# Patient Record
Sex: Male | Born: 1955 | Race: White | Hispanic: No | Marital: Married | Smoking: Never smoker
Health system: Southern US, Community
[De-identification: ages and names within clinical notes are randomized; demographics above are authoritative.]

## PROBLEM LIST (undated history)

## (undated) ENCOUNTER — Emergency Department (HOSPITAL_BASED_OUTPATIENT_CLINIC_OR_DEPARTMENT_OTHER): Payer: BC Managed Care – PPO | Source: Home / Self Care

## (undated) DIAGNOSIS — I82409 Acute embolism and thrombosis of unspecified deep veins of unspecified lower extremity: Secondary | ICD-10-CM

## (undated) DIAGNOSIS — S72002S Fracture of unspecified part of neck of left femur, sequela: Secondary | ICD-10-CM

## (undated) DIAGNOSIS — M869 Osteomyelitis, unspecified: Secondary | ICD-10-CM

## (undated) DIAGNOSIS — I749 Embolism and thrombosis of unspecified artery: Secondary | ICD-10-CM

## (undated) DIAGNOSIS — N186 End stage renal disease: Secondary | ICD-10-CM

## (undated) DIAGNOSIS — N529 Male erectile dysfunction, unspecified: Secondary | ICD-10-CM

## (undated) DIAGNOSIS — N12 Tubulo-interstitial nephritis, not specified as acute or chronic: Secondary | ICD-10-CM

## (undated) DIAGNOSIS — A419 Sepsis, unspecified organism: Secondary | ICD-10-CM

## (undated) DIAGNOSIS — L97221 Non-pressure chronic ulcer of left calf limited to breakdown of skin: Secondary | ICD-10-CM

## (undated) DIAGNOSIS — I10 Essential (primary) hypertension: Secondary | ICD-10-CM

## (undated) DIAGNOSIS — Z89519 Acquired absence of unspecified leg below knee: Secondary | ICD-10-CM

## (undated) DIAGNOSIS — I739 Peripheral vascular disease, unspecified: Secondary | ICD-10-CM

## (undated) DIAGNOSIS — Z87442 Personal history of urinary calculi: Secondary | ICD-10-CM

## (undated) DIAGNOSIS — Z8739 Personal history of other diseases of the musculoskeletal system and connective tissue: Secondary | ICD-10-CM

## (undated) DIAGNOSIS — M1990 Unspecified osteoarthritis, unspecified site: Secondary | ICD-10-CM

## (undated) DIAGNOSIS — M86262 Subacute osteomyelitis, left tibia and fibula: Secondary | ICD-10-CM

## (undated) DIAGNOSIS — N400 Enlarged prostate without lower urinary tract symptoms: Secondary | ICD-10-CM

## (undated) DIAGNOSIS — Z5189 Encounter for other specified aftercare: Secondary | ICD-10-CM

## (undated) DIAGNOSIS — K5730 Diverticulosis of large intestine without perforation or abscess without bleeding: Secondary | ICD-10-CM

## (undated) DIAGNOSIS — F419 Anxiety disorder, unspecified: Secondary | ICD-10-CM

## (undated) DIAGNOSIS — M5126 Other intervertebral disc displacement, lumbar region: Secondary | ICD-10-CM

## (undated) DIAGNOSIS — L03116 Cellulitis of left lower limb: Secondary | ICD-10-CM

## (undated) DIAGNOSIS — K859 Acute pancreatitis without necrosis or infection, unspecified: Secondary | ICD-10-CM

## (undated) DIAGNOSIS — K8020 Calculus of gallbladder without cholecystitis without obstruction: Secondary | ICD-10-CM

## (undated) DIAGNOSIS — G8929 Other chronic pain: Secondary | ICD-10-CM

## (undated) DIAGNOSIS — D649 Anemia, unspecified: Secondary | ICD-10-CM

## (undated) DIAGNOSIS — N189 Chronic kidney disease, unspecified: Secondary | ICD-10-CM

## (undated) DIAGNOSIS — E039 Hypothyroidism, unspecified: Secondary | ICD-10-CM

## (undated) DIAGNOSIS — IMO0001 Reserved for inherently not codable concepts without codable children: Secondary | ICD-10-CM

## (undated) DIAGNOSIS — N200 Calculus of kidney: Secondary | ICD-10-CM

## (undated) DIAGNOSIS — M009 Pyogenic arthritis, unspecified: Secondary | ICD-10-CM

## (undated) DIAGNOSIS — G4730 Sleep apnea, unspecified: Secondary | ICD-10-CM

## (undated) DIAGNOSIS — E785 Hyperlipidemia, unspecified: Secondary | ICD-10-CM

## (undated) DIAGNOSIS — I890 Lymphedema, not elsewhere classified: Secondary | ICD-10-CM

## (undated) DIAGNOSIS — J309 Allergic rhinitis, unspecified: Secondary | ICD-10-CM

## (undated) HISTORY — DX: Cellulitis of left lower limb: L03.116

## (undated) HISTORY — DX: Other intervertebral disc displacement, lumbar region: M51.26

## (undated) HISTORY — DX: Allergic rhinitis, unspecified: J30.9

## (undated) HISTORY — DX: Calculus of gallbladder without cholecystitis without obstruction: K80.20

## (undated) HISTORY — PX: KNEE SURGERY: SHX244

## (undated) HISTORY — DX: Diverticulosis of large intestine without perforation or abscess without bleeding: K57.30

## (undated) HISTORY — DX: Sepsis, unspecified organism: A41.9

## (undated) HISTORY — PX: KIDNEY SURGERY: SHX687

## (undated) HISTORY — DX: Non-pressure chronic ulcer of left calf limited to breakdown of skin: L97.221

## (undated) HISTORY — DX: Male erectile dysfunction, unspecified: N52.9

## (undated) HISTORY — DX: Pyogenic arthritis, unspecified: M00.9

## (undated) HISTORY — PX: OTHER SURGICAL HISTORY: SHX169

## (undated) HISTORY — DX: Personal history of other diseases of the musculoskeletal system and connective tissue: Z87.39

## (undated) HISTORY — DX: Personal history of urinary calculi: Z87.442

## (undated) HISTORY — DX: Benign prostatic hyperplasia without lower urinary tract symptoms: N40.0

## (undated) HISTORY — DX: Acute embolism and thrombosis of unspecified deep veins of unspecified lower extremity: I82.409

## (undated) HISTORY — DX: Subacute osteomyelitis, left tibia and fibula: M86.262

## (undated) HISTORY — PX: LEG AMPUTATION BELOW KNEE: SHX694

## (undated) HISTORY — PX: TONSILLECTOMY: SUR1361

## (undated) HISTORY — DX: Acute pancreatitis without necrosis or infection, unspecified: K85.90

## (undated) HISTORY — DX: Embolism and thrombosis of unspecified artery: I74.9

## (undated) HISTORY — PX: REPLACEMENT TOTAL KNEE: SUR1224

## (undated) HISTORY — DX: Lymphedema, not elsewhere classified: I89.0

## (undated) HISTORY — DX: Fracture of unspecified part of neck of left femur, sequela: S72.002S

## (undated) HISTORY — DX: Acquired absence of unspecified leg below knee: Z89.519

---
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
## 1997-07-13 ENCOUNTER — Emergency Department (HOSPITAL_COMMUNITY): Admission: EM | Admit: 1997-07-13 | Discharge: 1997-07-13 | Payer: Self-pay | Admitting: Emergency Medicine

## 1998-09-22 ENCOUNTER — Emergency Department (HOSPITAL_COMMUNITY): Admission: EM | Admit: 1998-09-22 | Discharge: 1998-09-22 | Payer: Self-pay | Admitting: Emergency Medicine

## 2000-08-29 ENCOUNTER — Emergency Department (HOSPITAL_COMMUNITY): Admission: EM | Admit: 2000-08-29 | Discharge: 2000-08-29 | Payer: Self-pay | Admitting: Emergency Medicine

## 2000-08-29 ENCOUNTER — Encounter: Payer: Self-pay | Admitting: *Deleted

## 2002-03-06 ENCOUNTER — Encounter: Payer: Self-pay | Admitting: Orthopedic Surgery

## 2002-03-07 ENCOUNTER — Encounter: Payer: Self-pay | Admitting: Orthopedic Surgery

## 2002-03-07 ENCOUNTER — Inpatient Hospital Stay (HOSPITAL_COMMUNITY): Admission: RE | Admit: 2002-03-07 | Discharge: 2002-03-13 | Payer: Self-pay | Admitting: Orthopedic Surgery

## 2002-03-13 ENCOUNTER — Inpatient Hospital Stay (HOSPITAL_COMMUNITY)
Admission: RE | Admit: 2002-03-13 | Discharge: 2002-03-21 | Payer: Self-pay | Admitting: Physical Medicine & Rehabilitation

## 2002-07-17 ENCOUNTER — Encounter: Payer: Self-pay | Admitting: Orthopedic Surgery

## 2002-07-17 ENCOUNTER — Inpatient Hospital Stay (HOSPITAL_COMMUNITY): Admission: RE | Admit: 2002-07-17 | Discharge: 2002-07-25 | Payer: Self-pay | Admitting: Orthopedic Surgery

## 2003-03-21 ENCOUNTER — Emergency Department (HOSPITAL_COMMUNITY): Admission: EM | Admit: 2003-03-21 | Discharge: 2003-03-21 | Payer: Self-pay | Admitting: *Deleted

## 2003-03-22 ENCOUNTER — Emergency Department (HOSPITAL_COMMUNITY): Admission: EM | Admit: 2003-03-22 | Discharge: 2003-03-22 | Payer: Self-pay | Admitting: *Deleted

## 2003-03-23 ENCOUNTER — Encounter (HOSPITAL_COMMUNITY): Admission: RE | Admit: 2003-03-23 | Discharge: 2003-06-21 | Payer: Self-pay | Admitting: Internal Medicine

## 2004-09-01 ENCOUNTER — Ambulatory Visit (HOSPITAL_COMMUNITY): Admission: RE | Admit: 2004-09-01 | Discharge: 2004-09-01 | Payer: Self-pay | Admitting: Family Medicine

## 2004-10-26 ENCOUNTER — Ambulatory Visit (HOSPITAL_COMMUNITY): Admission: RE | Admit: 2004-10-26 | Discharge: 2004-10-26 | Payer: Self-pay | Admitting: Family Medicine

## 2005-07-17 ENCOUNTER — Emergency Department (HOSPITAL_COMMUNITY): Admission: EM | Admit: 2005-07-17 | Discharge: 2005-07-17 | Payer: Self-pay | Admitting: Family Medicine

## 2005-12-18 ENCOUNTER — Inpatient Hospital Stay (HOSPITAL_COMMUNITY): Admission: AD | Admit: 2005-12-18 | Discharge: 2005-12-25 | Payer: Self-pay | Admitting: Family Medicine

## 2005-12-20 ENCOUNTER — Encounter: Payer: Self-pay | Admitting: Internal Medicine

## 2005-12-21 ENCOUNTER — Ambulatory Visit: Payer: Self-pay | Admitting: Internal Medicine

## 2007-03-04 ENCOUNTER — Encounter: Admission: RE | Admit: 2007-03-04 | Discharge: 2007-03-04 | Payer: Self-pay | Admitting: Occupational Medicine

## 2007-03-12 ENCOUNTER — Encounter: Admission: RE | Admit: 2007-03-12 | Discharge: 2007-03-12 | Payer: Self-pay | Admitting: Internal Medicine

## 2007-08-15 ENCOUNTER — Ambulatory Visit (HOSPITAL_COMMUNITY): Admission: RE | Admit: 2007-08-15 | Discharge: 2007-08-15 | Payer: Self-pay | Admitting: Urology

## 2007-08-15 ENCOUNTER — Encounter (INDEPENDENT_AMBULATORY_CARE_PROVIDER_SITE_OTHER): Payer: Self-pay | Admitting: Interventional Radiology

## 2007-10-01 ENCOUNTER — Encounter (INDEPENDENT_AMBULATORY_CARE_PROVIDER_SITE_OTHER): Payer: Self-pay | Admitting: *Deleted

## 2007-10-01 ENCOUNTER — Telehealth (INDEPENDENT_AMBULATORY_CARE_PROVIDER_SITE_OTHER): Payer: Self-pay | Admitting: *Deleted

## 2007-10-01 ENCOUNTER — Ambulatory Visit: Payer: Self-pay | Admitting: Gastroenterology

## 2007-10-01 DIAGNOSIS — E1121 Type 2 diabetes mellitus with diabetic nephropathy: Secondary | ICD-10-CM

## 2007-10-01 DIAGNOSIS — I82409 Acute embolism and thrombosis of unspecified deep veins of unspecified lower extremity: Secondary | ICD-10-CM | POA: Insufficient documentation

## 2007-10-01 DIAGNOSIS — Z794 Long term (current) use of insulin: Secondary | ICD-10-CM

## 2007-10-03 ENCOUNTER — Encounter (INDEPENDENT_AMBULATORY_CARE_PROVIDER_SITE_OTHER): Payer: Self-pay | Admitting: *Deleted

## 2007-10-07 ENCOUNTER — Telehealth (INDEPENDENT_AMBULATORY_CARE_PROVIDER_SITE_OTHER): Payer: Self-pay | Admitting: *Deleted

## 2007-10-16 ENCOUNTER — Telehealth (INDEPENDENT_AMBULATORY_CARE_PROVIDER_SITE_OTHER): Payer: Self-pay | Admitting: *Deleted

## 2007-10-22 ENCOUNTER — Telehealth: Payer: Self-pay | Admitting: Gastroenterology

## 2007-11-15 ENCOUNTER — Emergency Department (HOSPITAL_COMMUNITY): Admission: EM | Admit: 2007-11-15 | Discharge: 2007-11-16 | Payer: Self-pay | Admitting: Emergency Medicine

## 2007-11-16 ENCOUNTER — Inpatient Hospital Stay (HOSPITAL_COMMUNITY): Admission: EM | Admit: 2007-11-16 | Discharge: 2007-11-22 | Payer: Self-pay | Admitting: Internal Medicine

## 2008-01-15 ENCOUNTER — Inpatient Hospital Stay (HOSPITAL_COMMUNITY): Admission: RE | Admit: 2008-01-15 | Discharge: 2008-01-21 | Payer: Self-pay | Admitting: Orthopedic Surgery

## 2008-02-18 ENCOUNTER — Ambulatory Visit: Payer: Self-pay

## 2008-02-19 ENCOUNTER — Emergency Department (HOSPITAL_COMMUNITY): Admission: EM | Admit: 2008-02-19 | Discharge: 2008-02-20 | Payer: Self-pay | Admitting: Emergency Medicine

## 2008-03-04 ENCOUNTER — Ambulatory Visit: Payer: Self-pay | Admitting: Oncology

## 2008-03-10 LAB — CBC WITH DIFFERENTIAL (CANCER CENTER ONLY)
BASO#: 0 10*3/uL (ref 0.0–0.2)
BASO%: 0.3 % (ref 0.0–2.0)
HCT: 27.5 % — ABNORMAL LOW (ref 38.7–49.9)
LYMPH%: 12.2 % — ABNORMAL LOW (ref 14.0–48.0)
MCV: 73 fL — ABNORMAL LOW (ref 82–98)
MONO#: 0.5 10*3/uL (ref 0.1–0.9)
NEUT%: 74.4 % (ref 40.0–80.0)
RDW: 16.1 % — ABNORMAL HIGH (ref 10.5–14.6)
WBC: 5 10*3/uL (ref 4.0–10.0)

## 2008-03-10 LAB — CMP (CANCER CENTER ONLY)
ALT(SGPT): 20 U/L (ref 10–47)
CO2: 30 mEq/L (ref 18–33)
Calcium: 9.4 mg/dL (ref 8.0–10.3)
Chloride: 99 mEq/L (ref 98–108)
Sodium: 140 mEq/L (ref 128–145)
Total Bilirubin: 0.8 mg/dl (ref 0.20–1.60)
Total Protein: 6.9 g/dL (ref 6.4–8.1)

## 2008-03-10 LAB — LACTATE DEHYDROGENASE: LDH: 175 U/L (ref 94–250)

## 2008-03-13 ENCOUNTER — Ambulatory Visit (HOSPITAL_COMMUNITY): Admission: RE | Admit: 2008-03-13 | Discharge: 2008-03-13 | Payer: Self-pay | Admitting: Oncology

## 2008-03-16 ENCOUNTER — Inpatient Hospital Stay (HOSPITAL_COMMUNITY): Admission: EM | Admit: 2008-03-16 | Discharge: 2008-03-19 | Payer: Self-pay | Admitting: Emergency Medicine

## 2008-03-16 ENCOUNTER — Ambulatory Visit: Payer: Self-pay | Admitting: Cardiology

## 2008-03-17 ENCOUNTER — Encounter (INDEPENDENT_AMBULATORY_CARE_PROVIDER_SITE_OTHER): Payer: Self-pay | Admitting: Internal Medicine

## 2008-03-23 ENCOUNTER — Ambulatory Visit (HOSPITAL_COMMUNITY): Admission: RE | Admit: 2008-03-23 | Discharge: 2008-03-23 | Payer: Self-pay | Admitting: Oncology

## 2008-03-23 ENCOUNTER — Encounter: Payer: Self-pay | Admitting: Oncology

## 2008-03-23 ENCOUNTER — Encounter (INDEPENDENT_AMBULATORY_CARE_PROVIDER_SITE_OTHER): Payer: Self-pay | Admitting: Diagnostic Radiology

## 2008-03-27 ENCOUNTER — Telehealth: Payer: Self-pay | Admitting: Gastroenterology

## 2008-03-30 ENCOUNTER — Encounter: Payer: Self-pay | Admitting: Gastroenterology

## 2008-03-30 ENCOUNTER — Encounter (INDEPENDENT_AMBULATORY_CARE_PROVIDER_SITE_OTHER): Payer: Self-pay | Admitting: *Deleted

## 2008-03-31 ENCOUNTER — Telehealth (INDEPENDENT_AMBULATORY_CARE_PROVIDER_SITE_OTHER): Payer: Self-pay | Admitting: *Deleted

## 2008-04-07 ENCOUNTER — Ambulatory Visit: Payer: Self-pay | Admitting: Gastroenterology

## 2008-04-07 ENCOUNTER — Telehealth (INDEPENDENT_AMBULATORY_CARE_PROVIDER_SITE_OTHER): Payer: Self-pay | Admitting: *Deleted

## 2008-04-16 ENCOUNTER — Ambulatory Visit: Payer: Self-pay | Admitting: Gastroenterology

## 2008-04-16 ENCOUNTER — Ambulatory Visit (HOSPITAL_COMMUNITY): Admission: RE | Admit: 2008-04-16 | Discharge: 2008-04-16 | Payer: Self-pay | Admitting: Gastroenterology

## 2008-08-12 ENCOUNTER — Emergency Department (HOSPITAL_COMMUNITY): Admission: EM | Admit: 2008-08-12 | Discharge: 2008-08-12 | Payer: Self-pay | Admitting: Emergency Medicine

## 2008-08-26 ENCOUNTER — Encounter: Admission: RE | Admit: 2008-08-26 | Discharge: 2008-08-26 | Payer: Self-pay | Admitting: Orthopedic Surgery

## 2008-09-14 ENCOUNTER — Emergency Department (HOSPITAL_BASED_OUTPATIENT_CLINIC_OR_DEPARTMENT_OTHER): Admission: EM | Admit: 2008-09-14 | Discharge: 2008-09-14 | Payer: Self-pay | Admitting: Emergency Medicine

## 2008-09-16 ENCOUNTER — Inpatient Hospital Stay (HOSPITAL_COMMUNITY): Admission: EM | Admit: 2008-09-16 | Discharge: 2008-09-21 | Payer: Self-pay | Admitting: Emergency Medicine

## 2008-09-18 ENCOUNTER — Encounter: Admission: RE | Admit: 2008-09-18 | Discharge: 2008-09-18 | Payer: Self-pay | Admitting: *Deleted

## 2008-09-21 ENCOUNTER — Ambulatory Visit: Payer: Self-pay | Admitting: Oncology

## 2008-09-25 ENCOUNTER — Ambulatory Visit (HOSPITAL_COMMUNITY): Admission: RE | Admit: 2008-09-25 | Discharge: 2008-09-25 | Payer: Self-pay | Admitting: Oncology

## 2008-10-01 ENCOUNTER — Encounter: Admission: RE | Admit: 2008-10-01 | Discharge: 2008-10-01 | Payer: Self-pay | Admitting: Orthopedic Surgery

## 2008-10-10 ENCOUNTER — Inpatient Hospital Stay (HOSPITAL_COMMUNITY): Admission: EM | Admit: 2008-10-10 | Discharge: 2008-10-18 | Payer: Self-pay | Admitting: Emergency Medicine

## 2008-10-14 ENCOUNTER — Ambulatory Visit: Payer: Self-pay | Admitting: Vascular Surgery

## 2008-10-14 ENCOUNTER — Encounter (INDEPENDENT_AMBULATORY_CARE_PROVIDER_SITE_OTHER): Payer: Self-pay | Admitting: Internal Medicine

## 2008-10-16 ENCOUNTER — Encounter (INDEPENDENT_AMBULATORY_CARE_PROVIDER_SITE_OTHER): Payer: Self-pay | Admitting: Internal Medicine

## 2008-10-16 ENCOUNTER — Ambulatory Visit: Payer: Self-pay | Admitting: Internal Medicine

## 2008-10-26 ENCOUNTER — Encounter (HOSPITAL_BASED_OUTPATIENT_CLINIC_OR_DEPARTMENT_OTHER): Admission: RE | Admit: 2008-10-26 | Discharge: 2009-01-24 | Payer: Self-pay | Admitting: Internal Medicine

## 2008-11-06 ENCOUNTER — Encounter: Admission: RE | Admit: 2008-11-06 | Discharge: 2008-11-06 | Payer: Self-pay | Admitting: Orthopedic Surgery

## 2008-11-18 ENCOUNTER — Emergency Department (HOSPITAL_COMMUNITY): Admission: EM | Admit: 2008-11-18 | Discharge: 2008-11-18 | Payer: Self-pay | Admitting: Emergency Medicine

## 2008-11-25 ENCOUNTER — Observation Stay (HOSPITAL_COMMUNITY): Admission: EM | Admit: 2008-11-25 | Discharge: 2008-11-27 | Payer: Self-pay | Admitting: Emergency Medicine

## 2008-11-30 ENCOUNTER — Encounter: Admission: RE | Admit: 2008-11-30 | Discharge: 2008-11-30 | Payer: Self-pay | Admitting: Orthopaedic Surgery

## 2008-12-03 ENCOUNTER — Encounter: Admission: RE | Admit: 2008-12-03 | Discharge: 2008-12-03 | Payer: Self-pay | Admitting: Family Medicine

## 2008-12-18 ENCOUNTER — Inpatient Hospital Stay (HOSPITAL_COMMUNITY): Admission: RE | Admit: 2008-12-18 | Discharge: 2008-12-21 | Payer: Self-pay | Admitting: Orthopedic Surgery

## 2008-12-18 ENCOUNTER — Encounter (INDEPENDENT_AMBULATORY_CARE_PROVIDER_SITE_OTHER): Payer: Self-pay | Admitting: Orthopedic Surgery

## 2009-01-26 ENCOUNTER — Encounter (HOSPITAL_BASED_OUTPATIENT_CLINIC_OR_DEPARTMENT_OTHER): Admission: RE | Admit: 2009-01-26 | Discharge: 2009-02-26 | Payer: Self-pay | Admitting: Internal Medicine

## 2009-02-24 ENCOUNTER — Ambulatory Visit (HOSPITAL_COMMUNITY): Admission: RE | Admit: 2009-02-24 | Discharge: 2009-02-24 | Payer: Self-pay | Admitting: Family Medicine

## 2009-03-01 ENCOUNTER — Encounter (HOSPITAL_BASED_OUTPATIENT_CLINIC_OR_DEPARTMENT_OTHER): Admission: RE | Admit: 2009-03-01 | Discharge: 2009-05-30 | Payer: Self-pay | Admitting: Internal Medicine

## 2009-03-03 ENCOUNTER — Encounter: Admission: RE | Admit: 2009-03-03 | Discharge: 2009-04-27 | Payer: Self-pay | Admitting: Orthopedic Surgery

## 2009-06-02 ENCOUNTER — Encounter (HOSPITAL_BASED_OUTPATIENT_CLINIC_OR_DEPARTMENT_OTHER): Admission: RE | Admit: 2009-06-02 | Discharge: 2009-08-31 | Payer: Self-pay | Admitting: Internal Medicine

## 2009-09-01 ENCOUNTER — Encounter (HOSPITAL_BASED_OUTPATIENT_CLINIC_OR_DEPARTMENT_OTHER): Admission: RE | Admit: 2009-09-01 | Discharge: 2009-11-29 | Payer: Self-pay | Admitting: Internal Medicine

## 2009-09-16 ENCOUNTER — Telehealth: Payer: Self-pay | Admitting: Gastroenterology

## 2009-09-21 ENCOUNTER — Inpatient Hospital Stay (HOSPITAL_COMMUNITY): Admission: RE | Admit: 2009-09-21 | Discharge: 2009-09-24 | Payer: Self-pay | Admitting: Emergency Medicine

## 2009-09-21 ENCOUNTER — Ambulatory Visit: Payer: Self-pay | Admitting: Gastroenterology

## 2009-09-21 ENCOUNTER — Encounter: Payer: Self-pay | Admitting: Gastroenterology

## 2009-09-21 DIAGNOSIS — K921 Melena: Secondary | ICD-10-CM | POA: Insufficient documentation

## 2009-09-21 DIAGNOSIS — D62 Acute posthemorrhagic anemia: Secondary | ICD-10-CM | POA: Insufficient documentation

## 2009-09-21 DIAGNOSIS — K5730 Diverticulosis of large intestine without perforation or abscess without bleeding: Secondary | ICD-10-CM | POA: Insufficient documentation

## 2009-09-21 DIAGNOSIS — I10 Essential (primary) hypertension: Secondary | ICD-10-CM | POA: Insufficient documentation

## 2009-09-21 DIAGNOSIS — E669 Obesity, unspecified: Secondary | ICD-10-CM

## 2009-09-21 DIAGNOSIS — E785 Hyperlipidemia, unspecified: Secondary | ICD-10-CM | POA: Insufficient documentation

## 2009-09-21 DIAGNOSIS — D649 Anemia, unspecified: Secondary | ICD-10-CM | POA: Insufficient documentation

## 2009-09-22 ENCOUNTER — Encounter (INDEPENDENT_AMBULATORY_CARE_PROVIDER_SITE_OTHER): Payer: Self-pay | Admitting: Internal Medicine

## 2009-09-22 ENCOUNTER — Ambulatory Visit: Payer: Self-pay | Admitting: Vascular Surgery

## 2009-09-22 ENCOUNTER — Ambulatory Visit: Payer: Self-pay | Admitting: Internal Medicine

## 2009-09-22 LAB — CONVERTED CEMR LAB
BUN: 43 mg/dL — ABNORMAL HIGH (ref 6–23)
Eosinophils Relative: 1.9 % (ref 0.0–5.0)
INR: 15.3 (ref 0.8–1.0)
Lymphocytes Relative: 13.3 % (ref 12.0–46.0)
Monocytes Relative: 9.3 % (ref 3.0–12.0)
Neutrophils Relative %: 74.8 % (ref 43.0–77.0)
Platelets: 342 10*3/uL (ref 150.0–400.0)
RBC: 2.67 M/uL — ABNORMAL LOW (ref 4.22–5.81)
Saturation Ratios: 13.5 % — ABNORMAL LOW (ref 20.0–50.0)
Transferrin: 201.1 mg/dL — ABNORMAL LOW (ref 212.0–360.0)
WBC: 5.4 10*3/uL (ref 4.5–10.5)

## 2009-09-23 ENCOUNTER — Encounter: Payer: Self-pay | Admitting: Internal Medicine

## 2009-09-29 ENCOUNTER — Ambulatory Visit: Payer: Self-pay | Admitting: Oncology

## 2009-10-04 IMAGING — CR DG TIBIA/FIBULA PORT 2V*R*
2 series · 2 of 2 positions shown · non-contrast
Comparison: None

CLINICAL DATA: Wound on the right lower extremity.

PORTABLE RIGHT TIBIA AND FIBULA - 2 VIEW

[view not recorded (1 of 2)]
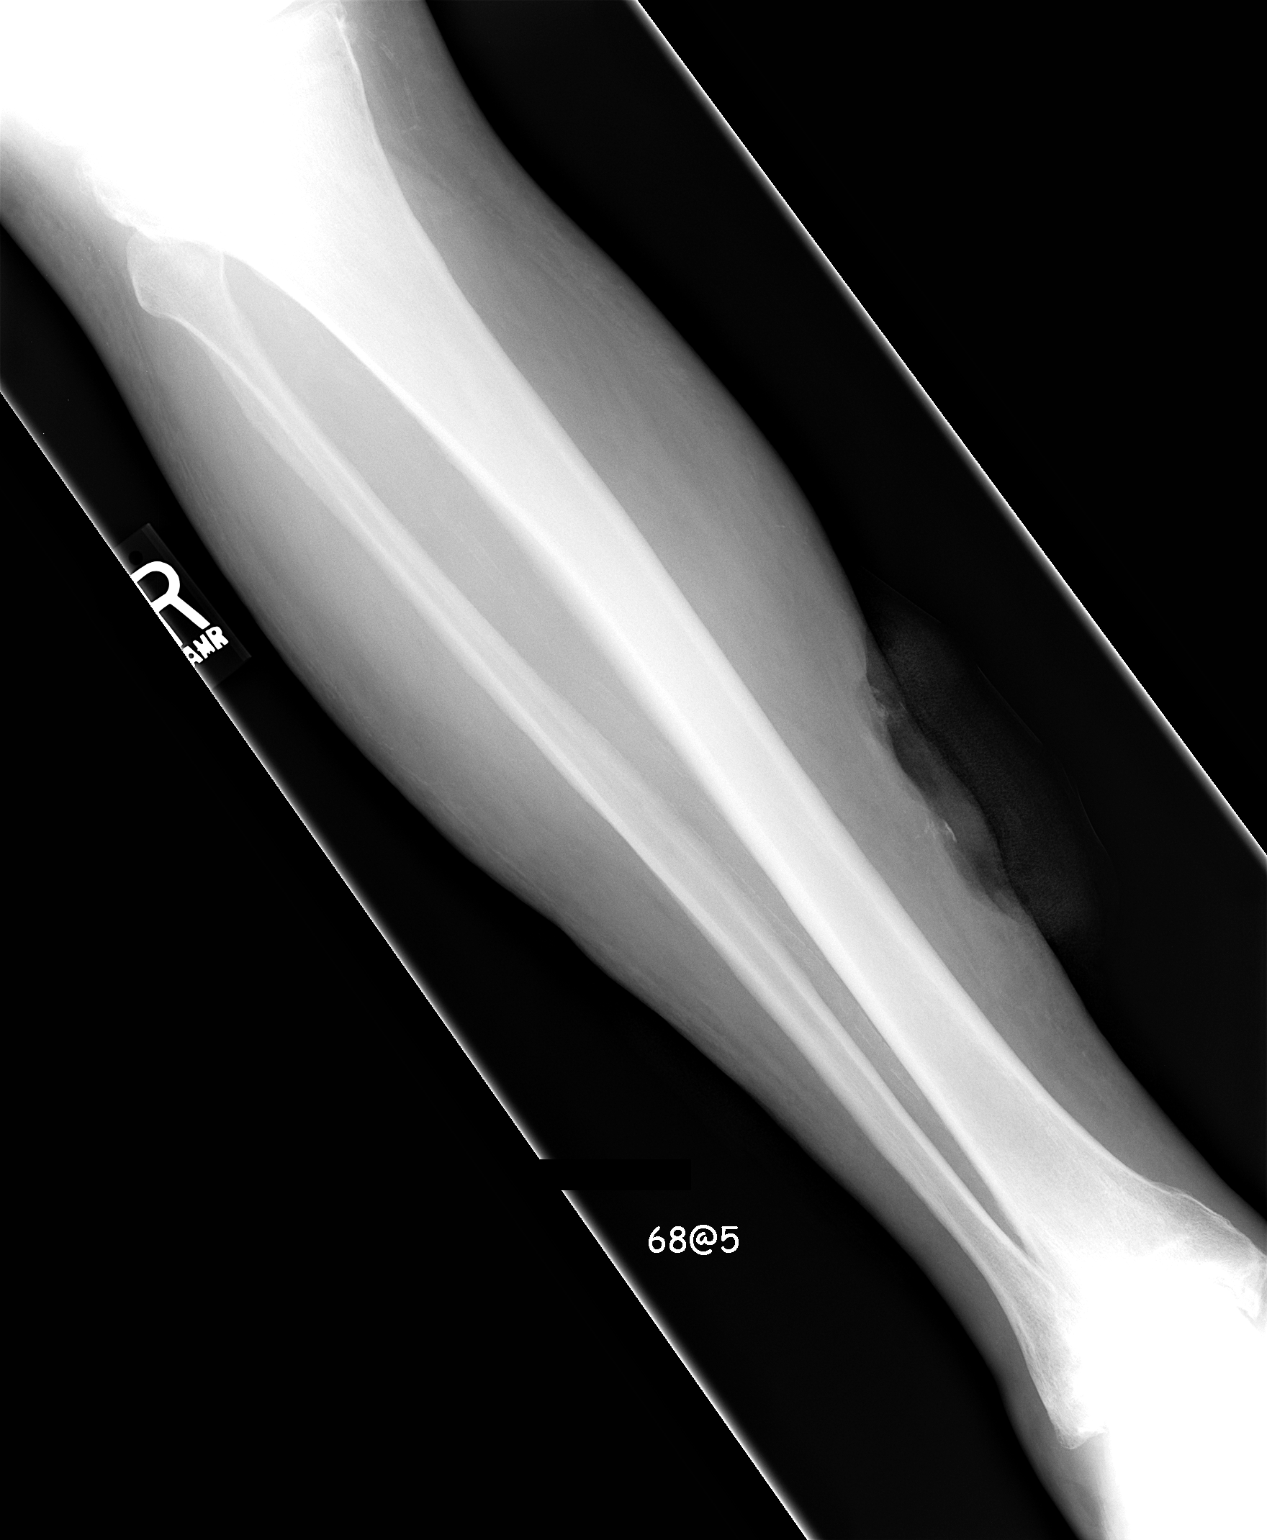

[view not recorded (2 of 2)]
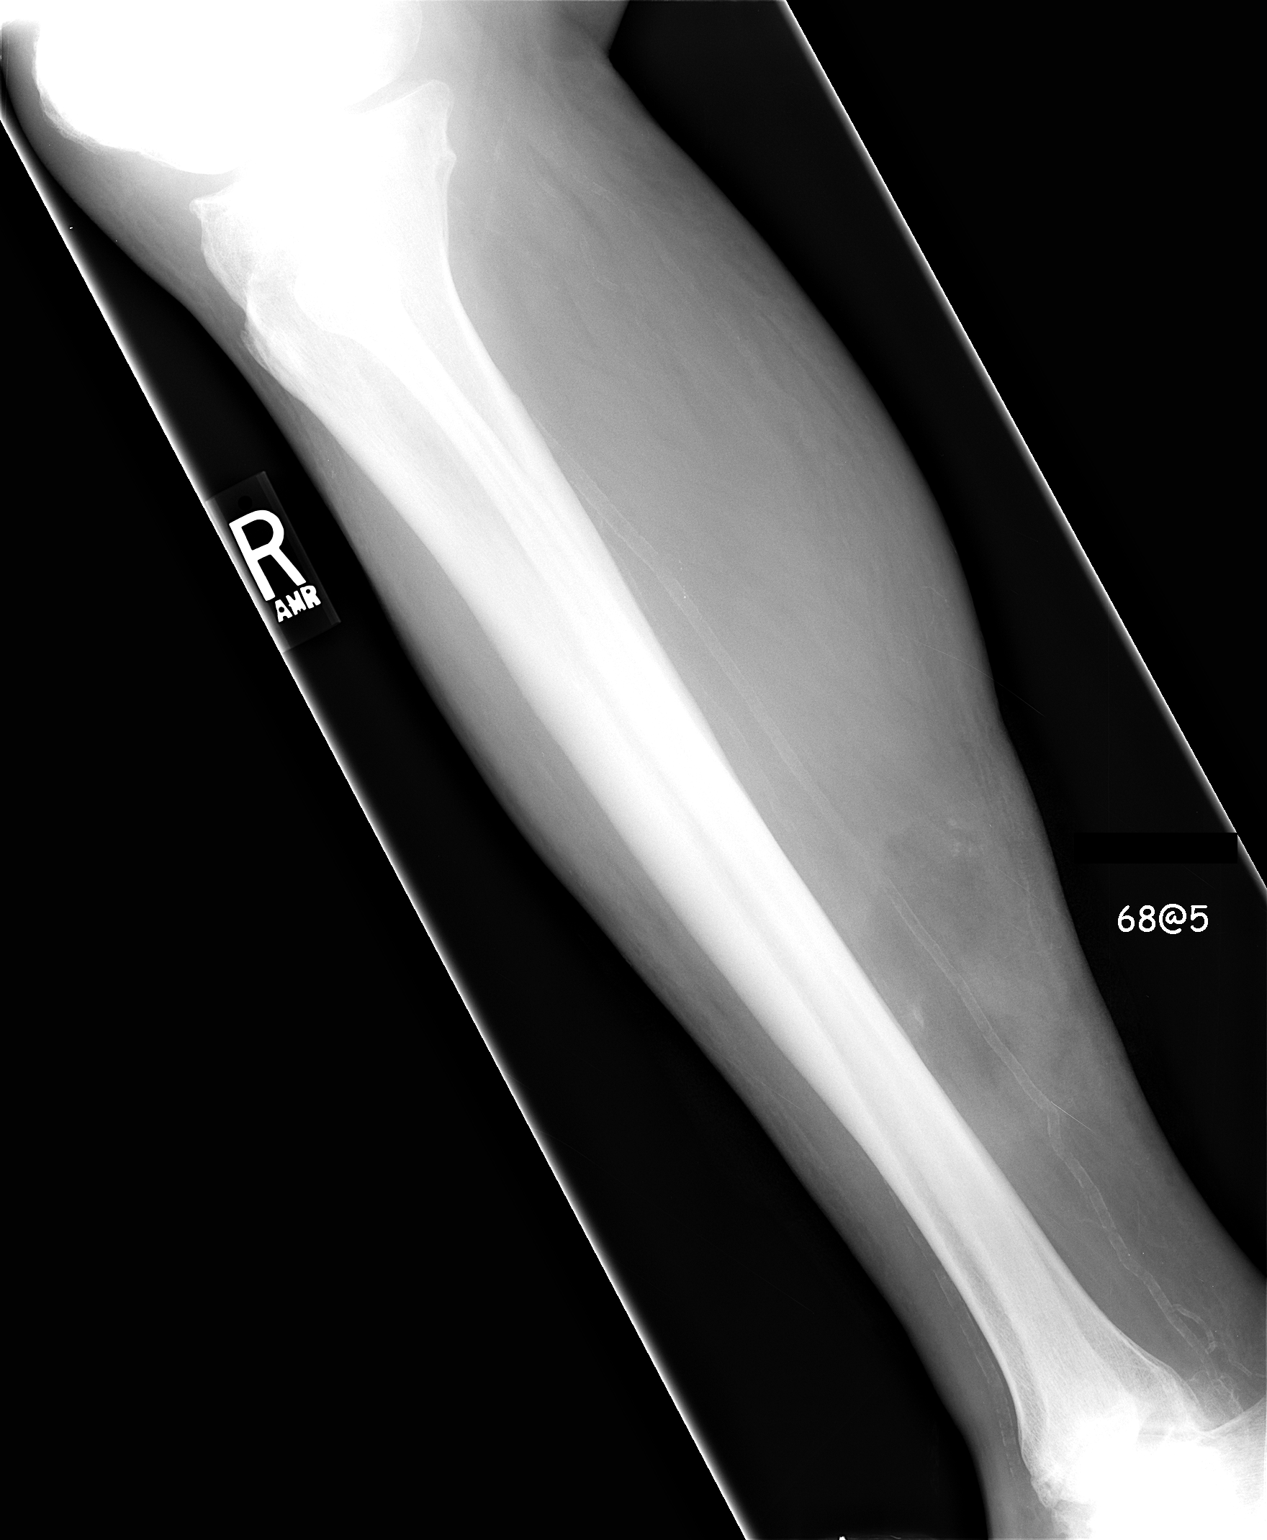

[2 of 2 positions shown; findings below may reference images not displayed]

FINDINGS: There is an open wound noted on the medial aspect of the
right lower extremity. No destructive bony changes to suggest
osteomyelitis.  Vascular calcifications are noted.
IMPRESSION: 1.  Open wound and probable changes of cellulitis.
2.  No plain film findings for osteomyelitis.

## 2009-10-27 ENCOUNTER — Emergency Department (HOSPITAL_COMMUNITY): Admission: EM | Admit: 2009-10-27 | Discharge: 2009-10-28 | Payer: Self-pay | Admitting: Emergency Medicine

## 2009-12-02 ENCOUNTER — Encounter (HOSPITAL_BASED_OUTPATIENT_CLINIC_OR_DEPARTMENT_OTHER): Admission: RE | Admit: 2009-12-02 | Discharge: 2009-12-24 | Payer: Self-pay | Admitting: Internal Medicine

## 2009-12-03 ENCOUNTER — Inpatient Hospital Stay (HOSPITAL_COMMUNITY): Admission: RE | Admit: 2009-12-03 | Discharge: 2009-12-13 | Payer: Self-pay | Admitting: Orthopedic Surgery

## 2009-12-07 ENCOUNTER — Ambulatory Visit: Payer: Self-pay | Admitting: Infectious Diseases

## 2009-12-15 ENCOUNTER — Encounter: Payer: Self-pay | Admitting: Infectious Diseases

## 2010-01-04 ENCOUNTER — Encounter: Payer: Self-pay | Admitting: Infectious Diseases

## 2010-01-11 ENCOUNTER — Encounter: Payer: Self-pay | Admitting: Infectious Diseases

## 2010-01-18 ENCOUNTER — Encounter: Payer: Self-pay | Admitting: Infectious Diseases

## 2010-02-10 ENCOUNTER — Encounter: Payer: Self-pay | Admitting: Infectious Diseases

## 2010-02-17 ENCOUNTER — Encounter
Admission: RE | Admit: 2010-02-17 | Discharge: 2010-02-17 | Payer: Self-pay | Source: Home / Self Care | Attending: Orthopedic Surgery | Admitting: Orthopedic Surgery

## 2010-03-03 LAB — COMPREHENSIVE METABOLIC PANEL
ALT: 19 U/L (ref 0–53)
AST: 19 U/L (ref 0–37)
Albumin: 3.1 g/dL — ABNORMAL LOW (ref 3.5–5.2)
Alkaline Phosphatase: 72 U/L (ref 39–117)
BUN: 17 mg/dL (ref 6–23)
CO2: 26 mEq/L (ref 19–32)
Calcium: 9.3 mg/dL (ref 8.4–10.5)
Chloride: 106 mEq/L (ref 96–112)
Creatinine, Ser: 1.08 mg/dL (ref 0.4–1.5)
GFR calc Af Amer: >60 mL/min (ref >60)
GFR calc non Af Amer: >60 mL/min (ref >60)
Glucose, Bld: 242 mg/dL — ABNORMAL HIGH (ref 70–99)
Potassium: 4.8 mEq/L (ref 3.5–5.1)
Sodium: 139 mEq/L (ref 135–145)
Total Bilirubin: 0.7 mg/dL (ref 0.3–1.2)
Total Protein: 6.6 g/dL (ref 6.0–8.3)

## 2010-03-03 LAB — APTT: aPTT: 47 seconds — ABNORMAL HIGH (ref 24–37)

## 2010-03-03 LAB — CBC
HCT: 29.3 % — ABNORMAL LOW (ref 39.0–52.0)
Hemoglobin: 9.1 g/dL — ABNORMAL LOW (ref 13.0–17.0)
MCH: 26 pg (ref 26.0–34.0)
MCHC: 31.1 g/dL (ref 30.0–36.0)
MCV: 83.7 fL (ref 78.0–100.0)
Platelets: 148 10*3/uL — ABNORMAL LOW (ref 150–400)
RBC: 3.5 MIL/uL — ABNORMAL LOW (ref 4.22–5.81)
RDW: 17.4 % — ABNORMAL HIGH (ref 11.5–15.5)
WBC: 4 10*3/uL (ref 4.0–10.5)

## 2010-03-03 LAB — PROTIME-INR
INR: 2.94 — ABNORMAL HIGH (ref 0.00–1.49)
Prothrombin Time: 30.7 seconds — ABNORMAL HIGH (ref 11.6–15.2)

## 2010-03-04 ENCOUNTER — Inpatient Hospital Stay (HOSPITAL_COMMUNITY)
Admission: RE | Admit: 2010-03-04 | Discharge: 2010-03-08 | Payer: Self-pay | Source: Home / Self Care | Attending: Orthopedic Surgery | Admitting: Orthopedic Surgery

## 2010-03-04 LAB — GLUCOSE, CAPILLARY
Glucose-Capillary: 169 mg/dL — ABNORMAL HIGH (ref 70–99)
Glucose-Capillary: 143 mg/dL — ABNORMAL HIGH (ref 70–99)

## 2010-03-14 LAB — CBC
HCT: 25.7 % — ABNORMAL LOW (ref 39.0–52.0)
HCT: 25.2 % — ABNORMAL LOW (ref 39.0–52.0)
HCT: 25.8 % — ABNORMAL LOW (ref 39.0–52.0)
HCT: 28 % — ABNORMAL LOW (ref 39.0–52.0)
Hemoglobin: 7.8 g/dL — ABNORMAL LOW (ref 13.0–17.0)
Hemoglobin: 7.7 g/dL — ABNORMAL LOW (ref 13.0–17.0)
Hemoglobin: 7.8 g/dL — ABNORMAL LOW (ref 13.0–17.0)
Hemoglobin: 8.6 g/dL — ABNORMAL LOW (ref 13.0–17.0)
MCH: 25.3 pg — ABNORMAL LOW (ref 26.0–34.0)
MCH: 25.4 pg — ABNORMAL LOW (ref 26.0–34.0)
MCH: 25.2 pg — ABNORMAL LOW (ref 26.0–34.0)
MCH: 25.6 pg — ABNORMAL LOW (ref 26.0–34.0)
MCHC: 30.4 g/dL (ref 30.0–36.0)
MCHC: 30.6 g/dL (ref 30.0–36.0)
MCHC: 30.2 g/dL (ref 30.0–36.0)
MCHC: 30.7 g/dL (ref 30.0–36.0)
MCV: 83.4 fL (ref 78.0–100.0)
MCV: 83.2 fL (ref 78.0–100.0)
MCV: 83.2 fL (ref 78.0–100.0)
MCV: 83.3 fL (ref 78.0–100.0)
Platelets: 145 10*3/uL — ABNORMAL LOW (ref 150–400)
Platelets: 140 10*3/uL — ABNORMAL LOW (ref 150–400)
Platelets: 158 10*3/uL (ref 150–400)
Platelets: 151 10*3/uL (ref 150–400)
RBC: 3.08 MIL/uL — ABNORMAL LOW (ref 4.22–5.81)
RBC: 3.03 MIL/uL — ABNORMAL LOW (ref 4.22–5.81)
RBC: 3.1 MIL/uL — ABNORMAL LOW (ref 4.22–5.81)
RBC: 3.36 MIL/uL — ABNORMAL LOW (ref 4.22–5.81)
RDW: 17.8 % — ABNORMAL HIGH (ref 11.5–15.5)
RDW: 18 % — ABNORMAL HIGH (ref 11.5–15.5)
RDW: 18 % — ABNORMAL HIGH (ref 11.5–15.5)
RDW: 17.8 % — ABNORMAL HIGH (ref 11.5–15.5)
WBC: 3.2 10*3/uL — ABNORMAL LOW (ref 4.0–10.5)
WBC: 3.4 10*3/uL — ABNORMAL LOW (ref 4.0–10.5)
WBC: 3.3 10*3/uL — ABNORMAL LOW (ref 4.0–10.5)
WBC: 3.3 10*3/uL — ABNORMAL LOW (ref 4.0–10.5)

## 2010-03-14 LAB — BASIC METABOLIC PANEL
BUN: 18 mg/dL (ref 6–23)
BUN: 20 mg/dL (ref 6–23)
BUN: 20 mg/dL (ref 6–23)
BUN: 21 mg/dL (ref 6–23)
CO2: 25 mEq/L (ref 19–32)
CO2: 26 mEq/L (ref 19–32)
CO2: 26 mEq/L (ref 19–32)
CO2: 29 mEq/L (ref 19–32)
Calcium: 9.2 mg/dL (ref 8.4–10.5)
Calcium: 9.3 mg/dL (ref 8.4–10.5)
Calcium: 9.7 mg/dL (ref 8.4–10.5)
Calcium: 9.9 mg/dL (ref 8.4–10.5)
Chloride: 102 mEq/L (ref 96–112)
Chloride: 104 mEq/L (ref 96–112)
Chloride: 102 mEq/L (ref 96–112)
Chloride: 98 mEq/L (ref 96–112)
Creatinine, Ser: 1.25 mg/dL (ref 0.4–1.5)
Creatinine, Ser: 1.39 mg/dL (ref 0.4–1.5)
Creatinine, Ser: 1.38 mg/dL (ref 0.4–1.5)
Creatinine, Ser: 1.35 mg/dL (ref 0.4–1.5)
GFR calc Af Amer: >60 mL/min (ref >60)
GFR calc Af Amer: >60 mL/min (ref >60)
GFR calc Af Amer: >60 mL/min (ref >60)
GFR calc Af Amer: >60 mL/min (ref >60)
GFR calc non Af Amer: >60 mL/min (ref >60)
GFR calc non Af Amer: 54 mL/min — ABNORMAL LOW (ref >60)
GFR calc non Af Amer: 54 mL/min — ABNORMAL LOW (ref >60)
GFR calc non Af Amer: 56 mL/min — ABNORMAL LOW (ref >60)
Glucose, Bld: 200 mg/dL — ABNORMAL HIGH (ref 70–99)
Glucose, Bld: 117 mg/dL — ABNORMAL HIGH (ref 70–99)
Glucose, Bld: 145 mg/dL — ABNORMAL HIGH (ref 70–99)
Glucose, Bld: 157 mg/dL — ABNORMAL HIGH (ref 70–99)
Potassium: 4.7 mEq/L (ref 3.5–5.1)
Potassium: 4.1 mEq/L (ref 3.5–5.1)
Potassium: 4.1 mEq/L (ref 3.5–5.1)
Potassium: 4.1 mEq/L (ref 3.5–5.1)
Sodium: 136 mEq/L (ref 135–145)
Sodium: 137 mEq/L (ref 135–145)
Sodium: 136 mEq/L (ref 135–145)
Sodium: 138 mEq/L (ref 135–145)

## 2010-03-14 LAB — ANAEROBIC CULTURE

## 2010-03-14 LAB — TISSUE CULTURE: Culture: NO GROWTH

## 2010-03-14 LAB — GLUCOSE, CAPILLARY
Glucose-Capillary: 159 mg/dL — ABNORMAL HIGH (ref 70–99)
Glucose-Capillary: 268 mg/dL — ABNORMAL HIGH (ref 70–99)
Glucose-Capillary: 190 mg/dL — ABNORMAL HIGH (ref 70–99)
Glucose-Capillary: 247 mg/dL — ABNORMAL HIGH (ref 70–99)
Glucose-Capillary: 185 mg/dL — ABNORMAL HIGH (ref 70–99)
Glucose-Capillary: 146 mg/dL — ABNORMAL HIGH (ref 70–99)
Glucose-Capillary: 119 mg/dL — ABNORMAL HIGH (ref 70–99)
Glucose-Capillary: 152 mg/dL — ABNORMAL HIGH (ref 70–99)
Glucose-Capillary: 171 mg/dL — ABNORMAL HIGH (ref 70–99)
Glucose-Capillary: 157 mg/dL — ABNORMAL HIGH (ref 70–99)
Glucose-Capillary: 171 mg/dL — ABNORMAL HIGH (ref 70–99)
Glucose-Capillary: 224 mg/dL — ABNORMAL HIGH (ref 70–99)
Glucose-Capillary: 162 mg/dL — ABNORMAL HIGH (ref 70–99)
Glucose-Capillary: 102 mg/dL — ABNORMAL HIGH (ref 70–99)
Glucose-Capillary: 150 mg/dL — ABNORMAL HIGH (ref 70–99)

## 2010-03-14 LAB — PROTIME-INR
INR: 2.73 — ABNORMAL HIGH (ref 0.00–1.49)
INR: 2.12 — ABNORMAL HIGH (ref 0.00–1.49)
INR: 2.26 — ABNORMAL HIGH (ref 0.00–1.49)
Prothrombin Time: 29 seconds — ABNORMAL HIGH (ref 11.6–15.2)
Prothrombin Time: 23.9 seconds — ABNORMAL HIGH (ref 11.6–15.2)
Prothrombin Time: 25.1 seconds — ABNORMAL HIGH (ref 11.6–15.2)

## 2010-03-14 LAB — HEMOGLOBIN A1C
Hgb A1c MFr Bld: 7.5 % — ABNORMAL HIGH (ref <5.7)
Mean Plasma Glucose: 169 mg/dL — ABNORMAL HIGH (ref <117)

## 2010-03-14 LAB — VANCOMYCIN, TROUGH: Vancomycin Tr: 38.6 ug/mL (ref 10.0–20.0)

## 2010-03-21 ENCOUNTER — Encounter: Payer: Self-pay | Admitting: Orthopedic Surgery

## 2010-03-21 ENCOUNTER — Encounter: Payer: Self-pay | Admitting: Orthopaedic Surgery

## 2010-03-29 NOTE — Procedures (Signed)
Summary: Upper Endoscopy  Patient: [name] Note: All result statuses are Final unless otherwise noted.  Tests: (1) Upper Endoscopy (EGD)   EGD Upper Endoscopy       DONE     [facility]     [place], [place]  [id number]           ENDOSCOPY PROCEDURE REPORT           PATIENT:  [name], [name]  MR#:  [id number]     BIRTHDATE:  07-Jan-1956, 53 yrs. old  GENDER:  male           ENDOSCOPIST:  Docia Chuck. Geri Seminole, MD     Referred by:  [facility],           PROCEDURE DATE:  09/23/2009     PROCEDURE:  EGD, diagnostic     ASA CLASS:  Class III     INDICATIONS:  FOBT + stool, anemia  and dark stool in face of     INR>15!           MEDICATIONS:   Fentanyl 50 mcg, Versed 4 mg     TOPICAL ANESTHETIC:  Cetacaine Spray           DESCRIPTION OF PROCEDURE:   After the risks benefits and     alternatives of the procedure were thoroughly explained, informed     consent was obtained.  The Pentax Gastroscope [id number] endoscope     was introduced through the mouth and advanced to the second     portion of the duodenum, without limitations.  The instrument was     slowly withdrawn as the mucosa was fully examined.     <<PROCEDUREIMAGES>>           The upper, middle, and distal third of the esophagus were     carefully inspected and no abnormalities were noted. The z-line     was well seen at the GEJ. The endoscope was pushed into the fundus     which was normal including a retroflexed view. The antrum,gastric     body, first and second part of the duodenum were unremarkable.     Retroflexed views revealed no abnormalities.    The scope was then     withdrawn from the patient and the procedure completed.           COMPLICATIONS:  None           ENDOSCOPIC IMPRESSION:     1) Normal EGD           RECOMMENDATIONS:     1)KEEP INR IN THERAPEUTIC RANGE     2) CONSIDER CAPSULE ENDOSCOPY IF CLINICALLY RELEVANT GI BLEEDING     OCCURS DESPITE THERAPEUTIC INR       ______________________________     Docia Chuck. Geri Seminole, MD           CC:  Owens Loffler, MD, [employer], MD, Eligah East, MD           n.     eSIGNED:   Docia Chuck. Geri Seminole at 09/23/2009 11:57 AM           Golden Hurter, [id number]  Note: An exclamation mark (!) indicates a result that was not dispersed into the flowsheet. Document Creation Date: 09/23/2009 11:57 AM _______________________________________________________________________  (1) Order result status: Final Collection or observation date-time: 09/23/2009 11:48 Requested date-time:  Receipt date-time:  Reported date-time:  Referring Physician:  Ordering Physician: Lavena Bullion [phone]) Specimen Source:  Source: Tawanna Cooler Order Number: [id number] Lab site:

## 2010-03-29 NOTE — Assessment & Plan Note (Signed)
Summary: ANEMIA/RS R/S FROM MONDAY   History of Present Illness Visit Type: Follow-up Visit Primary GI MD: Owens Loffler MD Primary Provider: Willy Eddy Requesting Provider: Eligah East, MD Chief Complaint: anemia History of Present Illness:   PLEASANT 55 Y.O MALE KNOWN TO DR. Ardis Hughs  FROM COLONOSCOPY DONE IN 03/2008 WHICH SHOWED DIVERTICULOSIS ONLY. HE IS REFERRED AT THIS TIME FOR EVALUATION OF ANEMIA WITH DRIFTING HGB. HGB WAS 11.4  ON 6/20,PT 30/INR2.91. ON 7/20 HIS HGB WAS 8.5.  HE IS ON COUMADIN FOR HX OF DVT'S.   PT REPORTS DARK STOOLS OVER THE PAST FEW WEEKS,HAVING 1-2 BM'S/DAY. HE DENIES ABDOMINAL PAIN,NO NAUSEA/VOMITING,HEARTBURN,INDIGESTION ETC. HIS APPETITE HAS BEEN POOR. HE DENIES SOB OR CP. NO NSAID USE.    GI Review of Systems    Reports loss of appetite.      Denies abdominal pain, acid reflux, belching, bloating, chest pain, dysphagia with liquids, dysphagia with solids, heartburn, nausea, vomiting, vomiting blood, and  weight loss.      Reports black tarry stools and  diverticulosis.     Denies anal fissure, change in bowel habit, constipation, diarrhea, fecal incontinence, heme positive stool, hemorrhoids, irritable bowel syndrome, jaundice, light color stool, liver problems, rectal bleeding, and  rectal pain.    Current Medications (verified): 1)  Lotensin 10 Mg Tabs (Benazepril Hcl) .Marland Kitchen.. 1 By Mouth Once Daily 2)  Simvastatin 10 Mg Tabs (Simvastatin) .Marland Kitchen.. 1 By Mouth Once Daily 3)  Warfarin Sodium 5 Mg  Tabs (Warfarin Sodium) .... As Directed 4)  Ferrous Sulfate 325 (65 Fe) Mg  Tbec (Ferrous Sulfate) .... One Tablet By Mouth Three Times A Day 5)  Levothyroxine Sodium 150 Mcg  Tabs (Levothyroxine Sodium) .... One Tablet By Mouth Once A Day 6)  Glyburide-Metformin 2.5-500 Mg  Tabs (Glyburide-Metformin) .... One Tablet By Mouth Twice A Day 7)  Lantus 100 Unit/ml  Soln (Insulin Glargine) .... 30 U Per Day 8)  Novolog 100 Unit/ml  Soln (Insulin Aspart) .... Sliding  Scale 9)  Verapamil Hcl Cr 180 Mg Xr24h-Cap (Verapamil Hcl) .Marland Kitchen.. 1 By Mouth Once Daily 10)  Flomax 0.4 Mg Caps (Tamsulosin Hcl) .Marland Kitchen.. 1 By Mouth Once Daily 11)  Metformin Hcl 1000 Mg Tabs (Metformin Hcl) .Marland Kitchen.. 1 By Mouth At Bedtime 12)  Niaspan 500 Mg Cr-Tabs (Niacin (Antihyperlipidemic)) .... Take 1 By Mouth At Bedtime 13)  Aspirin 81 Mg Tbec (Aspirin) .Marland Kitchen.. 1 By Mouth Once Daily 14)  Furosemide 40 Mg Tabs (Furosemide) .Marland Kitchen.. 1 By Mouth Once Daily 15)  Nexium 40 Mg Cpdr (Esomeprazole Magnesium) .... Take 1 Cap 30 Min Prior To Breakfast 16)  Tradjenta 5 Mg .Marland Kitchen.. 1 By Mouth Once Daily  Allergies (verified): No Known Drug Allergies  Past History:  Past Medical History: arthritis, diabetes requiring insulin, history of DVT in 2004 and also since then, he is been on Coumadin for least 5 years although held his Coumadin for a recent lymph node biopsy in his groin. Cellulitis, hypercholesterolemia, hypertension, hypothyroidism, history of kidney stones, morbid obesity, obstructive sleep apnea.  Lymphadema DIVERTICULOSIS  Past Surgical History: Reviewed history from 10/01/2007 and no changes required. 2 hip replacements in 2004. Hoping for another knee replacement in November 2009  Family History: Reviewed history from 10/01/2007 and no changes required. Family History of Colon Cancer: father had colon cancer (in his late 62's), needed surgery but no chemo/xrt. Family History of Diabetes:  Family History of Heart Disease:   Social History: Reviewed history from 10/01/2007 and no changes required. married, no children, never  smoked cigarettes, does not drink alcohol Occupation: Not working  Review of Systems       The patient complains of arthritis/joint pain, back pain, cough, fatigue, sleeping problems, swelling of feet/legs, and urination changes/pain.  The patient denies allergy/sinus, anemia, blood in urine, breast changes/lumps, change in vision, confusion, coughing up blood,  depression-new, fainting, fever, headaches-new, hearing problems, heart murmur, heart rhythm changes, itching, muscle pains/cramps, night sweats, nosebleeds, shortness of breath, skin rash, sore throat, swollen lymph glands, thirst - excessive, urination - excessive, urine leakage, vision changes, and voice change.         ROS OTHERWISE AS IN HPI  Vital Signs:  Patient profile:   55 year old male Height:      72 inches Weight:      343.50 pounds BMI:     46.76 Pulse rate:   88 / minute Pulse rhythm:   regular BP sitting:   138 / 68  (left arm)  Vitals Entered By: Randye Lobo NCMA (September 21, 2009 2:06 PM)  Physical Exam  General:  Well developed, well nourished, no acute distress.,PALE Head:  Normocephalic and atraumatic. Eyes:  PERRLA, no icterus. Neck:  Supple; no masses or thyromegaly. Lungs:  Clear throughout to auscultation.,DECREASED BASES Heart:  Regular rate and rhythm; no murmurs, rubs,  or bruits. Abdomen:  OBESE,SOFT, NONTENDER, NO MASS OR PALP HSM, BS+ Rectal:  DARK STOOL,NO OVERT BLOOD,STRONGLY HEME + Extremities:  BOTH LOWER EXT WRAPPED,LLE LARGER THAN RIGHT Neurologic:  Alert and  oriented x4;  grossly normal neurologically. Psych:  Alert and cooperative. Normal mood and affect.,TALKATIVE   Impression & Recommendations:  Problem # 1:  ANEMIA-UNSPECIFIED (R2654735.9) Assessment New 55 YO MALE WITH ANEMIA,DROP IN HGB ,DARK HEME POSITIVE STOOL IN SETTING OF CHRONIC COUMADIN AND ASA USE. R/O PUD,EROSIVE GASTRITIS. R/O SUPRA THERAPEUTIC INR AS PT MENTIONS OOZING FROM HIS INSULIN INJECTION SITES...  CBC,PT NOW START NEXIUM 40 MG DAILY SCHEDULE FOR UPPER ENDOSCOPY WITH DR. Orie Rout ON FRIDAY 7/29. PT MAY NEED TRANSFUSED IF HGB BELOW 8.   ** ADDENDUM  - LABS TODAY-HGB 7.7  PT 164  !!! /INR15.3 WILL ARRANGE FOR ADMISSION TONIGHT TO [facility] ON HOSPITALIST SERVICE  FOR TRANSFUSIONS,CORRECTION OF INR,EVENTUAL EGD . I SPOKE WITH PT -HE UNDERSTANDS.  Problem # 2:  OBESITY  (ICD-278.00) Assessment: Comment Only  Problem # 3:  COUMADIN THERAPY (ICD-V58.61) Assessment: Comment Only MARKEDLY ABNORMAL INR  Problem # 4:  HYPERLIPIDEMIA (ICD-272.4) Assessment: Comment Only  Problem # 5:  HYPERTENSION (ICD-401.9) Assessment: Comment Only  Problem # 6:  DIVERTICULOSIS OF COLON (ICD-562.10) Assessment: Comment Only  Orders: TLB-Ferritin (82728-FER) TLB-Iron, (Fe) Total (83540-FE) TLB-IBC Pnl (Iron/FE;Transferrin) (83550-IBC) TLB-PT (Protime) (85610-PTP) TLB-CBC Platelet - w/Differential (85025-CBCD) TLB-BUN (Urea Nitrogen) (84520-BUN)  Problem # 7:  DVT (ICD-453.40) Assessment: Comment Only  Problem # 8:  DIABETES MELLITUS-TYPE II (ICD-250.00) Assessment: Comment Only INSULIN DEPENDENT  Problem # 9:  LYMPHEDEMA  BILATERAL LE. Assessment: Comment Only  Other Orders: EGD (EGD)

## 2010-03-29 NOTE — Letter (Signed)
Summary: EGD Instructions  [facility]  [place], [place] [address]   Phone: [phone]  Fax: [phone]       DERRYL CHAVANA    [birth year]    MRN: [id number]       Procedure Day /Date:09-24-09     Arrival Time: 7:30 AM      Procedure Time: 8:00 AM     Location of Procedure:                    X     [place] (4th Floor)  PREPARATION FOR ENDOSCOPY   On 09-24-09 THE DAY OF THE PROCEDURE:  1.   No solid foods, milk or milk products are allowed after midnight the night before your procedure.  2.   Do not drink anything colored red or purple.  Avoid juices with pulp.  No orange juice.  3.  You may drink clear liquids until 6:00 AM , which is 2 hours before your procedure.                                                                                                CLEAR LIQUIDS INCLUDE: Water Jello Ice Popsicles Tea (sugar ok, no milk/cream) Powdered fruit flavored drinks Coffee (sugar ok, no milk/cream) Gatorade Juice: apple, white grape, white cranberry  Lemonade Clear bullion, consomm, broth Carbonated beverages (any kind) Strained chicken noodle soup Hard Candy   MEDICATION INSTRUCTIONS  Unless otherwise instructed, you should take regular prescription medications with a small sip of water as early as possible the morning of your procedure.  Diabetic patients - see separate instructions.        OTHER INSTRUCTIONS  You will need a responsible adult at least 53 years of age to accompany you and drive you home.   This person must remain in the waiting room during your procedure.  Wear loose fitting clothing that is easily removed.  Leave jewelry and other valuables at home.  However, you may wish to bring a book to read or an iPod/MP3 player to listen to music as you wait for your procedure to start.  Remove all body piercing jewelry and leave at home.  Total time from sign-in until discharge is approximately 2-3  hours.  You should go home directly after your procedure and rest.  You can resume normal activities the day after your procedure.  The day of your procedure you should not:   Drive   Make legal decisions   Operate machinery   Drink alcohol   Return to work  You will receive specific instructions about eating, activities and medications before you leave.    The above instructions have been reviewed and explained to me by   _______________________    I fully understand and can verbalize these instructions _____________________________ Date _________

## 2010-03-29 NOTE — Letter (Signed)
Summary: Diabetic Instructions  [facility]  [place], [place] [address]   Phone: [phone]  Fax: [phone]    [name] [birth year] MRN: [id number]   X   ORAL DIABETIC MEDICATION INSTRUCTIONS  The day before your procedure:   Take your diabetic pill as you do normally  The day of your procedure:   Do not take your diabetic pill    We will check your blood sugar levels during the admission process and again in Recovery before discharging you home  ________________________________________________________________________  X   INSULIN (LONG ACTING) MEDICATION INSTRUCTIONS (Lantus, NPH, 70/30, Humulin, Novolin-N)   The day before your procedure:   Take  your regular evening dose    The day of your procedure:   Do not take your morning dose    X   INSULIN (SHORT ACTING) MEDICATION INSTRUCTIONS (Regular, Humulog, Novolog)   The day before your procedure:   Do not take your evening dose   The day of your procedure:   Do not take your morning dose   _  _   INSULIN PUMP MEDICATION INSTRUCTIONS  We will contact the physician managing your diabetic care for written dosage instructions for the day before your procedure and the day of your procedure.  Once we have received the instructions, we will contact you.

## 2010-03-29 NOTE — Progress Notes (Signed)
Summary: Triage  Phone Note From Other Clinic   Caller: [facility] @ [facility] [phone] Call For: Dr. Ardis Hughs Summary of Call: reguesting pt. be seen sooner than next avail. pt. is anemic his hemoglobin is 8.5.Marland KitchenMarland KitchenMarland Kitchenfaxing records Initial call taken by: Webb Laws,  September 16, 2009 9:27 AM  Follow-up for Phone Call        Spoke with Lenna Sciara pt has Hgb of 8.5 and Dr Seth Bake would like pt seen sooner than Sept first available.  Appt given for Amy on 09/20/09. Follow-up by: Christian Mate CMA Deborra Medina),  September 16, 2009 9:54 AM     Appended Document: Triage PT NO SHOWED APPOINTMENT WITH AMY,R/S PT FOR TUESDAY AT 1:30PM

## 2010-03-31 NOTE — Miscellaneous (Signed)
Summary: [facility]:  Admission Note  [facility]:  Admission Note   Imported By: Bonner Puna 03/08/2010 10:44:58  _____________________________________________________________________  External Attachment:    Type:   Image     Comment:   External Document

## 2010-03-31 NOTE — Op Note (Signed)
NAMESHAQUILLA, REESE NO.:  [id number]  MEDICAL RECORD NO.:  [id number]          PATIENT TYPE:  INP  LOCATION:  5004                         FACILITY:  [facility]  PHYSICIAN:  Newt Minion, MD     DATE OF BIRTH:  04/02/1955  DATE OF PROCEDURE:  03/04/2010 DATE OF DISCHARGE:                              OPERATIVE REPORT   PREOPERATIVE DIAGNOSIS:  Abscess, left knee.  POSTOPERATIVE DIAGNOSIS:  Abscess, left knee.  PROCEDURE:  Irrigation and debridement of the abscess and placement of absorbable antibiotic beads with vancomycin of 500 mg and gentamicin 240 mg.  SURGEON:  Newt Minion, MD  ANESTHESIA:  General.  ESTIMATED BLOOD LOSS:  Minimal.  ANTIBIOTICS:  Kefzol 1 g preoperatively.  DRAINS:  None.  COMPLICATIONS:  None.  Cultures obtained x2.  DISPOSITION:  PACU in stable condition.  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  He is status post infection for the left total hip, and has undergone revision and has had recovered from this as well.  The patient developed a extra-articular abscess over the lateral aspect of the left knee.  CT scan confirms no fluid within the joint, previous attempted aspirations of the joint revealed no fluid, and the patient presents at this time for debridement, irrigation, placement of antibiotic beads, and an abscess superficial to the joint.  Discussed that if this did extend to the joint the patient would require removal of total knee replacement. Risks and benefits were discussed including persistent infection, neurovascular injury, need for additional surgery as well as risk for DVT, the patient states he understands and wished to proceed at this time.  DESCRIPTION OF PROCEDURE:  The patient was brought to OR, room 4 and underwent general anesthetic.  After adequate level of anesthesia obtained, the patient's left lower extremity  was prepped using DuraPrep and draped into a sterile field.  His previous midline incision was made.  This was carried down extra-articular over the lateral aspect. The previous retinacular incision was medially, so this was not in a side of the retinacular incision.  After dissection laterally, a large purulent abscess was encountered.  This was irrigated and debrided with 6 liters of pulsatile lavage, necrotic tissue was removed.  Attempted entry into the joint was made, but there was no communication between the joint and the abscess.  Antibiotic beads were made with stimulant cement, 500 mg of vancomycin, 240 mg of gentamicin.  The beads were placed in the abscess area.  A Hemovac drain was placed as well.  The incision was closed using a 2-0 nylon with a far-near-near-far suture technique as well as modified vertical mattress suture technique.  The incision closed well.  The wound was covered with Adaptic, orthopedic sponges, ABD dressing, Kerlix, and Coban.  The patient was extubated and taken to PACU in stable condition.  Plan for admission, IV vancomycin, Zosyn, and changing antibiotics, pending results of the final cultures.     Newt Minion, MD     MVD/MEDQ  D:  03/04/2010  T:  03/05/2010  Job:  [id number]  Electronically Signed by Meridee Score MD on 03/31/2010 07:27:52 AM

## 2010-03-31 NOTE — Discharge Summary (Signed)
  [name], KEULER NO.:  [id number]  MEDICAL RECORD NO.:  [id number]          PATIENT TYPE:  INP  LOCATION:  5004                         FACILITY:  [facility]  PHYSICIAN:  Newt Minion, MD     DATE OF BIRTH:  1955-07-10  DATE OF ADMISSION:  03/04/2010 DATE OF DISCHARGE:  03/08/2010                              DISCHARGE SUMMARY   FINAL DIAGNOSIS:  Abscess, extra-articular left knee.  PROCEDURE:  Irrigation and debridement of the abscess and placement of absorbable antibiotic beads with vancomycin 500 mg and gentamicin 240 mg.  Discharged to home in stable condition.  Continue home medications as per the [facility] and his admission medication schedule.  New medication prescriptions provided for Vicodin for pain and Cipro 750 mg p.o. b.i.d. for 1 month.  Cultures negative at the time of discharge.  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 Additional diagnosis include obesity with BMI 48.4.  Discharged to home in stable condition.  Compressive wrap for the left lower extremity. Follow up with Dr. Sharol Given in 2 weeks.     Newt Minion, MD     MVD/MEDQ  D:  03/08/2010  T:  03/08/2010  Job:  [id number]  Electronically Signed by Meridee Score MD on 03/31/2010 07:27:46 AM

## 2010-04-25 ENCOUNTER — Encounter (HOSPITAL_COMMUNITY)
Admission: RE | Admit: 2010-04-25 | Discharge: 2010-04-25 | Disposition: A | Discharge status: Home / Self Care | Payer: Medicare Other | Source: Ambulatory Visit | Attending: Orthopedic Surgery | Admitting: Orthopedic Surgery

## 2010-04-25 DIAGNOSIS — Z01812 Encounter for preprocedural laboratory examination: Secondary | ICD-10-CM | POA: Insufficient documentation

## 2010-04-25 LAB — SURGICAL PCR SCREEN: MRSA, PCR: POSITIVE — AB

## 2010-04-25 LAB — COMPREHENSIVE METABOLIC PANEL
AST: 17 U/L (ref 0–37)
Albumin: 3.1 g/dL — ABNORMAL LOW (ref 3.5–5.2)
Alkaline Phosphatase: 75 U/L (ref 39–117)
BUN: 20 mg/dL (ref 6–23)
GFR calc Af Amer: >60 mL/min (ref >60)
Potassium: 4.2 mEq/L (ref 3.5–5.1)
Sodium: 139 mEq/L (ref 135–145)
Total Protein: 6.1 g/dL (ref 6.0–8.3)

## 2010-04-25 LAB — PROTIME-INR: INR: 2.1 — ABNORMAL HIGH (ref 0.00–1.49)

## 2010-04-25 LAB — APTT: aPTT: 43 seconds — ABNORMAL HIGH (ref 24–37)

## 2010-04-25 LAB — CBC
HCT: 30.8 % — ABNORMAL LOW (ref 39.0–52.0)
Hemoglobin: 9.8 g/dL — ABNORMAL LOW (ref 13.0–17.0)
RBC: 3.65 MIL/uL — ABNORMAL LOW (ref 4.22–5.81)
RDW: 18.3 % — ABNORMAL HIGH (ref 11.5–15.5)
WBC: 4.1 10*3/uL (ref 4.0–10.5)

## 2010-04-26 ENCOUNTER — Inpatient Hospital Stay (HOSPITAL_COMMUNITY): Payer: Medicare Other

## 2010-04-26 ENCOUNTER — Inpatient Hospital Stay (HOSPITAL_COMMUNITY)
Admission: RE | Admit: 2010-04-26 | Discharge: 2010-04-29 | DRG: 467 | Disposition: A | Discharge status: Skilled Nursing Facility | Payer: Medicare Other | Source: Ambulatory Visit | Attending: Orthopedic Surgery | Admitting: Orthopedic Surgery

## 2010-04-26 DIAGNOSIS — Z86718 Personal history of other venous thrombosis and embolism: Secondary | ICD-10-CM

## 2010-04-26 DIAGNOSIS — Z96659 Presence of unspecified artificial knee joint: Secondary | ICD-10-CM

## 2010-04-26 DIAGNOSIS — M009 Pyogenic arthritis, unspecified: Secondary | ICD-10-CM | POA: Diagnosis present

## 2010-04-26 DIAGNOSIS — I10 Essential (primary) hypertension: Secondary | ICD-10-CM | POA: Diagnosis present

## 2010-04-26 DIAGNOSIS — E119 Type 2 diabetes mellitus without complications: Secondary | ICD-10-CM | POA: Diagnosis present

## 2010-04-26 DIAGNOSIS — Y831 Surgical operation with implant of artificial internal device as the cause of abnormal reaction of the patient, or of later complication, without mention of misadventure at the time of the procedure: Secondary | ICD-10-CM | POA: Diagnosis present

## 2010-04-26 DIAGNOSIS — Z22322 Carrier or suspected carrier of Methicillin resistant Staphylococcus aureus: Secondary | ICD-10-CM

## 2010-04-26 DIAGNOSIS — G609 Hereditary and idiopathic neuropathy, unspecified: Secondary | ICD-10-CM | POA: Diagnosis present

## 2010-04-26 DIAGNOSIS — E039 Hypothyroidism, unspecified: Secondary | ICD-10-CM | POA: Diagnosis present

## 2010-04-26 DIAGNOSIS — T8450XA Infection and inflammatory reaction due to unspecified internal joint prosthesis, initial encounter: Principal | ICD-10-CM | POA: Diagnosis present

## 2010-04-26 DIAGNOSIS — Z794 Long term (current) use of insulin: Secondary | ICD-10-CM

## 2010-04-26 DIAGNOSIS — Z86711 Personal history of pulmonary embolism: Secondary | ICD-10-CM

## 2010-04-26 DIAGNOSIS — Z7901 Long term (current) use of anticoagulants: Secondary | ICD-10-CM

## 2010-04-26 DIAGNOSIS — Z79899 Other long term (current) drug therapy: Secondary | ICD-10-CM

## 2010-04-26 DIAGNOSIS — Z6841 Body Mass Index (BMI) 40.0 and over, adult: Secondary | ICD-10-CM

## 2010-04-26 LAB — GLUCOSE, CAPILLARY
Glucose-Capillary: 200 mg/dL — ABNORMAL HIGH (ref 70–99)
Glucose-Capillary: 302 mg/dL — ABNORMAL HIGH (ref 70–99)

## 2010-04-26 LAB — GRAM STAIN

## 2010-04-27 DIAGNOSIS — A0472 Enterocolitis due to Clostridium difficile, not specified as recurrent: Secondary | ICD-10-CM

## 2010-04-27 LAB — GLUCOSE, CAPILLARY: Glucose-Capillary: 160 mg/dL — ABNORMAL HIGH (ref 70–99)

## 2010-04-27 LAB — CBC
HCT: 24.7 % — ABNORMAL LOW (ref 39.0–52.0)
MCHC: 31.2 g/dL (ref 30.0–36.0)
Platelets: 167 10*3/uL (ref 150–400)
RDW: 19 % — ABNORMAL HIGH (ref 11.5–15.5)
WBC: 4.9 10*3/uL (ref 4.0–10.5)

## 2010-04-27 LAB — BASIC METABOLIC PANEL
BUN: 26 mg/dL — ABNORMAL HIGH (ref 6–23)
Calcium: 9 mg/dL (ref 8.4–10.5)
GFR calc non Af Amer: 52 mL/min — ABNORMAL LOW (ref >60)
Glucose, Bld: 160 mg/dL — ABNORMAL HIGH (ref 70–99)
Sodium: 136 mEq/L (ref 135–145)

## 2010-04-27 LAB — PROTIME-INR: Prothrombin Time: 28.5 seconds — ABNORMAL HIGH (ref 11.6–15.2)

## 2010-04-28 ENCOUNTER — Inpatient Hospital Stay (HOSPITAL_COMMUNITY): Payer: Medicare Other

## 2010-04-28 LAB — GLUCOSE, CAPILLARY
Glucose-Capillary: 164 mg/dL — ABNORMAL HIGH (ref 70–99)
Glucose-Capillary: 195 mg/dL — ABNORMAL HIGH (ref 70–99)

## 2010-04-28 LAB — CROSSMATCH
ABO/RH(D): O POS
Antibody Screen: NEGATIVE
Unit division: 0

## 2010-04-28 LAB — BASIC METABOLIC PANEL
CO2: 26 mEq/L (ref 19–32)
Calcium: 9 mg/dL (ref 8.4–10.5)
GFR calc Af Amer: >60 mL/min (ref >60)
GFR calc non Af Amer: >60 mL/min (ref >60)
Sodium: 138 mEq/L (ref 135–145)

## 2010-04-28 LAB — CBC
MCV: 84.1 fL (ref 78.0–100.0)
Platelets: 132 10*3/uL — ABNORMAL LOW (ref 150–400)
RBC: 2.95 MIL/uL — ABNORMAL LOW (ref 4.22–5.81)
WBC: 4.6 10*3/uL (ref 4.0–10.5)

## 2010-04-29 LAB — GLUCOSE, CAPILLARY
Glucose-Capillary: 105 mg/dL — ABNORMAL HIGH (ref 70–99)
Glucose-Capillary: 172 mg/dL — ABNORMAL HIGH (ref 70–99)

## 2010-04-29 LAB — BASIC METABOLIC PANEL
Chloride: 100 mEq/L (ref 96–112)
Creatinine, Ser: 1.11 mg/dL (ref 0.4–1.5)
GFR calc Af Amer: >60 mL/min (ref >60)
Potassium: 3.9 mEq/L (ref 3.5–5.1)
Sodium: 136 mEq/L (ref 135–145)

## 2010-04-29 LAB — CBC
HCT: 24.1 % — ABNORMAL LOW (ref 39.0–52.0)
Hemoglobin: 7.7 g/dL — ABNORMAL LOW (ref 13.0–17.0)
MCV: 83.7 fL (ref 78.0–100.0)
Platelets: 137 10*3/uL — ABNORMAL LOW (ref 150–400)

## 2010-04-29 LAB — TISSUE CULTURE

## 2010-05-01 LAB — ANAEROBIC CULTURE

## 2010-05-10 ENCOUNTER — Other Ambulatory Visit (HOSPITAL_COMMUNITY): Payer: Self-pay | Admitting: Family Medicine

## 2010-05-10 ENCOUNTER — Ambulatory Visit (HOSPITAL_COMMUNITY)
Admission: RE | Admit: 2010-05-10 | Discharge: 2010-05-10 | Disposition: A | Discharge status: Home / Self Care | Payer: Medicare Other | Source: Ambulatory Visit | Attending: Family Medicine | Admitting: Family Medicine

## 2010-05-10 DIAGNOSIS — L0390 Cellulitis, unspecified: Secondary | ICD-10-CM

## 2010-05-10 DIAGNOSIS — B999 Unspecified infectious disease: Secondary | ICD-10-CM | POA: Insufficient documentation

## 2010-05-11 LAB — GLUCOSE, CAPILLARY
Glucose-Capillary: 136 mg/dL — ABNORMAL HIGH (ref 70–99)
Glucose-Capillary: 141 mg/dL — ABNORMAL HIGH (ref 70–99)
Glucose-Capillary: 148 mg/dL — ABNORMAL HIGH (ref 70–99)
Glucose-Capillary: 215 mg/dL — ABNORMAL HIGH (ref 70–99)
Glucose-Capillary: 223 mg/dL — ABNORMAL HIGH (ref 70–99)
Glucose-Capillary: 139 mg/dL — ABNORMAL HIGH (ref 70–99)
Glucose-Capillary: 154 mg/dL — ABNORMAL HIGH (ref 70–99)
Glucose-Capillary: 158 mg/dL — ABNORMAL HIGH (ref 70–99)
Glucose-Capillary: 158 mg/dL — ABNORMAL HIGH (ref 70–99)
Glucose-Capillary: 162 mg/dL — ABNORMAL HIGH (ref 70–99)
Glucose-Capillary: 163 mg/dL — ABNORMAL HIGH (ref 70–99)
Glucose-Capillary: 166 mg/dL — ABNORMAL HIGH (ref 70–99)
Glucose-Capillary: 167 mg/dL — ABNORMAL HIGH (ref 70–99)
Glucose-Capillary: 167 mg/dL — ABNORMAL HIGH (ref 70–99)
Glucose-Capillary: 168 mg/dL — ABNORMAL HIGH (ref 70–99)
Glucose-Capillary: 177 mg/dL — ABNORMAL HIGH (ref 70–99)
Glucose-Capillary: 179 mg/dL — ABNORMAL HIGH (ref 70–99)
Glucose-Capillary: 180 mg/dL — ABNORMAL HIGH (ref 70–99)
Glucose-Capillary: 182 mg/dL — ABNORMAL HIGH (ref 70–99)
Glucose-Capillary: 183 mg/dL — ABNORMAL HIGH (ref 70–99)
Glucose-Capillary: 185 mg/dL — ABNORMAL HIGH (ref 70–99)
Glucose-Capillary: 186 mg/dL — ABNORMAL HIGH (ref 70–99)
Glucose-Capillary: 203 mg/dL — ABNORMAL HIGH (ref 70–99)
Glucose-Capillary: 216 mg/dL — ABNORMAL HIGH (ref 70–99)
Glucose-Capillary: 240 mg/dL — ABNORMAL HIGH (ref 70–99)
Glucose-Capillary: 256 mg/dL — ABNORMAL HIGH (ref 70–99)
Glucose-Capillary: 273 mg/dL — ABNORMAL HIGH (ref 70–99)
Glucose-Capillary: 283 mg/dL — ABNORMAL HIGH (ref 70–99)
Glucose-Capillary: 395 mg/dL — ABNORMAL HIGH (ref 70–99)

## 2010-05-11 LAB — BASIC METABOLIC PANEL
BUN: 20 mg/dL (ref 6–23)
BUN: 16 mg/dL (ref 6–23)
BUN: 16 mg/dL (ref 6–23)
BUN: 19 mg/dL (ref 6–23)
CO2: 27 mEq/L (ref 19–32)
CO2: 29 mEq/L (ref 19–32)
CO2: 30 mEq/L (ref 19–32)
Calcium: 9.1 mg/dL (ref 8.4–10.5)
Calcium: 9.4 mg/dL (ref 8.4–10.5)
Calcium: 9.7 mg/dL (ref 8.4–10.5)
Chloride: 100 mEq/L (ref 96–112)
Chloride: 101 mEq/L (ref 96–112)
Creatinine, Ser: 1.25 mg/dL (ref 0.4–1.5)
Creatinine, Ser: 1.47 mg/dL (ref 0.4–1.5)
GFR calc Af Amer: >60 mL/min (ref >60)
GFR calc Af Amer: >60 mL/min (ref >60)
GFR calc non Af Amer: 58 mL/min — ABNORMAL LOW (ref >60)
GFR calc non Af Amer: 58 mL/min — ABNORMAL LOW (ref >60)
Glucose, Bld: 176 mg/dL — ABNORMAL HIGH (ref 70–99)
Glucose, Bld: 120 mg/dL — ABNORMAL HIGH (ref 70–99)
Glucose, Bld: 127 mg/dL — ABNORMAL HIGH (ref 70–99)
Glucose, Bld: 186 mg/dL — ABNORMAL HIGH (ref 70–99)
Glucose, Bld: 219 mg/dL — ABNORMAL HIGH (ref 70–99)
Potassium: 4.1 mEq/L (ref 3.5–5.1)
Potassium: 4.3 mEq/L (ref 3.5–5.1)
Potassium: 4.9 mEq/L (ref 3.5–5.1)
Sodium: 133 mEq/L — ABNORMAL LOW (ref 135–145)
Sodium: 135 mEq/L (ref 135–145)
Sodium: 137 mEq/L (ref 135–145)

## 2010-05-11 LAB — WOUND CULTURE

## 2010-05-11 LAB — HEMOGLOBIN AND HEMATOCRIT, BLOOD
HCT: 27.6 % — ABNORMAL LOW (ref 39.0–52.0)
Hemoglobin: 8.7 g/dL — ABNORMAL LOW (ref 13.0–17.0)

## 2010-05-11 LAB — CBC
HCT: 21.2 % — ABNORMAL LOW (ref 39.0–52.0)
HCT: 23.9 % — ABNORMAL LOW (ref 39.0–52.0)
HCT: 24.9 % — ABNORMAL LOW (ref 39.0–52.0)
HCT: 25.5 % — ABNORMAL LOW (ref 39.0–52.0)
HCT: 30.4 % — ABNORMAL LOW (ref 39.0–52.0)
HCT: 31.7 % — ABNORMAL LOW (ref 39.0–52.0)
Hemoglobin: 8.1 g/dL — ABNORMAL LOW (ref 13.0–17.0)
Hemoglobin: 7.6 g/dL — ABNORMAL LOW (ref 13.0–17.0)
Hemoglobin: 7.9 g/dL — ABNORMAL LOW (ref 13.0–17.0)
Hemoglobin: 8.2 g/dL — ABNORMAL LOW (ref 13.0–17.0)
Hemoglobin: 9.4 g/dL — ABNORMAL LOW (ref 13.0–17.0)
Hemoglobin: 9.7 g/dL — ABNORMAL LOW (ref 13.0–17.0)
MCH: 25.4 pg — ABNORMAL LOW (ref 26.0–34.0)
MCH: 25.8 pg — ABNORMAL LOW (ref 26.0–34.0)
MCH: 26.7 pg (ref 26.0–34.0)
MCH: 26.8 pg (ref 26.0–34.0)
MCH: 27.2 pg (ref 26.0–34.0)
MCHC: 32.3 g/dL (ref 30.0–36.0)
MCHC: 30.6 g/dL (ref 30.0–36.0)
MCHC: 30.7 g/dL (ref 30.0–36.0)
MCHC: 30.9 g/dL (ref 30.0–36.0)
MCHC: 31.7 g/dL (ref 30.0–36.0)
MCHC: 31.8 g/dL (ref 30.0–36.0)
MCHC: 32.2 g/dL (ref 30.0–36.0)
MCV: 82.5 fL (ref 78.0–100.0)
MCV: 83 fL (ref 78.0–100.0)
MCV: 83.3 fL (ref 78.0–100.0)
MCV: 84.1 fL (ref 78.0–100.0)
MCV: 84.7 fL (ref 78.0–100.0)
Platelets: 154 10*3/uL (ref 150–400)
Platelets: 198 10*3/uL (ref 150–400)
Platelets: 207 10*3/uL (ref 150–400)
RBC: 2.96 MIL/uL — ABNORMAL LOW (ref 4.22–5.81)
RBC: 3.01 MIL/uL — ABNORMAL LOW (ref 4.22–5.81)
RBC: 3.77 MIL/uL — ABNORMAL LOW (ref 4.22–5.81)
RDW: 16.5 % — ABNORMAL HIGH (ref 11.5–15.5)
RDW: 16.8 % — ABNORMAL HIGH (ref 11.5–15.5)
RDW: 16.9 % — ABNORMAL HIGH (ref 11.5–15.5)
RDW: 17 % — ABNORMAL HIGH (ref 11.5–15.5)
RDW: 17.2 % — ABNORMAL HIGH (ref 11.5–15.5)
RDW: 17.3 % — ABNORMAL HIGH (ref 11.5–15.5)
WBC: 3.5 10*3/uL — ABNORMAL LOW (ref 4.0–10.5)
WBC: 5.2 10*3/uL (ref 4.0–10.5)
WBC: 6.4 10*3/uL (ref 4.0–10.5)

## 2010-05-11 LAB — COMPREHENSIVE METABOLIC PANEL
ALT: 18 U/L (ref 0–53)
BUN: 20 mg/dL (ref 6–23)
CO2: 26 mEq/L (ref 19–32)
Calcium: 9.8 mg/dL (ref 8.4–10.5)
GFR calc non Af Amer: 59 mL/min — ABNORMAL LOW (ref >60)
Glucose, Bld: 150 mg/dL — ABNORMAL HIGH (ref 70–99)
Sodium: 137 mEq/L (ref 135–145)

## 2010-05-11 LAB — HEMOGLOBIN A1C: Hgb A1c MFr Bld: 6.4 % — ABNORMAL HIGH (ref <5.7)

## 2010-05-11 LAB — CROSSMATCH
Antibody Screen: NEGATIVE
Antibody Screen: NEGATIVE
Unit division: 0

## 2010-05-11 LAB — PROTIME-INR
INR: 1.62 — ABNORMAL HIGH (ref 0.00–1.49)
INR: 1.79 — ABNORMAL HIGH (ref 0.00–1.49)
INR: 1.98 — ABNORMAL HIGH (ref 0.00–1.49)
INR: 2.06 — ABNORMAL HIGH (ref 0.00–1.49)
Prothrombin Time: 18.3 seconds — ABNORMAL HIGH (ref 11.6–15.2)
Prothrombin Time: 18.6 seconds — ABNORMAL HIGH (ref 11.6–15.2)
Prothrombin Time: 19.4 seconds — ABNORMAL HIGH (ref 11.6–15.2)
Prothrombin Time: 21 seconds — ABNORMAL HIGH (ref 11.6–15.2)
Prothrombin Time: 23.4 seconds — ABNORMAL HIGH (ref 11.6–15.2)
Prothrombin Time: 23.8 seconds — ABNORMAL HIGH (ref 11.6–15.2)

## 2010-05-11 LAB — ANAEROBIC CULTURE

## 2010-05-11 LAB — GRAM STAIN

## 2010-05-11 LAB — POCT I-STAT 4, (NA,K, GLUC, HGB,HCT): Sodium: 139 mEq/L (ref 135–145)

## 2010-05-11 LAB — SEDIMENTATION RATE: Sed Rate: 40 mm/hr — ABNORMAL HIGH (ref 0–16)

## 2010-05-11 LAB — C-REACTIVE PROTEIN: CRP: 4.8 mg/dL — ABNORMAL HIGH (ref <0.6)

## 2010-05-11 LAB — SURGICAL PCR SCREEN: Staphylococcus aureus: NEGATIVE

## 2010-05-11 LAB — APTT: aPTT: 41 seconds — ABNORMAL HIGH (ref 24–37)

## 2010-05-14 LAB — GLUCOSE, CAPILLARY
Glucose-Capillary: 157 mg/dL — ABNORMAL HIGH (ref 70–99)
Glucose-Capillary: 163 mg/dL — ABNORMAL HIGH (ref 70–99)
Glucose-Capillary: 194 mg/dL — ABNORMAL HIGH (ref 70–99)
Glucose-Capillary: 208 mg/dL — ABNORMAL HIGH (ref 70–99)
Glucose-Capillary: 218 mg/dL — ABNORMAL HIGH (ref 70–99)
Glucose-Capillary: 267 mg/dL — ABNORMAL HIGH (ref 70–99)
Glucose-Capillary: 99 mg/dL (ref 70–99)

## 2010-05-14 LAB — COMPREHENSIVE METABOLIC PANEL
ALT: 14 U/L (ref 0–53)
Albumin: 2.7 g/dL — ABNORMAL LOW (ref 3.5–5.2)
Alkaline Phosphatase: 51 U/L (ref 39–117)
BUN: 36 mg/dL — ABNORMAL HIGH (ref 6–23)
BUN: 42 mg/dL — ABNORMAL HIGH (ref 6–23)
CO2: 20 mEq/L (ref 19–32)
CO2: 23 mEq/L (ref 19–32)
Calcium: 8.4 mg/dL (ref 8.4–10.5)
Chloride: 107 mEq/L (ref 96–112)
Creatinine, Ser: 1.93 mg/dL — ABNORMAL HIGH (ref 0.4–1.5)
Creatinine, Ser: 2.07 mg/dL — ABNORMAL HIGH (ref 0.4–1.5)
GFR calc non Af Amer: 34 mL/min — ABNORMAL LOW (ref >60)
GFR calc non Af Amer: 37 mL/min — ABNORMAL LOW (ref >60)
Glucose, Bld: 102 mg/dL — ABNORMAL HIGH (ref 70–99)
Glucose, Bld: 231 mg/dL — ABNORMAL HIGH (ref 70–99)
Potassium: 4.5 mEq/L (ref 3.5–5.1)
Sodium: 139 mEq/L (ref 135–145)
Total Bilirubin: 0.3 mg/dL (ref 0.3–1.2)
Total Protein: 6.2 g/dL (ref 6.0–8.3)

## 2010-05-14 LAB — DIFFERENTIAL
Basophils Absolute: 0 10*3/uL (ref 0.0–0.1)
Eosinophils Absolute: 0.1 10*3/uL (ref 0.0–0.7)
Eosinophils Absolute: 0.1 10*3/uL (ref 0.0–0.7)
Eosinophils Absolute: 0.1 10*3/uL (ref 0.0–0.7)
Lymphocytes Relative: 12 % (ref 12–46)
Lymphs Abs: 0.5 10*3/uL — ABNORMAL LOW (ref 0.7–4.0)
Lymphs Abs: 0.5 10*3/uL — ABNORMAL LOW (ref 0.7–4.0)
Lymphs Abs: 0.6 10*3/uL — ABNORMAL LOW (ref 0.7–4.0)
Monocytes Relative: 10 % (ref 3–12)
Neutro Abs: 3.1 10*3/uL (ref 1.7–7.7)
Neutrophils Relative %: 72 % (ref 43–77)
Neutrophils Relative %: 75 % (ref 43–77)
Neutrophils Relative %: 76 % (ref 43–77)

## 2010-05-14 LAB — CBC
HCT: 21.5 % — ABNORMAL LOW (ref 39.0–52.0)
HCT: 22.2 % — ABNORMAL LOW (ref 39.0–52.0)
HCT: 22.6 % — ABNORMAL LOW (ref 39.0–52.0)
HCT: 23.3 % — ABNORMAL LOW (ref 39.0–52.0)
HCT: 25 % — ABNORMAL LOW (ref 39.0–52.0)
Hemoglobin: 7.3 g/dL — ABNORMAL LOW (ref 13.0–17.0)
Hemoglobin: 7.3 g/dL — ABNORMAL LOW (ref 13.0–17.0)
Hemoglobin: 7.4 g/dL — ABNORMAL LOW (ref 13.0–17.0)
Hemoglobin: 7.4 g/dL — ABNORMAL LOW (ref 13.0–17.0)
Hemoglobin: 8 g/dL — ABNORMAL LOW (ref 13.0–17.0)
MCH: 28 pg (ref 26.0–34.0)
MCH: 28.1 pg (ref 26.0–34.0)
MCH: 28.2 pg (ref 26.0–34.0)
MCH: 28.2 pg (ref 26.0–34.0)
MCH: 28.2 pg (ref 26.0–34.0)
MCH: 28.4 pg (ref 26.0–34.0)
MCHC: 33.3 g/dL (ref 30.0–36.0)
MCHC: 33.5 g/dL (ref 30.0–36.0)
MCHC: 33.5 g/dL (ref 30.0–36.0)
MCHC: 33.5 g/dL (ref 30.0–36.0)
MCHC: 34 g/dL (ref 30.0–36.0)
MCV: 83.7 fL (ref 78.0–100.0)
MCV: 84.3 fL (ref 78.0–100.0)
MCV: 84.6 fL (ref 78.0–100.0)
MCV: 85.1 fL (ref 78.0–100.0)
Platelets: 270 10*3/uL (ref 150–400)
Platelets: 281 10*3/uL (ref 150–400)
Platelets: 281 10*3/uL (ref 150–400)
Platelets: 301 10*3/uL (ref 150–400)
Platelets: 302 10*3/uL (ref 150–400)
Platelets: 324 10*3/uL (ref 150–400)
Platelets: 338 10*3/uL (ref 150–400)
RBC: 2.58 MIL/uL — ABNORMAL LOW (ref 4.22–5.81)
RBC: 2.63 MIL/uL — ABNORMAL LOW (ref 4.22–5.81)
RBC: 2.63 MIL/uL — ABNORMAL LOW (ref 4.22–5.81)
RBC: 2.65 MIL/uL — ABNORMAL LOW (ref 4.22–5.81)
RBC: 2.73 MIL/uL — ABNORMAL LOW (ref 4.22–5.81)
RDW: 16.6 % — ABNORMAL HIGH (ref 11.5–15.5)
RDW: 16.8 % — ABNORMAL HIGH (ref 11.5–15.5)
RDW: 16.8 % — ABNORMAL HIGH (ref 11.5–15.5)
RDW: 17 % — ABNORMAL HIGH (ref 11.5–15.5)
RDW: 17.1 % — ABNORMAL HIGH (ref 11.5–15.5)
RDW: 17.2 % — ABNORMAL HIGH (ref 11.5–15.5)
RDW: 17.3 % — ABNORMAL HIGH (ref 11.5–15.5)
WBC: 4.4 10*3/uL (ref 4.0–10.5)
WBC: 4.6 10*3/uL (ref 4.0–10.5)
WBC: 4.7 10*3/uL (ref 4.0–10.5)
WBC: 5.6 10*3/uL (ref 4.0–10.5)

## 2010-05-14 LAB — CROSSMATCH

## 2010-05-14 LAB — PREPARE RBC (CROSSMATCH)

## 2010-05-14 LAB — BASIC METABOLIC PANEL
CO2: 23 mEq/L (ref 19–32)
Calcium: 8.4 mg/dL (ref 8.4–10.5)
Chloride: 105 mEq/L (ref 96–112)
Creatinine, Ser: 1.77 mg/dL — ABNORMAL HIGH (ref 0.4–1.5)
GFR calc Af Amer: 49 mL/min — ABNORMAL LOW (ref >60)
GFR calc Af Amer: 54 mL/min — ABNORMAL LOW (ref >60)
Potassium: 4.4 mEq/L (ref 3.5–5.1)
Sodium: 135 mEq/L (ref 135–145)

## 2010-05-14 LAB — PREPARE FRESH FROZEN PLASMA

## 2010-05-14 LAB — TSH: TSH: 4.213 u[IU]/mL (ref 0.350–4.500)

## 2010-05-14 LAB — PHOSPHORUS
Phosphorus: 2.4 mg/dL (ref 2.3–4.6)
Phosphorus: 2.8 mg/dL (ref 2.3–4.6)

## 2010-05-14 LAB — PROTIME-INR
INR: 9.86 (ref 0.00–1.49)
INR: >10 (ref 0.00–1.49)
Prothrombin Time: 78.3 seconds — ABNORMAL HIGH (ref 11.6–15.2)
Prothrombin Time: 88.8 seconds — ABNORMAL HIGH (ref 11.6–15.2)

## 2010-05-14 LAB — MAGNESIUM: Magnesium: 1.9 mg/dL (ref 1.5–2.5)

## 2010-05-14 LAB — ABO/RH: ABO/RH(D): O POS

## 2010-05-15 NOTE — Op Note (Signed)
[name], [name] NO.:  [id number]  MEDICAL RECORD NO.:  [id number]           PATIENT TYPE:  I  LOCATION:  [id number]                         FACILITY:  [facility]  PHYSICIAN:  Newt Minion, MD     DATE OF BIRTH:  20-Apr-1955  DATE OF PROCEDURE:  04/26/2010 DATE OF DISCHARGE:                              OPERATIVE REPORT   PREOPERATIVE DIAGNOSIS:  Septic left total knee arthroplasty.  POSTOPERATIVE DIAGNOSIS:  Septic left total knee arthroplasty.  PROCEDURES: 1. Irrigation and debridement skin, soft tissue, bone, and muscle. 2. Removal of total knee arthroplasty. 3. Implantation of a total knee arthroplasty manufactured from     antibiotic cement with both the tibial and femoral components     replaced.  SURGEON:  Newt Minion, MD  ANESTHESIA:  General.  ESTIMATED BLOOD LOSS:  Minimal.  ANTIBIOTICS:  Vancomycin 1 g IV after cultures were obtained, placement of antibiotic cement of 4 bags of Palacos cement with 500 mg of gentamicin in each bag and vancomycin 1 gram for each bag of cement. Cultures obtained x4.  FINDINGS:  Large effusion, abscess in the left knee.  TOURNIQUET TIME:  Less than 1 hour.  PCR positive for MRSA.  DISPOSITION:  To PACU in stable condition.  INDICATION OF PROCEDURE:  The patient is a 55 year old gentleman status post total knee arthroplasty on the left.  The patient has had multiple aspirations which were all negative.  He has had an irrigation and debridement of a fluid collection lateral aspect of his knee, which was also negative.  Due to a recurrent effusion and the last aspiration which was purulent despite negative cultures, the patient presents at this time for surgical intervention.  Risks and benefits were discussed including persistent infection, neurovascular injury, fracture of the bone, nonhealing, need for additional surgery.  The patient states he understands and wishes to proceed at this time.  DESCRIPTION  OF PROCEDURE:  The patient was brought to OR room 1, and underwent general anesthetic.  After adequate level of anesthesia obtained, the patient's left lower extremity was prepped using DuraPrep, draped into sterile field.  [name] was used to cover all exposed skin.  A midline incision was made.  This was carried down to the medial parapatellar retinacular incision.  The patient had a large purulent abscess, cultures were obtained x4.  The patellar, femoral, and tibial components were removed.  All cement was removed.  The knee was irrigated with pulse lavage for 9 liters.  The knee was then trialed with different sizes of femoral components and the appropriate fitting femoral component and tibial component were then manufactured on the back table into an antibiotic component.  These were made with a total of 3 bags of Palacos which each had 500 mg of gentamicin in each bag and 1 gram of vancomycin was mixed to each bag.  The antibiotic total knee implants were implanted and cemented with additional bag of Palacos including 500 mg of gentamicin and another gram of vancomycin.  This was allowed to harden with the knee extended.  The tourniquet was deflated after less than 1  hour.  The retinaculum was closed using #1 PDS.  Subcu was closed using 2-0 Vicryl.  Skin was closed Proximate staples.  Wound was covered with Adaptic orthopedic sponges.  ABD dressing, Kerlix, and a compressive wrap was applied to the lower extremity to his venous stasis changes and history of DVT.  A medium Hemovac was placed deep of the wound.  He was extubated and taken to PACU in stable condition.     Newt Minion, MD     MVD/MEDQ  D:  04/26/2010  T:  04/27/2010  Job:  [id number]  Electronically Signed by Meridee Score MD on 05/15/2010 04:27:03 PM

## 2010-05-15 NOTE — Discharge Summary (Signed)
  [name], [name] NO.:  [id number]  MEDICAL RECORD NO.:  [id number]           PATIENT TYPE:  I  LOCATION:  [id number]                         FACILITY:  [facility]  PHYSICIAN:  Newt Minion, MD     DATE OF BIRTH:  Jul 18, 1955  DATE OF ADMISSION:  04/26/2010 DATE OF DISCHARGE:  04/29/2010                              DISCHARGE SUMMARY   FINAL DIAGNOSIS:  Septic left total knee arthroplasty.  Discharged to skilled nursing in stable condition.  Follow up with Dr. Sharol Given in 2 weeks.  Physical Therapy, progressive ambulation, weightbearing as tolerated on the left with a knee immobilizer for ambulation.  The patient may discontinue knee immobilizer while he is in bed.  The patient will need compressive wraps for both lower extremities, change weekly with Unna compressive wrap for both legs. The patient's discharge medications are the same as his admission medications.  The patient will also be given prescriptions for IV vancomycin for 6 weeks to be dosed per pharmacy and Cipro 750 mg p.o. b.i.d. for 6 weeks.  The patient will also be given prescription for Tylox one p.o. q.4 h. p.r.n. for pain.  He will follow up with Dr. Sharol Given in 2 weeks.  He will continue his admission medications including his Coumadin as well as having compressive wraps reapplied bilateral legs weekly.     Newt Minion, MD     MVD/MEDQ  D:  04/29/2010  T:  04/29/2010  Job:  [id number]  Electronically Signed by Meridee Score MD on 05/15/2010 04:26:59 PM

## 2010-05-27 ENCOUNTER — Other Ambulatory Visit (HOSPITAL_COMMUNITY): Payer: Self-pay | Admitting: Internal Medicine

## 2010-05-27 DIAGNOSIS — M009 Pyogenic arthritis, unspecified: Secondary | ICD-10-CM

## 2010-05-30 ENCOUNTER — Ambulatory Visit (HOSPITAL_COMMUNITY)
Admission: RE | Admit: 2010-05-30 | Discharge: 2010-05-30 | Disposition: A | Discharge status: Home / Self Care | Payer: Medicare Other | Source: Ambulatory Visit | Attending: Internal Medicine | Admitting: Internal Medicine

## 2010-05-30 DIAGNOSIS — M009 Pyogenic arthritis, unspecified: Secondary | ICD-10-CM

## 2010-05-30 DIAGNOSIS — Z452 Encounter for adjustment and management of vascular access device: Secondary | ICD-10-CM | POA: Insufficient documentation

## 2010-06-02 LAB — PROTIME-INR
INR: 1.03 (ref 0.00–1.49)
INR: 1.17 (ref 0.00–1.49)
INR: 1.27 (ref 0.00–1.49)
INR: 1.63 — ABNORMAL HIGH (ref 0.00–1.49)
Prothrombin Time: 14.8 seconds (ref 11.6–15.2)
Prothrombin Time: 15.8 seconds — ABNORMAL HIGH (ref 11.6–15.2)
Prothrombin Time: 19.2 seconds — ABNORMAL HIGH (ref 11.6–15.2)

## 2010-06-02 LAB — GLUCOSE, CAPILLARY
Glucose-Capillary: 101 mg/dL — ABNORMAL HIGH (ref 70–99)
Glucose-Capillary: 123 mg/dL — ABNORMAL HIGH (ref 70–99)
Glucose-Capillary: 133 mg/dL — ABNORMAL HIGH (ref 70–99)
Glucose-Capillary: 150 mg/dL — ABNORMAL HIGH (ref 70–99)
Glucose-Capillary: 154 mg/dL — ABNORMAL HIGH (ref 70–99)
Glucose-Capillary: 164 mg/dL — ABNORMAL HIGH (ref 70–99)
Glucose-Capillary: 176 mg/dL — ABNORMAL HIGH (ref 70–99)
Glucose-Capillary: 223 mg/dL — ABNORMAL HIGH (ref 70–99)
Glucose-Capillary: 114 mg/dL — ABNORMAL HIGH (ref 70–99)
Glucose-Capillary: 147 mg/dL — ABNORMAL HIGH (ref 70–99)
Glucose-Capillary: 165 mg/dL — ABNORMAL HIGH (ref 70–99)

## 2010-06-02 LAB — CROSSMATCH

## 2010-06-02 LAB — BODY FLUID CULTURE

## 2010-06-02 LAB — BASIC METABOLIC PANEL
BUN: 14 mg/dL (ref 6–23)
CO2: 26 mEq/L (ref 19–32)
Calcium: 8.4 mg/dL (ref 8.4–10.5)
Calcium: 8.6 mg/dL (ref 8.4–10.5)
Calcium: 8.5 mg/dL (ref 8.4–10.5)
Chloride: 108 mEq/L (ref 96–112)
Creatinine, Ser: 0.95 mg/dL (ref 0.4–1.5)
Creatinine, Ser: 0.96 mg/dL (ref 0.4–1.5)
GFR calc Af Amer: >60 mL/min (ref >60)
GFR calc Af Amer: >60 mL/min (ref >60)
GFR calc Af Amer: >60 mL/min (ref >60)
GFR calc non Af Amer: >60 mL/min (ref >60)
GFR calc non Af Amer: >60 mL/min (ref >60)
GFR calc non Af Amer: >60 mL/min (ref >60)
Glucose, Bld: 160 mg/dL — ABNORMAL HIGH (ref 70–99)
Glucose, Bld: 91 mg/dL (ref 70–99)
Potassium: 4.3 mEq/L (ref 3.5–5.1)
Sodium: 135 mEq/L (ref 135–145)
Sodium: 137 mEq/L (ref 135–145)
Sodium: 139 mEq/L (ref 135–145)
Sodium: 140 mEq/L (ref 135–145)

## 2010-06-02 LAB — CBC
HCT: 20.4 % — ABNORMAL LOW (ref 39.0–52.0)
HCT: 34.3 % — ABNORMAL LOW (ref 39.0–52.0)
Hemoglobin: 7 g/dL — CL (ref 13.0–17.0)
Hemoglobin: 7.6 g/dL — CL (ref 13.0–17.0)
Hemoglobin: 7.8 g/dL — CL (ref 13.0–17.0)
Hemoglobin: 10.6 g/dL — ABNORMAL LOW (ref 13.0–17.0)
MCHC: 33.8 g/dL (ref 30.0–36.0)
MCHC: 34.1 g/dL (ref 30.0–36.0)
MCHC: 34.1 g/dL (ref 30.0–36.0)
MCV: 85.9 fL (ref 78.0–100.0)
MCV: 86.1 fL (ref 78.0–100.0)
Platelets: 139 10*3/uL — ABNORMAL LOW (ref 150–400)
Platelets: 154 10*3/uL (ref 150–400)
Platelets: 180 10*3/uL (ref 150–400)
RBC: 2.69 MIL/uL — ABNORMAL LOW (ref 4.22–5.81)
RBC: 3.63 MIL/uL — ABNORMAL LOW (ref 4.22–5.81)
RDW: 15 % (ref 11.5–15.5)
RDW: 15.4 % (ref 11.5–15.5)
RDW: 15.9 % — ABNORMAL HIGH (ref 11.5–15.5)
WBC: 3.5 10*3/uL — ABNORMAL LOW (ref 4.0–10.5)
WBC: 3.7 10*3/uL — ABNORMAL LOW (ref 4.0–10.5)

## 2010-06-02 LAB — COMPREHENSIVE METABOLIC PANEL
AST: 21 U/L (ref 0–37)
Albumin: 3.6 g/dL (ref 3.5–5.2)
BUN: 16 mg/dL (ref 6–23)
Calcium: 9.7 mg/dL (ref 8.4–10.5)
Chloride: 107 mEq/L (ref 96–112)
Creatinine, Ser: 1 mg/dL (ref 0.4–1.5)
GFR calc Af Amer: >60 mL/min (ref >60)
Total Protein: 6.6 g/dL (ref 6.0–8.3)

## 2010-06-02 LAB — ANAEROBIC CULTURE

## 2010-06-02 LAB — DIFFERENTIAL
Eosinophils Relative: 8 % — ABNORMAL HIGH (ref 0–5)
Lymphocytes Relative: 18 % (ref 12–46)
Lymphs Abs: 0.6 10*3/uL — ABNORMAL LOW (ref 0.7–4.0)
Neutrophils Relative %: 66 % (ref 43–77)

## 2010-06-02 LAB — APTT: aPTT: 28 seconds (ref 24–37)

## 2010-06-02 LAB — GRAM STAIN

## 2010-06-03 ENCOUNTER — Inpatient Hospital Stay (HOSPITAL_COMMUNITY): Payer: Medicare Other

## 2010-06-03 ENCOUNTER — Inpatient Hospital Stay (HOSPITAL_COMMUNITY)
Admission: RE | Admit: 2010-06-03 | Discharge: 2010-06-07 | DRG: 468 | Disposition: A | Discharge status: Skilled Nursing Facility | Payer: Medicare Other | Source: Ambulatory Visit | Attending: Orthopedic Surgery | Admitting: Orthopedic Surgery

## 2010-06-03 DIAGNOSIS — G4733 Obstructive sleep apnea (adult) (pediatric): Secondary | ICD-10-CM | POA: Diagnosis present

## 2010-06-03 DIAGNOSIS — T8450XA Infection and inflammatory reaction due to unspecified internal joint prosthesis, initial encounter: Principal | ICD-10-CM | POA: Diagnosis present

## 2010-06-03 DIAGNOSIS — E119 Type 2 diabetes mellitus without complications: Secondary | ICD-10-CM | POA: Diagnosis present

## 2010-06-03 DIAGNOSIS — Z96659 Presence of unspecified artificial knee joint: Secondary | ICD-10-CM

## 2010-06-03 LAB — CBC
HCT: 30.7 % — ABNORMAL LOW (ref 39.0–52.0)
HCT: 30.4 % — ABNORMAL LOW (ref 39.0–52.0)
HCT: 33.2 % — ABNORMAL LOW (ref 39.0–52.0)
Hemoglobin: 9.8 g/dL — ABNORMAL LOW (ref 13.0–17.0)
Hemoglobin: 10.4 g/dL — ABNORMAL LOW (ref 13.0–17.0)
Hemoglobin: 11.3 g/dL — ABNORMAL LOW (ref 13.0–17.0)
MCH: 28.3 pg (ref 26.0–34.0)
MCHC: 31.9 g/dL (ref 30.0–36.0)
MCHC: 34.3 g/dL (ref 30.0–36.0)
MCV: 88.7 fL (ref 78.0–100.0)
MCV: 86 fL (ref 78.0–100.0)
MCV: 86.5 fL (ref 78.0–100.0)
Platelets: 157 10*3/uL (ref 150–400)
RDW: 15.8 % — ABNORMAL HIGH (ref 11.5–15.5)
RDW: 16 % — ABNORMAL HIGH (ref 11.5–15.5)

## 2010-06-03 LAB — URINE MICROSCOPIC-ADD ON

## 2010-06-03 LAB — COMPREHENSIVE METABOLIC PANEL
ALT: 19 U/L (ref 0–53)
Alkaline Phosphatase: 67 U/L (ref 39–117)
BUN: 21 mg/dL (ref 6–23)
CO2: 23 mEq/L (ref 19–32)
Calcium: 8.7 mg/dL (ref 8.4–10.5)
GFR calc non Af Amer: 55 mL/min — ABNORMAL LOW (ref >60)
Glucose, Bld: 179 mg/dL — ABNORMAL HIGH (ref 70–99)
Glucose, Bld: 155 mg/dL — ABNORMAL HIGH (ref 70–99)
Potassium: 4.3 mEq/L (ref 3.5–5.1)
Sodium: 137 mEq/L (ref 135–145)
Total Protein: 5.4 g/dL — ABNORMAL LOW (ref 6.0–8.3)

## 2010-06-03 LAB — BASIC METABOLIC PANEL
BUN: 23 mg/dL (ref 6–23)
CO2: 30 mEq/L (ref 19–32)
Chloride: 108 mEq/L (ref 96–112)
Glucose, Bld: 151 mg/dL — ABNORMAL HIGH (ref 70–99)
Potassium: 3.9 mEq/L (ref 3.5–5.1)
Sodium: 142 mEq/L (ref 135–145)

## 2010-06-03 LAB — URINALYSIS, ROUTINE W REFLEX MICROSCOPIC
Glucose, UA: NEGATIVE mg/dL
Protein, ur: 30 mg/dL — AB
Specific Gravity, Urine: 1.023 (ref 1.005–1.030)
Urobilinogen, UA: 0.2 mg/dL (ref 0.0–1.0)

## 2010-06-03 LAB — GLUCOSE, CAPILLARY
Glucose-Capillary: 81 mg/dL (ref 70–99)
Glucose-Capillary: 127 mg/dL — ABNORMAL HIGH (ref 70–99)
Glucose-Capillary: 178 mg/dL — ABNORMAL HIGH (ref 70–99)
Glucose-Capillary: 197 mg/dL — ABNORMAL HIGH (ref 70–99)
Glucose-Capillary: 229 mg/dL — ABNORMAL HIGH (ref 70–99)
Glucose-Capillary: 240 mg/dL — ABNORMAL HIGH (ref 70–99)

## 2010-06-03 LAB — SURGICAL PCR SCREEN: Staphylococcus aureus: NEGATIVE

## 2010-06-03 LAB — URINE CULTURE

## 2010-06-03 LAB — PHOSPHORUS: Phosphorus: 4.5 mg/dL (ref 2.3–4.6)

## 2010-06-03 LAB — CULTURE, BLOOD (ROUTINE X 2)
Culture: NO GROWTH
Report Status: 10042010
Report Status: 10042010

## 2010-06-03 LAB — POCT I-STAT, CHEM 8
BUN: 18 mg/dL (ref 6–23)
Calcium, Ion: 1.21 mmol/L (ref 1.12–1.32)
Chloride: 105 mEq/L (ref 96–112)
Potassium: 4 mEq/L (ref 3.5–5.1)
Sodium: 140 mEq/L (ref 135–145)

## 2010-06-03 LAB — DIFFERENTIAL
Basophils Absolute: 0 10*3/uL (ref 0.0–0.1)
Basophils Relative: 1 % (ref 0–1)
Eosinophils Absolute: 0.3 10*3/uL (ref 0.0–0.7)
Eosinophils Relative: 6 % — ABNORMAL HIGH (ref 0–5)
Lymphs Abs: 0.6 10*3/uL — ABNORMAL LOW (ref 0.7–4.0)
Monocytes Absolute: 0.3 10*3/uL (ref 0.1–1.0)
Monocytes Relative: 9 % (ref 3–12)
Neutro Abs: 2.6 10*3/uL (ref 1.7–7.7)
Neutrophils Relative %: 67 % (ref 43–77)

## 2010-06-03 LAB — C-REACTIVE PROTEIN: CRP: 0.6 mg/dL — ABNORMAL HIGH (ref <0.6)

## 2010-06-03 LAB — WOUND CULTURE: Gram Stain: NONE SEEN

## 2010-06-03 LAB — MAGNESIUM: Magnesium: 1.6 mg/dL (ref 1.5–2.5)

## 2010-06-04 LAB — GLUCOSE, CAPILLARY
Glucose-Capillary: 134 mg/dL — ABNORMAL HIGH (ref 70–99)
Glucose-Capillary: 141 mg/dL — ABNORMAL HIGH (ref 70–99)
Glucose-Capillary: 141 mg/dL — ABNORMAL HIGH (ref 70–99)
Glucose-Capillary: 142 mg/dL — ABNORMAL HIGH (ref 70–99)
Glucose-Capillary: 146 mg/dL — ABNORMAL HIGH (ref 70–99)
Glucose-Capillary: 154 mg/dL — ABNORMAL HIGH (ref 70–99)
Glucose-Capillary: 163 mg/dL — ABNORMAL HIGH (ref 70–99)
Glucose-Capillary: 165 mg/dL — ABNORMAL HIGH (ref 70–99)
Glucose-Capillary: 170 mg/dL — ABNORMAL HIGH (ref 70–99)
Glucose-Capillary: 179 mg/dL — ABNORMAL HIGH (ref 70–99)
Glucose-Capillary: 200 mg/dL — ABNORMAL HIGH (ref 70–99)
Glucose-Capillary: 222 mg/dL — ABNORMAL HIGH (ref 70–99)
Glucose-Capillary: 231 mg/dL — ABNORMAL HIGH (ref 70–99)
Glucose-Capillary: 293 mg/dL — ABNORMAL HIGH (ref 70–99)
Glucose-Capillary: 145 mg/dL — ABNORMAL HIGH (ref 70–99)
Glucose-Capillary: 172 mg/dL — ABNORMAL HIGH (ref 70–99)
Glucose-Capillary: 173 mg/dL — ABNORMAL HIGH (ref 70–99)
Glucose-Capillary: 193 mg/dL — ABNORMAL HIGH (ref 70–99)

## 2010-06-04 LAB — POCT I-STAT, CHEM 8
Calcium, Ion: 1.13 mmol/L (ref 1.12–1.32)
Creatinine, Ser: 1.5 mg/dL (ref 0.4–1.5)
Glucose, Bld: 230 mg/dL — ABNORMAL HIGH (ref 70–99)
Hemoglobin: 10.9 g/dL — ABNORMAL LOW (ref 13.0–17.0)
TCO2: 19 mmol/L (ref 0–100)

## 2010-06-04 LAB — DIFFERENTIAL
Basophils Absolute: 0 10*3/uL (ref 0.0–0.1)
Basophils Absolute: 0 10*3/uL (ref 0.0–0.1)
Basophils Relative: 0 % (ref 0–1)
Basophils Relative: 0 % (ref 0–1)
Eosinophils Absolute: 0.1 10*3/uL (ref 0.0–0.7)
Eosinophils Absolute: 0 10*3/uL (ref 0.0–0.7)
Eosinophils Relative: 0 % (ref 0–5)
Eosinophils Relative: 0 % (ref 0–5)
Lymphocytes Relative: 6 % — ABNORMAL LOW (ref 12–46)
Lymphs Abs: 0.5 10*3/uL — ABNORMAL LOW (ref 0.7–4.0)
Monocytes Absolute: 0.5 10*3/uL (ref 0.1–1.0)
Monocytes Relative: 7 % (ref 3–12)
Neutro Abs: 3.1 10*3/uL (ref 1.7–7.7)
Neutrophils Relative %: 76 % (ref 43–77)

## 2010-06-04 LAB — BASIC METABOLIC PANEL
BUN: 19 mg/dL (ref 6–23)
BUN: 31 mg/dL — ABNORMAL HIGH (ref 6–23)
CO2: 23 mEq/L (ref 19–32)
Calcium: 8.4 mg/dL (ref 8.4–10.5)
Calcium: 8.3 mg/dL — ABNORMAL LOW (ref 8.4–10.5)
Chloride: 104 mEq/L (ref 96–112)
Chloride: 104 mEq/L (ref 96–112)
Chloride: 106 mEq/L (ref 96–112)
Chloride: 102 mEq/L (ref 96–112)
Creatinine, Ser: 0.87 mg/dL (ref 0.4–1.5)
Creatinine, Ser: 1.46 mg/dL (ref 0.4–1.5)
GFR calc Af Amer: >60 mL/min (ref >60)
GFR calc Af Amer: >60 mL/min (ref >60)
GFR calc Af Amer: >60 mL/min (ref >60)
GFR calc non Af Amer: >60 mL/min (ref >60)
GFR calc non Af Amer: >60 mL/min (ref >60)
GFR calc non Af Amer: 51 mL/min — ABNORMAL LOW (ref >60)
GFR calc non Af Amer: >60 mL/min (ref >60)
Glucose, Bld: 171 mg/dL — ABNORMAL HIGH (ref 70–99)
Glucose, Bld: 173 mg/dL — ABNORMAL HIGH (ref 70–99)
Potassium: 4 mEq/L (ref 3.5–5.1)
Potassium: 4.5 mEq/L (ref 3.5–5.1)
Potassium: 4 mEq/L (ref 3.5–5.1)
Sodium: 130 mEq/L — ABNORMAL LOW (ref 135–145)
Sodium: 136 mEq/L (ref 135–145)
Sodium: 129 mEq/L — ABNORMAL LOW (ref 135–145)

## 2010-06-04 LAB — CBC
HCT: 29.6 % — ABNORMAL LOW (ref 39.0–52.0)
HCT: 28.6 % — ABNORMAL LOW (ref 39.0–52.0)
HCT: 32.1 % — ABNORMAL LOW (ref 39.0–52.0)
Hemoglobin: 10 g/dL — ABNORMAL LOW (ref 13.0–17.0)
Hemoglobin: 9.4 g/dL — ABNORMAL LOW (ref 13.0–17.0)
MCHC: 33.9 g/dL (ref 30.0–36.0)
MCHC: 34.2 g/dL (ref 30.0–36.0)
MCHC: 34.6 g/dL (ref 30.0–36.0)
MCV: 88.7 fL (ref 78.0–100.0)
MCV: 88.8 fL (ref 78.0–100.0)
MCV: 87.2 fL (ref 78.0–100.0)
Platelets: 159 10*3/uL (ref 150–400)
Platelets: 121 10*3/uL — ABNORMAL LOW (ref 150–400)
Platelets: 96 10*3/uL — ABNORMAL LOW (ref 150–400)
RBC: 3.13 MIL/uL — ABNORMAL LOW (ref 4.22–5.81)
RBC: 3.69 MIL/uL — ABNORMAL LOW (ref 4.22–5.81)
RDW: 15.9 % — ABNORMAL HIGH (ref 11.5–15.5)
RDW: 16.1 % — ABNORMAL HIGH (ref 11.5–15.5)
RDW: 16.7 % — ABNORMAL HIGH (ref 11.5–15.5)
WBC: 5 10*3/uL (ref 4.0–10.5)
WBC: 9.7 10*3/uL (ref 4.0–10.5)

## 2010-06-04 LAB — CREATININE, URINE, RANDOM: Creatinine, Urine: 200.1 mg/dL

## 2010-06-04 LAB — RENAL FUNCTION PANEL
BUN: 17 mg/dL (ref 6–23)
CO2: 22 mEq/L (ref 19–32)
Calcium: 8.3 mg/dL — ABNORMAL LOW (ref 8.4–10.5)
Chloride: 103 mEq/L (ref 96–112)
Creatinine, Ser: 0.91 mg/dL (ref 0.4–1.5)
Glucose, Bld: 121 mg/dL — ABNORMAL HIGH (ref 70–99)

## 2010-06-04 LAB — HEPATIC FUNCTION PANEL
Bilirubin, Direct: 0.4 mg/dL — ABNORMAL HIGH (ref 0.0–0.3)
Indirect Bilirubin: 0.8 mg/dL (ref 0.3–0.9)
Total Bilirubin: 1.2 mg/dL (ref 0.3–1.2)

## 2010-06-04 LAB — URIC ACID: Uric Acid, Serum: 6.2 mg/dL (ref 4.0–7.8)

## 2010-06-04 LAB — CULTURE, BLOOD (ROUTINE X 2): Culture: NO GROWTH

## 2010-06-04 LAB — HEMOGLOBIN A1C: Hgb A1c MFr Bld: 6.5 % — ABNORMAL HIGH (ref 4.6–6.1)

## 2010-06-05 LAB — GLUCOSE, CAPILLARY
Glucose-Capillary: 127 mg/dL — ABNORMAL HIGH (ref 70–99)
Glucose-Capillary: 127 mg/dL — ABNORMAL HIGH (ref 70–99)
Glucose-Capillary: 133 mg/dL — ABNORMAL HIGH (ref 70–99)
Glucose-Capillary: 142 mg/dL — ABNORMAL HIGH (ref 70–99)
Glucose-Capillary: 143 mg/dL — ABNORMAL HIGH (ref 70–99)
Glucose-Capillary: 177 mg/dL — ABNORMAL HIGH (ref 70–99)
Glucose-Capillary: 177 mg/dL — ABNORMAL HIGH (ref 70–99)
Glucose-Capillary: 194 mg/dL — ABNORMAL HIGH (ref 70–99)
Glucose-Capillary: 212 mg/dL — ABNORMAL HIGH (ref 70–99)
Glucose-Capillary: 245 mg/dL — ABNORMAL HIGH (ref 70–99)

## 2010-06-05 LAB — PROTIME-INR
INR: 1.41 (ref 0.00–1.49)
INR: 1.2 (ref 0.00–1.49)
INR: 1.2 (ref 0.00–1.49)
INR: 1.5 (ref 0.00–1.49)
Prothrombin Time: 17.5 seconds — ABNORMAL HIGH (ref 11.6–15.2)
Prothrombin Time: 18.6 seconds — ABNORMAL HIGH (ref 11.6–15.2)

## 2010-06-05 LAB — COMPREHENSIVE METABOLIC PANEL
ALT: 18 U/L (ref 0–53)
Albumin: 3.4 g/dL — ABNORMAL LOW (ref 3.5–5.2)
Calcium: 9.9 mg/dL (ref 8.4–10.5)
Glucose, Bld: 135 mg/dL — ABNORMAL HIGH (ref 70–99)
Sodium: 138 mEq/L (ref 135–145)
Total Protein: 6.1 g/dL (ref 6.0–8.3)

## 2010-06-05 LAB — VANCOMYCIN, TROUGH: Vancomycin Tr: 20.8 ug/mL — ABNORMAL HIGH (ref 10.0–20.0)

## 2010-06-05 LAB — BASIC METABOLIC PANEL
CO2: 26 mEq/L (ref 19–32)
Chloride: 103 mEq/L (ref 96–112)
Glucose, Bld: 130 mg/dL — ABNORMAL HIGH (ref 70–99)
Potassium: 3.8 mEq/L (ref 3.5–5.1)
Sodium: 139 mEq/L (ref 135–145)

## 2010-06-05 LAB — CBC
HCT: 34 % — ABNORMAL LOW (ref 39.0–52.0)
Hemoglobin: 11.1 g/dL — ABNORMAL LOW (ref 13.0–17.0)
Hemoglobin: 11.3 g/dL — ABNORMAL LOW (ref 13.0–17.0)
MCHC: 32.7 g/dL (ref 30.0–36.0)
MCHC: 33.6 g/dL (ref 30.0–36.0)
Platelets: 178 10*3/uL (ref 150–400)
RDW: 16.9 % — ABNORMAL HIGH (ref 11.5–15.5)
RDW: 17 % — ABNORMAL HIGH (ref 11.5–15.5)

## 2010-06-05 LAB — DIFFERENTIAL
Eosinophils Absolute: 0.2 10*3/uL (ref 0.0–0.7)
Lymphs Abs: 0.8 10*3/uL (ref 0.7–4.0)
Monocytes Relative: 9 % (ref 3–12)
Neutro Abs: 4.1 10*3/uL (ref 1.7–7.7)
Neutrophils Relative %: 73 % (ref 43–77)

## 2010-06-05 LAB — APTT: aPTT: 28 seconds (ref 24–37)

## 2010-06-05 LAB — TSH: TSH: 5.66 u[IU]/mL — ABNORMAL HIGH (ref 0.350–4.500)

## 2010-06-05 NOTE — Op Note (Signed)
[name], [name]               ACCOUNT NO.:  [id number]  MEDICAL RECORD NO.:  [id number]           PATIENT TYPE:  I  LOCATION:  [id number]                         FACILITY:  [facility]  PHYSICIAN:  Newt Minion, MD     DATE OF BIRTH:  Sep 20, 1955  DATE OF PROCEDURE:  06/03/2010 DATE OF DISCHARGE:                              OPERATIVE REPORT   PREOPERATIVE DIAGNOSIS:  Infected left total knee arthroplasty, status post Stage placement of an amniotic total knee antibiotic anatomic spacer.  FINAL DIAGNOSIS:  Infected left total knee arthroplasty, status post Stage placement of an antibiotic total knee antibiotic anatomic spacer.  PROCEDURE:  Revision left total knee arthroplasty with removal of the antibiotic anatomic spacer and placement of Zimmer stem and constrained total knee arthroplasty with a size F femur, 17 x 100 mm stem, a size 6 tibia with a 14 x 100 mm stem with a 23 mm constrained poly.  SURGEON.:  Newt Minion, MD  ANESTHESIA:  General plus femoral block.  ESTIMATED BLOOD LOSS:  Minimal.  ANTIBIOTICS:  1 g of Kefzol.  The patient was on vancomycin and Cipro preoperatively.  DRAINS:  None.  COMPLICATIONS:  None.  DISPOSITION:  To PACU in stable condition.  INDICATIONS FOR PROCEDURE:  The patient is a 54 year old gentleman who developed an infection in his total knee arthroplasty on the left.  He underwent a Stage surgery with removal of the total knee and placement of anatomic antibiotic spacer.  The patient has been on IV vancomycin and Cipro and presents at this time for revision to total knee arthroplasty.  Risks and benefits were discussed including infection, neurovascular injury, persistent pain, recurrent infection, possible risk of fracture of the bone, need for additional surgery, risk for DVT and pulmonary embolus.  The patient states he understands and wished to proceed at this time.  The patient's INR was 1.8 at the time of surgery.  DESCRIPTION OF  PROCEDURE:  The patient was brought to the OR room 4 and underwent general anesthetic.  After adequate level of anesthesia was obtained, the patient's left lower extremity was prepped using DuraPrep, draped in a sterile field.  [name] was used to cover all exposed skin. His previous midline incision was made.  This carried down to the medial parapatellar retinacular incision.  The patella was everted.  The joint was debrided extensively.  The antibiotic anatomic spaces were removed. Attention was first focused on the femur.  This femur was sequentially broached up to a size 17.  Distal cut was made off the 17.  This was followed by a box cut and the chamfer cuts with the alignment guides. Attention was then focused on the tibia.  The tibia was sequentially broached up to a 14 and the tibial cuts were made off the 14 broach. The wound was irrigated with normal saline with pulsatile lavage throughout the case.  The trial components were placed and the knee was trialed with different poly thicknesses, the 23-mm constrained poly had the best stability.  The trial components were removed.  The knee was irrigated with pulsatile lavage.  All loose cement  was removed.  The tibial and femoral components were cemented in place followed by the placement of the constrained tibial tray.  This was secured in place. All loose cement was removed.  The knee was left in extension until the cement hardened.  He was placed through a full range of motion and the patella tracked midline.  The patella had a smooth surface to the patella and this was not resurfaced.  The patella was tracking midline. The tourniquet was deflated up to 70 minutes.  Hemostasis was obtained. The retinaculum was closed using #1 PDS.  The subcu was closed using 0 Vicryl.  The skin was closed using  Proximate staples.  The wound was covered with Adaptic orthopedic sponges, ABD dressing, Webril and a Coban dressing.  The patient had  significant venous stasis edema and pending ulceration in the left lower extremity and a compressive wrap was applied from the metatarsal heads to proximal to the knee to provide compression for the venous stasis edema.  Plan for discharge back to skilled nursing.     Newt Minion, MD     MVD/MEDQ  D:  06/03/2010  T:  06/04/2010  Job:  [id number]  Electronically Signed by Meridee Score MD on 06/05/2010 08:15:15 PM

## 2010-06-06 LAB — CBC
HCT: 38.9 % — ABNORMAL LOW (ref 39.0–52.0)
Platelets: 193 10*3/uL (ref 150–400)
RDW: 17.9 % — ABNORMAL HIGH (ref 11.5–15.5)

## 2010-06-06 LAB — URINALYSIS, ROUTINE W REFLEX MICROSCOPIC
Glucose, UA: NEGATIVE mg/dL
Specific Gravity, Urine: 1.023 (ref 1.005–1.030)
Urobilinogen, UA: 0.2 mg/dL (ref 0.0–1.0)
pH: 5 (ref 5.0–8.0)

## 2010-06-06 LAB — DIFFERENTIAL
Basophils Absolute: 0 10*3/uL (ref 0.0–0.1)
Eosinophils Absolute: 0.3 10*3/uL (ref 0.0–0.7)
Eosinophils Relative: 5 % (ref 0–5)
Lymphocytes Relative: 15 % (ref 12–46)

## 2010-06-06 LAB — BASIC METABOLIC PANEL
BUN: 25 mg/dL — ABNORMAL HIGH (ref 6–23)
Calcium: 8.5 mg/dL (ref 8.4–10.5)
Creatinine, Ser: 1.32 mg/dL (ref 0.4–1.5)
GFR calc non Af Amer: 57 mL/min — ABNORMAL LOW (ref >60)
Glucose, Bld: 158 mg/dL — ABNORMAL HIGH (ref 70–99)

## 2010-06-06 LAB — POCT I-STAT, CHEM 8
Calcium, Ion: 1.32 mmol/L (ref 1.12–1.32)
Glucose, Bld: 136 mg/dL — ABNORMAL HIGH (ref 70–99)
HCT: 39 % (ref 39.0–52.0)
Hemoglobin: 13.3 g/dL (ref 13.0–17.0)
Potassium: 4.2 mEq/L (ref 3.5–5.1)

## 2010-06-06 LAB — URINE MICROSCOPIC-ADD ON

## 2010-06-06 LAB — PROTIME-INR: Prothrombin Time: 38.5 seconds — ABNORMAL HIGH (ref 11.6–15.2)

## 2010-06-07 LAB — GLUCOSE, CAPILLARY

## 2010-06-07 LAB — PROTIME-INR
INR: 1.38 (ref 0.00–1.49)
Prothrombin Time: 17.2 seconds — ABNORMAL HIGH (ref 11.6–15.2)

## 2010-06-10 ENCOUNTER — Inpatient Hospital Stay (HOSPITAL_COMMUNITY)
Admission: EM | Admit: 2010-06-10 | Discharge: 2010-06-14 | DRG: 812 | Disposition: A | Discharge status: Skilled Nursing Facility | Payer: Medicare Other | Attending: Internal Medicine | Admitting: Internal Medicine

## 2010-06-10 DIAGNOSIS — T45515A Adverse effect of anticoagulants, initial encounter: Secondary | ICD-10-CM | POA: Diagnosis present

## 2010-06-10 DIAGNOSIS — Z86711 Personal history of pulmonary embolism: Secondary | ICD-10-CM

## 2010-06-10 DIAGNOSIS — Y849 Medical procedure, unspecified as the cause of abnormal reaction of the patient, or of later complication, without mention of misadventure at the time of the procedure: Secondary | ICD-10-CM | POA: Diagnosis present

## 2010-06-10 DIAGNOSIS — E119 Type 2 diabetes mellitus without complications: Secondary | ICD-10-CM | POA: Diagnosis present

## 2010-06-10 DIAGNOSIS — Z96659 Presence of unspecified artificial knee joint: Secondary | ICD-10-CM

## 2010-06-10 DIAGNOSIS — S8000XA Contusion of unspecified knee, initial encounter: Secondary | ICD-10-CM | POA: Diagnosis present

## 2010-06-10 DIAGNOSIS — Z86718 Personal history of other venous thrombosis and embolism: Secondary | ICD-10-CM

## 2010-06-10 DIAGNOSIS — D638 Anemia in other chronic diseases classified elsewhere: Principal | ICD-10-CM | POA: Diagnosis present

## 2010-06-10 DIAGNOSIS — Z794 Long term (current) use of insulin: Secondary | ICD-10-CM

## 2010-06-10 DIAGNOSIS — T8130XA Disruption of wound, unspecified, initial encounter: Secondary | ICD-10-CM | POA: Diagnosis present

## 2010-06-10 DIAGNOSIS — E039 Hypothyroidism, unspecified: Secondary | ICD-10-CM | POA: Diagnosis present

## 2010-06-10 DIAGNOSIS — M549 Dorsalgia, unspecified: Secondary | ICD-10-CM | POA: Diagnosis present

## 2010-06-10 LAB — DIFFERENTIAL
Eosinophils Relative: 4 % (ref 0–5)
Lymphocytes Relative: 16 % (ref 12–46)
Lymphs Abs: 0.7 10*3/uL (ref 0.7–4.0)
Monocytes Absolute: 0.5 10*3/uL (ref 0.1–1.0)

## 2010-06-10 LAB — APTT: aPTT: 40 seconds — ABNORMAL HIGH (ref 24–37)

## 2010-06-10 LAB — CBC
HCT: 20.8 % — ABNORMAL LOW (ref 39.0–52.0)
MCHC: 31.7 g/dL (ref 30.0–36.0)
MCV: 90 fL (ref 78.0–100.0)
RDW: 18.1 % — ABNORMAL HIGH (ref 11.5–15.5)

## 2010-06-10 LAB — POCT I-STAT, CHEM 8
BUN: 26 mg/dL — ABNORMAL HIGH (ref 6–23)
Calcium, Ion: 1.09 mmol/L — ABNORMAL LOW (ref 1.12–1.32)
Chloride: 107 mEq/L (ref 96–112)
Creatinine, Ser: 1.4 mg/dL (ref 0.4–1.5)
Glucose, Bld: 228 mg/dL — ABNORMAL HIGH (ref 70–99)

## 2010-06-10 LAB — VANCOMYCIN, TROUGH: Vancomycin Tr: 11.8 ug/mL (ref 10.0–20.0)

## 2010-06-11 LAB — COMPREHENSIVE METABOLIC PANEL
ALT: 23 U/L (ref 0–53)
Alkaline Phosphatase: 97 U/L (ref 39–117)
BUN: 20 mg/dL (ref 6–23)
CO2: 25 mEq/L (ref 19–32)
Calcium: 8.7 mg/dL (ref 8.4–10.5)
GFR calc non Af Amer: >60 mL/min (ref >60)
Glucose, Bld: 216 mg/dL — ABNORMAL HIGH (ref 70–99)
Sodium: 137 mEq/L (ref 135–145)
Total Protein: 5.9 g/dL — ABNORMAL LOW (ref 6.0–8.3)

## 2010-06-11 LAB — URINALYSIS, ROUTINE W REFLEX MICROSCOPIC
Bilirubin Urine: NEGATIVE
Glucose, UA: NEGATIVE mg/dL
Specific Gravity, Urine: 1.018 (ref 1.005–1.030)
Urobilinogen, UA: 1 mg/dL (ref 0.0–1.0)
pH: 5 (ref 5.0–8.0)

## 2010-06-11 LAB — URINE MICROSCOPIC-ADD ON

## 2010-06-11 LAB — FERRITIN: Ferritin: 617 ng/mL — ABNORMAL HIGH (ref 22–322)

## 2010-06-11 LAB — VITAMIN B12: Vitamin B-12: 218 pg/mL (ref 211–911)

## 2010-06-11 LAB — CBC
MCH: 27.5 pg (ref 26.0–34.0)
MCHC: 30.9 g/dL (ref 30.0–36.0)
Platelets: 189 10*3/uL (ref 150–400)
RDW: 17.6 % — ABNORMAL HIGH (ref 11.5–15.5)

## 2010-06-11 LAB — IRON AND TIBC
Iron: 26 ug/dL — ABNORMAL LOW (ref 42–135)
UIBC: 156 ug/dL

## 2010-06-11 LAB — GLUCOSE, CAPILLARY
Glucose-Capillary: 233 mg/dL — ABNORMAL HIGH (ref 70–99)
Glucose-Capillary: 252 mg/dL — ABNORMAL HIGH (ref 70–99)
Glucose-Capillary: 248 mg/dL — ABNORMAL HIGH (ref 70–99)

## 2010-06-11 LAB — PROTIME-INR
INR: 2.13 — ABNORMAL HIGH (ref 0.00–1.49)
Prothrombin Time: 24 seconds — ABNORMAL HIGH (ref 11.6–15.2)

## 2010-06-11 LAB — MAGNESIUM: Magnesium: 1.8 mg/dL (ref 1.5–2.5)

## 2010-06-11 LAB — PREPARE RBC (CROSSMATCH)

## 2010-06-11 LAB — FOLATE: Folate: >20 ng/mL

## 2010-06-11 LAB — PHOSPHORUS: Phosphorus: 3.6 mg/dL (ref 2.3–4.6)

## 2010-06-12 LAB — CBC
MCH: 28.9 pg (ref 26.0–34.0)
MCHC: 32.6 g/dL (ref 30.0–36.0)
Platelets: 184 10*3/uL (ref 150–400)
RDW: 17.5 % — ABNORMAL HIGH (ref 11.5–15.5)

## 2010-06-12 LAB — PREPARE RBC (CROSSMATCH)

## 2010-06-12 LAB — GLUCOSE, CAPILLARY: Glucose-Capillary: 165 mg/dL — ABNORMAL HIGH (ref 70–99)

## 2010-06-12 LAB — URINE CULTURE: Culture: NO GROWTH

## 2010-06-13 DIAGNOSIS — D649 Anemia, unspecified: Secondary | ICD-10-CM

## 2010-06-13 LAB — DIFFERENTIAL
Basophils Absolute: 0 10*3/uL (ref 0.0–0.1)
Basophils Absolute: 0 10*3/uL (ref 0.0–0.1)
Basophils Absolute: 0 10*3/uL (ref 0.0–0.1)
Basophils Relative: 1 % (ref 0–1)
Basophils Relative: 0 % (ref 0–1)
Basophils Relative: 1 % (ref 0–1)
Basophils Relative: 1 % (ref 0–1)
Eosinophils Relative: 4 % (ref 0–5)
Eosinophils Relative: 3 % (ref 0–5)
Eosinophils Relative: 4 % (ref 0–5)
Lymphocytes Relative: 13 % (ref 12–46)
Lymphocytes Relative: 15 % (ref 12–46)
Lymphs Abs: 0.7 10*3/uL (ref 0.7–4.0)
Lymphs Abs: 0.8 10*3/uL (ref 0.7–4.0)
Monocytes Absolute: 0.5 10*3/uL (ref 0.1–1.0)
Monocytes Absolute: 0.4 10*3/uL (ref 0.1–1.0)
Monocytes Absolute: 0.6 10*3/uL (ref 0.1–1.0)
Monocytes Relative: 10 % (ref 3–12)
Monocytes Relative: 11 % (ref 3–12)
Monocytes Relative: 9 % (ref 3–12)
Neutro Abs: 3.8 10*3/uL (ref 1.7–7.7)
Neutro Abs: 3.9 10*3/uL (ref 1.7–7.7)
Neutro Abs: 4.9 10*3/uL (ref 1.7–7.7)
Neutrophils Relative %: 70 % (ref 43–77)
Neutrophils Relative %: 74 % (ref 43–77)

## 2010-06-13 LAB — COMPREHENSIVE METABOLIC PANEL
ALT: 19 U/L (ref 0–53)
AST: 16 U/L (ref 0–37)
Alkaline Phosphatase: 89 U/L (ref 39–117)
Alkaline Phosphatase: 73 U/L (ref 39–117)
BUN: 12 mg/dL (ref 6–23)
BUN: 16 mg/dL (ref 6–23)
CO2: 25 mEq/L (ref 19–32)
CO2: 29 mEq/L (ref 19–32)
Calcium: 8.6 mg/dL (ref 8.4–10.5)
Chloride: 102 mEq/L (ref 96–112)
Chloride: 103 mEq/L (ref 96–112)
Creatinine, Ser: 0.78 mg/dL (ref 0.4–1.5)
GFR calc Af Amer: >60 mL/min (ref >60)
GFR calc non Af Amer: >60 mL/min (ref >60)
GFR calc non Af Amer: >60 mL/min (ref >60)
Glucose, Bld: 106 mg/dL — ABNORMAL HIGH (ref 70–99)
Glucose, Bld: 125 mg/dL — ABNORMAL HIGH (ref 70–99)
Potassium: 4 mEq/L (ref 3.5–5.1)
Sodium: 136 mEq/L (ref 135–145)
Total Bilirubin: 1.1 mg/dL (ref 0.3–1.2)
Total Bilirubin: 0.8 mg/dL (ref 0.3–1.2)
Total Protein: 6.1 g/dL (ref 6.0–8.3)

## 2010-06-13 LAB — TYPE AND SCREEN
Antibody Screen: NEGATIVE
Unit division: 0
Unit division: 0
Unit division: 0

## 2010-06-13 LAB — CBC
HCT: 29.4 % — ABNORMAL LOW (ref 39.0–52.0)
HCT: 27.2 % — ABNORMAL LOW (ref 39.0–52.0)
HCT: 29.6 % — ABNORMAL LOW (ref 39.0–52.0)
Hemoglobin: 8.3 g/dL — ABNORMAL LOW (ref 13.0–17.0)
Hemoglobin: 8.7 g/dL — ABNORMAL LOW (ref 13.0–17.0)
Hemoglobin: 8.8 g/dL — ABNORMAL LOW (ref 13.0–17.0)
Hemoglobin: 9.3 g/dL — ABNORMAL LOW (ref 13.0–17.0)
MCHC: 32 g/dL (ref 30.0–36.0)
MCHC: 31.5 g/dL (ref 30.0–36.0)
MCHC: 31.7 g/dL (ref 30.0–36.0)
MCHC: 32 g/dL (ref 30.0–36.0)
MCV: 87.8 fL (ref 78.0–100.0)
Platelets: 197 10*3/uL (ref 150–400)
Platelets: 226 10*3/uL (ref 150–400)
Platelets: 264 10*3/uL (ref 150–400)
RBC: 3.41 MIL/uL — ABNORMAL LOW (ref 4.22–5.81)
RDW: 17.5 % — ABNORMAL HIGH (ref 11.5–15.5)
RDW: 17.5 % — ABNORMAL HIGH (ref 11.5–15.5)
RDW: 18.1 % — ABNORMAL HIGH (ref 11.5–15.5)
RDW: 18.2 % — ABNORMAL HIGH (ref 11.5–15.5)
RDW: 18.3 % — ABNORMAL HIGH (ref 11.5–15.5)
WBC: 4.2 10*3/uL (ref 4.0–10.5)
WBC: 5.9 10*3/uL (ref 4.0–10.5)
WBC: 5.2 10*3/uL (ref 4.0–10.5)
WBC: 5.6 10*3/uL (ref 4.0–10.5)

## 2010-06-13 LAB — CARDIAC PANEL(CRET KIN+CKTOT+MB+TROPI)
CK, MB: 1 ng/mL (ref 0.3–4.0)
Relative Index: INVALID (ref 0.0–2.5)
Relative Index: INVALID (ref 0.0–2.5)
Relative Index: INVALID (ref 0.0–2.5)
Total CK: 21 U/L (ref 7–232)
Troponin I: 0.02 ng/mL (ref 0.00–0.06)
Troponin I: 0.02 ng/mL (ref 0.00–0.06)

## 2010-06-13 LAB — GLUCOSE, CAPILLARY
Glucose-Capillary: 122 mg/dL — ABNORMAL HIGH (ref 70–99)
Glucose-Capillary: 100 mg/dL — ABNORMAL HIGH (ref 70–99)
Glucose-Capillary: 118 mg/dL — ABNORMAL HIGH (ref 70–99)
Glucose-Capillary: 140 mg/dL — ABNORMAL HIGH (ref 70–99)
Glucose-Capillary: 162 mg/dL — ABNORMAL HIGH (ref 70–99)
Glucose-Capillary: 167 mg/dL — ABNORMAL HIGH (ref 70–99)
Glucose-Capillary: 231 mg/dL — ABNORMAL HIGH (ref 70–99)
Glucose-Capillary: 83 mg/dL (ref 70–99)
Glucose-Capillary: 157 mg/dL — ABNORMAL HIGH (ref 70–99)

## 2010-06-13 LAB — URINALYSIS, ROUTINE W REFLEX MICROSCOPIC
Glucose, UA: NEGATIVE mg/dL
Hgb urine dipstick: NEGATIVE
Nitrite: NEGATIVE

## 2010-06-13 LAB — PROTIME-INR
INR: 1.2 (ref 0.00–1.49)
INR: 1.5 (ref 0.00–1.49)
INR: 3.2 — ABNORMAL HIGH (ref 0.00–1.49)
INR: >9.8 (ref 0.00–1.49)
Prothrombin Time: 15.1 seconds (ref 11.6–15.2)
Prothrombin Time: 18.3 seconds — ABNORMAL HIGH (ref 11.6–15.2)
Prothrombin Time: >90 seconds — ABNORMAL HIGH (ref 11.6–15.2)
Prothrombin Time: >90 seconds — ABNORMAL HIGH (ref 11.6–15.2)

## 2010-06-13 LAB — CROSSMATCH

## 2010-06-13 LAB — SEDIMENTATION RATE: Sed Rate: 57 mm/hr — ABNORMAL HIGH (ref 0–16)

## 2010-06-13 LAB — POCT CARDIAC MARKERS
CKMB, poc: 1.3 ng/mL (ref 1.0–8.0)
Myoglobin, poc: 46.5 ng/mL (ref 12–200)

## 2010-06-13 LAB — BASIC METABOLIC PANEL
BUN: 17 mg/dL (ref 6–23)
CO2: 25 mEq/L (ref 19–32)
Calcium: 8.5 mg/dL (ref 8.4–10.5)
Chloride: 106 mEq/L (ref 96–112)
GFR calc non Af Amer: >60 mL/min (ref >60)
GFR calc non Af Amer: >60 mL/min (ref >60)
Glucose, Bld: 193 mg/dL — ABNORMAL HIGH (ref 70–99)
Potassium: 4.1 mEq/L (ref 3.5–5.1)
Sodium: 135 mEq/L (ref 135–145)
Sodium: 140 mEq/L (ref 135–145)

## 2010-06-13 LAB — RETICULOCYTES: Retic Count, Absolute: 86.4 10*3/uL (ref 19.0–186.0)

## 2010-06-13 LAB — IRON AND TIBC
Saturation Ratios: 22 % (ref 20–55)
UIBC: 178 ug/dL

## 2010-06-13 LAB — ABO/RH: ABO/RH(D): O POS

## 2010-06-13 LAB — BRAIN NATRIURETIC PEPTIDE: Pro B Natriuretic peptide (BNP): 82.9 pg/mL (ref 0.0–100.0)

## 2010-06-13 LAB — HEMOCCULT GUIAC POC 1CARD (OFFICE): Fecal Occult Bld: NEGATIVE

## 2010-06-13 LAB — FERRITIN: Ferritin: 203 ng/mL (ref 22–322)

## 2010-06-13 LAB — DIRECT ANTIGLOBULIN TEST (NOT AT ARMC): DAT, complement: NEGATIVE

## 2010-06-13 LAB — TECHNOLOGIST SMEAR REVIEW

## 2010-06-13 LAB — HEMOGLOBIN AND HEMATOCRIT, BLOOD
HCT: 29 % — ABNORMAL LOW (ref 39.0–52.0)
HCT: 31.6 % — ABNORMAL LOW (ref 39.0–52.0)
Hemoglobin: 10 g/dL — ABNORMAL LOW (ref 13.0–17.0)

## 2010-06-13 LAB — T4, FREE: Free T4: 0.72 ng/dL — ABNORMAL LOW (ref 0.89–1.80)

## 2010-06-13 LAB — HEMOGLOBIN A1C: Hgb A1c MFr Bld: 7.2 % — ABNORMAL HIGH (ref 4.6–6.1)

## 2010-06-13 LAB — HAPTOGLOBIN: Haptoglobin: 216 mg/dL — ABNORMAL HIGH (ref 16–200)

## 2010-06-14 LAB — PROTIME-INR
INR: 1.72 — ABNORMAL HIGH (ref 0.00–1.49)
Prothrombin Time: 20.3 seconds — ABNORMAL HIGH (ref 11.6–15.2)

## 2010-06-15 NOTE — Consult Note (Signed)
NAMEJADYEL, [name]               ACCOUNT NO.:  [id number]  MEDICAL RECORD NO.:  [id number]           PATIENT TYPE:  I  LOCATION:  5009                         FACILITY:  [facility]  PHYSICIAN:  Lennis P. Marko Plume, M.D.DATE OF BIRTH:  1956/01/04  DATE OF CONSULTATION:  06/13/2010 DATE OF DISCHARGE:                                CONSULTATION   The patient is a 55 year old white male seen in consultation at the request of the Hospitalist Service due to longstanding anemia.  He is actively followed and treated at [facility] in [facility], per the patient his last treatment there in mid February with IV iron and a followup visit scheduled there upcoming.  I have not been able to get records from [facility] today.  The patient was previously followed by my partner, Dr. Marcy Panning from January 2010 through August 2010.  Dr. Humphrey Rolls documented iron deficiency, with hemoglobin then 8.9 to 9.5.  She did not find any evidence of malignancy including by biopsies of adenopathy, which had negative flow cytometry and pathology consistent with inflammatory process.  The patient has had multiple orthopedic procedures and surgeries since 2010, including right lower extremity wound problems fall of 2010, revision of a left total hip arthroplasty in October 2010, and infected left total hip in October 2011, left knee abscess in January 2012, septic left total knee in February 2012, and most recently revision of the left total knee arthroplasty done June 04, 2010.  He has been on Coumadin prior to 2007 for history of DVT and pulmonary emboli, that monitored by his primary physician, Dr. Eligah East.  The patient had been discharged to a nursing facility on June 07, 2010, then readmitted on June 10, 2010, with bleeding from the surgical site at the left knee.  The patient reports "a large puddle of blood" when the wound opened at the nursing facility.  He had heme-positive stools  documented in summer 2011 with an upper endoscopy then by Dr. Scarlette Shorts (July 2011) negative.  Stools have been heme negative during this hospitalization.  I do not find a report of a colonoscopy in the hospital EMR though Dr. Laurelyn Sickle note from February 2010 mentions that this was set up by a physician in [place], and separately in the EMR there is mention that colonoscopy was done in August 2010 by Dr. Ardis Hughs and showed diverticular disease. The patient denies epistaxis, gum bleeding, hematuria, or other bleeding.  He has not had a bone marrow exam done as best I can tell from the history, and certainly this would be extremely difficult due to the patient's morbid obesity.  I would think that it might need to be done by Interventional Radiology, though I am not certain that even the longest needle would be adequate for this procedure.  The patient tells me that he was "not feeling bad" on admission June 10, 2010, with hemoglobin then 6.6 and INR 2.1.  He does report that he has felt better after total of 4 units of packed red cells this admission.  REVIEW OF SYSTEMS:  Chronic dyspnea on exertion, unchanged, none at rest.  Now  no chest pain.  No pain in the lower extremities.  No abdominal pain.  No fever or sweats.  PAST MEDICAL HISTORY: 1. No known drug allergies. 2. Elevated lipids. 3. Hypothyroid on replacement. 4. Morbid obesity. 5. Hypertension. 6. Osteoarthritis. 7. Lymphedema left lower extremity. 8. Chronic back pain. 9. Venous stasis. 10.Chronic right lower extremity ulcer. 11.History of nephrolithiasis. 12.History of scrotal cellulitis. 13.History of the DVT and PE as above. 14.Type 2 diabetes.  SOCIAL HISTORY:  Married.  His home is in [place], though he has been in hospital in a nursing facility for the past year.  On disability.  No tobacco or alcohol.  FAMILY HISTORY:  Positive for colon cancer.  PHYSICAL EXAMINATION:  GENERAL:  He is a morbidly obese  white male sitting in a recliner with his legs down and has some speech impediment with stuttering. VITAL SIGNS:  Temperature 97.8, heart rate 84 and regular, respirations 18, blood pressure 110/67, room air saturation 96%.  No appreciable peripheral adenopathy. HEENT:  Mouth is clear and moist.  Pupils equally reactive, nonicteric. LUNGS:  No wheezes or rales. HEART:  Regular rate and rhythm. ABDOMEN:  Obese, quiet, nothing palpable. EXTREMITIES:  Lower extremities with wrappings and dressings bilaterally.  Peripheral smear reviewed from the sample of June 13, 2010, which is after transfusion of 4 units of packed red cells.  This shows no fragmented cells, some reticulocytes, no immature white cells, platelets not remarkable.  Anemia panel from June 10, 2010, iron of 26, percent sat 14, B12 of 218, folate greater than 20.  Beta natriuretic was 140. Fecal occult blood was negative x1.  DAT was negative for IgG and complement.  ESR elevated at 57.  LDH was 179.  CMET had BUN 21, creatinine of 1.2 for a calculated GFR of greater than 60, total protein 5.9, albumin 2.7, calcium 8.3.  IMPRESSION AND RECOMMENDATIONS: 1. Chronic anemia which is likely multifactorial secondary to chronic     illness, multiple surgical procedures and wounds, documented iron     deficiency:  All being followed and addressed by Hematology in [facility] and the patient appears stable.  I would be comfortable with     his follow up in [facility] following plan to discharge from the     hospital tomorrow. 2. Morbid obesity. 3. Multiple surgical problems with infections, infected hardware, and     other wound problems. 4. Diabetes. 5. Hypothyroidism. 6. Hypertension and elevated lipids and other problems as noted above.  Thank you for the consult.     Lennis P. Marko Plume, M.D.     LPL/MEDQ  D:  06/13/2010  T:  06/14/2010  Job:  [id number]  cc:   Eligah East, MD Newt Minion, MD [facility]  Hematology/Oncology  Electronically Signed by Evlyn Clines M.D. on 06/15/2010 [id number] PM

## 2010-06-17 LAB — CULTURE, BLOOD (ROUTINE X 2): Culture  Setup Time: 201204140358

## 2010-06-18 NOTE — Discharge Summary (Signed)
[name], [name] NO.:  [id number]  MEDICAL RECORD NO.:  [id number]           PATIENT TYPE:  I  LOCATION:  5009                         FACILITY:  [facility]  PHYSICIAN:  Oren Binet, MD    DATE OF BIRTH:  1955-12-12  DATE OF ADMISSION:  06/10/2010 DATE OF DISCHARGE:                        DISCHARGE SUMMARY - REFERRING   PRIMARY ORTHOPEDIC SURGEON:  Newt Minion, MD  PRIMARY CARE PRACTITIONER:  Dr. Reesa Chew.  PRIMARY GASTROENTEROLOGIST:  Dr. Ardis Hughs of [facility].  PRIMARY DISCHARGE DIAGNOSES: 1. Anemia on chronic anemia secondary to chronic illness. 2. Left knee with inferior dehiscence. 3. Status post recent revision of the knee with replacement of the     left knee.  SECONDARY DISCHARGE DIAGNOSES: 1. Anemia of chronic disease. 2. Hypertension. 3. Diabetes. 4. Morbid obesity. 5. History of obstructive sleep apnea. 6. History of chronic lower extremity edema. 7. History of chronic venous stasis in his bilateral lower     extremities. 8. History of deep venous thrombosis and chronic Coumadin therapy. 9. History of chronic pain. 10.History of dyslipidemia. 11.Hypothyroidism. 12.Recent history of numerous procedures to his left knee secondary to     septic arthritis.  DISCHARGE MEDICATIONS: 1. Vancomycin 1250 mg intravenously twice daily to take for a total of     6 weeks from his last admissions that is from June 07, 2010. 2. Coumadin 7.5 mg 1 tablet daily to titrate for an INR between 2 and     3. 3. Acetaminophen 500 mg 1 tablet p.o. q.6 h. p.r.n. 4. Benazepril 20 mg 1 tablet every morning. 5. Chromium picolinate 200 mcg 1 tablet p.o. every morning. 6. Ciprofloxacin 750 mg 1 tablet p.o. twice daily to take for a total     of 6-week from his last admission, for October 2012. 7. Ferrous sulfate 325 mg 1 tablet p.o. 3 times a day with meals. 8. Fish oil 1000 mg 3 capsules p.o. twice daily. 9. Folic acid 1 mg 1 tablet p.o. every  morning. 10.Lasix 40 mg 1 tablet every morning. 11.Garlic extract [id number] mg 2 capsules p.o. every morning. 12.Glyburide/metformin 5/500 one tablet p.o. twice daily. 13.Lantus 20 units subcutaneously twice daily. 14.Levothyroxine 75 mcg 3 tablets p.o. every morning. 15.Metformin 1000 mg 1 tablet p.o. nightly. 16.Multivitamins 1 tablet p.o. every morning. 17.Nabumetone 750 mg 1 tablet p.o. twice daily. 18.Niaspan 750 mg 1 tablet p.o. nightly. 19.NovoLog 2 units subcutaneously 4 times a day. 20.Oxycodone/acetaminophen 5/325, 1-2 tablets p.o. q.4 h. p.r.n. 21.Selenium 50 mcg 1 tablet every morning. 22.Simvastatin 10 mg 1 tablet every evening. 23.Flomax 0.4 mg 1 capsule p.o. every evening. 24.Tradjenta 5 mg 1 tablet every morning. 25.Verapamil extended release 180 mg 1 capsule every morning. 26.Vitamin B12 500 mcg 1 tablet p.o. every morning. 27.Vitamin C 1000 mg 1 tablet p.o. every morning. 28.Vitamin E 400 units 1 capsule p.o. every morning.  CONSULTATIONS: 1. Lind Guest. Ninfa Linden, MD and Newt Minion, MD from     orthopedics. 2. Lennis P. Marko Plume, MD from Hematology/Oncology.  BRIEF HISTORY OF PRESENT ILLNESS:  The patient is a 55 year old morbidly obese male who has undergone numerous orthopedic procedures  on his left knee by Dr. Sharol Given who was discharged last week from this hospital was brought back because of anemia as well as a possible bloody discharge from his left knee post surgical site along with wound dehiscence.  He was then admitted to the hospitalist service for evaluation and treatment.  PERTINENT LABORATORY DATA: 1. Last hemoglobin on June 13, 2010, was 8.7. 2. ESR was 57. 3. Direct antiglobulin test is negative. 4. Fecal occult blood was negative. 5. Haptoglobin was 216. 6. LDH was 179. 7. Blood cultures done on June 10, 2010, were negative so far. 8. Reticulocyte was 2.7. 9. Anemia panel showed an iron of 26, percent saturation of 14,     vitamin B12 of  218 and a ferritin level of 617.  BRIEF HOSPITAL COURSE: 1. Anemia.  The patient came into the hospital with a hemoglobin level     of 6.6.  He was given a total of 4 units of PRBC with his last     hemoglobin being 8.7.  Because of his history of recurrent     transfusions and his hemoglobin not increasing appropriately to 4     units of PRBC transfusion, Hematology was consulted to make sure     that he did not have any underlying bone marrow issue or a     hemolytic process.  They reviewed the peripheral smear and from his     other workup, they thought that this is probably secondary to his     chronic process.  At this point, the patient is being discharged     back to a skilled nursing facility to continue to follow up with     his primary hematologist in [facility] for continued monitoring of     his hemoglobin and further workup including a bone marrow biopsy if     necessary down the road. 2. Status post left knee replacement.  This patient has had a numerous     procedures on his left knee following a septic arthritis.  All of     these issues were addressed by Orthopedics.  He still requires IV     antibiotics for a total of 6 weeks from his last admission.     Orthopedics has cleared this patient for discharge as well.  He     will follow up with Dr. Sharol Given within 1 week upon discharge.  Wound     care instructions are to change to [place] to bilateral lower     extremities secondary to edema and his chronic leg and wound     issues. 3. Diabetes.  This was stable.  The patient was continued on all of     his medications and will be discharged on his usual medications.     4.  Rest of his medical issues was stable.  DISPOSITION:  It is felt that this patient is stable to be discharged back to his skilled nursing facility today.  FOLLOWUP INSTRUCTIONS: 1. The patient will need to follow up with Dr. Sharol Given within 1-2 weeks     upon discharge. 2. His dose of Coumadin has been  increased to 7.5 mg today.  His INR     is 1.72.  His INR will need to be checked daily or every other day     till it is between 2 and 3. 3. The patient will need follow up with Dr. Jacqulyn Ducking at [facility] in [facility]  for continued care of his anemia.  Total time spent coordinating discharge equals 45 minutes.     Oren Binet, MD     SG/MEDQ  D:  06/14/2010  T:  06/14/2010  Job:  [id number]  cc:   Newt Minion, MD Dr. Royal Hawthorn, M.D.  Electronically Signed by Oren Binet  on 06/18/2010 12:30:11 PM

## 2010-06-21 NOTE — Discharge Summary (Signed)
  [name], [name]               ACCOUNT NO.:  [id number]  MEDICAL RECORD NO.:  [id number]           PATIENT TYPE:  I  LOCATION:  [id number]                         FACILITY:  [facility]  PHYSICIAN:  Newt Minion, MD     DATE OF BIRTH:  04/09/1955  DATE OF ADMISSION:  06/03/2010 DATE OF DISCHARGE:  06/07/2010                              DISCHARGE SUMMARY   FINAL DIAGNOSIS:  Septic left total knee arthroplasty.  SURGICAL PROCEDURE:  Revision left total knee arthroplasty with replacement of antibiotic, anatomic spacer, and the left knee replaced with a revised total knee arthroplasty with antibiotic cement with both vancomycin and gentamicin.  MEDICATIONS:  As per his medical reconciliation form.  The patient is to continue all his admission medications which include both vancomycin and Cipro.  The patient is to continue the IV and p.o. antibiotics for an additional 3 weeks.  The patient's prescription for oxycodone was written for 1-2 p.o. q.4 h. p.r.n. for pain.  The patient is to continue Coumadin for DVT prophylaxis.  WOUND CARE:  The patient will require 2 times a week Unna compressive wraps for both lower extremities and Mepilex dressing to the surgical incision of the left knee.  Unna compressive wraps for both lower extremities from the metatarsal heads to the tibial tubercle with Unna paste and Coban wrap.  The patient will require physical therapy for progressive ambulation, weightbearing as tolerated as well as range of motion with extension and flexion exercises for the left knee.  Continue Coumadin for DVT prophylaxis.  Follow up with Dr. Sharol Given in 2 weeks to harvest the staples.  DISPOSITION:  Discharged to skilled nursing in stable condition.     Newt Minion, MD     MVD/MEDQ  D:  06/07/2010  T:  06/07/2010  Job:  [id number]  Electronically Signed by Meridee Score MD on 06/21/2010 02:55:12 PM

## 2010-07-04 ENCOUNTER — Inpatient Hospital Stay (HOSPITAL_COMMUNITY): Payer: Medicare Other

## 2010-07-04 ENCOUNTER — Encounter (HOSPITAL_COMMUNITY): Payer: Self-pay

## 2010-07-04 ENCOUNTER — Inpatient Hospital Stay (HOSPITAL_COMMUNITY)
Admission: EM | Admit: 2010-07-04 | Discharge: 2010-07-06 | DRG: 669 | Disposition: A | Discharge status: Skilled Nursing Facility | Payer: Medicare Other | Attending: Internal Medicine | Admitting: Internal Medicine

## 2010-07-04 ENCOUNTER — Emergency Department (HOSPITAL_COMMUNITY): Payer: Medicare Other

## 2010-07-04 DIAGNOSIS — R599 Enlarged lymph nodes, unspecified: Secondary | ICD-10-CM | POA: Diagnosis present

## 2010-07-04 DIAGNOSIS — I82509 Chronic embolism and thrombosis of unspecified deep veins of unspecified lower extremity: Secondary | ICD-10-CM | POA: Diagnosis present

## 2010-07-04 DIAGNOSIS — Z7901 Long term (current) use of anticoagulants: Secondary | ICD-10-CM

## 2010-07-04 DIAGNOSIS — N179 Acute kidney failure, unspecified: Secondary | ICD-10-CM | POA: Diagnosis present

## 2010-07-04 DIAGNOSIS — G4733 Obstructive sleep apnea (adult) (pediatric): Secondary | ICD-10-CM | POA: Diagnosis present

## 2010-07-04 DIAGNOSIS — IMO0001 Reserved for inherently not codable concepts without codable children: Secondary | ICD-10-CM | POA: Diagnosis present

## 2010-07-04 DIAGNOSIS — N201 Calculus of ureter: Principal | ICD-10-CM | POA: Diagnosis present

## 2010-07-04 DIAGNOSIS — Z96659 Presence of unspecified artificial knee joint: Secondary | ICD-10-CM

## 2010-07-04 DIAGNOSIS — Z96649 Presence of unspecified artificial hip joint: Secondary | ICD-10-CM

## 2010-07-04 DIAGNOSIS — D649 Anemia, unspecified: Secondary | ICD-10-CM | POA: Diagnosis present

## 2010-07-04 DIAGNOSIS — I2782 Chronic pulmonary embolism: Secondary | ICD-10-CM | POA: Diagnosis present

## 2010-07-04 DIAGNOSIS — E039 Hypothyroidism, unspecified: Secondary | ICD-10-CM | POA: Diagnosis present

## 2010-07-04 LAB — GLUCOSE, CAPILLARY
Glucose-Capillary: 317 mg/dL — ABNORMAL HIGH (ref 70–99)
Glucose-Capillary: 320 mg/dL — ABNORMAL HIGH (ref 70–99)

## 2010-07-04 LAB — URINALYSIS, ROUTINE W REFLEX MICROSCOPIC
Bilirubin Urine: NEGATIVE
Nitrite: NEGATIVE
Specific Gravity, Urine: 1.018 (ref 1.005–1.030)
Urobilinogen, UA: 0.2 mg/dL (ref 0.0–1.0)
pH: 5 (ref 5.0–8.0)

## 2010-07-04 LAB — PROTIME-INR: INR: 2.16 — ABNORMAL HIGH (ref 0.00–1.49)

## 2010-07-04 LAB — MRSA PCR SCREENING: MRSA by PCR: NEGATIVE

## 2010-07-04 LAB — CBC
HCT: 27.4 % — ABNORMAL LOW (ref 39.0–52.0)
Hemoglobin: 8.6 g/dL — ABNORMAL LOW (ref 13.0–17.0)
MCH: 26.8 pg (ref 26.0–34.0)
MCHC: 31.4 g/dL (ref 30.0–36.0)
RBC: 3.21 MIL/uL — ABNORMAL LOW (ref 4.22–5.81)

## 2010-07-04 LAB — URINE MICROSCOPIC-ADD ON

## 2010-07-04 LAB — POCT I-STAT, CHEM 8
BUN: 32 mg/dL — ABNORMAL HIGH (ref 6–23)
Creatinine, Ser: 1.6 mg/dL — ABNORMAL HIGH (ref 0.4–1.5)
Potassium: 4.6 mEq/L (ref 3.5–5.1)
Sodium: 136 mEq/L (ref 135–145)

## 2010-07-04 LAB — DIFFERENTIAL
Basophils Absolute: 0 10*3/uL (ref 0.0–0.1)
Lymphocytes Relative: 11 % — ABNORMAL LOW (ref 12–46)
Monocytes Absolute: 0.5 10*3/uL (ref 0.1–1.0)
Monocytes Relative: 9 % (ref 3–12)
Neutro Abs: 4.1 10*3/uL (ref 1.7–7.7)
Neutrophils Relative %: 77 % (ref 43–77)

## 2010-07-05 LAB — CBC
Hemoglobin: 8.8 g/dL — ABNORMAL LOW (ref 13.0–17.0)
MCV: 85.4 fL (ref 78.0–100.0)
Platelets: 171 10*3/uL (ref 150–400)
RBC: 3.28 MIL/uL — ABNORMAL LOW (ref 4.22–5.81)
WBC: 6.2 10*3/uL (ref 4.0–10.5)

## 2010-07-05 LAB — BASIC METABOLIC PANEL
BUN: 32 mg/dL — ABNORMAL HIGH (ref 6–23)
CO2: 24 mEq/L (ref 19–32)
Chloride: 97 mEq/L (ref 96–112)
GFR calc Af Amer: 53 mL/min — ABNORMAL LOW (ref >60)
Potassium: 4.7 mEq/L (ref 3.5–5.1)

## 2010-07-05 LAB — GLUCOSE, CAPILLARY
Glucose-Capillary: 259 mg/dL — ABNORMAL HIGH (ref 70–99)
Glucose-Capillary: 334 mg/dL — ABNORMAL HIGH (ref 70–99)

## 2010-07-05 LAB — URINE CULTURE

## 2010-07-05 LAB — PROTIME-INR: INR: 1.57 — ABNORMAL HIGH (ref 0.00–1.49)

## 2010-07-06 LAB — BASIC METABOLIC PANEL
CO2: 25 mEq/L (ref 19–32)
Chloride: 100 mEq/L (ref 96–112)
GFR calc Af Amer: 56 mL/min — ABNORMAL LOW (ref >60)
Potassium: 4.3 mEq/L (ref 3.5–5.1)
Sodium: 134 mEq/L — ABNORMAL LOW (ref 135–145)

## 2010-07-06 NOTE — H&P (Signed)
[name], [name] NO.:  [id number]  MEDICAL RECORD NO.:  [id number]           PATIENT TYPE:  I  LOCATION:  [id number]                         FACILITY:  [facility]  PHYSICIAN:  Murray Hodgkins, MD    DATE OF BIRTH:  [birth year]  DATE OF ADMISSION:  07/04/2010 DATE OF DISCHARGE:                             HISTORY & PHYSICAL   PRIMARY CARE PHYSICIAN:  Dr. Eligah East at [facility] [place].  PRIMARY UROLOGIST:  Lillette Boxer. Dahlstedt, M.D.  CHIEF COMPLAINT:  Kidney stone pain.  HISTORY OF PRESENT ILLNESS:  This is a 55 year old man who presents to the emergency room with right flank pain.  The patient's right flank pain began last night.  It radiated down his flank and into his right testicle.  He describes the pain as being 9/10 in intensity, sharp, and constant in nature.  He came to the emergency room where CT did show an obstructing kidney stone on the right.  He has been evaluated by Urology with plans for stent placement later today or tomorrow.  I have been asked to admit given his multiple comorbidities.  The patient reports no other problems.  He reports that he has otherwise been doing well and his blood pressure has been well-controlled.  REVIEW OF SYSTEMS:  Negative for fever, changes to his vision, sore throat, rash, muscle aches, chest pain, shortness of breath, nausea, vomiting, abdominal pain, diarrhea, dysuria, or bleeding.  PAST MEDICAL HISTORY: 1. Morbid obesity. 2. History of left lower extremity DVT, many years ago. 3. History of pulmonary embolism, many years ago. 4. Anemia on chronic iron infusion, followed by [facility]. 5. Diabetes mellitus type 2, on insulin, uncontrolled by hemoglobin     A1c in April. 6. Obstructive sleep apnea on CPAP at night. 7. Right lower extremity chronic wound/ulcer. 8. Chronic left lower extremity lymphedema. 9. Hypertension. 10.Chronic  pain. 11.Hypothyroidism. 12.Hyperlipidemia. 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 14.Superficial abscess of the left knee. 15.Septic left total knee arthroplasty. 16.Nephrolithiasis.  PAST SURGICAL HISTORY: 1. Bilateral hip replacement. 2. Left total knee replacement x2. 3. Extraarticular left knee infection in the past. 4. Septic left knee infection. 5. Infected hip surgery. 6. Tonsillectomy.  ALLERGIES:  None.  FAMILY HISTORY:  Mother had diabetes.  Father just recently died of Alzheimer.  SOCIAL HISTORY:  He is married.  He is currently at [facility] for rehabilitation.  MEDICATIONS: 1. Ambien 10 mg p.o. q.h.s. as needed. 2. Coumadin 6 mg p.o. q.h.s. 3. Flomax 0.4 mg q.h.s. p.o. 4. Simvastatin 10 mg p.o. q.h.s. 5. Folic acid 1 mg p.o. q.h.s. 6. Lasix 40 mg p.o. q.h.s. 7. Methocarbamol 500 mg p.o. t.i.d. 8. Ferrous sulfate 325 mg 3 times a day with meals. 9. Fish oil 1 g 3 capsules p.o. b.i.d. 10.Nabumetone 750 mg p.o. b.i.d. 11.Glyburide/metformin 5/500 p.o. b.i.d. 12.Ciprofloxacin 750 mg p.o. b.i.d. 13.Vitamin E 400 units p.o. daily. 14.Vitamin C 1000 mg p.o. daily. 15.Vitamin B12 500 mg p.o. daily. 16.Verapamil ER 180 mg p.o. daily. 17.Tradjenta 5 mg p.o. daily. 18.Multivitamin p.o. daily. 19.Selenium 50 mcg p.o. daily. 20.Garlic Extract [id number] mg 2  capsules p.o. daily. 21.Chromium 200 mcg p.o. daily. 22.Benazepril 20 mg p.o. daily. 23.Synthroid 75 mcg 3 tablets p.o. daily. 24.Vancomycin 1500 mg IV q.12 h. injection. 25.Niaspan 750 mg p.o. q.h.s. 26.Metformin 1 g p.o. q.h.s. 27.Oxycodone/acetaminophen 5/325 one to two tablets every 4 hours as     needed for pain. 28.Acetaminophen 500 mg 2 tablets every 6 hours as needed. 29.NovoLog 3 units 4 times daily. 30.NovoLog sliding scale insulin. 31.Lantus 22 units subcutaneous b.i.d.  IMAGING: 1. CT of the abdomen and pelvis without contrast showed an 8 mm mildly     obstructing stone in the  right ureteropelvic junction.  Multiple     additional bilateral intrarenal stones.  No obstruction of the left     kidney.  Increasing abdominal retroperitoneal and pelvic     lymphadenopathy.  Lymphoproliferative disorder should be     considered. 2. CBC is notable for a hemoglobin of 8.8 which appears to be stable.     White blood cell count and platelet count within normal limits. 3. INR is 2.16. 4. I-STAT basic electrolytes reveal BUN of 32 and creatinine of 1.60,     and potassium is 4.6.  Urinalysis is grossly positive.  PHYSICAL EXAMINATION:  VITAL SIGNS:  Blood pressure is 127/59, heart rate 98, temperature 98.0, and respirations 18. GENERAL:  Well-developed and well-nourished man, in no acute distress. He is lying flat on a stretcher in the emergency room. HEAD:  Appears to be normal. EYES:  Sclerae clear.  Pupils are equal, round, and reactive to light. Lids, irises, and conjunctivae appear unremarkable. ENT:  Hearing is grossly normal.  Lips, gums, and tongue appear unremarkable. NECK:  Supple.  No lymphadenopathy or masses.  No thyromegaly. CHEST:  Clear to auscultation bilaterally.  No wheezes, rales, or rhonchi.  There is normal respiratory effort. CARDIOVASCULAR:  Regular rate and rhythm.  No murmur, rub, or gallop. EXTREMITIES:  He has a 4+ left lower extremity lymphedema with chronic skin changes.  The right lower extremity has large bandage over it and has a 1 to 2+ edema. SKIN:  Otherwise appears unremarkable, except in the lower extremities as described. ABDOMEN:  Soft, nontender, and nondistended.  He is morbidly obese.  No masses are appreciated. PSYCHIATRIC:  Grossly normal affect.  Speech is fluent and appropriate.  ASSESSMENT AND PLAN:  This is a 55 year old man who presents with obstructing ureteropelvic junction stone. 1. Right ureteropelvic junction obstructing stone.  I have discussed     the case with Dr. Gaynelle Arabian, who will notify [name] of  this     problem.  Plan is for stent placement.  I have discussed the case     with Dr. Gaynelle Arabian, he does not feel that the patient's Coumadin     needs to be reversed and it is anticipated that a stent will be     placed.  He did recommend, however, holding Coumadin tonight.     Lithotripsy could be considered. 2. Acute renal failure, most certainly secondary to right     ureteropelvic junction obstructing stone.  He is also on Lasix as     well as NSAID therapy and benazepril.  I will hold these     medications, place him on some IV fluids and recheck basic     metabolic panel in the morning; hopefully stent placement will     improve this condition. 3. Positive urinalysis.  The patient is on chronic antibiotic therapy     given his infected  stone, his lack of fever and normal white count,     I will not make any changes to his antibiotics at this point. 4. Chronic anemia.  This appears to be stable and is followed by [facility] as an outpatient. 5. Diabetes mellitus type 2, uncontrolled by hemoglobin A1c test in     April.  We will place him on Lantus, but cut the dose down and     place him on sliding scale insulin.  He will remain n.p.o. Dr.     Arlyn Leak request at this point. 6. History of deep venous thrombosis and pulmonary embolism.  This was     years ago.  I did not find actually any evidence on imaging here in     E-Chart that documents this, I do think that this was quite some     time ago as the patient tells me.  Therefore, I do not see any     issue withholding his Coumadin tonight. 7. History of septic knee arthritis.  He will continue on oral Cipro     and IV vancomycin.  I have notified Dr. Sharol Given of the patient's     admission, although I do not think formal consultation is required     at this point. 8. Morbid obesity. 9. Obstructive sleep apnea.  The patient will continue on CPAP at     night.  CODE STATUS:  The patient is full  code.     Murray Hodgkins, MD     DG/MEDQ  D:  07/04/2010  T:  07/04/2010  Job:  [id number]  cc:   Dr. Will Bonnet. Dahlstedt, M.D. [phone]  [facility]  Newt Minion, MD Fax: [phone]  Electronically Signed by Murray Hodgkins  on 07/06/2010 09:46:42 PM

## 2010-07-08 LAB — GLUCOSE, CAPILLARY

## 2010-07-12 NOTE — H&P (Signed)
NAMETEHRAN, REALI NO.:  [id number]   MEDICAL RECORD NO.:  [id number]          PATIENT TYPE:  EMS   LOCATION:  ED                            FACILITY:  [facility]   PHYSICIAN:  Bonnielee Haff, MD     DATE OF BIRTH:  [birth year]   DATE OF ADMISSION:  03/16/2008  DATE OF DISCHARGE:  LH                              HISTORY & PHYSICAL   PRIMARY PHYSICIAN:  Bonne Dolores, M.D.   ADMITTING DIAGNOSES:  1. Shortness of breath, likely pneumonia.  2. Lower extremity edema.  3. Microcytic anemia.  4. Supratherapeutic international normalized ratio.  5. Morbid obesity.  6. Hypoxia.   CHIEF COMPLAINT:  Shortness of breath.   HISTORY OF PRESENT ILLNESS:  Patient is a 55 year old Caucasian male who  is morbidly obese who was in his usual state of health until Thursday  evening when he started feeling short of breath.  The symptoms  progressively worsened.  He was initially dyspneic on exertion and  subsequently he was dyspneic even at rest.  I could not obtain a history  of orthopnea or PND from him.  He was coughing up yellowish sputum with  some blood mixed with it.  He had a fever of 101 degrees Fahrenheit over  the last couple of days.  Denied any chest pain.  He has been having leg  swelling.  His left leg is more swollen than the right.  He, however,  tells me that he had a left knee replacement done in November 2009.  He  feels that his right leg is also swollen more than usual.  Denies any  weight gain.   He denies any sick contacts.  He has not been on any antibiotics  recently.  He was in his doctor's office on March 04, 2008, requesting  additional pain medications and INR at that time was apparently normal  or within range.   MEDICATIONS AT HOME:  Based on the medication bottles that he has, he is  on:  1. Synthroid 150 mcg daily.  2. He is on ferrous sulfate 325 mg t.i.d.  3. Benazepril HCTZ 20/12.5 daily.  4. Simvastatin 40 mg daily.  5. Verapamil SA  240 mg daily.  6. Warfarin 7 mg daily.  7. Hydromorphone 4 mg 4 times a day as needed.  8. Robaxin 750 mg t.i.d.  9. Glyburide/metformin 2.5/500 b.i.d.   He is not allergic to any medications.   PAST MEDICAL HISTORY:  Positive for type 2 diabetes, sleep apnea on  CPAP, history of hypothyroidism, B12 deficiency, history of DVT and PEs  for which he is on chronic Coumadin, hyperhomocysteinemia, hypertension,  morbid obesity, bilateral hip replacements, left knee replacement  recently, chronic left lower extremity thrombophlebitis, history of  cellulitis.   He is being evaluated for enlarged lymph nodes by an oncologist.  He  also has history of nephrolithiasis.   SOCIAL HISTORY:  He lives in [place] with his wife.  Denies smoking,  alcohol, illicit drug use.  He uses a cane to ambulate.   FAMILY HISTORY:  Positive for colon cancer in  his father which was  diagnosed in the 14s.  Also present is a history of coronary artery  disease and bypass surgeries.   REVIEW OF SYSTEMS:  GENERAL:  Positive for weakness, malaise.  HEENT:  Unremarkable.  CARDIOVASCULAR:  Unremarkable.  RESPIRATORY:  As in HPI.  GI:  Unremarkable.  He denies any black stools or blood in the stools;  although, his stools are dark because of iron pills.  GU:  Unremarkable.  MUSCULOSKELETAL:  As in HPI.  NEUROLOGIC:  Unremarkable.  PSYCHIATRIC:  Unremarkable.  OTHER SYSTEMS:  Unremarkable.   PHYSICAL EXAMINATION:  VITAL SIGNS:  Temperature 97.8, blood pressure  105/51, heart rate 82, respiratory rate 20, saturations initially 86% on  room air, improving to 96% to 97% on 2 L.  GENERAL:  This is a morbidly obese white male in no distress at this  time but appears to be slightly dyspneic.  HEENT:  There is no pallor, no icterus.  Oral mucous membrane is moist.  No oral lesions are noted.  NECK:  Soft and supple.  No thyromegaly is appreciated.  LUNGS:  A few crackles in the right base but mostly clear to   auscultation.  No wheezing is present.  Air entry is diminished at the  bases and there is some dullness to percussion, as well.  CARDIOVASCULAR:  S1, S2 is normal, regular.  No murmurs are appreciated.  No S3, S4.  No rubs, no bruits.  ABDOMEN:  Soft, nontender, nondistended.  Bowel sounds present.  No  masses or organomegaly is appreciated.  LOWER EXTREMITIES:  Show 2+ pitting edema.  Left extremity is larger  than right.  MUSCULOSKELETAL:  Unremarkable.  NEUROLOGIC:  He is alert, oriented x3.  No focal neurological deficits  are present.   LABORATORY DATA:  White count is 6.1.  Has got 81% neutrophils.  No  bands are reported.  Hemoglobin is 8.8, MCV 75, platelet count is 264.  INR was greater than 9.8.  Baseline hemoglobin is between 9 and 10.5.  His electrolytes are okay.  The glucose is 193.  BNP was only 113.  Cardiac markers are negative.  UA was unremarkable.  Chest x-ray shows  bilateral infiltrates.  Radiologists feel it could either be edema or  pneumonia.  EKG has been done and it shows that there is a sinus rhythm,  probably normal axis, though it is difficult to say.  There is low  voltage.  His intervals appear to be in the normal range.  There is poor  R-wave progression.  No acute ST or T-wave changes are noted.   ASSESSMENT:  This is a 55 year old Caucasian male who has medical  issues, as stated earlier, who presents with dyspnea for the last 4 to 5  days.  He has chest x-ray findings suggestive of pulmonary edema,  although his BNP is unremarkable.  He did have fever so this could  likely be pneumonia, as well.  He has supratherapeutic INR, the reason  for which is not very clear.  He has not been on any new medication that  can interact with warfarin.  He has anemia which is also very  concerning.   PLAN:  1. Shortness of breath/dyspnea.  This is likely pneumonia versus      pulmonary edema.  We will give him Levaquin and also give him      Lasix.   Echocardiogram will be obtained which will give [place] more      information.  Lasix  should help his lower extremity edema, as well.      Cardiac panels will be repeated.  Oxygen will be provided.      Continue with continuous positive airway pressure at night time for      his sleep apnea.  2. Anemia, microcytic.  Considering his family history of colon      cancer, he needs to have a colonoscopy soon.  This can be done as      an outpatient; however, we will follow his hemoglobins closely and      transfuse him if he drops below 8.  3. Lower extremity edema.  He likely has some form of heart failure,      though it is difficult to be sure.  He has sleep apnea; he is      obese; he could have pulmonary hypertension, as well.  No utility      in doing a Doppler study, as his INR has been therapeutic and he      does have a chronic history of DVTs.  4. Diabetes type 2.  Continue with his home medications.  5. History of hypertension and hypothyroidism.  Also stable.  6. INRs will be followed closely.  For a supratherapeutic INR, he was      given a dose of vitamin K to get it down to the therapeutic range.      We will check another PT/INR this evening to make sure that the      levels are trending down.      Bonnielee Haff, MD  Electronically Signed     GK/MEDQ  D:  03/16/2008  T:  03/16/2008  Job:  [id number]   cc:   Bonne Dolores, M.D.  Fax: [phone]

## 2010-07-12 NOTE — Op Note (Signed)
[name], [name] NO.:  [id number]   MEDICAL RECORD NO.:  [id number]          PATIENT TYPE:  INP   LOCATION:  5001                         FACILITY:  [facility]   PHYSICIAN:  Newt Minion, MD     DATE OF BIRTH:  1955/11/25   DATE OF PROCEDURE:  01/15/2008  DATE OF DISCHARGE:                               OPERATIVE REPORT   PREOPERATIVE DIAGNOSES:  Osteoarthritis, left knee.   POSTOPERATIVE DIAGNOSIS:  Osteoarthritis, left knee.   PROCEDURE:  Left total knee arthroplasty with DePuy posterior stabilized  components, #5 femur, #4 tibia, 10-mm poly tray with a 35-mm patella.  Extra time was taken for the surgery due to the morbid obesity.   SURGEON:  Newt Minion, MD   ANESTHESIA:  General plus femoral block.   ESTIMATED BLOOD LOSS:  Minimal.   ANTIBIOTICS:  Kefzol 2 g.   DRAINS:  None.   COMPLICATIONS:  None.   TOURNIQUET TIME:  At the thigh at 350 mmHg for 39 minutes.   DISPOSITION:  To PACU in stable condition.   INDICATIONS FOR PROCEDURE:  The patient is a 55 year old gentleman with  multiple medical problems diabetes, he uses a CPAP machine at night,  morbid obesity with massive venous stasis brawny edema, changes to both  legs.  The patient has had progressive arthritis to his left knee.  He  is unable to ambulate due to pain, has pain with activities of daily  living.  He has been medically cleared and presents at this time for a  left total knee arthroplasty.  Risks and benefits were discussed  including infection, neurovascular injury, persistent pain, DVT,  pulmonary embolus, and need for additional surgery.  The patient states  he understands and wished to proceed at this time.   DESCRIPTION OF PROCEDURE:  The patient was brought to OR room #4 after  undergoing a femoral block.  He then underwent a general anesthetic.  After adequate level of anesthesia was obtained, the patient's left  lower extremity is prepped using DuraPrep and draped  in a sterile field  and Ioban was used to cover all exposed skin.  A midline incision was  made, carried down to the medial parapatellar retinacular incision.  The  patella was everted.  The femoral canal was inserted using the drill.  The IM guide was used for the femur set for 5 degrees of valgus and 11  mm taken off the distal femur.  The cutting block was placed and distal  femoral cut was made.  This was sized for size 5, and size 5 cutting  block was placed and the chamfer cuts were made for the size 5 femur.  Attention was then focused on the tibia.  Approximately 10 mm was taken  off the tibia with neutral varus and valgus slope using the posterior  alignment guide.  The tibia tray was then sized for a size 4 and the  size 4 temporary tibial tray was placed.  The box cut guide was then  placed in the femur.  The box cut was made for the size  5 femur.  The  size 5 trial femur was placed.  The lug cuts were made and the 10-mm  poly tray was placed.  The knee was placed through full range of motion.  He had full extension, flexion, and stable with varus and valgus stress.  The instruments were removed.  The patella was resurfaced and a 35-mm  lug cuts were made for the patella.  All bony osteophytes were removed.  All soft tissue was removed from the popliteal fossa.  He had multiple  loose bodies, which were removed.  The tibia, femur, and patella were  then irrigated with pulsatile lavage.  Further debridement was  performed.  The tibia and then femoral components were cemented in  place.  Loose cement was removed and the knee was left in extension with  the tibial tray in place until the cement hardened.  The patella button  was also placed, clamped in place until the cement hardened and the knee  was irrigated with pulsatile lavage.  After the cement hardened, knee  was placed through a full range of motion and the patella tracked to  midline.  There was no subluxation of the  patella.  The retinaculum was  closed using #1 Vicryl, subcu was closed using Vicryl, and skin was  closed approximate staples.  The wound was covered with Adaptic  orthopedic sponges, Kerlix, and Coban.  A compressive wrap was applied  around the entire leg to decrease the venous stasis edema.  Plan for  serial compressive wraps.  Discharge to PACU in stable condition.      Newt Minion, MD  Electronically Signed     MVD/MEDQ  D:  01/15/2008  T:  01/15/2008  Job:  [id number]

## 2010-07-12 NOTE — Discharge Summary (Signed)
NAMEESSIE, [name]               ACCOUNT NO.:  [id number]   MEDICAL RECORD NO.:  [id number]          PATIENT TYPE:  INP   LOCATION:  [place]                         FACILITY:  [facility]   PHYSICIAN:  Helen Hashimoto, MD    DATE OF BIRTH:  Apr 15, 1955   DATE OF ADMISSION:  11/15/2007  DATE OF DISCHARGE:  11/21/2007                               DISCHARGE SUMMARY   DISCHARGE DIAGNOSES:  1. Multilobar pneumonia.  2. Obstructive sleep apnea and the patient is on continuous positive      airway pressure at night time.  3. Diabetes mellitus.  4. Hypothyroidism.  5. History of vitamin B12 deficiency.  6. History of deep venous thrombosis and pulmonary embolism.  The      patient is on chronic Coumadin therapy initially diagnosed in 2004.  7. Hyperhomocysteinemia.  8. Hypertension.  9. Morbid obesity.   DISCHARGE MEDICATIONS:  1. Coumadin 5 mg p.o. once daily.  2. Metformin 500 mg twice daily.  3. Avelox 400 mg p.o. once daily x7 days.  4. Synthroid 100 mcg p.o. once daily.  5. Ferrous sulfate 325 mg once a day.  6. Benazepril/HCTZ 20/12.5 mg once a day.  7. Benzonatate 200 mg 4 times daily as needed.  8. Tussionex twice daily as needed.  9. Zocor 40 mg once a day.  10.Verapamil 40 mg 3 times daily.  11.Lantus 30 units subcutaneously as needed.  12.NovoLog by sliding scale.  13.Metformin 500 mg twice daily.  14.Coumadin 6 mg once a day.   CONSULTATIONS:  None.   PROCEDURE:  None.   DIAGNOSTICS:  1. Chest x-ray on November 15, 2007 showed dense consolidation of the      right lung.  2. Repeat x-ray on November 15, 2007 showed extensive right middle      lobe pneumonia with consolidation.  3. CT angio on November 15, 2007 showed no evidence of PE and showed      dense consolidation throughout much of the right lung especially in      the right upper lobe and right middle lobe with other patchy area      involving both lungs.  Whole picture compatible with multilobar  pneumonia.  4. The repeat chest x-ray on November 20, 2007 showed some      improvement in the multilobar pneumonia.  5. Repeat x-ray on November 22, 2007 showed improvement in the      pneumonia.   HOSPITAL COURSE:  1. This is a 56 year old male who presented to the hospital with      dyspnea, fever and hemoptysis.  The patient was found to have      multilobar pneumonia.  He was admitted to the hospital for further      evaluation.  He was started on Rocephin and Zithromax.  His      cultures were sent.  However, he had 2 blood cultures came to be      negative.  Urine culture showed contamination.  Unable to get      sputum cultures from him during the hospital.  The patient  initially was started on CPAP during night time and oxygen during      day time, and he improved quick.  His oxygen has been tapered down.      At the time of discharge, he was off oxygen and he was saturating      in mid 90s.  I will give him antibiotics for 7 days.  I instructed      him to follow with his doctor in a few days to repeat his x-ray and      to ensure that his pneumonia is improving.  I also instructed to      come back to the ER if he developed more shortness of breath, chest      pain or high fever.  Overall, the patient looks fine and the time      of discharge.  He does not have fever.  His oxygen saturation is      good off the oxygen.  2. History of deep venous thrombosis and pulmonary embolus.  His      Coumadin has been adjusted and he will be discharged on 6 mg of      Coumadin.  His INR is 2.5 on the time of discharge.  3. Hypothyroidism.  The patient was continued on the same dose he was      taking as an outpatient.  His TSH is 5.08.  I instructed him to      continue the same dose and to follow with his primary doctor for      further adjustment.   DISPOSITION:  Otherwise, his other medical conditions remained stable in  the hospital.  The patient was continued on the same  all preadmission  medications.  He was continued on CPAP for sleep apnea during night  time.  He was advised to follow with his primary doctor within a week.   Total assessment time is 1 hour.      Helen Hashimoto, MD  Electronically Signed     NAE/MEDQ  D:  11/22/2007  T:  11/23/2007  Job:  [id number]

## 2010-07-12 NOTE — H&P (Signed)
[name], [name] NO.:  [id number]   MEDICAL RECORD NO.:  [id number]          PATIENT TYPE:  INP   LOCATION:  [id number]                         FACILITY:  [facility]   PHYSICIAN:  Arlyss Repress, MD        DATE OF BIRTH:  Jun 20, 1955   DATE OF ADMISSION:  09/16/2008  DATE OF DISCHARGE:                              HISTORY & PHYSICAL   CHIEF COMPLAINT:  Back pain.   HISTORY OF PRESENT ILLNESS:  A 55 year old male who has complaints of  chronic back pain.  Apparently it is been worse recently.  He was seen  two days ago in the ED for a fall.  He was scheduled to see an  orthopedist today for an epidural steroid injection; however, the  patient was unable to make it due to transportation problems and thought  that it would be beneficial to come to the ER where he could get all of  his health care.  He requests that he be admitted for [facility]  as well as  evaluation of lymph node which he has actually had biopsied twice in the  past.  The patient has no evidence of fever, chills, bowel or bladder  dysfunction, weakness, numbness, or tingling in his legs at the present  time.  He is frustrated by his chronic pain and will be admitted for  evaluation of back pain.   PAST MEDICAL HISTORY:  1. Hypertension.  2. Diabetes.  3. Hyperlipidemia.  4. Hypothyroidism.  5. Osteoarthritis.  6. Sleep apnea.  7. Chronic back pain.  8. History of anemia.  9. 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  12.History of left distal lower extremity cellulitis.  13.Lymphadenopathy.   PAST SURGICAL HISTORY:  1. Bilateral total hip arthroplasty.  2. Left total knee replacement.  3. Lymph node biopsy x2, March 23, 2008, and August 15, 2007, both      negative for malignancy.   SOCIAL HISTORY:  The patient does not smoke or drink.  He is on SSI  disability.  He was previously a Optometrist.   FAMILY HISTORY:  Mother died at age 67 and had diabetes.  Father is  alive at age 10.  He has had a heart attack in the past.  He also has a  history of colon cancer.  He is a former smoker.  Siblings:  One brother  died at age 45 of unknown causes.  Possibly congestive heart failure.   Paternal grandmother has osteoarthritis.   ALLERGIES:  NO KNOWN DRUG ALLERGIES.   MEDICATIONS:  1. Ferrous sulfate 325 mg p.o. t.i.d.  2. Hydromorphone 4 mg p.o. q.4 h p.r.n.  3. Benazepril/hydrochlorothiazide 20/12.5 mg p.o. daily.  4. Diazepam 10 mg p.o. at bedtime.  5. Levothyroxine 175 mcg p.o. daily.  6. Nabumetone 750 mg p.o. b.i.d.  7. Simvastatin 40 mg p.o. daily.  8. Verapamil 240 mg p.o. daily.  9. Warfarin 4 mg p.o. daily.  10.Glyburide/metformin 2.5/500 mg p.o. daily.  11.NovoLog sliding scale.  12.Lantus 35  units subcu at bedtime.   PHYSICAL EXAMINATION:  VITAL SIGNS:  Temperature 98, pulse 99, blood  pressure 88/49, pulse ox 95% on room air.  HEENT: Anicteric, EOMI, no nystagmus.  Pupils 1.5 mm, symmetric, direct,  consensual, near reflexes intact, small oropharynx.  Tongue midline.  NECK:  No JVD, no bruit, no thyromegaly, no adenopathy.  HEART:  Regular rate and rhythm.  S1/S2, no murmurs, gallops or rubs.  LUNGS:  Clear to auscultation bilaterally.  ABDOMEN:  Morbidly obese, soft, nontender, nondistended.  Positive bowel  sounds.  EXTREMITIES:  No cyanosis or clubbing, there are venous stasis changes,  especially in the left greater than right distal lower extremity.  Left  distal lower extremity is larger than the right and this is apparently  chronic issue.  The patient complains of no pain, negative Homans' sign.  NEUROLOGIC:  Cranial nerves II-XII intact, reflexes 2+, symmetric,  diffuse with downgoing toes bilaterally, motor strength 5/5 in all four  extremities.  The patient is able to stand by himself.  He is able to  ambulate with walker.  PSYCHIATRIC:  The patient  appears slow.  He is alert and oriented x3.  He appears to have an overwhelming concern to have his lymph node  biopsied and appears paranoid about this.   LABORATORY DATA:  Sodium 138, potassium 4.1, chloride 106, bicarb 26,  BUN 40, creatinine 1.33.  Glucose 135, AST 18, ALT 18, alkaline 3.4,  calcium 9.9.  PTT 28, PT 16, INR 1.2.  WBC 5.7, hemoglobin 11.3,  platelet count 178.   X-ray of the lumbar spine shows no acute finding, scoliosis, lower  lumbar arthroscopy, extensive bridging, osteophytes probably  representing diffuse idiopathic skeletal hyperostosis.   MRI of lumbosacral spine August 26, 2008 showed multi level disk pathology  as described above with central protrusion at L2-L3 and central left for  protrusion at L4-L5, lower lumbar facet arthropathy, symptomatic neural  compression may be present at L4-L5, retroperitoneal and pelvic  lymphadenopathy redemonstrated.   ASSESSMENT/PLAN:  1. Chronic back pain with protrusion of disk on the left at L4-5 with      L5 nerve root encroachment and lateral recess.  Dilaudid 0.5-1 mg      IV q.4 h p.r.n. pain.  Consult neurosurgery in the a.m. to evaluate      the patient for interventional radiology to possibly performed ESI.  2. History of deep venous thrombosis and PEs.  The patient is on      chronic Coumadin; however, he is being held off his Coumadin      because of need for [facility] and lymph node biopsy per patient.  Will      continue to withhold Coumadin.  Have neurosurgery or interventional      radiology evaluate in the morning.   1. Diabetes:  Continue glyburide, metformin, and Lantus and NovoLog      sliding scale.  2. Hypertension.  Continue benazepril/hydrochlorothiazide as well as      Verapamil 240 mg p.o. daily  3. Hyperlipidemia.  Continue simvastatin 40 mg p.o. daily  4. Hypothyroidism:  Continue Levothyroxine 175 mcg p.o. daily  5. DVT prophylaxis.  SCDs.      Arlyss Repress, MD  Electronically Signed      JYK/MEDQ  D:  09/16/2008  T:  09/17/2008  Job:  [id number]   cc:   [facility]  [place], Mechele Dawley Doreene Nest, MD  Fax: [phone]   Bonne Dolores, M.D.  Fax: [phone]

## 2010-07-12 NOTE — Discharge Summary (Signed)
[name], [name] NO.:  [id number]   MEDICAL RECORD NO.:  [id number]          PATIENT TYPE:  INP   LOCATION:  5031                         FACILITY:  [facility]   PHYSICIAN:  Leana Gamer, MDDATE OF BIRTH:  [birth year]   DATE OF ADMISSION:  10/10/2008  DATE OF DISCHARGE:  10/18/2008                               DISCHARGE SUMMARY   DISCHARGE DISPOSITION:  [facility]   FINAL DISCHARGE DIAGNOSES:  1. Cellulitis of the right lower extremity.  2. Chronic left lower extremity swelling.  3. Diabetes type 2, well controlled.  4. Hypertension, controlled.  5. Chronic back pain,  6. Hypothyroidism.  7. Morbid obesity.  8. History of hyperlipidemia.  9. Obstructive sleep apnea.  10.History of deep vein thrombosis and pulmonary embolus on chronic      Lovenox therapy.  11.Status post bilateral hip replacement.  12.Status post left total knee replacement.  13.Left lower extremity thrombophlebitis in the past.  14.History of osteoarthritis.   DISCHARGE MEDICATIONS:  1. Lantus 28 units subcu daily.  2. Glucovance 2.5/500 mg p.o. b.i.d.  3. Levothyroxine 170 mcg p.o. daily.  4. Simvastatin 40 mg p.o. daily.  5. Verapamil 120 mg p.o. daily.  6. Relafen 750 mg p.o. b.i.d.  7. Dilaudid 4 mg p.o. q.4 hours p.r.n.  8. Lovenox 150 mg subcu q.12 hours.  9. Valium 5 mg p.o. b.i.d.  10.Iron sulfate 325 mg p.o. daily.  11.Calcium carbonate 500 mg p.o. b.i.d.  12.Doxycycline 100 mg p.o. b.i.d. x5 days.  13.Keflex 500 mg p.o. q.i.d. x5 days.   CONSULTANTS:  1. Dr. Megan Salon, infectious diseases.  2. Wound care.  3. Dr. Sharol Given, orthopedic surgery.   PROCEDURES:  Application of Unna boot to right lower extremity.   DIAGNOSTIC STUDIES:  Two-view chest x-ray done on admission which shows  stable cardiomegaly.  No acute cardiopulmonary abnormality.   CODE STATUS:  Full code.   ALLERGIES:  No known drug allergies.   PRIMARY CARE PHYSICIAN:   Bonne Dolores, M.D./[facility].   CHIEF COMPLAINT:  Increased redness, swelling, and pain in the right  lower extremity.   HISTORY OF PRESENT ILLNESS:  Please refer to the H and P by Dr. Jana Hakim for details of the HPI.   HOSPITAL COURSE:  1. This is a patient who was admitted with clear clinical signs of      cellulitis of the right lower extremity.  The patient was started      on vancomycin and Ancef.  Therapy was tapered down to vancomycin      and the patient improved clinically with resolution of fevers,      decrease local signs of swelling, erythema and warmth.  A wound      care consult was obtained and recommendations were placed for an      Unna boot to be applied.  The patient currently has the Unna boot      applied to his lower extremity. The Unna boot is to be changed      weekly on Sundays per the recommendations of Wound  and Ostomy Care.      The patient will be discharged on doxycycline 100 mg p.o. b.i.d. x5      days and Keflex 500 mg p.o. q.6 hours x5 days.  2. Chronic back pain.  The patient had been seen by Dr. Sharol Given in the      past for chronic back pain and had been receiving steroid      injections as an outpatient.  The patient requested to see Dr. Sharol Given      regarding this as he could not be taken from the hospital over to      [facility] for his injection.  At the patient's request,      Dr. Sharol Given was consulted, specifically for the back pain.  The      patient can be seen by Dr. Sharol Given as an outpatient and      recommendations made for continuing his steroid injections for his      back pain.  3. Diabetes type 2.  The patient has well-controlled blood sugars      while here in the hospital.  He has been compliant with his diet.  4. History of DVT and pulmonary embolus.  The patient is maintained on      his subcu Lovenox on a q.12 hour basis.  5. History of hypertension.  The patient was continued on verapamil;      however, he  was at a decreased down to 120 mg daily and his blood      pressures remained in normotensive range.  6. Chronic pain.  The patient is resumed on his Dilaudid by mouth as      needed.  7. For his hypothyroidism, he was continued on his levothyroxine 175      mcg p.o. daily.  8. For his hyperlipidemia, he was continued on his simvastatin 40 mg      p.o. daily.   Otherwise the patient has been stable.   DIETARY RESTRICTIONS:  The patient should be on a 2 grams sodium, low  cholesterol, diabetic diet.   PHYSICAL RESTRICTIONS:  Activity as tolerated.  The patient usually  ambulates with a walker.   CONDITION ON DISCHARGE:  Clinically stable.   DISPOSITION:  [facility]   PERTINENT LABORATORY STUDIES:  ABIs were performed on this patient which  showed left pedal Doppler waveforms normal and vessels incompressible  with  left great toe pressure supports healing from a vascular  standpoint.  On the right the Doppler waveforms were normal and they are  unable to obtain a pressure due to the Unna boot, however the right  great toe pressure supports healing from a vascular standpoint.   Total time for dictation 50 minutes.      Leana Gamer, MD  Electronically Signed     MAM/MEDQ  D:  10/18/2008  T:  10/18/2008  Job:  [id number]   cc:   Bonne Dolores, M.D.  [place]

## 2010-07-12 NOTE — Discharge Summary (Signed)
[name], [name] NO.:  [id number]   MEDICAL RECORD NO.:  [id number]          PATIENT TYPE:  INP   LOCATION:  5001                         FACILITY:  [facility]   PHYSICIAN:  Newt Minion, MD     DATE OF BIRTH:  01/04/1956   DATE OF ADMISSION:  01/15/2008  DATE OF DISCHARGE:  01/20/2008                               DISCHARGE SUMMARY   FINAL DIAGNOSIS:  Osteoarthritis, left knee.   PROCEDURE:  Left total knee arthroplasty.   Discharged to skilled nursing in stable condition.  Physical therapy,  progressive ambulation, weightbearing as tolerated on the left.   FOLLOWUP:  Dr. Sharol Given in 2 weeks.   DISCHARGE MEDICATIONS:  1. Coumadin 6 mg p.o. daily, monitor INR to maintain INR between 2 and      3.  2. Colace 100 mg p.o. b.i.d. p.r.n.  3. Iron 324 mg p.o. b.i.d. for 1 week.  4. Sliding scale insulin.  5. NovoLog CBG 101-150, 3 units; CBG 151-200, 4 units; CBG 201-250, 7      units; CBG 251-300, 9 units; CBG 301-350, 12 units; CBG 351-400, 15      units; CBG t.i.d. with meals.  6. Levothyroxine 150 mcg p.o. at bedtime.  7. Glyburide 2.5 mg p.o. b.i.d.  8. Metformin 500 mg p.o. b.i.d.  9. Verapamil 240 mg p.o. daily.  10.Lotensin 20 mg p.o. daily.  11.Hydrochlorothiazide 12.5 mg p.o. daily.  12.Zocor 40 mg p.o. daily.  13.Lantus insulin 35 units subcu at bedtime.  14.Percocet 1-2 p.o. q.4 h. p.r.n. for pain.  15.Vicodin 1-2 p.o. q.4 h. p.r.n. pain.  16.Robaxin 500 mg p.o. q.6 h. p.r.n. spasm.  17.Restoril 15 mg p.o. at bedtime p.r.n. sleep.   HISTORY OF PRESENT ILLNESS:  The patient is a 55 year old gentleman with  multiple medical problems including diabetes.  He also uses CPAP machine  at night.  He presents with worsening arthritis of his left knee.  He is  unable to ambulate to perform activities of daily living and presents at  this time for total knee arthroplasty.  The patient underwent a left  total knee arthroplasty on January 15, 2008.  He  received DePuy  components; a #5 femur, #4 tibia, 10-mm poly tray with a 35-mm patella.  He received a femoral block and general anesthetic.  He received Kefzol  for infection prophylaxis and was started on Coumadin for DVT  prophylaxis.  Tourniquet time 39 minutes at 350 mmHg.  Postoperatively,  the patient progressed extremely slowly with the physical therapy.  The  patient's hemoglobin dropped to 8.1 on January 16, 2008, acute blood  loss anemia.  He was typed and crossed and transfused with 2 units of  packed red blood cells.  His Lantus insulin was increased to 35 units  due to elevated glucose.  Due to the patient's slow progress with  therapy, he was set up for discharge to short-term skilled nursing.  The patient states that he did not feel comfortable going home, and he  states his wife would not be able to care for him due to her current  work schedule.  The patient was discharged to skilled nursing in stable  condition.  Followup with Dr. Sharol Given in 2 weeks.      Newt Minion, MD  Electronically Signed     MVD/MEDQ  D:  01/20/2008  T:  01/20/2008  Job:  [id number]

## 2010-07-12 NOTE — Discharge Summary (Signed)
[name], SABORI NO.:  [id number]   MEDICAL RECORD NO.:  [id number]          PATIENT TYPE:  INP   LOCATION:  [id number]                         [facility]ACILITY:  [facility]   PHYSICIAN:  Neysa Bonito, MD  DATE OF BIRTH:  11/02/1955   DATE OF ADMISSION:  09/16/2008  DATE OF DISCHARGE:                               DISCHARGE SUMMARY   PRIMARY CARE PHYSICIAN:  Dr. Bonne Dolores.   CHIEF COMPLAINT:  Intractable back pain.   HISTORY OF PRESENT ILLNESS:  A 55 year old Caucasian with multiple  medical problems and medical complaints presented with the above  complaint.   HOSPITAL COURSE:  During hospital stay, the patient was evaluated by  Interventional Radiology for [facility] (epidural steroid injection), which is  done across the street in the Radiology Suite.  I have yet to see the  report of the injection, but nevertheless the patient has reported  improvement in his back pain.   The patient felt he is deconditioned and he had left lower extremity  swelling that hinders his mobility.  For that reason, he was evaluated  by physical therapy with the recommendation of the skilled nursing  facility placement.  The patient will be discharged to skilled nursing  facility for rehab purposes.   The patient has history of DVT and PE.  He was taking Coumadin for the  anticoagulation.  The patient has a plan with Dr. Humphrey Rolls with Oncology for  lymph node biopsy, and for that purpose I stopped the Coumadin and  started him on Lovenox.  He will be discharged on Lovenox.   Hypertension remained stable during hospital stay.   Diabetes remained stable during hospital stay.   Sleep apnea.  The patient was continued on CPAP during hospital stay.  The rest of his medical problems remained unchanged during his stay.   DISCHARGE DIAGNOSES:  1. Intractable back pain status post epidural steroid injection.  2. Hypertension.  3. Diabetes.  4. Hyperlipidemia.  5. Hypothyroidism.  6.  Osteoarthritis.  7. Sleep apnea.  8. Anemia.  9. History of deep venous thrombosis and pulmonary embolisms, for      which he is on chronic Coumadin.  10.Chronic left lower extremity thrombophlebitis.  11.History of left distal lower extremity cellulitis.  12.Lymphadenopathy per history.   DISCHARGE MEDICATIONS:  1. Ferrous sulfate 325 mg p.o. t.i.d.  2. Hydromorphone 4 mg p.o. q.4 h p.r.n.  3. Benazepril/hydrochlorothiazide 20/12.5 mg p.o. daily.  4. Diazepam 10 mg p.o. at bedtime.  5. Synthroid 175 mcg p.o. daily.  6. Nabumetone 750 mg p.o. b.i.d.  7. Zocor 40 mg at night.  8. Verapamil 240 mg p.o. at night.  9. Lovenox therapeutic dose which is 150 mg p.o. q.12 h.  10.Glyburide/metformin 2.5/500 mg p.o. twice a day.  11.NovoLog sliding scale.  12.Lantus 28 units subcutaneous at bedtime.   IMAGING AND PROCEDURES:  1. X-ray lumbar spine on July 21:  Impression is no acute finding,      scoliosis lower lumbar facet arthropathy, extensive bridging of T5,      probably representing diffuse idiopathic skeletal hyperostosis.  2.  The patient has epidurography on September 18, 2008.  Impression is      satisfactory intraluminal lumbar epidural steroid injection.  Left      L4 and L5.   DISCHARGE/PLAN:  As discussed above.  The patient will be discharged to  skilled nursing facility.  Was advised to follow up with Dr. Humphrey Rolls as  scheduled for the biopsy.  He is aware and agreeable to discharge.      Neysa Bonito, MD  Electronically Signed     EME/MEDQ  D:  09/21/2008  T:  09/21/2008  Job:  [id number]

## 2010-07-12 NOTE — Group Therapy Note (Signed)
[name], [name]               ACCOUNT NO.:  [id number]   MEDICAL RECORD NO.:  [id number]          PATIENT TYPE:  INP   LOCATION:  A330                          FACILITY:  [facility]   PHYSICIAN:  Bonnielee Haff, MD     DATE OF BIRTH:  October 05, 1955   DATE OF PROCEDURE:  03/17/2008  DATE OF DISCHARGE:                                 PROGRESS NOTE   SUBJECTIVE:  The patient is feeling better.  His shortness of breath is  improved.  Cough is better though he is still coughing up yellowish  sputum mixed with blood.   OBJECTIVE FINDINGS:  VITAL SIGNS:  Temperature 97.6, heart rate 82,  respiratory 20, blood pressure 128/74, saturation 96% on 3 liters, 89%  on room air.  GENERAL EXAM:  This is an obese white male in no distress.  HEENT:  There is no pallor, no icterus.  Oral mucous membranes are  moist.  No oral lesions are noted.  NECK:  Soft and supple.  No thyromegaly is appreciated.  LUNGS:  Clear to auscultation.  No wheezing is present.  A few crackles  at bilateral bases.  CARDIOVASCULAR SYSTEM:  Rate is normal, regular.  No murmurs  appreciated.  No S3, S4, no rubs, no bruits.  ABDOMEN:  Is soft, nontender, nondistended, obese.  LOWER EXTREMITIES:  Show pitting edema bilaterally.  NEUROLOGIC:  He is alert, oriented x3.  No focal neurological deficits  are present.   LABORATORIES:  White count is normal this morning.  Hemoglobin was 8.3.  He is getting q. 6 H and H and the last value is 10.0, platelet count  247, INR is down to 3.2, glucose 106.  Renal function is normal.  Cardiac markers are negative.  BNP was 82.  TSH is 6.6.  Free T4 is  0.72.  Iron profile study shows no evidence for iron-deficiency or B12  deficiency.   ASSESSMENT/PLAN:  1. Shortness of breath.  Considering normal BNP, this was most likely      a pneumonia even though his white count was normal.  The patient      did have a fever at home.  He will be continued on Levaquin.  We      will repeat his chest  x-ray to see if there are any significant      changes.  His echocardiogram was done and he seems to have a normal      ejection fraction.  No evidence for pulmonary hypertension was      noted on this echocardiogram.  The patient is symptomatically      improved.  2. Lower extremity edema.  He is on intravenous Lasix once daily,      which will be continued at this time.  Electrolytes will be checked      on a daily basis.  3. History of deep vein thromboses.  He is on Coumadin.  His INR was      supratherapeutic, greater than 9 yesterday.  Reason for that is not      very clear.  He was given vitamin K  and his INR has come down this      morning.  Coumadin will be restarted to maintain INR between 2 and      3.  4. Type 2 diabetes.  His home medications are being continued.  5. Hypothyroidism.  He does have a low free T4 and an elevated TSH.      His Synthroid dose can be increased and we will go ahead and do      that at this time.  6. History of hypertension, stable.  7. History of anemia, stable.  There is no evidence for any active      bleeding.  However, this patient does have a      family history of colon cancer.  He is 55 years old and likely      needs a colonoscopy in the near future.  I have already discussed      this with him in great detail and he will talk about this issue      with his primary medical doctor and arrange a colonoscopy.      Bonnielee Haff, MD  Electronically Signed     GK/MEDQ  D:  03/17/2008  T:  03/17/2008  Job:  [id number]

## 2010-07-12 NOTE — Consult Note (Signed)
NAMEJAMAS, SEIGLE NO.:  [id number]   MEDICAL RECORD NO.:  [id number]          PATIENT TYPE:  INP   LOCATION:  5031                         FACILITY:  [facility]   PHYSICIAN:  Newt Minion, MD     DATE OF BIRTH:  June 20, 1955   DATE OF CONSULTATION:  10/15/2008  DATE OF DISCHARGE:                                 CONSULTATION   HISTORY OF PRESENT ILLNESS:  The patient is a 55 year old gentleman well-  known to me who resides at the [facility].  The patient was  brought to the hospital on October 10, 2008 for cellulitis, infection,  redness, and drainage from the right leg.  The patient by report had  temperature up to 102.1.  The patient is seen for evaluation of his  bilateral lower extremities.  The patient at this time denies any  nausea, vomiting or chills.   PAST MEDICAL HISTORY:  Significant for type 2 diabetes, hypertension,  hyperlipidemia, hypothyroidism, osteoarthritis, chronic back pain,  morbid obesity, COPD, history of DVT and pulmonary embolus on a chronic  blood thinning therapy.   MEDICATIONS:  Lantus, NovoLog, verapamil, levofloxacin, simvastatin,  Dilaudid, Lovenox, Glucovance, iron, hydrochlorothiazide, diazepam,  Relafen.   ALLERGIES:  No known drug allergies.   OBJECTIVE:  GENERAL:  The patient is alert and oriented.  There was no  adenopathy.  He has a massive venous stasis swelling on both lower  extremities with brawny skin color changes.  There is a cellulitis  involving the entire right leg with weeping drainage from the right leg  secondary to cellulitis.   ASSESSMENT:  Weeping edema cellulitis right leg with venous stasis  insufficiency.   PLAN:  Continue IV antibiotics.  We will have wound care nursing apply  probe for wraps to both legs, change these every other day.  The patient  states that he was scheduled for an epidural steroid at [facility]  imaging.  I discussed the importance of not pursuing the epidural  steroid until the leg ulcer infection has resolved.  I told him it will  be inappropriate to pursue the epidural steroid at this time.  The  patient was very focused and concerned and that he wanted his epidural  steroids.  I will follow up with them as needed during his hospital  stay.      Newt Minion, MD  Electronically Signed     MVD/MEDQ  D:  10/15/2008  T:  10/16/2008  Job:  [id number]

## 2010-07-12 NOTE — H&P (Signed)
[name], [name] NO.:  [id number]   MEDICAL RECORD NO.:  [id number]          PATIENT TYPE:  EMS   LOCATION:  MAJO                         FACILITY:  [facility]   PHYSICIAN:  Cherene Altes, M.D.DATE OF BIRTH:  1955/11/16   DATE OF ADMISSION:  11/15/2007  DATE OF DISCHARGE:                              HISTORY & PHYSICAL   PRIMARY CARE PHYSICIAN:  Unassigned.   CHIEF COMPLAINT:  Dyspnea with fever and occasional hemoptysis.   PRESENT ILLNESS:  Mr. [name] is a very pleasant 55 year old gentleman  with a known history of pulmonary embolism and DVT.  He was in his usual  state of health until approximately 5-6 days ago.  He then began to  notice fever initially.  After about 24 hours this became associated  with shortness of breath.  After about 40 hours more it began to be  accompanied by cough productive of thick sputum.  After the initial  onset of his symptoms he presented to his primary care physician's  office.  He was dosed with a Z-Pak.  He presented 3-4 days later when  his symptoms persisted and apparently was given a second Z-Pak.  Tonight, when his symptoms failed to improve further, he was referred to  the emergency room for evaluation.  After evaluation in the emergency  room the patient was found to have a very dense right lung infiltrate,  and was found to be significantly hypoxic with O2 sat as low as 78 on  room air.  The patient has been dosed with Rocephin and azithromycin IV  in the ER empirically.  At the time of my evaluation he is exhibiting  moderate respiratory distress but is able to carry on a conversation and  provide his own history.  He denies chest pain, nausea, or vomiting.  He  has had one or two episodes of watery diarrhea which have followed the  use of the azithromycin.  On occasion when he coughs he has brought up  bright red blood but only in minimal amounts per his report.   REVIEW OF SYSTEMS:  A comprehensive review of  systems are unremarkable  with the exception of multiple positive elements noted in his present  illness above.   PAST MEDICAL HISTORY:  1. Diabetes mellitus type 2.  2. Sleep apnea on q.h.s. BiPAP.  3. Hypothyroidism.  4. History B12 deficiency.  5. 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  6. Hyperhomocysteinemia.  7. Hypertension.  8. Morbid obesity.  9. Bilateral total hip replacements.  10.History of chronic left lower extremity thrombophlebitis with      multiple episodes of cellulitis secondary to history of DVT.   OUTPATIENT MEDICATIONS:  Doses unclear.  Synthroid, verapamil, Coumadin,  cholesterol medicine, benazepril/hydrochlorothiazide,  glyburide/metformin, Lantus 30 units subcu q.h.s., NovoLog insulin.   ALLERGIES:  NO KNOWN DRUG ALLERGIES.   FAMILY HISTORY:  Noncontributory but reviewed per old records.   SOCIAL HISTORY:  The patient is married, he lives in [place].  He  does not smoke, he does  not drink.   DATA REVIEW:  Hemoglobin is low at 11.5 with an MCV of 84, platelet  count and white count normal.   Chest x-ray reveals dense infiltrate of the right lung.   BMET is unremarkable with the exception of elevated BUN at 29 and  elevated serum glucose at 332 with a normal creatinine.  ABG reveals pH  of 7.39, pO2 of 51 and pCO2 of 48.  BNP is 64, INR is 1.8 with PTT 43.   CT angiogram reveals no evidence of PE but does reinforce the presence  of severe infiltrate.   PHYSICAL EXAMINATION:  VITAL SIGNS:  Temperature 97.6.  Blood pressure  130/72, heart rate 95, respiratory 26, O2 sats 86% on room air with  dipping as low as 79% on room air.  GENERAL:  Morbidly obese gentleman in moderate respiratory distress with  no acute ST but able to carry out a conversation in complete, full  sentences.  HEENT: Normocephalic, atraumatic.  Pupils equal, round, reactive to  light and accommodation.   Extraocular muscles intact bilaterally.  OC/OP  clear.  NECK:  No JVD.  No lymphadenopathy.  LUNGS:  Diffuse crackles throughout all areas of the right lung and in  the left basilar fields with no expiratory wheeze but with poor air  movement throughout.  CARDIOVASCULAR:  Distant but regular without gallop or rub with normal  S1and S2.  ABDOMEN:  Markedly obese, protuberant, round, no organomegaly is  appreciable.  No rebound, no ascites.  EXTREMITIES:  2+ bilateral lower extremity edema without cyanosis or  clubbing.  NEUROLOGIC: Alert and oriented x4.  Cranial nerves II-XII intact  bilaterally, 5/5 strength bilateral upper and lower extremities intact  sensation to touch throughout.   IMPRESSION AND PLAN:  1. Severe right lung community-acquired pneumonia - the patient is      suffering with a tri lobar community-acquired pneumonia.  In the      setting of a patient with known sleep apnea requiring continuous      positive airway pressure/obesity hypoventilation syndrome this is      quite concerning.  The patient is in moderate respiratory distress      at the present time.  I will continue empiric Rocephin and      azithromycin to cover both atypical and typical community-acquired      pathogens.  I will place the patient on step-down because of what      appears to be a possibility of rapid decline.  We will continue      q.h.s. continuous positive airway pressure as per his home regimen.      Nebulizer therapy will be administered to attempt to maximize air      movement.  Sputum culture will be sent.  2. Diabetes mellitus - we will continue sliding scale insulin.  Will      dose the patient with Lantus insulin as per his home regimen.  We      will follow his CBGs closely.  3. Hypothyroidism - will place the patient on low-dose Synthroid for      now.  We will attempt to confirm his home Synthroid dose and change      to this at such time this information is available.  4.  History of B12 deficiency - the patient is anemic at the present      time.  It is not a macrocytic anemia, but there is certainly a      possibility of  a mixed picture.  I will therefore check a 123456,      folic acid level, and anemia panel.  5. Hypertension - we will hold benazepril at the present time due to      my concern that the patient could decline, and should he develop      hypotension or sepsis, I do not wish for him to have an angiotensin-      converting enzyme inhibitor on board.  This certainly is, however,      an important medication for this patient and I wish to resume it as      soon as he proves to be stable.  6. History pulmonary embolism and deep venous thrombosis - we will      continue the patient's Coumadin.  Will      cover him on full-dose Lovenox until such time that his Coumadin      becomes therapeutic.  7. Dehydration - the patient is clinically dehydrated.  We will gently      hydrate him using isotonic saline and follow his BUN to creatinine      ratio closely.      Cherene Altes, M.D.  Electronically Signed     JTM/MEDQ  D:  11/15/2007  T:  11/16/2007  Job:  [id number]

## 2010-07-12 NOTE — Discharge Summary (Signed)
[name], [name]               ACCOUNT NO.:  [id number]   MEDICAL RECORD NO.:  [id number]          PATIENT TYPE:  INP   LOCATION:  A330                          FACILITY:  [facility]   PHYSICIAN:  Orpah Melter, MD       DATE OF BIRTH:  [birth year]   DATE OF ADMISSION:  03/16/2008  DATE OF DISCHARGE:  LH                               DISCHARGE SUMMARY   PRIMARY CARE PHYSICIAN:  Bonne Dolores, MD   PRIMARY DISCHARGE DIAGNOSIS:  Supratherapeutic INR secondary to Coumadin  overdose.   SECONDARY DIAGNOSES:  1. Dyspnea.  2. Chronic lower extremity edema.  3. Microcytic anemia.  4. Obesity.  5. Pulmonary edema.  6. Hypothyroid.  7. Hypercholesterolemia.  8. Obstructive sleep apnea.  9. Diabetes mellitus.   HOSPITAL COURSE BY PROBLEM:  1. Dyspnea with pulmonary edema.  The patient is a 55 year old male      who was recently admitted secondary to feelings of subjective      dyspnea.  He was found incidentally to have an INR greater than      9.8.  The patient had had a knee replacement and he also has had      some groin pain secondary to an enlarged lymph node.  The patient      did receive oxycodone from his orthopedic surgeon, but this was not      powerful enough, he presented to the emergency department at [facility].  There, he was given Dilaudid 4 mg p.o. q.4 h.      The patient did take the medication and admitted to taking it      perhaps more often that I should have.  The patient did present      to the emergency department with acute shortness of breath.  The      patient's symptoms have now resolved.  He had no evidence of      infectious etiology.  His chest x-ray showed pulmonary edema that      has now cleared.  He is now being discharged to home with      instructions  to take his Dilaudid every 12 hours.  The patient      will follow up with his primary care physician if needed for      further evaluation and treatment.  2. Hypocoagulable state  secondary to Coumadin overdose.  The patient      did present with an INR greater than 9.8.  He has been on Coumadin      chronically for pulmonary emboli as well as deep venous thrombosis.      He is going to get a biopsy of his lymph node on Monday.  The      patient's INR has come down significantly after treatment with      vitamin K.  The patient is now being discharged with subcu Lovenox      that he should take for the next 5 days.  He is instructed on      Sunday the day before his  biopsy, which is March 22, 2008, to      only take the morning dose of his b.i.d. Lovenox, then to report      for his biopsy and after the biopsy to check with the physician      doing the biopsy to see if he can resume his Lovenox treatment.  On      March 23, 2008, he is suppose to call his primary care physician      and ask about returning to Coumadin.  The patient understands these      instructions, and they were written out for him as well.  The      patient does take insulin and is confident and capable of giving      himself the injection of Lovenox with no untoward side effects.  3. Microcytic anemia.  The patient did have iron study showing he had      adequate iron stores for erythropoiesis.  He does take iron      supplements as an outpatient, these will be continued.  4. Obesity.  This is stable.  5. Hypothyroid.  The patient was found to be subtherapeutic on his      current levothyroxine dosage and his dosage has been increased to      175 mcg p.o. daily.  6. Hypercholesterolemia.  The patient will continue his statin      therapy.  7. Obstructive sleep apnea.  The patient will continue CPAP at night.  8. Diabetes mellitus.  The patient has been relatively well controlled      on his current regimen and he will continue this post discharge      with no changes.  He did have a hemoglobin A1c of 7.2.   LABORATORY DATA OF NOTE:  1. Anemia with a hemoglobin of 8.7, hematocrit 27.2.   This may in part      be due to his recent hypocoagulable state from his Coumadin      overdose.  2. INR of initially greater than 9.8, decreasing to 1.4 on day prior      to discharge.  3. Glucose ranging between 83 and 151.  4. Negative cardiac enzymes x3.  5. TSH of 6.66 with a free T4 of 0.72.  6. Ferritin of 203 with a serum INR of 49 indicating adequate iron      stores for erythropoiesis.  7. UA showing specific gravity of 1.025.  Otherwise, no evidence of      infection or abnormality.   STUDIES:  1. Portable chest, on admission showing bilateral airspace disease      consistent with edema.  2. Subsequent film.  Patchy bilateral airspace disease substantially      improved.  No evidence of effusion.  He does have cardiomegaly.   MEDICATIONS ON DISCHARGE:  1. Synthroid 175 mcg increased from 150 mcg p.o. daily.  2. Iron sulfate 325 mg p.o. t.i.d.  3. Benazepril/hydrochlorothiazide 20/12.5 one p.o. daily.  4. Simvastatin 40 mg p.o. at bedtime.  5. Oxycodone/acetaminophen, which has been discontinued.  6. Verapamil 240 mg p.o. daily.  7. Coumadin, which has been discontinued until after biopsy.  8. Dilaudid 4 mg p.o. b.i.d.  9. Robaxin 750 mg p.o. daily.   There are labs pending for a.m. prior to discharge.  Otherwise, there  are no labs or studies pending at the time of discharge.  The patient is  in stable condition.   TIME SPENT:  35 minutes.  Orpah Melter, MD  Electronically Signed     JF/MEDQ  D:  03/18/2008  T:  03/19/2008  Job:  [id number]   cc:   Bonne Dolores, M.D.  Fax: [phone]

## 2010-07-12 NOTE — Consult Note (Signed)
  NAMEMICHAH, SCHOU               ACCOUNT NO.:  [id number]  MEDICAL RECORD NO.:  [id number]           PATIENT TYPE:  I  LOCATION:  [id number]                         FACILITY:  [facility]  PHYSICIAN:  Yevonne Yokum I. Gaynelle Arabian, M.D.DATE OF BIRTH:  05-Feb-1956  DATE OF CONSULTATION: DATE OF DISCHARGE:                                CONSULTATION   SUBJECTIVE:  The patient is a 55 year old obese male, 1 month status post total left knee arthroplasty (Dr. Sharol Given), transferred to [facility] for rehabilitation.  However, the patient developed a right flank pain, nausea, vomiting, was sent to [facility] Emergency Room where a CT scan showed multiple bilateral renal stones, with an 11 mm right and 9 mm left renal stone and an 8 mm right ureteropelvic junction stone with proximal obstruction and pararenal stranding.  Also note, that the patient has multiple lymph nodes identified with a question of lymphoproliferative disorder and cholelithiasis without cholecystitis.  PAST MEDICAL HISTORY: 1. Diabetes type 2. 2. Morbid obesity. 3. Septic arthritis in the left knee, currently on vancomycin and     Cipro. 4. BPH, treated with Flomax. 5. Hyperlipidemia. 6. Hypothyroidism. 7. Anemia (question mark association with lymphoproliferative     disorder). 8. Hypertension.  TOBACCO/ALCOHOL:  None.  SOCIAL HISTORY:  The patient lives with his wife and father-in-law.  MEDICATIONS: 1. Lantus insulin. 2. Levothyroxine 200 mcg per day. 3. Simvastatin. 4. Glyburide 5 mg b.i.d. 5. Metformin 500 mg b.i.d. 6. Lasix 40 mg per day. 7. Iron 325 mg t.i.d. 8. Benazepril 20 mg per day. 9. Verapamil 180 mg per day. 10.Vitamin E 400 units per day. 11.Coumadin 5 mg p.o. per day.  REVIEW OF SYSTEMS:  Shows all systems reviewed and are negative except for systems that are noted in the history of present illness.  PHYSICAL EXAMINATION:  GENERAL:  Shows a morbidly obese male in no acute distress. VITAL  SIGNS:  Show blood pressure 128/78, pulse 86, respiratory rate 20, weight 369.6 pounds.  O2 saturation 97%. NECK:  Supple, nontender.  No nodes. ABDOMEN:  Morbidly obese.  Distant bowel sounds.  There is no abdominal pain to CVA percussion.  There is only mild pain to palpation in the right lower quadrant. CHEST:  Clear to percussion and auscultation. EXTREMITIES:  Show bilateral lymph edema.  LABORATORY DATA:  CT scan reviewed in HPI.  ASSESSMENT: 1. Morbidly obese. 2. Insulin-dependent diabetic with obstructing right ureteropelvic     junction stone (8 mm) and bilateral renal calculi. The patient is on Coumadin and IV antibiotics for septic arthritis of the left knee arthroplasty.  He will be admitted to medical service for evaluation and treatment and will notify [facility]t of the patient's admission.  He will remain n.p.o. for possible cystoscopy and stent placement.     Nakeya Adinolfi I. Gaynelle Arabian, M.D.     SIT/MEDQ  D:  07/04/2010  T:  07/04/2010  Job:  [id number]  cc:   Lillette Boxer. Dahlstedt, M.D. [phone]  Electronically Signed by Carolan Clines M.D. on 07/12/2010 05:10:37 PM

## 2010-07-12 NOTE — Consult Note (Signed)
NAMEDEWARREN, ALAMOS               ACCOUNT NO.:  [id number]   MEDICAL RECORD NO.:  [id number]          PATIENT TYPE:  REC   LOCATION:  FOOT                         FACILITY:  [facility]   PHYSICIAN:  Ermalene Searing. Philip Aspen, M.D.DATE OF BIRTH:  1955/07/27   DATE OF CONSULTATION:  DATE OF DISCHARGE:                                 CONSULTATION   REQUESTING PHYSICIAN:  Ezequiel Kayser, MD   REASON FOR CONSULTATION:  Right leg ulcers.   HISTORY OF PRESENT ILLNESS:  The patient is a 55 year old white male who  has been a resident of the [facility] since September 21, 2008.  He was recently admitted to the  hospital on October 10, 2008, until October 18, 2008, because of  cellulitis of the right lower extremity.  He has venous stasis ulcers on  the right that have been treated with Unna boots.  This has not been  associated with significant improvement in his overall condition.  He is  currently most troubled by chronic low back pain, it radiates down the  left lower extremity.  He was recently seen by an orthopedist, who did  not recommend high-dose Depo-Medrol, epidural steroid injections because  of his recent infection in the right leg.   PAST MEDICAL HISTORY:  1. Diabetes mellitus, type 2.  2. Hypertension, essential, controlled.  3. Obstructive sleep apnea for which he is on CPAP.  4. Morbid obesity.  5. Hyperlipidemia.  6. DVT in the left lower extremity that is old.  7. Chronic venous insufficiency.  8. Anemia.  9. Hypothyroidism.   MEDICATIONS AT THE TIME OF EVALUATION:  1. Dilaudid 4 mg every 4 hours p.r.n. pain.  2. Lovenox 150 mg subcutaneous every 12 hours.  3. Valium 5 mg p.o. twice daily.  4. Iron sulfate 325 mg daily.  5. Calcium carbonate 500 mg twice daily.  6. Doxycycline 100 mg twice daily for 5 days which he just completed.  7. Keflex 500 mg p.o. 4 times daily for 5 days it was just completed.  8. Synthroid 200 mcg p.o.  daily.  9. Lantus insulin 28 units subcutaneous daily.  10.Glucovance 2.5/500 one tablet p.o. twice daily.  11.Zocor 40 mg daily.  12.Verapamil 120 mg daily.  13.Relafen 750 mg twice daily.   ALLERGIES:  No known drug allergies.   PAST SURGICAL HISTORY:  1. In 2004, he had right total hip replacement.  2. In 2004, he had left total hip replacement.  3. In 2009, he had left total knee replacement.   FAMILY HISTORY:  This is notable for diabetes mellitus, type 2 in his  mother, colon cancer in his father, and premature cardiovascular disease  in his father and mother.  There is no family history of breast cancer.   SOCIAL HISTORY:  He is married and does not have children.  He has no  history of tobacco or alcohol abuse.  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   REVIEW OF SYSTEMS:  He has not had recent fever or chills.  He  denies  shortness of breath or cough or chest pain.  He denies abdominal pain,  constipation, or diarrhea.  He has chronic left lower extremity pain and  low back pain.  He generally walks short distances with a walker.   INITIAL PHYSICAL EXAMINATION:  VITAL SIGNS:  Blood pressure 117/80,  pulse 92, respirations 19, temperature 98, capillary blood glucose level  was 130.  GENERAL:  He is a morbidly obese white male who is in no apparent  distress while sitting in a wheelchair.  He had somewhat pressured  speech.  HEAD, EYES, EARS, NOSE, AND THROAT:  Within normal limits.  NECK:  Supple without jugular venous distention or carotid bruit.  CHEST:  Clear to auscultation.  HEART:  Regular rate and rhythm.  ABDOMEN:  Benign.  EXTREMITIES:  He had bilateral 2+ leg edema (left greater than right)  with hyperpigmentation of the distal legs consistent with chronic venous  insufficiency.  On the left lower extremity, he had a very superficial  ulcer measuring 1.4 cm x 0.5 cm x 0.1 cm.  This ulcer did not have  significant eschar or  necrotic debris and had a red wound base with red  granulation tissue.  There was no exposed bone, tendon, or muscle and no  foul odor.  There was scant amount of serous drainage and an intact  periwound integrity.   Wound #2:  This is in the area on the anterior aspect of the right mid  leg.  This area of superficial ulceration does not have significant  eschar.  There was moderate necrotic debris with a red and yellow wound  base and no significant granulation tissue.  There was no exposed bone,  tendon, or muscle and no foul odor.  There was a moderate amount of  drainage.  It was serous with an intact periwound integrity.  The right  leg has mild diffuse erythema but no purulence.   In addition, there was approximately 0.5 cm x 0.5 cm area of dry  gangrene on the tip of the right great toe.   IMPRESSION:  Bilateral lower extremity venous stasis ulcers (right  greater than left).  In the left lower extremity, he has had significant  pain in the past with Unna boot treatment and there were no signs of  infection and only minimal oozing from the ulcers.   PLAN:  After 5% lidocaine gel was applied to all ulcers, a #10 scalpel  was used to debride the necrotic debris from the ulcers.  Following  this, Santyl was applied, covered by Acticoat, covered by Lauraine Rinne,  covered by an Unna boot on the right.  He will have the Unna boots  changed every Tuesday and Friday at the skilled nursing facility and  will have a followup visit in approximately 2 weeks.           ______________________________  Ermalene Searing Philip Aspen, M.D.     DGP/MEDQ  D:  10/27/2008  T:  10/28/2008  Job:  [id number]

## 2010-07-12 NOTE — H&P (Signed)
[name], [name] NO.:  [id number]   MEDICAL RECORD NO.:  [id number]          PATIENT TYPE:  INP   LOCATION:  5031                         FACILITY:  [facility]   PHYSICIAN:  [name], M.D. DATE OF BIRTH:  February 24, 1956   DATE OF ADMISSION:  10/10/2008  DATE OF DISCHARGE:                              HISTORY & PHYSICAL   CHIEF COMPLAINT:  Increased redness, swelling and pain of the right  lower leg.   HISTORY OF PRESENT ILLNESS:  This is a 55 year old male who is currently  residing at the [facility] facility who was brought to the  emergency department secondary to complaints of increased pain, redness  and swelling of his right lower leg which has been worsening over the  past 24 hours.  The patient reports beginning to have fevers and was  found to have a temperature of 102.1 in the emergency department on  arrival.  The patient denies having any nausea, vomiting, diarrhea,  chest pain or shortness of breath.  The patient was recently hospitalized July 21 until September 21, 2008,  secondary to intractable lumbar pain.   PAST MEDICAL HISTORY:  Significant for type 2 diabetes mellitus,  hypertension, hyperlipidemia, hypothyroidism, osteoarthritis, chronic  back pain, morbid obesity, obstructive sleep apnea, history of DVT and  pulmonary emboli on chronic Coumadin therapy, hyperhomocysteinemia, left  lower extremity thrombophlebitis, chronic venous insufficiency of the  bilateral lower extremities, lymphadenopathy status post lymph node  biopsies in the past, nephrolithiasis, status post bilateral hip  replacements, status post left total knee replacement, and status post  epidural injection secondary to chronic lumbar pain.   MEDICATIONS:  Include:  1. Lantus insulin 28 units subcutaneous daily.  2. NovoLog sliding scale insulin coverage for elevated blood sugars.  3. Verapamil 240 mg 1 ne p.o. daily.  4. Levofloxacin 175 mcg 1 p.o. daily.  5.  Simvastatin 40 mg p.o. every night.  6. Dilaudid 4 mg p.o. q.4 h. p.r.n. severe pain.  7. Lovenox 150 mg subcutaneous twice daily.  8. Glucovance 2.5/500 one p.o. b.i.d.  9. Ferrous sulfate 325 mg 1 p.o. daily.  10.Benazepril hydrochlorothiazide 20/12.5 mg 1 p.o. daily.  11.Diazepam 5 mg 1 p.o. b.i.d.  12.Relafen 750 mg 1 p.o. b.i.d.   ALLERGIES:  No known drug allergies.   SOCIAL HISTORY:  The patient is currently at the [facility] home  facility.  He is a nonsmoker, nondrinker, and he has no history of  illicit drug usage.   FAMILY HISTORY:  Positive for diabetes in his mother who is deceased,  positive for coronary artery disease in his father who is still living,  positive colon cancer in his father.   REVIEW OF SYSTEMS:  Pertinents mentioned above.   PHYSICAL EXAMINATION FINDINGS:  This is a morbidly obese, 55 year old,  Caucasian male who appears older than his stated age.  He is in  discomfort but no acute distress.  VITAL SIGNS:  Are temperature 102.1, blood pressure 120/66, heart rate  115, respirations 18, O2 saturations 96%.  HEENT EXAMINATION:  Normocephalic, atraumatic.  Pupils equally round  reactive to light.  Extraocular movements are intact.  Funduscopic  benign.  There is no scleral icterus.  Nares are patent bilaterally.  Oropharynx is clear.  NECK:  Is supple.  Full range of motion.  No thyromegaly, adenopathy,  jugular venous distention.  CARDIOVASCULAR:  Mild tachycardia.  No murmurs, gallops or rubs.  LUNGS:  Clear to auscultation bilaterally.  ABDOMEN:  Positive bowel sounds, soft, nontender, nondistended.  No  hepatosplenomegaly.  EXTREMITIES:  With 4+ edema.  The right lower extremity has increased  erythema which is confluent with fluctuance present.  Purulent exudation  appears to be right below the surface.  The patient has  hyperpigmentation of the left lower extremity.  This is to the pretibial  area.  Dorsal pedal pulses are 1+  bilaterally.  NEUROLOGIC EXAMINATION:  The patient is alert and oriented x3.  His  cranial nerves are intact.  Motor and sensory function also intact.  Gait was not assessed.   LABORATORY STUDIES:  White blood cell count 9.7, hemoglobin 11.1,  hematocrit 32.1, platelets 121, neutrophils 88%, lymphocytes 5%.  Pro  time 14.8, INR 1.2, PTT 36.  Sodium 129, potassium 4.1, chloride 100,  CO2 19, BUN 32, creatinine 1.5 and glucose 230.   ASSESSMENT:  A 55 year old male being admitted with:  1. Cellulitis of the right lower extremity.  2. Febrile illness/early sepsis secondary #1.  3. Hyperglycemia with type 2 diabetes mellitus.  4. Mild chronic renal insufficiency.  5. Mild anemia.  6. Mild hyponatremia.  7. History of pulmonary emboli.  8. Morbid obesity.  9. Sleep apnea.   PLAN:  The patient will be admitted and placed on IV antibiotic therapy  of vancomycin, and cefepime will be added.  The patient will be placed  in MRSA isolation.  His regular medications will be continued except the  Glucovance will be held for now secondary to its metformin component.  The patient will be placed on sliding scale insulin coverage, and gentle  IV fluids have been ordered.  The patient will continue on the Lovenox  injections for treatment of his pulmonary embolism.  He is currently not  on Coumadin therapy.  This was not continued following his discharge  from the hospital on July 26.  GI prophylaxis has been ordered.      [name], M.D.  Electronically Signed     HJ/MEDQ  D:  10/10/2008  T:  10/10/2008  Job:  [id number]

## 2010-07-13 NOTE — Discharge Summary (Addendum)
NAMECHAPLIN, [name] NO.:  [id number]  MEDICAL RECORD NO.:  [id number]           PATIENT TYPE:  I  LOCATION:  [id number]                         FACILITY:  [facility]  PHYSICIAN:  Murray Hodgkins, MD    DATE OF BIRTH:  04-Jun-1955  DATE OF ADMISSION:  07/04/2010 DATE OF DISCHARGE:  07/06/2010                        DISCHARGE SUMMARY - REFERRING   PRIMARY CARE DOCTOR:  Dr. Eligah East at [facility].  PRIMARY UROLOGIST:  Lillette Boxer. Dahlstedt, MD  ORTHOPEDIC SURGEON:  Newt Minion, MD  DISCHARGE DIAGNOSES: 1. Right ureteropelvic junction obstructing stone. 2. Acute renal failure. 3. Positive urinalysis. 4. Chronic anemia. 5. Diabetes type 2, uncontrolled. 6. History of deep venous thrombosis/pulmonary embolism. 7. History of septic knee arthritis. 8. Morbid obesity. 9. Obstructive sleep apnea. 10.Abdominal retroperitoneal/pelvic lymphadenopathy.  DISCHARGE MEDICATIONS: 1. Ambien 10 mg p.o. q.h.s. as needed for sleep. 2. Coumadin 6 mg p.o. q.h.s. 3. Flomax 0.4 mg p.o. q.h.s. 4. Simvastatin 10 mg p.o. q.h.s. 5. Folic acid 1 mg p.o. q.h.s. 6. Methocarbamol 500 mg p.o. t.i.d. 7. Ferrous sulfate 325 mg p.o. t.i.d. with meals. 8. Fish oil 3 capsules p.o. b.i.d. 9. Ciprofloxacin 750 mg p.o. b.i.d. 10.Vitamin E 400 units p.o. daily. 11.Vitamin C 1000 mg p.o. daily. 12.Vitamin B12, 500 mcg p.o. daily. 13.Verapamil ER 180 mg p.o. daily. 14.Tradjenta 5 mg p.o. daily. 15.Multivitamins p.o. daily. 16.Selenium 50 mcg p.o. daily. 17.Garlic extract [id number] mg p.o. 2 capsules daily. 18.Chromium picolinate 200 mcg p.o. daily. 19.Synthroid 75 mcg 3 tablets p.o. daily. 20.Vancomycin 1500 mg IV q.12 h. 21.Niaspan 750 mg p.o. q.h.s. 22.oxycodone and acetaminophen 5/325 mg 1 to 2 tablets every 4 hours     as needed for pain. 23.Acetaminophen 500 mg 2 tablets every 6 hours as needed. 24.NovoLog 3 units subcutaneously 4 times daily. 25.NovoLog sliding scale  insulin. 26.Lantus 22 units subcutaneously b.i.d.  DIAGNOSTICS: 1. CT of the abdomen and pelvis without contrast showed an 8 mm mildly     obstructing stone in the right ureteropelvic junction.  Multiple     additional bilateral intrarenal stones.  No obstruction of the left     kidney.  Increasing abdominal retroperitoneal and pelvic     lymphadenopathy. 2. CBC is notable for hemoglobin of 8.8, which appears stable.  White     blood cell count and platelet count within normal limits.  INR is     2.16.  i-STAT basic electrolytes revealed BUN of 32, creatinine of     1.6, and potassium of 4.6.  Urinalysis is grossly positive.  CONSULTATIONS:  Dr. Carolan Clines with Urology on May 7th.  PROCEDURES:  Cystoscopy, right retrograde ureteral pyelogram, right- sided ureteral stone extraction, double-J stent placement by [name] on May 8th.  BRIEF HISTORY:  [name] is a 55 year old man who presents to the [facility] ED with a chief complaint of right flank pain.  He reports the flank pain came on suddenly and rated at 9/10.  Of note, the patient is in a rehab for recent knee septic arthritis.  In the emergency room, CT yielded nonobstructing kidney stone on the right.  Urology was  called and plans were made for a stent to be placed on May 8th.  [facility] was asked to admit, given the patient's multiple comorbidities.  HOSPITAL COURSE BY PROBLEM: 1. Obstructing right ureteropelvic junction stone, status post double-     J stent on May 8th.  The patient was evaluated by Dr. Gaynelle Arabian on     May 7th.  He was put on n.p.o. status for possible cystoscopy and     stent placement that evening.  He went for     the procedure on May 8th. 2. Acute renal failure.  The patient was maintained on IV fluids.  His     metformin, his Lasix, his benazepril, and nabumetone were held.  He     continued with IV fluids, status post stent placement and     creatinine improving.  The  patient's Lasix, metformin, benazepril,     nabumetone, and glyburide/metformin will be discontinued for now.     The patient to follow up with his primary care provider to evaluate     renal function.  We will probably be able to restart these     medications once his renal function has improved.  In addition, his     vancomycin dosage may need to be adjusted for his renal function. 3. Chronic anemia.  The patient followed by [facility].     His hemoglobin remained stable during this hospitalization. 4. Diabetes type 2, uncontrolled according to hemoglobin A1c done in     April.  The patient was managed on Lantus and sliding scale.  His     oral medications were held secondary to problem #2. 5. History of DVT/PE.  His Coumadin was held the night before his     procedure.  After procedure, pharmacy monitored INR and dosed     Coumadin.  The patient will be discharged on his prehospitalization     Coumadin regimen. 6. History of septic knee arthritis.  The patient is on IV vancomycin     and p.o. Cipro.  Pharmacy dosed vancomycin and adjusted for renal     function.  Recommend vancomycin dosage be adjusted for renal     function until it improves.  The patient was a resident at rehab     facility before this hospitalization, will be returning to that     facility.  Dr. Sharol Given was notified of the patient's admission. 7. Morbid obesity. 8. Obstructive sleep apnea.  The patient is to continue on CPAP. 9. Abdominal/retroperitoneal/pelvic lymphadenopathy.  Note that this     has been evaluated in the past with negative pathology in January     2010.  The patient to continue follow up with Oncology/Hematology     at [facility].  PHYSICAL EXAMINATION:  VITAL SIGNS:  Temperature 98.4, blood pressure 128/72, heart rate 99, respirations 20, and saturation 93% on room air. GENERAL:  Awake, alert, and calm. CV:  Regular rate and rhythm.  No murmur, gallop, or rub. RESPIRATORY:  Normal  respiratory effort.  Breath sounds clear to auscultation bilaterally. NEUROLOGIC:  Speech clear.  Facial symmetry.  FOLLOWUP:  The patient is to see [name].  The patient is also to see his primary care provider in 1 week.  A BMET will need to be drawn to evaluate kidney function and to determine whether Lasix, benazepril, and metformin can be restarted.  CONDITION ON DISCHARGE:  The patient is medically ready to be discharged.  Time spent on discharge,  1 hour.     Radene Gunning, NP   ______________________________ Murray Hodgkins, MD    KMB/MEDQ  D:  07/05/2010  T:  07/05/2010  Job:  [id number]  cc:   Lillette Boxer. Dahlstedt, M.D. Fax: [phone]  Newt Minion, MD Fax: [phone]  Dr. Eligah East, [facility].  Electronically Signed by Murray Hodgkins  on 07/13/2010 12:40:12 PM Electronically Signed by Dyanne Carrel  on 07/20/2010 04:48:05 PM

## 2010-07-15 NOTE — H&P (Signed)
NAMESATOSHI, [name]               ACCOUNT NO.:  [id number]   MEDICAL RECORD NO.:  [id number]          PATIENT TYPE:  INP   LOCATION:  A204                          FACILITY:  [facility]   PHYSICIAN:  Bonne Dolores, M.D.    DATE OF BIRTH:  07/06/1955   DATE OF ADMISSION:  12/18/2005  DATE OF DISCHARGE:  LH                                HISTORY & PHYSICAL   CHIEF COMPLAINT:  Leg pain and swelling.   HISTORY OF PRESENT ILLNESS:  This is a 55 year old male with a longstanding  history of diabetes mellitus type 2 with fair control.  He also has  longstanding hypertension, osteoarthritis, hypothyroidism, sleep apnea,  hyperlipidemia, all fairly well compensated.  He has a remote history of  DVT, as well as pulmonary emboli, and is maintained on Coumadin chronically.  He has not had his pro time checked in approximately 3 months.  He is status  post hip replacements x2.   The patient presented to the office with the 2-3 day history of increasingly  severe pain and edema, as well as erythema of his left lower extremity.  He  had fever at the onset of symptoms.  He has had intermittent fever since.  He denies any significant trauma.  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   There is no history of headache, neurologic deficits, chest pain, shortness  of breath, cough, hemoptysis, dyspnea, chest pain, palpitations, syncope,  nausea, vomiting, diarrhea, melena, hematemesis, hematochezia or  genitourinary symptoms.   In the office, it was apparent the patient has a very severe cellulitis  involving the entire lower leg, as well as some extension to the more  proximal region of the thigh.   The patient was admitted with severe cellulitis.  Consider DVT left lower  extremity.   CURRENT MEDICATIONS:  1. Lotensin 20/12.5 daily.  2. Calan SR 240 daily.  3. Voltaren p.r.n.  4. Advicor 50/20 daily.  5. Coumadin 5  alternating with 2.5 daily.  6. Hydrocodone p.r.n.  7. Glucovance 2.5/500.  8. Synthroid 150 mcg daily.   ALLERGIES:  None known.   PAST HISTORY:  As noted above.   SOCIAL HISTORY:  Nonsmoker, nondrinker.   REVIEW OF SYSTEMS:  Negative except as mentioned.   PHYSICAL EXAMINATION:  GENERAL:  A very pleasant male who is alert and  oriented, in no acute distress.  VITAL SIGNS:  He currently is afebrile with a temperature of 97.4, blood  pressure is 110/70, heart rate is approximately 80 and regular, respirations  18 and unlabored.  HEENT:  Normocephalic, atraumatic.  Pupils are equal.  Ears, nose and throat  are benign.  NECK:  Supple without bruits, thyromegaly, lymphadenopathy or masses.  LUNGS:  Clear to auscultation and percussion.  HEART:  Heart sounds are normal without murmurs, rubs or gallops.  They are  somewhat distant.  ABDOMEN:  Nontender, nondistended.  Bowel sounds are intact.  No  organomegaly or masses.  EXTREMITIES:  The left extremity is markedly erythematous and edematous with  some purulent  discharge from the medial aspect.  There are no gross  excoriations or lacerations.  Bevelyn Buckles' sign is equivocal.  There is some  medial thigh tenderness, as well as mild erythema in the region of the knee.  NEUROLOGIC:  Within normal limits.   LABORATORY DATA:  Currently pending.   ASSESSMENT:  Severe cellulitis; consider deep vein thrombosis of the left  lower extremity in a male with the above history.   PLAN:  Admit for broad spectrum antibiotics, local therapy, prompt check of  INR, as well as other metabolic parameters.  D-dimer will also be checked.  Consider Lovenox pending venous Doppler.  Will follow and treat expectantly.      Bonne Dolores, M.D.  Electronically Signed     MC/MEDQ  D:  12/18/2005  T:  12/19/2005  Job:  [id number]

## 2010-07-15 NOTE — H&P (Signed)
NAME:  [name], [name]                         ACCOUNT NO.:  [id number]   MEDICAL RECORD NO.:  [id number]                   PATIENT TYPE:  INP   LOCATION:  2899                                 FACILITY:  [facility]   PHYSICIAN:  Newt Minion, M.D.                DATE OF BIRTH:  09-Oct-1955   DATE OF ADMISSION:  07/17/2002  DATE OF DISCHARGE:                                HISTORY & PHYSICAL   HISTORY OF PRESENT ILLNESS:  The patient is a 55 year old gentleman with  severe osteoarthritis of both hips.  He is status post right total hip  arthroplasty on March 07, 2002.  Complains of left hip pain, unable to  perform activities of daily living due to left hip pain, and presents at  this time for left total hip arthroplasty.   ALLERGIES:  No known drug allergies.   MEDICATIONS:  1. Amaryl 4 mg daily.  2. Verapamil 270 mg daily.  3. Synthroid 50 mcg daily.  4. Celebrex 200 mg daily.  5. Benazepril 20/125 mg daily.  6. Advicor 500 mg p.o. daily.   PAST SURGICAL HISTORY:  1. Positive for tonsillectomy in 1976.  2. A right total hip arthroplasty in January 2004.   SOCIAL HISTORY:  Does not smoke.  Does not drink.  Is married.   FAMILY HISTORY:  Positive for heart disease, diabetes, colon cancer, and  hypertension.   REVIEW OF SYSTEMS:  Positive for arthritis, type 2 diabetes, hypertension,  and hypothyroidism.   PHYSICAL EXAMINATION:  VITAL SIGNS:  Temperature 97.2, pulse 96, respiratory  rate 20, blood pressure 144/84.  GENERAL:  He is in no acute distress.  He is a morbidly obese gentleman.  NECK:  Supple.  No bruits.  LUNGS:  Clear to auscultation.  CARDIOVASCULAR:  Regular rate and rhythm.  EXTREMITIES:  On examination of the left hip, he does have an abductor  lurch.  He has shortening of the femur with the hip fixed in 20 degrees of  external rotation.  He has 0 degrees of internal rotation and 0 degrees of  external rotation; he has fixed 20 degrees of external rotation.   He also  has a flexion contracture and lacks about 20 degrees of full extension.   ASSESSMENT:  Osteoarthritis of the left hip.    PLAN:  The patient is scheduled for a left total hip arthroplasty at this  time.  The risks and benefits were discussed, including infection,  neurovascular injury, dislocation, DVT, pulmonary embolus, need for  additional surgery.  The patient states he understands and wishes to proceed  at this time.                                               Newt Minion, M.D.  MVD/MEDQ  D:  07/17/2002  T:  07/17/2002  Job:  [id number]
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
## 2010-07-15 NOTE — Op Note (Signed)
NAME:  [name], [name]                         ACCOUNT NO.:  [id number]   MEDICAL RECORD NO.:  [id number]                   PATIENT TYPE:  INP   LOCATION:  5040                                 FACILITY:  [facility]   PHYSICIAN:  Newt Minion, M.D.                DATE OF BIRTH:  06-Aug-1955   DATE OF PROCEDURE:  03/07/2002  DATE OF DISCHARGE:                                 OPERATIVE REPORT   PREOPERATIVE DIAGNOSIS:  Osteoarthritis of the right hip.   POSTOPERATIVE DIAGNOSIS:  Osteoarthritis of the right hip.   PROCEDURE:  Right total knee arthroplasty with Osteonics ceramic components,  #10 press fit femur, 60 mm acetabulum with a ceramic liner, head 36 mm with  ceramic head, +0 neck.   SURGEON:  Newt Minion, M.D.   ANESTHESIA:  General.   ESTIMATED BLOOD LOSS:  300 cc.   ANTIBIOTICS:  1 gram of Kefzol.   DISPOSITION:  To PACU in stable condition.   INDICATIONS FOR PROCEDURE:  The patient is a 55 year-old gentleman with  severe osteoarthritis of both hips who presents at this time for a right  total hip arthroplasty.  The patient states that he has failed conservative  care and wishes to proceed with surgery.  The risks and benefits were  discussed, the patient states that he understands and wishes to proceed at  this time.   DESCRIPTION OF PROCEDURE:  The patient was brought to the OR room 15 and  underwent a general anesthetic.  After an adequate level of anesthesia was  obtained the patient was placed in the left lateral decubitus position with  the right side up and his right lower extremity was prepped using Duraprep  drape into a sterile field.  [name] was used to cover all exposed skin.  A  posterolateral incision was made and this was carried down through the tensa  fascia lata which was split.  A Charnley retractor was placed.  The  piriformis and short external rotators were tagged, cut and retracted.  The  capsule was also cued, tagged and retracted.  The head  was dislocated.  The  femoral neck cut was then made one half inch proximal to the calcar.  Attention was then focused on the acetabulum. The acetabulum was reamed  sequentially up through 60 mm and there was good posterior wall thickness  and good lateral wall thickness.  This was tried and then impacted with a 60  mm acetabulum and a +0 liner was then placed. Attention was then focused on  the femur. The femur was sequentially reamed and then broached up to a #10  and this was tried with the 10 trial with a +0, 36 mm head.  The patient  held in full flexion of 90 degrees, internal rotation of 90 degrees and the  hip was stable with no dislocation.  He had full extension and external  rotation was also stable.  There was no tension on the sciatic nerve. The  hip was then dislocated and the distal femoral canal was then reamed up to a  14 for the distal tip.  The centralizer spacer plug was then placed in the  acetabular component. The ceramic 36 mm liner was placed. The femoral stem  was then placed, impacted and the 36 mm head was then inserted and reduced.  The hip was again placed through a full range of motion and was stable.  The  hip throughout the surgery was irrigated with pulse lavage normal saline.  The capsule was repaired using #1 Ethibond.  The short external rotators  were repaired using #1 Ethibond.  The tensa fascia lata was closed using  running #1 Vicryl. The  subcutaneous was closed using 2-0 Vicryl and the skin was closed using  approximated staples. The wound was covered with Adaptic orthopedic sponges,  ABD dressings and Hypofix tape.  A knee immobilizer was applied.  The  patient was extubated and taken to PACU in stable condition.                                               Newt Minion, M.D.    MVD/MEDQ  D:  03/07/2002  T:  03/07/2002  Job:  [id number]

## 2010-07-15 NOTE — Discharge Summary (Signed)
   NAME:  [name], [name]                         ACCOUNT NO.:  [id number]   MEDICAL RECORD NO.:  [id number]                   PATIENT TYPE:  INP   LOCATION:  5018                                 FACILITY:  [facility]   PHYSICIAN:  Newt Minion, M.D.                DATE OF BIRTH:  [birth year]   DATE OF ADMISSION:  07/17/2002  DATE OF DISCHARGE:  07/25/2002                                 DISCHARGE SUMMARY   DISCHARGE DIAGNOSIS:  Osteoarthritis, left hip.   PROCEDURE:  Left total hip arthroplasty.   DISPOSITION:  Discharged to home in stable condition.   HISTORY OF PRESENT ILLNESS:  The patient is a 55 year old gentleman with  avascular necrosis of bilateral hips.  He is status post a right total hip  arthroplasty and has progressed well presenting at this time for a left  total hip arthroplasty.  The risks and benefits were discussed.  The patient  states that he understands and wishes to proceed at this time.   HOSPITAL COURSE:  The patient's hospital course was essentially  unremarkable.  He underwent a left total hip arthroplasty on May 20, with  Osteonics component ceramic on ceramic with a 60 mm acetabular, #10 femur,  +0 neck with a 36 mm ceramic head.  The patient received 1 g of Kefzol  preoperatively and was treated with 24 hours of Kefzol for infection  prophylaxis.  The patient was also treated with Coumadin and compression  hose for DVT prophylaxis.  The patient was seen with physical therapy and  progressed well with physical therapy.  He was discharged to home in stable  condition May 28.   FOLLOW UP:  Follow up in the office in one week to harvest the staples.                                               Newt Minion, M.D.    MVD/MEDQ  D:  08/12/2002  T:  08/12/2002  Job:  [id number]

## 2010-07-15 NOTE — Discharge Summary (Signed)
   NAME:  [name], [name]                         ACCOUNT NO.:  [id number]   MEDICAL RECORD NO.:  [id number]                   PATIENT TYPE:  INP   LOCATION:  5040                                 FACILITY:  [facility]   PHYSICIAN:  Newt Minion, M.D.                DATE OF BIRTH:  04-10-1955   DATE OF ADMISSION:  03/07/2002  DATE OF DISCHARGE:  03/13/2002                                 DISCHARGE SUMMARY   DIAGNOSIS:  Osteoarthritis, right hip.   PROCEDURE:  Right total hip arthroplasty.   DISPOSITION:  Discharged to rehabilitation in stable condition.   HISTORY OF PRESENT ILLNESS:  The patient is a 55 year old gentleman with  chronic osteoarthritis of both hips.  The patient has failed conservative  care and has been unable to perform activities of daily living and presented  at this time for a right total hip arthroplasty.  The patient underwent a  right total hip arthroplasty on March 07, 2002, with a #10 femur, 60-mm  ceramic liner, 36-mm ceramic head, with a +0 neck.  The patient was given  Kefzol for infection prophylaxis postoperatively and was started on Coumadin  for DVT prophylaxis.  The patient progressed extremely slow with his  physical therapy.  Rehabilitation was consulted and the patient was felt to  be a good candidate for rehabilitation.  The patient was discharged to  rehabilitation in stable condition on January 15, will follow up in the  office one week after discharge.                                               Newt Minion, M.D.    MVD/MEDQ  D:  04/17/2002  T:  04/17/2002  Job:  [id number]

## 2010-07-15 NOTE — Op Note (Signed)
NAME:  [name], [name]                         ACCOUNT NO.:  [id number]   MEDICAL RECORD NO.:  [id number]                   PATIENT TYPE:  INP   LOCATION:  5018                                 FACILITY:  [facility]   PHYSICIAN:  Newt Minion, M.D.                DATE OF BIRTH:  01-Jun-1955   DATE OF PROCEDURE:  07/17/2002  DATE OF DISCHARGE:                                 OPERATIVE REPORT   PREOPERATIVE DIAGNOSIS:  Osteoarthritis of left hip.   POSTOPERATIVE DIAGNOSIS:  Osteoarthritis of left hip.   OPERATION PERFORMED:  Left total hip arthroplasty with ceramic Osteonic 60  mm acetabulum, #10 femur, +0 neck, ceramic 36 mm head.   SURGEON:  Newt Minion, M.D.   ANESTHESIA:  General.   ESTIMATED BLOOD LOSS:  32mL.   ANTIBIOTICS:  1g Kefzol.   DISPOSITION:  To  PACU in stable condition.   INDICATIONS FOR PROCEDURE:  The patient is a 55 year old gentleman with  severe osteoarthritis of both hips.  He is status post a right total hip  arthroplasty and presents at this time for left total hip arthroplasty.  The  risks and benefits were discussed including infection, neurovascular injury,  persistent pain, fracture of the bone, failure of the prosthesis, DVT,  pulmonary embolus, death as well as dislocation of the hip.  The patient  states he understands and wishes to proceed at this time.   DESCRIPTION OF PROCEDURE:  The patient was brought to the operating room 5  and underwent a general anesthetic.  After adequate level of anesthesia  obtained, the patient was placed in the right lateral decubitus position  with the right side down, left side up and the left lower extremity was  prepped using DuraPrep and draped into a sterile field.  A posterolateral  incision was made.  This was carried down to the tensor fascia lata and a  Charnley retractor was placed.  The piriformis and short external rotators  were tagged, cut and retracted.  The capsule was T'd, tagged and  retracted.  The hip was dislocated and the femoral cut was made 1 cm proximal to the  calcar.  Attention was first focused on the acetabulum.  The acetabulum was  sequentially reamed to 60 mm and a 60 mm acetabular liner was placed.  A  trial liner was then placed.  The femur was then sequentially reamed and  broached for a #10 femur.  This was distally reamed to 14.5 for the 14 mm  distal tip.  With the trial broach in place, the hip was placed through a  range of motion.  He had full flexion of 100 degrees and had internal  rotation of 70 degrees with full abduction.  He had extension with external  rotation without any instability.  The trial inserts and liner were removed.  The centralizer plug was placed in the acetabulum and the ceramic  liner was  placed.  The wounds were irrigated continuously throughout the case.  After  the permanent ceramic liner was inserted, the femoral stem was then impacted  and the 36 mm head was inserted.  The hip was again placed through a range  of motion and the patient had full flexion of 100 degrees, full abduction  with internal rotation to 70 degrees and this was stable without  impingement.  He also had full extension with external rotation with no  dislocation.  The wound was again irrigated, the capsule was reapproximated  and the piriformis and short external rotators were reapproximated.  The  tensor  fascia lata was closed using running #1 Vicryl.  The subcu was closed using  2-0 Vicryl.  The skin was closed using Proximate staples.  The wound was  covered with adaptic orthopedic sponges, ABD dressing and HypaFix tape.  The  patient was extubated and taken to post anesthesia care unit in stable  condition.                                                Newt Minion, M.D.    MVD/MEDQ  D:  07/17/2002  T:  07/17/2002  Job:  [id number]

## 2010-07-15 NOTE — H&P (Signed)
   NAME:  [name], AVERA                         ACCOUNT NO.:  [id number]   MEDICAL RECORD NO.:  [id number]                   PATIENT TYPE:  INP   LOCATION:  2550                                 FACILITY:  [facility]   PHYSICIAN:  Newt Minion, M.D.                DATE OF BIRTH:  Feb 20, 1956   DATE OF ADMISSION:  03/07/2002  DATE OF DISCHARGE:                                HISTORY & PHYSICAL   HISTORY OF PRESENT ILLNESS:  The patient is a 55 year old gentleman with  severe chronic osteoarthritis both hips.  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  He is unable to perform activities of daily  living and presents at this time for total hip arthroplasty.  The patient  states he has failed conservative care and wishes to proceed with surgery at  this time.   ALLERGIES:  No known drug allergies.   MEDICATIONS:  1. Amaryl 4 mg daily.  2. Lotensin.  3. Hydrochlorothiazide.  4. Verapamil.  5. Advair.  6. Synthroid.   PAST MEDICAL HISTORY:  Significant for tonsillectomy in 1976.   SOCIAL HISTORY:  Negative tobacco, negative alcohol.  He is married.   FAMILY HISTORY:  Positive for diabetes, heart disease, colon cancer, and  hypertension.   REVIEW OF SYSTEMS:  Positive for arthritis, type 2 diabetes, hypertension,  and hypothyroidism.   PHYSICAL EXAMINATION:  VITAL SIGNS:  Temperature 97, pulse 64, respiratory  rate 16, blood pressure 124/82.  GENERAL:  He is morbidly obese and in no acute distress.  NECK:  Supple, no bruits.  LUNGS:  Clear to auscultation.  CARDIOVASCULAR:  Regular rate and rhythm.  EXTREMITIES:  On examination of his right lower extremity, he has a fixed  external rotation of 20 degrees with as fixed flexion contracture of  approximately 20 degrees.  He does have an abduction enlarged.   LABORATORY DATA:  Radiographs show severe osteoarthritis of both hips.   ASSESSMENT:  Severe osteoarthritis of both hips with right hip more   symptomatic.    PLAN:  The patient is scheduled for right total hip arthroplasty at this  time.  The risks and benefits were discussed including infection,  neurovascular injury, DVT, pulmonary embolus, death, leg length inequality,  and dislocation.  The patient states he understands and wishes to proceed at  this time.                                               Newt Minion, M.D.    MVD/MEDQ  D:  03/07/2002  T:  03/07/2002  Job:  [id number]

## 2010-07-15 NOTE — Discharge Summary (Signed)
NAMERUSTIN, MCGONIGLE NO.:  [id number]   MEDICAL RECORD NO.:  [id number]          PATIENT TYPE:  INP   LOCATION:  3029                         FACILITY:  [facility]   PHYSICIAN:  Bonne Dolores, M.D.    DATE OF BIRTH:  [birth year]   DATE OF ADMISSION:  12/18/2005  DATE OF DISCHARGE:  10/29/2007LH                               DISCHARGE SUMMARY   DISCHARGE DIAGNOSES:  1. Severe cellulitis of the left lower extremity, progressive despite      broad-spectrum antibiotics, requiring emergent transfer.  2. Longstanding diabetes type 2 with fair control.  3. Morbid obesity.  4. Longstanding hypertension.  5. Osteoarthritis.  6. Hypothyroidism, compensated.  7. Sleep apnea on continuous positive airway pressure.  8. Hyperlipidemia, controlled.  9. Chronic Coumadin maintenance secondary to deep venous      thrombosis/pulmonary emboli.  10.Status post hip replacement x2.   For details regarding admission, please refer to the admitting note.  Briefly, this 55 year old male with the above history presented to the  office with a very severe cellulitis involving the entire lower leg as  well as some extension to the more proximal region of the thigh.  He was  admitted with a severe cellulitis.   COURSE IN THE HOSPITAL:  The patient was placed on Levaquin empirically.  The following day, the patient showed some progression of the erythema  involving his thigh, and Dr. Aline Brochure was consulted.   Dr. Ruthe Mannan opinion was that this is an aggressive cellulitis in a  diabetic who is at  high risk for further complications, and he strongly  suggested transfer to a tertiary facility.   The patient was transferred to the medical service at [facility]  where consultants such as infectious disease and possibly surgery would  be available.  He was in stable condition at time of discharge.   DISPOSITION:  As per [facility] physicians.      Bonne Dolores, M.D.  Electronically  Signed     MC/MEDQ  D:  01/28/2006  T:  01/29/2006  Job:  [id number]

## 2010-07-15 NOTE — Consult Note (Signed)
[name], [name] NO.:  [id number]   MEDICAL RECORD NO.:  [id number]          PATIENT TYPE:  INP   LOCATION:  A204                          FACILITY:  [facility]   PHYSICIAN:  Carole Civil, M.D.DATE OF BIRTH:  1955-12-18   DATE OF CONSULTATION:  12/19/2005  DATE OF DISCHARGE:  12/19/2005                                   CONSULTATION   CHIEF COMPLAINT:  Severe right leg edema.   HISTORY:  This is a 55 year old male with A history of diabetes type 2,  hypertension, osteoarthritis, status post bilateral total hip replacements,  approximately 3 years ago, history of hypothyroidism, sleep apnea,  hyperlipidemia with history of previous DVT, pulmonary embolus maintained on  chronic Coumadin who presented to Dr. Caron Presume with a severely swollen,  erythematous, cellulitic-looking left leg.   The patient indicates that on Friday he was caught in the rain; and his left  pants leg became soaked.  He went to bed Friday night, and woke up Saturday  morning with a fever.  He subsequently developed severe redness and swelling  of the left lower extremity and presented to Dr. Hulen Luster office with  severe swelling, erythema, bulla formation.  Dr. Caron Presume immediately  admitted him to the hospital; ultrasound was obtained.  Ultrasound showed  good flow to the lower extremity.   Surprisingly his pain level is only a 4-6/10 and actually with medication is  down to 1-3/10  He does not have tight compartments or tender compartments;  they are actually very soft--see below.   MEDICATIONS:  Lotensin, Calan SR, Voltaren p.r.n., Advicor, Coumadin 5  alternate 2.5 daily, Vicodin, Glucovance, and Synthroid.   ALLERGIES:  No known allergies.   PAST HISTORY:  Negative.   SOCIAL HISTORY:  Nonsmoker, nondrinker.   REVIEW OF SYSTEMS:  He was in his normal state of health until this began.   PHYSICAL EXAMINATION:  VITAL SIGNS:  Temperature max 103, current 102.8;  pulse 109;  respiratory rate 18; blood pressure 104/54.  Glucose, 297 by CBG  at 12 noon today; at 6:00 a.m. was 147, and last night and 9:45 was 178.  Room air is 97% saturation.  GENERAL APPEARANCE.  The patient has obesity, but otherwise his development  is full.  He is adequately groomed.  CARDIOVASCULAR EXAM:  Shows a cool, right lower extremity; but normal  dorsalis pedis pulse, and a 1+ posterior tibialis; and the same on the left.  The only exception is that this left leg is warm.  Interestingly, the area  which was covered by his shoe is completely normal.  He has some toe  discoloration from fungal infection, consistent with his diabetes.  Then  from the ankle to the knee area where the compression hose was; and where  the pants leg was soaked; this is the area which is red, swollen with bulla  formation and tender.  There are areas of what appears to be maceration on  the front of the tibial area.  Then in the thigh area, there are various  less red blanching areas consistent with further cellulitic process.  Lymph  nodes were nontender.  I could not tell if they were swollen in the left  groin because of his leg size.  His skin anteriorly has an area of  breakdown.  There are multiple bullae formation; and it is erythematous,  red, and warm to touch; but interestingly, not tender.  Gait not tested.  Right lower extremity inspection, palpation, range of motion assessment--  normal, stability and strength; assessment normal   Left leg he actually has good joint function knee, ankle, and hip.  He bends  his knee and ankle really without pain.  His compartments, again, are  nontender in the anterior, both medial and lateral posterior compartments,  and lateral compartment.  Joints are stable.  Strength is normal.  MENTAL STATUS AND MOOD:  Normal.  PSYCH:  He is alert and oriented x3.  NEUROLOGIC:  He has normal soft touch to palpation in both lower  extremities.   MEDICAL DECISION MAKING:   Venous imaging ultrasound no evidence of DVT left  lower extremity.  A number of soft tissue masses were seen in the upper  thigh, the largest 4.3 x 1.7 consistent with lymph nodes enlargement.  Radiographs are pending.   LABORATORY RESULTS:  White count is 10.2, neutrophil count 91% absolute  neutrophils 9.3--all high numbers.  Platelet count 173,000.  ProTime 23.1,  INR 1.9, D-dimer 0.49.  Electrolytes were normal, except for glucose.  His  BUN and creatinine was 31.6 which is elevated.  Liver tests were done.  C-  reactive protein and sed rate should probably be done.   IMPRESSION:  I think what happened was the patient's leg became wet and the  overlying blue jeans macerated his skin, and allowed portal site for entry  of bacteria which then developed into a very bad cellulitis.  This was in  the setting of a previously compromised leg from DVT with poor circulation  in terms of venous outflow; and subsequently with the diabetes confounding  the situation, he developed a severe cellulitis.  Fortunately he does not  appear to have compartment syndrome or blood clot.   I have recommended to Dr. Caron Presume, by direct verbal communication, that  this patient be transferred to an ID service; and be monitored in a tertiary  care facility.  As this could get worse very quickly, I think that his limb  is at risk mainly because of the diabetic problem.      Carole Civil, M.D.  Electronically Signed     SEH/MEDQ  D:  12/19/2005  T:  12/20/2005  Job:  [id number]   cc:   Bonne Dolores, M.D.  Fax: [phone]

## 2010-07-15 NOTE — Consult Note (Signed)
NAMESALEEM, FUESS               ACCOUNT NO.:  [id number]   MEDICAL RECORD NO.:  [id number]          PATIENT TYPE:  INP   LOCATION:  [place]                         FACILITY:  [facility]   PHYSICIAN:  Marland Kitchen T. Hoxworth, M.D.DATE OF BIRTH:  04-05-1955   DATE OF CONSULTATION:  12/19/2005  DATE OF DISCHARGE:                                 CONSULTATION   CHIEF COMPLAINT:  Fever, swelling, redness left lower extremity.   PRESENT ILLNESS:  I was asked by Dr. Sherryl Manges tonight to evaluate  Mr. Heyser.  He is a 55 year old white male with multiple medical  problems including obesity, adult onset diabetes mellitus and history of  left lower extremity DVT and pulmonary embolus status post total hip  replacement with subsequent postphlebitic syndrome.  He is on chronic  anticoagulation.  Three days ago, the patient stated he noted some fever  but did not have any particular leg symptoms at that time.  Two days  ago, he noted the onset of progressive redness and swelling in his left  lower extremity.  It has been mildly to moderately painful.  He  presented to Dr. Caron Presume in [place] yesterday and was admitted to  [facility] with a severe cellulitis of his left lower  extremity.  He was initially started on Levaquin.  This morning, Dr.  Caron Presume felt that the cellulitis was worse.  The patient was switched  to vancomycin and Zosyn this morning and transferred to [facility]  for further care.  I was asked this evening to evaluate him.  The  patient states he has some mild chronic left lower extremity swelling  from this DVT, but that this is much worse.  Again, he has had some  moderate pain but not severe.  He has been intermittently febrile.  He  has no history of any similar problems.  Denies any known trauma to the  leg.  He has so far had a workup of a Doppler ultrasound which showed no  evidence of DVT and plain films of the leg showing soft tissue swelling.   PAST  MEDICAL HISTORY:  Surgery and bilateral total hip replacement.  Medically, he is followed for obesity, adult onset diabetes mellitus  oral agent controlled, hypertension, DVT with history of pulmonary  embolus on chronic anticoagulation, sleep apnea for which he uses a  CPAP, elevated cholesterol and hypothyroidism.   MEDICATIONS PRIOR TO ADMISSION:  1. Lotensin/HCT 20/12.5 daily.  2. Calan SR 240 mg daily.  3. Advicor 50/20 daily.  4. Coumadin 5 mg alternating to 2.5 mg daily, last dose taken December 18, 2005.  5. Glucovance 2.5/500 mg daily.  6. Synthroid 150 micrograms daily.  7. The patient was ordered to start Lovenox 160 mg q.12h. since      admission but has not started this.  8. He is on vancomycin per pharmacy dosing and Zosyn.   ALLERGIES:  None.   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  Does not smoke cigarettes or drink alcohol.   FAMILY  HISTORY:  Noncontributory.   REVIEW OF SYSTEMS:  Positive general for fever or chills.  RESPIRATORY:  No shortness of breath, no chest pain, cough.  CARDIAC:  No chest pain  or palpitations.  ABDOMEN:  GI no abdominal pain, nausea, vomiting.  EXTREMITIES:  As below.  NEUROLOGIC:  No numbness, weakness, syncope.   PHYSICAL EXAMINATION:  T-max today was 102.4, currently 98.7, blood  pressure is 131/81, heart rate 114, respirations 20.  A urine output has  been adequate.  GENERAL:  He is an obese white male, alert, cooperative in no acute  distress.  SKIN:  Generally warm and dry.  See lower extremities.  HEENT:  Sclerae nonicteric.  The oropharynx is clear.  LUNGS:  Clear without wheezing or increased work of breathing.  CARDIAC:  Regular rate and rhythm.  No murmurs.  ABDOMEN:  Obese, soft, nontender.  EXTREMITIES:  Right lower extremities unremarkable with good palpable  pulses.  The left lower extremity shows 3+ swelling extending up to just  above the knee.  There is marked  circumferential erythema of the left  lower extremity mainly below the knee, extending down to the ankle and  somewhat up into the distal thigh as well.  There are superficial bullae  anteriorly and posteriorly of the pretibial area and calf some of which  have ruptured with serous fluid and the underlying skin is viable  without obvious necrosis.  Pulses are not palpable in the left lower  extremity likely secondary to edema but it appears well perfused.   LABORATORY:  INR today 1.9.  Chemistries yesterday abnormal for a  creatinine of 1.6.  White count yesterday 10.2, hemoglobin 12.8, not  repeated today.   ASSESSMENT/PLAN:  Severe cellulitis of the left lower extremity in this  patient with diabetes and postphlebitic syndrome.  Clinically, I am very  concerned about a necrotizing soft tissue infection such as fasciitis,  although this is not obvious or definite clinically.  I suspect,  however, he will need quite likely extensive incision and drainage and  debridement of the left lower extremity.  His MRI is pending and is due  to be completed in short order.  I will await for the results of this as  it may guide a possible surgery.  The patient has been on a regular diet  and I will make him n.p.o. for possible surgery and hold his Lovenox.  Hopefully, his INR is down to near normal at this point but we will also  type and cross for fresh frozen plasma for possible surgery.  Will  follow closely and review the MRI as soon as this is done.      Darene Lamer. Hoxworth, M.D.  Electronically Signed     BTH/MEDQ  D:  12/19/2005  T:  12/20/2005  Job:  [id number]

## 2010-07-15 NOTE — Discharge Summary (Signed)
NAME:  [name], [name]                         ACCOUNT NO.:  [id number]   MEDICAL RECORD NO.:  [id number]                   PATIENT TYPE:  IPS   LOCATION:  [id number]                                 FACILITY:  [facility]   PHYSICIAN:  Meredith Staggers, M.D.             DATE OF BIRTH:  September 19, 1955   DATE OF ADMISSION:  03/13/2002  DATE OF DISCHARGE:  03/21/2002                                 DISCHARGE SUMMARY   DISCHARGE DIAGNOSES:  1. Right total hip arthroplasty secondary to osteoarthritis 03/07/02.  2. Anemia.  3. Pain management.  4. Coumadin for deep vein thrombosis prophylaxis.  5. Noninsulin-dependent diabetes mellitus.  6. Hypertension.  7. Hypothyroidism.  8. Morbid obesity.  9. Sleep apnea.   HISTORY OF PRESENT ILLNESS:  A 55 year old white male with chronic right hip  pain secondary to osteoarthritis, no relief with conservative care, admitted  03/07/02 for elective right total hip arthroplasty per Dr. Sharol Given. Placed on  Coumadin for deep vein thrombosis prophylaxis, weight bearing as tolerated.  Postoperative pain management with PCA discontinued 03/08/02. Hospital course  uneventful. Minimal assist for transfers. Supervision for ambulation. Latest  INR 2.7, hemoglobin 9.4. Admitted for comprehensive rehab program.   PAST MEDICAL HISTORY:  See discharge diagnoses. He is followed by the  [facility] [place].   PAST SURGICAL HISTORY:  Tonsillectomy, appendectomy.   ALLERGIES:  Denies any allergies.   MEDICATIONS PRIOR TO ADMISSION:  Amaryl, Lotensin, hydrochlorothiazide,  verapamil, Synthroid, Advicor, and Celebrex.   SOCIAL HISTORY:  Lives with his wife in [place]. Independent prior to  admission. Sedentary. He is on disability. One-level home with three to four  steps to entry. Wife works day shift. Parents in [place].   HOSPITAL COURSE:  The patient with progressive gains while on rehab services  with therapies initiated on a b.i.d.  basis. The following issues were  followed during patient's rehab course. Pertaining to Mr. Heitz's right  total hip arthroplasty, surgical site healing nicely, staples had been  removed, total hip precautions were advised. He was weight bearing as  tolerated with follow up with Dr. Sharol Given. Maintained on  Coumadin for deep  vein thrombosis prophylaxis. No bleeding episodes. Latest INR of 2.9. He  will complete Coumadin protocol followed by [facility].  Postoperative anemia on iron supplement. Latest hemoglobin 10, hematocrit  29.7. Pain management with good control on Tylox. Blood sugars remained  controlled on Amaryl. Latest blood sugars 119, 104, and 180. He was advised  to maintain diabetic diet. Blood pressured controlled with home regimen of  Lotensin and hydrochlorothiazide with verapamil. He had no headache, no  dizziness, no orthostatic blood pressure changes. He will continue on  hormone supplement for his hypothyroidism. He was using a BiPAP for his  sleep apnea as prior to hospital admission. Noted morbid obesity of 305  pounds. Dietary had discussed with patient weight loss issues. He had no  bowel or bladder disturbances. Overall for his functional mobility, he was  independent bed mobility, supervision for transfers and ambulation, minimal  assist for stairs, close supervision for activities of daily living. He  would be discharged to home on 03/21/02 with home health physical and  occupational therapy as well as a nurse.   Latest labs showed an INR of 2.9, hemoglobin 10, hematocrit of 29.7, sodium  139, potassium 4.2, BUN 14, creatinine 0.8.   DISCHARGE MEDICATIONS:  1. Coumadin with latest dose 3 mg. He will complete Coumadin protocol,     followed by [facility].  2. Lotensin 20 mg daily.  3. Amaryl 4 mg daily.  4. Hydrochlorothiazide 12.5 mg daily.  5. Synthroid 150 mcg daily.  6. Advicor 500 mg at bedtime.  7. Pravachol 20 mg at bedtime.  8. Verapamil  240 mg daily.  9. Trinsicon twice daily.  10.      Tylox as needed pain.   ACTIVITY:  Weight bearing as tolerated with hip precautions.   DIET:  2,000 calorie ADA.   WOUND CARE:  Clean incision daily with soap and water.   SPECIAL INSTRUCTIONS:  Home health nurse per [facility] to complete  Coumadin protocol. Home health physical and occupational therapy. The  patient would follow up with Dr. Sharol Given of orthopedic services, call for  appointment, and [facility] for medical management.     Lauraine Rinne, P.A.                     Meredith Staggers, M.D.    DA/MEDQ  D:  03/20/2002  T:  03/21/2002  Job:  [id number]   cc:   Newt Minion, M.D.  [place]  [place] [address]  Fax: [place], [place]

## 2010-07-15 NOTE — Discharge Summary (Signed)
[name], [name] NO.:  [id number]   MEDICAL RECORD NO.:  [id number]          PATIENT TYPE:  INP   LOCATION:  3029                         FACILITY:  [facility]   PHYSICIAN:  Jacquelynn Cree, M.D.   DATE OF BIRTH:  [birth year]   DATE OF ADMISSION:  12/19/2005  DATE OF DISCHARGE:  12/25/2005                                 DISCHARGE SUMMARY   PRIMARY CARE PHYSICIAN:  Dr. Caron Presume.   DISCHARGE DIAGNOSES:  1. Severe left lower extremity group A strep (strep pyogenes) cellulitis.  2. Uncontrolled diabetes.  3. Obstructive sleep apnea.  4. Hypothyroidism.  5. Transient transaminitis, resolved.  6. Macrocytic anemia secondary to vitamin B12 deficiency, pernicious      anemia workup in progress.  7. History of deep vein thrombosis/pulmonary embolus on chronic Coumadin.  8. Onychomycosis.  9. Venous stasis.  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  11.Osteoarthritis.  12.Hypertension.   DISCHARGE MEDICATIONS:  1. Synthroid 150 mcg daily.  2. Calan SR 240 mg daily.  3. Silvadene 1% cream to left lower leg twice a day.  4. Glucovance 2.5/500 b.i.d.  5. Lamisil 250 mg daily x3 months.  6. Lantus insulin 30 units subcutaneously daily.  7. Lotensin 20/12.5 daily.  8. Advicor 50/20 daily.  9. Coumadin 5 mg daily, dose to be adjusted based on INR and report faxed      to primary care physician, Dr. Caron Presume.  10.Keflex 500 mg q.6h. x5 more days.  11.Vitamin B12 1 mg IM daily through December 29, 2005 then monthly.  12.NovoLog sliding scale insulin, moderately insulin sensitive scale.  13.Foltx 1 tablet daily.   CONSULTATIONS:  1. Dr. Megan Salon of infectious diseases.  2. Dr. Excell Seltzer of general surgery.   PROCEDURES AND DIAGNOSTIC STUDIES:  1. Left tibia/fibula films on December 19, 2005 showed no acute bony      abnormality.  2. MRI scan of the left lower extremity on December 20, 2005 showed diffuse      soft tissue swelling and edema in the subcutaneous tissues, consistent   with cellulitis.  There may be mild myositis in the lateral hamstring      musculature.  No osteomyelitis or soft tissue abscess.  Small knee      joint effusion was incidentally noted.   DISCHARGE LABORATORY VALUES:  Hemoglobin was 11.4, hematocrit 33.4,  platelets 213, and white blood cell count 11.2.  PT was 24.7, INR 2.1.   BRIEF ADMISSION HPI:  The patient is a 55 year old male sent to [facility] by his primary care physician with severe cellulitis involving the  entire lower leg as well as some extension to the more proximal region of  his thigh.  With his past medical history of DVT in the left lower  extremity, there is some concern of recurrent DVT as well.  He was admitted  for IV antibiotic and further evaluation and workup to rule out fasciitis.   HOSPITAL COURSE:  Problem 1:  SEVERE CELLULITIS OF THE LEFT LOWER EXTREMITY:  The patient was empirically put on Zosyn and cultures were obtained of the  fluid draining from his leg.  Surgery was consulted for concerns of possible  need for debridement.  MRI did not reveal any fasciitis.  There was no  compartment syndrome; however, surgery continued to follow him throughout  the course of his hospitalization.  Cultures did eventually grew  Streptococcus pyogenes and infectious disease consultation was requested and  kindly provided by Dr. Megan Salon to determine the appropriate treatment and  length of time of treatment.  Dr. Megan Salon did recommend treating the  patient onychomycosis and to change the patient's antibiotics to Ancef.  He  was subsequently transitioned over to p.o. Keflex and it was recommended  that he complete a total course of 10 days of antibiotic therapy.  He is  currently on day six.  At this point, he is stable for discharge.  His leg  continues to have blistering, erythema, and drainage of serosanguineous  fluid.  It is beginning to heal and we will send him home with ongoing  surveillance by home  health nurses for wound evaluation at discharge.   Problem 2:  POORLY CONTROLLED DIABETES:  The patient's diabetes is poorly  controlled, likely reflecting his acute infection.  His overall control has  been reasonable with a hemoglobin A1c of 7.6%.  The patient was put on  Lantus and moderately sensitive sliding scale NovoLog insulin q.a.c. and  h.s.  Glucovance was also restarted on a b.i.d. dosing pattern.  His  glucoses remained elevated and he will need to go home on further insulin  therapy.  We have therefore asked the nursing staff to teach the patient how  to self-administer insulin.  He will receive this teaching both in the  hospital and on follow up with home health care nurses.   Problem 3:  OBSTRUCTIVE SLEEP APNEA:  The patient uses CPAP q.h.s. and  continued on this during his hospitalization.   Problem 4:  HYPOTHYROIDISM:  The patient's TSH was checked and found to be  within normal range.  He is continued on his usual home dose of Synthroid.   Problem 5:  MILD TRANSAMINITIS:  The patient did present with some mild  elevation of his liver function studies.  Subsequent testing, his liver  function studies returned to baseline.  He did have hepatitis serologies  that were negative as well.   Problem 6:  MACROCYTIC ANEMIA:  The patient did have a macrocytic anemia  prompting further evaluation with an anemia panel.  Results of the anemia  panel were as follows:  B12 was 149 (reference range 211-911), total iron  was 33, total iron binding capacity was 206, 16% saturation with a B12 level  as outlined above and a folate level of greater than 20.  Ferritin was 658.  These findings were consistent with a mixed pattern including anemia of  chronic disease and B12 deficiency.  Given his B12 deficiency, a workup for  pernicious anemia was undertaken and is pending at the time of this dictation.  The patient's primary care physician should follow up with his  results including  intrinsic factor antibody, methylmalonic acid of serum and  urine.  These results should be available over the next few days and  accessible in the E-chart.   Problem 7:  HISTORY OF PULMONARY EMBOLUS/DEEP VENOUS THROMBOSIS:  The  patient's anticoagulation was initially held in anticipation of possible  surgery.  Once it was clear that he would not be going to surgery, he was  started on therapeutic dose Lovenox and Coumadin.  Once his Coumadin was  therapeutic, Lovenox was discontinued.  His INR is currently therapeutic but  he has required higher doses of Coumadin in the hospital then he had as an  outpatient.  This may be a reflection of interaction of Coumadin with some  of his current medications.  Given this, we will have the [facility]  collect a daily PT/INR and fax these results to his 71 office for  further monitoring of his degree of anticoagulation and adjustment of his  Coumadin dose.   Problem 8:  ONYCHOMYCOSIS:  The patient was started on Lamisil for this.  He  is currently on day two and should complete a three-month course of  treatment to resolve his toe fungus.  This may be the portal of entry for  his cellulitis.  He should have liver function studies monitored  periodically.   Problem 9:  VENOUS STASIS:  The patient should wear TED hose bilaterally.  At this time, however, his lower extremity on the left is blistering and  weeping.  Once this heals up, he can resume TED hose to his left lower  extremity.   Problem 10:  HYPERHOMOCYSTEINEMIA:  The patient does have  hyperhomocysteinemia on testing.  We therefore started him on Foltx  supplementation.  He should take Foltx daily.   DISPOSITION:  The patient is stable for discharge home on p.o. antibiotics.  We will send him home with home health physical therapy and nursing for  vitamin B12 intramuscular injections, daily PT/INR lab draws, wound  surveillance and diabetic teaching.  He should follow  up with his primary  care physician on Friday, December 29, 2005 at 9:45 a.m.  An appointment has  been made for the patient.      Jacquelynn Cree, M.D.  Electronically Signed     CR/MEDQ  D:  12/25/2005  T:  12/25/2005  Job:  [id number]   cc:   Bonne Dolores, M.D.

## 2010-07-20 NOTE — Discharge Summary (Addendum)
  [name], [name]               ACCOUNT NO.:  [id number]  MEDICAL RECORD NO.:  [id number]           PATIENT TYPE:  I  LOCATION:  [id number]                         FACILITY:  [facility]  PHYSICIAN:  Domingo Mend, M.D. DATE OF BIRTH:  29-Nov-1955  DATE OF ADMISSION:  07/04/2010 DATE OF DISCHARGE:                              DISCHARGE SUMMARY   ADDENDUM  DISCHARGE MEDICATIONS:  Discharge medications are as follows.  This list supersedes the previous list dictated May 8. 1. Tradjenta 5 mg p.o. daily. 2. Ditropan 5 mg p.o. every 8 hours as needed for bladder spasms. 3. Verapamil SR 240 mg p.o. daily. 4. Acetaminophen 500 mg two tablets p.o. every 6 hours as needed for     pain. 5. Ambien 10 mg p.o. daily at bedtime as needed for insomnia. 6. Chromium picolinate 200 mcg p.o. daily. 7. Ciprofloxacin 750 mg p.o. b.i.d. 8. Coumadin 6 mg p.o. every evening. 9. Ferrous sulfate 325 mg p.o. t.i.d. with meals. 10.Fish oil 1000 mg three capsules p.o. b.i.d. 11.Folic acid 1 mg p.o. every evening. 12.Garlic extract [id number] mg two capsules p.o. every evening. 13.Lantus 22 units subcutaneously b.i.d. 14.Methocarbamol 500 mg p.o. t.i.d. 15.Multivitamin one tablet p.o. daily. 16.Niacin SR 750 mg p.o. daily at bedtime. 17.NovoLog 2 units subcutaneously four times daily. 18.NovoLog sliding scale 2 to 12 units subcutaneously as needed. 19.Oxycodone/acetaminophen 5/325 mg one to two tablets p.o. every 4     hours as needed for pain. 20.Selenium 50 mcg p.o. daily. 21.Simvastatin 10 mg p.o. daily. 22.Synthroid 75 mcg three tablets p.o. daily. 23.Tamsulosin 0.4 mg p.o. every evening. 24.Vancomycin injection 1500 mg IV every 12 hours. 25.Vitamin B12 500 mcg one tablet p.o. daily. 26.Vitamin C 1000 mg p.o. daily. 27.Vitamin E 400 units one capsule p.o. daily.  MEDICATIONS STOPPED:  Are as follows: 1. Verapamil ER 180 mg p.o. daily. 2. Nabumetone 750 mg p.o. b.i.d. 3. Lasix 40 mg p.o. every evening. 4.  Benazepril 20 mg p.o. daily. 5. Glyburide/metformin 5/500 p.o. b.i.d. 6. Metformin 1000 mg p.o. daily at bedtime.  HOSPITAL COURSE BY PROBLEM:  Acute renal failure.  At the time of discharge the patient's creatinine was trending down.  It was decided to continue to hold his metformin, his Lasix, his benazepril and his nabumetone.  His blood pressure did trend upward.  It was decided to discontinue his verapamil 180 mg daily and increase that to verapamil 240 mg daily.  The patient will need to follow up with his primary care provider in one weeks' time to evaluate kidney function and determine when Lasix, benazepril, and nabumetone can be restarted as well as the metformin.     Radene Gunning, NP   ______________________________ Domingo Mend, M.D.    KMB/MEDQ  D:  07/06/2010  T:  07/06/2010  Job:  [id number]  Electronically Signed by Dyanne Carrel  on 07/20/2010 04:48:03 PM Electronically Signed by Domingo Mend M.D. on 07/21/2010 06:38:09 PM

## 2010-07-20 NOTE — H&P (Signed)
NAME:  [name], [name]               ACCOUNT NO.:  [id number]  MEDICAL RECORD NO.:  [id number]           PATIENT TYPE:  E  LOCATION:  [facility]                         FACILITY:  [place]  PHYSICIAN:  Ulus Hazen I Kamarie Veno, MD      DATE OF BIRTH:  1955/04/11  DATE OF ADMISSION:  06/10/2010 DATE OF DISCHARGE:                             HISTORY & PHYSICAL   ORTHOPEDIC SURGEON:  Newt Minion, MD  CHIEF PRESENTATION:  Bleeding from the surgical site for 1 day and anemia.  HISTORY OF PRESENT ILLNESS:  This is a 55 year old morbid obese Caucasian male who was under the service of Dr. Meridee Score since January 2012 where he has been treated for septic left total hip arthroplasty.  The patient was recently discharged on June 07, 2010, after having infected left total knee arthroplasty status post placement of antibiotic total knee anatomic spacer.  The patient underwent procedure on June 04, 2010, where the patient had left total knee arthroplasty with removal of antibiotic anatomic spacer and placement of Zimmer Stem and constrained of total knee arthroplasty.  The patient was discharged on the rehab on Cipro and vancomycin.  He has a PICC line on his right upper arm and he was also discharged with Unna boot for venostasis bilaterally and lower extremity swelling.  The patient also has history of DVT and pulmonary embolism.  The patient was discharged on Coumadin.  The patient presented today with bleeding from the surgical site.  But, the patient denies any worsening swelling or worsening pain on his surgical site.  He has pain for which he is on pain medication.  In the ED, the patient presented with hemoglobin of 6.6 and hematocrit of 20.8 and hospital service were asked to admit for further evaluation of his anemia.  On deep questioning, the patient has history of anemia, in the past he has followed up with his hematologist at [facility] where last time seeing on 6 week and he  received some iron IV per patient.  He had history of blood transfusion.  As per the patient, last blood transfusion during his hospital stay.  The patient has history of anemia, likely anemia of chronic disease from history of infected knee.  Today, the patient denies any shortness of breath, denies any chest pain, denies any hematemesis or hemoptysis. Denies any nausea or vomiting or dark stool.  The patient denies any ecchymosis of his skin and denies any numbness or weakness of his extremities.  Regarding his left knee, he admitted he have some bleeding from the surgical site and he think one of the staples could be slipped. The patient denies any fever, denies any abnormal swelling.  He denies any abnormal pain other than the usual pain he had after the surgery. He admitted we will like to go to the rehab after fixing the current issue.  PAST MEDICAL HISTORY: 1. History of morbid obesity. 2. History of obstructive sleep apnea. 3. Type 2 diabetes mellitus on insulin. 4. Chronic lower extremity wound and left lower extremity edema and     history of DVT on Coumadin. 5. History  of anemia status post normal upper endoscopy. 6. History of hypertension. 7. Chronic pain. 8. Hypothyroidism. 9. Status post septic left total hip arthroplasty with multiple     irrigation and debridement and placement of antibiotic spacer. 10.Chronic ulceration of his right lower extremity and edema.  MEDICATION:  The patient is on, 1. Cipro 750 twice daily. 2. Vicodin. 3. Vancomycin. 4. Adjuvant Tylenol. 5. Benazepril 30 mg. 6. Vitamin B12. 7. Ferrous sulfate 325 mg t.i.d. 8. Fish oil. 9. Flomax. 10.Folic acid. 11.Lasix. 12.Glyburide and metformin 5/500 twice daily. 13.Lantus 20 unit b.i.d. 14.Levothyroxine. 15.Metformin 1000 mg at bedtime. 16.Niaspan. 17.Zocor. 18.Verapamil 180. 19.Gentamicin. 20.Vitamin C. 21.Coumadin.  PAST SURGICAL HISTORY:  As above.  ALLERGIES:  No known drug  allergies.  FAMILY HISTORY:  History of colon cancer.  He had colonoscopy in 2010 by Dr. Ardis Hughs only showing diverticular disease.  SOCIAL HISTORY:  The patient is recurrently on the rehab.  Denies any smoking cigarette, denies drinking, denies any alcohol or illicit drug abuse.  He had wife and he lives with his father-in-law.  REVIEW OF SYSTEMS:  As per HPI nothing else significant.  PHYSICAL EXAMINATION:  GENERAL:  The patient is morbid obese. VITAL SIGNS:  Temperature 97.6, blood pressure 112/71, pulse rate 97, respiratory rate 96% on room air.  The patient is lying comfortable in bed. HEENT:  Anicteric.  No discharge from ear, eyes, nose, or throat. CHEST:  Bilateral air entry present.  No rhonchi.  No crepitus. HEART:  S1 and S2 distant. ABDOMEN:  Distended, but soft, nontender.  Bowel sounds present. EXTREMITIES:  Bilateral lower extremity edema.  The staples are present. I did not see any active discharges.  There is swelling of his left knee and thigh, but it seem chronic.  Peripheral pulses is kind of diminished.  He has Haematologist on his left.  I did not see any ulcer on the left leg and there is chronic venous changes on his left leg.  There is no obvious tenderness and it seem there is chronic swelling bilaterally and pitting edema.  LABORATORY DATA:  Blood workup did show a hemoglobin of 6.6, hematocrit 20.8, platelet 192, white blood cell 4.2.  INR of 2.17.  Sodium 141, potassium 4.1, chloride 107, glucose 228, BUN 26, and creatinine 1.4.  ASSESSMENT/PLAN: 1. Anemia.  The patient has a history of chronic anemia previously     investigated as IBC in the past.  The patient has upper endoscopy     which was negative and colonoscopy which were also negative.  The     patient did see hematologist at [facility] for evaluation of his     anemia as per the patient.  Currently, the patient's hemoglobin is     6.7, possibility is acute on chronic anemia secondary to post      surgery and current bleeding from the knee site.  I will hold     Coumadin today.  We will transfuse 1 unit of blood.  We will get     anemia panel before blood transfusion.  We will give the patient 4     mg of IV Lasix after transfusion.  Currently, I did feel there is     any evidence of compartment syndrome and venous stasis seem chronic     and the lower edema seem chronic even there is no significant pain     at the site or other thigh.  I will hold on any CT scan at the time  being as the patient is completely asymptomatic.  Dr. Sharol Given     consulted.  He may request some CT scan, Dr. Sharol Given know the patient     better. 2. History of chronic venous stasis and ulcer.  We will ask Wound Care     to see.  The patient has Unna boot at the time being. 3. Surgical site bleeding.  We will hold Coumadin.  I will hold as I     mentioned any CT scan or MRI at the time being.  I did not feel     there is any evidence of bleeding internally, but we will continue     monitoring the patient's symptom and H and H.  Regarding other     medical issue including diabetes, we will resume the patient's     medication and the patient has a history of septic arthritis.  We     will continue with the patient dose of vancomycin and Cipro.  We     will check vancomycin level and pharmacy will dose.  Dr. Sharol Given     consulted.  Further recommendation as hospital course progress.  We     will ask PT and OT to evaluate.     Donaldson Richter Franco Collet, MD     HIE/MEDQ  D:  06/10/2010  T:  06/10/2010  Job:  [id number]  Electronically Signed by Donia Ast MD on 07/20/2010 03:45:01 PM

## 2010-07-27 NOTE — Op Note (Signed)
  NAMEORWIN, ALONZO NO.:  [id number]  MEDICAL RECORD NO.:  [id number]           PATIENT TYPE:  I  LOCATION:  [id number]                         FACILITY:  [facility]  PHYSICIAN:  Lillette Boxer. Laketia Vicknair, M.D.DATE OF BIRTH:  01/16/1956  DATE OF PROCEDURE:  07/05/2010 DATE OF DISCHARGE:                              OPERATIVE REPORT   PREOPERATIVE DIAGNOSIS:  Right ureteral stone.  POSTOPERATIVE DIAGNOSES:  Right ureteral stone.  SURGICAL PROCEDURES:  Cystoscopy, right retrograde ureteral pyelogram, right-sided ureteral stone extraction, double-J stent placement (6- French x 26 cm contour stent without string).  SURGEON:  Lillette Boxer. Refoel Palladino, M.D.  ANESTHESIA:  General endotracheal.  COMPLICATIONS:  None.  SPECIMEN:  Right ureteral stone.  BRIEF HISTORY:  This is a 55 year old male with multiple medical problems, including history of recurrent urolithiasis, pelvic lymphadenopathy, and history of multiple joint replacements with recent infection and spacer placement in the left knee.  He presented recently with right flank pain to the emergency room from this rehab Center.  He was found to have an 8-mm proximal right ureteral stone, a larger right lower pole stone, and hydronephrosis with mild renal insufficiency.  Due to his pain and size of the stone, it was recommended that he undergo stent placement to be followed later by chemolysis of his kidney stones. He does have a history of uric acid stones.  Risks and complications of the procedure have been discussed with the patient.  He understands these and desires to proceed.  DESCRIPTION OF PROCEDURE:  The patient was identified and marked in the holding area.  He was taken to the operating room where general endotracheal anesthetic was administered.  He was placed in the dorsal lithotomy position.  Genitalia and perineum were prepped and draped. Time-out was then performed.  The procedure then commenced.  A  22-French panendoscope was advanced into his bladder.  His urethra was normal and his prostate was not obstructed.  Bladder was entered and inspected circumferentially.  No tumors, trabeculations or foreign bodies were noted.  There was a stone crowning at the right ureteral orifice.  I gently guided a nitinol basket by this and extracted it.  I then performed retrograde ureteral pyelogram.  This showed a significantly dilated right ureter.  There was some narrowing at the right UPJ.  There was mild pyelocaliectasis. There was seemingly filling defect in the lower pole calix.  At this point, I thought it would be worth to place a double-J stent for at least temporary management of his hydronephrosis. A 26 cm x 6-French contour stent was placed without the string.  Good proximal and distal curls were seen.  The bladder was drained and the scope removed.  The patient tolerated the procedure well.  He was awakened and taken to PACU in stable condition.     Lillette Boxer. Diona Fanti, M.D.     SMD/MEDQ  D:  07/05/2010  T:  07/05/2010  Job:  [id number]  Electronically Signed by Franchot Gallo M.D. on 07/27/2010 12:49:47 PM

## 2010-09-01 ENCOUNTER — Ambulatory Visit: Payer: Medicare Other | Attending: Orthopedic Surgery | Admitting: Physical Therapy

## 2010-09-01 DIAGNOSIS — M25669 Stiffness of unspecified knee, not elsewhere classified: Secondary | ICD-10-CM | POA: Insufficient documentation

## 2010-09-01 DIAGNOSIS — Z96659 Presence of unspecified artificial knee joint: Secondary | ICD-10-CM | POA: Insufficient documentation

## 2010-09-01 DIAGNOSIS — R262 Difficulty in walking, not elsewhere classified: Secondary | ICD-10-CM | POA: Insufficient documentation

## 2010-09-01 DIAGNOSIS — IMO0001 Reserved for inherently not codable concepts without codable children: Secondary | ICD-10-CM | POA: Insufficient documentation

## 2010-09-01 DIAGNOSIS — M25569 Pain in unspecified knee: Secondary | ICD-10-CM | POA: Insufficient documentation

## 2010-09-06 ENCOUNTER — Encounter: Payer: Medicare Other | Admitting: Rehabilitation

## 2010-09-08 ENCOUNTER — Ambulatory Visit: Payer: Medicare Other | Admitting: Physical Therapy

## 2010-09-14 ENCOUNTER — Ambulatory Visit: Payer: Medicare Other | Admitting: Physical Therapy

## 2010-09-15 ENCOUNTER — Ambulatory Visit: Payer: Medicare Other | Admitting: Physical Therapy

## 2010-09-19 ENCOUNTER — Ambulatory Visit: Payer: Medicare Other | Admitting: Physical Therapy

## 2010-09-21 ENCOUNTER — Ambulatory Visit: Payer: Medicare Other | Admitting: Physical Therapy

## 2010-09-26 ENCOUNTER — Ambulatory Visit: Payer: Medicare Other | Admitting: Physical Therapy

## 2010-09-28 ENCOUNTER — Ambulatory Visit: Payer: Medicare Other | Attending: Orthopedic Surgery | Admitting: Physical Therapy

## 2010-09-28 DIAGNOSIS — IMO0001 Reserved for inherently not codable concepts without codable children: Secondary | ICD-10-CM | POA: Insufficient documentation

## 2010-09-28 DIAGNOSIS — R262 Difficulty in walking, not elsewhere classified: Secondary | ICD-10-CM | POA: Insufficient documentation

## 2010-09-28 DIAGNOSIS — M25569 Pain in unspecified knee: Secondary | ICD-10-CM | POA: Insufficient documentation

## 2010-09-28 DIAGNOSIS — M25669 Stiffness of unspecified knee, not elsewhere classified: Secondary | ICD-10-CM | POA: Insufficient documentation

## 2010-09-28 DIAGNOSIS — Z96659 Presence of unspecified artificial knee joint: Secondary | ICD-10-CM | POA: Insufficient documentation

## 2010-10-03 ENCOUNTER — Ambulatory Visit: Payer: Medicare Other | Admitting: Physical Therapy

## 2010-10-06 ENCOUNTER — Ambulatory Visit: Payer: Medicare Other | Admitting: Physical Therapy

## 2010-10-10 ENCOUNTER — Ambulatory Visit: Payer: Medicare Other | Admitting: Physical Therapy

## 2010-10-14 ENCOUNTER — Ambulatory Visit: Payer: Medicare Other | Admitting: Physical Therapy

## 2010-10-18 ENCOUNTER — Ambulatory Visit: Payer: Medicare Other | Admitting: Physical Therapy

## 2010-10-21 ENCOUNTER — Ambulatory Visit: Payer: Medicare Other | Admitting: Physical Therapy

## 2010-10-26 ENCOUNTER — Ambulatory Visit: Payer: Medicare Other | Admitting: Physical Therapy

## 2010-10-28 ENCOUNTER — Ambulatory Visit: Payer: Medicare Other | Admitting: Physical Therapy

## 2010-11-02 ENCOUNTER — Ambulatory Visit: Payer: Medicare Other | Attending: Orthopedic Surgery | Admitting: Physical Therapy

## 2010-11-02 DIAGNOSIS — IMO0001 Reserved for inherently not codable concepts without codable children: Secondary | ICD-10-CM | POA: Insufficient documentation

## 2010-11-02 DIAGNOSIS — Z96659 Presence of unspecified artificial knee joint: Secondary | ICD-10-CM | POA: Insufficient documentation

## 2010-11-02 DIAGNOSIS — R262 Difficulty in walking, not elsewhere classified: Secondary | ICD-10-CM | POA: Insufficient documentation

## 2010-11-02 DIAGNOSIS — M25669 Stiffness of unspecified knee, not elsewhere classified: Secondary | ICD-10-CM | POA: Insufficient documentation

## 2010-11-02 DIAGNOSIS — M25569 Pain in unspecified knee: Secondary | ICD-10-CM | POA: Insufficient documentation

## 2010-11-08 ENCOUNTER — Ambulatory Visit: Payer: Medicare Other | Admitting: Physical Therapy

## 2010-11-11 ENCOUNTER — Ambulatory Visit: Payer: Medicare Other | Admitting: Physical Therapy

## 2010-11-16 ENCOUNTER — Encounter: Payer: Medicare Other | Admitting: Physical Therapy

## 2010-11-17 ENCOUNTER — Ambulatory Visit: Payer: Medicare Other | Admitting: Rehabilitation

## 2010-11-23 ENCOUNTER — Ambulatory Visit: Payer: Medicare Other | Admitting: Rehabilitation

## 2010-11-24 ENCOUNTER — Ambulatory Visit: Payer: Medicare Other | Admitting: Physical Therapy

## 2010-11-24 LAB — PROTIME-INR
INR: 1.1
Prothrombin Time: 14.4

## 2010-11-24 LAB — CBC
MCHC: 33.4
MCV: 89.3
Platelets: 221
RDW: 14

## 2010-11-28 LAB — CULTURE, BLOOD (ROUTINE X 2): Culture: NO GROWTH

## 2010-11-28 LAB — DIFFERENTIAL
Basophils Absolute: 0
Basophils Relative: 0
Lymphocytes Relative: 9 — ABNORMAL LOW
Neutro Abs: 6.3
Neutrophils Relative %: 85 — ABNORMAL HIGH

## 2010-11-28 LAB — GLUCOSE, CAPILLARY
Glucose-Capillary: 161 — ABNORMAL HIGH
Glucose-Capillary: 178 — ABNORMAL HIGH
Glucose-Capillary: 204 — ABNORMAL HIGH
Glucose-Capillary: 211 — ABNORMAL HIGH
Glucose-Capillary: 219 — ABNORMAL HIGH
Glucose-Capillary: 220 — ABNORMAL HIGH
Glucose-Capillary: 222 — ABNORMAL HIGH
Glucose-Capillary: 226 — ABNORMAL HIGH
Glucose-Capillary: 233 — ABNORMAL HIGH
Glucose-Capillary: 256 — ABNORMAL HIGH
Glucose-Capillary: 270 — ABNORMAL HIGH
Glucose-Capillary: 282 — ABNORMAL HIGH
Glucose-Capillary: 287 — ABNORMAL HIGH

## 2010-11-28 LAB — BASIC METABOLIC PANEL
BUN: 10
BUN: 9
CO2: 26
CO2: 29
Calcium: 8 — ABNORMAL LOW
Calcium: 8.3 — ABNORMAL LOW
Calcium: 8.9
Chloride: 100
Chloride: 101
Chloride: 98
Creatinine, Ser: 0.6
Creatinine, Ser: 0.69
Creatinine, Ser: 0.72
GFR calc Af Amer: >60
GFR calc Af Amer: >60
Glucose, Bld: 187 — ABNORMAL HIGH
Glucose, Bld: 203 — ABNORMAL HIGH

## 2010-11-28 LAB — POCT I-STAT, CHEM 8
BUN: 29 — ABNORMAL HIGH
Chloride: 102
HCT: 35 — ABNORMAL LOW
Potassium: 3.5
Sodium: 139

## 2010-11-28 LAB — CBC
Hemoglobin: 10.2 — ABNORMAL LOW
Hemoglobin: 11.5 — ABNORMAL LOW
MCHC: 32.4
MCHC: 32.8
MCHC: 33.1
MCHC: 33.2
MCV: 84.1
MCV: 84.5
Platelets: 238
Platelets: 231
RBC: 3.64 — ABNORMAL LOW
RDW: 16.5 — ABNORMAL HIGH
RDW: 16.7 — ABNORMAL HIGH
RDW: 16.7 — ABNORMAL HIGH
RDW: 17 — ABNORMAL HIGH

## 2010-11-28 LAB — URINALYSIS, MICROSCOPIC ONLY
Bilirubin Urine: NEGATIVE
Hgb urine dipstick: NEGATIVE
Nitrite: NEGATIVE
Specific Gravity, Urine: >1.046 — ABNORMAL HIGH
Urobilinogen, UA: 1
pH: 5.5

## 2010-11-28 LAB — PROTIME-INR
INR: 2.2 — ABNORMAL HIGH
INR: 2.3 — ABNORMAL HIGH
INR: 2.4 — ABNORMAL HIGH
INR: 2.6 — ABNORMAL HIGH
INR: 1.8 — ABNORMAL HIGH
Prothrombin Time: 26.1 — ABNORMAL HIGH
Prothrombin Time: 26.5 — ABNORMAL HIGH
Prothrombin Time: 28 — ABNORMAL HIGH
Prothrombin Time: 29 — ABNORMAL HIGH
Prothrombin Time: 29.7 — ABNORMAL HIGH
Prothrombin Time: 22.3 — ABNORMAL HIGH

## 2010-11-28 LAB — POCT I-STAT 3, ART BLOOD GAS (G3+)
Patient temperature: 100
TCO2: 30
pCO2 arterial: 47.8 — ABNORMAL HIGH
pH, Arterial: 7.389

## 2010-11-28 LAB — VITAMIN B12: Vitamin B-12: 256 (ref 211–911)

## 2010-11-28 LAB — AMMONIA: Ammonia: 37 — ABNORMAL HIGH

## 2010-11-28 LAB — HEMOGLOBIN A1C
Hgb A1c MFr Bld: 7.9 — ABNORMAL HIGH
Mean Plasma Glucose: 180

## 2010-11-28 LAB — B-NATRIURETIC PEPTIDE (CONVERTED LAB): Pro B Natriuretic peptide (BNP): 64

## 2010-11-28 LAB — FOLATE: Folate: >20

## 2010-11-28 LAB — URINE CULTURE

## 2010-11-29 ENCOUNTER — Ambulatory Visit: Payer: Medicare Other | Admitting: Physical Therapy

## 2010-11-29 LAB — CROSSMATCH: Antibody Screen: NEGATIVE

## 2010-11-29 LAB — COMPREHENSIVE METABOLIC PANEL
ALT: 21
Calcium: 9.4
Glucose, Bld: 149 — ABNORMAL HIGH
Sodium: 139
Total Protein: 6.1

## 2010-11-29 LAB — BASIC METABOLIC PANEL
BUN: 11
BUN: 13
CO2: 28
Calcium: 8.2 — ABNORMAL LOW
Chloride: 97
Chloride: 98
GFR calc Af Amer: >60
GFR calc non Af Amer: >60
GFR calc non Af Amer: >60
Potassium: 3.8
Potassium: 3.9
Sodium: 132 — ABNORMAL LOW
Sodium: 134 — ABNORMAL LOW
Sodium: 135

## 2010-11-29 LAB — CBC
HCT: 24.2 — ABNORMAL LOW
HCT: 27.8 — ABNORMAL LOW
HCT: 27.9 — ABNORMAL LOW
Hemoglobin: 10.2 — ABNORMAL LOW
Hemoglobin: 8.1 — ABNORMAL LOW
Hemoglobin: 9.2 — ABNORMAL LOW
Hemoglobin: 9.2 — ABNORMAL LOW
MCHC: 32.2
MCHC: 33.3
MCV: 77.7 — ABNORMAL LOW
MCV: 78.9
Platelets: 207
Platelets: 210
Platelets: 232
RBC: 3.11 — ABNORMAL LOW
RBC: 3.53 — ABNORMAL LOW
RDW: 17.6 — ABNORMAL HIGH
RDW: 18 — ABNORMAL HIGH
WBC: 6.4
WBC: 6.4

## 2010-11-29 LAB — GLUCOSE, CAPILLARY
Glucose-Capillary: 165 — ABNORMAL HIGH
Glucose-Capillary: 166 — ABNORMAL HIGH
Glucose-Capillary: 168 — ABNORMAL HIGH
Glucose-Capillary: 178 — ABNORMAL HIGH
Glucose-Capillary: 183 — ABNORMAL HIGH
Glucose-Capillary: 190 — ABNORMAL HIGH
Glucose-Capillary: 195 — ABNORMAL HIGH
Glucose-Capillary: 198 — ABNORMAL HIGH
Glucose-Capillary: 201 — ABNORMAL HIGH
Glucose-Capillary: 205 — ABNORMAL HIGH
Glucose-Capillary: 207 — ABNORMAL HIGH
Glucose-Capillary: 224 — ABNORMAL HIGH
Glucose-Capillary: 226 — ABNORMAL HIGH
Glucose-Capillary: 228 — ABNORMAL HIGH
Glucose-Capillary: 244 — ABNORMAL HIGH
Glucose-Capillary: 255 — ABNORMAL HIGH
Glucose-Capillary: 266 — ABNORMAL HIGH

## 2010-11-29 LAB — PROTIME-INR
INR: 1.1
INR: 1.4
INR: 1.9 — ABNORMAL HIGH
INR: 2.4 — ABNORMAL HIGH
Prothrombin Time: 14.8
Prothrombin Time: 23.1 — ABNORMAL HIGH
Prothrombin Time: 27.9 — ABNORMAL HIGH

## 2010-11-29 LAB — ABO/RH: ABO/RH(D): O POS

## 2010-12-01 ENCOUNTER — Ambulatory Visit: Payer: Medicare Other | Admitting: Physical Therapy

## 2010-12-01 LAB — POCT I-STAT, CHEM 8
BUN: 16 mg/dL (ref 6–23)
Chloride: 103 mEq/L (ref 96–112)
HCT: 31 % — ABNORMAL LOW (ref 39.0–52.0)
Potassium: 3.8 mEq/L (ref 3.5–5.1)

## 2010-12-01 LAB — URINALYSIS, ROUTINE W REFLEX MICROSCOPIC
Bilirubin Urine: NEGATIVE
Glucose, UA: NEGATIVE mg/dL
Nitrite: NEGATIVE
Specific Gravity, Urine: 1.024 (ref 1.005–1.030)
pH: 5 (ref 5.0–8.0)

## 2010-12-01 LAB — HEPATIC FUNCTION PANEL
ALT: 14 U/L (ref 0–53)
AST: 17 U/L (ref 0–37)
Total Protein: 6.6 g/dL (ref 6.0–8.3)

## 2011-01-27 ENCOUNTER — Other Ambulatory Visit: Payer: Self-pay | Admitting: Orthopedic Surgery

## 2011-01-27 DIAGNOSIS — M79672 Pain in left foot: Secondary | ICD-10-CM

## 2011-01-30 ENCOUNTER — Ambulatory Visit
Admission: RE | Admit: 2011-01-30 | Discharge: 2011-01-30 | Disposition: A | Discharge status: Home / Self Care | Payer: Medicare Other | Source: Ambulatory Visit | Attending: Orthopedic Surgery | Admitting: Orthopedic Surgery

## 2011-01-30 DIAGNOSIS — M79672 Pain in left foot: Secondary | ICD-10-CM

## 2011-01-30 MED ORDER — GADOBENATE DIMEGLUMINE 529 MG/ML IV SOLN
20.0000 mL | Freq: Once | INTRAVENOUS | Status: AC | PRN
  Administered 2011-01-30: 20 mL via INTRAVENOUS

## 2011-02-01 ENCOUNTER — Encounter (HOSPITAL_COMMUNITY): Payer: Self-pay | Admitting: General Practice

## 2011-02-01 ENCOUNTER — Inpatient Hospital Stay (HOSPITAL_COMMUNITY)
Admission: AD | Admit: 2011-02-01 | Discharge: 2011-02-10 | DRG: 579 | Disposition: A | Discharge status: Skilled Nursing Facility | Payer: Medicare Other | Source: Ambulatory Visit | Attending: Orthopedic Surgery | Admitting: Orthopedic Surgery

## 2011-02-01 DIAGNOSIS — I96 Gangrene, not elsewhere classified: Secondary | ICD-10-CM | POA: Diagnosis present

## 2011-02-01 DIAGNOSIS — L02619 Cutaneous abscess of unspecified foot: Secondary | ICD-10-CM | POA: Diagnosis present

## 2011-02-01 DIAGNOSIS — L8994 Pressure ulcer of unspecified site, stage 4: Secondary | ICD-10-CM | POA: Diagnosis present

## 2011-02-01 DIAGNOSIS — M869 Osteomyelitis, unspecified: Secondary | ICD-10-CM | POA: Diagnosis present

## 2011-02-01 DIAGNOSIS — L89609 Pressure ulcer of unspecified heel, unspecified stage: Principal | ICD-10-CM | POA: Diagnosis present

## 2011-02-01 DIAGNOSIS — E119 Type 2 diabetes mellitus without complications: Secondary | ICD-10-CM

## 2011-02-01 HISTORY — DX: Chronic kidney disease, unspecified: N18.9

## 2011-02-01 HISTORY — DX: Unspecified osteoarthritis, unspecified site: M19.90

## 2011-02-01 HISTORY — DX: Reserved for inherently not codable concepts without codable children: IMO0001

## 2011-02-01 HISTORY — DX: Essential (primary) hypertension: I10

## 2011-02-01 HISTORY — DX: Hypothyroidism, unspecified: E03.9

## 2011-02-01 HISTORY — DX: Anemia, unspecified: D64.9

## 2011-02-01 HISTORY — DX: Encounter for other specified aftercare: Z51.89

## 2011-02-01 HISTORY — DX: Sleep apnea, unspecified: G47.30

## 2011-02-01 LAB — BASIC METABOLIC PANEL
CO2: 20 mEq/L (ref 19–32)
Glucose, Bld: 261 mg/dL — ABNORMAL HIGH (ref 70–99)
Potassium: 5 mEq/L (ref 3.5–5.1)
Sodium: 136 mEq/L (ref 135–145)

## 2011-02-01 LAB — GLUCOSE, CAPILLARY: Glucose-Capillary: 267 mg/dL — ABNORMAL HIGH (ref 70–99)

## 2011-02-01 LAB — HEMOGLOBIN A1C: Mean Plasma Glucose: 146 mg/dL — ABNORMAL HIGH (ref <117)

## 2011-02-01 MED ORDER — SODIUM CHLORIDE 0.9 % IV SOLN
INTRAVENOUS | Status: DC
  Administered 2011-02-01: 1000 mL via INTRAVENOUS
  Administered 2011-02-03: 11:00:00 via INTRAVENOUS
  Administered 2011-02-06: 20 mL/h via INTRAVENOUS
  Administered 2011-02-09: 18:00:00 via INTRAVENOUS

## 2011-02-01 MED ORDER — HYDROMORPHONE HCL PF 1 MG/ML IJ SOLN
0.5000 mg | INTRAMUSCULAR | Status: DC | PRN
  Administered 2011-02-03: 1 mg via INTRAVENOUS
  Filled 2011-02-01: qty 1

## 2011-02-01 MED ORDER — HYDROMORPHONE HCL PF 1 MG/ML IJ SOLN
1.0000 mg | INTRAMUSCULAR | Status: DC | PRN
  Administered 2011-02-02 – 2011-02-07 (×11): 1 mg via INTRAVENOUS
  Filled 2011-02-01 (×12): qty 1

## 2011-02-01 MED ORDER — ONDANSETRON HCL 4 MG/2ML IJ SOLN: 4.0000 mg | Freq: Four times a day (QID) | INTRAMUSCULAR | Status: DC | PRN

## 2011-02-01 MED ORDER — PIPERACILLIN-TAZOBACTAM 3.375 G IVPB 30 MIN
3.3750 g | Freq: Once | INTRAVENOUS | Status: AC
  Administered 2011-02-01: 3.375 g via INTRAVENOUS
  Filled 2011-02-01: qty 50

## 2011-02-01 MED ORDER — INSULIN ASPART 100 UNIT/ML ~~LOC~~ SOLN
6.0000 [IU] | Freq: Three times a day (TID) | SUBCUTANEOUS | Status: DC
  Administered 2011-02-01 – 2011-02-10 (×25): 6 [IU] via SUBCUTANEOUS

## 2011-02-01 MED ORDER — OXYCODONE-ACETAMINOPHEN 5-325 MG PO TABS
1.0000 | ORAL_TABLET | ORAL | Status: DC | PRN
  Administered 2011-02-04 – 2011-02-05 (×3): 2 via ORAL
  Filled 2011-02-01 (×3): qty 2

## 2011-02-01 MED ORDER — HYDROCODONE-ACETAMINOPHEN 5-325 MG PO TABS
1.0000 | ORAL_TABLET | ORAL | Status: DC | PRN
  Administered 2011-02-01 – 2011-02-06 (×4): 2 via ORAL
  Administered 2011-02-06: 1 via ORAL
  Filled 2011-02-01: qty 2
  Filled 2011-02-01: qty 1
  Filled 2011-02-01 (×5): qty 2

## 2011-02-01 MED ORDER — INSULIN ASPART 100 UNIT/ML ~~LOC~~ SOLN
0.0000 [IU] | Freq: Three times a day (TID) | SUBCUTANEOUS | Status: DC
  Administered 2011-02-01: 11 [IU] via SUBCUTANEOUS
  Administered 2011-02-02: 7 [IU] via SUBCUTANEOUS
  Administered 2011-02-02: 11 [IU] via SUBCUTANEOUS
  Administered 2011-02-02: 7 [IU] via SUBCUTANEOUS
  Administered 2011-02-03: 11 [IU] via SUBCUTANEOUS
  Administered 2011-02-03 – 2011-02-04 (×3): 7 [IU] via SUBCUTANEOUS
  Administered 2011-02-04: 4 [IU] via SUBCUTANEOUS
  Administered 2011-02-04: 7 [IU] via SUBCUTANEOUS
  Administered 2011-02-05: 4 [IU] via SUBCUTANEOUS
  Administered 2011-02-05: 7 [IU] via SUBCUTANEOUS
  Administered 2011-02-05 – 2011-02-06 (×2): 4 [IU] via SUBCUTANEOUS
  Administered 2011-02-06 (×2): 7 [IU] via SUBCUTANEOUS
  Administered 2011-02-07 – 2011-02-08 (×3): 4 [IU] via SUBCUTANEOUS
  Administered 2011-02-08: 7 [IU] via SUBCUTANEOUS
  Administered 2011-02-08: 4 [IU] via SUBCUTANEOUS
  Administered 2011-02-09: 3 [IU] via SUBCUTANEOUS
  Administered 2011-02-09: 4 [IU] via SUBCUTANEOUS
  Administered 2011-02-09 – 2011-02-10 (×2): 3 [IU] via SUBCUTANEOUS
  Administered 2011-02-10 (×2): 7 [IU] via SUBCUTANEOUS
  Filled 2011-02-01 (×2): qty 3

## 2011-02-01 MED ORDER — PIPERACILLIN-TAZOBACTAM 3.375 G IVPB
3.3750 g | Freq: Three times a day (TID) | INTRAVENOUS | Status: DC
  Administered 2011-02-02 – 2011-02-10 (×23): 3.375 g via INTRAVENOUS
  Filled 2011-02-01 (×30): qty 50

## 2011-02-01 MED ORDER — SODIUM CHLORIDE 0.9 % IV SOLN
2000.0000 mg | Freq: Once | INTRAVENOUS | Status: AC
  Administered 2011-02-01: 2000 mg via INTRAVENOUS
  Filled 2011-02-01: qty 2000

## 2011-02-01 MED ORDER — ONDANSETRON HCL 4 MG PO TABS: 4.0000 mg | ORAL_TABLET | Freq: Four times a day (QID) | ORAL | Status: DC | PRN

## 2011-02-01 MED ORDER — METOCLOPRAMIDE HCL 5 MG/ML IJ SOLN
5.0000 mg | Freq: Three times a day (TID) | INTRAMUSCULAR | Status: DC | PRN
  Filled 2011-02-01: qty 2

## 2011-02-01 MED ORDER — METOCLOPRAMIDE HCL 10 MG PO TABS: 5.0000 mg | ORAL_TABLET | Freq: Three times a day (TID) | ORAL | Status: DC | PRN

## 2011-02-01 MED ORDER — VANCOMYCIN HCL 1000 MG IV SOLR
1500.0000 mg | Freq: Two times a day (BID) | INTRAVENOUS | Status: DC
  Administered 2011-02-02 – 2011-02-03 (×4): 1500 mg via INTRAVENOUS
  Filled 2011-02-01 (×6): qty 1500

## 2011-02-01 MED ORDER — HYDROCODONE-ACETAMINOPHEN 5-325 MG PO TABS
1.0000 | ORAL_TABLET | ORAL | Status: DC | PRN
  Administered 2011-02-03: 2 via ORAL
  Administered 2011-02-03: 1 via ORAL
  Administered 2011-02-05 – 2011-02-06 (×2): 2 via ORAL
  Filled 2011-02-01 (×2): qty 2

## 2011-02-01 NOTE — H&P (Addendum)
[name] is an 55 y.o. male.   Chief Complaint: Left heel ulcer with weeping edema and venous stasis and lymphadenopathy. HPI: Patient has had a chronic history of venous stasis and lymphedema to both lower extremities worse on the left than the right. Patient has developed an ulcer on the left lateral leg. Patient has a large ulcer on the left heel. Patient was attempted with wound care at home elevation and compressive wraps. Patient has been unable to be compliant and has had increased drainage increased redness concern for cellulitis and infection.  No past medical history on file.  No past surgical history on file.  No family history on file. Social History:  does not have a smoking history on file. He does not have any smokeless tobacco history on file. His alcohol and drug histories not on file.  Allergies: Not on File  No current facility-administered medications on file as of 02/01/2011.   No current outpatient prescriptions on file as of 02/01/2011.    No results found for this or any previous visit (from the past 48 hour(s)). No results found.  Review of Systems  Constitutional: Negative.   HENT: Negative.   Eyes: Negative.   Respiratory: Negative.   Cardiovascular: Negative.   Gastrointestinal: Negative.   Genitourinary: Negative.   Musculoskeletal: Negative.   Skin: Positive for rash.  Neurological: Negative.   Endo/Heme/Allergies: Negative.   Psychiatric/Behavioral: Negative.     There were no vitals taken for this visit. Physical Exam lymphedema and venous stasis changes left lower extremity with serous drainage from the left leg and a large left heel decubitus ulcer with serous drainage as well. There is cellulitis and dermatitis to the left lower extremity. Patient is status post revision total knee for infected total knee as well.  Assessment/Plan Assessment ulceration and cellulitis dermatitis weeping edema left lower extremity. Plan we will admit the  patient start him on IV antibiotics vancomycin and Zosyn strict elevation strict nonweightbearing compressive wraps.  Stage IV Pressure Ulcer (through skin & underlying muscle, tendons, and bones  [name] 02/01/2011, 4:33 PM

## 2011-02-01 NOTE — Progress Notes (Signed)
ANTIBIOTIC CONSULT NOTE - INITIAL  Pharmacy Consult for Vancomycin and Zosyn Indication: Wound infection/Cellulitis  No Known Allergies  Patient Measurements: Total Body Weight 164.9 KG Ideal Body Weight 77.6 KG Adjusted Body Weight: 103.8 KG Height 72 inches  Vital Signs: Temp: 98 F (36.7 C) (12/05 1630) Temp src: Oral (12/05 1630) BP: 104/58 mmHg (12/05 1630) Pulse Rate: 101  (12/05 1630) Labs: Routine Chemistry  02/01/2011  Sodium  136 mEq/L  Potassium  5.0 mEq/L  Chloride  106 mEq/L  Carbon Dioxide  20 mEq/L  Glucose  261 mg/dL  BUN  53 mg/dL  Creatinine  1.90 mg/dL  Calcium  8.5 mg/dL  Glucose-Capillary  267   Estimated Creat. Clearance > 136ml/min.  Microbiology: No results found for this or any previous visit (from the past 720 hour(s)).  Medical History: Past Medical History  Diagnosis Date  . Sleep apnea     uses cpap  . Hypothyroidism   . Anemia   . Blood transfusion   . Chronic kidney disease     hx of kidney stones  . Hypertension   . Arthritis     osteoarthritis  . Pneumonia   . Diabetes mellitus     insulin dependent    Medications:  Prescriptions prior to admission  Medication Sig Dispense Refill  . ALPHA LIPOIC ACID PO Take 1 tablet by mouth daily.        . Ascorbic Acid (VITAMIN C PO) Take 1 tablet by mouth daily.        . benazepril (LOTENSIN) 20 MG tablet Take 20 mg by mouth daily.        Marland Kitchen CROMOLYN SODIUM PO Take 1 tablet by mouth daily.        . Cyanocobalamin (VITAMIN B 12 PO) Take 1 tablet by mouth daily.        Marland Kitchen doxycycline (VIBRA-TABS) 100 MG tablet Take 100 mg by mouth 2 (two) times daily.        . ferrous sulfate 325 (65 FE) MG tablet Take 325 mg by mouth 2 (two) times daily.        Marland Kitchen FOLIC ACID PO Take 1 tablet by mouth daily.        Marland Kitchen GARLIC PO Take 1 tablet by mouth daily.        Marland Kitchen glyBURIDE-metformin (GLUCOVANCE) 2.5-500 MG per tablet Take 1 tablet by mouth 2 (two) times daily.        . insulin glargine  (LANTUS) 100 UNIT/ML injection Inject 20 Units into the skin 2 (two) times daily.        Marland Kitchen levofloxacin (LEVAQUIN) 250 MG tablet Take 250 mg by mouth daily.        Marland Kitchen levothyroxine (SYNTHROID, LEVOTHROID) 200 MCG tablet Take 200 mcg by mouth daily.        . metFORMIN (GLUCOPHAGE) 1000 MG tablet Take 1,000 mg by mouth every evening.        . Multiple Vitamins-Minerals (MULTIVITAMINS THER. W/MINERALS) TABS Take 1 tablet by mouth daily.        . nabumetone (RELAFEN) 750 MG tablet Take 750 mg by mouth 2 (two) times daily.        . niacin (NIASPAN) 750 MG CR tablet Take 1,500 mg by mouth at bedtime.        . Omega-3 Fatty Acids (FISH OIL PO) Take 3 tablets by mouth 2 (two) times daily.        . potassium chloride (KLOR-CON) 10 MEQ CR tablet Take 10 mEq  by mouth 3 (three) times daily.        Marland Kitchen PRESCRIPTION MEDICATION Take 1 tablet by mouth daily. Tradjenta (linagliptin) 5mg        . simvastatin (ZOCOR) 10 MG tablet Take 10 mg by mouth at bedtime.        . Tamsulosin HCl (FLOMAX) 0.4 MG CAPS Take 0.4 mg by mouth daily.        . verapamil (VERELAN PM) 180 MG 24 hr capsule Take 180 mg by mouth daily.        Marland Kitchen VITAMIN E PO Take 1 tablet by mouth daily.        Marland Kitchen warfarin (COUMADIN) 3 MG tablet Take 3 mg by mouth daily.         Assessment: 55 yo admitted with Left heel ulcer with weeping edema and venous stasis and lymphadenopathy in addition to mulitple medical problems including diabetes with CBG > 250 representing poor control.  He has a creatinine of 1.9 and has required lower than predicted dosing for him in the past.  He was here in April and received IV Vancomycin with a steady state trough of 21 mcg/ml.  Goal of Therapy:  Trough level of 15-20 mcg/ml  Plan:  1.  Give loading dose of 2 gm. X 1 at 1800 PM and then start 1.5gm. IV every 12 hours. 2.  Monitor renal function to ensure adequate clearance. 3.  Monitor culture data. 4.  F/U s/s levels and adjust as needed.   Rober Minion, PharmD.  MS 02/01/2011,8:30 PM

## 2011-02-02 LAB — DIFFERENTIAL
Eosinophils Absolute: 0.1 10*3/uL (ref 0.0–0.7)
Monocytes Absolute: 1 10*3/uL (ref 0.1–1.0)
Neutro Abs: 16 10*3/uL — ABNORMAL HIGH (ref 1.7–7.7)
WBC Morphology: INCREASED

## 2011-02-02 LAB — PROTIME-INR
INR: 4.15 — ABNORMAL HIGH (ref 0.00–1.49)
Prothrombin Time: 40.7 seconds — ABNORMAL HIGH (ref 11.6–15.2)

## 2011-02-02 LAB — GLUCOSE, CAPILLARY
Glucose-Capillary: 215 mg/dL — ABNORMAL HIGH (ref 70–99)
Glucose-Capillary: 220 mg/dL — ABNORMAL HIGH (ref 70–99)

## 2011-02-02 MED ORDER — SIMVASTATIN 10 MG PO TABS
10.0000 mg | ORAL_TABLET | Freq: Every day | ORAL | Status: DC
  Administered 2011-02-02 – 2011-02-09 (×8): 10 mg via ORAL
  Filled 2011-02-02 (×9): qty 1

## 2011-02-02 MED ORDER — TAMSULOSIN HCL 0.4 MG PO CAPS
0.4000 mg | ORAL_CAPSULE | Freq: Every day | ORAL | Status: DC
  Administered 2011-02-02 – 2011-02-10 (×8): 0.4 mg via ORAL
  Filled 2011-02-02 (×9): qty 1

## 2011-02-02 MED ORDER — VERAPAMIL HCL ER 180 MG PO TBCR
180.0000 mg | EXTENDED_RELEASE_TABLET | Freq: Every day | ORAL | Status: DC
  Administered 2011-02-02 – 2011-02-10 (×9): 180 mg via ORAL
  Filled 2011-02-02 (×9): qty 1

## 2011-02-02 MED ORDER — GLYBURIDE 2.5 MG PO TABS
2.5000 mg | ORAL_TABLET | Freq: Two times a day (BID) | ORAL | Status: DC
  Administered 2011-02-02 – 2011-02-10 (×17): 2.5 mg via ORAL
  Filled 2011-02-02 (×20): qty 1

## 2011-02-02 MED ORDER — POTASSIUM CHLORIDE CRYS ER 10 MEQ PO TBCR
10.0000 meq | EXTENDED_RELEASE_TABLET | Freq: Three times a day (TID) | ORAL | Status: DC
  Administered 2011-02-02 – 2011-02-07 (×16): 10 meq via ORAL
  Filled 2011-02-02 (×18): qty 1

## 2011-02-02 MED ORDER — INSULIN GLARGINE 100 UNIT/ML ~~LOC~~ SOLN
40.0000 [IU] | Freq: Every day | SUBCUTANEOUS | Status: DC
  Administered 2011-02-02 – 2011-02-09 (×8): 40 [IU] via SUBCUTANEOUS
  Filled 2011-02-02 (×2): qty 3

## 2011-02-02 MED ORDER — BENAZEPRIL HCL 20 MG PO TABS
20.0000 mg | ORAL_TABLET | Freq: Every day | ORAL | Status: DC
  Administered 2011-02-02 – 2011-02-10 (×9): 20 mg via ORAL
  Filled 2011-02-02 (×9): qty 1

## 2011-02-02 MED ORDER — WARFARIN SODIUM 3 MG PO TABS
3.0000 mg | ORAL_TABLET | Freq: Every day | ORAL | Status: DC
  Filled 2011-02-02: qty 1

## 2011-02-02 MED ORDER — GLYBURIDE-METFORMIN 2.5-500 MG PO TABS: 1.0000 | ORAL_TABLET | Freq: Two times a day (BID) | ORAL | Status: DC

## 2011-02-02 MED ORDER — LEVOTHYROXINE SODIUM 200 MCG PO TABS
200.0000 ug | ORAL_TABLET | Freq: Every day | ORAL | Status: DC
  Administered 2011-02-02 – 2011-02-10 (×9): 200 ug via ORAL
  Filled 2011-02-02 (×9): qty 1

## 2011-02-02 MED ORDER — METFORMIN HCL 500 MG PO TABS
500.0000 mg | ORAL_TABLET | Freq: Two times a day (BID) | ORAL | Status: DC
  Administered 2011-02-02: 500 mg via ORAL
  Filled 2011-02-02 (×3): qty 1

## 2011-02-02 NOTE — Progress Notes (Signed)
Patient ID: [name], male   DOB: [birth year], 55 y.o.   MRN: [id number] Patient is on hospital day one for cellulitis left lower extremity with venous stasis ulceration and lymphedema. Patient has decubitus ulcer on his left heel. He has weeping edema from the left lower extremity. We will continue with his vancomycin and Zosyn. His home medications weren't reported. Continue with dressing changes to the left lower extremity daily strict elevation of his left leg above his heart at all times. On examination his mornings patient's legs were dependent below his heart with significant swelling for an edema and drainage from the left lower extremity.

## 2011-02-02 NOTE — Progress Notes (Signed)
Arrived to place pt on cpap for the night. Pt has his home machine setup & refuses to wear ours. I told him he couldn't wear his without it being checked out by [facility] first, but he didn't care. He is very adamant that he is going to wear his machine & not ours. Pt will put it on himself when he's ready.  Kathie Dike RRT

## 2011-02-02 NOTE — Progress Notes (Signed)
PHARMACIST - PHYSICIAN COMMUNICATION DR:  Sharol Given CONCERNING:  METFORMIN SAFE ADMINISTRATION POLICY  RECOMMENDATION: Metformin has been placed on DISCONTINUE (rejected order) STATUS and should be reordered only after any of the conditions below are ruled out.  Current safety recommendations include avoiding metformin for a minimum of 48 hours after the patient's exposure to intravenous contrast media.  DESCRIPTION:  The Pharmacy Committee has adopted a policy that restricts the use of metformin in hospitalized patients until all the contraindications to administration have been ruled out. Specific contraindications are: [x]  Serum creatinine ? 1.5 for males []  Serum creatinine ? 1.4 for females []  Shock, acute MI, sepsis, hypoxemia, dehydration []  Planned administration of intravenous iodinated contrast media []  Heart Failure patients with low EF []  Acute or chronic metabolic acidosis (including DKA)  INR is also >4.0 today,  will d/c coumadin that was ordered and follow daily INR  Davonna Belling, PharmD, BCPS Pager [phone]

## 2011-02-02 NOTE — Progress Notes (Signed)
PHARMACIST - PHYSICIAN COMMUNICATION DR:  Sharol Given CONCERNING: Pharmacy Care Issues Regarding Warfarin Labs  RECOMMENDATION (Action Taken): A baseline and daily protime for three days has been ordered to meet the [facility] Patient safety goal and comply with the current [place].   The Pharmacy will defer all warfarin dose order changes and follow up of lab results to the prescriber unless an additional order to initiate a "pharmacy Coumadin consult" is placed.  DESCRIPTION:  While hospitalized, to be in compliance with The [place] Patient Safety Goals, all patients on warfarin must have a baseline and/or current protime prior to the administration of warfarin. Pharmacy has received your order for warfarin without these required laboratory assessments.

## 2011-02-03 LAB — GLUCOSE, CAPILLARY
Glucose-Capillary: 226 mg/dL — ABNORMAL HIGH (ref 70–99)
Glucose-Capillary: 288 mg/dL — ABNORMAL HIGH (ref 70–99)

## 2011-02-03 LAB — CBC
Hemoglobin: 8.1 g/dL — ABNORMAL LOW (ref 13.0–17.0)
MCHC: 30.9 g/dL (ref 30.0–36.0)
RBC: 2.95 MIL/uL — ABNORMAL LOW (ref 4.22–5.81)
WBC: 18.7 10*3/uL — ABNORMAL HIGH (ref 4.0–10.5)

## 2011-02-03 LAB — BASIC METABOLIC PANEL
BUN: 50 mg/dL — ABNORMAL HIGH (ref 6–23)
CO2: 18 mEq/L — ABNORMAL LOW (ref 19–32)
Chloride: 104 mEq/L (ref 96–112)
Creatinine, Ser: 1.64 mg/dL — ABNORMAL HIGH (ref 0.50–1.35)

## 2011-02-03 LAB — PROTIME-INR
INR: 2.67 — ABNORMAL HIGH (ref 0.00–1.49)
Prothrombin Time: 28.9 seconds — ABNORMAL HIGH (ref 11.6–15.2)

## 2011-02-03 MED ORDER — MENTHOL 3 MG MT LOZG
1.0000 | LOZENGE | OROMUCOSAL | Status: DC | PRN
  Filled 2011-02-03 (×6): qty 9

## 2011-02-03 MED ORDER — GUAIFENESIN-DM 100-10 MG/5ML PO SYRP
5.0000 mL | ORAL_SOLUTION | ORAL | Status: DC | PRN
  Administered 2011-02-03 – 2011-02-09 (×15): 5 mL via ORAL
  Filled 2011-02-03 (×15): qty 5

## 2011-02-03 MED ORDER — WARFARIN SODIUM 3 MG PO TABS
3.0000 mg | ORAL_TABLET | Freq: Every day | ORAL | Status: DC
  Administered 2011-02-03 – 2011-02-06 (×4): 3 mg via ORAL
  Filled 2011-02-03 (×5): qty 1

## 2011-02-03 NOTE — Progress Notes (Deleted)
Pt is adamant about wearing his CPAP from home. RRT notified that we need an order to be put in. Pt is currently wearing CPAP for the night.

## 2011-02-03 NOTE — Progress Notes (Signed)
Pt has own CPAP machine from home.  Pt to place own nasal mask before sleep.  1 liter oxygen titrated into home circuit.

## 2011-02-03 NOTE — Clinical Documentation Improvement (Signed)
PRESSURE ULCER DOCUMENTATION CLARIFICATION QUERY  THIS DOCUMENT IS NOT A PERMANENT PART OF THE MEDICAL RECORD  TO RESPOND TO THE THIS QUERY, FOLLOW THE INSTRUCTIONS BELOW:  1. If needed, update documentation for the patient's encounter via the notes activity.  2. Access this query again and click edit on the [employer].  3. After updating, or not, click F2 to complete all highlighted (required) fields concerning your review. Select "additional documentation in the medical record" OR "no additional documentation provided".  4. Click Sign note button.  5. The deficiency will fall out of your InBasket *Please let us know if you are not able to compete this workflow by phone or e-mail (listed below).  Please update your documentation within the medical record to reflect your response to this query.                                                                                    02/03/11  Dear Dr. Jerilynn Mages. [facility] In a better effort to capture your patient's severity of illness, reflect appropriate length of stay and utilization of resources, a review of the patient medical record has revealed the following indicators.    Based on your clinical judgment, please clarify and document in a progress note and/or discharge summary the clinical condition associated with the following supporting information:  Per H+P patient with "large left heel decubitus ulcer " .  Please clarify stage of ulcer if known.  Thank you                        *  PLEASE NOTE IF PRESENT ON ADMISSION  *   Possible Clinical Conditions?   _______Stage  I  Pressure Ulcer   (reddening of the skin) _______Stage  II Pressure Ulcer  (blister open or unopened) _______Stage  III Pressure Ulcer (through all layers skin) ____x___Stage IV Pressure Ulcer   (through skin & underlying  muscle, tendons, and bones) _______Other Condition_____________ _______Cannot Clinically Determine    Supporting Information:  Signs &  Symptoms: "serous drainage"  Noted   Skin Assessment: "large left heel decubitus ulcer"   chronic venous stasis ulcers and lymphedema of both legs  Wound Nurse Assessment (Mandan): not at this time    Treatment: IV zosyn stopped 12/5 currently on po Levaquin 250mg  daily and po doxycycline 100mg   Bid   Reviewed: additional documentation in the medical record  Thank You,  [place]  Clinical Documentation Specialist RN, BSN:  Pager [phone] Health Information Management [place]

## 2011-02-03 NOTE — Progress Notes (Signed)
Patient ID: [name], male   DOB: Apr 05, 1955, 55 y.o.   MRN: [id number] Patient was slow with resolution of the cellulitis edema and drainage to the left lower extremity. Continued daily during the compressive wrap dressing changes daily. Continue IV antibiotics.

## 2011-02-03 NOTE — Progress Notes (Signed)
Patient ID: [name], male   DOB: [birth year], 55 y.o.   MRN: [id number] Patient has a stage III pressure ulcer left heel decubitus ulcer with cellulitis and weeping edema and lymphedema from the left lower extremity

## 2011-02-04 LAB — BASIC METABOLIC PANEL
Chloride: 101 mEq/L (ref 96–112)
GFR calc Af Amer: 51 mL/min — ABNORMAL LOW (ref >90)
GFR calc non Af Amer: 44 mL/min — ABNORMAL LOW (ref >90)
Glucose, Bld: 220 mg/dL — ABNORMAL HIGH (ref 70–99)
Potassium: 4.6 mEq/L (ref 3.5–5.1)
Sodium: 130 mEq/L — ABNORMAL LOW (ref 135–145)

## 2011-02-04 LAB — PROTIME-INR
INR: 2.18 — ABNORMAL HIGH (ref 0.00–1.49)
Prothrombin Time: 24.6 seconds — ABNORMAL HIGH (ref 11.6–15.2)

## 2011-02-04 LAB — CBC
Hemoglobin: 7.8 g/dL — ABNORMAL LOW (ref 13.0–17.0)
RBC: 2.87 MIL/uL — ABNORMAL LOW (ref 4.22–5.81)

## 2011-02-04 LAB — GLUCOSE, CAPILLARY
Glucose-Capillary: 223 mg/dL — ABNORMAL HIGH (ref 70–99)
Glucose-Capillary: 188 mg/dL — ABNORMAL HIGH (ref 70–99)

## 2011-02-04 MED ORDER — VANCOMYCIN HCL 1000 MG IV SOLR
750.0000 mg | Freq: Two times a day (BID) | INTRAVENOUS | Status: DC
  Administered 2011-02-04 – 2011-02-10 (×13): 750 mg via INTRAVENOUS
  Filled 2011-02-04 (×14): qty 750

## 2011-02-04 NOTE — Progress Notes (Signed)
ANTIBIOTIC CONSULT NOTE - INITIAL  Pharmacy Consult for Vancomycin and Zosyn Indication: Wound infection/Cellulitis  No Known Allergies  Patient Measurements: Total Body Weight 164.9 KG Ideal Body Weight 77.6 KG Adjusted Body Weight: 103.8 KG Height 72 inches  Vital Signs: Temp: 98.2 F (36.8 C) (12/08 0607) BP: 126/67 mmHg (12/08 0607) Pulse Rate: 86  (12/08 0607) Labs: Routine Chemistry  02/01/2011  Sodium  136 mEq/L  Potassium  5.0 mEq/L  Chloride  106 mEq/L  Carbon Dioxide  20 mEq/L  Glucose  261 mg/dL  BUN  53 mg/dL  Creatinine  1.90 mg/dL  Calcium  8.5 mg/dL  Glucose-Capillary  267   Estimated Creat. Clearance > 126ml/min.  Microbiology: Recent Results (from the past 720 hour(s))  MRSA PCR SCREENING     Status: Normal   Collection Time   02/02/11  1:47 PM      Component Value Range Status Comment   MRSA by PCR NEGATIVE  NEGATIVE  Final     Medical History: Past Medical History  Diagnosis Date  . Sleep apnea     uses cpap  . Hypothyroidism   . Anemia   . Blood transfusion   . Chronic kidney disease     hx of kidney stones  . Hypertension   . Arthritis     osteoarthritis  . Pneumonia   . Diabetes mellitus     insulin dependent    Medications:  Prescriptions prior to admission  Medication Sig Dispense Refill  . ALPHA LIPOIC ACID PO Take 1 tablet by mouth daily.        . Ascorbic Acid (VITAMIN C PO) Take 1 tablet by mouth daily.        . benazepril (LOTENSIN) 20 MG tablet Take 20 mg by mouth daily.        Marland Kitchen CROMOLYN SODIUM PO Take 1 tablet by mouth daily.        . Cyanocobalamin (VITAMIN B 12 PO) Take 1 tablet by mouth daily.        Marland Kitchen doxycycline (VIBRA-TABS) 100 MG tablet Take 100 mg by mouth 2 (two) times daily.        . ferrous sulfate 325 (65 FE) MG tablet Take 325 mg by mouth 2 (two) times daily.        Marland Kitchen FOLIC ACID PO Take 1 tablet by mouth daily.        Marland Kitchen GARLIC PO Take 1 tablet by mouth daily.        Marland Kitchen glyBURIDE-metformin  (GLUCOVANCE) 2.5-500 MG per tablet Take 1 tablet by mouth 2 (two) times daily.        . insulin glargine (LANTUS) 100 UNIT/ML injection Inject 20 Units into the skin 2 (two) times daily.        Marland Kitchen levofloxacin (LEVAQUIN) 250 MG tablet Take 250 mg by mouth daily.        Marland Kitchen levothyroxine (SYNTHROID, LEVOTHROID) 200 MCG tablet Take 200 mcg by mouth daily.        . metFORMIN (GLUCOPHAGE) 1000 MG tablet Take 1,000 mg by mouth every evening.        . Multiple Vitamins-Minerals (MULTIVITAMINS THER. W/MINERALS) TABS Take 1 tablet by mouth daily.        . nabumetone (RELAFEN) 750 MG tablet Take 750 mg by mouth 2 (two) times daily.        . niacin (NIASPAN) 750 MG CR tablet Take 1,500 mg by mouth at bedtime.       . Omega-3 Fatty Acids (  FISH OIL PO) Take 3 tablets by mouth 2 (two) times daily.        . potassium chloride (KLOR-CON) 10 MEQ CR tablet Take 10 mEq by mouth 3 (three) times daily.        Marland Kitchen PRESCRIPTION MEDICATION Take 1 tablet by mouth daily. Tradjenta (linagliptin) 5mg        . simvastatin (ZOCOR) 10 MG tablet Take 10 mg by mouth at bedtime.        . Tamsulosin HCl (FLOMAX) 0.4 MG CAPS Take 0.4 mg by mouth daily.        . verapamil (VERELAN PM) 180 MG 24 hr capsule Take 180 mg by mouth daily.        Marland Kitchen VITAMIN E PO Take 1 tablet by mouth daily.        Marland Kitchen warfarin (COUMADIN) 3 MG tablet Take 3 mg by mouth daily.         Assessment: 55 yo admitted with Left heel ulcer with weeping edema and venous stasis and lymphadenopathy in addition to mulitple medical problems including diabetes with CBG > 250 representing poor control.  Serum creatinine 1.7<---1.9. Vancomycin trough 27.4 this morning.  Goal of Therapy:  Trough level of 15-20 mcg/ml  Plan:  1.  Decrease vancomycin to 750 mg q12h. 2.  Monitor renal function to ensure adequate clearance. 3.  F/U duration of therapy 4.  Continue piperacillin/tazobactam 3.375 grams q8h.   [place] R. Davius Goudeau, PharmD 02/04/2011,10:08 AM

## 2011-02-04 NOTE — Progress Notes (Signed)
Critical value called from Lab, Vancomycin level 27.4. MD paged, Dr. Ninfa Linden returned call, advised for pharmacy to dose and continue to monitor. No other action needed at this time. [name] Adiel 02/04/2011 0800  0800

## 2011-02-04 NOTE — Progress Notes (Signed)
Patient ID: [name], male   DOB: [birth year], 55 y.o.   MRN: [id number] No acute changes overnight.  WBC still a little high.  Hgb down to 7.8 (chronic anemia). Plan to continue IV antibiotics and transfuse 2 units PRBCs.

## 2011-02-05 LAB — TYPE AND SCREEN: Unit division: 0

## 2011-02-05 LAB — GLUCOSE, CAPILLARY
Glucose-Capillary: 230 mg/dL — ABNORMAL HIGH (ref 70–99)
Glucose-Capillary: 178 mg/dL — ABNORMAL HIGH (ref 70–99)
Glucose-Capillary: 203 mg/dL — ABNORMAL HIGH (ref 70–99)

## 2011-02-05 LAB — CBC
HCT: 27.2 % — ABNORMAL LOW (ref 39.0–52.0)
MCV: 87.5 fL (ref 78.0–100.0)
Platelets: 199 10*3/uL (ref 150–400)
RBC: 3.11 MIL/uL — ABNORMAL LOW (ref 4.22–5.81)
WBC: 15.5 10*3/uL — ABNORMAL HIGH (ref 4.0–10.5)

## 2011-02-05 LAB — BASIC METABOLIC PANEL
CO2: 18 mEq/L — ABNORMAL LOW (ref 19–32)
Calcium: 8.1 mg/dL — ABNORMAL LOW (ref 8.4–10.5)
Chloride: 104 mEq/L (ref 96–112)
Sodium: 132 mEq/L — ABNORMAL LOW (ref 135–145)

## 2011-02-05 MED ORDER — NIACIN ER 500 MG PO CPCR
500.0000 mg | ORAL_CAPSULE | Freq: Every day | ORAL | Status: DC
  Administered 2011-02-05 – 2011-02-09 (×5): 500 mg via ORAL
  Filled 2011-02-05 (×6): qty 1

## 2011-02-05 NOTE — Progress Notes (Signed)
Subjective:     Patient feels a little better today.  Objective: Vital signs in last 24 hours: Temp:  [97.3 F (36.3 C)-100.1 F (37.8 C)] 98.1 F (36.7 C) (12/09 1240) Pulse Rate:  [85-102] 89  (12/09 1240) Resp:  [20-22] 22  (12/09 1240) BP: (98-148)/(41-73) 98/41 mmHg (12/09 1240) SpO2:  [97 %-100 %] 98 % (12/09 1240)  Intake/Output from previous day: 12/08 0701 - 12/09 0700 In: 968.3 [P.O.:325; Blood:643.3] Out: 1000 [Urine:1000] Intake/Output this shift:     Basename 02/05/11 0600 02/04/11 0600 02/03/11 0615  HGB 8.8* 7.8* 8.1*    Basename 02/05/11 0600 02/04/11 0600  WBC 15.5* 16.1*  RBC 3.11* 2.87*  HCT 27.2* 25.2*  PLT 199 190    Basename 02/05/11 0600 02/04/11 0600  NA 132* 130*  K 4.9 4.6  CL 104 101  CO2 18* 19  BUN 45* 47*  CREATININE 1.68* 1.70*  GLUCOSE 205* 220*  CALCIUM 8.1* 8.0*    Basename 02/05/11 0600 02/04/11 0600  LABPT -- --  INR 2.23* 2.18*    Compartment soft, unaboot clean. Foot perfused  Assessment/Plan:    continue wraps and IV abx   Kirston Luty Y 02/05/2011, 1:57 PM

## 2011-02-06 LAB — PROTIME-INR
INR: 2.3 — ABNORMAL HIGH (ref 0.00–1.49)
Prothrombin Time: 25.7 s — ABNORMAL HIGH (ref 11.6–15.2)

## 2011-02-06 LAB — GLUCOSE, CAPILLARY
Glucose-Capillary: 168 mg/dL — ABNORMAL HIGH (ref 70–99)
Glucose-Capillary: 167 mg/dL — ABNORMAL HIGH (ref 70–99)
Glucose-Capillary: 210 mg/dL — ABNORMAL HIGH (ref 70–99)
Glucose-Capillary: 226 mg/dL — ABNORMAL HIGH (ref 70–99)
Glucose-Capillary: 158 mg/dL — ABNORMAL HIGH (ref 70–99)

## 2011-02-06 LAB — BASIC METABOLIC PANEL
BUN: 45 mg/dL — ABNORMAL HIGH (ref 6–23)
CO2: 21 mEq/L (ref 19–32)
Chloride: 102 mEq/L (ref 96–112)
GFR calc Af Amer: 48 mL/min — ABNORMAL LOW (ref >90)
Potassium: 4.9 mEq/L (ref 3.5–5.1)

## 2011-02-06 LAB — CBC
HCT: 26.3 % — ABNORMAL LOW (ref 39.0–52.0)
Hemoglobin: 8.3 g/dL — ABNORMAL LOW (ref 13.0–17.0)
MCH: 27.9 pg (ref 26.0–34.0)
MCHC: 31.6 g/dL (ref 30.0–36.0)
MCV: 88.6 fL (ref 78.0–100.0)
Platelets: 196 10*3/uL (ref 150–400)
RBC: 2.97 MIL/uL — ABNORMAL LOW (ref 4.22–5.81)
RDW: 16.2 % — ABNORMAL HIGH (ref 11.5–15.5)
WBC: 14.8 10*3/uL — ABNORMAL HIGH (ref 4.0–10.5)

## 2011-02-06 LAB — VANCOMYCIN, TROUGH: Vancomycin Tr: 20.3 ug/mL — ABNORMAL HIGH (ref 10.0–20.0)

## 2011-02-06 NOTE — Progress Notes (Signed)
ANTIBIOTIC CONSULT NOTE - FOLLOW UP  Pharmacy Consult for Vancomycin, Zosyn, Coumadin Indication: left heel ulcer; hx DVT/PE  No Known Allergies  Patient Measurements: Height: 6' (182.9 cm) Weight: 363 lb 8.6 oz (164.9 kg) IBW/kg (Calculated) : 77.6  Adjusted Body Weight:   Vital Signs: Temp: 99.3 F (37.4 C) (12/10 0511) Temp src: Oral (12/10 0511) BP: 130/68 mmHg (12/10 0511) Pulse Rate: 83  (12/10 0511) Intake/Output from previous day: 12/09 0701 - 12/10 0700 In: 830 [I.V.:830] Out: -  Intake/Output from this shift: Total I/O In: 360 [P.O.:360] Out: 500 [Urine:500]  Labs:  [facility] 02/06/11 0620 02/05/11 0600 02/04/11 0600  WBC 14.8* 15.5* 16.1*  HGB 8.3* 8.8* 7.8*  PLT 196 199 190  LABCREA -- -- --  CREATININE 1.81* 1.68* 1.70*   Estimated Creatinine Clearance: 75.1 ml/min (by C-G formula based on Cr of 1.81).  Basename 02/04/11 0600  VANCOTROUGH 27.4*  VANCOPEAK --  Jake Michaelis --  GENTTROUGH --  GENTPEAK --  GENTRANDOM --  TOBRATROUGH --  TOBRAPEAK --  TOBRARND --  AMIKACINPEAK --  AMIKACINTROU --  AMIKACIN --     Microbiology: Recent Results (from the past 720 hour(s))  MRSA PCR SCREENING     Status: Normal   Collection Time   02/02/11  1:47 PM      Component Value Range Status Comment   MRSA by PCR NEGATIVE  NEGATIVE  Final     Anti-infectives     Start     Dose/Rate Route Frequency Ordered Stop   02/04/11 1100   vancomycin (VANCOCIN) 750 mg in sodium chloride 0.9 % 150 mL IVPB        750 mg 150 mL/hr over 60 Minutes Intravenous Every 12 hours 02/04/11 1017     02/02/11 0600   vancomycin (VANCOCIN) 1,500 mg in sodium chloride 0.9 % 500 mL IVPB  Status:  Discontinued        1,500 mg 250 mL/hr over 120 Minutes Intravenous Every 12 hours 02/01/11 2010 02/04/11 1016   02/01/11 1800  piperacillin-tazobactam (ZOSYN) IVPB 3.375 g       3.375 g 100 mL/hr over 30 Minutes Intravenous  Once 02/01/11 1734 02/01/11 2056   02/01/11 1800    vancomycin (VANCOCIN) 2,000 mg in sodium chloride 0.9 % 500 mL IVPB        2,000 mg 250 mL/hr over 120 Minutes Intravenous  Once 02/01/11 1739 02/01/11 2216   02/01/11 1800  piperacillin-tazobactam (ZOSYN) IVPB 3.375 g       3.375 g 12.5 mL/hr over 240 Minutes Intravenous 3 times per day 02/01/11 2012            Assessment: Patient is a 56 y.o M on vancomycin and zosyn day #5 for heel ulcer.  Increased necrotic tissues noted at infection site with plan for possible transtibial amputation. Last vancomycin trough obtained on 12/08 was 27.4 with dose subsequently changed from 1500 mg q12h to 750 mg q12h.  Patient scr continue to trend up with 1.81 today. 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  Goal of Therapy:  Vancomycin trough= 15-20; INR 2-3  Plan:  1) Recheck vancomycin trough level with PM dose today to r/o accummulation. 2) No change for zosyn 3) Continue Coumadin 3 mg daily for now 4)  Please advise [place] when you want to d/c Coumadin if surgery is planned for patient   Claudetta Sallie P 02/06/2011,11:25 AM

## 2011-02-06 NOTE — Progress Notes (Signed)
Patient ID: [name], male   DOB: Nov 01, 1955, 55 y.o.   MRN: [id number] Patient's dressing is taken down at this time. He has decreased edema in the left lower extremity still. Patient is brawny skin color changes but no cellulitis. The heel shows increased necrotic tissue with ulceration which probes to bone. Assessment Wagner grade 3 necrotic ulcer left heel. Plan do to the increasing necrotic soft tissue envelope of the heel. Patient will require a transtibial amputation. I will discuss this with him later today. He was using his CPAP machine and was too sleepy to understand the worsening findings this morning.

## 2011-02-06 NOTE — Progress Notes (Signed)
Patient ID: [name], male   DOB: [birth year], 55 y.o.   MRN: [id number] I discussed with the patient and his wife the worsening of his foot with necrotic tissue exposed bone, despite the improved venous edema of his leg, plan for bka tomorrow

## 2011-02-07 ENCOUNTER — Encounter (HOSPITAL_COMMUNITY): Payer: Self-pay | Admitting: Anesthesiology

## 2011-02-07 ENCOUNTER — Other Ambulatory Visit: Payer: Self-pay | Admitting: Orthopedic Surgery

## 2011-02-07 ENCOUNTER — Inpatient Hospital Stay (HOSPITAL_COMMUNITY): Payer: Medicare Other | Admitting: Anesthesiology

## 2011-02-07 ENCOUNTER — Encounter (HOSPITAL_COMMUNITY)
Admission: AD | Disposition: A | Discharge status: Skilled Nursing Facility | Payer: Self-pay | Source: Ambulatory Visit | Attending: Orthopedic Surgery

## 2011-02-07 HISTORY — PX: AMPUTATION: SHX166

## 2011-02-07 LAB — PROTIME-INR
INR: 2.62 — ABNORMAL HIGH (ref 0.00–1.49)
Prothrombin Time: 28.4 seconds — ABNORMAL HIGH (ref 11.6–15.2)

## 2011-02-07 LAB — CBC
Platelets: 207 10*3/uL (ref 150–400)
RDW: 15.9 % — ABNORMAL HIGH (ref 11.5–15.5)
WBC: 14.1 10*3/uL — ABNORMAL HIGH (ref 4.0–10.5)

## 2011-02-07 LAB — GLUCOSE, CAPILLARY
Glucose-Capillary: 167 mg/dL — ABNORMAL HIGH (ref 70–99)
Glucose-Capillary: 217 mg/dL — ABNORMAL HIGH (ref 70–99)

## 2011-02-07 LAB — BASIC METABOLIC PANEL
Calcium: 8.1 mg/dL — ABNORMAL LOW (ref 8.4–10.5)
Creatinine, Ser: 1.62 mg/dL — ABNORMAL HIGH (ref 0.50–1.35)
GFR calc Af Amer: 54 mL/min — ABNORMAL LOW (ref >90)
GFR calc non Af Amer: 47 mL/min — ABNORMAL LOW (ref >90)

## 2011-02-07 SURGERY — AMPUTATION BELOW KNEE
Anesthesia: General | Site: Leg Lower | Laterality: Left | Wound class: Dirty or Infected

## 2011-02-07 MED ORDER — SUCCINYLCHOLINE CHLORIDE 20 MG/ML IJ SOLN
INTRAMUSCULAR | Status: DC | PRN
  Administered 2011-02-07: 140 mg via INTRAVENOUS

## 2011-02-07 MED ORDER — ONDANSETRON HCL 4 MG PO TABS: 4.0000 mg | ORAL_TABLET | Freq: Four times a day (QID) | ORAL | Status: DC | PRN

## 2011-02-07 MED ORDER — LACTATED RINGERS IV SOLN
INTRAVENOUS | Status: DC | PRN
  Administered 2011-02-07 (×2): via INTRAVENOUS

## 2011-02-07 MED ORDER — MIDAZOLAM HCL 5 MG/5ML IJ SOLN
INTRAMUSCULAR | Status: DC | PRN
  Administered 2011-02-07: 2 mg via INTRAVENOUS

## 2011-02-07 MED ORDER — METOCLOPRAMIDE HCL 5 MG/ML IJ SOLN
5.0000 mg | Freq: Three times a day (TID) | INTRAMUSCULAR | Status: DC | PRN
  Filled 2011-02-07: qty 2

## 2011-02-07 MED ORDER — HYDROCODONE-ACETAMINOPHEN 5-325 MG PO TABS
1.0000 | ORAL_TABLET | ORAL | Status: DC | PRN
  Filled 2011-02-07: qty 2

## 2011-02-07 MED ORDER — HYDROMORPHONE HCL PF 1 MG/ML IJ SOLN
0.5000 mg | INTRAMUSCULAR | Status: DC | PRN
  Administered 2011-02-08 – 2011-02-10 (×10): 1 mg via INTRAVENOUS
  Filled 2011-02-07 (×11): qty 1

## 2011-02-07 MED ORDER — WARFARIN SODIUM 3 MG PO TABS
3.0000 mg | ORAL_TABLET | Freq: Every day | ORAL | Status: DC
  Administered 2011-02-07: 3 mg via ORAL
  Filled 2011-02-07 (×2): qty 1

## 2011-02-07 MED ORDER — SODIUM CHLORIDE 0.9 % IR SOLN
Status: DC | PRN
  Administered 2011-02-07: 1000 mL

## 2011-02-07 MED ORDER — HYDROMORPHONE HCL PF 1 MG/ML IJ SOLN
INTRAMUSCULAR | Status: AC
  Filled 2011-02-07: qty 1

## 2011-02-07 MED ORDER — PROPOFOL 10 MG/ML IV EMUL
INTRAVENOUS | Status: DC | PRN
  Administered 2011-02-07: 200 mg via INTRAVENOUS

## 2011-02-07 MED ORDER — METHOCARBAMOL 100 MG/ML IJ SOLN
500.0000 mg | Freq: Four times a day (QID) | INTRAVENOUS | Status: DC | PRN
  Administered 2011-02-07: 500 mg via INTRAVENOUS
  Filled 2011-02-07: qty 5

## 2011-02-07 MED ORDER — HYDROMORPHONE HCL PF 1 MG/ML IJ SOLN
0.2500 mg | INTRAMUSCULAR | Status: DC | PRN
  Administered 2011-02-07 (×3): 0.5 mg via INTRAVENOUS

## 2011-02-07 MED ORDER — ONDANSETRON HCL 4 MG/2ML IJ SOLN
INTRAMUSCULAR | Status: DC | PRN
  Administered 2011-02-07: 4 mg via INTRAVENOUS

## 2011-02-07 MED ORDER — FENTANYL CITRATE 0.05 MG/ML IJ SOLN
INTRAMUSCULAR | Status: DC | PRN
  Administered 2011-02-07 (×2): 50 ug via INTRAVENOUS
  Administered 2011-02-07: 150 ug via INTRAVENOUS

## 2011-02-07 MED ORDER — ACETAMINOPHEN 10 MG/ML IV SOLN
INTRAVENOUS | Status: AC
  Filled 2011-02-07: qty 100

## 2011-02-07 MED ORDER — METOCLOPRAMIDE HCL 10 MG PO TABS: 5.0000 mg | ORAL_TABLET | Freq: Three times a day (TID) | ORAL | Status: DC | PRN

## 2011-02-07 MED ORDER — MEPERIDINE HCL 25 MG/ML IJ SOLN: 6.2500 mg | INTRAMUSCULAR | Status: DC | PRN

## 2011-02-07 MED ORDER — CLINDAMYCIN PHOSPHATE 600 MG/50ML IV SOLN
600.0000 mg | INTRAVENOUS | Status: AC
  Administered 2011-02-07: 600 mg via INTRAVENOUS
  Filled 2011-02-07: qty 50

## 2011-02-07 MED ORDER — ONDANSETRON HCL 4 MG/2ML IJ SOLN: 4.0000 mg | Freq: Four times a day (QID) | INTRAMUSCULAR | Status: DC | PRN

## 2011-02-07 MED ORDER — OXYCODONE-ACETAMINOPHEN 5-325 MG PO TABS
1.0000 | ORAL_TABLET | ORAL | Status: DC | PRN
  Administered 2011-02-07: 2 via ORAL
  Filled 2011-02-07: qty 2

## 2011-02-07 MED ORDER — FERROUS SULFATE 325 (65 FE) MG PO TABS
325.0000 mg | ORAL_TABLET | Freq: Three times a day (TID) | ORAL | Status: DC
  Administered 2011-02-08 – 2011-02-10 (×9): 325 mg via ORAL
  Filled 2011-02-07 (×10): qty 1

## 2011-02-07 MED ORDER — METHOCARBAMOL 500 MG PO TABS
500.0000 mg | ORAL_TABLET | Freq: Four times a day (QID) | ORAL | Status: DC | PRN
  Administered 2011-02-08 – 2011-02-10 (×4): 500 mg via ORAL
  Filled 2011-02-07 (×4): qty 1

## 2011-02-07 MED ORDER — PROMETHAZINE HCL 25 MG/ML IJ SOLN: 6.2500 mg | INTRAMUSCULAR | Status: DC | PRN

## 2011-02-07 MED ORDER — CHLORHEXIDINE GLUCONATE 4 % EX LIQD
60.0000 mL | Freq: Once | CUTANEOUS | Status: DC
  Filled 2011-02-07: qty 60

## 2011-02-07 MED ORDER — ACETAMINOPHEN 10 MG/ML IV SOLN
INTRAVENOUS | Status: DC | PRN
  Administered 2011-02-07: 1000 mg via INTRAVENOUS

## 2011-02-07 MED ORDER — LACTATED RINGERS IV SOLN
INTRAVENOUS | Status: DC
  Administered 2011-02-07: 16:00:00 via INTRAVENOUS

## 2011-02-07 SURGICAL SUPPLY — 45 items
BANDAGE ESMARK 6X9 LF (GAUZE/BANDAGES/DRESSINGS) ×1 IMPLANT
BANDAGE GAUZE ELAST BULKY 4 IN (GAUZE/BANDAGES/DRESSINGS) ×4 IMPLANT
BLADE SAW RECIP 87.9 MT (BLADE) ×2 IMPLANT
BLADE SURG 21 STRL SS (BLADE) ×2 IMPLANT
BNDG CMPR 9X6 STRL LF SNTH (GAUZE/BANDAGES/DRESSINGS) ×1
BNDG COHESIVE 6X5 TAN STRL LF (GAUZE/BANDAGES/DRESSINGS) ×4 IMPLANT
BNDG ESMARK 6X9 LF (GAUZE/BANDAGES/DRESSINGS) ×2
CLOTH BEACON ORANGE TIMEOUT ST (SAFETY) ×2 IMPLANT
COVER SURGICAL LIGHT HANDLE (MISCELLANEOUS) ×2 IMPLANT
CUFF TOURNIQUET SINGLE 34IN LL (TOURNIQUET CUFF) IMPLANT
CUFF TOURNIQUET SINGLE 44IN (TOURNIQUET CUFF) ×1 IMPLANT
DRAIN PENROSE 1/2X12 LTX STRL (WOUND CARE) IMPLANT
DRAPE EXTREMITY T 121X128X90 (DRAPE) ×2 IMPLANT
DRAPE PROXIMA HALF (DRAPES) ×4 IMPLANT
DRAPE U-SHAPE 47X51 STRL (DRAPES) ×4 IMPLANT
DRSG ADAPTIC 3X8 NADH LF (GAUZE/BANDAGES/DRESSINGS) ×2 IMPLANT
DRSG PAD ABDOMINAL 8X10 ST (GAUZE/BANDAGES/DRESSINGS) ×2 IMPLANT
DURAPREP 26ML APPLICATOR (WOUND CARE) ×2 IMPLANT
ELECT REM PT RETURN 9FT ADLT (ELECTROSURGICAL) ×2
ELECTRODE REM PT RTRN 9FT ADLT (ELECTROSURGICAL) ×1 IMPLANT
EVACUATOR 1/8 PVC DRAIN (DRAIN) IMPLANT
GLOVE BIOGEL PI IND STRL 9 (GLOVE) ×1 IMPLANT
GLOVE BIOGEL PI INDICATOR 9 (GLOVE) ×1
GLOVE SURG ORTHO 9.0 STRL STRW (GLOVE) ×2 IMPLANT
GOWN PREVENTION PLUS XLARGE (GOWN DISPOSABLE) ×2 IMPLANT
GOWN SRG XL XLNG 56XLVL 4 (GOWN DISPOSABLE) ×1 IMPLANT
GOWN STRL NON-REIN XL XLG LVL4 (GOWN DISPOSABLE) ×2
KIT BASIN OR (CUSTOM PROCEDURE TRAY) ×2 IMPLANT
KIT ROOM TURNOVER OR (KITS) ×2 IMPLANT
MANIFOLD NEPTUNE II (INSTRUMENTS) ×2 IMPLANT
NS IRRIG 1000ML POUR BTL (IV SOLUTION) ×2 IMPLANT
PACK GENERAL/GYN (CUSTOM PROCEDURE TRAY) ×2 IMPLANT
PAD ARMBOARD 7.5X6 YLW CONV (MISCELLANEOUS) ×4 IMPLANT
SPONGE GAUZE 4X4 12PLY (GAUZE/BANDAGES/DRESSINGS) ×2 IMPLANT
SPONGE LAP 18X18 X RAY DECT (DISPOSABLE) IMPLANT
STAPLER VISISTAT 35W (STAPLE) IMPLANT
STOCKINETTE IMPERVIOUS LG (DRAPES) ×2 IMPLANT
SUT PDS AB 1 CT  36 (SUTURE)
SUT PDS AB 1 CT 36 (SUTURE) IMPLANT
SUT SILK 2 0 (SUTURE) ×2
SUT SILK 2-0 18XBRD TIE 12 (SUTURE) ×1 IMPLANT
TOWEL OR 17X24 6PK STRL BLUE (TOWEL DISPOSABLE) ×2 IMPLANT
TOWEL OR 17X26 10 PK STRL BLUE (TOWEL DISPOSABLE) ×2 IMPLANT
TUBE ANAEROBIC SPECIMEN COL (MISCELLANEOUS) IMPLANT
WATER STERILE IRR 1000ML POUR (IV SOLUTION) ×2 IMPLANT

## 2011-02-07 NOTE — Progress Notes (Signed)
ANTICOAGULATION CONSULT NOTE - Follow Up Consult  Pharmacy Consult for Coumadin Indication: hx DVT/PE  No Known Allergies  Patient Measurements: Height: 6' (182.9 cm) Weight: 363 lb 8.6 oz (164.9 kg) IBW/kg (Calculated) : 77.6  Adjusted Body Weight:  Vital Signs: Temp: 98.9 F (37.2 C) (12/11 0530) Temp src: Oral (12/10 2156) BP: 140/77 mmHg (12/11 0530) Pulse Rate: 81  (12/11 0530)  Labs:  Basename 02/07/11 0505 02/06/11 0620 02/05/11 0600  HGB 8.6* 8.3* --  HCT 27.6* 26.3* 27.2*  PLT 207 196 199  APTT -- -- --  LABPROT 28.4* 25.7* 25.1*  INR 2.62* 2.30* 2.23*  HEPARINUNFRC -- -- --  CREATININE 1.62* 1.81* 1.68*  CKTOTAL -- -- --  CKMB -- -- --  TROPONINI -- -- --   Estimated Creatinine Clearance: 83.9 ml/min (by C-G formula based on Cr of 1.62).   Assessment: Patient is a 55 y.o. M on Coumadin for hx DVT/PE with plan for BKA today.  RN stated that Dr. Sharol Given is aware of pt's INR value. Consider reversing Coumadin with Vit K or FFP if to take patient to procedure today.  Goal of Therapy:  INR 2-3   Plan:  1) Will hold Coumadin for now until further order to resume after surgical procedure 2)  Please notify pharmacy if/when we can resume Coumadin for patient after BKA  Adonai Helzer P 02/07/2011,9:52 AM

## 2011-02-07 NOTE — Interval H&P Note (Signed)
History and Physical Interval Note:  02/07/2011 3:48 PM  Gareth Eagle  has presented today for surgery, with the diagnosis of Left Foot Gangrene and Osteomyelitis  The various methods of treatment have been discussed with the patient and family. After consideration of risks, benefits and other options for treatment, the patient has consented to  Procedure(s): AMPUTATION BELOW KNEE as a surgical intervention .  The patients' history has been reviewed, patient examined, no change in status, stable for surgery.  I have reviewed the patients' chart and labs.  Questions were answered to the patient's satisfaction.     DUDA,MARCUS V

## 2011-02-07 NOTE — Anesthesia Procedure Notes (Signed)
Procedure Name: Intubation Date/Time: 02/07/2011 4:32 PM Performed by: Sabra Heck Pre-anesthesia Checklist: Patient identified, Timeout performed, Emergency Drugs available, Suction available and Patient being monitored Patient Re-evaluated:Patient Re-evaluated prior to inductionOxygen Delivery Method: Circle System Utilized Preoxygenation: Pre-oxygenation with 100% oxygen Intubation Type: IV induction Ventilation: Mask ventilation without difficulty and Oral airway inserted - appropriate to patient size Laryngoscope size: GLIDESCOPE  Grade View: Grade I Tube type: Oral Tube size: 8.0 mm Number of attempts: 1 Airway Equipment and Method: stylet and video-laryngoscopy Placement Confirmation: ETT inserted through vocal cords under direct vision,  breath sounds checked- equal and bilateral and positive ETCO2 Secured at: 22 cm Tube secured with: Tape Dental Injury: Teeth and Oropharynx as per pre-operative assessment

## 2011-02-07 NOTE — H&P (View-Only) (Signed)
Patient ID: [name], male   DOB: 1955-08-25, 55 y.o.   MRN: [id number] I discussed with the patient and his wife the worsening of his foot with necrotic tissue exposed bone, despite the improved venous edema of his leg, plan for bka tomorrow

## 2011-02-07 NOTE — Preoperative (Signed)
Beta Blockers   Reason not to administer Beta Blockers:Pt does not take B Blockers

## 2011-02-07 NOTE — Progress Notes (Signed)
ANTIBIOTIC CONSULT NOTE - FOLLOW UP  Pharmacy Consult for vancomycin Indication: pending BKA  No Known Allergies  Patient Measurements: Height: 6' (182.9 cm) Weight: 363 lb 8.6 oz (164.9 kg) IBW/kg (Calculated) : 77.6   Vital Signs: Temp: 98.1 F (36.7 C) (12/10 2156) Temp src: Oral (12/10 2156) BP: 132/76 mmHg (12/10 2156) Pulse Rate: 88  (12/10 2156) Intake/Output from previous day: 12/10 0701 - 12/11 0700 In: 720 [P.O.:720] Out: 1300 [Urine:1300] Intake/Output from this shift: Total I/O In: -  Out: 350 [Urine:350]  Labs:  [facility] 02/06/11 0620 02/05/11 0600 02/04/11 0600  WBC 14.8* 15.5* 16.1*  HGB 8.3* 8.8* 7.8*  PLT 196 199 190  LABCREA -- -- --  CREATININE 1.81* 1.68* 1.70*   Estimated Creatinine Clearance: 75.1 ml/min (by C-G formula based on Cr of 1.81).  Basename 02/06/11 2230 02/04/11 0600  VANCOTROUGH 20.3* 27.4*  VANCOPEAK -- --  Jake Michaelis -- --  GENTTROUGH -- --  GENTPEAK -- --  GENTRANDOM -- --  TOBRATROUGH -- --  TOBRAPEAK -- --  TOBRARND -- --  AMIKACINPEAK -- --  AMIKACINTROU -- --  AMIKACIN -- --     Microbiology: Recent Results (from the past 720 hour(s))  MRSA PCR SCREENING     Status: Normal   Collection Time   02/02/11  1:47 PM      Component Value Range Status Comment   MRSA by PCR NEGATIVE  NEGATIVE  Final     Anti-infectives     Start     Dose/Rate Route Frequency Ordered Stop   02/04/11 1100   vancomycin (VANCOCIN) 750 mg in sodium chloride 0.9 % 150 mL IVPB        750 mg 150 mL/hr over 60 Minutes Intravenous Every 12 hours 02/04/11 1017     02/02/11 0600   vancomycin (VANCOCIN) 1,500 mg in sodium chloride 0.9 % 500 mL IVPB  Status:  Discontinued        1,500 mg 250 mL/hr over 120 Minutes Intravenous Every 12 hours 02/01/11 2010 02/04/11 1016   02/01/11 1800  piperacillin-tazobactam (ZOSYN) IVPB 3.375 g       3.375 g 100 mL/hr over 30 Minutes Intravenous  Once 02/01/11 1734 02/01/11 2056   02/01/11 1800    vancomycin (VANCOCIN) 2,000 mg in sodium chloride 0.9 % 500 mL IVPB        2,000 mg 250 mL/hr over 120 Minutes Intravenous  Once 02/01/11 1739 02/01/11 2216   02/01/11 1800  piperacillin-tazobactam (ZOSYN) IVPB 3.375 g       3.375 g 12.5 mL/hr over 240 Minutes Intravenous 3 times per day 02/01/11 2012            Assessment: 55yo male with vanc trough 20.3 30 min prior to dose, would expect to be in goal range by actual trough.  BKA planned.  Goal of Therapy:  Vancomycin trough level 15-20 mcg/ml  Plan:  Will continue vancomycin 750mg  Q12H and continue to follow.  Rogue Bussing PharmD BCPS 02/07/2011,12:14 AM

## 2011-02-07 NOTE — Progress Notes (Signed)
02/07/2011 Orders received to continue Warfarin post op.  Will resume previous dose of 3mg  daily and check daily protimes. Candie Mile

## 2011-02-07 NOTE — Anesthesia Preprocedure Evaluation (Signed)
Anesthesia Evaluation  Patient identified by MRN, date of birth, ID band Patient awake    Reviewed: Allergy & Precautions, H&P , NPO status , Patient's Chart, lab work & pertinent test results  Airway Mallampati: III TM Distance: >3 FB Neck ROM: Full    Dental  (+) Teeth Intact, Chipped and Dental Advisory Given   Pulmonary sleep apnea and Continuous Positive Airway Pressure Ventilation ,  clear to auscultation        Cardiovascular hypertension, Pt. on medications Regular Normal    Neuro/Psych    GI/Hepatic Neg liver ROS,   Endo/Other  Diabetes mellitus-, Well Controlled, Type 1, Insulin Dependent and Oral Hypoglycemic AgentsHypothyroidism Morbid obesity  Renal/GU Renal InsufficiencyRenal disease     Musculoskeletal   Abdominal (+) obese,   Peds  Hematology   Anesthesia Other Findings   Reproductive/Obstetrics                           Anesthesia Physical Anesthesia Plan  ASA: III  Anesthesia Plan: General   Post-op Pain Management:    Induction: Intravenous  Airway Management Planned: Oral ETT and Video Laryngoscope Planned  Additional Equipment:   Intra-op Plan:   Post-operative Plan: Extubation in OR  Informed Consent: I have reviewed the patients History and Physical, chart, labs and discussed the procedure including the risks, benefits and alternatives for the proposed anesthesia with the patient or authorized representative who has indicated his/her understanding and acceptance.   Dental advisory given  Plan Discussed with: CRNA  Anesthesia Plan Comments:         Anesthesia Quick Evaluation

## 2011-02-07 NOTE — Progress Notes (Signed)
Patient ID: [name], male   DOB: Aug 14, 1955, 55 y.o.   MRN: [id number] Plan for left BKA today at 2:30

## 2011-02-07 NOTE — Progress Notes (Signed)
Utilization review completed. Rozanna Boer, RN, BSN. 02/07/11

## 2011-02-07 NOTE — Anesthesia Postprocedure Evaluation (Signed)
  Anesthesia Post-op Note  Patient: [name]  Procedure(s) Performed:  AMPUTATION BELOW KNEE - Left Below Knee Amputation  Patient Location: PACU  Anesthesia Type: General  Level of Consciousness: awake  Airway and Oxygen Therapy: Patient Spontanous Breathing and Patient connected to nasal cannula oxygen  Post-op Pain: moderate  Post-op Assessment: Post-op Vital signs reviewed, Patient's Cardiovascular Status Stable, Respiratory Function Stable and Patent Airway  Post-op Vital Signs: Reviewed and stable  Complications: No apparent anesthesia complications

## 2011-02-07 NOTE — Transfer of Care (Signed)
Immediate Anesthesia Transfer of Care Note  Patient: [name]  Procedure(s) Performed:  AMPUTATION BELOW KNEE - Left Below Knee Amputation  Patient Location: PACU  Anesthesia Type: General  Level of Consciousness: awake, alert  and oriented  Airway & Oxygen Therapy: Patient Spontanous Breathing and Patient connected to nasal cannula oxygen  Post-op Assessment: Report given to PACU RN and Post -op Vital signs reviewed and stable  Post vital signs: Reviewed and stable  Complications: No apparent anesthesia complications

## 2011-02-07 NOTE — Op Note (Signed)
OPERATIVE REPORT  DATE OF SURGERY: 02/07/2011  PATIENT:  [name],  55 y.o. male  PRE-OPERATIVE DIAGNOSIS:  Left Foot Gangrene and Osteomyelitis  POST-OPERATIVE DIAGNOSIS:  Left Foot Gangrene and Osteomyelitis  PROCEDURE:  Procedure(s): AMPUTATION BELOW KNEE  SURGEON:  Surgeon(s): Newt Minion, MD  ANESTHESIA:   general  EBL:  200 ML  SPECIMEN:  Source of Specimen:  Left leg  TOURNIQUET:  Approximately 7 minutes PROCEDURE DETAILS: Patient is a 55 year old gentleman with type 2 diabetes uncontrolled lymphedema venous stasis insufficiency the [place] 50 with progressive gangrenous necrotic changes to his left foot. He is undergone IV antibiotics compressive wraps to decrease the edema in the leg. The edema in the leg has resolved significantly however patient has an increasing mass of necrotic changes to the forefoot hindfoot with foul-smelling drainage and necrotic tissue. Due to failure conservative care patient presents at this time for transtibial amputation. Risks and benefits were discussed including infection neurovascular injury nonhealing of the wound need for higher level amputation patient states he understands was to proceed at this time. Description of procedure patient brought to OR room tendon underwent a general anesthetic. After adequate levels of anesthesia were obtained patient's left lower extremity was prepped using DuraPrep and draped into a sterile field with the foot draped out with an impervious drape. A transverse incision was made 11 cm distal to the tibial tubercle a lower this curved proximally and a large posterior flap was created. The tibia was transected 1 cm proximal to the skin incision the fibula was transected just proximal to the tibial incision. There is approximately 1/4 inch of patient's a total knee arthroplasty protruding from the bone. This was left intact. The vascular bundles were suture ligated with 2-0 silk the sciatic nerve was pulled cut  and allowed to retract the tourniquet was deflated after possibly 7 minutes hemostasis was obtained. The deep and superficial fascial layers were closed using #1 PDS the skin was closed using 2-0 nylon approximate staples. The wound was covered with Adaptic orthopedic sponges AB dressing Kerlix and Coban. Patient was extubated taken the PACU in stable condition anticipate discharge to skilled nursing.  PLAN OF CARE: Admit to inpatient   PATIENT DISPOSITION:  PACU - hemodynamically stable.   Newt Minion, MD 02/07/2011 5:40 PM

## 2011-02-08 LAB — GLUCOSE, CAPILLARY: Glucose-Capillary: 168 mg/dL — ABNORMAL HIGH (ref 70–99)

## 2011-02-08 LAB — BASIC METABOLIC PANEL
BUN: 35 mg/dL — ABNORMAL HIGH (ref 6–23)
Chloride: 105 mEq/L (ref 96–112)
Creatinine, Ser: 1.41 mg/dL — ABNORMAL HIGH (ref 0.50–1.35)
GFR calc Af Amer: 64 mL/min — ABNORMAL LOW (ref >90)

## 2011-02-08 LAB — CBC
HCT: 27.4 % — ABNORMAL LOW (ref 39.0–52.0)
MCHC: 31 g/dL (ref 30.0–36.0)
MCV: 90.4 fL (ref 78.0–100.0)
RDW: 15.7 % — ABNORMAL HIGH (ref 11.5–15.5)

## 2011-02-08 NOTE — Progress Notes (Signed)
Physical Therapy Treatment Patient Details Name: [name] MRN: [id number] DOB: Feb 28, 1955 Today's Date: 02/08/2011  PT Assessment/Plan  PT - Assessment/Plan Comments on Treatment Session: Pt did not feel that he could perform another SPT and was also fearful of using slideboard but did so with max encouragement.  Continue to follow pt to work on mobility. PT Plan: Discharge plan remains appropriate PT Frequency: Min 3X/week Follow Up Recommendations: Skilled nursing facility;24 hour supervision/assistance Equipment Recommended: Rolling walker with 5" wheels;Other (comment) PT Goals  Acute Rehab PT Goals PT Goal Formulation: With patient Time For Goal Achievement: 2 weeks Pt will Roll Supine to Right Side: with min assist PT Goal: Rolling Supine to Right Side - Progress: Progressing toward goal Pt will Roll Supine to Left Side: with min assist PT Goal: Rolling Supine to Left Side - Progress: Progressing toward goal Pt will go Supine/Side to Sit: with min assist;with HOB 0 degrees;with rail PT Goal: Supine/Side to Sit - Progress:  (NT) Pt will go Sit to Supine/Side: with min assist;with HOB 0 degrees PT Goal: Sit to Supine/Side - Progress: Progressing toward goal Pt will go Sit to Stand: with +2 total assist;Other (comment) PT Goal: Sit to Stand - Progress: Other (comment) (NT) Pt will go Stand to Sit: with min assist PT Goal: Stand to Sit - Progress:  (NT) Pt will Transfer Bed to Chair/Chair to Bed: with +2 total assist PT Transfer Goal: Bed to Chair/Chair to Bed - Progress: Progressing toward goal Pt will Perform Home Exercise Program: with supervision, verbal cues required/provided PT Goal: Perform Home Exercise Program - Progress: Other (comment) (NT)  PT Treatment Precautions/Restrictions  Precautions Precautions: Fall Required Braces or Orthoses: No Restrictions Weight Bearing Restrictions: Yes LLE Weight Bearing: Non weight bearing Other Position/Activity  Restrictions: BKA positioning Mobility (including Balance) Bed Mobility Bed Mobility: Yes Rolling Right: 1: +2 Total assist;Patient percentage (comment) (70%) Rolling Right Details (indicate cue type and reason): vc's given to reach for rail Sit to Supine - Right: 1: +2 Total assist;Patient percentage (comment);HOB flat (10%) Sit to Supine - Right Details (indicate cue type and reason): pt having difficulty scooting back on bed, +3 total assist given at upper body and legs to get pt to supine position Transfers Transfers: Yes Sit to Stand: Other (comment) (attempted 2x with +3 assist but pt unable and scared) Lateral/Scoot Transfers: With slide board;1: +2 Total assist;Patient percentage (comment) (20%) Lateral/Scoot Transfer Details (indicate cue type and reason): pt still fearful of scooting across slideboard but did assist with max encouragement.  Is limited by arm strength and ability to reach to board at his sides due to body girth Ambulation/Gait Ambulation/Gait: No Stairs: No Wheelchair Mobility Wheelchair Mobility: No  Posture/Postural Control Posture/Postural Control: Postural limitations Postural Limitations: decreased trunk control due to abdominal girth Balance Balance Assessed: No  End of Session PT - End of Session Equipment Utilized During Treatment: Gait belt Activity Tolerance: Treatment limited secondary to agitation Patient left: in bed;with call bell in reach Nurse Communication: Mobility status for transfers General Behavior During Session: Other (comment) (still very fearful and discouraged during mobility) Cognition: [facility] for tasks performed  [place], [place] 02/08/2011, 2:36 PM

## 2011-02-08 NOTE — Progress Notes (Signed)
Subjective: 1 Day Post-Op Procedure(s) (LRB): AMPUTATION BELOW KNEE (Left) Complains of pain this morning.   Objective: Vital signs in last 24 hours: Temp:  [97.4 F (36.3 C)-98.4 F (36.9 C)] 98 F (36.7 C) (12/12 0601) Pulse Rate:  [65-105] 76  (12/12 0601) Resp:  [8-28] 18  (12/12 0601) BP: (105-132)/(51-68) 115/63 mmHg (12/12 0601) SpO2:  [86 %-100 %] 98 % (12/12 0601)  Intake/Output from previous day: 12/11 0701 - 12/12 0700 In: 1860 [P.O.:360; I.[name].:1500] Out: 1900 [Urine:1700; Blood:200] Intake/Output this shift: Total I/O In: 360 [P.O.:360] Out: 1575 [Urine:1575]   Basename 02/07/11 0505 02/06/11 0620  HGB 8.6* 8.3*    Basename 02/07/11 0505 02/06/11 0620  WBC 14.1* 14.8*  RBC 3.10* 2.97*  HCT 27.6* 26.3*  PLT 207 196    Basename 02/07/11 0505 02/06/11 0620  NA 131* 130*  K 5.2* 4.9  CL 104 102  CO2 21 21  BUN 40* 45*  CREATININE 1.62* 1.81*  GLUCOSE 198* 160*  CALCIUM 8.1* 8.3*    Basename 02/07/11 0505 02/06/11 0620  LABPT -- --  INR 2.62* 2.30*    No cellulitis present  Assessment/Plan: 1 Day Post-Op Procedure(s) (LRB): AMPUTATION BELOW KNEE (Left) Discharge to SNF  [name] 02/08/2011, 6:33 AM

## 2011-02-08 NOTE — Progress Notes (Addendum)
ANTICOAGULATION & ANTIBIOTIC CONSULT NOTE - Follow Up Consult  Pharmacy Consult for Vancomycin/Zosyn/Warfarin Indication: L foot gangrene/osteomyelitis (s/p L BKA) and hx DVT/PE  No Known Allergies  Patient Measurements: Height: 6' (182.9 cm) Weight: 363 lb 8.6 oz (164.9 kg) IBW/kg (Calculated) : 77.6    Vital Signs: Temp: 98 F (36.7 C) (12/12 0601) Temp src: Oral (12/12 0601) BP: 115/63 mmHg (12/12 0601) Pulse Rate: 76  (12/12 0601)  Labs:  Basename 02/08/11 0530 02/07/11 0505 02/06/11 0620  HGB 8.5* 8.6* --  HCT 27.4* 27.6* 26.3*  PLT 244 207 196  APTT -- -- --  LABPROT 35.7* 28.4* 25.7*  INR 3.51* 2.62* 2.30*  HEPARINUNFRC -- -- --  CREATININE 1.41* 1.62* 1.81*  CKTOTAL -- -- --  CKMB -- -- --  TROPONINI -- -- --   Estimated Creatinine Clearance: 96.4 ml/min (by C-G formula based on Cr of 1.41).   Medications:  Prescriptions prior to admission  Medication Sig Dispense Refill  . ALPHA LIPOIC ACID PO Take 1 tablet by mouth daily.        . Ascorbic Acid (VITAMIN C PO) Take 1 tablet by mouth daily.        . benazepril (LOTENSIN) 20 MG tablet Take 20 mg by mouth daily.        Marland Kitchen CROMOLYN SODIUM PO Take 1 tablet by mouth daily.        . Cyanocobalamin (VITAMIN B 12 PO) Take 1 tablet by mouth daily.        Marland Kitchen doxycycline (VIBRA-TABS) 100 MG tablet Take 100 mg by mouth 2 (two) times daily.        . ferrous sulfate 325 (65 FE) MG tablet Take 325 mg by mouth 2 (two) times daily.        Marland Kitchen FOLIC ACID PO Take 1 tablet by mouth daily.        Marland Kitchen GARLIC PO Take 1 tablet by mouth daily.        Marland Kitchen glyBURIDE-metformin (GLUCOVANCE) 2.5-500 MG per tablet Take 1 tablet by mouth 2 (two) times daily.        . insulin glargine (LANTUS) 100 UNIT/ML injection Inject 20 Units into the skin 2 (two) times daily.        Marland Kitchen levofloxacin (LEVAQUIN) 250 MG tablet Take 250 mg by mouth daily.        Marland Kitchen levothyroxine (SYNTHROID, LEVOTHROID) 200 MCG tablet Take 200 mcg by mouth daily.        .  metFORMIN (GLUCOPHAGE) 1000 MG tablet Take 1,000 mg by mouth every evening.        . Multiple Vitamins-Minerals (MULTIVITAMINS THER. W/MINERALS) TABS Take 1 tablet by mouth daily.        . nabumetone (RELAFEN) 750 MG tablet Take 750 mg by mouth 2 (two) times daily.        . niacin (NIASPAN) 750 MG CR tablet Take 1,500 mg by mouth at bedtime.       . Omega-3 Fatty Acids (FISH OIL PO) Take 3 tablets by mouth 2 (two) times daily.        . potassium chloride (KLOR-CON) 10 MEQ CR tablet Take 10 mEq by mouth 3 (three) times daily.        Marland Kitchen PRESCRIPTION MEDICATION Take 1 tablet by mouth daily. Tradjenta (linagliptin) 5mg        . simvastatin (ZOCOR) 10 MG tablet Take 10 mg by mouth at bedtime.        . Tamsulosin HCl (FLOMAX) 0.4 MG CAPS  Take 0.4 mg by mouth daily.        . verapamil (VERELAN PM) 180 MG 24 hr capsule Take 180 mg by mouth daily.        Marland Kitchen VITAMIN E PO Take 1 tablet by mouth daily.        Marland Kitchen warfarin (COUMADIN) 3 MG tablet Take 3 mg by mouth daily.         Scheduled:    . benazepril  20 mg Oral Daily  . clindamycin (CLEOCIN) IV  600 mg Intravenous 60 min Pre-Op  . ferrous sulfate  325 mg Oral TID PC  . glyBURIDE  2.5 mg Oral BID WC  . insulin aspart  0-20 Units Subcutaneous TID WC  . insulin aspart  6 Units Subcutaneous TID WC  . insulin glargine  40 Units Subcutaneous QHS  . levothyroxine  200 mcg Oral Daily  . niacin  500 mg Oral QHS  . piperacillin-tazobactam (ZOSYN)  IV  3.375 g Intravenous Q8H  . simvastatin  10 mg Oral QHS  . Tamsulosin HCl  0.4 mg Oral Daily  . vancomycin  750 mg Intravenous Q12H  . verapamil  180 mg Oral Daily  . warfarin  3 mg Oral q1800  . DISCONTD: chlorhexidine  60 mL Topical Once  . DISCONTD: HYDROmorphone      . DISCONTD: potassium chloride  10 mEq Oral TID    Assessment: 55 y.o. M on Vanc/Zosyn prior to BKA for L foot gangrene, now POD#1. On warfarin for hx DVT/PE with a SUPRAtherapeutic INR this a.m. (3.51 << 2.61). Renal function  stable/improving. Doses still appropriate for now.   Goal of Therapy:  INR 2-3, Vanc trough 10-15   Plan:  1. Continue Vancomycin 750 mg IV every 12 hours and Zosyn 3.375g IV every 8 hours 2. Please address antibiotic LOT since source of infection removed (s/p BKA)--usual duration is 24-48 hours if all infection removed with surgery (i.e clean cut) 3. Hold warfarin today given large jump in INR--if patient discharged home would recommend to get INR checked on 02/09/11 (patient states he checks his INR at home and calls it into his doctor) and redose at that time.  4. Will continue to monitor renal function, LOT, de-escalation plans, and any s/sx of bleeding.  Alycia Rossetti Ann 02/08/2011,10:05 AM

## 2011-02-08 NOTE — Progress Notes (Signed)
Physical Therapy Evaluation Patient Details Name: [name] MRN: [id number] DOB: May 28, 1955 Today's Date: 02/08/2011  Problem List:  Patient Active Problem List  Diagnoses  . DIABETES MELLITUS-TYPE II  . HYPERLIPIDEMIA  . OBESITY  . ACUTE POSTHEMORRHAGIC ANEMIA  . ANEMIA-UNSPECIFIED  . HYPERTENSION  . DVT  . DIVERTICULOSIS OF COLON  . BLOOD IN STOOL    Past Medical History:  Past Medical History  Diagnosis Date  . Sleep apnea     uses cpap  . Hypothyroidism   . Anemia   . Blood transfusion   . Chronic kidney disease     hx of kidney stones  . Hypertension   . Arthritis     osteoarthritis  . Pneumonia   . Diabetes mellitus     insulin dependent   Past Surgical History:  Past Surgical History  Procedure Date  . Bilteral hip     replacement & revison  's   . Knee surgery     left knee    . Tonsillectomy     PT Assessment/Plan/Recommendation PT Assessment Clinical Impression Statement: Pt is 55 yo male who underwent left BKA and is having great difficulty coming to terms with this to the point that it is significantly affecting his ability to mobilize.  Will follow pt acutely to work on [place] and mobility to maximize pt function and decrease caregiver burden.  Recommend SNF for rehab. PT Recommendation/Assessment: Patient will need skilled PT in the acute care venue PT Problem List: Decreased strength;Decreased range of motion;Decreased activity tolerance;Decreased balance;Decreased mobility;Decreased coordination;Decreased knowledge of use of DME;Obesity;Pain Barriers to Discharge: Inaccessible home environment;Decreased caregiver support Barriers to Discharge Comments: Pt reports that he needs to be independent to go home and that his doorways will not accomodate a RW, he used his cane to get into bathroom PTA.  He also worried that he will not be able to bath as besides the RW not fitting in the bathroom, a tub transfer bench will not either. PT  Therapy Diagnosis : Difficulty walking;Acute pain;Generalized weakness;Altered mental status PT Plan PT Frequency: Min 3X/week PT Treatment/Interventions: DME instruction;Gait training;Functional mobility training;Therapeutic activities;Therapeutic exercise;Balance training;Patient/family education PT Recommendation Recommendations for Other Services: OT consult Follow Up Recommendations: Skilled nursing facility;24 hour supervision/assistance Equipment Recommended: Rolling walker with 5" wheels;Other (comment) (pt has a RW but needs a wide, bariatric RW) PT Goals  Acute Rehab PT Goals PT Goal Formulation: With patient Time For Goal Achievement: 2 weeks Pt will Roll Supine to Right Side: with min assist PT Goal: Rolling Supine to Right Side - Progress: Progressing toward goal Pt will Roll Supine to Left Side: with min assist PT Goal: Rolling Supine to Left Side - Progress: Progressing toward goal Pt will go Supine/Side to Sit: with min assist;with HOB 0 degrees;with rail PT Goal: Supine/Side to Sit - Progress: Progressing toward goal Pt will go Sit to Supine/Side: with min assist;with HOB 0 degrees PT Goal: Sit to Supine/Side - Progress: Progressing toward goal Pt will go Sit to Stand: with +2 total assist;Other (comment) (pt >75%) PT Goal: Sit to Stand - Progress: Progressing toward goal Pt will go Stand to Sit: with min assist PT Goal: Stand to Sit - Progress: Progressing toward goal Pt will Transfer Bed to Chair/Chair to Bed: with +2 total assist (pt >75%) PT Transfer Goal: Bed to Chair/Chair to Bed - Progress: Progressing toward goal Pt will Perform Home Exercise Program: with supervision, verbal cues required/provided PT Goal: Perform Home Exercise Program - Progress: Progressing  toward goal  PT Evaluation Precautions/Restrictions  Precautions Precautions: Fall Required Braces or Orthoses: No Restrictions Weight Bearing Restrictions: Yes LLE Weight Bearing: Non weight bearing  (due to amputation) Other Position/Activity Restrictions: BKA positioning Prior Functioning  Home Living Lives With: Family (father-in-law, cannot give him physical assist) Receives Help From: Family Type of Home: House Home Layout: One level Home Access: Other (comment) (N/A at this point, pt going SNF) Home Adaptive Equipment: Walker - rolling Prior Function Level of Independence: Requires assistive device for independence Able to Take Stairs?: Yes Driving: Yes Vocation: Retired Public librarian Arousal/Alertness: Awake/alert Overall Cognitive Status: Impaired Orientation Level: Oriented X4 Cognition - Other Comments: Pt very anxious, feels depressed about amputation and repoeatedly says that he can't do this and he doesn't know how life will ever be normal again and that he is useless now.  Attempted to give encouragement but pt very down.   Sensation/Coordination Sensation Light Touch: Appears Intact Stereognosis: Not tested Hot/Cold: Not tested Proprioception: Not tested Coordination Gross Motor Movements are Fluid and Coordinated: No Fine Motor Movements are Fluid and Coordinated: Yes Coordination and Movement Description: All mvmt labored and difficult sue to morbid obesity/ body girth Extremity Assessment RUE Assessment RUE Assessment: Within Functional Limits [facility] but still with difficulty using for transfers due to siz) LUE Assessment LUE Assessment: Within Functional Limits RLE Assessment RLE Assessment: Exceptions to [facility] RLE Strength RLE Overall Strength Comments: overall strength 4+/5 but difficulty taking full wt onto the RLE and fully extending the right knee in standing LLE Assessment LLE Assessment: Exceptions to [facility] LLE Strength LLE Overall Strength Comments: only able to lift residual limb about 3" off bed Mobility (including Balance) Bed Mobility Bed Mobility: Yes Rolling Right: 1: +2 Total assist;Patient percentage (comment) (50%) Rolling Right  Details (indicate cue type and reason): vc's given for each mvmt in sequence b/c pt focused on being unable to move Right Sidelying to Sit: 1: +2 Total assist;HOB flat;Patient percentage (comment) (50%) Right Sidelying to Sit Details (indicate cue type and reason): assist given at LE's and trunk to initiate and fully complete sititing Sitting - Scoot to Edge of Bed: 1: +2 Total assist;Patient percentage (comment) (20%) Sitting - Scoot to Edge of Bed Details (indicate cue type and reason): pt fearful of getting to EOB, assist given at each hip Transfers Transfers: Yes Sit to Stand: 1: +2 Total assist;Patient percentage (comment);From bed;From elevated surface;With upper extremity assist (60%) Sit to Stand Details (indicate cue type and reason): max tactile cues given at trunk and vc's given to push through arms and extend right knee  Stand to Sit: 1: +2 Total assist;Patient percentage (comment);Other (comment);With upper extremity assist (60%, to w/c) Stand to Sit Details: max vc's for pt to reach to w/c and let himself sit Stand Pivot Transfers: 1: +2 Total assist;Patient percentage (comment);From elevated surface (60%, with RW) Stand Pivot Transfer Details (indicate cue type and reason): pt very fearful throughout transfer, very self-limiting, pivoted on right foot, max facilitation given at hips Ambulation/Gait Ambulation/Gait: No Stairs: No Wheelchair Mobility Wheelchair Mobility: No  Posture/Postural Control Posture/Postural Control: Postural limitations Postural Limitations: decreased trunk control due to abdominal girth Balance Balance Assessed: Yes Static Sitting Balance Static Sitting - Balance Support: Bilateral upper extremity supported Static Sitting - Level of Assistance: 5: Stand by assistance Static Sitting - Comment/# of Minutes: 5 Exercise  Amputee Exercises Quad Sets: Strengthening;AROM;5 reps;Left;Supine Chair Push Up: AROM;Strengthening;Both;Seated;5 reps Discussed  proper positioning of LLE in and out of bed End of  Session PT - End of Session Equipment Utilized During Treatment: Gait belt;Other (comment) (amputee board) Activity Tolerance: Treatment limited secondary to agitation;Patient limited by pain;Patient limited by fatigue Patient left: in chair;Other (comment);with call bell in reach (w/c) Nurse Communication: Mobility status for transfers General Behavior During Session: Other (comment) (fearful, anxious) Cognition: Impaired (see above)  [place], [place] 02/08/2011, 2:27 PM

## 2011-02-09 ENCOUNTER — Encounter (HOSPITAL_COMMUNITY): Payer: Self-pay | Admitting: Orthopedic Surgery

## 2011-02-09 LAB — GLUCOSE, CAPILLARY: Glucose-Capillary: 166 mg/dL — ABNORMAL HIGH (ref 70–99)

## 2011-02-09 LAB — CBC
HCT: 27.3 % — ABNORMAL LOW (ref 39.0–52.0)
Hemoglobin: 8.5 g/dL — ABNORMAL LOW (ref 13.0–17.0)
MCH: 28.1 pg (ref 26.0–34.0)
MCHC: 31.1 g/dL (ref 30.0–36.0)
MCV: 90.1 fL (ref 78.0–100.0)

## 2011-02-09 LAB — BASIC METABOLIC PANEL
BUN: 27 mg/dL — ABNORMAL HIGH (ref 6–23)
Chloride: 104 mEq/L (ref 96–112)
Glucose, Bld: 144 mg/dL — ABNORMAL HIGH (ref 70–99)
Potassium: 4.9 mEq/L (ref 3.5–5.1)

## 2011-02-09 MED ORDER — WARFARIN SODIUM 1 MG PO TABS
1.5000 mg | ORAL_TABLET | Freq: Once | ORAL | Status: AC
  Administered 2011-02-09: 1.5 mg via ORAL
  Filled 2011-02-09: qty 1

## 2011-02-09 NOTE — Progress Notes (Signed)
Occupational Therapy Evaluation Patient Details Name: [name] MRN: [id number] DOB: 1955-07-11 Today's Date: 02/09/2011  Problem List:  Patient Active Problem List  Diagnoses  . DIABETES MELLITUS-TYPE II  . HYPERLIPIDEMIA  . OBESITY  . ACUTE POSTHEMORRHAGIC ANEMIA  . ANEMIA-UNSPECIFIED  . HYPERTENSION  . DVT  . DIVERTICULOSIS OF COLON  . BLOOD IN STOOL    Past Medical History:  Past Medical History  Diagnosis Date  . Sleep apnea     uses cpap  . Hypothyroidism   . Anemia   . Blood transfusion   . Chronic kidney disease     hx of kidney stones  . Hypertension   . Arthritis     osteoarthritis  . Pneumonia   . Diabetes mellitus     insulin dependent   Past Surgical History:  Past Surgical History  Procedure Date  . Bilteral hip     replacement & revison  's   . Knee surgery     left knee    . Tonsillectomy   . Amputation 02/07/2011    Procedure: AMPUTATION BELOW KNEE;  Surgeon: Newt Minion, MD;  Location: [place];  Service: Orthopedics;  Laterality: Left;  Left Below Knee Amputation    OT Assessment/Plan/Recommendation OT Assessment Clinical Impression Statement: Pt s/p L BKA and demonstrates with decreased I with ADLs/functiontal transfers.  Pt will benefit from acute OT to increase I with ADLs/functional trasnfers to maximize rehab at SNF OT Recommendation/Assessment: Patient will need skilled OT in the acute care venue OT Problem List: Decreased activity tolerance;Decreased strength;Decreased knowledge of use of DME or AE;Obesity;Pain OT Therapy Diagnosis : Generalized weakness;Acute pain OT Plan OT Frequency: Min 1X/week OT Treatment/Interventions: Self-care/ADL training;DME and/or AE instruction;Therapeutic activities;Patient/family education OT Recommendation Follow Up Recommendations: Skilled nursing facility Equipment Recommended: Defer to next venue Individuals Consulted Consulted and Agree with Results and Recommendations: Patient OT  Goals Acute Rehab OT Goals OT Goal Formulation: With patient Time For Goal Achievement: 2 weeks ADL Goals Pt Will Transfer to Toilet: with 2+ total assist;with DME;3-in-1;Stand pivot transfer (pt >75%) ADL Goal: Toilet Transfer - Progress: Other (comment) Miscellaneous OT Goals Miscellaneous OT Goal #1: Pt will perform bed mobility with min A in prep for EOB ADLs. OT Goal: Miscellaneous Goal #1 - Progress: Other (comment) Miscellaneous OT Goal #2: Pt will tolerate sitting EOB unsupported >15 minutes for ADLs. OT Goal: Miscellaneous Goal #2 - Progress: Other (comment)  OT Evaluation Precautions/Restrictions  Precautions Precautions: Fall Required Braces or Orthoses: No Restrictions Weight Bearing Restrictions: Yes LLE Weight Bearing: Non weight bearing Other Position/Activity Restrictions: BKA positioning Prior Functioning Home Living Lives With: Family;Other (Comment) (father-in-law, cannot give him physical assist) Receives Help From: Family Type of Home: House Home Layout: One level Home Access: Other (comment) (N/A at this point, pt going SNF) Bathroom Shower/Tub: Chiropodist: Standard Bathroom Accessibility: No Home Adaptive Equipment: Walker - rolling;Other (comment) (toilet riser ) Prior Function Level of Independence: Requires assistive device for independence Able to Take Stairs?: Yes Driving: Yes Vocation: Retired ADL ADL Eating/Feeding: Simulated;Independent Where Assessed - Eating/Feeding: Bed level Grooming: Simulated;Set up Where Assessed - Grooming: Supine, head of bed up Upper Body Bathing: Simulated;Set up Where Assessed - Upper Body Bathing: Sitting, bed;Supported Lower Body Bathing: Simulated;Maximal assistance Where Assessed - Lower Body Bathing: Sitting, bed;Supported;Lean right and/or left Upper Body Dressing: Simulated;Set up Where Assessed - Upper Body Dressing: Sitting, bed;Supported Lower Body Dressing: Simulated;Maximal  assistance Where Assessed - Lower Body Dressing: Sitting, bed;Lean right  and/or left;Supported Toilet Transfer: Not assessed (Pt declined OOB transfer) Toileting - Clothing Manipulation: Simulated;Maximal assistance Where Assessed - Toileting Clothing Manipulation: Lean right and/or left Toileting - Hygiene: Simulated;Moderate assistance Where Assessed - Toileting Hygiene: Other (comment) (simulated leaning right/left EOB) Tub/Shower Transfer: Not assessed ADL Comments: Eval limited to EOB activity due to pt's fatigue and labile state.  Pt educated on importance of lightly rubbing LLE to desensitize residual limb.  Vision/Perception    Cognition Cognition Arousal/Alertness: Awake/alert Overall Cognitive Status: Appears within functional limits for tasks assessed Orientation Level: Oriented X4 Cognition - Other Comments: Pt very labile this afternoon and unmotivated to participate.  Sensation/Coordination   Extremity Assessment RUE Assessment RUE Assessment: Within Functional Limits (Difficulty using for transfers due to size) LUE Assessment LUE Assessment: Within Functional Limits Mobility  Bed Mobility Bed Mobility: Yes Rolling Right: 1: +2 Total assist;Patient percentage (comment);With rail (50%) Rolling Right Details (indicate cue type and reason): vc for sequencing and hand placement Right Sidelying to Sit: 1: +2 Total assist;HOB flat;With rails (50%) Right Sidelying to Sit Details (indicate cue type and reason): Assist given to LE and trunk to facilitate movement and provide support Sitting - Scoot to Edge of Bed: 1: +2 Total assist;Patient percentage (comment) Sit to Supine - Right: 1: +2 Total assist;HOB flat (10%) Transfers Transfers: No Exercises   End of Session OT - End of Session Activity Tolerance: Patient limited by fatigue Patient left: in bed;with call bell in reach General Behavior During Session: Other (comment) (labile) Cognition: WFL for tasks  performed   Darrol Jump 02/09/2011, 3:32 PM  02/09/2011 Darrol Jump OTR/L Pager [phone] Office [phone]

## 2011-02-09 NOTE — Progress Notes (Signed)
Subjective: 2 Days Post-Op Procedure(s) (LRB): AMPUTATION BELOW KNEE (Left) Patient resting this morning.   Objective: Vital signs in last 24 hours: Temp:  [97.9 F (36.6 C)-98.4 F (36.9 C)] 97.9 F (36.6 C) (12/13 0547) Pulse Rate:  [78-85] 80  (12/13 0547) Resp:  [18-20] 18  (12/13 0547) BP: (126-145)/(69-80) 126/69 mmHg (12/13 0547) SpO2:  [99 %-100 %] 99 % (12/13 0547)  Intake/Output from previous day: 12/12 0701 - 12/13 0700 In: 1320 [P.O.:1320] Out: 1251 [Urine:1250; Stool:1] Intake/Output this shift:     Basename 02/09/11 0625 02/08/11 0530 02/07/11 0505  HGB 8.5* 8.5* 8.6*    Basename 02/09/11 0625 02/08/11 0530  WBC 10.3 10.9*  RBC 3.03* 3.03*  HCT 27.3* 27.4*  PLT 249 244    Basename 02/08/11 0530 02/07/11 0505  NA 135 131*  K 4.9 5.2*  CL 105 104  CO2 21 21  BUN 35* 40*  CREATININE 1.41* 1.62*  GLUCOSE 216* 198*  CALCIUM 8.2* 8.1*    Basename 02/08/11 0530 02/07/11 0505  LABPT -- --  INR 3.51* 2.62*    Incision: no drainage  Assessment/Plan: 2 Days Post-Op Procedure(s) (LRB): AMPUTATION BELOW KNEE (Left) Discharge to SNF  DUDA,MARCUS V 02/09/2011, 7:16 AM

## 2011-02-09 NOTE — Progress Notes (Signed)
ANTICOAGULATION CONSULT NOTE - Follow Up Consult  Pharmacy Consult for Warfarin Indication: hx DVT/PE  No Known Allergies  Patient Measurements: Height: 6' (182.9 cm) Weight: 363 lb 8.6 oz (164.9 kg) IBW/kg (Calculated) : 77.6    Vital Signs: Temp: 97.9 F (36.6 C) (12/13 0547) Temp src: Oral (12/13 0547) BP: 126/69 mmHg (12/13 0547) Pulse Rate: 80  (12/13 0547)  Labs:  Basename 02/09/11 0625 02/08/11 0530 02/07/11 0505  HGB 8.5* 8.5* --  HCT 27.3* 27.4* 27.6*  PLT 249 244 207  APTT -- -- --  LABPROT 31.6* 35.7* 28.4*  INR 3.00* 3.51* 2.62*  HEPARINUNFRC -- -- --  CREATININE 1.19 1.41* 1.62*  CKTOTAL -- -- --  CKMB -- -- --  TROPONINI -- -- --   Estimated Creatinine Clearance: 114.2 ml/min (by C-G formula based on Cr of 1.19).     Assessment: 55 y.o. M on warfarin for hx DVT/PE with a therapeutic INR this a.m. (3 << 3.51). Dose held yesterday. No bleeding noted. Noted plan to d/c soon to SNF.  Goal of Therapy:  INR 2-3   Plan:  1. Warfarin 1.5 mg today  2. Will continue to monitor INR and any s/sx of bleeding.  Sindy Guadeloupe 02/09/2011,11:33 AM

## 2011-02-10 LAB — CBC
HCT: 27.3 % — ABNORMAL LOW (ref 39.0–52.0)
Hemoglobin: 8.4 g/dL — ABNORMAL LOW (ref 13.0–17.0)
MCH: 27.8 pg (ref 26.0–34.0)
MCHC: 30.8 g/dL (ref 30.0–36.0)
MCV: 90.4 fL (ref 78.0–100.0)
Platelets: 240 K/uL (ref 150–400)
RBC: 3.02 MIL/uL — ABNORMAL LOW (ref 4.22–5.81)
RDW: 15.5 % (ref 11.5–15.5)
WBC: 9.2 K/uL (ref 4.0–10.5)

## 2011-02-10 LAB — BASIC METABOLIC PANEL WITH GFR
BUN: 24 mg/dL — ABNORMAL HIGH (ref 6–23)
CO2: 24 meq/L (ref 19–32)
Calcium: 8.7 mg/dL (ref 8.4–10.5)
Chloride: 105 meq/L (ref 96–112)
Creatinine, Ser: 1.23 mg/dL (ref 0.50–1.35)
GFR calc Af Amer: 76 mL/min — ABNORMAL LOW
GFR calc non Af Amer: 65 mL/min — ABNORMAL LOW
Glucose, Bld: 151 mg/dL — ABNORMAL HIGH (ref 70–99)
Potassium: 5 meq/L (ref 3.5–5.1)
Sodium: 136 meq/L (ref 135–145)

## 2011-02-10 LAB — PROTIME-INR: Prothrombin Time: 27 seconds — ABNORMAL HIGH (ref 11.6–15.2)

## 2011-02-10 LAB — GLUCOSE, CAPILLARY

## 2011-02-10 MED ORDER — HYDROCODONE-ACETAMINOPHEN 5-500 MG PO TABS: 1.0000 | ORAL_TABLET | Freq: Four times a day (QID) | ORAL | Status: AC | PRN

## 2011-02-10 NOTE — Discharge Summary (Signed)
Physician Discharge Summary  Patient ID: [name] MRN: [id number] DOB/AGE: 11/16/1955 55 y.o.  Admit date: 02/01/2011 Discharge date: 02/10/2011  Admission Diagnoses:gangrene left foot   Discharge Diagnoses: same   Discharged Condition:stabe  Hospital Course: Patients hospital course was unremarkable, he progressed with PT, was not safe for D/C to home and required SNF for PT   Consults: none  Significant Diagnostic Studies:routine labs  Treatments:left BKA   Discharge Exam: Blood pressure 151/84, pulse 79, temperature 98.3 F (36.8 C), temperature source Oral, resp. rate 18, height 6' (1.829 m), weight 164.9 kg (363 lb 8.6 oz), SpO2 100.00%. Incision clean and dry  Disposition: [facility]  Discharge Orders    Future Orders Please Complete By Expires   Diet - low sodium heart healthy      Call MD / Call 911      Comments:   If you experience chest pain or shortness of breath, CALL 911 and be transported to the hospital emergency room.  If you develope a fever above 101 F, pus (white drainage) or increased drainage or redness at the wound, or calf pain, call your surgeon's office.   Constipation Prevention      Comments:   Drink plenty of fluids.  Prune juice may be helpful.  You may use a stool softener, such as Colace (over the counter) 100 mg twice a day.  Use MiraLax (over the counter) for constipation as needed.   Increase activity slowly as tolerated      Weight Bearing as taught in Physical Therapy      Comments:   Use a walker or crutches as instructed.   Discharge instructions      Comments:   Work on left knee extension, NWB left leg     Current Discharge Medication List    START taking these medications   Details  HYDROcodone-acetaminophen (VICODIN) 5-500 MG per tablet Take 1 tablet by mouth every 6 (six) hours as needed for pain. Qty: 60 tablet, Refills: 0      CONTINUE these medications which have NOT CHANGED   Details  ALPHA  LIPOIC ACID PO Take 1 tablet by mouth daily.      Ascorbic Acid (VITAMIN C PO) Take 1 tablet by mouth daily.      benazepril (LOTENSIN) 20 MG tablet Take 20 mg by mouth daily.      CROMOLYN SODIUM PO Take 1 tablet by mouth daily.      Cyanocobalamin (VITAMIN B 12 PO) Take 1 tablet by mouth daily.      doxycycline (VIBRA-TABS) 100 MG tablet Take 100 mg by mouth 2 (two) times daily.      ferrous sulfate 325 (65 FE) MG tablet Take 325 mg by mouth 2 (two) times daily.      FOLIC ACID PO Take 1 tablet by mouth daily.      GARLIC PO Take 1 tablet by mouth daily.      glyBURIDE-metformin (GLUCOVANCE) 2.5-500 MG per tablet Take 1 tablet by mouth 2 (two) times daily.      insulin glargine (LANTUS) 100 UNIT/ML injection Inject 20 Units into the skin 2 (two) times daily.      levofloxacin (LEVAQUIN) 250 MG tablet Take 250 mg by mouth daily.      levothyroxine (SYNTHROID, LEVOTHROID) 200 MCG tablet Take 200 mcg by mouth daily.      metFORMIN (GLUCOPHAGE) 1000 MG tablet Take 1,000 mg by mouth every evening.      Multiple Vitamins-Minerals (MULTIVITAMINS  THER. W/MINERALS) TABS Take 1 tablet by mouth daily.      nabumetone (RELAFEN) 750 MG tablet Take 750 mg by mouth 2 (two) times daily.      niacin (NIASPAN) 750 MG CR tablet Take 1,500 mg by mouth at bedtime.     Omega-3 Fatty Acids (FISH OIL PO) Take 3 tablets by mouth 2 (two) times daily.      potassium chloride (KLOR-CON) 10 MEQ CR tablet Take 10 mEq by mouth 3 (three) times daily.      PRESCRIPTION MEDICATION Take 1 tablet by mouth daily. Tradjenta (linagliptin) 5mg      simvastatin (ZOCOR) 10 MG tablet Take 10 mg by mouth at bedtime.      Tamsulosin HCl (FLOMAX) 0.4 MG CAPS Take 0.4 mg by mouth daily.      verapamil (VERELAN PM) 180 MG 24 hr capsule Take 180 mg by mouth daily.      VITAMIN E PO Take 1 tablet by mouth daily.      warfarin (COUMADIN) 3 MG tablet Take 3 mg by mouth daily.         Follow-up Information     Follow up with Ogden Handlin V, MD in 3 weeks.   Contact information:   [facility] [phone]          Signed: Newt Minion 02/10/2011, 8:20 AM

## 2011-02-10 NOTE — Progress Notes (Signed)
Pt MD- patient is stable for d/c today to SNF. Bed offers discussed with patient and he chose [facility]. He had been a resident there back in May 2012.  Patient will be transported via EMS. Notified his nurse- Sunday Spillers. Patient is agreeable to d/c.  Williemae Area, BSW, =02/10/2011 4:30 PM

## 2011-02-10 NOTE — Progress Notes (Signed)
Pt. Declined PT today .  Will follow up early next week.

## 2011-02-10 NOTE — Progress Notes (Signed)
Subjective: 3 Days Post-Op Procedure(s) (LRB): AMPUTATION BELOW KNEE (Left)    Objective: Vital signs in last 24 hours: Temp:  [97.7 F (36.5 C)-98.3 F (36.8 C)] 98.3 F (36.8 C) (12/14 0554) Pulse Rate:  [79-81] 79  (12/14 0554) Resp:  [18] 18  (12/14 0554) BP: (143-151)/(72-84) 151/84 mmHg (12/14 0554) SpO2:  [99 %-100 %] 100 % (12/14 0554)  Intake/Output from previous day: 12/13 0701 - 12/14 0700 In: 1794.8 [I.V.:1794.8] Out: 1778 [Urine:1775; Stool:3] Intake/Output this shift: Total I/O In: 1794.8 [I.V.:1794.8] Out: 1177 [Urine:1175; Stool:2]   Basename 02/09/11 0625 02/08/11 0530  HGB 8.5* 8.5*    Basename 02/09/11 0625 02/08/11 0530  WBC 10.3 10.9*  RBC 3.03* 3.03*  HCT 27.3* 27.4*  PLT 249 244    Basename 02/09/11 0625 02/08/11 0530  NA 133* 135  K 4.9 4.9  CL 104 105  CO2 22 21  BUN 27* 35*  CREATININE 1.19 1.41*  GLUCOSE 144* 216*  CALCIUM 8.4 8.2*    Basename 02/09/11 0625 02/08/11 0530  LABPT -- --  INR 3.00* 3.51*    Neurologically intact  Assessment/Plan: 3 Days Post-Op Procedure(s) (LRB): AMPUTATION BELOW KNEE (Left) Discharge to SNF  DUDA,MARCUS V 02/10/2011, 6:19 AM

## 2011-02-13 LAB — GLUCOSE, CAPILLARY: Glucose-Capillary: 199 mg/dL — ABNORMAL HIGH (ref 70–99)

## 2011-02-28 DIAGNOSIS — Z4789 Encounter for other orthopedic aftercare: Secondary | ICD-10-CM | POA: Diagnosis not present

## 2011-02-28 DIAGNOSIS — L97209 Non-pressure chronic ulcer of unspecified calf with unspecified severity: Secondary | ICD-10-CM | POA: Diagnosis not present

## 2011-02-28 DIAGNOSIS — L02619 Cutaneous abscess of unspecified foot: Secondary | ICD-10-CM | POA: Diagnosis not present

## 2011-02-28 DIAGNOSIS — R05 Cough: Secondary | ICD-10-CM | POA: Diagnosis not present

## 2011-02-28 DIAGNOSIS — E119 Type 2 diabetes mellitus without complications: Secondary | ICD-10-CM | POA: Diagnosis not present

## 2011-02-28 DIAGNOSIS — M6281 Muscle weakness (generalized): Secondary | ICD-10-CM | POA: Diagnosis not present

## 2011-02-28 DIAGNOSIS — S88119A Complete traumatic amputation at level between knee and ankle, unspecified lower leg, initial encounter: Secondary | ICD-10-CM | POA: Diagnosis not present

## 2011-03-22 DIAGNOSIS — L02619 Cutaneous abscess of unspecified foot: Secondary | ICD-10-CM | POA: Diagnosis not present

## 2011-03-22 DIAGNOSIS — L97209 Non-pressure chronic ulcer of unspecified calf with unspecified severity: Secondary | ICD-10-CM | POA: Diagnosis not present

## 2011-03-22 DIAGNOSIS — L03119 Cellulitis of unspecified part of limb: Secondary | ICD-10-CM | POA: Diagnosis not present

## 2011-03-28 DIAGNOSIS — I82409 Acute embolism and thrombosis of unspecified deep veins of unspecified lower extremity: Secondary | ICD-10-CM | POA: Diagnosis not present

## 2011-04-01 DIAGNOSIS — E119 Type 2 diabetes mellitus without complications: Secondary | ICD-10-CM | POA: Diagnosis not present

## 2011-04-01 DIAGNOSIS — R269 Unspecified abnormalities of gait and mobility: Secondary | ICD-10-CM | POA: Diagnosis not present

## 2011-04-01 DIAGNOSIS — S88119A Complete traumatic amputation at level between knee and ankle, unspecified lower leg, initial encounter: Secondary | ICD-10-CM | POA: Diagnosis not present

## 2011-04-01 DIAGNOSIS — N189 Chronic kidney disease, unspecified: Secondary | ICD-10-CM | POA: Diagnosis not present

## 2011-04-01 DIAGNOSIS — I129 Hypertensive chronic kidney disease with stage 1 through stage 4 chronic kidney disease, or unspecified chronic kidney disease: Secondary | ICD-10-CM | POA: Diagnosis not present

## 2011-04-01 DIAGNOSIS — I739 Peripheral vascular disease, unspecified: Secondary | ICD-10-CM | POA: Diagnosis not present

## 2011-04-03 DIAGNOSIS — R269 Unspecified abnormalities of gait and mobility: Secondary | ICD-10-CM | POA: Diagnosis not present

## 2011-04-03 DIAGNOSIS — S88119A Complete traumatic amputation at level between knee and ankle, unspecified lower leg, initial encounter: Secondary | ICD-10-CM | POA: Diagnosis not present

## 2011-04-03 DIAGNOSIS — I129 Hypertensive chronic kidney disease with stage 1 through stage 4 chronic kidney disease, or unspecified chronic kidney disease: Secondary | ICD-10-CM | POA: Diagnosis not present

## 2011-04-03 DIAGNOSIS — N189 Chronic kidney disease, unspecified: Secondary | ICD-10-CM | POA: Diagnosis not present

## 2011-04-03 DIAGNOSIS — E119 Type 2 diabetes mellitus without complications: Secondary | ICD-10-CM | POA: Diagnosis not present

## 2011-04-03 DIAGNOSIS — I739 Peripheral vascular disease, unspecified: Secondary | ICD-10-CM | POA: Diagnosis not present

## 2011-04-04 DIAGNOSIS — E119 Type 2 diabetes mellitus without complications: Secondary | ICD-10-CM | POA: Diagnosis not present

## 2011-04-04 DIAGNOSIS — I129 Hypertensive chronic kidney disease with stage 1 through stage 4 chronic kidney disease, or unspecified chronic kidney disease: Secondary | ICD-10-CM | POA: Diagnosis not present

## 2011-04-04 DIAGNOSIS — N189 Chronic kidney disease, unspecified: Secondary | ICD-10-CM | POA: Diagnosis not present

## 2011-04-04 DIAGNOSIS — S88119A Complete traumatic amputation at level between knee and ankle, unspecified lower leg, initial encounter: Secondary | ICD-10-CM | POA: Diagnosis not present

## 2011-04-04 DIAGNOSIS — R269 Unspecified abnormalities of gait and mobility: Secondary | ICD-10-CM | POA: Diagnosis not present

## 2011-04-04 DIAGNOSIS — I739 Peripheral vascular disease, unspecified: Secondary | ICD-10-CM | POA: Diagnosis not present

## 2011-04-06 DIAGNOSIS — N189 Chronic kidney disease, unspecified: Secondary | ICD-10-CM | POA: Diagnosis not present

## 2011-04-06 DIAGNOSIS — S88119A Complete traumatic amputation at level between knee and ankle, unspecified lower leg, initial encounter: Secondary | ICD-10-CM | POA: Diagnosis not present

## 2011-04-06 DIAGNOSIS — E119 Type 2 diabetes mellitus without complications: Secondary | ICD-10-CM | POA: Diagnosis not present

## 2011-04-06 DIAGNOSIS — I129 Hypertensive chronic kidney disease with stage 1 through stage 4 chronic kidney disease, or unspecified chronic kidney disease: Secondary | ICD-10-CM | POA: Diagnosis not present

## 2011-04-06 DIAGNOSIS — R269 Unspecified abnormalities of gait and mobility: Secondary | ICD-10-CM | POA: Diagnosis not present

## 2011-04-06 DIAGNOSIS — I739 Peripheral vascular disease, unspecified: Secondary | ICD-10-CM | POA: Diagnosis not present

## 2011-04-07 DIAGNOSIS — R269 Unspecified abnormalities of gait and mobility: Secondary | ICD-10-CM | POA: Diagnosis not present

## 2011-04-07 DIAGNOSIS — E119 Type 2 diabetes mellitus without complications: Secondary | ICD-10-CM | POA: Diagnosis not present

## 2011-04-07 DIAGNOSIS — I129 Hypertensive chronic kidney disease with stage 1 through stage 4 chronic kidney disease, or unspecified chronic kidney disease: Secondary | ICD-10-CM | POA: Diagnosis not present

## 2011-04-07 DIAGNOSIS — N189 Chronic kidney disease, unspecified: Secondary | ICD-10-CM | POA: Diagnosis not present

## 2011-04-07 DIAGNOSIS — S88119A Complete traumatic amputation at level between knee and ankle, unspecified lower leg, initial encounter: Secondary | ICD-10-CM | POA: Diagnosis not present

## 2011-04-07 DIAGNOSIS — I739 Peripheral vascular disease, unspecified: Secondary | ICD-10-CM | POA: Diagnosis not present

## 2011-04-10 DIAGNOSIS — I739 Peripheral vascular disease, unspecified: Secondary | ICD-10-CM | POA: Diagnosis not present

## 2011-04-10 DIAGNOSIS — N189 Chronic kidney disease, unspecified: Secondary | ICD-10-CM | POA: Diagnosis not present

## 2011-04-10 DIAGNOSIS — E119 Type 2 diabetes mellitus without complications: Secondary | ICD-10-CM | POA: Diagnosis not present

## 2011-04-10 DIAGNOSIS — R269 Unspecified abnormalities of gait and mobility: Secondary | ICD-10-CM | POA: Diagnosis not present

## 2011-04-10 DIAGNOSIS — I129 Hypertensive chronic kidney disease with stage 1 through stage 4 chronic kidney disease, or unspecified chronic kidney disease: Secondary | ICD-10-CM | POA: Diagnosis not present

## 2011-04-10 DIAGNOSIS — S88119A Complete traumatic amputation at level between knee and ankle, unspecified lower leg, initial encounter: Secondary | ICD-10-CM | POA: Diagnosis not present

## 2011-04-11 DIAGNOSIS — E119 Type 2 diabetes mellitus without complications: Secondary | ICD-10-CM | POA: Diagnosis not present

## 2011-04-11 DIAGNOSIS — I739 Peripheral vascular disease, unspecified: Secondary | ICD-10-CM | POA: Diagnosis not present

## 2011-04-11 DIAGNOSIS — R269 Unspecified abnormalities of gait and mobility: Secondary | ICD-10-CM | POA: Diagnosis not present

## 2011-04-11 DIAGNOSIS — I129 Hypertensive chronic kidney disease with stage 1 through stage 4 chronic kidney disease, or unspecified chronic kidney disease: Secondary | ICD-10-CM | POA: Diagnosis not present

## 2011-04-11 DIAGNOSIS — S88119A Complete traumatic amputation at level between knee and ankle, unspecified lower leg, initial encounter: Secondary | ICD-10-CM | POA: Diagnosis not present

## 2011-04-11 DIAGNOSIS — N189 Chronic kidney disease, unspecified: Secondary | ICD-10-CM | POA: Diagnosis not present

## 2011-04-12 DIAGNOSIS — I129 Hypertensive chronic kidney disease with stage 1 through stage 4 chronic kidney disease, or unspecified chronic kidney disease: Secondary | ICD-10-CM | POA: Diagnosis not present

## 2011-04-12 DIAGNOSIS — R269 Unspecified abnormalities of gait and mobility: Secondary | ICD-10-CM | POA: Diagnosis not present

## 2011-04-12 DIAGNOSIS — N189 Chronic kidney disease, unspecified: Secondary | ICD-10-CM | POA: Diagnosis not present

## 2011-04-12 DIAGNOSIS — L97209 Non-pressure chronic ulcer of unspecified calf with unspecified severity: Secondary | ICD-10-CM | POA: Diagnosis not present

## 2011-04-12 DIAGNOSIS — I739 Peripheral vascular disease, unspecified: Secondary | ICD-10-CM | POA: Diagnosis not present

## 2011-04-12 DIAGNOSIS — S88119A Complete traumatic amputation at level between knee and ankle, unspecified lower leg, initial encounter: Secondary | ICD-10-CM | POA: Diagnosis not present

## 2011-04-12 DIAGNOSIS — E119 Type 2 diabetes mellitus without complications: Secondary | ICD-10-CM | POA: Diagnosis not present

## 2011-04-13 DIAGNOSIS — I129 Hypertensive chronic kidney disease with stage 1 through stage 4 chronic kidney disease, or unspecified chronic kidney disease: Secondary | ICD-10-CM | POA: Diagnosis not present

## 2011-04-13 DIAGNOSIS — R269 Unspecified abnormalities of gait and mobility: Secondary | ICD-10-CM | POA: Diagnosis not present

## 2011-04-13 DIAGNOSIS — E119 Type 2 diabetes mellitus without complications: Secondary | ICD-10-CM | POA: Diagnosis not present

## 2011-04-13 DIAGNOSIS — S88119A Complete traumatic amputation at level between knee and ankle, unspecified lower leg, initial encounter: Secondary | ICD-10-CM | POA: Diagnosis not present

## 2011-04-13 DIAGNOSIS — I739 Peripheral vascular disease, unspecified: Secondary | ICD-10-CM | POA: Diagnosis not present

## 2011-04-13 DIAGNOSIS — N189 Chronic kidney disease, unspecified: Secondary | ICD-10-CM | POA: Diagnosis not present

## 2011-04-14 DIAGNOSIS — E119 Type 2 diabetes mellitus without complications: Secondary | ICD-10-CM | POA: Diagnosis not present

## 2011-04-14 DIAGNOSIS — S88119A Complete traumatic amputation at level between knee and ankle, unspecified lower leg, initial encounter: Secondary | ICD-10-CM | POA: Diagnosis not present

## 2011-04-14 DIAGNOSIS — N189 Chronic kidney disease, unspecified: Secondary | ICD-10-CM | POA: Diagnosis not present

## 2011-04-14 DIAGNOSIS — I129 Hypertensive chronic kidney disease with stage 1 through stage 4 chronic kidney disease, or unspecified chronic kidney disease: Secondary | ICD-10-CM | POA: Diagnosis not present

## 2011-04-14 DIAGNOSIS — I739 Peripheral vascular disease, unspecified: Secondary | ICD-10-CM | POA: Diagnosis not present

## 2011-04-14 DIAGNOSIS — R269 Unspecified abnormalities of gait and mobility: Secondary | ICD-10-CM | POA: Diagnosis not present

## 2011-04-17 DIAGNOSIS — S88119A Complete traumatic amputation at level between knee and ankle, unspecified lower leg, initial encounter: Secondary | ICD-10-CM | POA: Diagnosis not present

## 2011-04-17 DIAGNOSIS — R269 Unspecified abnormalities of gait and mobility: Secondary | ICD-10-CM | POA: Diagnosis not present

## 2011-04-17 DIAGNOSIS — I129 Hypertensive chronic kidney disease with stage 1 through stage 4 chronic kidney disease, or unspecified chronic kidney disease: Secondary | ICD-10-CM | POA: Diagnosis not present

## 2011-04-17 DIAGNOSIS — E119 Type 2 diabetes mellitus without complications: Secondary | ICD-10-CM | POA: Diagnosis not present

## 2011-04-17 DIAGNOSIS — I739 Peripheral vascular disease, unspecified: Secondary | ICD-10-CM | POA: Diagnosis not present

## 2011-04-17 DIAGNOSIS — N189 Chronic kidney disease, unspecified: Secondary | ICD-10-CM | POA: Diagnosis not present

## 2011-04-20 DIAGNOSIS — R269 Unspecified abnormalities of gait and mobility: Secondary | ICD-10-CM | POA: Diagnosis not present

## 2011-04-20 DIAGNOSIS — I739 Peripheral vascular disease, unspecified: Secondary | ICD-10-CM | POA: Diagnosis not present

## 2011-04-20 DIAGNOSIS — I129 Hypertensive chronic kidney disease with stage 1 through stage 4 chronic kidney disease, or unspecified chronic kidney disease: Secondary | ICD-10-CM | POA: Diagnosis not present

## 2011-04-20 DIAGNOSIS — E119 Type 2 diabetes mellitus without complications: Secondary | ICD-10-CM | POA: Diagnosis not present

## 2011-04-20 DIAGNOSIS — N189 Chronic kidney disease, unspecified: Secondary | ICD-10-CM | POA: Diagnosis not present

## 2011-04-20 DIAGNOSIS — S88119A Complete traumatic amputation at level between knee and ankle, unspecified lower leg, initial encounter: Secondary | ICD-10-CM | POA: Diagnosis not present

## 2011-04-21 DIAGNOSIS — I129 Hypertensive chronic kidney disease with stage 1 through stage 4 chronic kidney disease, or unspecified chronic kidney disease: Secondary | ICD-10-CM | POA: Diagnosis not present

## 2011-04-21 DIAGNOSIS — N189 Chronic kidney disease, unspecified: Secondary | ICD-10-CM | POA: Diagnosis not present

## 2011-04-21 DIAGNOSIS — E119 Type 2 diabetes mellitus without complications: Secondary | ICD-10-CM | POA: Diagnosis not present

## 2011-04-21 DIAGNOSIS — S88119A Complete traumatic amputation at level between knee and ankle, unspecified lower leg, initial encounter: Secondary | ICD-10-CM | POA: Diagnosis not present

## 2011-04-21 DIAGNOSIS — R269 Unspecified abnormalities of gait and mobility: Secondary | ICD-10-CM | POA: Diagnosis not present

## 2011-04-21 DIAGNOSIS — I739 Peripheral vascular disease, unspecified: Secondary | ICD-10-CM | POA: Diagnosis not present

## 2011-04-24 DIAGNOSIS — E119 Type 2 diabetes mellitus without complications: Secondary | ICD-10-CM | POA: Diagnosis not present

## 2011-04-24 DIAGNOSIS — R269 Unspecified abnormalities of gait and mobility: Secondary | ICD-10-CM | POA: Diagnosis not present

## 2011-04-24 DIAGNOSIS — I129 Hypertensive chronic kidney disease with stage 1 through stage 4 chronic kidney disease, or unspecified chronic kidney disease: Secondary | ICD-10-CM | POA: Diagnosis not present

## 2011-04-24 DIAGNOSIS — N189 Chronic kidney disease, unspecified: Secondary | ICD-10-CM | POA: Diagnosis not present

## 2011-04-24 DIAGNOSIS — I739 Peripheral vascular disease, unspecified: Secondary | ICD-10-CM | POA: Diagnosis not present

## 2011-04-24 DIAGNOSIS — S88119A Complete traumatic amputation at level between knee and ankle, unspecified lower leg, initial encounter: Secondary | ICD-10-CM | POA: Diagnosis not present

## 2011-05-02 DIAGNOSIS — S88119A Complete traumatic amputation at level between knee and ankle, unspecified lower leg, initial encounter: Secondary | ICD-10-CM | POA: Diagnosis not present

## 2011-05-02 DIAGNOSIS — E119 Type 2 diabetes mellitus without complications: Secondary | ICD-10-CM | POA: Diagnosis not present

## 2011-05-02 DIAGNOSIS — R269 Unspecified abnormalities of gait and mobility: Secondary | ICD-10-CM | POA: Diagnosis not present

## 2011-05-02 DIAGNOSIS — I129 Hypertensive chronic kidney disease with stage 1 through stage 4 chronic kidney disease, or unspecified chronic kidney disease: Secondary | ICD-10-CM | POA: Diagnosis not present

## 2011-05-02 DIAGNOSIS — N189 Chronic kidney disease, unspecified: Secondary | ICD-10-CM | POA: Diagnosis not present

## 2011-05-02 DIAGNOSIS — I739 Peripheral vascular disease, unspecified: Secondary | ICD-10-CM | POA: Diagnosis not present

## 2011-05-03 DIAGNOSIS — S88119A Complete traumatic amputation at level between knee and ankle, unspecified lower leg, initial encounter: Secondary | ICD-10-CM | POA: Diagnosis not present

## 2011-05-03 DIAGNOSIS — I739 Peripheral vascular disease, unspecified: Secondary | ICD-10-CM | POA: Diagnosis not present

## 2011-05-03 DIAGNOSIS — E119 Type 2 diabetes mellitus without complications: Secondary | ICD-10-CM | POA: Diagnosis not present

## 2011-05-03 DIAGNOSIS — N189 Chronic kidney disease, unspecified: Secondary | ICD-10-CM | POA: Diagnosis not present

## 2011-05-03 DIAGNOSIS — I129 Hypertensive chronic kidney disease with stage 1 through stage 4 chronic kidney disease, or unspecified chronic kidney disease: Secondary | ICD-10-CM | POA: Diagnosis not present

## 2011-05-03 DIAGNOSIS — R269 Unspecified abnormalities of gait and mobility: Secondary | ICD-10-CM | POA: Diagnosis not present

## 2011-05-05 DIAGNOSIS — R269 Unspecified abnormalities of gait and mobility: Secondary | ICD-10-CM | POA: Diagnosis not present

## 2011-05-05 DIAGNOSIS — E119 Type 2 diabetes mellitus without complications: Secondary | ICD-10-CM | POA: Diagnosis not present

## 2011-05-05 DIAGNOSIS — I129 Hypertensive chronic kidney disease with stage 1 through stage 4 chronic kidney disease, or unspecified chronic kidney disease: Secondary | ICD-10-CM | POA: Diagnosis not present

## 2011-05-05 DIAGNOSIS — I739 Peripheral vascular disease, unspecified: Secondary | ICD-10-CM | POA: Diagnosis not present

## 2011-05-05 DIAGNOSIS — N189 Chronic kidney disease, unspecified: Secondary | ICD-10-CM | POA: Diagnosis not present

## 2011-05-05 DIAGNOSIS — S88119A Complete traumatic amputation at level between knee and ankle, unspecified lower leg, initial encounter: Secondary | ICD-10-CM | POA: Diagnosis not present

## 2011-05-10 DIAGNOSIS — S88119A Complete traumatic amputation at level between knee and ankle, unspecified lower leg, initial encounter: Secondary | ICD-10-CM | POA: Diagnosis not present

## 2011-05-10 DIAGNOSIS — I129 Hypertensive chronic kidney disease with stage 1 through stage 4 chronic kidney disease, or unspecified chronic kidney disease: Secondary | ICD-10-CM | POA: Diagnosis not present

## 2011-05-10 DIAGNOSIS — I739 Peripheral vascular disease, unspecified: Secondary | ICD-10-CM | POA: Diagnosis not present

## 2011-05-10 DIAGNOSIS — N189 Chronic kidney disease, unspecified: Secondary | ICD-10-CM | POA: Diagnosis not present

## 2011-05-10 DIAGNOSIS — R269 Unspecified abnormalities of gait and mobility: Secondary | ICD-10-CM | POA: Diagnosis not present

## 2011-05-10 DIAGNOSIS — E119 Type 2 diabetes mellitus without complications: Secondary | ICD-10-CM | POA: Diagnosis not present

## 2011-05-17 DIAGNOSIS — I129 Hypertensive chronic kidney disease with stage 1 through stage 4 chronic kidney disease, or unspecified chronic kidney disease: Secondary | ICD-10-CM | POA: Diagnosis not present

## 2011-05-17 DIAGNOSIS — E119 Type 2 diabetes mellitus without complications: Secondary | ICD-10-CM | POA: Diagnosis not present

## 2011-05-17 DIAGNOSIS — N189 Chronic kidney disease, unspecified: Secondary | ICD-10-CM | POA: Diagnosis not present

## 2011-05-17 DIAGNOSIS — I739 Peripheral vascular disease, unspecified: Secondary | ICD-10-CM | POA: Diagnosis not present

## 2011-05-17 DIAGNOSIS — R269 Unspecified abnormalities of gait and mobility: Secondary | ICD-10-CM | POA: Diagnosis not present

## 2011-05-17 DIAGNOSIS — S88119A Complete traumatic amputation at level between knee and ankle, unspecified lower leg, initial encounter: Secondary | ICD-10-CM | POA: Diagnosis not present

## 2011-05-24 DIAGNOSIS — N189 Chronic kidney disease, unspecified: Secondary | ICD-10-CM | POA: Diagnosis not present

## 2011-05-24 DIAGNOSIS — I129 Hypertensive chronic kidney disease with stage 1 through stage 4 chronic kidney disease, or unspecified chronic kidney disease: Secondary | ICD-10-CM | POA: Diagnosis not present

## 2011-05-24 DIAGNOSIS — S88119A Complete traumatic amputation at level between knee and ankle, unspecified lower leg, initial encounter: Secondary | ICD-10-CM | POA: Diagnosis not present

## 2011-05-24 DIAGNOSIS — R269 Unspecified abnormalities of gait and mobility: Secondary | ICD-10-CM | POA: Diagnosis not present

## 2011-05-24 DIAGNOSIS — E119 Type 2 diabetes mellitus without complications: Secondary | ICD-10-CM | POA: Diagnosis not present

## 2011-05-24 DIAGNOSIS — I739 Peripheral vascular disease, unspecified: Secondary | ICD-10-CM | POA: Diagnosis not present

## 2011-05-25 DIAGNOSIS — L97209 Non-pressure chronic ulcer of unspecified calf with unspecified severity: Secondary | ICD-10-CM | POA: Diagnosis not present

## 2011-05-25 DIAGNOSIS — L02619 Cutaneous abscess of unspecified foot: Secondary | ICD-10-CM | POA: Diagnosis not present

## 2011-05-30 DIAGNOSIS — R269 Unspecified abnormalities of gait and mobility: Secondary | ICD-10-CM | POA: Diagnosis not present

## 2011-05-30 DIAGNOSIS — I739 Peripheral vascular disease, unspecified: Secondary | ICD-10-CM | POA: Diagnosis not present

## 2011-05-30 DIAGNOSIS — S88119A Complete traumatic amputation at level between knee and ankle, unspecified lower leg, initial encounter: Secondary | ICD-10-CM | POA: Diagnosis not present

## 2011-05-30 DIAGNOSIS — N189 Chronic kidney disease, unspecified: Secondary | ICD-10-CM | POA: Diagnosis not present

## 2011-05-30 DIAGNOSIS — I129 Hypertensive chronic kidney disease with stage 1 through stage 4 chronic kidney disease, or unspecified chronic kidney disease: Secondary | ICD-10-CM | POA: Diagnosis not present

## 2011-05-30 DIAGNOSIS — E119 Type 2 diabetes mellitus without complications: Secondary | ICD-10-CM | POA: Diagnosis not present

## 2011-05-31 DIAGNOSIS — E119 Type 2 diabetes mellitus without complications: Secondary | ICD-10-CM | POA: Diagnosis not present

## 2011-05-31 DIAGNOSIS — T8789 Other complications of amputation stump: Secondary | ICD-10-CM | POA: Diagnosis not present

## 2011-05-31 DIAGNOSIS — G4733 Obstructive sleep apnea (adult) (pediatric): Secondary | ICD-10-CM | POA: Diagnosis not present

## 2011-05-31 DIAGNOSIS — I739 Peripheral vascular disease, unspecified: Secondary | ICD-10-CM | POA: Diagnosis not present

## 2011-05-31 DIAGNOSIS — I129 Hypertensive chronic kidney disease with stage 1 through stage 4 chronic kidney disease, or unspecified chronic kidney disease: Secondary | ICD-10-CM | POA: Diagnosis not present

## 2011-05-31 DIAGNOSIS — N189 Chronic kidney disease, unspecified: Secondary | ICD-10-CM | POA: Diagnosis not present

## 2011-06-07 DIAGNOSIS — N189 Chronic kidney disease, unspecified: Secondary | ICD-10-CM | POA: Diagnosis not present

## 2011-06-07 DIAGNOSIS — I129 Hypertensive chronic kidney disease with stage 1 through stage 4 chronic kidney disease, or unspecified chronic kidney disease: Secondary | ICD-10-CM | POA: Diagnosis not present

## 2011-06-07 DIAGNOSIS — I739 Peripheral vascular disease, unspecified: Secondary | ICD-10-CM | POA: Diagnosis not present

## 2011-06-07 DIAGNOSIS — G4733 Obstructive sleep apnea (adult) (pediatric): Secondary | ICD-10-CM | POA: Diagnosis not present

## 2011-06-07 DIAGNOSIS — T8789 Other complications of amputation stump: Secondary | ICD-10-CM | POA: Diagnosis not present

## 2011-06-07 DIAGNOSIS — E119 Type 2 diabetes mellitus without complications: Secondary | ICD-10-CM | POA: Diagnosis not present

## 2011-06-09 DIAGNOSIS — I82409 Acute embolism and thrombosis of unspecified deep veins of unspecified lower extremity: Secondary | ICD-10-CM | POA: Diagnosis not present

## 2011-06-13 DIAGNOSIS — N189 Chronic kidney disease, unspecified: Secondary | ICD-10-CM | POA: Diagnosis not present

## 2011-06-13 DIAGNOSIS — I129 Hypertensive chronic kidney disease with stage 1 through stage 4 chronic kidney disease, or unspecified chronic kidney disease: Secondary | ICD-10-CM | POA: Diagnosis not present

## 2011-06-13 DIAGNOSIS — G4733 Obstructive sleep apnea (adult) (pediatric): Secondary | ICD-10-CM | POA: Diagnosis not present

## 2011-06-13 DIAGNOSIS — T8789 Other complications of amputation stump: Secondary | ICD-10-CM | POA: Diagnosis not present

## 2011-06-13 DIAGNOSIS — E119 Type 2 diabetes mellitus without complications: Secondary | ICD-10-CM | POA: Diagnosis not present

## 2011-06-13 DIAGNOSIS — I739 Peripheral vascular disease, unspecified: Secondary | ICD-10-CM | POA: Diagnosis not present

## 2011-06-15 DIAGNOSIS — L97209 Non-pressure chronic ulcer of unspecified calf with unspecified severity: Secondary | ICD-10-CM | POA: Diagnosis not present

## 2011-06-21 DIAGNOSIS — E119 Type 2 diabetes mellitus without complications: Secondary | ICD-10-CM | POA: Diagnosis not present

## 2011-06-21 DIAGNOSIS — I739 Peripheral vascular disease, unspecified: Secondary | ICD-10-CM | POA: Diagnosis not present

## 2011-06-21 DIAGNOSIS — G4733 Obstructive sleep apnea (adult) (pediatric): Secondary | ICD-10-CM | POA: Diagnosis not present

## 2011-06-21 DIAGNOSIS — N189 Chronic kidney disease, unspecified: Secondary | ICD-10-CM | POA: Diagnosis not present

## 2011-06-21 DIAGNOSIS — T8789 Other complications of amputation stump: Secondary | ICD-10-CM | POA: Diagnosis not present

## 2011-06-21 DIAGNOSIS — I129 Hypertensive chronic kidney disease with stage 1 through stage 4 chronic kidney disease, or unspecified chronic kidney disease: Secondary | ICD-10-CM | POA: Diagnosis not present

## 2011-06-28 DIAGNOSIS — T8789 Other complications of amputation stump: Secondary | ICD-10-CM | POA: Diagnosis not present

## 2011-06-28 DIAGNOSIS — N189 Chronic kidney disease, unspecified: Secondary | ICD-10-CM | POA: Diagnosis not present

## 2011-06-28 DIAGNOSIS — I129 Hypertensive chronic kidney disease with stage 1 through stage 4 chronic kidney disease, or unspecified chronic kidney disease: Secondary | ICD-10-CM | POA: Diagnosis not present

## 2011-06-28 DIAGNOSIS — E119 Type 2 diabetes mellitus without complications: Secondary | ICD-10-CM | POA: Diagnosis not present

## 2011-06-28 DIAGNOSIS — G4733 Obstructive sleep apnea (adult) (pediatric): Secondary | ICD-10-CM | POA: Diagnosis not present

## 2011-06-28 DIAGNOSIS — I739 Peripheral vascular disease, unspecified: Secondary | ICD-10-CM | POA: Diagnosis not present

## 2011-07-04 DIAGNOSIS — N189 Chronic kidney disease, unspecified: Secondary | ICD-10-CM | POA: Diagnosis not present

## 2011-07-04 DIAGNOSIS — G4733 Obstructive sleep apnea (adult) (pediatric): Secondary | ICD-10-CM | POA: Diagnosis not present

## 2011-07-04 DIAGNOSIS — I129 Hypertensive chronic kidney disease with stage 1 through stage 4 chronic kidney disease, or unspecified chronic kidney disease: Secondary | ICD-10-CM | POA: Diagnosis not present

## 2011-07-04 DIAGNOSIS — T8789 Other complications of amputation stump: Secondary | ICD-10-CM | POA: Diagnosis not present

## 2011-07-04 DIAGNOSIS — I739 Peripheral vascular disease, unspecified: Secondary | ICD-10-CM | POA: Diagnosis not present

## 2011-07-04 DIAGNOSIS — E119 Type 2 diabetes mellitus without complications: Secondary | ICD-10-CM | POA: Diagnosis not present

## 2011-07-06 DIAGNOSIS — L97209 Non-pressure chronic ulcer of unspecified calf with unspecified severity: Secondary | ICD-10-CM | POA: Diagnosis not present

## 2011-07-12 DIAGNOSIS — T8789 Other complications of amputation stump: Secondary | ICD-10-CM | POA: Diagnosis not present

## 2011-07-12 DIAGNOSIS — E119 Type 2 diabetes mellitus without complications: Secondary | ICD-10-CM | POA: Diagnosis not present

## 2011-07-12 DIAGNOSIS — I129 Hypertensive chronic kidney disease with stage 1 through stage 4 chronic kidney disease, or unspecified chronic kidney disease: Secondary | ICD-10-CM | POA: Diagnosis not present

## 2011-07-12 DIAGNOSIS — I739 Peripheral vascular disease, unspecified: Secondary | ICD-10-CM | POA: Diagnosis not present

## 2011-07-12 DIAGNOSIS — N189 Chronic kidney disease, unspecified: Secondary | ICD-10-CM | POA: Diagnosis not present

## 2011-07-12 DIAGNOSIS — G4733 Obstructive sleep apnea (adult) (pediatric): Secondary | ICD-10-CM | POA: Diagnosis not present

## 2011-07-14 DIAGNOSIS — T8789 Other complications of amputation stump: Secondary | ICD-10-CM | POA: Diagnosis not present

## 2011-07-14 DIAGNOSIS — N189 Chronic kidney disease, unspecified: Secondary | ICD-10-CM | POA: Diagnosis not present

## 2011-07-14 DIAGNOSIS — E119 Type 2 diabetes mellitus without complications: Secondary | ICD-10-CM | POA: Diagnosis not present

## 2011-07-14 DIAGNOSIS — I129 Hypertensive chronic kidney disease with stage 1 through stage 4 chronic kidney disease, or unspecified chronic kidney disease: Secondary | ICD-10-CM | POA: Diagnosis not present

## 2011-07-14 DIAGNOSIS — I739 Peripheral vascular disease, unspecified: Secondary | ICD-10-CM | POA: Diagnosis not present

## 2011-07-14 DIAGNOSIS — G4733 Obstructive sleep apnea (adult) (pediatric): Secondary | ICD-10-CM | POA: Diagnosis not present

## 2011-07-15 DIAGNOSIS — I82409 Acute embolism and thrombosis of unspecified deep veins of unspecified lower extremity: Secondary | ICD-10-CM | POA: Diagnosis not present

## 2011-07-17 DIAGNOSIS — T8789 Other complications of amputation stump: Secondary | ICD-10-CM | POA: Diagnosis not present

## 2011-07-17 DIAGNOSIS — G4733 Obstructive sleep apnea (adult) (pediatric): Secondary | ICD-10-CM | POA: Diagnosis not present

## 2011-07-17 DIAGNOSIS — N189 Chronic kidney disease, unspecified: Secondary | ICD-10-CM | POA: Diagnosis not present

## 2011-07-17 DIAGNOSIS — E119 Type 2 diabetes mellitus without complications: Secondary | ICD-10-CM | POA: Diagnosis not present

## 2011-07-17 DIAGNOSIS — I129 Hypertensive chronic kidney disease with stage 1 through stage 4 chronic kidney disease, or unspecified chronic kidney disease: Secondary | ICD-10-CM | POA: Diagnosis not present

## 2011-07-17 DIAGNOSIS — I739 Peripheral vascular disease, unspecified: Secondary | ICD-10-CM | POA: Diagnosis not present

## 2011-07-19 DIAGNOSIS — N189 Chronic kidney disease, unspecified: Secondary | ICD-10-CM | POA: Diagnosis not present

## 2011-07-19 DIAGNOSIS — G4733 Obstructive sleep apnea (adult) (pediatric): Secondary | ICD-10-CM | POA: Diagnosis not present

## 2011-07-19 DIAGNOSIS — I129 Hypertensive chronic kidney disease with stage 1 through stage 4 chronic kidney disease, or unspecified chronic kidney disease: Secondary | ICD-10-CM | POA: Diagnosis not present

## 2011-07-19 DIAGNOSIS — T8789 Other complications of amputation stump: Secondary | ICD-10-CM | POA: Diagnosis not present

## 2011-07-19 DIAGNOSIS — I739 Peripheral vascular disease, unspecified: Secondary | ICD-10-CM | POA: Diagnosis not present

## 2011-07-19 DIAGNOSIS — E119 Type 2 diabetes mellitus without complications: Secondary | ICD-10-CM | POA: Diagnosis not present

## 2011-07-20 DIAGNOSIS — L03119 Cellulitis of unspecified part of limb: Secondary | ICD-10-CM | POA: Diagnosis not present

## 2011-07-20 DIAGNOSIS — L97209 Non-pressure chronic ulcer of unspecified calf with unspecified severity: Secondary | ICD-10-CM | POA: Diagnosis not present

## 2011-07-20 DIAGNOSIS — L02619 Cutaneous abscess of unspecified foot: Secondary | ICD-10-CM | POA: Diagnosis not present

## 2011-07-21 DIAGNOSIS — N189 Chronic kidney disease, unspecified: Secondary | ICD-10-CM | POA: Diagnosis not present

## 2011-07-21 DIAGNOSIS — I739 Peripheral vascular disease, unspecified: Secondary | ICD-10-CM | POA: Diagnosis not present

## 2011-07-21 DIAGNOSIS — T8789 Other complications of amputation stump: Secondary | ICD-10-CM | POA: Diagnosis not present

## 2011-07-21 DIAGNOSIS — I129 Hypertensive chronic kidney disease with stage 1 through stage 4 chronic kidney disease, or unspecified chronic kidney disease: Secondary | ICD-10-CM | POA: Diagnosis not present

## 2011-07-21 DIAGNOSIS — G4733 Obstructive sleep apnea (adult) (pediatric): Secondary | ICD-10-CM | POA: Diagnosis not present

## 2011-07-21 DIAGNOSIS — E119 Type 2 diabetes mellitus without complications: Secondary | ICD-10-CM | POA: Diagnosis not present

## 2011-07-22 DIAGNOSIS — I10 Essential (primary) hypertension: Secondary | ICD-10-CM | POA: Diagnosis not present

## 2011-07-22 DIAGNOSIS — E119 Type 2 diabetes mellitus without complications: Secondary | ICD-10-CM | POA: Diagnosis not present

## 2011-07-22 DIAGNOSIS — E785 Hyperlipidemia, unspecified: Secondary | ICD-10-CM | POA: Diagnosis not present

## 2011-07-22 DIAGNOSIS — H6120 Impacted cerumen, unspecified ear: Secondary | ICD-10-CM | POA: Diagnosis not present

## 2011-07-24 DIAGNOSIS — I739 Peripheral vascular disease, unspecified: Secondary | ICD-10-CM | POA: Diagnosis not present

## 2011-07-24 DIAGNOSIS — E119 Type 2 diabetes mellitus without complications: Secondary | ICD-10-CM | POA: Diagnosis not present

## 2011-07-24 DIAGNOSIS — G4733 Obstructive sleep apnea (adult) (pediatric): Secondary | ICD-10-CM | POA: Diagnosis not present

## 2011-07-24 DIAGNOSIS — T8789 Other complications of amputation stump: Secondary | ICD-10-CM | POA: Diagnosis not present

## 2011-07-24 DIAGNOSIS — I129 Hypertensive chronic kidney disease with stage 1 through stage 4 chronic kidney disease, or unspecified chronic kidney disease: Secondary | ICD-10-CM | POA: Diagnosis not present

## 2011-07-24 DIAGNOSIS — N189 Chronic kidney disease, unspecified: Secondary | ICD-10-CM | POA: Diagnosis not present

## 2011-07-26 DIAGNOSIS — E119 Type 2 diabetes mellitus without complications: Secondary | ICD-10-CM | POA: Diagnosis not present

## 2011-07-26 DIAGNOSIS — I129 Hypertensive chronic kidney disease with stage 1 through stage 4 chronic kidney disease, or unspecified chronic kidney disease: Secondary | ICD-10-CM | POA: Diagnosis not present

## 2011-07-26 DIAGNOSIS — I739 Peripheral vascular disease, unspecified: Secondary | ICD-10-CM | POA: Diagnosis not present

## 2011-07-26 DIAGNOSIS — G4733 Obstructive sleep apnea (adult) (pediatric): Secondary | ICD-10-CM | POA: Diagnosis not present

## 2011-07-26 DIAGNOSIS — N189 Chronic kidney disease, unspecified: Secondary | ICD-10-CM | POA: Diagnosis not present

## 2011-07-26 DIAGNOSIS — T8789 Other complications of amputation stump: Secondary | ICD-10-CM | POA: Diagnosis not present

## 2011-07-28 DIAGNOSIS — G4733 Obstructive sleep apnea (adult) (pediatric): Secondary | ICD-10-CM | POA: Diagnosis not present

## 2011-07-28 DIAGNOSIS — I739 Peripheral vascular disease, unspecified: Secondary | ICD-10-CM | POA: Diagnosis not present

## 2011-07-28 DIAGNOSIS — I129 Hypertensive chronic kidney disease with stage 1 through stage 4 chronic kidney disease, or unspecified chronic kidney disease: Secondary | ICD-10-CM | POA: Diagnosis not present

## 2011-07-28 DIAGNOSIS — N189 Chronic kidney disease, unspecified: Secondary | ICD-10-CM | POA: Diagnosis not present

## 2011-07-28 DIAGNOSIS — T8789 Other complications of amputation stump: Secondary | ICD-10-CM | POA: Diagnosis not present

## 2011-07-28 DIAGNOSIS — E119 Type 2 diabetes mellitus without complications: Secondary | ICD-10-CM | POA: Diagnosis not present

## 2011-07-30 DIAGNOSIS — I739 Peripheral vascular disease, unspecified: Secondary | ICD-10-CM | POA: Diagnosis not present

## 2011-07-30 DIAGNOSIS — I129 Hypertensive chronic kidney disease with stage 1 through stage 4 chronic kidney disease, or unspecified chronic kidney disease: Secondary | ICD-10-CM | POA: Diagnosis not present

## 2011-07-30 DIAGNOSIS — G4733 Obstructive sleep apnea (adult) (pediatric): Secondary | ICD-10-CM | POA: Diagnosis not present

## 2011-07-30 DIAGNOSIS — N189 Chronic kidney disease, unspecified: Secondary | ICD-10-CM | POA: Diagnosis not present

## 2011-07-30 DIAGNOSIS — E119 Type 2 diabetes mellitus without complications: Secondary | ICD-10-CM | POA: Diagnosis not present

## 2011-07-30 DIAGNOSIS — T8789 Other complications of amputation stump: Secondary | ICD-10-CM | POA: Diagnosis not present

## 2011-08-09 DIAGNOSIS — L03119 Cellulitis of unspecified part of limb: Secondary | ICD-10-CM | POA: Diagnosis not present

## 2011-08-09 DIAGNOSIS — L02619 Cutaneous abscess of unspecified foot: Secondary | ICD-10-CM | POA: Diagnosis not present

## 2011-08-09 DIAGNOSIS — L97209 Non-pressure chronic ulcer of unspecified calf with unspecified severity: Secondary | ICD-10-CM | POA: Diagnosis not present

## 2011-08-15 DIAGNOSIS — Z7982 Long term (current) use of aspirin: Secondary | ICD-10-CM | POA: Diagnosis not present

## 2011-08-15 DIAGNOSIS — I8390 Asymptomatic varicose veins of unspecified lower extremity: Secondary | ICD-10-CM | POA: Diagnosis not present

## 2011-08-15 DIAGNOSIS — D649 Anemia, unspecified: Secondary | ICD-10-CM | POA: Diagnosis not present

## 2011-08-15 DIAGNOSIS — L8993 Pressure ulcer of unspecified site, stage 3: Secondary | ICD-10-CM | POA: Diagnosis not present

## 2011-08-15 DIAGNOSIS — E1149 Type 2 diabetes mellitus with other diabetic neurological complication: Secondary | ICD-10-CM | POA: Diagnosis not present

## 2011-08-15 DIAGNOSIS — L97209 Non-pressure chronic ulcer of unspecified calf with unspecified severity: Secondary | ICD-10-CM | POA: Diagnosis not present

## 2011-08-15 DIAGNOSIS — J449 Chronic obstructive pulmonary disease, unspecified: Secondary | ICD-10-CM | POA: Diagnosis not present

## 2011-08-15 DIAGNOSIS — T879 Unspecified complications of amputation stump: Secondary | ICD-10-CM | POA: Diagnosis not present

## 2011-08-15 DIAGNOSIS — I890 Lymphedema, not elsewhere classified: Secondary | ICD-10-CM | POA: Diagnosis not present

## 2011-08-15 DIAGNOSIS — E119 Type 2 diabetes mellitus without complications: Secondary | ICD-10-CM | POA: Diagnosis not present

## 2011-08-15 DIAGNOSIS — T8740 Infection of amputation stump, unspecified extremity: Secondary | ICD-10-CM | POA: Diagnosis not present

## 2011-08-15 DIAGNOSIS — M21619 Bunion of unspecified foot: Secondary | ICD-10-CM | POA: Diagnosis not present

## 2011-08-15 DIAGNOSIS — Z794 Long term (current) use of insulin: Secondary | ICD-10-CM | POA: Diagnosis not present

## 2011-08-15 DIAGNOSIS — I10 Essential (primary) hypertension: Secondary | ICD-10-CM | POA: Diagnosis not present

## 2011-08-22 DIAGNOSIS — L8993 Pressure ulcer of unspecified site, stage 3: Secondary | ICD-10-CM | POA: Diagnosis not present

## 2011-08-22 DIAGNOSIS — T8740 Infection of amputation stump, unspecified extremity: Secondary | ICD-10-CM | POA: Diagnosis not present

## 2011-08-22 DIAGNOSIS — T879 Unspecified complications of amputation stump: Secondary | ICD-10-CM | POA: Diagnosis not present

## 2011-08-22 DIAGNOSIS — I890 Lymphedema, not elsewhere classified: Secondary | ICD-10-CM | POA: Diagnosis not present

## 2011-08-22 DIAGNOSIS — L97209 Non-pressure chronic ulcer of unspecified calf with unspecified severity: Secondary | ICD-10-CM | POA: Diagnosis not present

## 2011-08-22 DIAGNOSIS — I10 Essential (primary) hypertension: Secondary | ICD-10-CM | POA: Diagnosis not present

## 2011-08-25 DIAGNOSIS — I82409 Acute embolism and thrombosis of unspecified deep veins of unspecified lower extremity: Secondary | ICD-10-CM | POA: Diagnosis not present

## 2011-08-30 DIAGNOSIS — L97209 Non-pressure chronic ulcer of unspecified calf with unspecified severity: Secondary | ICD-10-CM | POA: Diagnosis not present

## 2011-08-30 DIAGNOSIS — L03119 Cellulitis of unspecified part of limb: Secondary | ICD-10-CM | POA: Diagnosis not present

## 2011-08-30 DIAGNOSIS — L02619 Cutaneous abscess of unspecified foot: Secondary | ICD-10-CM | POA: Diagnosis not present

## 2011-09-05 DIAGNOSIS — I890 Lymphedema, not elsewhere classified: Secondary | ICD-10-CM | POA: Diagnosis not present

## 2011-09-05 DIAGNOSIS — L97209 Non-pressure chronic ulcer of unspecified calf with unspecified severity: Secondary | ICD-10-CM | POA: Diagnosis not present

## 2011-09-05 DIAGNOSIS — T879 Unspecified complications of amputation stump: Secondary | ICD-10-CM | POA: Diagnosis not present

## 2011-09-05 DIAGNOSIS — Z7982 Long term (current) use of aspirin: Secondary | ICD-10-CM | POA: Diagnosis not present

## 2011-09-05 DIAGNOSIS — J449 Chronic obstructive pulmonary disease, unspecified: Secondary | ICD-10-CM | POA: Diagnosis not present

## 2011-09-05 DIAGNOSIS — I10 Essential (primary) hypertension: Secondary | ICD-10-CM | POA: Diagnosis not present

## 2011-09-05 DIAGNOSIS — I8390 Asymptomatic varicose veins of unspecified lower extremity: Secondary | ICD-10-CM | POA: Diagnosis not present

## 2011-09-05 DIAGNOSIS — L8993 Pressure ulcer of unspecified site, stage 3: Secondary | ICD-10-CM | POA: Diagnosis not present

## 2011-09-05 DIAGNOSIS — M21619 Bunion of unspecified foot: Secondary | ICD-10-CM | POA: Diagnosis not present

## 2011-09-05 DIAGNOSIS — E119 Type 2 diabetes mellitus without complications: Secondary | ICD-10-CM | POA: Diagnosis not present

## 2011-09-05 DIAGNOSIS — Z794 Long term (current) use of insulin: Secondary | ICD-10-CM | POA: Diagnosis not present

## 2011-09-05 DIAGNOSIS — D649 Anemia, unspecified: Secondary | ICD-10-CM | POA: Diagnosis not present

## 2011-09-12 DIAGNOSIS — L97209 Non-pressure chronic ulcer of unspecified calf with unspecified severity: Secondary | ICD-10-CM | POA: Diagnosis not present

## 2011-09-12 DIAGNOSIS — T879 Unspecified complications of amputation stump: Secondary | ICD-10-CM | POA: Diagnosis not present

## 2011-09-12 DIAGNOSIS — I890 Lymphedema, not elsewhere classified: Secondary | ICD-10-CM | POA: Diagnosis not present

## 2011-09-12 DIAGNOSIS — L8993 Pressure ulcer of unspecified site, stage 3: Secondary | ICD-10-CM | POA: Diagnosis not present

## 2011-09-12 DIAGNOSIS — I10 Essential (primary) hypertension: Secondary | ICD-10-CM | POA: Diagnosis not present

## 2011-09-19 DIAGNOSIS — L97209 Non-pressure chronic ulcer of unspecified calf with unspecified severity: Secondary | ICD-10-CM | POA: Diagnosis not present

## 2011-09-19 DIAGNOSIS — I10 Essential (primary) hypertension: Secondary | ICD-10-CM | POA: Diagnosis not present

## 2011-09-19 DIAGNOSIS — I890 Lymphedema, not elsewhere classified: Secondary | ICD-10-CM | POA: Diagnosis not present

## 2011-09-19 DIAGNOSIS — T879 Unspecified complications of amputation stump: Secondary | ICD-10-CM | POA: Diagnosis not present

## 2011-09-19 DIAGNOSIS — L8993 Pressure ulcer of unspecified site, stage 3: Secondary | ICD-10-CM | POA: Diagnosis not present

## 2011-09-26 DIAGNOSIS — L97209 Non-pressure chronic ulcer of unspecified calf with unspecified severity: Secondary | ICD-10-CM | POA: Diagnosis not present

## 2011-09-26 DIAGNOSIS — L8993 Pressure ulcer of unspecified site, stage 3: Secondary | ICD-10-CM | POA: Diagnosis not present

## 2011-09-26 DIAGNOSIS — I10 Essential (primary) hypertension: Secondary | ICD-10-CM | POA: Diagnosis not present

## 2011-09-26 DIAGNOSIS — T879 Unspecified complications of amputation stump: Secondary | ICD-10-CM | POA: Diagnosis not present

## 2011-09-26 DIAGNOSIS — I890 Lymphedema, not elsewhere classified: Secondary | ICD-10-CM | POA: Diagnosis not present

## 2011-09-27 DIAGNOSIS — L97209 Non-pressure chronic ulcer of unspecified calf with unspecified severity: Secondary | ICD-10-CM | POA: Diagnosis not present

## 2011-09-28 DIAGNOSIS — N390 Urinary tract infection, site not specified: Secondary | ICD-10-CM | POA: Diagnosis not present

## 2011-09-28 DIAGNOSIS — A499 Bacterial infection, unspecified: Secondary | ICD-10-CM | POA: Diagnosis not present

## 2011-09-28 DIAGNOSIS — N189 Chronic kidney disease, unspecified: Secondary | ICD-10-CM | POA: Diagnosis not present

## 2011-09-28 DIAGNOSIS — S88119A Complete traumatic amputation at level between knee and ankle, unspecified lower leg, initial encounter: Secondary | ICD-10-CM | POA: Diagnosis not present

## 2011-09-28 DIAGNOSIS — Z8672 Personal history of thrombophlebitis: Secondary | ICD-10-CM | POA: Diagnosis not present

## 2011-09-28 DIAGNOSIS — I739 Peripheral vascular disease, unspecified: Secondary | ICD-10-CM | POA: Diagnosis not present

## 2011-09-28 DIAGNOSIS — I129 Hypertensive chronic kidney disease with stage 1 through stage 4 chronic kidney disease, or unspecified chronic kidney disease: Secondary | ICD-10-CM | POA: Diagnosis not present

## 2011-09-28 DIAGNOSIS — G4733 Obstructive sleep apnea (adult) (pediatric): Secondary | ICD-10-CM | POA: Diagnosis not present

## 2011-09-28 DIAGNOSIS — E119 Type 2 diabetes mellitus without complications: Secondary | ICD-10-CM | POA: Diagnosis not present

## 2011-09-28 DIAGNOSIS — T8789 Other complications of amputation stump: Secondary | ICD-10-CM | POA: Diagnosis not present

## 2011-09-29 DIAGNOSIS — I739 Peripheral vascular disease, unspecified: Secondary | ICD-10-CM | POA: Diagnosis not present

## 2011-09-29 DIAGNOSIS — N390 Urinary tract infection, site not specified: Secondary | ICD-10-CM | POA: Diagnosis not present

## 2011-09-29 DIAGNOSIS — I82409 Acute embolism and thrombosis of unspecified deep veins of unspecified lower extremity: Secondary | ICD-10-CM | POA: Diagnosis not present

## 2011-09-29 DIAGNOSIS — E119 Type 2 diabetes mellitus without complications: Secondary | ICD-10-CM | POA: Diagnosis not present

## 2011-09-29 DIAGNOSIS — I129 Hypertensive chronic kidney disease with stage 1 through stage 4 chronic kidney disease, or unspecified chronic kidney disease: Secondary | ICD-10-CM | POA: Diagnosis not present

## 2011-09-29 DIAGNOSIS — T8789 Other complications of amputation stump: Secondary | ICD-10-CM | POA: Diagnosis not present

## 2011-09-29 DIAGNOSIS — A499 Bacterial infection, unspecified: Secondary | ICD-10-CM | POA: Diagnosis not present

## 2011-10-05 DIAGNOSIS — E119 Type 2 diabetes mellitus without complications: Secondary | ICD-10-CM | POA: Diagnosis not present

## 2011-10-05 DIAGNOSIS — T8789 Other complications of amputation stump: Secondary | ICD-10-CM | POA: Diagnosis not present

## 2011-10-05 DIAGNOSIS — A499 Bacterial infection, unspecified: Secondary | ICD-10-CM | POA: Diagnosis not present

## 2011-10-05 DIAGNOSIS — N390 Urinary tract infection, site not specified: Secondary | ICD-10-CM | POA: Diagnosis not present

## 2011-10-05 DIAGNOSIS — I739 Peripheral vascular disease, unspecified: Secondary | ICD-10-CM | POA: Diagnosis not present

## 2011-10-05 DIAGNOSIS — I129 Hypertensive chronic kidney disease with stage 1 through stage 4 chronic kidney disease, or unspecified chronic kidney disease: Secondary | ICD-10-CM | POA: Diagnosis not present

## 2011-10-07 DIAGNOSIS — I739 Peripheral vascular disease, unspecified: Secondary | ICD-10-CM | POA: Diagnosis not present

## 2011-10-07 DIAGNOSIS — I129 Hypertensive chronic kidney disease with stage 1 through stage 4 chronic kidney disease, or unspecified chronic kidney disease: Secondary | ICD-10-CM | POA: Diagnosis not present

## 2011-10-07 DIAGNOSIS — E119 Type 2 diabetes mellitus without complications: Secondary | ICD-10-CM | POA: Diagnosis not present

## 2011-10-07 DIAGNOSIS — N390 Urinary tract infection, site not specified: Secondary | ICD-10-CM | POA: Diagnosis not present

## 2011-10-07 DIAGNOSIS — T8789 Other complications of amputation stump: Secondary | ICD-10-CM | POA: Diagnosis not present

## 2011-10-07 DIAGNOSIS — A499 Bacterial infection, unspecified: Secondary | ICD-10-CM | POA: Diagnosis not present

## 2011-10-08 DIAGNOSIS — N10 Acute tubulo-interstitial nephritis: Secondary | ICD-10-CM | POA: Diagnosis not present

## 2011-10-08 DIAGNOSIS — R109 Unspecified abdominal pain: Secondary | ICD-10-CM | POA: Diagnosis not present

## 2011-10-08 DIAGNOSIS — G4733 Obstructive sleep apnea (adult) (pediatric): Secondary | ICD-10-CM | POA: Diagnosis present

## 2011-10-08 DIAGNOSIS — D696 Thrombocytopenia, unspecified: Secondary | ICD-10-CM | POA: Diagnosis present

## 2011-10-08 DIAGNOSIS — S88119A Complete traumatic amputation at level between knee and ankle, unspecified lower leg, initial encounter: Secondary | ICD-10-CM | POA: Diagnosis not present

## 2011-10-08 DIAGNOSIS — Z96649 Presence of unspecified artificial hip joint: Secondary | ICD-10-CM | POA: Diagnosis not present

## 2011-10-08 DIAGNOSIS — E119 Type 2 diabetes mellitus without complications: Secondary | ICD-10-CM | POA: Diagnosis not present

## 2011-10-08 DIAGNOSIS — D72819 Decreased white blood cell count, unspecified: Secondary | ICD-10-CM | POA: Diagnosis present

## 2011-10-08 DIAGNOSIS — Z794 Long term (current) use of insulin: Secondary | ICD-10-CM | POA: Diagnosis not present

## 2011-10-08 DIAGNOSIS — Z86718 Personal history of other venous thrombosis and embolism: Secondary | ICD-10-CM | POA: Diagnosis not present

## 2011-10-08 DIAGNOSIS — Z833 Family history of diabetes mellitus: Secondary | ICD-10-CM | POA: Diagnosis not present

## 2011-10-08 DIAGNOSIS — G4730 Sleep apnea, unspecified: Secondary | ICD-10-CM | POA: Diagnosis not present

## 2011-10-08 DIAGNOSIS — K8020 Calculus of gallbladder without cholecystitis without obstruction: Secondary | ICD-10-CM | POA: Diagnosis not present

## 2011-10-08 DIAGNOSIS — D61818 Other pancytopenia: Secondary | ICD-10-CM | POA: Diagnosis not present

## 2011-10-08 DIAGNOSIS — D649 Anemia, unspecified: Secondary | ICD-10-CM | POA: Diagnosis present

## 2011-10-08 DIAGNOSIS — N390 Urinary tract infection, site not specified: Secondary | ICD-10-CM | POA: Diagnosis not present

## 2011-10-08 DIAGNOSIS — E039 Hypothyroidism, unspecified: Secondary | ICD-10-CM | POA: Diagnosis present

## 2011-10-08 DIAGNOSIS — Z87891 Personal history of nicotine dependence: Secondary | ICD-10-CM | POA: Diagnosis not present

## 2011-10-08 DIAGNOSIS — N12 Tubulo-interstitial nephritis, not specified as acute or chronic: Secondary | ICD-10-CM | POA: Diagnosis not present

## 2011-10-08 DIAGNOSIS — R945 Abnormal results of liver function studies: Secondary | ICD-10-CM | POA: Diagnosis not present

## 2011-10-08 DIAGNOSIS — E785 Hyperlipidemia, unspecified: Secondary | ICD-10-CM | POA: Diagnosis present

## 2011-10-08 DIAGNOSIS — R748 Abnormal levels of other serum enzymes: Secondary | ICD-10-CM | POA: Diagnosis not present

## 2011-10-08 DIAGNOSIS — R17 Unspecified jaundice: Secondary | ICD-10-CM | POA: Diagnosis not present

## 2011-10-08 DIAGNOSIS — R0602 Shortness of breath: Secondary | ICD-10-CM | POA: Diagnosis not present

## 2011-10-08 DIAGNOSIS — K7689 Other specified diseases of liver: Secondary | ICD-10-CM | POA: Diagnosis present

## 2011-10-08 DIAGNOSIS — K5730 Diverticulosis of large intestine without perforation or abscess without bleeding: Secondary | ICD-10-CM | POA: Diagnosis not present

## 2011-10-08 DIAGNOSIS — N200 Calculus of kidney: Secondary | ICD-10-CM | POA: Diagnosis not present

## 2011-10-08 DIAGNOSIS — I10 Essential (primary) hypertension: Secondary | ICD-10-CM | POA: Diagnosis not present

## 2011-10-08 DIAGNOSIS — I872 Venous insufficiency (chronic) (peripheral): Secondary | ICD-10-CM | POA: Diagnosis present

## 2011-10-08 DIAGNOSIS — T8789 Other complications of amputation stump: Secondary | ICD-10-CM | POA: Diagnosis not present

## 2011-10-08 DIAGNOSIS — Z96659 Presence of unspecified artificial knee joint: Secondary | ICD-10-CM | POA: Diagnosis not present

## 2011-10-08 DIAGNOSIS — R160 Hepatomegaly, not elsewhere classified: Secondary | ICD-10-CM | POA: Diagnosis not present

## 2011-10-08 DIAGNOSIS — R0989 Other specified symptoms and signs involving the circulatory and respiratory systems: Secondary | ICD-10-CM | POA: Diagnosis not present

## 2011-10-08 DIAGNOSIS — R0609 Other forms of dyspnea: Secondary | ICD-10-CM | POA: Diagnosis not present

## 2011-10-08 DIAGNOSIS — A499 Bacterial infection, unspecified: Secondary | ICD-10-CM | POA: Diagnosis not present

## 2011-10-08 DIAGNOSIS — Z6841 Body Mass Index (BMI) 40.0 and over, adult: Secondary | ICD-10-CM | POA: Diagnosis not present

## 2011-10-08 DIAGNOSIS — Z7901 Long term (current) use of anticoagulants: Secondary | ICD-10-CM | POA: Diagnosis not present

## 2011-10-08 DIAGNOSIS — I739 Peripheral vascular disease, unspecified: Secondary | ICD-10-CM | POA: Diagnosis not present

## 2011-10-08 DIAGNOSIS — I129 Hypertensive chronic kidney disease with stage 1 through stage 4 chronic kidney disease, or unspecified chronic kidney disease: Secondary | ICD-10-CM | POA: Diagnosis not present

## 2011-10-12 DIAGNOSIS — I129 Hypertensive chronic kidney disease with stage 1 through stage 4 chronic kidney disease, or unspecified chronic kidney disease: Secondary | ICD-10-CM | POA: Diagnosis not present

## 2011-10-12 DIAGNOSIS — A499 Bacterial infection, unspecified: Secondary | ICD-10-CM | POA: Diagnosis not present

## 2011-10-12 DIAGNOSIS — I739 Peripheral vascular disease, unspecified: Secondary | ICD-10-CM | POA: Diagnosis not present

## 2011-10-12 DIAGNOSIS — N390 Urinary tract infection, site not specified: Secondary | ICD-10-CM | POA: Diagnosis not present

## 2011-10-12 DIAGNOSIS — T8789 Other complications of amputation stump: Secondary | ICD-10-CM | POA: Diagnosis not present

## 2011-10-12 DIAGNOSIS — E119 Type 2 diabetes mellitus without complications: Secondary | ICD-10-CM | POA: Diagnosis not present

## 2011-10-14 DIAGNOSIS — N390 Urinary tract infection, site not specified: Secondary | ICD-10-CM | POA: Diagnosis not present

## 2011-10-14 DIAGNOSIS — A499 Bacterial infection, unspecified: Secondary | ICD-10-CM | POA: Diagnosis not present

## 2011-10-14 DIAGNOSIS — I739 Peripheral vascular disease, unspecified: Secondary | ICD-10-CM | POA: Diagnosis not present

## 2011-10-14 DIAGNOSIS — T8789 Other complications of amputation stump: Secondary | ICD-10-CM | POA: Diagnosis not present

## 2011-10-14 DIAGNOSIS — I129 Hypertensive chronic kidney disease with stage 1 through stage 4 chronic kidney disease, or unspecified chronic kidney disease: Secondary | ICD-10-CM | POA: Diagnosis not present

## 2011-10-14 DIAGNOSIS — E119 Type 2 diabetes mellitus without complications: Secondary | ICD-10-CM | POA: Diagnosis not present

## 2011-10-17 DIAGNOSIS — A499 Bacterial infection, unspecified: Secondary | ICD-10-CM | POA: Diagnosis not present

## 2011-10-17 DIAGNOSIS — I129 Hypertensive chronic kidney disease with stage 1 through stage 4 chronic kidney disease, or unspecified chronic kidney disease: Secondary | ICD-10-CM | POA: Diagnosis not present

## 2011-10-17 DIAGNOSIS — E119 Type 2 diabetes mellitus without complications: Secondary | ICD-10-CM | POA: Diagnosis not present

## 2011-10-17 DIAGNOSIS — N390 Urinary tract infection, site not specified: Secondary | ICD-10-CM | POA: Diagnosis not present

## 2011-10-17 DIAGNOSIS — I739 Peripheral vascular disease, unspecified: Secondary | ICD-10-CM | POA: Diagnosis not present

## 2011-10-17 DIAGNOSIS — T8789 Other complications of amputation stump: Secondary | ICD-10-CM | POA: Diagnosis not present

## 2011-10-19 DIAGNOSIS — I739 Peripheral vascular disease, unspecified: Secondary | ICD-10-CM | POA: Diagnosis not present

## 2011-10-19 DIAGNOSIS — T8789 Other complications of amputation stump: Secondary | ICD-10-CM | POA: Diagnosis not present

## 2011-10-19 DIAGNOSIS — E119 Type 2 diabetes mellitus without complications: Secondary | ICD-10-CM | POA: Diagnosis not present

## 2011-10-19 DIAGNOSIS — N390 Urinary tract infection, site not specified: Secondary | ICD-10-CM | POA: Diagnosis not present

## 2011-10-19 DIAGNOSIS — I129 Hypertensive chronic kidney disease with stage 1 through stage 4 chronic kidney disease, or unspecified chronic kidney disease: Secondary | ICD-10-CM | POA: Diagnosis not present

## 2011-10-19 DIAGNOSIS — A499 Bacterial infection, unspecified: Secondary | ICD-10-CM | POA: Diagnosis not present

## 2011-10-21 DIAGNOSIS — E119 Type 2 diabetes mellitus without complications: Secondary | ICD-10-CM | POA: Diagnosis not present

## 2011-10-21 DIAGNOSIS — T8789 Other complications of amputation stump: Secondary | ICD-10-CM | POA: Diagnosis not present

## 2011-10-21 DIAGNOSIS — A499 Bacterial infection, unspecified: Secondary | ICD-10-CM | POA: Diagnosis not present

## 2011-10-21 DIAGNOSIS — I129 Hypertensive chronic kidney disease with stage 1 through stage 4 chronic kidney disease, or unspecified chronic kidney disease: Secondary | ICD-10-CM | POA: Diagnosis not present

## 2011-10-21 DIAGNOSIS — N390 Urinary tract infection, site not specified: Secondary | ICD-10-CM | POA: Diagnosis not present

## 2011-10-21 DIAGNOSIS — I739 Peripheral vascular disease, unspecified: Secondary | ICD-10-CM | POA: Diagnosis not present

## 2011-10-23 DIAGNOSIS — T8789 Other complications of amputation stump: Secondary | ICD-10-CM | POA: Diagnosis not present

## 2011-10-23 DIAGNOSIS — I129 Hypertensive chronic kidney disease with stage 1 through stage 4 chronic kidney disease, or unspecified chronic kidney disease: Secondary | ICD-10-CM | POA: Diagnosis not present

## 2011-10-23 DIAGNOSIS — A499 Bacterial infection, unspecified: Secondary | ICD-10-CM | POA: Diagnosis not present

## 2011-10-23 DIAGNOSIS — E119 Type 2 diabetes mellitus without complications: Secondary | ICD-10-CM | POA: Diagnosis not present

## 2011-10-23 DIAGNOSIS — N390 Urinary tract infection, site not specified: Secondary | ICD-10-CM | POA: Diagnosis not present

## 2011-10-23 DIAGNOSIS — I739 Peripheral vascular disease, unspecified: Secondary | ICD-10-CM | POA: Diagnosis not present

## 2011-10-24 DIAGNOSIS — I82409 Acute embolism and thrombosis of unspecified deep veins of unspecified lower extremity: Secondary | ICD-10-CM | POA: Diagnosis not present

## 2011-10-24 DIAGNOSIS — N400 Enlarged prostate without lower urinary tract symptoms: Secondary | ICD-10-CM | POA: Diagnosis not present

## 2011-10-24 DIAGNOSIS — I10 Essential (primary) hypertension: Secondary | ICD-10-CM | POA: Diagnosis not present

## 2011-10-24 DIAGNOSIS — E785 Hyperlipidemia, unspecified: Secondary | ICD-10-CM | POA: Diagnosis not present

## 2011-10-24 DIAGNOSIS — E039 Hypothyroidism, unspecified: Secondary | ICD-10-CM | POA: Diagnosis not present

## 2011-10-24 DIAGNOSIS — E119 Type 2 diabetes mellitus without complications: Secondary | ICD-10-CM | POA: Diagnosis not present

## 2011-10-24 DIAGNOSIS — R300 Dysuria: Secondary | ICD-10-CM | POA: Diagnosis not present

## 2011-10-25 DIAGNOSIS — L97209 Non-pressure chronic ulcer of unspecified calf with unspecified severity: Secondary | ICD-10-CM | POA: Diagnosis not present

## 2011-10-27 DIAGNOSIS — I82409 Acute embolism and thrombosis of unspecified deep veins of unspecified lower extremity: Secondary | ICD-10-CM | POA: Diagnosis not present

## 2011-10-31 DIAGNOSIS — E119 Type 2 diabetes mellitus without complications: Secondary | ICD-10-CM | POA: Diagnosis not present

## 2011-10-31 DIAGNOSIS — N390 Urinary tract infection, site not specified: Secondary | ICD-10-CM | POA: Diagnosis not present

## 2011-10-31 DIAGNOSIS — A499 Bacterial infection, unspecified: Secondary | ICD-10-CM | POA: Diagnosis not present

## 2011-10-31 DIAGNOSIS — T8789 Other complications of amputation stump: Secondary | ICD-10-CM | POA: Diagnosis not present

## 2011-10-31 DIAGNOSIS — I129 Hypertensive chronic kidney disease with stage 1 through stage 4 chronic kidney disease, or unspecified chronic kidney disease: Secondary | ICD-10-CM | POA: Diagnosis not present

## 2011-10-31 DIAGNOSIS — I739 Peripheral vascular disease, unspecified: Secondary | ICD-10-CM | POA: Diagnosis not present

## 2011-11-02 DIAGNOSIS — L03119 Cellulitis of unspecified part of limb: Secondary | ICD-10-CM | POA: Diagnosis not present

## 2011-11-02 DIAGNOSIS — L02419 Cutaneous abscess of limb, unspecified: Secondary | ICD-10-CM | POA: Diagnosis not present

## 2011-11-02 DIAGNOSIS — L97209 Non-pressure chronic ulcer of unspecified calf with unspecified severity: Secondary | ICD-10-CM | POA: Diagnosis not present

## 2011-11-09 DIAGNOSIS — L97209 Non-pressure chronic ulcer of unspecified calf with unspecified severity: Secondary | ICD-10-CM | POA: Diagnosis not present

## 2011-11-09 DIAGNOSIS — L03119 Cellulitis of unspecified part of limb: Secondary | ICD-10-CM | POA: Diagnosis not present

## 2011-11-09 DIAGNOSIS — L97929 Non-pressure chronic ulcer of unspecified part of left lower leg with unspecified severity: Secondary | ICD-10-CM | POA: Diagnosis not present

## 2011-11-09 DIAGNOSIS — I83229 Varicose veins of left lower extremity with both ulcer of unspecified site and inflammation: Secondary | ICD-10-CM | POA: Diagnosis not present

## 2011-11-09 DIAGNOSIS — L97409 Non-pressure chronic ulcer of unspecified heel and midfoot with unspecified severity: Secondary | ICD-10-CM | POA: Diagnosis not present

## 2011-11-09 DIAGNOSIS — L02419 Cutaneous abscess of limb, unspecified: Secondary | ICD-10-CM | POA: Diagnosis not present

## 2011-11-10 DIAGNOSIS — L03119 Cellulitis of unspecified part of limb: Secondary | ICD-10-CM | POA: Diagnosis not present

## 2011-11-10 DIAGNOSIS — L02419 Cutaneous abscess of limb, unspecified: Secondary | ICD-10-CM | POA: Diagnosis not present

## 2011-11-10 DIAGNOSIS — L97209 Non-pressure chronic ulcer of unspecified calf with unspecified severity: Secondary | ICD-10-CM | POA: Diagnosis not present

## 2011-11-15 DIAGNOSIS — A499 Bacterial infection, unspecified: Secondary | ICD-10-CM | POA: Diagnosis not present

## 2011-11-15 DIAGNOSIS — I739 Peripheral vascular disease, unspecified: Secondary | ICD-10-CM | POA: Diagnosis not present

## 2011-11-15 DIAGNOSIS — N390 Urinary tract infection, site not specified: Secondary | ICD-10-CM | POA: Diagnosis not present

## 2011-11-15 DIAGNOSIS — I129 Hypertensive chronic kidney disease with stage 1 through stage 4 chronic kidney disease, or unspecified chronic kidney disease: Secondary | ICD-10-CM | POA: Diagnosis not present

## 2011-11-15 DIAGNOSIS — E119 Type 2 diabetes mellitus without complications: Secondary | ICD-10-CM | POA: Diagnosis not present

## 2011-11-15 DIAGNOSIS — T8789 Other complications of amputation stump: Secondary | ICD-10-CM | POA: Diagnosis not present

## 2011-11-17 DIAGNOSIS — Z23 Encounter for immunization: Secondary | ICD-10-CM | POA: Diagnosis not present

## 2011-11-22 DIAGNOSIS — A499 Bacterial infection, unspecified: Secondary | ICD-10-CM | POA: Diagnosis not present

## 2011-11-22 DIAGNOSIS — E119 Type 2 diabetes mellitus without complications: Secondary | ICD-10-CM | POA: Diagnosis not present

## 2011-11-22 DIAGNOSIS — I739 Peripheral vascular disease, unspecified: Secondary | ICD-10-CM | POA: Diagnosis not present

## 2011-11-22 DIAGNOSIS — N390 Urinary tract infection, site not specified: Secondary | ICD-10-CM | POA: Diagnosis not present

## 2011-11-22 DIAGNOSIS — I129 Hypertensive chronic kidney disease with stage 1 through stage 4 chronic kidney disease, or unspecified chronic kidney disease: Secondary | ICD-10-CM | POA: Diagnosis not present

## 2011-11-22 DIAGNOSIS — T8789 Other complications of amputation stump: Secondary | ICD-10-CM | POA: Diagnosis not present

## 2011-11-24 DIAGNOSIS — I82409 Acute embolism and thrombosis of unspecified deep veins of unspecified lower extremity: Secondary | ICD-10-CM | POA: Diagnosis not present

## 2011-11-27 DIAGNOSIS — I129 Hypertensive chronic kidney disease with stage 1 through stage 4 chronic kidney disease, or unspecified chronic kidney disease: Secondary | ICD-10-CM | POA: Diagnosis not present

## 2011-11-27 DIAGNOSIS — N189 Chronic kidney disease, unspecified: Secondary | ICD-10-CM | POA: Diagnosis not present

## 2011-11-27 DIAGNOSIS — T8789 Other complications of amputation stump: Secondary | ICD-10-CM | POA: Diagnosis not present

## 2011-11-27 DIAGNOSIS — T8189XA Other complications of procedures, not elsewhere classified, initial encounter: Secondary | ICD-10-CM | POA: Diagnosis not present

## 2011-11-27 DIAGNOSIS — E119 Type 2 diabetes mellitus without complications: Secondary | ICD-10-CM | POA: Diagnosis not present

## 2011-11-27 DIAGNOSIS — I739 Peripheral vascular disease, unspecified: Secondary | ICD-10-CM | POA: Diagnosis not present

## 2011-11-29 DIAGNOSIS — I129 Hypertensive chronic kidney disease with stage 1 through stage 4 chronic kidney disease, or unspecified chronic kidney disease: Secondary | ICD-10-CM | POA: Diagnosis not present

## 2011-11-29 DIAGNOSIS — T8189XA Other complications of procedures, not elsewhere classified, initial encounter: Secondary | ICD-10-CM | POA: Diagnosis not present

## 2011-11-29 DIAGNOSIS — T8789 Other complications of amputation stump: Secondary | ICD-10-CM | POA: Diagnosis not present

## 2011-11-29 DIAGNOSIS — E119 Type 2 diabetes mellitus without complications: Secondary | ICD-10-CM | POA: Diagnosis not present

## 2011-11-29 DIAGNOSIS — N189 Chronic kidney disease, unspecified: Secondary | ICD-10-CM | POA: Diagnosis not present

## 2011-11-29 DIAGNOSIS — I739 Peripheral vascular disease, unspecified: Secondary | ICD-10-CM | POA: Diagnosis not present

## 2011-12-01 DIAGNOSIS — I739 Peripheral vascular disease, unspecified: Secondary | ICD-10-CM | POA: Diagnosis not present

## 2011-12-01 DIAGNOSIS — T8789 Other complications of amputation stump: Secondary | ICD-10-CM | POA: Diagnosis not present

## 2011-12-01 DIAGNOSIS — N189 Chronic kidney disease, unspecified: Secondary | ICD-10-CM | POA: Diagnosis not present

## 2011-12-01 DIAGNOSIS — I129 Hypertensive chronic kidney disease with stage 1 through stage 4 chronic kidney disease, or unspecified chronic kidney disease: Secondary | ICD-10-CM | POA: Diagnosis not present

## 2011-12-01 DIAGNOSIS — T8189XA Other complications of procedures, not elsewhere classified, initial encounter: Secondary | ICD-10-CM | POA: Diagnosis not present

## 2011-12-01 DIAGNOSIS — E119 Type 2 diabetes mellitus without complications: Secondary | ICD-10-CM | POA: Diagnosis not present

## 2011-12-06 DIAGNOSIS — I129 Hypertensive chronic kidney disease with stage 1 through stage 4 chronic kidney disease, or unspecified chronic kidney disease: Secondary | ICD-10-CM | POA: Diagnosis not present

## 2011-12-06 DIAGNOSIS — I739 Peripheral vascular disease, unspecified: Secondary | ICD-10-CM | POA: Diagnosis not present

## 2011-12-06 DIAGNOSIS — E119 Type 2 diabetes mellitus without complications: Secondary | ICD-10-CM | POA: Diagnosis not present

## 2011-12-06 DIAGNOSIS — N189 Chronic kidney disease, unspecified: Secondary | ICD-10-CM | POA: Diagnosis not present

## 2011-12-06 DIAGNOSIS — T8789 Other complications of amputation stump: Secondary | ICD-10-CM | POA: Diagnosis not present

## 2011-12-06 DIAGNOSIS — T8189XA Other complications of procedures, not elsewhere classified, initial encounter: Secondary | ICD-10-CM | POA: Diagnosis not present

## 2011-12-14 DIAGNOSIS — T8189XA Other complications of procedures, not elsewhere classified, initial encounter: Secondary | ICD-10-CM | POA: Diagnosis not present

## 2011-12-14 DIAGNOSIS — E119 Type 2 diabetes mellitus without complications: Secondary | ICD-10-CM | POA: Diagnosis not present

## 2011-12-14 DIAGNOSIS — N189 Chronic kidney disease, unspecified: Secondary | ICD-10-CM | POA: Diagnosis not present

## 2011-12-14 DIAGNOSIS — I739 Peripheral vascular disease, unspecified: Secondary | ICD-10-CM | POA: Diagnosis not present

## 2011-12-14 DIAGNOSIS — I129 Hypertensive chronic kidney disease with stage 1 through stage 4 chronic kidney disease, or unspecified chronic kidney disease: Secondary | ICD-10-CM | POA: Diagnosis not present

## 2011-12-14 DIAGNOSIS — T8789 Other complications of amputation stump: Secondary | ICD-10-CM | POA: Diagnosis not present

## 2011-12-20 DIAGNOSIS — T8189XA Other complications of procedures, not elsewhere classified, initial encounter: Secondary | ICD-10-CM | POA: Diagnosis not present

## 2011-12-20 DIAGNOSIS — N189 Chronic kidney disease, unspecified: Secondary | ICD-10-CM | POA: Diagnosis not present

## 2011-12-20 DIAGNOSIS — I739 Peripheral vascular disease, unspecified: Secondary | ICD-10-CM | POA: Diagnosis not present

## 2011-12-20 DIAGNOSIS — I129 Hypertensive chronic kidney disease with stage 1 through stage 4 chronic kidney disease, or unspecified chronic kidney disease: Secondary | ICD-10-CM | POA: Diagnosis not present

## 2011-12-20 DIAGNOSIS — T8789 Other complications of amputation stump: Secondary | ICD-10-CM | POA: Diagnosis not present

## 2011-12-20 DIAGNOSIS — E119 Type 2 diabetes mellitus without complications: Secondary | ICD-10-CM | POA: Diagnosis not present

## 2011-12-21 DIAGNOSIS — L97919 Non-pressure chronic ulcer of unspecified part of right lower leg with unspecified severity: Secondary | ICD-10-CM | POA: Diagnosis not present

## 2011-12-21 DIAGNOSIS — I83229 Varicose veins of left lower extremity with both ulcer of unspecified site and inflammation: Secondary | ICD-10-CM | POA: Diagnosis not present

## 2011-12-21 DIAGNOSIS — L97209 Non-pressure chronic ulcer of unspecified calf with unspecified severity: Secondary | ICD-10-CM | POA: Diagnosis not present

## 2011-12-21 DIAGNOSIS — I83219 Varicose veins of right lower extremity with both ulcer of unspecified site and inflammation: Secondary | ICD-10-CM | POA: Diagnosis not present

## 2011-12-21 DIAGNOSIS — L02419 Cutaneous abscess of limb, unspecified: Secondary | ICD-10-CM | POA: Diagnosis not present

## 2011-12-26 DIAGNOSIS — I10 Essential (primary) hypertension: Secondary | ICD-10-CM | POA: Diagnosis not present

## 2011-12-26 DIAGNOSIS — R319 Hematuria, unspecified: Secondary | ICD-10-CM | POA: Diagnosis not present

## 2011-12-26 DIAGNOSIS — N390 Urinary tract infection, site not specified: Secondary | ICD-10-CM | POA: Diagnosis not present

## 2011-12-26 DIAGNOSIS — E785 Hyperlipidemia, unspecified: Secondary | ICD-10-CM | POA: Diagnosis not present

## 2011-12-26 DIAGNOSIS — E119 Type 2 diabetes mellitus without complications: Secondary | ICD-10-CM | POA: Diagnosis not present

## 2011-12-26 DIAGNOSIS — R300 Dysuria: Secondary | ICD-10-CM | POA: Diagnosis not present

## 2011-12-27 DIAGNOSIS — N189 Chronic kidney disease, unspecified: Secondary | ICD-10-CM | POA: Diagnosis not present

## 2011-12-27 DIAGNOSIS — I739 Peripheral vascular disease, unspecified: Secondary | ICD-10-CM | POA: Diagnosis not present

## 2011-12-27 DIAGNOSIS — T8189XA Other complications of procedures, not elsewhere classified, initial encounter: Secondary | ICD-10-CM | POA: Diagnosis not present

## 2011-12-27 DIAGNOSIS — T8789 Other complications of amputation stump: Secondary | ICD-10-CM | POA: Diagnosis not present

## 2011-12-27 DIAGNOSIS — E119 Type 2 diabetes mellitus without complications: Secondary | ICD-10-CM | POA: Diagnosis not present

## 2011-12-27 DIAGNOSIS — I129 Hypertensive chronic kidney disease with stage 1 through stage 4 chronic kidney disease, or unspecified chronic kidney disease: Secondary | ICD-10-CM | POA: Diagnosis not present

## 2011-12-28 DIAGNOSIS — L97929 Non-pressure chronic ulcer of unspecified part of left lower leg with unspecified severity: Secondary | ICD-10-CM | POA: Diagnosis not present

## 2011-12-28 DIAGNOSIS — L02619 Cutaneous abscess of unspecified foot: Secondary | ICD-10-CM | POA: Diagnosis not present

## 2011-12-28 DIAGNOSIS — L97209 Non-pressure chronic ulcer of unspecified calf with unspecified severity: Secondary | ICD-10-CM | POA: Diagnosis not present

## 2011-12-28 DIAGNOSIS — I83229 Varicose veins of left lower extremity with both ulcer of unspecified site and inflammation: Secondary | ICD-10-CM | POA: Diagnosis not present

## 2011-12-28 DIAGNOSIS — L03119 Cellulitis of unspecified part of limb: Secondary | ICD-10-CM | POA: Diagnosis not present

## 2011-12-30 DIAGNOSIS — I82409 Acute embolism and thrombosis of unspecified deep veins of unspecified lower extremity: Secondary | ICD-10-CM | POA: Diagnosis not present

## 2012-01-02 DIAGNOSIS — N200 Calculus of kidney: Secondary | ICD-10-CM | POA: Diagnosis not present

## 2012-01-02 DIAGNOSIS — N390 Urinary tract infection, site not specified: Secondary | ICD-10-CM | POA: Diagnosis not present

## 2012-01-03 DIAGNOSIS — I739 Peripheral vascular disease, unspecified: Secondary | ICD-10-CM | POA: Diagnosis not present

## 2012-01-03 DIAGNOSIS — T8789 Other complications of amputation stump: Secondary | ICD-10-CM | POA: Diagnosis not present

## 2012-01-03 DIAGNOSIS — T8189XA Other complications of procedures, not elsewhere classified, initial encounter: Secondary | ICD-10-CM | POA: Diagnosis not present

## 2012-01-03 DIAGNOSIS — E119 Type 2 diabetes mellitus without complications: Secondary | ICD-10-CM | POA: Diagnosis not present

## 2012-01-03 DIAGNOSIS — N189 Chronic kidney disease, unspecified: Secondary | ICD-10-CM | POA: Diagnosis not present

## 2012-01-03 DIAGNOSIS — I129 Hypertensive chronic kidney disease with stage 1 through stage 4 chronic kidney disease, or unspecified chronic kidney disease: Secondary | ICD-10-CM | POA: Diagnosis not present

## 2012-01-05 DIAGNOSIS — R3129 Other microscopic hematuria: Secondary | ICD-10-CM | POA: Diagnosis not present

## 2012-01-05 DIAGNOSIS — R3915 Urgency of urination: Secondary | ICD-10-CM | POA: Diagnosis not present

## 2012-01-05 DIAGNOSIS — N200 Calculus of kidney: Secondary | ICD-10-CM | POA: Diagnosis not present

## 2012-01-05 DIAGNOSIS — K8020 Calculus of gallbladder without cholecystitis without obstruction: Secondary | ICD-10-CM | POA: Diagnosis not present

## 2012-01-05 DIAGNOSIS — N401 Enlarged prostate with lower urinary tract symptoms: Secondary | ICD-10-CM | POA: Diagnosis not present

## 2012-01-05 DIAGNOSIS — N139 Obstructive and reflux uropathy, unspecified: Secondary | ICD-10-CM | POA: Diagnosis not present

## 2012-01-05 DIAGNOSIS — R300 Dysuria: Secondary | ICD-10-CM | POA: Diagnosis not present

## 2012-01-08 DIAGNOSIS — D649 Anemia, unspecified: Secondary | ICD-10-CM | POA: Diagnosis not present

## 2012-01-10 DIAGNOSIS — N189 Chronic kidney disease, unspecified: Secondary | ICD-10-CM | POA: Diagnosis not present

## 2012-01-10 DIAGNOSIS — E119 Type 2 diabetes mellitus without complications: Secondary | ICD-10-CM | POA: Diagnosis not present

## 2012-01-10 DIAGNOSIS — T8789 Other complications of amputation stump: Secondary | ICD-10-CM | POA: Diagnosis not present

## 2012-01-10 DIAGNOSIS — I129 Hypertensive chronic kidney disease with stage 1 through stage 4 chronic kidney disease, or unspecified chronic kidney disease: Secondary | ICD-10-CM | POA: Diagnosis not present

## 2012-01-10 DIAGNOSIS — T8189XA Other complications of procedures, not elsewhere classified, initial encounter: Secondary | ICD-10-CM | POA: Diagnosis not present

## 2012-01-10 DIAGNOSIS — I739 Peripheral vascular disease, unspecified: Secondary | ICD-10-CM | POA: Diagnosis not present

## 2012-01-11 DIAGNOSIS — I798 Other disorders of arteries, arterioles and capillaries in diseases classified elsewhere: Secondary | ICD-10-CM | POA: Diagnosis present

## 2012-01-11 DIAGNOSIS — Z452 Encounter for adjustment and management of vascular access device: Secondary | ICD-10-CM | POA: Diagnosis not present

## 2012-01-11 DIAGNOSIS — R110 Nausea: Secondary | ICD-10-CM | POA: Diagnosis not present

## 2012-01-11 DIAGNOSIS — N179 Acute kidney failure, unspecified: Secondary | ICD-10-CM | POA: Diagnosis present

## 2012-01-11 DIAGNOSIS — Z86718 Personal history of other venous thrombosis and embolism: Secondary | ICD-10-CM | POA: Diagnosis not present

## 2012-01-11 DIAGNOSIS — I10 Essential (primary) hypertension: Secondary | ICD-10-CM | POA: Diagnosis present

## 2012-01-11 DIAGNOSIS — R109 Unspecified abdominal pain: Secondary | ICD-10-CM | POA: Diagnosis not present

## 2012-01-11 DIAGNOSIS — R1084 Generalized abdominal pain: Secondary | ICD-10-CM | POA: Diagnosis not present

## 2012-01-11 DIAGNOSIS — N390 Urinary tract infection, site not specified: Secondary | ICD-10-CM | POA: Diagnosis not present

## 2012-01-11 DIAGNOSIS — K859 Acute pancreatitis without necrosis or infection, unspecified: Secondary | ICD-10-CM | POA: Diagnosis not present

## 2012-01-11 DIAGNOSIS — E785 Hyperlipidemia, unspecified: Secondary | ICD-10-CM | POA: Diagnosis not present

## 2012-01-11 DIAGNOSIS — G4730 Sleep apnea, unspecified: Secondary | ICD-10-CM | POA: Diagnosis present

## 2012-01-11 DIAGNOSIS — K439 Ventral hernia without obstruction or gangrene: Secondary | ICD-10-CM | POA: Diagnosis not present

## 2012-01-11 DIAGNOSIS — N200 Calculus of kidney: Secondary | ICD-10-CM | POA: Diagnosis not present

## 2012-01-11 DIAGNOSIS — R599 Enlarged lymph nodes, unspecified: Secondary | ICD-10-CM | POA: Diagnosis not present

## 2012-01-11 DIAGNOSIS — E119 Type 2 diabetes mellitus without complications: Secondary | ICD-10-CM | POA: Diagnosis not present

## 2012-01-11 DIAGNOSIS — A419 Sepsis, unspecified organism: Secondary | ICD-10-CM | POA: Diagnosis not present

## 2012-01-11 DIAGNOSIS — R300 Dysuria: Secondary | ICD-10-CM | POA: Diagnosis not present

## 2012-01-11 DIAGNOSIS — E1159 Type 2 diabetes mellitus with other circulatory complications: Secondary | ICD-10-CM | POA: Diagnosis not present

## 2012-01-11 DIAGNOSIS — E039 Hypothyroidism, unspecified: Secondary | ICD-10-CM | POA: Diagnosis present

## 2012-01-11 DIAGNOSIS — B9689 Other specified bacterial agents as the cause of diseases classified elsewhere: Secondary | ICD-10-CM | POA: Diagnosis not present

## 2012-01-18 DIAGNOSIS — I739 Peripheral vascular disease, unspecified: Secondary | ICD-10-CM | POA: Diagnosis not present

## 2012-01-18 DIAGNOSIS — I129 Hypertensive chronic kidney disease with stage 1 through stage 4 chronic kidney disease, or unspecified chronic kidney disease: Secondary | ICD-10-CM | POA: Diagnosis not present

## 2012-01-18 DIAGNOSIS — T8189XA Other complications of procedures, not elsewhere classified, initial encounter: Secondary | ICD-10-CM | POA: Diagnosis not present

## 2012-01-18 DIAGNOSIS — N189 Chronic kidney disease, unspecified: Secondary | ICD-10-CM | POA: Diagnosis not present

## 2012-01-18 DIAGNOSIS — E119 Type 2 diabetes mellitus without complications: Secondary | ICD-10-CM | POA: Diagnosis not present

## 2012-01-18 DIAGNOSIS — T8789 Other complications of amputation stump: Secondary | ICD-10-CM | POA: Diagnosis not present

## 2012-01-19 DIAGNOSIS — I739 Peripheral vascular disease, unspecified: Secondary | ICD-10-CM | POA: Diagnosis not present

## 2012-01-19 DIAGNOSIS — D649 Anemia, unspecified: Secondary | ICD-10-CM | POA: Diagnosis not present

## 2012-01-19 DIAGNOSIS — I129 Hypertensive chronic kidney disease with stage 1 through stage 4 chronic kidney disease, or unspecified chronic kidney disease: Secondary | ICD-10-CM | POA: Diagnosis not present

## 2012-01-19 DIAGNOSIS — E119 Type 2 diabetes mellitus without complications: Secondary | ICD-10-CM | POA: Diagnosis not present

## 2012-01-19 DIAGNOSIS — I10 Essential (primary) hypertension: Secondary | ICD-10-CM | POA: Diagnosis not present

## 2012-01-19 DIAGNOSIS — T8189XA Other complications of procedures, not elsewhere classified, initial encounter: Secondary | ICD-10-CM | POA: Diagnosis not present

## 2012-01-19 DIAGNOSIS — N189 Chronic kidney disease, unspecified: Secondary | ICD-10-CM | POA: Diagnosis not present

## 2012-01-19 DIAGNOSIS — T8789 Other complications of amputation stump: Secondary | ICD-10-CM | POA: Diagnosis not present

## 2012-01-19 DIAGNOSIS — N400 Enlarged prostate without lower urinary tract symptoms: Secondary | ICD-10-CM | POA: Diagnosis not present

## 2012-01-22 DIAGNOSIS — I129 Hypertensive chronic kidney disease with stage 1 through stage 4 chronic kidney disease, or unspecified chronic kidney disease: Secondary | ICD-10-CM | POA: Diagnosis not present

## 2012-01-22 DIAGNOSIS — I739 Peripheral vascular disease, unspecified: Secondary | ICD-10-CM | POA: Diagnosis not present

## 2012-01-22 DIAGNOSIS — T8189XA Other complications of procedures, not elsewhere classified, initial encounter: Secondary | ICD-10-CM | POA: Diagnosis not present

## 2012-01-22 DIAGNOSIS — N189 Chronic kidney disease, unspecified: Secondary | ICD-10-CM | POA: Diagnosis not present

## 2012-01-22 DIAGNOSIS — E119 Type 2 diabetes mellitus without complications: Secondary | ICD-10-CM | POA: Diagnosis not present

## 2012-01-22 DIAGNOSIS — T8789 Other complications of amputation stump: Secondary | ICD-10-CM | POA: Diagnosis not present

## 2012-01-22 DIAGNOSIS — Z5181 Encounter for therapeutic drug level monitoring: Secondary | ICD-10-CM | POA: Diagnosis not present

## 2012-01-24 DIAGNOSIS — E119 Type 2 diabetes mellitus without complications: Secondary | ICD-10-CM | POA: Diagnosis not present

## 2012-01-24 DIAGNOSIS — I739 Peripheral vascular disease, unspecified: Secondary | ICD-10-CM | POA: Diagnosis not present

## 2012-01-24 DIAGNOSIS — I129 Hypertensive chronic kidney disease with stage 1 through stage 4 chronic kidney disease, or unspecified chronic kidney disease: Secondary | ICD-10-CM | POA: Diagnosis not present

## 2012-01-24 DIAGNOSIS — N189 Chronic kidney disease, unspecified: Secondary | ICD-10-CM | POA: Diagnosis not present

## 2012-01-24 DIAGNOSIS — T8789 Other complications of amputation stump: Secondary | ICD-10-CM | POA: Diagnosis not present

## 2012-01-24 DIAGNOSIS — R300 Dysuria: Secondary | ICD-10-CM | POA: Diagnosis not present

## 2012-01-24 DIAGNOSIS — R599 Enlarged lymph nodes, unspecified: Secondary | ICD-10-CM | POA: Diagnosis not present

## 2012-01-24 DIAGNOSIS — T8189XA Other complications of procedures, not elsewhere classified, initial encounter: Secondary | ICD-10-CM | POA: Diagnosis not present

## 2012-01-26 DIAGNOSIS — T8189XA Other complications of procedures, not elsewhere classified, initial encounter: Secondary | ICD-10-CM | POA: Diagnosis not present

## 2012-01-26 DIAGNOSIS — A419 Sepsis, unspecified organism: Secondary | ICD-10-CM | POA: Diagnosis not present

## 2012-01-26 DIAGNOSIS — E1059 Type 1 diabetes mellitus with other circulatory complications: Secondary | ICD-10-CM | POA: Diagnosis not present

## 2012-01-26 DIAGNOSIS — N390 Urinary tract infection, site not specified: Secondary | ICD-10-CM | POA: Diagnosis not present

## 2012-01-26 DIAGNOSIS — T8789 Other complications of amputation stump: Secondary | ICD-10-CM | POA: Diagnosis not present

## 2012-01-29 DIAGNOSIS — L97209 Non-pressure chronic ulcer of unspecified calf with unspecified severity: Secondary | ICD-10-CM | POA: Diagnosis not present

## 2012-01-29 DIAGNOSIS — L97929 Non-pressure chronic ulcer of unspecified part of left lower leg with unspecified severity: Secondary | ICD-10-CM | POA: Diagnosis not present

## 2012-01-29 DIAGNOSIS — E1059 Type 1 diabetes mellitus with other circulatory complications: Secondary | ICD-10-CM | POA: Diagnosis not present

## 2012-01-29 DIAGNOSIS — A419 Sepsis, unspecified organism: Secondary | ICD-10-CM | POA: Diagnosis not present

## 2012-01-29 DIAGNOSIS — N390 Urinary tract infection, site not specified: Secondary | ICD-10-CM | POA: Diagnosis not present

## 2012-01-29 DIAGNOSIS — I83219 Varicose veins of right lower extremity with both ulcer of unspecified site and inflammation: Secondary | ICD-10-CM | POA: Diagnosis not present

## 2012-01-29 DIAGNOSIS — T8789 Other complications of amputation stump: Secondary | ICD-10-CM | POA: Diagnosis not present

## 2012-01-29 DIAGNOSIS — T8189XA Other complications of procedures, not elsewhere classified, initial encounter: Secondary | ICD-10-CM | POA: Diagnosis not present

## 2012-01-29 DIAGNOSIS — Z5181 Encounter for therapeutic drug level monitoring: Secondary | ICD-10-CM | POA: Diagnosis not present

## 2012-01-30 DIAGNOSIS — T8789 Other complications of amputation stump: Secondary | ICD-10-CM | POA: Diagnosis not present

## 2012-01-30 DIAGNOSIS — N390 Urinary tract infection, site not specified: Secondary | ICD-10-CM | POA: Diagnosis not present

## 2012-01-30 DIAGNOSIS — E1059 Type 1 diabetes mellitus with other circulatory complications: Secondary | ICD-10-CM | POA: Diagnosis not present

## 2012-01-30 DIAGNOSIS — A419 Sepsis, unspecified organism: Secondary | ICD-10-CM | POA: Diagnosis not present

## 2012-01-30 DIAGNOSIS — T8189XA Other complications of procedures, not elsewhere classified, initial encounter: Secondary | ICD-10-CM | POA: Diagnosis not present

## 2012-01-31 DIAGNOSIS — N139 Obstructive and reflux uropathy, unspecified: Secondary | ICD-10-CM | POA: Diagnosis not present

## 2012-01-31 DIAGNOSIS — N401 Enlarged prostate with lower urinary tract symptoms: Secondary | ICD-10-CM | POA: Diagnosis not present

## 2012-01-31 DIAGNOSIS — N390 Urinary tract infection, site not specified: Secondary | ICD-10-CM | POA: Diagnosis not present

## 2012-01-31 DIAGNOSIS — R3915 Urgency of urination: Secondary | ICD-10-CM | POA: Diagnosis not present

## 2012-01-31 DIAGNOSIS — N200 Calculus of kidney: Secondary | ICD-10-CM | POA: Diagnosis not present

## 2012-01-31 DIAGNOSIS — R300 Dysuria: Secondary | ICD-10-CM | POA: Diagnosis not present

## 2012-02-02 DIAGNOSIS — I82409 Acute embolism and thrombosis of unspecified deep veins of unspecified lower extremity: Secondary | ICD-10-CM | POA: Diagnosis not present

## 2012-02-05 DIAGNOSIS — T8789 Other complications of amputation stump: Secondary | ICD-10-CM | POA: Diagnosis not present

## 2012-02-05 DIAGNOSIS — Z5181 Encounter for therapeutic drug level monitoring: Secondary | ICD-10-CM | POA: Diagnosis not present

## 2012-02-05 DIAGNOSIS — E1059 Type 1 diabetes mellitus with other circulatory complications: Secondary | ICD-10-CM | POA: Diagnosis not present

## 2012-02-05 DIAGNOSIS — A419 Sepsis, unspecified organism: Secondary | ICD-10-CM | POA: Diagnosis not present

## 2012-02-05 DIAGNOSIS — T8189XA Other complications of procedures, not elsewhere classified, initial encounter: Secondary | ICD-10-CM | POA: Diagnosis not present

## 2012-02-05 DIAGNOSIS — N390 Urinary tract infection, site not specified: Secondary | ICD-10-CM | POA: Diagnosis not present

## 2012-02-05 DIAGNOSIS — D649 Anemia, unspecified: Secondary | ICD-10-CM | POA: Diagnosis not present

## 2012-02-05 DIAGNOSIS — R599 Enlarged lymph nodes, unspecified: Secondary | ICD-10-CM | POA: Diagnosis not present

## 2012-02-06 DIAGNOSIS — T8189XA Other complications of procedures, not elsewhere classified, initial encounter: Secondary | ICD-10-CM | POA: Diagnosis not present

## 2012-02-06 DIAGNOSIS — N390 Urinary tract infection, site not specified: Secondary | ICD-10-CM | POA: Diagnosis not present

## 2012-02-06 DIAGNOSIS — E1059 Type 1 diabetes mellitus with other circulatory complications: Secondary | ICD-10-CM | POA: Diagnosis not present

## 2012-02-06 DIAGNOSIS — T8789 Other complications of amputation stump: Secondary | ICD-10-CM | POA: Diagnosis not present

## 2012-02-06 DIAGNOSIS — A419 Sepsis, unspecified organism: Secondary | ICD-10-CM | POA: Diagnosis not present

## 2012-02-07 DIAGNOSIS — R3915 Urgency of urination: Secondary | ICD-10-CM | POA: Diagnosis not present

## 2012-02-07 DIAGNOSIS — E119 Type 2 diabetes mellitus without complications: Secondary | ICD-10-CM | POA: Diagnosis not present

## 2012-02-07 DIAGNOSIS — R82998 Other abnormal findings in urine: Secondary | ICD-10-CM | POA: Diagnosis not present

## 2012-02-07 DIAGNOSIS — A4150 Gram-negative sepsis, unspecified: Secondary | ICD-10-CM | POA: Diagnosis not present

## 2012-02-07 DIAGNOSIS — R300 Dysuria: Secondary | ICD-10-CM | POA: Diagnosis not present

## 2012-02-12 DIAGNOSIS — A419 Sepsis, unspecified organism: Secondary | ICD-10-CM | POA: Diagnosis not present

## 2012-02-12 DIAGNOSIS — T8189XA Other complications of procedures, not elsewhere classified, initial encounter: Secondary | ICD-10-CM | POA: Diagnosis not present

## 2012-02-12 DIAGNOSIS — N390 Urinary tract infection, site not specified: Secondary | ICD-10-CM | POA: Diagnosis not present

## 2012-02-12 DIAGNOSIS — T8789 Other complications of amputation stump: Secondary | ICD-10-CM | POA: Diagnosis not present

## 2012-02-12 DIAGNOSIS — E1059 Type 1 diabetes mellitus with other circulatory complications: Secondary | ICD-10-CM | POA: Diagnosis not present

## 2012-02-16 DIAGNOSIS — R319 Hematuria, unspecified: Secondary | ICD-10-CM | POA: Diagnosis not present

## 2012-02-16 DIAGNOSIS — N401 Enlarged prostate with lower urinary tract symptoms: Secondary | ICD-10-CM | POA: Diagnosis not present

## 2012-02-16 DIAGNOSIS — R82998 Other abnormal findings in urine: Secondary | ICD-10-CM | POA: Diagnosis not present

## 2012-02-16 DIAGNOSIS — N390 Urinary tract infection, site not specified: Secondary | ICD-10-CM | POA: Diagnosis not present

## 2012-02-16 DIAGNOSIS — N139 Obstructive and reflux uropathy, unspecified: Secondary | ICD-10-CM | POA: Diagnosis not present

## 2012-02-16 DIAGNOSIS — R350 Frequency of micturition: Secondary | ICD-10-CM | POA: Diagnosis not present

## 2012-02-16 DIAGNOSIS — R300 Dysuria: Secondary | ICD-10-CM | POA: Diagnosis not present

## 2012-02-16 DIAGNOSIS — R351 Nocturia: Secondary | ICD-10-CM | POA: Diagnosis not present

## 2012-02-19 DIAGNOSIS — N390 Urinary tract infection, site not specified: Secondary | ICD-10-CM | POA: Diagnosis not present

## 2012-02-19 DIAGNOSIS — T8189XA Other complications of procedures, not elsewhere classified, initial encounter: Secondary | ICD-10-CM | POA: Diagnosis not present

## 2012-02-19 DIAGNOSIS — T8789 Other complications of amputation stump: Secondary | ICD-10-CM | POA: Diagnosis not present

## 2012-02-19 DIAGNOSIS — A419 Sepsis, unspecified organism: Secondary | ICD-10-CM | POA: Diagnosis not present

## 2012-02-19 DIAGNOSIS — E1059 Type 1 diabetes mellitus with other circulatory complications: Secondary | ICD-10-CM | POA: Diagnosis not present

## 2012-02-26 DIAGNOSIS — T8789 Other complications of amputation stump: Secondary | ICD-10-CM | POA: Diagnosis not present

## 2012-02-26 DIAGNOSIS — A419 Sepsis, unspecified organism: Secondary | ICD-10-CM | POA: Diagnosis not present

## 2012-02-26 DIAGNOSIS — E1059 Type 1 diabetes mellitus with other circulatory complications: Secondary | ICD-10-CM | POA: Diagnosis not present

## 2012-02-26 DIAGNOSIS — T8189XA Other complications of procedures, not elsewhere classified, initial encounter: Secondary | ICD-10-CM | POA: Diagnosis not present

## 2012-02-26 DIAGNOSIS — N390 Urinary tract infection, site not specified: Secondary | ICD-10-CM | POA: Diagnosis not present

## 2012-03-04 DIAGNOSIS — L97929 Non-pressure chronic ulcer of unspecified part of left lower leg with unspecified severity: Secondary | ICD-10-CM | POA: Diagnosis not present

## 2012-03-04 DIAGNOSIS — I83219 Varicose veins of right lower extremity with both ulcer of unspecified site and inflammation: Secondary | ICD-10-CM | POA: Diagnosis not present

## 2012-03-04 DIAGNOSIS — L97209 Non-pressure chronic ulcer of unspecified calf with unspecified severity: Secondary | ICD-10-CM | POA: Diagnosis not present

## 2012-03-08 DIAGNOSIS — R82998 Other abnormal findings in urine: Secondary | ICD-10-CM | POA: Diagnosis not present

## 2012-03-08 DIAGNOSIS — N139 Obstructive and reflux uropathy, unspecified: Secondary | ICD-10-CM | POA: Diagnosis not present

## 2012-03-08 DIAGNOSIS — R3915 Urgency of urination: Secondary | ICD-10-CM | POA: Diagnosis not present

## 2012-03-08 DIAGNOSIS — R350 Frequency of micturition: Secondary | ICD-10-CM | POA: Diagnosis not present

## 2012-03-08 DIAGNOSIS — N401 Enlarged prostate with lower urinary tract symptoms: Secondary | ICD-10-CM | POA: Diagnosis not present

## 2012-03-08 DIAGNOSIS — R351 Nocturia: Secondary | ICD-10-CM | POA: Diagnosis not present

## 2012-03-09 DIAGNOSIS — I82409 Acute embolism and thrombosis of unspecified deep veins of unspecified lower extremity: Secondary | ICD-10-CM | POA: Diagnosis not present

## 2012-03-12 DIAGNOSIS — E1059 Type 1 diabetes mellitus with other circulatory complications: Secondary | ICD-10-CM | POA: Diagnosis not present

## 2012-03-12 DIAGNOSIS — T8189XA Other complications of procedures, not elsewhere classified, initial encounter: Secondary | ICD-10-CM | POA: Diagnosis not present

## 2012-03-12 DIAGNOSIS — A419 Sepsis, unspecified organism: Secondary | ICD-10-CM | POA: Diagnosis not present

## 2012-03-12 DIAGNOSIS — T8789 Other complications of amputation stump: Secondary | ICD-10-CM | POA: Diagnosis not present

## 2012-03-12 DIAGNOSIS — N390 Urinary tract infection, site not specified: Secondary | ICD-10-CM | POA: Diagnosis not present

## 2012-03-21 DIAGNOSIS — N401 Enlarged prostate with lower urinary tract symptoms: Secondary | ICD-10-CM | POA: Diagnosis not present

## 2012-03-21 DIAGNOSIS — R3915 Urgency of urination: Secondary | ICD-10-CM | POA: Diagnosis not present

## 2012-03-21 DIAGNOSIS — R350 Frequency of micturition: Secondary | ICD-10-CM | POA: Diagnosis not present

## 2012-03-21 DIAGNOSIS — N139 Obstructive and reflux uropathy, unspecified: Secondary | ICD-10-CM | POA: Diagnosis not present

## 2012-03-21 DIAGNOSIS — R82998 Other abnormal findings in urine: Secondary | ICD-10-CM | POA: Diagnosis not present

## 2012-03-21 DIAGNOSIS — R300 Dysuria: Secondary | ICD-10-CM | POA: Diagnosis not present

## 2012-03-21 DIAGNOSIS — R351 Nocturia: Secondary | ICD-10-CM | POA: Diagnosis not present

## 2012-03-21 DIAGNOSIS — R319 Hematuria, unspecified: Secondary | ICD-10-CM | POA: Diagnosis not present

## 2012-03-25 DIAGNOSIS — R300 Dysuria: Secondary | ICD-10-CM | POA: Diagnosis not present

## 2012-03-25 DIAGNOSIS — N186 End stage renal disease: Secondary | ICD-10-CM | POA: Diagnosis not present

## 2012-03-25 DIAGNOSIS — Z0389 Encounter for observation for other suspected diseases and conditions ruled out: Secondary | ICD-10-CM | POA: Diagnosis not present

## 2012-03-25 DIAGNOSIS — R0602 Shortness of breath: Secondary | ICD-10-CM | POA: Diagnosis not present

## 2012-03-25 DIAGNOSIS — E876 Hypokalemia: Secondary | ICD-10-CM | POA: Diagnosis not present

## 2012-03-25 DIAGNOSIS — I959 Hypotension, unspecified: Secondary | ICD-10-CM | POA: Diagnosis not present

## 2012-03-25 DIAGNOSIS — R092 Respiratory arrest: Secondary | ICD-10-CM | POA: Diagnosis not present

## 2012-03-25 DIAGNOSIS — I498 Other specified cardiac arrhythmias: Secondary | ICD-10-CM | POA: Diagnosis not present

## 2012-03-25 DIAGNOSIS — M6281 Muscle weakness (generalized): Secondary | ICD-10-CM | POA: Diagnosis not present

## 2012-03-25 DIAGNOSIS — R82998 Other abnormal findings in urine: Secondary | ICD-10-CM | POA: Diagnosis not present

## 2012-03-25 DIAGNOSIS — I129 Hypertensive chronic kidney disease with stage 1 through stage 4 chronic kidney disease, or unspecified chronic kidney disease: Secondary | ICD-10-CM | POA: Diagnosis present

## 2012-03-25 DIAGNOSIS — N390 Urinary tract infection, site not specified: Secondary | ICD-10-CM | POA: Diagnosis not present

## 2012-03-25 DIAGNOSIS — D649 Anemia, unspecified: Secondary | ICD-10-CM | POA: Diagnosis not present

## 2012-03-25 DIAGNOSIS — K2980 Duodenitis without bleeding: Secondary | ICD-10-CM | POA: Diagnosis not present

## 2012-03-25 DIAGNOSIS — N200 Calculus of kidney: Secondary | ICD-10-CM | POA: Diagnosis not present

## 2012-03-25 DIAGNOSIS — R52 Pain, unspecified: Secondary | ICD-10-CM | POA: Diagnosis not present

## 2012-03-25 DIAGNOSIS — N401 Enlarged prostate with lower urinary tract symptoms: Secondary | ICD-10-CM | POA: Diagnosis present

## 2012-03-25 DIAGNOSIS — R918 Other nonspecific abnormal finding of lung field: Secondary | ICD-10-CM | POA: Diagnosis not present

## 2012-03-25 DIAGNOSIS — N2889 Other specified disorders of kidney and ureter: Secondary | ICD-10-CM | POA: Diagnosis not present

## 2012-03-25 DIAGNOSIS — K8020 Calculus of gallbladder without cholecystitis without obstruction: Secondary | ICD-10-CM | POA: Diagnosis not present

## 2012-03-25 DIAGNOSIS — E872 Acidosis, unspecified: Secondary | ICD-10-CM | POA: Diagnosis not present

## 2012-03-25 DIAGNOSIS — R609 Edema, unspecified: Secondary | ICD-10-CM | POA: Diagnosis not present

## 2012-03-25 DIAGNOSIS — E8779 Other fluid overload: Secondary | ICD-10-CM | POA: Diagnosis not present

## 2012-03-25 DIAGNOSIS — K5730 Diverticulosis of large intestine without perforation or abscess without bleeding: Secondary | ICD-10-CM | POA: Diagnosis not present

## 2012-03-25 DIAGNOSIS — I4891 Unspecified atrial fibrillation: Secondary | ICD-10-CM | POA: Diagnosis not present

## 2012-03-25 DIAGNOSIS — E1169 Type 2 diabetes mellitus with other specified complication: Secondary | ICD-10-CM | POA: Diagnosis not present

## 2012-03-25 DIAGNOSIS — E119 Type 2 diabetes mellitus without complications: Secondary | ICD-10-CM | POA: Diagnosis not present

## 2012-03-25 DIAGNOSIS — I10 Essential (primary) hypertension: Secondary | ICD-10-CM | POA: Diagnosis not present

## 2012-03-25 DIAGNOSIS — E871 Hypo-osmolality and hyponatremia: Secondary | ICD-10-CM | POA: Diagnosis not present

## 2012-03-25 DIAGNOSIS — M79609 Pain in unspecified limb: Secondary | ICD-10-CM | POA: Diagnosis not present

## 2012-03-25 DIAGNOSIS — Z5189 Encounter for other specified aftercare: Secondary | ICD-10-CM | POA: Diagnosis not present

## 2012-03-25 DIAGNOSIS — J90 Pleural effusion, not elsewhere classified: Secondary | ICD-10-CM | POA: Diagnosis not present

## 2012-03-25 DIAGNOSIS — J189 Pneumonia, unspecified organism: Secondary | ICD-10-CM | POA: Diagnosis not present

## 2012-03-25 DIAGNOSIS — R791 Abnormal coagulation profile: Secondary | ICD-10-CM | POA: Diagnosis not present

## 2012-03-25 DIAGNOSIS — R109 Unspecified abdominal pain: Secondary | ICD-10-CM | POA: Diagnosis not present

## 2012-03-25 DIAGNOSIS — J9600 Acute respiratory failure, unspecified whether with hypoxia or hypercapnia: Secondary | ICD-10-CM | POA: Diagnosis not present

## 2012-03-25 DIAGNOSIS — N1330 Unspecified hydronephrosis: Secondary | ICD-10-CM | POA: Diagnosis not present

## 2012-03-25 DIAGNOSIS — D689 Coagulation defect, unspecified: Secondary | ICD-10-CM | POA: Diagnosis not present

## 2012-03-25 DIAGNOSIS — N179 Acute kidney failure, unspecified: Secondary | ICD-10-CM | POA: Diagnosis not present

## 2012-03-25 DIAGNOSIS — R509 Fever, unspecified: Secondary | ICD-10-CM | POA: Diagnosis not present

## 2012-03-25 DIAGNOSIS — D509 Iron deficiency anemia, unspecified: Secondary | ICD-10-CM | POA: Diagnosis not present

## 2012-03-25 DIAGNOSIS — R1310 Dysphagia, unspecified: Secondary | ICD-10-CM | POA: Diagnosis not present

## 2012-03-25 DIAGNOSIS — N498 Inflammatory disorders of other specified male genital organs: Secondary | ICD-10-CM | POA: Diagnosis not present

## 2012-03-25 DIAGNOSIS — R05 Cough: Secondary | ICD-10-CM | POA: Diagnosis not present

## 2012-03-25 DIAGNOSIS — E785 Hyperlipidemia, unspecified: Secondary | ICD-10-CM | POA: Diagnosis present

## 2012-03-25 DIAGNOSIS — E875 Hyperkalemia: Secondary | ICD-10-CM | POA: Diagnosis not present

## 2012-03-25 DIAGNOSIS — R195 Other fecal abnormalities: Secondary | ICD-10-CM | POA: Diagnosis not present

## 2012-03-25 DIAGNOSIS — R1312 Dysphagia, oropharyngeal phase: Secondary | ICD-10-CM | POA: Diagnosis not present

## 2012-03-25 DIAGNOSIS — R279 Unspecified lack of coordination: Secondary | ICD-10-CM | POA: Diagnosis not present

## 2012-03-25 DIAGNOSIS — N139 Obstructive and reflux uropathy, unspecified: Secondary | ICD-10-CM | POA: Diagnosis present

## 2012-03-25 DIAGNOSIS — A419 Sepsis, unspecified organism: Secondary | ICD-10-CM | POA: Diagnosis not present

## 2012-03-25 DIAGNOSIS — A4159 Other Gram-negative sepsis: Secondary | ICD-10-CM | POA: Diagnosis not present

## 2012-03-25 DIAGNOSIS — N1370 Vesicoureteral-reflux, unspecified: Secondary | ICD-10-CM | POA: Diagnosis present

## 2012-03-25 DIAGNOSIS — I059 Rheumatic mitral valve disease, unspecified: Secondary | ICD-10-CM | POA: Diagnosis not present

## 2012-03-25 DIAGNOSIS — R079 Chest pain, unspecified: Secondary | ICD-10-CM | POA: Diagnosis not present

## 2012-04-17 DIAGNOSIS — J189 Pneumonia, unspecified organism: Secondary | ICD-10-CM | POA: Diagnosis not present

## 2012-04-17 DIAGNOSIS — E118 Type 2 diabetes mellitus with unspecified complications: Secondary | ICD-10-CM | POA: Diagnosis not present

## 2012-04-17 DIAGNOSIS — N200 Calculus of kidney: Secondary | ICD-10-CM | POA: Diagnosis not present

## 2012-04-17 DIAGNOSIS — I10 Essential (primary) hypertension: Secondary | ICD-10-CM | POA: Diagnosis not present

## 2012-04-17 DIAGNOSIS — N390 Urinary tract infection, site not specified: Secondary | ICD-10-CM | POA: Diagnosis not present

## 2012-04-17 DIAGNOSIS — R279 Unspecified lack of coordination: Secondary | ICD-10-CM | POA: Diagnosis not present

## 2012-04-17 DIAGNOSIS — E8779 Other fluid overload: Secondary | ICD-10-CM | POA: Diagnosis not present

## 2012-04-17 DIAGNOSIS — N289 Disorder of kidney and ureter, unspecified: Secondary | ICD-10-CM | POA: Diagnosis not present

## 2012-04-17 DIAGNOSIS — R1310 Dysphagia, unspecified: Secondary | ICD-10-CM | POA: Diagnosis not present

## 2012-04-17 DIAGNOSIS — M6281 Muscle weakness (generalized): Secondary | ICD-10-CM | POA: Diagnosis not present

## 2012-04-17 DIAGNOSIS — A4159 Other Gram-negative sepsis: Secondary | ICD-10-CM | POA: Diagnosis not present

## 2012-04-17 DIAGNOSIS — E119 Type 2 diabetes mellitus without complications: Secondary | ICD-10-CM | POA: Diagnosis not present

## 2012-04-17 DIAGNOSIS — R5381 Other malaise: Secondary | ICD-10-CM | POA: Diagnosis not present

## 2012-04-17 DIAGNOSIS — D649 Anemia, unspecified: Secondary | ICD-10-CM | POA: Diagnosis not present

## 2012-04-17 DIAGNOSIS — I4891 Unspecified atrial fibrillation: Secondary | ICD-10-CM | POA: Diagnosis not present

## 2012-04-17 DIAGNOSIS — R1312 Dysphagia, oropharyngeal phase: Secondary | ICD-10-CM | POA: Diagnosis not present

## 2012-04-17 DIAGNOSIS — Z5189 Encounter for other specified aftercare: Secondary | ICD-10-CM | POA: Diagnosis not present

## 2012-04-17 DIAGNOSIS — N1330 Unspecified hydronephrosis: Secondary | ICD-10-CM | POA: Diagnosis not present

## 2012-04-17 DIAGNOSIS — E038 Other specified hypothyroidism: Secondary | ICD-10-CM | POA: Diagnosis not present

## 2012-04-17 DIAGNOSIS — E785 Hyperlipidemia, unspecified: Secondary | ICD-10-CM | POA: Diagnosis not present

## 2012-04-17 DIAGNOSIS — N179 Acute kidney failure, unspecified: Secondary | ICD-10-CM | POA: Diagnosis not present

## 2012-04-17 DIAGNOSIS — M79609 Pain in unspecified limb: Secondary | ICD-10-CM | POA: Diagnosis not present

## 2012-04-19 DIAGNOSIS — I4891 Unspecified atrial fibrillation: Secondary | ICD-10-CM | POA: Diagnosis not present

## 2012-04-19 DIAGNOSIS — E119 Type 2 diabetes mellitus without complications: Secondary | ICD-10-CM | POA: Diagnosis not present

## 2012-04-19 DIAGNOSIS — E038 Other specified hypothyroidism: Secondary | ICD-10-CM | POA: Diagnosis not present

## 2012-04-19 DIAGNOSIS — E785 Hyperlipidemia, unspecified: Secondary | ICD-10-CM | POA: Diagnosis not present

## 2012-04-19 DIAGNOSIS — I10 Essential (primary) hypertension: Secondary | ICD-10-CM | POA: Diagnosis not present

## 2012-04-19 DIAGNOSIS — R5381 Other malaise: Secondary | ICD-10-CM | POA: Diagnosis not present

## 2012-04-19 DIAGNOSIS — N390 Urinary tract infection, site not specified: Secondary | ICD-10-CM | POA: Diagnosis not present

## 2012-04-23 DIAGNOSIS — D649 Anemia, unspecified: Secondary | ICD-10-CM | POA: Diagnosis not present

## 2012-04-23 DIAGNOSIS — I10 Essential (primary) hypertension: Secondary | ICD-10-CM | POA: Diagnosis not present

## 2012-04-24 DIAGNOSIS — M109 Gout, unspecified: Secondary | ICD-10-CM | POA: Diagnosis not present

## 2012-04-24 DIAGNOSIS — S88919A Complete traumatic amputation of unspecified lower leg, level unspecified, initial encounter: Secondary | ICD-10-CM | POA: Diagnosis not present

## 2012-04-24 DIAGNOSIS — N390 Urinary tract infection, site not specified: Secondary | ICD-10-CM | POA: Diagnosis not present

## 2012-04-24 DIAGNOSIS — N209 Urinary calculus, unspecified: Secondary | ICD-10-CM | POA: Diagnosis not present

## 2012-04-24 DIAGNOSIS — Z9981 Dependence on supplemental oxygen: Secondary | ICD-10-CM | POA: Diagnosis not present

## 2012-04-24 DIAGNOSIS — A419 Sepsis, unspecified organism: Secondary | ICD-10-CM | POA: Diagnosis not present

## 2012-04-24 DIAGNOSIS — N139 Obstructive and reflux uropathy, unspecified: Secondary | ICD-10-CM | POA: Diagnosis not present

## 2012-04-24 DIAGNOSIS — D649 Anemia, unspecified: Secondary | ICD-10-CM | POA: Diagnosis not present

## 2012-04-24 DIAGNOSIS — E119 Type 2 diabetes mellitus without complications: Secondary | ICD-10-CM | POA: Diagnosis not present

## 2012-04-24 DIAGNOSIS — N289 Disorder of kidney and ureter, unspecified: Secondary | ICD-10-CM | POA: Diagnosis not present

## 2012-04-24 DIAGNOSIS — E785 Hyperlipidemia, unspecified: Secondary | ICD-10-CM | POA: Diagnosis not present

## 2012-04-24 DIAGNOSIS — M6281 Muscle weakness (generalized): Secondary | ICD-10-CM | POA: Diagnosis not present

## 2012-04-24 DIAGNOSIS — E039 Hypothyroidism, unspecified: Secondary | ICD-10-CM | POA: Diagnosis not present

## 2012-04-24 DIAGNOSIS — N200 Calculus of kidney: Secondary | ICD-10-CM | POA: Diagnosis not present

## 2012-04-24 DIAGNOSIS — Z7901 Long term (current) use of anticoagulants: Secondary | ICD-10-CM | POA: Diagnosis not present

## 2012-04-24 DIAGNOSIS — I10 Essential (primary) hypertension: Secondary | ICD-10-CM | POA: Diagnosis not present

## 2012-04-24 DIAGNOSIS — I4891 Unspecified atrial fibrillation: Secondary | ICD-10-CM | POA: Diagnosis not present

## 2012-04-24 DIAGNOSIS — Z86718 Personal history of other venous thrombosis and embolism: Secondary | ICD-10-CM | POA: Diagnosis not present

## 2012-04-24 DIAGNOSIS — R0681 Apnea, not elsewhere classified: Secondary | ICD-10-CM | POA: Diagnosis not present

## 2012-04-24 DIAGNOSIS — J189 Pneumonia, unspecified organism: Secondary | ICD-10-CM | POA: Diagnosis not present

## 2012-05-02 DIAGNOSIS — I4891 Unspecified atrial fibrillation: Secondary | ICD-10-CM | POA: Diagnosis not present

## 2012-05-02 DIAGNOSIS — I10 Essential (primary) hypertension: Secondary | ICD-10-CM | POA: Diagnosis not present

## 2012-05-02 DIAGNOSIS — E039 Hypothyroidism, unspecified: Secondary | ICD-10-CM | POA: Diagnosis not present

## 2012-05-02 DIAGNOSIS — E119 Type 2 diabetes mellitus without complications: Secondary | ICD-10-CM | POA: Diagnosis not present

## 2012-06-07 DIAGNOSIS — E119 Type 2 diabetes mellitus without complications: Secondary | ICD-10-CM | POA: Diagnosis not present

## 2012-06-07 DIAGNOSIS — R5381 Other malaise: Secondary | ICD-10-CM | POA: Diagnosis not present

## 2012-06-07 DIAGNOSIS — S88119A Complete traumatic amputation at level between knee and ankle, unspecified lower leg, initial encounter: Secondary | ICD-10-CM | POA: Diagnosis not present

## 2012-06-07 DIAGNOSIS — I129 Hypertensive chronic kidney disease with stage 1 through stage 4 chronic kidney disease, or unspecified chronic kidney disease: Secondary | ICD-10-CM | POA: Diagnosis not present

## 2012-06-07 DIAGNOSIS — N189 Chronic kidney disease, unspecified: Secondary | ICD-10-CM | POA: Diagnosis not present

## 2012-06-10 DIAGNOSIS — R5381 Other malaise: Secondary | ICD-10-CM | POA: Diagnosis not present

## 2012-06-10 DIAGNOSIS — E119 Type 2 diabetes mellitus without complications: Secondary | ICD-10-CM | POA: Diagnosis not present

## 2012-06-10 DIAGNOSIS — S88119A Complete traumatic amputation at level between knee and ankle, unspecified lower leg, initial encounter: Secondary | ICD-10-CM | POA: Diagnosis not present

## 2012-06-10 DIAGNOSIS — I129 Hypertensive chronic kidney disease with stage 1 through stage 4 chronic kidney disease, or unspecified chronic kidney disease: Secondary | ICD-10-CM | POA: Diagnosis not present

## 2012-06-10 DIAGNOSIS — N189 Chronic kidney disease, unspecified: Secondary | ICD-10-CM | POA: Diagnosis not present

## 2012-06-12 DIAGNOSIS — S88119A Complete traumatic amputation at level between knee and ankle, unspecified lower leg, initial encounter: Secondary | ICD-10-CM | POA: Diagnosis not present

## 2012-06-12 DIAGNOSIS — I129 Hypertensive chronic kidney disease with stage 1 through stage 4 chronic kidney disease, or unspecified chronic kidney disease: Secondary | ICD-10-CM | POA: Diagnosis not present

## 2012-06-12 DIAGNOSIS — N189 Chronic kidney disease, unspecified: Secondary | ICD-10-CM | POA: Diagnosis not present

## 2012-06-12 DIAGNOSIS — E119 Type 2 diabetes mellitus without complications: Secondary | ICD-10-CM | POA: Diagnosis not present

## 2012-06-12 DIAGNOSIS — R5381 Other malaise: Secondary | ICD-10-CM | POA: Diagnosis not present

## 2012-06-14 DIAGNOSIS — I129 Hypertensive chronic kidney disease with stage 1 through stage 4 chronic kidney disease, or unspecified chronic kidney disease: Secondary | ICD-10-CM | POA: Diagnosis not present

## 2012-06-14 DIAGNOSIS — S88119A Complete traumatic amputation at level between knee and ankle, unspecified lower leg, initial encounter: Secondary | ICD-10-CM | POA: Diagnosis not present

## 2012-06-14 DIAGNOSIS — N189 Chronic kidney disease, unspecified: Secondary | ICD-10-CM | POA: Diagnosis not present

## 2012-06-14 DIAGNOSIS — E119 Type 2 diabetes mellitus without complications: Secondary | ICD-10-CM | POA: Diagnosis not present

## 2012-06-14 DIAGNOSIS — R5381 Other malaise: Secondary | ICD-10-CM | POA: Diagnosis not present

## 2012-06-18 DIAGNOSIS — S88119A Complete traumatic amputation at level between knee and ankle, unspecified lower leg, initial encounter: Secondary | ICD-10-CM | POA: Diagnosis not present

## 2012-06-18 DIAGNOSIS — N189 Chronic kidney disease, unspecified: Secondary | ICD-10-CM | POA: Diagnosis not present

## 2012-06-18 DIAGNOSIS — R5381 Other malaise: Secondary | ICD-10-CM | POA: Diagnosis not present

## 2012-06-18 DIAGNOSIS — E119 Type 2 diabetes mellitus without complications: Secondary | ICD-10-CM | POA: Diagnosis not present

## 2012-06-18 DIAGNOSIS — I129 Hypertensive chronic kidney disease with stage 1 through stage 4 chronic kidney disease, or unspecified chronic kidney disease: Secondary | ICD-10-CM | POA: Diagnosis not present

## 2012-06-19 DIAGNOSIS — E119 Type 2 diabetes mellitus without complications: Secondary | ICD-10-CM | POA: Diagnosis not present

## 2012-06-19 DIAGNOSIS — N189 Chronic kidney disease, unspecified: Secondary | ICD-10-CM | POA: Diagnosis not present

## 2012-06-19 DIAGNOSIS — R5381 Other malaise: Secondary | ICD-10-CM | POA: Diagnosis not present

## 2012-06-19 DIAGNOSIS — S88119A Complete traumatic amputation at level between knee and ankle, unspecified lower leg, initial encounter: Secondary | ICD-10-CM | POA: Diagnosis not present

## 2012-06-19 DIAGNOSIS — I129 Hypertensive chronic kidney disease with stage 1 through stage 4 chronic kidney disease, or unspecified chronic kidney disease: Secondary | ICD-10-CM | POA: Diagnosis not present

## 2012-06-21 DIAGNOSIS — E119 Type 2 diabetes mellitus without complications: Secondary | ICD-10-CM | POA: Diagnosis not present

## 2012-06-21 DIAGNOSIS — N189 Chronic kidney disease, unspecified: Secondary | ICD-10-CM | POA: Diagnosis not present

## 2012-06-21 DIAGNOSIS — S88119A Complete traumatic amputation at level between knee and ankle, unspecified lower leg, initial encounter: Secondary | ICD-10-CM | POA: Diagnosis not present

## 2012-06-21 DIAGNOSIS — R5381 Other malaise: Secondary | ICD-10-CM | POA: Diagnosis not present

## 2012-06-21 DIAGNOSIS — I129 Hypertensive chronic kidney disease with stage 1 through stage 4 chronic kidney disease, or unspecified chronic kidney disease: Secondary | ICD-10-CM | POA: Diagnosis not present

## 2012-06-22 DIAGNOSIS — E119 Type 2 diabetes mellitus without complications: Secondary | ICD-10-CM | POA: Diagnosis not present

## 2012-06-22 DIAGNOSIS — N390 Urinary tract infection, site not specified: Secondary | ICD-10-CM | POA: Diagnosis not present

## 2012-06-22 DIAGNOSIS — R5381 Other malaise: Secondary | ICD-10-CM | POA: Diagnosis not present

## 2012-06-22 DIAGNOSIS — S88119A Complete traumatic amputation at level between knee and ankle, unspecified lower leg, initial encounter: Secondary | ICD-10-CM | POA: Diagnosis not present

## 2012-06-22 DIAGNOSIS — A499 Bacterial infection, unspecified: Secondary | ICD-10-CM | POA: Diagnosis not present

## 2012-06-22 DIAGNOSIS — I129 Hypertensive chronic kidney disease with stage 1 through stage 4 chronic kidney disease, or unspecified chronic kidney disease: Secondary | ICD-10-CM | POA: Diagnosis not present

## 2012-06-22 DIAGNOSIS — N189 Chronic kidney disease, unspecified: Secondary | ICD-10-CM | POA: Diagnosis not present

## 2012-06-24 DIAGNOSIS — N189 Chronic kidney disease, unspecified: Secondary | ICD-10-CM | POA: Diagnosis not present

## 2012-06-24 DIAGNOSIS — R5381 Other malaise: Secondary | ICD-10-CM | POA: Diagnosis not present

## 2012-06-24 DIAGNOSIS — S88119A Complete traumatic amputation at level between knee and ankle, unspecified lower leg, initial encounter: Secondary | ICD-10-CM | POA: Diagnosis not present

## 2012-06-24 DIAGNOSIS — E119 Type 2 diabetes mellitus without complications: Secondary | ICD-10-CM | POA: Diagnosis not present

## 2012-06-24 DIAGNOSIS — I129 Hypertensive chronic kidney disease with stage 1 through stage 4 chronic kidney disease, or unspecified chronic kidney disease: Secondary | ICD-10-CM | POA: Diagnosis not present

## 2012-06-25 DIAGNOSIS — R310 Gross hematuria: Secondary | ICD-10-CM | POA: Diagnosis not present

## 2012-06-25 DIAGNOSIS — N200 Calculus of kidney: Secondary | ICD-10-CM | POA: Diagnosis not present

## 2012-06-25 DIAGNOSIS — R300 Dysuria: Secondary | ICD-10-CM | POA: Diagnosis not present

## 2012-06-26 DIAGNOSIS — R5381 Other malaise: Secondary | ICD-10-CM | POA: Diagnosis not present

## 2012-06-26 DIAGNOSIS — N189 Chronic kidney disease, unspecified: Secondary | ICD-10-CM | POA: Diagnosis not present

## 2012-06-26 DIAGNOSIS — S88119A Complete traumatic amputation at level between knee and ankle, unspecified lower leg, initial encounter: Secondary | ICD-10-CM | POA: Diagnosis not present

## 2012-06-26 DIAGNOSIS — I129 Hypertensive chronic kidney disease with stage 1 through stage 4 chronic kidney disease, or unspecified chronic kidney disease: Secondary | ICD-10-CM | POA: Diagnosis not present

## 2012-06-26 DIAGNOSIS — E119 Type 2 diabetes mellitus without complications: Secondary | ICD-10-CM | POA: Diagnosis not present

## 2012-06-27 DIAGNOSIS — R5381 Other malaise: Secondary | ICD-10-CM | POA: Diagnosis not present

## 2012-06-27 DIAGNOSIS — I129 Hypertensive chronic kidney disease with stage 1 through stage 4 chronic kidney disease, or unspecified chronic kidney disease: Secondary | ICD-10-CM | POA: Diagnosis not present

## 2012-06-27 DIAGNOSIS — N189 Chronic kidney disease, unspecified: Secondary | ICD-10-CM | POA: Diagnosis not present

## 2012-06-27 DIAGNOSIS — E119 Type 2 diabetes mellitus without complications: Secondary | ICD-10-CM | POA: Diagnosis not present

## 2012-06-27 DIAGNOSIS — S88119A Complete traumatic amputation at level between knee and ankle, unspecified lower leg, initial encounter: Secondary | ICD-10-CM | POA: Diagnosis not present

## 2012-06-28 DIAGNOSIS — N189 Chronic kidney disease, unspecified: Secondary | ICD-10-CM | POA: Diagnosis not present

## 2012-06-28 DIAGNOSIS — R5381 Other malaise: Secondary | ICD-10-CM | POA: Diagnosis not present

## 2012-06-28 DIAGNOSIS — S88119A Complete traumatic amputation at level between knee and ankle, unspecified lower leg, initial encounter: Secondary | ICD-10-CM | POA: Diagnosis not present

## 2012-06-28 DIAGNOSIS — I129 Hypertensive chronic kidney disease with stage 1 through stage 4 chronic kidney disease, or unspecified chronic kidney disease: Secondary | ICD-10-CM | POA: Diagnosis not present

## 2012-06-28 DIAGNOSIS — E119 Type 2 diabetes mellitus without complications: Secondary | ICD-10-CM | POA: Diagnosis not present

## 2012-07-01 DIAGNOSIS — N281 Cyst of kidney, acquired: Secondary | ICD-10-CM | POA: Diagnosis not present

## 2012-07-01 DIAGNOSIS — K8020 Calculus of gallbladder without cholecystitis without obstruction: Secondary | ICD-10-CM | POA: Diagnosis not present

## 2012-07-01 DIAGNOSIS — N200 Calculus of kidney: Secondary | ICD-10-CM | POA: Diagnosis not present

## 2012-07-02 DIAGNOSIS — E119 Type 2 diabetes mellitus without complications: Secondary | ICD-10-CM | POA: Diagnosis not present

## 2012-07-02 DIAGNOSIS — S88119A Complete traumatic amputation at level between knee and ankle, unspecified lower leg, initial encounter: Secondary | ICD-10-CM | POA: Diagnosis not present

## 2012-07-02 DIAGNOSIS — N189 Chronic kidney disease, unspecified: Secondary | ICD-10-CM | POA: Diagnosis not present

## 2012-07-02 DIAGNOSIS — R5381 Other malaise: Secondary | ICD-10-CM | POA: Diagnosis not present

## 2012-07-02 DIAGNOSIS — I129 Hypertensive chronic kidney disease with stage 1 through stage 4 chronic kidney disease, or unspecified chronic kidney disease: Secondary | ICD-10-CM | POA: Diagnosis not present

## 2012-07-04 DIAGNOSIS — E119 Type 2 diabetes mellitus without complications: Secondary | ICD-10-CM | POA: Diagnosis not present

## 2012-07-04 DIAGNOSIS — R5381 Other malaise: Secondary | ICD-10-CM | POA: Diagnosis not present

## 2012-07-04 DIAGNOSIS — N189 Chronic kidney disease, unspecified: Secondary | ICD-10-CM | POA: Diagnosis not present

## 2012-07-04 DIAGNOSIS — I129 Hypertensive chronic kidney disease with stage 1 through stage 4 chronic kidney disease, or unspecified chronic kidney disease: Secondary | ICD-10-CM | POA: Diagnosis not present

## 2012-07-04 DIAGNOSIS — S88119A Complete traumatic amputation at level between knee and ankle, unspecified lower leg, initial encounter: Secondary | ICD-10-CM | POA: Diagnosis not present

## 2012-07-05 DIAGNOSIS — R5381 Other malaise: Secondary | ICD-10-CM | POA: Diagnosis not present

## 2012-07-05 DIAGNOSIS — S88119A Complete traumatic amputation at level between knee and ankle, unspecified lower leg, initial encounter: Secondary | ICD-10-CM | POA: Diagnosis not present

## 2012-07-05 DIAGNOSIS — E119 Type 2 diabetes mellitus without complications: Secondary | ICD-10-CM | POA: Diagnosis not present

## 2012-07-05 DIAGNOSIS — I129 Hypertensive chronic kidney disease with stage 1 through stage 4 chronic kidney disease, or unspecified chronic kidney disease: Secondary | ICD-10-CM | POA: Diagnosis not present

## 2012-07-05 DIAGNOSIS — N189 Chronic kidney disease, unspecified: Secondary | ICD-10-CM | POA: Diagnosis not present

## 2012-07-09 DIAGNOSIS — N189 Chronic kidney disease, unspecified: Secondary | ICD-10-CM | POA: Diagnosis not present

## 2012-07-09 DIAGNOSIS — I129 Hypertensive chronic kidney disease with stage 1 through stage 4 chronic kidney disease, or unspecified chronic kidney disease: Secondary | ICD-10-CM | POA: Diagnosis not present

## 2012-07-09 DIAGNOSIS — E119 Type 2 diabetes mellitus without complications: Secondary | ICD-10-CM | POA: Diagnosis not present

## 2012-07-09 DIAGNOSIS — R5381 Other malaise: Secondary | ICD-10-CM | POA: Diagnosis not present

## 2012-07-09 DIAGNOSIS — S88119A Complete traumatic amputation at level between knee and ankle, unspecified lower leg, initial encounter: Secondary | ICD-10-CM | POA: Diagnosis not present

## 2012-07-10 DIAGNOSIS — S88119A Complete traumatic amputation at level between knee and ankle, unspecified lower leg, initial encounter: Secondary | ICD-10-CM | POA: Diagnosis not present

## 2012-07-10 DIAGNOSIS — N189 Chronic kidney disease, unspecified: Secondary | ICD-10-CM | POA: Diagnosis not present

## 2012-07-10 DIAGNOSIS — E119 Type 2 diabetes mellitus without complications: Secondary | ICD-10-CM | POA: Diagnosis not present

## 2012-07-10 DIAGNOSIS — R5381 Other malaise: Secondary | ICD-10-CM | POA: Diagnosis not present

## 2012-07-10 DIAGNOSIS — I129 Hypertensive chronic kidney disease with stage 1 through stage 4 chronic kidney disease, or unspecified chronic kidney disease: Secondary | ICD-10-CM | POA: Diagnosis not present

## 2012-07-11 DIAGNOSIS — N189 Chronic kidney disease, unspecified: Secondary | ICD-10-CM | POA: Diagnosis not present

## 2012-07-11 DIAGNOSIS — R5381 Other malaise: Secondary | ICD-10-CM | POA: Diagnosis not present

## 2012-07-11 DIAGNOSIS — E119 Type 2 diabetes mellitus without complications: Secondary | ICD-10-CM | POA: Diagnosis not present

## 2012-07-11 DIAGNOSIS — I129 Hypertensive chronic kidney disease with stage 1 through stage 4 chronic kidney disease, or unspecified chronic kidney disease: Secondary | ICD-10-CM | POA: Diagnosis not present

## 2012-07-11 DIAGNOSIS — S88119A Complete traumatic amputation at level between knee and ankle, unspecified lower leg, initial encounter: Secondary | ICD-10-CM | POA: Diagnosis not present

## 2012-07-15 DIAGNOSIS — N200 Calculus of kidney: Secondary | ICD-10-CM | POA: Diagnosis not present

## 2012-07-15 DIAGNOSIS — R310 Gross hematuria: Secondary | ICD-10-CM | POA: Diagnosis not present

## 2012-07-15 DIAGNOSIS — R300 Dysuria: Secondary | ICD-10-CM | POA: Diagnosis not present

## 2012-07-16 DIAGNOSIS — N189 Chronic kidney disease, unspecified: Secondary | ICD-10-CM | POA: Diagnosis not present

## 2012-07-16 DIAGNOSIS — E119 Type 2 diabetes mellitus without complications: Secondary | ICD-10-CM | POA: Diagnosis not present

## 2012-07-16 DIAGNOSIS — I129 Hypertensive chronic kidney disease with stage 1 through stage 4 chronic kidney disease, or unspecified chronic kidney disease: Secondary | ICD-10-CM | POA: Diagnosis not present

## 2012-07-16 DIAGNOSIS — S88119A Complete traumatic amputation at level between knee and ankle, unspecified lower leg, initial encounter: Secondary | ICD-10-CM | POA: Diagnosis not present

## 2012-07-16 DIAGNOSIS — R5381 Other malaise: Secondary | ICD-10-CM | POA: Diagnosis not present

## 2012-07-18 DIAGNOSIS — R5381 Other malaise: Secondary | ICD-10-CM | POA: Diagnosis not present

## 2012-07-18 DIAGNOSIS — E119 Type 2 diabetes mellitus without complications: Secondary | ICD-10-CM | POA: Diagnosis not present

## 2012-07-18 DIAGNOSIS — S88119A Complete traumatic amputation at level between knee and ankle, unspecified lower leg, initial encounter: Secondary | ICD-10-CM | POA: Diagnosis not present

## 2012-07-18 DIAGNOSIS — N189 Chronic kidney disease, unspecified: Secondary | ICD-10-CM | POA: Diagnosis not present

## 2012-07-18 DIAGNOSIS — I129 Hypertensive chronic kidney disease with stage 1 through stage 4 chronic kidney disease, or unspecified chronic kidney disease: Secondary | ICD-10-CM | POA: Diagnosis not present

## 2012-07-26 DIAGNOSIS — S88119A Complete traumatic amputation at level between knee and ankle, unspecified lower leg, initial encounter: Secondary | ICD-10-CM | POA: Diagnosis not present

## 2012-07-26 DIAGNOSIS — I129 Hypertensive chronic kidney disease with stage 1 through stage 4 chronic kidney disease, or unspecified chronic kidney disease: Secondary | ICD-10-CM | POA: Diagnosis not present

## 2012-07-26 DIAGNOSIS — E119 Type 2 diabetes mellitus without complications: Secondary | ICD-10-CM | POA: Diagnosis not present

## 2012-07-26 DIAGNOSIS — R5381 Other malaise: Secondary | ICD-10-CM | POA: Diagnosis not present

## 2012-07-26 DIAGNOSIS — N189 Chronic kidney disease, unspecified: Secondary | ICD-10-CM | POA: Diagnosis not present

## 2012-08-01 DIAGNOSIS — N189 Chronic kidney disease, unspecified: Secondary | ICD-10-CM | POA: Diagnosis not present

## 2012-08-01 DIAGNOSIS — S88119A Complete traumatic amputation at level between knee and ankle, unspecified lower leg, initial encounter: Secondary | ICD-10-CM | POA: Diagnosis not present

## 2012-08-01 DIAGNOSIS — E119 Type 2 diabetes mellitus without complications: Secondary | ICD-10-CM | POA: Diagnosis not present

## 2012-08-01 DIAGNOSIS — R5381 Other malaise: Secondary | ICD-10-CM | POA: Diagnosis not present

## 2012-08-01 DIAGNOSIS — I129 Hypertensive chronic kidney disease with stage 1 through stage 4 chronic kidney disease, or unspecified chronic kidney disease: Secondary | ICD-10-CM | POA: Diagnosis not present

## 2012-08-04 ENCOUNTER — Inpatient Hospital Stay (HOSPITAL_COMMUNITY)
Admission: EM | Admit: 2012-08-04 | Discharge: 2012-08-07 | DRG: 683 | Disposition: A | Discharge status: Home / Self Care | Payer: Medicare Other | Attending: Family Medicine | Admitting: Family Medicine

## 2012-08-04 ENCOUNTER — Encounter (HOSPITAL_COMMUNITY): Payer: Self-pay | Admitting: Emergency Medicine

## 2012-08-04 ENCOUNTER — Emergency Department (HOSPITAL_COMMUNITY): Payer: Medicare Other

## 2012-08-04 DIAGNOSIS — Z6841 Body Mass Index (BMI) 40.0 and over, adult: Secondary | ICD-10-CM | POA: Diagnosis not present

## 2012-08-04 DIAGNOSIS — I739 Peripheral vascular disease, unspecified: Secondary | ICD-10-CM | POA: Diagnosis present

## 2012-08-04 DIAGNOSIS — E872 Acidosis, unspecified: Secondary | ICD-10-CM | POA: Diagnosis not present

## 2012-08-04 DIAGNOSIS — E119 Type 2 diabetes mellitus without complications: Secondary | ICD-10-CM

## 2012-08-04 DIAGNOSIS — N200 Calculus of kidney: Secondary | ICD-10-CM | POA: Diagnosis not present

## 2012-08-04 DIAGNOSIS — I82409 Acute embolism and thrombosis of unspecified deep veins of unspecified lower extremity: Secondary | ICD-10-CM | POA: Diagnosis not present

## 2012-08-04 DIAGNOSIS — E1169 Type 2 diabetes mellitus with other specified complication: Secondary | ICD-10-CM | POA: Diagnosis present

## 2012-08-04 DIAGNOSIS — I472 Ventricular tachycardia, unspecified: Secondary | ICD-10-CM | POA: Diagnosis present

## 2012-08-04 DIAGNOSIS — I82509 Chronic embolism and thrombosis of unspecified deep veins of unspecified lower extremity: Secondary | ICD-10-CM | POA: Diagnosis present

## 2012-08-04 DIAGNOSIS — I10 Essential (primary) hypertension: Secondary | ICD-10-CM

## 2012-08-04 DIAGNOSIS — B961 Klebsiella pneumoniae [K. pneumoniae] as the cause of diseases classified elsewhere: Secondary | ICD-10-CM | POA: Diagnosis present

## 2012-08-04 DIAGNOSIS — R3989 Other symptoms and signs involving the genitourinary system: Secondary | ICD-10-CM | POA: Diagnosis not present

## 2012-08-04 DIAGNOSIS — N390 Urinary tract infection, site not specified: Secondary | ICD-10-CM | POA: Diagnosis not present

## 2012-08-04 DIAGNOSIS — E11649 Type 2 diabetes mellitus with hypoglycemia without coma: Secondary | ICD-10-CM

## 2012-08-04 DIAGNOSIS — E1121 Type 2 diabetes mellitus with diabetic nephropathy: Secondary | ICD-10-CM | POA: Diagnosis present

## 2012-08-04 DIAGNOSIS — D62 Acute posthemorrhagic anemia: Secondary | ICD-10-CM

## 2012-08-04 DIAGNOSIS — K921 Melena: Secondary | ICD-10-CM

## 2012-08-04 DIAGNOSIS — E785 Hyperlipidemia, unspecified: Secondary | ICD-10-CM | POA: Diagnosis not present

## 2012-08-04 DIAGNOSIS — Z7901 Long term (current) use of anticoagulants: Secondary | ICD-10-CM | POA: Diagnosis not present

## 2012-08-04 DIAGNOSIS — N179 Acute kidney failure, unspecified: Principal | ICD-10-CM

## 2012-08-04 DIAGNOSIS — D649 Anemia, unspecified: Secondary | ICD-10-CM

## 2012-08-04 DIAGNOSIS — E875 Hyperkalemia: Secondary | ICD-10-CM | POA: Diagnosis not present

## 2012-08-04 DIAGNOSIS — E669 Obesity, unspecified: Secondary | ICD-10-CM

## 2012-08-04 DIAGNOSIS — K8020 Calculus of gallbladder without cholecystitis without obstruction: Secondary | ICD-10-CM | POA: Diagnosis not present

## 2012-08-04 DIAGNOSIS — E039 Hypothyroidism, unspecified: Secondary | ICD-10-CM

## 2012-08-04 DIAGNOSIS — Z794 Long term (current) use of insulin: Secondary | ICD-10-CM | POA: Diagnosis present

## 2012-08-04 DIAGNOSIS — R319 Hematuria, unspecified: Secondary | ICD-10-CM | POA: Diagnosis present

## 2012-08-04 DIAGNOSIS — E162 Hypoglycemia, unspecified: Secondary | ICD-10-CM

## 2012-08-04 DIAGNOSIS — K5730 Diverticulosis of large intestine without perforation or abscess without bleeding: Secondary | ICD-10-CM

## 2012-08-04 DIAGNOSIS — S88119A Complete traumatic amputation at level between knee and ankle, unspecified lower leg, initial encounter: Secondary | ICD-10-CM

## 2012-08-04 DIAGNOSIS — I4729 Other ventricular tachycardia: Secondary | ICD-10-CM | POA: Diagnosis present

## 2012-08-04 LAB — CBC WITH DIFFERENTIAL/PLATELET
Eosinophils Relative: 3 % (ref 0–5)
HCT: 33.7 % — ABNORMAL LOW (ref 39.0–52.0)
Lymphocytes Relative: 11 % — ABNORMAL LOW (ref 12–46)
Lymphs Abs: 0.7 10*3/uL (ref 0.7–4.0)
MCV: 94.9 fL (ref 78.0–100.0)
Monocytes Absolute: 0.5 10*3/uL (ref 0.1–1.0)
Monocytes Relative: 8 % (ref 3–12)
RDW: 18 % — ABNORMAL HIGH (ref 11.5–15.5)
WBC: 6.6 10*3/uL (ref 4.0–10.5)

## 2012-08-04 LAB — URINALYSIS, ROUTINE W REFLEX MICROSCOPIC
Bilirubin Urine: NEGATIVE
Glucose, UA: NEGATIVE mg/dL
Specific Gravity, Urine: 1.015 (ref 1.005–1.030)
pH: 5.5 (ref 5.0–8.0)

## 2012-08-04 LAB — BASIC METABOLIC PANEL
CO2: 14 mEq/L — ABNORMAL LOW (ref 19–32)
CO2: 15 mEq/L — ABNORMAL LOW (ref 19–32)
Calcium: 8.9 mg/dL (ref 8.4–10.5)
Chloride: 108 mEq/L (ref 96–112)
Creatinine, Ser: 2.35 mg/dL — ABNORMAL HIGH (ref 0.50–1.35)
Creatinine, Ser: 2.36 mg/dL — ABNORMAL HIGH (ref 0.50–1.35)
Glucose, Bld: 159 mg/dL — ABNORMAL HIGH (ref 70–99)

## 2012-08-04 LAB — GLUCOSE, CAPILLARY
Glucose-Capillary: 134 mg/dL — ABNORMAL HIGH (ref 70–99)
Glucose-Capillary: 143 mg/dL — ABNORMAL HIGH (ref 70–99)

## 2012-08-04 LAB — PROTIME-INR: INR: 1.55 — ABNORMAL HIGH (ref 0.00–1.49)

## 2012-08-04 LAB — POCT I-STAT, CHEM 8
Chloride: 119 mEq/L — ABNORMAL HIGH (ref 96–112)
Creatinine, Ser: 2.6 mg/dL — ABNORMAL HIGH (ref 0.50–1.35)
Glucose, Bld: 43 mg/dL — CL (ref 70–99)
HCT: 34 % — ABNORMAL LOW (ref 39.0–52.0)
Potassium: 5.5 mEq/L — ABNORMAL HIGH (ref 3.5–5.1)

## 2012-08-04 LAB — URINE MICROSCOPIC-ADD ON

## 2012-08-04 MED ORDER — ALBUTEROL SULFATE (5 MG/ML) 0.5% IN NEBU: 2.5000 mg | INHALATION_SOLUTION | RESPIRATORY_TRACT | Status: DC | PRN

## 2012-08-04 MED ORDER — ACETAMINOPHEN 650 MG RE SUPP: 650.0000 mg | Freq: Four times a day (QID) | RECTAL | Status: DC | PRN

## 2012-08-04 MED ORDER — SODIUM CHLORIDE 0.9 % IJ SOLN
3.0000 mL | Freq: Two times a day (BID) | INTRAMUSCULAR | Status: DC
  Administered 2012-08-04 – 2012-08-06 (×2): 3 mL via INTRAVENOUS

## 2012-08-04 MED ORDER — DEXTROSE-NACL 5-0.9 % IV SOLN
INTRAVENOUS | Status: DC
  Administered 2012-08-04 – 2012-08-05 (×2): via INTRAVENOUS

## 2012-08-04 MED ORDER — ONDANSETRON HCL 4 MG/2ML IJ SOLN
4.0000 mg | Freq: Four times a day (QID) | INTRAMUSCULAR | Status: DC | PRN
  Administered 2012-08-04: 4 mg via INTRAVENOUS
  Filled 2012-08-04: qty 2

## 2012-08-04 MED ORDER — HYDROCODONE-ACETAMINOPHEN 5-325 MG PO TABS
1.0000 | ORAL_TABLET | ORAL | Status: DC | PRN
  Administered 2012-08-05 – 2012-08-06 (×2): 2 via ORAL
  Administered 2012-08-06: 1 via ORAL
  Administered 2012-08-06 – 2012-08-07 (×6): 2 via ORAL
  Filled 2012-08-04 (×9): qty 2

## 2012-08-04 MED ORDER — HYDROMORPHONE HCL PF 1 MG/ML IJ SOLN
0.5000 mg | INTRAMUSCULAR | Status: DC | PRN
  Administered 2012-08-04 – 2012-08-07 (×14): 0.5 mg via INTRAVENOUS
  Filled 2012-08-04 (×14): qty 1

## 2012-08-04 MED ORDER — ONDANSETRON HCL 4 MG PO TABS: 4.0000 mg | ORAL_TABLET | Freq: Four times a day (QID) | ORAL | Status: DC | PRN

## 2012-08-04 MED ORDER — ONDANSETRON HCL 4 MG/2ML IJ SOLN
4.0000 mg | Freq: Once | INTRAMUSCULAR | Status: AC
  Administered 2012-08-04: 4 mg via INTRAVENOUS
  Filled 2012-08-04: qty 2

## 2012-08-04 MED ORDER — SODIUM CHLORIDE 0.9 % IV BOLUS (SEPSIS)
500.0000 mL | Freq: Once | INTRAVENOUS | Status: AC
  Administered 2012-08-04: 500 mL via INTRAVENOUS

## 2012-08-04 MED ORDER — WARFARIN SODIUM 5 MG PO TABS
5.0000 mg | ORAL_TABLET | Freq: Once | ORAL | Status: AC
  Administered 2012-08-04: 5 mg via ORAL
  Filled 2012-08-04: qty 1

## 2012-08-04 MED ORDER — WARFARIN - PHARMACIST DOSING INPATIENT: Freq: Every day | Status: DC

## 2012-08-04 MED ORDER — ROSUVASTATIN CALCIUM 20 MG PO TABS
20.0000 mg | ORAL_TABLET | Freq: Every day | ORAL | Status: DC
  Administered 2012-08-04 – 2012-08-06 (×3): 20 mg via ORAL
  Filled 2012-08-04 (×4): qty 1

## 2012-08-04 MED ORDER — FENTANYL CITRATE 0.05 MG/ML IJ SOLN
100.0000 ug | Freq: Once | INTRAMUSCULAR | Status: AC
  Administered 2012-08-04: 100 ug via INTRAVENOUS
  Filled 2012-08-04: qty 2

## 2012-08-04 MED ORDER — ACETAMINOPHEN 325 MG PO TABS: 650.0000 mg | ORAL_TABLET | Freq: Four times a day (QID) | ORAL | Status: DC | PRN

## 2012-08-04 MED ORDER — DEXTROSE 5 % IV SOLN
1.0000 g | INTRAVENOUS | Status: DC
  Administered 2012-08-04 – 2012-08-06 (×3): 1 g via INTRAVENOUS
  Filled 2012-08-04 (×4): qty 10

## 2012-08-04 MED ORDER — LEVOTHYROXINE SODIUM 200 MCG PO TABS
200.0000 ug | ORAL_TABLET | Freq: Every day | ORAL | Status: DC
  Administered 2012-08-05 – 2012-08-07 (×3): 200 ug via ORAL
  Filled 2012-08-04 (×4): qty 1

## 2012-08-04 NOTE — ED Provider Notes (Signed)
History     CSN: [id number]  Arrival date & time 08/04/12  1525   First MD Initiated Contact with Patient 08/04/12 1539      Chief Complaint  Patient presents with  . Nephrolithiasis     HPI Pt c/o lower groin pain since last week. Pt states he passed a "chip" of a kidney stone last week. Pt states he has a hx of passing kidney stones. Pt also reports that he has to urinate about every 10 min, but is not able to fully empty his bladder  Past Medical History  Diagnosis Date  . Sleep apnea     uses cpap  . Hypothyroidism   . Anemia   . Blood transfusion   . Chronic kidney disease     hx of kidney stones  . Hypertension   . Arthritis     osteoarthritis  . Pneumonia   . Diabetes mellitus     insulin dependent    Past Surgical History  Procedure Laterality Date  . Bilteral hip      replacement & revison  's   . Knee surgery      left knee    . Tonsillectomy    . Amputation  02/07/2011    Procedure: AMPUTATION BELOW KNEE;  Surgeon: Newt Minion, MD;  Location: [place];  Service: Orthopedics;  Laterality: Left;  Left Below Knee Amputation    Family History  Problem Relation Age of Onset  . Diabetes Mother   . Colon cancer Father     History  Substance Use Topics  . Smoking status: Never Smoker   . Smokeless tobacco: Never Used  . Alcohol Use: No      Review of Systems All other systems reviewed and are negative Allergies  Review of patient's allergies indicates no known allergies.  Home Medications   No current outpatient prescriptions on file.  BP 107/72  Pulse 102  Temp(Src) 98.3 F (36.8 C) (Oral)  Resp 18  Ht 5\' 11"  (1.803 m)  Wt 291 lb 0.1 oz (132 kg)  BMI 40.61 kg/m2  SpO2 97%  Physical Exam  Nursing note and vitals reviewed. Constitutional: He is oriented to person, place, and time. He appears well-developed and well-nourished. No distress.  HENT:  Head: Normocephalic and atraumatic.  Eyes: Pupils are equal, round, and reactive to  light.  Neck: Normal range of motion.  Cardiovascular: Normal rate and intact distal pulses.   Pulmonary/Chest: No respiratory distress. He has no wheezes.  Abdominal: Normal appearance. He exhibits no distension. There is no rebound and no guarding.  Genitourinary: Penis normal.  Neurological: He is alert and oriented to person, place, and time. No cranial nerve deficit.  Skin: Skin is warm and dry. No rash noted.  Psychiatric: He has a normal mood and affect. His behavior is normal.    ED Course  Procedures (including critical care time)  Labs Reviewed  MRSA PCR SCREENING - Abnormal; Notable for the following:    MRSA by PCR POSITIVE (*)    All other components within normal limits  URINALYSIS, ROUTINE W REFLEX MICROSCOPIC - Abnormal; Notable for the following:    APPearance TURBID (*)    Hgb urine dipstick LARGE (*)    Protein, ur 100 (*)    Leukocytes, UA LARGE (*)    All other components within normal limits  BASIC METABOLIC PANEL - Abnormal; Notable for the following:    Potassium 5.4 (*)    CO2 14 (*)  Glucose, Bld 43 (*)    BUN 61 (*)    Creatinine, Ser 2.35 (*)    GFR calc non Af Amer 30 (*)    GFR calc Af Amer 34 (*)    All other components within normal limits  CBC WITH DIFFERENTIAL - Abnormal; Notable for the following:    RBC 3.55 (*)    Hemoglobin 10.6 (*)    HCT 33.7 (*)    RDW 18.0 (*)    Neutrophils Relative % 78 (*)    Lymphocytes Relative 11 (*)    All other components within normal limits  PROTIME-INR - Abnormal; Notable for the following:    Prothrombin Time 18.1 (*)    INR 1.55 (*)    All other components within normal limits  GLUCOSE, CAPILLARY - Abnormal; Notable for the following:    Glucose-Capillary 134 (*)    All other components within normal limits  BASIC METABOLIC PANEL - Abnormal; Notable for the following:    Potassium 5.7 (*)    CO2 15 (*)    Glucose, Bld 159 (*)    BUN 59 (*)    Creatinine, Ser 2.36 (*)    GFR calc non Af Amer  30 (*)    GFR calc Af Amer 34 (*)    All other components within normal limits  COMPREHENSIVE METABOLIC PANEL - Abnormal; Notable for the following:    Potassium 5.8 (*)    CO2 15 (*)    Glucose, Bld 159 (*)    BUN 61 (*)    Creatinine, Ser 2.33 (*)    Albumin 3.0 (*)    Total Bilirubin 0.2 (*)    GFR calc non Af Amer 30 (*)    GFR calc Af Amer 35 (*)    All other components within normal limits  CBC - Abnormal; Notable for the following:    RBC 3.25 (*)    Hemoglobin 9.7 (*)    HCT 31.1 (*)    RDW 18.1 (*)    All other components within normal limits  PROTIME-INR - Abnormal; Notable for the following:    Prothrombin Time 17.0 (*)    All other components within normal limits  GLUCOSE, CAPILLARY - Abnormal; Notable for the following:    Glucose-Capillary 143 (*)    All other components within normal limits  GLUCOSE, CAPILLARY - Abnormal; Notable for the following:    Glucose-Capillary 143 (*)    All other components within normal limits  GLUCOSE, CAPILLARY - Abnormal; Notable for the following:    Glucose-Capillary 152 (*)    All other components within normal limits  GLUCOSE, CAPILLARY - Abnormal; Notable for the following:    Glucose-Capillary 158 (*)    All other components within normal limits  GLUCOSE, CAPILLARY - Abnormal; Notable for the following:    Glucose-Capillary 175 (*)    All other components within normal limits  GLUCOSE, CAPILLARY - Abnormal; Notable for the following:    Glucose-Capillary 168 (*)    All other components within normal limits  BASIC METABOLIC PANEL - Abnormal; Notable for the following:    CO2 17 (*)    Glucose, Bld 287 (*)    BUN 56 (*)    Creatinine, Ser 2.25 (*)    GFR calc non Af Amer 31 (*)    GFR calc Af Amer 36 (*)    All other components within normal limits  GLUCOSE, CAPILLARY - Abnormal; Notable for the following:    Glucose-Capillary 236 (*)  All other components within normal limits  GLUCOSE, CAPILLARY - Abnormal;  Notable for the following:    Glucose-Capillary 277 (*)    All other components within normal limits  GLUCOSE, CAPILLARY - Abnormal; Notable for the following:    Glucose-Capillary 237 (*)    All other components within normal limits  POCT I-STAT, CHEM 8 - Abnormal; Notable for the following:    Potassium 5.5 (*)    Chloride 119 (*)    BUN 65 (*)    Creatinine, Ser 2.60 (*)    Glucose, Bld 43 (*)    Calcium, Ion 1.29 (*)    Hemoglobin 11.6 (*)    HCT 34.0 (*)    All other components within normal limits  URINE CULTURE  STONE ANALYSIS  URINE MICROSCOPIC-ADD ON  HEMOGLOBIN A1C  GLUCOSE, CAPILLARY  PROTIME-INR  CBC  BASIC METABOLIC PANEL   Ct Abdomen Pelvis Wo Contrast  08/04/2012   *RADIOLOGY REPORT*  Clinical Data: Kidney stones.  Abdominal pain.  Dysuria.  Frequent urination.  CT ABDOMEN AND PELVIS WITHOUT CONTRAST  Technique:  Multidetector CT imaging of the abdomen and pelvis was performed following the standard protocol without intravenous contrast.  Comparison: 07/01/2012.  Findings: Lung Bases: Dependent atelectasis. Coronary artery atherosclerosis is present. If office based assessment of coronary risk factors has not been performed, it is now recommended.  Liver:  Unenhanced CT was performed per clinician order.  Lack of IV contrast limits sensitivity and specificity, especially for evaluation of abdominal/pelvic solid viscera.  Riedel's lobe of the liver, normal variant.  No gross mass lesion.  Spleen:  Normal.  Gallbladder:  Cholelithiasis.  No CT evidence of acute cholecystitis.  Common bile duct:  Normal.  Pancreas:  Atrophic.  No acute abnormality.  Adrenal glands:  Normal.  Kidneys:  Unchanged bilateral nonobstructing renal collecting system calculi.  The ureters appear within normal limits.  The distal ureters and UVJ are obscured by artifact from total hip arthroplasties.  Stomach:  Grossly normal.  Small bowel:  Grossly normal.  Colon:   Normal appendix.  Colonic  diverticulosis.  No inflammatory changes are visualized.  Redundant sigmoid.  Pelvic Genitourinary:  No free fluid.  Urinary bladder decompressed.  Mural thickening of the bladder is likely secondary to under distention.  Bones:  Bilateral total hip arthroplasties.  The thoracolumbar spondylosis.  Chronic lower thoracic compression fractures with unchanged alignment compared to prior.  Exaggerated thoracolumbar kyphosis. Heterotopic bone around the hip arthroplasties. Chronic calcification in the left iliopsoas muscle with psoas muscular atrophy.  Vasculature: Atherosclerosis without acute abnormality.  Body Wall: The patient's abdomen is protuberant and portions extend off the field of scan.  Fat containing bilateral inguinal hernias.  IMPRESSION:  1.  Bilateral nonobstructing renal calculi.  No hydronephrosis or ureteral calculi identified. 2. Cholelithiasis. 3.  No acute abnormality.   Original Report Authenticated By: Dereck Ligas, M.D.     1. Acute renal failure   2. Hypoglycemia   3. Hyperkalemia   4. ARF (acute renal failure)   5. Hypoglycemia associated with diabetes   6. Hypothyroidism   7. Type II or unspecified type diabetes mellitus without mention of complication, not stated as uncontrolled   8. Unspecified essential hypertension       MDM          Dot Lanes, MD 08/05/12 [phone]

## 2012-08-04 NOTE — ED Notes (Signed)
Patient returned from CT

## 2012-08-04 NOTE — ED Notes (Signed)
MD at bedside. Hospitalist at bedside. CBG 85mg /dl. Hospitalist aware.

## 2012-08-04 NOTE — Progress Notes (Signed)
ANTICOAGULATION CONSULT NOTE - Initial Consult  Pharmacy Consult for Coumadin Indication: Hx DVT  No Known Allergies  Patient Measurements:     Vital Signs: Temp: 98.2 F (36.8 C) (06/08 1549) BP: 102/55 mmHg (06/08 1950) Pulse Rate: 68 (06/08 1950)  Labs:  Recent Labs  08/04/12 1626 08/04/12 1641 08/04/12 1831  HGB 11.6* 10.6*  --   HCT 34.0* 33.7*  --   PLT  --  250  --   LABPROT  --   --  18.1*  INR  --   --  1.55*  CREATININE 2.60* 2.35*  --     The CrCl is unknown because both a height and weight (above a minimum accepted value) are required for this calculation.   Medical History: Past Medical History  Diagnosis Date  . Sleep apnea     uses cpap  . Hypothyroidism   . Anemia   . Blood transfusion   . Chronic kidney disease     hx of kidney stones  . Hypertension   . Arthritis     osteoarthritis  . Pneumonia   . Diabetes mellitus     insulin dependent    Medications:  Prescriptions prior to admission  Medication Sig Dispense Refill  . ALPHA LIPOIC ACID PO Take 1 tablet by mouth daily.        Marland Kitchen AMLODIPINE BESYLATE PO Take 5 mg by mouth daily.       . Ascorbic Acid (VITAMIN C PO) Take 1 tablet by mouth daily.        . ATORVASTATIN CALCIUM PO Take 40 mg by mouth daily.       . benazepril (LOTENSIN) 20 MG tablet Take 20 mg by mouth every morning.       . ferrous sulfate 325 (65 FE) MG tablet Take 325 mg by mouth 2 (two) times daily.        Marland Kitchen FOLIC ACID PO Take 1 tablet by mouth daily.        Marland Kitchen GARLIC PO Take 1 tablet by mouth daily.        Marland Kitchen gemfibrozil (LOPID) 600 MG tablet Take 600 mg by mouth 2 (two) times daily before a meal.      . GLYBURIDE PO Take 2.5 mg by mouth 2 (two) times daily.       Marland Kitchen HYDROcodone-acetaminophen (NORCO/VICODIN) 5-325 MG per tablet Take 1 tablet by mouth every 6 (six) hours as needed for pain.      Marland Kitchen insulin aspart (NOVOLOG) 100 UNIT/ML injection Inject into the skin 2 (two) times daily. 100-150=3u, 151-200=4u, 201-250-7,  251-300=9, 301-350=11, over 351=14u      . insulin glargine (LANTUS) 100 UNIT/ML injection Inject 30 Units into the skin 2 (two) times daily.       Marland Kitchen levothyroxine (SYNTHROID, LEVOTHROID) 200 MCG tablet Take 200 mcg by mouth daily.        . Multiple Vitamins-Minerals (MULTIVITAMINS THER. W/MINERALS) TABS Take 1 tablet by mouth daily.        . nabumetone (RELAFEN) 750 MG tablet Take 750 mg by mouth 2 (two) times daily.       . Omega-3 Fatty Acids (FISH OIL PO) Take 3 tablets by mouth 2 (two) times daily.        . potassium chloride (K-DUR) 10 MEQ tablet Take 10 mEq by mouth 3 (three) times daily.      . Tamsulosin HCl (FLOMAX) 0.4 MG CAPS Take 0.4 mg by mouth daily.        Marland Kitchen  VITAMIN E PO Take 1 tablet by mouth daily.        Marland Kitchen warfarin (COUMADIN) 3 MG tablet Take 3-4.5 mg by mouth daily. 3mg , 3mg , 4.5mg  then repeat      . CROMOLYN SODIUM PO Take 1 tablet by mouth daily.       . Cyanocobalamin (VITAMIN B 12 PO) Take 1 tablet by mouth daily.         Scheduled:  . cefTRIAXone (ROCEPHIN)  IV  1 g Intravenous Q24H  . [START ON 08/05/2012] levothyroxine  200 mcg Oral QAC breakfast  . rosuvastatin  20 mg Oral QHS  . sodium chloride  3 mL Intravenous Q12H   Infusions:  . dextrose 5 % and 0.9% NaCl     PRN: acetaminophen, acetaminophen, albuterol, HYDROcodone-acetaminophen, HYDROmorphone (DILAUDID) injection, ondansetron (ZOFRAN) IV, ondansetron  Assessment: 56 YOM on Coumadin PTA for Hx DVT.  Admitted6/8 w/ frequent urination and pain. Pharmacy to manage Coumadin while inpt.  Dose PTA: 3mg , 3mg , 4.5mg  then repeat cycle. Last dose 6/7 = 4.5mg .   INR subtx on admission  No bleeding reported  Goal of Therapy:  INR 2-3   Plan:  Coumadin 5mg  boost dose tonight Daily INR  Marcell Anger PharmD  [phone] 08/04/2012 8:24 PM

## 2012-08-04 NOTE — Progress Notes (Deleted)
[facility] History and Physical  [name] [id number] DOB: 1955-10-26 DOA: 08/04/2012   PCP: [name], MD  Specialists: He Dr. Dorina Hoyer is his urologist  Chief Complaint: Frequent urination, and pain  HPI: [name] is a 57 y.o. male with a past medical history of, nephrolithiasis, diabetes, peripheral vascular disease, status post left below-knee amputation, hypertension, DVT, who was in his usual state of health till a few days ago, when he started having frequent urination. He had painful urination. He saw blood in the urine. The pain was 8/10 in intensity. It has been pretty constant. Denies any fever or chills. No nausea, vomiting, or diarrhea. Denies any cough. He's had poor appetite and poor oral intake the last few days because of his symptoms. He tells me that this has been ongoing for the last 6 months. He had ureteral stents placed in February and they were removed in March. However, he has had constant problems with the frequent urination. He actually passed a stone a few days ago, which he has by his bedside. Denies any chest pain or shortness of breath.  Home Medications: Prior to Admission medications   Medication Sig Start Date End Date Taking? Authorizing Provider  ALPHA LIPOIC ACID PO Take 1 tablet by mouth daily.     Yes Historical Provider, MD  AMLODIPINE BESYLATE PO Take 5 mg by mouth daily.    Yes Historical Provider, MD  Ascorbic Acid (VITAMIN C PO) Take 1 tablet by mouth daily.     Yes Historical Provider, MD  ATORVASTATIN CALCIUM PO Take 40 mg by mouth daily.    Yes Historical Provider, MD  benazepril (LOTENSIN) 20 MG tablet Take 20 mg by mouth every morning.    Yes Historical Provider, MD  ferrous sulfate 325 (65 FE) MG tablet Take 325 mg by mouth 2 (two) times daily.     Yes Historical Provider, MD  FOLIC ACID PO Take 1 tablet by mouth daily.     Yes Historical Provider, MD  GARLIC PO Take 1 tablet by mouth daily.     Yes Historical Provider,  MD  gemfibrozil (LOPID) 600 MG tablet Take 600 mg by mouth 2 (two) times daily before a meal.   Yes Historical Provider, MD  GLYBURIDE PO Take 2.5 mg by mouth 2 (two) times daily.    Yes Historical Provider, MD  HYDROcodone-acetaminophen (NORCO/VICODIN) 5-325 MG per tablet Take 1 tablet by mouth every 6 (six) hours as needed for pain.   Yes Historical Provider, MD  insulin aspart (NOVOLOG) 100 UNIT/ML injection Inject into the skin 2 (two) times daily. 100-150=3u, 151-200=4u, 201-250-7, 251-300=9, 301-350=11, over 351=14u   Yes Historical Provider, MD  insulin glargine (LANTUS) 100 UNIT/ML injection Inject 30 Units into the skin 2 (two) times daily.    Yes Historical Provider, MD  levothyroxine (SYNTHROID, LEVOTHROID) 200 MCG tablet Take 200 mcg by mouth daily.     Yes Historical Provider, MD  Multiple Vitamins-Minerals (MULTIVITAMINS THER. W/MINERALS) TABS Take 1 tablet by mouth daily.     Yes Historical Provider, MD  nabumetone (RELAFEN) 750 MG tablet Take 750 mg by mouth 2 (two) times daily.    Yes Historical Provider, MD  Omega-3 Fatty Acids (FISH OIL PO) Take 3 tablets by mouth 2 (two) times daily.     Yes Historical Provider, MD  potassium chloride (K-DUR) 10 MEQ tablet Take 10 mEq by mouth 3 (three) times daily.   Yes Historical Provider, MD  Tamsulosin HCl (FLOMAX) 0.4 MG  CAPS Take 0.4 mg by mouth daily.     Yes Historical Provider, MD  VITAMIN E PO Take 1 tablet by mouth daily.     Yes Historical Provider, MD  warfarin (COUMADIN) 3 MG tablet Take 3-4.5 mg by mouth daily. 3mg , 3mg , 4.5mg  then repeat   Yes Historical Provider, MD  CROMOLYN SODIUM PO Take 1 tablet by mouth daily.     Historical Provider, MD  Cyanocobalamin (VITAMIN B 12 PO) Take 1 tablet by mouth daily.      Historical Provider, MD    Allergies: No Known Allergies  Past Medical History: Past Medical History  Diagnosis Date  . Sleep apnea     uses cpap  . Hypothyroidism   . Anemia   . Blood transfusion   . Chronic  kidney disease     hx of kidney stones  . Hypertension   . Arthritis     osteoarthritis  . Pneumonia   . Diabetes mellitus     insulin dependent    Past Surgical History  Procedure Laterality Date  . Bilteral hip      replacement & revison  's   . Knee surgery      left knee    . Tonsillectomy    . Amputation  02/07/2011    Procedure: AMPUTATION BELOW KNEE;  Surgeon: Newt Minion, MD;  Location: [facility];  Service: Orthopedics;  Laterality: Left;  Left Below Knee Amputation    Social History:  reports that he has never smoked. He has never used smokeless tobacco. He reports that he does not drink alcohol or use illicit drugs.  Living Situation: He lives in [place] with his wife Activity Level: His amputee and uses a wheelchair to get around. He is waiting on a prosthesis.   Family History:  Family History  Problem Relation Age of Onset  . Diabetes Mother   . Colon cancer Father      Review of Systems - History obtained from the patient General ROS: positive for  - fatigue Psychological ROS: negative Ophthalmic ROS: negative ENT ROS: negative Allergy and Immunology ROS: negative Hematological and Lymphatic ROS: negative Endocrine ROS: negative Respiratory ROS: no cough, shortness of breath, or wheezing Cardiovascular ROS: no chest pain or dyspnea on exertion Gastrointestinal ROS: no abdominal pain, change in bowel habits, or black or bloody stools Genito-Urinary ROS: as in hpi Musculoskeletal ROS: negative Neurological ROS: no TIA or stroke symptoms Dermatological ROS: negative  Physical Examination  Filed Vitals:   08/04/12 1549 08/04/12 1646  BP: 100/58 93/50  Pulse: 100 87  Temp: 98.2 F (36.8 C)   Resp: 16 16  SpO2: 100% 100%    General appearance: alert, cooperative, appears stated age, no distress and morbidly obese Head: Normocephalic, without obvious abnormality, atraumatic Eyes: conjunctivae/corneas clear. PERRL, EOM's intact.  Throat: Dry  mucus membranes Neck: no adenopathy, no carotid bruit, no JVD, supple, symmetrical, trachea midline and thyroid not enlarged, symmetric, no tenderness/mass/nodules Back: symmetric, no curvature. ROM normal. No CVA tenderness. Resp: clear to auscultation bilaterally Cardio: regular rate and rhythm, S1, S2 normal, no murmur, click, rub or gallop GI: soft, non-tender; bowel sounds normal; no masses,  no organomegaly Extremities: Left BKA. Right LE shows pedal edema which is his baseline per patient. Pulses: Poorly palpable in lower ext. Skin: chronic skin changes in LE Lymph nodes: Cervical, supraclavicular, and axillary nodes normal. Neurologic: Alert and oriented X 3, normal strength and tone. No focal deficits  Laboratory Data: Results  for orders placed during the hospital encounter of 08/04/12 (from the past 48 hour(s))  URINALYSIS, ROUTINE W REFLEX MICROSCOPIC     Status: Abnormal   Collection Time    08/04/12  3:32 PM      Result Value Range   Color, Urine YELLOW  YELLOW   APPearance TURBID (*) CLEAR   Specific Gravity, Urine 1.015  1.005 - 1.030   pH 5.5  5.0 - 8.0   Glucose, UA NEGATIVE  NEGATIVE mg/dL   Hgb urine dipstick LARGE (*) NEGATIVE   Bilirubin Urine NEGATIVE  NEGATIVE   Ketones, ur NEGATIVE  NEGATIVE mg/dL   Protein, ur 100 (*) NEGATIVE mg/dL   Urobilinogen, UA 0.2  0.0 - 1.0 mg/dL   Nitrite NEGATIVE  NEGATIVE   Leukocytes, UA LARGE (*) NEGATIVE  URINE MICROSCOPIC-ADD ON     Status: None   Collection Time    08/04/12  3:32 PM      Result Value Range   WBC, UA TOO NUMEROUS TO COUNT  <3 WBC/hpf   RBC / HPF TOO NUMEROUS TO COUNT  <3 RBC/hpf   Bacteria, UA RARE  RARE   Urine-Other FIELD OBSCURED BY WBC'S    POCT I-STAT, CHEM 8     Status: Abnormal   Collection Time    08/04/12  4:26 PM      Result Value Range   Sodium 141  135 - 145 mEq/L   Potassium 5.5 (*) 3.5 - 5.1 mEq/L   Chloride 119 (*) 96 - 112 mEq/L   BUN 65 (*) 6 - 23 mg/dL   Creatinine, Ser 2.60 (*)  0.50 - 1.35 mg/dL   Glucose, Bld 43 (*) 70 - 99 mg/dL   Calcium, Ion 1.29 (*) 1.12 - 1.23 mmol/L   TCO2 17  0 - 100 mmol/L   Hemoglobin 11.6 (*) 13.0 - 17.0 g/dL   HCT 34.0 (*) 39.0 - [id number] %  BASIC METABOLIC PANEL     Status: Abnormal   Collection Time    08/04/12  4:41 PM      Result Value Range   Sodium 137  135 - 145 mEq/L   Potassium 5.4 (*) 3.5 - 5.1 mEq/L   Chloride 108  96 - 112 mEq/L   Comment: DELTA CHECK NOTED     REPEATED TO VERIFY   CO2 14 (*) 19 - 32 mEq/L   Glucose, Bld 43 (*) 70 - 99 mg/dL   Comment: CRITICAL RESULT CALLED TO, READ BACK BY AND VERIFIED WITH:     SUSAN COE,RN [id number] @ 1715 BY J SCOTTON   BUN 61 (*) 6 - 23 mg/dL   Creatinine, Ser 2.35 (*) 0.50 - 1.35 mg/dL   Calcium 9.4  8.4 - 10.5 mg/dL   GFR calc non Af Amer 30 (*) >90 mL/min   GFR calc Af Amer 34 (*) >90 mL/min   Comment:            The eGFR has been calculated     using the CKD EPI equation.     This calculation has not been     validated in all clinical     situations.     eGFR's persistently     <90 mL/min signify     possible Chronic Kidney Disease.  CBC WITH DIFFERENTIAL     Status: Abnormal   Collection Time    08/04/12  4:41 PM      Result Value Range   WBC 6.6  4.0 - 10.5  K/uL   RBC 3.55 (*) 4.22 - 5.81 MIL/uL   Hemoglobin 10.6 (*) 13.0 - 17.0 g/dL   HCT 33.7 (*) 39.0 - 52.0 %   MCV 94.9  78.0 - 100.0 fL   MCH 29.9  26.0 - 34.0 pg   MCHC 31.5  30.0 - 36.0 g/dL   RDW 18.0 (*) 11.5 - 15.5 %   Platelets 250  150 - 400 K/uL   Neutrophils Relative % 78 (*) 43 - 77 %   Neutro Abs 5.1  1.7 - 7.7 K/uL   Lymphocytes Relative 11 (*) 12 - 46 %   Lymphs Abs 0.7  0.7 - 4.0 K/uL   Monocytes Relative 8  3 - 12 %   Monocytes Absolute 0.5  0.1 - 1.0 K/uL   Eosinophils Relative 3  0 - 5 %   Eosinophils Absolute 0.2  0.0 - 0.7 K/uL   Basophils Relative 1  0 - 1 %   Basophils Absolute 0.0  0.0 - 0.1 K/uL    Radiology Reports: Ct Abdomen Pelvis Wo Contrast  08/04/2012   *RADIOLOGY REPORT*   Clinical Data: Kidney stones.  Abdominal pain.  Dysuria.  Frequent urination.  CT ABDOMEN AND PELVIS WITHOUT CONTRAST  Technique:  Multidetector CT imaging of the abdomen and pelvis was performed following the standard protocol without intravenous contrast.  Comparison: 07/01/2012.  Findings: Lung Bases: Dependent atelectasis. Coronary artery atherosclerosis is present. If office based assessment of coronary risk factors has not been performed, it is now recommended.  Liver:  Unenhanced CT was performed per clinician order.  Lack of IV contrast limits sensitivity and specificity, especially for evaluation of abdominal/pelvic solid viscera.  Riedel's lobe of the liver, normal variant.  No gross mass lesion.  Spleen:  Normal.  Gallbladder:  Cholelithiasis.  No CT evidence of acute cholecystitis.  Common bile duct:  Normal.  Pancreas:  Atrophic.  No acute abnormality.  Adrenal glands:  Normal.  Kidneys:  Unchanged bilateral nonobstructing renal collecting system calculi.  The ureters appear within normal limits.  The distal ureters and UVJ are obscured by artifact from total hip arthroplasties.  Stomach:  Grossly normal.  Small bowel:  Grossly normal.  Colon:   Normal appendix.  Colonic diverticulosis.  No inflammatory changes are visualized.  Redundant sigmoid.  Pelvic Genitourinary:  No free fluid.  Urinary bladder decompressed.  Mural thickening of the bladder is likely secondary to under distention.  Bones:  Bilateral total hip arthroplasties.  The thoracolumbar spondylosis.  Chronic lower thoracic compression fractures with unchanged alignment compared to prior.  Exaggerated thoracolumbar kyphosis. Heterotopic bone around the hip arthroplasties. Chronic calcification in the left iliopsoas muscle with psoas muscular atrophy.  Vasculature: Atherosclerosis without acute abnormality.  Body Wall: The patient's abdomen is protuberant and portions extend off the field of scan.  Fat containing bilateral inguinal hernias.   IMPRESSION:  1.  Bilateral nonobstructing renal calculi.  No hydronephrosis or ureteral calculi identified. 2. Cholelithiasis. 3.  No acute abnormality.   Original Report Authenticated By: Dereck Ligas, M.D.    Problem List  Principal Problem:   ARF (acute renal failure) Active Problems:   DIABETES MELLITUS-TYPE II   OBESITY   HYPERTENSION   Hyperkalemia   Metabolic acidosis   Hypoglycemia associated with diabetes   Hypothyroidism   Assessment: This is a 57 year old, Caucasian male, with numerous medical problems, who presents with the frequent urination, and dysuria. He is found to have acute renal failure. His baseline renal function is close  to normal per blood work from 2012. He's found to be hypoglycemic and has metabolic acidosis. He also has history of DVT and is on warfarin. PT/INR is pending.  Plan: #1 acute renal failure: Etiology is most likely a combination of the kidney stones, as well as dehydration. CT did not show hydronephrosis and there is no acute obstruction at this time. So, at this time renal failure will be treated with IV fluids. He is making urine. Urine output will be monitored closely.  #2 hyperkalemia, and metabolic acidosis: Anion gap is 15. These are likely due to renal failure. It should get corrected with IV fluids. We will repeat bmet tonight. At this time will hold off on giving anything for his potassium because it should get corrected with fluids. If it stays persistently elevated he will require definitive management.  #3 hypoglycemia in the setting of diabetes, type II: This is most likely due to medications and poor oral intake made worse by the renal failure. He took his glyburide last on Saturday. We will check HbA1c. We will put him on a D5 infusion for now till his blood sugar stabilizes. CBGs will be checked every 2 hours for now. After having orange juice and crackers his blood sugar has improved to the 80s.  #4 history of hypertension: His  blood pressure is somewhat borderline. He is mentating completely normally. We will hold his antihypertensive regimen. We will reinitiate as his blood pressures stabilizes.  #5 nephrolithiasis/UTI: He did pass a stone, which he has with him. We'll send it for stone analysis. I discussed with Dr. Gaynelle Arabian. He requested [place] to call Dr. Dorina Hoyer in the morning. There is no need for any acute intervention at this time. Antibiotics. Will be initiated for possibility of UTI. Urine cultures are pending. Pyridium is contraindicated due to acute renal failure.  #6 history of hypothyroidism: Continue with levothyroxine.  #7 history of DVTs: Will check a stat PT and INR. Continue with warfarin per pharmacy.  DVT Prophylaxis: He is on warfarin. We'll check PT/INR Code Status: He is a full code Family Communication: Discussed with the patient and his wife  Disposition Plan: Will be admitted to telemetry for now.   Further management decisions will depend on results of further testing and patient's response to treatment.  [facility]  [facility] Pager [phone]  If 7PM-7AM, please contact night-coverage [email or web address] Password [facility]  08/04/2012, 6:32 PM

## 2012-08-04 NOTE — ED Notes (Signed)
Pt not wanting EKG at this time, pt wants to sit on the side of the bed with the urinal.

## 2012-08-04 NOTE — ED Notes (Signed)
Pt given orange juice and peanut butter and crackers per order Dr. Audie Pinto in response to glucose level 43mg /dl.

## 2012-08-04 NOTE — ED Notes (Signed)
Report called to floor RN, Brayton Layman

## 2012-08-04 NOTE — ED Notes (Signed)
Pt c/o lower groin pain since last week. Pt states he passed a "chip" of a kidney stone last week. Pt states he has a hx of passing kidney stones. Pt also reports that he has to urinate about every 10 min, but is not able to fully empty his bladder. Pt a/o x 4. Skin warm, dry. Family at bedside.

## 2012-08-04 NOTE — ED Notes (Signed)
MD at bedside. Dr. Audie Pinto at bedside.

## 2012-08-05 DIAGNOSIS — I10 Essential (primary) hypertension: Secondary | ICD-10-CM

## 2012-08-05 LAB — COMPREHENSIVE METABOLIC PANEL
AST: 11 U/L (ref 0–37)
Albumin: 3 g/dL — ABNORMAL LOW (ref 3.5–5.2)
BUN: 61 mg/dL — ABNORMAL HIGH (ref 6–23)
Chloride: 107 mEq/L (ref 96–112)
Creatinine, Ser: 2.33 mg/dL — ABNORMAL HIGH (ref 0.50–1.35)
Potassium: 5.8 mEq/L — ABNORMAL HIGH (ref 3.5–5.1)
Total Protein: 7.1 g/dL (ref 6.0–8.3)

## 2012-08-05 LAB — HEMOGLOBIN A1C: Mean Plasma Glucose: 94 mg/dL (ref <117)

## 2012-08-05 LAB — CBC
HCT: 31.1 % — ABNORMAL LOW (ref 39.0–52.0)
MCH: 29.8 pg (ref 26.0–34.0)
MCV: 95.7 fL (ref 78.0–100.0)
RBC: 3.25 MIL/uL — ABNORMAL LOW (ref 4.22–5.81)
WBC: 5.3 10*3/uL (ref 4.0–10.5)

## 2012-08-05 LAB — GLUCOSE, CAPILLARY
Glucose-Capillary: 143 mg/dL — ABNORMAL HIGH (ref 70–99)
Glucose-Capillary: 152 mg/dL — ABNORMAL HIGH (ref 70–99)
Glucose-Capillary: 158 mg/dL — ABNORMAL HIGH (ref 70–99)
Glucose-Capillary: 237 mg/dL — ABNORMAL HIGH (ref 70–99)
Glucose-Capillary: 87 mg/dL (ref 70–99)

## 2012-08-05 LAB — BASIC METABOLIC PANEL
BUN: 56 mg/dL — ABNORMAL HIGH (ref 6–23)
CO2: 17 mEq/L — ABNORMAL LOW (ref 19–32)
Calcium: 8.8 mg/dL (ref 8.4–10.5)
Creatinine, Ser: 2.25 mg/dL — ABNORMAL HIGH (ref 0.50–1.35)
Glucose, Bld: 287 mg/dL — ABNORMAL HIGH (ref 70–99)

## 2012-08-05 LAB — MRSA PCR SCREENING: MRSA by PCR: POSITIVE — AB

## 2012-08-05 MED ORDER — MUPIROCIN 2 % EX OINT
1.0000 "application " | TOPICAL_OINTMENT | Freq: Two times a day (BID) | CUTANEOUS | Status: DC
  Administered 2012-08-05 – 2012-08-07 (×6): 1 via NASAL
  Filled 2012-08-05: qty 22

## 2012-08-05 MED ORDER — SODIUM POLYSTYRENE SULFONATE 15 GM/60ML PO SUSP
30.0000 g | Freq: Once | ORAL | Status: AC
  Administered 2012-08-05: 30 g via ORAL
  Filled 2012-08-05 (×2): qty 120

## 2012-08-05 MED ORDER — INSULIN ASPART 100 UNIT/ML ~~LOC~~ SOLN
0.0000 [IU] | Freq: Every day | SUBCUTANEOUS | Status: DC
  Administered 2012-08-05: 21:00:00 via SUBCUTANEOUS

## 2012-08-05 MED ORDER — INSULIN ASPART 100 UNIT/ML ~~LOC~~ SOLN: 0.0000 [IU] | Freq: Three times a day (TID) | SUBCUTANEOUS | Status: DC

## 2012-08-05 MED ORDER — CHLORHEXIDINE GLUCONATE CLOTH 2 % EX PADS
6.0000 | MEDICATED_PAD | Freq: Every day | CUTANEOUS | Status: DC
  Administered 2012-08-05 – 2012-08-07 (×2): 6 via TOPICAL

## 2012-08-05 MED ORDER — WARFARIN SODIUM 5 MG PO TABS
5.0000 mg | ORAL_TABLET | Freq: Once | ORAL | Status: AC
  Administered 2012-08-05: 5 mg via ORAL
  Filled 2012-08-05: qty 1

## 2012-08-05 MED ORDER — SODIUM CHLORIDE 0.9 % IV SOLN
INTRAVENOUS | Status: DC
  Administered 2012-08-05 (×2): via INTRAVENOUS
  Administered 2012-08-06: 1000 mL via INTRAVENOUS
  Administered 2012-08-06 (×2): via INTRAVENOUS

## 2012-08-05 NOTE — Progress Notes (Signed)
[facility] PROGRESS NOTE  [name] [id number] DOB: [birth year] DOA: 08/04/2012  PCP: [name], MD  Brief HPI: [name] is a 57 y.o. male with a past medical history of, nephrolithiasis, diabetes, peripheral vascular disease, status post left below-knee amputation, hypertension, DVT, who was in his usual state of health till a few days ago, when he started having frequent urination. He had painful urination. He saw blood in the urine. The pain was 8/10 in intensity. It has been pretty constant. Denied any fever or chills. No nausea, vomiting, or diarrhea. Denied any cough. He's had poor appetite and poor oral intake the last few days because of his symptoms. He mentioned that this has been ongoing for the last 6 months. He had ureteral stents placed in February and they were removed in March. However, he has had constant problems with the frequent urination. He actually passed a stone a few days ago, which he has by his bedside.   Past medical history:  Past Medical History  Diagnosis Date  . Sleep apnea     uses cpap  . Hypothyroidism   . Anemia   . Blood transfusion   . Chronic kidney disease     hx of kidney stones  . Hypertension   . Arthritis     osteoarthritis  . Pneumonia   . Diabetes mellitus     insulin dependent    Consultants: Urology  Procedures: None  Antibiotics: Ceftriaxone 6/8  Subjective: Patient feels no different. Still with burning pain with urination. No abdominal pain. No new complaints.  Objective: Vital Signs  Filed Vitals:   08/04/12 1646 08/04/12 1950 08/04/12 2042 08/05/12 0444  BP: 93/50 102/55 123/70 99/57  Pulse: 87 68 96 88  Temp:   97.4 F (36.3 C) 97.6 F (36.4 C)  TempSrc:   Oral Oral  Resp: 16 17 18 18   Height:   5\' 11"  (1.803 m)   Weight:   132 kg (291 lb 0.1 oz)   SpO2: 100% 100% 100% 99%    Intake/Output Summary (Last 24 hours) at 08/05/12 [id number] Last data filed at 08/05/12 0800  Gross per 24 hour   Intake    410 ml  Output   1725 ml  Net  -1315 ml   Filed Weights   08/04/12 2042  Weight: 132 kg (291 lb 0.1 oz)    General appearance: alert, cooperative, appears stated age, no distress and morbidly obese  Resp: clear to auscultation bilaterally  Cardio: regular rate and rhythm, S1, S2 normal, no murmur, click, rub or gallop  GI: soft, non-tender; bowel sounds normal; no masses, no organomegaly  Extremities: Left BKA. Right LE shows pedal edema which is his baseline per patient.  Skin: chronic skin changes in LE  Neurologic: Alert and oriented X 3, normal strength and tone. No focal deficits   Lab Results:  Basic Metabolic Panel:  Recent Labs Lab 08/04/12 1626 08/04/12 1641 08/04/12 2200 08/05/12 0115  NA 141 137 136 135  K 5.5* 5.4* 5.7* 5.8*  CL 119* 108 108 107  CO2  --  14* 15* 15*  GLUCOSE 43* 43* 159* 159*  BUN 65* 61* 59* 61*  CREATININE 2.60* 2.35* 2.36* 2.33*  CALCIUM  --  9.4 8.9 9.0   Liver Function Tests:  Recent Labs Lab 08/05/12 0115  AST 11  ALT 9  ALKPHOS 56  BILITOT 0.2*  PROT 7.1  ALBUMIN 3.0*   CBC:  Recent Labs Lab 08/04/12 1626 08/04/12  1641 08/05/12 0115  WBC  --  6.6 5.3  NEUTROABS  --  5.1  --   HGB 11.6* 10.6* 9.7*  HCT 34.0* 33.7* 31.1*  MCV  --  94.9 95.7  PLT  --  250 177   CBG:  Recent Labs Lab 08/04/12 2358 08/05/12 0227 08/05/12 0422 08/05/12 0629 08/05/12 0735  GLUCAP 143* 152* 158* 175* 168*    Recent Results (from the past 240 hour(s))  MRSA PCR SCREENING     Status: Abnormal   Collection Time    08/04/12 10:27 PM      Result Value Range Status   MRSA by PCR POSITIVE (*) NEGATIVE Final   Comment:            The GeneXpert MRSA Assay (FDA     approved for NASAL specimens     only), is one component of a     comprehensive MRSA colonization     surveillance program. It is not     intended to diagnose MRSA     infection nor to guide or     monitor treatment for     MRSA infections.     RESULT  CALLED TO, READ BACK BY AND VERIFIED WITH:     CLAYTON,M RN AT 0028 ON [id number] BY MCREYNOLDS,B      Studies/Results: Ct Abdomen Pelvis Wo Contrast  08/04/2012   *RADIOLOGY REPORT*  Clinical Data: Kidney stones.  Abdominal pain.  Dysuria.  Frequent urination.  CT ABDOMEN AND PELVIS WITHOUT CONTRAST  Technique:  Multidetector CT imaging of the abdomen and pelvis was performed following the standard protocol without intravenous contrast.  Comparison: 07/01/2012.  Findings: Lung Bases: Dependent atelectasis. Coronary artery atherosclerosis is present. If office based assessment of coronary risk factors has not been performed, it is now recommended.  Liver:  Unenhanced CT was performed per clinician order.  Lack of IV contrast limits sensitivity and specificity, especially for evaluation of abdominal/pelvic solid viscera.  Riedel's lobe of the liver, normal variant.  No gross mass lesion.  Spleen:  Normal.  Gallbladder:  Cholelithiasis.  No CT evidence of acute cholecystitis.  Common bile duct:  Normal.  Pancreas:  Atrophic.  No acute abnormality.  Adrenal glands:  Normal.  Kidneys:  Unchanged bilateral nonobstructing renal collecting system calculi.  The ureters appear within normal limits.  The distal ureters and UVJ are obscured by artifact from total hip arthroplasties.  Stomach:  Grossly normal.  Small bowel:  Grossly normal.  Colon:   Normal appendix.  Colonic diverticulosis.  No inflammatory changes are visualized.  Redundant sigmoid.  Pelvic Genitourinary:  No free fluid.  Urinary bladder decompressed.  Mural thickening of the bladder is likely secondary to under distention.  Bones:  Bilateral total hip arthroplasties.  The thoracolumbar spondylosis.  Chronic lower thoracic compression fractures with unchanged alignment compared to prior.  Exaggerated thoracolumbar kyphosis. Heterotopic bone around the hip arthroplasties. Chronic calcification in the left iliopsoas muscle with psoas muscular atrophy.   Vasculature: Atherosclerosis without acute abnormality.  Body Wall: The patient's abdomen is protuberant and portions extend off the field of scan.  Fat containing bilateral inguinal hernias.  IMPRESSION:  1.  Bilateral nonobstructing renal calculi.  No hydronephrosis or ureteral calculi identified. 2. Cholelithiasis. 3.  No acute abnormality.   Original Report Authenticated By: Dereck Ligas, M.D.    Medications:  Scheduled: . cefTRIAXone (ROCEPHIN)  IV  1 g Intravenous Q24H  . Chlorhexidine Gluconate Cloth  6 each Topical Q0600  .  levothyroxine  200 mcg Oral QAC breakfast  . mupirocin ointment  1 application Nasal BID  . rosuvastatin  20 mg Oral QHS  . sodium chloride  3 mL Intravenous Q12H  . sodium polystyrene  30 g Oral Once  . Warfarin - Pharmacist Dosing Inpatient   Does not apply q1800   Continuous: . sodium chloride     [id number], acetaminophen, albuterol, HYDROcodone-acetaminophen, HYDROmorphone (DILAUDID) injection, ondansetron (ZOFRAN) IV, ondansetron  Assessment/Plan:  Principal Problem:   ARF (acute renal failure) Active Problems:   DIABETES MELLITUS-TYPE II   OBESITY   HYPERTENSION   Hyperkalemia   Metabolic acidosis   Hypoglycemia associated with diabetes   Hypothyroidism    Acute renal failure Etiology is most likely a combination of the kidney stones, as well as dehydration. CT did not show hydronephrosis and there is no acute obstruction at this time. Creatinine is stable. No better but no worse either. Last labs from 2012 showed close to normal creatinine. Some element of CKD cannot be entirely ruled out but appears to be acute at this time. Continue with IVF. Monitor labs daily. Monitor UO.  Hyperkalemia, and metabolic acidosis Potassium remains elevated. Will give kayexalate and repeat labs again. Acidosis is stable. Monitor for now.   Hypoglycemia in the setting of diabetes, type II This was most likely due to medications and poor oral intake  made worse by the renal failure. He took his glyburide last on Saturday. HbA1c is pending. CBG is better. He is eating better. Will change to NS infusion. Change CBG q4h. Continue to hold insulin.  History of hypertension His blood pressure remains somewhat borderline. Though he remains asymptomatic. Continue to hold his antihypertensive regimen.   Nephrolithiasis/UTI He did pass a stone, which was sent for stone analysis. Await Urology input. Continue ceftriaxone. Await urine culture. Pyridium is contraindicated due to acute renal failure.   History of hypothyroidism Continue with levothyroxine.   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 INR is subtherapeutic. Pharmacy managing warfarin. Don't anticipate any procedures currently.   DVT Prophylaxis: He is on warfarin. Code Status: He is a full code  Family Communication: Discussed with the patient  Disposition Plan: Not ready for discharge. Will likely return home when improved.     LOS: 1 day   [facility] Pager [phone] 08/05/2012, 9:17 AM  If 8PM-8AM, please contact night-coverage at [email or web address], password [facility]

## 2012-08-05 NOTE — H&P (Signed)
[facility] History and Physical   MARIOS YELLOWHAIR [id number] DOB: 06-May-1955 DOA: 08/04/2012     PCP: [name], MD   Specialists: He Dr. Dorina Hoyer is his urologist   Chief Complaint: Frequent urination, and pain   HPI: BAYLEE WHITEHALL is a 57 y.o. male with a past medical history of, nephrolithiasis, diabetes, peripheral vascular disease, status post left below-knee amputation, hypertension, DVT, who was in his usual state of health till a few days ago, when he started having frequent urination. He had painful urination. He saw blood in the urine. The pain was 8/10 in intensity. It has been pretty constant. Denies any fever or chills. No nausea, vomiting, or diarrhea. Denies any cough. He's had poor appetite and poor oral intake the last few days because of his symptoms. He tells me that this has been ongoing for the last 6 months. He had ureteral stents placed in February and they were removed in March. However, he has had constant problems with the frequent urination. He actually passed a stone a few days ago, which he has by his bedside. Denies any chest pain or shortness of breath.   Home Medications: Medications Prior to Admission  Medication Sig Dispense Refill  . ALPHA LIPOIC ACID PO Take 1 tablet by mouth daily.        Marland Kitchen AMLODIPINE BESYLATE PO Take 5 mg by mouth daily.       . Ascorbic Acid (VITAMIN C PO) Take 1 tablet by mouth daily.        . ATORVASTATIN CALCIUM PO Take 40 mg by mouth daily.       . benazepril (LOTENSIN) 20 MG tablet Take 20 mg by mouth every morning.       . ferrous sulfate 325 (65 FE) MG tablet Take 325 mg by mouth 2 (two) times daily.        Marland Kitchen FOLIC ACID PO Take 1 tablet by mouth daily.        Marland Kitchen GARLIC PO Take 1 tablet by mouth daily.        Marland Kitchen gemfibrozil (LOPID) 600 MG tablet Take 600 mg by mouth 2 (two) times daily before a meal.      . GLYBURIDE PO Take 2.5 mg by mouth 2 (two) times daily.       Marland Kitchen HYDROcodone-acetaminophen (NORCO/VICODIN)  5-325 MG per tablet Take 1 tablet by mouth every 6 (six) hours as needed for pain.      Marland Kitchen insulin aspart (NOVOLOG) 100 UNIT/ML injection Inject into the skin 2 (two) times daily. 100-150=3u, 151-200=4u, 201-250-7, 251-300=9, 301-350=11, over 351=14u      . insulin glargine (LANTUS) 100 UNIT/ML injection Inject 30 Units into the skin 2 (two) times daily.       Marland Kitchen levothyroxine (SYNTHROID, LEVOTHROID) 200 MCG tablet Take 200 mcg by mouth daily.        . Multiple Vitamins-Minerals (MULTIVITAMINS THER. W/MINERALS) TABS Take 1 tablet by mouth daily.        . nabumetone (RELAFEN) 750 MG tablet Take 750 mg by mouth 2 (two) times daily.       . Omega-3 Fatty Acids (FISH OIL PO) Take 3 tablets by mouth 2 (two) times daily.        . potassium chloride (K-DUR) 10 MEQ tablet Take 10 mEq by mouth 3 (three) times daily.      . Tamsulosin HCl (FLOMAX) 0.4 MG CAPS Take 0.4 mg by mouth daily.        Marland Kitchen VITAMIN  E PO Take 1 tablet by mouth daily.        Marland Kitchen warfarin (COUMADIN) 3 MG tablet Take 3-4.5 mg by mouth daily. 3mg , 3mg , 4.5mg  then repeat      . CROMOLYN SODIUM PO Take 1 tablet by mouth daily.       . Cyanocobalamin (VITAMIN B 12 PO) Take 1 tablet by mouth daily.           Allergies: No Known Allergies   Past Medical History: Past Medical History   Diagnosis  Date   .  Sleep apnea         uses cpap   .  Hypothyroidism     .  Anemia     .  Blood transfusion     .  Chronic kidney disease         hx of kidney stones   .  Hypertension     .  Arthritis         osteoarthritis   .  Pneumonia     .  Diabetes mellitus         insulin dependent       Past Surgical History   .  Bilteral hip           replacement & revison  's    .  Knee surgery           left knee     .  Tonsillectomy       .  Amputation    02/07/2011       Procedure: AMPUTATION BELOW KNEE;  Surgeon: Newt Minion, MD;  Location: [place];  Service: Orthopedics;  Laterality: Left;  Left Below Knee Amputation      Social History:  reports that he has never smoked. He has never used smokeless tobacco. He reports that he does not drink alcohol or use illicit drugs.   Living Situation: He lives in [place] with his wife Activity Level: His amputee and uses a wheelchair to get around. He is waiting on a prosthesis.    Family History:   Family History   Problem  Relation  Age of Onset   .  Diabetes  Mother     .  Colon cancer  Father          Review of Systems - History obtained from the patient General ROS: positive for  - fatigue Psychological ROS: negative Ophthalmic ROS: negative ENT ROS: negative Allergy and Immunology ROS: negative Hematological and Lymphatic ROS: negative Endocrine ROS: negative Respiratory ROS: no cough, shortness of breath, or wheezing Cardiovascular ROS: no chest pain or dyspnea on exertion Gastrointestinal ROS: no abdominal pain, change in bowel habits, or black or bloody stools Genito-Urinary ROS: as in hpi Musculoskeletal ROS: negative Neurological ROS: no TIA or stroke symptoms Dermatological ROS: negative   Physical Examination    Filed Vitals:     08/04/12 1549  08/04/12 1646   BP:  100/58  93/50   Pulse:  100  87   Temp:  98.2 F (36.8 C)     Resp:  16  16   SpO2:  100%  100%      General appearance: alert, cooperative, appears stated age, no distress and morbidly obese Head: Normocephalic, without obvious abnormality, atraumatic Eyes: conjunctivae/corneas clear. PERRL, EOM's intact.   Throat: Dry mucus membranes Neck: no adenopathy, no carotid bruit, no JVD, supple, symmetrical, trachea midline and thyroid not enlarged, symmetric, no tenderness/mass/nodules Back: symmetric, no curvature.  ROM normal. No CVA tenderness. Resp: clear to auscultation bilaterally Cardio: regular rate and rhythm, S1, S2 normal, no murmur, click, rub or gallop GI: soft, non-tender; bowel sounds normal; no masses,  no organomegaly Extremities: Left BKA. Right LE shows pedal edema  which is his baseline per patient. Pulses: Poorly palpable in lower ext. Skin: chronic skin changes in LE Lymph nodes: Cervical, supraclavicular, and axillary nodes normal. Neurologic: Alert and oriented X 3, normal strength and tone. No focal deficits   Laboratory Data: Results for orders placed during the hospital encounter of 08/04/12 (from the past 48 hour(s))   URINALYSIS, ROUTINE W REFLEX MICROSCOPIC     Status: Abnormal     Collection Time      08/04/12  3:32 PM       Result  Value  Range     Color, Urine  YELLOW   YELLOW     APPearance  TURBID (*)  CLEAR     Specific Gravity, Urine  1.015   1.005 - 1.030     pH  5.5   5.0 - 8.0     Glucose, UA  NEGATIVE   NEGATIVE mg/dL     Hgb urine dipstick  LARGE (*)  NEGATIVE     Bilirubin Urine  NEGATIVE   NEGATIVE     Ketones, ur  NEGATIVE   NEGATIVE mg/dL     Protein, ur  100 (*)  NEGATIVE mg/dL     Urobilinogen, UA  0.2   0.0 - 1.0 mg/dL     Nitrite  NEGATIVE   NEGATIVE     Leukocytes, UA  LARGE (*)  NEGATIVE   URINE MICROSCOPIC-ADD ON     Status: None     Collection Time      08/04/12  3:32 PM       Result  Value  Range     WBC, UA  TOO NUMEROUS TO COUNT   <3 WBC/hpf     RBC / HPF  TOO NUMEROUS TO COUNT   <3 RBC/hpf     Bacteria, UA  RARE   RARE     Urine-Other  FIELD OBSCURED BY WBC'S      POCT I-STAT, CHEM 8     Status: Abnormal     Collection Time      08/04/12  4:26 PM       Result  Value  Range     Sodium  141   135 - 145 mEq/L     Potassium  5.5 (*)  3.5 - 5.1 mEq/L     Chloride  119 (*)  96 - 112 mEq/L     BUN  65 (*)  6 - 23 mg/dL     Creatinine, Ser  2.60 (*)  0.50 - 1.35 mg/dL     Glucose, Bld  43 (*)  70 - 99 mg/dL     Calcium, Ion  1.29 (*)  1.12 - 1.23 mmol/L     TCO2  17   0 - 100 mmol/L     Hemoglobin  11.6 (*)  13.0 - 17.0 g/dL     HCT  34.0 (*)  39.0 - 52.0 %   BASIC METABOLIC PANEL     Status: Abnormal     Collection Time      08/04/12  4:41 PM       Result  Value  Range     Sodium  137   135 - 145  mEq/L  Potassium  5.4 (*)  3.5 - 5.1 mEq/L     Chloride  108   96 - 112 mEq/L     Comment:  DELTA CHECK NOTED        REPEATED TO VERIFY     CO2  14 (*)  19 - 32 mEq/L     Glucose, Bld  43 (*)  70 - 99 mg/dL     Comment:  CRITICAL RESULT CALLED TO, READ BACK BY AND VERIFIED WITH:        SUSAN COE,RN [id number] @ 1715 BY J SCOTTON     BUN  61 (*)  6 - 23 mg/dL     Creatinine, Ser  2.35 (*)  0.50 - 1.35 mg/dL     Calcium  9.4   8.4 - 10.5 mg/dL     GFR calc non Af Amer  30 (*)  >90 mL/min     GFR calc Af Amer  34 (*)  >90 mL/min     Comment:                The eGFR has been calculated        using the CKD EPI equation.        This calculation has not been        validated in all clinical        situations.        eGFR's persistently        <90 mL/min signify        possible Chronic Kidney Disease.   CBC WITH DIFFERENTIAL     Status: Abnormal     Collection Time      08/04/12  4:41 PM       Result  Value  Range     WBC  6.6   4.0 - 10.5 K/uL     RBC  3.55 (*)  4.22 - 5.81 MIL/uL     Hemoglobin  10.6 (*)  13.0 - 17.0 g/dL     HCT  33.7 (*)  39.0 - 52.0 %     MCV  94.9   78.0 - 100.0 fL     MCH  29.9   26.0 - 34.0 pg     MCHC  31.5   30.0 - 36.0 g/dL     RDW  18.0 (*)  11.5 - 15.5 %     Platelets  250   150 - 400 K/uL     Neutrophils Relative %  78 (*)  43 - 77 %     Neutro Abs  5.1   1.7 - 7.7 K/uL     Lymphocytes Relative  11 (*)  12 - 46 %     Lymphs Abs  0.7   0.7 - 4.0 K/uL     Monocytes Relative  8   3 - 12 %     Monocytes Absolute  0.5   0.1 - 1.0 K/uL     Eosinophils Relative  3   0 - 5 %     Eosinophils Absolute  0.2   0.0 - 0.7 K/uL     Basophils Relative  1   0 - 1 %     Basophils Absolute  0.0   0.0 - 0.1 K/uL      Radiology Reports: Ct Abdomen Pelvis Wo Contrast   08/04/2012   *RADIOLOGY REPORT*  Clinical Data: Kidney stones.  Abdominal pain.  Dysuria.  Frequent urination.  CT ABDOMEN AND PELVIS WITHOUT CONTRAST  Technique:  Multidetector CT imaging of the  abdomen and pelvis was performed following the standard protocol without intravenous contrast.  Comparison: 07/01/2012.  Findings: Lung Bases: Dependent atelectasis. Coronary artery atherosclerosis is present. If office based assessment of coronary risk factors has not been performed, it is now recommended.  Liver:  Unenhanced CT was performed per clinician order.  Lack of IV contrast limits sensitivity and specificity, especially for evaluation of abdominal/pelvic solid viscera.  Riedel's lobe of the liver, normal variant.  No gross mass lesion.  Spleen:  Normal.  Gallbladder:  Cholelithiasis.  No CT evidence of acute cholecystitis.  Common bile duct:  Normal.  Pancreas:  Atrophic.  No acute abnormality.  Adrenal glands:  Normal.  Kidneys:  Unchanged bilateral nonobstructing renal collecting system calculi.  The ureters appear within normal limits.  The distal ureters and UVJ are obscured by artifact from total hip arthroplasties.  Stomach:  Grossly normal.  Small bowel:  Grossly normal.  Colon:   Normal appendix.  Colonic diverticulosis.  No inflammatory changes are visualized.  Redundant sigmoid.  Pelvic Genitourinary:  No free fluid.  Urinary bladder decompressed.  Mural thickening of the bladder is likely secondary to under distention.  Bones:  Bilateral total hip arthroplasties.  The thoracolumbar spondylosis.  Chronic lower thoracic compression fractures with unchanged alignment compared to prior.  Exaggerated thoracolumbar kyphosis. Heterotopic bone around the hip arthroplasties. Chronic calcification in the left iliopsoas muscle with psoas muscular atrophy.  Vasculature: Atherosclerosis without acute abnormality.  Body Wall: The patient's abdomen is protuberant and portions extend off the field of scan.  Fat containing bilateral inguinal hernias.  IMPRESSION:  1.  Bilateral nonobstructing renal calculi.  No hydronephrosis or ureteral calculi identified. 2. Cholelithiasis. 3.  No acute abnormality.    Original Report Authenticated By: Dereck Ligas, M.D.     Problem List   Principal Problem:   ARF (acute renal failure) Active Problems:   DIABETES MELLITUS-TYPE II   OBESITY   HYPERTENSION   Hyperkalemia   Metabolic acidosis   Hypoglycemia associated with diabetes   Hypothyroidism   Assessment: This is a 57 year old, Caucasian male, with numerous medical problems, who presents with the frequent urination, and dysuria. He is found to have acute renal failure. His baseline renal function is close to normal per blood work from 2012. He's found to be hypoglycemic and has metabolic acidosis. He also has history of DVT and is on warfarin. PT/INR is pending.   Plan: #1 acute renal failure: Etiology is most likely a combination of the kidney stones, as well as dehydration. CT did not show hydronephrosis and there is no acute obstruction at this time. So, at this time renal failure will be treated with IV fluids. He is making urine. Urine output will be monitored closely.   #2 hyperkalemia, and metabolic acidosis: Anion gap is 15. These are likely due to renal failure. It should get corrected with IV fluids. We will repeat bmet tonight. At this time will hold off on giving anything for his potassium because it should get corrected with fluids. If it stays persistently elevated he will require definitive management.   #3 hypoglycemia in the setting of diabetes, type II: This is most likely due to medications and poor oral intake made worse by the renal failure. He took his glyburide last on Saturday. We will check HbA1c. We will put him on a D5 infusion for now till his blood sugar stabilizes. CBGs will be checked every 2 hours for now. After  having orange juice and crackers his blood sugar has improved to the 80s.   #4 history of hypertension: His blood pressure is somewhat borderline. He is mentating completely normally. We will hold his antihypertensive regimen. We will reinitiate as his  blood pressures stabilizes.   #5 nephrolithiasis/UTI: He did pass a stone, which he has with him. We'll send it for stone analysis. I discussed with Dr. Gaynelle Arabian. He requested [place] to call Dr. Dorina Hoyer in the morning. There is no need for any acute intervention at this time. Antibiotics. Will be initiated for possibility of UTI. Urine cultures are pending. Pyridium is contraindicated due to acute renal failure.   #6 history of hypothyroidism: Continue with levothyroxine.   #7 history of DVTs: Will check a stat PT and INR. Continue with warfarin per pharmacy.   DVT Prophylaxis: He is on warfarin. We'll check PT/INR Code Status: He is a full code Family Communication: Discussed with the patient and his wife   Disposition Plan: Will be admitted to telemetry for now.    Further management decisions will depend on results of further testing and patient's response to treatment.   [facility]   [facility] Pager [phone]   If 7PM-7AM, please contact night-coverage [email or web address] Password [facility]   08/04/2012, 6:32 PM

## 2012-08-05 NOTE — Care Management Note (Addendum)
    Page 1 of 1   08/07/2012     2:28:05 PM   CARE MANAGEMENT NOTE 08/07/2012  Patient:  ETIENNE, STEDMAN   Account Number:  [id number]  Date Initiated:  08/05/2012  Documentation initiated by:  Dessa Phi  Subjective/Objective Assessment:   ADMITTED W/ACUTE RENAL FAILURE.     Action/Plan:   FROM HOME.HAS PCP,PHARMACY.   Anticipated DC Date:  08/07/2012   Anticipated DC Plan:  [place]  CM consult      Choice offered to / List presented to:             Status of service:  Completed, signed off Medicare Important Message given?   (If response is "NO", the following Medicare IM given date fields will be blank) Date Medicare IM given:   Date Additional Medicare IM given:    Discharge Disposition:  HOME/SELF CARE  Per UR Regulation:  Reviewed for med. necessity/level of care/duration of stay  If discussed at [place] of Stay Meetings, dates discussed:    Comments:  08/07/12 Emitt Maglione MAHABIRRN,BSN NCM [phone] D/C [place]. 08/05/12 Wafa Martes RN,BSN NCM [phone] ELEVATED K, [place].

## 2012-08-05 NOTE — Progress Notes (Signed)
ANTICOAGULATION CONSULT NOTE - Follow Up  Pharmacy Consult for Coumadin Indication: Hx DVT  No Known Allergies  Patient Measurements: Height: 5\' 11"  (180.3 cm) Weight: 291 lb 0.1 oz (132 kg) IBW/kg (Calculated) : 75.3  Labs:  Recent Labs  08/04/12 1626 08/04/12 1641 08/04/12 1831 08/04/12 2200 08/05/12 0115  HGB 11.6* 10.6*  --   --  9.7*  HCT 34.0* 33.7*  --   --  31.1*  PLT  --  250  --   --  177  LABPROT  --   --  18.1*  --  17.0*  INR  --   --  1.55*  --  1.42  CREATININE 2.60* 2.35*  --  2.36* 2.33*    Estimated Creatinine Clearance: 50.2 ml/min (by C-G formula based on Cr of 2.33).   Assessment: 56 YO M on Coumadin PTA for Hx DVT.  Admitted 6/8 w/ frequent urination and pain. Pharmacy to manage Coumadin while inpt.  Dose PTA: 3mg , 3mg , 4.5mg  then repeat cycle. Last dose 6/7 = 4.5mg .   INR subtx on admission, continues to trend down - will continue to use larger doses  No bleeding reported in chart notes  Goal of Therapy:  INR 2-3   Plan:  1) Repeat warfarin 5mg  x1 tonight 2) Daily INR  Verdia Kuba, PharmD Pager: [phone] 08/05/2012 12:59 PM

## 2012-08-05 NOTE — Consult Note (Signed)
Urology Consult Referring MD: Curly Rim  CC: Bladder pain  HPI: 57 year old [name] who was admitted with renal insufficiency. Consultation is requested for evaluation of bladder pain. The patient has seen several urologists both here and in [place] in the last year or so. 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 He has been seen in our office 2-3 times over the past month. Hematuria CT was performed due to the patient having gross hematuria. This revealed nonobstructing bilateral renal calculi, slight fullness of the right renal pelvis was some pelvic thickening, but no evident obstruction of his urinary tract. I perform cystoscopy approximately 2 weeks ago. This revealed mildly erythematous urothelium of the bladder, but no evident urethral obstruction or lesions. The patient had a significant spasm during the cystoscopy. Cytologies were unremarkable. The patient has been using approximately 5 Vicodin a day, he says mainly to help him sleep at night. He does have urinary frequency and dysuria, as well as discomfort at the end of the stream. At times, he feels like his urine is blocked. He has been on anti-spasmodic in addition to his hydrocodone. The patient was recently admitted for creatinine of approximately 2.5. Urologic consultation is requested.  He has had I. lateral ureteroscopy in January by Dr. Emi Belfast in [facility]. Stents were left. The patient states that the stents exacerbated his urinary frequency, and did not improve his pain. He is also seen Dr. Anthoney Harada and a physician assistant at the moment urology in [facility].  He has had no recent fever. He has intermittent gross hematuria.  And 2010, on the CT scan he had significant adenopathy. A left pelvic node was biopsied. This revealed granulomatous changes, AFB studies were negative. It was felt that this could be secondary to the patient's orthopedic processes. This  adenopathy has continued.  PMH: Past Medical History  Diagnosis Date  . Sleep apnea     uses cpap  . Hypothyroidism   . Anemia   . Blood transfusion   . Chronic kidney disease     hx of kidney stones  . Hypertension   . Arthritis     osteoarthritis  . Pneumonia   . Diabetes mellitus     insulin dependent    PSH: Past Surgical History  Procedure Laterality Date  . Bilteral hip      replacement & revison  's   . Knee surgery      left knee    . Tonsillectomy    . Amputation  02/07/2011    Procedure: AMPUTATION BELOW KNEE;  Surgeon: Newt Minion, MD;  Location: [facility];  Service: Orthopedics;  Laterality: Left;  Left Below Knee Amputation    Allergies: No Known Allergies  Medications: Prescriptions prior to admission  Medication Sig Dispense Refill  . ALPHA LIPOIC ACID PO Take 1 tablet by mouth daily.        Marland Kitchen AMLODIPINE BESYLATE PO Take 5 mg by mouth daily.       . Ascorbic Acid (VITAMIN C PO) Take 1 tablet by mouth daily.        . ATORVASTATIN CALCIUM PO Take 40 mg by mouth daily.       . benazepril (LOTENSIN) 20 MG tablet Take 20 mg by mouth every morning.       . ferrous sulfate 325 (65 FE) MG tablet Take 325 mg by mouth 2 (two) times daily.        Marland Kitchen  FOLIC ACID PO Take 1 tablet by mouth daily.        Marland Kitchen GARLIC PO Take 1 tablet by mouth daily.        Marland Kitchen gemfibrozil (LOPID) 600 MG tablet Take 600 mg by mouth 2 (two) times daily before a meal.      . GLYBURIDE PO Take 2.5 mg by mouth 2 (two) times daily.       Marland Kitchen HYDROcodone-acetaminophen (NORCO/VICODIN) 5-325 MG per tablet Take 1 tablet by mouth every 6 (six) hours as needed for pain.      Marland Kitchen insulin aspart (NOVOLOG) 100 UNIT/ML injection Inject into the skin 2 (two) times daily. 100-150=3u, 151-200=4u, 201-250-7, 251-300=9, 301-350=11, over 351=14u      . insulin glargine (LANTUS) 100 UNIT/ML injection Inject 30 Units into the skin 2 (two) times daily.       Marland Kitchen levothyroxine (SYNTHROID, LEVOTHROID) 200 MCG tablet Take 200  mcg by mouth daily.        . Multiple Vitamins-Minerals (MULTIVITAMINS THER. W/MINERALS) TABS Take 1 tablet by mouth daily.        . nabumetone (RELAFEN) 750 MG tablet Take 750 mg by mouth 2 (two) times daily.       . Omega-3 Fatty Acids (FISH OIL PO) Take 3 tablets by mouth 2 (two) times daily.        . potassium chloride (K-DUR) 10 MEQ tablet Take 10 mEq by mouth 3 (three) times daily.      . Tamsulosin HCl (FLOMAX) 0.4 MG CAPS Take 0.4 mg by mouth daily.        Marland Kitchen VITAMIN E PO Take 1 tablet by mouth daily.        Marland Kitchen warfarin (COUMADIN) 3 MG tablet Take 3-4.5 mg by mouth daily. 3mg , 3mg , 4.5mg  then repeat      . CROMOLYN SODIUM PO Take 1 tablet by mouth daily.       . Cyanocobalamin (VITAMIN B 12 PO) Take 1 tablet by mouth daily.           Social History: History   Social History  . Marital Status: Married    Spouse Name: N/A    Number of Children: N/A  . Years of Education: N/A   Occupational History  . Not on file.   Social History Main Topics  . Smoking status: Never Smoker   . Smokeless tobacco: Never Used  . Alcohol Use: No  . Drug Use: No  . Sexually Active: Yes   Other Topics Concern  . Not on file   Social History Narrative  . No narrative on file    Family History: Family History  Problem Relation Age of Onset  . Diabetes Mother   . Colon cancer Father     Review of Systems: Positive: Urinary frequency, urgency, dysuria, incontinence, occasional hematuria Negative: .  A further 10 point review of systems was negative except what is listed in the HPI.  Physical Exam: @VITALS2 @ General: No acute distress.  Awake. Head:  Normocephalic.  Atraumatic. ENT:  EOMI.  Mucous membranes moist Neck:  Supple.  No lymphadenopathy. CV:  S1 present. S2 present. Regular rate. Pulmonary: Equal effort bilaterally.  Clear to auscultation bilaterally. Abdomen: Soft.  Non tender to palpation. Skin:  Normal turgor.  No visible rash. Extremity: No gross deformity of  bilateral upper extremities.  S/P left BKA Neurologic: Alert. Appropriate mood.  Penis:  Buried No lesions. Urethra: No Foley catheter in place.  Orthotopic meatus. Scrotum: No lesions.  No  ecchymosis.  No erythema. Testicles: Descended bilaterally.  No masses bilaterally. Epididymis: Palpable bilaterally.  Non Tender to palpation.  Studies:  Recent Labs     08/04/12  1641  08/05/12  0115  HGB  10.6*  9.7*  WBC  6.6  5.3  PLT  250  177    Recent Labs     08/05/12  0115  08/05/12  1307  NA  135  136  K  5.8*  5.1  CL  107  109  CO2  15*  17*  BUN  61*  56*  CREATININE  2.33*  2.25*  CALCIUM  9.0  8.8  GFRNONAA  30*  31*  GFRAA  35*  36*     Recent Labs     08/04/12  1831  08/05/12  0115  INR  1.55*  1.42     No components found with this basename: ABG,     Assessment:  1. Bladder pain, frequency. This is fairly long-standing to the patient. So far, neoplasia/urothelial carcinoma has been ruled out with cystoscopy and negative cytology. I do not think that this is secondary to urolithiasis, despite his recently having passed small stones. This may be interstitial cystitis.  2. History of uric acid urolithiasis. He has nonobstructing calculi in both kidneys.  Plan: 1. At this point, I do not think any acute urologic intervention is necessary. He seems to improving somewhat with regards to his creatinine.  2. I would consider the diagnosis of interstitial cystitis in this man, as neoplastic and obstructive causes of his pain have been ruled out. I will await urine cultures, but he has not improved with outpatient antibiotic management so far. Currently, I agree with his Rocephin. I will consider, on an outpatient basis, cystoscopy and hydrodistention. Presently, I think limiting potassium, caffeine and spicy foods might be worthwhile from a dietary standpoint  3. I will continue to follow him. I will call in to check on him on Tuesday, the 10th but will be out of  town. If there is urgent need for urologic followup, call my office at [phone]. Otherwise, I will be back to see him on the 11th if he is still in the hospital. If he goes home, I will call to set up outpatient cystoscopy.    Pager:[phone]

## 2012-08-06 DIAGNOSIS — E872 Acidosis: Secondary | ICD-10-CM

## 2012-08-06 LAB — CBC
HCT: 29.8 % — ABNORMAL LOW (ref 39.0–52.0)
Hemoglobin: 9.5 g/dL — ABNORMAL LOW (ref 13.0–17.0)
RBC: 3.14 MIL/uL — ABNORMAL LOW (ref 4.22–5.81)
RDW: 17.8 % — ABNORMAL HIGH (ref 11.5–15.5)
WBC: 6.3 10*3/uL (ref 4.0–10.5)

## 2012-08-06 LAB — URINE CULTURE

## 2012-08-06 LAB — BASIC METABOLIC PANEL
BUN: 51 mg/dL — ABNORMAL HIGH (ref 6–23)
Calcium: 8.7 mg/dL (ref 8.4–10.5)
Creatinine, Ser: 2.07 mg/dL — ABNORMAL HIGH (ref 0.50–1.35)
GFR calc Af Amer: 40 mL/min — ABNORMAL LOW (ref >90)
GFR calc non Af Amer: 35 mL/min — ABNORMAL LOW (ref >90)

## 2012-08-06 LAB — GLUCOSE, CAPILLARY: Glucose-Capillary: 260 mg/dL — ABNORMAL HIGH (ref 70–99)

## 2012-08-06 LAB — PROTIME-INR: INR: 2.06 — ABNORMAL HIGH (ref 0.00–1.49)

## 2012-08-06 MED ORDER — INSULIN ASPART 100 UNIT/ML ~~LOC~~ SOLN
0.0000 [IU] | Freq: Three times a day (TID) | SUBCUTANEOUS | Status: DC
  Administered 2012-08-06 (×2): 5 [IU] via SUBCUTANEOUS
  Administered 2012-08-06: 8 [IU] via SUBCUTANEOUS
  Administered 2012-08-07 (×2): 3 [IU] via SUBCUTANEOUS

## 2012-08-06 MED ORDER — INSULIN ASPART 100 UNIT/ML ~~LOC~~ SOLN
0.0000 [IU] | Freq: Every day | SUBCUTANEOUS | Status: DC
  Administered 2012-08-06: 3 [IU] via SUBCUTANEOUS

## 2012-08-06 MED ORDER — WARFARIN SODIUM 2.5 MG PO TABS
2.5000 mg | ORAL_TABLET | Freq: Once | ORAL | Status: AC
  Administered 2012-08-06: 2.5 mg via ORAL
  Filled 2012-08-06: qty 1

## 2012-08-06 NOTE — Progress Notes (Signed)
ANTICOAGULATION CONSULT NOTE - Follow Up  Pharmacy Consult for Coumadin Indication: Hx DVT  No Known Allergies  Patient Measurements: Height: 5\' 11"  (180.3 cm) Weight: 291 lb 0.1 oz (132 kg) IBW/kg (Calculated) : 75.3  Labs:  Recent Labs  08/04/12 1641 08/04/12 1831  08/05/12 0115 08/05/12 1307 08/06/12 0445  HGB 10.6*  --   --  9.7*  --  9.5*  HCT 33.7*  --   --  31.1*  --  29.8*  PLT 250  --   --  177  --  195  LABPROT  --  18.1*  --  17.0*  --  22.4*  INR  --  1.55*  --  1.42  --  2.06*  CREATININE 2.35*  --   < > 2.33* 2.25* 2.07*  < > = values in this interval not displayed.  Estimated Creatinine Clearance: 56.5 ml/min (by C-G formula based on Cr of 2.07).   Assessment: 57 YO M on Coumadin PTA for Hx DVT.  Admitted 6/8 w/ frequent urination and pain. Pharmacy to manage Coumadin while inpt.  Dose PTA: 3mg , 3mg , 4.5mg  then repeat cycle. Last dose 6/7 = 4.5mg .   INR subtx on admission, patient was given 2 larger-than-normal dose of warfarin (5mg  x2)  INR this am now back in goal range. Will give lower dose tonight to slow INR trend (1.42 --> 2.06)  No bleeding reported in chart notes  Goal of Therapy:  INR 2-3   Plan:  1) Repeat warfarin 2.5mg  x1 tonight 2) Daily INR  Verdia Kuba, PharmD Pager: [phone] 08/06/2012 8:44 AM

## 2012-08-06 NOTE — Progress Notes (Signed)
Inpatient Diabetes Program Recommendations  AACE/ADA: New Consensus Statement on Inpatient Glycemic Control (2013)  Target Ranges:  Prepandial:   less than 140 mg/dL      Peak postprandial:   less than 180 mg/dL (1-2 hours)      Critically ill patients:  140 - 180 mg/dL   Reason for Visit: Hyperglycemia  Results for [name], [name] (MRN [id number]) as of 08/06/2012 12:35  Ref. Range 08/05/2012 11:40 08/05/2012 16:34 08/05/2012 20:38 08/06/2012 07:58 08/06/2012 12:10  Glucose-Capillary Latest Range: 70-99 mg/dL 236 (H) 277 (H) 237 (H) 229 (H) 270 (H)   Restart small amt of home basal insulin.  Inpatient Diabetes Program Recommendations Insulin - Basal: Lantus 20 units QHS and titrate until FBS<180 mg/dL Oral Agents: D/C glyburide home medication  Note: Will follow.  Thank you. Lorenda Peck, RD, LDN, CDE Inpatient Diabetes Coordinator [phone]

## 2012-08-06 NOTE — Progress Notes (Addendum)
[facility] PROGRESS NOTE  [name] [id number] DOB: 05-Dec-1955 DOA: 08/04/2012  PCP: [name], MD  Brief HPI: [name] is a 57 y.o. male with a past medical history of, nephrolithiasis, diabetes, peripheral vascular disease, status post left below-knee amputation, hypertension, DVT, who was in his usual state of health till a few days ago, when he started having frequent urination. He had painful urination. He saw blood in the urine. The pain was 8/10 in intensity. It has been pretty constant. Denied any fever or chills. No nausea, vomiting, or diarrhea. Denied any cough. He's had poor appetite and poor oral intake the last few days because of his symptoms. He mentioned that this has been ongoing for the last 6 months. He had ureteral stents placed in February and they were removed in March. However, he has had constant problems with the frequent urination. He actually passed a stone a few days ago, which he has by his bedside.   Past medical history:  Past Medical History  Diagnosis Date  . Sleep apnea     uses cpap  . Hypothyroidism   . Anemia   . Blood transfusion   . Chronic kidney disease     hx of kidney stones  . Hypertension   . Arthritis     osteoarthritis  . Pneumonia   . Diabetes mellitus     insulin dependent    Consultants: Urology  Procedures: None  Antibiotics: Ceftriaxone 6/8  Subjective: Patient says pain is worse today. Also having back pain which he attributes to kidney stones. He states he passed a small blood clot last night. Denies any falls recently.  Objective: Vital Signs  Filed Vitals:   08/05/12 2041 08/06/12 0105 08/06/12 0516 08/06/12 0545  BP: 107/72  100/62   Pulse: 102 98 99   Temp: 98.3 F (36.8 C)  97.7 F (36.5 C)   TempSrc: Oral  Oral   Resp: 18  16 14   Height:      Weight:      SpO2: 97%  98%     Intake/Output Summary (Last 24 hours) at 08/06/12 0732 Last data filed at 08/06/12 0440  Gross per 24  hour  Intake   4160 ml  Output   3125 ml  Net   1035 ml   Filed Weights   08/04/12 2042  Weight: 132 kg (291 lb 0.1 oz)    General appearance: alert, cooperative, appears stated age, no distress and morbidly obese  Resp: clear to auscultation bilaterally  Cardio: regular rate and rhythm, S1, S2 normal, no murmur, click, rub or gallop  GI: soft, non-tender; bowel sounds normal; no masses, no organomegaly  Extremities: Left BKA. Right LE shows pedal edema which is his baseline per patient.  Skin: chronic skin changes in LE  Neurologic: Alert and oriented X 3, normal strength and tone. No focal deficits  Back: No abnormality noted.  Lab Results:  Basic Metabolic Panel:  Recent Labs Lab 08/04/12 1641 08/04/12 2200 08/05/12 0115 08/05/12 1307 08/06/12 0445  NA 137 136 135 136 134*  K 5.4* 5.7* 5.8* 5.1 4.4  CL 108 108 107 109 107  CO2 14* 15* 15* 17* 16*  GLUCOSE 43* 159* 159* 287* 263*  BUN 61* 59* 61* 56* 51*  CREATININE 2.35* 2.36* 2.33* 2.25* 2.07*  CALCIUM 9.4 8.9 9.0 8.8 8.7   Liver Function Tests:  Recent Labs Lab 08/05/12 0115  AST 11  ALT 9  ALKPHOS 56  BILITOT 0.2*  PROT 7.1  ALBUMIN 3.0*   CBC:  Recent Labs Lab 08/04/12 1626 08/04/12 1641 08/05/12 0115 08/06/12 0445  WBC  --  6.6 5.3 6.3  NEUTROABS  --  5.1  --   --   HGB 11.6* 10.6* 9.7* 9.5*  HCT 34.0* 33.7* 31.1* 29.8*  MCV  --  94.9 95.7 94.9  PLT  --  250 177 195   CBG:  Recent Labs Lab 08/05/12 0629 08/05/12 0735 08/05/12 1140 08/05/12 1634 08/05/12 2038  GLUCAP 175* 168* 236* 277* 237*    Recent Results (from the past 240 hour(s))  URINE CULTURE     Status: None   Collection Time    08/04/12  3:32 PM      Result Value Range Status   Specimen Description URINE, CLEAN CATCH   Final   Special Requests NONE   Final   Culture  Setup Time 08/04/2012 20:12   Final   Colony Count 60,000 COLONIES/ML   Final   Culture GRAM NEGATIVE RODS   Final   Report Status PENDING    Incomplete  MRSA PCR SCREENING     Status: Abnormal   Collection Time    08/04/12 10:27 PM      Result Value Range Status   MRSA by PCR POSITIVE (*) NEGATIVE Final   Comment:            The GeneXpert MRSA Assay (FDA     approved for NASAL specimens     only), is one component of a     comprehensive MRSA colonization     surveillance program. It is not     intended to diagnose MRSA     infection nor to guide or     monitor treatment for     MRSA infections.     RESULT CALLED TO, READ BACK BY AND VERIFIED WITH:     CLAYTON,M RN AT 0028 ON [id number] BY MCREYNOLDS,B      Studies/Results: Ct Abdomen Pelvis Wo Contrast  08/04/2012   *RADIOLOGY REPORT*  Clinical Data: Kidney stones.  Abdominal pain.  Dysuria.  Frequent urination.  CT ABDOMEN AND PELVIS WITHOUT CONTRAST  Technique:  Multidetector CT imaging of the abdomen and pelvis was performed following the standard protocol without intravenous contrast.  Comparison: 07/01/2012.  Findings: Lung Bases: Dependent atelectasis. Coronary artery atherosclerosis is present. If office based assessment of coronary risk factors has not been performed, it is now recommended.  Liver:  Unenhanced CT was performed per clinician order.  Lack of IV contrast limits sensitivity and specificity, especially for evaluation of abdominal/pelvic solid viscera.  Riedel's lobe of the liver, normal variant.  No gross mass lesion.  Spleen:  Normal.  Gallbladder:  Cholelithiasis.  No CT evidence of acute cholecystitis.  Common bile duct:  Normal.  Pancreas:  Atrophic.  No acute abnormality.  Adrenal glands:  Normal.  Kidneys:  Unchanged bilateral nonobstructing renal collecting system calculi.  The ureters appear within normal limits.  The distal ureters and UVJ are obscured by artifact from total hip arthroplasties.  Stomach:  Grossly normal.  Small bowel:  Grossly normal.  Colon:   Normal appendix.  Colonic diverticulosis.  No inflammatory changes are visualized.  Redundant  sigmoid.  Pelvic Genitourinary:  No free fluid.  Urinary bladder decompressed.  Mural thickening of the bladder is likely secondary to under distention.  Bones:  Bilateral total hip arthroplasties.  The thoracolumbar spondylosis.  Chronic lower thoracic compression fractures with unchanged alignment compared to prior.  Exaggerated thoracolumbar kyphosis. Heterotopic bone around the hip arthroplasties. Chronic calcification in the left iliopsoas muscle with psoas muscular atrophy.  Vasculature: Atherosclerosis without acute abnormality.  Body Wall: The patient's abdomen is protuberant and portions extend off the field of scan.  Fat containing bilateral inguinal hernias.  IMPRESSION:  1.  Bilateral nonobstructing renal calculi.  No hydronephrosis or ureteral calculi identified. 2. Cholelithiasis. 3.  No acute abnormality.   Original Report Authenticated By: Dereck Ligas, M.D.    Medications:  Scheduled: . cefTRIAXone (ROCEPHIN)  IV  1 g Intravenous Q24H  . Chlorhexidine Gluconate Cloth  6 each Topical Q0600  . insulin aspart  0-5 Units Subcutaneous QHS  . insulin aspart  0-9 Units Subcutaneous TID WC  . levothyroxine  200 mcg Oral QAC breakfast  . mupirocin ointment  1 application Nasal BID  . rosuvastatin  20 mg Oral QHS  . sodium chloride  3 mL Intravenous Q12H  . Warfarin - Pharmacist Dosing Inpatient   Does not apply q1800   Continuous: . sodium chloride 1,000 mL (08/06/12 0259)   [id number], acetaminophen, albuterol, HYDROcodone-acetaminophen, HYDROmorphone (DILAUDID) injection, ondansetron (ZOFRAN) IV, ondansetron  Assessment/Plan:  Principal Problem:   ARF (acute renal failure) Active Problems:   DIABETES MELLITUS-TYPE II   OBESITY   HYPERTENSION   Hyperkalemia   Metabolic acidosis   Hypoglycemia associated with diabetes   Hypothyroidism    Acute renal failure Etiology is most likely a combination of the kidney stones, as well as dehydration. CT did not show  hydronephrosis and there is no acute obstruction at this time. Creatinine is slowly improving. Last labs from 2012 showed close to normal creatinine. Some element of CKD cannot be entirely ruled out but appears to be acute at this time. Continue with IVF at lower rate. Monitor labs daily. Monitor UO. He has good UO.  Hyperkalemia, and metabolic acidosis Potassium is improved after Kayexalate yesterday. Acidosis is stable. Monitor for now.   Hypoglycemia in the setting of diabetes, type II Hypoglycemia was most likely due to medications and poor oral intake made worse by the renal failure. He took his glyburide last on Saturday. HbA1c is 4.9. CBG is now elevated. He was started on sensitive sliding scale yesterday. Will change to moderate. Consider reinitiating basal insulin in AM.  History of hypertension His blood pressure remains somewhat borderline low. Though he remains asymptomatic. Continue to hold his antihypertensive regimen.   Nephrolithiasis/UTI He did pass a stone, which was sent for stone analysis. Appreciate Urology input. May need OP urological work up. Will defer to urology. Continue ceftriaxone. Urine culture reveals 6000 colonies of GNR, final ID pending. Pyridium is contraindicated due to acute renal failure. Patient states back pain is from stones. Continue to monitor. May need further work if gets worse.   History of hypothyroidism Continue with levothyroxine.   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 INR is therapeutic now. Pharmacy managing warfarin. Don't anticipate any procedures currently.   NSVT Noted last night. Electrolytes are norma. Patient was asymptomatic. EF from 2010 was normal. Continue to monitor.  Normocytic Anemia Stable. No bleeds. Occasional blood in urine noted but not significant. Continue to monitor as patient is on anti-coagulation.  DVT Prophylaxis: He is on warfarin. Code Status: He is a full code  Family Communication: Discussed with the  patient  Disposition Plan: Not ready for discharge. Will likely return home when improved. Anticipate DC in 1-2 days.    LOS: 2 days   [facility] Pager  [phone] 08/06/2012, 7:32 AM  If 8PM-8AM, please contact night-coverage at [email or web address], password [facility]

## 2012-08-06 NOTE — Progress Notes (Signed)
Patient had 7 beats of VTach. He is sitting up on side of bed using urinal-completely asymptomatic. Telemetry strip placed in shadow chart in [place]. Will continue to monitor. [name]

## 2012-08-06 NOTE — Plan of Care (Signed)
Problem: Phase I Progression Outcomes Goal: Pain controlled with appropriate interventions Outcome: Progressing C/o pain in lower back and in urethra. Prn dilaudid and vicodin given.

## 2012-08-07 DIAGNOSIS — E785 Hyperlipidemia, unspecified: Secondary | ICD-10-CM

## 2012-08-07 DIAGNOSIS — I82409 Acute embolism and thrombosis of unspecified deep veins of unspecified lower extremity: Secondary | ICD-10-CM

## 2012-08-07 DIAGNOSIS — E162 Hypoglycemia, unspecified: Secondary | ICD-10-CM

## 2012-08-07 LAB — BASIC METABOLIC PANEL
CO2: 17 mEq/L — ABNORMAL LOW (ref 19–32)
Calcium: 9.2 mg/dL (ref 8.4–10.5)
Chloride: 106 mEq/L (ref 96–112)
Creatinine, Ser: 1.81 mg/dL — ABNORMAL HIGH (ref 0.50–1.35)
Glucose, Bld: 205 mg/dL — ABNORMAL HIGH (ref 70–99)

## 2012-08-07 LAB — GLUCOSE, CAPILLARY
Glucose-Capillary: 196 mg/dL — ABNORMAL HIGH (ref 70–99)
Glucose-Capillary: 183 mg/dL — ABNORMAL HIGH (ref 70–99)
Glucose-Capillary: 204 mg/dL — ABNORMAL HIGH (ref 70–99)

## 2012-08-07 LAB — CBC
Hemoglobin: 9.6 g/dL — ABNORMAL LOW (ref 13.0–17.0)
MCH: 30.8 pg (ref 26.0–34.0)
MCV: 92.9 fL (ref 78.0–100.0)
RBC: 3.12 MIL/uL — ABNORMAL LOW (ref 4.22–5.81)
WBC: 5.4 10*3/uL (ref 4.0–10.5)

## 2012-08-07 LAB — STONE ANALYSIS: Stone Weight KSTONE: 0.013 g

## 2012-08-07 MED ORDER — CYCLOBENZAPRINE HCL 5 MG PO TABS: 5.0000 mg | ORAL_TABLET | Freq: Every day | ORAL | Status: DC

## 2012-08-07 MED ORDER — HYDROCODONE-ACETAMINOPHEN 5-325 MG PO TABS: 1.0000 | ORAL_TABLET | Freq: Four times a day (QID) | ORAL | Status: DC | PRN

## 2012-08-07 MED ORDER — INSULIN GLARGINE 100 UNIT/ML ~~LOC~~ SOLN: 20.0000 [IU] | Freq: Two times a day (BID) | SUBCUTANEOUS | Status: DC

## 2012-08-07 MED ORDER — CEFDINIR 300 MG PO CAPS: 300.0000 mg | ORAL_CAPSULE | Freq: Two times a day (BID) | ORAL | Status: DC

## 2012-08-07 MED ORDER — OXYCODONE HCL 5 MG PO TABS: 5.0000 mg | ORAL_TABLET | ORAL | Status: DC | PRN

## 2012-08-07 MED ORDER — INSULIN GLARGINE 100 UNIT/ML ~~LOC~~ SOLN
20.0000 [IU] | Freq: Every day | SUBCUTANEOUS | Status: DC
  Administered 2012-08-07: 20 [IU] via SUBCUTANEOUS
  Filled 2012-08-07: qty 0.2

## 2012-08-07 NOTE — Progress Notes (Signed)
  Subjective: Patient reports continued back/bladder pain. No worse, though  Objective: Vital signs in last 24 hours: Temp:  [98.1 F (36.7 C)-98.2 F (36.8 C)] 98.1 F (36.7 C) (06/11 0646) Pulse Rate:  [94-96] 94 (06/11 0646) Resp:  [20] 20 (06/11 0646) BP: (129-140)/(75-79) 140/79 mmHg (06/11 0646) SpO2:  [99 %-100 %] 99 % (06/11 0646)  Intake/Output from previous day: 06/10 0701 - 06/11 0700 In: 2740 [P.O.:1440; I.V.:1300] Out: 3205 [Urine:3205] Intake/Output this shift:    Physical Exam:  Constitutional: Vital signs reviewed. WD WN in NAD   Eyes: PERRL, No scleral icterus.    Urine is dark but clear  Lab Results:  Recent Labs  08/05/12 0115 08/06/12 0445 08/07/12 0503  HGB 9.7* 9.5* 9.6*  HCT 31.1* 29.8* 29.0*   BMET  Recent Labs  08/06/12 0445 08/07/12 0503  NA 134* 134*  K 4.4 4.1  CL 107 106  CO2 16* 17*  GLUCOSE 263* 205*  BUN 51* 41*  CREATININE 2.07* 1.81*  CALCIUM 8.7 9.2    Recent Labs  08/05/12 0115 08/06/12 0445 08/07/12 0503  INR 1.42 2.06* 1.95*   No results found for this basename: LABURIN,  in the last 72 hours Results for orders placed during the hospital encounter of 08/04/12  URINE CULTURE     Status: None   Collection Time    08/04/12  3:32 PM      Result Value Range Status   Specimen Description URINE, CLEAN CATCH   Final   Special Requests NONE   Final   Culture  Setup Time 08/04/2012 20:12   Final   Colony Count 60,000 COLONIES/ML   Final   Culture KLEBSIELLA PNEUMONIAE   Final   Report Status 08/06/2012 FINAL   Final   Organism ID, Bacteria KLEBSIELLA PNEUMONIAE   Final  MRSA PCR SCREENING     Status: Abnormal   Collection Time    08/04/12 10:27 PM      Result Value Range Status   MRSA by PCR POSITIVE (*) NEGATIVE Final   Comment:            The GeneXpert MRSA Assay (FDA     approved for NASAL specimens     only), is one component of a     comprehensive MRSA colonization     surveillance program. It is not      intended to diagnose MRSA     infection nor to guide or     monitor treatment for     MRSA infections.     RESULT CALLED TO, READ BACK BY AND VERIFIED WITH:     CLAYTON,M RN AT 0028 ON [id number] BY MCREYNOLDS,B    Studies/Results: No results found.  Assessment/Plan:   1. History of urolithiasis without evidence of any stones passing at the present time    2. UTI, Klebsiella, basically pansensitive.    3. Bladder/back pain. He may have interstitial cystitis, but his bladder pain may be due to the current UTI. Certainly, with the patient's body habitus musculoskeletal etiology of his back pain as possible.    I would recommend sending him home with an appropriate antibiotic-he has been on cephalosporins, continuing this would be fine. My office will call to set up anesthetic cystoscopy, HOD/bladder biopsy. I will send him home on at least 10 days of the antibiotic.   LOS: 3 days   Franchot Gallo M 08/07/2012, 8:16 AM

## 2012-08-07 NOTE — Discharge Summary (Signed)
Physician Discharge Summary  [name] [id number] DOB: [birth year] DOA: 08/04/2012  PCP: [name], MD  Admit date: 08/04/2012 Discharge date: 08/07/2012  Time spent: > 35  minutes  Recommendations for Outpatient Follow-up:  1. Please be sure to follow up on patient's blood sugars and adjust his hypoglycemic regimen as needed 2. Also be sure to follow up on suspected Right paraspinal muscle spasm 3. Patient will need to follow up with urologist as outpatient. 4. Monitor blood pressures and decide whether or not patient will require further adjustments 5. Monitor INR and make recommendations for coumadin pending values.  Discharge Diagnoses:  Principal Problem:   ARF (acute renal failure) Active Problems:   DIABETES MELLITUS-TYPE II   OBESITY   HYPERTENSION   Hyperkalemia   Metabolic acidosis   Hypoglycemia associated with diabetes   Hypothyroidism   Discharge Condition: stable  Diet recommendation: diabetic diet  Filed Weights   08/04/12 2042  Weight: 132 kg (291 lb 0.1 oz)    History of present illness:  57 y/o with h/o nephrolithiasis, DM, PVD, s/p left below-knee amputation, htn, dvt.  Presented to the hospital complaining of Frequent urination and dysuria.  Hospital Course:   Acute renal failure  Etiology is most likely a combination of the kidney stones, as well as dehydration. CT did not show hydronephrosis and there is no acute obstruction at this time. Creatinine is slowly improving. - At this point suspect creatinine will continue to improved.  Will hold glyburide.   - Last CT of abdomen/pelvis showed no obstructing renal calculi, no ureteral calculi or hydronephrosis.  Hyperkalemia, and metabolic acidosis  Potassium is improved after Kayexalate administration. - K is normal on day of discharge.  Hypoglycemia in the setting of diabetes, type II  Hypoglycemia was most likely due to medications and poor oral intake made worse by the renal failure.   - His blood sugars have been in 200's on day of discharge.  Will place him on his Lantus 20 units SQ BID which is lower than his pre admission dose (30 units SQ BID).   - Recommended that he continue to log his blood sugars and follow up with his primary care physician for further recommendations.  History of hypertension  His blood pressure remains somewhat borderline low. Though he remains asymptomatic. Continue to hold his antihypertensive regimen on day of discharge and have his pcp decide whether or not to continue. - His last recorded 140/79-129/75  Nephrolithiasis/UTI  He did pass a stone, which was sent for stone analysis. Appreciate Urology input. Will need OP urological work up. Patient to follow up with urologist as outpatient.  We will continue cephalosporin for 7 more days to complete a 10 day course.   History of hypothyroidism  Continue with levothyroxine.   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 INR is therapeutic now. Patient to continue home regimen and follow up with his pcp for further recommendations.  NSVT  Noted last night. Electrolytes are norma. Patient was asymptomatic reportedly. EF from 2010 was normal. Continue to monitor. No red flags reported to me and on day of discharge patient was sinus rhythm on telemetry monitoring.   Procedures:  None  Consultations:  Urologist: [name]  Discharge Exam: Filed Vitals:   08/06/12 0516 08/06/12 0545 08/06/12 2033 08/07/12 0646  BP: 100/62  129/75 140/79  Pulse: 99  96 94  Temp: 97.7 F (36.5 C)  98.2 F (36.8 C) 98.1 F (36.7 C)  TempSrc: Oral  Oral  Oral  Resp: 16 14 20 20   Height:      Weight:      SpO2: 98%  100% 99%    General: Pt in NAD, Alert and Awake Cardiovascular: RRR, no MRG Respiratory: CTA BL, no wheezes  Discharge Instructions  Discharge Orders   Future Orders Complete By Expires     Call MD for:  severe uncontrolled pain  As directed     Call MD for:  temperature >100.4   As directed     Diet - low sodium heart healthy  As directed     Discharge instructions  As directed     Comments:      Please be sure to follow up with your urologist for further evaluation and recommendations.  Also follow up with your pcp for further monitoring of your blood sugars and your lower right paraspinal muscle spasm    Increase activity slowly  As directed         Medication List    STOP taking these medications       AMLODIPINE BESYLATE PO     benazepril 20 MG tablet  Commonly known as:  LOTENSIN     CROMOLYN SODIUM PO     GLYBURIDE PO     nabumetone 750 MG tablet  Commonly known as:  RELAFEN     potassium chloride 10 MEQ tablet  Commonly known as:  K-DUR     VITAMIN B 12 PO      TAKE these medications       ALPHA LIPOIC ACID PO  Take 1 tablet by mouth daily.     ATORVASTATIN CALCIUM PO  Take 40 mg by mouth daily.     cefdinir 300 MG capsule  Commonly known as:  OMNICEF  Take 1 capsule (300 mg total) by mouth 2 (two) times daily.     cyclobenzaprine 5 MG tablet  Commonly known as:  FLEXERIL  Take 1 tablet (5 mg total) by mouth at bedtime.     ferrous sulfate 325 (65 FE) MG tablet  Take 325 mg by mouth 2 (two) times daily.     FISH OIL PO  Take 3 tablets by mouth 2 (two) times daily.     FOLIC ACID PO  Take 1 tablet by mouth daily.     GARLIC PO  Take 1 tablet by mouth daily.     gemfibrozil 600 MG tablet  Commonly known as:  LOPID  Take 600 mg by mouth 2 (two) times daily before a meal.     HYDROcodone-acetaminophen 5-325 MG per tablet  Commonly known as:  NORCO/VICODIN  Take 1 tablet by mouth every 6 (six) hours as needed for pain. Marland Kitchen     insulin aspart 100 UNIT/ML injection  Commonly known as:  novoLOG  Inject into the skin 2 (two) times daily. 100-150=3u, 151-200=4u, 201-250-7, 251-300=9, 301-350=11, over 351=14u     insulin glargine 100 UNIT/ML injection  Commonly known as:  LANTUS  Inject 0.2 mLs (20 Units total) into the  skin 2 (two) times daily.     levothyroxine 200 MCG tablet  Commonly known as:  SYNTHROID, LEVOTHROID  Take 200 mcg by mouth daily.     multivitamins ther. w/minerals Tabs  Take 1 tablet by mouth daily.     oxyCODONE 5 MG immediate release tablet  Commonly known as:  Oxy IR/ROXICODONE  Take 1 tablet (5 mg total) by mouth every 4 (four) hours as needed for pain.  tamsulosin 0.4 MG Caps  Commonly known as:  FLOMAX  Take 0.4 mg by mouth daily.     VITAMIN C PO  Take 1 tablet by mouth daily.     VITAMIN E PO  Take 1 tablet by mouth daily.     warfarin 3 MG tablet  Commonly known as:  COUMADIN  Take 3-4.5 mg by mouth daily. 3mg , 3mg , 4.5mg  then repeat       No Known Allergies     Follow-up Information   Follow up with DAHLSTEDT, Lillette Boxer, MD. (We will call to set up an appointment)    Contact information:   [place] [address] [phone]        The results of significant diagnostics from this hospitalization (including imaging, microbiology, ancillary and laboratory) are listed below for reference.    Significant Diagnostic Studies: Ct Abdomen Pelvis Wo Contrast  08/04/2012   *RADIOLOGY REPORT*  Clinical Data: Kidney stones.  Abdominal pain.  Dysuria.  Frequent urination.  CT ABDOMEN AND PELVIS WITHOUT CONTRAST  Technique:  Multidetector CT imaging of the abdomen and pelvis was performed following the standard protocol without intravenous contrast.  Comparison: 07/01/2012.  Findings: Lung Bases: Dependent atelectasis. Coronary artery atherosclerosis is present. If office based assessment of coronary risk factors has not been performed, it is now recommended.  Liver:  Unenhanced CT was performed per clinician order.  Lack of IV contrast limits sensitivity and specificity, especially for evaluation of abdominal/pelvic solid viscera.  Riedel's lobe of the liver, normal variant.  No gross mass lesion.  Spleen:  Normal.  Gallbladder:   Cholelithiasis.  No CT evidence of acute cholecystitis.  Common bile duct:  Normal.  Pancreas:  Atrophic.  No acute abnormality.  Adrenal glands:  Normal.  Kidneys:  Unchanged bilateral nonobstructing renal collecting system calculi.  The ureters appear within normal limits.  The distal ureters and UVJ are obscured by artifact from total hip arthroplasties.  Stomach:  Grossly normal.  Small bowel:  Grossly normal.  Colon:   Normal appendix.  Colonic diverticulosis.  No inflammatory changes are visualized.  Redundant sigmoid.  Pelvic Genitourinary:  No free fluid.  Urinary bladder decompressed.  Mural thickening of the bladder is likely secondary to under distention.  Bones:  Bilateral total hip arthroplasties.  The thoracolumbar spondylosis.  Chronic lower thoracic compression fractures with unchanged alignment compared to prior.  Exaggerated thoracolumbar kyphosis. Heterotopic bone around the hip arthroplasties. Chronic calcification in the left iliopsoas muscle with psoas muscular atrophy.  Vasculature: Atherosclerosis without acute abnormality.  Body Wall: The patient's abdomen is protuberant and portions extend off the field of scan.  Fat containing bilateral inguinal hernias.  IMPRESSION:  1.  Bilateral nonobstructing renal calculi.  No hydronephrosis or ureteral calculi identified. 2. Cholelithiasis. 3.  No acute abnormality.   Original Report Authenticated By: Dereck Ligas, M.D.    Microbiology: Recent Results (from the past 240 hour(s))  URINE CULTURE     Status: None   Collection Time    08/04/12  3:32 PM      Result Value Range Status   Specimen Description URINE, CLEAN CATCH   Final   Special Requests NONE   Final   Culture  Setup Time 08/04/2012 20:12   Final   Colony Count 60,000 COLONIES/ML   Final   Culture KLEBSIELLA PNEUMONIAE   Final   Report Status 08/06/2012 FINAL   Final   Organism ID, Bacteria KLEBSIELLA PNEUMONIAE   Final  MRSA PCR SCREENING     Status: Abnormal   Collection  Time    08/04/12 10:27 PM      Result Value Range Status   MRSA by PCR POSITIVE (*) NEGATIVE Final   Comment:            The GeneXpert MRSA Assay (FDA     approved for NASAL specimens     only), is one component of a     comprehensive MRSA colonization     surveillance program. It is not     intended to diagnose MRSA     infection nor to guide or     monitor treatment for     MRSA infections.     RESULT CALLED TO, READ BACK BY AND VERIFIED WITH:     CLAYTON,M RN AT 0028 ON [id number] BY MCREYNOLDS,B     Labs: Basic Metabolic Panel:  Recent Labs Lab 08/04/12 2200 08/05/12 0115 08/05/12 1307 08/06/12 0445 08/07/12 0503  NA 136 135 136 134* 134*  K 5.7* 5.8* 5.1 4.4 4.1  CL 108 107 109 107 106  CO2 15* 15* 17* 16* 17*  GLUCOSE 159* 159* 287* 263* 205*  BUN 59* 61* 56* 51* 41*  CREATININE 2.36* 2.33* 2.25* 2.07* 1.81*  CALCIUM 8.9 9.0 8.8 8.7 9.2   Liver Function Tests:  Recent Labs Lab 08/05/12 0115  AST 11  ALT 9  ALKPHOS 56  BILITOT 0.2*  PROT 7.1  ALBUMIN 3.0*   No results found for this basename: LIPASE, AMYLASE,  in the last 168 hours No results found for this basename: AMMONIA,  in the last 168 hours CBC:  Recent Labs Lab 08/04/12 1626 08/04/12 1641 08/05/12 0115 08/06/12 0445 08/07/12 0503  WBC  --  6.6 5.3 6.3 5.4  NEUTROABS  --  5.1  --   --   --   HGB 11.6* 10.6* 9.7* 9.5* 9.6*  HCT 34.0* 33.7* 31.1* 29.8* 29.0*  MCV  --  94.9 95.7 94.9 92.9  PLT  --  250 177 195 183   Cardiac Enzymes: No results found for this basename: CKTOTAL, CKMB, CKMBINDEX, TROPONINI,  in the last 168 hours BNP: BNP (last 3 results) No results found for this basename: PROBNP,  in the last 8760 hours CBG:  Recent Labs Lab 08/06/12 0758 08/06/12 1210 08/06/12 1625 08/06/12 2159 08/07/12 0747  GLUCAP 229* 270* 210* 260* 196*     Signed:  Velvet Bathe  [facility] 08/07/2012, 9:24 AM

## 2012-08-07 NOTE — Progress Notes (Signed)
DC instructions given to pt, all questions and concerns were addressed, pt VSS, NAD, no new skin issues and intact, all belongings packed up for pt to take with him home. We are waiting on his wife to transport home. Will continue to monitor until gone from the unit.

## 2012-08-07 NOTE — Progress Notes (Signed)
Pt states that he is not being given any food although he just refused his 3rd meal today since 0700. Whenever the trays are delivered he is saying that it is not what wants and/or he does not like the food. Will continue to monitor.

## 2012-08-13 ENCOUNTER — Emergency Department (HOSPITAL_BASED_OUTPATIENT_CLINIC_OR_DEPARTMENT_OTHER): Payer: Medicare Other

## 2012-08-13 ENCOUNTER — Encounter (HOSPITAL_BASED_OUTPATIENT_CLINIC_OR_DEPARTMENT_OTHER): Payer: Self-pay | Admitting: Emergency Medicine

## 2012-08-13 ENCOUNTER — Inpatient Hospital Stay (HOSPITAL_BASED_OUTPATIENT_CLINIC_OR_DEPARTMENT_OTHER)
Admission: EM | Admit: 2012-08-13 | Discharge: 2012-08-21 | DRG: 871 | Disposition: A | Discharge status: Skilled Nursing Facility | Payer: Medicare Other | Attending: Internal Medicine | Admitting: Internal Medicine

## 2012-08-13 ENCOUNTER — Inpatient Hospital Stay (HOSPITAL_BASED_OUTPATIENT_CLINIC_OR_DEPARTMENT_OTHER): Payer: Medicare Other

## 2012-08-13 DIAGNOSIS — R6521 Severe sepsis with septic shock: Secondary | ICD-10-CM | POA: Diagnosis present

## 2012-08-13 DIAGNOSIS — Z86718 Personal history of other venous thrombosis and embolism: Secondary | ICD-10-CM

## 2012-08-13 DIAGNOSIS — N183 Chronic kidney disease, stage 3 unspecified: Secondary | ICD-10-CM

## 2012-08-13 DIAGNOSIS — F3289 Other specified depressive episodes: Secondary | ICD-10-CM | POA: Diagnosis present

## 2012-08-13 DIAGNOSIS — E039 Hypothyroidism, unspecified: Secondary | ICD-10-CM | POA: Diagnosis present

## 2012-08-13 DIAGNOSIS — Z6841 Body Mass Index (BMI) 40.0 and over, adult: Secondary | ICD-10-CM | POA: Diagnosis not present

## 2012-08-13 DIAGNOSIS — R6520 Severe sepsis without septic shock: Secondary | ICD-10-CM | POA: Diagnosis present

## 2012-08-13 DIAGNOSIS — R079 Chest pain, unspecified: Secondary | ICD-10-CM | POA: Diagnosis not present

## 2012-08-13 DIAGNOSIS — L8990 Pressure ulcer of unspecified site, unspecified stage: Secondary | ICD-10-CM | POA: Diagnosis present

## 2012-08-13 DIAGNOSIS — S88119A Complete traumatic amputation at level between knee and ankle, unspecified lower leg, initial encounter: Secondary | ICD-10-CM

## 2012-08-13 DIAGNOSIS — E875 Hyperkalemia: Secondary | ICD-10-CM | POA: Diagnosis not present

## 2012-08-13 DIAGNOSIS — G9340 Encephalopathy, unspecified: Secondary | ICD-10-CM | POA: Diagnosis not present

## 2012-08-13 DIAGNOSIS — N12 Tubulo-interstitial nephritis, not specified as acute or chronic: Secondary | ICD-10-CM | POA: Diagnosis present

## 2012-08-13 DIAGNOSIS — Z4789 Encounter for other orthopedic aftercare: Secondary | ICD-10-CM | POA: Diagnosis not present

## 2012-08-13 DIAGNOSIS — E119 Type 2 diabetes mellitus without complications: Secondary | ICD-10-CM | POA: Diagnosis present

## 2012-08-13 DIAGNOSIS — N179 Acute kidney failure, unspecified: Secondary | ICD-10-CM | POA: Diagnosis present

## 2012-08-13 DIAGNOSIS — N289 Disorder of kidney and ureter, unspecified: Secondary | ICD-10-CM | POA: Diagnosis not present

## 2012-08-13 DIAGNOSIS — L03116 Cellulitis of left lower limb: Secondary | ICD-10-CM | POA: Diagnosis present

## 2012-08-13 DIAGNOSIS — E872 Acidosis, unspecified: Secondary | ICD-10-CM | POA: Diagnosis present

## 2012-08-13 DIAGNOSIS — G4733 Obstructive sleep apnea (adult) (pediatric): Secondary | ICD-10-CM | POA: Diagnosis present

## 2012-08-13 DIAGNOSIS — K5730 Diverticulosis of large intestine without perforation or abscess without bleeding: Secondary | ICD-10-CM

## 2012-08-13 DIAGNOSIS — I82409 Acute embolism and thrombosis of unspecified deep veins of unspecified lower extremity: Secondary | ICD-10-CM

## 2012-08-13 DIAGNOSIS — J9819 Other pulmonary collapse: Secondary | ICD-10-CM | POA: Diagnosis not present

## 2012-08-13 DIAGNOSIS — D62 Acute posthemorrhagic anemia: Secondary | ICD-10-CM | POA: Diagnosis not present

## 2012-08-13 DIAGNOSIS — E1121 Type 2 diabetes mellitus with diabetic nephropathy: Secondary | ICD-10-CM | POA: Diagnosis present

## 2012-08-13 DIAGNOSIS — E662 Morbid (severe) obesity with alveolar hypoventilation: Secondary | ICD-10-CM | POA: Diagnosis present

## 2012-08-13 DIAGNOSIS — A419 Sepsis, unspecified organism: Secondary | ICD-10-CM | POA: Diagnosis present

## 2012-08-13 DIAGNOSIS — J9601 Acute respiratory failure with hypoxia: Secondary | ICD-10-CM

## 2012-08-13 DIAGNOSIS — L0390 Cellulitis, unspecified: Secondary | ICD-10-CM | POA: Diagnosis not present

## 2012-08-13 DIAGNOSIS — N1330 Unspecified hydronephrosis: Secondary | ICD-10-CM | POA: Diagnosis not present

## 2012-08-13 DIAGNOSIS — E876 Hypokalemia: Secondary | ICD-10-CM | POA: Diagnosis present

## 2012-08-13 DIAGNOSIS — G8929 Other chronic pain: Secondary | ICD-10-CM | POA: Diagnosis not present

## 2012-08-13 DIAGNOSIS — J9600 Acute respiratory failure, unspecified whether with hypoxia or hypercapnia: Secondary | ICD-10-CM | POA: Diagnosis not present

## 2012-08-13 DIAGNOSIS — I872 Venous insufficiency (chronic) (peripheral): Secondary | ICD-10-CM | POA: Diagnosis present

## 2012-08-13 DIAGNOSIS — Z5189 Encounter for other specified aftercare: Secondary | ICD-10-CM | POA: Diagnosis not present

## 2012-08-13 DIAGNOSIS — L02419 Cutaneous abscess of limb, unspecified: Secondary | ICD-10-CM | POA: Diagnosis present

## 2012-08-13 DIAGNOSIS — S37009A Unspecified injury of unspecified kidney, initial encounter: Secondary | ICD-10-CM | POA: Diagnosis not present

## 2012-08-13 DIAGNOSIS — N3289 Other specified disorders of bladder: Secondary | ICD-10-CM | POA: Diagnosis present

## 2012-08-13 DIAGNOSIS — D649 Anemia, unspecified: Secondary | ICD-10-CM

## 2012-08-13 DIAGNOSIS — N200 Calculus of kidney: Secondary | ICD-10-CM | POA: Diagnosis present

## 2012-08-13 DIAGNOSIS — M1990 Unspecified osteoarthritis, unspecified site: Secondary | ICD-10-CM | POA: Diagnosis present

## 2012-08-13 DIAGNOSIS — E785 Hyperlipidemia, unspecified: Secondary | ICD-10-CM

## 2012-08-13 DIAGNOSIS — R944 Abnormal results of kidney function studies: Secondary | ICD-10-CM | POA: Diagnosis not present

## 2012-08-13 DIAGNOSIS — N270 Small kidney, unilateral: Secondary | ICD-10-CM | POA: Diagnosis not present

## 2012-08-13 DIAGNOSIS — E669 Obesity, unspecified: Secondary | ICD-10-CM

## 2012-08-13 DIAGNOSIS — Z992 Dependence on renal dialysis: Secondary | ICD-10-CM

## 2012-08-13 DIAGNOSIS — E874 Mixed disorder of acid-base balance: Secondary | ICD-10-CM | POA: Diagnosis present

## 2012-08-13 DIAGNOSIS — Z794 Long term (current) use of insulin: Secondary | ICD-10-CM | POA: Diagnosis present

## 2012-08-13 DIAGNOSIS — I120 Hypertensive chronic kidney disease with stage 5 chronic kidney disease or end stage renal disease: Secondary | ICD-10-CM | POA: Diagnosis present

## 2012-08-13 DIAGNOSIS — N201 Calculus of ureter: Secondary | ICD-10-CM | POA: Diagnosis present

## 2012-08-13 DIAGNOSIS — N139 Obstructive and reflux uropathy, unspecified: Secondary | ICD-10-CM | POA: Diagnosis present

## 2012-08-13 DIAGNOSIS — R6889 Other general symptoms and signs: Secondary | ICD-10-CM | POA: Diagnosis not present

## 2012-08-13 DIAGNOSIS — Z96649 Presence of unspecified artificial hip joint: Secondary | ICD-10-CM

## 2012-08-13 DIAGNOSIS — Z7901 Long term (current) use of anticoagulants: Secondary | ICD-10-CM

## 2012-08-13 DIAGNOSIS — I890 Lymphedema, not elsewhere classified: Secondary | ICD-10-CM | POA: Diagnosis not present

## 2012-08-13 DIAGNOSIS — N186 End stage renal disease: Secondary | ICD-10-CM

## 2012-08-13 DIAGNOSIS — K921 Melena: Secondary | ICD-10-CM

## 2012-08-13 DIAGNOSIS — N390 Urinary tract infection, site not specified: Secondary | ICD-10-CM | POA: Diagnosis not present

## 2012-08-13 DIAGNOSIS — B3741 Candidal cystitis and urethritis: Secondary | ICD-10-CM

## 2012-08-13 DIAGNOSIS — R369 Urethral discharge, unspecified: Secondary | ICD-10-CM | POA: Diagnosis present

## 2012-08-13 DIAGNOSIS — B3749 Other urogenital candidiasis: Secondary | ICD-10-CM | POA: Diagnosis present

## 2012-08-13 DIAGNOSIS — R791 Abnormal coagulation profile: Secondary | ICD-10-CM | POA: Diagnosis present

## 2012-08-13 DIAGNOSIS — I10 Essential (primary) hypertension: Secondary | ICD-10-CM

## 2012-08-13 DIAGNOSIS — Z87442 Personal history of urinary calculi: Secondary | ICD-10-CM

## 2012-08-13 DIAGNOSIS — N170 Acute kidney failure with tubular necrosis: Secondary | ICD-10-CM | POA: Diagnosis not present

## 2012-08-13 DIAGNOSIS — F329 Major depressive disorder, single episode, unspecified: Secondary | ICD-10-CM | POA: Diagnosis present

## 2012-08-13 DIAGNOSIS — Z22322 Carrier or suspected carrier of Methicillin resistant Staphylococcus aureus: Secondary | ICD-10-CM

## 2012-08-13 DIAGNOSIS — E11649 Type 2 diabetes mellitus with hypoglycemia without coma: Secondary | ICD-10-CM

## 2012-08-13 DIAGNOSIS — I739 Peripheral vascular disease, unspecified: Secondary | ICD-10-CM | POA: Diagnosis present

## 2012-08-13 DIAGNOSIS — R319 Hematuria, unspecified: Secondary | ICD-10-CM | POA: Diagnosis not present

## 2012-08-13 LAB — CBC WITH DIFFERENTIAL/PLATELET
Basophils Absolute: 0 10*3/uL (ref 0.0–0.1)
Basophils Relative: 0 % (ref 0–1)
Eosinophils Absolute: 0.1 10*3/uL (ref 0.0–0.7)
Hemoglobin: 10 g/dL — ABNORMAL LOW (ref 13.0–17.0)
Lymphocytes Relative: 11 % — ABNORMAL LOW (ref 12–46)
MCHC: 32.7 g/dL (ref 30.0–36.0)
Neutrophils Relative %: 76 % (ref 43–77)
RDW: 17.8 % — ABNORMAL HIGH (ref 11.5–15.5)

## 2012-08-13 LAB — COMPREHENSIVE METABOLIC PANEL
Alkaline Phosphatase: 119 U/L — ABNORMAL HIGH (ref 39–117)
BUN: 118 mg/dL — ABNORMAL HIGH (ref 6–23)
Calcium: 9.5 mg/dL (ref 8.4–10.5)
GFR calc Af Amer: 12 mL/min — ABNORMAL LOW (ref >90)
Glucose, Bld: 115 mg/dL — ABNORMAL HIGH (ref 70–99)
Potassium: 6.5 mEq/L (ref 3.5–5.1)
Total Protein: 7 g/dL (ref 6.0–8.3)

## 2012-08-13 LAB — URINALYSIS, ROUTINE W REFLEX MICROSCOPIC
Bilirubin Urine: NEGATIVE
Ketones, ur: 15 mg/dL — AB
Protein, ur: >300 mg/dL — AB
Urobilinogen, UA: 0.2 mg/dL (ref 0.0–1.0)

## 2012-08-13 LAB — POCT I-STAT 3, ART BLOOD GAS (G3+)
Patient temperature: 97.8
TCO2: 9 mmol/L (ref 0–100)
pH, Arterial: 7.11 — CL (ref 7.350–7.450)

## 2012-08-13 LAB — URINE MICROSCOPIC-ADD ON

## 2012-08-13 LAB — LIPASE, BLOOD: Lipase: 27 U/L (ref 11–59)

## 2012-08-13 MED ORDER — SODIUM CHLORIDE 0.9 % IV SOLN
1000.0000 mL | INTRAVENOUS | Status: DC
  Administered 2012-08-13 (×2): 1000 mL via INTRAVENOUS

## 2012-08-13 MED ORDER — ROCURONIUM BROMIDE 50 MG/5ML IV SOLN
INTRAVENOUS | Status: AC
  Filled 2012-08-13: qty 2

## 2012-08-13 MED ORDER — SODIUM BICARBONATE 8.4 % IV SOLN
Freq: Once | INTRAVENOUS | Status: DC
  Administered 2012-08-13: via INTRAVENOUS
  Filled 2012-08-13 (×2): qty 50

## 2012-08-13 MED ORDER — SODIUM CHLORIDE 0.9 % IV SOLN
1000.0000 mL | Freq: Once | INTRAVENOUS | Status: AC
  Administered 2012-08-13: 1000 mL via INTRAVENOUS

## 2012-08-13 MED ORDER — ETOMIDATE 2 MG/ML IV SOLN
INTRAVENOUS | Status: DC | PRN
  Administered 2012-08-13: 20 mg via INTRAVENOUS

## 2012-08-13 MED ORDER — VANCOMYCIN HCL 10 G IV SOLR
1.0000 g | Freq: Once | INTRAVENOUS | Status: AC
  Administered 2012-08-13: 1 g via INTRAVENOUS
  Filled 2012-08-13: qty 1000

## 2012-08-13 MED ORDER — DEXTROSE 50 % IV SOLN
1.0000 | Freq: Once | INTRAVENOUS | Status: AC
  Administered 2012-08-13: 50 mL via INTRAVENOUS
  Filled 2012-08-13: qty 50

## 2012-08-13 MED ORDER — ETOMIDATE 2 MG/ML IV SOLN
INTRAVENOUS | Status: AC
  Filled 2012-08-13: qty 20

## 2012-08-13 MED ORDER — SODIUM BICARBONATE 8.4 % IV SOLN
50.0000 meq | Freq: Once | INTRAVENOUS | Status: DC
  Filled 2012-08-13: qty 50

## 2012-08-13 MED ORDER — PROPOFOL 10 MG/ML IV EMUL: 5.0000 ug/kg/min | INTRAVENOUS | Status: DC

## 2012-08-13 MED ORDER — SODIUM CHLORIDE 0.9 % IV SOLN
25.0000 ug/h | INTRAVENOUS | Status: DC
  Administered 2012-08-14: 50 ug/h via INTRAVENOUS
  Administered 2012-08-15: 100 ug/h via INTRAVENOUS
  Filled 2012-08-13 (×2): qty 50

## 2012-08-13 MED ORDER — LIDOCAINE HCL (CARDIAC) 20 MG/ML IV SOLN
INTRAVENOUS | Status: AC
  Filled 2012-08-13: qty 5

## 2012-08-13 MED ORDER — SODIUM CHLORIDE 0.9 % IV BOLUS (SEPSIS)
1000.0000 mL | Freq: Once | INTRAVENOUS | Status: AC
  Administered 2012-08-13: 1000 mL via INTRAVENOUS

## 2012-08-13 MED ORDER — PROPOFOL 10 MG/ML IV EMUL
INTRAVENOUS | Status: AC
  Administered 2012-08-13: 10 ug/kg/min via INTRAVENOUS
  Filled 2012-08-13: qty 100

## 2012-08-13 MED ORDER — PROPOFOL 10 MG/ML IV BOLUS
INTRAVENOUS | Status: AC
  Filled 2012-08-13: qty 20

## 2012-08-13 MED ORDER — DEXTROSE 5 % IV SOLN
1.0000 g | Freq: Once | INTRAVENOUS | Status: AC
  Administered 2012-08-13: 1 g via INTRAVENOUS
  Filled 2012-08-13: qty 1

## 2012-08-13 MED ORDER — DEXTROSE 5 % IV SOLN
1.0000 g | Freq: Once | INTRAVENOUS | Status: AC
  Administered 2012-08-13: 1 g via INTRAVENOUS
  Filled 2012-08-13: qty 10

## 2012-08-13 MED ORDER — ROCURONIUM BROMIDE 50 MG/5ML IV SOLN
0.6000 mg/kg | Freq: Once | INTRAVENOUS | Status: AC
  Administered 2012-08-13: 78.9 mg via INTRAVENOUS

## 2012-08-13 MED ORDER — INSULIN ASPART 100 UNIT/ML IV SOLN
10.0000 [IU] | Freq: Once | INTRAVENOUS | Status: AC
  Administered 2012-08-13: 10 [IU] via INTRAVENOUS
  Filled 2012-08-13: qty 1
  Filled 2012-08-13: qty 0.1

## 2012-08-13 MED ORDER — ETOMIDATE 2 MG/ML IV SOLN
20.0000 mg | Freq: Once | INTRAVENOUS | Status: AC
  Administered 2012-08-13: 20 mg via INTRAVENOUS

## 2012-08-13 MED ORDER — SODIUM CHLORIDE 0.9 % IV SOLN
1.0000 g | Freq: Once | INTRAVENOUS | Status: AC
  Administered 2012-08-13: 1 g via INTRAVENOUS
  Filled 2012-08-13: qty 10

## 2012-08-13 MED ORDER — FENTANYL BOLUS VIA INFUSION
25.0000 ug | Freq: Four times a day (QID) | INTRAVENOUS | Status: DC | PRN
  Filled 2012-08-13: qty 100

## 2012-08-13 MED ORDER — ONDANSETRON HCL 4 MG/2ML IJ SOLN
4.0000 mg | Freq: Once | INTRAMUSCULAR | Status: AC
  Administered 2012-08-13: 4 mg via INTRAVENOUS
  Filled 2012-08-13: qty 2

## 2012-08-13 MED ORDER — CEFTAZIDIME 1 G IJ SOLR
1.0000 g | Freq: Once | INTRAMUSCULAR | Status: DC
  Filled 2012-08-13: qty 1

## 2012-08-13 MED ORDER — SUCCINYLCHOLINE CHLORIDE 20 MG/ML IJ SOLN
INTRAMUSCULAR | Status: AC
  Filled 2012-08-13: qty 5

## 2012-08-13 NOTE — ED Notes (Signed)
Pt reports that he was seen at [facility] long a week ago and was told that he had kidney stones but that they were not ready to be passed, pt was sent home, reports increasing pain, wife reports that patient has had this issue for past 6 months, pt was cathed on arrival and 30 cc of dark red urine obtained, pt has left bka and lymphadema to right lower extrementy

## 2012-08-13 NOTE — ED Notes (Signed)
Called ems for abdominal pain, recently seen at [facility] long for kidney stones, pt reports being unable to urinate

## 2012-08-13 NOTE — ED Notes (Signed)
Patient transported to CT 

## 2012-08-13 NOTE — ED Provider Notes (Signed)
History    CSN: [id number] Arrival date & time 08/13/12  2007 First MD Initiated Contact with Patient 08/13/12 2012      Chief Complaint  Patient presents with  . Flank Pain    HPI The patient presents to the emergency room with complaints of recurrent flank and abdominal pain. Patient has history of kidney stones. He was recently seen in the hospital for similar complaints. He was told he had stones in his kidney but they were not ready to pass it. That since he left the hospital his past at least 2-3 kidney stones. Today he also felt like he was having difficulty urinating and could not urinate properly he came to the emergency room. Patient denies any vomiting or fever. He denies any diarrhea. The patient did take some of his pain medications at home before arriving to the emergency department. He does feel very sleepy and lethargic. Past Medical History  Diagnosis Date  . Sleep apnea     uses cpap  . Hypothyroidism   . Anemia   . Blood transfusion   . Chronic kidney disease     hx of kidney stones  . Hypertension   . Arthritis     osteoarthritis  . Pneumonia   . Diabetes mellitus     insulin dependent    Past Surgical History  Procedure Laterality Date  . Bilteral hip      replacement & revison  's   . Knee surgery      left knee    . Tonsillectomy    . Amputation  02/07/2011    Procedure: AMPUTATION BELOW KNEE;  Surgeon: Newt Minion, MD;  Location: [facility];  Service: Orthopedics;  Laterality: Left;  Left Below Knee Amputation    Family History  Problem Relation Age of Onset  . Diabetes Mother   . Colon cancer Father     History  Substance Use Topics  . Smoking status: Never Smoker   . Smokeless tobacco: Never Used  . Alcohol Use: No      Review of Systems  Constitutional: Negative for fever.  Respiratory: Negative for cough.   Genitourinary: Positive for dysuria.  Neurological: Negative for headaches.  All other systems reviewed and are  negative.    Allergies  Review of patient's allergies indicates no known allergies.  Home Medications   Current Outpatient Rx  Name  Route  Sig  Dispense  Refill  . cefdinir (OMNICEF) 300 MG capsule   Oral   Take 1 capsule (300 mg total) by mouth 2 (two) times daily.   14 capsule   0   . cyclobenzaprine (FLEXERIL) 5 MG tablet   Oral   Take 1 tablet (5 mg total) by mouth at bedtime.   30 tablet   0   . HYDROcodone-acetaminophen (NORCO/VICODIN) 5-325 MG per tablet   Oral   Take 1 tablet by mouth every 6 (six) hours as needed for pain. .   30 tablet   0   . insulin glargine (LANTUS) 100 UNIT/ML injection   Subcutaneous   Inject 0.2 mLs (20 Units total) into the skin 2 (two) times daily.   10 mL   12   . oxyCODONE (OXY IR/ROXICODONE) 5 MG immediate release tablet   Oral   Take 1 tablet (5 mg total) by mouth every 4 (four) hours as needed for pain.   30 tablet   0   . ALPHA LIPOIC ACID PO   Oral   Take 1  tablet by mouth daily.           . Ascorbic Acid (VITAMIN C PO)   Oral   Take 1 tablet by mouth daily.           . ATORVASTATIN CALCIUM PO   Oral   Take 40 mg by mouth daily.          . ferrous sulfate 325 (65 FE) MG tablet   Oral   Take 325 mg by mouth 2 (two) times daily.           Marland Kitchen FOLIC ACID PO   Oral   Take 1 tablet by mouth daily.           Marland Kitchen GARLIC PO   Oral   Take 1 tablet by mouth daily.           Marland Kitchen gemfibrozil (LOPID) 600 MG tablet   Oral   Take 600 mg by mouth 2 (two) times daily before a meal.         . insulin aspart (NOVOLOG) 100 UNIT/ML injection   Subcutaneous   Inject into the skin 2 (two) times daily. 100-150=3u, 151-200=4u, 201-250-7, 251-300=9, 301-350=11, over 351=14u         . levothyroxine (SYNTHROID, LEVOTHROID) 200 MCG tablet   Oral   Take 200 mcg by mouth daily.           . Multiple Vitamins-Minerals (MULTIVITAMINS THER. W/MINERALS) TABS   Oral   Take 1 tablet by mouth daily.           . Omega-3  Fatty Acids (FISH OIL PO)   Oral   Take 3 tablets by mouth 2 (two) times daily.           . Tamsulosin HCl (FLOMAX) 0.4 MG CAPS   Oral   Take 0.4 mg by mouth daily.           Marland Kitchen VITAMIN E PO   Oral   Take 1 tablet by mouth daily.           Marland Kitchen warfarin (COUMADIN) 3 MG tablet   Oral   Take 3-4.5 mg by mouth daily. 3mg , 3mg , 4.5mg  then repeat           BP 94/47  Pulse 102  Temp(Src) 97.4 F (36.3 C) (Oral)  Resp 22  Wt 290 lb (131.543 kg)  BMI 40.46 kg/m2  SpO2 100%  Physical Exam  Constitutional: No distress.  Morbidly obese, chronically ill-appearing  HENT:  Head: Normocephalic.  Mouth/Throat: No oropharyngeal exudate.  Eyes: Right eye exhibits no discharge. Left eye exhibits no discharge. No scleral icterus.  Neck: Normal range of motion. Neck supple. No thyromegaly present.  Cardiovascular: Normal rate, regular rhythm and normal heart sounds.   No murmur heard. Pulmonary/Chest: Effort normal and breath sounds normal. No respiratory distress. He has no wheezes.  Abdominal: Soft. He exhibits no abdominal bruit, no ascites and no mass. There is generalized tenderness. There is CVA tenderness. There is no rigidity, no rebound, no guarding and negative Murphy's sign. No hernia.  Obese, protuberant, mild diffuse generalized tenderness  Genitourinary:  Normal male external genitalia, Foley catheter in place  Musculoskeletal:  Status post left BKA, chronic edema in the next day since changes, no evidence of cellulitis, no cyanosis  Neurological: He is alert. He is not disoriented. No cranial nerve deficit or sensory deficit. He exhibits normal muscle tone. GCS eye subscore is 4. GCS verbal subscore is 5. GCS motor subscore is 6.  Patient is somewhat somnolent although will easily provide history once spoken to  Skin: He is not diaphoretic.    ED Course  INTUBATION Date/Time: 08/13/2012 11:37 PM Performed by: Dorie Rank R Authorized by: Dorie Rank R Consent: Verbal  consent obtained. Indications: airway protection Intubation method: video-assisted Patient status: sedated Preoxygenation: nonrebreather mask Sedatives: etomidate Paralytic: rocuronium Laryngoscope size: Mac 4 Tube size: 8.0 mm Tube type: cuffed Number of attempts: 1 Cords visualized: yes Post-procedure assessment: chest rise and ETCO2 monitor Breath sounds: equal Cuff inflated: yes ETT to lip: 25 cm Tube secured with: ETT holder Chest x-ray interpreted by me. Chest x-ray findings: endotracheal tube in appropriate position Patient tolerance: Patient tolerated the procedure well with no immediate complications.  CRITICAL CARE Performed by: Dorie Rank R Authorized by: Dorie Rank R Total critical care time: 45 minutes Critical care time was exclusive of separately billable procedures and treating other patients. Critical care was necessary to treat or prevent imminent or life-threatening deterioration of the following conditions: metabolic crisis. Critical care was time spent personally by me on the following activities: development of treatment plan with patient or surrogate, discussions with consultants, evaluation of patient's response to treatment, examination of patient, obtaining history from patient or surrogate, ordering and performing treatments and interventions, ordering and review of laboratory studies, ordering and review of radiographic studies, pulse oximetry, re-evaluation of patient's condition, review of old charts and ventilator management.   (including critical care time) Foley catheter placed by nursing staff: Small amount of red urine less than 100 cc, no sign of urinary retention 1000pm  Electrolytes show worsening renal failure.  Pt acidotic and hyperkalemic.  Will treat with sodium bicarb, calcdium gluconate, insulin and d50, start IV abx for uti.  Pt will need to be transferred to a hospital for further treatment. 1018 pm.  Case discussed with Dr Titus Mould.   Additional orders placed.  Pt will be transferred to ICU.  Will check ABG.  Protecting airway now but may end up requiring intubation prior to transfer. 2336 Pt intubated successfully for stabilization prior to transfer.  Will set vent rate to hyperventilate  Medications  0.9 %  sodium chloride infusion (1,000 mLs Intravenous New Bag/Given 08/13/12 2108)    Followed by  0.9 %  sodium chloride infusion (1,000 mLs Intravenous New Bag/Given 08/13/12 2109)  cefTRIAXone (ROCEPHIN) 1 g in dextrose 5 % 50 mL IVPB (not administered)  insulin aspart (novoLOG) injection 10 Units (not administered)  dextrose 50 % solution 50 mL (not administered)  sodium bicarbonate injection 50 mEq (not administered)  calcium gluconate 1 g in sodium chloride 0.9 % 100 mL IVPB (not administered)  sodium chloride 0.9 % bolus 1,000 mL (not administered)  ondansetron (ZOFRAN) injection 4 mg (4 mg Intravenous Given 08/13/12 2108)     Labs Reviewed  URINALYSIS, ROUTINE W REFLEX MICROSCOPIC - Abnormal; Notable for the following:    Color, Urine RED (*)    APPearance TURBID (*)    Hgb urine dipstick LARGE (*)    Ketones, ur 15 (*)    Protein, ur >300 (*)    Leukocytes, UA LARGE (*)    All other components within normal limits  COMPREHENSIVE METABOLIC PANEL - Abnormal; Notable for the following:    Potassium 6.5 (*)    CO2 10 (*)    Glucose, Bld 115 (*)    BUN 118 (*)    Creatinine, Ser 5.60 (*)    Albumin 2.8 (*)    Alkaline Phosphatase 119 (*)  Total Bilirubin 0.2 (*)    GFR calc non Af Amer 10 (*)    GFR calc Af Amer 12 (*)    All other components within normal limits  CBC WITH DIFFERENTIAL - Abnormal; Notable for the following:    RBC 3.16 (*)    Hemoglobin 10.0 (*)    HCT 30.6 (*)    RDW 17.8 (*)    Lymphocytes Relative 11 (*)    All other components within normal limits  URINE MICROSCOPIC-ADD ON - Abnormal; Notable for the following:    Squamous Epithelial / LPF FEW (*)    Bacteria, UA FEW (*)     All other components within normal limits  URINE CULTURE  LIPASE, BLOOD  GLUCOSE, CAPILLARY  PROTIME-INR   Ct Abdomen Pelvis Wo Contrast  08/13/2012   *RADIOLOGY REPORT*  Clinical Data: Abdominal pain and unable to urinate.  CT ABDOMEN AND PELVIS WITHOUT CONTRAST  Technique:  Multidetector CT imaging of the abdomen and pelvis was performed following the standard protocol without intravenous contrast.  Comparison: 08/04/2012  Findings: Lung bases are clear.  No evidence for pneumoperitoneum.  High density structure in the gallbladder is consistent with a gallstone.  No gross abnormality to the liver, spleen or pancreas. Normal appearance of the adrenal tissue.  Again noted is bilateral perinephric edema or stranding.  There is increased dilatation and stranding around the proximal right ureter.  Again seen are stones within the right kidney.  The largest right kidney stone measures 8 mm.  No clear evidence for a right ureter stone but the distal right ureter and the ureterovesical junction are obscured by the streak artifact from the bilateral hip replacements. However, compared to the previous examination, there appears to be one less stone within the right kidney and it is possible that a small stone has migrated into the distal right ureter.  There is now a punctate stone in the left renal pelvis which is was not present on the prior examination. There is at least mild left hydronephrosis.  There is increased edema in the retroperitoneum since the prior examination.  Again noted are small lymph nodes in the periaortic region.  The urinary bladder is decompressed.  No acute bony abnormality.  IMPRESSION: Bilateral renal calculi.   A 3 mm stone has migrated to the left renal pelvis.  There is mild left hydronephrosis.  In general, there appears to be increased edema and stranding around the ureters bilaterally.  Mild dilatation of both ureters compared with the previous examination.  Difficult to exclude  distal ureter or ureterovesical stones because of artifact in the pelvis.  It is possible that a small stone has migrated from the right kidney because there appears to be one less small stone in the right kidney compared to the prior examination.  Gallstone.   Original Report Authenticated By: Markus Daft, M.D.     1. UTI (urinary tract infection)   2. Kidney stones   3. Acute renal failure   4. Hyperkalemia       MDM  Pt has recurrent worsening renal failure.  CT scan also shows ureteral stones with mild obstruction.    THe pt has borderlines blood pressures here.  He remains somnolent.  SOme of this may be from the uremia.  Considering his multiple issues and the severity of his renal failure I will consult with the critical care service regarding possible admission.  Pt was previously on the hospitalist service at [facility] but he may end up  requiring nephrology consultation.        Kathalene Frames, MD 08/13/12 [phone]

## 2012-08-14 ENCOUNTER — Inpatient Hospital Stay (HOSPITAL_COMMUNITY): Payer: Medicare Other

## 2012-08-14 DIAGNOSIS — L03116 Cellulitis of left lower limb: Secondary | ICD-10-CM | POA: Diagnosis present

## 2012-08-14 DIAGNOSIS — N200 Calculus of kidney: Secondary | ICD-10-CM | POA: Diagnosis present

## 2012-08-14 DIAGNOSIS — J9601 Acute respiratory failure with hypoxia: Secondary | ICD-10-CM

## 2012-08-14 DIAGNOSIS — N270 Small kidney, unilateral: Secondary | ICD-10-CM

## 2012-08-14 DIAGNOSIS — N179 Acute kidney failure, unspecified: Secondary | ICD-10-CM | POA: Diagnosis present

## 2012-08-14 DIAGNOSIS — E875 Hyperkalemia: Secondary | ICD-10-CM

## 2012-08-14 DIAGNOSIS — J9600 Acute respiratory failure, unspecified whether with hypoxia or hypercapnia: Secondary | ICD-10-CM

## 2012-08-14 DIAGNOSIS — N390 Urinary tract infection, site not specified: Secondary | ICD-10-CM

## 2012-08-14 DIAGNOSIS — L0291 Cutaneous abscess, unspecified: Secondary | ICD-10-CM

## 2012-08-14 HISTORY — DX: Cellulitis of left lower limb: L03.116

## 2012-08-14 LAB — LACTIC ACID, PLASMA: Lactic Acid, Venous: 0.7 mmol/L (ref 0.5–2.2)

## 2012-08-14 LAB — BASIC METABOLIC PANEL
CO2: 13 mEq/L — ABNORMAL LOW (ref 19–32)
Calcium: 8.4 mg/dL (ref 8.4–10.5)
Chloride: 110 mEq/L (ref 96–112)
Glucose, Bld: 195 mg/dL — ABNORMAL HIGH (ref 70–99)
Potassium: 4.7 mEq/L (ref 3.5–5.1)
Sodium: 139 mEq/L (ref 135–145)

## 2012-08-14 LAB — PROTIME-INR
INR: 3.96 — ABNORMAL HIGH (ref 0.00–1.49)
Prothrombin Time: 36.3 seconds — ABNORMAL HIGH (ref 11.6–15.2)

## 2012-08-14 LAB — CBC
HCT: 25.5 % — ABNORMAL LOW (ref 39.0–52.0)
MCH: 30.4 pg (ref 26.0–34.0)
MCH: 30.1 pg (ref 26.0–34.0)
MCHC: 32.3 g/dL (ref 30.0–36.0)
MCV: 94.1 fL (ref 78.0–100.0)
MCV: 92.4 fL (ref 78.0–100.0)
Platelets: 242 10*3/uL (ref 150–400)
RBC: 3.06 MIL/uL — ABNORMAL LOW (ref 4.22–5.81)
RBC: 2.76 MIL/uL — ABNORMAL LOW (ref 4.22–5.81)
RDW: 18.2 % — ABNORMAL HIGH (ref 11.5–15.5)
WBC: 5.6 10*3/uL (ref 4.0–10.5)

## 2012-08-14 LAB — URINE CULTURE
Colony Count: NO GROWTH
Culture: NO GROWTH

## 2012-08-14 LAB — GLUCOSE, CAPILLARY
Glucose-Capillary: 166 mg/dL — ABNORMAL HIGH (ref 70–99)
Glucose-Capillary: 171 mg/dL — ABNORMAL HIGH (ref 70–99)
Glucose-Capillary: 177 mg/dL — ABNORMAL HIGH (ref 70–99)
Glucose-Capillary: 198 mg/dL — ABNORMAL HIGH (ref 70–99)

## 2012-08-14 LAB — RENAL FUNCTION PANEL
CO2: 19 mEq/L (ref 19–32)
Calcium: 8.2 mg/dL — ABNORMAL LOW (ref 8.4–10.5)
GFR calc Af Amer: 23 mL/min — ABNORMAL LOW (ref >90)
Glucose, Bld: 195 mg/dL — ABNORMAL HIGH (ref 70–99)
Sodium: 139 mEq/L (ref 135–145)

## 2012-08-14 LAB — POCT I-STAT 3, ART BLOOD GAS (G3+)
Acid-base deficit: 20 mmol/L — ABNORMAL HIGH (ref 0.0–2.0)
Acid-base deficit: 10 mmol/L — ABNORMAL HIGH (ref 0.0–2.0)
Acid-base deficit: 4 mmol/L — ABNORMAL HIGH (ref 0.0–2.0)
Bicarbonate: 8.3 mEq/L — ABNORMAL LOW (ref 20.0–24.0)
Bicarbonate: 15.5 mEq/L — ABNORMAL LOW (ref 20.0–24.0)
Bicarbonate: 20.2 mEq/L (ref 20.0–24.0)
Patient temperature: 98.1
TCO2: 17 mmol/L (ref 0–100)
pCO2 arterial: 25.6 mmHg — ABNORMAL LOW (ref 35.0–45.0)
pCO2 arterial: 32.5 mmHg — ABNORMAL LOW (ref 35.0–45.0)
pH, Arterial: 7.115 — CL (ref 7.350–7.450)
pH, Arterial: 7.274 — ABNORMAL LOW (ref 7.350–7.450)
pO2, Arterial: 192 mmHg — ABNORMAL HIGH (ref 80.0–100.0)
pO2, Arterial: 185 mmHg — ABNORMAL HIGH (ref 80.0–100.0)

## 2012-08-14 LAB — BLOOD GAS, ARTERIAL
Bicarbonate: 8.8 mEq/L — ABNORMAL LOW (ref 20.0–24.0)
Drawn by: 36277
PEEP: 5 cmH2O
RATE: 18 resp/min
pCO2 arterial: 27.7 mmHg — ABNORMAL LOW (ref 35.0–45.0)
pH, Arterial: 7.126 — CL (ref 7.350–7.450)
pO2, Arterial: 169 mmHg — ABNORMAL HIGH (ref 80.0–100.0)

## 2012-08-14 LAB — DIC (DISSEMINATED INTRAVASCULAR COAGULATION)PANEL
INR: 2.84 — ABNORMAL HIGH (ref 0.00–1.49)
Smear Review: NONE SEEN
aPTT: 71 seconds — ABNORMAL HIGH (ref 24–37)

## 2012-08-14 LAB — HEPATIC FUNCTION PANEL
ALT: 13 U/L (ref 0–53)
AST: 10 U/L (ref 0–37)
Albumin: 2.4 g/dL — ABNORMAL LOW (ref 3.5–5.2)
Bilirubin, Direct: 0.1 mg/dL (ref 0.0–0.3)

## 2012-08-14 LAB — URINALYSIS, ROUTINE W REFLEX MICROSCOPIC
Glucose, UA: 100 mg/dL — AB
Ketones, ur: 40 mg/dL — AB
Protein, ur: >300 mg/dL — AB
Urobilinogen, UA: 1 mg/dL (ref 0.0–1.0)

## 2012-08-14 LAB — CORTISOL: Cortisol, Plasma: 18.3 ug/dL

## 2012-08-14 LAB — PHOSPHORUS: Phosphorus: 5.3 mg/dL — ABNORMAL HIGH (ref 2.3–4.6)

## 2012-08-14 LAB — SODIUM, URINE, RANDOM: Sodium, Ur: 77 mEq/L

## 2012-08-14 MED ORDER — VANCOMYCIN HCL IN DEXTROSE 1-5 GM/200ML-% IV SOLN
INTRAVENOUS | Status: AC
  Filled 2012-08-14: qty 200

## 2012-08-14 MED ORDER — CHLORHEXIDINE GLUCONATE 0.12 % MT SOLN
OROMUCOSAL | Status: AC
  Administered 2012-08-14: 15 mL
  Filled 2012-08-14: qty 15

## 2012-08-14 MED ORDER — PROPOFOL 10 MG/ML IV EMUL
5.0000 ug/kg/min | INTRAVENOUS | Status: DC
  Administered 2012-08-14 – 2012-08-15 (×2): 12 ug/kg/min via INTRAVENOUS
  Filled 2012-08-14: qty 100

## 2012-08-14 MED ORDER — PRISMASOL BGK 4/2.5 32-4-2.5 MEQ/L IV SOLN
INTRAVENOUS | Status: DC
  Administered 2012-08-14 – 2012-08-15 (×7): via INTRAVENOUS_CENTRAL
  Filled 2012-08-14 (×16): qty 5000

## 2012-08-14 MED ORDER — CHLORHEXIDINE GLUCONATE CLOTH 2 % EX PADS
6.0000 | MEDICATED_PAD | Freq: Every day | CUTANEOUS | Status: AC
  Administered 2012-08-14 – 2012-08-18 (×4): 6 via TOPICAL

## 2012-08-14 MED ORDER — CHLORHEXIDINE GLUCONATE 0.12 % MT SOLN
15.0000 mL | Freq: Two times a day (BID) | OROMUCOSAL | Status: DC
  Administered 2012-08-14 – 2012-08-15 (×2): 15 mL via OROMUCOSAL
  Filled 2012-08-14 (×2): qty 15

## 2012-08-14 MED ORDER — INSULIN ASPART 100 UNIT/ML ~~LOC~~ SOLN
0.0000 [IU] | SUBCUTANEOUS | Status: DC
  Administered 2012-08-14 – 2012-08-15 (×5): 2 [IU] via SUBCUTANEOUS
  Administered 2012-08-15 (×2): 1 [IU] via SUBCUTANEOUS

## 2012-08-14 MED ORDER — LEVOTHYROXINE SODIUM 200 MCG PO TABS
200.0000 ug | ORAL_TABLET | Freq: Every day | ORAL | Status: DC
  Administered 2012-08-14 – 2012-08-15 (×2): 200 ug
  Filled 2012-08-14 (×3): qty 1

## 2012-08-14 MED ORDER — HEPARIN SODIUM (PORCINE) 5000 UNIT/ML IJ SOLN: 5000.0000 [IU] | Freq: Three times a day (TID) | INTRAMUSCULAR | Status: DC

## 2012-08-14 MED ORDER — BIOTENE DRY MOUTH MT LIQD
15.0000 mL | Freq: Four times a day (QID) | OROMUCOSAL | Status: DC
  Administered 2012-08-14 – 2012-08-15 (×5): 15 mL via OROMUCOSAL

## 2012-08-14 MED ORDER — PRISMASOL BGK 4/2.5 32-4-2.5 MEQ/L IV SOLN
INTRAVENOUS | Status: DC
  Administered 2012-08-14 – 2012-08-15 (×2): via INTRAVENOUS_CENTRAL
  Filled 2012-08-14 (×3): qty 5000

## 2012-08-14 MED ORDER — CHLORHEXIDINE GLUCONATE 0.12 % MT SOLN: 15.0000 mL | Freq: Two times a day (BID) | OROMUCOSAL | Status: DC

## 2012-08-14 MED ORDER — MUPIROCIN CALCIUM 2 % EX CREA
TOPICAL_CREAM | Freq: Two times a day (BID) | CUTANEOUS | Status: DC
Start: 2012-08-14 — End: 2012-08-14
  Filled 2012-08-14: qty 15

## 2012-08-14 MED ORDER — PROPOFOL 10 MG/ML IV EMUL
5.0000 ug/kg/min | INTRAVENOUS | Status: DC
  Administered 2012-08-14 (×2): 8 ug/kg/min via INTRAVENOUS
  Administered 2012-08-14: 10 ug/kg/min via INTRAVENOUS
  Filled 2012-08-14 (×3): qty 100

## 2012-08-14 MED ORDER — VANCOMYCIN HCL 10 G IV SOLR
1500.0000 mg | Freq: Once | INTRAVENOUS | Status: AC
  Administered 2012-08-14: 1500 mg via INTRAVENOUS
  Filled 2012-08-14: qty 1500

## 2012-08-14 MED ORDER — SODIUM BICARBONATE 8.4 % IV SOLN
INTRAVENOUS | Status: DC
  Administered 2012-08-14 (×3): via INTRAVENOUS
  Filled 2012-08-14 (×8): qty 150

## 2012-08-14 MED ORDER — DEXTROSE 5 % IV SOLN
1.0000 g | INTRAVENOUS | Status: DC
  Administered 2012-08-14: 1 g via INTRAVENOUS
  Filled 2012-08-14: qty 1

## 2012-08-14 MED ORDER — HEPARIN SODIUM (PORCINE) 1000 UNIT/ML DIALYSIS
1000.0000 [IU] | INTRAMUSCULAR | Status: DC | PRN
  Administered 2012-08-15: 2800 [IU] via INTRAVENOUS_CENTRAL
  Filled 2012-08-14: qty 3
  Filled 2012-08-14: qty 6

## 2012-08-14 MED ORDER — SODIUM POLYSTYRENE SULFONATE 15 GM/60ML PO SUSP
30.0000 g | Freq: Once | ORAL | Status: AC
  Administered 2012-08-14: 30 g
  Filled 2012-08-14: qty 120

## 2012-08-14 MED ORDER — MUPIROCIN 2 % EX OINT
1.0000 "application " | TOPICAL_OINTMENT | Freq: Two times a day (BID) | CUTANEOUS | Status: AC
  Administered 2012-08-14 – 2012-08-19 (×10): 1 via NASAL
  Filled 2012-08-14: qty 22

## 2012-08-14 MED ORDER — VANCOMYCIN HCL 10 G IV SOLR
1250.0000 mg | INTRAVENOUS | Status: DC
  Administered 2012-08-15: 1250 mg via INTRAVENOUS
  Filled 2012-08-14: qty 1250

## 2012-08-14 MED ORDER — MUPIROCIN 2 % EX OINT
TOPICAL_OINTMENT | Freq: Two times a day (BID) | CUTANEOUS | Status: DC
Start: 2012-08-14 — End: 2012-08-14
  Filled 2012-08-14: qty 22

## 2012-08-14 MED ORDER — ALBUTEROL SULFATE HFA 108 (90 BASE) MCG/ACT IN AERS: 4.0000 | INHALATION_SPRAY | RESPIRATORY_TRACT | Status: DC | PRN

## 2012-08-14 MED ORDER — SODIUM CHLORIDE 0.9 % IV SOLN: 250.0000 mL | INTRAVENOUS | Status: DC | PRN

## 2012-08-14 MED ORDER — SODIUM CHLORIDE 0.9 % FOR CRRT
INTRAVENOUS_CENTRAL | Status: DC | PRN
  Filled 2012-08-14: qty 1000

## 2012-08-14 MED ORDER — PNEUMOCOCCAL VAC POLYVALENT 25 MCG/0.5ML IJ INJ
0.5000 mL | INJECTION | INTRAMUSCULAR | Status: AC
  Administered 2012-08-15: 0.5 mL via INTRAMUSCULAR
  Filled 2012-08-14: qty 0.5

## 2012-08-14 MED ORDER — PANTOPRAZOLE SODIUM 40 MG IV SOLR
40.0000 mg | Freq: Every day | INTRAVENOUS | Status: DC
Start: 2012-08-14 — End: 2012-08-16
  Administered 2012-08-14 – 2012-08-15 (×2): 40 mg via INTRAVENOUS
  Filled 2012-08-14 (×3): qty 40

## 2012-08-14 MED ORDER — SODIUM BICARBONATE 8.4 % IV SOLN: INTRAVENOUS | Status: DC

## 2012-08-14 MED ORDER — PHENYLEPHRINE HCL 10 MG/ML IJ SOLN
30.0000 ug/min | INTRAMUSCULAR | Status: DC
  Filled 2012-08-14: qty 1

## 2012-08-14 MED ORDER — PRISMASOL BGK 4/2.5 32-4-2.5 MEQ/L IV SOLN
INTRAVENOUS | Status: DC
  Administered 2012-08-14: 09:00:00 via INTRAVENOUS_CENTRAL
  Filled 2012-08-14 (×2): qty 5000

## 2012-08-14 MED ORDER — HEPARIN SODIUM (PORCINE) 1000 UNIT/ML IJ SOLN
INTRAMUSCULAR | Status: AC
  Administered 2012-08-14: 4000 [IU]
  Filled 2012-08-14: qty 4

## 2012-08-14 MED ORDER — DEXTROSE 5 % IV SOLN
2.0000 g | Freq: Two times a day (BID) | INTRAVENOUS | Status: DC
  Administered 2012-08-14 – 2012-08-18 (×8): 2 g via INTRAVENOUS
  Filled 2012-08-14 (×10): qty 2

## 2012-08-14 MED ORDER — DEXTROSE 5 % IV SOLN
30.0000 ug/min | INTRAVENOUS | Status: DC
Start: 2012-08-14 — End: 2012-08-16
  Administered 2012-08-14: 50 ug/min via INTRAVENOUS
  Filled 2012-08-14 (×3): qty 4

## 2012-08-14 NOTE — Progress Notes (Addendum)
ANTIBIOTIC CONSULT NOTE - INITIAL  Pharmacy Consult for Vancomycin and Fortaz Indication: cellulitis/UTI  No Known Allergies  Patient Measurements: Height: 5\' 11"  (180.3 cm) Weight: 290 lb (131.543 kg) IBW/kg (Calculated) : 75.3 Adjusted Body Weight: 100 kg  Vital Signs: Temp: 98.6 F (37 C) (06/17 2342) Temp src: Oral (06/17 2209) BP: 145/81 mmHg (06/17 2349) Pulse Rate: 119 (06/18 0105)  Labs:  Recent Labs  08/13/12 2100  WBC 6.4  HGB 10.0*  PLT 273  CREATININE 5.60*   Estimated Creatinine Clearance: 20.9 ml/min (by C-G formula based on Cr of 5.6). No results found for this basename: VANCOTROUGH, Corlis Leak, VANCORANDOM, [place], GENTPEAK, GENTRANDOM, TOBRATROUGH, TOBRAPEAK, TOBRARND, AMIKACINPEAK, AMIKACINTROU, AMIKACIN,  in the last 72 hours   Microbiology: Recent Results (from the past 720 hour(s))  URINE CULTURE     Status: None   Collection Time    08/04/12  3:32 PM      Result Value Range Status   Specimen Description URINE, CLEAN CATCH   Final   Special Requests NONE   Final   Culture  Setup Time 08/04/2012 20:12   Final   Colony Count 60,000 COLONIES/ML   Final   Culture KLEBSIELLA PNEUMONIAE   Final   Report Status 08/06/2012 FINAL   Final   Organism ID, Bacteria KLEBSIELLA PNEUMONIAE   Final  STONE ANALYSIS     Status: None   Collection Time    08/04/12  7:55 PM      Result Value Range Status   Nidus Not observed   Final   Component 1 KSTONE See Below   Final   Comment: (NOTE)     Calcium Oxalate Monohydrate ([place]) 60%     Uric Acid 40%   Stone Weight KSTONE 0.0130   Final  MRSA PCR SCREENING     Status: Abnormal   Collection Time    08/04/12 10:27 PM      Result Value Range Status   MRSA by PCR POSITIVE (*) NEGATIVE Final   Comment:            The GeneXpert MRSA Assay (FDA     approved for NASAL specimens     only), is one component of a     comprehensive MRSA colonization     surveillance program. It is not     intended to  diagnose MRSA     infection nor to guide or     monitor treatment for     MRSA infections.     RESULT CALLED TO, READ BACK BY AND VERIFIED WITH:     CLAYTON,M RN AT 0028 ON [id number] BY MCREYNOLDS,B    Medical History: Past Medical History  Diagnosis Date  . Sleep apnea     uses cpap  . Hypothyroidism   . Anemia   . Blood transfusion   . Chronic kidney disease     hx of kidney stones  . Hypertension   . Arthritis     osteoarthritis  . Pneumonia   . Diabetes mellitus     insulin dependent    Medications:  Prescriptions prior to admission  Medication Sig Dispense Refill  . cefdinir (OMNICEF) 300 MG capsule Take 1 capsule (300 mg total) by mouth 2 (two) times daily.  14 capsule  0  . cyclobenzaprine (FLEXERIL) 5 MG tablet Take 1 tablet (5 mg total) by mouth at bedtime.  30 tablet  0  . HYDROcodone-acetaminophen (NORCO/VICODIN) 5-325 MG per tablet Take 1 tablet by mouth every 6 (six)  hours as needed for pain. .  30 tablet  0  . insulin glargine (LANTUS) 100 UNIT/ML injection Inject 0.2 mLs (20 Units total) into the skin 2 (two) times daily.  10 mL  12  . oxyCODONE (OXY IR/ROXICODONE) 5 MG immediate release tablet Take 1 tablet (5 mg total) by mouth every 4 (four) hours as needed for pain.  30 tablet  0  . ALPHA LIPOIC ACID PO Take 1 tablet by mouth daily.        . Ascorbic Acid (VITAMIN C PO) Take 1 tablet by mouth daily.        . ATORVASTATIN CALCIUM PO Take 40 mg by mouth daily.       . ferrous sulfate 325 (65 FE) MG tablet Take 325 mg by mouth 2 (two) times daily.        Marland Kitchen FOLIC ACID PO Take 1 tablet by mouth daily.        Marland Kitchen GARLIC PO Take 1 tablet by mouth daily.        Marland Kitchen gemfibrozil (LOPID) 600 MG tablet Take 600 mg by mouth 2 (two) times daily before a meal.      . insulin aspart (NOVOLOG) 100 UNIT/ML injection Inject into the skin 2 (two) times daily. 100-150=3u, 151-200=4u, 201-250-7, 251-300=9, 301-350=11, over 351=14u      . levothyroxine (SYNTHROID, LEVOTHROID) 200 MCG  tablet Take 200 mcg by mouth daily.        . Multiple Vitamins-Minerals (MULTIVITAMINS THER. W/MINERALS) TABS Take 1 tablet by mouth daily.        . Omega-3 Fatty Acids (FISH OIL PO) Take 3 tablets by mouth 2 (two) times daily.        . Tamsulosin HCl (FLOMAX) 0.4 MG CAPS Take 0.4 mg by mouth daily.        Marland Kitchen VITAMIN E PO Take 1 tablet by mouth daily.        Marland Kitchen warfarin (COUMADIN) 3 MG tablet Take 3-4.5 mg by mouth daily. 3mg , 3mg , 4.5mg  then repeat       Assessment: 57 yo male with UTI/ARF for empiric antibiotics.  Vancomycin 1 g IV given in ED at 2230  Goal of Therapy:  Vancomycin trough level 10-15 mcg/ml  Plan:  Give additional vancomycin 1500 mg IV now Fortaz 1 g IV q24h F/U renal function  Abbott, Bronson Curb 08/14/2012,1:29 AM    ================================================  Addendum: patient started on CRRT.   Goal of Therapy: Vanc level: 10-15 mcg/mL   Plan: - Vanc 1250mg  IV Q24H, next dose 08/15/12 - Change Fortaz to 2gm IV Q12H - Monitor for CRRT tolerance / interruptions - F/U INR and the need for reversal, VTE px once INR < 2, start SSI      Alyaan Budzynski D. Mina Marble, PharmD, BCPS Pager:  [phone] 08/14/2012, 10:20 AM

## 2012-08-14 NOTE — Procedures (Signed)
Temporary Hemodialysis Catheter Insertion Procedure Note CONNAR UHLIG [id number] 1956-01-13  Procedure: Insertion of Temporary Hemodialysis Catheter Indications: Acute renal failure, metabolic acidosis, hyperkalemia  Procedure Details Consent: Risks of procedure as well as the alternatives and risks of each were explained to the (patient/caregiver).  Consent for procedure obtained. Time Out: Verified patient identification, verified procedure, site/side was marked, verified correct patient position, special equipment/implants available, medications/allergies/relevent history reviewed, required imaging and test results available.  Performed  Maximum sterile technique was used including antiseptics, cap, gloves, gown, hand hygiene, mask and sheet. Skin prep: Chlorhexidine; local anesthetic administered A antimicrobial bonded/coated triple lumen catheter was placed in the right internal jugular vein using the Seldinger technique.  Ultrasound was used to verify the patency of the vein and for real time needle guidance.  Evaluation Blood flow good Complications: No apparent complications Patient did tolerate procedure well. Chest X-ray ordered to verify placement.  CXR: pending.  MCQUAID, DOUGLAS 08/14/2012, 6:51 AM

## 2012-08-14 NOTE — Progress Notes (Signed)
eLink Physician-Brief Progress Note Patient Name: DUBLIN RUDY DOB: 22-Jun-1955 MRN: [id number]  Date of Service  08/14/2012   HPI/Events of Note   oozin still inr over 2  eICU Interventions  3 ffp   Intervention Category Major Interventions: Hemorrhage - evaluation and management  Raylene Miyamoto. 08/14/2012, 6:45 PM

## 2012-08-14 NOTE — Progress Notes (Signed)
PULMONARY  / CRITICAL CARE MEDICINE  Name: LEANDRA CHISUM MRN: [id number] DOB: March 06, 1955    ADMISSION DATE:  08/13/2012 CONSULTATION DATE:  08/13/2012  REFERRING MD :  Glendon Axe  PRIMARY SERVICE: PCCM  CHIEF COMPLAINT:  Flank and abdominal pain  BRIEF PATIENT DESCRIPTION:  57 yo male presented with flank pain and dysuria.  Found to have acute renal failure, hyperkalemia, anion gap metabolic acidosis, shock, and VDRF.  He had admission from 6/08 to 6/11 for nephrolithiasis, Klebsiella UTI, and renal failure.   Family reports pt was taking NSAID's for pain control.   SIGNIFICANT EVENTS: 6/17 Admit with VDRF, ARF, shock, hyperkalemia, metabolic acidosis [id number] Renal, urology consult  STUDIES:  6/17 CT abdomen> 80mm stone in left renal pelvis with mild hydronephrosis, retroperitoneal adenopathy   LINES / TUBES: 6/17 ETT >> 6/18 Rt IJ HD cath >> 6/18 Rt Radial Aline >>  CULTURES: 6/18 blood >> 6/18 urine >> 6/18 MRSA screen >> POSITIVE  ANTIBIOTICS: 6/17 vanc >> 6/17 ceftaz >>  SUBJECTIVE:  Remains on pressors.  Blood urine in foley.  VITAL SIGNS: Temp:  [97.4 F (36.3 C)-98.8 F (37.1 C)] 98.8 F (37.1 C) (06/18 0515) Pulse Rate:  [99-135] 99 (06/18 0515) Resp:  [12-31] 20 (06/18 0515) BP: (53-145)/(28-96) 89/51 mmHg (06/18 0500) SpO2:  [98 %-100 %] 100 % (06/18 0515) FiO2 (%):  [40 %] 40 % (06/18 0315) Weight:  [290 lb (131.543 kg)-297 lb (134.718 kg)] 297 lb (134.718 kg) (06/18 0500) HEMODYNAMICS:   VENTILATOR SETTINGS: Vent Mode:  [-] PRVC FiO2 (%):  [40 %] 40 % Set Rate:  [18 bmp-26 bmp] 18 bmp Vt Set:  [600 mL] 600 mL PEEP:  [5 cmH20] 5 cmH20 Plateau Pressure:  [17 cmH20-18 cmH20] 17 cmH20 INTAKE / OUTPUT: Intake/Output     06/17 0701 - 06/18 0700 06/18 0701 - 06/19 0700   I.V. (mL/kg) 656.4 (4.9)    Blood 343    NG/GT 40    IV Piggyback 500    Total Intake(mL/kg) 1539.4 (11.4)    Urine (mL/kg/hr) 595    Total Output 595     Net +944.4             PHYSICAL EXAMINATION:  Gen: no distress HEENT: ETT in place, pin-point pupils reactive PULM: decreased breath sounds, no wheeze CV: regular, no murmur AB: soft, mild tenderness with palpations over flanks b/l, decreased bowel sounds Ext: Rt lower leg valgus deformity, Lt AKA Derm: chronic venous stasis changes Rt leg Neuro: RASS -2   LABS:  Recent Labs Lab 08/13/12 2100 08/13/12 2206 08/13/12 2234 08/13/12 2237 08/14/12 0219 08/14/12 0300 08/14/12 0301 08/14/12 0350  HGB 10.0*  --   --   --   --  9.3*  --   --   WBC 6.4  --   --   --   --  7.0  --   --   PLT 273  --   --   --   --  242  --   --   NA 139  --   --   --   --   --   --   --   K 6.5*  --   --   --   --   --   --   --   CL 108  --   --   --   --   --   --   --   CO2 10*  --   --   --   --   --   --   --  GLUCOSE 115*  --   --   --   --   --   --   --   BUN 118*  --   --   --   --   --   --   --   CREATININE 5.60*  --   --   --   --   --   --   --   CALCIUM 9.5  --   --   --   --   --   --   --   AST 15  --   --   --   --   --   --   --   ALT 20  --   --   --   --   --   --   --   ALKPHOS 119*  --   --   --   --   --   --   --   BILITOT 0.2*  --   --   --   --   --   --   --   PROT 7.0  --   --   --   --   --   --   --   ALBUMIN 2.8*  --   --   --   --   --   --   --   INR  --  6.90*  --   --   --   --   --   --   LATICACIDVEN  --   --   --  0.33*  --   --  0.7  --   PROCALCITON  --   --   --   --   --   --  0.39  --   PHART  --   --  7.110*  --  7.115*  --   --  7.126*  PCO2ART  --   --  25.5*  --  25.6*  --   --  27.7*  PO2ART  --   --  91.0  --  192.0*  --   --  169.0*    Recent Labs Lab 08/07/12 1630 08/13/12 2036 08/13/12 2240 08/14/12 0014 08/14/12 0127  GLUCAP 204* 93 101* 177* 198*    Imaging: Ct Abdomen Pelvis Wo Contrast  08/13/2012   *RADIOLOGY REPORT*  Clinical Data: Abdominal pain and unable to urinate.  CT ABDOMEN AND PELVIS WITHOUT CONTRAST  Technique:  Multidetector CT  imaging of the abdomen and pelvis was performed following the standard protocol without intravenous contrast.  Comparison: 08/04/2012  Findings: Lung bases are clear.  No evidence for pneumoperitoneum.  High density structure in the gallbladder is consistent with a gallstone.  No gross abnormality to the liver, spleen or pancreas. Normal appearance of the adrenal tissue.  Again noted is bilateral perinephric edema or stranding.  There is increased dilatation and stranding around the proximal right ureter.  Again seen are stones within the right kidney.  The largest right kidney stone measures 8 mm.  No clear evidence for a right ureter stone but the distal right ureter and the ureterovesical junction are obscured by the streak artifact from the bilateral hip replacements. However, compared to the previous examination, there appears to be one less stone within the right kidney and it is possible that a small stone has migrated into the distal right ureter.  There is now a punctate stone in the left renal pelvis which is  was not present on the prior examination. There is at least mild left hydronephrosis.  There is increased edema in the retroperitoneum since the prior examination.  Again noted are small lymph nodes in the periaortic region.  The urinary bladder is decompressed.  No acute bony abnormality.  IMPRESSION: Bilateral renal calculi.   A 3 mm stone has migrated to the left renal pelvis.  There is mild left hydronephrosis.  In general, there appears to be increased edema and stranding around the ureters bilaterally.  Mild dilatation of both ureters compared with the previous examination.  Difficult to exclude distal ureter or ureterovesical stones because of artifact in the pelvis.  It is possible that a small stone has migrated from the right kidney because there appears to be one less small stone in the right kidney compared to the prior examination.  Gallstone.   Original Report Authenticated By: Markus Daft, M.D.   Dg Chest Port 1 View  08/14/2012   *RADIOLOGY REPORT*  Clinical Data: Hemodialysis catheter placement.  PORTABLE CHEST - 1 VIEW  Comparison: 08/13/2012 and 07/04/2010.  Findings: 0703 hours.  Interval right IJ dialysis catheter placement with tip at the level of the SVC right atrial junction. A nasogastric tube has been placed, projecting below the diaphragm. Endotracheal tube is unchanged within the mid trachea.  The heart size and mediastinal contours are stable.  There is stable mild atelectasis at both lung bases.  No pneumothorax or significant pleural effusion is identified.  IMPRESSION: Interval hemodialysis catheter and nasogastric tube placement without demonstrated complication.   Original Report Authenticated By: Richardean Sale, M.D.   Dg Chest Port 1 View  08/13/2012   *RADIOLOGY REPORT*  Clinical Data: Chest pain.  PORTABLE CHEST - 1 VIEW  Comparison: Chest x-ray 07/04/2010.  Findings: An endotracheal tube is in place with tip 3.2 cm above the carina. Lung volumes are low.  No definite acute consolidative airspace disease.  No definite pleural effusions.  Crowding of the pulmonary vasculature, accentuated by low lung volumes.  Mild cardiomegaly is unchanged. The patient is rotated to the right on today's exam, resulting in distortion of the mediastinal contours and reduced diagnostic sensitivity and specificity for mediastinal pathology.  IMPRESSION: 1.  Tip of endotracheal tube is 3.2 cm above the carina. 2.  Low lung volumes. 3.  Mild cardiomegaly.   Original Report Authenticated By: Vinnie Langton, M.D.     ASSESSMENT / PLAN:  PULMONARY A: Acute respiratory failure 2nd to renal failure and profound metabolic acidosis. Hx of OSA/OHS. P:   -continue full vent support -f/u CXR, ABG  CARDIOVASCULAR A: Shock likely from urosepsis. Hx of HTN, PVD. P:  -pressors to keep MAP > 65 -check cortisol -continue volume resuscitation  RENAL A: Acute on chronic renal failure  in setting of nephrolithiasis and hydronephrosis, shock with probable ATN, and NSAID use. Hyperkalemia. Hx of uric acid stones. P:   -consult urology -defer decision for renal replacement therapy to nephrology -continue HCO3 in IV fluid pending nephrology assessment -f/u ABG, BMET -monitor urine outpt -check urine eos  GASTROINTESTINAL A:  Nutrition. P:   -NPO >> add tube feeds if unable to extubate soon -PPI for SUP  HEMATOLOGIC A:  Hx of  DVT. Coagulopathy due to warfarin (likely exacerbated by antibiotic use) >> transfused 2 units FFP 6/18. Anemia of critical illness. P:  -f/u INR, CBC  INFECTIOUS A: Urosepsis. Rt leg cellulitis. P:   -D1/x vancomycin, fortaz  ENDOCRINE A:  Hx of DM2. Hx  of hypothyroidism P:   -ICU hyperglycemia protocol -continue synthroid  NEUROLOGIC A:  Acute encephalopathy in setting of narcotic use for flank pain, renal failure, acidosis. 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  Plan bicarb gtt, full vent support, monitoring for sepsis, urology consult in AM.  CC time 50 minutes.  D/w Dr. Clayborn Heron, MD [place] 08/14/2012, 8:03 AM Pager:  [phone] After 3pm call: [phone]

## 2012-08-14 NOTE — Progress Notes (Signed)
[facility] PCCM   Persistent hypotension and metabolic acidosis despite volume resuscitation and bicarb infusion.  Wife updated by phone.  Place HD cath Start neo drip Consult Renal Consult Urology > sepsis from L obstructed ureter and UTI?  Full note from partner to follow.  Jillyn Hidden PCCM Pager: [phone] Cell: [phone] If no response, call [phone]

## 2012-08-14 NOTE — Progress Notes (Signed)
Utilization review completed.  P.J. Aaleigha Bozza,RN,BSN Case Manager [phone]  

## 2012-08-14 NOTE — Progress Notes (Signed)
Chaplain Note:  Chaplain visited with pt and pt's wife.  Pt was in bed, intubated, asleep, and did not communicated during this visit.  Pt's wife was seated at bedside in support of pt.  Chaplain provided spiritual comfort and support for pt's wife.  Pt's wife expressed appreciation for chaplain support.  Chaplain will follow up as needed.  08/14/12 1200  Clinical Encounter Type  Visited With Patient and family together  Visit Type Initial;Spiritual support  Referral From Other (Comment) ([facility] referral)  Spiritual Encounters  Spiritual Needs Emotional  Stress Factors  Patient Stress Factors Health changes  Family Stress Factors Exhausted;Major life changes  Jearld Lesch, [place] [phone]

## 2012-08-14 NOTE — Consult Note (Signed)
Urology Consult   Physician requesting consult: Sood  Reason for consult: Mild hydronephrosis, elevated CR, and possible passing of kidney stone  History of Present Illness: [name] is a 57 y.o. with PMH significant for morbid obesity, DM, nephrolithiasis (uric acid), gross hematuria, and renal failure was admitted yesterday to [facility] from [facility] med center due to AKI, hyperkalemia, and metabolic acidosis.  He presented to med center yesterday with c/o flank pain, dysuria, and gross hematuria that had been present for approx 24 hours per his wife.  After presentation at med center he was thought to be lethargic and was intubated.  CT scan revealed mild left hydronephrosis, a 30mm left renal pelvis stone, multiple bilateral non obstructing renal stones, and the possibility of a small stone having migrated from the right kidney as there appeared to be one less stone in the renal pelvis when compared to previous CT scan.    Since admission to [facility] he has  remained intubated and sedated due to VDRF and is currently receiving HD per nephrology orders.  He is also being treated for right LE cellulitis that his wife states "started a day or two ago".  He has a foley in place with bright red bloody urine with clots noted.  His admission INR was 6.9 and after FFP today is 3.8.  His admission UA appears infected.  Urine culture is pending.  He is is currently receiving IV Fortaz and Vancomycin.  He also received IV Rocephin yesterday.    Pt was recently discharged from [facility] after admission for flank pain, Klebsiella UTI, and renal failure.  At that time he was treated conservatively with fluids and antibiotics.He was evaled by [name] during that hospitalization.  After d/c home his wife states that he continued to have flank pain and gross hematuria.  She also states he passed two stones after he returned home from the hospital.  Per her report he did not eat much and was taking multiple pain  medications including narcotics as well and NSAIDs.    Per Dr. Alan Ripper consult on 08/04/12: 57 year old male who was admitted with renal insufficiency. Consultation is requested for evaluation of bladder pain. The patient has seen several urologists both here and in [place] in the last year or so. 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 He has been seen in our office 2-3 times over the past month. Hematuria CT was performed due to the patient having gross hematuria. This revealed nonobstructing bilateral renal calculi, slight fullness of the right renal pelvis was some pelvic thickening, but no evident obstruction of his urinary tract. I perform cystoscopy approximately 2 weeks ago. This revealed mildly erythematous urothelium of the bladder, but no evident urethral obstruction or lesions. The patient had a significant spasm during the cystoscopy. Cytologies were unremarkable. The patient has been using approximately 5 Vicodin a day, he says mainly to help him sleep at night. He does have urinary frequency and dysuria, as well as discomfort at the end of the stream. At times, he feels like his urine is blocked. He has been on anti-spasmodic in addition to his hydrocodone. The patient was recently admitted for creatinine of approximately 2.5. Urologic consultation is requested.  He has had bilateral ureteroscopy in January by Dr. Hazle Nordmann in [facility]. No stones were encountered. Stents were left. The patient states that the stents exacerbated his urinary frequency, and did not improve his pain.  He is also seen Dr. Anthoney Harada and a physician assistant at the moment urology in [facility].  He has had no recent fever. He has intermittent gross hematuria.  And 2010, on the CT scan he had significant adenopathy. A left pelvic node was biopsied. This revealed granulomatous changes, AFB studies were negative. It was felt that this could be secondary to  the patient's orthopedic processes. This adenopathy has continued.   Past Medical History  Diagnosis Date  . Sleep apnea     uses cpap  . Hypothyroidism   . Anemia   . Blood transfusion   . Chronic kidney disease     hx of kidney stones  . Hypertension   . Arthritis     osteoarthritis  . Pneumonia   . Diabetes mellitus     insulin dependent    Past Surgical History  Procedure Laterality Date  . Bilteral hip      replacement & revison  's   . Knee surgery      left knee    . Tonsillectomy    . Amputation  02/07/2011    Procedure: AMPUTATION BELOW KNEE;  Surgeon: Newt Minion, MD;  Location: [place];  Service: Orthopedics;  Laterality: Left;  Left Below Knee Amputation    Current Hospital Medications:  Home Meds:    Medication List    ASK your doctor about these medications       ALPHA LIPOIC ACID PO  Take 1 tablet by mouth daily.     ATORVASTATIN CALCIUM PO  Take 40 mg by mouth daily.     cefdinir 300 MG capsule  Commonly known as:  OMNICEF  Take 1 capsule (300 mg total) by mouth 2 (two) times daily.     cyclobenzaprine 5 MG tablet  Commonly known as:  FLEXERIL  Take 1 tablet (5 mg total) by mouth at bedtime.     ferrous sulfate 325 (65 FE) MG tablet  Take 325 mg by mouth 2 (two) times daily.     FISH OIL PO  Take 3 tablets by mouth 2 (two) times daily.     FOLIC ACID PO  Take 1 tablet by mouth daily.     GARLIC PO  Take 1 tablet by mouth daily.     gemfibrozil 600 MG tablet  Commonly known as:  LOPID  Take 600 mg by mouth 2 (two) times daily before a meal.     HYDROcodone-acetaminophen 5-325 MG per tablet  Commonly known as:  NORCO/VICODIN  Take 1 tablet by mouth every 6 (six) hours as needed for pain. Marland Kitchen     insulin aspart 100 UNIT/ML injection  Commonly known as:  novoLOG  Inject into the skin 2 (two) times daily. 100-150=3u, 151-200=4u, 201-250-7, 251-300=9, 301-350=11, over 351=14u     insulin glargine 100 UNIT/ML injection  Commonly  known as:  LANTUS  Inject 0.2 mLs (20 Units total) into the skin 2 (two) times daily.     levothyroxine 200 MCG tablet  Commonly known as:  SYNTHROID, LEVOTHROID  Take 200 mcg by mouth daily.     multivitamins ther. w/minerals Tabs  Take 1 tablet by mouth daily.     oxyCODONE 5 MG immediate release tablet  Commonly known as:  Oxy IR/ROXICODONE  Take 1 tablet (5 mg total) by mouth every 4 (four) hours as needed for pain.     tamsulosin 0.4 MG Caps  Commonly known as:  FLOMAX  Take 0.4 mg by mouth daily.  VITAMIN C PO  Take 1 tablet by mouth daily.     VITAMIN E PO  Take 1 tablet by mouth daily.     warfarin 3 MG tablet  Commonly known as:  COUMADIN  Take 3-4.5 mg by mouth daily. 3mg , 3mg , 4.5mg  then repeat        Scheduled Meds: . cefTAZidime (FORTAZ)  IV  2 g Intravenous Q12H  . chlorhexidine  15 mL Mouth/Throat BID  . Chlorhexidine Gluconate Cloth  6 each Topical Q0600  . insulin aspart  0-9 Units Subcutaneous Q4H  . levothyroxine  200 mcg Per Tube QAC breakfast  . mupirocin ointment  1 application Nasal BID  . pantoprazole (PROTONIX) IV  40 mg Intravenous QHS  . [START ON 08/15/2012] pneumococcal 23 valent vaccine  0.5 mL Intramuscular Tomorrow-1000  . vancomycin      . [START ON 08/15/2012] vancomycin  1,250 mg Intravenous Q24H   Continuous Infusions: . sodium chloride 1,000 mL (08/13/12 2355)  . fentaNYL infusion INTRAVENOUS 50 mcg/hr (08/14/12 0800)  . phenylephrine (NEO-SYNEPHRINE) Adult infusion 50 mcg/min (08/14/12 0630)  . dialysis replacement fluid (prismasate) 300 mL/hr at 08/14/12 0855  . dialysis replacement fluid (prismasate) 200 mL/hr at 08/14/12 0855  . dialysate (PRISMASATE) 2,000 mL/hr at 08/14/12 0855  . propofol 8 mcg/kg/min (08/14/12 0930)  .  sodium bicarbonate  infusion 1000 mL 150 mL/hr at 08/14/12 0855   PRN Meds:.sodium chloride, albuterol, etomidate, fentaNYL, heparin, sodium chloride  Allergies: No Known Allergies  Family History   Problem Relation Age of Onset  . Diabetes Mother   . Colon cancer Father     Social History: Per chart history:  reports that he has never smoked. He has never used smokeless tobacco. He reports that he does not drink alcohol or use illicit drugs.  ROS: Cannot be obtained as the pt is intubated.   Physical Exam:  Vital signs in last 24 hours: Temp:  [97.4 F (36.3 C)-99.3 F (37.4 C)] 98.1 F (36.7 C) (06/18 1100) Pulse Rate:  [78-135] 78 (06/18 1100) Resp:  [12-31] 18 (06/18 1100) BP: (53-145)/(28-96) 93/53 mmHg (06/18 0800) SpO2:  [98 %-100 %] 100 % (06/18 1100) Arterial Line BP: (99-147)/(36-65) 144/50 mmHg (06/18 1100) FiO2 (%):  [40 %] 40 % (06/18 0800) Weight:  [131.543 kg (290 lb)-134.718 kg (297 lb)] 134.718 kg (297 lb) (06/18 0500) Constitutional:  Sedated and intubated; currently receiving bedside HD via right sided catheter Cardiovascular: Regular rate and rhythm Respiratory: Normal respiratory effort, Lungs clear bilaterally GI: Abdomen is soft, nondistended, no abdominal masses GU: Foley in place with bright red urine; mild scrotal edema noted; no lesions or skin breakdown noted Lymphatic: No lymphadenopathy palp  Neurologic: sedated and intubated Psychiatric: unable to assess Ext: left BKA; right LE with 1-2+ edema, venous stasis changes, mild erythema, and a dressing over a right pretibial wound  Laboratory Data:   Recent Labs  08/13/12 2100 08/14/12 0300 08/14/12 0750  WBC 6.4 7.0 5.6  HGB 10.0* 9.3* 8.3*  HCT 30.6* 28.8* 25.5*  PLT 273 242 242     Recent Labs  08/13/12 2100 08/14/12 0750  NA 139 139  K 6.5* 4.7  CL 108 110  GLUCOSE 115* 195*  BUN 118* 110*  CALCIUM 9.5 8.4  CREATININE 5.60* 4.61*     Results for orders placed during the hospital encounter of 08/13/12 (from the past 24 hour(s))  URINALYSIS, ROUTINE W REFLEX MICROSCOPIC     Status: Abnormal   Collection Time  08/13/12  8:19 PM      Result Value Range   Color, Urine  RED (*) YELLOW   APPearance TURBID (*) CLEAR   Specific Gravity, Urine 1.019  1.005 - 1.030   pH 6.5  5.0 - 8.0   Glucose, UA NEGATIVE  NEGATIVE mg/dL   Hgb urine dipstick LARGE (*) NEGATIVE   Bilirubin Urine NEGATIVE  NEGATIVE   Ketones, ur 15 (*) NEGATIVE mg/dL   Protein, ur >300 (*) NEGATIVE mg/dL   Urobilinogen, UA 0.2  0.0 - 1.0 mg/dL   Nitrite NEGATIVE  NEGATIVE   Leukocytes, UA LARGE (*) NEGATIVE  URINE MICROSCOPIC-ADD ON     Status: Abnormal   Collection Time    08/13/12  8:19 PM      Result Value Range   Squamous Epithelial / LPF FEW (*) RARE   WBC, UA TOO NUMEROUS TO COUNT  <3 WBC/hpf   RBC / HPF TOO NUMEROUS TO COUNT  <3 RBC/hpf   Bacteria, UA FEW (*) RARE  GLUCOSE, CAPILLARY     Status: None   Collection Time    08/13/12  8:36 PM      Result Value Range   Glucose-Capillary 93  70 - 99 mg/dL   Comment 1 Notify RN     Comment 2 Documented in Chart    COMPREHENSIVE METABOLIC PANEL     Status: Abnormal   Collection Time    08/13/12  9:00 PM      Result Value Range   Sodium 139  135 - 145 mEq/L   Potassium 6.5 (*) 3.5 - 5.1 mEq/L   Chloride 108  96 - 112 mEq/L   CO2 10 (*) 19 - 32 mEq/L   Glucose, Bld 115 (*) 70 - 99 mg/dL   BUN 118 (*) 6 - 23 mg/dL   Creatinine, Ser 5.60 (*) 0.50 - 1.35 mg/dL   Calcium 9.5  8.4 - 10.5 mg/dL   Total Protein 7.0  6.0 - 8.3 g/dL   Albumin 2.8 (*) 3.5 - 5.2 g/dL   AST 15  0 - 37 U/L   ALT 20  0 - 53 U/L   Alkaline Phosphatase 119 (*) 39 - 117 U/L   Total Bilirubin 0.2 (*) 0.3 - 1.2 mg/dL   GFR calc non Af Amer 10 (*) >90 mL/min   GFR calc Af Amer 12 (*) >90 mL/min  LIPASE, BLOOD     Status: None   Collection Time    08/13/12  9:00 PM      Result Value Range   Lipase 27  11 - 59 U/L  CBC WITH DIFFERENTIAL     Status: Abnormal   Collection Time    08/13/12  9:00 PM      Result Value Range   WBC 6.4  4.0 - 10.5 K/uL   RBC 3.16 (*) 4.22 - 5.81 MIL/uL   Hemoglobin 10.0 (*) 13.0 - 17.0 g/dL   HCT 30.6 (*) 39.0 - 52.0 %   MCV  96.8  78.0 - 100.0 fL   MCH 31.6  26.0 - 34.0 pg   MCHC 32.7  30.0 - 36.0 g/dL   RDW 17.8 (*) 11.5 - 15.5 %   Platelets 273  150 - 400 K/uL   Neutrophils Relative % 76  43 - 77 %   Lymphocytes Relative 11 (*) 12 - 46 %   Monocytes Relative 12  3 - 12 %   Eosinophils Relative 1  0 - 5 %  Basophils Relative 0  0 - 1 %   Neutro Abs 4.8  1.7 - 7.7 K/uL   Lymphs Abs 0.7  0.7 - 4.0 K/uL   Monocytes Absolute 0.8  0.1 - 1.0 K/uL   Eosinophils Absolute 0.1  0.0 - 0.7 K/uL   Basophils Absolute 0.0  0.0 - 0.1 K/uL   RBC Morphology POLYCHROMASIA PRESENT    PROTIME-INR     Status: Abnormal   Collection Time    08/13/12 10:06 PM      Result Value Range   Prothrombin Time 54.8 (*) 11.6 - 15.2 seconds   INR 6.90 (*) 0.00 - 1.49  POCT I-STAT 3, BLOOD GAS (G3+)     Status: Abnormal   Collection Time    08/13/12 10:34 PM      Result Value Range   pH, Arterial 7.110 (*) 7.350 - 7.450   pCO2 arterial 25.5 (*) 35.0 - 45.0 mmHg   pO2, Arterial 91.0  80.0 - 100.0 mmHg   Bicarbonate 8.2 (*) 20.0 - 24.0 mEq/L   TCO2 9  0 - 100 mmol/L   O2 Saturation 94.0     Acid-base deficit 20.0 (*) 0.0 - 2.0 mmol/L   Patient temperature 97.8 F     Collection site RADIAL, ALLEN'S TEST ACCEPTABLE     Drawn by RT     Sample type ARTERIAL     Comment NOTIFIED PHYSICIAN    CG4 I-STAT (LACTIC ACID)     Status: Abnormal   Collection Time    08/13/12 10:37 PM      Result Value Range   Lactic Acid, Venous 0.33 (*) 0.5 - 2.2 mmol/L  GLUCOSE, CAPILLARY     Status: Abnormal   Collection Time    08/13/12 10:40 PM      Result Value Range   Glucose-Capillary 101 (*) 70 - 99 mg/dL  GLUCOSE, CAPILLARY     Status: Abnormal   Collection Time    08/14/12 12:14 AM      Result Value Range   Glucose-Capillary 177 (*) 70 - 99 mg/dL  GLUCOSE, CAPILLARY     Status: Abnormal   Collection Time    08/14/12  1:27 AM      Result Value Range   Glucose-Capillary 198 (*) 70 - 99 mg/dL   Comment 1 Notify RN    MRSA PCR SCREENING      Status: Abnormal   Collection Time    08/14/12  1:28 AM      Result Value Range   MRSA by PCR POSITIVE (*) NEGATIVE  URINALYSIS, ROUTINE W REFLEX MICROSCOPIC     Status: Abnormal   Collection Time    08/14/12  1:28 AM      Result Value Range   Color, Urine RED (*) YELLOW   APPearance TURBID (*) CLEAR   Specific Gravity, Urine 1.016  1.005 - 1.030   pH 5.0  5.0 - 8.0   Glucose, UA 100 (*) NEGATIVE mg/dL   Hgb urine dipstick LARGE (*) NEGATIVE   Bilirubin Urine LARGE (*) NEGATIVE   Ketones, ur 40 (*) NEGATIVE mg/dL   Protein, ur >300 (*) NEGATIVE mg/dL   Urobilinogen, UA 1.0  0.0 - 1.0 mg/dL   Nitrite POSITIVE (*) NEGATIVE   Leukocytes, UA LARGE (*) NEGATIVE  URINE MICROSCOPIC-ADD ON     Status: None   Collection Time    08/14/12  1:28 AM      Result Value Range   Squamous Epithelial / LPF RARE  RARE   WBC, UA 11-20  <3 WBC/hpf   RBC / HPF TOO NUMEROUS TO COUNT  <3 RBC/hpf   Bacteria, UA RARE  RARE  PREPARE FRESH FROZEN PLASMA     Status: None   Collection Time    08/14/12  2:00 AM      Result Value Range   Unit Number [id number]     Blood Component Type THAWED PLASMA     Unit division 00     Status of Unit ISSUED     Transfusion Status OK TO TRANSFUSE     Unit Number [id number]     Blood Component Type THAWED PLASMA     Unit division 00     Status of Unit ISSUED     Transfusion Status OK TO TRANSFUSE    POCT I-STAT 3, BLOOD GAS (G3+)     Status: Abnormal   Collection Time    08/14/12  2:19 AM      Result Value Range   pH, Arterial 7.115 (*) 7.350 - 7.450   pCO2 arterial 25.6 (*) 35.0 - 45.0 mmHg   pO2, Arterial 192.0 (*) 80.0 - 100.0 mmHg   Bicarbonate 8.3 (*) 20.0 - 24.0 mEq/L   TCO2 9  0 - 100 mmol/L   O2 Saturation 99.0     Acid-base deficit 20.0 (*) 0.0 - 2.0 mmol/L   Patient temperature 98.3 F     Collection site RADIAL, ALLEN'S TEST ACCEPTABLE     Drawn by RT     Sample type ARTERIAL     Comment NOTIFIED PHYSICIAN    CBC     Status: Abnormal    Collection Time    08/14/12  3:00 AM      Result Value Range   WBC 7.0  4.0 - 10.5 K/uL   RBC 3.06 (*) 4.22 - 5.81 MIL/uL   Hemoglobin 9.3 (*) 13.0 - 17.0 g/dL   HCT 28.8 (*) 39.0 - 52.0 %   MCV 94.1  78.0 - 100.0 fL   MCH 30.4  26.0 - 34.0 pg   MCHC 32.3  30.0 - 36.0 g/dL   RDW 18.2 (*) 11.5 - 15.5 %   Platelets 242  150 - 400 K/uL  LACTIC ACID, PLASMA     Status: None   Collection Time    08/14/12  3:01 AM      Result Value Range   Lactic Acid, Venous 0.7  0.5 - 2.2 mmol/L  PROCALCITONIN     Status: None   Collection Time    08/14/12  3:01 AM      Result Value Range   Procalcitonin 0.39    TYPE AND SCREEN     Status: None   Collection Time    08/14/12  3:05 AM      Result Value Range   ABO/RH(D) O POS     Antibody Screen NEG     Sample Expiration 08/17/2012    BLOOD GAS, ARTERIAL     Status: Abnormal   Collection Time    08/14/12  3:50 AM      Result Value Range   FIO2 0.40     Delivery systems VENTILATOR     Mode PRESSURE REGULATED VOLUME CONTROL     VT 600     Rate 18     Peep/cpap 5.0     pH, Arterial 7.126 (*) 7.350 - 7.450   pCO2 arterial 27.7 (*) 35.0 - 45.0 mmHg   pO2, Arterial 169.0 (*) 80.0 -  100.0 mmHg   Bicarbonate 8.8 (*) 20.0 - 24.0 mEq/L   TCO2 9.6  0 - 100 mmol/L   Acid-base deficit 18.7 (*) 0.0 - 2.0 mmol/L   O2 Saturation 97.2     Patient temperature 98.6     Collection site RIGHT RADIAL     Drawn by [phone]     Sample type ARTERIAL     Allens test (pass/fail) PASS  PASS  CREATININE, URINE, RANDOM     Status: None   Collection Time    08/14/12  5:22 AM      Result Value Range   Creatinine, Urine 48.05    SODIUM, URINE, RANDOM     Status: None   Collection Time    08/14/12  5:22 AM      Result Value Range   Sodium, Ur 77    BASIC METABOLIC PANEL     Status: Abnormal   Collection Time    08/14/12  7:50 AM      Result Value Range   Sodium 139  135 - 145 mEq/L   Potassium 4.7  3.5 - 5.1 mEq/L   Chloride 110  96 - 112 mEq/L   CO2 13 (*)  19 - 32 mEq/L   Glucose, Bld 195 (*) 70 - 99 mg/dL   BUN 110 (*) 6 - 23 mg/dL   Creatinine, Ser 4.61 (*) 0.50 - 1.35 mg/dL   Calcium 8.4  8.4 - 10.5 mg/dL   GFR calc non Af Amer 13 (*) >90 mL/min   GFR calc Af Amer 15 (*) >90 mL/min  PROTIME-INR     Status: Abnormal   Collection Time    08/14/12  7:50 AM      Result Value Range   Prothrombin Time 36.3 (*) 11.6 - 15.2 seconds   INR 3.96 (*) 0.00 - 1.49  MAGNESIUM     Status: None   Collection Time    08/14/12  7:50 AM      Result Value Range   Magnesium 1.8  1.5 - 2.5 mg/dL  PHOSPHORUS     Status: Abnormal   Collection Time    08/14/12  7:50 AM      Result Value Range   Phosphorus 5.3 (*) 2.3 - 4.6 mg/dL  CBC     Status: Abnormal   Collection Time    08/14/12  7:50 AM      Result Value Range   WBC 5.6  4.0 - 10.5 K/uL   RBC 2.76 (*) 4.22 - 5.81 MIL/uL   Hemoglobin 8.3 (*) 13.0 - 17.0 g/dL   HCT 25.5 (*) 39.0 - 52.0 %   MCV 92.4  78.0 - 100.0 fL   MCH 30.1  26.0 - 34.0 pg   MCHC 32.5  30.0 - 36.0 g/dL   RDW 18.0 (*) 11.5 - 15.5 %   Platelets 242  150 - 400 K/uL  ALBUMIN     Status: Abnormal   Collection Time    08/14/12  7:50 AM      Result Value Range   Albumin 2.4 (*) 3.5 - 5.2 g/dL  LACTIC ACID, PLASMA     Status: None   Collection Time    08/14/12  7:50 AM      Result Value Range   Lactic Acid, Venous 0.6  0.5 - 2.2 mmol/L  GLUCOSE, CAPILLARY     Status: Abnormal   Collection Time    08/14/12  7:55 AM      Result Value Range  Glucose-Capillary 171 (*) 70 - 99 mg/dL  HEPATIC FUNCTION PANEL     Status: Abnormal   Collection Time    08/14/12  8:30 AM      Result Value Range   Total Protein 5.7 (*) 6.0 - 8.3 g/dL   Albumin 2.4 (*) 3.5 - 5.2 g/dL   AST 10  0 - 37 U/L   ALT 13  0 - 53 U/L   Alkaline Phosphatase 101  39 - 117 U/L   Total Bilirubin 0.2 (*) 0.3 - 1.2 mg/dL   Bilirubin, Direct 0.1  0.0 - 0.3 mg/dL   Indirect Bilirubin 0.1 (*) 0.3 - 0.9 mg/dL   Recent Results (from the past 240 hour(s))  URINE  CULTURE     Status: None   Collection Time    08/04/12  3:32 PM      Result Value Range Status   Specimen Description URINE, CLEAN CATCH   Final   Special Requests NONE   Final   Culture  Setup Time 08/04/2012 20:12   Final   Colony Count 60,000 COLONIES/ML   Final   Culture KLEBSIELLA PNEUMONIAE   Final   Report Status 08/06/2012 FINAL   Final   Organism ID, Bacteria KLEBSIELLA PNEUMONIAE   Final  STONE ANALYSIS     Status: None   Collection Time    08/04/12  7:55 PM      Result Value Range Status   Nidus Not observed   Final   Component 1 KSTONE See Below   Final   Comment: (NOTE)     Calcium Oxalate Monohydrate ([place]) 60%     Uric Acid 40%   Stone Weight KSTONE 0.0130   Final  MRSA PCR SCREENING     Status: Abnormal   Collection Time    08/04/12 10:27 PM      Result Value Range Status   MRSA by PCR POSITIVE (*) NEGATIVE Final   Comment:            The GeneXpert MRSA Assay (FDA     approved for NASAL specimens     only), is one component of a     comprehensive MRSA colonization     surveillance program. It is not     intended to diagnose MRSA     infection nor to guide or     monitor treatment for     MRSA infections.     RESULT CALLED TO, READ BACK BY AND VERIFIED WITH:     CLAYTON,M RN AT 0028 ON [id number] BY MCREYNOLDS,B  MRSA PCR SCREENING     Status: Abnormal   Collection Time    08/14/12  1:28 AM      Result Value Range Status   MRSA by PCR POSITIVE (*) NEGATIVE Final   Comment:            The GeneXpert MRSA Assay (FDA     approved for NASAL specimens     only), is one component of a     comprehensive MRSA colonization     surveillance program. It is not     intended to diagnose MRSA     infection nor to guide or     monitor treatment for     MRSA infections.     RESULT CALLED TO, READ BACK BY AND VERIFIED WITH:     CALLED TO RN Jeneen Rinks ARTIS [id number] @0420  THANEY    Renal Function:  Recent Labs  08/13/12 2100 08/14/12 0750  CREATININE 5.60*  4.61*   Estimated Creatinine Clearance: 25.7 ml/min (by C-G formula based on Cr of 4.61).  Radiologic Imaging: Ct Abdomen Pelvis Wo Contrast  08/13/2012   *RADIOLOGY REPORT*  Clinical Data: Abdominal pain and unable to urinate.  CT ABDOMEN AND PELVIS WITHOUT CONTRAST  Technique:  Multidetector CT imaging of the abdomen and pelvis was performed following the standard protocol without intravenous contrast.  Comparison: 08/04/2012  Findings: Lung bases are clear.  No evidence for pneumoperitoneum.  High density structure in the gallbladder is consistent with a gallstone.  No gross abnormality to the liver, spleen or pancreas. Normal appearance of the adrenal tissue.  Again noted is bilateral perinephric edema or stranding.  There is increased dilatation and stranding around the proximal right ureter.  Again seen are stones within the right kidney.  The largest right kidney stone measures 8 mm.  No clear evidence for a right ureter stone but the distal right ureter and the ureterovesical junction are obscured by the streak artifact from the bilateral hip replacements. However, compared to the previous examination, there appears to be one less stone within the right kidney and it is possible that a small stone has migrated into the distal right ureter.  There is now a punctate stone in the left renal pelvis which is was not present on the prior examination. There is at least mild left hydronephrosis.  There is increased edema in the retroperitoneum since the prior examination.  Again noted are small lymph nodes in the periaortic region.  The urinary bladder is decompressed.  No acute bony abnormality.  IMPRESSION: Bilateral renal calculi.   A 3 mm stone has migrated to the left renal pelvis.  There is mild left hydronephrosis.  In general, there appears to be increased edema and stranding around the ureters bilaterally.  Mild dilatation of both ureters compared with the previous examination.  Difficult to exclude  distal ureter or ureterovesical stones because of artifact in the pelvis.  It is possible that a small stone has migrated from the right kidney because there appears to be one less small stone in the right kidney compared to the prior examination.  Gallstone.   Original Report Authenticated By: Markus Daft, M.D.   Dg Chest Port 1 View  08/14/2012   *RADIOLOGY REPORT*  Clinical Data: Hemodialysis catheter placement.  PORTABLE CHEST - 1 VIEW  Comparison: 08/13/2012 and 07/04/2010.  Findings: 0703 hours.  Interval right IJ dialysis catheter placement with tip at the level of the SVC right atrial junction. A nasogastric tube has been placed, projecting below the diaphragm. Endotracheal tube is unchanged within the mid trachea.  The heart size and mediastinal contours are stable.  There is stable mild atelectasis at both lung bases.  No pneumothorax or significant pleural effusion is identified.  IMPRESSION: Interval hemodialysis catheter and nasogastric tube placement without demonstrated complication.   Original Report Authenticated By: Richardean Sale, M.D.   Dg Chest Port 1 View  08/13/2012   *RADIOLOGY REPORT*  Clinical Data: Chest pain.  PORTABLE CHEST - 1 VIEW  Comparison: Chest x-ray 07/04/2010.  Findings: An endotracheal tube is in place with tip 3.2 cm above the carina. Lung volumes are low.  No definite acute consolidative airspace disease.  No definite pleural effusions.  Crowding of the pulmonary vasculature, accentuated by low lung volumes.  Mild cardiomegaly is unchanged. The patient is rotated to the right on today's exam, resulting in distortion of the mediastinal contours and reduced diagnostic sensitivity and specificity  for mediastinal pathology.  IMPRESSION: 1.  Tip of endotracheal tube is 3.2 cm above the carina. 2.  Low lung volumes. 3.  Mild cardiomegaly.   Original Report Authenticated By: Vinnie Langton, M.D.    Impression/Recommendation Pt may have recently passed a right sided kidney  stone.  Ct scan does not reveal right sided hydro or obstruction and this does not require intervention.  He has known [place] with a negative outpt eval in the recent past.  A passed stone as well his supra-therapeutic INR would contribute to persistent hematuria. Recommend continuation of foley with irrigation prn if necessary due to clots until pt has recovered from all other current medical issues.    Mild left sided hydronephrosis--this does not require intervention at this time and is not likely contributing a great deal to his renal insufficiency.  Recommend repeat imaging (either RUS or repeat non contrasted CT) in 1-2 days to re-eval.  If hydro has progressed and his INR has normalized recommend left side perc tube placement by IR.    Continue ABx and f/u urine culture for directed tx.  His most recent culture was sensitive to quinolones.    I have reviewed Mr. Nitzsche case and imaging studies. I think, based on his history prior to admission and scans that he has recently passed a stone from his right side. He has minimal hydro bilaterally which may be due to obstruction, but left hydro may be due to known left reflux. He is making urine. At this point, I would rely on dialysis to treat ARF. If suspicion of obstruction persists, I would recommend perc. drainage of kidneys if INR normalizes. Will follow closely. Marcie Bal 08/14/2012, 11:36 AM

## 2012-08-14 NOTE — H&P (Signed)
PULMONARY  / CRITICAL CARE MEDICINE  Name: [name] MRN: [id number] DOB: [birth year]    ADMISSION DATE:  08/13/2012 CONSULTATION DATE:  08/13/2012  REFERRING MD :  Glendon Axe  PRIMARY SERVICE: PCCM  CHIEF COMPLAINT:  Flank and abdominal pain  BRIEF PATIENT DESCRIPTION: 57 y/o male with multiple medical problems including nephrolithiasis was admitted on 6/18 from [facility] due to flank pain and dysuria.  He was found to have AKI, hyperkalemia and a metabolic acidosis.    SIGNIFICANT EVENTS / STUDIES:  6/17 CT abdomen> 63mm stone in left renal pelvis with mild hydronephrosis, retroperitoneal adenopathy   LINES / TUBES: 6/17 ETT >>  CULTURES: 6/18 blood >> 6/17 urine >>  ANTIBIOTICS: 6/17 vanc >> 6/17 ceftaz >>  HISTORY OF PRESENT ILLNESS:  57 y/o male with multiple medical problems including nephrolithiasis was admitted on 6/18 from [facility] due to flank pain and dysuria.  He was found to have AKI, hyperkalemia and a metabolic acidosis.   He is unable to provide history because of intubation.  By chart review: he presented to the [facility] ED on 6/17 with complaints of flank pain and dysuria.  He had been recently discharged after admission for flank pain and renal failure.  During that admission he was treated conservatively with fluids, and antibiotics for a pan sensitive UTI.  He was noted to have stones with no obstruction and urology followed him as an inpatient.   After discharge home on 6/11, his wife stated that he had persistent flank pain and hematuria.  He did not eat much and he took multiple pain meds. She is not certain, but she believes that he may have taken Aleve in addition to his narcotics.  PAST MEDICAL HISTORY :  Past Medical History  Diagnosis Date  . Sleep apnea     uses cpap  . Hypothyroidism   . Anemia   . Blood transfusion   . Chronic kidney disease     hx of kidney stones  . Hypertension   . Arthritis    osteoarthritis  . Pneumonia   . Diabetes mellitus     insulin dependent   Past Surgical History  Procedure Laterality Date  . Bilteral hip      replacement & revison  's   . Knee surgery      left knee    . Tonsillectomy    . Amputation  02/07/2011    Procedure: AMPUTATION BELOW KNEE;  Surgeon: Newt Minion, MD;  Location: [facility];  Service: Orthopedics;  Laterality: Left;  Left Below Knee Amputation   Prior to Admission medications   Medication Sig Start Date End Date Taking? Authorizing Provider  cefdinir (OMNICEF) 300 MG capsule Take 1 capsule (300 mg total) by mouth 2 (two) times daily. 08/07/12  Yes Velvet Bathe, MD  cyclobenzaprine (FLEXERIL) 5 MG tablet Take 1 tablet (5 mg total) by mouth at bedtime. 08/07/12  Yes Velvet Bathe, MD  HYDROcodone-acetaminophen (NORCO/VICODIN) 5-325 MG per tablet Take 1 tablet by mouth every 6 (six) hours as needed for pain. . 08/07/12  Yes Velvet Bathe, MD  insulin glargine (LANTUS) 100 UNIT/ML injection Inject 0.2 mLs (20 Units total) into the skin 2 (two) times daily. 08/07/12  Yes Velvet Bathe, MD  oxyCODONE (OXY IR/ROXICODONE) 5 MG immediate release tablet Take 1 tablet (5 mg total) by mouth every 4 (four) hours as needed for pain. 08/07/12  Yes Velvet Bathe, MD  ALPHA LIPOIC ACID  PO Take 1 tablet by mouth daily.      Historical Provider, MD  Ascorbic Acid (VITAMIN C PO) Take 1 tablet by mouth daily.      Historical Provider, MD  ATORVASTATIN CALCIUM PO Take 40 mg by mouth daily.     Historical Provider, MD  ferrous sulfate 325 (65 FE) MG tablet Take 325 mg by mouth 2 (two) times daily.      Historical Provider, MD  FOLIC ACID PO Take 1 tablet by mouth daily.      Historical Provider, MD  GARLIC PO Take 1 tablet by mouth daily.      Historical Provider, MD  gemfibrozil (LOPID) 600 MG tablet Take 600 mg by mouth 2 (two) times daily before a meal.    Historical Provider, MD  insulin aspart (NOVOLOG) 100 UNIT/ML injection Inject into the skin 2 (two)  times daily. 100-150=3u, 151-200=4u, 201-250-7, 251-300=9, 301-350=11, over 351=14u    Historical Provider, MD  levothyroxine (SYNTHROID, LEVOTHROID) 200 MCG tablet Take 200 mcg by mouth daily.      Historical Provider, MD  Multiple Vitamins-Minerals (MULTIVITAMINS THER. W/MINERALS) TABS Take 1 tablet by mouth daily.      Historical Provider, MD  Omega-3 Fatty Acids (FISH OIL PO) Take 3 tablets by mouth 2 (two) times daily.      Historical Provider, MD  Tamsulosin HCl (FLOMAX) 0.4 MG CAPS Take 0.4 mg by mouth daily.      Historical Provider, MD  VITAMIN E PO Take 1 tablet by mouth daily.      Historical Provider, MD  warfarin (COUMADIN) 3 MG tablet Take 3-4.5 mg by mouth daily. 3mg , 3mg , 4.5mg  then repeat    Historical Provider, MD   No Known Allergies  FAMILY HISTORY:  Family History  Problem Relation Age of Onset  . Diabetes Mother   . Colon cancer Father    SOCIAL HISTORY:  reports that he has never smoked. He has never used smokeless tobacco. He reports that he does not drink alcohol or use illicit drugs.  REVIEW OF SYSTEMS:  Cannot obtain due to intubation  SUBJECTIVE:   VITAL SIGNS: Temp:  [97.4 F (36.3 C)-98.6 F (37 C)] 98.5 F (36.9 C) (06/18 0130) Pulse Rate:  [102-135] 111 (06/18 0130) Resp:  [12-31] 21 (06/18 0130) BP: (70-145)/(39-96) 70/39 mmHg (06/18 0130) SpO2:  [98 %-100 %] 100 % (06/18 0130) FiO2 (%):  [40 %] 40 % (06/18 0105) Weight:  [131.543 kg (290 lb)] 131.543 kg (290 lb) (06/17 2017) HEMODYNAMICS:   VENTILATOR SETTINGS: Vent Mode:  [-] PRVC FiO2 (%):  [40 %] 40 % Set Rate:  [26 bmp] 26 bmp Vt Set:  [600 mL] 600 mL PEEP:  [5 cmH20] 5 cmH20 Plateau Pressure:  [17 cmH20-18 cmH20] 18 cmH20 INTAKE / OUTPUT: Intake/Output     06/17 0701 - 06/18 0700   I.V. (mL/kg) 171.9 (1.3)   Total Intake(mL/kg) 171.9 (1.3)   Net +171.9         PHYSICAL EXAMINATION:  Gen: morbidly obese, no acute distress HEENT: NCAT, PERRL, EOMi, OP clear, neck supple  without masses PULM: Rhonchi bilaterally CV: RRR, distant heart sounds, no JVD AB: BS+, soft, nontender, no hsm Ext: warm, edema L leg, no clubbing, no cyanosis; s/p L BKA Derm: ulceration, desquamation and erythema over an area of chronic venous stasis changes over his R leg (shin) Neuro: sedated on vent   LABS:  Recent Labs Lab 08/07/12 0503 08/13/12 2100 08/13/12 2206 08/13/12 2234 08/13/12 2237  HGB 9.6* 10.0*  --   --   --   WBC 5.4 6.4  --   --   --   PLT 183 273  --   --   --   NA 134* 139  --   --   --   K 4.1 6.5*  --   --   --   CL 106 108  --   --   --   CO2 17* 10*  --   --   --   GLUCOSE 205* 115*  --   --   --   BUN 41* 118*  --   --   --   CREATININE 1.81* 5.60*  --   --   --   CALCIUM 9.2 9.5  --   --   --   AST  --  15  --   --   --   ALT  --  20  --   --   --   ALKPHOS  --  119*  --   --   --   BILITOT  --  0.2*  --   --   --   PROT  --  7.0  --   --   --   ALBUMIN  --  2.8*  --   --   --   INR 1.95*  --  6.90*  --   --   LATICACIDVEN  --   --   --   --  0.33*  PHART  --   --   --  7.110*  --   PCO2ART  --   --   --  25.5*  --   PO2ART  --   --   --  91.0  --     Recent Labs Lab 08/07/12 1630 08/13/12 2036 08/13/12 2240 08/14/12 0014 08/14/12 0127  GLUCAP 204* 93 101* 177* 198*    CXR: low lung volumes, no infiltrate, ETT in place EKG: pending  ASSESSMENT / PLAN:  PULMONARY A:Acute respiratory failure in the setting of acute renal failure> intubated for airway protection primarily OSA/Obesity hypoventilation syndrome P:   -full vent support -repeat ABG now -prn bronchodilators -WUA/SBT daily  CARDIOVASCULAR A: Baseline hypertension, now mild hypotension likely related to propofol vs acidemia (has been normotensive most of hospitalization); doubt septic shock give no fever, normal WBC  P:  -EKG now -continue IVF -tele -  RENAL A:   AKI > oliguric, mild hydronephrosis on left, doubt this is culprit; I am more worried about  pre-renal vs NSAID induced nephropathy Metabolic acidosis from renal failure Hyperkalemia, s/p D50, insulin, and calcium gluconate Nephrolithiasis > mild hydro left kidney P:    -check Ur Cr/Na -check urine eos -continue bicarb gtt -repeat ABG -12 lead now -kayexelate now -repeat BMET  -may need renal consult -will need urology consult, currently no urgent need for percutaneous drain as not septic and INR elevated  GASTROINTESTINAL A:  No acute issues P:   -OG tube -PPI for SUP  HEMATOLOGIC A:  Coagulopathy due to warfarin (likely exacerbated by antibiotic use) P:  -correct with FFP for possible procedures (CVL, urology procedure?)  INFECTIOUS A:  UTI currently being treated, some stranding around ureters but no clear radiographic or clinical evidence of pyelonephritis at this point Cellulitis R leg P:   -change antibiotics to Vanc (cellulitis) and Ceftaz (UTI/Cellulitis) -monitor for sepsis  ENDOCRINE A:  DM2 and hypothyroidism P:   -ICU hyperglycemia protocol -continue synthroid  NEUROLOGIC A:  Acute encephalopathy in setting of narcotic use for flank pain P:   -supportive care with vent -propofol/fentanyl as needed for comfort/RASS -1  TODAY'S SUMMARY: 57 yo male with AKI in setting of stones, mild hydronephrosis left kidney; suspect pre-renal and/or NSAID nephropathy at play.  Plan bicarb gtt, full vent support, monitoring for sepsis, urology consult in AM.  Code status: per wife, patient is full code  I updated his wife at the bedside.  I have personally obtained a history, examined the patient, evaluated laboratory and imaging results, formulated the assessment and plan and placed orders. CRITICAL CARE: The patient is critically ill with multiple organ systems failure and requires high complexity decision making for assessment and support, frequent evaluation and titration of therapies, application of advanced monitoring technologies and extensive  interpretation of multiple databases. Critical Care Time devoted to patient care services described in this note is 60 minutes.   Lemmie Evens [facility] and [place] Pager: [phone]  08/14/2012, 1:39 AM

## 2012-08-14 NOTE — Consult Note (Addendum)
Requesting Physician:  Dr. Chase Caller Reason for Consult:  Acute renal failure, metabolic acidosis, hyperkalemia   HPI: The patient is a 57 y.o. year-old WM with a background of IDDM, hypertension, morbid obesity, hypothyroidism, and known stone disease. He was just discharged from [facility] 6/11 after presenting with dysuria, frequency, AKI (peak creatinine was 2.6, 1.8 at discharge on 6/11) and hyperkalemia, with the discharge summary indicating a combination of stones, ACE, volume depletion at that time, although hydro not noted on CT then.  He was seen by urology at that time with an extensive urologic history - excerpted from Dr. Alan Ripper note of 6/8 is as follows "The patient has seen several urologists both here and in [place] in the last year or so. 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 He has been seen in our office 2-3 times over the past month. Hematuria CT was performed due to the patient having gross hematuria. This revealed nonobstructing bilateral renal calculi, slight fullness of the right renal pelvis was some pelvic thickening, but no evident obstruction of his urinary tract. I perform cystoscopy approximately 2 weeks ago. This revealed mildly erythematous urothelium of the bladder, but no evident urethral obstruction or lesions. The patient had a significant spasm during the cystoscopy. Cytologies were unremarkable.  He has had I. lateral ureteroscopy in January by Dr. Emi Belfast in [facility]. Stents were left. The patient states that the stents exacerbated his urinary frequency, and did not improve his pain. He is also seen Dr. Anthoney Harada and a physician assistant at the moment urology in [facility]."  He presented to [facility] by report with flank pain, hematuria and  inability to void.  He was found to be in significant renal falure, hyperkalemic and with with severe metabolic acidosis (123456).  Was said to be  lethargic, and was intubated.  A CT shows some mild hydro on the left, and the right ureter not well seen but question of one less stone on the right than on CT last admit so query of stone in the right ureter that could be causing obstruction on that side as well.  He is making bloody urine via foley, but because of his renal failure and hyperkalemia, and because his acidosis has not responded to isotonic sodium bicarbonate drip, we are asked to provide CRRT.  He is on a small dose of phenylephrine at this time.    No family present.   Cannot verify whether or not he resumed ACE inhibitor after past admission (was discontinued last admit)    Creatinine, Ser  Date/Time Value Range Status  08/13/2012  9:00 PM 5.60* 0.50 - 1.35 mg/dL Final  08/07/2012  5:03 AM 1.81* 0.50 - 1.35 mg/dL Final  08/06/2012  4:45 AM 2.07* 0.50 - 1.35 mg/dL Final  08/05/2012  1:07 PM 2.25* 0.50 - 1.35 mg/dL Final  08/05/2012  1:15 AM 2.33* 0.50 - 1.35 mg/dL Final  08/04/2012 10:00 PM 2.36* 0.50 - 1.35 mg/dL Final  08/04/2012  4:41 PM 2.35* 0.50 - 1.35 mg/dL Final  08/04/2012  4:26 PM 2.60* 0.50 - 1.35 mg/dL Final  02/10/2011  6:23 AM 1.23  0.50 - 1.35 mg/dL Final  02/09/2011  6:25 AM 1.19  0.50 - 1.35 mg/dL Final  02/08/2011  5:30 AM 1.41* 0.50 - 1.35 mg/dL Final  02/07/2011  5:05 AM 1.62* 0.50 - 1.35 mg/dL Final  02/06/2011  6:20 AM 1.81* 0.50 - 1.35 mg/dL Final  02/05/2011  6:00 AM 1.68* 0.50 - 1.35 mg/dL Final  02/04/2011  6:00 AM 1.70* 0.50 - 1.35 mg/dL Final  02/03/2011  6:15 AM 1.64* 0.50 - 1.35 mg/dL Final  02/01/2011  5:35 PM 1.90* 0.50 - 1.35 mg/dL Final  07/06/2010  3:55 AM 1.57* 0.4 - 1.5 mg/dL Final  07/05/2010  5:52 AM 1.66* 0.4 - 1.5 mg/dL Final  07/04/2010  2:27 AM 1.60* 0.4 - 1.5 mg/dL Final  06/13/2010  5:00 AM 1.24  0.4 - 1.5 mg/dL Final  06/11/2010  8:20 AM 1.16  0.4 - 1.5 mg/dL Final  06/10/2010  4:36 PM 1.4  0.4 - 1.5 mg/dL Final  06/06/2010  4:45 AM 1.32  0.4 - 1.5 mg/dL Final  06/03/2010  5:43 AM 1.37  0.4 - 1.5  mg/dL Final  04/29/2010  5:05 AM 1.11  0.4 - 1.5 mg/dL Final  04/28/2010  5:00 AM 1.19  0.4 - 1.5 mg/dL Final  04/27/2010  3:21 AM 1.42  0.4 - 1.5 mg/dL Final  04/25/2010  4:17 PM 1.14  0.4 - 1.5 mg/dL Final  03/08/2010  7:05 AM 1.35  0.4 - 1.5 mg/dL Final  03/07/2010  6:15 AM 1.38  0.4 - 1.5 mg/dL Final  03/06/2010  4:55 AM 1.39  0.4 - 1.5 mg/dL Final  03/05/2010  5:00 AM 1.25  0.4 - 1.5 mg/dL Final  03/03/2010 11:30 AM 1.08  0.4 - 1.5 mg/dL Final  2020/09/709  5:00 AM 1.30  0.4 - 1.5 mg/dL Final  12/09/2009  4:20 AM 1.46  0.4 - 1.5 mg/dL Final  12/08/2009  7:20 AM 1.47  0.4 - 1.5 mg/dL Final  12/06/2009  5:00 AM 1.32  0.4 - 1.5 mg/dL Final  12/05/2009  6:55 AM 1.25  0.4 - 1.5 mg/dL Final  12/04/2009  5:25 AM 1.29  0.4 - 1.5 mg/dL Final  12/02/2009  3:51 PM 1.28  0.4 - 1.5 mg/dL Final  09/24/2009  5:00 AM 1.77* 0.4 - 1.5 mg/dL Final  09/23/2009  5:25 AM 1.63* 0.4 - 1.5 mg/dL Final  09/22/2009  5:26 AM 1.93* 0.4 - 1.5 mg/dL Final  09/21/2009 10:10 PM 2.07* 0.4 - 1.5 mg/dL Final  12/21/2008  5:20 AM 0.96  0.4 - 1.5 mg/dL Final  12/20/2008  5:23 AM 0.95  0.4 - 1.5 mg/dL Final  12/19/2008  4:25 AM 1.05  0.4 - 1.5 mg/dL Final  12/18/2008  9:21 AM 1.00  0.4 - 1.5 mg/dL Final  11/27/2008  6:10 AM 1.11  0.4 - 1.5 mg/dL Final  11/26/2008  4:50 AM 1.11  0.4 - 1.5 mg/dL Final  11/25/2008  7:45 PM 1.35  0.4 - 1.5 mg/dL Final    Past Medical History:  Past Medical History  Diagnosis Date  . Sleep apnea     uses cpap  . Hypothyroidism   . Anemia   . Blood transfusion   . Chronic kidney disease     hx of kidney stones  . Hypertension   . Arthritis     osteoarthritis  . Pneumonia   . Diabetes mellitus     insulin dependent    Past Surgical History:  Past Surgical History  Procedure Laterality Date  . Bilteral hip      replacement & revison  's   . Knee surgery      left knee    . Tonsillectomy    . Amputation  02/07/2011    Procedure: AMPUTATION BELOW KNEE;  Surgeon: Newt Minion, MD;  Location: [facility];  Service:  Orthopedics;  Laterality: Left;  Left Below Knee Amputation    Family History:  Family History  Problem Relation Age of Onset  . Diabetes Mother   . Colon cancer Father    Social History:  reports that he has never smoked. He has never used smokeless tobacco. He reports that he does not drink alcohol or use illicit drugs.  Allergies: No Known Allergies  Home medications: Prior to Admission medications   Medication Sig Start Date End Date Taking? Authorizing Provider  cefdinir (OMNICEF) 300 MG capsule Take 1 capsule (300 mg total) by mouth 2 (two) times daily. 08/07/12  Yes Velvet Bathe, MD  cyclobenzaprine (FLEXERIL) 5 MG tablet Take 1 tablet (5 mg total) by mouth at bedtime. 08/07/12  Yes Velvet Bathe, MD  HYDROcodone-acetaminophen (NORCO/VICODIN) 5-325 MG per tablet Take 1 tablet by mouth every 6 (six) hours as needed for pain. . 08/07/12  Yes Velvet Bathe, MD  insulin glargine (LANTUS) 100 UNIT/ML injection Inject 0.2 mLs (20 Units total) into the skin 2 (two) times daily. 08/07/12  Yes Velvet Bathe, MD  oxyCODONE (OXY IR/ROXICODONE) 5 MG immediate release tablet Take 1 tablet (5 mg total) by mouth every 4 (four) hours as needed for pain. 08/07/12  Yes Velvet Bathe, MD  ALPHA LIPOIC ACID PO Take 1 tablet by mouth daily.      Historical Provider, MD  Ascorbic Acid (VITAMIN C PO) Take 1 tablet by mouth daily.      Historical Provider, MD  ATORVASTATIN CALCIUM PO Take 40 mg by mouth daily.     Historical Provider, MD  ferrous sulfate 325 (65 FE) MG tablet Take 325 mg by mouth 2 (two) times daily.      Historical Provider, MD  FOLIC ACID PO Take 1 tablet by mouth daily.      Historical Provider, MD  GARLIC PO Take 1 tablet by mouth daily.      Historical Provider, MD  gemfibrozil (LOPID) 600 MG tablet Take 600 mg by mouth 2 (two) times daily before a meal.    Historical Provider, MD  insulin aspart (NOVOLOG) 100 UNIT/ML injection Inject into the skin 2 (two) times daily.  100-150=3u, 151-200=4u, 201-250-7, 251-300=9, 301-350=11, over 351=14u    Historical Provider, MD  levothyroxine (SYNTHROID, LEVOTHROID) 200 MCG tablet Take 200 mcg by mouth daily.      Historical Provider, MD  Multiple Vitamins-Minerals (MULTIVITAMINS THER. W/MINERALS) TABS Take 1 tablet by mouth daily.      Historical Provider, MD  Omega-3 Fatty Acids (FISH OIL PO) Take 3 tablets by mouth 2 (two) times daily.      Historical Provider, MD  Tamsulosin HCl (FLOMAX) 0.4 MG CAPS Take 0.4 mg by mouth daily.      Historical Provider, MD  VITAMIN E PO Take 1 tablet by mouth daily.      Historical Provider, MD  warfarin (COUMADIN) 3 MG tablet Take 3-4.5 mg by mouth daily. 3mg , 3mg , 4.5mg  then repeat    Historical Provider, MD    Inpatient medications: . cefTAZidime (FORTAZ)  IV  1 g Intravenous Q24H  . chlorhexidine  15 mL Mouth/Throat BID  . chlorhexidine      . Chlorhexidine Gluconate Cloth  6 each Topical Q0600  . levothyroxine  200 mcg Per Tube QAC breakfast  . lidocaine (cardiac) 100 mg/78ml      . mupirocin ointment  1 application Nasal BID  . pantoprazole (PROTONIX) IV  40 mg Intravenous QHS  . [START ON 08/15/2012] pneumococcal 23 valent  vaccine  0.5 mL Intramuscular Tomorrow-1000  . succinylcholine      . vancomycin       . sodium chloride 1,000 mL (08/13/12 2355)  . fentaNYL infusion INTRAVENOUS 50 mcg/hr (08/14/12 0800)  . phenylephrine (NEO-SYNEPHRINE) Adult infusion 50 mcg/min (08/14/12 0630)  . dialysis replacement fluid (prismasate)    . dialysis replacement fluid (prismasate)    . dialysate (PRISMASATE)    . propofol 10 mcg/kg/min (08/14/12 0230)  .  sodium bicarbonate  infusion 1000 mL 150 mL/hr at 08/14/12 0342   Review of Systems Unobtainable as patient is on the ventilator  Physical Exam:  Blood pressure 91/53, pulse 95, temperature 99.3 F (37.4 C), temperature source Oral, resp. rate 18, height 5\' 11"  (1.803 m), weight 134.718 kg (297 lb), SpO2 100.00%.  Gen:  Morbidly obese bearded WM  Intubated Right IJ HD cath oozing Skin: no rash  Neck: Cannot see neck veins Chest: Anteriorly clear Heart: regular, no rub or gallop Abdomen: soft, obese, diffusely tender to palpation Ext: Left BKA with 2+ edema RLE with discoloration changes knee to ankle, skin tear ant tib region, 2+edema Right UE art line Neuro: Intubated, sedated Foley draining grossly bloody urine with some clots   Recent Labs Lab 08/13/12 2100  NA 139  K 6.5*  CL 108  CO2 10*  GLUCOSE 115*  BUN 118*  CREATININE 5.60*  CALCIUM 9.5    Recent Labs Lab 08/13/12 2100  AST 15  ALT 20  ALKPHOS 119*  BILITOT 0.2*  PROT 7.0  ALBUMIN 2.8*    Recent Labs Lab 08/13/12 2100  LIPASE 27    Recent Labs Lab 08/13/12 2100 08/14/12 0300  WBC 6.4 7.0  NEUTROABS 4.8  --   HGB 10.0* 9.3*  HCT 30.6* 28.8*  MCV 96.8 94.1  PLT 273 242   Lab Results  Component Value Date   INR 3.96* 08/14/2012   INR 6.90* 08/13/2012   INR 1.95* 08/07/2012   ABG    Component Value Date/Time   PHART 7.126* 08/14/2012 0350   PCO2ART 27.7* 08/14/2012 0350   PO2ART 169.0* 08/14/2012 0350   HCO3 8.8* 08/14/2012 0350   TCO2 9.6 08/14/2012 0350   ACIDBASEDEF 18.7* 08/14/2012 0350   O2SAT 97.2 08/14/2012 0350     Recent Labs Lab 08/13/12 2036 08/13/12 2240 08/14/12 0014 08/14/12 0127 08/14/12 0755  GLUCAP 93 101* 177* 198* 171*   Xrays/Other Studies: Ct Abdomen Pelvis Wo Contrast  08/13/2012   *RADIOLOGY REPORT*  Clinical Data: Abdominal pain and unable to urinate.  CT ABDOMEN AND PELVIS WITHOUT CONTRAST  Technique:  Multidetector CT imaging of the abdomen and pelvis was performed following the standard protocol without intravenous contrast.  Comparison: 08/04/2012  Findings: Lung bases are clear.  No evidence for pneumoperitoneum.  High density structure in the gallbladder is consistent with a gallstone.  No gross abnormality to the liver, spleen or pancreas. Normal appearance of the adrenal  tissue.  Again noted is bilateral perinephric edema or stranding.  There is increased dilatation and stranding around the proximal right ureter.  Again seen are stones within the right kidney.  The largest right kidney stone measures 8 mm.  No clear evidence for a right ureter stone but the distal right ureter and the ureterovesical junction are obscured by the streak artifact from the bilateral hip replacements. However, compared to the previous examination, there appears to be one less stone within the right kidney and it is possible that a small stone has migrated into  the distal right ureter.  There is now a punctate stone in the left renal pelvis which is was not present on the prior examination. There is at least mild left hydronephrosis.  There is increased edema in the retroperitoneum since the prior examination.  Again noted are small lymph nodes in the periaortic region.  The urinary bladder is decompressed.  No acute bony abnormality.  IMPRESSION: Bilateral renal calculi.   A 3 mm stone has migrated to the left renal pelvis.  There is mild left hydronephrosis.  In general, there appears to be increased edema and stranding around the ureters bilaterally.  Mild dilatation of both ureters compared with the previous examination.  Difficult to exclude distal ureter or ureterovesical stones because of artifact in the pelvis.  It is possible that a small stone has migrated from the right kidney because there appears to be one less small stone in the right kidney compared to the prior examination.  Gallstone.   Original Report Authenticated By: Markus Daft, M.D.   Dg Chest Port 1 View  08/14/2012   *RADIOLOGY REPORT*  Clinical Data: Hemodialysis catheter placement.  PORTABLE CHEST - 1 VIEW  Comparison: 08/13/2012 and 07/04/2010.  Findings: 0703 hours.  Interval right IJ dialysis catheter placement with tip at the level of the SVC right atrial junction. A nasogastric tube has been placed, projecting below the  diaphragm. Endotracheal tube is unchanged within the mid trachea.  The heart size and mediastinal contours are stable.  There is stable mild atelectasis at both lung bases.  No pneumothorax or significant pleural effusion is identified.  IMPRESSION: Interval hemodialysis catheter and nasogastric tube placement without demonstrated complication.   Original Report Authenticated By: Richardean Sale, M.D.   Dg Chest Port 1 View  08/13/2012   *RADIOLOGY REPORT*  Clinical Data: Chest pain.  PORTABLE CHEST - 1 VIEW  Comparison: Chest x-ray 07/04/2010.  Findings: An endotracheal tube is in place with tip 3.2 cm above the carina. Lung volumes are low.  No definite acute consolidative airspace disease.  No definite pleural effusions.  Crowding of the pulmonary vasculature, accentuated by low lung volumes.  Mild cardiomegaly is unchanged. The patient is rotated to the right on today's exam, resulting in distortion of the mediastinal contours and reduced diagnostic sensitivity and specificity for mediastinal pathology.  IMPRESSION: 1.  Tip of endotracheal tube is 3.2 cm above the carina. 2.  Low lung volumes. 3.  Mild cardiomegaly.   Original Report Authenticated By: Vinnie Langton, M.D.   Impression/Plan 57 y.o. year-old WM with a background of IDDM, hypertension, morbid obesity, hypothyroidism, and known stone disease, who presents with (by report) flank pain, gross hematuria (with supratherapeutic INR), acute kidney injury, hyperkalemia and anion gap metabolic acidosis.  CT raises question of obstruction with changes not present on CT last admission.  Renal replacement therapy is required to assist in correction of his profound metabolic acidosis and hyperkalemia, as well as correction of his renal failure.    AKI ? Obstruction (at least partial) with changes not present on last CT last admission. IS making some urine.  Needs re-eval by urology ([name] follows) Will require CRRT for correction of acidosis  and hyperkalemia Cannot verify whether or not he stopped his ACE inhibitor (was taking prior to last admission) Since BP soft and on phenylephrine will go with CRRT 4K fluids and continue the peripheral bicarb drip  Anion gap acidosis Likely d/t renal failure Cannot exclude lactic (lactate pending) Continue isotonic bicarbonate and will  add CRRT as above  Hyperkalemia CRRT/correct of acidosis  Uric acid stone disease with possible obstruction Contact urology (done by primary service)  Other issues per primary service Thanks for the consult Will follow closely  Jamal Maes,  MD [place] [phone] pager 08/14/2012, 8:05 AM

## 2012-08-15 ENCOUNTER — Inpatient Hospital Stay (HOSPITAL_COMMUNITY): Payer: Medicare Other

## 2012-08-15 DIAGNOSIS — D62 Acute posthemorrhagic anemia: Secondary | ICD-10-CM

## 2012-08-15 LAB — RENAL FUNCTION PANEL
BUN: 45 mg/dL — ABNORMAL HIGH (ref 6–23)
CO2: 27 mEq/L (ref 19–32)
Calcium: 8.4 mg/dL (ref 8.4–10.5)
Chloride: 104 mEq/L (ref 96–112)
Creatinine, Ser: 1.78 mg/dL — ABNORMAL HIGH (ref 0.50–1.35)
GFR calc non Af Amer: 42 mL/min — ABNORMAL LOW (ref >90)

## 2012-08-15 LAB — POCT I-STAT 3, ART BLOOD GAS (G3+)
Acid-Base Excess: 5 mmol/L — ABNORMAL HIGH (ref 0.0–2.0)
Acid-Base Excess: 3 mmol/L — ABNORMAL HIGH (ref 0.0–2.0)
Bicarbonate: 28.5 mEq/L — ABNORMAL HIGH (ref 20.0–24.0)
O2 Saturation: 100 %
O2 Saturation: 96 %
Patient temperature: 98.9
Patient temperature: 37.8
TCO2: 30 mmol/L (ref 0–100)
pCO2 arterial: 37.3 mmHg (ref 35.0–45.0)
pCO2 arterial: 40 mmHg (ref 35.0–45.0)
pH, Arterial: 7.521 — ABNORMAL HIGH (ref 7.350–7.450)
pH, Arterial: 7.437 (ref 7.350–7.450)
pO2, Arterial: 120 mmHg — ABNORMAL HIGH (ref 80.0–100.0)

## 2012-08-15 LAB — BLOOD GAS, ARTERIAL
Acid-Base Excess: 4.6 mmol/L — ABNORMAL HIGH (ref 0.0–2.0)
Drawn by: 34779
MECHVT: 600 mL
O2 Content: 96.9 L/min
O2 Saturation: 96.9 %
Patient temperature: 99
RATE: 18 resp/min
TCO2: 28.4 mmol/L (ref 0–100)

## 2012-08-15 LAB — PROTIME-INR
INR: 1.72 — ABNORMAL HIGH (ref 0.00–1.49)
Prothrombin Time: 19.6 seconds — ABNORMAL HIGH (ref 11.6–15.2)

## 2012-08-15 LAB — GLUCOSE, CAPILLARY
Glucose-Capillary: 144 mg/dL — ABNORMAL HIGH (ref 70–99)
Glucose-Capillary: 180 mg/dL — ABNORMAL HIGH (ref 70–99)

## 2012-08-15 LAB — CBC
HCT: 22 % — ABNORMAL LOW (ref 39.0–52.0)
Hemoglobin: 7.5 g/dL — ABNORMAL LOW (ref 13.0–17.0)
MCHC: 34.1 g/dL (ref 30.0–36.0)
RBC: 2.45 MIL/uL — ABNORMAL LOW (ref 4.22–5.81)
WBC: 5 10*3/uL (ref 4.0–10.5)

## 2012-08-15 LAB — PREPARE FRESH FROZEN PLASMA
Unit division: 0
Unit division: 0
Unit division: 0
Unit division: 0

## 2012-08-15 LAB — MAGNESIUM: Magnesium: 2 mg/dL (ref 1.5–2.5)

## 2012-08-15 LAB — IRON AND TIBC
Iron: 25 ug/dL — ABNORMAL LOW (ref 42–135)
Saturation Ratios: 18 % — ABNORMAL LOW (ref 20–55)
UIBC: 112 ug/dL — ABNORMAL LOW (ref 125–400)

## 2012-08-15 MED ORDER — ONDANSETRON HCL 4 MG/2ML IJ SOLN: 4.0000 mg | Freq: Four times a day (QID) | INTRAMUSCULAR | Status: DC | PRN

## 2012-08-15 MED ORDER — FENTANYL CITRATE 0.05 MG/ML IJ SOLN
25.0000 ug | INTRAMUSCULAR | Status: DC | PRN
  Administered 2012-08-15: 50 ug via INTRAVENOUS
  Administered 2012-08-15: 100 ug via INTRAVENOUS
  Administered 2012-08-15 – 2012-08-16 (×3): 50 ug via INTRAVENOUS
  Administered 2012-08-16 (×4): 100 ug via INTRAVENOUS
  Administered 2012-08-17: 50 ug via INTRAVENOUS
  Administered 2012-08-17: 100 ug via INTRAVENOUS
  Filled 2012-08-15 (×10): qty 2

## 2012-08-15 MED ORDER — VANCOMYCIN HCL 10 G IV SOLR
1750.0000 mg | INTRAVENOUS | Status: DC
  Administered 2012-08-16 – 2012-08-18 (×3): 1750 mg via INTRAVENOUS
  Filled 2012-08-15 (×4): qty 1750

## 2012-08-15 MED ORDER — POTASSIUM PHOSPHATE DIBASIC 3 MMOLE/ML IV SOLN
20.0000 mmol | Freq: Once | INTRAVENOUS | Status: AC
  Administered 2012-08-15: 20 mmol via INTRAVENOUS
  Filled 2012-08-15: qty 6.67

## 2012-08-15 MED ORDER — INSULIN ASPART 100 UNIT/ML ~~LOC~~ SOLN
0.0000 [IU] | Freq: Three times a day (TID) | SUBCUTANEOUS | Status: DC
  Administered 2012-08-15 – 2012-08-16 (×3): 2 [IU] via SUBCUTANEOUS
  Administered 2012-08-16 – 2012-08-18 (×6): 3 [IU] via SUBCUTANEOUS

## 2012-08-15 MED ORDER — ACETAMINOPHEN 325 MG PO TABS
650.0000 mg | ORAL_TABLET | Freq: Four times a day (QID) | ORAL | Status: DC | PRN
  Administered 2012-08-17 – 2012-08-21 (×2): 650 mg via ORAL
  Filled 2012-08-15 (×3): qty 2

## 2012-08-15 MED ORDER — ALBUTEROL SULFATE (5 MG/ML) 0.5% IN NEBU: 2.5000 mg | INHALATION_SOLUTION | RESPIRATORY_TRACT | Status: DC | PRN

## 2012-08-15 MED ORDER — FENTANYL BOLUS VIA INFUSION
25.0000 ug | INTRAVENOUS | Status: DC | PRN
  Filled 2012-08-15: qty 100

## 2012-08-15 MED ORDER — LEVOTHYROXINE SODIUM 200 MCG PO TABS
200.0000 ug | ORAL_TABLET | Freq: Every day | ORAL | Status: DC
  Administered 2012-08-16 – 2012-08-21 (×6): 200 ug via ORAL
  Filled 2012-08-15 (×8): qty 1

## 2012-08-15 NOTE — Progress Notes (Signed)
eLink Physician-Brief Progress Note Patient Name: [name] DOB: 03/30/1955 MRN: [id number]  Date of Service  08/15/2012   HPI/Events of Note   abg with resp alk with ph 7.5 and pco2 35. Patient is making urine and is on CVVH. Currently on bicarbonate infusion at 150 cc per hour   eICU Interventions   stopped bicarbonate Check ABG in one hour    Intervention Category Major Interventions: Acid-Base disturbance - evaluation and management;Acute renal failure - evaluation and management  Nayely Dingus 08/15/2012, 3:59 AM

## 2012-08-15 NOTE — Progress Notes (Signed)
PULMONARY  / CRITICAL CARE MEDICINE  Name: JONANTHONY LIBERTINI MRN: [id number] DOB: 1956-02-06    ADMISSION DATE:  08/13/2012 CONSULTATION DATE:  08/13/2012  REFERRING MD :  Glendon Axe  PRIMARY SERVICE: PCCM  CHIEF COMPLAINT:  Flank and abdominal pain  BRIEF PATIENT DESCRIPTION:  57 yo male presented with flank pain and dysuria.  Found to have acute renal failure, hyperkalemia, anion gap metabolic acidosis, shock, and VDRF.  He had admission from 6/08 to 6/11 for nephrolithiasis, Klebsiella UTI, and renal failure.   Family reports pt was taking NSAID's for pain control.   SIGNIFICANT EVENTS: 6/17 Admit with VDRF, ARF, shock, hyperkalemia, metabolic acidosis [id number] Renal, urology consult, CRRT started  STUDIES:  6/17 CT abdomen> 29mm stone in left renal pelvis with mild hydronephrosis, retroperitoneal adenopathy   LINES / TUBES: 6/17 ETT >> 6/19 6/18 Rt IJ HD cath >> 6/18 Rt Radial Aline >>  CULTURES: 6/18 blood >> 6/18 urine >> 6/18 MRSA screen >> POSITIVE  ANTIBIOTICS: 6/17 vanc >> 6/17 ceftaz >>  SUBJECTIVE:  Off pressors.  Tolerating pressure support.  Remains on CRRT >> making urine.  Off HCO3 in IV fluid.    VITAL SIGNS: Temp:  [96.8 F (36 C)-99.1 F (37.3 C)] 98.8 F (37.1 C) (06/19 0700) Pulse Rate:  [76-170] 170 (06/19 0700) Resp:  [6-24] 15 (06/19 0700) BP: (108-121)/(42-47) 121/47 mmHg (06/19 0350) SpO2:  [95 %-100 %] 100 % (06/19 0700) Arterial Line BP: (68-152)/(34-63) 104/40 mmHg (06/19 0700) FiO2 (%):  [40 %] 40 % (06/19 0802) Weight:  [304 lb 7.3 oz (138.1 kg)] 304 lb 7.3 oz (138.1 kg) (06/19 0500) HEMODYNAMICS:   VENTILATOR SETTINGS: Vent Mode:  [-] PSV FiO2 (%):  [40 %] 40 % Set Rate:  [18 bmp] 18 bmp Vt Set:  [600 mL] 600 mL PEEP:  [5 cmH20] 5 cmH20 Pressure Support:  [5 cmH20] 5 cmH20 Plateau Pressure:  [10 cmH20-17 cmH20] 15 cmH20 INTAKE / OUTPUT: Intake/Output     06/18 0701 - 06/19 0700 06/19 0701 - 06/20 0700   I.V. (mL/kg) 4130.8 (29.9) 39.7  (0.3)   Blood 990.5    NG/GT 150    IV Piggyback 449.7    Total Intake(mL/kg) 5721 (41.4) 39.7 (0.3)   Urine (mL/kg/hr) 4025 (1.2) 90 (0.4)   Other 3450 (1) 20 (0.1)   Total Output 7475 110   Net -1754 -70.3        Stool Occurrence 4 x      PHYSICAL EXAMINATION:  Gen: no distress HEENT: ETT in place PULM: decreased breath sounds, no wheeze CV: regular, no murmur AB: soft, non tender, decreased bowel sounds Ext: Rt lower leg valgus deformity, Lt AKA Derm: chronic venous stasis changes Rt leg Neuro: RASS 0, follows commands, moves extremities   LABS:  Recent Labs Lab 08/13/12 2100 08/13/12 2206  08/13/12 2237  08/14/12 0300 08/14/12 0301  08/14/12 0750 08/14/12 0830  08/14/12 1431 08/14/12 1500 08/15/12 0350 08/15/12 0352 08/15/12 0500  HGB 10.0*  --   --   --   --  9.3*  --   --  8.3*  --   --   --   --  7.5*  --   --   WBC 6.4  --   --   --   --  7.0  --   --  5.6  --   --   --   --  5.0  --   --   PLT 273  --   --   --   --  242  --   --  242  --   --   --  212 168  --   --   NA 139  --   --   --   --   --   --   --  139  --   --   --  139 143  --   --   K 6.5*  --   --   --   --   --   --   --  4.7  --   --   --  3.8 3.0*  --   --   CL 108  --   --   --   --   --   --   --  110  --   --   --  106 104  --   --   CO2 10*  --   --   --   --   --   --   --  13*  --   --   --  19 27  --   --   GLUCOSE 115*  --   --   --   --   --   --   --  195*  --   --   --  195* 167*  --   --   BUN 118*  --   --   --   --   --   --   --  110*  --   --   --  83* 45*  --   --   CREATININE 5.60*  --   --   --   --   --   --   --  4.61*  --   --   --  3.23* 1.78*  --   --   CALCIUM 9.5  --   --   --   --   --   --   --  8.4  --   --   --  8.2* 8.4  --   --   MG  --   --   --   --   --   --   --   --  1.8  --   --   --   --  2.0  --   --   PHOS  --   --   --   --   --   --   --   --  5.3*  --   --   --  3.5 1.4*  --   --   AST 15  --   --   --   --   --   --   --   --  10  --   --    --   --   --   --   ALT 20  --   --   --   --   --   --   --   --  13  --   --   --   --   --   --   ALKPHOS 119*  --   --   --   --   --   --   --   --  101  --   --   --   --   --   --   BILITOT 0.2*  --   --   --   --   --   --   --   --  0.2*  --   --   --   --   --   --   PROT 7.0  --   --   --   --   --   --   --   --  5.7*  --   --   --   --   --   --   ALBUMIN 2.8*  --   --   --   --   --   --   --  2.4* 2.4*  --   --  2.4* 2.4*  --   --   APTT  --   --   --   --   --   --   --   --   --   --   --   --  71*  --   --   --   INR  --  6.90*  --   --   --   --   --   --  3.96*  --   --   --  2.84*  --   --   --   LATICACIDVEN  --   --   --  0.33*  --   --  0.7  --  0.6  --   --   --   --   --   --   --   PROCALCITON  --   --   --   --   --   --  0.39  --   --   --   --   --   --   --   --   --   PHART  --   --   < >  --   < >  --   --   < >  --   --   < > 7.399  --   --  7.521* 7.533*  PCO2ART  --   --   < >  --   < >  --   --   < >  --   --   < > 32.5*  --   --  35.0 32.8*  PO2ART  --   --   < >  --   < >  --   --   < >  --   --   < > 185.0*  --   --  159.0* 185.0*  < > = values in this interval not displayed.  Recent Labs Lab 08/14/12 1121 08/14/12 1538 08/14/12 2017 08/14/12 2345 08/15/12 0434  GLUCAP 181* 160* 166* 158* 144*    Imaging: Ct Abdomen Pelvis Wo Contrast  08/13/2012   *RADIOLOGY REPORT*  Clinical Data: Abdominal pain and unable to urinate.  CT ABDOMEN AND PELVIS WITHOUT CONTRAST  Technique:  Multidetector CT imaging of the abdomen and pelvis was performed following the standard protocol without intravenous contrast.  Comparison: 08/04/2012  Findings: Lung bases are clear.  No evidence for pneumoperitoneum.  High density structure in the gallbladder is consistent with a gallstone.  No gross abnormality to the liver, spleen or pancreas. Normal appearance of the adrenal tissue.  Again noted is bilateral perinephric edema or stranding.  There is increased dilatation and  stranding around the proximal right ureter.  Again seen are stones within the right kidney.  The largest right kidney stone measures 8 mm.  No clear evidence for a right ureter stone but the distal right ureter and  the ureterovesical junction are obscured by the streak artifact from the bilateral hip replacements. However, compared to the previous examination, there appears to be one less stone within the right kidney and it is possible that a small stone has migrated into the distal right ureter.  There is now a punctate stone in the left renal pelvis which is was not present on the prior examination. There is at least mild left hydronephrosis.  There is increased edema in the retroperitoneum since the prior examination.  Again noted are small lymph nodes in the periaortic region.  The urinary bladder is decompressed.  No acute bony abnormality.  IMPRESSION: Bilateral renal calculi.   A 3 mm stone has migrated to the left renal pelvis.  There is mild left hydronephrosis.  In general, there appears to be increased edema and stranding around the ureters bilaterally.  Mild dilatation of both ureters compared with the previous examination.  Difficult to exclude distal ureter or ureterovesical stones because of artifact in the pelvis.  It is possible that a small stone has migrated from the right kidney because there appears to be one less small stone in the right kidney compared to the prior examination.  Gallstone.   Original Report Authenticated By: Markus Daft, M.D.   Dg Chest Port 1 View  08/15/2012   *RADIOLOGY REPORT*  Clinical Data: Acute respiratory failure.  Intubation.  PORTABLE CHEST - 1 VIEW  Comparison: 08/14/2012  Findings: NG tube tip is in the distal esophagus and needs be advanced.  Endotracheal tube is in good position.  Central venous catheter in good position, unchanged.  The pulmonary vascularity is normal.  Minimal atelectasis and peribronchial thickening at the right base.  IMPRESSION: Slight  right base atelectasis.  NG tube needs to be advanced.   Original Report Authenticated By: Lorriane Shire, M.D.   Dg Chest Port 1 View  08/14/2012   *RADIOLOGY REPORT*  Clinical Data: Hemodialysis catheter placement.  PORTABLE CHEST - 1 VIEW  Comparison: 08/13/2012 and 07/04/2010.  Findings: 0703 hours.  Interval right IJ dialysis catheter placement with tip at the level of the SVC right atrial junction. A nasogastric tube has been placed, projecting below the diaphragm. Endotracheal tube is unchanged within the mid trachea.  The heart size and mediastinal contours are stable.  There is stable mild atelectasis at both lung bases.  No pneumothorax or significant pleural effusion is identified.  IMPRESSION: Interval hemodialysis catheter and nasogastric tube placement without demonstrated complication.   Original Report Authenticated By: Richardean Sale, M.D.   Dg Chest Port 1 View  08/13/2012   *RADIOLOGY REPORT*  Clinical Data: Chest pain.  PORTABLE CHEST - 1 VIEW  Comparison: Chest x-ray 07/04/2010.  Findings: An endotracheal tube is in place with tip 3.2 cm above the carina. Lung volumes are low.  No definite acute consolidative airspace disease.  No definite pleural effusions.  Crowding of the pulmonary vasculature, accentuated by low lung volumes.  Mild cardiomegaly is unchanged. The patient is rotated to the right on today's exam, resulting in distortion of the mediastinal contours and reduced diagnostic sensitivity and specificity for mediastinal pathology.  IMPRESSION: 1.  Tip of endotracheal tube is 3.2 cm above the carina. 2.  Low lung volumes. 3.  Mild cardiomegaly.   Original Report Authenticated By: Vinnie Langton, M.D.     ASSESSMENT / PLAN:  PULMONARY A: Acute respiratory failure 2nd to renal failure and profound metabolic acidosis. Hx of OSA/OHS. P:   -proceed with extubation 6/19 -f/u CXR  intermittently  CARDIOVASCULAR A: Shock likely from urosepsis >> off pressors 6/18. Hx of  HTN, PVD. P:  -even fluid balance  RENAL A: Acute on chronic renal failure in setting of nephrolithiasis and hydronephrosis, shock with probable ATN, and NSAID use. Hyperkalemia. Hx of uric acid stones >> no acute interventions per urology. P:   -CRRT per renal >> ? If we can transition off this soon -f/u ABG, BMET -monitor urine outpt -check urine eos  GASTROINTESTINAL A:  Nutrition. P:   -advance diet after extubation -PPI for SUP  HEMATOLOGIC A:  Hx of  DVT. Coagulopathy due to warfarin (likely exacerbated by antibiotic use) >> transfused 5 units FFP 6/18. Anemia of critical illness. P:  -f/u INR, CBC  INFECTIOUS A: Urosepsis. Rt leg cellulitis. P:   -D2/x vancomycin, fortaz  ENDOCRINE A:  Hx of DM2. Hx of hypothyroidism P:   -SSI -continue synthroid  NEUROLOGIC A:  Acute encephalopathy in setting of narcotic use for flank pain, renal failure, acidosis >> resolved 6/19. P:   -prn tylenol, fentanyl for pain control  CC time 35 minutes.  Chesley Mires, MD [facility] Pulmonary/Critical Care 08/15/2012, 8:32 AM Pager:  [phone] After 3pm call: [phone]

## 2012-08-15 NOTE — Procedures (Signed)
Extubation Procedure Note  Patient Details:   Name: [name] DOB: [birth year] MRN: [id number]   Airway Documentation:  Patient weaned successfully on pressure support mode with settings of 40%/5/5. Cuff leak performed and patient extubated. Patient is now wearing nasal cannula with oxygen running at 4lpm.   Evaluation  O2 sats: stable throughout Complications: No apparent complications Patient did tolerate procedure well. Bilateral Breath Sounds: Clear;Diminished Suctioning: Airway Yes  [name] 08/15/2012, 8:47 AM

## 2012-08-15 NOTE — Progress Notes (Signed)
ANTIBIOTIC CONSULT NOTE - Follow-up  Pharmacy Consult for Vancomycin and Fortaz Indication: cellulitis/UTI  No Known Allergies  Patient Measurements: Height: 5\' 11"  (180.3 cm) Weight: 304 lb 7.3 oz (138.1 kg) IBW/kg (Calculated) : 75.3 Adjusted Body Weight: 100 kg  Vital Signs: Temp: 99.4 F (37.4 C) (06/19 1100) Temp src: Core (Comment) (06/19 0800) BP: 121/47 mmHg (06/19 0350) Pulse Rate: 101 (06/19 1100)  Labs:  Recent Labs  08/14/12 0300 08/14/12 0522 08/14/12 0750 08/14/12 1500 08/15/12 0350  WBC 7.0  --  5.6  --  5.0  HGB 9.3*  --  8.3*  --  7.5*  PLT 242  --  242 212 168  LABCREA  --  48.05  --   --   --   CREATININE  --   --  4.61* 3.23* 1.78*   Estimated Creatinine Clearance: 67.4 ml/min (by C-G formula based on Cr of 1.78). No results found for this basename: VANCOTROUGH, VANCOPEAK, VANCORANDOM, GENTTROUGH, GENTPEAK, GENTRANDOM, TOBRATROUGH, TOBRAPEAK, TOBRARND, AMIKACINPEAK, AMIKACINTROU, AMIKACIN,  in the last 72 hours   Assessment: 57 yo male with UTI/ARF receiving empiric vancomycin and fortaz.  CRRT ran 6/18-6/19 off this am. SCr 1.8 this am>>CrCl ~65 ml/min based on calculations, UOP 1.2 ml/kg/hr.  Actual renal function difficult to interpret as patient recent transitioned off CRRT-- will use CrCl 30-50 ml/min as estimate.  Last vanc dose was 1250 mg this am at 0300, fortaz 2g received at 0500 this am.  Vanc 6/17 >> Tressie Ellis 6/17 >>  6/18 blood x 2 - pending  6/18 urine - NEG 6/18 MRSA PCR - positive  Goal of Therapy:  Vancomycin trough level 10-15 mcg/ml  Plan:  - Change Vancomycin to 1750 mg IV q24h - Continue Fortaz 2g IV q12h - Follow up SCr, UOP, cultures, clinical course and adjust as clinically indicated.   Shalaya Swailes L. Amada Jupiter, PharmD, [place] Clinical Pharmacist Pager: [phone] Pharmacy: [phone] 08/15/2012 11:29 AM

## 2012-08-15 NOTE — Progress Notes (Signed)
Subjective: Patient reports that he is feeling better. He is not having a significant amount of pain present time. He is depressed.  Objective: Vital signs in last 24 hours: Temp:  [97.4 F (36.3 C)-99.9 F (37.7 C)] 99.9 F (37.7 C) (06/19 1300) Pulse Rate:  [76-170] 103 (06/19 1300) Resp:  [6-24] 24 (06/19 1300) BP: (108-121)/(42-47) 121/47 mmHg (06/19 0350) SpO2:  [98 %-100 %] 98 % (06/19 1300) Arterial Line BP: (68-134)/(37-63) 115/50 mmHg (06/19 1300) FiO2 (%):  [40 %] 40 % (06/19 0802) Weight:  [304 lb 7.3 oz (138.1 kg)] 304 lb 7.3 oz (138.1 kg) (06/19 0500)  Intake/Output from previous day: 06/18 0701 - 06/19 0700 In: 5721 [I.V.:4130.8; Blood:990.5; NG/GT:150; IV Piggyback:449.7] Out: [id number] [Urine:4025] Intake/Output this shift: Total I/O In: 622.7 [P.O.:240; I.V.:104.7; NG/GT:60; IV Piggyback:218] Out: 31 [Urine:540; Other:66]  Physical Exam:  Constitutional: Vital signs reviewed. WD WN in NAD   Eyes: PERRL, No scleral icterus.    Significant lower extremity edema noted.  Urine is pain, but clearer than yesterday. There are no clots.   Lab Results:  Recent Labs  08/14/12 0300 08/14/12 0750 08/15/12 0350  HGB 9.3* 8.3* 7.5*  HCT 28.8* 25.5* 22.0*   BMET  Recent Labs  08/14/12 1500 08/15/12 0350  NA 139 143  K 3.8 3.0*  CL 106 104  CO2 19 27  GLUCOSE 195* 167*  BUN 83* 45*  CREATININE 3.23* 1.78*  CALCIUM 8.2* 8.4    Recent Labs  08/14/12 0750 08/14/12 1500 08/15/12 1541  INR 3.96* 2.84* 1.72*   No results found for this basename: LABURIN,  in the last 72 hours Results for orders placed during the hospital encounter of 08/13/12  URINE CULTURE     Status: None   Collection Time    08/13/12  8:19 PM      Result Value Range Status   Specimen Description URINE, CATHETERIZED   Final   Special Requests NONE   Final   Culture  Setup Time 08/14/2012 00:59   Final   Colony Count NO GROWTH   Final   Culture NO GROWTH   Final   Report Status  08/14/2012 FINAL   Final  MRSA PCR SCREENING     Status: Abnormal   Collection Time    08/14/12  1:28 AM      Result Value Range Status   MRSA by PCR POSITIVE (*) NEGATIVE Final   Comment:            The GeneXpert MRSA Assay (FDA     approved for NASAL specimens     only), is one component of a     comprehensive MRSA colonization     surveillance program. It is not     intended to diagnose MRSA     infection nor to guide or     monitor treatment for     MRSA infections.     RESULT CALLED TO, READ BACK BY AND VERIFIED WITH:     CALLED TO RN JAMES ARTIS [phone] @0420  THANEY  CULTURE, BLOOD (ROUTINE X 2)     Status: None   Collection Time    08/14/12  2:50 AM      Result Value Range Status   Specimen Description BLOOD RIGHT HAND   Final   Special Requests BOTTLES DRAWN AEROBIC ONLY [facility]   Final   Culture  Setup Time 08/14/2012 10:52   Final   Culture     Final   Value:  BLOOD CULTURE RECEIVED NO GROWTH TO DATE CULTURE WILL BE HELD FOR 5 DAYS BEFORE ISSUING A FINAL NEGATIVE REPORT   Report Status PENDING   Incomplete  CULTURE, BLOOD (ROUTINE X 2)     Status: None   Collection Time    08/14/12  3:05 AM      Result Value Range Status   Specimen Description BLOOD RIGHT ARM   Final   Special Requests BOTTLES DRAWN AEROBIC ONLY 10CC   Final   Culture  Setup Time 08/14/2012 10:50   Final   Culture     Final   Value:        BLOOD CULTURE RECEIVED NO GROWTH TO DATE CULTURE WILL BE HELD FOR 5 DAYS BEFORE ISSUING A FINAL NEGATIVE REPORT   Report Status PENDING   Incomplete    Studies/Results: Ct Abdomen Pelvis Wo Contrast  08/13/2012   *RADIOLOGY REPORT*  Clinical Data: Abdominal pain and unable to urinate.  CT ABDOMEN AND PELVIS WITHOUT CONTRAST  Technique:  Multidetector CT imaging of the abdomen and pelvis was performed following the standard protocol without intravenous contrast.  Comparison: 08/04/2012  Findings: Lung bases are clear.  No evidence for pneumoperitoneum.  High  density structure in the gallbladder is consistent with a gallstone.  No gross abnormality to the liver, spleen or pancreas. Normal appearance of the adrenal tissue.  Again noted is bilateral perinephric edema or stranding.  There is increased dilatation and stranding around the proximal right ureter.  Again seen are stones within the right kidney.  The largest right kidney stone measures 8 mm.  No clear evidence for a right ureter stone but the distal right ureter and the ureterovesical junction are obscured by the streak artifact from the bilateral hip replacements. However, compared to the previous examination, there appears to be one less stone within the right kidney and it is possible that a small stone has migrated into the distal right ureter.  There is now a punctate stone in the left renal pelvis which is was not present on the prior examination. There is at least mild left hydronephrosis.  There is increased edema in the retroperitoneum since the prior examination.  Again noted are small lymph nodes in the periaortic region.  The urinary bladder is decompressed.  No acute bony abnormality.  IMPRESSION: Bilateral renal calculi.   A 3 mm stone has migrated to the left renal pelvis.  There is mild left hydronephrosis.  In general, there appears to be increased edema and stranding around the ureters bilaterally.  Mild dilatation of both ureters compared with the previous examination.  Difficult to exclude distal ureter or ureterovesical stones because of artifact in the pelvis.  It is possible that a small stone has migrated from the right kidney because there appears to be one less small stone in the right kidney compared to the prior examination.  Gallstone.   Original Report Authenticated By: Markus Daft, M.D.   Dg Chest Port 1 View  08/15/2012   *RADIOLOGY REPORT*  Clinical Data: Acute respiratory failure.  Intubation.  PORTABLE CHEST - 1 VIEW  Comparison: 08/14/2012  Findings: NG tube tip is in the  distal esophagus and needs be advanced.  Endotracheal tube is in good position.  Central venous catheter in good position, unchanged.  The pulmonary vascularity is normal.  Minimal atelectasis and peribronchial thickening at the right base.  IMPRESSION: Slight right base atelectasis.  NG tube needs to be advanced.   Original Report Authenticated By: Lorriane Shire,  M.D.   Dg Chest Port 1 View  08/14/2012   *RADIOLOGY REPORT*  Clinical Data: Hemodialysis catheter placement.  PORTABLE CHEST - 1 VIEW  Comparison: 08/13/2012 and 07/04/2010.  Findings: 0703 hours.  Interval right IJ dialysis catheter placement with tip at the level of the SVC right atrial junction. A nasogastric tube has been placed, projecting below the diaphragm. Endotracheal tube is unchanged within the mid trachea.  The heart size and mediastinal contours are stable.  There is stable mild atelectasis at both lung bases.  No pneumothorax or significant pleural effusion is identified.  IMPRESSION: Interval hemodialysis catheter and nasogastric tube placement without demonstrated complication.   Original Report Authenticated By: Richardean Sale, M.D.   Dg Chest Port 1 View  08/13/2012   *RADIOLOGY REPORT*  Clinical Data: Chest pain.  PORTABLE CHEST - 1 VIEW  Comparison: Chest x-ray 07/04/2010.  Findings: An endotracheal tube is in place with tip 3.2 cm above the carina. Lung volumes are low.  No definite acute consolidative airspace disease.  No definite pleural effusions.  Crowding of the pulmonary vasculature, accentuated by low lung volumes.  Mild cardiomegaly is unchanged. The patient is rotated to the right on today's exam, resulting in distortion of the mediastinal contours and reduced diagnostic sensitivity and specificity for mediastinal pathology.  IMPRESSION: 1.  Tip of endotracheal tube is 3.2 cm above the carina. 2.  Low lung volumes. 3.  Mild cardiomegaly.   Original Report Authenticated By: Vinnie Langton, M.D.     Assessment/Plan:   1. History of urolithiasis. He has passed stones in the past few days. There is a small left renal pelvic stone that I do not think is obstructing, there is or possibly has been a right distal ureteral stone. There is minimal hydronephrosis on most recent scan. He is making adequate urine at the present time.    2. Gross hematuria. This has been chronic over the past few months, whether he has or has not had any stones. There is no evidence of neoplasm of the kidneys. Recent urinary cytology was negative.    3. Renal insufficiency. This may be due to urinary obstruction, but the patient has multiple other medical issues which no doubt are playing a part    I think it worthwhile at this point to repeat a noncontrasted scan tomorrow. He has had quite a few CT scans recently, but unfortunately he has a large body habitus, radio lucent stones, and this is the best method of imaging these. If he does have evidence of hydronephrosis or significant ureteral calculus, I would consider at this point percutaneous drainage of the involved renal unit the patient's coagulation status is normal. Long-term, he needs anesthetic cystoscopy, retrograde ureteropyelograms, possible bladder biopsy, and because he may have interstitial cystitis, possible hydrodistention. I have ordered a CT scan for tomorrow. I will not be available tomorrow, but I will have our weekend coverage see Mr. Tozer.   LOS: 2 days   Franchot Gallo M 08/15/2012, 5:47 PM

## 2012-08-15 NOTE — Progress Notes (Signed)
Subjective:  Awake, wants tube out Off pressors Alkalosis now and bicarb off Making urine, still bloody but lightening up   Objective Vital signs in last 24 hours: Filed Vitals:   08/15/12 0500 08/15/12 0600 08/15/12 0700 08/15/12 0800  BP:      Pulse: 87 90 170   Temp: 99 F (37.2 C) 98.9 F (37.2 C) 98.8 F (37.1 C)   TempSrc:    Core (Comment)  Resp: 17 19 15    Height:      Weight: 138.1 kg (304 lb 7.3 oz)     SpO2: 100% 100% 100%    Weight change: 6.557 kg (14 lb 7.3 oz)  Intake/Output Summary (Last 24 hours) at 08/15/12 0906 Last data filed at 08/15/12 0800  Gross per 24 hour  Intake 5310.46 ml  Output   6960 ml  Net -1649.54 ml   Physical Exam: BP 121/47  Pulse 170  Temp(Src) 98.8 F (37.1 C) (Core (Comment))  Resp 15  Ht 5\' 11"  (1.803 m)  Wt 138.1 kg (304 lb 7.3 oz)  BMI 42.48 kg/m2  SpO2 100% Obese WM NAD Wants ETT out Lungs ant clear Obese abdomen, not particularly tender 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 +1 edema Urine in foley bloody with clots but clearer than yesterday  Labs: Basic Metabolic Panel:  Recent Labs Lab 08/13/12 2100 08/14/12 0750 08/14/12 1500 08/15/12 0350  NA 139 139 139 143  K 6.5* 4.7 3.8 3.0*  CL 108 110 106 104  CO2 10* 13* 19 27  GLUCOSE 115* 195* 195* 167*  BUN 118* 110* 83* 45*  CREATININE 5.60* 4.61* 3.23* 1.78*  CALCIUM 9.5 8.4 8.2* 8.4  PHOS  --  5.3* 3.5 1.4*   Liver Function Tests:  Recent Labs Lab 08/13/12 2100  08/14/12 0830 08/14/12 1500 08/15/12 0350  AST 15  --  10  --   --   ALT 20  --  13  --   --   ALKPHOS 119*  --  101  --   --   BILITOT 0.2*  --  0.2*  --   --   PROT 7.0  --  5.7*  --   --   ALBUMIN 2.8*  < > 2.4* 2.4* 2.4*  < > = values in this interval not displayed.  Recent Labs Lab 08/13/12 2100  LIPASE 27   No results found for this basename: AMMONIA,  in the last 168 hours CBC:  Recent Labs Lab 08/13/12 2100 08/14/12 0300 08/14/12 0750 08/14/12 1500  08/15/12 0350  WBC 6.4 7.0 5.6  --  5.0  NEUTROABS 4.8  --   --   --   --   HGB 10.0* 9.3* 8.3*  --  7.5*  HCT 30.6* 28.8* 25.5*  --  22.0*  MCV 96.8 94.1 92.4  --  89.8  PLT 273 242 242 212 168    Recent Labs Lab 08/14/12 1538 08/14/12 2017 08/14/12 2345 08/15/12 0434 08/15/12 0749  GLUCAP 160* 166* 158* 144* 111*   ABG    Component Value Date/Time   PHART 7.533* 08/15/2012 0500   PCO2ART 32.8* 08/15/2012 0500   PO2ART 185.0* 08/15/2012 0500   HCO3 27.4* 08/15/2012 0500   TCO2 28.4 08/15/2012 0500   ACIDBASEDEF 4.0* 08/14/2012 1431   O2SAT 96.9 08/15/2012 0500   Studies/Results: Ct Abdomen Pelvis Wo Contrast  08/13/2012   *RADIOLOGY REPORT*  Clinical Data: Abdominal pain and unable to urinate.  CT ABDOMEN AND PELVIS  WITHOUT CONTRAST  Technique:  Multidetector CT imaging of the abdomen and pelvis was performed following the standard protocol without intravenous contrast.  Comparison: 08/04/2012  Findings: Lung bases are clear.  No evidence for pneumoperitoneum.  High density structure in the gallbladder is consistent with a gallstone.  No gross abnormality to the liver, spleen or pancreas. Normal appearance of the adrenal tissue.  Again noted is bilateral perinephric edema or stranding.  There is increased dilatation and stranding around the proximal right ureter.  Again seen are stones within the right kidney.  The largest right kidney stone measures 8 mm.  No clear evidence for a right ureter stone but the distal right ureter and the ureterovesical junction are obscured by the streak artifact from the bilateral hip replacements. However, compared to the previous examination, there appears to be one less stone within the right kidney and it is possible that a small stone has migrated into the distal right ureter.  There is now a punctate stone in the left renal pelvis which is was not present on the prior examination. There is at least mild left hydronephrosis.  There is increased edema in  the retroperitoneum since the prior examination.  Again noted are small lymph nodes in the periaortic region.  The urinary bladder is decompressed.  No acute bony abnormality.  IMPRESSION: Bilateral renal calculi.   A 3 mm stone has migrated to the left renal pelvis.  There is mild left hydronephrosis.  In general, there appears to be increased edema and stranding around the ureters bilaterally.  Mild dilatation of both ureters compared with the previous examination.  Difficult to exclude distal ureter or ureterovesical stones because of artifact in the pelvis.  It is possible that a small stone has migrated from the right kidney because there appears to be one less small stone in the right kidney compared to the prior examination.  Gallstone.   Original Report Authenticated By: Markus Daft, M.D.   Dg Chest Port 1 View  08/15/2012   *RADIOLOGY REPORT*  Clinical Data: Acute respiratory failure.  Intubation.  PORTABLE CHEST - 1 VIEW  Comparison: 08/14/2012  Findings: NG tube tip is in the distal esophagus and needs be advanced.  Endotracheal tube is in good position.  Central venous catheter in good position, unchanged.  The pulmonary vascularity is normal.  Minimal atelectasis and peribronchial thickening at the right base.  IMPRESSION: Slight right base atelectasis.  NG tube needs to be advanced.   Original Report Authenticated By: Lorriane Shire, M.D.   Dg Chest Port 1 View  08/14/2012   *RADIOLOGY REPORT*  Clinical Data: Hemodialysis catheter placement.  PORTABLE CHEST - 1 VIEW  Comparison: 08/13/2012 and 07/04/2010.  Findings: 0703 hours.  Interval right IJ dialysis catheter placement with tip at the level of the SVC right atrial junction. A nasogastric tube has been placed, projecting below the diaphragm. Endotracheal tube is unchanged within the mid trachea.  The heart size and mediastinal contours are stable.  There is stable mild atelectasis at both lung bases.  No pneumothorax or significant pleural  effusion is identified.  IMPRESSION: Interval hemodialysis catheter and nasogastric tube placement without demonstrated complication.   Original Report Authenticated By: Richardean Sale, M.D.   Dg Chest Port 1 View  08/13/2012   *RADIOLOGY REPORT*  Clinical Data: Chest pain.  PORTABLE CHEST - 1 VIEW  Comparison: Chest x-ray 07/04/2010.  Findings: An endotracheal tube is in place with tip 3.2 cm above the carina. Lung volumes are low.  No definite acute consolidative airspace disease.  No definite pleural effusions.  Crowding of the pulmonary vasculature, accentuated by low lung volumes.  Mild cardiomegaly is unchanged. The patient is rotated to the right on today's exam, resulting in distortion of the mediastinal contours and reduced diagnostic sensitivity and specificity for mediastinal pathology.  IMPRESSION: 1.  Tip of endotracheal tube is 3.2 cm above the carina. 2.  Low lung volumes. 3.  Mild cardiomegaly.   Original Report Authenticated By: Vinnie Langton, M.D.   Medications: . sodium chloride 1,000 mL (08/13/12 2355)  . phenylephrine (NEO-SYNEPHRINE) Adult infusion Stopped (08/15/12 0400)  . dialysis replacement fluid (prismasate) 300 mL/hr at 08/15/12 0215  . dialysis replacement fluid (prismasate) 200 mL/hr at 08/14/12 0855  . dialysate (PRISMASATE) 2,000 mL/hr at 08/15/12 0828   . antiseptic oral rinse  15 mL Mouth Rinse QID  . cefTAZidime (FORTAZ)  IV  2 g Intravenous Q12H  . chlorhexidine  15 mL Mouth Rinse BID  . Chlorhexidine Gluconate Cloth  6 each Topical Q0600  . insulin aspart  0-9 Units Subcutaneous Q4H  . [START ON 08/16/2012] levothyroxine  200 mcg Oral QAC breakfast  . mupirocin ointment  1 application Nasal BID  . pantoprazole (PROTONIX) IV  40 mg Intravenous QHS  . pneumococcal 23 valent vaccine  0.5 mL Intramuscular Tomorrow-1000  . vancomycin  1,250 mg Intravenous Q24H    I  have reviewed scheduled and prn medications.  ASSESSMENT/RECOMMENDATIONS  57 y.o. year-old  WM with a background of IDDM, hypertension, morbid obesity, hypothyroidism, and known stone disease, who presents with (by report) flank pain, gross hematuria (with supratherapeutic INR), acute kidney injury, hyperkalemia and anion gap metabolic acidosis. CT raised question of obstruction with changes not present on CT last admission. Renal replacement therapy was required to assist in correction of his profound metabolic acidosis and hyperkalemia, as well as correction of his renal failure.   Acute kidney injury superimposed on chronic (creatinine 1.8 on 08/07/12) Shock (resolved) and possible urosepsis/stones  ? Obstruction - [name] did not think obstruction significant and felt may have passed stone to account for hematuria  Did not feel left sided hydro needed intervention at this time, but did recommend repeat imaging in 1-2 days and if progressive hydro and normalized INR then perc tubes. Acidosis and hyperkalemia resolved and is making urine Stop CRRT today  Anion gap acidosis  Resolved  Hyperkalemia  Resolved  Uric acid stone disease with left hydro Reimage in 1-2 days  Jamal Maes, MD [place] pager 08/15/2012, 9:06 AM

## 2012-08-16 ENCOUNTER — Inpatient Hospital Stay (HOSPITAL_COMMUNITY): Payer: Medicare Other

## 2012-08-16 ENCOUNTER — Encounter (HOSPITAL_COMMUNITY): Payer: Self-pay | Admitting: Pulmonary Disease

## 2012-08-16 LAB — GLUCOSE, CAPILLARY
Glucose-Capillary: 181 mg/dL — ABNORMAL HIGH (ref 70–99)
Glucose-Capillary: 180 mg/dL — ABNORMAL HIGH (ref 70–99)

## 2012-08-16 LAB — HEMOGLOBIN AND HEMATOCRIT, BLOOD
HCT: 19 % — ABNORMAL LOW (ref 39.0–52.0)
Hemoglobin: 6.5 g/dL — CL (ref 13.0–17.0)

## 2012-08-16 LAB — PREPARE RBC (CROSSMATCH)

## 2012-08-16 LAB — RENAL FUNCTION PANEL
Albumin: 2 g/dL — ABNORMAL LOW (ref 3.5–5.2)
BUN: 37 mg/dL — ABNORMAL HIGH (ref 6–23)
BUN: 34 mg/dL — ABNORMAL HIGH (ref 6–23)
CO2: 27 mEq/L (ref 19–32)
Calcium: 7.9 mg/dL — ABNORMAL LOW (ref 8.4–10.5)
Chloride: 103 mEq/L (ref 96–112)
Creatinine, Ser: 1.94 mg/dL — ABNORMAL HIGH (ref 0.50–1.35)
GFR calc non Af Amer: 37 mL/min — ABNORMAL LOW (ref >90)
Phosphorus: 2.6 mg/dL (ref 2.3–4.6)
Potassium: 3.3 mEq/L — ABNORMAL LOW (ref 3.5–5.1)

## 2012-08-16 LAB — CBC
HCT: 19.8 % — ABNORMAL LOW (ref 39.0–52.0)
MCH: 30.6 pg (ref 26.0–34.0)
MCHC: 33.3 g/dL (ref 30.0–36.0)
MCV: 91.7 fL (ref 78.0–100.0)
Platelets: 147 10*3/uL — ABNORMAL LOW (ref 150–400)
RDW: 17 % — ABNORMAL HIGH (ref 11.5–15.5)
WBC: 4.9 10*3/uL (ref 4.0–10.5)

## 2012-08-16 MED ORDER — ROSUVASTATIN CALCIUM 10 MG PO TABS
10.0000 mg | ORAL_TABLET | Freq: Every day | ORAL | Status: DC
  Administered 2012-08-16 – 2012-08-17 (×2): 10 mg via ORAL
  Filled 2012-08-16 (×3): qty 1

## 2012-08-16 MED ORDER — POTASSIUM CHLORIDE CRYS ER 20 MEQ PO TBCR
40.0000 meq | EXTENDED_RELEASE_TABLET | Freq: Once | ORAL | Status: DC
Start: 2012-08-16 — End: 2012-08-16

## 2012-08-16 MED ORDER — ATORVASTATIN CALCIUM 40 MG PO TABS
40.0000 mg | ORAL_TABLET | Freq: Every day | ORAL | Status: DC
  Filled 2012-08-16: qty 1

## 2012-08-16 MED ORDER — GEMFIBROZIL 600 MG PO TABS
600.0000 mg | ORAL_TABLET | Freq: Two times a day (BID) | ORAL | Status: DC
  Administered 2012-08-16 – 2012-08-18 (×4): 600 mg via ORAL
  Filled 2012-08-16 (×7): qty 1

## 2012-08-16 MED ORDER — POTASSIUM CHLORIDE CRYS ER 20 MEQ PO TBCR
20.0000 meq | EXTENDED_RELEASE_TABLET | Freq: Once | ORAL | Status: AC
  Administered 2012-08-16: 20 meq via ORAL
  Filled 2012-08-16: qty 1

## 2012-08-16 MED ORDER — POTASSIUM CHLORIDE CRYS ER 20 MEQ PO TBCR
40.0000 meq | EXTENDED_RELEASE_TABLET | Freq: Once | ORAL | Status: AC
  Administered 2012-08-16: 40 meq via ORAL
  Filled 2012-08-16: qty 2

## 2012-08-16 MED ORDER — PANTOPRAZOLE SODIUM 40 MG PO TBEC
40.0000 mg | DELAYED_RELEASE_TABLET | Freq: Every day | ORAL | Status: DC
  Administered 2012-08-16 – 2012-08-21 (×6): 40 mg via ORAL
  Filled 2012-08-16 (×5): qty 1

## 2012-08-16 NOTE — Progress Notes (Signed)
[facility] Rounding Note  ASSESSMENT/RECOMMENDATIONS   57 y.o. year-old WM with a background of IDDM, hypertension, morbid obesity, hypothyroidism, and known stone disease, who presents with (by report) flank pain, gross hematuria (with supratherapeutic INR), acute kidney injury, hyperkalemia and anion gap metabolic acidosis. CT raised question of obstruction with changes not present on CT last admission. Renal replacement therapy was required to assist in correction of his profound metabolic acidosis and hyperkalemia, as well as correction of his renal failure.   Acute kidney injury superimposed on chronic (creatinine 1.8 on 08/07/12) Shock (resolved) and possible urosepsis/stones  ? If obstruction playing a role - [name] did not initially think obstruction significant and felt may have passed stone to account for hematuria but for repeat imaging today and if hydro worse c/w obstruction urology recs perc tubes  Off CRRT for 24 hours with (expected) rise in creatinine but non-oliguric Acidosis and hyperkalemia resolved  Will not remove HD cath until creatinine reaches plateau in case addition HD needed Give small dose K replacememt (will change 40 ordered--->20 and check K post transfusion as will likely get some K from the PRBC's and don't want to overdo it   Subjective:  Extubated Reports he passed 3 stones last week PTA Still with some pain Urine clearing Getting a unit of blood for Hb 6.6 UOP 2785 / 24 hours  Objective Vital signs in last 24 hours: Filed Vitals:   08/16/12 0700 08/16/12 0800 08/16/12 0840 08/16/12 0900  BP:      Pulse: 87 85 89 89  Temp: 99.6 F (37.6 C) 99.6 F (37.6 C) 99.5 F (37.5 C) 99.5 F (37.5 C)  TempSrc:  Core (Comment) Core (Comment)   Resp: 21 17 24 15   Height:      Weight:      SpO2: 98% 98%  99%   Weight change: -2.7 kg (-5 lb 15.2 oz)  Intake/Output Summary (Last 24 hours) at 08/16/12 1010 Last data filed at  08/16/12 0900  Gross per 24 hour  Intake   1302 ml  Output   1783 ml  Net   -481 ml   Physical Exam: BP 121/47  Pulse 89  Temp(Src) 99.5 F (37.5 C) (Core (Comment))  Resp 15  Ht 5\' 11"  (1.803 m)  Wt 135.4 kg (298 lb 8.1 oz)  BMI 41.65 kg/m2  SpO2 99% I/O last 3 completed shifts: In: 4050.7 [P.O.:240; I.V.:2231.5; Blood:665.5; NG/GT:60; IV Piggyback:853.7] Out: [id number] [Urine:2785; Other:1531] Total I/O In: 73 [I.V.:60; Blood:13] Out: 550 [Urine:550]   Obese WM NAD Awake, alert and oriented Lungs ant clear Obese abdomen, not particularly tender 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 +1 edema Urine in foley blood tinged but continues to clear Pus from penis  Labs: Basic Metabolic Panel:  Recent Labs Lab 08/13/12 2100 08/14/12 0750 08/14/12 1500 08/15/12 0350 08/16/12 0300  NA 139 139 139 143 138  K 6.5* 4.7 3.8 3.0* 3.3*  CL 108 110 106 104 104  CO2 10* 13* 19 27 26   GLUCOSE 115* 195* 195* 167* 216*  BUN 118* 110* 83* 45* 37*  CREATININE 5.60* 4.61* 3.23* 1.78* 1.95*  CALCIUM 9.5 8.4 8.2* 8.4 7.9*  PHOS  --  5.3* 3.5 1.4* 2.6   Liver Function Tests:  Recent Labs Lab 08/13/12 2100  08/14/12 0830 08/14/12 1500 08/15/12 0350 08/16/12 0300  AST 15  --  10  --   --   --   ALT 20  --  13  --   --   --   ALKPHOS 119*  --  101  --   --   --   BILITOT 0.2*  --  0.2*  --   --   --   PROT 7.0  --  5.7*  --   --   --   ALBUMIN 2.8*  < > 2.4* 2.4* 2.4* 2.0*  < > = values in this interval not displayed.  Recent Labs Lab 08/13/12 2100  LIPASE 27   No results found for this basename: AMMONIA,  in the last 168 hours CBC:  Recent Labs Lab 08/13/12 2100 08/14/12 0300 08/14/12 0750 08/14/12 1500 08/15/12 0350 08/16/12 0300 08/16/12 0400  WBC 6.4 7.0 5.6  --  5.0 4.9  --   NEUTROABS 4.8  --   --   --   --   --   --   HGB 10.0* 9.3* 8.3*  --  7.5* 6.6* 6.5*  HCT 30.6* 28.8* 25.5*  --  22.0* 19.8* 19.0*  MCV 96.8 94.1 92.4  --  89.8 91.7  --    PLT 273 242 242 212 168 147*  --     Recent Labs Lab 08/15/12 1218 08/15/12 1519 08/15/12 1727 08/15/12 2009 08/16/12 0736  GLUCAP 127* 172* 180* 230* 181*   ABG    Component Value Date/Time   PHART 7.437 08/15/2012 1610   PCO2ART 40.0 08/15/2012 1610   PO2ART 79.0* 08/15/2012 1610   HCO3 26.7* 08/15/2012 1610   TCO2 28 08/15/2012 1610   ACIDBASEDEF 4.0* 08/14/2012 1431   O2SAT 96.0 08/15/2012 1610  Urine and blood cultures negative  Studies/Results: Dg Chest Port 1 View  08/15/2012   *RADIOLOGY REPORT*  Clinical Data: Acute respiratory failure.  Intubation.  PORTABLE CHEST - 1 VIEW  Comparison: 08/14/2012  Findings: NG tube tip is in the distal esophagus and needs be advanced.  Endotracheal tube is in good position.  Central venous catheter in good position, unchanged.  The pulmonary vascularity is normal.  Minimal atelectasis and peribronchial thickening at the right base.  IMPRESSION: Slight right base atelectasis.  NG tube needs to be advanced.   Original Report Authenticated By: Lorriane Shire, M.D.   Medications:   . atorvastatin  40 mg Oral q1800  . cefTAZidime (FORTAZ)  IV  2 g Intravenous Q12H  . Chlorhexidine Gluconate Cloth  6 each Topical Q0600  . gemfibrozil  600 mg Oral BID AC  . insulin aspart  0-9 Units Subcutaneous TID WC  . levothyroxine  200 mcg Oral QAC breakfast  . mupirocin ointment  1 application Nasal BID  . pantoprazole  40 mg Oral Q1200  . potassium chloride  40 mEq Oral Once  . vancomycin  1,750 mg Intravenous Q24H    I  have reviewed scheduled and prn medications.   Jamal Maes, MD [facility] [phone] pager 08/16/2012, 10:10 AM

## 2012-08-16 NOTE — Progress Notes (Signed)
Critical HGB value of 6.6 given to Md, Dr Deterding.

## 2012-08-16 NOTE — Progress Notes (Signed)
PULMONARY  / CRITICAL CARE MEDICINE  Name: [name] MRN: [id number] DOB: 02/28/1955    ADMISSION DATE:  08/13/2012 CONSULTATION DATE:  08/13/2012  REFERRING MD :  Glendon Axe  PRIMARY SERVICE: PCCM  CHIEF COMPLAINT:  Flank and abdominal pain  BRIEF PATIENT DESCRIPTION:  57 yo male presented with flank pain and dysuria.  Found to have acute renal failure, hyperkalemia, anion gap metabolic acidosis, shock, and VDRF.  He had admission from 6/08 to 6/11 for nephrolithiasis, Klebsiella UTI, and renal failure.   Family reports pt was taking NSAID's for pain control.   SIGNIFICANT EVENTS: 6/17 Admit with VDRF, ARF, shock, hyperkalemia, metabolic acidosis [id number] Renal, urology consult, CRRT started 6/19 Off CRRT 6/20 Transfuse PRBC  STUDIES:  6/17 CT abdomen> 74mm stone in left renal pelvis with mild hydronephrosis, retroperitoneal adenopathy   LINES / TUBES: 6/17 ETT >> 6/19 6/18 Rt IJ HD cath >> 6/18 Rt Radial Aline >> 6/20  CULTURES: 6/18 blood >> 6/18 urine >> negative 6/18 MRSA screen >> POSITIVE  ANTIBIOTICS: 6/17 vanc >> 6/17 ceftaz >>  SUBJECTIVE:  C/o pain around penis.  Denies chest pain, dyspnea, or abdominal pain.  VITAL SIGNS: Temp:  [98.3 F (36.8 C)-101.3 F (38.5 C)] 99.5 F (37.5 C) (06/20 0840) Pulse Rate:  [87-105] 89 (06/20 0840) Resp:  [16-26] 24 (06/20 0840) SpO2:  [98 %-100 %] 98 % (06/20 0700) Arterial Line BP: (108-154)/(41-57) 132/52 mmHg (06/20 0840) Weight:  [298 lb 8.1 oz (135.4 kg)] 298 lb 8.1 oz (135.4 kg) (06/20 0331) 2 liters [place]  INTAKE / OUTPUT: Intake/Output     06/19 0701 - 06/20 0700 06/20 0701 - 06/21 0700   P.O. 240    I.V. (mL/kg) 514.7 (3.8) 60 (0.4)   Blood  13   NG/GT 60    IV Piggyback 554    Total Intake(mL/kg) 1368.7 (10.1) 73 (0.5)   Urine (mL/kg/hr) 1390 (0.4) 550 (1.9)   Other 66 (0)    Total Output 1456 550   Net -87.3 -477        Stool Occurrence 2 x      PHYSICAL EXAMINATION:  Gen: no distress HEENT: no  sinus tenderness PULM: decreased breath sounds, no wheeze CV: regular, no murmur AB: soft, non tender, decreased bowel sounds GU: Foley in place, purulent drainage from penis Ext: Rt lower leg valgus deformity, Lt AKA Derm: chronic venous stasis changes Rt leg Neuro: follows commands, moves extremities   LABS:  Recent Labs Lab 08/13/12 2100  08/13/12 2237  08/14/12 0301  08/14/12 0750 08/14/12 0830  08/14/12 1500 08/15/12 0350  08/15/12 0500 08/15/12 1536 08/15/12 1541 08/15/12 1610 08/16/12 0300 08/16/12 0400  HGB 10.0*  --   --   < >  --   --  8.3*  --   --   --  7.5*  --   --   --   --   --  6.6* 6.5*  WBC 6.4  --   --   < >  --   --  5.6  --   --   --  5.0  --   --   --   --   --  4.9  --   PLT 273  --   --   < >  --   --  242  --   --  212 168  --   --   --   --   --  147*  --   NA 139  --   --   --   --   --  139  --   --  139 143  --   --   --   --   --  138  --   K 6.5*  --   --   --   --   --  4.7  --   --  3.8 3.0*  --   --   --   --   --  3.3*  --   CL 108  --   --   --   --   --  110  --   --  106 104  --   --   --   --   --  104  --   CO2 10*  --   --   --   --   --  13*  --   --  19 27  --   --   --   --   --  26  --   GLUCOSE 115*  --   --   --   --   --  195*  --   --  195* 167*  --   --   --   --   --  216*  --   BUN 118*  --   --   --   --   --  110*  --   --  83* 45*  --   --   --   --   --  37*  --   CREATININE 5.60*  --   --   --   --   --  4.61*  --   --  3.23* 1.78*  --   --   --   --   --  1.95*  --   CALCIUM 9.5  --   --   --   --   --  8.4  --   --  8.2* 8.4  --   --   --   --   --  7.9*  --   MG  --   --   --   --   --   --  1.8  --   --   --  2.0  --   --   --   --   --   --   --   PHOS  --   --   --   --   --   < > 5.3*  --   --  3.5 1.4*  --   --   --   --   --  2.6  --   AST 15  --   --   --   --   --   --  10  --   --   --   --   --   --   --   --   --   --   ALT 20  --   --   --   --   --   --  13  --   --   --   --   --   --   --   --   --    --   ALKPHOS 119*  --   --   --   --   --   --  101  --   --   --   --   --   --   --   --   --   --  BILITOT 0.2*  --   --   --   --   --   --  0.2*  --   --   --   --   --   --   --   --   --   --   PROT 7.0  --   --   --   --   --   --  5.7*  --   --   --   --   --   --   --   --   --   --   ALBUMIN 2.8*  --   --   --   --   --  2.4* 2.4*  --  2.4* 2.4*  --   --   --   --   --  2.0*  --   APTT  --   --   --   --   --   --   --   --   --  71*  --   --   --   --   --   --   --   --   INR  --   < >  --   --   --   --  3.96*  --   --  2.84*  --   --   --   --  1.72*  --  1.70*  --   LATICACIDVEN  --   --  0.33*  --  0.7  --  0.6  --   --   --   --   --   --   --   --   --   --   --   PROCALCITON  --   --   --   --  0.39  --   --   --   --   --   --   --   --   --   --   --   --   --   PHART  --   < >  --   < >  --   < >  --   --   < >  --   --   < > 7.533* 7.474*  --  7.437  --   --   PCO2ART  --   < >  --   < >  --   < >  --   --   < >  --   --   < > 32.8* 37.3  --  40.0  --   --   PO2ART  --   < >  --   < >  --   < >  --   --   < >  --   --   < > 185.0* 120.0*  --  79.0*  --   --   < > = values in this interval not displayed.  Recent Labs Lab 08/15/12 1218 08/15/12 1519 08/15/12 1727 08/15/12 2009 08/16/12 0736  GLUCAP 127* 172* 180* 230* 181*    Imaging: Dg Chest Port 1 View  08/15/2012   *RADIOLOGY REPORT*  Clinical Data: Acute respiratory failure.  Intubation.  PORTABLE CHEST - 1 VIEW  Comparison: 08/14/2012  Findings: NG tube tip is in the distal esophagus and needs be advanced.  Endotracheal tube is in good position.  Central venous catheter in good position, unchanged.  The pulmonary vascularity is  normal.  Minimal atelectasis and peribronchial thickening at the right base.  IMPRESSION: Slight right base atelectasis.  NG tube needs to be advanced.   Original Report Authenticated By: Lorriane Shire, M.D.     ASSESSMENT / PLAN:  PULMONARY A: Acute respiratory failure 2nd to  renal failure and profound metabolic acidosis >> resolved. Hx of OSA/OHS. P:   -CPAP qhs -oxygen as needed to keep SpO2 > 92% -f/u CXR intermittently  CARDIOVASCULAR A: Shock likely from urosepsis >> off pressors 6/18. Hx of HTN, PVD, hyperlipidemia. P:  -even fluid balance -resume lipitor, lopid  RENAL A: Acute on chronic renal failure in setting of nephrolithiasis and hydronephrosis, shock with probable ATN, and NSAID use >> much improved 6/20. Hyperkalemia >> resolved. Hypokalemia. Hematuria with hx of uric acid stones. Penile discharge P:   -? If Hd catheter can be d/c'ed >> defer to nephrology -monitor renal fx, urine outpt -f/u and replace electrolytes as needed -f/u CT abd/pelvis 6/20 -defer whether to resume flomax to urology  GASTROINTESTINAL A:  Nutrition. P:   -carb modified diet -PPI for SUP  HEMATOLOGIC A:  Hx of  DVT. Coagulopathy due to warfarin (likely exacerbated by antibiotic use) >> transfused 5 units FFP 6/18. Anemia of critical illness and blood loss. P:  -f/u INR, CBC -transfuse for Hb < 7 or bleeding -check iron levels  -hold coumadin for now  INFECTIOUS A: Urosepsis. Rt leg cellulitis. Penile discharge. P:   -D3/x vancomycin, fortaz  ENDOCRINE A:  Hx of DM2. Hx of hypothyroidism P:   -SSI -continue synthroid  NEUROLOGIC A:  Acute encephalopathy in setting of narcotic use for flank pain, renal failure, acidosis >> resolved 6/19. P:   -prn tylenol, fentanyl for pain control  If CT abd/pelvis unremarkable, then consider transfer to SDU 6/20 in PM.  Chesley Mires, MD [place] 08/16/2012, 9:09 AM Pager:  [phone] After 3pm call: [phone]

## 2012-08-16 NOTE — Progress Notes (Signed)
PT Cancellation Note  Patient Details Name: [name] MRN: [id number] DOB: 1955/07/24   Cancelled Treatment:    Reason Eval/Treat Not Completed: Other (comment) (pt finishing lunch and refused mobility at this time ) [name]. [name].   Lanetta Inch Beth 08/16/2012, 1:34 PM Elwyn Reach, [place]

## 2012-08-16 NOTE — Progress Notes (Signed)
eLink Physician-Brief Progress Note Patient Name: [name] DOB: [birth year] MRN: [id number]  Date of Service  08/16/2012   HPI/Events of Note  Hgb drop from 7.5 to 6.5.  Had been on CRRT but currently on no heparin.  eICU Interventions  Plan Transfuse 1 unit of pRBC Post-transfusion CBC   Intervention Category Intermediate Interventions: Bleeding - evaluation and treatment with blood products  DETERDING,ELIZABETH 08/16/2012, 4:33 AM

## 2012-08-16 NOTE — Progress Notes (Signed)
Inpatient Diabetes Program Recommendations  AACE/ADA: New Consensus Statement on Inpatient Glycemic Control (2013)  Target Ranges:  Prepandial:   less than 140 mg/dL      Peak postprandial:   less than 180 mg/dL (1-2 hours)      Critically ill patients:  140 - 180 mg/dL   Reason for Visit: Results for LARI, ZERBY (MRN [id number]) as of 08/16/2012 15:11  Ref. Range 08/15/2012 20:09 08/16/2012 07:36 08/16/2012 12:23  Glucose-Capillary Latest Range: 70-99 mg/dL 230 (H) 181 (H) 250 (H)   According to medication reconciliation patient took Lantus 20 units bid prior to admission.  Please restart 1/2 of patients home dose of Lantus.  Consider Lantus 20 units daily.

## 2012-08-17 LAB — TYPE AND SCREEN
Antibody Screen: NEGATIVE
Unit division: 0

## 2012-08-17 LAB — CBC
Hemoglobin: 7.6 g/dL — ABNORMAL LOW (ref 13.0–17.0)
MCH: 30.2 pg (ref 26.0–34.0)
Platelets: 152 10*3/uL (ref 150–400)
RBC: 2.52 MIL/uL — ABNORMAL LOW (ref 4.22–5.81)
WBC: 6.1 10*3/uL (ref 4.0–10.5)

## 2012-08-17 LAB — RENAL FUNCTION PANEL
CO2: 27 mEq/L (ref 19–32)
Chloride: 104 mEq/L (ref 96–112)
Creatinine, Ser: 1.9 mg/dL — ABNORMAL HIGH (ref 0.50–1.35)
GFR calc Af Amer: 45 mL/min — ABNORMAL LOW (ref >90)
GFR calc non Af Amer: 38 mL/min — ABNORMAL LOW (ref >90)
Potassium: 3.6 mEq/L (ref 3.5–5.1)

## 2012-08-17 LAB — URINE CULTURE: Special Requests: NORMAL

## 2012-08-17 LAB — IRON AND TIBC
Iron: 35 ug/dL — ABNORMAL LOW (ref 42–135)
Saturation Ratios: 33 % (ref 20–55)
TIBC: 106 ug/dL — ABNORMAL LOW (ref 215–435)
UIBC: 71 ug/dL — ABNORMAL LOW (ref 125–400)

## 2012-08-17 LAB — GLUCOSE, CAPILLARY
Glucose-Capillary: 211 mg/dL — ABNORMAL HIGH (ref 70–99)
Glucose-Capillary: 236 mg/dL — ABNORMAL HIGH (ref 70–99)

## 2012-08-17 LAB — FERRITIN: Ferritin: 703 ng/mL — ABNORMAL HIGH (ref 22–322)

## 2012-08-17 MED ORDER — OXYCODONE-ACETAMINOPHEN 5-325 MG PO TABS
1.0000 | ORAL_TABLET | Freq: Four times a day (QID) | ORAL | Status: DC | PRN
  Administered 2012-08-17 – 2012-08-20 (×5): 1 via ORAL
  Filled 2012-08-17 (×5): qty 1

## 2012-08-17 MED ORDER — FENTANYL CITRATE 0.05 MG/ML IJ SOLN
25.0000 ug | INTRAMUSCULAR | Status: DC | PRN
Start: 2012-08-17 — End: 2012-08-20
  Administered 2012-08-17 – 2012-08-18 (×4): 50 ug via INTRAVENOUS
  Administered 2012-08-19 – 2012-08-20 (×8): 100 ug via INTRAVENOUS
  Administered 2012-08-20: 50 ug via INTRAVENOUS
  Administered 2012-08-20: 100 ug via INTRAVENOUS
  Filled 2012-08-17 (×16): qty 2

## 2012-08-17 MED ORDER — TAMSULOSIN HCL 0.4 MG PO CAPS
0.4000 mg | ORAL_CAPSULE | Freq: Every day | ORAL | Status: DC
  Administered 2012-08-17 – 2012-08-21 (×5): 0.4 mg via ORAL
  Filled 2012-08-17 (×5): qty 1

## 2012-08-17 MED ORDER — OXYCODONE HCL 5 MG PO TABS
5.0000 mg | ORAL_TABLET | Freq: Four times a day (QID) | ORAL | Status: DC | PRN
  Administered 2012-08-17 – 2012-08-20 (×6): 5 mg via ORAL
  Filled 2012-08-17 (×6): qty 1

## 2012-08-17 MED ORDER — OXYCODONE HCL 5 MG PO TABS
5.0000 mg | ORAL_TABLET | Freq: Four times a day (QID) | ORAL | Status: DC | PRN
Start: 2012-08-17 — End: 2012-08-17

## 2012-08-17 NOTE — Progress Notes (Signed)
Elink: 9:17 PM @ 08/17/2012    - severe penile pain  PLAN  - change to IV fentanyl  Dr. Brand Males, M.D., [facility].C.P Pulmonary and Critical Care Medicine Staff Physician [facility] Pager: [phone], If no answer or between  15:00h - 7:00h: call [phone]  08/17/2012 9:17 PM

## 2012-08-17 NOTE — Progress Notes (Signed)
Subjective: No acute urologic events. Patient denies abdominal or flank pain. Has urinary urgency from catheter.  Objective: Vital signs in last 24 hours: Temp:  [97 F (36.1 C)-99.6 F (37.6 C)] 99.1 F (37.3 C) (06/21 0500) Pulse Rate:  [49-90] 82 (06/21 0600) Resp:  [12-24] 21 (06/21 0600) BP: (108-152)/(57-86) 136/64 mmHg (06/21 0600) SpO2:  [93 %-100 %] 93 % (06/21 0600) Arterial Line BP: (107-142)/(48-62) 107/62 mmHg (06/20 1400) Weight:  [134.8 kg (297 lb 2.9 oz)] 134.8 kg (297 lb 2.9 oz) (06/21 0500)  Intake/Output from previous day: 06/20 0701 - 06/21 0700 In: 673 [P.O.:240; I.V.:370; Blood:13; IV Piggyback:50] Out: 2612 [Urine:2610; Stool:2] Intake/Output this shift: Total I/O In: 110 [I.V.:110] Out: 1360 [Urine:1360]  Physical Exam:  Constitutional: Vital signs reviewed. WD WN in NAD   Eyes: PERRL, No scleral icterus.    Significant lower extremity edema noted.  Urine is pain, but clearer than yesterday. There are no clots.   Lab Results:  Recent Labs  08/16/12 0400 08/16/12 1530 08/17/12 0325  HGB 6.5* 7.9* 7.6*  HCT 19.0* 23.4* 23.1*   BMET  Recent Labs  08/16/12 1100 08/17/12 0325  NA 138 138  K 3.2* 3.6  CL 103 104  CO2 27 27  GLUCOSE 216* 201*  BUN 34* 32*  CREATININE 1.94* 1.90*  CALCIUM 7.7* 7.9*    Recent Labs  08/15/12 1541 08/16/12 0300 08/16/12 1700  INR 1.72* 1.70* 1.41   No results found for this basename: LABURIN,  in the last 72 hours Results for orders placed during the hospital encounter of 08/13/12  URINE CULTURE     Status: None   Collection Time    08/13/12  8:19 PM      Result Value Range Status   Specimen Description URINE, CATHETERIZED   Final   Special Requests NONE   Final   Culture  Setup Time 08/14/2012 00:59   Final   Colony Count NO GROWTH   Final   Culture NO GROWTH   Final   Report Status 08/14/2012 FINAL   Final  MRSA PCR SCREENING     Status: Abnormal   Collection Time    08/14/12  1:28 AM       Result Value Range Status   MRSA by PCR POSITIVE (*) NEGATIVE Final   Comment:            The GeneXpert MRSA Assay (FDA     approved for NASAL specimens     only), is one component of a     comprehensive MRSA colonization     surveillance program. It is not     intended to diagnose MRSA     infection nor to guide or     monitor treatment for     MRSA infections.     RESULT CALLED TO, READ BACK BY AND VERIFIED WITH:     CALLED TO RN JAMES ARTIS [phone] @0420  THANEY  CULTURE, BLOOD (ROUTINE X 2)     Status: None   Collection Time    08/14/12  2:50 AM      Result Value Range Status   Specimen Description BLOOD RIGHT HAND   Final   Special Requests BOTTLES DRAWN AEROBIC ONLY [facility]   Final   Culture  Setup Time 08/14/2012 10:52   Final   Culture     Final   Value:        BLOOD CULTURE RECEIVED NO GROWTH TO DATE CULTURE WILL BE HELD FOR 5 DAYS BEFORE  ISSUING A FINAL NEGATIVE REPORT   Report Status PENDING   Incomplete  CULTURE, BLOOD (ROUTINE X 2)     Status: None   Collection Time    08/14/12  3:05 AM      Result Value Range Status   Specimen Description BLOOD RIGHT ARM   Final   Special Requests BOTTLES DRAWN AEROBIC ONLY [facility]   Final   Culture  Setup Time 08/14/2012 10:50   Final   Culture     Final   Value:        BLOOD CULTURE RECEIVED NO GROWTH TO DATE CULTURE WILL BE HELD FOR 5 DAYS BEFORE ISSUING A FINAL NEGATIVE REPORT   Report Status PENDING   Incomplete  WOUND CULTURE     Status: None   Collection Time    08/16/12 10:08 AM      Result Value Range Status   Specimen Description WOUND PENIS   Final   Special Requests NONE   Final   Gram Stain     Final   Value: NO WBC SEEN     MODERATE SQUAMOUS EPITHELIAL CELLS PRESENT     MODERATE YEAST   Culture PENDING   Incomplete   Report Status PENDING   Incomplete    Studies/Results: Ct Abdomen Pelvis Wo Contrast  08/16/2012   *RADIOLOGY REPORT*  Clinical Data: Mild left hydronephrosis.  Stone in the left renal pelvis.   CT ABDOMEN AND PELVIS WITHOUT CONTRAST  Technique:  Multidetector CT imaging of the abdomen and pelvis was performed following the standard protocol without intravenous contrast.  Comparison: CT scan dated 08/13/2012  Findings: There is new slight bibasilar atelectasis.  The stone seen previously in the left renal pelvis is no longer visible.  Stones in the left upper and lower poles are unchanged. The left renal pelvis is no longer prominent.  The both ureters are slightly more prominent throughout their length as compared to the prior study.  No visible distal ureteral calculi or bladder calculi.  Detail of the bladder base is somewhat compromised by the metallic artifact from the hip prostheses.  There is a small catheter in the bladder.  Right renal stones are unchanged.  The bilateral perinephric soft tissue stranding is slightly more prominent than on the prior study.  Note is made of a solitary gallstone which has now moved into the neck of the gallbladder.  Gallbladder is not distended.  Liver, spleen, pancreas, and adrenal glands are normal.  No dilated bile ducts.  Multiple diverticula scattered throughout the nondistended colon. No acute osseous abnormalities.  IMPRESSION:  1.  The patient appears to have passed the small stone that was in the left renal pelvis on the prior exam.  The left renal pelvis is no longer slightly distended. 2.  Both ureters appear slightly more prominent throughout their length but there is no evidence of stones obstructing the ureters.   Original Report Authenticated By: Lorriane Shire, M.D.    Assessment/Plan:   1. History of urolithiasis. I reviewed the CT from 08/16/12 and 08/13/12. It appears that he has no significant ureter obstruction bilaterally. No obstructing stones.    2. Gross hematuria. This has been chronic per his primary urologist, [name].    3. Renal insufficiency.  Seems to have stabilized.    I would avoid nephrostomy tube or stent at  this time. Continue foley catheter for accurate urine output measurement. Will restart flomax- discontinue if he experiences lightheadedness. I would try to d/c the catheter  once he is more ambulatory, but until that time, he is not likely to be able to void if the catheter were removed.   LOS: 4 days   Molli Hazard 08/17/2012, 6:50 AM

## 2012-08-17 NOTE — Progress Notes (Signed)
[facility] Rounding Note  ASSESSMENT/RECOMMENDATIONS   57 y.o. year-old WM with a background of IDDM, hypertension, morbid obesity, hypothyroidism, and known stone disease, baseline creatinine 1.8 after last hospital admission earlier this month who presented with flank pain, gross hematuria (with supratherapeutic INR), acute kidney injury, hyperkalemia and anion gap metabolic acidosis. CT raised question of obstruction with changes not present on CT last admission. Renal replacement therapy was required to assist in correction of his profound metabolic acidosis and hyperkalemia, as well as correction of his renal failure.   Acute kidney injury superimposed on chronic (creatinine 1.8 on 08/07/12) Shock (resolved) and possible urosepsis/stones  ? If obstruction played a role - [name] did not initially think obstruction significant and felt may have passed stone to account for hematuria (patient endorses passing 3-4 stones PTA) Repeat CT scan improved in terms of hydro and renal function stable since D/C of CRRT with good UOP and near most recent baseline of 1.8) Acidosis and hyperkalemia resolved   OK to remove HD cath Keep foley in per urology recs Will sign off at this time.  Please call if further questions.   Subjective:  Urine remains blood tinged with some clots and sediment Transfused 6/20 for Hb 6.6 C/o penile pain but no flank pain All he can really focus on is how this has again slowed his "progress toward getting a LLE prosthesis"  Objective Vital signs in last 24 hours: Filed Vitals:   08/17/12 0500 08/17/12 0600 08/17/12 0700 08/17/12 0800  BP: 152/86 136/64 123/73 134/74  Pulse: 79 82 80 84  Temp: 99.1 F (37.3 C)  98.7 F (37.1 C) 98.8 F (37.1 C)  TempSrc:      Resp: 18 21 22 25   Height:      Weight: 134.8 kg (297 lb 2.9 oz)     SpO2: 99% 93% 97% 100%   Weight change: -0.6 kg (-1 lb 5.2 oz)  Intake/Output Summary (Last 24 hours) at  08/17/12 1058 Last data filed at 08/17/12 0800  Gross per 24 hour  Intake    590 ml  Output   2312 ml  Net  -1722 ml   Physical Exam: BP 134/74  Pulse 84  Temp(Src) 98.8 F (37.1 C) (Oral)  Resp 25  Ht 5\' 11"  (1.803 m)  Wt 134.8 kg (297 lb 2.9 oz)  BMI 41.47 kg/m2  SpO2 100% I/O last 3 completed shifts: In: [id number] [P.O.:240; I.V.:740; Blood:13; IV Piggyback:50] Out: [id number] [Urine:3160; Stool:2] Total I/O In: 10 [I.V.:10] Out: 250 [Urine:250]  Obese WM NAD Awake, alert and oriented Lungs ant clear Obese abdomen, not tender 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 +1 edema Urine in foley blood tinged with some clots and sediment but continues to clear  Labs: Basic Metabolic Panel:  Recent Labs Lab 08/13/12 2100 08/14/12 0750 08/14/12 1500 08/15/12 0350 08/16/12 0300 08/16/12 1100 08/17/12 0325  NA 139 139 139 143 138 138 138  K 6.5* 4.7 3.8 3.0* 3.3* 3.2* 3.6  CL 108 110 106 104 104 103 104  CO2 10* 13* 19 27 26 27 27   GLUCOSE 115* 195* 195* 167* 216* 216* 201*  BUN 118* 110* 83* 45* 37* 34* 32*  CREATININE 5.60* 4.61* 3.23* 1.78* 1.95* 1.94* 1.90*  CALCIUM 9.5 8.4 8.2* 8.4 7.9* 7.7* 7.9*  PHOS  --  5.3* 3.5 1.4* 2.6 1.8* 2.0*   Liver Function Tests:  Recent Labs Lab 08/13/12 2100  08/14/12 0830  08/16/12 0300 08/16/12  1100 08/17/12 0325  AST 15  --  10  --   --   --   --   ALT 20  --  13  --   --   --   --   ALKPHOS 119*  --  101  --   --   --   --   BILITOT 0.2*  --  0.2*  --   --   --   --   PROT 7.0  --  5.7*  --   --   --   --   ALBUMIN 2.8*  < > 2.4*  < > 2.0* 2.0* 2.0*  < > = values in this interval not displayed.  Recent Labs Lab 08/13/12 2100  LIPASE 27    Recent Labs Lab 08/13/12 2100  08/14/12 0750 08/14/12 1500 08/15/12 0350 08/16/12 0300 08/16/12 0400 08/16/12 1530 08/17/12 0325  WBC 6.4  < > 5.6  --  5.0 4.9  --   --  6.1  NEUTROABS 4.8  --   --   --   --   --   --   --   --   HGB 10.0*  < > 8.3*  --  7.5* 6.6*  6.5* 7.9* 7.6*  HCT 30.6*  < > 25.5*  --  22.0* 19.8* 19.0* 23.4* 23.1*  MCV 96.8  < > 92.4  --  89.8 91.7  --   --  91.7  PLT 273  < > 242 212 168 147*  --   --  152  < > = values in this interval not displayed.  Recent Labs Lab 08/16/12 0736 08/16/12 1223 08/16/12 1558 08/16/12 1955 08/17/12 0757  GLUCAP 181* 250* 180* 192* 210*  Urine and blood cultures negative Gram stain of penile drainage yeast/no WBC's  Studies/Results: Ct Abdomen Pelvis Wo Contrast  08/16/2012   *RADIOLOGY REPORT*  Clinical Data: Mild left hydronephrosis.  Stone in the left renal pelvis.  CT ABDOMEN AND PELVIS WITHOUT CONTRAST  Technique:  Multidetector CT imaging of the abdomen and pelvis was performed following the standard protocol without intravenous contrast.  Comparison: CT scan dated 08/13/2012  Findings: There is new slight bibasilar atelectasis.  The stone seen previously in the left renal pelvis is no longer visible.  Stones in the left upper and lower poles are unchanged. The left renal pelvis is no longer prominent.  The both ureters are slightly more prominent throughout their length as compared to the prior study.  No visible distal ureteral calculi or bladder calculi.  Detail of the bladder base is somewhat compromised by the metallic artifact from the hip prostheses.  There is a small catheter in the bladder.  Right renal stones are unchanged.  The bilateral perinephric soft tissue stranding is slightly more prominent than on the prior study.  Note is made of a solitary gallstone which has now moved into the neck of the gallbladder.  Gallbladder is not distended.  Liver, spleen, pancreas, and adrenal glands are normal.  No dilated bile ducts.  Multiple diverticula scattered throughout the nondistended colon. No acute osseous abnormalities.  IMPRESSION:  1.  The patient appears to have passed the small stone that was in the left renal pelvis on the prior exam.  The left renal pelvis is no longer slightly  distended. 2.  Both ureters appear slightly more prominent throughout their length but there is no evidence of stones obstructing the ureters.   Original Report Authenticated By: Lorriane Shire, M.D.  Medications:   . cefTAZidime (FORTAZ)  IV  2 g Intravenous Q12H  . Chlorhexidine Gluconate Cloth  6 each Topical Q0600  . gemfibrozil  600 mg Oral BID AC  . insulin aspart  0-9 Units Subcutaneous TID WC  . levothyroxine  200 mcg Oral QAC breakfast  . mupirocin ointment  1 application Nasal BID  . pantoprazole  40 mg Oral Q1200  . rosuvastatin  10 mg Oral q1800  . tamsulosin  0.4 mg Oral QPC supper  . vancomycin  1,750 mg Intravenous Q24H    I  have reviewed scheduled and prn medications.   Jamal Maes, MD [facility] [phone] pager 08/17/2012, 10:58 AM

## 2012-08-17 NOTE — Evaluation (Signed)
Physical Therapy Evaluation Patient Details Name: [name] MRN: [id number] DOB: December 11, 1955 Today's Date: 08/17/2012 Time: [id number] PT Time Calculation (min): 31 min  PT Assessment / Plan / Recommendation Clinical Impression    57 yo male presented with flank pain and dysuria. Found to have acute renal failure, hyperkalemia, anion gap metabolic acidosis, shock, and VDRF. He had admission from 6/08 to 6/11 for nephrolithiasis, Klebsiella UTI, and renal failure.  Pt currently presents with significant deficits in functional mobility as indicated below. Pt reports PTA he was independent with transfers and w/c use. In current state, pt will benefit from [facility] prior to dc home to improve functional mobility.  Will continue to see as indicated and progress activity as tolerated.     PT Assessment  Patient needs continued PT services    Follow Up Recommendations  SNF    Does the patient have the potential to tolerate intense rehabilitation      Barriers to Discharge        Equipment Recommendations  None recommended by PT    Recommendations for Other Services OT consult   Frequency Min 2X/week    Precautions / Restrictions     Pertinent Vitals/Pain 7/10 groin      Mobility  Bed Mobility Bed Mobility: Rolling Right;Right Sidelying to Sit;Sitting - Scoot to [employer] of Bed;Sit to Supine;Scooting to [facility] Rolling Right: 1: +2 Total assist;With rail Rolling Right: Patient Percentage: 60% Right Sidelying to Sit: 1: +2 Total assist;With rails;HOB elevated Right Sidelying to Sit: Patient Percentage: 30% Sitting - Scoot to Edge of Bed: 1: +2 Total assist (with chuck pad) Sitting - Scoot to Edge of Bed: Patient Percentage: 10% Sit to Supine: 3: Mod assist Scooting to HOB: 1: +2 Total assist;With rail Scooting to [facility]: Patient Percentage: 40% Details for Bed Mobility Assistance: Patient extremely anxious with all aspects of mobility, increased time and significant assistance to get  patient to EOB. Assist for LE to return to bed as well as VCs for body positioing and technique to assist with scoot to [facility] in trendelenberg position.  Transfers Transfers: Not assessed Ambulation/Gait Ambulation/Gait Assistance: Not tested (comment)    Exercises  Encouraged LE exercises, SLR, Quad sets, Heels slides and Ankle pumps   PT Diagnosis: Generalized weakness;Acute pain  PT Problem List: Decreased strength;Decreased activity tolerance;Decreased balance;Decreased mobility;Pain PT Treatment Interventions: DME instruction;Functional mobility training;Therapeutic activities;Therapeutic exercise;Balance training;Patient/family education   PT Goals Acute Rehab PT Goals PT Goal Formulation: With patient Time For Goal Achievement: 08/31/12 Potential to Achieve Goals: Fair Pt will go Supine/Side to Sit: with modified independence PT Goal: Supine/Side to Sit - Progress: Goal set today Pt will Transfer Bed to Chair/Chair to Bed: with modified independence PT Transfer Goal: Bed to Chair/Chair to Bed - Progress: Goal set today  Visit Information  Last PT Received On: 08/17/12 Assistance Needed: +2    Subjective Data  Subjective: "Its hurts down there" referring to groin Patient Stated Goal: to do better   Prior Functioning  Home Living Lives With: Spouse Available Help at Discharge: Family Type of Home: Apartment Home Access: Level entry Home Layout: One level Home Adaptive Equipment: Bedside commode/3-in-1;Walker - rolling;Wheelchair - manual Prior Function Level of Independence: Independent with assistive device(s) (states independent with transfers) Able to Take Stairs?: No Driving: No Vocation: On disability Communication Communication: No difficulties Dominant Hand: Right    Cognition  Cognition Arousal/Alertness: Awake/alert Behavior During Therapy: Anxious Overall Cognitive Status: No family/caregiver present to determine baseline cognitive functioning  Extremity/Trunk Assessment Right Upper Extremity Assessment RUE ROM/Strength/Tone: [facility] for tasks assessed Left Upper Extremity Assessment LUE ROM/Strength/Tone: [facility] for tasks assessed Right Lower Extremity Assessment RLE ROM/Strength/Tone: [facility] for tasks assessed;Unable to fully assess (secondary to anxiety) Left Lower Extremity Assessment LLE ROM/Strength/Tone: Deficits;Unable to fully assess LLE ROM/Strength/Tone Deficits: prior BKA Trunk Assessment Trunk Assessment: Other exceptions Trunk Exceptions: impeding body habitus   Balance Static Sitting Balance Static Sitting - Balance Support: Feet supported Static Sitting - Level of Assistance: 4: Min assist Static Sitting - Comment/# of Minutes: 14 minutes, assessment of vitals, attempted strength testing, max VCs for encouragement and controlled anxiety.  Doctor present, educated on importance of mobility and efforts.  Activities performed for RLE positioning and trunk control. patient with heavy reliance on assist and bilateral self support with UE.   End of Session PT - End of Session Activity Tolerance: Patient limited by pain;Other (comment) (Exteme anxiety) Patient left: in bed;with call bell/phone within reach Nurse Communication: Mobility status  GP     [name] 08/17/2012, 12:23 PM [name] DPT  [phone]

## 2012-08-17 NOTE — Progress Notes (Signed)
PULMONARY  / CRITICAL CARE MEDICINE  Name: [name] MRN: [id number] DOB: 1955-06-03    ADMISSION DATE:  08/13/2012 CONSULTATION DATE:  08/13/2012  REFERRING MD :  Glendon Axe  PRIMARY SERVICE: PCCM  CHIEF COMPLAINT:  Flank and abdominal pain  BRIEF PATIENT DESCRIPTION:  57 yo male presented with flank pain and dysuria.  Found to have acute renal failure, hyperkalemia, anion gap metabolic acidosis, shock, and VDRF.  He had admission from 6/08 to 6/11 for nephrolithiasis, Klebsiella UTI, and renal failure.   Family reports pt was taking NSAID's for pain control.   SIGNIFICANT EVENTS: 6/17 Admit with VDRF, ARF, shock, hyperkalemia, metabolic acidosis 123XX123 Renal, urology consult, CRRT started 6/19 Off CRRT 6/20 Transfuse PRBC 6/21 Transfer to telemetry  STUDIES:  6/17 CT abdomen> 17mm stone in left renal pelvis with mild hydronephrosis, retroperitoneal adenopathy  6/20 CT abdomen> appears to have passed the small stone that was in the left renal pelvis   LINES / TUBES: 6/17 ETT >> 6/19 6/18 Rt IJ HD cath >> 6/18 Rt Radial Aline >> 6/20  CULTURES: 6/18 blood >> 6/18 urine >> negative 6/18 MRSA screen >> POSITIVE 6/20 Penis >>Yeast >>  ANTIBIOTICS: 6/17 vanc >> 6/17 ceftaz >>  SUBJECTIVE:  C/o pain around penis.  Denies chest pain, dyspnea, or abdominal pain.  VITAL SIGNS: Temp:  [97 F (36.1 C)-99.5 F (37.5 C)] 98.8 F (37.1 C) (06/21 0800) Pulse Rate:  [49-90] 84 (06/21 0800) Resp:  [12-25] 25 (06/21 0800) BP: (108-152)/(57-86) 134/74 mmHg (06/21 0800) SpO2:  [93 %-100 %] 100 % (06/21 0800) Arterial Line BP: (107-142)/(54-62) 107/62 mmHg (06/20 1400) Weight:  [297 lb 2.9 oz (134.8 kg)] 297 lb 2.9 oz (134.8 kg) (06/21 0500) 2 liters [place]  INTAKE / OUTPUT: Intake/Output     06/20 0701 - 06/21 0700 06/21 0701 - 06/22 0700   P.O. 240    I.V. (mL/kg) 380 (2.8) 10 (0.1)   Blood 13    NG/GT     IV Piggyback 50    Total Intake(mL/kg) 683 (5.1) 10 (0.1)   Urine  (mL/kg/hr) 2710 (0.8) 250 (1)   Other     Stool 2 (0)    Total Output 2712 250   Net -2029 -240          PHYSICAL EXAMINATION:  Gen: no distress HEENT: no sinus tenderness PULM: decreased breath sounds, no wheeze CV: regular, no murmur AB: soft, non tender, decreased bowel sounds GU: Foley in place Ext: Rt lower leg valgus deformity, Lt AKA Derm: chronic venous stasis changes Rt leg Neuro: follows commands, moves extremities   LABS:  Recent Labs Lab 08/13/12 2100  08/13/12 2237  08/14/12 0301  08/14/12 0750 08/14/12 0830  08/14/12 1500 08/15/12 0350  08/15/12 0500 08/15/12 1536 08/15/12 1541 08/15/12 1610 08/16/12 0300 08/16/12 0400 08/16/12 1100 08/16/12 1530 08/16/12 1700 08/17/12 0325  HGB 10.0*  --   --   < >  --   --  8.3*  --   --   --  7.5*  --   --   --   --   --  6.6* 6.5*  --  7.9*  --  7.6*  WBC 6.4  --   --   < >  --   --  5.6  --   --   --  5.0  --   --   --   --   --  4.9  --   --   --   --  6.1  PLT 273  --   --   < >  --   --  242  --   --  212 168  --   --   --   --   --  147*  --   --   --   --  152  NA 139  --   --   --   --   --  139  --   --  139 143  --   --   --   --   --  138  --  138  --   --  138  K 6.5*  --   --   --   --   --  4.7  --   --  3.8 3.0*  --   --   --   --   --  3.3*  --  3.2*  --   --  3.6  CL 108  --   --   --   --   --  110  --   --  106 104  --   --   --   --   --  104  --  103  --   --  104  CO2 10*  --   --   --   --   --  13*  --   --  19 27  --   --   --   --   --  26  --  27  --   --  27  GLUCOSE 115*  --   --   --   --   --  195*  --   --  195* 167*  --   --   --   --   --  216*  --  216*  --   --  201*  BUN 118*  --   --   --   --   --  110*  --   --  83* 45*  --   --   --   --   --  37*  --  34*  --   --  32*  CREATININE 5.60*  --   --   --   --   --  4.61*  --   --  3.23* 1.78*  --   --   --   --   --  1.95*  --  1.94*  --   --  1.90*  CALCIUM 9.5  --   --   --   --   --  8.4  --   --  8.2* 8.4  --   --   --   --    --  7.9*  --  7.7*  --   --  7.9*  MG  --   --   --   --   --   --  1.8  --   --   --  2.0  --   --   --   --   --   --   --   --   --   --   --   PHOS  --   --   --   --   --   < > 5.3*  --   --  3.5 1.4*  --   --   --   --   --  2.6  --  1.8*  --   --  2.0*  AST 15  --   --   --   --   --   --  10  --   --   --   --   --   --   --   --   --   --   --   --   --   --   ALT 20  --   --   --   --   --   --  13  --   --   --   --   --   --   --   --   --   --   --   --   --   --   ALKPHOS 119*  --   --   --   --   --   --  101  --   --   --   --   --   --   --   --   --   --   --   --   --   --   BILITOT 0.2*  --   --   --   --   --   --  0.2*  --   --   --   --   --   --   --   --   --   --   --   --   --   --   PROT 7.0  --   --   --   --   --   --  5.7*  --   --   --   --   --   --   --   --   --   --   --   --   --   --   ALBUMIN 2.8*  --   --   --   --   --  2.4* 2.4*  --  2.4* 2.4*  --   --   --   --   --  2.0*  --  2.0*  --   --  2.0*  APTT  --   --   --   --   --   --   --   --   --  71*  --   --   --   --   --   --   --   --   --   --   --   --   INR  --   < >  --   --   --   --  3.96*  --   --  2.84*  --   --   --   --  1.72*  --  1.70*  --   --   --  1.41  --   LATICACIDVEN  --   --  0.33*  --  0.7  --  0.6  --   --   --   --   --   --   --   --   --   --   --   --   --   --   --   PROCALCITON  --   --   --   --  0.39  --   --   --   --   --   --   --   --   --   --   --   --   --   --   --   --   --  PHART  --   < >  --   < >  --   < >  --   --   < >  --   --   < > 7.533* 7.474*  --  7.437  --   --   --   --   --   --   PCO2ART  --   < >  --   < >  --   < >  --   --   < >  --   --   < > 32.8* 37.3  --  40.0  --   --   --   --   --   --   PO2ART  --   < >  --   < >  --   < >  --   --   < >  --   --   < > 185.0* 120.0*  --  79.0*  --   --   --   --   --   --   < > = values in this interval not displayed.  Recent Labs Lab 08/16/12 0736 08/16/12 1223 08/16/12 1558 08/16/12 1955  08/17/12 0757  GLUCAP 181* 250* 180* 192* 210*    Imaging: Ct Abdomen Pelvis Wo Contrast  08/16/2012   *RADIOLOGY REPORT*  Clinical Data: Mild left hydronephrosis.  Stone in the left renal pelvis.  CT ABDOMEN AND PELVIS WITHOUT CONTRAST  Technique:  Multidetector CT imaging of the abdomen and pelvis was performed following the standard protocol without intravenous contrast.  Comparison: CT scan dated 08/13/2012  Findings: There is new slight bibasilar atelectasis.  The stone seen previously in the left renal pelvis is no longer visible.  Stones in the left upper and lower poles are unchanged. The left renal pelvis is no longer prominent.  The both ureters are slightly more prominent throughout their length as compared to the prior study.  No visible distal ureteral calculi or bladder calculi.  Detail of the bladder base is somewhat compromised by the metallic artifact from the hip prostheses.  There is a small catheter in the bladder.  Right renal stones are unchanged.  The bilateral perinephric soft tissue stranding is slightly more prominent than on the prior study.  Note is made of a solitary gallstone which has now moved into the neck of the gallbladder.  Gallbladder is not distended.  Liver, spleen, pancreas, and adrenal glands are normal.  No dilated bile ducts.  Multiple diverticula scattered throughout the nondistended colon. No acute osseous abnormalities.  IMPRESSION:  1.  The patient appears to have passed the small stone that was in the left renal pelvis on the prior exam.  The left renal pelvis is no longer slightly distended. 2.  Both ureters appear slightly more prominent throughout their length but there is no evidence of stones obstructing the ureters.   Original Report Authenticated By: Lorriane Shire, M.D.     ASSESSMENT / PLAN:  PULMONARY A: Acute respiratory failure 2nd to renal failure and profound metabolic acidosis >> resolved. Hx of OSA/OHS. P:   -CPAP qhs -oxygen as needed  to keep SpO2 > 92% -f/u CXR as needed  CARDIOVASCULAR A: Shock likely from urosepsis >> off pressors 6/18. Hx of HTN, PVD, hyperlipidemia. P:  -even fluid balance -continue lipitor, lopid  RENAL A: Acute on chronic renal failure in setting of nephrolithiasis and hydronephrosis, shock with probable ATN, and NSAID use >> much improved 6/20.  Hyperkalemia >> resolved. Hypokalemia. Hematuria with hx of uric acid stones. Penile discharge. P:   -? If Hd catheter can be d/c'ed >> defer to nephrology -monitor renal fx, urine outpt -f/u and replace electrolytes as needed -keep foley in >> defer decision to d/c to urology -continue flomax  GASTROINTESTINAL A:  Nutrition. P:   -carb modified diet -PPI for SUP  HEMATOLOGIC A:  Hx of  DVT. Coagulopathy due to warfarin (likely exacerbated by antibiotic use) >> transfused 5 units FFP 6/18. Anemia of critical illness and blood loss. P:  -f/u INR, CBC -transfuse for Hb < 7 or bleeding -hold coumadin for now  INFECTIOUS A: Urosepsis. Rt leg cellulitis. Penile discharge. P:   -D4/x vancomycin, fortaz >> narrow abx once culture results final  ENDOCRINE A:  Hx of DM2. Hx of hypothyroidism P:   -SSI -continue synthroid  NEUROLOGIC A:  Acute encephalopathy in setting of narcotic use for flank pain, renal failure, acidosis >> resolved 6/19. P:   -prn tylenol, fentanyl for pain control  Transfer to telemetry.  Transfer to [facility] for 6/22 and PCCM sign off.  Chesley Mires, MD [facility] Pulmonary/Critical Care 08/17/2012, 8:49 AM Pager:  [phone] After 3pm call: [phone]

## 2012-08-17 NOTE — Progress Notes (Signed)
1245. 08/17/12 nsg To unit 6700 per bed accompanied by RN and Nt alert and oriented patient with 02; placed on telemetry; no skin issues noted. Oriented to unit set up, call light within reach. Wife at bedside

## 2012-08-18 DIAGNOSIS — I82409 Acute embolism and thrombosis of unspecified deep veins of unspecified lower extremity: Secondary | ICD-10-CM

## 2012-08-18 DIAGNOSIS — B3749 Other urogenital candidiasis: Secondary | ICD-10-CM

## 2012-08-18 DIAGNOSIS — B3741 Candidal cystitis and urethritis: Secondary | ICD-10-CM

## 2012-08-18 DIAGNOSIS — N186 End stage renal disease: Secondary | ICD-10-CM

## 2012-08-18 LAB — GLUCOSE, CAPILLARY
Glucose-Capillary: 212 mg/dL — ABNORMAL HIGH (ref 70–99)
Glucose-Capillary: 219 mg/dL — ABNORMAL HIGH (ref 70–99)

## 2012-08-18 LAB — URINALYSIS, ROUTINE W REFLEX MICROSCOPIC
Nitrite: NEGATIVE
Specific Gravity, Urine: 1.012 (ref 1.005–1.030)
pH: 6 (ref 5.0–8.0)

## 2012-08-18 LAB — CBC
Hemoglobin: 7.9 g/dL — ABNORMAL LOW (ref 13.0–17.0)
MCH: 30 pg (ref 26.0–34.0)
MCV: 93.2 fL (ref 78.0–100.0)
RBC: 2.63 MIL/uL — ABNORMAL LOW (ref 4.22–5.81)
WBC: 7.3 10*3/uL (ref 4.0–10.5)

## 2012-08-18 LAB — WOUND CULTURE

## 2012-08-18 LAB — BASIC METABOLIC PANEL
CO2: 25 mEq/L (ref 19–32)
Calcium: 7.8 mg/dL — ABNORMAL LOW (ref 8.4–10.5)
Chloride: 102 mEq/L (ref 96–112)
Creatinine, Ser: 1.93 mg/dL — ABNORMAL HIGH (ref 0.50–1.35)
Glucose, Bld: 235 mg/dL — ABNORMAL HIGH (ref 70–99)

## 2012-08-18 LAB — URINE MICROSCOPIC-ADD ON

## 2012-08-18 MED ORDER — FLUCONAZOLE 200 MG PO TABS: 200.0000 mg | ORAL_TABLET | Freq: Every day | ORAL | Status: DC

## 2012-08-18 MED ORDER — FLUCONAZOLE 100 MG PO TABS: 100.0000 mg | ORAL_TABLET | Freq: Every day | ORAL | Status: DC

## 2012-08-18 MED ORDER — DEXTROSE 5 % IV SOLN
2.0000 g | Freq: Three times a day (TID) | INTRAVENOUS | Status: DC
  Filled 2012-08-18 (×3): qty 2

## 2012-08-18 MED ORDER — INSULIN ASPART 100 UNIT/ML ~~LOC~~ SOLN
0.0000 [IU] | Freq: Every day | SUBCUTANEOUS | Status: DC
  Administered 2012-08-19 – 2012-08-20 (×3): 2 [IU] via SUBCUTANEOUS

## 2012-08-18 MED ORDER — ATORVASTATIN CALCIUM 40 MG PO TABS
40.0000 mg | ORAL_TABLET | Freq: Every day | ORAL | Status: DC
  Administered 2012-08-18 – 2012-08-21 (×4): 40 mg via ORAL
  Filled 2012-08-18 (×4): qty 1

## 2012-08-18 MED ORDER — INSULIN GLARGINE 100 UNIT/ML ~~LOC~~ SOLN
10.0000 [IU] | Freq: Two times a day (BID) | SUBCUTANEOUS | Status: DC
  Administered 2012-08-18 – 2012-08-19 (×3): 10 [IU] via SUBCUTANEOUS
  Filled 2012-08-18 (×4): qty 0.1

## 2012-08-18 MED ORDER — WARFARIN - PHARMACIST DOSING INPATIENT: Freq: Every day | Status: DC

## 2012-08-18 MED ORDER — WARFARIN SODIUM 3 MG PO TABS
3.0000 mg | ORAL_TABLET | Freq: Once | ORAL | Status: AC
  Administered 2012-08-18: 3 mg via ORAL
  Filled 2012-08-18: qty 1

## 2012-08-18 MED ORDER — VITAMIN C 250 MG PO TABS
250.0000 mg | ORAL_TABLET | Freq: Every day | ORAL | Status: DC
  Administered 2012-08-18 – 2012-08-21 (×4): 250 mg via ORAL
  Filled 2012-08-18 (×4): qty 1

## 2012-08-18 MED ORDER — FLUCONAZOLE 100 MG PO TABS
100.0000 mg | ORAL_TABLET | Freq: Every day | ORAL | Status: DC
  Administered 2012-08-18 – 2012-08-21 (×4): 100 mg via ORAL
  Filled 2012-08-18 (×4): qty 1

## 2012-08-18 MED ORDER — INSULIN ASPART 100 UNIT/ML ~~LOC~~ SOLN
0.0000 [IU] | Freq: Three times a day (TID) | SUBCUTANEOUS | Status: DC
  Administered 2012-08-18: 8 [IU] via SUBCUTANEOUS
  Administered 2012-08-19: 3 [IU] via SUBCUTANEOUS
  Administered 2012-08-19 (×2): 5 [IU] via SUBCUTANEOUS
  Administered 2012-08-20 (×2): 3 [IU] via SUBCUTANEOUS
  Administered 2012-08-20: 5 [IU] via SUBCUTANEOUS
  Administered 2012-08-21: 3 [IU] via SUBCUTANEOUS
  Administered 2012-08-21: 5 [IU] via SUBCUTANEOUS
  Administered 2012-08-21: 3 [IU] via SUBCUTANEOUS

## 2012-08-18 MED ORDER — FERROUS SULFATE 325 (65 FE) MG PO TABS
325.0000 mg | ORAL_TABLET | Freq: Two times a day (BID) | ORAL | Status: DC
  Administered 2012-08-18 – 2012-08-21 (×7): 325 mg via ORAL
  Filled 2012-08-18 (×8): qty 1

## 2012-08-18 MED ORDER — CYCLOBENZAPRINE HCL 10 MG PO TABS
5.0000 mg | ORAL_TABLET | Freq: Two times a day (BID) | ORAL | Status: DC | PRN
  Administered 2012-08-19 – 2012-08-20 (×2): 5 mg via ORAL
  Filled 2012-08-18 (×3): qty 1

## 2012-08-18 NOTE — Progress Notes (Signed)
Patient placed on nasal CPAP. Patient was told not to chew gum while on CPAP, because it puts him at very high risk of choking. Patient may not be compliant about chewing gum. RN notified.

## 2012-08-18 NOTE — Progress Notes (Signed)
ANTIBIOTIC CONSULT NOTE - Follow-up  Pharmacy Consult for Vancomycin and Fortaz Indication: cellulitis/UTI  No Known Allergies  Patient Measurements: Height: 5\' 11"  (180.3 cm) Weight: 297 lb 6.4 oz (134.9 kg) IBW/kg (Calculated) : 75.3 Adjusted Body Weight: 100 kg  Vital Signs: Temp: 98.1 F (36.7 C) (06/22 0617) Temp src: Oral (06/22 0617) BP: 121/79 mmHg (06/22 0617) Pulse Rate: 78 (06/22 0617)  Labs:  Recent Labs  08/16/12 0300  08/16/12 1100 08/16/12 1530 08/17/12 0325 08/18/12 0605  WBC 4.9  --   --   --  6.1 7.3  HGB 6.6*  < >  --  7.9* 7.6* 7.9*  PLT 147*  --   --   --  152 173  CREATININE 1.95*  --  1.94*  --  1.90* 1.93*  < > = values in this interval not displayed. Estimated Creatinine Clearance: 61.3 ml/min (by C-G formula based on Cr of 1.93). No results found for this basename: VANCOTROUGH, VANCOPEAK, VANCORANDOM, GENTTROUGH, GENTPEAK, GENTRANDOM, TOBRATROUGH, TOBRAPEAK, TOBRARND, AMIKACINPEAK, AMIKACINTROU, AMIKACIN,  in the last 72 hours   Assessment: 57 yo male with UTI/ARF receiving empiric vancomycin and fortaz.  CRRT now off and renal function is remaining stable with SCr ~1.9 and CrCl ~65mL/min. UOP 0.7 ml/kg/hr.   Vanc 6/17 >> [name] 6/17 >>  6/20 wound- candida albicans 6/18 blood x 2 - NGTD 6/18 urine - NEG 6/18 MRSA PCR - positive  Goal of Therapy:  Vancomycin trough level 10-15 mcg/ml  Plan:  - Cont Vancomycin to 1750 mg IV q24h - will get trough tomorrow as patient's renal function has been labile - Change Fortaz to 2g IV q8h with improved renal function - Follow up SCr, UOP, cultures, clinical course and adjust as clinically indicated.  [name], PharmD Clinical Pharmacist Pager: [phone] 08/18/2012 11:14 AM

## 2012-08-18 NOTE — Progress Notes (Addendum)
[facility] PROGRESS NOTE  [name] [id number] DOB: 03-20-1955 DOA: 08/13/2012 PCP: [name], MD  Summary: The patient is a 57 year old male who presented with flank pain and dysuria. He was found to have acute renal failure, hyperkalemia, anion gap metabolic acidosis, shock and ventilator dependent respiratory failure. He was admitted from June 8 to June 11 for nephrolithiasis, Klebsiella urinary tract infection, and renal failure. He had been taking NSAIDs at home for pain control.  He was intubated for acute respiratory failure secondary to renal failure and profound metabolic acidosis and admitted on June 17 to the ICU.  He was able to be extubated on the 19th.  Refractory shock secondary to sepsis due to pyelonephritis he was started on vasopressors on the 17th. They were able to be discontinued on the 18th.  For his renal failure, he underwent repeat CT scan which demonstrated bilateral renal calculi. There was increased edema and stranding around both ureters bilaterally. He also had mild dilation of both ureters compared to previous studies. There were no obstructing stones. Nephrology and urology were consulted.  He was started on CRRT on June 18 due to severe metabolic acidosis, uremia, hyperkalemia. This was discontinued on the 19th.  He has had more than 2 L of urine output daily and gradual improvement in his BUN and creatinine to his baseline.  Repeat CT scan on 6/20 demonstrated that he had passed a small stone which was in the left renal pelvis.  He was started on broad-spectrum antibiotics at admission. His urine cultures so far has been no growth final x2. He was diagnosed with a right lower extremity cellulitis superimposed on chronic changes and ulceration.    SIGNIFICANT EVENTS:  6/17 Admit with VDRF, ARF, shock, hyperkalemia, metabolic acidosis  [id number] Renal, urology consult, CRRT started  6/19 Off CRRT  6/20 Transfuse PRBC  6/21 Transfer to telemetry    STUDIES:  6/17 CT abdomen> 15mm stone in left renal pelvis with mild hydronephrosis, retroperitoneal adenopathy  6/20 CT abdomen> appears to have passed the small stone that was in the left renal pelvis   LINES / TUBES:  6/17 ETT >> 6/19  6/18 Rt IJ HD cath >>  6/18 Rt Radial Aline >> 6/20   CULTURES:  6/18 blood >> NGTD 6/18 urine >> negative  6/18 MRSA screen >> POSITIVE  6/20 Penis >>Yeast >>      Assessment/Plan  Acute respiratory failure due to renal failure and profound metabolic acidosis, resolved Refractory shock from urosepsis, resolved  Acute on chronic renal failure due to nephrolithiasis and hydronephrosis, ATN and NSAID use, back to baseline kidney function, CKD stage 3. -  Minimize nephrotoxins and renally dosed medications -  Appreciate nephrology assistance -  HD catheter has been removed  Severe dysuria, possibly due to candidal UTI or candidal urethritis -  Spoke with [name] regarding clinical course and culture findings. Recommends treating for candidal infection as we cannot currently remove his Foley catheter -  Remove Foley catheter as soon as possible, can be removed when patient is able to get up and ambulate -  Encourage ambulation out of bed -  Continue physical therapy -  Discontinue broad-spectrum antibiotics as all cultures have been negative to date -  Add GC/Ch   Nephrolithiasis/urolithiasis due to uric acid stones, no current obstruction -  Appreciate nephrology recommendations  Gross hematuria, resolving. Followup per his primary urologist [name]  BPH with urinary tract obstruction -  Continue Flomax  History of DVT.  Anticoagulation was reversed with FFP on June 18.   -  Restart Coumadin  Normocytic anemia, likely due to marrow suppression from acute illness, iron deficiency and renal parenchymal disease.   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   -  Offered anxiolytic or depression medication but patient declined at this tim  Chronic venous stasis with ulceration and chronic stasis dermatitis -  Wound care consult  Diet:  Diabetic Access:  PIV IVF:  Off Proph:  SCD  Code Status: full Family Communication: spoke with patient alone Disposition Plan: SNF   Consultants:  PCCM  Nephrology  Urology  ANTIBIOTICS:  6/17 vanc >> 6/22 6/17 ceftaz >> 6/22  6/22 fluconazole >>  HPI/Subjective:  Patient states he continues to have severe dysuria. The pain interferes with his ability to stand up.  Denies fevers, chills, nausea, vomiting, constipation.  He denies abdominal pain  Objective: Filed Vitals:   08/17/12 1712 08/17/12 2135 08/18/12 0015 08/18/12 0617  BP: 110/75 108/63  121/79  Pulse: 77 88 82 78  Temp: 98.7 F (37.1 C) 98.6 F (37 C)  98.1 F (36.7 C)  TempSrc: Oral Oral  Oral  Resp: 20 22 20 20   Height:      Weight:  134.9 kg (297 lb 6.4 oz)    SpO2: 99% 96% 98% 94%    Intake/Output Summary (Last 24 hours) at 08/18/12 1508 Last data filed at 08/18/12 0700  Gross per 24 hour  Intake    990 ml  Output   1550 ml  Net   -560 ml   Filed Weights   08/16/12 0331 08/17/12 0500 08/17/12 2135  Weight: 135.4 kg (298 lb 8.1 oz) 134.8 kg (297 lb 2.9 oz) 134.9 kg (297 lb 6.4 oz)    Exam:   General:  Obese caucasian male,  No acute distress, tearful affect  HEENT:  NCAT, MMM  Cardiovascular:  RRR, nl S1, S2 no mrg,  2+ pulses, warm extremities  Respiratory:  CTAB, no increased WOB  Abdomen:  NABS, soft, ND, mild tenderness to palpation diffusely without rebound or guarding  MSK:  Normal tone and bulk, left leg amputation.  Neuro:  Grossly intact  GU: Purulent material from the penis.  Foley bag with a couple inches of milk white sediment.    Skin: Right lower extremity with chronic skin changes.  2 ulcers on the right shin without surrounding erythema or induration.    Data Reviewed: Basic Metabolic Panel:  Recent Labs Lab 08/14/12 0750 08/14/12 1500 08/15/12 0350 08/16/12 0300 08/16/12 1100 08/17/12 0325 08/18/12 0605  NA 139 139 143 138 138 138 134*  K 4.7 3.8 3.0* 3.3* 3.2* 3.6 3.9  CL 110 106 104 104 103 104 102  CO2 13* 19 27 26 27 27 25   GLUCOSE 195* 195* 167* 216* 216* 201* 235*  BUN 110* 83* 45* 37* 34* 32* 30*  CREATININE 4.61* 3.23* 1.78* 1.95* 1.94* 1.90* 1.93*  CALCIUM 8.4 8.2* 8.4 7.9* 7.7* 7.9* 7.8*  MG 1.8  --  2.0  --   --   --   --   PHOS 5.3* 3.5 1.4* 2.6 1.8* 2.0*  --    Liver Function Tests:  Recent Labs Lab 08/13/12 2100  08/14/12 0830 08/14/12 1500 08/15/12 0350 08/16/12 0300 08/16/12 1100 08/17/12 0325  AST 15  --  10  --   --   --   --   --   ALT 20  --  13  --   --   --   --   --   ALKPHOS 119*  --  101  --   --   --   --   --   BILITOT 0.2*  --  0.2*  --   --   --   --   --   PROT 7.0  --  5.7*  --   --   --   --   --   ALBUMIN 2.8*  < > 2.4* 2.4* 2.4* 2.0* 2.0* 2.0*  < > = values in this interval not displayed.  Recent Labs Lab 08/13/12 2100  LIPASE 27   No results found for this basename: AMMONIA,  in the last 168 hours CBC:  Recent Labs Lab 08/13/12 2100  08/14/12 0750 08/14/12 1500 08/15/12 0350 08/16/12 0300 08/16/12 0400 08/16/12 1530 08/17/12 0325 08/18/12 0605  WBC 6.4  < > 5.6  --  5.0 4.9  --   --  6.1 7.3  NEUTROABS 4.8  --   --   --   --   --   --   --   --   --   HGB 10.0*  < > 8.3*  --  7.5* 6.6* 6.5* 7.9* 7.6*  7.9*  HCT 30.6*  < > 25.5*  --  22.0* 19.8* 19.0* 23.4* 23.1* 24.5*  MCV 96.8  < > 92.4  --  89.8 91.7  --   --  91.7 93.2  PLT 273  < > 242 212 168 147*  --   --  152 173  < > = values in this interval not displayed. Cardiac Enzymes: No results found for this basename: CKTOTAL, CKMB, CKMBINDEX, TROPONINI,  in the last 168 hours BNP (last 3 results) No results found for this basename: PROBNP,  in the last 8760 hours CBG:  Recent Labs Lab 08/17/12 1247 08/17/12 1714 08/17/12 2133 08/18/12 0755 08/18/12 1216  GLUCAP 208* 211* 236* 210* 212*    Recent Results (from the past 240 hour(s))  URINE CULTURE     Status: None   Collection Time    08/13/12  8:19 PM      Result Value Range Status   Specimen Description URINE, CATHETERIZED   Final   Special Requests NONE   Final   Culture  Setup Time 08/14/2012 00:59   Final   Colony Count NO GROWTH   Final   Culture NO GROWTH   Final   Report Status 08/14/2012 FINAL   Final  MRSA PCR SCREENING     Status: Abnormal   Collection Time    08/14/12  1:28 AM      Result Value Range Status   MRSA by PCR POSITIVE (*) NEGATIVE Final   Comment:            The GeneXpert MRSA Assay (FDA     approved for NASAL specimens  only), is one component of a     comprehensive MRSA colonization     surveillance program. It is not     intended to diagnose MRSA     infection nor to guide or     monitor treatment for     MRSA infections.     RESULT CALLED TO, READ BACK BY AND VERIFIED WITH:     CALLED TO RN JAMES ARTIS [phone] @0420  THANEY  CULTURE, BLOOD (ROUTINE X 2)     Status: None   Collection Time    08/14/12  2:50 AM      Result Value Range Status   Specimen Description BLOOD RIGHT HAND   Final   Special Requests BOTTLES DRAWN AEROBIC ONLY 6CC   Final   Culture  Setup Time 08/14/2012 10:52   Final   Culture     Final   Value:        BLOOD CULTURE RECEIVED NO GROWTH TO DATE CULTURE WILL BE HELD FOR 5 DAYS BEFORE ISSUING A FINAL NEGATIVE  REPORT   Report Status PENDING   Incomplete  CULTURE, BLOOD (ROUTINE X 2)     Status: None   Collection Time    08/14/12  3:05 AM      Result Value Range Status   Specimen Description BLOOD RIGHT ARM   Final   Special Requests BOTTLES DRAWN AEROBIC ONLY 10CC   Final   Culture  Setup Time 08/14/2012 10:50   Final   Culture     Final   Value:        BLOOD CULTURE RECEIVED NO GROWTH TO DATE CULTURE WILL BE HELD FOR 5 DAYS BEFORE ISSUING A FINAL NEGATIVE REPORT   Report Status PENDING   Incomplete  WOUND CULTURE     Status: None   Collection Time    08/16/12 10:08 AM      Result Value Range Status   Specimen Description WOUND PENIS   Final   Special Requests NONE   Final   Gram Stain     Final   Value: NO WBC SEEN     MODERATE SQUAMOUS EPITHELIAL CELLS PRESENT     MODERATE YEAST   Culture MODERATE CANDIDA ALBICANS   Final   Report Status 08/18/2012 FINAL   Final  URINE CULTURE     Status: None   Collection Time    08/16/12 12:11 PM      Result Value Range Status   Specimen Description URINE, CATHETERIZED   Final   Special Requests Normal   Final   Culture  Setup Time 08/16/2012 12:50   Final   Colony Count NO GROWTH   Final   Culture NO GROWTH   Final   Report Status 08/17/2012 FINAL   Final     Studies: No results found.  Scheduled Meds: . cefTAZidime (FORTAZ)  IV  2 g Intravenous Q8H  . Chlorhexidine Gluconate Cloth  6 each Topical Q0600  . gemfibrozil  600 mg Oral BID AC  . insulin aspart  0-9 Units Subcutaneous TID WC  . insulin glargine  10 Units Subcutaneous BID  . levothyroxine  200 mcg Oral QAC breakfast  . mupirocin ointment  1 application Nasal BID  . pantoprazole  40 mg Oral Q1200  . rosuvastatin  10 mg Oral q1800  . tamsulosin  0.4 mg Oral QPC supper  . vancomycin  1,750 mg Intravenous Q24H   Continuous Infusions:   Principal Problem:   AKI (acute kidney injury) Active  Problems:   DIABETES MELLITUS-TYPE II   Hyperkalemia   Metabolic acidosis    Hypothyroidism   Cellulitis   Nephrolithiasis   Acute respiratory failure with hypoxia    Time spent: 30 min    Marijke Guadiana, [facility] Pager [phone]. If 7PM-7AM, please contact night-coverage at [email or web address], password [facility] 08/18/2012, 3:08 PM  LOS: 5 days

## 2012-08-18 NOTE — Progress Notes (Signed)
ANTICOAGULATION CONSULT NOTE - Initial Consult  Pharmacy Consult for Coumadin  Indication: h/o VTE  No Known Allergies  Patient Measurements: Height: 5\' 11"  (180.3 cm) Weight: 297 lb 6.4 oz (134.9 kg) IBW/kg (Calculated) : 75.3   Vital Signs: Temp: 98.1 F (36.7 C) (06/22 0617) Temp src: Oral (06/22 0617) BP: 121/79 mmHg (06/22 0617) Pulse Rate: 78 (06/22 0617)  Labs:  Recent Labs  08/16/12 0300  08/16/12 1100 08/16/12 1530 08/16/12 1700 08/17/12 0325 08/18/12 0605  HGB 6.6*  < >  --  7.9*  --  7.6* 7.9*  HCT 19.8*  < >  --  23.4*  --  23.1* 24.5*  PLT 147*  --   --   --   --  152 173  LABPROT 19.4*  --   --   --  16.9*  --   --   INR 1.70*  --   --   --  1.41  --   --   CREATININE 1.95*  --  1.94*  --   --  1.90* 1.93*  < > = values in this interval not displayed.  Estimated Creatinine Clearance: 61.3 ml/min (by C-G formula based on Cr of 1.93).   Medical History: Past Medical History  Diagnosis Date  . Sleep apnea     uses cpap  . Hypothyroidism   . Anemia   . Blood transfusion   . Chronic kidney disease     hx of kidney stones  . Hypertension   . Arthritis     osteoarthritis  . Pneumonia   . Diabetes mellitus     insulin dependent  . DVT (deep vein thrombosis) in pregnancy     Medications:  Scheduled:  . atorvastatin  40 mg Oral q1800  . Chlorhexidine Gluconate Cloth  6 each Topical Q0600  . ferrous sulfate  325 mg Oral BID WC  . fluconazole  100 mg Oral Daily  . insulin aspart  0-15 Units Subcutaneous TID WC  . insulin aspart  0-5 Units Subcutaneous QHS  . insulin glargine  10 Units Subcutaneous BID  . levothyroxine  200 mcg Oral QAC breakfast  . mupirocin ointment  1 application Nasal BID  . pantoprazole  40 mg Oral Q1200  . tamsulosin  0.4 mg Oral QPC supper  . vitamin C  250 mg Oral Daily   Infusions:    Assessment: 57 yo M known to pharmacy from antibiotic dosing.  Now asked to resume Coumadin for h/o DVT.  INR elevated on  admission, requiring reversal with FFP on June 18.  Now INR  1.41 as of 6/20.   Home Coumadin dose: 3/3/4.5 mg rotating >> INR 6.9 on admit.  Goal of Therapy:  INR 2-3 Monitor platelets by anticoagulation protocol: Yes   Plan:  - Coumadin 3 mg po x 1 - Daily INR - Dose cautiously with elevated INR on admit  Alek Borges L. Amada Jupiter, PharmD, [place] Clinical Pharmacist Pager: [phone] Pharmacy: [phone] 08/18/2012 4:07 PM

## 2012-08-19 LAB — PHOSPHORUS: Phosphorus: 2.5 mg/dL (ref 2.3–4.6)

## 2012-08-19 LAB — CBC
MCH: 30.1 pg (ref 26.0–34.0)
Platelets: 183 10*3/uL (ref 150–400)
RBC: 2.69 MIL/uL — ABNORMAL LOW (ref 4.22–5.81)
WBC: 7.6 10*3/uL (ref 4.0–10.5)

## 2012-08-19 LAB — BASIC METABOLIC PANEL
CO2: 26 mEq/L (ref 19–32)
Calcium: 8.1 mg/dL — ABNORMAL LOW (ref 8.4–10.5)
Chloride: 100 mEq/L (ref 96–112)
Sodium: 134 mEq/L — ABNORMAL LOW (ref 135–145)

## 2012-08-19 LAB — GLUCOSE, CAPILLARY
Glucose-Capillary: 197 mg/dL — ABNORMAL HIGH (ref 70–99)
Glucose-Capillary: 240 mg/dL — ABNORMAL HIGH (ref 70–99)
Glucose-Capillary: 209 mg/dL — ABNORMAL HIGH (ref 70–99)
Glucose-Capillary: 205 mg/dL — ABNORMAL HIGH (ref 70–99)

## 2012-08-19 LAB — PROTIME-INR
INR: 1.22 (ref 0.00–1.49)
Prothrombin Time: 15.2 seconds (ref 11.6–15.2)

## 2012-08-19 MED ORDER — WARFARIN SODIUM 3 MG PO TABS
3.0000 mg | ORAL_TABLET | Freq: Once | ORAL | Status: AC
  Administered 2012-08-19: 3 mg via ORAL
  Filled 2012-08-19: qty 1

## 2012-08-19 MED ORDER — LIDOCAINE HCL 2 % EX GEL
Freq: Three times a day (TID) | CUTANEOUS | Status: DC | PRN
  Filled 2012-08-19: qty 5

## 2012-08-19 MED ORDER — MIRABEGRON ER 25 MG PO TB24
25.0000 mg | ORAL_TABLET | Freq: Every day | ORAL | Status: DC
  Administered 2012-08-19 – 2012-08-21 (×3): 25 mg via ORAL
  Filled 2012-08-19 (×3): qty 1

## 2012-08-19 MED ORDER — LIDOCAINE HCL 2 % EX GEL
Freq: Two times a day (BID) | CUTANEOUS | Status: DC | PRN
  Administered 2012-08-19: 20 via URETHRAL
  Filled 2012-08-19: qty 20

## 2012-08-19 MED ORDER — INSULIN GLARGINE 100 UNIT/ML ~~LOC~~ SOLN
15.0000 [IU] | Freq: Two times a day (BID) | SUBCUTANEOUS | Status: DC
  Administered 2012-08-19 – 2012-08-20 (×2): 15 [IU] via SUBCUTANEOUS
  Filled 2012-08-19 (×3): qty 0.15

## 2012-08-19 NOTE — Consult Note (Signed)
WOC consult Note Reason for Consult: evaluation of the RLE ulceration.  Pt reports history of lymphedema and it is apparent he has history of venous stasis.  Hemosiderin staining of the RLE.  He reports he has compression stocking at home but has had trouble donning the compression stocking. Wound type: partial thickness skin ulcer pretibial Measurement: 7cm x 2.5cm x 0.2cm Wound [id number], appears that he may have had blister that ruptured and the skin has peeled away Drainage (amount, consistency, odor) yellow-serous, no odor Periwound:intact with hemosiderin staining circumferentially  Dressing procedure/placement/frequency: silicone foam for absorbency, change every other day.    Re consult if needed, will not follow at this time. Thanks  Eldana Isip Kellogg, [place] [phone])

## 2012-08-19 NOTE — Progress Notes (Signed)
[facility] PROGRESS NOTE  ZAMARIAN KWAK [id number] DOB: 1955/08/11 DOA: 08/13/2012 PCP: [name], MD  Summary: The patient is a 57 year old male who presented with flank pain and dysuria. He was found to have acute renal failure, hyperkalemia, anion gap metabolic acidosis, shock and ventilator dependent respiratory failure. He was admitted from June 8 to June 11 for nephrolithiasis, Klebsiella urinary tract infection, and renal failure. He had been taking NSAIDs at home for pain control.  He was intubated for acute respiratory failure secondary to renal failure and profound metabolic acidosis and admitted on June 17 to the ICU.  He was able to be extubated on the 19th.  Refractory shock secondary to sepsis due to pyelonephritis he was started on vasopressors on the 17th. They were able to be discontinued on the 18th.  For his renal failure, he underwent repeat CT scan which demonstrated bilateral renal calculi. There was increased edema and stranding around both ureters bilaterally. He also had mild dilation of both ureters compared to previous studies. There were no obstructing stones. Nephrology and urology were consulted.  He was started on CRRT on June 18 due to severe metabolic acidosis, uremia, hyperkalemia. This was discontinued on the 19th.  He has had more than 2 L of urine output daily and gradual improvement in his BUN and creatinine to his baseline.  Repeat CT scan on 6/20 demonstrated that he had passed a small stone which was in the left renal pelvis.  He was started on broad-spectrum antibiotics at admission. His urine cultures so far has been no growth final x2. He was diagnosed with a right lower extremity cellulitis superimposed on chronic changes and ulceration.    SIGNIFICANT EVENTS:  6/17 Admit with VDRF, ARF, shock, hyperkalemia, metabolic acidosis  [id number] Renal, urology consult, CRRT started  6/19 Off CRRT  6/20 Transfuse PRBC  6/21 Transfer to telemetry    STUDIES:  6/17 CT abdomen> 30mm stone in left renal pelvis with mild hydronephrosis, retroperitoneal adenopathy  6/20 CT abdomen> appears to have passed the small stone that was in the left renal pelvis   LINES / TUBES:  6/17 ETT >> 6/19  6/18 Rt IJ HD cath >>  6/18 Rt Radial Aline >> 6/20   CULTURES:  6/18 blood >> NGTD 6/18 urine >> negative  6/18 MRSA screen >> POSITIVE  6/20 Penis >>Yeast >>    Assessment/Plan  Acute respiratory failure due to renal failure and profound metabolic acidosis, resolved Refractory shock from urosepsis, resolved  Severe dysuria, possibly due to candidal UTI or candidal urethritis -  Spoke with [name] regarding clinical course and culture findings. Recommends treating for candidal infection as we cannot currently remove his Foley catheter -  Remove Foley catheter as soon as possible, can be removed when patient is able to get up and ambulate per urology -  Encourage ambulation out of bed -  Continue physical therapy -  Discontinue broad-spectrum antibiotics as all cultures have been negative to date -  Add on GC/Ch again, not previously done. -  Add lidocaine and myrbetriq for bladder spasms -  Repeat UA appears possibly infected.  Acute on chronic renal failure due to nephrolithiasis and hydronephrosis, ATN and NSAID use, back to baseline kidney function, CKD stage 3. -  Minimize nephrotoxins and renally dosed medications -  Appreciate nephrology assistance -  HD catheter has been removed  Pressure ulcer under left leg. -  Wound care consultation  Nephrolithiasis/urolithiasis due to uric acid stones, no current  obstruction -  Appreciate nephrology recommendations  Gross hematuria, resolving. Followup per his primary urologist [name]  BPH with urinary tract obstruction -  Continue Flomax  History of DVT. Anticoagulation was reversed with FFP on June 18.   -  Continue Coumadin  Normocytic anemia, likely due to marrow  suppression from acute illness, iron deficiency and renal parenchymal disease.   -  Hemoglobin stable -  Continue iron  Right leg cellulitis, appears to be resolved and now has chronic changes.  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  Again discussed depression. -  Offered anxiolytic or depression medication but patient continues to decline.  Chronic venous stasis with ulceration and chronic stasis dermatitis -  Wound care consult  Diet:  Diabetic Access:  PIV IVF:  Off Proph:  SCD  Code Status: full Family Communication: spoke with patient alone Disposition Plan: Social worker attempting SNF placement.   Consultants:  PCCM  Nephrology  Urology  ANTIBIOTICS:  6/17 vanc >> 6/22 6/17 ceftaz >> 6/22  6/22 fluconazole >>  HPI/Subjective:  Patient states he continues to have severe dysuria. The pain interferes with his ability to stand up.  Denies fevers, chills, nausea, vomiting, constipation.  Has suprapubic abdominal pain and bladder spasms.  Objective: Filed Vitals:   08/18/12 2232 08/19/12 0600 08/19/12 0922 08/19/12 1210  BP:  128/64 135/89   Pulse: 88 75 92   Temp:  98.6 F (37 C) 98.7 F (37.1 C)   TempSrc:  Oral Oral   Resp: 20 19 20    Height:      Weight:      SpO2: 98% 97% 96% 96%    Intake/Output Summary (Last 24 hours) at 08/19/12 1537 Last data filed at 08/19/12 1504  Gross per 24 hour  Intake   1100 ml  Output   5625 ml  Net  -4525 ml   Filed Weights   08/17/12 0500 08/17/12 2135 08/18/12 1924  Weight: 134.8 kg (297 lb 2.9 oz) 134.9 kg (297 lb 6.4 oz) 135.5 kg (298 lb 11.6 oz)     Exam:   General:  Obese caucasian male,  No acute distress, tearful affect  HEENT:  NCAT, MMM  Cardiovascular:  RRR, nl S1, S2 no mrg, 2+ pulses, warm extremities  Respiratory:  CTAB, no increased WOB  Abdomen:  NABS, soft, ND, TTP in suprapubic region without rebound or guarding.  MSK:  Normal tone and bulk, left leg amputation.  Neuro:  Grossly intact  GU:  Foley bag with a couple inches of milk white sediment.    Skin: Right lower extremity with chronic skin changes.  2 ulcers on the right shin without surrounding erythema or induration.    Data Reviewed: Basic Metabolic Panel:  Recent Labs Lab 08/14/12 0750  08/15/12 0350 08/16/12 0300 08/16/12 1100 08/17/12 0325 08/18/12 0605 08/19/12 0535  NA 139  < > 143 138 138 138 134* 134*  K 4.7  < > 3.0* 3.3* 3.2* 3.6 3.9 4.0  CL 110  < > 104 104 103 104 102 100  CO2 13*  < > 27 26 27 27 25  26  GLUCOSE 195*  < > 167* 216* 216* 201* 235* 196*  BUN 110*  < > 45* 37* 34* 32* 30* 28*  CREATININE 4.61*  < > 1.78* 1.95* 1.94* 1.90* 1.93* 1.85*  CALCIUM 8.4  < > 8.4 7.9* 7.7* 7.9* 7.8* 8.1*  MG 1.8  --  2.0  --   --   --   --   --   PHOS 5.3*  < > 1.4* 2.6 1.8* 2.0*  --  2.5  < > = values in this interval not displayed. Liver Function Tests:  Recent Labs Lab 08/13/12 2100  08/14/12 0830 08/14/12 1500 08/15/12 0350 08/16/12 0300 08/16/12 1100 08/17/12 0325  AST 15  --  10  --   --   --   --   --   ALT 20  --  13  --   --   --   --   --   ALKPHOS 119*  --  101  --   --   --   --   --   BILITOT 0.2*  --  0.2*  --   --   --   --   --   PROT 7.0  --  5.7*  --   --   --   --   --   ALBUMIN 2.8*  < > 2.4* 2.4* 2.4* 2.0* 2.0* 2.0*  < > = values in this interval not displayed.  Recent Labs Lab 08/13/12 2100  LIPASE 27   No results found for this basename: AMMONIA,  in the last 168 hours CBC:  Recent Labs Lab 08/13/12 2100  08/15/12 0350 08/16/12 0300 08/16/12 0400 08/16/12 1530 08/17/12 0325  08/18/12 0605 08/19/12 0535  WBC 6.4  < > 5.0 4.9  --   --  6.1 7.3 7.6  NEUTROABS 4.8  --   --   --   --   --   --   --   --   HGB 10.0*  < > 7.5* 6.6* 6.5* 7.9* 7.6* 7.9* 8.1*  HCT 30.6*  < > 22.0* 19.8* 19.0* 23.4* 23.1* 24.5* 24.8*  MCV 96.8  < > 89.8 91.7  --   --  91.7 93.2 92.2  PLT 273  < > 168 147*  --   --  152 173 183  < > = values in this interval not displayed. Cardiac Enzymes: No results found for this basename: CKTOTAL, CKMB, CKMBINDEX, TROPONINI,  in the last 168 hours BNP (last 3 results) No results found for this basename: PROBNP,  in the last 8760 hours CBG:  Recent Labs Lab 08/18/12 1216 08/18/12 1657 08/18/12 2044 08/19/12 0754 08/19/12 1227  GLUCAP 212* 265* 219* 197* 240*    Recent Results (from the past 240 hour(s))  URINE CULTURE     Status: None   Collection Time    08/13/12  8:19 PM      Result Value Range Status   Specimen Description URINE, CATHETERIZED   Final   Special Requests NONE   Final   Culture  Setup Time 08/14/2012 00:59   Final   Colony Count NO GROWTH   Final   Culture NO GROWTH   Final   Report Status 08/14/2012 FINAL   Final  MRSA PCR SCREENING     Status: Abnormal   Collection Time    08/14/12  1:28 AM      Result Value Range Status   MRSA by PCR POSITIVE (*) NEGATIVE Final   Comment:  The GeneXpert MRSA Assay (FDA     approved for NASAL specimens     only), is one component of a     comprehensive MRSA colonization     surveillance program. It is not     intended to diagnose MRSA     infection nor to guide or     monitor treatment for     MRSA infections.     RESULT CALLED TO, READ BACK BY AND VERIFIED WITH:     CALLED TO RN [facility] [phone] @0420  THANEY  CULTURE, BLOOD (ROUTINE X 2)     Status: None   Collection Time    08/14/12  2:50 AM      Result Value Range Status   Specimen Description BLOOD RIGHT HAND   Final   Special Requests BOTTLES DRAWN AEROBIC ONLY 6CC   Final   Culture  Setup Time  08/14/2012 10:52   Final   Culture     Final   Value:        BLOOD CULTURE RECEIVED NO GROWTH TO DATE CULTURE WILL BE HELD FOR 5 DAYS BEFORE ISSUING A FINAL NEGATIVE REPORT   Report Status PENDING   Incomplete  CULTURE, BLOOD (ROUTINE X 2)     Status: None   Collection Time    08/14/12  3:05 AM      Result Value Range Status   Specimen Description BLOOD RIGHT ARM   Final   Special Requests BOTTLES DRAWN AEROBIC ONLY 10CC   Final   Culture  Setup Time 08/14/2012 10:50   Final   Culture     Final   Value:        BLOOD CULTURE RECEIVED NO GROWTH TO DATE CULTURE WILL BE HELD FOR 5 DAYS BEFORE ISSUING A FINAL NEGATIVE REPORT   Report Status PENDING   Incomplete  WOUND CULTURE     Status: None   Collection Time    08/16/12 10:08 AM      Result Value Range Status   Specimen Description WOUND PENIS   Final   Special Requests NONE   Final   Gram Stain     Final   Value: NO WBC SEEN     MODERATE SQUAMOUS EPITHELIAL CELLS PRESENT     MODERATE YEAST   Culture MODERATE CANDIDA ALBICANS   Final   Report Status 08/18/2012 FINAL   Final  URINE CULTURE     Status: None   Collection Time    08/16/12 12:11 PM      Result Value Range Status   Specimen Description URINE, CATHETERIZED   Final   Special Requests Normal   Final   Culture  Setup Time 08/16/2012 12:50   Final   Colony Count NO GROWTH   Final   Culture NO GROWTH   Final   Report Status 08/17/2012 FINAL   Final     Studies: No results found.  Scheduled Meds: . atorvastatin  40 mg Oral q1800  . ferrous sulfate  325 mg Oral BID WC  . fluconazole  100 mg Oral Daily  . insulin aspart  0-15 Units Subcutaneous TID WC  . insulin aspart  0-5 Units Subcutaneous QHS  . insulin glargine  15 Units Subcutaneous BID  . levothyroxine  200 mcg Oral QAC breakfast  . mirabegron ER  25 mg Oral Daily  . pantoprazole  40 mg Oral Q1200  . tamsulosin  0.4 mg Oral QPC supper  . vitamin C  250 mg Oral Daily  .  warfarin  3 mg Oral ONCE-1800  .  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 If 7PM-7AM, please contact night-coverage at [email or web address], password [facility] 08/19/2012, 3:37 PM  LOS: 6 days

## 2012-08-19 NOTE — Progress Notes (Signed)
Physical Therapy Treatment Patient Details Name: [name] MRN: [id number] DOB: 1956/02/23 Today's Date: 08/19/2012 Time: [id number] PT Time Calculation (min): 34 min  PT Assessment / Plan / Recommendation Comments on Treatment Session  Pt continued to be limited by pain despite premed by RN.  He requires total assist for mobility and transfers at this point.  Recommend nuring use maximove for transfers to chair    Follow Up Recommendations  SNF     Does the patient have the potential to tolerate intense rehabilitation     Barriers to Discharge        Equipment Recommendations  None recommended by PT    Recommendations for Other Services OT consult  Frequency Min 2X/week   Plan Discharge plan remains appropriate;Frequency remains appropriate    Precautions / Restrictions Precautions Precautions: Fall Restrictions Weight Bearing Restrictions: No   Pertinent Vitals/Pain Pt had continual pain from foley catheter ~7-8/10 with any movement despite premed    Mobility  Bed Mobility Bed Mobility: Sitting - Scoot to Edge of Bed;Rolling Left;Left Sidelying to Sit Rolling Left: 3: Mod assist;With rail Left Sidelying to Sit: 1: +2 Total assist Left Sidelying to Sit: Patient Percentage: 30% Sitting - Scoot to Edge of Bed: 1: +2 Total assist Sitting - Scoot to Edge of Bed: Patient Percentage: 10% Details for Bed Mobility Assistance: Pt anxious with movment secondary to pain.    Pt neede much encouragement .He  did not assist much, but yelled out in pain with any mobility Transfers Transfers: Lateral/Scoot Transfers Lateral/Scoot Transfers: 1: +2 Total assist Details for Transfer Assistance: Pt needed total assist +2 (pt 10%) for sliding board to the bedside chair from the EOB.  Ambulation/Gait Ambulation/Gait Assistance: Not tested (comment)    Exercises General Exercises - Lower Extremity Quad Sets: AROM;Both;5 reps;Supine Gluteal Sets: AROM;Both;5 reps;Supine Hip  ABduction/ADduction: AAROM;Both;10 reps;Supine Hip Flexion/Marching: AROM;Both;10 reps;Supine   PT Diagnosis:    PT Problem List:   PT Treatment Interventions:     PT Goals Acute Rehab PT Goals PT Goal Formulation: With patient Time For Goal Achievement: 08/31/12 Potential to Achieve Goals: Fair Pt will go Supine/Side to Sit: with modified independence PT Goal: Supine/Side to Sit - Progress: Progressing toward goal Pt will Transfer Bed to Chair/Chair to Bed: with modified independence PT Transfer Goal: Bed to Chair/Chair to Bed - Progress: Progressing toward goal  Visit Information  Last PT Received On: 08/19/12 Assistance Needed: +2 PT/OT Co-Evaluation/Treatment: Yes    Subjective Data  Subjective: Pt continually talked about paint at foley  catheter site with any movement of legs or body Patient Stated Goal: to get rid of the pain so that he can do better   Cognition  Cognition Arousal/Alertness: Awake/alert Behavior During Therapy: Anxious Overall Cognitive Status: No family/caregiver present to determine baseline cognitive functioning (pt needed frequent redirection to stay on task) Difficult to assess due to:  (Pt anxious about pain)    Balance  Static Sitting Balance Static Sitting - Balance Support: Right upper extremity supported;Left upper extremity supported Static Sitting - Level of Assistance: 5: Stand by assistance Static Sitting - Comment/# of Minutes: Pt sat EOB for approximately 7-8 mins with close supervision.  End of Session PT - End of Session Activity Tolerance: Patient limited by pain;Other (comment) (Exteme anxiety) Patient left: with call bell/phone within reach;in chair Nurse Communication: Mobility status   GP    Donato Heinz. [place], [place] 08/19/2012, 1:18 PM

## 2012-08-19 NOTE — Progress Notes (Signed)
Pt places self CPAP with nasal mask. Settings of 20 cmH2O per what pt wears at home with 2 LPM.

## 2012-08-19 NOTE — Progress Notes (Signed)
ANTICOAGULATION CONSULT NOTE - Follow Up Consult  Pharmacy Consult for Coumadin Indication: history of VTE  No Known Allergies  Patient Measurements: Height: 5\' 11"  (180.3 cm) Weight: 298 lb 11.6 oz (135.5 kg) IBW/kg (Calculated) : 75.3  Vital Signs: Temp: 98.7 F (37.1 C) (06/23 0922) Temp src: Oral (06/23 0922) BP: 135/89 mmHg (06/23 0922) Pulse Rate: 92 (06/23 0922)  Labs:  Recent Labs  08/16/12 1700 08/17/12 0325 08/18/12 0605 08/19/12 0535  HGB  --  7.6* 7.9* 8.1*  HCT  --  23.1* 24.5* 24.8*  PLT  --  152 173 183  LABPROT 16.9*  --   --  15.2  INR 1.41  --   --  1.22  CREATININE  --  1.90* 1.93* 1.85*    Estimated Creatinine Clearance: 64.2 ml/min (by C-G formula based on Cr of 1.85).   Medications:  Scheduled:  . atorvastatin  40 mg Oral q1800  . ferrous sulfate  325 mg Oral BID WC  . fluconazole  100 mg Oral Daily  . insulin aspart  0-15 Units Subcutaneous TID WC  . insulin aspart  0-5 Units Subcutaneous QHS  . insulin glargine  10 Units Subcutaneous BID  . levothyroxine  200 mcg Oral QAC breakfast  . pantoprazole  40 mg Oral Q1200  . tamsulosin  0.4 mg Oral QPC supper  . vitamin C  250 mg Oral Daily  . 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  His Coumadin was held on admission 6/17 and reversed 6/18 due to supratherapeutic INR and hematuria.  Coumadin was restarted on 6/22 and his INR remains subtherapeutic as would be expected at this point.  Note he is also on fluconazole which can increase his sensitivity to Coumadin.  Goal of Therapy:  INR 2-3   Plan:  Coumadin 3mg  today Daily PT/INR  Legrand Como, Pharm.D., BCPS Clinical Pharmacist Phone: [phone] or [phone] Pager: [phone] 08/19/2012, 10:23 AM

## 2012-08-19 NOTE — Progress Notes (Signed)
Inpatient Diabetes Program Recommendations  AACE/ADA: New Consensus Statement on Inpatient Glycemic Control (2013)  Target Ranges:  Prepandial:   less than 140 mg/dL      Peak postprandial:   less than 180 mg/dL (1-2 hours)      Critically ill patients:  140 - 180 mg/dL     Results for CORIN, CUZZORT (MRN [id number]) as of 08/19/2012 13:18  Ref. Range 08/18/2012 07:55 08/18/2012 12:16 08/18/2012 16:57 08/18/2012 20:44  Glucose-Capillary Latest Range: 70-99 mg/dL 210 (H) 212 (H) 265 (H) 219 (H)    Results for CHESTERFIELD, MERCADEL (MRN [id number]) as of 08/19/2012 13:18  Ref. Range 08/19/2012 07:54 08/19/2012 12:27  Glucose-Capillary Latest Range: 70-99 mg/dL 197 (H) 240 (H)    Patient still having elevated fasting glucose levels and elevated postprandial glucose levels.  Recommend the following in-hospital insulin adjustments:  1. Increase Lantus to 12 units bid 2. Add scheduled Novolog meal coverage- Novolog 4 units tid with meals   Will follow. Wyn Quaker RN, MSN, CDE Diabetes Coordinator Inpatient Diabetes Program [phone]

## 2012-08-19 NOTE — Evaluation (Signed)
Occupational Therapy Evaluation Patient Details Name: [name] MRN: [id number] DOB: 1955/10/01 Today's Date: 08/19/2012 Time: [id number] OT Time Calculation (min): 35 min  OT Assessment / Plan / Recommendation Clinical Impression  Pt is a 57 yr old male with extensive hospital history.  Pt admitted June 17th with acute respiratory failure, sepsis, and renal failure.  Currently needs total assist +2 for all mobility and sliding board transfers.  Pt's biggest limitation is pain in his groin area which is affecting his abilit to tolerate sitting or transfers and is also making him extremely anxious.  Feel he will benefit from acute care OT to help increase independence but based on current assist level and his wife working during the day, will likely need SNF for follow-up therapy.      OT Assessment  Patient needs continued OT Services    Follow Up Recommendations  SNF       Equipment Recommendations  None recommended by OT       Frequency  Min 2X/week    Precautions / Restrictions Precautions Precautions: Fall Restrictions Weight Bearing Restrictions: No   Pertinent Vitals/Pain Pain 8/10 in his groin, meds given prior to session and pt repositioned in bedside chair    ADL  Eating/Feeding: Simulated;Independent Where Assessed - Eating/Feeding: Chair Grooming: Simulated;Set up Where Assessed - Grooming: Supported sitting Upper Body Bathing: Simulated;Set up Where Assessed - Upper Body Bathing: Supported sitting Lower Body Bathing: Simulated;Maximal assistance Where Assessed - Lower Body Bathing: Unsupported sitting Upper Body Dressing: Simulated;Set up Where Assessed - Upper Body Dressing: Unsupported sitting Lower Body Dressing: Simulated;Maximal assistance Where Assessed - Lower Body Dressing: Unsupported sitting Toilet Transfer: Simulated;+2 Total assistance Toilet Transfer: Patient Percentage: 10% Toilet Transfer Method: Other (comment) (sliding board to bedside  chair) Toilet Transfer Equipment: Other (comment) (to bedside chair) Toileting - Clothing Manipulation and Hygiene: Simulated;+2 Total assistance Toileting - Clothing Manipulation and Hygiene: Patient Percentage: 10% Where Assessed - Toileting Clothing Manipulation and Hygiene: Other (comment) (sitting on EOB) Transfers/Ambulation Related to ADLs: Pt needed total assist +2 (pt 10%) for sliding board transfer from bed to bedside chair. ADL Comments: Pt limited by perseveration on groin pain.  In sitting pt with increased pain at penis/urethra.  Meds gvien prior to session to help pt cope.  Needs lots of encouragement and distraction to participate.  Able to tolerate sitting EOB for 7-8 mins before transferring to bedside chair.      OT Diagnosis: Generalized weakness;Acute pain  OT Problem List: Decreased strength;Impaired balance (sitting and/or standing);Pain;Decreased activity tolerance OT Treatment Interventions: Self-care/ADL training;Therapeutic exercise;Patient/family education;Balance training;Therapeutic activities;DME and/or AE instruction   OT Goals Acute Rehab OT Goals OT Goal Formulation: With patient Time For Goal Achievement: 09/02/12 Potential to Achieve Goals: Good ADL Goals Pt Will Perform Lower Body Bathing: with mod assist;Sitting, edge of bed;Other (comment) (lateral leans for peri area) ADL Goal: Lower Body Bathing - Progress: Goal set today Pt Will Perform Lower Body Dressing: with mod assist;Sitting, bed;Other (comment) (with lateral leans for peri area) ADL Goal: Lower Body Dressing - Progress: Goal set today Pt Will Transfer to Toilet: with max assist;Squat pivot transfer;Drop arm 3-in-1 ADL Goal: Toilet Transfer - Progress: Goal set today Pt Will Perform Toileting - Hygiene: with min assist;Sitting on 3-in-1 or toilet ADL Goal: Toileting - Hygiene - Progress: Goal set today Miscellaneous OT Goals Miscellaneous OT Goal #1: Pt will transfer supine to sit with min  assist in preparation for selfcare tasks. OT Goal: Miscellaneous Goal #1 - Progress:  Goal set today Miscellaneous OT Goal #2: Pt will maintain sitting EOB with close supervision for 20 mins during bathing and dressing tasks. OT Goal: Miscellaneous Goal #2 - Progress: Goal set today  Visit Information  Last OT Received On: 08/19/12 Assistance Needed: +2    Subjective Data  Subjective: I can't do it, it just hurts too much. Patient Stated Goal: Pt wants his pain to be better.   Prior Functioning     Home Living Lives With: Spouse Available Help at Discharge: Family Type of Home: Apartment Home Access: Level entry Home Layout: One level Home Adaptive Equipment: Bedside commode/3-in-1;Walker - rolling;Wheelchair - manual Prior Function Level of Independence: Independent with assistive device(s) (states independent with transfers) Able to Take Stairs?: No Driving: No Vocation: On disability Communication Communication: No difficulties Dominant Hand: Right         Vision/Perception Vision - History Baseline Vision: Wears glasses all the time Patient Visual Report: No change from baseline Vision - Assessment Eye Alignment: Within Functional Limits Perception Perception: Not tested Praxis Praxis: Not tested   Cognition  Cognition Arousal/Alertness: Awake/alert Behavior During Therapy: Anxious Overall Cognitive Status: Difficult to assess Difficult to assess due to:  (Pt anxious about pain)    Extremity/Trunk Assessment Right Upper Extremity Assessment RUE ROM/Strength/Tone: WFL for tasks assessed RUE Sensation: WFL - Light Touch RUE Coordination: WFL - gross/fine motor Left Upper Extremity Assessment LUE ROM/Strength/Tone: WFL for tasks assessed LUE Sensation: WFL - Light Touch LUE Coordination: WFL - gross/fine motor     Mobility Bed Mobility Bed Mobility: Sitting - Scoot to Edge of Bed;Rolling Left;Left Sidelying to Sit Rolling Left: 3: Mod assist;With  rail Left Sidelying to Sit: 1: +2 Total assist Left Sidelying to Sit: Patient Percentage: 30% Sitting - Scoot to Edge of Bed: 1: +2 Total assist Sitting - Scoot to Edge of Bed: Patient Percentage: 10% Details for Bed Mobility Assistance: Pt anxious with movment secondary to pain.  Did not assist much with any aspects of mobility except rolling to the left side.  Pt yelling out in pain during movement as well. Transfers Details for Transfer Assistance: Pt needed total assist +2 (pt 10%) for sliding board to the bedside chair from the EOB.         Balance Static Sitting Balance Static Sitting - Balance Support: Right upper extremity supported;Left upper extremity supported Static Sitting - Level of Assistance: 5: Stand by assistance Static Sitting - Comment/# of Minutes: Pt sat EOB for approximately 7-8 mins with close supervision.   End of Session OT - End of Session Equipment Utilized During Treatment: Sliding board Activity Tolerance: Other (comment);Patient limited by pain Patient left: in chair;with call bell/phone within reach Nurse Communication: Need for lift equipment     [place] OTR/L Pager number [phone] 08/19/2012, 12:39 PM

## 2012-08-19 NOTE — Progress Notes (Signed)
First visit with pt as per charge nurse's referral. Pt presented with a flat affect and invited me to enter after I introduced myself and my role. Pt discussed his history of grief and loss, and the deaths of his family members from his family-of-origin within a short span of time. Pt discussed the medical complications he had been experiencing for the past 2.5 years. Pt described feelings of frustration stemming from his inability to get started on rehabilitation as he is still experiencing other medical difficulties. Pt described a wide range of interests including sport and music, and how is used to be very socially active. Pt stated that he cannot wait to get involved in social activities again. Pt stated that he enjoys connecting with people on social media, and prefers to meet people physically like he used to. Pt stated that I could visit with him again tomorrow.   Elmarie Shiley Counselor Intern Erling Cruz

## 2012-08-19 NOTE — Clinical Social Work Note (Signed)
CSW received consult regarding SNF placement for patient. Was advised by MD that patient request CSW to talk with his wife about discharge plans. Call made to Mrs. Seyler (after 6 pm) and message left. CSW will attempt to reach wife on 6/24.  Mckinleigh Schuchart Givens, MSW, LCSW [phone]

## 2012-08-20 LAB — URINE CULTURE: Colony Count: NO GROWTH

## 2012-08-20 LAB — CULTURE, BLOOD (ROUTINE X 2): Culture: NO GROWTH

## 2012-08-20 LAB — GLUCOSE, CAPILLARY
Glucose-Capillary: 188 mg/dL — ABNORMAL HIGH (ref 70–99)
Glucose-Capillary: 233 mg/dL — ABNORMAL HIGH (ref 70–99)

## 2012-08-20 LAB — PROTIME-INR: INR: 1.31 (ref 0.00–1.49)

## 2012-08-20 MED ORDER — OXYCODONE HCL 5 MG PO TABS
15.0000 mg | ORAL_TABLET | ORAL | Status: DC | PRN
  Administered 2012-08-20 – 2012-08-21 (×5): 15 mg via ORAL
  Filled 2012-08-20 (×5): qty 3

## 2012-08-20 MED ORDER — FENTANYL CITRATE 0.05 MG/ML IJ SOLN: 25.0000 ug | INTRAMUSCULAR | Status: DC | PRN

## 2012-08-20 MED ORDER — WARFARIN SODIUM 3 MG PO TABS
3.0000 mg | ORAL_TABLET | Freq: Once | ORAL | Status: AC
  Administered 2012-08-20: 3 mg via ORAL
  Filled 2012-08-20: qty 1

## 2012-08-20 MED ORDER — INSULIN GLARGINE 100 UNIT/ML ~~LOC~~ SOLN
20.0000 [IU] | Freq: Two times a day (BID) | SUBCUTANEOUS | Status: DC
  Administered 2012-08-20 – 2012-08-21 (×2): 20 [IU] via SUBCUTANEOUS
  Filled 2012-08-20 (×3): qty 0.2

## 2012-08-20 MED ORDER — ENOXAPARIN SODIUM 150 MG/ML ~~LOC~~ SOLN
130.0000 mg | Freq: Two times a day (BID) | SUBCUTANEOUS | Status: DC
  Filled 2012-08-20 (×2): qty 1

## 2012-08-20 NOTE — Progress Notes (Signed)
[facility] PROGRESS NOTE  [name] [id number] DOB: Aug 07, 1955 DOA: 08/13/2012 PCP: [name], MD  Summary: The patient is a 57 year old male who presented with flank pain and dysuria. He was found to have acute renal failure, hyperkalemia, anion gap metabolic acidosis, shock and ventilator dependent respiratory failure. He was admitted from June 8 to June 11 for nephrolithiasis, Klebsiella urinary tract infection, and renal failure. He had been taking NSAIDs at home for pain control.  He was intubated for acute respiratory failure secondary to renal failure and profound metabolic acidosis and admitted on June 17 to the ICU.  He was able to be extubated on the 19th.  Refractory shock secondary to sepsis due to pyelonephritis he was started on vasopressors on the 17th. They were able to be discontinued on the 18th.  For his renal failure, he underwent repeat CT scan which demonstrated bilateral renal calculi. There was increased edema and stranding around both ureters bilaterally. He also had mild dilation of both ureters compared to previous studies. There were no obstructing stones. Nephrology and urology were consulted.  He was started on CRRT on June 18 due to severe metabolic acidosis, uremia, hyperkalemia. This was discontinued on the 19th.  He has had more than 2 L of urine output daily and gradual improvement in his BUN and creatinine to his baseline.  Repeat CT scan on 6/20 demonstrated that he had passed a small stone which was in the left renal pelvis.  He was started on broad-spectrum antibiotics at admission. His urine cultures so far has been no growth final x2. He was diagnosed with a right lower extremity cellulitis superimposed on chronic changes and ulceration.    SIGNIFICANT EVENTS:  6/17 Admit with VDRF, ARF, shock, hyperkalemia, metabolic acidosis  [id number] Renal, urology consult, CRRT started  6/19 Off CRRT  6/20 Transfuse PRBC  6/21 Transfer to telemetry    STUDIES:  6/17 CT abdomen> 66mm stone in left renal pelvis with mild hydronephrosis, retroperitoneal adenopathy  6/20 CT abdomen> appears to have passed the small stone that was in the left renal pelvis   LINES / TUBES:  6/17 ETT >> 6/19  6/18 Rt IJ HD cath >>  6/18 Rt Radial Aline >> 6/20   CULTURES:  6/18 blood >> NGTD 6/18 urine >> negative  6/18 MRSA screen >> POSITIVE  6/20 Penis >>Yeast >>    Assessment/Plan  Acute respiratory failure due to renal failure and profound metabolic acidosis, resolved -  Refractory shock from urosepsis, resolved  Severe dysuria, possibly due to candidal UTI or candidal urethritis -  Spoke with [name] regarding clinical course and culture findings. Recommends treating for candidal infection as we cannot currently remove his Foley catheter -  Remove Foley catheter when patient is able to get up and ambulate per urology -  Encourage ambulation out of bed -  Continue physical therapy -  GC/Ch PCR -  Continue lidocaine and myrbetriq for bladder spasms -  Patient encouraged to decrease his dependence on IV narcotics for his pain -  Increase oxycodone -  Avoid long-acting narcotics in this patient who has frequent intermittent acute kidney injury. -  Decreased the total dose and frequency of fentanyl  Acute on chronic renal failure due to nephrolithiasis and hydronephrosis, ATN and NSAID use, back to baseline kidney function, CKD stage 3. -  Minimize nephrotoxins and renally dosed medications -  Appreciate nephrology assistance -  HD catheter has been removed  Pressure ulcer under left leg. -  Wound care consultation  Nephrolithiasis/urolithiasis due to uric acid stones, no current obstruction -  Appreciate nephrology recommendations  Gross hematuria, resolving. Followup per his primary urologist 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 Anticoagulation was reversed with  FFP on June 18.   -  Continue Coumadin, INR subtherapeutic -  No need to bridge   Normocytic anemia, likely due to marrow suppression from acute illness, iron deficiency and renal parenchymal disease.   -  Hemoglobin stable -  Continue iron  Right leg cellulitis, appears to be resolved and now has chronic changes.  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  Again discussed depression. -  Offered anxiolytic or depression medication but patient continues to decline.  Chronic venous stasis with ulceration and chronic stasis dermatitis -  Wound care consult  Diet:  Diabetic Access:  PIV IVF:  Off Proph:  SCD  Code Status: full Family Communication: spoke with patient alone Disposition Plan: Social worker attempting SNF placement.   Consultants:  PCCM  Nephrology  Urology  ANTIBIOTICS:  6/17 vanc >> 6/22 6/17 ceftaz >> 6/22  6/22 fluconazole >>  HPI/Subjective:  Patient states he continues to have severe dysuria. The pain interferes with his ability to stand up.  Denies fevers, chills, nausea, vomiting, constipation. Abdominal pain has improved somewhat  Objective: Filed Vitals:   08/19/12 2149 08/20/12 0507 08/20/12 0959 08/20/12 1400  BP: 122/77 120/78 114/74 118/70  Pulse: 86 77 75 70  Temp: 97.9 F (36.6 C) 97.9 F (36.6 C) 98 F (36.7 C) 97.4 F (36.3 C)  TempSrc: Oral Oral Oral Oral  Resp: 18 18 18 18   Height:      Weight: 132.087 kg (291 lb 3.2 oz)     SpO2: 92% 96% 99% 98%    Intake/Output Summary (Last 24 hours) at 08/20/12 1751 Last data filed at 08/20/12 1626  Gross per 24 hour  Intake     480 ml  Output   4100 ml  Net  -3620 ml   Filed Weights   08/17/12 2135 08/18/12 1924 08/19/12 2149  Weight: 134.9 kg (297 lb 6.4 oz) 135.5 kg (298 lb 11.6 oz) 132.087 kg (291 lb 3.2 oz)    Exam:   General:  Obese caucasian male,  No acute distress, less tearful today  HEENT:  NCAT, MMM  Cardiovascular:  RRR, nl S1, S2 no mrg, 2+ pulses, warm extremities  Respiratory:  CTAB, no increased WOB  Abdomen:  NABS, soft, ND, TTP in suprapubic region without rebound or guarding.  MSK:  Normal tone and bulk, left leg amputation.  Neuro:  Grossly intact  GU:  Foley bag with a couple inches of milk white sediment.    Skin: Right lower extremity with chronic skin changes.  2 ulcers on the right shin without surrounding erythema or induration.  Left posterior leg with stage 2 ulcer  Data Reviewed: Basic Metabolic Panel:  Recent Labs Lab 08/14/12 0750  08/15/12 0350 08/16/12 0300 08/16/12 1100 08/17/12 0325 08/18/12 0605 08/19/12 0535  NA 139  < > 143 138  138 138 134* 134*  K 4.7  < > 3.0* 3.3* 3.2* 3.6 3.9 4.0  CL 110  < > 104 104 103 104 102 100  CO2 13*  < > 27 26 27 27 25 26   GLUCOSE 195*  < > 167* 216* 216* 201* 235* 196*  BUN 110*  < > 45* 37* 34* 32* 30* 28*  CREATININE 4.61*  < > 1.78* 1.95* 1.94* 1.90* 1.93* 1.85*  CALCIUM 8.4  < > 8.4 7.9* 7.7* 7.9* 7.8* 8.1*  MG 1.8  --  2.0  --   --   --   --   --   PHOS 5.3*  < > 1.4* 2.6 1.8* 2.0*  --  2.5  < > = values in this interval not displayed. Liver Function Tests:  Recent Labs Lab 08/13/12 2100  08/14/12 0830 08/14/12 1500 08/15/12 0350 08/16/12 0300 08/16/12 1100 08/17/12 0325  AST 15  --  10  --   --   --   --   --   ALT 20  --  13  --   --   --   --   --   ALKPHOS 119*  --  101  --   --   --   --   --   BILITOT 0.2*  --  0.2*  --   --   --   --   --   PROT 7.0  --  5.7*  --   --   --   --   --   ALBUMIN 2.8*  < > 2.4* 2.4* 2.4* 2.0* 2.0* 2.0*  < > = values in this interval not displayed.  Recent  Labs Lab 08/13/12 2100  LIPASE 27   No results found for this basename: AMMONIA,  in the last 168 hours CBC:  Recent Labs Lab 08/13/12 2100  08/15/12 0350 08/16/12 0300 08/16/12 0400 08/16/12 1530 08/17/12 0325 08/18/12 0605 08/19/12 0535  WBC 6.4  < > 5.0 4.9  --   --  6.1 7.3 7.6  NEUTROABS 4.8  --   --   --   --   --   --   --   --   HGB 10.0*  < > 7.5* 6.6* 6.5* 7.9* 7.6* 7.9* 8.1*  HCT 30.6*  < > 22.0* 19.8* 19.0* 23.4* 23.1* 24.5* 24.8*  MCV 96.8  < > 89.8 91.7  --   --  91.7 93.2 92.2  PLT 273  < > 168 147*  --   --  152 173 183  < > = values in this interval not displayed. Cardiac Enzymes: No results found for this basename: CKTOTAL, CKMB, CKMBINDEX, TROPONINI,  in the last 168 hours BNP (last 3 results) No results found for this basename: PROBNP,  in the last 8760 hours CBG:  Recent Labs Lab 08/19/12 1730 08/19/12 2147 08/20/12 0750 08/20/12 1143 08/20/12 1619  GLUCAP 209* 205* 197* 245* 188*    Recent Results (from the past 240 hour(s))  URINE CULTURE     Status: None   Collection Time    08/13/12  8:19 PM      Result Value Range Status   Specimen Description URINE, CATHETERIZED   Final   Special Requests NONE   Final   Culture  Setup Time 08/14/2012 00:59   Final   Colony Count NO GROWTH   Final   Culture NO GROWTH   Final   Report Status 08/14/2012 FINAL   Final  MRSA PCR SCREENING     Status: Abnormal   Collection Time    08/14/12  1:28 AM      Result Value Range Status   MRSA by PCR POSITIVE (*) NEGATIVE Final   Comment:            The GeneXpert MRSA Assay (FDA     approved for NASAL specimens     only), is one component of a     comprehensive MRSA colonization     surveillance program. It is not     intended to diagnose MRSA     infection nor to guide or     monitor treatment for     MRSA infections.     RESULT CALLED TO, READ BACK BY AND VERIFIED WITH:     CALLED TO RN JAMES ARTIS [phone] @0420  THANEY  CULTURE, BLOOD (ROUTINE X 2)      Status: None   Collection Time    08/14/12  2:50 AM      Result Value Range Status   Specimen Description BLOOD RIGHT HAND   Final   Special Requests BOTTLES DRAWN AEROBIC ONLY [facility]   Final   Culture  Setup Time 08/14/2012 10:52   Final   Culture NO GROWTH 5 DAYS   Final   Report Status 08/20/2012 FINAL   Final  CULTURE, BLOOD (ROUTINE X 2)     Status: None   Collection Time    08/14/12  3:05 AM      Result Value Range Status   Specimen Description BLOOD RIGHT ARM   Final   Special Requests BOTTLES DRAWN AEROBIC ONLY [facility]   Final   Culture  Setup Time 08/14/2012 10:50   Final   Culture NO GROWTH 5 DAYS   Final   Report Status 08/20/2012 FINAL   Final  WOUND CULTURE     Status: None   Collection Time    08/16/12 10:08 AM      Result Value Range Status   Specimen Description WOUND PENIS   Final   Special Requests NONE   Final   Gram Stain     Final   Value: NO WBC SEEN     MODERATE SQUAMOUS EPITHELIAL CELLS PRESENT     MODERATE YEAST   Culture MODERATE CANDIDA ALBICANS   Final   Report Status 08/18/2012 FINAL   Final  URINE CULTURE     Status: None   Collection Time    08/16/12 12:11 PM      Result Value Range Status   Specimen Description URINE, CATHETERIZED   Final   Special Requests Normal   Final   Culture  Setup Time 08/16/2012 12:50   Final   Colony Count NO GROWTH   Final   Culture NO GROWTH   Final   Report Status 08/17/2012 FINAL   Final  URINE CULTURE     Status: None   Collection Time    08/18/12  6:16 PM      Result Value Range Status   Specimen Description URINE, RANDOM   Final   Special Requests NONE   Final   Culture  Setup Time 08/19/2012 00:17   Final   Colony Count NO GROWTH   Final   Culture NO GROWTH   Final   Report Status 08/20/2012 FINAL   Final     Studies: No results found.  Scheduled Meds: . atorvastatin  40 mg Oral q1800  . ferrous sulfate  325 mg Oral BID  WC  . fluconazole  100 mg Oral Daily  . insulin aspart  0-15 Units  Subcutaneous TID WC  . insulin aspart  0-5 Units Subcutaneous QHS  . insulin glargine  20 Units Subcutaneous BID  . levothyroxine  200 mcg Oral QAC breakfast  . mirabegron ER  25 mg Oral Daily  . pantoprazole  40 mg Oral Q1200  . tamsulosin  0.4 mg Oral QPC supper  . vitamin C  250 mg Oral Daily  . warfarin  3 mg Oral ONCE-1800  . 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 If 7PM-7AM, please contact night-coverage at [email or web address], password [facility] 08/20/2012, 5:51 PM  LOS: 7 days

## 2012-08-20 NOTE — Progress Notes (Signed)
Subjective: Patient reports that he is still having bladder spasms. He feels that they're getting better, and the catheter is not as bothersome. He looks to be well rested and comfortable.  I am told by the nurse who covered him overnight that he has been using/asking for a lot of fentanyl. He states that he prefershydromorphone. According to the nurse,he will appear comfortable but still asked for his pain medicine.  Objective: Vital signs in last 24 hours: Temp:  [97.9 F (36.6 C)-98.1 F (36.7 C)] 98 F (36.7 C) (06/24 0959) Pulse Rate:  [75-86] 75 (06/24 0959) Resp:  [18] 18 (06/24 0959) BP: (113-122)/(73-78) 114/74 mmHg (06/24 0959) SpO2:  [92 %-99 %] 99 % (06/24 0959) Weight:  [291 lb 3.2 oz (132.087 kg)] 291 lb 3.2 oz (132.087 kg) (06/23 2149)  Intake/Output from previous day: 06/23 0701 - 06/24 0700 In: -  Out: [id number] [Urine:5125] Intake/Output this shift: Total I/O In: 240 [P.O.:240] Out: 1250 [Urine:1250]  Physical Exam:  Constitutional: Vital signs reviewed. WD WN in NAD   Eyes: PERRL, No scleral icterus.   Genitourinary:phallus obscured in the suprapubic fat Extremities: left BKA. Decreased peripheral edema   Lab Results:  Recent Labs  08/18/12 0605 08/19/12 0535  HGB 7.9* 8.1*  HCT 24.5* 24.8*   BMET  Recent Labs  08/18/12 0605 08/19/12 0535  NA 134* 134*  K 3.9 4.0  CL 102 100  CO2 25 26  GLUCOSE 235* 196*  BUN 30* 28*  CREATININE 1.93* 1.85*  CALCIUM 7.8* 8.1*    Recent Labs  08/19/12 0535 08/20/12 0457  INR 1.22 1.31   No results found for this basename: LABURIN,  in the last 72 hours Results for orders placed during the hospital encounter of 08/13/12  URINE CULTURE     Status: None   Collection Time    08/13/12  8:19 PM      Result Value Range Status   Specimen Description URINE, CATHETERIZED   Final   Special Requests NONE   Final   Culture  Setup Time 08/14/2012 00:59   Final   Colony Count NO GROWTH   Final   Culture NO  GROWTH   Final   Report Status 08/14/2012 FINAL   Final  MRSA PCR SCREENING     Status: Abnormal   Collection Time    08/14/12  1:28 AM      Result Value Range Status   MRSA by PCR POSITIVE (*) NEGATIVE Final   Comment:            The GeneXpert MRSA Assay (FDA     approved for NASAL specimens     only), is one component of a     comprehensive MRSA colonization     surveillance program. It is not     intended to diagnose MRSA     infection nor to guide or     monitor treatment for     MRSA infections.     RESULT CALLED TO, READ BACK BY AND VERIFIED WITH:     CALLED TO RN JAMES ARTIS [phone] @0420  THANEY  CULTURE, BLOOD (ROUTINE X 2)     Status: None   Collection Time    08/14/12  2:50 AM      Result Value Range Status   Specimen Description BLOOD RIGHT HAND   Final   Special Requests BOTTLES DRAWN AEROBIC ONLY [facility]   Final   Culture  Setup Time 08/14/2012 10:52   Final   Culture NO  GROWTH 5 DAYS   Final   Report Status 08/20/2012 FINAL   Final  CULTURE, BLOOD (ROUTINE X 2)     Status: None   Collection Time    08/14/12  3:05 AM      Result Value Range Status   Specimen Description BLOOD RIGHT ARM   Final   Special Requests BOTTLES DRAWN AEROBIC ONLY [facility]   Final   Culture  Setup Time 08/14/2012 10:50   Final   Culture NO GROWTH 5 DAYS   Final   Report Status 08/20/2012 FINAL   Final  WOUND CULTURE     Status: None   Collection Time    08/16/12 10:08 AM      Result Value Range Status   Specimen Description WOUND PENIS   Final   Special Requests NONE   Final   Gram Stain     Final   Value: NO WBC SEEN     MODERATE SQUAMOUS EPITHELIAL CELLS PRESENT     MODERATE YEAST   Culture MODERATE CANDIDA ALBICANS   Final   Report Status 08/18/2012 FINAL   Final  URINE CULTURE     Status: None   Collection Time    08/16/12 12:11 PM      Result Value Range Status   Specimen Description URINE, CATHETERIZED   Final   Special Requests Normal   Final   Culture  Setup Time 08/16/2012  12:50   Final   Colony Count NO GROWTH   Final   Culture NO GROWTH   Final   Report Status 08/17/2012 FINAL   Final  URINE CULTURE     Status: None   Collection Time    08/18/12  6:16 PM      Result Value Range Status   Specimen Description URINE, RANDOM   Final   Special Requests NONE   Final   Culture  Setup Time 08/19/2012 00:17   Final   Colony Count NO GROWTH   Final   Culture NO GROWTH   Final   Report Status 08/20/2012 FINAL   Final    Studies/Results: No results found.  Assessment/Plan:   1. Probable/possible past ureteral calculi. Most recent CT scan revealed no evident hydronephrosis. He urgently needs a 24-hour urine collection to assess causative factors of urolithiasis  2. Acute renal insufficiency on top of chronic renal insufficiency, resolving  3. Recent UTI, most recent culturenegative  4. Significantly high narcotic use  I spoke with [name] today. I suggested that he decrease his use of the fentanyl. I think that this is out of proportion with his symptoms.  I would recommend rehabilitation bed for this gentleman who has obvious physical limitations.  I also gave the patient a request for 24 hour urine collection should he go home-he would call to get this kit sent to his house, perform it and send it back in.   LOS: 7 days   Franchot Gallo M 08/20/2012, 1:17 PM

## 2012-08-20 NOTE — Progress Notes (Signed)
ANTICOAGULATION CONSULT NOTE - Follow Up Consult  Pharmacy Consult for Coumadin Indication: history of VTE  No Known Allergies  Patient Measurements: Height: 5\' 11"  (180.3 cm) Weight: 291 lb 3.2 oz (132.087 kg) IBW/kg (Calculated) : 75.3  Vital Signs: Temp: 97.9 F (36.6 C) (06/24 0507) Temp src: Oral (06/24 0507) BP: 120/78 mmHg (06/24 0507) Pulse Rate: 77 (06/24 0507)  Labs:  Recent Labs  08/18/12 0605 08/19/12 0535 08/20/12 0457  HGB 7.9* 8.1*  --   HCT 24.5* 24.8*  --   PLT 173 183  --   LABPROT  --  15.2 16.0*  INR  --  1.22 1.31  CREATININE 1.93* 1.85*  --     Estimated Creatinine Clearance: 63.3 ml/min (by C-G formula based on Cr of 1.85).   Medications:  Scheduled:  . atorvastatin  40 mg Oral q1800  . ferrous sulfate  325 mg Oral BID WC  . fluconazole  100 mg Oral Daily  . insulin aspart  0-15 Units Subcutaneous TID WC  . insulin aspart  0-5 Units Subcutaneous QHS  . insulin glargine  15 Units Subcutaneous BID  . levothyroxine  200 mcg Oral QAC breakfast  . mirabegron ER  25 mg Oral Daily  . pantoprazole  40 mg Oral Q1200  . tamsulosin  0.4 mg Oral QPC supper  . vitamin C  250 mg Oral Daily  . 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  His Coumadin was held on admission 6/17 and reversed 6/18 due to supratherapeutic INR and hematuria.  Coumadin was restarted on 6/22 and his INR remains subtherapeutic as would be expected at this point.  Note he is also on fluconazole which can increase his sensitivity to Coumadin.  Goal of Therapy:  INR 2-3   Plan:  Coumadin 3mg  today Daily PT/INR  Legrand Como, Pharm.D., BCPS Clinical Pharmacist Phone: [phone] or [phone] Pager: [phone] 08/20/2012, 9:20 AM

## 2012-08-20 NOTE — Progress Notes (Signed)
RT Note: Pt has CPAP setup at bedside and states he will place himself on when he is ready. RT will continue to monitor

## 2012-08-21 DIAGNOSIS — F411 Generalized anxiety disorder: Secondary | ICD-10-CM | POA: Diagnosis not present

## 2012-08-21 DIAGNOSIS — N319 Neuromuscular dysfunction of bladder, unspecified: Secondary | ICD-10-CM | POA: Diagnosis not present

## 2012-08-21 DIAGNOSIS — Z872 Personal history of diseases of the skin and subcutaneous tissue: Secondary | ICD-10-CM | POA: Diagnosis not present

## 2012-08-21 DIAGNOSIS — L738 Other specified follicular disorders: Secondary | ICD-10-CM | POA: Diagnosis not present

## 2012-08-21 DIAGNOSIS — Z7982 Long term (current) use of aspirin: Secondary | ICD-10-CM | POA: Diagnosis not present

## 2012-08-21 DIAGNOSIS — Z8709 Personal history of other diseases of the respiratory system: Secondary | ICD-10-CM | POA: Diagnosis not present

## 2012-08-21 DIAGNOSIS — N329 Bladder disorder, unspecified: Secondary | ICD-10-CM | POA: Diagnosis not present

## 2012-08-21 DIAGNOSIS — N200 Calculus of kidney: Secondary | ICD-10-CM | POA: Diagnosis not present

## 2012-08-21 DIAGNOSIS — R109 Unspecified abdominal pain: Secondary | ICD-10-CM | POA: Diagnosis not present

## 2012-08-21 DIAGNOSIS — L97809 Non-pressure chronic ulcer of other part of unspecified lower leg with unspecified severity: Secondary | ICD-10-CM | POA: Diagnosis not present

## 2012-08-21 DIAGNOSIS — S37009A Unspecified injury of unspecified kidney, initial encounter: Secondary | ICD-10-CM | POA: Diagnosis not present

## 2012-08-21 DIAGNOSIS — Z9889 Other specified postprocedural states: Secondary | ICD-10-CM | POA: Diagnosis not present

## 2012-08-21 DIAGNOSIS — D649 Anemia, unspecified: Secondary | ICD-10-CM | POA: Diagnosis not present

## 2012-08-21 DIAGNOSIS — G4730 Sleep apnea, unspecified: Secondary | ICD-10-CM | POA: Diagnosis not present

## 2012-08-21 DIAGNOSIS — Z794 Long term (current) use of insulin: Secondary | ICD-10-CM | POA: Diagnosis not present

## 2012-08-21 DIAGNOSIS — I10 Essential (primary) hypertension: Secondary | ICD-10-CM | POA: Diagnosis not present

## 2012-08-21 DIAGNOSIS — S88119A Complete traumatic amputation at level between knee and ankle, unspecified lower leg, initial encounter: Secondary | ICD-10-CM | POA: Diagnosis not present

## 2012-08-21 DIAGNOSIS — R300 Dysuria: Secondary | ICD-10-CM | POA: Diagnosis not present

## 2012-08-21 DIAGNOSIS — Z9981 Dependence on supplemental oxygen: Secondary | ICD-10-CM | POA: Diagnosis not present

## 2012-08-21 DIAGNOSIS — N189 Chronic kidney disease, unspecified: Secondary | ICD-10-CM | POA: Diagnosis not present

## 2012-08-21 DIAGNOSIS — R310 Gross hematuria: Secondary | ICD-10-CM | POA: Diagnosis not present

## 2012-08-21 DIAGNOSIS — Z79899 Other long term (current) drug therapy: Secondary | ICD-10-CM | POA: Diagnosis not present

## 2012-08-21 DIAGNOSIS — I739 Peripheral vascular disease, unspecified: Secondary | ICD-10-CM | POA: Diagnosis not present

## 2012-08-21 DIAGNOSIS — N289 Disorder of kidney and ureter, unspecified: Secondary | ICD-10-CM | POA: Diagnosis not present

## 2012-08-21 DIAGNOSIS — Z01818 Encounter for other preprocedural examination: Secondary | ICD-10-CM | POA: Diagnosis not present

## 2012-08-21 DIAGNOSIS — E119 Type 2 diabetes mellitus without complications: Secondary | ICD-10-CM | POA: Diagnosis not present

## 2012-08-21 DIAGNOSIS — R10819 Abdominal tenderness, unspecified site: Secondary | ICD-10-CM | POA: Diagnosis not present

## 2012-08-21 DIAGNOSIS — N179 Acute kidney failure, unspecified: Secondary | ICD-10-CM | POA: Diagnosis not present

## 2012-08-21 DIAGNOSIS — I82409 Acute embolism and thrombosis of unspecified deep veins of unspecified lower extremity: Secondary | ICD-10-CM | POA: Diagnosis not present

## 2012-08-21 DIAGNOSIS — Z8739 Personal history of other diseases of the musculoskeletal system and connective tissue: Secondary | ICD-10-CM | POA: Diagnosis not present

## 2012-08-21 DIAGNOSIS — B3749 Other urogenital candidiasis: Secondary | ICD-10-CM | POA: Diagnosis not present

## 2012-08-21 DIAGNOSIS — Z86718 Personal history of other venous thrombosis and embolism: Secondary | ICD-10-CM | POA: Diagnosis not present

## 2012-08-21 DIAGNOSIS — R52 Pain, unspecified: Secondary | ICD-10-CM | POA: Diagnosis not present

## 2012-08-21 DIAGNOSIS — N400 Enlarged prostate without lower urinary tract symptoms: Secondary | ICD-10-CM | POA: Diagnosis not present

## 2012-08-21 DIAGNOSIS — R1084 Generalized abdominal pain: Secondary | ICD-10-CM | POA: Diagnosis not present

## 2012-08-21 DIAGNOSIS — I119 Hypertensive heart disease without heart failure: Secondary | ICD-10-CM | POA: Diagnosis not present

## 2012-08-21 DIAGNOSIS — R319 Hematuria, unspecified: Secondary | ICD-10-CM | POA: Diagnosis not present

## 2012-08-21 DIAGNOSIS — Z8701 Personal history of pneumonia (recurrent): Secondary | ICD-10-CM | POA: Diagnosis not present

## 2012-08-21 DIAGNOSIS — F3289 Other specified depressive episodes: Secondary | ICD-10-CM | POA: Diagnosis not present

## 2012-08-21 DIAGNOSIS — R3989 Other symptoms and signs involving the genitourinary system: Secondary | ICD-10-CM | POA: Diagnosis not present

## 2012-08-21 DIAGNOSIS — K2970 Gastritis, unspecified, without bleeding: Secondary | ICD-10-CM | POA: Diagnosis not present

## 2012-08-21 DIAGNOSIS — I890 Lymphedema, not elsewhere classified: Secondary | ICD-10-CM | POA: Diagnosis not present

## 2012-08-21 DIAGNOSIS — E785 Hyperlipidemia, unspecified: Secondary | ICD-10-CM | POA: Diagnosis not present

## 2012-08-21 DIAGNOSIS — N1330 Unspecified hydronephrosis: Secondary | ICD-10-CM | POA: Diagnosis not present

## 2012-08-21 DIAGNOSIS — E039 Hypothyroidism, unspecified: Secondary | ICD-10-CM

## 2012-08-21 DIAGNOSIS — G8929 Other chronic pain: Secondary | ICD-10-CM | POA: Diagnosis not present

## 2012-08-21 DIAGNOSIS — Z87442 Personal history of urinary calculi: Secondary | ICD-10-CM | POA: Diagnosis not present

## 2012-08-21 DIAGNOSIS — N3000 Acute cystitis without hematuria: Secondary | ICD-10-CM | POA: Diagnosis not present

## 2012-08-21 DIAGNOSIS — Z792 Long term (current) use of antibiotics: Secondary | ICD-10-CM | POA: Diagnosis not present

## 2012-08-21 DIAGNOSIS — Z8679 Personal history of other diseases of the circulatory system: Secondary | ICD-10-CM | POA: Diagnosis not present

## 2012-08-21 DIAGNOSIS — J9600 Acute respiratory failure, unspecified whether with hypoxia or hypercapnia: Secondary | ICD-10-CM | POA: Diagnosis not present

## 2012-08-21 DIAGNOSIS — N390 Urinary tract infection, site not specified: Secondary | ICD-10-CM | POA: Diagnosis not present

## 2012-08-21 DIAGNOSIS — Z5189 Encounter for other specified aftercare: Secondary | ICD-10-CM | POA: Diagnosis not present

## 2012-08-21 DIAGNOSIS — R350 Frequency of micturition: Secondary | ICD-10-CM | POA: Diagnosis not present

## 2012-08-21 DIAGNOSIS — I129 Hypertensive chronic kidney disease with stage 1 through stage 4 chronic kidney disease, or unspecified chronic kidney disease: Secondary | ICD-10-CM | POA: Diagnosis not present

## 2012-08-21 DIAGNOSIS — Z7901 Long term (current) use of anticoagulants: Secondary | ICD-10-CM | POA: Diagnosis not present

## 2012-08-21 DIAGNOSIS — Z4789 Encounter for other orthopedic aftercare: Secondary | ICD-10-CM | POA: Diagnosis not present

## 2012-08-21 DIAGNOSIS — I82291 Chronic embolism and thrombosis of other thoracic veins: Secondary | ICD-10-CM | POA: Diagnosis not present

## 2012-08-21 DIAGNOSIS — G4733 Obstructive sleep apnea (adult) (pediatric): Secondary | ICD-10-CM | POA: Diagnosis not present

## 2012-08-21 DIAGNOSIS — N183 Chronic kidney disease, stage 3 unspecified: Secondary | ICD-10-CM | POA: Diagnosis not present

## 2012-08-21 LAB — PROTIME-INR
INR: 1.37 (ref 0.00–1.49)
Prothrombin Time: 16.5 seconds — ABNORMAL HIGH (ref 11.6–15.2)

## 2012-08-21 LAB — GLUCOSE, CAPILLARY
Glucose-Capillary: 239 mg/dL — ABNORMAL HIGH (ref 70–99)
Glucose-Capillary: 174 mg/dL — ABNORMAL HIGH (ref 70–99)

## 2012-08-21 LAB — GC/CHLAMYDIA PROBE AMP: GC Probe RNA: NEGATIVE

## 2012-08-21 MED ORDER — LIDOCAINE HCL 2 % EX GEL: Freq: Three times a day (TID) | CUTANEOUS | Status: DC | PRN

## 2012-08-21 MED ORDER — INSULIN ASPART 100 UNIT/ML ~~LOC~~ SOLN: 0.0000 [IU] | Freq: Every day | SUBCUTANEOUS | Status: DC

## 2012-08-21 MED ORDER — PANTOPRAZOLE SODIUM 40 MG PO TBEC: 40.0000 mg | DELAYED_RELEASE_TABLET | Freq: Every day | ORAL | Status: DC

## 2012-08-21 MED ORDER — OXYCODONE HCL 15 MG PO TABS: 15.0000 mg | ORAL_TABLET | ORAL | Status: DC | PRN

## 2012-08-21 MED ORDER — MIRABEGRON ER 25 MG PO TB24: 25.0000 mg | ORAL_TABLET | Freq: Every day | ORAL | Status: DC

## 2012-08-21 MED ORDER — FLUCONAZOLE 100 MG PO TABS: 100.0000 mg | ORAL_TABLET | Freq: Every day | ORAL | Status: DC

## 2012-08-21 MED ORDER — TAMSULOSIN HCL 0.4 MG PO CAPS: 0.4000 mg | ORAL_CAPSULE | Freq: Every day | ORAL | Status: DC

## 2012-08-21 MED ORDER — INSULIN ASPART 100 UNIT/ML ~~LOC~~ SOLN: 0.0000 [IU] | Freq: Three times a day (TID) | SUBCUTANEOUS | Status: DC

## 2012-08-21 MED ORDER — CYCLOBENZAPRINE HCL 5 MG PO TABS: 5.0000 mg | ORAL_TABLET | Freq: Every day | ORAL | Status: DC

## 2012-08-21 MED ORDER — FENTANYL CITRATE 0.05 MG/ML IJ SOLN: 25.0000 ug | INTRAMUSCULAR | Status: DC | PRN

## 2012-08-21 MED ORDER — WARFARIN 0.5 MG HALF TABLET
4.5000 mg | ORAL_TABLET | Freq: Once | ORAL | Status: AC
  Administered 2012-08-21: 4.5 mg via ORAL
  Filled 2012-08-21: qty 1

## 2012-08-21 NOTE — Discharge Summary (Addendum)
Physician Discharge Summary  [name] [id number] DOB: 1955/11/25 DOA: 08/13/2012  PCP: [name], MD  Admit date: 08/13/2012 Discharge date: 08/21/2012  Time spent: 35 minutes  Recommendations for Outpatient Follow-up:  1. CPAP at night- EPAP: 10; flow 2; RR 18 2.  silicone foam for absorbency, change every other day- for tibial wound 3. Check BS 4.  Follow up with urology for catheter removal 5.  INR Friday- 4.5 mg tonight and tommorrow CBC, BMP 1 week   Discharge Diagnoses:  Principal Problem:   AKI (acute kidney injury) Active Problems:   DIABETES MELLITUS-TYPE II   Hyperkalemia   Metabolic acidosis   Hypothyroidism   Cellulitis   Nephrolithiasis   Acute respiratory failure with hypoxia   Candidal urethritis in male   CKD (chronic kidney disease) stage 3, GFR 30-59 ml/min   Discharge Condition: improved  Diet recommendation: diabetic  Filed Weights   08/18/12 1924 08/19/12 2149 08/20/12 2130  Weight: 135.5 kg (298 lb 11.6 oz) 132.087 kg (291 lb 3.2 oz) 136.487 kg (300 lb 14.4 oz)    History of present illness:  57 y/o male with multiple medical problems including nephrolithiasis was admitted on 6/18 from [place] [facility] due to flank pain and dysuria. He was found to have AKI, hyperkalemia and a metabolic acidosis.  He is unable to provide history because of intubation. By chart review: he presented to the [place] [facility] ED on 6/17 with complaints of flank pain and dysuria. He had been recently discharged after admission for flank pain and renal failure. During that admission he was treated conservatively with fluids, and antibiotics for a pan sensitive UTI. He was noted to have stones with no obstruction and urology followed him as an inpatient.  After discharge home on 6/11, his wife stated that he had persistent flank pain and hematuria. He did not eat much and he took multiple pain meds. She is not certain, but she believes that he may have  taken Aleve in addition to his narcotics.   Hospital Course:  Acute respiratory failure due to renal failure and profound metabolic acidosis, resolved  - Refractory shock from urosepsis, resolved   Severe dysuria, possibly due to candidal UTI or candidal urethritis  - Spoke with [name] regarding clinical course and culture findings. Recommends treating for candidal infection as we cannot currently remove his Foley catheter - 14 more doses stop date: 7/9 - Remove Foley catheter when patient is able to get up and ambulate per urology  - Encourage ambulation out of bed  - Continue physical therapy  - GC/Ch PCR  - Continue lidocaine and myrbetriq for bladder spasms  - Patient encouraged to decrease his dependence on IV narcotics for his pain  - Increase oxycodone  - Avoid long-acting narcotics in this patient who has frequent intermittent acute kidney injury.     Acute on chronic renal failure due to nephrolithiasis and hydronephrosis, ATN and NSAID use, back to baseline kidney function, CKD stage 3.  - Minimize nephrotoxins and renally dosed medications  - Appreciate nephrology assistance  - HD catheter has been removed    Nephrolithiasis/urolithiasis due to uric acid stones, no current obstruction  - Appreciate nephrology recommendations  -24 hour urine collection- send to urology  Gross hematuria, resolving. Followup per his primary urologist 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 Anticoagulation was reversed with FFP on June 18.  - Continue Coumadin, INR subtherapeutic  - No need to bridge   Normocytic anemia, likely due to marrow suppression from acute illness, iron deficiency and renal parenchymal disease.  - Hemoglobin stable  - Continue iron   Right leg cellulitis, appears to be resolved and now has chronic changes.    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 Again discussed depression.  - Offered anxiolytic or depression medication but patient continues to decline.   Chronic venous stasis with ulceration and chronic stasis dermatitis  - Wound care per above   SIGNIFICANT EVENTS:  6/17 Admit with VDRF, ARF, shock, hyperkalemia, metabolic acidosis  [id number] Renal, urology consult, CRRT started  6/19 Off CRRT  6/20 Transfuse PRBC  6/21 Transfer to telemetry  STUDIES:  6/17 CT abdomen> 59mm stone in left renal pelvis with mild hydronephrosis, retroperitoneal adenopathy  6/20 CT abdomen> appears to have passed the small stone that was in the left renal pelvis  LINES / TUBES:  6/17 ETT >> 6/19  6/18 Rt IJ HD cath >>  6/18 Rt Radial Aline >> 6/20   Consultations:  Urology  renal  Discharge Exam: Filed Vitals:   08/20/12 2025 08/20/12 2130 08/21/12 0500 08/21/12 0947  BP: 113/68  125/84 101/71  Pulse: 82  83 85  Temp: 98.2 F (36.8 C)  98.2 F (36.8 C) 98.2 F (36.8 C)  TempSrc: Oral  Oral Oral  Resp: 18  18 18   Height:      Weight:  136.487 kg (300 lb 14.4 oz)    SpO2: 93%  95% 92%    General: A+Ox3, NAD Cardiovascular: rrr Respiratory: clear anterior  Discharge Instructions  Discharge Orders   Future Orders Complete By Expires     Diet Carb Modified  As directed     Discharge instructions  As directed     Comments:      Needs 24 hour urine collection- send to urology ([name]) INR on Friday AM ( on abx) Continue with foley until seen by urology Blood sugars TID    Increase activity slowly  As directed         Medication List     STOP taking these medications       ALPHA LIPOIC ACID PO     cefdinir 300 MG capsule  Commonly known as:  OMNICEF     GARLIC PO     HYDROcodone-acetaminophen 5-325 MG per tablet  Commonly known as:  NORCO/VICODIN     mupirocin ointment 2 %  Commonly known as:  BACTROBAN     naproxen sodium 220 MG tablet  Commonly known as:  ANAPROX     VITAMIN C PO     VITAMIN E PO      TAKE these medications       atorvastatin 40 MG tablet  Commonly known as:  LIPITOR  Take 40 mg by mouth daily.     cyclobenzaprine 5 MG tablet  Commonly known as:  FLEXERIL  Take 1 tablet (5 mg total) by mouth at bedtime.     ferrous sulfate 325 (  65 FE) MG tablet  Take 325 mg by mouth 2 (two) times daily.     FISH OIL PO  Take 3 tablets by mouth 2 (two) times daily.     fluconazole 100 MG tablet  Commonly known as:  DIFLUCAN  Take 1 tablet (100 mg total) by mouth daily. For 14 more doses     FOLIC ACID PO  Take 1 tablet by mouth daily.     gemfibrozil 600 MG tablet  Commonly known as:  LOPID  Take 600 mg by mouth 2 (two) times daily before a meal.     insulin aspart 100 UNIT/ML injection  Commonly known as:  novoLOG  Inject 0-15 Units into the skin 3 (three) times daily with meals.     insulin aspart 100 UNIT/ML injection  Commonly known as:  novoLOG  Inject 0-5 Units into the skin at bedtime.     insulin glargine 100 UNIT/ML injection  Commonly known as:  LANTUS  Inject 0.2 mLs (20 Units total) into the skin 2 (two) times daily.     levothyroxine 200 MCG tablet  Commonly known as:  SYNTHROID, LEVOTHROID  Take 200 mcg by mouth daily.     lidocaine 2 % jelly  Commonly known as:  XYLOCAINE  Apply topically every 8 (eight) hours as needed (urethral pain).     mirabegron ER 25 MG Tb24  Commonly known as:  MYRBETRIQ  Take 1 tablet (25 mg total) by mouth daily.     multivitamins ther. w/minerals Tabs  Take 1 tablet by mouth daily.     oxyCODONE 15 MG immediate release tablet   Commonly known as:  ROXICODONE  Take 1 tablet (15 mg total) by mouth every 4 (four) hours as needed.     pantoprazole 40 MG tablet  Commonly known as:  PROTONIX  Take 1 tablet (40 mg total) by mouth daily at 12 noon.     tamsulosin 0.4 MG Caps  Commonly known as:  FLOMAX  Take 1 capsule (0.4 mg total) by mouth daily after supper.     warfarin 3 MG tablet  Commonly known as:  COUMADIN  Take 3-4.5 mg by mouth daily. 3mg , 3mg , 4.5mg  then repeat       No Known Allergies    The results of significant diagnostics from this hospitalization (including imaging, microbiology, ancillary and laboratory) are listed below for reference.    Significant Diagnostic Studies: Ct Abdomen Pelvis Wo Contrast  08/16/2012   *RADIOLOGY REPORT*  Clinical Data: Mild left hydronephrosis.  Stone in the left renal pelvis.  CT ABDOMEN AND PELVIS WITHOUT CONTRAST  Technique:  Multidetector CT imaging of the abdomen and pelvis was performed following the standard protocol without intravenous contrast.  Comparison: CT scan dated 08/13/2012  Findings: There is new slight bibasilar atelectasis.  The stone seen previously in the left renal pelvis is no longer visible.  Stones in the left upper and lower poles are unchanged. The left renal pelvis is no longer prominent.  The both ureters are slightly more prominent throughout their length as compared to the prior study.  No visible distal ureteral calculi or bladder calculi.  Detail of the bladder base is somewhat compromised by the metallic artifact from the hip prostheses.  There is a small catheter in the bladder.  Right renal stones are unchanged.  The bilateral perinephric soft tissue stranding is slightly more prominent than on the prior study.  Note is made of a solitary gallstone which has now moved  into the neck of the gallbladder.  Gallbladder is not distended.  Liver, spleen, pancreas, and adrenal glands are normal.  No dilated bile ducts.  Multiple diverticula  scattered throughout the nondistended colon. No acute osseous abnormalities.  IMPRESSION:  1.  The patient appears to have passed the small stone that was in the left renal pelvis on the prior exam.  The left renal pelvis is no longer slightly distended. 2.  Both ureters appear slightly more prominent throughout their length but there is no evidence of stones obstructing the ureters.   Original Report Authenticated By: Lorriane Shire, M.D.   Ct Abdomen Pelvis Wo Contrast  08/13/2012   *RADIOLOGY REPORT*  Clinical Data: Abdominal pain and unable to urinate.  CT ABDOMEN AND PELVIS WITHOUT CONTRAST  Technique:  Multidetector CT imaging of the abdomen and pelvis was performed following the standard protocol without intravenous contrast.  Comparison: 08/04/2012  Findings: Lung bases are clear.  No evidence for pneumoperitoneum.  High density structure in the gallbladder is consistent with a gallstone.  No gross abnormality to the liver, spleen or pancreas. Normal appearance of the adrenal tissue.  Again noted is bilateral perinephric edema or stranding.  There is increased dilatation and stranding around the proximal right ureter.  Again seen are stones within the right kidney.  The largest right kidney stone measures 8 mm.  No clear evidence for a right ureter stone but the distal right ureter and the ureterovesical junction are obscured by the streak artifact from the bilateral hip replacements. However, compared to the previous examination, there appears to be one less stone within the right kidney and it is possible that a small stone has migrated into the distal right ureter.  There is now a punctate stone in the left renal pelvis which is was not present on the prior examination. There is at least mild left hydronephrosis.  There is increased edema in the retroperitoneum since the prior examination.  Again noted are small lymph nodes in the periaortic region.  The urinary bladder is decompressed.  No acute bony  abnormality.  IMPRESSION: Bilateral renal calculi.   A 3 mm stone has migrated to the left renal pelvis.  There is mild left hydronephrosis.  In general, there appears to be increased edema and stranding around the ureters bilaterally.  Mild dilatation of both ureters compared with the previous examination.  Difficult to exclude distal ureter or ureterovesical stones because of artifact in the pelvis.  It is possible that a small stone has migrated from the right kidney because there appears to be one less small stone in the right kidney compared to the prior examination.  Gallstone.   Original Report Authenticated By: Markus Daft, M.D.   Ct Abdomen Pelvis Wo Contrast  08/04/2012   *RADIOLOGY REPORT*  Clinical Data: Kidney stones.  Abdominal pain.  Dysuria.  Frequent urination.  CT ABDOMEN AND PELVIS WITHOUT CONTRAST  Technique:  Multidetector CT imaging of the abdomen and pelvis was performed following the standard protocol without intravenous contrast.  Comparison: 07/01/2012.  Findings: Lung Bases: Dependent atelectasis. Coronary artery atherosclerosis is present. If office based assessment of coronary risk factors has not been performed, it is now recommended.  Liver:  Unenhanced CT was performed per clinician order.  Lack of IV contrast limits sensitivity and specificity, especially for evaluation of abdominal/pelvic solid viscera.  Riedel's lobe of the liver, normal variant.  No gross mass lesion.  Spleen:  Normal.  Gallbladder:  Cholelithiasis.  No CT evidence of  acute cholecystitis.  Common bile duct:  Normal.  Pancreas:  Atrophic.  No acute abnormality.  Adrenal glands:  Normal.  Kidneys:  Unchanged bilateral nonobstructing renal collecting system calculi.  The ureters appear within normal limits.  The distal ureters and UVJ are obscured by artifact from total hip arthroplasties.  Stomach:  Grossly normal.  Small bowel:  Grossly normal.  Colon:   Normal appendix.  Colonic diverticulosis.  No inflammatory  changes are visualized.  Redundant sigmoid.  Pelvic Genitourinary:  No free fluid.  Urinary bladder decompressed.  Mural thickening of the bladder is likely secondary to under distention.  Bones:  Bilateral total hip arthroplasties.  The thoracolumbar spondylosis.  Chronic lower thoracic compression fractures with unchanged alignment compared to prior.  Exaggerated thoracolumbar kyphosis. Heterotopic bone around the hip arthroplasties. Chronic calcification in the left iliopsoas muscle with psoas muscular atrophy.  Vasculature: Atherosclerosis without acute abnormality.  Body Wall: The patient's abdomen is protuberant and portions extend off the field of scan.  Fat containing bilateral inguinal hernias.  IMPRESSION:  1.  Bilateral nonobstructing renal calculi.  No hydronephrosis or ureteral calculi identified. 2. Cholelithiasis. 3.  No acute abnormality.   Original Report Authenticated By: Dereck Ligas, M.D.   Dg Chest Port 1 View  08/15/2012   *RADIOLOGY REPORT*  Clinical Data: Acute respiratory failure.  Intubation.  PORTABLE CHEST - 1 VIEW  Comparison: 08/14/2012  Findings: NG tube tip is in the distal esophagus and needs be advanced.  Endotracheal tube is in good position.  Central venous catheter in good position, unchanged.  The pulmonary vascularity is normal.  Minimal atelectasis and peribronchial thickening at the right base.  IMPRESSION: Slight right base atelectasis.  NG tube needs to be advanced.   Original Report Authenticated By: Lorriane Shire, M.D.   Dg Chest Port 1 View  08/14/2012   *RADIOLOGY REPORT*  Clinical Data: Hemodialysis catheter placement.  PORTABLE CHEST - 1 VIEW  Comparison: 08/13/2012 and 07/04/2010.  Findings: 0703 hours.  Interval right IJ dialysis catheter placement with tip at the level of the SVC right atrial junction. A nasogastric tube has been placed, projecting below the diaphragm. Endotracheal tube is unchanged within the mid trachea.  The heart size and mediastinal  contours are stable.  There is stable mild atelectasis at both lung bases.  No pneumothorax or significant pleural effusion is identified.  IMPRESSION: Interval hemodialysis catheter and nasogastric tube placement without demonstrated complication.   Original Report Authenticated By: Richardean Sale, M.D.   Dg Chest Port 1 View  08/13/2012   *RADIOLOGY REPORT*  Clinical Data: Chest pain.  PORTABLE CHEST - 1 VIEW  Comparison: Chest x-ray 07/04/2010.  Findings: An endotracheal tube is in place with tip 3.2 cm above the carina. Lung volumes are low.  No definite acute consolidative airspace disease.  No definite pleural effusions.  Crowding of the pulmonary vasculature, accentuated by low lung volumes.  Mild cardiomegaly is unchanged. The patient is rotated to the right on today's exam, resulting in distortion of the mediastinal contours and reduced diagnostic sensitivity and specificity for mediastinal pathology.  IMPRESSION: 1.  Tip of endotracheal tube is 3.2 cm above the carina. 2.  Low lung volumes. 3.  Mild cardiomegaly.   Original Report Authenticated By: Vinnie Langton, M.D.    Microbiology: Recent Results (from the past 240 hour(s))  URINE CULTURE     Status: None   Collection Time    08/13/12  8:19 PM      Result Value Range Status  Specimen Description URINE, CATHETERIZED   Final   Special Requests NONE   Final   Culture  Setup Time 08/14/2012 00:59   Final   Colony Count NO GROWTH   Final   Culture NO GROWTH   Final   Report Status 08/14/2012 FINAL   Final  MRSA PCR SCREENING     Status: Abnormal   Collection Time    08/14/12  1:28 AM      Result Value Range Status   MRSA by PCR POSITIVE (*) NEGATIVE Final   Comment:            The GeneXpert MRSA Assay (FDA     approved for NASAL specimens     only), is one component of a     comprehensive MRSA colonization     surveillance program. It is not     intended to diagnose MRSA     infection nor to guide or     monitor treatment  for     MRSA infections.     RESULT CALLED TO, READ BACK BY AND VERIFIED WITH:     CALLED TO RN JAMES ARTIS [phone] @0420  THANEY  CULTURE, BLOOD (ROUTINE X 2)     Status: None   Collection Time    08/14/12  2:50 AM      Result Value Range Status   Specimen Description BLOOD RIGHT HAND   Final   Special Requests BOTTLES DRAWN AEROBIC ONLY [facility]   Final   Culture  Setup Time 08/14/2012 10:52   Final   Culture NO GROWTH 5 DAYS   Final   Report Status 08/20/2012 FINAL   Final  CULTURE, BLOOD (ROUTINE X 2)     Status: None   Collection Time    08/14/12  3:05 AM      Result Value Range Status   Specimen Description BLOOD RIGHT ARM   Final   Special Requests BOTTLES DRAWN AEROBIC ONLY [facility]   Final   Culture  Setup Time 08/14/2012 10:50   Final   Culture NO GROWTH 5 DAYS   Final   Report Status 08/20/2012 FINAL   Final  WOUND CULTURE     Status: None   Collection Time    08/16/12 10:08 AM      Result Value Range Status   Specimen Description WOUND PENIS   Final   Special Requests NONE   Final   Gram Stain     Final   Value: NO WBC SEEN     MODERATE SQUAMOUS EPITHELIAL CELLS PRESENT     MODERATE YEAST   Culture MODERATE CANDIDA ALBICANS   Final   Report Status 08/18/2012 FINAL   Final  URINE CULTURE     Status: None   Collection Time    08/16/12 12:11 PM      Result Value Range Status   Specimen Description URINE, CATHETERIZED   Final   Special Requests Normal   Final   Culture  Setup Time 08/16/2012 12:50   Final   Colony Count NO GROWTH   Final   Culture NO GROWTH   Final   Report Status 08/17/2012 FINAL   Final  URINE CULTURE     Status: None   Collection Time    08/18/12  6:16 PM      Result Value Range Status   Specimen Description URINE, RANDOM   Final   Special Requests NONE   Final   Culture  Setup Time 08/19/2012 00:17   Final  Colony Count NO GROWTH   Final   Culture NO GROWTH   Final   Report Status 08/20/2012 FINAL   Final  GC/CHLAMYDIA PROBE AMP     Status:  None   Collection Time    08/20/12  7:20 PM      Result Value Range Status   CT Probe RNA NEGATIVE  NEGATIVE Final   GC Probe RNA NEGATIVE  NEGATIVE Final   Comment: (NOTE)                                                                                              Normal Reference Range: Negative          Assay performed using the Gen-Probe APTIMA COMBO2 (R) Assay.     Acceptable specimen types for this assay include APTIMA Swabs (Unisex,     endocervical, urethral, or vaginal), first void urine, and ThinPrep     liquid based cytology samples.     Labs: Basic Metabolic Panel:  Recent Labs Lab 08/15/12 0350 08/16/12 0300 08/16/12 1100 08/17/12 0325 08/18/12 0605 08/19/12 0535  NA 143 138 138 138 134* 134*  K 3.0* 3.3* 3.2* 3.6 3.9 4.0  CL 104 104 103 104 102 100  CO2 27 26 27 27 25 26   GLUCOSE 167* 216* 216* 201* 235* 196*  BUN 45* 37* 34* 32* 30* 28*  CREATININE 1.78* 1.95* 1.94* 1.90* 1.93* 1.85*  CALCIUM 8.4 7.9* 7.7* 7.9* 7.8* 8.1*  MG 2.0  --   --   --   --   --   PHOS 1.4* 2.6 1.8* 2.0*  --  2.5   Liver Function Tests:  Recent Labs Lab 08/14/12 1500 08/15/12 0350 08/16/12 0300 08/16/12 1100 08/17/12 0325  ALBUMIN 2.4* 2.4* 2.0* 2.0* 2.0*   No results found for this basename: LIPASE, AMYLASE,  in the last 168 hours No results found for this basename: AMMONIA,  in the last 168 hours CBC:  Recent Labs Lab 08/15/12 0350 08/16/12 0300 08/16/12 0400 08/16/12 1530 08/17/12 0325 08/18/12 0605 08/19/12 0535  WBC 5.0 4.9  --   --  6.1 7.3 7.6  HGB 7.5* 6.6* 6.5* 7.9* 7.6* 7.9* 8.1*  HCT 22.0* 19.8* 19.0* 23.4* 23.1* 24.5* 24.8*  MCV 89.8 91.7  --   --  91.7 93.2 92.2  PLT 168 147*  --   --  152 173 183   Cardiac Enzymes: No results found for this basename: CKTOTAL, CKMB, CKMBINDEX, TROPONINI,  in the last 168 hours BNP: BNP (last 3 results) No results found for this basename: PROBNP,  in the last 8760 hours CBG:  Recent Labs Lab 08/20/12 0750  08/20/12 1143 08/20/12 1619 08/20/12 2023 08/21/12 0732  GLUCAP 197* 245* 188* 233* 188*       Signed:  Jalie Eiland  [facility] 08/21/2012, 10:33 AM

## 2012-08-21 NOTE — Clinical Social Work Psychosocial (Signed)
Clinical Social Work Department BRIEF PSYCHOSOCIAL ASSESSMENT 08/21/2012  Patient:  [name], [name]     Account Number:  [id number]     Admit date:  08/13/2012  Clinical Social Worker:  Frederico Hamman  Date/Time:  08/21/2012 12:00 M  Referred by:  Physician  Date Referred:  08/19/2012 Referred for  SNF Placement   Other Referral:   Interview type:  Family Other interview type:   CSW talked with wife [name]    PSYCHOSOCIAL DATA Living Status:  WIFE Admitted from facility:   Level of care:   Primary support name:  [name] Primary support relationship to patient:  SPOUSE Degree of support available:   Wife appears caring of patient's wellbeing.    CURRENT CONCERNS Current Concerns  Post-Acute Placement   Other Concerns:   CSW had been advised that patient wanted CSW to talk with wife about d/c plans and not him.    SOCIAL WORK ASSESSMENT / PLAN On 08/20/12 CSW talked with [name] [phone]) regarding discharge plans and recommendation of ST rehab. [name] reported that husband had been to [facility] before for rehab, but he would not want to return there. She also said no to [facility] and [facility].  Wife agreeable to [facility] rehab and was advised of SNF search process. CSW advised [name] that she will be contacted on 6/25 with facility responses.   Assessment/plan status:  Psychosocial Support/Ongoing Assessment of Needs Other assessment/ plan:   Information/referral to community resources:    PATIENT'S/FAMILY'S RESPONSE TO PLAN OF CARE: Wife pleasant and agreeable to ST rehab for patient

## 2012-08-21 NOTE — Clinical Social Work Placement (Addendum)
Clinical Social Work Department CLINICAL SOCIAL WORK PLACEMENT NOTE 08/21/2012  Patient:  VIVIEN, STEPHAN  Account Number:  [id number] Admit date:  08/13/2012  Clinical Social Worker:  Terria Deschepper Givens, LCSW  Date/time:  08/21/2012 02:55 AM  Clinical Social Work is seeking post-discharge placement for this patient at the following level of care:   SKILLED NURSING   (*CSW will update this form in Epic as items are completed)     Patient/family provided with [facility] Department of Clinical Social Work's list of facilities offering this level of care within the geographic area requested by the patient (or if unable, by the patient's family).  08/20/2012  Patient/family informed of their freedom to choose among providers that offer the needed level of care, that participate in Medicare, Medicaid or managed care program needed by the patient, have an available bed and are willing to accept the patient.    Patient/family informed of [facility]' ownership interest in [facility], as well as of the fact that they are under no obligation to receive care at this facility.  PASARR submitted to EDS on 01/17/08 PASARR number received from EDS on 01/17/08  FL2 transmitted to all facilities in geographic area requested by pt/family on  08/20/2012 FL2 transmitted to all facilities within larger geographic area on   Patient informed that his/her managed care company has contracts with or will negotiate with  certain facilities, including the following:     Patient/family informed of bed offers received:  08/21/2012 Patient chooses bed at [facility], [place] Physician recommends and patient chooses bed at    Patient to be transferred to [facility], [place] on  08/21/2012 Patient to be transferred to facility by ambulance  The following physician request were entered in Epic:   Additional Comments:

## 2012-08-21 NOTE — Progress Notes (Signed)
Physical Therapy Treatment Patient Details Name: [name] MRN: [id number] DOB: 1955/10/10 Today's Date: 08/21/2012 Time: [id number] PT Time Calculation (min): 24 min  PT Assessment / Plan / Recommendation  PT Comments   Pt improved in transfers with sliding board today.  Maximove pad placed in chair for nursing to use for return to bed  Follow Up Recommendations  SNF     Does the patient have the potential to tolerate intense rehabilitation     Barriers to Discharge        Equipment Recommendations  None recommended by PT    Recommendations for Other Services OT consult  Frequency Min 2X/week   Progress towards PT Goals Progress towards PT goals: Progressing toward goals  Plan Current plan remains appropriate    Precautions / Restrictions Restrictions Weight Bearing Restrictions: No   Pertinent Vitals/Pain Pt c/o pain, but less than prior treatment day    Mobility  Bed Mobility Bed Mobility: Sitting - Scoot to Edge of Bed;Rolling Left;Left Sidelying to Sit Rolling Right: 4: Min guard Rolling Left: 3: Mod assist;With rail Right Sidelying to Sit: 4: Min assist Left Sidelying to Sit: 1: +2 Total assist;4: Min assist Sitting - Scoot to Edge of Bed: 4: Min assist Sit to Supine: 4: Min assist Details for Bed Mobility Assistance: pt able to get from supine to sit and return to supine x2 with less c/o pain today Transfers Transfers: Lateral/Scoot Transfers Lateral/Scoot Transfers: 1: +2 Total assist;With armrests removed;With slide board Lateral Transfers: Patient Percentage: 60% Details for Transfer Assistance: pt movtivated to get to chair with sliding board today and was able to perform with less additional assist and less pain today Ambulation/Gait Ambulation/Gait Assistance: Not tested (comment) Stairs: No Wheelchair Mobility Wheelchair Mobility: No    Exercises General Exercises - Lower Extremity Quad Sets: AROM;Both;5 reps;Supine Gluteal Sets: AROM;Both;5  reps;Supine Hip ABduction/ADduction: AAROM;Both;10 reps;Supine Hip Flexion/Marching: AROM;Both;10 reps;Supine   PT Diagnosis:    PT Problem List:   PT Treatment Interventions:     PT Goals Acute Rehab PT Goals PT Goal Formulation: With patient Time For Goal Achievement: 08/31/12 Potential to Achieve Goals: Fair  Visit Information  Last PT Received On: 08/21/12 Assistance Needed: +2    Subjective Data      Cognition  Cognition Arousal/Alertness: Awake/alert Behavior During Therapy: WFL for tasks assessed/performed Overall Cognitive Status: No family/caregiver present to determine baseline cognitive functioning (pt needed frequent redirection to stay on task)    Balance  Static Sitting Balance Static Sitting - Balance Support: No upper extremity supported Static Sitting - Level of Assistance: 5: Stand by assistance  End of Session PT - End of Session Activity Tolerance: Other (comment);Patient tolerated treatment well (Exteme anxiety) Patient left: with call bell/phone within reach;in chair (maximove pad placed in chair for nursing to return to bed) Nurse Communication: Mobility status   GP     Norwood Levo 08/21/2012, 12:47 PM

## 2012-08-21 NOTE — Progress Notes (Signed)
ANTICOAGULATION CONSULT NOTE - Follow Up Consult  Pharmacy Consult for coumadin Indication: history of VTE  No Known Allergies  Patient Measurements: Height: 5\' 11"  (180.3 cm) Weight: 300 lb 14.4 oz (136.487 kg) IBW/kg (Calculated) : 75.3  Vital Signs: Temp: 98.2 F (36.8 C) (06/25 0500) Temp src: Oral (06/25 0500) BP: 125/84 mmHg (06/25 0500) Pulse Rate: 83 (06/25 0500)  Labs:  Recent Labs  08/19/12 0535 08/20/12 0457 08/21/12 0555  HGB 8.1*  --   --   HCT 24.8*  --   --   PLT 183  --   --   LABPROT 15.2 16.0* 16.5*  INR 1.22 1.31 1.37  CREATININE 1.85*  --   --     Estimated Creatinine Clearance: 64.4 ml/min (by C-G formula based on Cr of 1.85).  Assessment: Patient is a 57 y.o M on coumadin for hx DVT.  INR is subtherapeutic but is trending up.  With patient being on fluconazole, will watch INR closely.  Goal of Therapy:  INR 2-3   Plan:  1) coumadin 4.5mg  PO x1  [name] P 08/21/2012,7:42 AM

## 2012-08-22 ENCOUNTER — Other Ambulatory Visit: Payer: Self-pay | Admitting: *Deleted

## 2012-08-22 MED ORDER — OXYCODONE HCL 15 MG PO TABS: 15.0000 mg | ORAL_TABLET | ORAL | Status: DC | PRN

## 2012-08-28 ENCOUNTER — Encounter: Payer: Self-pay | Admitting: Internal Medicine

## 2012-08-28 ENCOUNTER — Non-Acute Institutional Stay (SKILLED_NURSING_FACILITY): Payer: Medicare Other | Admitting: Internal Medicine

## 2012-08-28 DIAGNOSIS — N183 Chronic kidney disease, stage 3 unspecified: Secondary | ICD-10-CM

## 2012-08-28 DIAGNOSIS — N189 Chronic kidney disease, unspecified: Secondary | ICD-10-CM

## 2012-08-28 DIAGNOSIS — E119 Type 2 diabetes mellitus without complications: Secondary | ICD-10-CM

## 2012-08-28 DIAGNOSIS — N179 Acute kidney failure, unspecified: Secondary | ICD-10-CM

## 2012-08-28 DIAGNOSIS — N200 Calculus of kidney: Secondary | ICD-10-CM

## 2012-08-28 DIAGNOSIS — I82409 Acute embolism and thrombosis of unspecified deep veins of unspecified lower extremity: Secondary | ICD-10-CM

## 2012-08-28 DIAGNOSIS — B3749 Other urogenital candidiasis: Secondary | ICD-10-CM | POA: Diagnosis not present

## 2012-08-28 DIAGNOSIS — B3741 Candidal cystitis and urethritis: Secondary | ICD-10-CM

## 2012-08-28 DIAGNOSIS — E039 Hypothyroidism, unspecified: Secondary | ICD-10-CM

## 2012-08-28 NOTE — Progress Notes (Signed)
Patient ID: [name], male   DOB: 1955/12/23, 57 y.o.   MRN: [id number] Provider:  Rexene Edison. Mariea Clonts, D.O., C.M.D. Location:  Location:  [facility] PCP: [name], MD  Code Status: full code  No Known Allergies  Chief Complaint: new admission s/p hospitalization with acute renal failure  HPI: 57 y.o. male with h/o DMII, CKDIII, nephrolithiasis, hypothyroidism was [facility] s/p hospitalization 6/18-25 with acute renal failure.   He was transferred from the [facility] due to acute respiratory failure.  He was treated for sepsis secondary to UTI, acute kidney injury, hyperkalemia and metabolic acidosis with fluids and antibiotics.  He had a pansensitive UTI and did not have a urinary obstruction.  He is to f/u with urology as an outpatient after his foley is removed 7/9 ([name]) and d/c summary says a 24 hour urine needs to be obtained.  He previously was using aleve with his narcotics.  He also had candidal urethritis and completes abx for that 09/04/12.  He is also on lidocaine and myrbetriq for bladder spasms.  His gross hematuria was resolving at discharge--imaging reveals that he passed a stone.  He takes flomax for BPH.  Pt is on longstanding coumadin with for prior DVT--he was sent here on 3mg , 3mg , 4.5mg  and repeat daily, but staff at SNF cannot give it this way so he was started on 3mg  daily and had his INR the next day.  He has had normocytic anemia of mixed etiology.  He was transfused.  He had right LE cellulitis that resolved.  His gemfibrozil was stopped due to his AKI.  He refused treatment for depression.  He has chronic venous stasis dermatitis with ulceration.    Review of Systems:  Review of Systems  Constitutional: Positive for malaise/fatigue. Negative for fever.  HENT: Negative for congestion.   Eyes: Negative for blurred vision.  Respiratory: Negative for shortness of breath.   Cardiovascular: Positive  for leg swelling. Negative for chest pain.  Gastrointestinal: Positive for constipation. Negative for abdominal pain, blood in stool and melena.  Genitourinary: Positive for dysuria, hematuria and flank pain.       C/o penile pain  Musculoskeletal: Positive for back pain. Negative for falls.  Skin: Positive for rash. Negative for itching.  Neurological: Positive for weakness. Negative for dizziness, loss of consciousness and headaches.  Endo/Heme/Allergies: Bruises/bleeds easily.  Psychiatric/Behavioral: Positive for depression.    Past Medical History  Diagnosis Date  . Sleep apnea     uses cpap  . Hypothyroidism   . Anemia   . Blood transfusion   . Chronic kidney disease     hx of kidney stones  . Hypertension   . Arthritis     osteoarthritis  . Pneumonia   . Diabetes mellitus     insulin dependent  . DVT (deep vein thrombosis) in pregnancy    Past Surgical History  Procedure Laterality Date  . Bilteral hip      replacement & revison  's   . Knee surgery      left knee    . Tonsillectomy    . Amputation  02/07/2011    Procedure: AMPUTATION BELOW KNEE;  Surgeon: Newt Minion, MD;  Location: [facility];  Service: Orthopedics;  Laterality: Left;  Left Below Knee Amputation   Social History:   reports that he has never smoked. He has never used smokeless tobacco. He reports that he does not drink  alcohol or use illicit drugs.  Family History  Problem Relation Age of Onset  . Diabetes Mother   . Colon cancer Father    Medications: Patient's Medications  New Prescriptions   No medications on file  Previous Medications   ATORVASTATIN (LIPITOR) 40 MG TABLET    Take 40 mg by mouth daily.   CYCLOBENZAPRINE (FLEXERIL) 5 MG TABLET    Take 1 tablet (5 mg total) by mouth at bedtime.   FERROUS SULFATE 325 (65 FE) MG TABLET    Take 325 mg by mouth 2 (two) times daily.     FLUCONAZOLE (DIFLUCAN) 100 MG TABLET    Take 1 tablet (100 mg total) by mouth daily. For 14 more doses    FOLIC ACID PO    Take 1 tablet by mouth daily.     GEMFIBROZIL (LOPID) 600 MG TABLET    Take 600 mg by mouth 2 (two) times daily before a meal.   INSULIN ASPART (NOVOLOG) 100 UNIT/ML INJECTION    Inject 0-15 Units into the skin 3 (three) times daily with meals.   INSULIN ASPART (NOVOLOG) 100 UNIT/ML INJECTION    Inject 0-5 Units into the skin at bedtime.   INSULIN GLARGINE (LANTUS) 100 UNIT/ML INJECTION    Inject 0.2 mLs (20 Units total) into the skin 2 (two) times daily.   LEVOTHYROXINE (SYNTHROID, LEVOTHROID) 200 MCG TABLET    Take 200 mcg by mouth daily.     LIDOCAINE (XYLOCAINE) 2 % JELLY    Apply topically every 8 (eight) hours as needed (urethral pain).   MIRABEGRON ER (MYRBETRIQ) 25 MG TB24    Take 1 tablet (25 mg total) by mouth daily.   MULTIPLE VITAMINS-MINERALS (MULTIVITAMINS THER. W/MINERALS) TABS    Take 1 tablet by mouth daily.     OMEGA-3 FATTY ACIDS (FISH OIL PO)    Take 3 tablets by mouth 2 (two) times daily.     OXYCODONE (ROXICODONE) 15 MG IMMEDIATE RELEASE TABLET    Take 1 tablet (15 mg total) by mouth every 4 (four) hours as needed.   PANTOPRAZOLE (PROTONIX) 40 MG TABLET    Take 1 tablet (40 mg total) by mouth daily at 12 noon.   TAMSULOSIN (FLOMAX) 0.4 MG CAPS    Take 1 capsule (0.4 mg total) by mouth daily after supper.   WARFARIN (COUMADIN) 3 MG TABLET    Take 3-4.5 mg by mouth daily. 3mg , 3mg , 4.5mg  then repeat  Modified Medications   No medications on file  Discontinued Medications   No medications on file   Physical Exam:  Physical Exam  Constitutional: He is oriented to person, place, and time. No distress.  Obese white male  HENT:  Head: Normocephalic and atraumatic.  Right Ear: External ear normal.  Left Ear: External ear normal.  Nose: Nose normal.  Mouth/Throat: Oropharynx is clear and moist. No oropharyngeal exudate.  Eyes: EOM are normal. Pupils are equal, round, and reactive to light.  Wears glasses  Neck: Normal range of motion. Neck supple. No JVD  present. No tracheal deviation present. No thyromegaly present.  Cardiovascular: Normal rate, regular rhythm and normal heart sounds.   Pulses not palpable with severe edema and chronic venous stasis changes  Pulmonary/Chest: Effort normal and breath sounds normal. No respiratory distress. He has no wheezes. He has no rales.  Abdominal: Soft. Bowel sounds are normal. He exhibits no distension and no mass. There is no tenderness.  Genitourinary: Penile tenderness present.  Musculoskeletal: Normal range of motion. He  exhibits edema. He exhibits no tenderness.  Lymphadenopathy:    He has no cervical adenopathy.  Neurological: He is alert and oriented to person, place, and time.  Skin: Skin is warm and dry. Rash noted.  Yeast rash in groin    Labs reviewed: Basic Metabolic Panel:  Recent Labs  08/14/12 0750  08/15/12 0350  08/16/12 1100 08/17/12 0325 08/18/12 0605 08/19/12 0535  NA 139  < > 143  < > 138 138 134* 134*  K 4.7  < > 3.0*  < > 3.2* 3.6 3.9 4.0  CL 110  < > 104  < > 103 104 102 100  CO2 13*  < > 27  < > 27 27 25 26   GLUCOSE 195*  < > 167*  < > 216* 201* 235* 196*  BUN 110*  < > 45*  < > 34* 32* 30* 28*  CREATININE 4.61*  < > 1.78*  < > 1.94* 1.90* 1.93* 1.85*  CALCIUM 8.4  < > 8.4  < > 7.7* 7.9* 7.8* 8.1*  MG 1.8  --  2.0  --   --   --   --   --   PHOS 5.3*  < > 1.4*  < > 1.8* 2.0*  --  2.5  < > = values in this interval not displayed. Liver Function Tests:  Recent Labs  08/05/12 0115 08/13/12 2100  08/14/12 0830  08/16/12 0300 08/16/12 1100 08/17/12 0325  AST 11 15  --  10  --   --   --   --   ALT 9 20  --  13  --   --   --   --   ALKPHOS 56 119*  --  101  --   --   --   --   BILITOT 0.2* 0.2*  --  0.2*  --   --   --   --   PROT 7.1 7.0  --  5.7*  --   --   --   --   ALBUMIN 3.0* 2.8*  < > 2.4*  < > 2.0* 2.0* 2.0*  < > = values in this interval not displayed.  Recent Labs  08/13/12 2100  LIPASE 27  CBC:  Recent Labs  08/04/12 1641  08/13/12 2100   08/17/12 0325 08/18/12 0605 08/19/12 0535  WBC 6.6  < > 6.4  < > 6.1 7.3 7.6  NEUTROABS 5.1  --  4.8  --   --   --   --   HGB 10.6*  < > 10.0*  < > 7.6* 7.9* 8.1*  HCT 33.7*  < > 30.6*  < > 23.1* 24.5* 24.8*  MCV 94.9  < > 96.8  < > 91.7 93.2 92.2  PLT 250  < > 273  < > 152 173 183  < > = values in this interval not displayed. CBG:  Recent Labs  08/21/12 0732 08/21/12 1146 08/21/12 1701  GLUCAP 188* 239* 174*    Imaging and Procedures: CT abdomen/pelvis 08/13/12:  76mm stone in left renal pelvis w/ mild hydronephrosis, retroperitoneal adenopathy CT abdomen/pelvis 08/16/12:  Stone no longer present in left renal pelvis  Assessment/Plan 1. Nephrolithiasis -passed a stone during hospitalization-keep f/u with urology  2. Renal failure, acute on chronic -resolved, f/u bmp in a wk  3. 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 CKD (chronic  kidney disease) stage 3, GFR 30-59 ml/min -f/u bmp in a week, avoid nephrotoxic meds like nsaids  5. DIABETES MELLITUS-TYPE II -cont current therapy, monitor cbgs and adjust as needed, avoid hypoglycemia  6. Hypothyroidism -cont current synthroid  7. DVT -cont chronic coumadin therapy, is being monitored closely with regular INRs here   Labs/tests ordered:  Cbc, bmp in 1 wk, f/u urology as planned

## 2012-09-02 DIAGNOSIS — F329 Major depressive disorder, single episode, unspecified: Secondary | ICD-10-CM | POA: Diagnosis not present

## 2012-09-03 ENCOUNTER — Non-Acute Institutional Stay (SKILLED_NURSING_FACILITY): Payer: Medicare Other | Admitting: Adult Health

## 2012-09-03 ENCOUNTER — Encounter: Payer: Self-pay | Admitting: Adult Health

## 2012-09-03 DIAGNOSIS — B3749 Other urogenital candidiasis: Secondary | ICD-10-CM | POA: Diagnosis not present

## 2012-09-03 DIAGNOSIS — B3741 Candidal cystitis and urethritis: Secondary | ICD-10-CM

## 2012-09-03 DIAGNOSIS — N200 Calculus of kidney: Secondary | ICD-10-CM

## 2012-09-03 DIAGNOSIS — E785 Hyperlipidemia, unspecified: Secondary | ICD-10-CM

## 2012-09-03 NOTE — Assessment & Plan Note (Signed)
Will continue diflucan for another 7 days and will monitor if his urine does not improve will culture urine at that time.

## 2012-09-03 NOTE — Assessment & Plan Note (Signed)
Will remove his foley in the am; will reduce his oxycodone to 10 mg every 6 hours as needed for one week will then reduce to 5 mg every 6 hours as needed for one week will then stop. Will begin pyridium 100 mg every 8 hours as needed for bladder/urethra pain and will monitor his status.

## 2012-09-03 NOTE — Assessment & Plan Note (Signed)
Will stop the lopid per the discharge summary recommendation

## 2012-09-03 NOTE — Progress Notes (Signed)
Patient ID: [name], male   DOB: Aug 20, 1955, 57 y.o.   MRN: [id number]  [facility]  No Known Allergies    Chief Complaint  Patient presents with  . Acute Visit    patient concerns    HPI: He is complaining of meatus pain; which is not being managed with his current regimen. The staff reports that he is taking oxycodone 15 mg every 4 hours routinely for pain. He had renal stones which he has passed. We discussed removing his foley which will be done in the am. We had a prolonged discussion regarding his pain management; he is not happy with having his medications reduced but understands.   Past Medical History  Diagnosis Date  . Sleep apnea     uses cpap  . Hypothyroidism   . Anemia   . Blood transfusion   . Chronic kidney disease     hx of kidney stones  . Hypertension   . Arthritis     osteoarthritis  . Pneumonia   . Diabetes mellitus     insulin dependent  . DVT (deep vein thrombosis) in pregnancy     Past Surgical History  Procedure Laterality Date  . Bilteral hip      replacement & revison  's   . Knee surgery      left knee    . Tonsillectomy    . Amputation  02/07/2011    Procedure: AMPUTATION BELOW KNEE;  Surgeon: Newt Minion, MD;  Location: [facility];  Service: Orthopedics;  Laterality: Left;  Left Below Knee Amputation    VITAL SIGNS BP 108/80  Pulse 76  Ht 6' (1.829 m)  Wt 287 lb (130.182 kg)  BMI 38.92 kg/m2   Patient's Medications  New Prescriptions   No medications on file  Previous Medications   ATORVASTATIN (LIPITOR) 40 MG TABLET    Take 40 mg by mouth daily.   CYCLOBENZAPRINE (FLEXERIL) 5 MG TABLET    Take 1 tablet (5 mg total) by mouth at bedtime.   FERROUS SULFATE 325 (65 FE) MG TABLET    Take 325 mg by mouth 2 (two) times daily.     FLUCONAZOLE (DIFLUCAN) 100 MG TABLET    Take 1 tablet (100 mg total) by mouth daily. For 14 more doses   FOLIC ACID PO    Take 1 tablet by mouth daily.     GEMFIBROZIL (LOPID) 600 MG TABLET    Take 600  mg by mouth 2 (two) times daily before a meal.   INSULIN GLARGINE (LANTUS) 100 UNIT/ML INJECTION    Inject 0.2 mLs (20 Units total) into the skin 2 (two) times daily.   LEVOTHYROXINE (SYNTHROID, LEVOTHROID) 200 MCG TABLET    Take 200 mcg by mouth daily.     LIDOCAINE (XYLOCAINE) 2 % JELLY    Apply topically every 8 (eight) hours as needed (urethral pain).   MIRABEGRON ER (MYRBETRIQ) 25 MG TB24    Take 1 tablet (25 mg total) by mouth daily.   MULTIPLE VITAMINS-MINERALS (MULTIVITAMINS THER. W/MINERALS) TABS    Take 1 tablet by mouth daily.     OMEGA-3 FATTY ACIDS (FISH OIL PO)    Take 3 tablets by mouth 2 (two) times daily.     OXYCODONE (ROXICODONE) 15 MG IMMEDIATE RELEASE TABLET    Take 1 tablet (15 mg total) by mouth every 4 (four) hours as needed.   PANTOPRAZOLE (PROTONIX) 40 MG TABLET    Take 1 tablet (40 mg total) by mouth  daily at 12 noon.   TAMSULOSIN (FLOMAX) 0.4 MG CAPS    Take 1 capsule (0.4 mg total) by mouth daily after supper.   WARFARIN (COUMADIN) 3 MG TABLET    Take 3-4.5 mg by mouth daily. 3mg , 3mg , 4.5mg  then repeat  Modified Medications   Modified Medication Previous Medication   INSULIN ASPART (NOVOLOG) 100 UNIT/ML INJECTION insulin aspart (NOVOLOG) 100 UNIT/ML injection      Inject 5 Units into the skin 3 (three) times daily with meals. 5 units prior to meals with an additional 5 units prior to meals for cbg >= 150    Inject 0-15 Units into the skin 3 (three) times daily with meals.  Discontinued Medications   INSULIN ASPART (NOVOLOG) 100 UNIT/ML INJECTION    Inject 0-5 Units into the skin at bedtime.    SIGNIFICANT DIAGNOSTIC EXAMS   08-28-12: wbc 5.3; hgb 9.2; hct 27.6; mcv 88.5; plt 262; glucose 150; bun 29; creat 2.15; k+ 4.2 Na++ 136 08-31-12: wbc 1.4; hgb 8.7; hct 25.8; mcv 87.2; plt 271; glucose 148; bun 28; creat 2.28; k+ 4.6 Na+ 133    Review of Systems  Constitutional: Negative for malaise/fatigue.  Respiratory: Negative for cough and shortness of breath.    Cardiovascular: Negative for chest pain.  Gastrointestinal: Negative for abdominal pain and constipation.  Genitourinary:       Meatus  pain for which oxycodone is not effective and lidocaine jelly helps slightly   Musculoskeletal: Negative for myalgias and joint pain.  Skin: Negative.   Neurological: Negative for headaches.  Psychiatric/Behavioral: Negative for depression. The patient does not have insomnia.    Physical Exam  Constitutional: He is oriented to person, place, and time. He appears well-developed and well-nourished.  obese  Neck: Neck supple. No thyromegaly present.  Cardiovascular: Normal rate, regular rhythm and intact distal pulses.   Respiratory: Effort normal and breath sounds normal. No respiratory distress.  GI: Soft. Bowel sounds are normal. He exhibits no distension. There is no tenderness.  Genitourinary: Penis normal.  Urine is dark yellow; and mucus present  Musculoskeletal: Normal range of motion. He exhibits no edema.  Neurological: He is alert and oriented to person, place, and time.  Skin: Skin is warm and dry.  Psychiatric: He has a normal mood and affect.      ASSESSMENT/ PLAN:  Nephrolithiasis Will remove his foley in the am; will reduce his oxycodone to 10 mg every 6 hours as needed for one week will then reduce to 5 mg every 6 hours as needed for one week will then stop. Will begin pyridium 100 mg every 8 hours as needed for bladder/urethra pain and will monitor his status.   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   HYPERLIPIDEMIA Will stop the lopid per the discharge summary recommendation    Time spent with patient 45 minutes

## 2012-09-16 ENCOUNTER — Non-Acute Institutional Stay (SKILLED_NURSING_FACILITY): Payer: Medicare Other | Admitting: Internal Medicine

## 2012-09-16 ENCOUNTER — Encounter: Payer: Self-pay | Admitting: Internal Medicine

## 2012-09-16 DIAGNOSIS — R109 Unspecified abdominal pain: Secondary | ICD-10-CM | POA: Diagnosis not present

## 2012-09-16 DIAGNOSIS — F329 Major depressive disorder, single episode, unspecified: Secondary | ICD-10-CM | POA: Diagnosis not present

## 2012-09-16 DIAGNOSIS — D649 Anemia, unspecified: Secondary | ICD-10-CM

## 2012-09-16 DIAGNOSIS — N183 Chronic kidney disease, stage 3 unspecified: Secondary | ICD-10-CM

## 2012-09-16 NOTE — Assessment & Plan Note (Signed)
BUN 35/Cr 2.53- continue to creep up.Pt says he was supposed to have had a 24 hour urine he said. Really need to push fluids and now that pt is having urinary sx and has had hx ARF with dialysis from obstruction will order BMP today.

## 2012-09-16 NOTE — Progress Notes (Signed)
MRN: [id number] Name: [name]  Sex: male Age: 57 y.o. DOB: 11-17-1955  [place] #:  Facility/Room: Level Of Care: SNF Provider: Inocencio Homes D Emergency Contacts: Extended Emergency Contact Information Primary Emergency Contact: Luginbill,Sandra Address: [address]          [place], [place] [address] Johnnette Litter of [place] Phone: [phone] Work Phone: [phone] Mobile Phone: [phone] Relation: Spouse   Allergies: Review of patient's allergies indicates no known allergies.  Chief Complaint  Patient presents with  . Abdominal Pain    low abd pain and frequency and dysuria    HPI: Patient is 57 y.o. male who has had low abd pain ,freq and dyuria today.Denies flank pain, testicular pain , nausea or fever. Pt says he has had a long hx of this , work up began in January but nobody can find out what is wrong. Pt does have hx kidney stone, and stents.  Past Medical History  Diagnosis Date  . Sleep apnea     uses cpap  . Hypothyroidism   . Anemia   . Blood transfusion   . Chronic kidney disease     hx of kidney stones  . Hypertension   . Arthritis     osteoarthritis  . Pneumonia   . Diabetes mellitus     insulin dependent  . DVT (deep vein thrombosis) in pregnancy     Past Surgical History  Procedure Laterality Date  . Bilteral hip      replacement & revison  's   . Knee surgery      left knee    . Tonsillectomy    . Amputation  02/07/2011    Procedure: AMPUTATION BELOW KNEE;  Surgeon: Newt Minion, MD;  Location: [facility];  Service: Orthopedics;  Laterality: Left;  Left Below Knee Amputation      Medication List       This list is accurate as of: 09/16/12  5:50 PM.  Always use your most recent med list.               atorvastatin 40 MG tablet  Commonly known as:  LIPITOR  Take 40 mg by mouth daily.     cyclobenzaprine 5 MG tablet  Commonly known as:  FLEXERIL  Take 1 tablet (5 mg total) by mouth at bedtime.     ferrous sulfate 325 (65  FE) MG tablet  Take 325 mg by mouth 2 (two) times daily.     FISH OIL PO  Take 3 tablets by mouth 2 (two) times daily.     fluconazole 100 MG tablet  Commonly known as:  DIFLUCAN  Take 1 tablet (100 mg total) by mouth daily. For 14 more doses     FOLIC ACID PO  Take 1 tablet by mouth daily.     gemfibrozil 600 MG tablet  Commonly known as:  LOPID  Take 600 mg by mouth 2 (two) times daily before a meal.     insulin aspart 100 UNIT/ML injection  Commonly known as:  novoLOG  Inject 5 Units into the skin 3 (three) times daily with meals. 5 units prior to meals with an additional 5 units prior to meals for cbg >= 150     insulin glargine 100 UNIT/ML injection  Commonly known as:  LANTUS  Inject 0.2 mLs (20 Units total) into the skin 2 (two) times daily.     levothyroxine 200 MCG tablet  Commonly known as:  SYNTHROID, LEVOTHROID  Take 200  mcg by mouth daily.     lidocaine 2 % jelly  Commonly known as:  XYLOCAINE  Apply topically every 8 (eight) hours as needed (urethral pain).     mirabegron ER 25 MG Tb24  Commonly known as:  MYRBETRIQ  Take 1 tablet (25 mg total) by mouth daily.     multivitamins ther. w/minerals Tabs  Take 1 tablet by mouth daily.     oxyCODONE 15 MG immediate release tablet  Commonly known as:  ROXICODONE  Take 1 tablet (15 mg total) by mouth every 4 (four) hours as needed.     pantoprazole 40 MG tablet  Commonly known as:  PROTONIX  Take 1 tablet (40 mg total) by mouth daily at 12 noon.     tamsulosin 0.4 MG Caps  Commonly known as:  FLOMAX  Take 1 capsule (0.4 mg total) by mouth daily after supper.     warfarin 3 MG tablet  Commonly known as:  COUMADIN  Take 3-4.5 mg by mouth daily. 3mg , 3mg , 4.5mg  then repeat        No orders of the defined types were placed in this encounter.    Immunization History  Administered Date(s) Administered  . Pneumococcal Polysaccharide 08/15/2012    History  Substance Use Topics  . Smoking status:  Never Smoker   . Smokeless tobacco: Never Used  . Alcohol Use: No    Family history is noncontributory    Review of Systems  DATA OBTAINED: from patient, nurse GENERAL: no fevers, fatigue, appetite changes SKIN: No itching, rash or wounds EYES: No eye pain, redness, discharge EARS: No earache, tinnitus, change in hearing NOSE: No congestion, drainage or bleeding  MOUTH/THROAT: No mouth or tooth pain, No sore throat, No difficulty chewing or swallowing  RESPIRATORY: No cough, wheezing, SOB CARDIAC: No chest pain, palpitations, lower extremity edema  GI: No abdominal pain, No N/V/D or constipation, No heartburn or reflux  GU: + dysuria, frequency or urgency, no incontinence  MUSCULOSKELETAL: No unrelieved bone/joint pain NEUROLOGIC: Awake, alert, appropriate to situation, No change in mental status. Moves all four, no focal deficits PSYCHIATRIC: No overt anxiety or sadness. Sleeps well. No behavior issue.    Filed Vitals:   09/16/12 1733  BP: 141/80  Pulse: 90  Temp: 98 F (36.7 C)  Resp: 20    Physical Exam  GENERAL APPEARANCE: Alert, conversant. Appropriately groomed. No acute distress.  SKIN: No diaphoresis rash, or wounds HEAD: Normocephalic, atraumatic  EYES: Conjunctiva/lids clear. Pupils round, reactive. EOMs intact.  EARS: External exam WNL, canals clear. Hearing grossly normal.  NOSE: No deformity or discharge.  MOUTH/THROAT: Lips w/o lesions. Mouth and throat normal. Tongue moist, w/o lesion.  NECK: No thyroid tenderness, enlargement or nodule  RESPIRATORY: Breathing is even, unlabored. Lung sounds are clear   CARDIOVASCULAR: Heart RRR no murmurs, rubs or gallops. No peripheral edema.  VENOUS: No varicosities.+ venous stasis skin changes  GASTROINTESTINAL: Abdomen is soft,  not distended w/ normal bowel sounds. No mass, ventral or inguinal hernia. No organomegally GENITOURINARY: Bladder is tender tender, not distended ;no CVA tender MUSCULOSKELETAL: left  BKA NEUROLOGIC: Oriented X3. Cranial nerves 2-12 grossly intact PSYCHIATRIC: Mood and affect appropriate to situation, no behavioral issues  Patient Active Problem List   Diagnosis Date Noted  . 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 CKD (chronic kidney disease) stage 3, GFR 30-59 ml/min 08/18/2012  . AKI (acute kidney injury) 08/14/2012  . Cellulitis 08/14/2012  . Nephrolithiasis 08/14/2012  . Acute respiratory failure  with hypoxia 08/14/2012  . Hyperkalemia 08/04/2012  . Metabolic acidosis [id number]  . Hypoglycemia associated with diabetes 08/04/2012  . Hypothyroidism 08/04/2012  . HYPERLIPIDEMIA 09/21/2009  . OBESITY 09/21/2009  . ACUTE POSTHEMORRHAGIC ANEMIA 09/21/2009  . ANEMIA-UNSPECIFIED 09/21/2009  . HYPERTENSION 09/21/2009  . DIVERTICULOSIS OF COLON 09/21/2009  . BLOOD IN STOOL 09/21/2009  . DIABETES MELLITUS-TYPE II 10/01/2007  . DVT 10/01/2007      Assessment/Plan LOW ABD PAIN- most likely pt has a UTI-getting a stat urine with C and S and also BMP to make sure this is not worsening CRD   ANEMIA-UNSPECIFIED Reviewed CBC from 7/18-H/H 8.6/26 which is actually improved from prior so will continue iron  CKD (chronic kidney disease) stage 3, GFR 30-59 ml/min BUN 35/Cr 2.53- continue to creep up.Pt says he was supposed to have had a 24 hour urine he said. Really need to push fluids and now that pt is having urinary sx and has had hx ARF with dialysis from obstruction will order BMP today.    Hennie Duos, MD

## 2012-09-16 NOTE — Assessment & Plan Note (Addendum)
Reviewed CBC from 7/18-H/H 8.6/26 which is actually improved from prior so will continue iron

## 2012-09-17 ENCOUNTER — Emergency Department (HOSPITAL_COMMUNITY)
Admission: EM | Admit: 2012-09-17 | Discharge: 2012-09-18 | Disposition: A | Discharge status: Skilled Nursing Facility | Payer: Medicare Other | Attending: Emergency Medicine | Admitting: Emergency Medicine

## 2012-09-17 ENCOUNTER — Encounter (HOSPITAL_COMMUNITY): Payer: Self-pay | Admitting: Physical Medicine and Rehabilitation

## 2012-09-17 DIAGNOSIS — Z8739 Personal history of other diseases of the musculoskeletal system and connective tissue: Secondary | ICD-10-CM | POA: Diagnosis not present

## 2012-09-17 DIAGNOSIS — Z9981 Dependence on supplemental oxygen: Secondary | ICD-10-CM | POA: Diagnosis not present

## 2012-09-17 DIAGNOSIS — R319 Hematuria, unspecified: Secondary | ICD-10-CM | POA: Insufficient documentation

## 2012-09-17 DIAGNOSIS — E119 Type 2 diabetes mellitus without complications: Secondary | ICD-10-CM | POA: Diagnosis not present

## 2012-09-17 DIAGNOSIS — E039 Hypothyroidism, unspecified: Secondary | ICD-10-CM | POA: Insufficient documentation

## 2012-09-17 DIAGNOSIS — Z79899 Other long term (current) drug therapy: Secondary | ICD-10-CM | POA: Diagnosis not present

## 2012-09-17 DIAGNOSIS — Z794 Long term (current) use of insulin: Secondary | ICD-10-CM | POA: Insufficient documentation

## 2012-09-17 DIAGNOSIS — G4730 Sleep apnea, unspecified: Secondary | ICD-10-CM | POA: Diagnosis not present

## 2012-09-17 DIAGNOSIS — R1084 Generalized abdominal pain: Secondary | ICD-10-CM | POA: Diagnosis not present

## 2012-09-17 DIAGNOSIS — D649 Anemia, unspecified: Secondary | ICD-10-CM | POA: Diagnosis not present

## 2012-09-17 DIAGNOSIS — Z8701 Personal history of pneumonia (recurrent): Secondary | ICD-10-CM | POA: Diagnosis not present

## 2012-09-17 DIAGNOSIS — I129 Hypertensive chronic kidney disease with stage 1 through stage 4 chronic kidney disease, or unspecified chronic kidney disease: Secondary | ICD-10-CM | POA: Insufficient documentation

## 2012-09-17 DIAGNOSIS — R300 Dysuria: Secondary | ICD-10-CM | POA: Diagnosis not present

## 2012-09-17 DIAGNOSIS — Z7901 Long term (current) use of anticoagulants: Secondary | ICD-10-CM | POA: Insufficient documentation

## 2012-09-17 DIAGNOSIS — R52 Pain, unspecified: Secondary | ICD-10-CM | POA: Diagnosis not present

## 2012-09-17 DIAGNOSIS — R109 Unspecified abdominal pain: Secondary | ICD-10-CM | POA: Diagnosis not present

## 2012-09-17 DIAGNOSIS — N189 Chronic kidney disease, unspecified: Secondary | ICD-10-CM | POA: Insufficient documentation

## 2012-09-17 LAB — BASIC METABOLIC PANEL
CO2: 22 mEq/L (ref 19–32)
Calcium: 9.1 mg/dL (ref 8.4–10.5)
Chloride: 99 mEq/L (ref 96–112)
Creatinine, Ser: 1.78 mg/dL — ABNORMAL HIGH (ref 0.50–1.35)
GFR calc Af Amer: 48 mL/min — ABNORMAL LOW (ref >90)
Sodium: 133 mEq/L — ABNORMAL LOW (ref 135–145)

## 2012-09-17 LAB — GLUCOSE, CAPILLARY: Glucose-Capillary: 108 mg/dL — ABNORMAL HIGH (ref 70–99)

## 2012-09-17 LAB — CBC WITH DIFFERENTIAL/PLATELET
Basophils Absolute: 0 10*3/uL (ref 0.0–0.1)
Lymphocytes Relative: 12 % (ref 12–46)
Lymphs Abs: 0.9 10*3/uL (ref 0.7–4.0)
Neutro Abs: 6 10*3/uL (ref 1.7–7.7)
Platelets: 239 10*3/uL (ref 150–400)
RBC: 3.66 MIL/uL — ABNORMAL LOW (ref 4.22–5.81)
RDW: 15.8 % — ABNORMAL HIGH (ref 11.5–15.5)
WBC: 7.7 10*3/uL (ref 4.0–10.5)

## 2012-09-17 LAB — URINALYSIS, ROUTINE W REFLEX MICROSCOPIC
Bilirubin Urine: NEGATIVE
Nitrite: NEGATIVE
Specific Gravity, Urine: 1.014 (ref 1.005–1.030)
Urobilinogen, UA: 1 mg/dL (ref 0.0–1.0)
pH: 6 (ref 5.0–8.0)

## 2012-09-17 LAB — URINE MICROSCOPIC-ADD ON

## 2012-09-17 MED ORDER — HYDROMORPHONE HCL PF 1 MG/ML IJ SOLN
0.5000 mg | Freq: Once | INTRAMUSCULAR | Status: AC
  Administered 2012-09-17: 0.5 mg via INTRAVENOUS
  Filled 2012-09-17: qty 1

## 2012-09-17 MED ORDER — SODIUM CHLORIDE 0.9 % IV BOLUS (SEPSIS)
1000.0000 mL | Freq: Once | INTRAVENOUS | Status: AC
  Administered 2012-09-17: 1000 mL via INTRAVENOUS

## 2012-09-17 NOTE — ED Notes (Signed)
Used bladder scanner on pt, pt still has approx 83ml of urine left in bladder at time of scan.

## 2012-09-17 NOTE — ED Notes (Signed)
PA at bedside.

## 2012-09-17 NOTE — ED Provider Notes (Signed)
History    CSN: [id number] Arrival date & time 09/17/12  1827  First MD Initiated Contact with Patient 09/17/12 1832     Chief Complaint  Patient presents with  . Dysuria   (Consider location/radiation/quality/duration/timing/severity/associated sxs/prior Treatment) HPI Comments: Patient is a 57 y/o male with a hx of Stage III CKD, HTN, and DM who presents from a SNF for suprapubic pain x 3 days. Patient states he has had this pain for several months, but it has been worsening recently. Patient describes the pain as burning; "like they stuck a match up my urethra". Patient denies modifying factors. He states he has also been passing clots and gross blood in his urine intermittently. Further denies fevers, CP, SOB, N/V, diarrhea, constipation, melena, and hematochezia. Patient followed by [name] of [facility]. Patient states he takes oxycodone for his chronic pain, but that [name] has been attempting to wean down his dosage over the last few days; says Tylenol providing him no relief. Was most recently hospitalized on 08/13/12 for 8 days for AKI and uremia; intubated during ED stay.   Patient is a 57 y.o. male presenting with dysuria. The history is provided by the patient. No language interpreter was used.  Dysuria Associated symptoms include abdominal pain (suprapubic). Pertinent negatives include no chest pain, fever, nausea, numbness, vomiting or weakness.   Past Medical History  Diagnosis Date  . Sleep apnea     uses cpap  . Hypothyroidism   . Anemia   . Blood transfusion   . Chronic kidney disease     hx of kidney stones  . Hypertension   . Arthritis     osteoarthritis  . Pneumonia   . Diabetes mellitus     insulin dependent  . DVT (deep vein thrombosis) in pregnancy    Past Surgical History  Procedure Laterality Date  . Bilteral hip      replacement & revison  's   . Knee surgery      left knee    . Tonsillectomy    . Amputation  02/07/2011   Procedure: AMPUTATION BELOW KNEE;  Surgeon: Newt Minion, MD;  Location: [facility];  Service: Orthopedics;  Laterality: Left;  Left Below Knee Amputation   Family History  Problem Relation Age of Onset  . Diabetes Mother   . Colon cancer Father    History  Substance Use Topics  . Smoking status: Never Smoker   . Smokeless tobacco: Never Used  . Alcohol Use: No    Review of Systems  Constitutional: Negative for fever.  Respiratory: Negative for shortness of breath.   Cardiovascular: Negative for chest pain.  Gastrointestinal: Positive for abdominal pain (suprapubic). Negative for nausea and vomiting.  Genitourinary: Positive for dysuria, hematuria and flank pain.  Neurological: Negative for weakness and numbness.  All other systems reviewed and are negative.   Allergies  Review of patient's allergies indicates no known allergies.  Home Medications   Current Outpatient Rx  Name  Route  Sig  Dispense  Refill  . acetaminophen (TYLENOL) 325 MG tablet   Oral   Take 650 mg by mouth every 4 (four) hours as needed for pain (dont exceed 3000mg  per 24 hour period).         Marland Kitchen atorvastatin (LIPITOR) 40 MG tablet   Oral   Take 40 mg by mouth daily.         . cyclobenzaprine (FLEXERIL) 5 MG tablet   Oral   Take 1 tablet (  5 mg total) by mouth at bedtime.   30 tablet   0   . ferrous sulfate 325 (65 FE) MG tablet   Oral   Take 325 mg by mouth 2 (two) times daily.           Marland Kitchen FOLIC ACID PO   Oral   Take 1 tablet by mouth daily.           . insulin aspart (NOVOLOG) 100 UNIT/ML injection   Subcutaneous   Inject 5 Units into the skin 4 (four) times daily -  with meals and at bedtime. 5 units prior to meals with an additional 5 units prior to meals for cbg >= 150         . insulin glargine (LANTUS) 100 UNIT/ML injection   Subcutaneous   Inject 0.2 mLs (20 Units total) into the skin 2 (two) times daily.   10 mL   12   . levothyroxine (SYNTHROID, LEVOTHROID) 200 MCG  tablet   Oral   Take 200 mcg by mouth daily.           Marland Kitchen lidocaine (XYLOCAINE) 2 % jelly   Topical   Apply topically every 8 (eight) hours as needed (urethral pain).   30 mL   0   . miconazole (MICOTIN) 2 % cream   Topical   Apply 1 application topically every 8 (eight) hours as needed (for wound).         . mirabegron ER (MYRBETRIQ) 25 MG TB24   Oral   Take 1 tablet (25 mg total) by mouth daily.   30 tablet      . Multiple Vitamins-Minerals (MULTIVITAMINS THER. W/MINERALS) TABS   Oral   Take 1 tablet by mouth daily.           . Omega-3 Fatty Acids (FISH OIL PO)   Oral   Take 3 tablets by mouth 2 (two) times daily.           Marland Kitchen oxyCODONE (OXY IR/ROXICODONE) 5 MG immediate release tablet   Oral   Take 5 mg by mouth every 4 (four) hours as needed for pain.         . pantoprazole (PROTONIX) 40 MG tablet   Oral   Take 1 tablet (40 mg total) by mouth daily at 12 noon.         . phenazopyridine (PYRIDIUM) 100 MG tablet   Oral   Take 100 mg by mouth 3 (three) times daily as needed for pain (for candidiasis).         . tamsulosin (FLOMAX) 0.4 MG CAPS   Oral   Take 1 capsule (0.4 mg total) by mouth daily after supper.   30 capsule      . traMADol (ULTRAM) 50 MG tablet   Oral   Take 50 mg by mouth once.         . warfarin (COUMADIN) 3 MG tablet   Oral   Take 3 mg by mouth daily as needed (Depending on INR, usually takes 3mg  for 2 sequential days.).           BP 127/89  Pulse 94  Temp(Src) 98.6 F (37 C) (Oral)  Resp 33  SpO2 97%  Physical Exam  Nursing note and vitals reviewed. Constitutional: He is oriented to person, place, and time.  Morbidly obese, chronically ill appearing  HENT:  Head: Normocephalic and atraumatic.  Eyes: Conjunctivae and EOM are normal. No scleral icterus.  Neck: Normal range of motion.  Cardiovascular: Normal rate, regular rhythm and normal heart sounds.   Pulmonary/Chest: Effort normal and breath sounds normal. No  respiratory distress. He has no wheezes. He has no rales.  Abdominal: Soft. He exhibits no mass.  Morbidly obese abdomen. Diffusely tender abdomen, more significant in suprapubic region. Voluntary guarding with palpation to b/l lower abdomen. No peritoneal signs.  Musculoskeletal: Normal range of motion.  Neurological: He is alert and oriented to person, place, and time.  Skin: Skin is warm and dry. No rash noted. No erythema. There is pallor.  Psychiatric: He has a normal mood and affect. His behavior is normal.    ED Course  Procedures (including critical care time) Labs Reviewed  BASIC METABOLIC PANEL - Abnormal; Notable for the following:    Sodium 133 (*)    Glucose, Bld 110 (*)    BUN 32 (*)    Creatinine, Ser 1.78 (*)    GFR calc non Af Amer 42 (*)    GFR calc Af Amer 48 (*)    All other components within normal limits  CBC WITH DIFFERENTIAL - Abnormal; Notable for the following:    RBC 3.66 (*)    Hemoglobin 10.6 (*)    HCT 31.7 (*)    RDW 15.8 (*)    All other components within normal limits  GLUCOSE, CAPILLARY - Abnormal; Notable for the following:    Glucose-Capillary 108 (*)    All other components within normal limits  URINALYSIS, ROUTINE W REFLEX MICROSCOPIC - Abnormal; Notable for the following:    Color, Urine AMBER (*)    APPearance TURBID (*)    Hgb urine dipstick LARGE (*)    Protein, ur 100 (*)    Leukocytes, UA LARGE (*)    All other components within normal limits  URINE MICROSCOPIC-ADD ON - Abnormal; Notable for the following:    Bacteria, UA FEW (*)    All other components within normal limits  URINE CULTURE   No results found.  1. Dysuria    MDM  Patient presents for chronic dysuria without fever, chills, or myalgias; followed by [name]. Physical exam as above. He is well and nontoxic appearing. Work up to include CBC, BMP, UA, and CBG.   Labs c/w baseline and creatinine improved from 1 month prior. There is no leukocytosis and H/H also  stable compared to priors. UA findings appear to be at baseline. See no reason to start patient on abx at this time given few number of bacteria found in urine and negative nitrites. Urine appears to chronically have these findings. Given work up, believe patient stable for d/c with urology follow up.   Patient continuing to request narcotic pain medicines. States "I can't do my rehab if I'm in pain". Question drug seeking motive behind visit today. Patient has been told by both myself and Dr. Reather Converse that he will not be discharged with a Rx for narcotics; have verbalized that patient must speak with his urologist regarding his current pain regimen. Stable for d/c. Patient seen also by Dr. Reather Converse who is in agreement with this work up, assessment/plan, and patient's stability for d/c.   Antonietta Breach, PA-C 09/18/12 0036

## 2012-09-17 NOTE — ED Notes (Signed)
Md Zavitz at bedside

## 2012-09-17 NOTE — ED Notes (Signed)
Pt presents to department from [facility] via [facility] for evaluation of dysuria, urinary frequency and hematuria. Ongoing for several months, pain has become worse the last several days. Denies abdominal pain. He is alert and oriented x4.

## 2012-09-17 NOTE — ED Notes (Signed)
Pt reports severe pain and pressure with urination. No relief with Tylenol at facility. Also states blood in urine and increased frequency of urination. Also states lower abdominal pain. 5/10 pain at the time to urethra. Pt is alert and oriented x4.

## 2012-09-18 DIAGNOSIS — I119 Hypertensive heart disease without heart failure: Secondary | ICD-10-CM | POA: Diagnosis not present

## 2012-09-18 DIAGNOSIS — R300 Dysuria: Secondary | ICD-10-CM | POA: Diagnosis not present

## 2012-09-18 NOTE — ED Notes (Signed)
Md Zavit at bedside.

## 2012-09-18 NOTE — ED Notes (Signed)
Primary care nurse at [facility] made aware of pt's condition and pt's transfer back to facility. [facility] called for transportation.

## 2012-09-19 LAB — URINE CULTURE: Culture: NO GROWTH

## 2012-09-19 NOTE — ED Provider Notes (Addendum)
Medical screening examination/treatment/procedure(s) were conducted as a shared visit with non-physician practitioner(s) or resident  and myself.  I personally evaluated the patient during the encounter and agree with the findings and plan unless otherwise indicated.  Recurrent dysuria.  Abd soft, mild suprapubic tenderness/ obese.  Non toxic appearing.  No flank pain on palpation.  Results reviewed.  Discussed with patient no narcotic prescription from ED and close fup outpt. DC Mariea Clonts, MD 09/19/12 [id number]  Mariea Clonts, MD 10/14/12 [phone]

## 2012-09-27 DIAGNOSIS — R310 Gross hematuria: Secondary | ICD-10-CM | POA: Diagnosis not present

## 2012-09-27 DIAGNOSIS — G8929 Other chronic pain: Secondary | ICD-10-CM | POA: Diagnosis not present

## 2012-09-27 DIAGNOSIS — N183 Chronic kidney disease, stage 3 unspecified: Secondary | ICD-10-CM | POA: Diagnosis not present

## 2012-09-27 DIAGNOSIS — N400 Enlarged prostate without lower urinary tract symptoms: Secondary | ICD-10-CM | POA: Diagnosis not present

## 2012-09-27 DIAGNOSIS — S88119A Complete traumatic amputation at level between knee and ankle, unspecified lower leg, initial encounter: Secondary | ICD-10-CM | POA: Diagnosis not present

## 2012-09-27 DIAGNOSIS — N319 Neuromuscular dysfunction of bladder, unspecified: Secondary | ICD-10-CM | POA: Diagnosis not present

## 2012-09-27 DIAGNOSIS — Z86718 Personal history of other venous thrombosis and embolism: Secondary | ICD-10-CM | POA: Diagnosis not present

## 2012-09-27 DIAGNOSIS — N329 Bladder disorder, unspecified: Secondary | ICD-10-CM | POA: Diagnosis not present

## 2012-09-27 DIAGNOSIS — Z9889 Other specified postprocedural states: Secondary | ICD-10-CM | POA: Diagnosis not present

## 2012-09-27 DIAGNOSIS — N189 Chronic kidney disease, unspecified: Secondary | ICD-10-CM | POA: Diagnosis not present

## 2012-09-27 DIAGNOSIS — E119 Type 2 diabetes mellitus without complications: Secondary | ICD-10-CM | POA: Diagnosis not present

## 2012-09-27 DIAGNOSIS — G4733 Obstructive sleep apnea (adult) (pediatric): Secondary | ICD-10-CM | POA: Diagnosis not present

## 2012-09-27 DIAGNOSIS — Z8679 Personal history of other diseases of the circulatory system: Secondary | ICD-10-CM | POA: Diagnosis not present

## 2012-09-27 DIAGNOSIS — N200 Calculus of kidney: Secondary | ICD-10-CM | POA: Diagnosis not present

## 2012-09-27 DIAGNOSIS — Z5189 Encounter for other specified aftercare: Secondary | ICD-10-CM | POA: Diagnosis not present

## 2012-09-27 DIAGNOSIS — J9600 Acute respiratory failure, unspecified whether with hypoxia or hypercapnia: Secondary | ICD-10-CM | POA: Diagnosis not present

## 2012-09-27 DIAGNOSIS — N1330 Unspecified hydronephrosis: Secondary | ICD-10-CM | POA: Diagnosis not present

## 2012-09-27 DIAGNOSIS — I129 Hypertensive chronic kidney disease with stage 1 through stage 4 chronic kidney disease, or unspecified chronic kidney disease: Secondary | ICD-10-CM | POA: Diagnosis not present

## 2012-09-27 DIAGNOSIS — I739 Peripheral vascular disease, unspecified: Secondary | ICD-10-CM | POA: Diagnosis not present

## 2012-09-27 DIAGNOSIS — Z7901 Long term (current) use of anticoagulants: Secondary | ICD-10-CM | POA: Diagnosis not present

## 2012-09-27 DIAGNOSIS — S37009A Unspecified injury of unspecified kidney, initial encounter: Secondary | ICD-10-CM | POA: Diagnosis not present

## 2012-09-27 DIAGNOSIS — E039 Hypothyroidism, unspecified: Secondary | ICD-10-CM | POA: Diagnosis not present

## 2012-09-27 DIAGNOSIS — R319 Hematuria, unspecified: Secondary | ICD-10-CM | POA: Diagnosis not present

## 2012-09-27 DIAGNOSIS — F411 Generalized anxiety disorder: Secondary | ICD-10-CM | POA: Diagnosis not present

## 2012-09-27 DIAGNOSIS — R350 Frequency of micturition: Secondary | ICD-10-CM | POA: Diagnosis not present

## 2012-09-27 DIAGNOSIS — Z8701 Personal history of pneumonia (recurrent): Secondary | ICD-10-CM | POA: Diagnosis not present

## 2012-09-27 DIAGNOSIS — Z4789 Encounter for other orthopedic aftercare: Secondary | ICD-10-CM | POA: Diagnosis not present

## 2012-09-27 DIAGNOSIS — R300 Dysuria: Secondary | ICD-10-CM | POA: Diagnosis not present

## 2012-09-27 DIAGNOSIS — L97809 Non-pressure chronic ulcer of other part of unspecified lower leg with unspecified severity: Secondary | ICD-10-CM | POA: Diagnosis not present

## 2012-09-27 DIAGNOSIS — Z01818 Encounter for other preprocedural examination: Secondary | ICD-10-CM | POA: Diagnosis not present

## 2012-09-27 DIAGNOSIS — D649 Anemia, unspecified: Secondary | ICD-10-CM | POA: Diagnosis not present

## 2012-09-27 DIAGNOSIS — R1084 Generalized abdominal pain: Secondary | ICD-10-CM | POA: Diagnosis not present

## 2012-09-27 DIAGNOSIS — R109 Unspecified abdominal pain: Secondary | ICD-10-CM | POA: Diagnosis not present

## 2012-09-27 DIAGNOSIS — G4730 Sleep apnea, unspecified: Secondary | ICD-10-CM | POA: Diagnosis not present

## 2012-09-27 DIAGNOSIS — Z8739 Personal history of other diseases of the musculoskeletal system and connective tissue: Secondary | ICD-10-CM | POA: Diagnosis not present

## 2012-09-27 DIAGNOSIS — K2970 Gastritis, unspecified, without bleeding: Secondary | ICD-10-CM | POA: Diagnosis not present

## 2012-09-27 DIAGNOSIS — I890 Lymphedema, not elsewhere classified: Secondary | ICD-10-CM | POA: Diagnosis not present

## 2012-09-27 DIAGNOSIS — Z7982 Long term (current) use of aspirin: Secondary | ICD-10-CM | POA: Diagnosis not present

## 2012-09-27 DIAGNOSIS — N3000 Acute cystitis without hematuria: Secondary | ICD-10-CM | POA: Diagnosis not present

## 2012-09-27 DIAGNOSIS — I10 Essential (primary) hypertension: Secondary | ICD-10-CM | POA: Diagnosis not present

## 2012-09-27 DIAGNOSIS — L738 Other specified follicular disorders: Secondary | ICD-10-CM | POA: Diagnosis not present

## 2012-09-27 DIAGNOSIS — R3989 Other symptoms and signs involving the genitourinary system: Secondary | ICD-10-CM | POA: Diagnosis not present

## 2012-09-27 DIAGNOSIS — I82409 Acute embolism and thrombosis of unspecified deep veins of unspecified lower extremity: Secondary | ICD-10-CM | POA: Diagnosis not present

## 2012-09-27 DIAGNOSIS — Z8709 Personal history of other diseases of the respiratory system: Secondary | ICD-10-CM | POA: Diagnosis not present

## 2012-09-27 DIAGNOSIS — Z87442 Personal history of urinary calculi: Secondary | ICD-10-CM | POA: Diagnosis not present

## 2012-09-27 DIAGNOSIS — Z792 Long term (current) use of antibiotics: Secondary | ICD-10-CM | POA: Diagnosis not present

## 2012-09-27 DIAGNOSIS — E785 Hyperlipidemia, unspecified: Secondary | ICD-10-CM | POA: Diagnosis not present

## 2012-09-27 DIAGNOSIS — R10819 Abdominal tenderness, unspecified site: Secondary | ICD-10-CM | POA: Diagnosis not present

## 2012-09-27 DIAGNOSIS — Z872 Personal history of diseases of the skin and subcutaneous tissue: Secondary | ICD-10-CM | POA: Diagnosis not present

## 2012-10-02 ENCOUNTER — Other Ambulatory Visit: Payer: Self-pay | Admitting: Urology

## 2012-10-09 DIAGNOSIS — L97809 Non-pressure chronic ulcer of other part of unspecified lower leg with unspecified severity: Secondary | ICD-10-CM | POA: Diagnosis not present

## 2012-10-10 ENCOUNTER — Encounter (HOSPITAL_COMMUNITY): Payer: Self-pay | Admitting: *Deleted

## 2012-10-10 ENCOUNTER — Encounter (HOSPITAL_COMMUNITY): Payer: Self-pay | Admitting: Emergency Medicine

## 2012-10-10 ENCOUNTER — Emergency Department (HOSPITAL_COMMUNITY): Payer: Medicare Other

## 2012-10-10 ENCOUNTER — Emergency Department (HOSPITAL_COMMUNITY)
Admission: EM | Admit: 2012-10-10 | Discharge: 2012-10-10 | Disposition: A | Discharge status: Home / Self Care | Payer: Medicare Other | Attending: Emergency Medicine | Admitting: Emergency Medicine

## 2012-10-10 DIAGNOSIS — N183 Chronic kidney disease, stage 3 unspecified: Secondary | ICD-10-CM | POA: Insufficient documentation

## 2012-10-10 DIAGNOSIS — E785 Hyperlipidemia, unspecified: Secondary | ICD-10-CM | POA: Insufficient documentation

## 2012-10-10 DIAGNOSIS — Z8739 Personal history of other diseases of the musculoskeletal system and connective tissue: Secondary | ICD-10-CM | POA: Insufficient documentation

## 2012-10-10 DIAGNOSIS — R109 Unspecified abdominal pain: Secondary | ICD-10-CM | POA: Insufficient documentation

## 2012-10-10 DIAGNOSIS — Z7982 Long term (current) use of aspirin: Secondary | ICD-10-CM | POA: Insufficient documentation

## 2012-10-10 DIAGNOSIS — F411 Generalized anxiety disorder: Secondary | ICD-10-CM | POA: Insufficient documentation

## 2012-10-10 DIAGNOSIS — Z7901 Long term (current) use of anticoagulants: Secondary | ICD-10-CM | POA: Insufficient documentation

## 2012-10-10 DIAGNOSIS — G8929 Other chronic pain: Secondary | ICD-10-CM

## 2012-10-10 DIAGNOSIS — D649 Anemia, unspecified: Secondary | ICD-10-CM | POA: Insufficient documentation

## 2012-10-10 DIAGNOSIS — E039 Hypothyroidism, unspecified: Secondary | ICD-10-CM | POA: Insufficient documentation

## 2012-10-10 DIAGNOSIS — Z8701 Personal history of pneumonia (recurrent): Secondary | ICD-10-CM | POA: Insufficient documentation

## 2012-10-10 DIAGNOSIS — Z9889 Other specified postprocedural states: Secondary | ICD-10-CM | POA: Insufficient documentation

## 2012-10-10 DIAGNOSIS — Z8709 Personal history of other diseases of the respiratory system: Secondary | ICD-10-CM | POA: Insufficient documentation

## 2012-10-10 DIAGNOSIS — Z872 Personal history of diseases of the skin and subcutaneous tissue: Secondary | ICD-10-CM | POA: Insufficient documentation

## 2012-10-10 DIAGNOSIS — Z794 Long term (current) use of insulin: Secondary | ICD-10-CM | POA: Insufficient documentation

## 2012-10-10 DIAGNOSIS — N200 Calculus of kidney: Secondary | ICD-10-CM | POA: Diagnosis not present

## 2012-10-10 DIAGNOSIS — E119 Type 2 diabetes mellitus without complications: Secondary | ICD-10-CM | POA: Insufficient documentation

## 2012-10-10 DIAGNOSIS — Z87442 Personal history of urinary calculi: Secondary | ICD-10-CM | POA: Insufficient documentation

## 2012-10-10 DIAGNOSIS — I129 Hypertensive chronic kidney disease with stage 1 through stage 4 chronic kidney disease, or unspecified chronic kidney disease: Secondary | ICD-10-CM | POA: Insufficient documentation

## 2012-10-10 DIAGNOSIS — R300 Dysuria: Secondary | ICD-10-CM

## 2012-10-10 DIAGNOSIS — G4730 Sleep apnea, unspecified: Secondary | ICD-10-CM | POA: Insufficient documentation

## 2012-10-10 DIAGNOSIS — Z792 Long term (current) use of antibiotics: Secondary | ICD-10-CM | POA: Insufficient documentation

## 2012-10-10 DIAGNOSIS — Z79899 Other long term (current) drug therapy: Secondary | ICD-10-CM | POA: Insufficient documentation

## 2012-10-10 DIAGNOSIS — R319 Hematuria, unspecified: Secondary | ICD-10-CM | POA: Insufficient documentation

## 2012-10-10 DIAGNOSIS — N400 Enlarged prostate without lower urinary tract symptoms: Secondary | ICD-10-CM | POA: Insufficient documentation

## 2012-10-10 DIAGNOSIS — Z86718 Personal history of other venous thrombosis and embolism: Secondary | ICD-10-CM | POA: Insufficient documentation

## 2012-10-10 DIAGNOSIS — Z8679 Personal history of other diseases of the circulatory system: Secondary | ICD-10-CM | POA: Insufficient documentation

## 2012-10-10 LAB — URINALYSIS, ROUTINE W REFLEX MICROSCOPIC
Glucose, UA: NEGATIVE mg/dL
Ketones, ur: NEGATIVE mg/dL
Nitrite: POSITIVE — AB
Protein, ur: 100 mg/dL — AB
Specific Gravity, Urine: 1.02 (ref 1.005–1.030)
Urobilinogen, UA: 1 mg/dL (ref 0.0–1.0)
pH: 6 (ref 5.0–8.0)

## 2012-10-10 LAB — COMPREHENSIVE METABOLIC PANEL
ALT: 8 U/L (ref 0–53)
AST: 9 U/L (ref 0–37)
Albumin: 2.7 g/dL — ABNORMAL LOW (ref 3.5–5.2)
Chloride: 102 mEq/L (ref 96–112)
Creatinine, Ser: 1.75 mg/dL — ABNORMAL HIGH (ref 0.50–1.35)
Sodium: 133 mEq/L — ABNORMAL LOW (ref 135–145)
Total Bilirubin: 0.6 mg/dL (ref 0.3–1.2)

## 2012-10-10 LAB — LIPASE, BLOOD: Lipase: 11 U/L (ref 11–59)

## 2012-10-10 LAB — CBC WITH DIFFERENTIAL/PLATELET
Eosinophils Absolute: 0.2 10*3/uL (ref 0.0–0.7)
Eosinophils Relative: 3 % (ref 0–5)
Hemoglobin: 10.2 g/dL — ABNORMAL LOW (ref 13.0–17.0)
Lymphs Abs: 0.8 10*3/uL (ref 0.7–4.0)
MCH: 26.6 pg (ref 26.0–34.0)
MCV: 84.9 fL (ref 78.0–100.0)
Monocytes Relative: 9 % (ref 3–12)
Platelets: 209 10*3/uL (ref 150–400)
RBC: 3.83 MIL/uL — ABNORMAL LOW (ref 4.22–5.81)

## 2012-10-10 LAB — URINE MICROSCOPIC-ADD ON

## 2012-10-10 MED ORDER — HYDROCODONE-ACETAMINOPHEN 5-325 MG PO TABS: 1.0000 | ORAL_TABLET | Freq: Four times a day (QID) | ORAL | Status: DC | PRN

## 2012-10-10 MED ORDER — FENTANYL CITRATE 0.05 MG/ML IJ SOLN
100.0000 ug | Freq: Once | INTRAMUSCULAR | Status: AC
  Administered 2012-10-10: 100 ug via INTRAVENOUS
  Filled 2012-10-10: qty 2

## 2012-10-10 NOTE — ED Notes (Signed)
Bed: WA06 Expected date:  Expected time:  Means of arrival:  Comments: 56yo-[facility]- weakness

## 2012-10-10 NOTE — ED Provider Notes (Signed)
CSN: [id number]     Arrival date & time 10/10/12  1248 History     First MD Initiated Contact with Patient 10/10/12 1256     Chief Complaint  Patient presents with  . Abdominal Pain  . Hematuria   (Consider location/radiation/quality/duration/timing/severity/associated sxs/prior Treatment) HPI Patient presents to the emergency department with continued lower abdominal pain, and difficulty with urination.  Patient, states his pain with urination.  He states he also has blood clots in his urine.  He states that all these issues have been ongoing.  Patient denies fever, chest pain, shortness of breath, nausea, vomiting, diarrhea, headache, blurred vision, weakness, numbness, dizziness, syncope, or rash.  Patient, states, that he hasn't had a biopsy done of his bladder, in 2 weeks.  Patient, states, tramadol was not helping with his pain Past Medical History  Diagnosis Date  . Sleep apnea     uses cpap  . Hypothyroidism   . Anemia   . Blood transfusion   . Chronic kidney disease     hx of kidney stones  . Hypertension   . Arthritis     osteoarthritis  . Pneumonia   . Diabetes mellitus     insulin dependent  . DVT (deep vein thrombosis) in pregnancy   . Peripheral vascular disease   . Hyperlipidemia   . Morbid obesity   . Hypertrophy of prostate   . Acute respiratory failure   . Cellulitis and abscess of leg   . Chronic kidney disease, stage III (moderate)   . Nephrolithiasis   . Anxiety   . Chronic pain    Past Surgical History  Procedure Laterality Date  . Bilteral hip      replacement & revison  's   . Knee surgery      left knee    . Tonsillectomy    . Amputation  02/07/2011    Procedure: AMPUTATION BELOW KNEE;  Surgeon: Newt Minion, MD;  Location: [place];  Service: Orthopedics;  Laterality: Left;  Left Below Knee Amputation   Family History  Problem Relation Age of Onset  . Diabetes Mother   . Colon cancer Father    History  Substance Use Topics  . Smoking  status: Never Smoker   . Smokeless tobacco: Never Used  . Alcohol Use: No    Review of Systems All other systems negative except as documented in the HPI. All pertinent positives and negatives as reviewed in the HPI. Allergies  Review of patient's allergies indicates no known allergies.  Home Medications   Current Outpatient Rx  Name  Route  Sig  Dispense  Refill  . cephALEXin (KEFLEX) 500 MG capsule   Oral   Take 500 mg by mouth daily.         . cyclobenzaprine (FLEXERIL) 5 MG tablet   Oral   Take 1 tablet (5 mg total) by mouth at bedtime.   30 tablet   0   . ferrous sulfate 325 (65 FE) MG tablet   Oral   Take 325 mg by mouth 2 (two) times daily.           . insulin aspart (NOVOLOG) 100 UNIT/ML injection   Subcutaneous   Inject 5 Units into the skin 4 (four) times daily -  with meals and at bedtime. 5 units prior to meals with an additional 5 units prior to meals for cbg >= 150         . insulin glargine (LANTUS) 100 UNIT/ML injection  Subcutaneous   Inject 0.2 mLs (20 Units total) into the skin 2 (two) times daily.   10 mL   12   . levothyroxine (SYNTHROID, LEVOTHROID) 200 MCG tablet   Oral   Take 200 mcg by mouth daily.           Marland Kitchen lidocaine (XYLOCAINE) 2 % jelly   Topical   Apply topically every 8 (eight) hours as needed (urethral pain).   30 mL   0   . mirabegron ER (MYRBETRIQ) 25 MG TB24   Oral   Take 1 tablet (25 mg total) by mouth daily.   30 tablet      . Multiple Vitamins-Minerals (MULTIVITAMINS THER. W/MINERALS) TABS   Oral   Take 1 tablet by mouth daily.           . Omega-3 Fatty Acids (FISH OIL PO)   Oral   Take 3 tablets by mouth 2 (two) times daily.           . phenazopyridine (PYRIDIUM) 100 MG tablet   Oral   Take 100 mg by mouth 3 (three) times daily as needed for pain (for candidiasis).         . traMADol (ULTRAM) 50 MG tablet   Oral   Take 50 mg by mouth once.         Marland Kitchen acetaminophen (TYLENOL) 325 MG tablet    Oral   Take 650 mg by mouth every 4 (four) hours as needed for pain (dont exceed 3000mg  per 24 hour period).         Marland Kitchen atorvastatin (LIPITOR) 40 MG tablet   Oral   Take 40 mg by mouth daily.         Marland Kitchen FOLIC ACID PO   Oral   Take 1 tablet by mouth daily.           . miconazole (MICOTIN) 2 % cream   Topical   Apply 1 application topically every 8 (eight) hours as needed (for wound).         . pantoprazole (PROTONIX) 40 MG tablet   Oral   Take 1 tablet (40 mg total) by mouth daily at 12 noon.         . tamsulosin (FLOMAX) 0.4 MG CAPS   Oral   Take 1 capsule (0.4 mg total) by mouth daily after supper.   30 capsule      . warfarin (COUMADIN) 3 MG tablet   Oral   Take 3 mg by mouth daily as needed (Depending on INR, usually takes 3mg  for 2 sequential days.).           BP 113/67  Pulse 78  Temp(Src) 98.1 F (36.7 C) (Oral)  Resp 18  SpO2 100% Physical Exam  Nursing note and vitals reviewed. Constitutional: He is oriented to person, place, and time. He appears well-developed and well-nourished. No distress.  HENT:  Head: Normocephalic and atraumatic.  Mouth/Throat: Oropharynx is clear and moist.  Eyes: Pupils are equal, round, and reactive to light.  Neck: Normal range of motion. Neck supple.  Cardiovascular: Normal rate, regular rhythm and normal heart sounds.  Exam reveals no gallop and no friction rub.   No murmur heard. Pulmonary/Chest: Effort normal and breath sounds normal. No respiratory distress.  Abdominal: Soft. Bowel sounds are normal. He exhibits no distension. There is no tenderness.  Neurological: He is alert and oriented to person, place, and time.  Skin: Skin is warm and dry. No erythema.  ED Course   Procedures (including critical care time)  Labs Reviewed  COMPREHENSIVE METABOLIC PANEL - Abnormal; Notable for the following:    Sodium 133 (*)    Potassium 3.3 (*)    Glucose, Bld 155 (*)    Creatinine, Ser 1.75 (*)    Albumin 2.7 (*)     GFR calc non Af Amer 42 (*)    GFR calc Af Amer 49 (*)    All other components within normal limits  CBC WITH DIFFERENTIAL - Abnormal; Notable for the following:    RBC 3.83 (*)    Hemoglobin 10.2 (*)    HCT 32.5 (*)    All other components within normal limits  PROTIME-INR - Abnormal; Notable for the following:    Prothrombin Time 21.6 (*)    INR 1.94 (*)    All other components within normal limits  LIPASE, BLOOD  URINALYSIS, ROUTINE W REFLEX MICROSCOPIC   Ct Abdomen Pelvis Wo Contrast  10/10/2012   *RADIOLOGY REPORT*  Clinical Data: Abdominal pain.  Blood in urine since January getting worse.  CT ABDOMEN AND PELVIS WITHOUT CONTRAST  Technique:  Multidetector CT imaging of the abdomen and pelvis was performed following the standard protocol without intravenous contrast.  Comparison: Multiple prior exams, most recent 08/16/2012 and most remote 09/05/2006.  Findings: Fullness of the right renal collecting system and the right ureter.  No calcified right ureteral obstructing stone is identified.  Since prior examination, inflammation now noted surrounding the right renal pelvis and increase in degree of stranding right perirenal fat planes.  Left ureter remains dilated without calcified obstructing stone.  Bilateral nonobstructing renal calculi are noted.  Decompressed urinary bladder with thickened walls.  Evaluation in the pelvic region is limited by streak artifact from bilateral hip replacements.  Cause of the patient's dilated ureter is and interval development of inflammation surrounding the right kidney/collecting system is indeterminate.  There may be distal structures or bladder obstructing lesion not well delineated on the current exam.  Bilateral external iliac adenopathy suspected larger on the right measuring up to 4.4 cm.  These findings have been present since 2008. Cause indeterminate.  Heterotopic bone formation is noted surrounding the hip prosthesis greater on the left.   Heterotopic bone extends into the left iliopsoas region.  Left psoas muscles atrophic.  Dissecting cyst as a cause for appearance of external iliac region adenopathy not excluded but felt to be less likely consideration than adenopathy.  Fatty containing inguinal hernias.  Scattered colonic diverticula.  No definitive extraluminal bowel inflammatory process detected.  No evidence of free intraperitoneal air.  Prominent coronary artery calcifications.  Atherosclerotic type changes abdominal aorta and iliac arteries without aneurysmal dilation.  Gallstones measure up to 9 mm.  Evaluation of solid abdominal viscera is limited by lack of IV contrast.  Taking this limitation into account no worrisome focal hepatic, splenic, pancreatic or adrenal lesion.  Fusion lower thoracic spine.  Degenerative changes lower lumbar spine.  Minimal pleural thickening lung bases with calcification.  IMPRESSION: Fullness of the right renal collecting system and the right ureter. No calcified right ureteral obstructing stone is identified.  Since prior examination, inflammation now noted surrounding the right renal pelvis and increase in degree of stranding right perirenal fat planes.  Left ureter remains dilated without calcified obstructing stone.  Bilateral nonobstructing renal calculi are noted.  Decompressed urinary bladder with thickened walls.  Cause of the patient's dilated ureters and interval development of inflammation surrounding the right kidney/collecting system is  indeterminate.  There may be distal structures or bladder obstructing lesion not well delineated on the current exam.  Bilateral external iliac adenopathy suspected larger on the right measuring up to 4.4 cm.  These findings have been present since 2008. Cause indeterminate.   Original Report Authenticated By: Genia Del, M.D.   I spoke with Dr. Janice Norrie of urology, and we reviewed.  The CT scan findings and he felt that the patient needs followup with Dr.  Diona Fanti.  Patient also be given further pain control.  Told to return here as needed.  Patient has had several recent, urine cultures, which were negative despite having signs of UTI MDM    Brent General, PA-C 10/10/12 [place], PA-C 10/10/12 1550

## 2012-10-10 NOTE — ED Notes (Signed)
Patient transported to CT 

## 2012-10-10 NOTE — ED Notes (Addendum)
Pt is c/o abd pain with blood in urine since jan and states it is getting worse. Pt is suppose to have a test done to stretch the bladder and he wants them to do it earlier. cbg 165. Pt is from [place]

## 2012-10-11 LAB — URINE CULTURE
Colony Count: NO GROWTH
Culture: NO GROWTH

## 2012-10-11 NOTE — ED Provider Notes (Signed)
Medical screening examination/treatment/procedure(s) were performed by non-physician practitioner and as supervising physician I was immediately available for consultation/collaboration.  Virgel Manifold, MD 10/11/12 [phone]

## 2012-10-14 ENCOUNTER — Encounter (HOSPITAL_COMMUNITY): Payer: Self-pay | Admitting: Pharmacy Technician

## 2012-10-15 ENCOUNTER — Other Ambulatory Visit: Payer: Self-pay | Admitting: Geriatric Medicine

## 2012-10-15 MED ORDER — HYDROCODONE-ACETAMINOPHEN 5-325 MG PO TABS: 1.0000 | ORAL_TABLET | Freq: Four times a day (QID) | ORAL | Status: DC | PRN

## 2012-10-16 ENCOUNTER — Other Ambulatory Visit: Payer: Self-pay | Admitting: Geriatric Medicine

## 2012-10-16 DIAGNOSIS — L738 Other specified follicular disorders: Secondary | ICD-10-CM | POA: Diagnosis not present

## 2012-10-16 MED ORDER — HYDROCODONE-ACETAMINOPHEN 5-325 MG PO TABS: 1.0000 | ORAL_TABLET | Freq: Four times a day (QID) | ORAL | Status: DC | PRN

## 2012-10-17 NOTE — Progress Notes (Signed)
Instructions faxed to [facility] at [facility]

## 2012-10-22 ENCOUNTER — Encounter: Payer: Self-pay | Admitting: Internal Medicine

## 2012-10-22 ENCOUNTER — Non-Acute Institutional Stay (SKILLED_NURSING_FACILITY): Payer: Medicare Other | Admitting: Internal Medicine

## 2012-10-22 DIAGNOSIS — E785 Hyperlipidemia, unspecified: Secondary | ICD-10-CM

## 2012-10-22 DIAGNOSIS — B3749 Other urogenital candidiasis: Secondary | ICD-10-CM

## 2012-10-22 DIAGNOSIS — I10 Essential (primary) hypertension: Secondary | ICD-10-CM

## 2012-10-22 DIAGNOSIS — E119 Type 2 diabetes mellitus without complications: Secondary | ICD-10-CM

## 2012-10-22 DIAGNOSIS — E669 Obesity, unspecified: Secondary | ICD-10-CM

## 2012-10-22 DIAGNOSIS — J9600 Acute respiratory failure, unspecified whether with hypoxia or hypercapnia: Secondary | ICD-10-CM | POA: Diagnosis not present

## 2012-10-22 DIAGNOSIS — B3741 Candidal cystitis and urethritis: Secondary | ICD-10-CM

## 2012-10-22 DIAGNOSIS — J9601 Acute respiratory failure with hypoxia: Secondary | ICD-10-CM

## 2012-10-22 DIAGNOSIS — I82409 Acute embolism and thrombosis of unspecified deep veins of unspecified lower extremity: Secondary | ICD-10-CM

## 2012-10-22 DIAGNOSIS — N200 Calculus of kidney: Secondary | ICD-10-CM

## 2012-10-22 DIAGNOSIS — D649 Anemia, unspecified: Secondary | ICD-10-CM

## 2012-10-24 ENCOUNTER — Ambulatory Visit (HOSPITAL_COMMUNITY): Payer: Medicare Other

## 2012-10-24 ENCOUNTER — Encounter (HOSPITAL_COMMUNITY)
Admission: RE | Disposition: A | Discharge status: Home / Self Care | Payer: Self-pay | Source: Ambulatory Visit | Attending: Urology

## 2012-10-24 ENCOUNTER — Ambulatory Visit (HOSPITAL_COMMUNITY): Payer: Medicare Other | Admitting: Certified Registered Nurse Anesthetist

## 2012-10-24 ENCOUNTER — Encounter (HOSPITAL_COMMUNITY): Payer: Self-pay | Admitting: *Deleted

## 2012-10-24 ENCOUNTER — Observation Stay (HOSPITAL_COMMUNITY)
Admission: RE | Admit: 2012-10-24 | Discharge: 2012-10-25 | Disposition: A | Discharge status: Home / Self Care | Payer: Medicare Other | Source: Ambulatory Visit | Attending: Urology | Admitting: Urology

## 2012-10-24 ENCOUNTER — Encounter (HOSPITAL_COMMUNITY): Payer: Self-pay | Admitting: Certified Registered Nurse Anesthetist

## 2012-10-24 DIAGNOSIS — Z01818 Encounter for other preprocedural examination: Secondary | ICD-10-CM | POA: Diagnosis not present

## 2012-10-24 DIAGNOSIS — I129 Hypertensive chronic kidney disease with stage 1 through stage 4 chronic kidney disease, or unspecified chronic kidney disease: Secondary | ICD-10-CM | POA: Insufficient documentation

## 2012-10-24 DIAGNOSIS — N189 Chronic kidney disease, unspecified: Secondary | ICD-10-CM | POA: Diagnosis not present

## 2012-10-24 DIAGNOSIS — N1330 Unspecified hydronephrosis: Secondary | ICD-10-CM | POA: Diagnosis not present

## 2012-10-24 DIAGNOSIS — I739 Peripheral vascular disease, unspecified: Secondary | ICD-10-CM | POA: Insufficient documentation

## 2012-10-24 DIAGNOSIS — Z86718 Personal history of other venous thrombosis and embolism: Secondary | ICD-10-CM | POA: Insufficient documentation

## 2012-10-24 DIAGNOSIS — R350 Frequency of micturition: Secondary | ICD-10-CM | POA: Diagnosis not present

## 2012-10-24 DIAGNOSIS — E785 Hyperlipidemia, unspecified: Secondary | ICD-10-CM | POA: Insufficient documentation

## 2012-10-24 DIAGNOSIS — R300 Dysuria: Secondary | ICD-10-CM | POA: Diagnosis not present

## 2012-10-24 DIAGNOSIS — R319 Hematuria, unspecified: Secondary | ICD-10-CM | POA: Diagnosis not present

## 2012-10-24 DIAGNOSIS — R3989 Other symptoms and signs involving the genitourinary system: Principal | ICD-10-CM | POA: Insufficient documentation

## 2012-10-24 DIAGNOSIS — G4730 Sleep apnea, unspecified: Secondary | ICD-10-CM | POA: Insufficient documentation

## 2012-10-24 DIAGNOSIS — N183 Chronic kidney disease, stage 3 unspecified: Secondary | ICD-10-CM | POA: Insufficient documentation

## 2012-10-24 DIAGNOSIS — R310 Gross hematuria: Secondary | ICD-10-CM | POA: Diagnosis not present

## 2012-10-24 DIAGNOSIS — E119 Type 2 diabetes mellitus without complications: Secondary | ICD-10-CM | POA: Insufficient documentation

## 2012-10-24 DIAGNOSIS — Z87442 Personal history of urinary calculi: Secondary | ICD-10-CM | POA: Insufficient documentation

## 2012-10-24 DIAGNOSIS — S88119A Complete traumatic amputation at level between knee and ankle, unspecified lower leg, initial encounter: Secondary | ICD-10-CM | POA: Insufficient documentation

## 2012-10-24 DIAGNOSIS — I10 Essential (primary) hypertension: Secondary | ICD-10-CM | POA: Diagnosis not present

## 2012-10-24 DIAGNOSIS — N319 Neuromuscular dysfunction of bladder, unspecified: Secondary | ICD-10-CM | POA: Diagnosis not present

## 2012-10-24 DIAGNOSIS — N3000 Acute cystitis without hematuria: Secondary | ICD-10-CM | POA: Diagnosis not present

## 2012-10-24 DIAGNOSIS — E039 Hypothyroidism, unspecified: Secondary | ICD-10-CM | POA: Insufficient documentation

## 2012-10-24 HISTORY — DX: Morbid (severe) obesity due to excess calories: E66.01

## 2012-10-24 HISTORY — DX: Anxiety disorder, unspecified: F41.9

## 2012-10-24 HISTORY — DX: Benign prostatic hyperplasia without lower urinary tract symptoms: N40.0

## 2012-10-24 HISTORY — DX: Peripheral vascular disease, unspecified: I73.9

## 2012-10-24 HISTORY — DX: Calculus of kidney: N20.0

## 2012-10-24 HISTORY — DX: Hyperlipidemia, unspecified: E78.5

## 2012-10-24 HISTORY — DX: Other chronic pain: G89.29

## 2012-10-24 HISTORY — PX: CYSTOSCOPY W/ RETROGRADES: SHX1426

## 2012-10-24 LAB — BASIC METABOLIC PANEL
BUN: 24 mg/dL — ABNORMAL HIGH (ref 6–23)
Chloride: 106 mEq/L (ref 96–112)
GFR calc Af Amer: 45 mL/min — ABNORMAL LOW (ref >90)
Glucose, Bld: 142 mg/dL — ABNORMAL HIGH (ref 70–99)
Potassium: 3.7 mEq/L (ref 3.5–5.1)
Sodium: 136 mEq/L (ref 135–145)

## 2012-10-24 LAB — APTT: aPTT: 50 seconds — ABNORMAL HIGH (ref 24–37)

## 2012-10-24 LAB — PROTIME-INR
INR: 2.24 — ABNORMAL HIGH (ref 0.00–1.49)
Prothrombin Time: 24.1 seconds — ABNORMAL HIGH (ref 11.6–15.2)

## 2012-10-24 LAB — SURGICAL PCR SCREEN: Staphylococcus aureus: NEGATIVE

## 2012-10-24 LAB — GLUCOSE, CAPILLARY
Glucose-Capillary: 102 mg/dL — ABNORMAL HIGH (ref 70–99)
Glucose-Capillary: 203 mg/dL — ABNORMAL HIGH (ref 70–99)

## 2012-10-24 SURGERY — CYSTOSCOPY, WITH RETROGRADE PYELOGRAM
Anesthesia: General | Laterality: Bilateral | Wound class: Contaminated

## 2012-10-24 MED ORDER — MIDAZOLAM HCL 5 MG/5ML IJ SOLN
INTRAMUSCULAR | Status: DC | PRN
  Administered 2012-10-24: 2 mg via INTRAVENOUS

## 2012-10-24 MED ORDER — GENTAMICIN SULFATE 40 MG/ML IJ SOLN
480.0000 mg | Freq: Once | INTRAVENOUS | Status: DC
  Filled 2012-10-24: qty 12

## 2012-10-24 MED ORDER — TAMSULOSIN HCL 0.4 MG PO CAPS
0.4000 mg | ORAL_CAPSULE | Freq: Every day | ORAL | Status: DC
  Administered 2012-10-24 – 2012-10-25 (×2): 0.4 mg via ORAL
  Filled 2012-10-24 (×2): qty 1

## 2012-10-24 MED ORDER — LIDOCAINE HCL (CARDIAC) 20 MG/ML IV SOLN
INTRAVENOUS | Status: DC | PRN
  Administered 2012-10-24: 100 mg via INTRAVENOUS

## 2012-10-24 MED ORDER — LIDOCAINE HCL 2 % EX GEL
CUTANEOUS | Status: DC | PRN
  Administered 2012-10-24: 1 via URETHRAL

## 2012-10-24 MED ORDER — LACTATED RINGERS IV SOLN
INTRAVENOUS | Status: DC
  Administered 2012-10-24: 1000 mL via INTRAVENOUS

## 2012-10-24 MED ORDER — GENTAMICIN SULFATE 40 MG/ML IJ SOLN
5.0000 mg/kg | INTRAVENOUS | Status: AC
  Administered 2012-10-24: 651.2 mg via INTRAVENOUS
  Filled 2012-10-24: qty 16.28

## 2012-10-24 MED ORDER — FENTANYL CITRATE 0.05 MG/ML IJ SOLN
25.0000 ug | INTRAMUSCULAR | Status: DC | PRN
  Administered 2012-10-24 (×2): 50 ug via INTRAVENOUS

## 2012-10-24 MED ORDER — DOCUSATE SODIUM 100 MG PO CAPS
100.0000 mg | ORAL_CAPSULE | Freq: Two times a day (BID) | ORAL | Status: DC
  Administered 2012-10-24: 100 mg via ORAL
  Filled 2012-10-24 (×4): qty 1

## 2012-10-24 MED ORDER — HYDROCODONE-ACETAMINOPHEN 5-325 MG PO TABS
1.0000 | ORAL_TABLET | Freq: Four times a day (QID) | ORAL | Status: DC | PRN
  Administered 2012-10-24 – 2012-10-25 (×3): 2 via ORAL
  Filled 2012-10-24 (×3): qty 2

## 2012-10-24 MED ORDER — LEVOTHYROXINE SODIUM 200 MCG PO TABS
200.0000 ug | ORAL_TABLET | Freq: Every day | ORAL | Status: DC
  Administered 2012-10-25: 200 ug via ORAL
  Filled 2012-10-24 (×2): qty 1

## 2012-10-24 MED ORDER — PROPOFOL 10 MG/ML IV BOLUS
INTRAVENOUS | Status: DC | PRN
  Administered 2012-10-24: 50 mg via INTRAVENOUS
  Administered 2012-10-24: 150 mg via INTRAVENOUS

## 2012-10-24 MED ORDER — FENTANYL CITRATE 0.05 MG/ML IJ SOLN
INTRAMUSCULAR | Status: DC | PRN
  Administered 2012-10-24 (×4): 25 ug via INTRAVENOUS
  Administered 2012-10-24: 50 ug via INTRAVENOUS
  Administered 2012-10-24 (×2): 25 ug via INTRAVENOUS

## 2012-10-24 MED ORDER — ACETAMINOPHEN 325 MG PO TABS: 650.0000 mg | ORAL_TABLET | ORAL | Status: DC | PRN

## 2012-10-24 MED ORDER — INSULIN GLARGINE 100 UNIT/ML ~~LOC~~ SOLN
20.0000 [IU] | Freq: Two times a day (BID) | SUBCUTANEOUS | Status: DC
  Administered 2012-10-24 – 2012-10-25 (×2): 20 [IU] via SUBCUTANEOUS
  Filled 2012-10-24 (×3): qty 0.2

## 2012-10-24 MED ORDER — TRAMADOL HCL 50 MG PO TABS: 50.0000 mg | ORAL_TABLET | Freq: Four times a day (QID) | ORAL | Status: DC | PRN

## 2012-10-24 MED ORDER — STERILE WATER FOR IRRIGATION IR SOLN
Status: DC | PRN
  Administered 2012-10-24: 3000 mL

## 2012-10-24 MED ORDER — INSULIN ASPART 100 UNIT/ML ~~LOC~~ SOLN
5.0000 [IU] | Freq: Three times a day (TID) | SUBCUTANEOUS | Status: DC
  Administered 2012-10-24 – 2012-10-25 (×3): 5 [IU] via SUBCUTANEOUS

## 2012-10-24 MED ORDER — CYCLOBENZAPRINE HCL 5 MG PO TABS
5.0000 mg | ORAL_TABLET | Freq: Every day | ORAL | Status: DC
  Administered 2012-10-24: 5 mg via ORAL
  Filled 2012-10-24 (×2): qty 1

## 2012-10-24 MED ORDER — ONDANSETRON HCL 4 MG/2ML IJ SOLN
INTRAMUSCULAR | Status: DC | PRN
  Administered 2012-10-24: 4 mg via INTRAVENOUS

## 2012-10-24 MED ORDER — HYDROMORPHONE HCL PF 1 MG/ML IJ SOLN
0.5000 mg | INTRAMUSCULAR | Status: DC | PRN
  Administered 2012-10-24 – 2012-10-25 (×4): 1 mg via INTRAVENOUS
  Filled 2012-10-24 (×4): qty 1

## 2012-10-24 MED ORDER — SODIUM CHLORIDE 0.45 % IV SOLN
INTRAVENOUS | Status: DC
  Administered 2012-10-24 (×2): via INTRAVENOUS

## 2012-10-24 MED ORDER — IOHEXOL 300 MG/ML  SOLN
INTRAMUSCULAR | Status: DC | PRN
  Administered 2012-10-24: 40 mL via URETHRAL

## 2012-10-24 MED ORDER — BELLADONNA ALKALOIDS-OPIUM 16.2-60 MG RE SUPP: 1.0000 | Freq: Four times a day (QID) | RECTAL | Status: DC | PRN

## 2012-10-24 MED ORDER — IOHEXOL 300 MG/ML  SOLN
INTRAMUSCULAR | Status: AC
  Filled 2012-10-24: qty 1

## 2012-10-24 MED ORDER — MICONAZOLE NITRATE 2 % EX CREA: 1.0000 "application " | TOPICAL_CREAM | Freq: Three times a day (TID) | CUTANEOUS | Status: DC | PRN

## 2012-10-24 MED ORDER — MUPIROCIN 2 % EX OINT
TOPICAL_OINTMENT | Freq: Two times a day (BID) | CUTANEOUS | Status: DC
  Administered 2012-10-24: 1 via NASAL
  Filled 2012-10-24: qty 22

## 2012-10-24 MED ORDER — MIRABEGRON ER 25 MG PO TB24
25.0000 mg | ORAL_TABLET | Freq: Every morning | ORAL | Status: DC
  Administered 2012-10-25: 25 mg via ORAL
  Filled 2012-10-24: qty 1

## 2012-10-24 MED ORDER — ATORVASTATIN CALCIUM 40 MG PO TABS
40.0000 mg | ORAL_TABLET | Freq: Every evening | ORAL | Status: DC
  Administered 2012-10-24: 40 mg via ORAL
  Filled 2012-10-24 (×2): qty 1

## 2012-10-24 MED ORDER — FENTANYL CITRATE 0.05 MG/ML IJ SOLN
INTRAMUSCULAR | Status: AC
  Filled 2012-10-24: qty 2

## 2012-10-24 MED ORDER — PANTOPRAZOLE SODIUM 40 MG PO TBEC
40.0000 mg | DELAYED_RELEASE_TABLET | Freq: Every day | ORAL | Status: DC
  Administered 2012-10-24 – 2012-10-25 (×2): 40 mg via ORAL
  Filled 2012-10-24 (×2): qty 1

## 2012-10-24 MED ORDER — LIDOCAINE HCL 2 % EX GEL
CUTANEOUS | Status: AC
  Filled 2012-10-24: qty 10

## 2012-10-24 MED ORDER — LACTATED RINGERS IV SOLN: INTRAVENOUS | Status: DC

## 2012-10-24 SURGICAL SUPPLY — 15 items
BAG URO CATCHER STRL LF (DRAPE) ×2 IMPLANT
CATH FOLEY 2WAY SLVR  5CC 20FR (CATHETERS) ×1
CATH FOLEY 2WAY SLVR 5CC 20FR (CATHETERS) IMPLANT
CATH URET 5FR 28IN CONE TIP (BALLOONS)
CATH URET 5FR 70CM CONE TIP (BALLOONS) ×1 IMPLANT
CLOTH BEACON ORANGE TIMEOUT ST (SAFETY) ×2 IMPLANT
DRAPE CAMERA CLOSED 9X96 (DRAPES) ×2 IMPLANT
GLOVE BIOGEL M 8.0 STRL (GLOVE) ×8 IMPLANT
GOWN PREVENTION PLUS XLARGE (GOWN DISPOSABLE) ×1 IMPLANT
GOWN STRL REIN XL XLG (GOWN DISPOSABLE) ×4 IMPLANT
GUIDEWIRE STR DUAL SENSOR (WIRE) ×2 IMPLANT
MANIFOLD NEPTUNE II (INSTRUMENTS) ×2 IMPLANT
PACK CYSTO (CUSTOM PROCEDURE TRAY) ×2 IMPLANT
TUBING CONNECTING 10 (TUBING) ×2 IMPLANT
WIRE COONS/BENSON .038X145CM (WIRE) ×1 IMPLANT

## 2012-10-24 NOTE — Anesthesia Postprocedure Evaluation (Signed)
  Anesthesia Post-op Note  Patient: [name]  Procedure(s) Performed: Procedure(s) (LRB): CYSTOSCOPY WITH RETROGRADE PYELOGRAM Procedure: Cystoscopy, Bilateral Retrogarde Pyelograms, Bladder Biopsy, Hydrodistension (Bilateral)  Patient Location: PACU  Anesthesia Type: General  Level of Consciousness: awake and alert   Airway and Oxygen Therapy: Patient Spontanous Breathing  Post-op Pain: mild  Post-op Assessment: Post-op Vital signs reviewed, Patient's Cardiovascular Status Stable, Respiratory Function Stable, Patent Airway and No signs of Nausea or vomiting  Last Vitals:  Filed Vitals:   10/24/12 1215  BP: 126/84  Pulse:   Temp: 36.9 C  Resp: 19    Post-op Vital Signs: stable   Complications: No apparent anesthesia complications

## 2012-10-24 NOTE — Preoperative (Signed)
Beta Blockers   Reason not to administer Beta Blockers:Not Applicable 

## 2012-10-24 NOTE — Transfer of Care (Signed)
Immediate Anesthesia Transfer of Care Note  Patient: [name]  Procedure(s) Performed: Procedure(s) (LRB): CYSTOSCOPY WITH RETROGRADE PYELOGRAM Procedure: Cystoscopy, Bilateral Retrogarde Pyelograms, Bladder Biopsy, Hydrodistension (Bilateral)  Patient Location: PACU  Anesthesia Type: General  Level of Consciousness: sedated, patient cooperative and responds to stimulaton  Airway & Oxygen Therapy: Patient Spontanous Breathing and Patient connected to face mask oxgen  Post-op Assessment: Report given to PACU RN and Post -op Vital signs reviewed and stable  Post vital signs: Reviewed and stable  Complications: No apparent anesthesia complications

## 2012-10-24 NOTE — Anesthesia Preprocedure Evaluation (Addendum)
Anesthesia Evaluation  Patient identified by MRN, date of birth, ID band Patient awake    Reviewed: Allergy & Precautions, H&P , NPO status , Patient's Chart, lab work & pertinent test results, reviewed documented beta blocker date and time   Airway Mallampati: II TM Distance: >3 FB Neck ROM: full    Dental no notable dental hx. (+) Teeth Intact and Dental Advisory Given   Pulmonary sleep apnea and Continuous Positive Airway Pressure Ventilation ,  breath sounds clear to auscultation  Pulmonary exam normal       Cardiovascular Exercise Tolerance: Good hypertension, Rhythm:regular Rate:Normal     Neuro/Psych negative neurological ROS  negative psych ROS   GI/Hepatic negative GI ROS, Neg liver ROS,   Endo/Other  diabetes, Well Controlled, Type 2, Insulin DependentHypothyroidism Morbid obesity  Renal/GU Renal diseasenegative Renal ROSStage 3 chronic kidney disease  negative genitourinary   Musculoskeletal   Abdominal (+) + obese,   Peds  Hematology negative hematology ROS (+) Blood dyscrasia, anemia , History DVTs - on coumadin. hgb 10.2   Anesthesia Other Findings   Reproductive/Obstetrics negative OB ROS                          Anesthesia Physical Anesthesia Plan  ASA: III  Anesthesia Plan: General   Post-op Pain Management:    Induction: Intravenous  Airway Management Planned: LMA  Additional Equipment:   Intra-op Plan:   Post-operative Plan:   Informed Consent: I have reviewed the patients History and Physical, chart, labs and discussed the procedure including the risks, benefits and alternatives for the proposed anesthesia with the patient or authorized representative who has indicated his/her understanding and acceptance.   Dental Advisory Given  Plan Discussed with: CRNA and Surgeon  Anesthesia Plan Comments:         Anesthesia Quick Evaluation

## 2012-10-24 NOTE — Progress Notes (Signed)
ANTIBIOTIC CONSULT NOTE - INITIAL  Pharmacy Consult for gentamicin Indication: pyuria on cytoscopy  No Known Allergies  Patient Measurements: Height: 5\' 11"  (180.3 cm) Weight: 280 lb (127.007 kg) IBW/kg (Calculated) : 75.3 Adjusted Body Weight: 96 kg  Vital Signs: Temp: 98.1 F (36.7 C) (08/28 1330) Temp src: Oral (08/28 0845) BP: 109/69 mmHg (08/28 1330) Pulse Rate: 90 (08/28 1330)    Intake/Output from this shift: Total I/O In: 1000 [I.V.:1000] Out: 100 [Urine:100]  Labs: Estimated Creatinine Clearance: 65.5 ml/min (by C-G formula based on Cr of 1.75).   Microbiology: Recent Results (from the past 720 hour(s))  URINE CULTURE     Status: None   Collection Time    10/10/12  2:38 PM      Result Value Range Status   Specimen Description URINE, CLEAN CATCH   Final   Special Requests NONE   Final   Culture  Setup Time     Final   Value: 10/10/2012 23:30     Performed at [facility] Count     Final   Value: NO GROWTH     Performed at [facility]   Culture     Final   Value: NO GROWTH     Performed at [facility]   Report Status 10/11/2012 FINAL   Final  SURGICAL PCR SCREEN     Status: None   Collection Time    10/24/12  9:15 AM      Result Value Range Status   MRSA, PCR NEGATIVE  NEGATIVE Final   Staphylococcus aureus NEGATIVE  NEGATIVE Final   Comment:            The Xpert SA Assay (FDA     approved for NASAL specimens     in patients over 93 years of age),     is one component of     a comprehensive surveillance     program.  Test performance has     been validated by [employer] for patients greater     than or equal to 83 year old.     It is not intended     to diagnose infection nor to     guide or monitor treatment.     Assessment: 57 year old male with a complex medical history presented 8/28 for cystoscopy, HOD, bladder biopsy and bilateral retrograde ureteropyelograms. Pharmacy has been consulted to  dose gentamicin for post-op infection prophylaxis.  Patient received gentamicin 651.2mg  IV prior to procedure this AM, which is 5mg /kg of patient's total body weight  SCr elevated at 1.75 on 10/10/12, baseline appears to be ~1.9  Patient is afebrile and WBC were WNL on 8/14  Urine culture was been collected  Urine culture from 10/10/12 showed no growth  Goal of Therapy:  eradication of infection  Plan:  - gentamicin 480mg  (5mg /kg adjusted body weight) IV x 1 at 1000 on 8/29 - follow clinical course and culture results  Thank you for the consult.  Johny Drilling, PharmD Clinical Pharmacist Pager: [phone] Pharmacy: [phone] 10/24/2012 4:02 PM

## 2012-10-24 NOTE — Progress Notes (Signed)
Patient has home CPAP machine at bedside. No anomaly noted with cords. Appears to function properly. Patient insists he use his home machine instead of a hospital supplied one. He does not know his exact home settings, but states he uses 2L supplemental O2 at night. No humidifier noted at this time. Patient denies the use of humidification at home. Machine powered via red electrical port. [facility] notified via service response for equipment check. VSS at this time.

## 2012-10-24 NOTE — Care Management Note (Addendum)
    Page 1 of 2   10/25/2012     10:59:34 AM   CARE MANAGEMENT NOTE 10/25/2012  Patient:  OLIVE, CHAKRABARTI   Account Number:  [id number]  Date Initiated:  10/24/2012  Documentation initiated by:  [facility]  Subjective/Objective Assessment:   57 Y/O M ADMITTED W/PAINFUL BLADDER,HEMATURIA,URINARY INCONTINENCE,RECURRENT UTI'S.HX:CKD.     Action/Plan:   FROM SNF.HAS CANE,RW,3N1.   Anticipated DC Date:  10/25/2012   Anticipated DC Plan:  [place]  CM consult      Choice offered to / List presented to:  C-1 Patient        [place] arranged  HH-1 RN  [place].   Status of service:  Completed, signed off Medicare Important Message given?   (If response is "NO", the following Medicare IM given date fields will be blank) Date Medicare IM given:   Date Additional Medicare IM given:    Discharge Disposition:  [place]  Per UR Regulation:  Reviewed for med. necessity/level of care/duration of stay  If discussed at [place] of Stay Meetings, dates discussed:    Comments:  10/25/12 Lynsi Dooner RN,BSN [id number] NOTIFIED THAT PATIENT WANTS HH.SPOKE TO PATIENT ABOUT [facility] SERVICES-INFORMED HIM THAT SERVICES ARE LIMITED/SKILLED/INSTRUCTION ON THE SKILL.HHRN-INSTRUCTION ON F/C DRAINAGE BAG SYSTEM,HHPT/OT-INSTRUCTION ON EXERCISES.PATIENT VOICED UNDERSTANDING.ALSO EXPLAINED THAT THEY DO NOT MAKE HOME VISIT DAY OF D/C,THEY HAVE UP TO 48HRS TO MAKE EVAL VISIT.PATIENT VOICED UNDERSTANDING.TC [facility] KRISTEN REP INFORMED OF D/C TODAY,& HH ORDERS.  10/24/12 Sujey Gundry RN,BSN NCM [phone] S/P CYSTOSCOPY.

## 2012-10-24 NOTE — H&P (Signed)
Urology History and Physical Exam  CC: Bladder pain, hematuria  HPI: 57 year old male with a complex medical history presents at this time for cystoscopy, HOD, bladder biopsy and bilateral retrograde ureteropyelograms. His history is below:  This man comes in with persistent gross hematuria, dysuria, frequency, urgency, urgency incontinence and pelvic pain. This has been bothersome for some time. The patient states that for about 7 months this is been an issue. He has seen Dr. Thomasene Mohair in [facility], Dr. Harlow Asa in [facility], had what sounds like a ureteroscopic procedure including stents a while back. He has not had any resolution of his pain or hematuria, and comes in today, having seen Dr. Tresa Moore a few weeks ago. He is not having fever, flank pain, and has had negative cultures. He is here for possible cystoscopy. CT urogram was performed revealing no evident ureteral stones. Bladder and pelvis was hard to assess secondary to interference from his hip prostheses.    Since his last visit here in May, 2014, [name] has been in the hospital twice, once for ventilator-dependent respiratory failure with acute renal insufficiency, once for renal insufficiency alone.  He was treated with a Foley catheter, and with medical intervention he improved.  It was thought that he might have passed a stone on the left-a 3 mm stone that had been in the renal pelvis but was then, on CT scan, not seen.  At his last hospitalization, he was on significant amounts of narcotics.  That included fentanyl and oxycodone.  Since his discharge, he has not been on significant narcotics, and his pain has been managed with tramadol.  He comes in for routine follow-up.  Because of his persistent bladder pain, I initially felt that he would benefit from cystoscopy, possible HOD and retrogrades.  We will hopefully be able to schedule this in the near future.   He still complains of burning in his bladder and incontinence.     PMH: Past Medical History  Diagnosis Date  . Sleep apnea     uses cpap  . Hypothyroidism   . Anemia   . Blood transfusion   . Chronic kidney disease     hx of kidney stones  . Hypertension   . Arthritis     osteoarthritis  . Pneumonia   . Diabetes mellitus     insulin dependent  . DVT (deep vein thrombosis) in pregnancy   . Peripheral vascular disease   . Hyperlipidemia   . Morbid obesity   . Hypertrophy of prostate   . Acute respiratory failure   . Cellulitis and abscess of leg   . Chronic kidney disease, stage III (moderate)   . Nephrolithiasis   . Anxiety   . Chronic pain     PSH: Past Surgical History  Procedure Laterality Date  . Bilteral hip      replacement & revison  's   . Knee surgery      left knee    . Tonsillectomy    . Amputation  02/07/2011    Procedure: AMPUTATION BELOW KNEE;  Surgeon: Newt Minion, MD;  Location: [place];  Service: Orthopedics;  Laterality: Left;  Left Below Knee Amputation    Allergies: No Known Allergies  Medications: No prescriptions prior to admission     Social History: History   Social History  . Marital Status: Married    Spouse Name: N/A    Number of Children: N/A  . Years of Education: N/A   Occupational  History  . Not on file.   Social History Main Topics  . Smoking status: Never Smoker   . Smokeless tobacco: Never Used  . Alcohol Use: No  . Drug Use: No  . Sexual Activity: Yes   Other Topics Concern  . Not on file   Social History Narrative  . No narrative on file    Family History: Family History  Problem Relation Age of Onset  . Diabetes Mother   . Colon cancer Father     Review of Systems: Positive: Bladder pain, frequency, urgency, dysuria, intermittent hematuria, LE edema, depression Negative:   A further 10 point review of systems was negative except what is listed in the HPI.  Physical Exam: @VITALS2 @ General: No acute distress.  Awake. Head:  Normocephalic.   Atraumatic. ENT:  EOMI.  Mucous membranes moist Neck:  Supple.  No lymphadenopathy. CV:  S1 present. S2 present. Regular rate. Pulmonary: Equal effort bilaterally.  Clear to auscultation bilaterally. Abdomen: Soft.  Non tender to palpation. Obese Skin:  Normal turgor.  No visible rash. Extremity: No gross deformity of bilateral upper extremities.       Studies:  No results found for this basename: HGB, WBC, PLT,  in the last 72 hours  No results found for this basename: NA, K, CL, CO2, BUN, CREATININE, CALCIUM, MAGNESIUM, GFRNONAA, GFRAA,  in the last 72 hours   No results found for this basename: PT, INR, APTT,  in the last 72 hours   No components found with this basename: ABG,     Assessment:  Bladder pain, gross hematuria, history of stones  Plan: Cystoscopy, bladder biopsy, bilateral retrograde pyelograms, HOD

## 2012-10-24 NOTE — Interval H&P Note (Signed)
History and Physical Interval Note:  10/24/2012 11:19 AM  [name]  has presented today for surgery, with the diagnosis of BLADDER PAIN, HEMATURIA  The various methods of treatment have been discussed with the patient and family. After consideration of risks, benefits and other options for treatment, the patient has consented to  Procedure(s): CYSTOSCOPY WITH RETROGRADE PYELOGRAM Procedure: Cystoscopy, Bilateral Retrogarde Pyelograms, Bladder Biopsy, Hydrodistension (Bilateral) as a surgical intervention .  The patient's history has been reviewed, patient examined, no change in status, stable for surgery.  I have reviewed the patient's chart and labs.  Questions were answered to the patient's satisfaction.     Jorja Loa

## 2012-10-24 NOTE — Op Note (Signed)
Preoperative diagnosis: History of painful bladder, recurrent urinary tract infections, urinary incontinence, history of multiple ureteral calculi, hydronephrosis  Postoperative diagnosis: Low capacity bladder, probable bilateral reflux, no evident urothelial abnormalities of the bladder, bilateral hydroureteronephrosis   Procedure: Cystoscopy, bilateral retrograde ureteropyelograms with interpretation of fluoroscopy, HOD, bladder biopsies    Surgeon: Lillette Boxer. Carlean Crowl, M.D.   Anesthesia: Gen.   Complications: None  Specimen(s): 1. Urine sent for culture 2. Random bladder biopsies  Drain(s): 57 French Foley catheter  Indications: 57 year old male with recurrent urinary tract infections, painful bladder, frequency, urgency, urgency incontinence, recurrent ureteral calculi and intermittent hydronephrosis. The patient has been treated with multiple antibiotics, and has been admitted and treated for possible obstructive hydronephrosis secondary to ureteral calculi. He has multiple medical issues. Because of his persistent bladder pain, frequency, urgency, urgency incontinence, and with his history of stones, he undergoes at this time cystoscopy, bilateral retrograde ureteropyelogram to rule out obstructing stones (the patient has a hip prosthesis making CT urograms difficult to interpret in the pelvis), as well as HOD and bladder biopsy to rule out interstitial cystitis versus urothelial abnormality/neoplasm. He understands the procedure as well as risks and complications. He desires to proceed.    Technique and findings: The patient was properly identified in the holding area. He received preoperative IV gentamicin. He was taken the operating room where general anesthetic was administered with the LMA. He was placed in the dorsolithotomy position. Genitalia and perineum were prepped and draped. Timeout was then performed. I passed a 12 cystoscope through the urethra which was free of obstruction  or urothelial abnormality. The bladder was entered. Urine which was purulent was taken and sent for culture. The bladder was then irrigated copiously. Circumferential inspection of the bladder was performed. The ureteral orifices were quite laterally located, and were patulous with a golf-hole orifice. No urothelium maladies were seen within the bladder. Both the 12 and 70 lenses were used for inspection. There was mild erythema posteriorly, most likely from abrasion from the scope. I then performed bilateral retrograde ureteropyelograms with the 6 [place] open-ended catheter which had to be advanced somewhat proximally to capacious ureters all the way to the UVJ.  Both ureters were quite dilated from the UVJ all the way to the renal pelvis, with significant pyelocaliectasis noted bilaterally. I did not see any filling defects within either pyelo-calyceal system. There was some tortuosity of the left ureter. That was not noted on the right ureter. There was no evidence of filling defects within either ureter. With the appearance of this retrograde on each side, I feel the patient most likely has significant reflux from a high-pressure bladder.  At this point, I filled the bladder to capacity for approximately 5 minutes with the irrigant at least 700 mm over the patient's bladder level. The bladder only held approximately 150 cc. Following this, the bladder was inspected. No significant glomerulations were seen. I then took 3 biopsies, one from the right sidewall, one from the left sidewall and one from the superior trigonal region and sent these as random bladder biopsies. The biopsy sites were cauterized with the Bugbee electrode.  This point, with adequate hemostasis, the scope was removed following drainage of the bladder. A 20 French Foley catheter was placed, the balloon filled with 10 cc of water. This was hooked to dependent drainage.  The patient tolerated procedure well. He was awakened and taken to  the PACU in stable condition.  Due to the patient's multiple medical issues, he will be  kept overnight for observation.

## 2012-10-25 ENCOUNTER — Encounter (HOSPITAL_COMMUNITY): Payer: Self-pay | Admitting: Urology

## 2012-10-25 LAB — URINE CULTURE
Colony Count: NO GROWTH
Culture: NO GROWTH

## 2012-10-25 LAB — GLUCOSE, CAPILLARY: Glucose-Capillary: 213 mg/dL — ABNORMAL HIGH (ref 70–99)

## 2012-10-25 MED ORDER — OXYCODONE-ACETAMINOPHEN 5-325 MG PO TABS: 1.0000 | ORAL_TABLET | ORAL | Status: DC | PRN

## 2012-10-25 MED ORDER — CIPROFLOXACIN HCL 500 MG PO TABS: 500.0000 mg | ORAL_TABLET | Freq: Two times a day (BID) | ORAL | Status: DC

## 2012-10-25 NOTE — Progress Notes (Signed)
I went to remove the patient's foley per order and the patient is very anxious and is requesting to leave it.  Will talk to MD when he rounds.  Urine output is dark red, with a few small clots.    Flonnie Hailstone, RN

## 2012-10-25 NOTE — Progress Notes (Signed)
Discharge instructions explained to pt and wife  using teach back, prescriptions given. Stable for discharge.

## 2012-10-25 NOTE — Progress Notes (Signed)
Nutrition Brief Note  Patient identified on the Malnutrition Screening Tool (MST) Report  Wt Readings from Last 15 Encounters:  10/24/12 262 lb 5.6 oz (119 kg)  10/24/12 262 lb 5.6 oz (119 kg)  09/16/12 287 lb (130.182 kg)  09/03/12 287 lb (130.182 kg)  08/28/12 287 lb (130.182 kg)  08/20/12 300 lb 14.4 oz (136.487 kg)  08/04/12 291 lb 0.1 oz (132 kg)  02/02/11 363 lb 8.6 oz (164.9 kg)  02/02/11 363 lb 8.6 oz (164.9 kg)  09/21/09 343 lb 8 oz (155.811 kg)  10/01/07 365 lb (165.563 kg)    Body mass index is 36.61 kg/(m^2). Patient meets criteria for Obesity based on current BMI. Pt has had 13% wt loss in less than 3 months per weight history. Pt states his appetite is good and he was eating well PTA with 3 meals daily. He reports that he lost 40 lbs in 3 weeks when he was hospitalized in February 2014 and he reports steady weight loss since due to being in rehab where the food portions are smaller and he doesn't always like the food. Pt reports eating protein-rich foods daily. Pt has no questions or concerns.   Current diet order is Carb Modified, patient is consuming approximately 100% of meals at this time. Labs and medications reviewed.   No nutrition interventions warranted at this time. If nutrition issues arise, please consult RD.   Pryor Ochoa RD, LDN Inpatient Clinical Dietitian Pager: [phone] After Hours Pager: [phone]

## 2012-10-26 DIAGNOSIS — M1990 Unspecified osteoarthritis, unspecified site: Secondary | ICD-10-CM | POA: Diagnosis not present

## 2012-10-26 DIAGNOSIS — I10 Essential (primary) hypertension: Secondary | ICD-10-CM | POA: Diagnosis not present

## 2012-10-26 DIAGNOSIS — G4733 Obstructive sleep apnea (adult) (pediatric): Secondary | ICD-10-CM | POA: Diagnosis not present

## 2012-10-26 DIAGNOSIS — F411 Generalized anxiety disorder: Secondary | ICD-10-CM | POA: Diagnosis not present

## 2012-10-26 DIAGNOSIS — E119 Type 2 diabetes mellitus without complications: Secondary | ICD-10-CM | POA: Diagnosis not present

## 2012-10-26 DIAGNOSIS — G8929 Other chronic pain: Secondary | ICD-10-CM | POA: Diagnosis not present

## 2012-10-26 DIAGNOSIS — Z466 Encounter for fitting and adjustment of urinary device: Secondary | ICD-10-CM | POA: Diagnosis not present

## 2012-10-26 DIAGNOSIS — Z794 Long term (current) use of insulin: Secondary | ICD-10-CM | POA: Diagnosis not present

## 2012-10-26 DIAGNOSIS — D649 Anemia, unspecified: Secondary | ICD-10-CM | POA: Diagnosis not present

## 2012-10-28 DIAGNOSIS — M1990 Unspecified osteoarthritis, unspecified site: Secondary | ICD-10-CM | POA: Diagnosis not present

## 2012-10-28 DIAGNOSIS — E119 Type 2 diabetes mellitus without complications: Secondary | ICD-10-CM | POA: Diagnosis not present

## 2012-10-28 DIAGNOSIS — G8929 Other chronic pain: Secondary | ICD-10-CM | POA: Diagnosis not present

## 2012-10-28 DIAGNOSIS — Z466 Encounter for fitting and adjustment of urinary device: Secondary | ICD-10-CM | POA: Diagnosis not present

## 2012-10-29 ENCOUNTER — Emergency Department (HOSPITAL_COMMUNITY): Payer: Medicare Other

## 2012-10-29 ENCOUNTER — Inpatient Hospital Stay (HOSPITAL_COMMUNITY): Payer: Medicare Other

## 2012-10-29 ENCOUNTER — Inpatient Hospital Stay (HOSPITAL_COMMUNITY)
Admission: EM | Admit: 2012-10-29 | Discharge: 2012-11-04 | DRG: 871 | Disposition: A | Discharge status: Home Health | Payer: Medicare Other | Attending: Internal Medicine | Admitting: Internal Medicine

## 2012-10-29 ENCOUNTER — Other Ambulatory Visit: Payer: Self-pay

## 2012-10-29 ENCOUNTER — Encounter (HOSPITAL_COMMUNITY): Payer: Self-pay

## 2012-10-29 DIAGNOSIS — G4733 Obstructive sleep apnea (adult) (pediatric): Secondary | ICD-10-CM | POA: Diagnosis present

## 2012-10-29 DIAGNOSIS — E860 Dehydration: Secondary | ICD-10-CM | POA: Diagnosis present

## 2012-10-29 DIAGNOSIS — R310 Gross hematuria: Secondary | ICD-10-CM | POA: Diagnosis not present

## 2012-10-29 DIAGNOSIS — K921 Melena: Secondary | ICD-10-CM

## 2012-10-29 DIAGNOSIS — N390 Urinary tract infection, site not specified: Secondary | ICD-10-CM

## 2012-10-29 DIAGNOSIS — D62 Acute posthemorrhagic anemia: Secondary | ICD-10-CM | POA: Diagnosis not present

## 2012-10-29 DIAGNOSIS — N200 Calculus of kidney: Secondary | ICD-10-CM | POA: Diagnosis not present

## 2012-10-29 DIAGNOSIS — E785 Hyperlipidemia, unspecified: Secondary | ICD-10-CM | POA: Diagnosis present

## 2012-10-29 DIAGNOSIS — G8929 Other chronic pain: Secondary | ICD-10-CM | POA: Diagnosis not present

## 2012-10-29 DIAGNOSIS — J9601 Acute respiratory failure with hypoxia: Secondary | ICD-10-CM

## 2012-10-29 DIAGNOSIS — R319 Hematuria, unspecified: Secondary | ICD-10-CM | POA: Diagnosis not present

## 2012-10-29 DIAGNOSIS — B3741 Candidal cystitis and urethritis: Secondary | ICD-10-CM

## 2012-10-29 DIAGNOSIS — E039 Hypothyroidism, unspecified: Secondary | ICD-10-CM | POA: Diagnosis present

## 2012-10-29 DIAGNOSIS — T82898A Other specified complication of vascular prosthetic devices, implants and grafts, initial encounter: Secondary | ICD-10-CM | POA: Diagnosis not present

## 2012-10-29 DIAGNOSIS — K8020 Calculus of gallbladder without cholecystitis without obstruction: Secondary | ICD-10-CM | POA: Diagnosis not present

## 2012-10-29 DIAGNOSIS — E872 Acidosis, unspecified: Secondary | ICD-10-CM | POA: Diagnosis not present

## 2012-10-29 DIAGNOSIS — I739 Peripheral vascular disease, unspecified: Secondary | ICD-10-CM | POA: Diagnosis present

## 2012-10-29 DIAGNOSIS — S88119A Complete traumatic amputation at level between knee and ankle, unspecified lower leg, initial encounter: Secondary | ICD-10-CM

## 2012-10-29 DIAGNOSIS — N1330 Unspecified hydronephrosis: Secondary | ICD-10-CM | POA: Diagnosis present

## 2012-10-29 DIAGNOSIS — N179 Acute kidney failure, unspecified: Secondary | ICD-10-CM | POA: Diagnosis present

## 2012-10-29 DIAGNOSIS — N12 Tubulo-interstitial nephritis, not specified as acute or chronic: Secondary | ICD-10-CM

## 2012-10-29 DIAGNOSIS — E119 Type 2 diabetes mellitus without complications: Secondary | ICD-10-CM | POA: Diagnosis present

## 2012-10-29 DIAGNOSIS — I82509 Chronic embolism and thrombosis of unspecified deep veins of unspecified lower extremity: Secondary | ICD-10-CM | POA: Diagnosis present

## 2012-10-29 DIAGNOSIS — Z794 Long term (current) use of insulin: Secondary | ICD-10-CM | POA: Diagnosis present

## 2012-10-29 DIAGNOSIS — I129 Hypertensive chronic kidney disease with stage 1 through stage 4 chronic kidney disease, or unspecified chronic kidney disease: Secondary | ICD-10-CM | POA: Diagnosis present

## 2012-10-29 DIAGNOSIS — I10 Essential (primary) hypertension: Secondary | ICD-10-CM

## 2012-10-29 DIAGNOSIS — R32 Unspecified urinary incontinence: Secondary | ICD-10-CM

## 2012-10-29 DIAGNOSIS — A419 Sepsis, unspecified organism: Secondary | ICD-10-CM | POA: Diagnosis not present

## 2012-10-29 DIAGNOSIS — N183 Chronic kidney disease, stage 3 unspecified: Secondary | ICD-10-CM | POA: Diagnosis present

## 2012-10-29 DIAGNOSIS — M1990 Unspecified osteoarthritis, unspecified site: Secondary | ICD-10-CM | POA: Diagnosis not present

## 2012-10-29 DIAGNOSIS — T819XXA Unspecified complication of procedure, initial encounter: Secondary | ICD-10-CM | POA: Diagnosis not present

## 2012-10-29 DIAGNOSIS — R791 Abnormal coagulation profile: Secondary | ICD-10-CM | POA: Diagnosis present

## 2012-10-29 DIAGNOSIS — I959 Hypotension, unspecified: Secondary | ICD-10-CM

## 2012-10-29 DIAGNOSIS — E11649 Type 2 diabetes mellitus with hypoglycemia without coma: Secondary | ICD-10-CM

## 2012-10-29 DIAGNOSIS — T4275XA Adverse effect of unspecified antiepileptic and sedative-hypnotic drugs, initial encounter: Secondary | ICD-10-CM | POA: Diagnosis present

## 2012-10-29 DIAGNOSIS — I2782 Chronic pulmonary embolism: Secondary | ICD-10-CM | POA: Diagnosis present

## 2012-10-29 DIAGNOSIS — N10 Acute tubulo-interstitial nephritis: Secondary | ICD-10-CM | POA: Diagnosis present

## 2012-10-29 DIAGNOSIS — Z7901 Long term (current) use of anticoagulants: Secondary | ICD-10-CM

## 2012-10-29 DIAGNOSIS — I872 Venous insufficiency (chronic) (peripheral): Secondary | ICD-10-CM | POA: Diagnosis present

## 2012-10-29 DIAGNOSIS — E875 Hyperkalemia: Secondary | ICD-10-CM

## 2012-10-29 DIAGNOSIS — N1370 Vesicoureteral-reflux, unspecified: Secondary | ICD-10-CM | POA: Diagnosis present

## 2012-10-29 DIAGNOSIS — D649 Anemia, unspecified: Secondary | ICD-10-CM | POA: Diagnosis not present

## 2012-10-29 DIAGNOSIS — K5909 Other constipation: Secondary | ICD-10-CM | POA: Diagnosis present

## 2012-10-29 DIAGNOSIS — I82409 Acute embolism and thrombosis of unspecified deep veins of unspecified lower extremity: Secondary | ICD-10-CM | POA: Diagnosis present

## 2012-10-29 DIAGNOSIS — N170 Acute kidney failure with tubular necrosis: Secondary | ICD-10-CM | POA: Diagnosis present

## 2012-10-29 DIAGNOSIS — E1121 Type 2 diabetes mellitus with diabetic nephropathy: Secondary | ICD-10-CM | POA: Diagnosis present

## 2012-10-29 DIAGNOSIS — T45515A Adverse effect of anticoagulants, initial encounter: Secondary | ICD-10-CM | POA: Diagnosis present

## 2012-10-29 DIAGNOSIS — Z466 Encounter for fitting and adjustment of urinary device: Secondary | ICD-10-CM | POA: Diagnosis not present

## 2012-10-29 DIAGNOSIS — K5730 Diverticulosis of large intestine without perforation or abscess without bleeding: Secondary | ICD-10-CM

## 2012-10-29 DIAGNOSIS — E669 Obesity, unspecified: Secondary | ICD-10-CM | POA: Diagnosis present

## 2012-10-29 DIAGNOSIS — L0390 Cellulitis, unspecified: Secondary | ICD-10-CM

## 2012-10-29 DIAGNOSIS — M25559 Pain in unspecified hip: Secondary | ICD-10-CM | POA: Diagnosis not present

## 2012-10-29 DIAGNOSIS — Z96649 Presence of unspecified artificial hip joint: Secondary | ICD-10-CM

## 2012-10-29 HISTORY — DX: Tubulo-interstitial nephritis, not specified as acute or chronic: N12

## 2012-10-29 LAB — URINALYSIS, ROUTINE W REFLEX MICROSCOPIC
Ketones, ur: 15 mg/dL — AB
Nitrite: POSITIVE — AB
Protein, ur: >300 mg/dL — AB
Urobilinogen, UA: 1 mg/dL (ref 0.0–1.0)
pH: 5 (ref 5.0–8.0)

## 2012-10-29 LAB — COMPREHENSIVE METABOLIC PANEL
ALT: 10 U/L (ref 0–53)
AST: 10 U/L (ref 0–37)
Albumin: 2.3 g/dL — ABNORMAL LOW (ref 3.5–5.2)
CO2: 18 mEq/L — ABNORMAL LOW (ref 19–32)
Calcium: 8.2 mg/dL — ABNORMAL LOW (ref 8.4–10.5)
Chloride: 102 mEq/L (ref 96–112)
GFR calc non Af Amer: 12 mL/min — ABNORMAL LOW (ref >90)
Sodium: 132 mEq/L — ABNORMAL LOW (ref 135–145)
Total Bilirubin: 0.4 mg/dL (ref 0.3–1.2)

## 2012-10-29 LAB — CBC WITH DIFFERENTIAL/PLATELET
Basophils Absolute: 0 10*3/uL (ref 0.0–0.1)
Basophils Relative: 0 % (ref 0–1)
Lymphocytes Relative: 10 % — ABNORMAL LOW (ref 12–46)
MCHC: 31.1 g/dL (ref 30.0–36.0)
Neutro Abs: 4.3 10*3/uL (ref 1.7–7.7)
Platelets: 187 10*3/uL (ref 150–400)
RDW: 16.7 % — ABNORMAL HIGH (ref 11.5–15.5)
WBC: 5.6 10*3/uL (ref 4.0–10.5)

## 2012-10-29 LAB — PREPARE RBC (CROSSMATCH)

## 2012-10-29 LAB — PROTIME-INR: INR: 3.06 — ABNORMAL HIGH (ref 0.00–1.49)

## 2012-10-29 MED ORDER — DOCUSATE SODIUM 100 MG PO CAPS
100.0000 mg | ORAL_CAPSULE | Freq: Two times a day (BID) | ORAL | Status: DC
  Administered 2012-10-30 – 2012-11-01 (×5): 100 mg via ORAL
  Filled 2012-10-29 (×14): qty 1

## 2012-10-29 MED ORDER — CEFEPIME HCL 2 G IJ SOLR
2.0000 g | Freq: Every day | INTRAMUSCULAR | Status: DC
  Administered 2012-10-29 – 2012-10-31 (×3): 2 g via INTRAVENOUS
  Filled 2012-10-29 (×5): qty 2

## 2012-10-29 MED ORDER — POLYETHYLENE GLYCOL 3350 17 G PO PACK
17.0000 g | PACK | Freq: Every day | ORAL | Status: DC | PRN
  Filled 2012-10-29: qty 1

## 2012-10-29 MED ORDER — ACETAMINOPHEN 325 MG PO TABS: 650.0000 mg | ORAL_TABLET | Freq: Four times a day (QID) | ORAL | Status: DC | PRN

## 2012-10-29 MED ORDER — SODIUM CHLORIDE 0.9 % IV SOLN
1000.0000 mL | INTRAVENOUS | Status: DC
  Administered 2012-10-29: 1000 mL via INTRAVENOUS

## 2012-10-29 MED ORDER — SODIUM CHLORIDE 0.9 % IJ SOLN
3.0000 mL | Freq: Two times a day (BID) | INTRAMUSCULAR | Status: DC
  Administered 2012-10-29 – 2012-11-04 (×5): 3 mL via INTRAVENOUS

## 2012-10-29 MED ORDER — ONDANSETRON HCL 4 MG/2ML IJ SOLN: 4.0000 mg | Freq: Four times a day (QID) | INTRAMUSCULAR | Status: DC | PRN

## 2012-10-29 MED ORDER — INSULIN GLARGINE 100 UNIT/ML ~~LOC~~ SOLN
10.0000 [IU] | Freq: Every day | SUBCUTANEOUS | Status: DC
  Administered 2012-10-29 – 2012-11-03 (×6): 10 [IU] via SUBCUTANEOUS
  Filled 2012-10-29 (×8): qty 0.1

## 2012-10-29 MED ORDER — ONDANSETRON HCL 4 MG PO TABS: 4.0000 mg | ORAL_TABLET | Freq: Four times a day (QID) | ORAL | Status: DC | PRN

## 2012-10-29 MED ORDER — DEXTROSE 5 % IV SOLN
1.0000 g | INTRAVENOUS | Status: DC
  Filled 2012-10-29: qty 10

## 2012-10-29 MED ORDER — SENNA 8.6 MG PO TABS
1.0000 | ORAL_TABLET | Freq: Two times a day (BID) | ORAL | Status: DC
  Administered 2012-10-30 – 2012-11-01 (×6): 8.6 mg via ORAL
  Filled 2012-10-29 (×9): qty 1

## 2012-10-29 MED ORDER — LEVOTHYROXINE SODIUM 200 MCG PO TABS
200.0000 ug | ORAL_TABLET | Freq: Every morning | ORAL | Status: DC
  Administered 2012-10-30 – 2012-11-04 (×6): 200 ug via ORAL
  Filled 2012-10-29 (×7): qty 1

## 2012-10-29 MED ORDER — ADULT MULTIVITAMIN W/MINERALS CH
1.0000 | ORAL_TABLET | Freq: Every day | ORAL | Status: DC
  Administered 2012-10-30 – 2012-11-04 (×6): 1 via ORAL
  Filled 2012-10-29 (×6): qty 1

## 2012-10-29 MED ORDER — SODIUM CHLORIDE 0.9 % IV SOLN
1000.0000 mL | Freq: Once | INTRAVENOUS | Status: AC
  Administered 2012-10-29: 1000 mL via INTRAVENOUS

## 2012-10-29 MED ORDER — SODIUM CHLORIDE 0.9 % IV BOLUS (SEPSIS)
1000.0000 mL | Freq: Once | INTRAVENOUS | Status: AC
  Administered 2012-10-29: 1000 mL via INTRAVENOUS

## 2012-10-29 MED ORDER — ATORVASTATIN CALCIUM 40 MG PO TABS
40.0000 mg | ORAL_TABLET | Freq: Every evening | ORAL | Status: DC
  Administered 2012-10-29 – 2012-11-03 (×6): 40 mg via ORAL
  Filled 2012-10-29 (×9): qty 1

## 2012-10-29 MED ORDER — FERROUS SULFATE 325 (65 FE) MG PO TABS
325.0000 mg | ORAL_TABLET | Freq: Two times a day (BID) | ORAL | Status: DC
  Administered 2012-10-29 – 2012-11-04 (×12): 325 mg via ORAL
  Filled 2012-10-29 (×15): qty 1

## 2012-10-29 MED ORDER — ACETAMINOPHEN 650 MG RE SUPP: 650.0000 mg | Freq: Four times a day (QID) | RECTAL | Status: DC | PRN

## 2012-10-29 MED ORDER — SODIUM CHLORIDE 0.9 % IV BOLUS (SEPSIS): 1000.0000 mL | INTRAVENOUS | Status: DC | PRN

## 2012-10-29 MED ORDER — INSULIN ASPART 100 UNIT/ML ~~LOC~~ SOLN
0.0000 [IU] | Freq: Three times a day (TID) | SUBCUTANEOUS | Status: DC
  Administered 2012-10-30: 1 [IU] via SUBCUTANEOUS
  Administered 2012-10-31 (×2): 2 [IU] via SUBCUTANEOUS
  Administered 2012-10-31: 1 [IU] via SUBCUTANEOUS
  Administered 2012-11-01: 3 [IU] via SUBCUTANEOUS
  Administered 2012-11-01 (×2): 2 [IU] via SUBCUTANEOUS
  Administered 2012-11-02: 1 [IU] via SUBCUTANEOUS
  Administered 2012-11-02 – 2012-11-03 (×5): 2 [IU] via SUBCUTANEOUS
  Administered 2012-11-04: 3 [IU] via SUBCUTANEOUS
  Administered 2012-11-04: 2 [IU] via SUBCUTANEOUS

## 2012-10-29 MED ORDER — BISACODYL 5 MG PO TBEC
5.0000 mg | DELAYED_RELEASE_TABLET | Freq: Every day | ORAL | Status: DC | PRN
  Filled 2012-10-29: qty 1

## 2012-10-29 MED ORDER — INSULIN ASPART 100 UNIT/ML ~~LOC~~ SOLN
0.0000 [IU] | Freq: Every day | SUBCUTANEOUS | Status: DC
  Administered 2012-10-31: 2 [IU] via SUBCUTANEOUS
  Administered 2012-11-02: 3 [IU] via SUBCUTANEOUS
  Administered 2012-11-03: 2 [IU] via SUBCUTANEOUS

## 2012-10-29 MED ORDER — SODIUM CHLORIDE 0.9 % IV SOLN
INTRAVENOUS | Status: DC
  Administered 2012-10-29: 23:00:00 via INTRAVENOUS

## 2012-10-29 MED ORDER — LIDOCAINE HCL 2 % EX GEL: Freq: Three times a day (TID) | CUTANEOUS | Status: DC | PRN

## 2012-10-29 MED ORDER — SODIUM CHLORIDE 0.9 % IV SOLN
1000.0000 mL | INTRAVENOUS | Status: DC
  Administered 2012-10-29 – 2012-11-01 (×6): 1000 mL via INTRAVENOUS

## 2012-10-29 MED ORDER — HYDROCODONE-ACETAMINOPHEN 5-325 MG PO TABS
1.0000 | ORAL_TABLET | Freq: Four times a day (QID) | ORAL | Status: DC | PRN
  Administered 2012-10-29 – 2012-11-01 (×6): 1 via ORAL
  Filled 2012-10-29 (×6): qty 1

## 2012-10-29 NOTE — ED Notes (Signed)
Call the main phlebotomist to let them know RN and ED lab were unsuccessful in drawing labs for pt. Barnetta Chapel, floor nurse aware of labs.

## 2012-10-29 NOTE — ED Notes (Signed)
Per EMS- Patient reports that he has had pain since he had a procedure 5 days ago. Patient states he had a procedure to have his bladder stretched and it was unsuccessful. Patient's family states they only emptied 100cc this AM and it was bloody

## 2012-10-29 NOTE — ED Notes (Signed)
Bed: [facility]A Expected date:  Expected time:  Means of arrival:  Comments: Foley catheter probs

## 2012-10-29 NOTE — ED Provider Notes (Signed)
CSN: [id number]     Arrival date & time 10/29/12  1608 History   First MD Initiated Contact with Patient 10/29/12 1623     Chief Complaint  Patient presents with  . foley cath problems    (Consider location/radiation/quality/duration/timing/severity/associated sxs/prior Treatment) HPI  Patient reports 5 days ago Dr. Eulogio Ditch try to stretch his bladder. He states he only had 4 ounces of volume in his bladder. Looking at the chart Dr. Cipriano Bunker also did 3 biopsies to check for interstitial cystitis. He reports he's had blood from his Foley catheter since he had surgery. EMS was called today for decreasing urinary output. Patient states he only has pain in the suprapubic area which she's had chronically. He states he's had a normal appetite. He denies nausea, vomiting, or chest pain. Although patient has a blood pressure in the 70s he states he feels fine.  PCP [facility]   Past Medical History  Diagnosis Date  . Sleep apnea     uses cpap  . Hypothyroidism   . Anemia   . Blood transfusion   . Chronic kidney disease     hx of kidney stones  . Hypertension   . Arthritis     osteoarthritis  . Pneumonia   . Diabetes mellitus     insulin dependent  . DVT (deep vein thrombosis) in pregnancy   . Peripheral vascular disease   . Hyperlipidemia   . Morbid obesity   . Hypertrophy of prostate   . Acute respiratory failure   . Cellulitis and abscess of leg   . Chronic kidney disease, stage III (moderate)   . Nephrolithiasis   . Anxiety   . Chronic pain    Past Surgical History  Procedure Laterality Date  . Bilteral hip      replacement & revison  's   . Knee surgery      left knee    . Tonsillectomy    . Amputation  02/07/2011    Procedure: AMPUTATION BELOW KNEE;  Surgeon: Newt Minion, MD;  Location: [facility];  Service: Orthopedics;  Laterality: Left;  Left Below Knee Amputation  . Cystoscopy w/ retrogrades Bilateral 10/24/2012    Procedure: CYSTOSCOPY WITH RETROGRADE PYELOGRAM  Procedure: Cystoscopy, Bilateral Retrogarde Pyelograms, Bladder Biopsy, Hydrodistension;  Surgeon: Franchot Gallo, MD;  Location: [facility];  Service: Urology;  Laterality: Bilateral;   Family History  Problem Relation Age of Onset  . Diabetes Mother   . Colon cancer Father   . Dementia Father   . Heart attack Father   . Heart attack Mother   . Stroke Mother    History  Substance Use Topics  . Smoking status: Never Smoker   . Smokeless tobacco: Never Used  . Alcohol Use: No    Review of Systems  All other systems reviewed and are negative.    Allergies  Review of patient's allergies indicates no known allergies.  Home Medications   Current Outpatient Rx  Name  Route  Sig  Dispense  Refill  . atorvastatin (LIPITOR) 40 MG tablet   Oral   Take 40 mg by mouth every evening. Patient takes in the evening         . ciprofloxacin (CIPRO) 500 MG tablet   Oral   Take 1 tablet (500 mg total) by mouth 2 (two) times daily.   10 tablet   1   . cyclobenzaprine (FLEXERIL) 5 MG tablet   Oral   Take 5 mg by mouth at bedtime.         Marland Kitchen  ferrous sulfate 325 (65 FE) MG tablet   Oral   Take 325 mg by mouth 2 (two) times daily.          Marland Kitchen HYDROcodone-acetaminophen (NORCO/VICODIN) 5-325 MG per tablet   Oral   Take 1 tablet by mouth every 6 (six) hours as needed for pain.          Marland Kitchen insulin aspart (NOVOLOG) 100 UNIT/ML injection   Subcutaneous   Inject 5 Units into the skin 3 (three) times daily with meals. And q HS for BS> 150.         Marland Kitchen insulin glargine (LANTUS) 100 UNIT/ML injection   Subcutaneous   Inject 40 Units into the skin at bedtime.          Marland Kitchen levothyroxine (SYNTHROID, LEVOTHROID) 200 MCG tablet   Oral   Take 200 mcg by mouth every morning.          . Multiple Vitamins-Minerals (MULTIVITAMINS THER. W/MINERALS) TABS   Oral   Take 1 tablet by mouth daily.          Marland Kitchen oxyCODONE-acetaminophen (ROXICET) 5-325 MG per tablet   Oral   Take 1 tablet by  mouth every 4 (four) hours as needed for pain.   25 tablet   0   . phenazopyridine (PYRIDIUM) 100 MG tablet   Oral   Take 100 mg by mouth 3 (three) times daily as needed for pain (for candidiasis).         . tamsulosin (FLOMAX) 0.4 MG CAPS capsule   Oral   Take 0.4 mg by mouth at bedtime.          . traMADol (ULTRAM) 50 MG tablet   Oral   Take 50 mg by mouth every 6 (six) hours as needed for pain.          Marland Kitchen lidocaine (XYLOCAINE) 2 % jelly   Topical   Apply topically every 8 (eight) hours as needed (urethral pain).   30 mL   0     ED Triage Vitals  Enc Vitals Group     BP 10/29/12 1616 72/42 mmHg     Pulse Rate 10/29/12 1616 92     Resp 10/29/12 1616 24     Temp 10/29/12 1619 98.1 F (36.7 C)     Temp src 10/29/12 2002 Oral     SpO2 10/29/12 1616 93 %     Weight --      Height --      Head Cir --      Peak Flow --      Pain Score 10/29/12 1641 8     Pain Loc --      Pain Edu? --      Excl. in [place]? --    Vital signs normal except for hypotension   Physical Exam  Constitutional: He is oriented to person, place, and time. He appears well-developed and well-nourished.  Non-toxic appearance. He does not appear ill. No distress.  HENT:  Head: Normocephalic and atraumatic.  Right Ear: External ear normal.  Left Ear: External ear normal.  Nose: Nose normal. No mucosal edema or rhinorrhea.  Mouth/Throat: Mucous membranes are normal. No dental abscesses or edematous.  Tongue dry  Eyes: Conjunctivae and EOM are normal. Pupils are equal, round, and reactive to light.  Neck: Normal range of motion and full passive range of motion without pain. Neck supple.  Cardiovascular: Normal rate, regular rhythm and normal heart sounds.  Exam reveals no gallop and  no friction rub.   No murmur heard. Pulmonary/Chest: Effort normal and breath sounds normal. No respiratory distress. He has no wheezes. He has no rhonchi. He has no rales. He exhibits no tenderness and no crepitus.    Abdominal: Soft. Normal appearance and bowel sounds are normal. He exhibits no distension. There is tenderness in the suprapubic area. There is no rebound and no guarding.    Musculoskeletal: Normal range of motion. He exhibits no edema and no tenderness.  Status post left BKA Left leg has diffuse redness and mild swelling.  Neurological: He is alert and oriented to person, place, and time. He has normal strength. No cranial nerve deficit.  Skin: Skin is warm, dry and intact. No rash noted. No erythema. No pallor.  Psychiatric: He has a normal mood and affect. His speech is normal and behavior is normal. His mood appears not anxious.    ED Course  Procedures (including critical care time)  Medications  0.9 %  sodium chloride infusion (0 mLs Intravenous Stopped 10/29/12 1738)    Followed by  0.9 %  sodium chloride infusion (0 mLs Intravenous Stopped 10/29/12 1657)    Followed by  0.9 %  sodium chloride infusion (1,000 mLs Intravenous Restarted 10/29/12 2115)  cefTRIAXone (ROCEPHIN) 1 g in dextrose 5 % 50 mL IVPB (not administered)  sodium chloride 0.9 % bolus 1,000 mL (0 mLs Intravenous Stopped 10/29/12 1923)   Bladder scan done on arrival shows only 57 cc of urine. His Foley catheter is noted to have small amount of bright red blood. The catheter was irrigated easily by nursing staff. She reports then it was having some creamy bloody drainage.  Patient was rechecked several times. Every time I rechecked him he denied feeling bad. He just consistently complained of suprapubic pain which is a chronic complaint.  CT scan of abdomen and pelvis was ordered to rule out possible bladder rupture or complication from his recent manipulation.  Patient was given IV fluids for his hypotension. Patient's blood pressure did improve to 92/54 after 3 L. Patient was prepared to be transfused for 2 units of blood.  Patient was discussed with Dr. Jeffie Pollock, urologist at [phone]. He suggested addition to the CT scan  of his abdomen to do a CT cystogram.  2000 Dr. Jeffie Pollock called back. He has reviewed his CT scan and CT cystogram. He feels he does not have any complications from his recent surgery. He feels that he can be admitted to medicine and he will let Dr. Eulogio Ditch know he is in the hospital.  2019 Dr. Melvyn Novas with critical care states hospitals can admit  2055 Dr. Sheran Fava states patient can be admitted to step down to team 8, she asked to make sure urology put a note on the chart tomorrow  2103 Dr. Jeffie Pollock states Dr. Eulogio Ditch will see patient tomorrow   Labs Review  Results for orders placed during the hospital encounter of 10/29/12  CBC WITH DIFFERENTIAL      Result Value Range   WBC 5.6  4.0 - 10.5 K/uL   RBC 2.89 (*) 4.22 - 5.81 MIL/uL   Hemoglobin 7.6 (*) 13.0 - 17.0 g/dL   HCT 24.4 (*) 39.0 - 52.0 %   MCV 84.4  78.0 - 100.0 fL   MCH 26.3  26.0 - 34.0 pg   MCHC 31.1  30.0 - 36.0 g/dL   RDW 16.7 (*) 11.5 - 15.5 %   Platelets 187  150 - 400 K/uL   Neutrophils Relative %  77  43 - 77 %   Neutro Abs 4.3  1.7 - 7.7 K/uL   Lymphocytes Relative 10 (*) 12 - 46 %   Lymphs Abs 0.6 (*) 0.7 - 4.0 K/uL   Monocytes Relative 11  3 - 12 %   Monocytes Absolute 0.6  0.1 - 1.0 K/uL   Eosinophils Relative 2  0 - 5 %   Eosinophils Absolute 0.1  0.0 - 0.7 K/uL   Basophils Relative 0  0 - 1 %   Basophils Absolute 0.0  0.0 - 0.1 K/uL  COMPREHENSIVE METABOLIC PANEL      Result Value Range   Sodium 132 (*) 135 - 145 mEq/L   Potassium 4.5  3.5 - 5.1 mEq/L   Chloride 102  96 - 112 mEq/L   CO2 18 (*) 19 - 32 mEq/L   Glucose, Bld 185 (*) 70 - 99 mg/dL   BUN 71 (*) 6 - 23 mg/dL   Creatinine, Ser 5.09 (*) 0.50 - 1.35 mg/dL   Calcium 8.2 (*) 8.4 - 10.5 mg/dL   Total Protein 6.0  6.0 - 8.3 g/dL   Albumin 2.3 (*) 3.5 - 5.2 g/dL   AST 10  0 - 37 U/L   ALT 10  0 - 53 U/L   Alkaline Phosphatase 102  39 - 117 U/L   Total Bilirubin 0.4  0.3 - 1.2 mg/dL   GFR calc non Af Amer 12 (*) >90 mL/min   GFR calc Af Amer 14  (*) >90 mL/min  URINALYSIS, ROUTINE W REFLEX MICROSCOPIC      Result Value Range   Color, Urine RED (*) YELLOW   APPearance TURBID (*) CLEAR   Specific Gravity, Urine 1.031 (*) 1.005 - 1.030   pH 5.0  5.0 - 8.0   Glucose, UA NEGATIVE  NEGATIVE mg/dL   Hgb urine dipstick LARGE (*) NEGATIVE   Bilirubin Urine LARGE (*) NEGATIVE   Ketones, ur 15 (*) NEGATIVE mg/dL   Protein, ur >300 (*) NEGATIVE mg/dL   Urobilinogen, UA 1.0  0.0 - 1.0 mg/dL   Nitrite POSITIVE (*) NEGATIVE   Leukocytes, UA LARGE (*) NEGATIVE  APTT      Result Value Range   aPTT 59 (*) 24 - 37 seconds  PROTIME-INR      Result Value Range   Prothrombin Time 30.5 (*) 11.6 - 15.2 seconds   INR 3.06 (*) 0.00 - 1.49  URINE MICROSCOPIC-ADD ON      Result Value Range   Urine-Other URINALYSIS PERFORMED ON SUPERNATANT    CG4 I-STAT (LACTIC ACID)      Result Value Range   Lactic Acid, Venous 1.66  0.5 - 2.2 mmol/L  TYPE AND SCREEN      Result Value Range   ABO/RH(D) O POS     Antibody Screen NEG     Sample Expiration 11/01/2012     Unit Number [id number]     Blood Component Type RED CELLS,LR     Unit division 00     Status of Unit ALLOCATED     Transfusion Status OK TO TRANSFUSE     Crossmatch Result Compatible     Unit Number [id number]     Blood Component Type RED CELLS,LR     Unit division 00     Status of Unit ALLOCATED     Transfusion Status OK TO TRANSFUSE     Crossmatch Result Compatible    PREPARE RBC (CROSSMATCH)      Result Value Range  Order Confirmation ORDER PROCESSED BY BLOOD BANK     Laboratory interpretation all normal except  Prolonged INR, worsening of his chronic anemia, acute worsening of his renal insufficiency, mild metabolic acidosis     Imaging Review Ct Abdomen Pelvis Wo Contrast  10/29/2012   *RADIOLOGY REPORT*  Clinical Data: Abdominal pain.  CT ABDOMEN AND PELVIS WITHOUT CONTRAST  Technique:  Multidetector CT imaging of the abdomen and pelvis was performed following the  standard protocol without intravenous contrast.  Comparison: CT of the abdomen and pelvis 10/10/2012.  Findings:  Lung Bases: Atherosclerotic calcifications of the left main, left anterior descending, left circumflex and right coronary arteries.  Abdomen/Pelvis:  Calcified gallstones layering dependently in the gallbladder.  No current findings to suggest acute cholecystitis at this time.  The visualized portions of the unenhanced liver, pancreas, spleen and bilateral adrenal glands are unremarkable.  As with the prior examination there are numerous nonobstructive calculi within the collecting systems of the kidneys bilaterally. The largest of these measures approximately 9 mm in the lower pole collecting system of the right kidney.  There are no calcified ureteral stones or bladder stones.  However, there continues to be diffuse perinephric and severe bilateral periureteric stranding (right greater than left).  As well, there appears to be some mild right-sided hydroureteronephrosis and mild left-sided hydroureter. The overall appearance is very similar to the prior examination from 10/10/2012.  The urinary bladder is nearly completely decompressed with a Foley balloon catheter in place.  A small amount of gas in the nondependent portion of the urinary bladder is presumably iatrogenic.  Numerous colonic diverticula are noted, without surrounding inflammatory changes to suggest acute diverticulitis at this time. No significant volume of ascites.  No pneumoperitoneum.  No pathologic distension of small bowel.  Normal appendix.  No definite pathologic lymphadenopathy identified within the abdomen on today's noncontrast CT examination, although there are numerous reactive size lymph nodes throughout the retroperitoneum. Additionally, there are enlarged lymph nodes along the pelvic side wall bilaterally, largest of which is in the left external iliac nodal stations measuring approximately 4.1 x 3.5 cm (image 90 of  series 2); these are similar to numerous prior examinations, and have actually decreased compared to remote prior study from 07/04/2010, and are presumably benign.  A small left inguinal hernia containing only fat incidentally noted.  Musculoskeletal: Status post bilateral total hip arthroplasty. Small locules of gas are noted in the subcutaneous fat of the anterior abdominal wall, presumably iatrogenic.  There are no aggressive appearing lytic or blastic lesions noted in the visualized portions of the skeleton.  IMPRESSION: 1.  Highly unusual appearance of the ureters and collecting systems of the kidneys bilaterally, similar to the recent prior examination, as above.  Although there are numerous nonobstructive calculi within the collecting systems of the kidneys bilaterally, there are no ureteral stones identified at this time.  Overall, the appearance is favored to represent distal ureteral strictures bilaterally (right greater than left) and/or recurrent upper tract urinary tract infection.  Clinical correlation is recommended. 2.  Cholelithiasis without evidence to suggest acute cholecystitis at this time. 3.  Normal appendix. 4.  Colonic diverticulosis without findings to suggest acute diverticulitis at this time. 5.  Atherosclerosis, including left main and three vessel coronary artery disease.  Please note that although the presence of coronary artery calcium documents the presence of coronary artery disease, the severity of this disease and any potential stenosis cannot be assessed on this non-gated CT examination.  Assessment for potential  risk factor modification, dietary therapy or pharmacologic therapy may be warranted, if clinically indicated. 6.  Additional incidental findings, as above.   Original Report Authenticated By: Vinnie Langton, M.D.   Ct Pelvis Wo Contrast  10/29/2012   *RADIOLOGY REPORT*  Clinical Data: Pelvic pain.  Recent history of bladder stretching. Evaluate for potential bladder  perforation.  CT PELVIS WITHOUT CONTRAST  Technique:  Multidetector CT imaging of the pelvis was performed following the standard protocol without intravenous contrast.  Comparison: CT of the abdomen and pelvis without contrast 10/29/2012.  Findings: The bladder is opacified with contrast, with exception of the most nondependent portion which contains a small amount of gas. There is reflux of contrast material into the distal ureters bilaterally (left greater than right). Marked thickening of the urothelium in the ureters bilaterally and extensive periureteric and perinephric stranding bilaterally again noted.  No extravasation of contrast material outside the confines of the urinary bladder are noted. Status post bilateral total hip arthroplasty.  No other significant change compared to tonight's earlier examination.  IMPRESSION: 1.  No findings to suggest bladder rupture.  No significant change from earlier examination obtained tonight.   Original Report Authenticated By: Vinnie Langton, M.D.    Date: 10/29/2012  Rate: 88  Rhythm: normal sinus rhythm  QRS Axis: normal  Intervals: normal  ST/T Wave abnormalities: nonspecific T wave changes  Conduction Disutrbances:none  Narrative Interpretation:   Old EKG Reviewed: unchanged from 08/14/2012    MDM   1. Acute renal failure (ARF)   2. Anemia   3. Warfarin-induced coagulopathy, initial encounter   4. Hematuria   5. UTI (urinary tract infection)   6. Hypotension    Plan admission  Rolland Porter, MD, FACEP   CRITICAL CARE Performed by: Rolland Porter L Total critical care time: 50 min Critical care time was exclusive of separately billable procedures and treating other patients. Critical care was necessary to treat or prevent imminent or life-threatening deterioration. Critical care was time spent personally by me on the following activities: development of treatment plan with patient and/or surrogate as well as nursing, discussions with  consultants, evaluation of patient's response to treatment, examination of patient, obtaining history from patient or surrogate, ordering and performing treatments and interventions, ordering and review of laboratory studies, ordering and review of radiographic studies, pulse oximetry and re-evaluation of patient's condition.     Janice Norrie, MD 10/29/12 2131

## 2012-10-29 NOTE — H&P (Signed)
[facility] History and Physical  [name] [id number] DOB: 10/04/1955 DOA: 10/29/2012  Referring physician:  Rolland Porter PCP:  No primary provider on file.   Chief Complaint:  hematuria  HPI:  The patient is a 57 y.o. 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  He underwent bladder biopsy and stretching on 8/28 by [name].  Post-operatively, he had some hematuria which looked like gross blood for the last several days and which prompted his home health RN to have him brought to the hospital today.  Currently, the patient tells me he feels fine.  He denies lightheadedness, chest pain, SOB, nausea, vomiting, poor appetite, dehydration, diarrhea.  He had a firm BM overnight.  He has some suprapubic pain which has been present since his procedure.  Denies fevers and chills.  In contrast, when I spoke with his wife on the phone, she states that he has had some fatigue and lightheadedness, particularly today.  He told her he was feeling feverish earlier today and that he had coughed up two small blood clots.    In the ER, he was hypotensive to the 60s/40s and which has come up to the 80s/50s after 3 liters of saline.  PCCM was called, but they declined admission per ER MD.  Labs were notable for Na 132, bicarb 18, BUN 71, Cr 5.09 (baseline 2), albumin 2.3, WBC 5.6, hgb 7.6 (baseline 10mg /dl), plt 187.  UA with SG 1.031, lg bili, 15 ketones, nitrite positive, large LE and large hg.  INR 3.06.  CT abd/pelvis without contrast demonstrated numerous nonobstructive calculi within the collecting systems of the kidneys bilaterally without ureteral stones and suggestion of distal ureteral strictures right greater than left and/or possible recurrent upper UTI.  Incidentally  noticed cholelithiasis without cholecystitis, diverticulosis, and atherosclerosis.  Follow up cystogram demonstrated contrast throughout collecting system bilaterally.  Review of Systems:   General:  Possible fever earlier today.  + weight loss HEENT:  Denies changes to hearing and vision, rhinorrhea, sinus congestion, sore throat CV:  Denies chest pain and palpitations.  Chronic lower extremity edema.  PULM:  Denies SOB, wheezing GI:  Denies nausea, vomiting, diarrhea.   GU:  Denies dysuria, frequency, urgency ENDO:  Denies polyuria, polydipsia.   HEME:  Denies hematemesis, blood in stools, melena, abnormal bruising.  Hematuria per HPI.   LYMPH:  Denies lymphadenopathy.   MSK:  Denies arthralgias, myalgias.   DERM:  Chronic skin rash LLE, denies ulcer.   NEURO:  Denies focal numbness, weakness, slurred speech, confusion, facial droop.  PSYCH:  Denies anxiety and depression.    Past Medical History  Diagnosis Date  . Sleep apnea     uses cpap  . Hypothyroidism   . Anemia   . Blood transfusion   . Chronic kidney disease     hx of kidney stones  . Hypertension   . Arthritis     osteoarthritis  . Pneumonia   . Diabetes mellitus     insulin dependent  . DVT (deep vein thrombosis) in pregnancy   . Peripheral vascular disease   . Hyperlipidemia   . Morbid obesity   . Hypertrophy of prostate   . Acute respiratory failure   . Cellulitis and abscess of leg   . Chronic kidney disease, stage  III (moderate)   . Nephrolithiasis   . Anxiety   . Chronic pain    Past Surgical History  Procedure Laterality Date  . Bilteral hip      replacement & revison  's   . Knee surgery      left knee    . Tonsillectomy    . Amputation  02/07/2011    Procedure: AMPUTATION BELOW KNEE;  Surgeon: Newt Minion, MD;  Location: [facility];  Service: Orthopedics;  Laterality: Left;  Left Below Knee Amputation  . Cystoscopy w/ retrogrades Bilateral 10/24/2012    Procedure: CYSTOSCOPY WITH RETROGRADE  PYELOGRAM Procedure: Cystoscopy, Bilateral Retrogarde Pyelograms, Bladder Biopsy, Hydrodistension;  Surgeon: Franchot Gallo, MD;  Location: [facility];  Service: Urology;  Laterality: Bilateral;   Social History:  reports that he has never smoked. He has never used smokeless tobacco. He reports that he does not drink alcohol or use illicit drugs. Lives with his wife and uses a wheelchair for transfers.    No Known Allergies  Family History  Problem Relation Age of Onset  . Diabetes Mother   . Colon cancer Father   . Dementia Father   . Heart attack Father   . Heart attack Mother   . Stroke Mother      Prior to Admission medications   Medication Sig Start Date End Date Taking? Authorizing Provider  atorvastatin (LIPITOR) 40 MG tablet Take 40 mg by mouth every evening. Patient takes in the evening   Yes Historical Provider, MD  ciprofloxacin (CIPRO) 500 MG tablet Take 1 tablet (500 mg total) by mouth 2 (two) times daily. 10/25/12  Yes Franchot Gallo, MD  cyclobenzaprine (FLEXERIL) 5 MG tablet Take 5 mg by mouth at bedtime.   Yes Historical Provider, MD  ferrous sulfate 325 (65 FE) MG tablet Take 325 mg by mouth 2 (two) times daily.    Yes Historical Provider, MD  HYDROcodone-acetaminophen (NORCO/VICODIN) 5-325 MG per tablet Take 1 tablet by mouth every 6 (six) hours as needed for pain.  10/25/12  Yes Historical Provider, MD  insulin aspart (NOVOLOG) 100 UNIT/ML injection Inject 5 Units into the skin 3 (three) times daily with meals. And q HS for BS> 150.   Yes Historical Provider, MD  insulin glargine (LANTUS) 100 UNIT/ML injection Inject 40 Units into the skin at bedtime.    Yes Historical Provider, MD  levothyroxine (SYNTHROID, LEVOTHROID) 200 MCG tablet Take 200 mcg by mouth every morning.    Yes Historical Provider, MD  Multiple Vitamins-Minerals (MULTIVITAMINS THER. W/MINERALS) TABS Take 1 tablet by mouth daily.    Yes Historical Provider, MD  oxyCODONE-acetaminophen (ROXICET) 5-325 MG  per tablet Take 1 tablet by mouth every 4 (four) hours as needed for pain. 10/25/12  Yes Franchot Gallo, MD  phenazopyridine (PYRIDIUM) 100 MG tablet Take 100 mg by mouth 3 (three) times daily as needed for pain (for candidiasis).   Yes Historical Provider, MD  tamsulosin (FLOMAX) 0.4 MG CAPS capsule Take 0.4 mg by mouth at bedtime.    Yes Historical Provider, MD  traMADol (ULTRAM) 50 MG tablet Take 50 mg by mouth every 6 (six) hours as needed for pain.    Yes Historical Provider, MD  lidocaine (XYLOCAINE) 2 % jelly Apply topically every 8 (eight) hours as needed (urethral pain). 08/21/12   Geradine Girt, DO   Physical Exam: Filed Vitals:   10/29/12 1915 10/29/12 2002 10/29/12 2100 10/29/12 2130  BP: 92/54 92/57 82/51  82/48  Pulse:  97  88  Temp:  97.6 F (36.4 C)    TempSrc:  Oral    Resp: 18 18 18 17   SpO2:  95%  97%     General:  Obese CM, no acute distress, lying on stretcher  Eyes:  PERRL, anicteric, non-injected.  ENT:  Nares clear.  OP clear, non-erythematous without plaques or exudates.  Dry MM.  Neck:  Supple without TM or JVD.    Lymph:  No cervical, supraclavicular, or submandibular LAD.  Cardiovascular:  RRR, normal S1, S2, without m/r/g.  2+ pulses, warm extremities  Respiratory:  CTA bilaterally without increased WOB.  Abdomen:  NABS.  Soft, ND.  TTP in the suprapubic area without rebounding or guarding  Skin:  Hyperpigmentation and telangiectasia of the left lower extremity without erythema, induration, or warmth  Musculoskeletal:  Normal bulk and tone.  Left BKA.  Bilateral 1+ pitting edema.   Psychiatric:  A & O x 4.  Appropriate affect.  Neurologic:  CN 3-12 intact.  5/5 strength BLE and RLE.  Sensation intact.  Labs on Admission:  Basic Metabolic Panel:  Recent Labs Lab 10/24/12 1805 10/29/12 1642  NA 136 132*  K 3.7 4.5  CL 106 102  CO2 23 18*  GLUCOSE 142* 185*  BUN 24* 71*  CREATININE 1.90* 5.09*  CALCIUM 8.6 8.2*   Liver Function  Tests:  Recent Labs Lab 10/29/12 1642  AST 10  ALT 10  ALKPHOS 102  BILITOT 0.4  PROT 6.0  ALBUMIN 2.3*   No results found for this basename: LIPASE, AMYLASE,  in the last 168 hours No results found for this basename: AMMONIA,  in the last 168 hours CBC:  Recent Labs Lab 10/29/12 1642  WBC 5.6  NEUTROABS 4.3  HGB 7.6*  HCT 24.4*  MCV 84.4  PLT 187   Cardiac Enzymes: No results found for this basename: CKTOTAL, CKMB, CKMBINDEX, TROPONINI,  in the last 168 hours  BNP (last 3 results) No results found for this basename: PROBNP,  in the last 8760 hours CBG:  Recent Labs Lab 10/24/12 1215 10/24/12 1723 10/24/12 2140 10/25/12 0744 10/25/12 1110  GLUCAP 120* 110* 203* 162* 213*    Radiological Exams on Admission: Ct Abdomen Pelvis Wo Contrast  10/29/2012   *RADIOLOGY REPORT*  Clinical Data: Abdominal pain.  CT ABDOMEN AND PELVIS WITHOUT CONTRAST  Technique:  Multidetector CT imaging of the abdomen and pelvis was performed following the standard protocol without intravenous contrast.  Comparison: CT of the abdomen and pelvis 10/10/2012.  Findings:  Lung Bases: Atherosclerotic calcifications of the left main, left anterior descending, left circumflex and right coronary arteries.  Abdomen/Pelvis:  Calcified gallstones layering dependently in the gallbladder.  No current findings to suggest acute cholecystitis at this time.  The visualized portions of the unenhanced liver, pancreas, spleen and bilateral adrenal glands are unremarkable.  As with the prior examination there are numerous nonobstructive calculi within the collecting systems of the kidneys bilaterally. The largest of these measures approximately 9 mm in the lower pole collecting system of the right kidney.  There are no calcified ureteral stones or bladder stones.  However, there continues to be diffuse perinephric and severe bilateral periureteric stranding (right greater than left).  As well, there appears to be some  mild right-sided hydroureteronephrosis and mild left-sided hydroureter. The overall appearance is very similar to the prior examination from 10/10/2012.  The urinary bladder is nearly completely decompressed with a Foley balloon catheter in place.  A small amount of gas in  the nondependent portion of the urinary bladder is presumably iatrogenic.  Numerous colonic diverticula are noted, without surrounding inflammatory changes to suggest acute diverticulitis at this time. No significant volume of ascites.  No pneumoperitoneum.  No pathologic distension of small bowel.  Normal appendix.  No definite pathologic lymphadenopathy identified within the abdomen on today's noncontrast CT examination, although there are numerous reactive size lymph nodes throughout the retroperitoneum. Additionally, there are enlarged lymph nodes along the pelvic side wall bilaterally, largest of which is in the left external iliac nodal stations measuring approximately 4.1 x 3.5 cm (image 90 of series 2); these are similar to numerous prior examinations, and have actually decreased compared to remote prior study from 07/04/2010, and are presumably benign.  A small left inguinal hernia containing only fat incidentally noted.  Musculoskeletal: Status post bilateral total hip arthroplasty. Small locules of gas are noted in the subcutaneous fat of the anterior abdominal wall, presumably iatrogenic.  There are no aggressive appearing lytic or blastic lesions noted in the visualized portions of the skeleton.  IMPRESSION: 1.  Highly unusual appearance of the ureters and collecting systems of the kidneys bilaterally, similar to the recent prior examination, as above.  Although there are numerous nonobstructive calculi within the collecting systems of the kidneys bilaterally, there are no ureteral stones identified at this time.  Overall, the appearance is favored to represent distal ureteral strictures bilaterally (right greater than left) and/or  recurrent upper tract urinary tract infection.  Clinical correlation is recommended. 2.  Cholelithiasis without evidence to suggest acute cholecystitis at this time. 3.  Normal appendix. 4.  Colonic diverticulosis without findings to suggest acute diverticulitis at this time. 5.  Atherosclerosis, including left main and three vessel coronary artery disease.  Please note that although the presence of coronary artery calcium documents the presence of coronary artery disease, the severity of this disease and any potential stenosis cannot be assessed on this non-gated CT examination.  Assessment for potential risk factor modification, dietary therapy or pharmacologic therapy may be warranted, if clinically indicated. 6.  Additional incidental findings, as above.   Original Report Authenticated By: Vinnie Langton, M.D.   Ct Pelvis Wo Contrast  10/29/2012   *RADIOLOGY REPORT*  Clinical Data: Pelvic pain.  Recent history of bladder stretching. Evaluate for potential bladder perforation.  CT PELVIS WITHOUT CONTRAST  Technique:  Multidetector CT imaging of the pelvis was performed following the standard protocol without intravenous contrast.  Comparison: CT of the abdomen and pelvis without contrast 10/29/2012.  Findings: The bladder is opacified with contrast, with exception of the most nondependent portion which contains a small amount of gas. There is reflux of contrast material into the distal ureters bilaterally (left greater than right). Marked thickening of the urothelium in the ureters bilaterally and extensive periureteric and perinephric stranding bilaterally again noted.  No extravasation of contrast material outside the confines of the urinary bladder are noted. Status post bilateral total hip arthroplasty.  No other significant change compared to tonight's earlier examination.  IMPRESSION: 1.  No findings to suggest bladder rupture.  No significant change from earlier examination obtained tonight.   Original  Report Authenticated By: Vinnie Langton, M.D.    EKG: Independently reviewed. NSR.  T-wave inversion in III and aVF which are new compared to prior.    Assessment/Plan Principal Problem:   Septic shock Active Problems:   DIABETES MELLITUS-TYPE II   HYPERLIPIDEMIA   OBESITY   Acute posthemorrhagic anemia   DVT   Metabolic acidosis  AKI (acute kidney injury)   Pyelonephritis   UI (urinary incontinence)  Hypotension, DDX includes infection such as pyelonephritis, dehydration, hemorrhagic shock, adrenal insufficiency, or cardiac etiology. -  Stepdown -  Continue IVF -  Start abx for possible UTI -  CXR (patient declined) -  Cycle troponins -  Cortisol level -  Minimize medications which could lower BP -  Consider empiric steroids if not improving with addition of antibiotics  Hematuria, may be due to recent bladder dilation or kidney stones.  -  Monitor for now -  Start saline flushes if blood clots starting to form  Pyelonephritis, possible.  Patient has + LE on UA, cloudy urine, recent instrumentation, persistent hypotension despite IVF and no history to support dehydration.  He also, however, does not have fever or leukocytosis.  -  F/u urine culture -  Cefepime (as was on cipro as outpatient and should broaden coverage in this setting)  Acute on chronic kidney injury likely due to hypotension, possible dehydration.  Baseline creatinine is 2.   -  FENa -  Strict I/O and daily weights -  Renally dose medications -  Minimize nephrotoxins and avoid ACEI -  Hydration  Acute anemia, possibly due to hematuria, although history of GIB also.  - Transfuse 2 unit PRBC due to ongoing bleeding and hypoTN despite hgb being greater than 7. -  Occult stool -  Hold warfarin and consider reversal with FFP  Chronic A/C due to DVT with hematuria -  Hold A/C.    T2DM with A1c of 4.9 < 3 months ago -  Decrease to lantus 10 units with low dose SSI  Metabolic acidosis, likely related  to AKI.  -  NS during period of hypoTN -  Please call nephrology in AM if not improving with hydration  Hematemesis, resolved -  Patient declines CXR  Constipation, likely narcotic induced. -  Start colace, senna, miralax, and bisacodyl.  Hypothyroid, stable.  Continue synthroid  PVD with chronic venous stasis and dermatitis -  Compression stockings, elevate legs  Diet:  renal Access:  PIV IVF:  NS Proph:  Therapeutic coumadin at present  Code Status: full  Family Communication: spoke with patient and then his wife by phone Disposition Plan: Admit to stepdown  Time spent: 60 min Janece Canterbury [facility] Pager [phone]  If 7PM-7AM, please contact night-coverage [email or web address] Password [facility] 10/29/2012, 10:03 PM

## 2012-10-29 NOTE — Progress Notes (Signed)
Spoke with pt in regards to nighttime CPAP ordered, pt currently refuses at this time due to discomfort. Verbalized understanding to call if at any point this decision changes. RN aware.

## 2012-10-29 NOTE — ED Notes (Signed)
Foley irrigated with 43ml NS with return of milky red urine totally 70 ml.

## 2012-10-29 NOTE — Progress Notes (Signed)
ANTIBIOTIC CONSULT NOTE - INITIAL  Pharmacy Consult for cefepime Indication: UTI  No Known Allergies  Patient Measurements:   Adjusted Body Weight:   Vital Signs: Temp: 97.6 F (36.4 C) (09/02 2002) Temp src: Oral (09/02 2002) BP: 82/48 mmHg (09/02 2130) Pulse Rate: 88 (09/02 2130) Intake/Output from previous day:   Intake/Output from this shift:    Labs:  Recent Labs  10/29/12 1642  WBC 5.6  HGB 7.6*  PLT 187  CREATININE 5.09*   The CrCl is unknown because both a height and weight (above a minimum accepted value) are required for this calculation. No results found for this basename: Letta Median, VANCORANDOM, GENTTROUGH, GENTPEAK, GENTRANDOM, TOBRATROUGH, TOBRAPEAK, TOBRARND, AMIKACINPEAK, AMIKACINTROU, AMIKACIN,  in the last 72 hours   Microbiology: Recent Results (from the past 720 hour(s))  URINE CULTURE     Status: None   Collection Time    10/10/12  2:38 PM      Result Value Range Status   Specimen Description URINE, CLEAN CATCH   Final   Special Requests NONE   Final   Culture  Setup Time     Final   Value: 10/10/2012 23:30     Performed at [facility] Count     Final   Value: NO GROWTH     Performed at [facility]   Culture     Final   Value: NO GROWTH     Performed at [facility]   Report Status 10/11/2012 FINAL   Final  SURGICAL PCR SCREEN     Status: None   Collection Time    10/24/12  9:15 AM      Result Value Range Status   MRSA, PCR NEGATIVE  NEGATIVE Final   Staphylococcus aureus NEGATIVE  NEGATIVE Final   Comment:            The Xpert SA Assay (FDA     approved for NASAL specimens     in patients over 55 years of age),     is one component of     a comprehensive surveillance     program.  Test performance has     been validated by [employer] for patients greater     than or equal to 56 year old.     It is not intended     to diagnose infection nor to     guide or monitor  treatment.  URINE CULTURE     Status: None   Collection Time    10/24/12 11:40 AM      Result Value Range Status   Specimen Description URINE, CLEAN CATCH   Final   Special Requests PATIENT ON FOLLOWING GENTAMICIN 650MG    Final   Culture  Setup Time     Final   Value: 10/24/2012 15:08     Performed at [facility]     Final   Value: NO GROWTH     Performed at [facility]   Culture     Final   Value: NO GROWTH     Performed at [facility]   Report Status 10/25/2012 FINAL   Final    Medical History: Past Medical History  Diagnosis Date  . Sleep apnea     uses cpap  . Hypothyroidism   . Anemia   . Blood transfusion   . Chronic kidney disease     hx of kidney  stones  . Hypertension   . Arthritis     osteoarthritis  . Pneumonia   . Diabetes mellitus     insulin dependent  . DVT (deep vein thrombosis) in pregnancy   . Peripheral vascular disease   . Hyperlipidemia   . Morbid obesity   . Hypertrophy of prostate   . Acute respiratory failure   . Cellulitis and abscess of leg   . Chronic kidney disease, stage III (moderate)   . Nephrolithiasis   . Anxiety   . Chronic pain    Assessment: 57 YOM admitted with foley catheter problems, physician's physical exam also notes LLE erythema and mild swelling (he is s/p L BKA 01/2011), noted to have ARF on CRI.   9/2 >> cefepime >> / >>  >>    Tmax: WBCs: 5.6 Renal: SCr = 5.09  9/2 blood: pending 8/28 urine: No growth / sputum:    Goal of Therapy:  Appropriately dose for renal function  Plan:   Cefepime 2gm IV q24h (higher dose for obesity), adjust frequency if renal function improves  Doreene Eland, PharmD, BCPS.   Pager: [phone]  10/29/2012,9:35 PM

## 2012-10-29 NOTE — ED Notes (Signed)
Bladder Scan-57cc

## 2012-10-30 DIAGNOSIS — N179 Acute kidney failure, unspecified: Secondary | ICD-10-CM | POA: Diagnosis present

## 2012-10-30 DIAGNOSIS — A419 Sepsis, unspecified organism: Secondary | ICD-10-CM | POA: Diagnosis not present

## 2012-10-30 DIAGNOSIS — N12 Tubulo-interstitial nephritis, not specified as acute or chronic: Secondary | ICD-10-CM | POA: Diagnosis not present

## 2012-10-30 DIAGNOSIS — I959 Hypotension, unspecified: Secondary | ICD-10-CM | POA: Diagnosis not present

## 2012-10-30 DIAGNOSIS — N189 Chronic kidney disease, unspecified: Secondary | ICD-10-CM

## 2012-10-30 LAB — LIPID PANEL
HDL: 12 mg/dL — ABNORMAL LOW (ref >39)
LDL Cholesterol: 43 mg/dL (ref 0–99)
Total CHOL/HDL Ratio: 8.3 RATIO
Triglycerides: 226 mg/dL — ABNORMAL HIGH (ref <150)
VLDL: 45 mg/dL — ABNORMAL HIGH (ref 0–40)

## 2012-10-30 LAB — BASIC METABOLIC PANEL
BUN: 65 mg/dL — ABNORMAL HIGH (ref 6–23)
CO2: 17 mEq/L — ABNORMAL LOW (ref 19–32)
Calcium: 7.5 mg/dL — ABNORMAL LOW (ref 8.4–10.5)
Chloride: 108 mEq/L (ref 96–112)
Creatinine, Ser: 4.25 mg/dL — ABNORMAL HIGH (ref 0.50–1.35)

## 2012-10-30 LAB — CBC
HCT: 27.7 % — ABNORMAL LOW (ref 39.0–52.0)
MCH: 26.5 pg (ref 26.0–34.0)
MCHC: 31.8 g/dL (ref 30.0–36.0)
MCV: 83.4 fL (ref 78.0–100.0)
Platelets: 153 10*3/uL (ref 150–400)
RDW: 17.7 % — ABNORMAL HIGH (ref 11.5–15.5)

## 2012-10-30 LAB — SODIUM, URINE, RANDOM: Sodium, Ur: 89 mEq/L

## 2012-10-30 LAB — GLUCOSE, CAPILLARY
Glucose-Capillary: 120 mg/dL — ABNORMAL HIGH (ref 70–99)
Glucose-Capillary: 121 mg/dL — ABNORMAL HIGH (ref 70–99)
Glucose-Capillary: 92 mg/dL (ref 70–99)

## 2012-10-30 LAB — CREATININE, URINE, RANDOM: Creatinine, Urine: 58.1 mg/dL

## 2012-10-30 LAB — MRSA PCR SCREENING: MRSA by PCR: NEGATIVE

## 2012-10-30 MED ORDER — SODIUM CHLORIDE 0.9 % IV BOLUS (SEPSIS)
1000.0000 mL | Freq: Once | INTRAVENOUS | Status: AC
  Administered 2012-10-30: 1000 mL via INTRAVENOUS

## 2012-10-30 NOTE — Progress Notes (Addendum)
[facility] PROGRESS NOTE  [name] [id number] DOB: [birth year] DOA: 10/29/2012 PCP: No primary provider on file.  HPI/Brief narrative 57 y.o. 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 He underwent bladder biopsy and stretching on 8/28 by [name]. Post-operatively, he had some hematuria which looked like gross blood for the last several days and which prompted his home health RN to have him brought to the hospital. He also complained of fatigue, lightheadedness and feeling feverish. He specifically denied coughing blood at any time, or dyspnea or chest pain. In the ED, patient was hypotensive in the 60s/40s, Labs were notable for Na 132, bicarb 18, BUN 71, Cr 5.09 (baseline 2), albumin 2.3, WBC 5.6, hgb 7.6 (baseline 10mg /dl), plt 187, UA +, INR 3.06 & CT abd/pelvis without contrast demonstrated numerous nonobstructive calculi within the collecting systems of the kidneys bilaterally without ureteral stones and suggestion of distal ureteral strictures right greater than left and/or possible recurrent upper UTI. Critical care was consulted by EDP and recommended hospitalist admit. Admitted to step down unit and aggressively hydrated, transfuse 2 units of PRBCs and treated with broad-spectrum IV antibiotics. Urology consulted the   Assessment/Plan:  Sepsis, present on admission/acute pyelonephritis - Possibly secondary to acute pyelonephritis. - Has received almost 5 L of IVF since admission. Blood pressures soft-states that usually systolic blood pressures run in the 100s. - Continue IV cefepime pending final culture results. - blood culture x2 pending.  Hypotension -DD: Sepsis, dehydration, anemia. - Improved. Continue antibiotics and IVF.  - Random  cortisol 8.6. - Monitor BPs closely with appropriate sized cuff and if still not getting appropriate readings may consider arterial line. - Asymptomatic.  Acute on stage III chronic kidney disease - Baseline creatinine: 2 - DD: Dehydration, hypotension/ATN/ hydroureters - FeNA: 5.9 - Improving. Continue IV fluids and Foley catheter. - Discussed with urology: Ideally needs a ideal conduit but is a high risk surgical candidate.? Suprapubic catheter.  Acute on chronic anemia - DD: Hematuria, critical illness - Status post 2 units PRBCs. Improved. Monitor CBCs closely. - Coumadin currently on hold.  DVT on chronic Coumadin - Coumadin currently on hold. Consider resuming in a.m.  Non-anion gap metabolic acidosis - Possibly secondary to a AKI. Follow BMP.  Type II DM - Hemoglobin A1c 4.9 less than 3 months ago suggesting very tight control. - Lantus reduced to 10 units daily and continue SSI. Monitor closely.  Constipation  -Likely narcotic induced. Continue bowel regimen.   Hypothyroid  -Continue Synthroid.      PVD/chronic RLE venous stasis & dermatitis/left BKA  - Left BKA stump is healed.   Hematuria/bilateral hydroureter/recent urology procedure - Gross hematuria has resolved. Continue Foley catheter. -  await urology input.   ? Hemoptysis - Apparently reported on admission but patient denies ever having.  DVT prophylaxis:  therapeutically anticoagulated.  Lines/catheters:  PIV/Foley Activity: Up with assistance. PT and OT ordered.  Nutrition:  Diabetic diet   Code Status:  full Family Communication:  none  Disposition Plan: Continue management in step down unit for additional 24 hours and then reassess. Home when medically stable.   Consultants:  Urology-pending   Procedures:   Foley   Antibiotics:   IV cefepime 9/2 >   Subjective:  thirsty this morning and asking for something  to eat and drink. Denies pain, cough, dyspnea, dizziness, lightheadedness,  chest pain.   Objective: Filed Vitals:   10/30/12 0653 10/30/12 0733 10/30/12 0800 10/30/12 0830  BP: 84/40   99/42  Pulse:    90  Temp:   98.1 F (36.7 C)   TempSrc:   Oral   Resp:    22  Height:      Weight:  131.5 kg (289 lb 14.5 oz)    SpO2:    97%    Intake/Output Summary (Last 24 hours) at 10/30/12 1044 Last data filed at 10/30/12 0700  Gross per 24 hour  Intake 4084.67 ml  Output   1375 ml  Net 2709.67 ml   Filed Weights   10/29/12 2229 10/30/12 0733  Weight: 127.5 kg (281 lb 1.4 oz) 131.5 kg (289 lb 14.5 oz)     Exam:  General exam:  moderately built and morbidly obese male lying comfortably supine in bed.  Respiratory system: Clear. No increased work of breathing. Cardiovascular system: S1 & S2 heard, RRR. No JVD, murmurs, gallops, clicks. Trace chronic appearing 1+ right leg edema with chronic skin changes/hyperpigmentation. Tele: SR. Gastrointestinal system: Abdomen is nondistended, soft and nontender. Normal bowel sounds heard. Foley catheter +  Central nervous system: Alert and oriented. No focal neurological deficits. Extremities: Symmetric 5 x 5 power. Left BKA stump with compression stocking.   Data Reviewed: Basic Metabolic Panel:  Recent Labs Lab 10/24/12 1805 10/29/12 1642 10/30/12 0520  NA 136 132* 134*  K 3.7 4.5 4.1  CL 106 102 108  CO2 23 18* 17*  GLUCOSE 142* 185* 135*  BUN 24* 71* 65*  CREATININE 1.90* 5.09* 4.25*  CALCIUM 8.6 8.2* 7.5*   Liver Function Tests:  Recent Labs Lab 10/29/12 1642  AST 10  ALT 10  ALKPHOS 102  BILITOT 0.4  PROT 6.0  ALBUMIN 2.3*   No results found for this basename: LIPASE, AMYLASE,  in the last 168 hours No results found for this basename: AMMONIA,  in the last 168 hours CBC:  Recent Labs Lab 10/29/12 1642 10/30/12 0520  WBC 5.6 4.8  NEUTROABS 4.3  --   HGB 7.6* 8.8*  HCT 24.4* 27.7*  MCV 84.4 83.4  PLT 187 153   Cardiac Enzymes:  Recent Labs Lab 10/29/12 2311 10/30/12 0520   TROPONINI <0.30 <0.30   BNP (last 3 results) No results found for this basename: PROBNP,  in the last 8760 hours CBG:  Recent Labs Lab 10/24/12 2140 10/25/12 0744 10/25/12 1110 10/29/12 2310 10/30/12 0808  GLUCAP 203* 162* 213* 120* 118*    Recent Results (from the past 240 hour(s))  SURGICAL PCR SCREEN     Status: None   Collection Time    10/24/12  9:15 AM      Result Value Range Status   MRSA, PCR NEGATIVE  NEGATIVE Final   Staphylococcus aureus NEGATIVE  NEGATIVE Final   Comment:            The Xpert SA Assay (FDA     approved for NASAL specimens     in patients over 39 years of age),     is one component of     a comprehensive surveillance     program.  Test performance has     been validated by [employer] for patients greater     than or equal to 43 year old.     It is not intended  to diagnose infection nor to     guide or monitor treatment.  URINE CULTURE     Status: None   Collection Time    10/24/12 11:40 AM      Result Value Range Status   Specimen Description URINE, CLEAN CATCH   Final   Special Requests PATIENT ON FOLLOWING GENTAMICIN 650MG    Final   Culture  Setup Time     Final   Value: 10/24/2012 15:08     Performed at [place]     Final   Value: NO GROWTH     Performed at [facility]   Culture     Final   Value: NO GROWTH     Performed at [facility]   Report Status 10/25/2012 FINAL   Final  MRSA PCR SCREENING     Status: None   Collection Time    10/29/12 10:25 PM      Result Value Range Status   MRSA by PCR NEGATIVE  NEGATIVE Final   Comment:            The GeneXpert MRSA Assay (FDA     approved for NASAL specimens     only), is one component of a     comprehensive MRSA colonization     surveillance program. It is not     intended to diagnose MRSA     infection nor to guide or     monitor treatment for     MRSA infections.      Additional labs: 1. Lipid panel: Cholesterol  100, TG 226, HDL 12, LDL 43 and VLDL 45. 2. Venous lactate on admission: 1.66 3. INR 9/2:3.06 4. Random cortisol: 8.6      Studies: Ct Abdomen Pelvis Wo Contrast  10/29/2012   *RADIOLOGY REPORT*  Clinical Data: Abdominal pain.  CT ABDOMEN AND PELVIS WITHOUT CONTRAST  Technique:  Multidetector CT imaging of the abdomen and pelvis was performed following the standard protocol without intravenous contrast.  Comparison: CT of the abdomen and pelvis 10/10/2012.  Findings:  Lung Bases: Atherosclerotic calcifications of the left main, left anterior descending, left circumflex and right coronary arteries.  Abdomen/Pelvis:  Calcified gallstones layering dependently in the gallbladder.  No current findings to suggest acute cholecystitis at this time.  The visualized portions of the unenhanced liver, pancreas, spleen and bilateral adrenal glands are unremarkable.  As with the prior examination there are numerous nonobstructive calculi within the collecting systems of the kidneys bilaterally. The largest of these measures approximately 9 mm in the lower pole collecting system of the right kidney.  There are no calcified ureteral stones or bladder stones.  However, there continues to be diffuse perinephric and severe bilateral periureteric stranding (right greater than left).  As well, there appears to be some mild right-sided hydroureteronephrosis and mild left-sided hydroureter. The overall appearance is very similar to the prior examination from 10/10/2012.  The urinary bladder is nearly completely decompressed with a Foley balloon catheter in place.  A small amount of gas in the nondependent portion of the urinary bladder is presumably iatrogenic.  Numerous colonic diverticula are noted, without surrounding inflammatory changes to suggest acute diverticulitis at this time. No significant volume of ascites.  No pneumoperitoneum.  No pathologic distension of small bowel.  Normal appendix.  No definite pathologic  lymphadenopathy identified within the abdomen on today's noncontrast CT examination, although there are numerous reactive size lymph nodes throughout the retroperitoneum. Additionally, there are  enlarged lymph nodes along the pelvic side wall bilaterally, largest of which is in the left external iliac nodal stations measuring approximately 4.1 x 3.5 cm (image 90 of series 2); these are similar to numerous prior examinations, and have actually decreased compared to remote prior study from 07/04/2010, and are presumably benign.  A small left inguinal hernia containing only fat incidentally noted.  Musculoskeletal: Status post bilateral total hip arthroplasty. Small locules of gas are noted in the subcutaneous fat of the anterior abdominal wall, presumably iatrogenic.  There are no aggressive appearing lytic or blastic lesions noted in the visualized portions of the skeleton.  IMPRESSION: 1.  Highly unusual appearance of the ureters and collecting systems of the kidneys bilaterally, similar to the recent prior examination, as above.  Although there are numerous nonobstructive calculi within the collecting systems of the kidneys bilaterally, there are no ureteral stones identified at this time.  Overall, the appearance is favored to represent distal ureteral strictures bilaterally (right greater than left) and/or recurrent upper tract urinary tract infection.  Clinical correlation is recommended. 2.  Cholelithiasis without evidence to suggest acute cholecystitis at this time. 3.  Normal appendix. 4.  Colonic diverticulosis without findings to suggest acute diverticulitis at this time. 5.  Atherosclerosis, including left main and three vessel coronary artery disease.  Please note that although the presence of coronary artery calcium documents the presence of coronary artery disease, the severity of this disease and any potential stenosis cannot be assessed on this non-gated CT examination.  Assessment for potential risk  factor modification, dietary therapy or pharmacologic therapy may be warranted, if clinically indicated. 6.  Additional incidental findings, as above.   Original Report Authenticated By: Vinnie Langton, M.D.   Ct Pelvis Wo Contrast  10/29/2012   *RADIOLOGY REPORT*  Clinical Data: Pelvic pain.  Recent history of bladder stretching. Evaluate for potential bladder perforation.  CT PELVIS WITHOUT CONTRAST  Technique:  Multidetector CT imaging of the pelvis was performed following the standard protocol without intravenous contrast.  Comparison: CT of the abdomen and pelvis without contrast 10/29/2012.  Findings: The bladder is opacified with contrast, with exception of the most nondependent portion which contains a small amount of gas. There is reflux of contrast material into the distal ureters bilaterally (left greater than right). Marked thickening of the urothelium in the ureters bilaterally and extensive periureteric and perinephric stranding bilaterally again noted.  No extravasation of contrast material outside the confines of the urinary bladder are noted. Status post bilateral total hip arthroplasty.  No other significant change compared to tonight's earlier examination.  IMPRESSION: 1.  No findings to suggest bladder rupture.  No significant change from earlier examination obtained tonight.   Original Report Authenticated By: Vinnie Langton, M.D.        Scheduled Meds: . atorvastatin  40 mg Oral QPM  . ceFEPime (MAXIPIME) IV  2 g Intravenous QHS  . docusate sodium  100 mg Oral BID  . ferrous sulfate  325 mg Oral BID  . insulin aspart  0-5 Units Subcutaneous QHS  . insulin aspart  0-9 Units Subcutaneous TID WC  . insulin glargine  10 Units Subcutaneous QHS  . levothyroxine  200 mcg Oral q morning - 10a  . multivitamin with minerals  1 tablet Oral Daily  . senna  1 tablet Oral BID  . sodium chloride  3 mL Intravenous Q12H   Continuous Infusions: . sodium chloride 1,000 mL (10/30/12 0421)     Principal Problem:  Septic shock Active Problems:   DIABETES MELLITUS-TYPE II   HYPERLIPIDEMIA   OBESITY   Acute posthemorrhagic anemia   DVT   Metabolic acidosis   AKI (acute kidney injury)   Pyelonephritis   UI (urinary incontinence)    Time spent: 50 minutes     [facility] Pager [phone].   If 8PM-8AM, please contact night-coverage at [email or web address], password [facility] 10/30/2012, 10:44 AM  LOS: 1 day

## 2012-10-31 DIAGNOSIS — A419 Sepsis, unspecified organism: Secondary | ICD-10-CM | POA: Diagnosis not present

## 2012-10-31 DIAGNOSIS — N390 Urinary tract infection, site not specified: Secondary | ICD-10-CM | POA: Diagnosis not present

## 2012-10-31 DIAGNOSIS — N179 Acute kidney failure, unspecified: Secondary | ICD-10-CM | POA: Diagnosis not present

## 2012-10-31 DIAGNOSIS — N12 Tubulo-interstitial nephritis, not specified as acute or chronic: Secondary | ICD-10-CM | POA: Diagnosis not present

## 2012-10-31 DIAGNOSIS — R310 Gross hematuria: Secondary | ICD-10-CM | POA: Diagnosis not present

## 2012-10-31 DIAGNOSIS — I959 Hypotension, unspecified: Secondary | ICD-10-CM | POA: Diagnosis not present

## 2012-10-31 LAB — URINE CULTURE: Colony Count: NO GROWTH

## 2012-10-31 LAB — TYPE AND SCREEN
Antibody Screen: NEGATIVE
Unit division: 0
Unit division: 0

## 2012-10-31 LAB — BASIC METABOLIC PANEL
BUN: 61 mg/dL — ABNORMAL HIGH (ref 6–23)
Chloride: 109 mEq/L (ref 96–112)
GFR calc Af Amer: 20 mL/min — ABNORMAL LOW (ref >90)
Potassium: 4.2 mEq/L (ref 3.5–5.1)

## 2012-10-31 LAB — GLUCOSE, CAPILLARY
Glucose-Capillary: 142 mg/dL — ABNORMAL HIGH (ref 70–99)
Glucose-Capillary: 173 mg/dL — ABNORMAL HIGH (ref 70–99)
Glucose-Capillary: 168 mg/dL — ABNORMAL HIGH (ref 70–99)

## 2012-10-31 LAB — CBC
HCT: 28.5 % — ABNORMAL LOW (ref 39.0–52.0)
Hemoglobin: 8.8 g/dL — ABNORMAL LOW (ref 13.0–17.0)
WBC: 4.3 10*3/uL (ref 4.0–10.5)

## 2012-10-31 LAB — PROTIME-INR: INR: 3.04 — ABNORMAL HIGH (ref 0.00–1.49)

## 2012-10-31 MED ORDER — PHENAZOPYRIDINE HCL 100 MG PO TABS
100.0000 mg | ORAL_TABLET | Freq: Three times a day (TID) | ORAL | Status: DC | PRN
  Administered 2012-10-31 – 2012-11-04 (×8): 100 mg via ORAL
  Filled 2012-10-31 (×7): qty 1

## 2012-10-31 NOTE — Progress Notes (Signed)
Wife brought Patients home CPAP in and Pt. did not want to wear.  Pt currently asleep and tolerating well.  RT to monitor and assess as needed.

## 2012-10-31 NOTE — Progress Notes (Signed)
[facility]  Patient Status: Active (receiving services up to time of hospitalization)  [facility] is providing the following services: RN, PT and OT  If patient discharges after hours, please call [phone].   [name] 10/31/2012, 11:06 AM

## 2012-10-31 NOTE — Progress Notes (Signed)
Called to patient's bedside to assist with cpap.  Per notes, patient's wife brought in cpap, tubing, and mask yesterday, but he refused to wear it overnight.  Upon removing cpap from bag for setup, tubing noted to be damaged and is not usable.  Pt stated that it wasn't like that when he brought it from home.  RN present at bedside.  Pt was supplied hospital-provided tubing at this time.  Patient's cpap tubing from home was placed back in the belongings bag as found.   No other obvious defects with machine/frays on cord noted. Pt requested that I setup his machine with 2l o2 bleedin as per home regimen, and that he will place it on himself later when ready.  Machine was turned on and appears to be working at this time.  This RT will place call for [employer] to inspect machine.  Pt was advised that RT is available all night, and encouraged him to call should he need further assistance.

## 2012-10-31 NOTE — Progress Notes (Signed)
Attempted to call report to [facility].  Per secretary, pt room not ready.  Receiving RN requested to call back when room is ready.  Doreatha Martin, RN

## 2012-10-31 NOTE — Evaluation (Signed)
Occupational Therapy Evaluation Patient Details Name: [name] MRN: [id number] DOB: 11-21-1955 Today's Date: 10/31/2012 Time: [id number] OT Time Calculation (min): 22 min  OT Assessment / Plan / Recommendation History of present illness The patient is a 57 y.o. 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  He underwent bladder biopsy and stretching on 8/28 by [name].  Post-operatively, he had some hematuria which looked like gross blood for the last several days and which prompted his home health RN to have him brought to the hospital today.  Currently, the patient tells me he feels fine.  He denies lightheadedness, chest pain, SOB, nausea, vomiting, poor appetite, dehydration, diarrhea.  He had a firm BM overnight.  He has some suprapubic pain which has been present since his procedure.  Denies fevers and chills.  In contrast, when I spoke with his wife on the phone, she states that he has had some fatigue and lightheadedness, particularly today.  He told her he was feeling feverish earlier today and that he had coughed up two small blood clots.     Clinical Impression   Pt is currently +2 assist for EOB sitting only today. He states his wife works so 24/7 not available. Will benefit from skilled OT services to maximize ADL independence for next venue of care.     OT Assessment  Patient needs continued OT Services    Follow Up Recommendations  SNF;Supervision/Assistance - 24 hour (unless pt able to progress and family available/able to assist at d/c)    Barriers to Discharge      Equipment Recommendations  None recommended by OT    Recommendations for Other Services    Frequency  Min 2X/week    Precautions / Restrictions Precautions Precautions:  Fall Precaution Comments: L BKA Restrictions Weight Bearing Restrictions: No   Pertinent Vitals/Pain Indicating catheter pain, not rated. Reposition in supine.    ADL  Eating/Feeding: Simulated;Independent Where Assessed - Eating/Feeding: Bed level Grooming: Simulated;Wash/dry hands;Set up Where Assessed - Grooming: Supine, head of bed up Upper Body Bathing: Simulated;Chest;Right arm;Left arm;Abdomen;Min guard Where Assessed - Upper Body Bathing: Unsupported sitting Lower Body Bathing: Simulated;Maximal assistance Where Assessed - Lower Body Bathing: Unsupported sitting;Rolling right and/or left Upper Body Dressing: Simulated;Minimal assistance Where Assessed - Upper Body Dressing: Unsupported sitting Lower Body Dressing: Simulated;+1 Total assistance (with R sock) Where Assessed - Lower Body Dressing: Supine, head of bed up ADL Comments: Pt repeatedly stating he "doesnt feel ready" to do therapy but with max encouragement, finally agreed to work on sitting EOB this visit. Pt complaining of significant catheter pain and with any movement of catheter tube/bag pt groaning with pain. Pt able to self assist to pull self up in bed with also therapist assist.     OT Diagnosis: Generalized weakness;Acute pain  OT Problem List: Decreased strength;Decreased knowledge of use of DME or AE;Pain OT Treatment Interventions: Self-care/ADL training;DME and/or AE instruction;Therapeutic activities;Patient/family education   OT Goals(Current goals can be found in the care plan section) Acute Rehab OT Goals Patient Stated Goal: none stated OT Goal Formulation: With patient Time For Goal Achievement: 11/14/12 Potential to Achieve Goals: Good  Visit Information  Last OT Received On: 10/31/12 Assistance Needed: +2 PT/OT Co-Evaluation/Treatment: Yes History of Present Illness: The patient is a 56 y.o. year-old  male with history of T2DM, DVT many years ago on chronic anticoagulation, PVD s/p left BKA  and chronic venous stasis on the right, CKD stage III with episode of ARF 07/2012 requiring transient CRRT, nephrolithiasis, small bladder, recurrent UTI, followed by Urology.  He underwent bladder biopsy and stretching on 8/28 by [name].  Post-operatively, he had some hematuria which looked like gross blood for the last several days and which prompted his home health RN to have him brought to the hospital today.  Currently, the patient tells me he feels fine.  He denies lightheadedness, chest pain, SOB, nausea, vomiting, poor appetite, dehydration, diarrhea.  He had a firm BM overnight.  He has some suprapubic pain which has been present since his procedure.  Denies fevers and chills.  In contrast, when I spoke with his wife on the phone, she states that he has had some fatigue and lightheadedness, particularly today.  He told her he was feeling feverish earlier today and that he had coughed up two small blood clots.         Prior [name] expects to be discharged to:: Skilled nursing facility (wife works) Prior Function Level of Independence: Needs assistance ADL's / Homemaking Assistance Needed: Pt states he could transfer himself via a slideboard to the wheelchair or droparm commode after he left SNF (approximately 1 week ago) but needed assist with dressing, bathing Comments: lives in first floor apartment, owns a wheelchair, droparm commode and rolling walker Communication Communication: No difficulties         Vision/Perception     Cognition  Cognition Arousal/Alertness: Awake/alert Behavior During Therapy: WFL for tasks assessed/performed Overall Cognitive Status: Within Functional Limits for tasks assessed    Extremity/Trunk Assessment Upper Extremity Assessment Upper Extremity Assessment: Overall WFL for tasks assessed     Mobility Bed Mobility Bed Mobility: Supine to Sit;Sit to Supine Supine to Sit: 1: +2 Total assist;With  rails Supine to Sit: Patient Percentage: 50% Sit to Supine: 1: +2 Total assist;With rail Sit to Supine: Patient Percentage: 50% Details for Bed Mobility Assistance: use of pad to scoot hips around to EOB. Needs assist for trunk to upright and some assist for LEs off the EOB. Assist for helping trunk down to bed to return supine as well as LEs onto bed. Transfers Details for Transfer Assistance: EOB only     Exercise     Balance Balance Balance Assessed: Yes Static Sitting Balance Static Sitting - Level of Assistance: 5: Stand by assistance;Other (comment) (initially with R UE support on bed ) Static Sitting - Comment/# of Minutes: pt sat EOB for approximately 5 minutes   End of Session OT - End of Session Activity Tolerance: Patient limited by pain Patient left: in bed;with call bell/phone within reach  GO     Jules Schick [id number] 10/31/2012, 12:42 PM

## 2012-10-31 NOTE — Evaluation (Signed)
Physical Therapy Evaluation Patient Details Name: [name] MRN: [id number] DOB: 20-Dec-1955 Today's Date: 10/31/2012 Time: [id number] PT Time Calculation (min): 22 min  PT Assessment / Plan / Recommendation History of Present Illness  The patient is a 57 y.o. 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  He underwent bladder biopsy and stretching on 8/28 by [name].  Post-operatively, he had some hematuria which looked like gross blood for the last several days and which prompted his home health RN to have him brought to the hospital today.  Currently, the patient tells me he feels fine.  He denies lightheadedness, chest pain, SOB, nausea, vomiting, poor appetite, dehydration, diarrhea.  He had a firm BM overnight.  He has some suprapubic pain which has been present since his procedure.  Denies fevers and chills.  In contrast, when I spoke with his wife on the phone, she states that he has had some fatigue and lightheadedness, particularly today.  He told her he was feeling feverish earlier today and that he had coughed up two small blood clots.    Clinical Impression  On eval, pt required +2 assist for bed mobility-sat EOB ~5 mintues. Pt declined to practice transfer to wheelchair. Max encouragement just to get pt to sit EOB. Recent stay at SNF so pt may refuse placement. Pt is home alone mostly so he will need to be able to mobilize at supervision level.     PT Assessment  Patient needs continued PT services    Follow Up Recommendations  SNF (unless pt progresses and wife can provide care)    Does the patient have the potential to tolerate intense rehabilitation      Barriers to Discharge        Equipment Recommendations  None recommended by PT     Recommendations for Other Services OT consult   Frequency Min 3X/week    Precautions / Restrictions Precautions Precautions: Fall Precaution Comments: L BKA Restrictions Weight Bearing Restrictions: No   Pertinent Vitals/Pain Catheter-unrated-"very painful, stupid catheter"      Mobility  Bed Mobility Bed Mobility: Supine to Sit;Sit to Supine Supine to Sit: 1: +2 Total assist Supine to Sit: Patient Percentage: 50% Sit to Supine: 1: +2 Total assist Sit to Supine: Patient Percentage: 50% Details for Bed Mobility Assistance: use of pad to scoot hips around to EOB. Needs assist for trunk to upright and some assist for LEs off the EOB. Assist for helping trunk down to bed to return supine as well as LEs onto bed. Increased time. Pt declined to practice lateral scoot to [facility] Transfers Details for Transfer Assistance: EOB only    Exercises     PT Diagnosis: Generalized weakness;Acute pain  PT Problem List: Decreased strength;Decreased activity tolerance;Decreased mobility;Pain;Decreased knowledge of use of DME PT Treatment Interventions: Gait training;DME instruction;Functional mobility training;Therapeutic activities;Therapeutic exercise;Patient/family education     PT Goals(Current goals can be found in the care plan section) Acute Rehab PT Goals Patient Stated Goal: home PT Goal Formulation: With patient Time For Goal Achievement: 11/14/12 Potential to Achieve Goals: Fair  Visit Information  Last PT Received On: 10/31/12 Assistance Needed: +2 History of Present Illness: The patient is a 57 y.o. 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  He underwent bladder biopsy and stretching on 8/28 by [name].  Post-operatively, he had some hematuria  which looked like gross blood for the last several days and which prompted his home health RN to have him brought to the hospital today.  Currently, the patient tells me he feels fine.  He denies lightheadedness, chest pain, SOB, nausea, vomiting, poor appetite, dehydration, diarrhea.  He had a firm BM overnight.  He has some suprapubic pain which has been present since his procedure.  Denies fevers and chills.  In contrast, when I spoke with his wife on the phone, she states that he has had some fatigue and lightheadedness, particularly today.  He told her he was feeling feverish earlier today and that he had coughed up two small blood clots.         Prior [place] expects to be discharged to:: Skilled nursing facility Prior Function Level of Independence: Needs assistance ADL's / Homemaking Assistance Needed: Pt states he could transfer himself via a slideboard to the wheelchair or droparm commode after he left SNF (approximately 1 week ago) but needed assist with dressing, bathing Comments: lives in first floor apartment, owns a wheelchair, droparm commode and rolling walker Communication Communication: No difficulties    Cognition  Cognition Arousal/Alertness: Awake/alert Behavior During Therapy: WFL for tasks assessed/performed Overall Cognitive Status: Within Functional Limits for tasks assessed    Extremity/Trunk Assessment Upper Extremity Assessment Upper Extremity Assessment: Defer to OT evaluation Lower Extremity Assessment Lower Extremity Assessment: RLE deficits/detail;LLE deficits/detail RLE Deficits / Details: Unable to fully assess due to limited participation from pt. Hip flex 3/5 LLE Deficits / Details: L BKA Cervical / Trunk Assessment Cervical / Trunk Assessment: Kyphotic   Balance Balance Balance Assessed: Yes Static Sitting Balance Static Sitting - Level of Assistance: 5: Stand by assistance Static Sitting - Comment/# of Minutes: pt  sat EOB for approximately 5 minutes  End of Session PT - End of Session Activity Tolerance: Patient limited by fatigue;Patient limited by pain Patient left: in bed;with call bell/phone within reach  GP     Weston Anna, MPT Pager: [phone]

## 2012-10-31 NOTE — Progress Notes (Signed)
[facility] PROGRESS NOTE  [name] [id number] DOB: [birth year] DOA: 10/29/2012 PCP: No primary provider on file.  HPI/Brief narrative 57 y.o. 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 He underwent bladder biopsy and stretching on 8/28 by [name]. Post-operatively, he had some hematuria which looked like gross blood for the last several days and which prompted his home health RN to have him brought to the hospital. He also complained of fatigue, lightheadedness and feeling feverish. He specifically denied coughing blood at any time, or dyspnea or chest pain. In the ED, patient was hypotensive in the 60s/40s, Labs were notable for Na 132, bicarb 18, BUN 71, Cr 5.09 (baseline 2), albumin 2.3, WBC 5.6, hgb 7.6 (baseline 10mg /dl), plt 187, UA +, INR 3.06 & CT abd/pelvis without contrast demonstrated numerous nonobstructive calculi within the collecting systems of the kidneys bilaterally without ureteral stones and suggestion of distal ureteral strictures right greater than left and/or possible recurrent upper UTI. Critical care was consulted by EDP and recommended hospitalist admit. Admitted to step down unit and aggressively hydrated, transfuse 2 units of PRBCs and treated with broad-spectrum IV antibiotics. Urology consulted the   Assessment/Plan:  Sepsis, present on admission/acute pyelonephritis - Possibly secondary to acute pyelonephritis. - Continue IV cefepime pending final culture results. - Blood culture x2 negative to date. Urine culture x1-sent on 9/3, after patient had been on IV cefepime-results pending. - Was aggressively hydrated since admission. Blood pressures have stabilized.  Hypotension -DD: Sepsis, dehydration, anemia. - Improved.  Continue antibiotics and IVF.  - Random cortisol 8.6. - Blood pressures have stabilized. No pressors required. Continue IV fluids for acute renal failure management.  Acute on stage III chronic kidney disease - Baseline creatinine: 2 - DD: Dehydration, hypotension/ATN/ hydroureters - FeNA: 5.9 - Improving. Continue IV fluids and Foley catheter. 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 ideal conduit-referral to tertiary care center will be arranged by urology as outpatient..  Acute on chronic anemia - DD: Hematuria, critical illness - Status post 2 units PRBCs. Improved & stable - Coumadin currently on hold. - Continue ferrous sulfate  DVT/PE on chronic Coumadin - Coumadin currently on hold. INR 3.04 - Mild tinge of hematuria in Foley bag. Consider resuming Coumadin in a.m.  Non-anion gap metabolic acidosis - Possibly secondary to a AKI. Improving.  Type II DM - Hemoglobin A1c 4.9 less than 3 months ago suggesting very tight control. - Lantus reduced to 10 units daily and continue SSI. Monitor closely. - Good inpatient control.  Constipation  -Likely narcotic induced. Continue bowel regimen.   Hypothyroid  -Continue Synthroid.      PVD/chronic RLE venous stasis & dermatitis/left BKA  - Left BKA stump is healed.   Hematuria/bilateral hydroureter/recent urology procedure - Gross hematuria has resolved. Continue Foley catheter. -  Please see urology recommendations 9/4  ? Hemoptysis - Apparently reported on admission but patient denies ever having.  OSA on nightly CPAP - Patient declined CPAP overnight.  DVT prophylaxis:  therapeutically anticoagulated.  Lines/catheters:  PIV/Foley Activity: Up with assistance. PT and OT ordered.  Nutrition:  Diabetic diet   Code Status:  full Family Communication:  none  Disposition Plan: Transfer to  medical bed.  Home when medically stable.   Consultants:  Urology  Procedures:   Foley   Antibiotics:   IV cefepime 9/2 >   Subjective: Denies specific complaints. Upset this morning after hearing GU status and need for further interventions. No acute issues per nursing.  Objective: Filed Vitals:   10/31/12 0600 10/31/12 0700 10/31/12 0800 10/31/12 1000  BP: 113/60 95/51 93/59  104/57  Pulse: 94 92 89 89  Temp:      TempSrc:      Resp: 26 25 16 25   Height:      Weight:      SpO2: 92% 92% 95% 100%    Intake/Output Summary (Last 24 hours) at 10/31/12 1044 Last data filed at 10/31/12 0900  Gross per 24 hour  Intake   3175 ml  Output   2925 ml  Net    250 ml   Filed Weights   10/29/12 2229 10/30/12 0733  Weight: 127.5 kg (281 lb 1.4 oz) 131.5 kg (289 lb 14.5 oz)     Exam:  General exam:  moderately built and morbidly obese male lying comfortably supine in bed.  Respiratory system: Clear. No increased work of breathing. Cardiovascular system: S1 & S2 heard, RRR. No JVD, murmurs, gallops, clicks. Trace chronic appearing 1+ right leg edema with chronic skin changes/hyperpigmentation. Tele: SR. Gastrointestinal system: Abdomen is nondistended, soft and mild suprapubic tenderness but without acute, guarding or rebound. Normal bowel sounds heard. Foley catheter +  Central nervous system: Alert and oriented. No focal neurological deficits. Extremities: Symmetric 5 x 5 power. Left BKA stump with compression stocking.   Data Reviewed: Basic Metabolic Panel:  Recent Labs Lab 10/24/12 1805 10/29/12 1642 10/30/12 0520 10/31/12 0335  NA 136 132* 134* 135  K 3.7 4.5 4.1 4.2  CL 106 102 108 109  CO2 23 18* 17* 18*  GLUCOSE 142* 185* 135* 163*  BUN 24* 71* 65* 61*  CREATININE 1.90* 5.09* 4.25* 3.62*  CALCIUM 8.6 8.2* 7.5* 7.8*   Liver Function Tests:  Recent Labs Lab 10/29/12 1642  AST 10  ALT 10  ALKPHOS 102  BILITOT 0.4  PROT 6.0  ALBUMIN 2.3*   No results found for  this basename: LIPASE, AMYLASE,  in the last 168 hours No results found for this basename: AMMONIA,  in the last 168 hours CBC:  Recent Labs Lab 10/29/12 1642 10/30/12 0520 10/31/12 0335  WBC 5.6 4.8 4.3  NEUTROABS 4.3  --   --   HGB 7.6* 8.8* 8.8*  HCT 24.4* 27.7* 28.5*  MCV 84.4 83.4 83.6  PLT 187 153 165   Cardiac Enzymes:  Recent Labs Lab 10/29/12 2311 10/30/12 0520 10/30/12 1050  TROPONINI <0.30 <0.30 <0.30   BNP (last 3 results) No results found for this basename: PROBNP,  in the last 8760 hours CBG:  Recent Labs Lab 10/30/12 0808 10/30/12 1211 10/30/12 1700 10/30/12 2152 10/31/12 0755  GLUCAP 118* 121* 92 129* 142*    Recent Results (from the past 240 hour(s))  SURGICAL PCR SCREEN     Status: None   Collection Time    10/24/12  9:15 AM      Result Value Range Status   MRSA, PCR NEGATIVE  NEGATIVE Final   Staphylococcus aureus NEGATIVE  NEGATIVE Final   Comment:            The Xpert SA Assay (FDA     approved for NASAL specimens     in patients over 21 years of  age),     is one component of     a comprehensive surveillance     program.  Test performance has     been validated by [facility] for patients greater     than or equal to 1 year old.     It is not intended     to diagnose infection nor to     guide or monitor treatment.  URINE CULTURE     Status: None   Collection Time    10/24/12 11:40 AM      Result Value Range Status   Specimen Description URINE, CLEAN CATCH   Final   Special Requests PATIENT ON FOLLOWING GENTAMICIN 650MG    Final   Culture  Setup Time     Final   Value: 10/24/2012 15:08     Performed at [facility]     Final   Value: NO GROWTH     Performed at [facility]   Culture     Final   Value: NO GROWTH     Performed at [facility]   Report Status 10/25/2012 FINAL   Final  MRSA PCR SCREENING     Status: None   Collection Time    10/29/12 10:25 PM      Result Value  Range Status   MRSA by PCR NEGATIVE  NEGATIVE Final   Comment:            The GeneXpert MRSA Assay (FDA     approved for NASAL specimens     only), is one component of a     comprehensive MRSA colonization     surveillance program. It is not     intended to diagnose MRSA     infection nor to guide or     monitor treatment for     MRSA infections.  CULTURE, BLOOD (ROUTINE X 2)     Status: None   Collection Time    10/29/12 11:05 PM      Result Value Range Status   Specimen Description BLOOD LEFT HAND   Final   Special Requests BOTTLES DRAWN AEROBIC ONLY 2CC   Final   Culture  Setup Time     Final   Value: 10/30/2012 05:21     Performed at [facility]   Culture     Final   Value:        BLOOD CULTURE RECEIVED NO GROWTH TO DATE CULTURE WILL BE HELD FOR 5 DAYS BEFORE ISSUING A FINAL NEGATIVE REPORT     Performed at [facility]   Report Status PENDING   Incomplete  CULTURE, BLOOD (ROUTINE X 2)     Status: None   Collection Time    10/29/12 11:11 PM      Result Value Range Status   Specimen Description BLOOD RIGHT ANTECUBITAL   Final   Special Requests BOTTLES DRAWN AEROBIC AND ANAEROBIC 3CC   Final   Culture  Setup Time     Final   Value: 10/30/2012 05:21     Performed at [facility]   Culture     Final   Value:        BLOOD CULTURE RECEIVED NO GROWTH TO DATE CULTURE WILL BE HELD FOR 5 DAYS BEFORE ISSUING A FINAL NEGATIVE REPORT     Performed at [facility]   Report Status PENDING   Incomplete  Additional labs: 1. Lipid panel: Cholesterol 100, TG 226, HDL 12, LDL 43 and VLDL 45. 2. Venous lactate on admission: 1.66 3. INR 9/2:3.04 4. Random cortisol: 8.6      Studies: Ct Abdomen Pelvis Wo Contrast  10/29/2012   *RADIOLOGY REPORT*  Clinical Data: Abdominal pain.  CT ABDOMEN AND PELVIS WITHOUT CONTRAST  Technique:  Multidetector CT imaging of the abdomen and pelvis was performed following the standard protocol without intravenous  contrast.  Comparison: CT of the abdomen and pelvis 10/10/2012.  Findings:  Lung Bases: Atherosclerotic calcifications of the left main, left anterior descending, left circumflex and right coronary arteries.  Abdomen/Pelvis:  Calcified gallstones layering dependently in the gallbladder.  No current findings to suggest acute cholecystitis at this time.  The visualized portions of the unenhanced liver, pancreas, spleen and bilateral adrenal glands are unremarkable.  As with the prior examination there are numerous nonobstructive calculi within the collecting systems of the kidneys bilaterally. The largest of these measures approximately 9 mm in the lower pole collecting system of the right kidney.  There are no calcified ureteral stones or bladder stones.  However, there continues to be diffuse perinephric and severe bilateral periureteric stranding (right greater than left).  As well, there appears to be some mild right-sided hydroureteronephrosis and mild left-sided hydroureter. The overall appearance is very similar to the prior examination from 10/10/2012.  The urinary bladder is nearly completely decompressed with a Foley balloon catheter in place.  A small amount of gas in the nondependent portion of the urinary bladder is presumably iatrogenic.  Numerous colonic diverticula are noted, without surrounding inflammatory changes to suggest acute diverticulitis at this time. No significant volume of ascites.  No pneumoperitoneum.  No pathologic distension of small bowel.  Normal appendix.  No definite pathologic lymphadenopathy identified within the abdomen on today's noncontrast CT examination, although there are numerous reactive size lymph nodes throughout the retroperitoneum. Additionally, there are enlarged lymph nodes along the pelvic side wall bilaterally, largest of which is in the left external iliac nodal stations measuring approximately 4.1 x 3.5 cm (image 90 of series 2); these are similar to numerous  prior examinations, and have actually decreased compared to remote prior study from 07/04/2010, and are presumably benign.  A small left inguinal hernia containing only fat incidentally noted.  Musculoskeletal: Status post bilateral total hip arthroplasty. Small locules of gas are noted in the subcutaneous fat of the anterior abdominal wall, presumably iatrogenic.  There are no aggressive appearing lytic or blastic lesions noted in the visualized portions of the skeleton.  IMPRESSION: 1.  Highly unusual appearance of the ureters and collecting systems of the kidneys bilaterally, similar to the recent prior examination, as above.  Although there are numerous nonobstructive calculi within the collecting systems of the kidneys bilaterally, there are no ureteral stones identified at this time.  Overall, the appearance is favored to represent distal ureteral strictures bilaterally (right greater than left) and/or recurrent upper tract urinary tract infection.  Clinical correlation is recommended. 2.  Cholelithiasis without evidence to suggest acute cholecystitis at this time. 3.  Normal appendix. 4.  Colonic diverticulosis without findings to suggest acute diverticulitis at this time. 5.  Atherosclerosis, including left main and three vessel coronary artery disease.  Please note that although the presence of coronary artery calcium documents the presence of coronary artery disease, the severity of this disease and any potential stenosis cannot be assessed on this non-gated CT examination.  Assessment for potential risk factor modification, dietary  therapy or pharmacologic therapy may be warranted, if clinically indicated. 6.  Additional incidental findings, as above.   Original Report Authenticated By: Vinnie Langton, M.D.   Ct Pelvis Wo Contrast  10/29/2012   *RADIOLOGY REPORT*  Clinical Data: Pelvic pain.  Recent history of bladder stretching. Evaluate for potential bladder perforation.  CT PELVIS WITHOUT CONTRAST   Technique:  Multidetector CT imaging of the pelvis was performed following the standard protocol without intravenous contrast.  Comparison: CT of the abdomen and pelvis without contrast 10/29/2012.  Findings: The bladder is opacified with contrast, with exception of the most nondependent portion which contains a small amount of gas. There is reflux of contrast material into the distal ureters bilaterally (left greater than right). Marked thickening of the urothelium in the ureters bilaterally and extensive periureteric and perinephric stranding bilaterally again noted.  No extravasation of contrast material outside the confines of the urinary bladder are noted. Status post bilateral total hip arthroplasty.  No other significant change compared to tonight's earlier examination.  IMPRESSION: 1.  No findings to suggest bladder rupture.  No significant change from earlier examination obtained tonight.   Original Report Authenticated By: Vinnie Langton, M.D.        Scheduled Meds: . atorvastatin  40 mg Oral QPM  . ceFEPime (MAXIPIME) IV  2 g Intravenous QHS  . docusate sodium  100 mg Oral BID  . ferrous sulfate  325 mg Oral BID  . insulin aspart  0-5 Units Subcutaneous QHS  . insulin aspart  0-9 Units Subcutaneous TID WC  . insulin glargine  10 Units Subcutaneous QHS  . levothyroxine  200 mcg Oral q morning - 10a  . multivitamin with minerals  1 tablet Oral Daily  . senna  1 tablet Oral BID  . sodium chloride  3 mL Intravenous Q12H   Continuous Infusions: . sodium chloride 1,000 mL (10/31/12 0800)    Principal Problem:   Sepsis Active Problems:   DIABETES MELLITUS-TYPE II   HYPERLIPIDEMIA   OBESITY   Acute posthemorrhagic anemia   DVT   Metabolic acidosis   Pyelonephritis   UI (urinary incontinence)   Renal failure, acute on chronic    Time spent: 30 minutes     [facility] Pager [phone].   If 8PM-8AM, please contact night-coverage at [email or web address],  password [facility] 10/31/2012, 10:44 AM  LOS: 2 days

## 2012-10-31 NOTE — Consult Note (Signed)
Urology Consult  Referring MD: [facility]   CC: Blood in urine  HPI: 57 year old male was admitted 3 days ago for acute renal failure, persistent hematuria following bladder biopsy a week ago, as well as hypotension. He has a history of multiple medical issues, and over the past year or so has developed a painful bladder. He underwent anesthetic cystoscopy, bilateral retrograde ureteropyelograms and bladder biopsy on August 28. That went without incident, and he was discharged the following day with a catheter in place. His biopsies revealed no evidence of neoplasia. His bladder capacity was quite small, less than 200 cc, and he had bilateral reflux. It is felt that his reflux is secondary to a noncompliant, high-pressure bladder.  While in the intensive care unit, he has stabilized. He has been transfused with 2 units of packed red blood cells. His probable UTI has been treated with IV antibiotics. Hemoglobin has improved, and his renal insufficiency is improving with hydration. He has been afebrile.  PMH: Past Medical History  Diagnosis Date  . Sleep apnea     uses cpap  . Hypothyroidism   . Anemia   . Blood transfusion   . Chronic kidney disease     hx of kidney stones  . Hypertension   . Arthritis     osteoarthritis  . Pneumonia   . Diabetes mellitus     insulin dependent  . DVT (deep vein thrombosis) in pregnancy   . Peripheral vascular disease   . Hyperlipidemia   . Morbid obesity   . Hypertrophy of prostate   . Acute respiratory failure   . Cellulitis and abscess of leg   . Chronic kidney disease, stage III (moderate)   . Nephrolithiasis   . Anxiety   . Chronic pain     PSH: Past Surgical History  Procedure Laterality Date  . Bilteral hip      replacement & revison  's   . Knee surgery      left knee    . Tonsillectomy    . Amputation  02/07/2011    Procedure: AMPUTATION BELOW KNEE;  Surgeon: Newt Minion, MD;  Location: [facility];  Service: Orthopedics;   Laterality: Left;  Left Below Knee Amputation  . Cystoscopy w/ retrogrades Bilateral 10/24/2012    Procedure: CYSTOSCOPY WITH RETROGRADE PYELOGRAM Procedure: Cystoscopy, Bilateral Retrogarde Pyelograms, Bladder Biopsy, Hydrodistension;  Surgeon: Franchot Gallo, MD;  Location: [facility];  Service: Urology;  Laterality: Bilateral;    Allergies: No Known Allergies  Medications: Prescriptions prior to admission  Medication Sig Dispense Refill  . atorvastatin (LIPITOR) 40 MG tablet Take 40 mg by mouth every evening. Patient takes in the evening      . ciprofloxacin (CIPRO) 500 MG tablet Take 1 tablet (500 mg total) by mouth 2 (two) times daily.  10 tablet  1  . cyclobenzaprine (FLEXERIL) 5 MG tablet Take 5 mg by mouth at bedtime.      . ferrous sulfate 325 (65 FE) MG tablet Take 325 mg by mouth 2 (two) times daily.       Marland Kitchen HYDROcodone-acetaminophen (NORCO/VICODIN) 5-325 MG per tablet Take 1 tablet by mouth every 6 (six) hours as needed for pain.       Marland Kitchen insulin aspart (NOVOLOG) 100 UNIT/ML injection Inject 5 Units into the skin 3 (three) times daily with meals. And q HS for BS> 150.      Marland Kitchen insulin glargine (LANTUS) 100 UNIT/ML injection Inject 40 Units into the skin at bedtime.       Marland Kitchen  levothyroxine (SYNTHROID, LEVOTHROID) 200 MCG tablet Take 200 mcg by mouth every morning.       . Multiple Vitamins-Minerals (MULTIVITAMINS THER. W/MINERALS) TABS Take 1 tablet by mouth daily.       Marland Kitchen oxyCODONE-acetaminophen (ROXICET) 5-325 MG per tablet Take 1 tablet by mouth every 4 (four) hours as needed for pain.  25 tablet  0  . phenazopyridine (PYRIDIUM) 100 MG tablet Take 100 mg by mouth 3 (three) times daily as needed for pain (for candidiasis).      . tamsulosin (FLOMAX) 0.4 MG CAPS capsule Take 0.4 mg by mouth at bedtime.       . traMADol (ULTRAM) 50 MG tablet Take 50 mg by mouth every 6 (six) hours as needed for pain.       Marland Kitchen lidocaine (XYLOCAINE) 2 % jelly Apply topically every 8 (eight) hours as needed  (urethral pain).  30 mL  0     Social History: History   Social History  . Marital Status: Married    Spouse Name: N/A    Number of Children: N/A  . Years of Education: N/A   Occupational History  . Not on file.   Social History Main Topics  . Smoking status: Never Smoker   . Smokeless tobacco: Never Used  . Alcohol Use: No  . Drug Use: No  . Sexual Activity: Yes   Other Topics Concern  . Not on file   Social History Narrative   Lives with his wife and uses a wheelchair for transfers.      Family History: Family History  Problem Relation Age of Onset  . Diabetes Mother   . Colon cancer Father   . Dementia Father   . Heart attack Father   . Heart attack Mother   . Stroke Mother     Review of Systems: Positive: Persistent bladder pain, recent hematuria, incontinence   Physical Exam: @VITALS2 @ General: No acute distress. He was sleeping, but easily arousable Head:  Normocephalic.  Atraumatic. ENT:  EOMI.  Mucous membranes moist Neck:  Supple.  No lymphadenopathy. CV:  S1 present. S2 present. Regular rate. Pulmonary: Equal effort bilaterally.  Clear to auscultation bilaterally. Abdomen: Soft, obese, non- tender to palpation. Skin:  Normal turgor.  No visible rash. Extremity: No gross deformity of bilateral upper extremities.  No gross deformity of    bilateral lower extremities. Neurologic: Alert. Appropriate mood.    Studies:  Recent Labs     10/30/12  0520  10/31/12  0335  HGB  8.8*  8.8*  WBC  4.8  4.3  PLT  153  165    Recent Labs     10/30/12  0520  10/31/12  0335  NA  134*  135  K  4.1  4.2  CL  108  109  CO2  17*  18*  BUN  65*  61*  CREATININE  4.25*  3.62*  CALCIUM  7.5*  7.8*  GFRNONAA  15*  18*  GFRAA  17*  20*     Recent Labs     10/29/12  1818  10/31/12  0335  INR  3.06*  3.04*  APTT  59*   --      No components found with this basename: ABG,     Assessment:  1. Renal insufficiency, recurrent. This is most  likely due to sepsis. This is improving. The patient does not have any obvious obstruction. Bilateral hydroureteronephrosis is more than likely from reflux from a high-pressure noncompliant  bladder.  2. Bilateral nephrolithiasis. This is currently asymptomatic  3. 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 I do not know the etiology of this-the patient states that this is only been an issue with him for the past few months. This is a difficult problem for him. He may eventually need a supravesical diversion i.e. ileal conduit. I would like to eventually schedule a referral to a tertiary care center for consultation  Plan: 1. I would leave the catheter in for now. Despite causing the patient bladder discomfort, I think it is best to leave his bladder decompressed.  2. I will see about eventual referral, perhaps to Dr. Amalia Hailey at [facility] Department of urology. We will get that scheduled through our office  3. Management of medical issues per hospitalist. I appreciate your assistance with this difficult patient.    Pager:[phone]

## 2012-11-01 DIAGNOSIS — A419 Sepsis, unspecified organism: Secondary | ICD-10-CM | POA: Diagnosis not present

## 2012-11-01 DIAGNOSIS — E872 Acidosis: Secondary | ICD-10-CM

## 2012-11-01 DIAGNOSIS — N179 Acute kidney failure, unspecified: Secondary | ICD-10-CM | POA: Diagnosis not present

## 2012-11-01 DIAGNOSIS — N390 Urinary tract infection, site not specified: Secondary | ICD-10-CM | POA: Diagnosis not present

## 2012-11-01 DIAGNOSIS — R310 Gross hematuria: Secondary | ICD-10-CM | POA: Diagnosis not present

## 2012-11-01 DIAGNOSIS — N12 Tubulo-interstitial nephritis, not specified as acute or chronic: Secondary | ICD-10-CM | POA: Diagnosis not present

## 2012-11-01 LAB — CBC
MCV: 82.6 fL (ref 78.0–100.0)
Platelets: 179 10*3/uL (ref 150–400)
RDW: 17.5 % — ABNORMAL HIGH (ref 11.5–15.5)
WBC: 4.3 10*3/uL (ref 4.0–10.5)

## 2012-11-01 LAB — GLUCOSE, CAPILLARY: Glucose-Capillary: 211 mg/dL — ABNORMAL HIGH (ref 70–99)

## 2012-11-01 LAB — BASIC METABOLIC PANEL
Calcium: 8 mg/dL — ABNORMAL LOW (ref 8.4–10.5)
Creatinine, Ser: 2.61 mg/dL — ABNORMAL HIGH (ref 0.50–1.35)
GFR calc Af Amer: 30 mL/min — ABNORMAL LOW (ref >90)

## 2012-11-01 LAB — PROTIME-INR
INR: 2.09 — ABNORMAL HIGH (ref 0.00–1.49)
Prothrombin Time: 22.8 seconds — ABNORMAL HIGH (ref 11.6–15.2)

## 2012-11-01 LAB — OCCULT BLOOD X 1 CARD TO LAB, STOOL: Fecal Occult Bld: NEGATIVE

## 2012-11-01 MED ORDER — WARFARIN SODIUM 5 MG PO TABS
5.0000 mg | ORAL_TABLET | Freq: Once | ORAL | Status: AC
  Administered 2012-11-01: 5 mg via ORAL
  Filled 2012-11-01: qty 1

## 2012-11-01 MED ORDER — TRAMADOL HCL 50 MG PO TABS
50.0000 mg | ORAL_TABLET | Freq: Four times a day (QID) | ORAL | Status: DC | PRN
  Administered 2012-11-01 – 2012-11-02 (×2): 50 mg via ORAL
  Filled 2012-11-01 (×3): qty 1

## 2012-11-01 MED ORDER — STERILE WATER FOR INJECTION IV SOLN
INTRAVENOUS | Status: DC
  Administered 2012-11-01 – 2012-11-02 (×2): via INTRAVENOUS
  Filled 2012-11-01 (×4): qty 850

## 2012-11-01 MED ORDER — HYDROCODONE-ACETAMINOPHEN 5-325 MG PO TABS
1.0000 | ORAL_TABLET | ORAL | Status: AC | PRN
  Administered 2012-11-01 – 2012-11-02 (×4): 1 via ORAL
  Filled 2012-11-01 (×4): qty 1

## 2012-11-01 MED ORDER — WARFARIN - PHARMACIST DOSING INPATIENT: Freq: Every day | Status: DC

## 2012-11-01 MED ORDER — CIPROFLOXACIN HCL 500 MG PO TABS
500.0000 mg | ORAL_TABLET | Freq: Two times a day (BID) | ORAL | Status: DC
  Administered 2012-11-01 – 2012-11-04 (×6): 500 mg via ORAL
  Filled 2012-11-01 (×8): qty 1

## 2012-11-01 NOTE — Progress Notes (Signed)
ANTIBIOTIC CONSULT NOTE - FOLLOW UP  Pharmacy Consult for cefepime Indication: UTI  No Known Allergies  Patient Measurements: Height: 5\' 11"  (180.3 cm) Weight: 289 lb 14.5 oz (131.5 kg) IBW/kg (Calculated) : 75.3   Vital Signs: Temp: 98.4 F (36.9 C) (09/05 0702) Temp src: Oral (09/05 0702) BP: 128/66 mmHg (09/05 0702) Pulse Rate: 79 (09/05 0702) Intake/Output from previous day: 09/04 0701 - 09/05 0700 In: 4723.3 [P.O.:1300; I.V.:3373.3; IV Piggyback:50] Out: [id number] [Urine:4375] Intake/Output from this shift: Total I/O In: 360 [P.O.:360] Out: 775 [Urine:775]  Labs:  Recent Labs  10/30/12 0137 10/30/12 0520 10/31/12 0335 11/01/12 0410  WBC  --  4.8 4.3 4.3  HGB  --  8.8* 8.8* 9.1*  PLT  --  153 165 179  LABCREA 58.1  --   --   --   CREATININE  --  4.25* 3.62* 2.61*   Estimated Creatinine Clearance: 44.8 ml/min (by C-G formula based on Cr of 2.61).   Microbiology: 9/2 blood: ngtd 8/28 urine: NGF 9/3 urine: NGF  Anti-infectives: Ciprofloxacin PTA 8/28 >> gentamicin >> 8/29 9/2 >> cefepime >>  Assessment: 23 yoM admitted 9/2 from home with urinary retention and hematuria s/p cystoscopy, bilateral retrograde ureteropyelogram, as well as hydrodistention and bladder biopsy on 8/28.   Pyuria was seen on cytoscopy 8/28 and the patient received gentamicin pre- and post- procedure. Patient was then discharged to home on 8/29.  Upon admission on 9/2, patient was in acute renal failure, which is improving with IV fluids and foley catheter.  Today is day 4 of broad spectrum IV antibiotics this admission  Patient remains afebrile  Cultures remain negative to date as above  WBC remain WNL at 4.3  SCr has improved to 2.61, CrCl ~40 mL/min  Cefepime has been dosed per weight and renal function  Goal of Therapy:  eradication of infection  Plan:  - continue cefepime 2g IV q24h - follow-up culture results, clinical course - follow-up antibiotic de-escalation  and length of therapy  Thank you for the consult.  Johny Drilling, PharmD Clinical Pharmacist Pager: [phone] Pharmacy: [phone] 11/01/2012 11:20 AM

## 2012-11-01 NOTE — Progress Notes (Signed)
Subjective: Patient reports that he feels okay this morning.  Still some bothered by the catheter.  Objective: Vital signs in last 24 hours: Temp:  [97.5 F (36.4 C)-98.4 F (36.9 C)] 98.4 F (36.9 C) (09/05 0702) Pulse Rate:  [79-90] 79 (09/05 0702) Resp:  [16-25] 16 (09/05 0702) BP: (104-136)/(57-66) 128/66 mmHg (09/05 0702) SpO2:  [97 %-100 %] 100 % (09/05 0702)  Intake/Output from previous day: 09/04 0701 - 09/05 0700 In: 4723.3 [P.O.:1300; I.V.:3373.3; IV Piggyback:50] Out: [id number] [Urine:4375] Intake/Output this shift:    Physical Exam:  Constitutional: Vital signs reviewed. WD WN in NAD   Eyes: PERRL, No scleral icterus.   Pulmonary/Chest: Normal effort Abdominal: Soft. Non-tender, non-distended, bowel sounds are normal, no masses, organomegaly, or guarding present.  Genitourinary:penis buried in suprapubic fat.  Catheter adequately positioned. Extremities: No cyanosis or edema    Urine is clearing, still somewhat pink. Lab Results:  Recent Labs  10/30/12 0520 10/31/12 0335 11/01/12 0410  HGB 8.8* 8.8* 9.1*  HCT 27.7* 28.5* 29.0*   BMET  Recent Labs  10/31/12 0335 11/01/12 0410  NA 135 133*  K 4.2 4.0  CL 109 109  CO2 18* 16*  GLUCOSE 163* 204*  BUN 61* 47*  CREATININE 3.62* 2.61*  CALCIUM 7.8* 8.0*    Recent Labs  10/29/12 1818 10/31/12 0335 11/01/12 0410  INR 3.06* 3.04* 2.09*   No results found for this basename: LABURIN,  in the last 72 hours Results for orders placed during the hospital encounter of 10/29/12  MRSA PCR SCREENING     Status: None   Collection Time    10/29/12 10:25 PM      Result Value Range Status   MRSA by PCR NEGATIVE  NEGATIVE Final   Comment:            The GeneXpert MRSA Assay (FDA     approved for NASAL specimens     only), is one component of a     comprehensive MRSA colonization     surveillance program. It is not     intended to diagnose MRSA     infection nor to guide or     monitor treatment for   MRSA infections.  CULTURE, BLOOD (ROUTINE X 2)     Status: None   Collection Time    10/29/12 11:05 PM      Result Value Range Status   Specimen Description BLOOD LEFT HAND   Final   Special Requests BOTTLES DRAWN AEROBIC ONLY 2CC   Final   Culture  Setup Time     Final   Value: 10/30/2012 05:21     Performed at [facility]   Culture     Final   Value:        BLOOD CULTURE RECEIVED NO GROWTH TO DATE CULTURE WILL BE HELD FOR 5 DAYS BEFORE ISSUING A FINAL NEGATIVE REPORT     Performed at [facility]   Report Status PENDING   Incomplete  CULTURE, BLOOD (ROUTINE X 2)     Status: None   Collection Time    10/29/12 11:11 PM      Result Value Range Status   Specimen Description BLOOD RIGHT ANTECUBITAL   Final   Special Requests BOTTLES DRAWN AEROBIC AND ANAEROBIC 3CC   Final   Culture  Setup Time     Final   Value: 10/30/2012 05:21     Performed at [facility]   Culture     Final  Value:        BLOOD CULTURE RECEIVED NO GROWTH TO DATE CULTURE WILL BE HELD FOR 5 DAYS BEFORE ISSUING A FINAL NEGATIVE REPORT     Performed at [facility]   Report Status PENDING   Incomplete  URINE CULTURE     Status: None   Collection Time    10/30/12 12:30 PM      Result Value Range Status   Specimen Description URINE, CATHETERIZED   Final   Special Requests NONE   Final   Culture  Setup Time     Final   Value: 10/30/2012 15:34     Performed at [place]     Final   Value: NO GROWTH     Performed at [facility]   Culture     Final   Value: NO GROWTH     Performed at [facility]   Report Status 10/31/2012 FINAL   Final    Studies/Results: No results found.  Assessment/Plan:   1.  Urinary tract infection, treated.  Culture upon admission was negative, the patient is improving on antibiotics  2.  Acute renal failure, improving with catheter drainage.  3.  Hematuria, resolving  I think it is okay to start him  back on his Coumadin.  The catheter should be left-the current catheter is fine.  He can go home at any time from our standpoint, with the catheter.  I will try to arrange follow-up with a specialist at [facility] .  I think it worthwhile to teach him how to change from a bedside bag to a leg bag-that will assist with physical therapy.   LOS: 3 days   Franchot Gallo M 11/01/2012, 8:39 AM

## 2012-11-01 NOTE — Progress Notes (Signed)
CSW attempted x 3 to see pt re: disposition planning as PT recommended SNF vs home with [facility] and assistance from pt wife. Pt was sleeping or receiving self care during each attempted visit.  CSW reviewed chart and MD note states that pt declines SNF placement.  No further social work needs identified at this time.  CSW signing off.  Please re-consult if social work needs arise.   Drake Leach, MSW, [place] Work [phone]

## 2012-11-01 NOTE — Progress Notes (Signed)
11/01/2012 Sherrin Daisy BSN RN CCM [phone] Request to help establish pt with PCP for coumadin management. Spoke with patient and spouse who state patient does have health insurance. Currently does not have PCP but would like to re-establish as a patient with [facility] in [place]. Pt also states he has home health services active with [facility]. Uses wheelchair in the home. Pt lives in [place] and wants to establish with physician in [place] on [place].  CM called [facility] in [place] and spoke with Melissa who advised that they would not be able to take patient d/t noncompliance. States they have sent letter discharing patient from their practive in June of 2014. Neither [facility] or [facility] can accept  patient; cannot manage coumadin.  [facility] will not be able to accept patient without having [facility] MD.  CM called [facility]; spoke with [facility] in [place] [phone]) who states they can do coumadin management for patient. Demographic sheet, pharmacy note, and referral to [place] form filled out and faxed to [phone](confirmation received). Patient will need to call-[facility]  [phone] to set appointment  at coumadin clinic after he is discharged. Pt advised. CM also left msg on spouse's phone regarding the above. Dr Algis Liming advised of the above.

## 2012-11-01 NOTE — Progress Notes (Signed)
Physical Therapy Treatment Patient Details Name: [name] MRN: [id number] DOB: [birth year] Today's Date: 11/01/2012 Time: [id number] PT Time Calculation (min): 32 min  PT Assessment / Plan / Recommendation  History of Present Illness     PT Comments   Progressing slowly. Pt continues to require max encouragement for participation and for pt to put forth full effort. Pt still requires Mod assist for mobility at this time. Pt's wife will need to be able to provide this level of assist. Continue to recommend SNF but pt is refusing. If discharges home, will need HHPT/HHOT/aide.   Follow Up Recommendations  SNF     Does the patient have the potential to tolerate intense rehabilitation     Barriers to Discharge        Equipment Recommendations  None recommended by PT    Recommendations for Other Services OT consult  Frequency Min 3X/week   Progress towards PT Goals Progress towards PT goals: Progressing toward goals  Plan Current plan remains appropriate    Precautions / Restrictions Precautions Precautions: Fall Precaution Comments: L BKA. Pt prefers to wear shoe on R foot for transfers. Restrictions Weight Bearing Restrictions: No   Pertinent Vitals/Pain Catheter 5/10     Mobility  Bed Mobility Bed Mobility: Supine to Sit;Sit to Supine Supine to Sit: 3: Mod assist Sit to Supine: 3: Mod assist Details for Bed Mobility Assistance: Assist for trunk and R LE. Increased time. VCs safety, technique, hand placement Transfers Transfers: Lateral/Scoot Transfers Lateral/Scoot Transfers: With slide board;3: Mod assist Details for Transfer Assistance: x2, bed<>wheelchair. Increased time. Assist with anterior weight shift  and scoot. Utilized bedpad to aid with scooting/sliding. Max encouragement for pt to put forth max effort.     Exercises     PT Diagnosis:    PT Problem List:   PT Treatment Interventions:     PT Goals (current goals can now be found in the care plan  section)    Visit Information  Last PT Received On: 11/01/12 Assistance Needed: +2 (safety)    Subjective Data      Cognition  Cognition Arousal/Alertness: Awake/alert Behavior During Therapy: WFL for tasks assessed/performed Overall Cognitive Status: Within Functional Limits for tasks assessed    Balance     End of Session PT - End of Session Equipment Utilized During Treatment: Gait belt Activity Tolerance: Patient limited by fatigue;Patient limited by pain Patient left: in bed;with call bell/phone within reach   GP     [name], MPT Pager: [phone]

## 2012-11-01 NOTE — Progress Notes (Signed)
ANTICOAGULATION CONSULT NOTE - Initial Consult  Pharmacy Consult for Coumadin Indication: remote history of DVT  No Known Allergies  Patient Measurements: Height: 5\' 11"  (180.3 cm) Weight: 289 lb 14.5 oz (131.5 kg) IBW/kg (Calculated) : 75.3  Vital Signs: Temp: 98.4 F (36.9 C) (09/05 0702) Temp src: Oral (09/05 0702) BP: 128/66 mmHg (09/05 0702) Pulse Rate: 79 (09/05 0702)  Labs:  Recent Labs  10/29/12 1818 10/29/12 2311 10/30/12 0520 10/30/12 1050 10/31/12 0335 11/01/12 0410  HGB  --   --  8.8*  --  8.8* 9.1*  HCT  --   --  27.7*  --  28.5* 29.0*  PLT  --   --  153  --  165 179  APTT 59*  --   --   --   --   --   LABPROT 30.5*  --   --   --  30.4* 22.8*  INR 3.06*  --   --   --  3.04* 2.09*  CREATININE  --   --  4.25*  --  3.62* 2.61*  TROPONINI  --  <0.30 <0.30 <0.30  --   --     Estimated Creatinine Clearance: 44.8 ml/min (by C-G formula based on Cr of 2.61).    Assessment: 77 yoM admitted 9/2 with urinary retention and hematuria s/p cystoscopy, bilateral retrograde ureteropyelogram, as well as hydrodistention and bladder biopsy on 8/28. Bladder hemorrhage has been ruled out by CT. Patient is on chronic Coumadin PTA for a remote history of a DVT and his dosing had been managed at his nursing home ([facility]).   Per nursing home notes the patient's home Coumadin dose is 3mg  daily. Coumadin was held on admission with a slightly SUPRA-therapeutic INR at 3.06, anemia, and hematuria.   Hematuria is now resolving and Pharmacy has been consulted to restart Coumadin per Urology and the Hospitalist services.  Per discussion with RN at [facility] and the patient's wife, patient's last dose of Coumadin was 8/23 prior to urology procedure  The patient was then discharged from the facility upon admission to [facility] on 8/28  Pt was discharged to home on 8/29 and will not be returning to [facility] at discharge from this admission  The patient will need  to be set-up with a PCP to manage his INR upon discharge and Case Management has been consulted to aid in this process   INR today in therapeutic range at 2.09 with a large drop from 3.04 on 9/4, no reversal agents or FFP have been given, however the patient is s/p 2 units PRBC on 9/3  I expect another large drop in INR by tomorrow, but I am concerned about the elevated INR on admission if the patient truly had not had a dose of Coumadin for 10 days prior to this admission  Hgb has improved to 9.1 and platelets remain WNL  Pharmacy will monitor INR daily while in-patient, but the patient will need quick follow-up for INR within 3-4 days post-discharge  Goal of Therapy:  INR 2-3 Monitor platelets by anticoagulation protocol: Yes   Plan:  - Coumadin 5mg  PO x1 tonight - daily INR - CBC in AM and at least q72h while in-patient - monitor closely for sign of bleeding - follow-up out-patient PCP and discharge plans  Thank you for the consult.  Johny Drilling, PharmD Clinical Pharmacist Pager: [phone] Pharmacy: [phone] 11/01/2012 10:57 AM

## 2012-11-01 NOTE — Progress Notes (Signed)
Pt has his cpap machine from home with full face mask.  When offered, pt refused help with getting cpap on tonight.  Pt stated he can do it himself.  2l o2 bled in through machine.  Pt encouraged to call should he need further assistance.

## 2012-11-01 NOTE — Progress Notes (Signed)
[facility] PROGRESS NOTE  [name] [id number] DOB: 01-Jun-1955 DOA: 10/29/2012 PCP: No primary provider on file.  HPI/Brief narrative 57 y.o. 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 He underwent bladder biopsy and stretching on 8/28 by [name]. Post-operatively, he had some hematuria which looked like gross blood for the last several days and which prompted his home health RN to have him brought to the hospital. He also complained of fatigue, lightheadedness and feeling feverish. He specifically denied coughing blood at any time, or dyspnea or chest pain. In the ED, patient was hypotensive in the 60s/40s, Labs were notable for Na 132, bicarb 18, BUN 71, Cr 5.09 (baseline 2), albumin 2.3, WBC 5.6, hgb 7.6 (baseline 10mg /dl), plt 187, UA +, INR 3.06 & CT abd/pelvis without contrast demonstrated numerous nonobstructive calculi within the collecting systems of the kidneys bilaterally without ureteral stones and suggestion of distal ureteral strictures right greater than left and/or possible recurrent upper UTI. Critical care was consulted by EDP and recommended hospitalist admit. Admitted to step down unit and aggressively hydrated, transfuse 2 units of PRBCs and treated with broad-spectrum IV antibiotics. Urology consulted. Patient's renal functions continued to improve.   Assessment/Plan:  Sepsis, present on admission/acute pyelonephritis - Possibly secondary to acute pyelonephritis. - Unfortunately urine culture was sent after patient had been started on antibiotics. - Patient was empirically treated with IV cefepime. We'll switch to oral Cipro on 9/5 to complete a total 10 days treatment. - Blood culture x2 negative to date. Urine culture  negative. - Was aggressively hydrated since admission. Blood pressures have stabilized.  Hypotension -DD: Sepsis, dehydration, anemia. - Improved. Continue antibiotics and IVF.  - Random cortisol 8.6. - Resolved.  Acute on stage III chronic kidney disease - Baseline creatinine: 2 - DD: Dehydration, hypotension/ATN/ hydroureters - FeNA: 5.9 - Improving. Continue IV fluids and Foley catheter. 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 ideal conduit-referral to tertiary care center will be arranged by urology as outpatient..  Acute on chronic anemia - DD: Hematuria, critical illness - Status post 2 units PRBCs. Improved & stable - Coumadin was on hold and will be resumed on 9/5. - Continue ferrous sulfate  DVT/PE on chronic Coumadin - Coumadin currently on hold. INR 2.09 - Mild tinge of hematuria in Foley bag.  - Coumadin being resumed on 9/5.  Non-anion gap metabolic acidosis - Possibly secondary to a AKI. - We'll change IV fluids to bicarbonate drip.  Type II DM - Hemoglobin A1c 4.9 less than 3 months ago suggesting very tight control. - Lantus reduced to 10 units daily and continue SSI. Monitor closely. - CBGs rising, may need to titrate insulins.  Constipation  -Likely narcotic induced. Continue bowel regimen.   Hypothyroid  -Continue Synthroid.      PVD/chronic RLE venous stasis & dermatitis/left BKA  - Left BKA stump is healed.   Hematuria/bilateral hydroureter/recent urology procedure - Gross hematuria has resolved. Continue Foley catheter. -  Please see urology recommendations 9/4  ? Hemoptysis - Apparently reported on admission but patient denies ever having.  OSA on nightly CPAP - Patient declined CPAP overnight.  DVT prophylaxis:  therapeutically anticoagulated/starting Coumadin 9/5.  Lines/catheters:  PIV/Foley Activity: Up with  assistance. PT and OT ordered.  Nutrition:  Diabetic diet   Code Status:  full Family Communication:  none  Disposition Plan: Patient declines SNF. Home when medically stable, possibly in the next 48 hours.  Consultants:  Urology  Procedures:   Foley   Antibiotics:  IV cefepime 9/2 > 9/5  Oral Cipro 9/5 >   Subjective: Intermittent discomfort secondary to Foley catheter. No other specific complaints.  Objective: Filed Vitals:   10/31/12 1348 10/31/12 2132 10/31/12 2240 11/01/12 0702  BP: 107/59 136/65  128/66  Pulse: 84 90 88 79  Temp: 98 F (36.7 C) 98.3 F (36.8 C)  98.4 F (36.9 C)  TempSrc:  Oral  Oral  Resp: 18 20  16   Height:      Weight:      SpO2: 97% 98%  100%    Intake/Output Summary (Last 24 hours) at 11/01/12 1430 Last data filed at 11/01/12 1032  Gross per 24 hour  Intake 4343.34 ml  Output   4650 ml  Net -306.66 ml   Filed Weights   10/29/12 2229 10/30/12 0733  Weight: 127.5 kg (281 lb 1.4 oz) 131.5 kg (289 lb 14.5 oz)     Exam:  General exam:  moderately built and morbidly obese male lying comfortably supine in bed.  Respiratory system: Clear. No increased work of breathing. Cardiovascular system: S1 & S2 heard, RRR. No JVD, murmurs, gallops, clicks. Trace chronic appearing 1+ right leg edema with chronic skin changes/hyperpigmentation.  Gastrointestinal system: Abdomen is nondistended, soft and mild suprapubic tenderness but without acute, guarding or rebound. Normal bowel sounds heard. Foley catheter +  Central nervous system: Alert and oriented. No focal neurological deficits. Extremities: Symmetric 5 x 5 power. Left BKA stump with compression stocking.   Data Reviewed: Basic Metabolic Panel:  Recent Labs Lab 10/29/12 1642 10/30/12 0520 10/31/12 0335 11/01/12 0410  NA 132* 134* 135 133*  K 4.5 4.1 4.2 4.0  CL 102 108 109 109  CO2 18* 17* 18* 16*  GLUCOSE 185* 135* 163* 204*  BUN 71* 65* 61* 47*  CREATININE 5.09* 4.25*  3.62* 2.61*  CALCIUM 8.2* 7.5* 7.8* 8.0*   Liver Function Tests:  Recent Labs Lab 10/29/12 1642  AST 10  ALT 10  ALKPHOS 102  BILITOT 0.4  PROT 6.0  ALBUMIN 2.3*   No results found for this basename: LIPASE, AMYLASE,  in the last 168 hours No results found for this basename: AMMONIA,  in the last 168 hours CBC:  Recent Labs Lab 10/29/12 1642 10/30/12 0520 10/31/12 0335 11/01/12 0410  WBC 5.6 4.8 4.3 4.3  NEUTROABS 4.3  --   --   --   HGB 7.6* 8.8* 8.8* 9.1*  HCT 24.4* 27.7* 28.5* 29.0*  MCV 84.4 83.4 83.6 82.6  PLT 187 153 165 179   Cardiac Enzymes:  Recent Labs Lab 10/29/12 2311 10/30/12 0520 10/30/12 1050  TROPONINI <0.30 <0.30 <0.30   BNP (last 3 results) No results found for this basename: PROBNP,  in the last 8760 hours CBG:  Recent Labs Lab 10/31/12 1233 10/31/12 1741 10/31/12 2135 11/01/12 0747 11/01/12 1215  GLUCAP 173* 168* 217* 151* 211*    Recent Results (from the past 240 hour(s))  SURGICAL PCR SCREEN     Status: None   Collection Time    10/24/12  9:15 AM      Result Value Range Status   MRSA, PCR NEGATIVE  NEGATIVE Final   Staphylococcus aureus  NEGATIVE  NEGATIVE Final   Comment:            The Xpert SA Assay (FDA     approved for NASAL specimens     in patients over 87 years of age),     is one component of     a comprehensive surveillance     program.  Test performance has     been validated by [employer] for patients greater     than or equal to 12 year old.     It is not intended     to diagnose infection nor to     guide or monitor treatment.  URINE CULTURE     Status: None   Collection Time    10/24/12 11:40 AM      Result Value Range Status   Specimen Description URINE, CLEAN CATCH   Final   Special Requests PATIENT ON FOLLOWING GENTAMICIN 650MG    Final   Culture  Setup Time     Final   Value: 10/24/2012 15:08     Performed at [facility]     Final   Value: NO GROWTH     Performed  at [facility]   Culture     Final   Value: NO GROWTH     Performed at [facility]   Report Status 10/25/2012 FINAL   Final  MRSA PCR SCREENING     Status: None   Collection Time    10/29/12 10:25 PM      Result Value Range Status   MRSA by PCR NEGATIVE  NEGATIVE Final   Comment:            The GeneXpert MRSA Assay (FDA     approved for NASAL specimens     only), is one component of a     comprehensive MRSA colonization     surveillance program. It is not     intended to diagnose MRSA     infection nor to guide or     monitor treatment for     MRSA infections.  CULTURE, BLOOD (ROUTINE X 2)     Status: None   Collection Time    10/29/12 11:05 PM      Result Value Range Status   Specimen Description BLOOD LEFT HAND   Final   Special Requests BOTTLES DRAWN AEROBIC ONLY 2CC   Final   Culture  Setup Time     Final   Value: 10/30/2012 05:21     Performed at [facility]   Culture     Final   Value:        BLOOD CULTURE RECEIVED NO GROWTH TO DATE CULTURE WILL BE HELD FOR 5 DAYS BEFORE ISSUING A FINAL NEGATIVE REPORT     Performed at [facility]   Report Status PENDING   Incomplete  CULTURE, BLOOD (ROUTINE X 2)     Status: None   Collection Time    10/29/12 11:11 PM      Result Value Range Status   Specimen Description BLOOD RIGHT ANTECUBITAL   Final   Special Requests BOTTLES DRAWN AEROBIC AND ANAEROBIC 3CC   Final   Culture  Setup Time     Final   Value: 10/30/2012 05:21     Performed at [facility]   Culture     Final   Value:        BLOOD  CULTURE RECEIVED NO GROWTH TO DATE CULTURE WILL BE HELD FOR 5 DAYS BEFORE ISSUING A FINAL NEGATIVE REPORT     Performed at [facility]   Report Status PENDING   Incomplete  URINE CULTURE     Status: None   Collection Time    10/30/12 12:30 PM      Result Value Range Status   Specimen Description URINE, CATHETERIZED   Final   Special Requests NONE   Final   Culture  Setup Time      Final   Value: 10/30/2012 15:34     Performed at [place]     Final   Value: NO GROWTH     Performed at [facility]   Culture     Final   Value: NO GROWTH     Performed at [facility]   Report Status 10/31/2012 FINAL   Final      Additional labs: 1. Lipid panel: Cholesterol 100, TG 226, HDL 12, LDL 43 and VLDL 45. 2. Venous lactate on admission: 1.66 3. INR 9/2:3.04 4. Random cortisol: 8.6      Studies: No results found.      Scheduled Meds: . atorvastatin  40 mg Oral QPM  . ceFEPime (MAXIPIME) IV  2 g Intravenous QHS  . docusate sodium  100 mg Oral BID  . ferrous sulfate  325 mg Oral BID  . insulin aspart  0-5 Units Subcutaneous QHS  . insulin aspart  0-9 Units Subcutaneous TID WC  . insulin glargine  10 Units Subcutaneous QHS  . levothyroxine  200 mcg Oral q morning - 10a  . multivitamin with minerals  1 tablet Oral Daily  . senna  1 tablet Oral BID  . sodium chloride  3 mL Intravenous Q12H  . warfarin  5 mg Oral ONCE-1800  . Warfarin - Pharmacist Dosing Inpatient   Does not apply q1800   Continuous Infusions: . sodium chloride 1,000 mL (11/01/12 0338)    Principal Problem:   Sepsis Active Problems:   DIABETES MELLITUS-TYPE II   HYPERLIPIDEMIA   OBESITY   Acute posthemorrhagic anemia   DVT   Metabolic acidosis   Pyelonephritis   UI (urinary incontinence)   Renal failure, acute on chronic    Time spent: 30 minutes     [facility] Pager [phone].   If 8PM-8AM, please contact night-coverage at [email or web address], password [facility] 11/01/2012, 2:30 PM  LOS: 3 days

## 2012-11-02 DIAGNOSIS — R310 Gross hematuria: Secondary | ICD-10-CM | POA: Diagnosis not present

## 2012-11-02 DIAGNOSIS — N179 Acute kidney failure, unspecified: Secondary | ICD-10-CM | POA: Diagnosis not present

## 2012-11-02 DIAGNOSIS — E872 Acidosis: Secondary | ICD-10-CM | POA: Diagnosis not present

## 2012-11-02 DIAGNOSIS — D62 Acute posthemorrhagic anemia: Secondary | ICD-10-CM | POA: Diagnosis not present

## 2012-11-02 DIAGNOSIS — N12 Tubulo-interstitial nephritis, not specified as acute or chronic: Secondary | ICD-10-CM | POA: Diagnosis not present

## 2012-11-02 LAB — GLUCOSE, CAPILLARY: Glucose-Capillary: 200 mg/dL — ABNORMAL HIGH (ref 70–99)

## 2012-11-02 LAB — BASIC METABOLIC PANEL
BUN: 36 mg/dL — ABNORMAL HIGH (ref 6–23)
Calcium: 8.2 mg/dL — ABNORMAL LOW (ref 8.4–10.5)
GFR calc Af Amer: 40 mL/min — ABNORMAL LOW (ref >90)
GFR calc non Af Amer: 34 mL/min — ABNORMAL LOW (ref >90)
Potassium: 3.6 mEq/L (ref 3.5–5.1)

## 2012-11-02 LAB — CBC
Hemoglobin: 8.8 g/dL — ABNORMAL LOW (ref 13.0–17.0)
MCHC: 31.3 g/dL (ref 30.0–36.0)
Platelets: 164 10*3/uL (ref 150–400)
RDW: 17.1 % — ABNORMAL HIGH (ref 11.5–15.5)

## 2012-11-02 LAB — PROTIME-INR
INR: 1.83 — ABNORMAL HIGH (ref 0.00–1.49)
Prothrombin Time: 20.6 seconds — ABNORMAL HIGH (ref 11.6–15.2)

## 2012-11-02 MED ORDER — HYDROCODONE-ACETAMINOPHEN 5-325 MG PO TABS
1.0000 | ORAL_TABLET | Freq: Four times a day (QID) | ORAL | Status: AC | PRN
  Administered 2012-11-02 – 2012-11-04 (×7): 1 via ORAL
  Filled 2012-11-02 (×7): qty 1

## 2012-11-02 MED ORDER — WARFARIN SODIUM 4 MG PO TABS
4.0000 mg | ORAL_TABLET | Freq: Once | ORAL | Status: AC
  Administered 2012-11-02: 4 mg via ORAL
  Filled 2012-11-02: qty 1

## 2012-11-02 NOTE — Progress Notes (Addendum)
ANTICOAGULATION CONSULT NOTE - Initial Consult  Pharmacy Consult for Coumadin Indication: Hx of Bilateral PE 10 years ago, and remote hx of DVT  No Known Allergies  Patient Measurements: Height: 5\' 11"  (180.3 cm) Weight: 289 lb 14.5 oz (131.5 kg) IBW/kg (Calculated) : 75.3  Vital Signs: Temp: 98.7 F (37.1 C) (09/06 0616) Temp src: Oral (09/06 0616) BP: 110/70 mmHg (09/06 0616) Pulse Rate: 82 (09/06 0616)  Labs:  Recent Labs  10/30/12 1050  10/31/12 0335 11/01/12 0410 11/02/12 0451  HGB  --   < > 8.8* 9.1* 8.8*  HCT  --   --  28.5* 29.0* 28.1*  PLT  --   --  165 179 164  LABPROT  --   --  30.4* 22.8* 20.6*  INR  --   --  3.04* 2.09* 1.83*  CREATININE  --   --  3.62* 2.61* 2.09*  TROPONINI <0.30  --   --   --   --   < > = values in this interval not displayed.  Estimated Creatinine Clearance: 55.9 ml/min (by C-G formula based on Cr of 2.09).    Assessment: 53 yoM admitted 9/2 with urinary retention and hematuria s/p cystoscopy, bilateral retrograde ureteropyelogram, as well as hydrodistention and bladder biopsy on 8/28. Bladder hemorrhage has been ruled out by CT. Patient is on chronic Coumadin PTA for Bilateral PE ~ 10 year ago, and remote hx of DVT.  Dosing previously managed at his nursing home ([facility]).   Per nursing home notes the patient's home Coumadin dose is 3mg  daily. Coumadin was held on admission with a slightly SUPRA-therapeutic INR at 3.06, anemia, and hematuria.   Hematuria is now resolving and Pharmacy has been consulted to restart Coumadin per Urology and the Hospitalist services.  Per discussion with RN at [facility] and the patient's wife, patient's last dose of Coumadin was 8/23 prior to urology procedure  The patient was then discharged from the facility upon admission to [facility] on 8/28  Pt was discharged to home on 8/29 and will not be returning to [place] at discharge from this admission  The patient will need to be  set-up with a PCP to manage his INR upon discharge and Case Management has been consulted to aid in this process  I spoke with the patient today 9/6 - He is requesting to be on anticoagulation that does not require monitoring.  I discussed with him that Xarelto and Lovenox were both more expensive - Neither are great option for patient given his CKD and fluctuating renal function.  Dr. Algis Liming does not feel he will be compliant with giving himself a lovenox injection.  At this point will continue with warfarin, slightly difficult to dose at this time given patient not the best historian and confusing as to why his INR was elevated on admission.     Given that last dose of warfarin was reportedly 10 days PTA, it is concerning that his INR was slightly above goal on 9/2 (3.06).  INR decreased to 2.09 after holding warfarin x 3 days since admission, continues to trend down today (5 mg dose given last night).  Per MD hematuria has resolved and CBC remains stable, okay to proceed with anticoagulation.   Hg/H and Plt okay. No bleeding reported today  INR may continue to trend down x 1 day secondary to holding warfarin but will dose cautiously.   Goal of Therapy:  INR 2-3 Monitor platelets by anticoagulation protocol: Yes  Plan:  - Coumadin 4 mg PO x1 tonight - dosing cautiously given recent hematuria, now resolving. - Daily PT/INR, Monitor CBC and s/sx bleeding  - Monitor Drug interaction w/ Cipro, can cause INR to increase   Rahm Minix, Gaye Alken PharmD Pager #: [phone] 8:37 AM 11/02/2012

## 2012-11-02 NOTE — Progress Notes (Addendum)
[facility] PROGRESS NOTE  [name] [id number] DOB: 28-Dec-1955 DOA: 10/29/2012 PCP: No primary provider on file.  HPI/Brief narrative 57 y.o. 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 He underwent bladder biopsy and stretching on 8/28 by [name]. Post-operatively, he had some hematuria which looked like gross blood for the last several days and which prompted his home health RN to have him brought to the hospital. He also complained of fatigue, lightheadedness and feeling feverish. He specifically denied coughing blood at any time, or dyspnea or chest pain. In the ED, patient was hypotensive in the 60s/40s, Labs were notable for Na 132, bicarb 18, BUN 71, Cr 5.09 (baseline 2), albumin 2.3, WBC 5.6, hgb 7.6 (baseline 10mg /dl), plt 187, UA +, INR 3.06 & CT abd/pelvis without contrast demonstrated numerous nonobstructive calculi within the collecting systems of the kidneys bilaterally without ureteral stones and suggestion of distal ureteral strictures right greater than left and/or possible recurrent upper UTI. Critical care was consulted by EDP and recommended hospitalist admit. Admitted to step down unit and aggressively hydrated, transfuse 2 units of PRBCs and treated with broad-spectrum IV antibiotics. Urology consulted. Patient's renal functions continued to improve.   Assessment/Plan:  Sepsis, present on admission/acute pyelonephritis - Possibly secondary to acute pyelonephritis. - Unfortunately urine culture was sent after patient had been started on antibiotics. - Patient was empirically treated with IV cefepime. Switched to oral Cipro on 9/5 to complete a total 10 days treatment. - Blood culture x2 negative to date. Urine culture negative. -  Was aggressively hydrated since admission. Blood pressures have stabilized.  Hypotension -DD: Sepsis, dehydration, anemia. - Improved. Continue antibiotics and IVF.  - Random cortisol 8.6. - Resolved.  Acute on stage III chronic kidney disease - Baseline creatinine: 2 - DD: Dehydration, hypotension/ATN/ hydroureters - FeNA: 5.9 - Improving. Continue IV fluids and Foley catheter. 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 ideal conduit-referral to tertiary care center will be arranged by urology as outpatient. - Creatinine almost at baseline. DC IVF and follow BMP in am.  Acute on chronic anemia - DD: Hematuria, critical illness - Status post 2 units PRBCs. Improved & stable - Coumadin was on hold and will be resumed on 9/5. - Continue ferrous sulfate  DVT/PE on chronic Coumadin - Coumadin was on hold since sometime prior to urological procedure on 8/28. - Was held in hospital initially due to hematuria and anemia. - Gross hematuria has almost resolved. - Coumadin resumed on 9/5. - Not great candidate for Xarelto due to fluctuating renal functions, and may need further Urological procedures on DC.  Non-anion gap metabolic acidosis - Possibly secondary to a AKI. - Resolved. DC Bicarbonate drip.  Type II DM - Hemoglobin A1c 4.9 less than 3 months ago suggesting very tight control. - Lantus reduced to 10 units daily and continue SSI. Monitor closely. - CBGs rising, may need to titrate insulins.  Constipation  -Likely narcotic induced. Continue bowel regimen.   Hypothyroid  -Continue Synthroid.      PVD/chronic RLE venous stasis & dermatitis/left BKA  - Left BKA stump is healed.   Hematuria/bilateral hydroureter/recent urology procedure - Gross hematuria has resolved. Continue Foley catheter. -  Please see urology recommendations 9/4  ? Hemoptysis -  Apparently reported on admission but patient denies ever having.  OSA on nightly CPAP - Patient declined CPAP overnight.  DVT prophylaxis:  started Coumadin 9/5. Use Heparin/Lovenox if INR < 1.8 Lines/catheters:  PIV/Foley Activity: Up with assistance. PT and OT ordered.  Nutrition:  Diabetic diet   Code Status:  full Family Communication:  Left message for spouse. Disposition Plan: Patient declines SNF. Home when medically stable, possibly in the next 48 hours when able to arrange definite OP Coumadin follow up.  Consultants:  Urology  Procedures:   Foley   Antibiotics:  IV cefepime 9/2 > 9/5  Oral Cipro 9/5 >   Subjective: Intermittent discomfort secondary to Foley catheter. No other new complaints.  Objective: Filed Vitals:   11/01/12 1444 11/01/12 2050 11/01/12 2134 11/02/12 0616  BP: 112/71  133/69 110/70  Pulse: 82 80 82 82  Temp: 98 F (36.7 C)  98.2 F (36.8 C) 98.7 F (37.1 C)  TempSrc: Oral  Oral Oral  Resp: 16  16 16   Height:      Weight:      SpO2: 100%  96% 95%    Intake/Output Summary (Last 24 hours) at 11/02/12 1046 Last data filed at 11/02/12 0900  Gross per 24 hour  Intake 3457.92 ml  Output   5075 ml  Net -1617.08 ml   Filed Weights   10/29/12 2229 10/30/12 0733  Weight: 127.5 kg (281 lb 1.4 oz) 131.5 kg (289 lb 14.5 oz)     Exam:  General exam:  moderately built and morbidly obese male lying comfortably supine in bed.  Respiratory system: Clear. No increased work of breathing. Cardiovascular system: S1 & S2 heard, RRR. No JVD, murmurs, gallops, clicks. Trace chronic appearing 1+ right leg edema with chronic skin changes/hyperpigmentation.  Gastrointestinal system: Abdomen is nondistended, soft and non tender. Normal bowel sounds heard. Foley catheter +: no gross hematuria. Central nervous system: Alert and oriented. No focal neurological deficits. Extremities: Symmetric 5 x 5 power. Left BKA stump with compression  stocking.   Data Reviewed: Basic Metabolic Panel:  Recent Labs Lab 10/29/12 1642 10/30/12 0520 10/31/12 0335 11/01/12 0410 11/02/12 0451  NA 132* 134* 135 133* 134*  K 4.5 4.1 4.2 4.0 3.6  CL 102 108 109 109 106  CO2 18* 17* 18* 16* 21  GLUCOSE 185* 135* 163* 204* 166*  BUN 71* 65* 61* 47* 36*  CREATININE 5.09* 4.25* 3.62* 2.61* 2.09*  CALCIUM 8.2* 7.5* 7.8* 8.0* 8.2*   Liver Function Tests:  Recent Labs Lab 10/29/12 1642  AST 10  ALT 10  ALKPHOS 102  BILITOT 0.4  PROT 6.0  ALBUMIN 2.3*   No results found for this basename: LIPASE, AMYLASE,  in the last 168 hours No results found for this basename: AMMONIA,  in the last 168 hours CBC:  Recent Labs Lab 10/29/12 1642 10/30/12 0520 10/31/12 0335 11/01/12 0410 11/02/12 0451  WBC 5.6 4.8 4.3 4.3 4.3  NEUTROABS 4.3  --   --   --   --   HGB 7.6* 8.8* 8.8* 9.1* 8.8*  HCT 24.4* 27.7* 28.5* 29.0* 28.1*  MCV 84.4 83.4 83.6 82.6 81.7  PLT 187 153 165 179 164   Cardiac Enzymes:  Recent Labs Lab 10/29/12 2311 10/30/12 0520 10/30/12 1050  TROPONINI <0.30 <0.30 <0.30   BNP (last 3 results) No results found for this basename: PROBNP,  in the last 8760 hours CBG:  Recent Labs Lab  10/31/12 2135 11/01/12 0747 11/01/12 1215 11/01/12 1720 11/01/12 2157  GLUCAP 217* 151* 211* 190* 171*    Recent Results (from the past 240 hour(s))  SURGICAL PCR SCREEN     Status: None   Collection Time    10/24/12  9:15 AM      Result Value Range Status   MRSA, PCR NEGATIVE  NEGATIVE Final   Staphylococcus aureus NEGATIVE  NEGATIVE Final   Comment:            The Xpert SA Assay (FDA     approved for NASAL specimens     in patients over 76 years of age),     is one component of     a comprehensive surveillance     program.  Test performance has     been validated by [employer] for patients greater     than or equal to 63 year old.     It is not intended     to diagnose infection nor to     guide or monitor  treatment.  URINE CULTURE     Status: None   Collection Time    10/24/12 11:40 AM      Result Value Range Status   Specimen Description URINE, CLEAN CATCH   Final   Special Requests PATIENT ON FOLLOWING GENTAMICIN 650MG    Final   Culture  Setup Time     Final   Value: 10/24/2012 15:08     Performed at [place]     Final   Value: NO GROWTH     Performed at [facility]   Culture     Final   Value: NO GROWTH     Performed at [facility]   Report Status 10/25/2012 FINAL   Final  MRSA PCR SCREENING     Status: None   Collection Time    10/29/12 10:25 PM      Result Value Range Status   MRSA by PCR NEGATIVE  NEGATIVE Final   Comment:            The GeneXpert MRSA Assay (FDA     approved for NASAL specimens     only), is one component of a     comprehensive MRSA colonization     surveillance program. It is not     intended to diagnose MRSA     infection nor to guide or     monitor treatment for     MRSA infections.  CULTURE, BLOOD (ROUTINE X 2)     Status: None   Collection Time    10/29/12 11:05 PM      Result Value Range Status   Specimen Description BLOOD LEFT HAND   Final   Special Requests BOTTLES DRAWN AEROBIC ONLY 2CC   Final   Culture  Setup Time     Final   Value: 10/30/2012 05:21     Performed at [facility]   Culture     Final   Value:        BLOOD CULTURE RECEIVED NO GROWTH TO DATE CULTURE WILL BE HELD FOR 5 DAYS BEFORE ISSUING A FINAL NEGATIVE REPORT     Performed at [facility]   Report Status PENDING   Incomplete  CULTURE, BLOOD (ROUTINE X 2)     Status: None   Collection Time    10/29/12 11:11 PM      Result Value  Range Status   Specimen Description BLOOD RIGHT ANTECUBITAL   Final   Special Requests BOTTLES DRAWN AEROBIC AND ANAEROBIC 3CC   Final   Culture  Setup Time     Final   Value: 10/30/2012 05:21     Performed at [facility]   Culture     Final   Value:        BLOOD CULTURE  RECEIVED NO GROWTH TO DATE CULTURE WILL BE HELD FOR 5 DAYS BEFORE ISSUING A FINAL NEGATIVE REPORT     Performed at [facility]   Report Status PENDING   Incomplete  URINE CULTURE     Status: None   Collection Time    10/30/12 12:30 PM      Result Value Range Status   Specimen Description URINE, CATHETERIZED   Final   Special Requests NONE   Final   Culture  Setup Time     Final   Value: 10/30/2012 15:34     Performed at [place]     Final   Value: NO GROWTH     Performed at [facility]   Culture     Final   Value: NO GROWTH     Performed at [facility]   Report Status 10/31/2012 FINAL   Final      Additional labs: 1. Lipid panel: Cholesterol 100, TG 226, HDL 12, LDL 43 and VLDL 45. 2. Venous lactate on admission: 1.66 3. Random cortisol: 8.6      Studies: No results found.      Scheduled Meds: . atorvastatin  40 mg Oral QPM  . ciprofloxacin  500 mg Oral BID  . docusate sodium  100 mg Oral BID  . ferrous sulfate  325 mg Oral BID  . insulin aspart  0-5 Units Subcutaneous QHS  . insulin aspart  0-9 Units Subcutaneous TID WC  . insulin glargine  10 Units Subcutaneous QHS  . levothyroxine  200 mcg Oral q morning - 10a  . multivitamin with minerals  1 tablet Oral Daily  . senna  1 tablet Oral BID  . sodium chloride  3 mL Intravenous Q12H  . warfarin  4 mg Oral ONCE-1800  . Warfarin - Pharmacist Dosing Inpatient   Does not apply q1800   Continuous Infusions: .  sodium bicarbonate 150 mEq in sterile water 1000 mL infusion 100 mL/hr at 11/02/12 0422    Principal Problem:   Sepsis Active Problems:   DIABETES MELLITUS-TYPE II   HYPERLIPIDEMIA   OBESITY   Acute posthemorrhagic anemia   DVT   Metabolic acidosis   Pyelonephritis   UI (urinary incontinence)   Renal failure, acute on chronic    Time spent: 30 minutes     [facility] Pager [phone].   If 8PM-8AM, please contact  night-coverage at [email or web address], password [facility] 11/02/2012, 10:46 AM  LOS: 4 days

## 2012-11-02 NOTE — Progress Notes (Signed)
Patient ID: [name], male   DOB: 1955-10-20, 57 y.o.   MRN: [id number]    Subjective: Mr. Nickell reports pain with the foley but his Cr continues to decline with drainage and he has excellent UOP with about 5 liters out yesterday. ROS: Negative except for depression  Objective: Vital signs in last 24 hours: Temp:  [98 F (36.7 C)-98.7 F (37.1 C)] 98.7 F (37.1 C) (09/06 0616) Pulse Rate:  [80-82] 82 (09/06 0616) Resp:  [16] 16 (09/06 0616) BP: (110-133)/(69-71) 110/70 mmHg (09/06 0616) SpO2:  [95 %-100 %] 95 % (09/06 0616)  Intake/Output from previous day: 09/05 0701 - 09/06 0700 In: 3817.9 [P.O.:1200; I.V.:2617.9] Out: 5250 [Urine:5250] Intake/Output this shift:    General appearance: alert and no distress  Lab Results:   Recent Labs  11/01/12 0410 11/02/12 0451  WBC 4.3 4.3  HGB 9.1* 8.8*  HCT 29.0* 28.1*  PLT 179 164   BMET  Recent Labs  11/01/12 0410 11/02/12 0451  NA 133* 134*  K 4.0 3.6  CL 109 106  CO2 16* 21  GLUCOSE 204* 166*  BUN 47* 36*  CREATININE 2.61* 2.09*  CALCIUM 8.0* 8.2*   PT/INR  Recent Labs  11/01/12 0410 11/02/12 0451  LABPROT 22.8* 20.6*  INR 2.09* 1.83*   ABG No results found for this basename: PHART, PCO2, PO2, HCO3,  in the last 72 hours  Studies/Results: No results found.  Anti-infectives: Anti-infectives   Start     Dose/Rate Route Frequency Ordered Stop   11/01/12 1600  ciprofloxacin (CIPRO) tablet 500 mg     500 mg Oral 2 times daily 11/01/12 1443     10/29/12 2300  ceFEPIme (MAXIPIME) 2 g in dextrose 5 % 50 mL IVPB  Status:  Discontinued     2 g 100 mL/hr over 30 Minutes Intravenous Daily at bedtime 10/29/12 2148 11/01/12 1445   10/29/12 2130  cefTRIAXone (ROCEPHIN) 1 g in dextrose 5 % 50 mL IVPB  Status:  Discontinued     1 g 100 mL/hr over 30 Minutes Intravenous Every 24 hours 10/29/12 2117 10/29/12 2131      Current Facility-Administered Medications  Medication Dose Route Frequency Provider Last  Rate Last Dose  . acetaminophen (TYLENOL) tablet 650 mg  650 mg Oral Q6H PRN Janece Canterbury, MD       Or  . acetaminophen (TYLENOL) suppository 650 mg  650 mg Rectal Q6H PRN Janece Canterbury, MD      . atorvastatin (LIPITOR) tablet 40 mg  40 mg Oral QPM Janece Canterbury, MD   40 mg at 11/01/12 1750  . bisacodyl (DULCOLAX) EC tablet 5 mg  5 mg Oral Daily PRN Janece Canterbury, MD      . ciprofloxacin (CIPRO) tablet 500 mg  500 mg Oral BID Modena Jansky, MD   500 mg at 11/01/12 1830  . docusate sodium (COLACE) capsule 100 mg  100 mg Oral BID Janece Canterbury, MD   100 mg at 11/01/12 1240  . ferrous sulfate tablet 325 mg  325 mg Oral BID Janece Canterbury, MD   325 mg at 11/01/12 2245  . HYDROcodone-acetaminophen (NORCO/VICODIN) 5-325 MG per tablet 1 tablet  1 tablet Oral Q4H PRN Dianne Dun, NP   1 tablet at 11/02/12 0615  . insulin aspart (novoLOG) injection 0-5 Units  0-5 Units Subcutaneous QHS Janece Canterbury, MD   2 Units at 10/31/12 2251  . insulin aspart (novoLOG) injection 0-9 Units  0-9 Units Subcutaneous TID WC Noah Delaine  Short, MD   2 Units at 11/01/12 1749  . insulin glargine (LANTUS) injection 10 Units  10 Units Subcutaneous QHS Janece Canterbury, MD   10 Units at 11/01/12 2245  . levothyroxine (SYNTHROID, LEVOTHROID) tablet 200 mcg  200 mcg Oral q morning - 10a Janece Canterbury, MD   200 mcg at 11/01/12 0849  . lidocaine (XYLOCAINE) 2 % jelly   Topical Q8H PRN Janece Canterbury, MD      . multivitamin with minerals tablet 1 tablet  1 tablet Oral Daily Janece Canterbury, MD   1 tablet at 11/01/12 1240  . ondansetron (ZOFRAN) tablet 4 mg  4 mg Oral Q6H PRN Janece Canterbury, MD       Or  . ondansetron [facility]) injection 4 mg  4 mg Intravenous Q6H PRN Janece Canterbury, MD      . phenazopyridine (PYRIDIUM) tablet 100 mg  100 mg Oral TID PRN Modena Jansky, MD   100 mg at 11/02/12 0615  . polyethylene glycol (MIRALAX / GLYCOLAX) packet 17 g  17 g Oral Daily PRN Janece Canterbury, MD      .  senna (SENOKOT) tablet 8.6 mg  1 tablet Oral BID Janece Canterbury, MD   8.6 mg at 11/01/12 2244  . sodium bicarbonate 150 mEq in sterile water 1,000 mL infusion   Intravenous Continuous Modena Jansky, MD 100 mL/hr at 11/02/12 0422    . sodium chloride 0.9 % injection 3 mL  3 mL Intravenous Q12H Janece Canterbury, MD   3 mL at 10/29/12 2256  . traMADol (ULTRAM) tablet 50 mg  50 mg Oral Q6H PRN Modena Jansky, MD   50 mg at 11/02/12 0256  . Warfarin - Pharmacist Dosing Inpatient   Does not apply q1800 Mosetta Pigeon, [facility]        Assessment: Foley is draining well with a decline in his Cr.   His Hgb is slightly lower.    Plan: Continue foley drainage for now.      LOS: 4 days    Oluwaferanmi Wain J 11/02/2012

## 2012-11-03 DIAGNOSIS — N12 Tubulo-interstitial nephritis, not specified as acute or chronic: Secondary | ICD-10-CM | POA: Diagnosis not present

## 2012-11-03 DIAGNOSIS — E119 Type 2 diabetes mellitus without complications: Secondary | ICD-10-CM

## 2012-11-03 DIAGNOSIS — N179 Acute kidney failure, unspecified: Secondary | ICD-10-CM | POA: Diagnosis not present

## 2012-11-03 DIAGNOSIS — D649 Anemia, unspecified: Secondary | ICD-10-CM | POA: Diagnosis not present

## 2012-11-03 LAB — GLUCOSE, CAPILLARY: Glucose-Capillary: 184 mg/dL — ABNORMAL HIGH (ref 70–99)

## 2012-11-03 LAB — BASIC METABOLIC PANEL
BUN: 29 mg/dL — ABNORMAL HIGH (ref 6–23)
Chloride: 105 mEq/L (ref 96–112)
Creatinine, Ser: 1.81 mg/dL — ABNORMAL HIGH (ref 0.50–1.35)
GFR calc Af Amer: 47 mL/min — ABNORMAL LOW (ref >90)
Glucose, Bld: 175 mg/dL — ABNORMAL HIGH (ref 70–99)

## 2012-11-03 MED ORDER — WARFARIN SODIUM 3 MG PO TABS
3.0000 mg | ORAL_TABLET | Freq: Once | ORAL | Status: AC
  Administered 2012-11-03: 3 mg via ORAL
  Filled 2012-11-03: qty 1

## 2012-11-03 NOTE — Progress Notes (Signed)
Physical Therapy Treatment Patient Details Name: [name] MRN: [id number] DOB: Jul 01, 1955 Today's Date: 11/03/2012 Time: [id number] PT Time Calculation (min): 26 min  PT Assessment / Plan / Recommendation  History of Present Illness The patient is a 57 y.o. 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  He underwent bladder biopsy and stretching on 8/28 by [name].  Post-operatively, he had some hematuria which looked like gross blood for the last several days and which prompted his home health RN to have him brought to the hospital today.  Currently, the patient tells me he feels fine.  He denies lightheadedness, chest pain, SOB, nausea, vomiting, poor appetite, dehydration, diarrhea.  He had a firm BM overnight.  He has some suprapubic pain which has been present since his procedure.  Denies fevers and chills.  In contrast, when I spoke with his wife on the phone, she states that he has had some fatigue and lightheadedness, particularly today.  He told her he was feeling feverish earlier today and that he had coughed up two small blood clots.     PT Comments   Pt required MAX encouragement to participate.   Esp upset because today was Sunday and he had planned on doing nothing and watch his programs.  Pt finally did agree after discussion of D/C to home vs SNF (pt ref).  With a sliding board and bed pad, pt did scoot from bed to recliner.     Follow Up Recommendations  SNF(pt declining)  HH PT   Does the patient have the potential to tolerate intense rehabilitation     Barriers to Discharge        Equipment Recommendations  None recommended by PT    Recommendations for Other Services    Frequency     Progress towards PT Goals Progress towards PT goals:  Progressing toward goals  Plan Current plan remains appropriate    Precautions / Restrictions Precautions Precautions: Fall Precaution Comments: L BKA Restrictions Weight Bearing Restrictions: No    Pertinent Vitals/Pain C/o scrotal discomfort     Mobility  Bed Mobility Bed Mobility: Supine to Sit;Sit to Supine Supine to Sit: 4: Min assist Details for Bed Mobility Assistance: Flat bed pt required increased time esp with supine to sit due to BMI.  Pt required nearly 4 min to complete task.  Transfers Transfers: Lateral/Scoot Transfers (sliding board) Lateral/Scoot Transfers: With slide board;3: Mod assist Details for Transfer Assistance: Max for set up sliding board and furniture such that pt was scooting to his R.  Utilized bed pad to assist with scooting on board.   Ambulation/Gait Ambulation/Gait Assistance Details: Pt non amb    PT Goals (current goals can now be found in the care plan section)    Visit Information  Last PT Received On: 11/03/12 History of Present Illness: The patient is a 57 y.o. 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  He underwent bladder biopsy and stretching on 8/28 by [name].  Post-operatively, he had some hematuria which looked like gross blood for the last several days and which prompted  his home health RN to have him brought to the hospital today.  Currently, the patient tells me he feels fine.  He denies lightheadedness, chest pain, SOB, nausea, vomiting, poor appetite, dehydration, diarrhea.  He had a firm BM overnight.  He has some suprapubic pain which has been present since his procedure.  Denies fevers and chills.  In contrast, when I spoke with his wife on the phone, she states that he has had some fatigue and  lightheadedness, particularly today.  He told her he was feeling feverish earlier today and that he had coughed up two small blood clots.      Subjective Data      Cognition       Balance     End of Session PT - End of Session Equipment Utilized During Treatment: Gait belt Activity Tolerance: Patient limited by fatigue;Patient limited by pain Patient left: in chair;with call bell/phone within reach   [name]  PTA [facility]  Acute  Rehab Pager      [phone]

## 2012-11-03 NOTE — Progress Notes (Signed)
Pt refused for lab staff to stick him this morning for blood draw.

## 2012-11-03 NOTE — Progress Notes (Signed)
ANTICOAGULATION CONSULT NOTE - Initial Consult  Pharmacy Consult for Coumadin Indication: Hx of Bilateral PE 10 years ago, and remote hx of DVT  No Known Allergies  Patient Measurements: Height: 5\' 11"  (180.3 cm) Weight: 289 lb 14.5 oz (131.5 kg) IBW/kg (Calculated) : 75.3  Vital Signs: Temp: 98.8 F (37.1 C) (09/07 0628) Temp src: Oral (09/07 0628) BP: 131/95 mmHg (09/07 0628) Pulse Rate: 89 (09/07 0628)  Labs:  Recent Labs  11/01/12 0410 11/02/12 0451 11/03/12 0748  HGB 9.1* 8.8*  --   HCT 29.0* 28.1*  --   PLT 179 164  --   LABPROT 22.8* 20.6* 21.0*  INR 2.09* 1.83* 1.87*  CREATININE 2.61* 2.09*  --     Estimated Creatinine Clearance: 55.9 ml/min (by C-G formula based on Cr of 2.09).    Assessment:  38 yoM on chronic anticoagulation for hx of bilateral PE ~ 10 years ago and hx of DVT.  Pt admitted 9/2 with urinary retention and hematuria s/p cystoscopy, bilateral retrograde ureteropyelogram, as well as hydrodistention and bladder biopsy on 8/28.  Hematuria now resolved and warfarin resumed on 9/5.   Unclear why INR was high on admission given patient supposedly held warfarin for procedure (wife reported last dose given PTA on 8/23), possible due to cipro use PTA?Marland Kitchen  Prior to admission dose reported as 3 mg daily  CBC okay yesterday, patient refusing Lab sticks this AM but we did obtain an INR of 1.87, it is below goal but trending toward goal quickly considering we held his warfarin x 3 days.  No bleeding reported today  Drug Interaction with Cipro, can cause INR to increase  Goal of Therapy:  INR 2-3 Monitor platelets by anticoagulation protocol: Yes   Plan:  - Coumadin 3 mg PO x1 tonight - dosing cautiously given recent hematuria, DI with Cipro, and INR high on admission.  - Daily PT/INR, Monitor CBC and s/sx bleeding  - Monitor Drug interaction w/ Cipro, can cause INR to increase   Katrianna Friesenhahn, Gaye Alken PharmD Pager #: [phone] 8:32  AM 11/03/2012

## 2012-11-03 NOTE — Progress Notes (Addendum)
Physical Therapy Treatment Patient Details Name: [name] MRN: [id number] DOB: 1955/10/18 Today's Date: 11/03/2012 Time:11:55 - 12:11  PT Time Calculation (min): 16 min  PT Assessment / Plan / Recommendation  History of Present Illness The patient is a 57 y.o. 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  He underwent bladder biopsy and stretching on 8/28 by Dr. Diona [name].  Post-operatively, he had some hematuria which looked like gross blood for the last several days and which prompted his home health RN to have him brought to the hospital today.  Currently, the patient tells me he feels fine.  He denies lightheadedness, chest pain, SOB, nausea, vomiting, poor appetite, dehydration, diarrhea.  He had a firm BM overnight.  He has some suprapubic pain which has been present since his procedure.  Denies fevers and chills.  In contrast, when I spoke with his wife on the phone, she states that he has had some fatigue and lightheadedness, particularly today.  He told her he was feeling feverish earlier today and that he had coughed up two small blood clots.     PT Comments   Assisted pt back to bed via sliding board lateral scoot to his R.  Follow Up Recommendations  SNF     Does the patient have the potential to tolerate intense rehabilitation     Barriers to Discharge        Equipment Recommendations  None recommended by PT    Recommendations for Other Services    Frequency     Progress towards PT Goals Progress towards PT goals: Progressing toward goals  Plan Current plan remains appropriate    Precautions / Restrictions Precautions Precautions: Fall Precaution Comments: L BKA Restrictions Weight Bearing Restrictions: No    Pertinent Vitals/Pain C/o  scotal pain (from cath states pt)    Mobility  Bed Mobility Bed Mobility: Sit to supine  Min assist Transfers Transfers: Lateral/Scoot Transfers (sliding board) Lateral/Scoot Transfers: With slide board;3: Mod assist Details for Transfer Assistance: Max for set up sliding board and furniture such that pt was scooting to his R.  Utilized bed pad to assist with scooting on board.   Ambulation/Gait Ambulation/Gait Assistance Details: Pt non amb    PT Goals (current goals can now be found in the care plan section)    Visit Information  Last PT Received On: 11/03/12 Assistance Needed: +2 History of Present Illness: The patient is a 57 y.o. 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  He underwent bladder biopsy and stretching on 8/28 by Dr. Diona [name].  Post-operatively, he had some hematuria which looked like gross blood for the last several days and which prompted his home health RN to have him brought to the hospital today.  Currently, the patient tells me he feels fine.  He denies lightheadedness, chest pain, SOB, nausea, vomiting, poor appetite, dehydration, diarrhea.  He had a firm BM overnight.  He has some suprapubic pain which has been present since his procedure.  Denies fevers and chills.  In contrast, when I spoke with his wife on the phone, she states  that he has had some fatigue and lightheadedness, particularly today.  He told her he was feeling feverish earlier today and that he had coughed up two small blood clots.      Subjective Data      Cognition       Balance     End of Session PT - End of Session Equipment Utilized During Treatment: Gait belt Activity Tolerance: Patient limited by fatigue;Patient limited by pain Patient left: in bed;with  call bell/phone within reach   [name]  PTA [name]  Acute  Rehab Pager      [phone]

## 2012-11-03 NOTE — Progress Notes (Signed)
[facility] PROGRESS NOTE  SHU FAZZINO [id number] DOB: 06-19-1955 DOA: 10/29/2012 PCP: No primary provider on file.  HPI/Brief narrative 57 y.o. 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 He underwent bladder biopsy and stretching on 8/28 by [name]. Post-operatively, he had some hematuria which looked like gross blood for the last several days and which prompted his home health RN to have him brought to the hospital. He also complained of fatigue, lightheadedness and feeling feverish. He specifically denied coughing blood at any time, or dyspnea or chest pain. In the ED, patient was hypotensive in the 60s/40s, Labs were notable for Na 132, bicarb 18, BUN 71, Cr 5.09 (baseline 2), albumin 2.3, WBC 5.6, hgb 7.6 (baseline 10mg /dl), plt 187, UA +, INR 3.06 & CT abd/pelvis without contrast demonstrated numerous nonobstructive calculi within the collecting systems of the kidneys bilaterally without ureteral stones and suggestion of distal ureteral strictures right greater than left and/or possible recurrent upper UTI. Critical care was consulted by EDP and recommended hospitalist admit. Admitted to step down unit and aggressively hydrated, transfuse 2 units of PRBCs and treated with broad-spectrum IV antibiotics. Urology consulted. Patient's renal functions continued to improve.   Assessment/Plan:  Sepsis, present on admission/acute pyelonephritis - Possibly secondary to acute pyelonephritis. - Unfortunately urine culture was sent after patient had been started on antibiotics. - Patient was empirically treated with IV cefepime. Switched to oral Cipro on 9/5 to complete a total 10 days treatment. - Blood culture x2 negative to date. Urine culture negative. -  Was aggressively hydrated since admission. Blood pressures have stabilized. - Sepsis resolved.  Hypotension -DD: Sepsis, dehydration, anemia. - Improved. Continue antibiotics and IVF.  - Random cortisol 8.6. - Resolved.  Acute on stage III chronic kidney disease - Baseline creatinine: ? 1.7- 1.9 - DD: Dehydration, hypotension/ATN/ hydroureters - FeNA: 5.9 - Improving. Continue IV fluids and Foley catheter. 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 ideal conduit-referral to tertiary care center will be arranged by urology as outpatient. - Creatinine almost at baseline. DC IVF and follow BMP in am. - Acute renal failure resolved.  Acute on chronic anemia - DD: Hematuria, critical illness - Status post 2 units PRBCs. Improved & stable - Coumadin was on hold and will be resumed on 9/5. - Continue ferrous sulfate - Stable  DVT/PE on chronic Coumadin - Coumadin was on hold since sometime prior to urological procedure on 8/28. - Was held in hospital initially due to hematuria and anemia. - Gross hematuria has almost resolved. - Coumadin resumed on 9/5. - Not great candidate for Xarelto due to fluctuating renal functions, and may need further Urological procedures on DC. - INR 1.87  Non-anion gap metabolic acidosis - Possibly secondary to a AKI. - Resolved. DCed Bicarbonate drip.  Type II DM - Hemoglobin A1c 4.9 less than 3 months ago suggesting very tight control. - Lantus reduced to 10 units daily and continue SSI. Monitor closely. - CBGs rising, may need to titrate insulins.  Constipation  -Likely narcotic induced. Continue bowel regimen.   Hypothyroid  -Continue Synthroid.      PVD/chronic RLE venous stasis & dermatitis/left BKA  - Left BKA stump is healed.  Hematuria/bilateral hydroureter/recent urology procedure - Gross hematuria has resolved.  Continue Foley catheter. -  Please see urology recommendations 9/4  ? Hemoptysis - Apparently reported on admission but patient denies ever having.  OSA on nightly CPAP - Patient declined CPAP overnight.  DVT prophylaxis:  started Coumadin 9/5. Use Heparin/Lovenox if INR < 1.8 Lines/catheters:  PIV/Foley Activity: Up with assistance. PT and OT ordered.  Nutrition:  Diabetic diet   Code Status:  full Family Communication:  Left message for spouse on 9/6-haven't heard back. Disposition Plan: Patient declines SNF. Home when medically stable, possibly 9/8 when able to arrange definite OP Coumadin follow up.  Consultants:  Urology  Procedures:   Foley   Antibiotics:  IV cefepime 9/2 > 9/5  Oral Cipro 9/5 >   Subjective: No other new complaints.  Objective: Filed Vitals:   11/02/12 0616 11/02/12 1340 11/02/12 2226 11/03/12 0628  BP: 110/70 112/72 143/84 131/95  Pulse: 82 86 86 89  Temp: 98.7 F (37.1 C) 98.2 F (36.8 C) 98.9 F (37.2 C) 98.8 F (37.1 C)  TempSrc: Oral Oral Oral Oral  Resp: 16 18 18 18   Height:      Weight:      SpO2: 95% 98% 96% 92%    Intake/Output Summary (Last 24 hours) at 11/03/12 1027 Last data filed at 11/03/12 0916  Gross per 24 hour  Intake   1110 ml  Output   3550 ml  Net  -2440 ml   Filed Weights   10/29/12 2229 10/30/12 0733  Weight: 127.5 kg (281 lb 1.4 oz) 131.5 kg (289 lb 14.5 oz)     Exam:  General exam:  moderately built and morbidly obese male lying comfortably supine in bed.  Respiratory system: Clear. No increased work of breathing. Cardiovascular system: S1 & S2 heard, RRR. No JVD, murmurs, gallops, clicks. Trace chronic appearing 1+ right leg edema with chronic skin changes/hyperpigmentation.  Gastrointestinal system: Abdomen is nondistended, soft and non tender. Normal bowel sounds heard. Foley catheter +: no gross hematuria. Central nervous system: Alert and oriented. No focal neurological deficits. Extremities:  Symmetric 5 x 5 power. Left BKA stump with compression stocking.   Data Reviewed: Basic Metabolic Panel:  Recent Labs Lab 10/30/12 0520 10/31/12 0335 11/01/12 0410 11/02/12 0451 11/03/12 0748  NA 134* 135 133* 134* 136  K 4.1 4.2 4.0 3.6 3.7  CL 108 109 109 106 105  CO2 17* 18* 16* 21 24  GLUCOSE 135* 163* 204* 166* 175*  BUN 65* 61* 47* 36* 29*  CREATININE 4.25* 3.62* 2.61* 2.09* 1.81*  CALCIUM 7.5* 7.8* 8.0* 8.2* 8.2*   Liver Function Tests:  Recent Labs Lab 10/29/12 1642  AST 10  ALT 10  ALKPHOS 102  BILITOT 0.4  PROT 6.0  ALBUMIN 2.3*   No results found for this basename: LIPASE, AMYLASE,  in the last 168 hours No results found for this basename: AMMONIA,  in the last 168 hours CBC:  Recent Labs Lab 10/29/12 1642 10/30/12 0520 10/31/12 0335 11/01/12 0410 11/02/12 0451  WBC 5.6 4.8 4.3 4.3 4.3  NEUTROABS 4.3  --   --   --   --   HGB 7.6* 8.8* 8.8* 9.1* 8.8*  HCT 24.4* 27.7* 28.5* 29.0* 28.1*  MCV 84.4 83.4 83.6 82.6 81.7  PLT 187 153 165 179 164   Cardiac Enzymes:  Recent Labs Lab 10/29/12 2311 10/30/12 0520 10/30/12 1050  TROPONINI <0.30 <0.30 <0.30   BNP (last 3 results) No results found for  this basename: PROBNP,  in the last 8760 hours CBG:  Recent Labs Lab 11/01/12 2157 11/02/12 1207 11/02/12 1728 11/02/12 2224 11/03/12 0733  GLUCAP 171* 200* 199* 252* 176*    Recent Results (from the past 240 hour(s))  URINE CULTURE     Status: None   Collection Time    10/24/12 11:40 AM      Result Value Range Status   Specimen Description URINE, CLEAN CATCH   Final   Special Requests PATIENT ON FOLLOWING GENTAMICIN 650MG    Final   Culture  Setup Time     Final   Value: 10/24/2012 15:08     Performed at [place]     Final   Value: NO GROWTH     Performed at [facility]   Culture     Final   Value: NO GROWTH     Performed at [facility]   Report Status 10/25/2012 FINAL   Final  MRSA PCR  SCREENING     Status: None   Collection Time    10/29/12 10:25 PM      Result Value Range Status   MRSA by PCR NEGATIVE  NEGATIVE Final   Comment:            The GeneXpert MRSA Assay (FDA     approved for NASAL specimens     only), is one component of a     comprehensive MRSA colonization     surveillance program. It is not     intended to diagnose MRSA     infection nor to guide or     monitor treatment for     MRSA infections.  CULTURE, BLOOD (ROUTINE X 2)     Status: None   Collection Time    10/29/12 11:05 PM      Result Value Range Status   Specimen Description BLOOD LEFT HAND   Final   Special Requests BOTTLES DRAWN AEROBIC ONLY 2CC   Final   Culture  Setup Time     Final   Value: 10/30/2012 05:21     Performed at [facility]   Culture     Final   Value:        BLOOD CULTURE RECEIVED NO GROWTH TO DATE CULTURE WILL BE HELD FOR 5 DAYS BEFORE ISSUING A FINAL NEGATIVE REPORT     Performed at [facility]   Report Status PENDING   Incomplete  CULTURE, BLOOD (ROUTINE X 2)     Status: None   Collection Time    10/29/12 11:11 PM      Result Value Range Status   Specimen Description BLOOD RIGHT ANTECUBITAL   Final   Special Requests BOTTLES DRAWN AEROBIC AND ANAEROBIC 3CC   Final   Culture  Setup Time     Final   Value: 10/30/2012 05:21     Performed at [facility]   Culture     Final   Value:        BLOOD CULTURE RECEIVED NO GROWTH TO DATE CULTURE WILL BE HELD FOR 5 DAYS BEFORE ISSUING A FINAL NEGATIVE REPORT     Performed at [facility]   Report Status PENDING   Incomplete  URINE CULTURE     Status: None   Collection Time    10/30/12 12:30 PM      Result Value Range Status   Specimen Description URINE, CATHETERIZED   Final   Special Requests  NONE   Final   Culture  Setup Time     Final   Value: 10/30/2012 15:34     Performed at [place]     Final   Value: NO GROWTH     Performed at [facility]    Culture     Final   Value: NO GROWTH     Performed at [facility]   Report Status 10/31/2012 FINAL   Final      Additional labs: 1. Lipid panel: Cholesterol 100, TG 226, HDL 12, LDL 43 and VLDL 45. 2. Venous lactate on admission: 1.66 3. Random cortisol: 8.6      Studies: No results found.      Scheduled Meds: . atorvastatin  40 mg Oral QPM  . ciprofloxacin  500 mg Oral BID  . docusate sodium  100 mg Oral BID  . ferrous sulfate  325 mg Oral BID  . insulin aspart  0-5 Units Subcutaneous QHS  . insulin aspart  0-9 Units Subcutaneous TID WC  . insulin glargine  10 Units Subcutaneous QHS  . levothyroxine  200 mcg Oral q morning - 10a  . multivitamin with minerals  1 tablet Oral Daily  . senna  1 tablet Oral BID  . sodium chloride  3 mL Intravenous Q12H  . warfarin  3 mg Oral ONCE-1800  . Warfarin - Pharmacist Dosing Inpatient   Does not apply q1800   Continuous Infusions:    Principal Problem:   Sepsis Active Problems:   DIABETES MELLITUS-TYPE II   HYPERLIPIDEMIA   OBESITY   Acute posthemorrhagic anemia   DVT   Metabolic acidosis   Pyelonephritis   UI (urinary incontinence)   Renal failure, acute on chronic    Time spent: 30 minutes     [facility] Pager [phone].   If 8PM-8AM, please contact night-coverage at [email or web address], password [facility] 11/03/2012, 10:27 AM  LOS: 5 days

## 2012-11-04 DIAGNOSIS — N12 Tubulo-interstitial nephritis, not specified as acute or chronic: Secondary | ICD-10-CM | POA: Diagnosis not present

## 2012-11-04 DIAGNOSIS — N179 Acute kidney failure, unspecified: Secondary | ICD-10-CM | POA: Diagnosis not present

## 2012-11-04 DIAGNOSIS — A419 Sepsis, unspecified organism: Secondary | ICD-10-CM | POA: Diagnosis not present

## 2012-11-04 DIAGNOSIS — D62 Acute posthemorrhagic anemia: Secondary | ICD-10-CM | POA: Diagnosis not present

## 2012-11-04 LAB — PROTIME-INR
INR: 2.08 — ABNORMAL HIGH (ref 0.00–1.49)
Prothrombin Time: 22.7 seconds — ABNORMAL HIGH (ref 11.6–15.2)

## 2012-11-04 MED ORDER — WARFARIN SODIUM 2 MG PO TABS
2.0000 mg | ORAL_TABLET | Freq: Once | ORAL | Status: DC
  Filled 2012-11-04: qty 1

## 2012-11-04 MED ORDER — CIPROFLOXACIN HCL 500 MG PO TABS: 500.0000 mg | ORAL_TABLET | Freq: Two times a day (BID) | ORAL | Status: DC

## 2012-11-04 MED ORDER — WARFARIN SODIUM 2 MG PO TABS: 2.0000 mg | ORAL_TABLET | Freq: Every day | ORAL | Status: DC

## 2012-11-04 MED ORDER — INSULIN ASPART 100 UNIT/ML ~~LOC~~ SOLN: 3.0000 [IU] | Freq: Three times a day (TID) | SUBCUTANEOUS | Status: DC

## 2012-11-04 MED ORDER — DSS 100 MG PO CAPS: 100.0000 mg | ORAL_CAPSULE | Freq: Two times a day (BID) | ORAL | Status: DC

## 2012-11-04 MED ORDER — INSULIN GLARGINE 100 UNIT/ML ~~LOC~~ SOLN: 15.0000 [IU] | Freq: Every day | SUBCUTANEOUS | Status: DC

## 2012-11-04 NOTE — Progress Notes (Signed)
ANTICOAGULATION CONSULT NOTE - Follow Up  Pharmacy Consult for Coumadin Indication: Hx of Bilateral PE 10 years ago, and remote hx of DVT  No Known Allergies  Patient Measurements: Height: 5\' 11"  (180.3 cm) Weight:  (Patient asleep) IBW/kg (Calculated) : 75.3  Vital Signs: Temp: 99 F (37.2 C) (09/08 0427) Temp src: Oral (09/08 0427) BP: 121/75 mmHg (09/08 0427) Pulse Rate: 83 (09/08 0427)  Labs:  Recent Labs  11/02/12 0451 11/03/12 0748 11/04/12 0444  HGB 8.8*  --   --   HCT 28.1*  --   --   PLT 164  --   --   LABPROT 20.6* 21.0* 22.7*  INR 1.83* 1.87* 2.08*  CREATININE 2.09* 1.81*  --     Estimated Creatinine Clearance: 64.5 ml/min (by C-G formula based on Cr of 1.81).    Assessment:  83 yoM on chronic anticoagulation for hx of bilateral PE ~ 10 years ago and hx of DVT.  Pt admitted 9/2 with urinary retention and hematuria s/p cystoscopy, bilateral retrograde ureteropyelogram, as well as hydrodistention and bladder biopsy on 8/28.  Hematuria now resolved and warfarin resumed on 9/5.   Unclear why INR was high on admission given patient supposedly held warfarin for procedure (wife reported last dose given PTA on 8/23), possible due to cipro use PTA?Marland Kitchen  Prior to admission dose reported as 3 mg daily  Potential drug interaction with Cipro, can cause INR to increase  INR now therapeutic (rose from 1.87 to 2.08) - doses last 3 days of 5mg , 4mg , 3mg   No reported bleeding per notes  Goal of Therapy:  INR 2-3 Monitor platelets by anticoagulation protocol: Yes   Plan:  1) Plan per Md to discharge patient today. At this point, would recommend discharge warfarin dose of 2mg  daily 2) Recommend close INR followup since will also be on concurrent Cipro for a few more days after discharge.  3) To complete 10 days total of abx's. Abx's started on 9/2 so would continue Cipro thru 9/11   Adrian Saran, PharmD, BCPS Pager [phone] 11/04/2012 8:48 AM

## 2012-11-04 NOTE — Progress Notes (Signed)
OT Cancellation Note  Patient Details Name: [name] MRN: [id number] DOB: [birth year]   Cancelled Treatment:    Reason Eval/Treat Not Completed: Other (comment) Attempted tx. Pt eating breakfast and stating he "isnt awake yet" to participate in dressing/ADL task. Will try back as schedule permits.  Jules Schick [id number] 11/04/2012, 9:57 AM

## 2012-11-04 NOTE — Discharge Summary (Signed)
Physician Discharge Summary  [name] [id number] DOB: 04-19-1955 DOA: 10/29/2012  PCP: No primary provider on file.  Admit date: 10/29/2012 Discharge date: 11/04/2012  Time spent: Greater than 30 minutes  Recommendations for Outpatient Follow-up:  1. Leeanne Rio, PA-C on 11/11/12 at 1:30 PM. 2. Dr. Stephan Minister, Urology 3. Home Health RN to draw labs (CBC, PT & INR) on 11/06/12 and forward results to PCP for Coumadin dose adjustment and further instructions. 4. Home health PT & OT 5. Patient will DC with indwelling Foley catheter. 6. Followup final blood culture results that percent in the hospital on 10/29/12. 7. Followup BMP in 1-2 weeks time.  Discharge Diagnoses:  Principal Problem:   Sepsis Active Problems:   DIABETES MELLITUS-TYPE II   HYPERLIPIDEMIA   OBESITY   Acute posthemorrhagic anemia   DVT   Metabolic acidosis   Pyelonephritis   UI (urinary incontinence)   Renal failure, acute on chronic   Discharge Condition: Improved & Stable  Diet recommendation: Heart healthy and diabetic diet.  Filed Weights   10/29/12 2229 10/30/12 0733  Weight: 127.5 kg (281 lb 1.4 oz) 131.5 kg (289 lb 14.5 oz)    History of present illness:  57 y.o. 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 He underwent bladder biopsy and stretching on 8/28 by [name]. Post-operatively, he had some hematuria which looked like gross blood for the last several days and which prompted his home health RN to have him brought to the hospital. He also complained of fatigue, lightheadedness and feeling feverish. He specifically denied coughing blood at any time, or dyspnea or chest pain. In the ED, patient was hypotensive in the 60s/40s, Labs were notable for Na  132, bicarb 18, BUN 71, Cr 5.09 (baseline 2), albumin 2.3, WBC 5.6, hgb 7.6 (baseline 10mg /dl), plt 187, UA +, INR 3.06 & CT abd/pelvis without contrast demonstrated numerous nonobstructive calculi within the collecting systems of the kidneys bilaterally without ureteral stones and suggestion of distal ureteral strictures right greater than left and/or possible recurrent upper UTI. Critical care was consulted by EDP and recommended hospitalist admit.  Hospital Course:   Sepsis, present on admission/acute pyelonephritis  - Possibly secondary to acute pyelonephritis.  - Unfortunately urine culture was sent after patient had been started on antibiotics.  - Patient was empirically treated with IV cefepime. Switched to oral Cipro on 9/5 to complete a total 10 days treatment on 11/07/12.  - Blood culture x2 negative to date. Urine culture negative.  - Was aggressively hydrated since admission.   - Sepsis resolved.  - Patient apparently was recently discharged from SNF and lives with his wife. PT and OT evaluated and recommended SNF which patient repeatedly declined. Patients spouse apparently works and is not at home all the time. Unable to reach spouse-calls made. Advised patient that it might not be safe for him to return home in current situation. Patient however declines SNF and he does have capacity to make medical decisions for himself.  Hypotension  -DD: Sepsis, dehydration, anemia.  - Random cortisol 8.6.  - Resolved.   Acute on stage III chronic kidney disease  - Baseline creatinine: ? 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 ideal conduit-referral to tertiary care center will be arranged by urology as outpatient.  - Creatinine returned to baseline. Was treated with IV fluids. - Acute renal failure  resolved.  - Patient will discharge with Foley catheter.  Acute on chronic anemia  - DD: Hematuria, critical illness  - Status post 2 units PRBCs.  - Coumadin was on hold and resumed on 9/5.  - Continue ferrous sulfate  - Stable   DVT/PE on chronic Coumadin  - Coumadin was on hold since sometime prior to urological procedure on 8/28.  - Was held in hospital initially due to hematuria and anemia.  - Gross hematuria resolved.  - Coumadin resumed on 9/5.  - Not great candidate for Xarelto due to fluctuating renal functions, and may need further Urological procedures on DC.  - INR 2.08  Non-anion gap metabolic acidosis  - Possibly secondary to a AKI.  - Resolved after Bicarbonate drip.   Type II DM  - Hemoglobin A1c 4.9 less than 3 months ago suggesting very tight control.  - Patient was placed on reduced dose of Lantus 10 units daily & SSI in the hospital with reasonable control. - CBGs rising, and we'll increase Lantus to 10 units each bedtime along with NovoLog 3 units 3 times a day with meals, on discharge.  Constipation  -Likely narcotic induced. Continue bowel regimen.   Hypothyroid  -Continue Synthroid.   PVD/chronic RLE venous stasis & dermatitis/left BKA  - Left BKA stump is healed.   Hematuria/bilateral hydroureter/recent urology procedure  - Gross hematuria has resolved. Continue Foley catheter.  - Please see urology recommendations 9/4   ? Hemoptysis  - Apparently reported on admission but patient denies ever having.   OSA on nightly CPAP  - Patient declined CPAP overnight.   Consultants:  Urology  Procedures:  Foley     Discharge Exam:  Complaints:  Denies any specific complaints. Wanted to be left alone to sleep this morning.  Filed Vitals:   11/03/12 1510 11/03/12 2042 11/04/12 0427 11/04/12 1315  BP: 118/66 119/71 121/75 119/77  Pulse: 80 86 83 72  Temp: 98.2 F (36.8 C) 98.3 F (36.8 C) 99 F (37.2 C) 98.3 F (36.8 C)  TempSrc: Oral  Oral Oral Oral  Resp: 20 20 20 18   Height:      Weight:      SpO2: 98% 93% 94% 95%    General exam: moderately built and morbidly obese male lying comfortably supine in bed.  Respiratory system: Clear. No increased work of breathing.  Cardiovascular system: S1 & S2 heard, RRR. No JVD, murmurs, gallops, clicks. Trace chronic appearing 1+ right leg edema with chronic skin changes/hyperpigmentation.  Gastrointestinal system: Abdomen is nondistended, soft and non tender. Normal bowel sounds heard. Foley catheter +: no gross hematuria.  Central nervous system: Alert and oriented. No focal neurological deficits.  Extremities: Symmetric 5 x 5 power. Left BKA stump with compression stocking.   Discharge Instructions      Discharge Orders   Future Appointments Provider Department Dept Phone   11/11/2012 1:30 PM Leeanne Rio, [place] at  [facility]   Future Orders Complete By Expires   Call MD for:  extreme fatigue  As directed    Call MD for:  persistant dizziness or light-headedness  As directed    Call MD for:  persistant nausea and vomiting  As directed  Call MD for:  redness, tenderness, or signs of infection (pain, swelling, redness, odor or green/yellow discharge around incision site)  As directed    Call MD for:  severe uncontrolled pain  As directed    Call MD for:  temperature >100.4  As directed    Diet - low sodium heart healthy  As directed    Diet Carb Modified  As directed    Increase activity slowly  As directed        Medication List    STOP taking these medications       oxyCODONE-acetaminophen 5-325 MG per tablet  Commonly known as:  ROXICET     traMADol 50 MG tablet  Commonly known as:  ULTRAM      TAKE these medications       atorvastatin 40 MG tablet  Commonly known as:  LIPITOR  Take 40 mg by mouth every evening. Patient takes in the evening     ciprofloxacin 500 MG tablet  Commonly known as:  CIPRO  Take 1 tablet  (500 mg total) by mouth 2 (two) times daily. Stop after 11/07/2012 doses.     cyclobenzaprine 5 MG tablet  Commonly known as:  FLEXERIL  Take 5 mg by mouth at bedtime.     DSS 100 MG Caps  Take 100 mg by mouth 2 (two) times daily.     ferrous sulfate 325 (65 FE) MG tablet  Take 325 mg by mouth 2 (two) times daily.     HYDROcodone-acetaminophen 5-325 MG per tablet  Commonly known as:  NORCO/VICODIN  Take 1 tablet by mouth every 6 (six) hours as needed for pain.     insulin aspart 100 UNIT/ML injection  Commonly known as:  novoLOG  Inject 3 Units into the skin 3 (three) times daily with meals. And q HS for BS> 150.     insulin glargine 100 UNIT/ML injection  Commonly known as:  LANTUS  Inject 0.15 mLs (15 Units total) into the skin at bedtime.     levothyroxine 200 MCG tablet  Commonly known as:  SYNTHROID, LEVOTHROID  Take 200 mcg by mouth every morning.     lidocaine 2 % jelly  Commonly known as:  XYLOCAINE  Apply topically every 8 (eight) hours as needed (urethral pain).     multivitamins ther. w/minerals Tabs tablet  Take 1 tablet by mouth daily.     phenazopyridine 100 MG tablet  Commonly known as:  PYRIDIUM  Take 100 mg by mouth 3 (three) times daily as needed for pain (for candidiasis).     tamsulosin 0.4 MG Caps capsule  Commonly known as:  FLOMAX  Take 0.4 mg by mouth at bedtime.     warfarin 2 MG tablet  Commonly known as:  COUMADIN  Take 1 tablet (2 mg total) by mouth daily at 6 PM.       Follow-up Information   Follow up with Leeanne Rio, PA-C On 11/11/2012. (1:30 PM.)    Specialty:  Physician Assistant   Contact information:   [place] [address] [phone]       Schedule an appointment as soon as possible for a visit with Jorja Loa, MD.   Specialty:  Urology   Contact information:   [place] [id number] [phone]       Follow up with Home Health RN On 11/06/2012.  (To draw labs (CBC, PT & INR) on 11/06/12 and forward  results to listed PCP for coumadin dosage and further instructions.)        The results of significant diagnostics from this hospitalization (including imaging, microbiology, ancillary and laboratory) are listed below for reference.    Significant Diagnostic Studies: Ct Abdomen Pelvis Wo Contrast  10/29/2012   *RADIOLOGY REPORT*  Clinical Data: Abdominal pain.  CT ABDOMEN AND PELVIS WITHOUT CONTRAST  Technique:  Multidetector CT imaging of the abdomen and pelvis was performed following the standard protocol without intravenous contrast.  Comparison: CT of the abdomen and pelvis 10/10/2012.  Findings:  Lung Bases: Atherosclerotic calcifications of the left main, left anterior descending, left circumflex and right coronary arteries.  Abdomen/Pelvis:  Calcified gallstones layering dependently in the gallbladder.  No current findings to suggest acute cholecystitis at this time.  The visualized portions of the unenhanced liver, pancreas, spleen and bilateral adrenal glands are unremarkable.  As with the prior examination there are numerous nonobstructive calculi within the collecting systems of the kidneys bilaterally. The largest of these measures approximately 9 mm in the lower pole collecting system of the right kidney.  There are no calcified ureteral stones or bladder stones.  However, there continues to be diffuse perinephric and severe bilateral periureteric stranding (right greater than left).  As well, there appears to be some mild right-sided hydroureteronephrosis and mild left-sided hydroureter. The overall appearance is very similar to the prior examination from 10/10/2012.  The urinary bladder is nearly completely decompressed with a Foley balloon catheter in place.  A small amount of gas in the nondependent portion of the urinary bladder is presumably iatrogenic.  Numerous colonic diverticula are noted, without surrounding inflammatory changes to  suggest acute diverticulitis at this time. No significant volume of ascites.  No pneumoperitoneum.  No pathologic distension of small bowel.  Normal appendix.  No definite pathologic lymphadenopathy identified within the abdomen on today's noncontrast CT examination, although there are numerous reactive size lymph nodes throughout the retroperitoneum. Additionally, there are enlarged lymph nodes along the pelvic side wall bilaterally, largest of which is in the left external iliac nodal stations measuring approximately 4.1 x 3.5 cm (image 90 of series 2); these are similar to numerous prior examinations, and have actually decreased compared to remote prior study from 07/04/2010, and are presumably benign.  A small left inguinal hernia containing only fat incidentally noted.  Musculoskeletal: Status post bilateral total hip arthroplasty. Small locules of gas are noted in the subcutaneous fat of the anterior abdominal wall, presumably iatrogenic.  There are no aggressive appearing lytic or blastic lesions noted in the visualized portions of the skeleton.  IMPRESSION: 1.  Highly unusual appearance of the ureters and collecting systems of the kidneys bilaterally, similar to the recent prior examination, as above.  Although there are numerous nonobstructive calculi within the collecting systems of the kidneys bilaterally, there are no ureteral stones identified at this time.  Overall, the appearance is favored to represent distal ureteral strictures bilaterally (right greater than left) and/or recurrent upper tract urinary tract infection.  Clinical correlation is recommended. 2.  Cholelithiasis without evidence to suggest acute cholecystitis at this time. 3.  Normal appendix. 4.  Colonic diverticulosis without findings to suggest acute diverticulitis at this time. 5.  Atherosclerosis, including left main and three vessel coronary artery disease.  Please note that although the presence of coronary artery calcium  documents the presence of coronary artery disease, the severity of this disease and any potential stenosis cannot be assessed on this non-gated CT examination.  Assessment for potential risk factor modification, dietary therapy or pharmacologic therapy may be warranted, if clinically indicated. 6.  Additional incidental findings, as above.   Original Report Authenticated By: Vinnie Langton, M.D.   Ct Pelvis Wo Contrast  10/29/2012   *RADIOLOGY REPORT*  Clinical Data: Pelvic pain.  Recent history of bladder stretching. Evaluate for potential bladder perforation.  CT PELVIS WITHOUT CONTRAST  Technique:  Multidetector CT imaging of the pelvis was performed following the standard protocol without intravenous contrast.  Comparison: CT of the abdomen and pelvis without contrast 10/29/2012.  Findings: The bladder is opacified with contrast, with exception of the most nondependent portion which contains a small amount of gas. There is reflux of contrast material into the distal ureters bilaterally (left greater than right). Marked thickening of the urothelium in the ureters bilaterally and extensive periureteric and perinephric stranding bilaterally again noted.  No extravasation of contrast material outside the confines of the urinary bladder are noted. Status post bilateral total hip arthroplasty.  No other significant change compared to tonight's earlier examination.  IMPRESSION: 1.  No findings to suggest bladder rupture.  No significant change from earlier examination obtained tonight.   Original Report Authenticated By: Vinnie Langton, M.D.    Microbiology: Recent Results (from the past 240 hour(s))  MRSA PCR SCREENING     Status: None   Collection Time    10/29/12 10:25 PM      Result Value Range Status   MRSA by PCR NEGATIVE  NEGATIVE Final   Comment:            The GeneXpert MRSA Assay (FDA     approved for NASAL specimens     only), is one component of a     comprehensive MRSA colonization      surveillance program. It is not     intended to diagnose MRSA     infection nor to guide or     monitor treatment for     MRSA infections.  CULTURE, BLOOD (ROUTINE X 2)     Status: None   Collection Time    10/29/12 11:05 PM      Result Value Range Status   Specimen Description BLOOD LEFT HAND   Final   Special Requests BOTTLES DRAWN AEROBIC ONLY 2CC   Final   Culture  Setup Time     Final   Value: 10/30/2012 05:21     Performed at [facility]   Culture     Final   Value:        BLOOD CULTURE RECEIVED NO GROWTH TO DATE CULTURE WILL BE HELD FOR 5 DAYS BEFORE ISSUING A FINAL NEGATIVE REPORT     Performed at [facility]   Report Status PENDING   Incomplete  CULTURE, BLOOD (ROUTINE X 2)     Status: None   Collection Time    10/29/12 11:11 PM      Result Value Range Status   Specimen Description BLOOD RIGHT ANTECUBITAL   Final   Special Requests BOTTLES DRAWN AEROBIC AND ANAEROBIC 3CC   Final   Culture  Setup Time     Final   Value: 10/30/2012 05:21     Performed at [facility]   Culture     Final   Value:        BLOOD CULTURE RECEIVED NO GROWTH TO DATE CULTURE WILL BE HELD FOR 5 DAYS BEFORE ISSUING A FINAL NEGATIVE REPORT     Performed at [facility]   Report Status PENDING   Incomplete  URINE CULTURE     Status: None   Collection Time    10/30/12 12:30 PM      Result Value Range Status   Specimen Description URINE, CATHETERIZED   Final   Special Requests NONE   Final   Culture  Setup Time     Final   Value: 10/30/2012 15:34     Performed at [facility]     Final   Value: NO GROWTH     Performed at [facility]   Culture     Final   Value: NO GROWTH     Performed at [facility]   Report Status 10/31/2012 FINAL   Final     Labs: Basic Metabolic Panel:  Recent Labs Lab 10/30/12 0520 10/31/12 0335 11/01/12 0410 11/02/12 0451 11/03/12 0748  NA 134* 135 133* 134* 136  K 4.1 4.2 4.0 3.6 3.7   CL 108 109 109 106 105  CO2 17* 18* 16* 21 24  GLUCOSE 135* 163* 204* 166* 175*  BUN 65* 61* 47* 36* 29*  CREATININE 4.25* 3.62* 2.61* 2.09* 1.81*  CALCIUM 7.5* 7.8* 8.0* 8.2* 8.2*   Liver Function Tests:  Recent Labs Lab 10/29/12 1642  AST 10  ALT 10  ALKPHOS 102  BILITOT 0.4  PROT 6.0  ALBUMIN 2.3*   No results found for this basename: LIPASE, AMYLASE,  in the last 168 hours No results found for this basename: AMMONIA,  in the last 168 hours CBC:  Recent Labs Lab 10/29/12 1642 10/30/12 0520 10/31/12 0335 11/01/12 0410 11/02/12 0451  WBC 5.6 4.8 4.3 4.3 4.3  NEUTROABS 4.3  --   --   --   --   HGB 7.6* 8.8* 8.8* 9.1* 8.8*  HCT 24.4* 27.7* 28.5* 29.0* 28.1*  MCV 84.4 83.4 83.6 82.6 81.7  PLT 187 153 165 179 164   Cardiac Enzymes:  Recent Labs Lab 10/29/12 2311 10/30/12 0520 10/30/12 1050  TROPONINI <0.30 <0.30 <0.30   BNP: BNP (last 3 results) No results found for this basename: PROBNP,  in the last 8760 hours CBG:  Recent Labs Lab 11/03/12 1223 11/03/12 1709 11/03/12 2223 11/04/12 0729 11/04/12 1207  GLUCAP 184* 191* 236* 185* 243*    Additional labs:  1. Lipid panel: Cholesterol 100, TG 226, HDL 12, LDL 43 and VLDL 45. 2. Venous lactate on admission: 1.66 3. Random cortisol: 8.6     Signed:  Alsha Meland  [facility] 11/04/2012, 2:28 PM

## 2012-11-04 NOTE — Progress Notes (Signed)
PT Cancellation Note  ___Treatment cancelled today due to medical issues with patient which prohibited therapy  ___ Treatment cancelled today due to patient receiving procedure or test   ___ Treatment cancelled today due to patient's refusal to participate   _X_ Pt declined despite 3 different attempt.         1. Pt wanted to eat breakfast first         2. Pt wanted pain meds first         3. Pt wanted to wait till wife came with his wheelchair Will check back later today/tomorrow   [name]  PTA [facility]  Acute  Rehab Pager      [phone]

## 2012-11-04 NOTE — Care Management Note (Addendum)
Cm spoke with patient at the bedside concerning discharge planning. Pt refusing SNF. Per pt disposition is home with [facility] services, Per pt choice [facility] to provide [facility]. [facility] rep notified at [phone].Pt states spouse to assist in home care. Pt states spouse to provide tx home. No Pcp on record. Pt states will follow up with [facility], [address],[place]) [phone].Cm spoke with [facility] to schedule an appointment for pt but representative states patient has an outstanding balance which will require a payment arrangement prior to the scheduling of an appt. CM spoke with pt at bedside with Charge RN Maudie Mercury present concerning need for an established PCP appt prior to discharge. Pt agreed to Cm scheduling an appt at [facility] at [facility]. Appt scheduled for 11/11/12 at 1:30pm. Awaiting MD order for [facility] for PT/INR draws with lab results sent to Dr. Vergia Alcon, [address] [place], [place] [address].    Venita Lick Jessalyn Hinojosa,RN,MSN [phone]

## 2012-11-05 DIAGNOSIS — Z466 Encounter for fitting and adjustment of urinary device: Secondary | ICD-10-CM | POA: Diagnosis not present

## 2012-11-05 DIAGNOSIS — G8929 Other chronic pain: Secondary | ICD-10-CM | POA: Diagnosis not present

## 2012-11-05 DIAGNOSIS — M1990 Unspecified osteoarthritis, unspecified site: Secondary | ICD-10-CM | POA: Diagnosis not present

## 2012-11-05 DIAGNOSIS — E119 Type 2 diabetes mellitus without complications: Secondary | ICD-10-CM | POA: Diagnosis not present

## 2012-11-05 LAB — CULTURE, BLOOD (ROUTINE X 2): Culture: NO GROWTH

## 2012-11-06 DIAGNOSIS — M1990 Unspecified osteoarthritis, unspecified site: Secondary | ICD-10-CM | POA: Diagnosis not present

## 2012-11-06 DIAGNOSIS — Z466 Encounter for fitting and adjustment of urinary device: Secondary | ICD-10-CM | POA: Diagnosis not present

## 2012-11-06 DIAGNOSIS — G8929 Other chronic pain: Secondary | ICD-10-CM | POA: Diagnosis not present

## 2012-11-06 DIAGNOSIS — E119 Type 2 diabetes mellitus without complications: Secondary | ICD-10-CM | POA: Diagnosis not present

## 2012-11-07 ENCOUNTER — Telehealth: Payer: Self-pay

## 2012-11-07 DIAGNOSIS — E119 Type 2 diabetes mellitus without complications: Secondary | ICD-10-CM | POA: Diagnosis not present

## 2012-11-07 DIAGNOSIS — M1990 Unspecified osteoarthritis, unspecified site: Secondary | ICD-10-CM | POA: Diagnosis not present

## 2012-11-07 DIAGNOSIS — G8929 Other chronic pain: Secondary | ICD-10-CM | POA: Diagnosis not present

## 2012-11-07 DIAGNOSIS — Z466 Encounter for fitting and adjustment of urinary device: Secondary | ICD-10-CM | POA: Diagnosis not present

## 2012-11-07 NOTE — Telephone Encounter (Signed)
[name] w/ [facility] called stating that patient is going to be seeing [facility] for a new patient and they want him to be aware that pt is very non compliant and doesn't take his insulin  [name] also stated that [employer] [phone] is pts main nurse

## 2012-11-09 DIAGNOSIS — Z466 Encounter for fitting and adjustment of urinary device: Secondary | ICD-10-CM | POA: Diagnosis not present

## 2012-11-09 DIAGNOSIS — M1990 Unspecified osteoarthritis, unspecified site: Secondary | ICD-10-CM | POA: Diagnosis not present

## 2012-11-09 DIAGNOSIS — G8929 Other chronic pain: Secondary | ICD-10-CM | POA: Diagnosis not present

## 2012-11-09 DIAGNOSIS — E119 Type 2 diabetes mellitus without complications: Secondary | ICD-10-CM | POA: Diagnosis not present

## 2012-11-11 ENCOUNTER — Ambulatory Visit (INDEPENDENT_AMBULATORY_CARE_PROVIDER_SITE_OTHER): Payer: Medicare Other | Admitting: Physician Assistant

## 2012-11-11 ENCOUNTER — Encounter: Payer: Self-pay | Admitting: Physician Assistant

## 2012-11-11 VITALS — BP 124/82 | HR 105 | Temp 98.0°F | Resp 20 | Ht 71.0 in

## 2012-11-11 DIAGNOSIS — E039 Hypothyroidism, unspecified: Secondary | ICD-10-CM | POA: Diagnosis not present

## 2012-11-11 DIAGNOSIS — Z5181 Encounter for therapeutic drug level monitoring: Secondary | ICD-10-CM | POA: Diagnosis not present

## 2012-11-11 DIAGNOSIS — Z7901 Long term (current) use of anticoagulants: Secondary | ICD-10-CM | POA: Diagnosis not present

## 2012-11-11 DIAGNOSIS — Z299 Encounter for prophylactic measures, unspecified: Secondary | ICD-10-CM

## 2012-11-11 DIAGNOSIS — N10 Acute tubulo-interstitial nephritis: Secondary | ICD-10-CM

## 2012-11-11 DIAGNOSIS — N183 Chronic kidney disease, stage 3 unspecified: Secondary | ICD-10-CM

## 2012-11-11 DIAGNOSIS — N179 Acute kidney failure, unspecified: Secondary | ICD-10-CM

## 2012-11-11 DIAGNOSIS — I10 Essential (primary) hypertension: Secondary | ICD-10-CM

## 2012-11-11 DIAGNOSIS — G8929 Other chronic pain: Secondary | ICD-10-CM | POA: Diagnosis not present

## 2012-11-11 DIAGNOSIS — Z466 Encounter for fitting and adjustment of urinary device: Secondary | ICD-10-CM | POA: Diagnosis not present

## 2012-11-11 DIAGNOSIS — E785 Hyperlipidemia, unspecified: Secondary | ICD-10-CM | POA: Diagnosis not present

## 2012-11-11 DIAGNOSIS — M1990 Unspecified osteoarthritis, unspecified site: Secondary | ICD-10-CM | POA: Diagnosis not present

## 2012-11-11 DIAGNOSIS — A419 Sepsis, unspecified organism: Secondary | ICD-10-CM

## 2012-11-11 DIAGNOSIS — E119 Type 2 diabetes mellitus without complications: Secondary | ICD-10-CM | POA: Diagnosis not present

## 2012-11-11 DIAGNOSIS — N189 Chronic kidney disease, unspecified: Secondary | ICD-10-CM

## 2012-11-11 DIAGNOSIS — D649 Anemia, unspecified: Secondary | ICD-10-CM

## 2012-11-11 LAB — CBC WITH DIFFERENTIAL/PLATELET
Basophils Absolute: 0 10*3/uL (ref 0.0–0.1)
Basophils Relative: 1 % (ref 0–1)
Eosinophils Absolute: 0.2 10*3/uL (ref 0.0–0.7)
Eosinophils Relative: 2 % (ref 0–5)
HCT: 31 % — ABNORMAL LOW (ref 39.0–52.0)
Hemoglobin: 10 g/dL — ABNORMAL LOW (ref 13.0–17.0)
MCH: 25.3 pg — ABNORMAL LOW (ref 26.0–34.0)
MCHC: 32.3 g/dL (ref 30.0–36.0)
MCV: 78.5 fL (ref 78.0–100.0)
Monocytes Absolute: 0.5 10*3/uL (ref 0.1–1.0)
Monocytes Relative: 7 % (ref 3–12)
RDW: 17.2 % — ABNORMAL HIGH (ref 11.5–15.5)

## 2012-11-11 LAB — PROTIME-INR
INR: 2.46 — ABNORMAL HIGH (ref <1.50)
Prothrombin Time: 25.7 seconds — ABNORMAL HIGH (ref 11.6–15.2)

## 2012-11-11 LAB — LIPID PANEL
Cholesterol: 125 mg/dL (ref 0–200)
HDL: 20 mg/dL — ABNORMAL LOW (ref >39)

## 2012-11-11 LAB — BASIC METABOLIC PANEL
BUN: 44 mg/dL — ABNORMAL HIGH (ref 6–23)
Calcium: 8.9 mg/dL (ref 8.4–10.5)
Creat: 2.57 mg/dL — ABNORMAL HIGH (ref 0.50–1.35)
Glucose, Bld: 131 mg/dL — ABNORMAL HIGH (ref 70–99)
Potassium: 4.4 mEq/L (ref 3.5–5.3)

## 2012-11-11 LAB — HEPATIC FUNCTION PANEL
AST: 10 U/L (ref 0–37)
Albumin: 3.1 g/dL — ABNORMAL LOW (ref 3.5–5.2)
Total Bilirubin: 0.6 mg/dL (ref 0.3–1.2)

## 2012-11-11 MED ORDER — INSULIN ASPART 100 UNIT/ML ~~LOC~~ SOLN: 3.0000 [IU] | Freq: Three times a day (TID) | SUBCUTANEOUS | Status: DC

## 2012-11-11 MED ORDER — INSULIN GLARGINE 100 UNIT/ML ~~LOC~~ SOLN: 15.0000 [IU] | Freq: Every day | SUBCUTANEOUS | Status: DC

## 2012-11-11 NOTE — Patient Instructions (Addendum)
Please obtain labs today.  I will call you with the results.  We will adjust medications as necessary, pending lab results.  We will set up a follow-up appointment depending on results.  Please follow-up with your Urologist.  I ill speak with your home health nurse and attempt to set up a way to have your Coumadin checked.

## 2012-11-11 NOTE — Progress Notes (Signed)
Subjective:    Patient ID: [name], male    DOB: December 22, 1955, 57 y.o.   MRN: [id number]  HPI Patient presents today to establish care.  Patient is also an ED/Hopsital follow-up. Patient presented to the ED on 10/29/12 after EMS was called by home health nurse, who states his catheter bag was filled with blood. (Patient has bladder procedure on 10/22/12) Patient finally agreed to be taken to the hospital. Patient was evaluated and diagnosed with sepsis 2/2 pyelonephritis and acute on chronic renal failure.  Patient empirically treated with IV cefepime before being switched to oral ciprofloxacin for a total of 10 days course.  Patient was agressively rehydrated and kidney function returned to baseline.  It was recommended that patient be discharged to SNF to ensure adequate care, given patient's current medical state and lack of caregiver at home.  Patient declined repeatedly and was discharged home with wife.  Since patient was discharged from the hospital, he states he has been doing fine.  Endorses finishing antibiotic therapy.  He is seen by home health who monitors his medications, including his coumadin, and have been checking his PT/INR since patient has difficulty with transportation.  I have not received any results from home health agency.  Denies fevers, chills, nausea, vomiting, flank pain.  Has been taking pyridium to help with catheter irritation.  Denies blood in catheter.  His only concern is his catheter.  States his catheter makes it difficult for him to get around.  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  Patient is a Type II Diabetic with history of HTN, HLD, CKD, DVT requiring chronic coumdain who (from home health nurse) is very noncompliant with his medications.  Patient was dismissed from last PCP practice due to noncompliance.  Acute Concerns:  Patient is requesting removal of his catheter.  States he does not want catheter in and he  might pull it out himself.  Patient has chronic indwelling catheter that was recently changed in the ED.  Patient is followed by Urology and is due for an upcoming Urostomy.  I have stated to the patient several times that he will need to follow-up with urology for removal of catheter.  Chronic Issues: (1) Type II Diabetes Mellitus            -- Patient's diabetes is insulin-dependent.  Patient endorses taking Lantus and Novolog.  Prescription from last PCP is for Novolog 3 units TID with meals and HS if BS >150, and for Lantus 15 units daily at bedtime.  Patient states he has a sliding scale and uses that instead of following last provider's instructions.  He states that he has been a diabetic long enough to know how to take care of himself.    (2) Hypertension            -- Patient endorses history of hypertension controlled in the past with multiple medications, including benazepril.  Patient states that the doctor took him off his medications and he hasn't had any BP medications in 5 months.  BP in clinic today is 124/82.  Patient agrees to sign medical release form so we can obtain records from his last PCP.  (3) Chronic Kidney Disease          -- Patient with history of CKD Stage III.  Patient states he does not have a Nephrologist.  Patient is followed by Urology and is due for Urostomy.  (4) Hyperlipidemia          --  Patient endorses history of hyperlipidemia that is managed by atorvastatin.  States he was also on gemfibrozil at one point but his last PCP took him off of the medication.    (5) Hypothyroidism          -- Patient with history of hypothyroidism requiring 200 mcg levothyroxine daily.  Patient states he takes medication as prescribed.  Denies recent change in weight, constipation, diarrhea, cold/heat intolerance.  (6) Anemia          -- Patient reports chronic anemia that requires iron supplementation.  Denies palpitations, syncope.    (7) BPH         -- Patient reports  history of prostatic enlargement requiring tamsulosin in the past. Patient currently with chronic indwelling catheter.  (8) Obstructive Sleep Apnea          -- Patient endorses wearing nightly CPAP machine at home.  Health Maintenance: Vision -- overdue.  Patient educated on importance of annual dilated eye exams. Dental -- overdue. Colonoscopy -- last in 2014 at [facility]. No abnormal findings Immunizations -- Patient reports up-to-date.  Influenza vaccination given.    Past Medical History  Diagnosis Date  . Sleep apnea     uses cpap  . Hypothyroidism   . Anemia   . Blood transfusion   . Chronic kidney disease     hx of kidney stones  . Hypertension   . Arthritis     osteoarthritis  . Pneumonia   . Diabetes mellitus     insulin dependent  . DVT (deep vein thrombosis) in pregnancy   . Peripheral vascular disease   . Hyperlipidemia   . Morbid obesity   . Hypertrophy of prostate   . Acute respiratory failure   . Cellulitis and abscess of leg   . Chronic kidney disease, stage III (moderate)   . Nephrolithiasis   . Anxiety   . Chronic pain    Current Outpatient Prescriptions on File Prior to Visit  Medication Sig Dispense Refill  . atorvastatin (LIPITOR) 40 MG tablet Take 40 mg by mouth every evening. Patient takes in the evening      . cyclobenzaprine (FLEXERIL) 5 MG tablet Take 5 mg by mouth at bedtime.      . ferrous sulfate 325 (65 FE) MG tablet Take 325 mg by mouth 2 (two) times daily.       Marland Kitchen HYDROcodone-acetaminophen (NORCO/VICODIN) 5-325 MG per tablet Take 1 tablet by mouth every 6 (six) hours as needed for pain.       Marland Kitchen levothyroxine (SYNTHROID, LEVOTHROID) 200 MCG tablet Take 200 mcg by mouth every morning.       . lidocaine (XYLOCAINE) 2 % jelly Apply topically every 8 (eight) hours as needed (urethral pain).  30 mL  0  . Multiple Vitamins-Minerals (MULTIVITAMINS THER. W/MINERALS) TABS Take 1 tablet by mouth daily.       . phenazopyridine (PYRIDIUM) 100 MG tablet  Take 100 mg by mouth 3 (three) times daily as needed for pain (for candidiasis).      . tamsulosin (FLOMAX) 0.4 MG CAPS capsule Take 0.4 mg by mouth at bedtime.       Marland Kitchen warfarin (COUMADIN) 2 MG tablet Take 1 tablet (2 mg total) by mouth daily at 6 PM.  30 tablet  0  . docusate sodium 100 MG CAPS Take 100 mg by mouth 2 (two) times daily.       No current facility-administered medications on file prior to visit.   No Known  Allergies  Family History  Problem Relation Age of Onset  . Diabetes Mother   . Colon cancer Father   . Dementia Father   . Heart attack Father   . Heart attack Mother   . Stroke Mother    History   Social History  . Marital Status: Married    Spouse Name: N/A    Number of Children: N/A  . Years of Education: N/A   Social History Main Topics  . Smoking status: Never Smoker   . Smokeless tobacco: Never Used  . Alcohol Use: No  . Drug Use: No  . Sexual Activity: Yes   Other Topics Concern  . None   Social History Narrative   Lives with his wife and uses a wheelchair for transfers.     Review of Systems  Constitutional: Positive for activity change and fatigue. Negative for fever, appetite change and unexpected weight change.  HENT: Negative for hearing loss, ear pain, tinnitus and ear discharge.   Eyes: Negative for photophobia, pain, discharge, redness and visual disturbance.  Respiratory: Negative for cough, chest tightness, shortness of breath and wheezing.   Cardiovascular: Negative for chest pain and palpitations.  Gastrointestinal: Negative for nausea, vomiting, abdominal pain, diarrhea, constipation, blood in stool and anal bleeding.  Endocrine: Positive for polyuria. Negative for cold intolerance, heat intolerance and polyphagia.  Genitourinary: Positive for dysuria, frequency, hematuria and flank pain. Negative for decreased urine volume, discharge, penile swelling, difficulty urinating, genital sores and penile pain.  Musculoskeletal: Negative  for back pain.  Skin: Negative for rash.  Allergic/Immunologic: Negative for environmental allergies.  Neurological: Negative for dizziness, seizures, syncope and speech difficulty.     Objective:   Physical Exam  Vitals reviewed. Constitutional: He is oriented to person, place, and time.  Well-developed, obese gentleman, sitting comfortably in wheelchair, in no distress  HENT:  Head: Normocephalic and atraumatic.  Right Ear: External ear normal.  Left Ear: External ear normal.  Nose: Nose normal.  Mouth/Throat: Oropharynx is clear and moist. No oropharyngeal exudate.  TM WNL bilaterally  Eyes: Conjunctivae and EOM are normal. Pupils are equal, round, and reactive to light.  Neck: Neck supple.  Cardiovascular: Normal rate, regular rhythm and normal heart sounds.   Pulmonary/Chest: Effort normal and breath sounds normal. No respiratory distress. He has no wheezes. He has no rales. He exhibits no tenderness.  Abdominal: Soft. Bowel sounds are normal. He exhibits no distension and no mass. There is no tenderness. There is no rebound and no guarding.  Lymphadenopathy:    He has no cervical adenopathy.  Neurological: He is alert and oriented to person, place, and time. No cranial nerve deficit.  Skin: Skin is warm and dry.  Patient with L BKA stump with compression stocking.  RLE with chronic venous stasis with trace edema.     I have personally reviewed hospital labs and imaging.  Assessment & Plan:  Septic shock resolved  HYPERTENSION BP good at visit.  Referral to nephrology for management of medications 2/2 CKD.  Hypothyroidism Will repeat labs.  Continue current dose of levothyroixine pending lab results.  DIABETES MELLITUS-TYPE II Patient with history of noncompliance.  Will check labs.  Renal failure, acute on chronic Will obtain repeat labs.  Foley with adequate drainage.  Encourage good hydration.  CKD (chronic kidney disease) stage 3, GFR 30-59 ml/min Repeat labs.   Referral to Nephrology.  HYPERLIPIDEMIA Continue atorvastatin.  Will check lipid profile.  ANEMIA-UNSPECIFIED Will repeat labs.  Continue iron supplementation.  Preventive measure Influenza vaccination given.  Reviewed importance of annual eye exams and dental visits with the patient.

## 2012-11-12 ENCOUNTER — Telehealth: Payer: Self-pay | Admitting: Physician Assistant

## 2012-11-12 DIAGNOSIS — N189 Chronic kidney disease, unspecified: Secondary | ICD-10-CM

## 2012-11-12 DIAGNOSIS — E039 Hypothyroidism, unspecified: Secondary | ICD-10-CM

## 2012-11-12 DIAGNOSIS — Z466 Encounter for fitting and adjustment of urinary device: Secondary | ICD-10-CM | POA: Diagnosis not present

## 2012-11-12 DIAGNOSIS — G8929 Other chronic pain: Secondary | ICD-10-CM | POA: Diagnosis not present

## 2012-11-12 DIAGNOSIS — N10 Acute tubulo-interstitial nephritis: Secondary | ICD-10-CM | POA: Diagnosis not present

## 2012-11-12 DIAGNOSIS — M1990 Unspecified osteoarthritis, unspecified site: Secondary | ICD-10-CM | POA: Diagnosis not present

## 2012-11-12 DIAGNOSIS — E119 Type 2 diabetes mellitus without complications: Secondary | ICD-10-CM | POA: Diagnosis not present

## 2012-11-12 LAB — HEMOGLOBIN A1C: Mean Plasma Glucose: 148 mg/dL — ABNORMAL HIGH (ref <117)

## 2012-11-12 LAB — URINALYSIS, ROUTINE W REFLEX MICROSCOPIC
Nitrite: POSITIVE — AB
pH: 5.5 (ref 5.0–8.0)

## 2012-11-12 LAB — URINALYSIS, MICROSCOPIC ONLY: Casts: NONE SEEN

## 2012-11-12 LAB — TSH: TSH: 4.744 u[IU]/mL — ABNORMAL HIGH (ref 0.350–4.500)

## 2012-11-12 MED ORDER — LEVOTHYROXINE SODIUM 25 MCG PO TABS: 25.0000 ug | ORAL_TABLET | Freq: Every day | ORAL | Status: DC

## 2012-11-12 NOTE — Telephone Encounter (Signed)
Please inform patient that I have received his lab results.  His cholesterol looks good at present time.  Continue medications as prescribed.  PT/INR are in good range at the moment.  Kidney function is still not back to his baseline.  I encourage good hydration. I am setting him up with a referral to Nephrology to manage his BP medications due to his kidney disease.   A1C is 6.8.  Also, his TSH was elevated, indicating he is not getting enough Thyroid hormone.  He currently takes 200 mcg daily.  I have sent in a prescription for 25 mcg tablets.  He should take one of those pills in addition to one of the 200 mcg tablets a day (total of 225 mcg).  I would like to see him in 1 week to repeat PT/INR, kidney function.  I also need to recheck his thyroid function tests in 6 weeks.  Also patient's urine is improved from hospital stay but still shows evidence of blood and white blood cells.  I have sent his urine for culture.

## 2012-11-13 DIAGNOSIS — Z466 Encounter for fitting and adjustment of urinary device: Secondary | ICD-10-CM | POA: Diagnosis not present

## 2012-11-13 DIAGNOSIS — M1990 Unspecified osteoarthritis, unspecified site: Secondary | ICD-10-CM | POA: Diagnosis not present

## 2012-11-13 DIAGNOSIS — Z299 Encounter for prophylactic measures, unspecified: Secondary | ICD-10-CM | POA: Insufficient documentation

## 2012-11-13 DIAGNOSIS — G8929 Other chronic pain: Secondary | ICD-10-CM | POA: Diagnosis not present

## 2012-11-13 DIAGNOSIS — E119 Type 2 diabetes mellitus without complications: Secondary | ICD-10-CM | POA: Diagnosis not present

## 2012-11-13 NOTE — Assessment & Plan Note (Signed)
Will obtain repeat labs.  Foley with adequate drainage.  Encourage good hydration.

## 2012-11-13 NOTE — Telephone Encounter (Signed)
Pt left message returning your call and can be reached at [phone].

## 2012-11-13 NOTE — Assessment & Plan Note (Signed)
BP good at visit.  Referral to nephrology for management of medications 2/2 CKD.

## 2012-11-13 NOTE — Telephone Encounter (Signed)
LMOM [2nd] with contact name and number for return call RE: results and further provider instructions/SLS

## 2012-11-13 NOTE — Assessment & Plan Note (Signed)
Repeat labs.  Referral to Nephrology.

## 2012-11-13 NOTE — Assessment & Plan Note (Signed)
Will repeat labs.  Continue current dose of levothyroixine pending lab results.

## 2012-11-13 NOTE — Assessment & Plan Note (Signed)
resolved 

## 2012-11-13 NOTE — Assessment & Plan Note (Signed)
Influenza vaccination given.  Reviewed importance of annual eye exams and dental visits with the patient.

## 2012-11-13 NOTE — Telephone Encounter (Signed)
LMOM with contact name and number for return call RE: results and further provider instructions/SLS  

## 2012-11-13 NOTE — Assessment & Plan Note (Signed)
Patient with history of noncompliance.  Will check labs.

## 2012-11-13 NOTE — Assessment & Plan Note (Signed)
Continue atorvastatin.  Will check lipid profile.

## 2012-11-13 NOTE — Assessment & Plan Note (Signed)
Will repeat labs.  Continue iron supplementation.

## 2012-11-14 DIAGNOSIS — G8929 Other chronic pain: Secondary | ICD-10-CM | POA: Diagnosis not present

## 2012-11-14 DIAGNOSIS — M1990 Unspecified osteoarthritis, unspecified site: Secondary | ICD-10-CM | POA: Diagnosis not present

## 2012-11-14 DIAGNOSIS — Z466 Encounter for fitting and adjustment of urinary device: Secondary | ICD-10-CM | POA: Diagnosis not present

## 2012-11-14 DIAGNOSIS — E119 Type 2 diabetes mellitus without complications: Secondary | ICD-10-CM | POA: Diagnosis not present

## 2012-11-14 DIAGNOSIS — N319 Neuromuscular dysfunction of bladder, unspecified: Secondary | ICD-10-CM | POA: Diagnosis not present

## 2012-11-14 DIAGNOSIS — B3749 Other urogenital candidiasis: Secondary | ICD-10-CM | POA: Diagnosis not present

## 2012-11-14 LAB — CULTURE, URINE COMPREHENSIVE
Colony Count: NO GROWTH
Organism ID, Bacteria: NO GROWTH

## 2012-11-15 DIAGNOSIS — Z466 Encounter for fitting and adjustment of urinary device: Secondary | ICD-10-CM | POA: Diagnosis not present

## 2012-11-15 DIAGNOSIS — G8929 Other chronic pain: Secondary | ICD-10-CM | POA: Diagnosis not present

## 2012-11-15 DIAGNOSIS — M1990 Unspecified osteoarthritis, unspecified site: Secondary | ICD-10-CM | POA: Diagnosis not present

## 2012-11-15 DIAGNOSIS — E119 Type 2 diabetes mellitus without complications: Secondary | ICD-10-CM | POA: Diagnosis not present

## 2012-11-15 NOTE — Telephone Encounter (Signed)
LMOM [3rd] with contact name and number for return call RE: results and further provider instructions/SLS

## 2012-11-18 NOTE — Telephone Encounter (Signed)
[facility] final] with contact name and number for return call RE: results and further provider instructions; informed that if not reached by end of business day, a letter with this information would be mailed/SLS Pt had also called RE: medication given by Urology that "helps when he pees", only Flomax listed in EMR [Historical], requested call back with name of Rx/SLS

## 2012-11-19 ENCOUNTER — Telehealth: Payer: Self-pay | Admitting: Physician Assistant

## 2012-11-19 ENCOUNTER — Encounter: Payer: Self-pay | Admitting: *Deleted

## 2012-11-19 DIAGNOSIS — N183 Chronic kidney disease, stage 3 unspecified: Secondary | ICD-10-CM | POA: Diagnosis not present

## 2012-11-19 DIAGNOSIS — G8929 Other chronic pain: Secondary | ICD-10-CM | POA: Diagnosis not present

## 2012-11-19 DIAGNOSIS — M1990 Unspecified osteoarthritis, unspecified site: Secondary | ICD-10-CM | POA: Diagnosis not present

## 2012-11-19 DIAGNOSIS — I10 Essential (primary) hypertension: Secondary | ICD-10-CM | POA: Diagnosis not present

## 2012-11-19 DIAGNOSIS — Z466 Encounter for fitting and adjustment of urinary device: Secondary | ICD-10-CM | POA: Diagnosis not present

## 2012-11-19 DIAGNOSIS — E119 Type 2 diabetes mellitus without complications: Secondary | ICD-10-CM | POA: Diagnosis not present

## 2012-11-19 NOTE — Telephone Encounter (Signed)
Letter Mailed w/copy of lab results & provider instructions/SLS

## 2012-11-19 NOTE — Telephone Encounter (Signed)
Received medical records from [facility]

## 2012-11-21 DIAGNOSIS — Z466 Encounter for fitting and adjustment of urinary device: Secondary | ICD-10-CM | POA: Diagnosis not present

## 2012-11-21 DIAGNOSIS — M1990 Unspecified osteoarthritis, unspecified site: Secondary | ICD-10-CM | POA: Diagnosis not present

## 2012-11-21 DIAGNOSIS — G8929 Other chronic pain: Secondary | ICD-10-CM | POA: Diagnosis not present

## 2012-11-21 DIAGNOSIS — E119 Type 2 diabetes mellitus without complications: Secondary | ICD-10-CM | POA: Diagnosis not present

## 2012-11-25 ENCOUNTER — Telehealth: Payer: Self-pay

## 2012-11-25 MED ORDER — "INSULIN SYRINGE 31G X 5/16"" 0.5 ML MISC": Status: DC

## 2012-11-25 NOTE — Telephone Encounter (Signed)
Pt left a message stating "he is sick of talking to voice mails" and not getting an option to leave a message for PA [facility]. Lillia Mountain stated that Rise Paganini will be out this Friday to fix this). Pt stated he needs insulin syringes sent to his pharmacy and lab results.  Please advise?

## 2012-11-25 NOTE — Telephone Encounter (Signed)
Rx request for Insulin syringes phoned in to pharmacy; using last ordered, as we had no history in EMR/SLS Numerous attempts were made to contact patient on lab results & further provider instructions; letter was mailed with this information on 09.23.14 after [4] four unsuccessful attempts to reach patient; pt has Not called office and asked to speak to provider's assistant at main number. Pt has not been compliant with provider instructions at 09.15.14 NP to Establish visit to follow-up in [2] two weeks/SLS

## 2012-11-27 DIAGNOSIS — E119 Type 2 diabetes mellitus without complications: Secondary | ICD-10-CM | POA: Diagnosis not present

## 2012-11-27 DIAGNOSIS — M1990 Unspecified osteoarthritis, unspecified site: Secondary | ICD-10-CM | POA: Diagnosis not present

## 2012-11-27 DIAGNOSIS — Z466 Encounter for fitting and adjustment of urinary device: Secondary | ICD-10-CM | POA: Diagnosis not present

## 2012-11-27 DIAGNOSIS — G8929 Other chronic pain: Secondary | ICD-10-CM | POA: Diagnosis not present

## 2012-11-28 DIAGNOSIS — Z466 Encounter for fitting and adjustment of urinary device: Secondary | ICD-10-CM | POA: Diagnosis not present

## 2012-11-28 DIAGNOSIS — M1990 Unspecified osteoarthritis, unspecified site: Secondary | ICD-10-CM | POA: Diagnosis not present

## 2012-11-28 DIAGNOSIS — E119 Type 2 diabetes mellitus without complications: Secondary | ICD-10-CM | POA: Diagnosis not present

## 2012-11-28 DIAGNOSIS — G8929 Other chronic pain: Secondary | ICD-10-CM | POA: Diagnosis not present

## 2012-11-29 DIAGNOSIS — Z466 Encounter for fitting and adjustment of urinary device: Secondary | ICD-10-CM | POA: Diagnosis not present

## 2012-11-29 DIAGNOSIS — G8929 Other chronic pain: Secondary | ICD-10-CM | POA: Diagnosis not present

## 2012-11-29 DIAGNOSIS — M1990 Unspecified osteoarthritis, unspecified site: Secondary | ICD-10-CM | POA: Diagnosis not present

## 2012-11-29 DIAGNOSIS — E119 Type 2 diabetes mellitus without complications: Secondary | ICD-10-CM | POA: Diagnosis not present

## 2012-12-02 DIAGNOSIS — N289 Disorder of kidney and ureter, unspecified: Secondary | ICD-10-CM | POA: Diagnosis not present

## 2012-12-02 DIAGNOSIS — R109 Unspecified abdominal pain: Secondary | ICD-10-CM | POA: Diagnosis not present

## 2012-12-02 DIAGNOSIS — R319 Hematuria, unspecified: Secondary | ICD-10-CM | POA: Diagnosis not present

## 2012-12-02 DIAGNOSIS — N200 Calculus of kidney: Secondary | ICD-10-CM | POA: Diagnosis not present

## 2012-12-03 ENCOUNTER — Telehealth: Payer: Self-pay | Admitting: *Deleted

## 2012-12-03 DIAGNOSIS — Z466 Encounter for fitting and adjustment of urinary device: Secondary | ICD-10-CM | POA: Diagnosis not present

## 2012-12-03 DIAGNOSIS — M1990 Unspecified osteoarthritis, unspecified site: Secondary | ICD-10-CM | POA: Diagnosis not present

## 2012-12-03 DIAGNOSIS — E119 Type 2 diabetes mellitus without complications: Secondary | ICD-10-CM | POA: Diagnosis not present

## 2012-12-03 DIAGNOSIS — G8929 Other chronic pain: Secondary | ICD-10-CM | POA: Diagnosis not present

## 2012-12-03 NOTE — Telephone Encounter (Signed)
Received call from [facility] with [place] reporting PT 42.7 and INR 3.6 from today. Reports that she checked INR with pt's monitor and result was 3.4. Pt reports no new medications and has no active bleeding or bruising. Per verbal from Dr Charlett Blake, pt should hold coumadin today and tomorrow, recheck Thursday and call with result.  Left detailed instructions on Lisa's voicemail to notify pt and call if any questions.

## 2012-12-05 ENCOUNTER — Telehealth: Payer: Self-pay | Admitting: Physician Assistant

## 2012-12-05 DIAGNOSIS — M1990 Unspecified osteoarthritis, unspecified site: Secondary | ICD-10-CM | POA: Diagnosis not present

## 2012-12-05 DIAGNOSIS — G8929 Other chronic pain: Secondary | ICD-10-CM | POA: Diagnosis not present

## 2012-12-05 DIAGNOSIS — E119 Type 2 diabetes mellitus without complications: Secondary | ICD-10-CM | POA: Diagnosis not present

## 2012-12-05 DIAGNOSIS — Z466 Encounter for fitting and adjustment of urinary device: Secondary | ICD-10-CM | POA: Diagnosis not present

## 2012-12-05 NOTE — Telephone Encounter (Signed)
Please inform patient that [name] reports his INR at 2.2 on check today.  He is to resume his 2 mg tablet of coumadin today if he has not already taken it.  He is to take 1/2 tablet (1mg ) Friday, and a whole tablet each day on Saturday and Sunday.  We will see him Monday for follow-up appointment.

## 2012-12-05 NOTE — Telephone Encounter (Signed)
LMOM [home] with contact name and number for return call RE: PT/INR results and Coumadin dosing instructions and further provider instructions, including reminder of F/U appt on Monday, 10.13.14/SLS Attempt to reach pt via mobile phone, VM has not yet been set up/SLS  Will try to reach patient again later this afternoon/SLS 10.09.14

## 2012-12-05 NOTE — Telephone Encounter (Signed)
Sent in Error to Dr. Charlett Blake 10.07.14; forwarded to PCP Martin/SLS

## 2012-12-05 NOTE — Discharge Summary (Signed)
Patient ID: [name] MRN: [id number] DOB/AGE: Jan 22, 1956 57 y.o.  Admit date: 10/24/2012 Discharge date: 12/05/2012  Primary Care Physician:  Leeanne Rio, PA-C  Discharge Diagnoses:   Painful bladder, bilateral reflux  Consults:  None   Discharge Medications:   Medication List    STOP taking these medications       cephALEXin 500 MG capsule  Commonly known as:  KEFLEX     HYDROcodone-acetaminophen 5-325 MG per tablet  Commonly known as:  NORCO/VICODIN     insulin aspart 100 UNIT/ML injection  Commonly known as:  novoLOG     insulin glargine 100 UNIT/ML injection  Commonly known as:  LANTUS     traMADol 50 MG tablet  Commonly known as:  ULTRAM     warfarin 1 MG tablet  Commonly known as:  COUMADIN     warfarin 3 MG tablet  Commonly known as:  COUMADIN      TAKE these medications       atorvastatin 40 MG tablet  Commonly known as:  LIPITOR  Take 40 mg by mouth every evening. Patient takes in the evening     cyclobenzaprine 5 MG tablet  Commonly known as:  FLEXERIL  Take 5 mg by mouth at bedtime.     ferrous sulfate 325 (65 FE) MG tablet  Take 325 mg by mouth 2 (two) times daily.     levothyroxine 200 MCG tablet  Commonly known as:  SYNTHROID, LEVOTHROID  Take 200 mcg by mouth every morning.     lidocaine 2 % jelly  Commonly known as:  XYLOCAINE  Apply topically every 8 (eight) hours as needed (urethral pain).     multivitamins ther. w/minerals Tabs tablet  Take 1 tablet by mouth daily.     phenazopyridine 100 MG tablet  Commonly known as:  PYRIDIUM  Take 100 mg by mouth 3 (three) times daily as needed for pain (for candidiasis).     tamsulosin 0.4 MG Caps capsule  Commonly known as:  FLOMAX  Take 0.4 mg by mouth at bedtime.         Significant Diagnostic Studies:  Ct Abdomen Pelvis Wo Contrast  10/29/2012   *RADIOLOGY REPORT*  Clinical Data: Abdominal pain.  CT ABDOMEN AND PELVIS WITHOUT CONTRAST  Technique:  Multidetector CT  imaging of the abdomen and pelvis was performed following the standard protocol without intravenous contrast.  Comparison: CT of the abdomen and pelvis 10/10/2012.  Findings:  Lung Bases: Atherosclerotic calcifications of the left main, left anterior descending, left circumflex and right coronary arteries.  Abdomen/Pelvis:  Calcified gallstones layering dependently in the gallbladder.  No current findings to suggest acute cholecystitis at this time.  The visualized portions of the unenhanced liver, pancreas, spleen and bilateral adrenal glands are unremarkable.  As with the prior examination there are numerous nonobstructive calculi within the collecting systems of the kidneys bilaterally. The largest of these measures approximately 9 mm in the lower pole collecting system of the right kidney.  There are no calcified ureteral stones or bladder stones.  However, there continues to be diffuse perinephric and severe bilateral periureteric stranding (right greater than left).  As well, there appears to be some mild right-sided hydroureteronephrosis and mild left-sided hydroureter. The overall appearance is very similar to the prior examination from 10/10/2012.  The urinary bladder is nearly completely decompressed with a Foley balloon catheter in place.  A small amount of gas in the nondependent portion of the urinary bladder is presumably iatrogenic.  Numerous colonic diverticula are noted, without surrounding inflammatory changes to suggest acute diverticulitis at this time. No significant volume of ascites.  No pneumoperitoneum.  No pathologic distension of small bowel.  Normal appendix.  No definite pathologic lymphadenopathy identified within the abdomen on today's noncontrast CT examination, although there are numerous reactive size lymph nodes throughout the retroperitoneum. Additionally, there are enlarged lymph nodes along the pelvic side wall bilaterally, largest of which is in the left external iliac nodal  stations measuring approximately 4.1 x 3.5 cm (image 90 of series 2); these are similar to numerous prior examinations, and have actually decreased compared to remote prior study from 07/04/2010, and are presumably benign.  A small left inguinal hernia containing only fat incidentally noted.  Musculoskeletal: Status post bilateral total hip arthroplasty. Small locules of gas are noted in the subcutaneous fat of the anterior abdominal wall, presumably iatrogenic.  There are no aggressive appearing lytic or blastic lesions noted in the visualized portions of the skeleton.  IMPRESSION: 1.  Highly unusual appearance of the ureters and collecting systems of the kidneys bilaterally, similar to the recent prior examination, as above.  Although there are numerous nonobstructive calculi within the collecting systems of the kidneys bilaterally, there are no ureteral stones identified at this time.  Overall, the appearance is favored to represent distal ureteral strictures bilaterally (right greater than left) and/or recurrent upper tract urinary tract infection.  Clinical correlation is recommended. 2.  Cholelithiasis without evidence to suggest acute cholecystitis at this time. 3.  Normal appendix. 4.  Colonic diverticulosis without findings to suggest acute diverticulitis at this time. 5.  Atherosclerosis, including left main and three vessel coronary artery disease.  Please note that although the presence of coronary artery calcium documents the presence of coronary artery disease, the severity of this disease and any potential stenosis cannot be assessed on this non-gated CT examination.  Assessment for potential risk factor modification, dietary therapy or pharmacologic therapy may be warranted, if clinically indicated. 6.  Additional incidental findings, as above.   Original Report Authenticated By: Vinnie Langton, M.D.   Ct Pelvis Wo Contrast  10/29/2012   *RADIOLOGY REPORT*  Clinical Data: Pelvic pain.  Recent  history of bladder stretching. Evaluate for potential bladder perforation.  CT PELVIS WITHOUT CONTRAST  Technique:  Multidetector CT imaging of the pelvis was performed following the standard protocol without intravenous contrast.  Comparison: CT of the abdomen and pelvis without contrast 10/29/2012.  Findings: The bladder is opacified with contrast, with exception of the most nondependent portion which contains a small amount of gas. There is reflux of contrast material into the distal ureters bilaterally (left greater than right). Marked thickening of the urothelium in the ureters bilaterally and extensive periureteric and perinephric stranding bilaterally again noted.  No extravasation of contrast material outside the confines of the urinary bladder are noted. Status post bilateral total hip arthroplasty.  No other significant change compared to tonight's earlier examination.  IMPRESSION: 1.  No findings to suggest bladder rupture.  No significant change from earlier examination obtained tonight.   Original Report Authenticated By: Vinnie Langton, M.D.    Brief H and P: For complete details please refer to admission H and P, but in brief the patient is admitted for cystoscopy, bladder biopsy, retrograde ureteropyelograms.  Hospital Course:  The patient was admitted following the surgical procedure above. He had an uncomplicated postoperative stay and was discharged home on postoperative day #1 with a Foley catheter.  Day of Discharge BP  116/52  Pulse 104  Temp(Src) 98.3 F (36.8 C) (Oral)  Resp 16  Ht 5\' 11"  (1.803 m)  Wt 119 kg (262 lb 5.6 oz)  BMI 36.61 kg/m2  SpO2 94%  No results found for this or any previous visit (from the past 24 hour(s)).  Physical Exam: General: Alert and awake oriented x3 not in any acute distress. HEENT: anicteric sclera, pupils reactive to light and accommodation CVS: S1-S2 clear no murmur rubs or gallops Chest: clear to auscultation bilaterally, no wheezing  rales or rhonchi Abdomen: soft nontender, nondistended, normal bowel sounds, no organomegaly Extremities: no cyanosis, clubbing or edema noted bilaterally Neuro: Cranial nerves II-XII intact, no focal neurological deficits  Disposition:  Home  Diet:  No restrictions, usual diabetic  Activity:  Gradually increase   Disposition and Follow-up:      Future Appointments Provider Department Dept Phone   12/09/2012 3:00 PM Leeanne Rio, PA-C [facility] at  [place]       Followup in office, I will call biopsy results  TESTS THAT NEED FOLLOW-UP  Pathology  DISCHARGE FOLLOW-UP Follow-up Information   Follow up with Jorja Loa, MD. (we'll call you)    Specialty:  Urology   Contact information:   [place] [address] [phone]       Time spent on Discharge:  10 minutes  Signed: Jorja Loa 12/05/2012, 10:32 AM

## 2012-12-06 DIAGNOSIS — G8929 Other chronic pain: Secondary | ICD-10-CM | POA: Diagnosis not present

## 2012-12-06 DIAGNOSIS — E119 Type 2 diabetes mellitus without complications: Secondary | ICD-10-CM | POA: Diagnosis not present

## 2012-12-06 DIAGNOSIS — M1990 Unspecified osteoarthritis, unspecified site: Secondary | ICD-10-CM | POA: Diagnosis not present

## 2012-12-06 DIAGNOSIS — Z466 Encounter for fitting and adjustment of urinary device: Secondary | ICD-10-CM | POA: Diagnosis not present

## 2012-12-09 ENCOUNTER — Encounter: Payer: Self-pay | Admitting: Physician Assistant

## 2012-12-09 ENCOUNTER — Ambulatory Visit (INDEPENDENT_AMBULATORY_CARE_PROVIDER_SITE_OTHER): Payer: Medicare Other | Admitting: Physician Assistant

## 2012-12-09 VITALS — BP 118/72 | HR 110 | Temp 98.3°F | Resp 20 | Ht 71.0 in

## 2012-12-09 DIAGNOSIS — E039 Hypothyroidism, unspecified: Secondary | ICD-10-CM | POA: Diagnosis not present

## 2012-12-09 DIAGNOSIS — Z299 Encounter for prophylactic measures, unspecified: Secondary | ICD-10-CM

## 2012-12-09 DIAGNOSIS — E785 Hyperlipidemia, unspecified: Secondary | ICD-10-CM

## 2012-12-09 DIAGNOSIS — G8929 Other chronic pain: Secondary | ICD-10-CM | POA: Diagnosis not present

## 2012-12-09 DIAGNOSIS — Z7901 Long term (current) use of anticoagulants: Secondary | ICD-10-CM

## 2012-12-09 DIAGNOSIS — R6889 Other general symptoms and signs: Secondary | ICD-10-CM

## 2012-12-09 DIAGNOSIS — R899 Unspecified abnormal finding in specimens from other organs, systems and tissues: Secondary | ICD-10-CM

## 2012-12-09 LAB — CBC WITH DIFFERENTIAL/PLATELET
Hemoglobin: 8.7 g/dL — ABNORMAL LOW (ref 13.0–17.0)
Lymphocytes Relative: 15 % (ref 12–46)
Lymphs Abs: 0.6 10*3/uL — ABNORMAL LOW (ref 0.7–4.0)
Monocytes Relative: 9 % (ref 3–12)
Neutro Abs: 3.2 10*3/uL (ref 1.7–7.7)
Neutrophils Relative %: 73 % (ref 43–77)
RBC: 3.3 MIL/uL — ABNORMAL LOW (ref 4.22–5.81)

## 2012-12-09 LAB — PROTIME-INR: Prothrombin Time: 20.7 seconds — ABNORMAL HIGH (ref 11.6–15.2)

## 2012-12-09 MED ORDER — LEVOTHYROXINE SODIUM 200 MCG PO TABS: 200.0000 ug | ORAL_TABLET | Freq: Every morning | ORAL | Status: DC

## 2012-12-09 MED ORDER — LEVOTHYROXINE SODIUM 25 MCG PO TABS: 25.0000 ug | ORAL_TABLET | Freq: Every day | ORAL | Status: DC

## 2012-12-09 MED ORDER — HYDROCODONE-ACETAMINOPHEN 5-325 MG PO TABS: 1.0000 | ORAL_TABLET | Freq: Four times a day (QID) | ORAL | Status: DC | PRN

## 2012-12-09 NOTE — Assessment & Plan Note (Addendum)
Repeat PT/INR.

## 2012-12-09 NOTE — Patient Instructions (Addendum)
Please follow-up with your urologist.  Please obtain labs.  I will call you with your results.  Continue medications as prescribed for now.  We will set-up follow-up pending your lab results.  Please take 1 tablet of coumadin (2 mg) each day, except for Saturday and Tuesday.  On those days take 1/2 tablet (1 pill).

## 2012-12-09 NOTE — Telephone Encounter (Signed)
Received fax from [facility] with patient's PT/INR levels and new dosing instructions that were given via Dr. Sherral Hammers did not receive this information]; responded with note: please forward all patient's orders, results, etc to PCP Brunetta Jeans, PA-C], as your facility requires co-sign by Dr. Reatha Armour

## 2012-12-09 NOTE — Assessment & Plan Note (Addendum)
Repeat TSH. Refill levothyroxine

## 2012-12-09 NOTE — Telephone Encounter (Signed)
Pt in office for visit 10.13.14 at 3:00p/SLS

## 2012-12-09 NOTE — Assessment & Plan Note (Signed)
CBC, CMP 

## 2012-12-09 NOTE — Progress Notes (Signed)
Patient ID: [name], male   DOB: 08-14-1955, 57 y.o.   MRN: [id number]  Patient presents to clinic today for follow-up of hypothyroidism and monitoring of coumadin therapy.  Patient endorses taking 225 mcg of levothyroxine as changed at last visit.  Denies adverse reaction to dose change.  Denies change in weight, heat/cold intolerance, bowel habits or appetite.  Needs refill of medication.  Patient taking coumadin as prescribed after home PT/INR check by home health.  PT/INR WNL at last check.  Patient instructed to take coumadin 2 mg tablet every day except Saturday and Tuesday, on which he is to take 1 mg (1/2 tablet).  Patient states he did not receive this message.  There is some question to the validity of the patient's claims that he has not been contacted.  Nursing staff has reported calling home multiple times with multiple messages.  Patient states that he does not have time to return our calls.  Patient wants to have coumadin checked at home using his own machine.  I am not agreeable to this.  Patient refuses to go to our coumadin clinics because he states he cannot travel that far.  I am agreeable to patient having labs drawn at home while he has order from hospital for home health.    Patient requests refill of vicodin for bladder pain and arthritis.  I am amenable to this given recent history but no additional refills will be given by our office without a contract being signed and/or further workup.     Past Medical History  Diagnosis Date  . Sleep apnea     uses cpap  . Hypothyroidism   . Anemia   . Blood transfusion   . Chronic kidney disease     hx of kidney stones  . Hypertension   . Arthritis     osteoarthritis  . Pneumonia   . Diabetes mellitus     insulin dependent  . DVT (deep vein thrombosis) in pregnancy   . Peripheral vascular disease   . Hyperlipidemia   . Morbid obesity   . Hypertrophy of prostate   . Acute respiratory failure   . Cellulitis and  abscess of leg   . Chronic kidney disease, stage III (moderate)   . Nephrolithiasis   . Anxiety   . Chronic pain     Current Outpatient Prescriptions on File Prior to Visit  Medication Sig Dispense Refill  . atorvastatin (LIPITOR) 40 MG tablet Take 40 mg by mouth every evening. Patient takes in the evening      . cyclobenzaprine (FLEXERIL) 5 MG tablet Take 5 mg by mouth at bedtime.      . docusate sodium 100 MG CAPS Take 100 mg by mouth 2 (two) times daily.      . ferrous sulfate 325 (65 FE) MG tablet Take 325 mg by mouth 2 (two) times daily.       . insulin aspart (NOVOLOG) 100 UNIT/ML injection Inject 3 Units into the skin 3 (three) times daily with meals. And q HS for BS> 150.  1 vial    . insulin glargine (LANTUS) 100 UNIT/ML injection Inject 0.15 mLs (15 Units total) into the skin at bedtime. PATIENT REPORTS THAT HE TAKES 40 UNITS DAILY.  10 mL  1  . Insulin Syringe-Needle U-100 (INSULIN SYRINGE .5CC/31GX5/16") 31G X 5/16" 0.5 ML MISC USE AS DIRECTED TO INJECT INSULIN FOUR TIMES DAILY Dx: 250.00  120 each  5  . lidocaine (XYLOCAINE) 2 % jelly Apply  topically every 8 (eight) hours as needed (urethral pain).  30 mL  0  . Multiple Vitamins-Minerals (MULTIVITAMINS THER. W/MINERALS) TABS Take 1 tablet by mouth daily.       . phenazopyridine (PYRIDIUM) 100 MG tablet Take 100 mg by mouth 3 (three) times daily as needed for pain (for candidiasis).      . tamsulosin (FLOMAX) 0.4 MG CAPS capsule Take 0.4 mg by mouth at bedtime.       Marland Kitchen warfarin (COUMADIN) 2 MG tablet Take 1 tablet (2 mg total) by mouth daily at 6 PM.  30 tablet  0   No current facility-administered medications on file prior to visit.    No Known Allergies  Family History  Problem Relation Age of Onset  . Diabetes Mother   . Colon cancer Father   . Dementia Father   . Heart attack Father   . Heart attack Mother   . Stroke Mother     History   Social History  . Marital Status: Married    Spouse Name: N/A    Number  of Children: N/A  . Years of Education: N/A   Social History Main Topics  . Smoking status: Never Smoker   . Smokeless tobacco: Never Used  . Alcohol Use: No  . Drug Use: No  . Sexual Activity: Yes   Other Topics Concern  . None   Social History Narrative   Lives with his wife and uses a wheelchair for transfers.     Review of Systems  Constitutional: Negative for fever, chills, weight loss and malaise/fatigue.  Eyes: Negative for blurred vision.  Respiratory: Negative for shortness of breath.   Cardiovascular: Negative for chest pain and palpitations.  Genitourinary: Positive for dysuria and urgency. Negative for hematuria and flank pain.       Symptoms chronic -- Managed by urology  Musculoskeletal: Positive for joint pain.  Neurological: Negative for headaches.   Filed Vitals:   12/09/12 1504  BP: 118/72  Pulse: 110  Temp: 98.3 F (36.8 C)  Resp: 20   Physical Exam  Vitals reviewed. Constitutional: He is oriented to person, place, and time and well-developed, well-nourished, and in no distress.  HENT:  Head: Normocephalic and atraumatic.  Eyes: Conjunctivae are normal.  Neck: Neck supple.  Cardiovascular: Normal rate, regular rhythm, normal heart sounds and intact distal pulses.   Pulmonary/Chest: Effort normal and breath sounds normal. No respiratory distress. He has no wheezes. He has no rales. He exhibits no tenderness.  Neurological: He is alert and oriented to person, place, and time.  Skin: Skin is warm and dry. No rash noted.     Recent Results (from the past 2160 hour(s))  BASIC METABOLIC PANEL     Status: Abnormal   Collection Time    09/17/12  8:03 PM      Result Value Range   Sodium 133 (*) 135 - 145 mEq/L   Potassium 4.3  3.5 - 5.1 mEq/L   Comment: HEMOLYSIS AT THIS LEVEL MAY AFFECT RESULT   Chloride 99  96 - 112 mEq/L   CO2 22  19 - 32 mEq/L   Glucose, Bld 110 (*) 70 - 99 mg/dL   BUN 32 (*) 6 - 23 mg/dL   Creatinine, Ser 1.78 (*) 0.50 - 1.35  mg/dL   Calcium 9.1  8.4 - 10.5 mg/dL   GFR calc non Af Amer 42 (*) >90 mL/min   GFR calc Af Amer 48 (*) >90 mL/min   Comment:  The eGFR has been calculated     using the CKD EPI equation.     This calculation has not been     validated in all clinical     situations.     eGFR's persistently     <90 mL/min signify     possible Chronic Kidney Disease.  CBC WITH DIFFERENTIAL     Status: Abnormal   Collection Time    09/17/12  8:03 PM      Result Value Range   WBC 7.7  4.0 - 10.5 K/uL   RBC 3.66 (*) 4.22 - 5.81 MIL/uL   Hemoglobin 10.6 (*) 13.0 - 17.0 g/dL   HCT 31.7 (*) 39.0 - 52.0 %   MCV 86.6  78.0 - 100.0 fL   MCH 29.0  26.0 - 34.0 pg   MCHC 33.4  30.0 - 36.0 g/dL   RDW 15.8 (*) 11.5 - 15.5 %   Platelets 239  150 - 400 K/uL   Neutrophils Relative % 77  43 - 77 %   Neutro Abs 6.0  1.7 - 7.7 K/uL   Lymphocytes Relative 12  12 - 46 %   Lymphs Abs 0.9  0.7 - 4.0 K/uL   Monocytes Relative 8  3 - 12 %   Monocytes Absolute 0.6  0.1 - 1.0 K/uL   Eosinophils Relative 2  0 - 5 %   Eosinophils Absolute 0.2  0.0 - 0.7 K/uL   Basophils Relative 1  0 - 1 %   Basophils Absolute 0.0  0.0 - 0.1 K/uL  GLUCOSE, CAPILLARY     Status: Abnormal   Collection Time    09/17/12  8:12 PM      Result Value Range   Glucose-Capillary 108 (*) 70 - 99 mg/dL  URINALYSIS, ROUTINE W REFLEX MICROSCOPIC     Status: Abnormal   Collection Time    09/17/12 11:02 PM      Result Value Range   Color, Urine AMBER (*) YELLOW   Comment: BIOCHEMICALS MAY BE AFFECTED BY COLOR   APPearance TURBID (*) CLEAR   Specific Gravity, Urine 1.014  1.005 - 1.030   pH 6.0  5.0 - 8.0   Glucose, UA NEGATIVE  NEGATIVE mg/dL   Hgb urine dipstick LARGE (*) NEGATIVE   Bilirubin Urine NEGATIVE  NEGATIVE   Ketones, ur NEGATIVE  NEGATIVE mg/dL   Protein, ur 100 (*) NEGATIVE mg/dL   Urobilinogen, UA 1.0  0.0 - 1.0 mg/dL   Nitrite NEGATIVE  NEGATIVE   Leukocytes, UA LARGE (*) NEGATIVE  URINE MICROSCOPIC-ADD ON      Status: Abnormal   Collection Time    09/17/12 11:02 PM      Result Value Range   Squamous Epithelial / LPF RARE  RARE   WBC, UA TOO NUMEROUS TO COUNT  <3 WBC/hpf   RBC / HPF 11-20  <3 RBC/hpf   Bacteria, UA FEW (*) RARE  URINE CULTURE     Status: None   Collection Time    09/17/12 11:02 PM      Result Value Range   Specimen Description URINE, CLEAN CATCH     Special Requests NONE     Culture  Setup Time 09/18/2012 00:09     [facility] Count NO GROWTH     Culture NO GROWTH     Report Status 09/19/2012 FINAL    COMPREHENSIVE METABOLIC PANEL     Status: Abnormal   Collection Time    10/10/12  1:06 PM  Result Value Range   Sodium 133 (*) 135 - 145 mEq/L   Potassium 3.3 (*) 3.5 - 5.1 mEq/L   Chloride 102  96 - 112 mEq/L   CO2 21  19 - 32 mEq/L   Glucose, Bld 155 (*) 70 - 99 mg/dL   BUN 21  6 - 23 mg/dL   Creatinine, Ser 1.75 (*) 0.50 - 1.35 mg/dL   Calcium 9.1  8.4 - 10.5 mg/dL   Total Protein 6.6  6.0 - 8.3 g/dL   Albumin 2.7 (*) 3.5 - 5.2 g/dL   AST 9  0 - 37 U/L   ALT 8  0 - 53 U/L   Alkaline Phosphatase 103  39 - 117 U/L   Total Bilirubin 0.6  0.3 - 1.2 mg/dL   GFR calc non Af Amer 42 (*) >90 mL/min   GFR calc Af Amer 49 (*) >90 mL/min   Comment: (NOTE)     The eGFR has been calculated using the CKD EPI equation.     This calculation has not been validated in all clinical situations.     eGFR's persistently <90 mL/min signify possible Chronic Kidney     Disease.  CBC WITH DIFFERENTIAL     Status: Abnormal   Collection Time    10/10/12  1:06 PM      Result Value Range   WBC 5.4  4.0 - 10.5 K/uL   RBC 3.83 (*) 4.22 - 5.81 MIL/uL   Hemoglobin 10.2 (*) 13.0 - 17.0 g/dL   HCT 32.5 (*) 39.0 - 52.0 %   MCV 84.9  78.0 - 100.0 fL   MCH 26.6  26.0 - 34.0 pg   MCHC 31.4  30.0 - 36.0 g/dL   RDW 15.5  11.5 - 15.5 %   Platelets 209  150 - 400 K/uL   Neutrophils Relative % 73  43 - 77 %   Neutro Abs 3.9  1.7 - 7.7 K/uL   Lymphocytes Relative 15  12 - 46 %   Lymphs Abs 0.8   0.7 - 4.0 K/uL   Monocytes Relative 9  3 - 12 %   Monocytes Absolute 0.5  0.1 - 1.0 K/uL   Eosinophils Relative 3  0 - 5 %   Eosinophils Absolute 0.2  0.0 - 0.7 K/uL   Basophils Relative 0  0 - 1 %   Basophils Absolute 0.0  0.0 - 0.1 K/uL  PROTIME-INR     Status: Abnormal   Collection Time    10/10/12  1:06 PM      Result Value Range   Prothrombin Time 21.6 (*) 11.6 - 15.2 seconds   INR 1.94 (*) 0.00 - 1.49  LIPASE, BLOOD     Status: None   Collection Time    10/10/12  1:06 PM      Result Value Range   Lipase 11  11 - 59 U/L  URINALYSIS, ROUTINE W REFLEX MICROSCOPIC     Status: Abnormal   Collection Time    10/10/12  2:38 PM      Result Value Range   Color, Urine ORANGE (*) YELLOW   Comment: BIOCHEMICALS MAY BE AFFECTED BY COLOR   APPearance TURBID (*) CLEAR   Specific Gravity, Urine 1.020  1.005 - 1.030   pH 6.0  5.0 - 8.0   Glucose, UA NEGATIVE  NEGATIVE mg/dL   Hgb urine dipstick LARGE (*) NEGATIVE   Bilirubin Urine SMALL (*) NEGATIVE   Ketones, ur NEGATIVE  NEGATIVE mg/dL  Protein, ur 100 (*) NEGATIVE mg/dL   Urobilinogen, UA 1.0  0.0 - 1.0 mg/dL   Nitrite POSITIVE (*) NEGATIVE   Leukocytes, UA LARGE (*) NEGATIVE  URINE MICROSCOPIC-ADD ON     Status: Abnormal   Collection Time    10/10/12  2:38 PM      Result Value Range   Squamous Epithelial / LPF FEW (*) RARE   WBC, UA TOO NUMEROUS TO COUNT  <3 WBC/hpf   RBC / HPF 11-20  <3 RBC/hpf   Bacteria, UA FEW (*) RARE  URINE CULTURE     Status: None   Collection Time    10/10/12  2:38 PM      Result Value Range   Specimen Description URINE, CLEAN CATCH     Special Requests NONE     Culture  Setup Time       Value: 10/10/2012 23:30     Performed at [facility]       Value: NO GROWTH     Performed at [facility]   Culture       Value: NO GROWTH     Performed at [facility]   Report Status 10/11/2012 FINAL    PROTIME-INR     Status: Abnormal   Collection Time     10/24/12  9:15 AM      Result Value Range   Prothrombin Time 24.1 (*) 11.6 - 15.2 seconds   INR 2.24 (*) 0.00 - 1.49  APTT     Status: Abnormal   Collection Time    10/24/12  9:15 AM      Result Value Range   aPTT 50 (*) 24 - 37 seconds   Comment:            IF BASELINE aPTT IS ELEVATED,     SUGGEST PATIENT RISK ASSESSMENT     BE USED TO DETERMINE APPROPRIATE     ANTICOAGULANT THERAPY.  SURGICAL PCR SCREEN     Status: None   Collection Time    10/24/12  9:15 AM      Result Value Range   MRSA, PCR NEGATIVE  NEGATIVE   Staphylococcus aureus NEGATIVE  NEGATIVE   Comment:            The Xpert SA Assay (FDA     approved for NASAL specimens     in patients over 35 years of age),     is one component of     a comprehensive surveillance     program.  Test performance has     been validated by [employer] for patients greater     than or equal to 18 year old.     It is not intended     to diagnose infection nor to     guide or monitor treatment.  GLUCOSE, CAPILLARY     Status: Abnormal   Collection Time    10/24/12  9:22 AM      Result Value Range   Glucose-Capillary 102 (*) 70 - 99 mg/dL  URINE CULTURE     Status: None   Collection Time    10/24/12 11:40 AM      Result Value Range   Specimen Description URINE, CLEAN CATCH     Special Requests PATIENT ON FOLLOWING GENTAMICIN 650MG      Culture  Setup Time       Value: 10/24/2012 15:08     Performed at [facility]   [facility] Count       Value: NO GROWTH     Performed at [facility]   Culture       Value: NO GROWTH     Performed at [facility]   Report Status 10/25/2012 FINAL    GLUCOSE, CAPILLARY     Status: Abnormal   Collection Time    10/24/12 12:15 PM      Result Value Range   Glucose-Capillary 120 (*) 70 - 99 mg/dL  GLUCOSE, CAPILLARY     Status: Abnormal   Collection Time    10/24/12  5:23 PM      Result Value Range   Glucose-Capillary 110 (*) 70 - 99 mg/dL  BASIC METABOLIC PANEL      Status: Abnormal   Collection Time    10/24/12  6:05 PM      Result Value Range   Sodium 136  135 - 145 mEq/L   Potassium 3.7  3.5 - 5.1 mEq/L   Chloride 106  96 - 112 mEq/L   CO2 23  19 - 32 mEq/L   Glucose, Bld 142 (*) 70 - 99 mg/dL   BUN 24 (*) 6 - 23 mg/dL   Creatinine, Ser 1.90 (*) 0.50 - 1.35 mg/dL   Calcium 8.6  8.4 - 10.5 mg/dL   GFR calc non Af Amer 38 (*) >90 mL/min   GFR calc Af Amer 45 (*) >90 mL/min   Comment: (NOTE)     The eGFR has been calculated using the CKD EPI equation.     This calculation has not been validated in all clinical situations.     eGFR's persistently <90 mL/min signify possible Chronic Kidney     Disease.  GLUCOSE, CAPILLARY     Status: Abnormal   Collection Time    10/24/12  9:40 PM      Result Value Range   Glucose-Capillary 203 (*) 70 - 99 mg/dL   Comment 1 Documented in Chart     Comment 2 Notify RN    GLUCOSE, CAPILLARY     Status: Abnormal   Collection Time    10/25/12  7:44 AM      Result Value Range   Glucose-Capillary 162 (*) 70 - 99 mg/dL  GLUCOSE, CAPILLARY     Status: Abnormal   Collection Time    10/25/12 11:10 AM      Result Value Range   Glucose-Capillary 213 (*) 70 - 99 mg/dL  CBC WITH DIFFERENTIAL     Status: Abnormal   Collection Time    10/29/12  4:42 PM      Result Value Range   WBC 5.6  4.0 - 10.5 K/uL   RBC 2.89 (*) 4.22 - 5.81 MIL/uL   Hemoglobin 7.6 (*) 13.0 - 17.0 g/dL   HCT 24.4 (*) 39.0 - 52.0 %   MCV 84.4  78.0 - 100.0 fL   MCH 26.3  26.0 - 34.0 pg   MCHC 31.1  30.0 - 36.0 g/dL   RDW 16.7 (*) 11.5 - 15.5 %   Platelets 187  150 - 400 K/uL   Neutrophils Relative % 77  43 - 77 %   Neutro Abs 4.3  1.7 - 7.7 K/uL   Lymphocytes Relative 10 (*) 12 - 46 %   Lymphs Abs 0.6 (*) 0.7 - 4.0 K/uL   Monocytes Relative 11  3 - 12 %   Monocytes Absolute 0.6  0.1 - 1.0 K/uL  Eosinophils Relative 2  0 - 5 %   Eosinophils Absolute 0.1  0.0 - 0.7 K/uL   Basophils Relative 0  0 - 1 %   Basophils Absolute 0.0  0.0 -  0.1 K/uL  COMPREHENSIVE METABOLIC PANEL     Status: Abnormal   Collection Time    10/29/12  4:42 PM      Result Value Range   Sodium 132 (*) 135 - 145 mEq/L   Potassium 4.5  3.5 - 5.1 mEq/L   Chloride 102  96 - 112 mEq/L   CO2 18 (*) 19 - 32 mEq/L   Glucose, Bld 185 (*) 70 - 99 mg/dL   BUN 71 (*) 6 - 23 mg/dL   Creatinine, Ser 5.09 (*) 0.50 - 1.35 mg/dL   Calcium 8.2 (*) 8.4 - 10.5 mg/dL   Total Protein 6.0  6.0 - 8.3 g/dL   Albumin 2.3 (*) 3.5 - 5.2 g/dL   AST 10  0 - 37 U/L   ALT 10  0 - 53 U/L   Alkaline Phosphatase 102  39 - 117 U/L   Total Bilirubin 0.4  0.3 - 1.2 mg/dL   GFR calc non Af Amer 12 (*) >90 mL/min   GFR calc Af Amer 14 (*) >90 mL/min   Comment: (NOTE)     The eGFR has been calculated using the CKD EPI equation.     This calculation has not been validated in all clinical situations.     eGFR's persistently <90 mL/min signify possible Chronic Kidney     Disease.  TYPE AND SCREEN     Status: None   Collection Time    10/29/12  4:45 PM      Result Value Range   ABO/RH(D) O POS     Antibody Screen NEG     Sample Expiration 11/01/2012     Unit Number [id number]     Blood Component Type RED CELLS,LR     Unit division 00     Status of Unit ISSUED,FINAL     Transfusion Status OK TO TRANSFUSE     Crossmatch Result Compatible     Unit Number [id number]     Blood Component Type RED CELLS,LR     Unit division 00     Status of Unit ISSUED,FINAL     Transfusion Status OK TO TRANSFUSE     Crossmatch Result Compatible    CG4 I-STAT (LACTIC ACID)     Status: None   Collection Time    10/29/12  5:30 PM      Result Value Range   Lactic Acid, Venous 1.66  0.5 - 2.2 mmol/L  URINALYSIS, ROUTINE W REFLEX MICROSCOPIC     Status: Abnormal   Collection Time    10/29/12  6:05 PM      Result Value Range   Color, Urine RED (*) YELLOW   Comment: BIOCHEMICALS MAY BE AFFECTED BY COLOR   APPearance TURBID (*) CLEAR   Specific Gravity, Urine 1.031 (*) 1.005 - 1.030   pH  5.0  5.0 - 8.0   Glucose, UA NEGATIVE  NEGATIVE mg/dL   Hgb urine dipstick LARGE (*) NEGATIVE   Bilirubin Urine LARGE (*) NEGATIVE   Ketones, ur 15 (*) NEGATIVE mg/dL   Protein, ur >300 (*) NEGATIVE mg/dL   Urobilinogen, UA 1.0  0.0 - 1.0 mg/dL   Nitrite POSITIVE (*) NEGATIVE   Leukocytes, UA LARGE (*) NEGATIVE  URINE MICROSCOPIC-ADD ON     Status: None  Collection Time    10/29/12  6:05 PM      Result Value Range   Urine-Other URINALYSIS PERFORMED ON SUPERNATANT     Comment: FIELD OBSCURED BY RBC'S  APTT     Status: Abnormal   Collection Time    10/29/12  6:18 PM      Result Value Range   aPTT 59 (*) 24 - 37 seconds   Comment:            IF BASELINE aPTT IS ELEVATED,     SUGGEST PATIENT RISK ASSESSMENT     BE USED TO DETERMINE APPROPRIATE     ANTICOAGULANT THERAPY.  PROTIME-INR     Status: Abnormal   Collection Time    10/29/12  6:18 PM      Result Value Range   Prothrombin Time 30.5 (*) 11.6 - 15.2 seconds   INR 3.06 (*) 0.00 - 1.49  PREPARE RBC (CROSSMATCH)     Status: None   Collection Time    10/29/12  6:30 PM      Result Value Range   Order Confirmation ORDER PROCESSED BY BLOOD BANK    MRSA PCR SCREENING     Status: None   Collection Time    10/29/12 10:25 PM      Result Value Range   MRSA by PCR NEGATIVE  NEGATIVE   Comment:            The GeneXpert MRSA Assay (FDA     approved for NASAL specimens     only), is one component of a     comprehensive MRSA colonization     surveillance program. It is not     intended to diagnose MRSA     infection nor to guide or     monitor treatment for     MRSA infections.  CULTURE, BLOOD (ROUTINE X 2)     Status: None   Collection Time    10/29/12 11:05 PM      Result Value Range   Specimen Description BLOOD LEFT HAND     Special Requests BOTTLES DRAWN AEROBIC ONLY 2CC     Culture  Setup Time       Value: 10/30/2012 05:21     Performed at [facility]   Culture       Value: NO GROWTH 5 DAYS     Performed  at [facility]   Report Status 11/05/2012 FINAL    GLUCOSE, CAPILLARY     Status: Abnormal   Collection Time    10/29/12 11:10 PM      Result Value Range   Glucose-Capillary 120 (*) 70 - 99 mg/dL  CULTURE, BLOOD (ROUTINE X 2)     Status: None   Collection Time    10/29/12 11:11 PM      Result Value Range   Specimen Description BLOOD RIGHT ANTECUBITAL     Special Requests BOTTLES DRAWN AEROBIC AND ANAEROBIC 3CC     Culture  Setup Time       Value: 10/30/2012 05:21     Performed at [facility]   Culture       Value: NO GROWTH 5 DAYS     Performed at [facility]   Report Status 11/05/2012 FINAL    CORTISOL     Status: None   Collection Time    10/29/12 11:11 PM      Result Value Range   Cortisol, Plasma 8.6     Comment: (NOTE)  AM:  4.3 - 22.4 ug/dL     PM:  3.1 - 16.7 ug/dL     Performed at [facility]  TROPONIN I     Status: None   Collection Time    10/29/12 11:11 PM      Result Value Range   Troponin I <0.30  <0.30 ng/mL   Comment:            Due to the release kinetics of cTnI,     a negative result within the first hours     of the onset of symptoms does not rule out     myocardial infarction with certainty.     If myocardial infarction is still suspected,     repeat the test at appropriate intervals.  LIPID PANEL     Status: Abnormal   Collection Time    10/29/12 11:11 PM      Result Value Range   Cholesterol 100  0 - 200 mg/dL   Triglycerides 226 (*) <150 mg/dL   HDL 12 (*) >39 mg/dL   Total CHOL/HDL Ratio 8.3     VLDL 45 (*) 0 - 40 mg/dL   LDL Cholesterol 43  0 - 99 mg/dL   Comment:            Total Cholesterol/HDL:CHD Risk     Coronary Heart Disease Risk Table                         Men   Women      1/2 Average Risk   3.4   3.3      Average Risk       5.0   4.4      2 X Average Risk   9.6   7.1      3 X Average Risk  23.4   11.0                Use the calculated Patient Ratio     above and the CHD Risk Table      to determine the patient's CHD Risk.                ATP III CLASSIFICATION (LDL):      <100     mg/dL   Optimal      100-129  mg/dL   Near or Above                        Optimal      130-159  mg/dL   Borderline      160-189  mg/dL   High      >190     mg/dL   Very High     Performed at [facility]  SODIUM, URINE, RANDOM     Status: None   Collection Time    10/30/12  1:37 AM      Result Value Range   Sodium, Ur 89     Comment: Performed at [place], URINE, RANDOM     Status: None   Collection Time    10/30/12  1:37 AM      Result Value Range   Creatinine, Urine 58.1     Comment: Performed at [place] [facility]     Status: Abnormal   Collection Time    10/30/12  5:20 AM      Result Value Range  Sodium 134 (*) 135 - 145 mEq/L   Potassium 4.1  3.5 - 5.1 mEq/L   Chloride 108  96 - 112 mEq/L   CO2 17 (*) 19 - 32 mEq/L   Glucose, Bld 135 (*) 70 - 99 mg/dL   BUN 65 (*) 6 - 23 mg/dL   Creatinine, Ser 4.25 (*) 0.50 - 1.35 mg/dL   Calcium 7.5 (*) 8.4 - 10.5 mg/dL   GFR calc non Af Amer 15 (*) >90 mL/min   GFR calc Af Amer 17 (*) >90 mL/min   Comment: (NOTE)     The eGFR has been calculated using the CKD EPI equation.     This calculation has not been validated in all clinical situations.     eGFR's persistently <90 mL/min signify possible Chronic Kidney     Disease.  CBC     Status: Abnormal   Collection Time    10/30/12  5:20 AM      Result Value Range   WBC 4.8  4.0 - 10.5 K/uL   RBC 3.32 (*) 4.22 - 5.81 MIL/uL   Hemoglobin 8.8 (*) 13.0 - 17.0 g/dL   HCT 27.7 (*) 39.0 - 52.0 %   MCV 83.4  78.0 - 100.0 fL   MCH 26.5  26.0 - 34.0 pg   MCHC 31.8  30.0 - 36.0 g/dL   RDW 17.7 (*) 11.5 - 15.5 %   Platelets 153  150 - 400 K/uL  TROPONIN I     Status: None   Collection Time    10/30/12  5:20 AM      Result Value Range   Troponin I <0.30  <0.30 ng/mL   Comment:            Due to the release kinetics of cTnI,      a negative result within the first hours     of the onset of symptoms does not rule out     myocardial infarction with certainty.     If myocardial infarction is still suspected,     repeat the test at appropriate intervals.  GLUCOSE, CAPILLARY     Status: Abnormal   Collection Time    10/30/12  8:08 AM      Result Value Range   Glucose-Capillary 118 (*) 70 - 99 mg/dL  TROPONIN I     Status: None   Collection Time    10/30/12 10:50 AM      Result Value Range   Troponin I <0.30  <0.30 ng/mL   Comment:            Due to the release kinetics of cTnI,     a negative result within the first hours     of the onset of symptoms does not rule out     myocardial infarction with certainty.     If myocardial infarction is still suspected,     repeat the test at appropriate intervals.  GLUCOSE, CAPILLARY     Status: Abnormal   Collection Time    10/30/12 12:11 PM      Result Value Range   Glucose-Capillary 121 (*) 70 - 99 mg/dL  URINE CULTURE     Status: None   Collection Time    10/30/12 12:30 PM      Result Value Range   Specimen Description URINE, CATHETERIZED     Special Requests NONE     Culture  Setup Time       Value: 10/30/2012 15:34  Performed at [facility]       Value: NO GROWTH     Performed at [facility]   Culture       Value: NO GROWTH     Performed at [facility] Insurance   Report Status 10/31/2012 FINAL    GLUCOSE, CAPILLARY     Status: None   Collection Time    10/30/12  5:00 PM      Result Value Range   Glucose-Capillary 92  70 - 99 mg/dL  GLUCOSE, CAPILLARY     Status: Abnormal   Collection Time    10/30/12  9:52 PM      Result Value Range   Glucose-Capillary 129 (*) 70 - 99 mg/dL  BASIC METABOLIC PANEL     Status: Abnormal   Collection Time    10/31/12  3:35 AM      Result Value Range   Sodium 135  135 - 145 mEq/L   Potassium 4.2  3.5 - 5.1 mEq/L   Chloride 109  96 - 112 mEq/L   CO2 18 (*) 19 - 32 mEq/L    Glucose, Bld 163 (*) 70 - 99 mg/dL   BUN 61 (*) 6 - 23 mg/dL   Creatinine, Ser 3.62 (*) 0.50 - 1.35 mg/dL   Calcium 7.8 (*) 8.4 - 10.5 mg/dL   GFR calc non Af Amer 18 (*) >90 mL/min   GFR calc Af Amer 20 (*) >90 mL/min   Comment: (NOTE)     The eGFR has been calculated using the CKD EPI equation.     This calculation has not been validated in all clinical situations.     eGFR's persistently <90 mL/min signify possible Chronic Kidney     Disease.  CBC     Status: Abnormal   Collection Time    10/31/12  3:35 AM      Result Value Range   WBC 4.3  4.0 - 10.5 K/uL   RBC 3.41 (*) 4.22 - 5.81 MIL/uL   Hemoglobin 8.8 (*) 13.0 - 17.0 g/dL   HCT 28.5 (*) 39.0 - 52.0 %   MCV 83.6  78.0 - 100.0 fL   MCH 25.8 (*) 26.0 - 34.0 pg   MCHC 30.9  30.0 - 36.0 g/dL   RDW 18.0 (*) 11.5 - 15.5 %   Platelets 165  150 - 400 K/uL  PROTIME-INR     Status: Abnormal   Collection Time    10/31/12  3:35 AM      Result Value Range   Prothrombin Time 30.4 (*) 11.6 - 15.2 seconds   INR 3.04 (*) 0.00 - 1.49  GLUCOSE, CAPILLARY     Status: Abnormal   Collection Time    10/31/12  7:55 AM      Result Value Range   Glucose-Capillary 142 (*) 70 - 99 mg/dL  GLUCOSE, CAPILLARY     Status: Abnormal   Collection Time    10/31/12 12:33 PM      Result Value Range   Glucose-Capillary 173 (*) 70 - 99 mg/dL  GLUCOSE, CAPILLARY     Status: Abnormal   Collection Time    10/31/12  5:41 PM      Result Value Range   Glucose-Capillary 168 (*) 70 - 99 mg/dL  GLUCOSE, CAPILLARY     Status: Abnormal   Collection Time    10/31/12  9:35 PM      Result Value Range   Glucose-Capillary 217 (*)  70 - 99 mg/dL   Comment 1 Notify RN    BASIC METABOLIC PANEL     Status: Abnormal   Collection Time    11/01/12  4:10 AM      Result Value Range   Sodium 133 (*) 135 - 145 mEq/L   Potassium 4.0  3.5 - 5.1 mEq/L   Chloride 109  96 - 112 mEq/L   CO2 16 (*) 19 - 32 mEq/L   Glucose, Bld 204 (*) 70 - 99 mg/dL   BUN 47 (*) 6 - 23 mg/dL    Creatinine, Ser 2.61 (*) 0.50 - 1.35 mg/dL   Calcium 8.0 (*) 8.4 - 10.5 mg/dL   GFR calc non Af Amer 26 (*) >90 mL/min   GFR calc Af Amer 30 (*) >90 mL/min   Comment: (NOTE)     The eGFR has been calculated using the CKD EPI equation.     This calculation has not been validated in all clinical situations.     eGFR's persistently <90 mL/min signify possible Chronic Kidney     Disease.  CBC     Status: Abnormal   Collection Time    11/01/12  4:10 AM      Result Value Range   WBC 4.3  4.0 - 10.5 K/uL   RBC 3.51 (*) 4.22 - 5.81 MIL/uL   Hemoglobin 9.1 (*) 13.0 - 17.0 g/dL   HCT 29.0 (*) 39.0 - 52.0 %   MCV 82.6  78.0 - 100.0 fL   MCH 25.9 (*) 26.0 - 34.0 pg   MCHC 31.4  30.0 - 36.0 g/dL   RDW 17.5 (*) 11.5 - 15.5 %   Platelets 179  150 - 400 K/uL  PROTIME-INR     Status: Abnormal   Collection Time    11/01/12  4:10 AM      Result Value Range   Prothrombin Time 22.8 (*) 11.6 - 15.2 seconds   INR 2.09 (*) 0.00 - 1.49  GLUCOSE, CAPILLARY     Status: Abnormal   Collection Time    11/01/12  7:47 AM      Result Value Range   Glucose-Capillary 151 (*) 70 - 99 mg/dL   Comment 1 Documented in Chart     Comment 2 Notify RN    GLUCOSE, CAPILLARY     Status: Abnormal   Collection Time    11/01/12 12:15 PM      Result Value Range   Glucose-Capillary 211 (*) 70 - 99 mg/dL   Comment 1 Documented in Chart     Comment 2 Notify RN    OCCULT BLOOD X 1 CARD TO LAB, STOOL     Status: None   Collection Time    11/01/12  2:52 PM      Result Value Range   Fecal Occult Bld NEGATIVE  NEGATIVE  GLUCOSE, CAPILLARY     Status: Abnormal   Collection Time    11/01/12  5:20 PM      Result Value Range   Glucose-Capillary 190 (*) 70 - 99 mg/dL  GLUCOSE, CAPILLARY     Status: Abnormal   Collection Time    11/01/12  9:57 PM      Result Value Range   Glucose-Capillary 171 (*) 70 - 99 mg/dL   Comment 1 Notify RN    OCCULT BLOOD X 1 CARD TO LAB, STOOL     Status: None   Collection Time     11/01/12 10:52 PM  Result Value Range   Fecal Occult Bld NEGATIVE  NEGATIVE  BASIC METABOLIC PANEL     Status: Abnormal   Collection Time    11/02/12  4:51 AM      Result Value Range   Sodium 134 (*) 135 - 145 mEq/L   Potassium 3.6  3.5 - 5.1 mEq/L   Chloride 106  96 - 112 mEq/L   CO2 21  19 - 32 mEq/L   Glucose, Bld 166 (*) 70 - 99 mg/dL   BUN 36 (*) 6 - 23 mg/dL   Creatinine, Ser 2.09 (*) 0.50 - 1.35 mg/dL   Calcium 8.2 (*) 8.4 - 10.5 mg/dL   GFR calc non Af Amer 34 (*) >90 mL/min   GFR calc Af Amer 40 (*) >90 mL/min   Comment: (NOTE)     The eGFR has been calculated using the CKD EPI equation.     This calculation has not been validated in all clinical situations.     eGFR's persistently <90 mL/min signify possible Chronic Kidney     Disease.  CBC     Status: Abnormal   Collection Time    11/02/12  4:51 AM      Result Value Range   WBC 4.3  4.0 - 10.5 K/uL   RBC 3.44 (*) 4.22 - 5.81 MIL/uL   Hemoglobin 8.8 (*) 13.0 - 17.0 g/dL   HCT 28.1 (*) 39.0 - 52.0 %   MCV 81.7  78.0 - 100.0 fL   MCH 25.6 (*) 26.0 - 34.0 pg   MCHC 31.3  30.0 - 36.0 g/dL   RDW 17.1 (*) 11.5 - 15.5 %   Platelets 164  150 - 400 K/uL  PROTIME-INR     Status: Abnormal   Collection Time    11/02/12  4:51 AM      Result Value Range   Prothrombin Time 20.6 (*) 11.6 - 15.2 seconds   INR 1.83 (*) 0.00 - 1.49  GLUCOSE, CAPILLARY     Status: Abnormal   Collection Time    11/02/12  7:20 AM      Result Value Range   Glucose-Capillary 140 (*) 70 - 99 mg/dL  GLUCOSE, CAPILLARY     Status: Abnormal   Collection Time    11/02/12 12:07 PM      Result Value Range   Glucose-Capillary 200 (*) 70 - 99 mg/dL   Comment 1 Documented in Chart     Comment 2 Notify RN    GLUCOSE, CAPILLARY     Status: Abnormal   Collection Time    11/02/12  5:28 PM      Result Value Range   Glucose-Capillary 199 (*) 70 - 99 mg/dL   Comment 1 Documented in Chart     Comment 2 Notify RN    GLUCOSE, CAPILLARY     Status:  Abnormal   Collection Time    11/02/12 10:24 PM      Result Value Range   Glucose-Capillary 252 (*) 70 - 99 mg/dL  GLUCOSE, CAPILLARY     Status: Abnormal   Collection Time    11/03/12  7:33 AM      Result Value Range   Glucose-Capillary 176 (*) 70 - 99 mg/dL  BASIC METABOLIC PANEL     Status: Abnormal   Collection Time    11/03/12  7:48 AM      Result Value Range   Sodium 136  135 - 145 mEq/L   Potassium 3.7  3.5 -  5.1 mEq/L   Chloride 105  96 - 112 mEq/L   CO2 24  19 - 32 mEq/L   Glucose, Bld 175 (*) 70 - 99 mg/dL   BUN 29 (*) 6 - 23 mg/dL   Creatinine, Ser 1.81 (*) 0.50 - 1.35 mg/dL   Calcium 8.2 (*) 8.4 - 10.5 mg/dL   GFR calc non Af Amer 41 (*) >90 mL/min   GFR calc Af Amer 47 (*) >90 mL/min   Comment: (NOTE)     The eGFR has been calculated using the CKD EPI equation.     This calculation has not been validated in all clinical situations.     eGFR's persistently <90 mL/min signify possible Chronic Kidney     Disease.  PROTIME-INR     Status: Abnormal   Collection Time    11/03/12  7:48 AM      Result Value Range   Prothrombin Time 21.0 (*) 11.6 - 15.2 seconds   INR 1.87 (*) 0.00 - 1.49  GLUCOSE, CAPILLARY     Status: Abnormal   Collection Time    11/03/12 12:23 PM      Result Value Range   Glucose-Capillary 184 (*) 70 - 99 mg/dL   Comment 1 Documented in Chart     Comment 2 Notify RN    GLUCOSE, CAPILLARY     Status: Abnormal   Collection Time    11/03/12  5:09 PM      Result Value Range   Glucose-Capillary 191 (*) 70 - 99 mg/dL  GLUCOSE, CAPILLARY     Status: Abnormal   Collection Time    11/03/12 10:23 PM      Result Value Range   Glucose-Capillary 236 (*) 70 - 99 mg/dL   Comment 1 Notify RN    PROTIME-INR     Status: Abnormal   Collection Time    11/04/12  4:44 AM      Result Value Range   Prothrombin Time 22.7 (*) 11.6 - 15.2 seconds   INR 2.08 (*) 0.00 - 1.49  GLUCOSE, CAPILLARY     Status: Abnormal   Collection Time    11/04/12  7:29 AM       Result Value Range   Glucose-Capillary 185 (*) 70 - 99 mg/dL  GLUCOSE, CAPILLARY     Status: Abnormal   Collection Time    11/04/12 12:07 PM      Result Value Range   Glucose-Capillary 243 (*) 70 - 99 mg/dL  CBC WITH DIFFERENTIAL     Status: Abnormal   Collection Time    11/11/12  2:38 PM      Result Value Range   WBC 7.8  4.0 - 10.5 K/uL   RBC 3.95 (*) 4.22 - 5.81 MIL/uL   Hemoglobin 10.0 (*) 13.0 - 17.0 g/dL   HCT 31.0 (*) 39.0 - 52.0 %   MCV 78.5  78.0 - 100.0 fL   MCH 25.3 (*) 26.0 - 34.0 pg   MCHC 32.3  30.0 - 36.0 g/dL   RDW 17.2 (*) 11.5 - 15.5 %   Platelets 304  150 - 400 K/uL   Neutrophils Relative % 78 (*) 43 - 77 %   Neutro Abs 6.2  1.7 - 7.7 K/uL   Lymphocytes Relative 12  12 - 46 %   Lymphs Abs 0.9  0.7 - 4.0 K/uL   Monocytes Relative 7  3 - 12 %   Monocytes Absolute 0.5  0.1 - 1.0 K/uL   Eosinophils  Relative 2  0 - 5 %   Eosinophils Absolute 0.2  0.0 - 0.7 K/uL   Basophils Relative 1  0 - 1 %   Basophils Absolute 0.0  0.0 - 0.1 K/uL   Smear Review Criteria for review not met    BASIC METABOLIC PANEL     Status: Abnormal   Collection Time    11/11/12  2:38 PM      Result Value Range   Sodium 138  135 - 145 mEq/L   Potassium 4.4  3.5 - 5.3 mEq/L   Chloride 105  96 - 112 mEq/L   CO2 22  19 - 32 mEq/L   Glucose, Bld 131 (*) 70 - 99 mg/dL   BUN 44 (*) 6 - 23 mg/dL   Creat 2.57 (*) 0.50 - 1.35 mg/dL   Calcium 8.9  8.4 - 10.5 mg/dL  HEPATIC FUNCTION PANEL     Status: Abnormal   Collection Time    11/11/12  2:38 PM      Result Value Range   Total Bilirubin 0.6  0.3 - 1.2 mg/dL   Bilirubin, Direct 0.2  0.0 - 0.3 mg/dL   Indirect Bilirubin 0.4  0.0 - 0.9 mg/dL   Alkaline Phosphatase 124 (*) 39 - 117 U/L   AST 10  0 - 37 U/L   ALT 12  0 - 53 U/L   Total Protein 6.5  6.0 - 8.3 g/dL   Albumin 3.1 (*) 3.5 - 5.2 g/dL  URINALYSIS, ROUTINE W REFLEX MICROSCOPIC     Status: Abnormal   Collection Time    11/11/12  2:38 PM      Result Value Range   Color, Urine RED  (*) YELLOW   Comment: Biochemicals may be affected by the color of the urine.   APPearance TURBID (*) CLEAR   Specific Gravity, Urine 1.012  1.005 - 1.030   pH 5.5  5.0 - 8.0   Glucose, UA NEG  NEG mg/dL   Bilirubin Urine SMALL (*) NEG   Ketones, ur TRACE (*) NEG mg/dL   Hgb urine dipstick LARGE (*) NEG   Protein, ur 100 (*) NEG mg/dL   Urobilinogen, UA 1  0.0 - 1.0 mg/dL   Nitrite POS (*) NEG   Leukocytes, UA LARGE (*) NEG  LIPID PANEL     Status: Abnormal   Collection Time    11/11/12  2:38 PM      Result Value Range   Cholesterol 125  0 - 200 mg/dL   Comment: ATP III Classification:           < 200        mg/dL        Desirable          200 - 239     mg/dL        Borderline High          >= 240        mg/dL        High         Triglycerides 212 (*) <150 mg/dL   HDL 20 (*) >39 mg/dL   Total CHOL/HDL Ratio 6.3     VLDL 42 (*) 0 - 40 mg/dL   LDL Cholesterol 63  0 - 99 mg/dL   Comment:       Total Cholesterol/HDL Ratio:CHD Risk  Coronary Heart Disease Risk Table                                            Men       Women              1/2 Average Risk              3.4        3.3                  Average Risk              5.0        4.4               2X Average Risk              9.6        7.1               3X Average Risk             23.4       11.0     Use the calculated Patient Ratio above and the CHD Risk table      to determine the patient's CHD Risk.     ATP III Classification (LDL):           < 100        mg/dL         Optimal          100 - 129     mg/dL         Near or Above Optimal          130 - 159     mg/dL         Borderline High          160 - 189     mg/dL         High           > 190        mg/dL         Very High        PROTIME-INR     Status: Abnormal   Collection Time    11/11/12  2:38 PM      Result Value Range   Prothrombin Time 25.7 (*) 11.6 - 15.2 seconds   INR 2.46 (*) <1.50   Comment: The INR is of principal utility in  following patients on stable doses     of oral anticoagulants.  The therapeutic range is generally 2.0 to     3.0, but may be 3.0 to 4.0 in patients with mechanical cardiac valves,     recurrent embolisms and antiphospholipid antibodies (including lupus     inhibitors).  HEMOGLOBIN A1C     Status: Abnormal   Collection Time    11/11/12  2:38 PM      Result Value Range   Hemoglobin A1C 6.8 (*) <5.7 %   Comment:  According to the ADA Clinical Practice Recommendations for 2011, when     HbA1c is used as a screening test:             >=6.5%   Diagnostic of Diabetes Mellitus                (if abnormal result is confirmed)           5.7-6.4%   Increased risk of developing Diabetes Mellitus           References:Diagnosis and Classification of Diabetes Mellitus,Diabetes     [id number] 1):S62-S69 and Standards of Medical Care in             Diabetes - 2011,Diabetes [id number] (Suppl 1):S11-S61.         Mean Plasma Glucose 148 (*) <117 mg/dL  URINALYSIS, MICROSCOPIC ONLY     Status: Abnormal   Collection Time    11/11/12  2:38 PM      Result Value Range   Squamous Epithelial / LPF RARE  RARE   Crystals NONE SEEN  NONE SEEN   Casts NONE SEEN  NONE SEEN   WBC, UA >50 (*) <3 WBC/hpf   RBC / HPF >50 (*) <3 RBC/hpf   Bacteria, UA NONE SEEN  RARE  TSH     Status: Abnormal   Collection Time    11/11/12  2:55 PM      Result Value Range   TSH 4.744 (*) 0.350 - 4.500 uIU/mL  CULTURE, URINE COMPREHENSIVE     Status: None   Collection Time    11/12/12  2:57 PM      Result Value Range   Colony Count NO GROWTH     Organism ID, Bacteria NO GROWTH      Assessment/Plan: Hypothyroidism Repeat TSH.  On anticoagulant therapy Repeat PT/INR.  Preventive measure CBC, CMP

## 2012-12-10 LAB — COMPREHENSIVE METABOLIC PANEL
Albumin: 3.3 g/dL — ABNORMAL LOW (ref 3.5–5.2)
Alkaline Phosphatase: 80 U/L (ref 39–117)
CO2: 20 mEq/L (ref 19–32)
Glucose, Bld: 222 mg/dL — ABNORMAL HIGH (ref 70–99)
Potassium: 4.3 mEq/L (ref 3.5–5.3)
Sodium: 137 mEq/L (ref 135–145)
Total Protein: 6.3 g/dL (ref 6.0–8.3)

## 2012-12-10 LAB — TSH: TSH: 0.368 u[IU]/mL (ref 0.350–4.500)

## 2012-12-11 DIAGNOSIS — Z466 Encounter for fitting and adjustment of urinary device: Secondary | ICD-10-CM | POA: Diagnosis not present

## 2012-12-11 DIAGNOSIS — G8929 Other chronic pain: Secondary | ICD-10-CM | POA: Diagnosis not present

## 2012-12-11 DIAGNOSIS — M1990 Unspecified osteoarthritis, unspecified site: Secondary | ICD-10-CM | POA: Diagnosis not present

## 2012-12-11 DIAGNOSIS — E119 Type 2 diabetes mellitus without complications: Secondary | ICD-10-CM | POA: Diagnosis not present

## 2012-12-12 ENCOUNTER — Telehealth: Payer: Self-pay | Admitting: Physician Assistant

## 2012-12-12 DIAGNOSIS — N189 Chronic kidney disease, unspecified: Secondary | ICD-10-CM

## 2012-12-12 NOTE — Telephone Encounter (Signed)
Patient called for Nephrology referral and requested to speak to me.  Discussed patient's need to see Nephrology given history of Stage III CKD and recent worsening labs.  "Patient states he does not need a Nephrologist, his kidneys are fine."  Patient's renal function seems to be worsening since Urology removed his catheter.  Spoke with urologist, concerning getting patient an earlier appointment, but Urologist states it was not possible and for [place] to replace patient's catheter.  Patient refuses to have catheter replaced despite medical advise.  Patient has significant history of noncompliance with previous providers and has shown similar attitude toward his care with [place].

## 2012-12-12 NOTE — Telephone Encounter (Signed)
Will make urgent referral back to Urologist given patient's worsening kidney function s/p catheter removal by Urology.  Patient will also need to be managed by nephrology for advanced CKD.  Have made referral to nephrology in the past but patient apparently did not go.

## 2012-12-12 NOTE — Telephone Encounter (Signed)
D/T patient's results on kidney functions, attempt to get patient in with Urologist this week, Dr. Amalia Hailey w/[facility]; [name] spoke with provider & he cannot see him before his schedule appt on Fri, 10.24.14. Patient was informed and did not feel like he needed to see Urologist before that date, stating "that nothing was wrong with his urination"; pt informed that his kidney function is abnormal & close to the levels prior to his hospital admission in September. Patient also informed that an appt has been made for him with [facility], Dr. Dimas Aguas located at [address] [phone] for Mon, 10.20.14 at 3:00pm, as he is in need of a kidney specialist to monitor and help keep his kidneys healthy d/t CKD that can effect him systemically, if not taken care of. Pt was told to call their office if he wants to cancel and/or change appt set up for him, but it was felt necessary by his PCP that he be seen by Nephrologist. Pt stated he understood & agreed/SLS

## 2012-12-13 ENCOUNTER — Encounter: Payer: Self-pay | Admitting: *Deleted

## 2012-12-13 DIAGNOSIS — M1990 Unspecified osteoarthritis, unspecified site: Secondary | ICD-10-CM | POA: Diagnosis not present

## 2012-12-13 DIAGNOSIS — D62 Acute posthemorrhagic anemia: Secondary | ICD-10-CM | POA: Diagnosis not present

## 2012-12-13 DIAGNOSIS — N183 Chronic kidney disease, stage 3 unspecified: Secondary | ICD-10-CM | POA: Diagnosis not present

## 2012-12-13 DIAGNOSIS — Z466 Encounter for fitting and adjustment of urinary device: Secondary | ICD-10-CM | POA: Diagnosis not present

## 2012-12-13 DIAGNOSIS — N12 Tubulo-interstitial nephritis, not specified as acute or chronic: Secondary | ICD-10-CM | POA: Diagnosis not present

## 2012-12-13 DIAGNOSIS — E119 Type 2 diabetes mellitus without complications: Secondary | ICD-10-CM | POA: Diagnosis not present

## 2012-12-13 DIAGNOSIS — G8929 Other chronic pain: Secondary | ICD-10-CM | POA: Diagnosis not present

## 2012-12-16 DIAGNOSIS — D539 Nutritional anemia, unspecified: Secondary | ICD-10-CM | POA: Diagnosis not present

## 2012-12-16 DIAGNOSIS — D631 Anemia in chronic kidney disease: Secondary | ICD-10-CM | POA: Diagnosis not present

## 2012-12-16 DIAGNOSIS — D649 Anemia, unspecified: Secondary | ICD-10-CM | POA: Diagnosis not present

## 2012-12-16 DIAGNOSIS — I10 Essential (primary) hypertension: Secondary | ICD-10-CM | POA: Diagnosis not present

## 2012-12-17 ENCOUNTER — Telehealth: Payer: Self-pay | Admitting: *Deleted

## 2012-12-17 DIAGNOSIS — G8929 Other chronic pain: Secondary | ICD-10-CM | POA: Diagnosis not present

## 2012-12-17 DIAGNOSIS — E119 Type 2 diabetes mellitus without complications: Secondary | ICD-10-CM | POA: Diagnosis not present

## 2012-12-17 DIAGNOSIS — M1990 Unspecified osteoarthritis, unspecified site: Secondary | ICD-10-CM | POA: Diagnosis not present

## 2012-12-17 DIAGNOSIS — Z466 Encounter for fitting and adjustment of urinary device: Secondary | ICD-10-CM | POA: Diagnosis not present

## 2012-12-17 NOTE — Telephone Encounter (Signed)
Notified [name] at [facility] and she will call pt.

## 2012-12-17 NOTE — Telephone Encounter (Signed)
He should increase coumadin to 3mg  once daily (can take 1.5 tabs of the 2 mg coumadin).   Needs follow up PT/INR in 1 week.

## 2012-12-17 NOTE — Telephone Encounter (Signed)
Received message from [place] with [facility] wanting to know if we received pt's recent labs. Labs from 11/1712 received on 12/16/12. She is wanting to know if there are further instructions concerning pt's PT / INR? Results forwarded to nurse practitioner since pt's Provider is out of the office today.  Please advise.

## 2012-12-17 NOTE — Addendum Note (Signed)
Addended by: Kelle Darting A on: 12/17/2012 03:02 PM   Modules accepted: Orders

## 2012-12-18 ENCOUNTER — Telehealth: Payer: Self-pay | Admitting: *Deleted

## 2012-12-18 NOTE — Telephone Encounter (Signed)
Lisa w/[facility] reports that they would like to discuss patient being D/C from their services d/t non-compliance; cannot get pt to return calls RE: Coumadin check, states pt needs to be either Recerted or Discharges next week and would like to discuss/SLS

## 2012-12-18 NOTE — Telephone Encounter (Signed)
Spoke with [name] concerning Mr. Dommer and his noncompliance.  She agrees to recheck his PT/INR on Monday, but after that I have no reason for Home Health to continue with Mr. Verity, especially given his argumentative behavior and noncompliance.

## 2012-12-19 DIAGNOSIS — Z466 Encounter for fitting and adjustment of urinary device: Secondary | ICD-10-CM | POA: Diagnosis not present

## 2012-12-19 DIAGNOSIS — M1990 Unspecified osteoarthritis, unspecified site: Secondary | ICD-10-CM | POA: Diagnosis not present

## 2012-12-19 DIAGNOSIS — E119 Type 2 diabetes mellitus without complications: Secondary | ICD-10-CM | POA: Diagnosis not present

## 2012-12-19 DIAGNOSIS — G8929 Other chronic pain: Secondary | ICD-10-CM | POA: Diagnosis not present

## 2012-12-20 DIAGNOSIS — K8020 Calculus of gallbladder without cholecystitis without obstruction: Secondary | ICD-10-CM | POA: Diagnosis not present

## 2012-12-20 DIAGNOSIS — N200 Calculus of kidney: Secondary | ICD-10-CM | POA: Diagnosis not present

## 2012-12-20 DIAGNOSIS — R350 Frequency of micturition: Secondary | ICD-10-CM | POA: Diagnosis not present

## 2012-12-20 DIAGNOSIS — N2889 Other specified disorders of kidney and ureter: Secondary | ICD-10-CM | POA: Diagnosis not present

## 2012-12-20 DIAGNOSIS — N289 Disorder of kidney and ureter, unspecified: Secondary | ICD-10-CM | POA: Diagnosis not present

## 2012-12-23 ENCOUNTER — Telehealth: Payer: Self-pay | Admitting: Physician Assistant

## 2012-12-23 DIAGNOSIS — G8929 Other chronic pain: Secondary | ICD-10-CM | POA: Diagnosis not present

## 2012-12-23 DIAGNOSIS — Z466 Encounter for fitting and adjustment of urinary device: Secondary | ICD-10-CM | POA: Diagnosis not present

## 2012-12-23 DIAGNOSIS — E119 Type 2 diabetes mellitus without complications: Secondary | ICD-10-CM | POA: Diagnosis not present

## 2012-12-23 DIAGNOSIS — M1990 Unspecified osteoarthritis, unspecified site: Secondary | ICD-10-CM | POA: Diagnosis not present

## 2012-12-23 NOTE — Telephone Encounter (Signed)
Spoke to [place] w/ [facility] concerning patient's PT/INR at today's site visit.  She endorses patients PT at 53.1 and INR at 4.4.  Instructed her to inform the patient to hold his coumadin for 3 days then restart coumadin at 2 mg daily.  I want to see the patient in 1 week.  Patient disagreed with plan and wished to speak with me personally.  Discussed reasoning for repeat INR check and office visit to discuss alternatives to coumadin.  Patient states he cannot come because he already has doctor's appointment next week with Nephrology.  Advised patient to come in this week.  Patient declines.  Patient has also declined use of coumadin clinics near his house. Patient does have difficulty with transportation, but patient has history of argumentation and noncompliance.  Patient instructed to discuss plan with his wife and schedule an appointment.  If patient cannot follow our instructions to ensure his safety and continued health, then he will be dismissed from this practice.

## 2012-12-24 ENCOUNTER — Other Ambulatory Visit: Payer: Self-pay | Admitting: Physician Assistant

## 2012-12-24 DIAGNOSIS — Z466 Encounter for fitting and adjustment of urinary device: Secondary | ICD-10-CM | POA: Diagnosis not present

## 2012-12-24 DIAGNOSIS — M1990 Unspecified osteoarthritis, unspecified site: Secondary | ICD-10-CM | POA: Diagnosis not present

## 2012-12-24 DIAGNOSIS — Z7901 Long term (current) use of anticoagulants: Secondary | ICD-10-CM

## 2012-12-24 DIAGNOSIS — E119 Type 2 diabetes mellitus without complications: Secondary | ICD-10-CM | POA: Diagnosis not present

## 2012-12-24 DIAGNOSIS — G8929 Other chronic pain: Secondary | ICD-10-CM | POA: Diagnosis not present

## 2012-12-24 MED ORDER — WARFARIN SODIUM 2 MG PO TABS: 2.0000 mg | ORAL_TABLET | Freq: Every day | ORAL | Status: DC

## 2012-12-26 DIAGNOSIS — D649 Anemia, unspecified: Secondary | ICD-10-CM | POA: Diagnosis not present

## 2012-12-26 DIAGNOSIS — I10 Essential (primary) hypertension: Secondary | ICD-10-CM | POA: Diagnosis not present

## 2012-12-30 ENCOUNTER — Telehealth: Payer: Self-pay | Admitting: Physician Assistant

## 2012-12-30 NOTE — Telephone Encounter (Signed)
Called patient to remind him to come in to clinic for PT/INR check and to discuss changes to anticoagulation therapy.  Patient was supposed to come in last week given INR of 4.4 at last check by Home health agency.  [facility] is no longer taking part in Mr. Madani care and therefore his PT/INR must be checked in our clinic, due to patient refusing Coumadin Clinic.  LMOM with our return number.  If patient is not seen in clinic this week, giving his multiple medical problems and history of severe noncompliance and argumentativeness, he will have to be dismissed from our practice.

## 2012-12-30 NOTE — Telephone Encounter (Signed)
Patient scheduled appointment for Friday, 01/03/13 and patient states that he checked his INR this morning and it was 2.6.

## 2013-01-01 ENCOUNTER — Encounter: Payer: Self-pay | Admitting: Family Medicine

## 2013-01-03 ENCOUNTER — Encounter: Payer: Self-pay | Admitting: Physician Assistant

## 2013-01-03 ENCOUNTER — Ambulatory Visit (INDEPENDENT_AMBULATORY_CARE_PROVIDER_SITE_OTHER): Payer: Medicare Other | Admitting: Physician Assistant

## 2013-01-03 VITALS — BP 112/68 | HR 96 | Temp 97.8°F | Resp 18 | Ht 71.0 in

## 2013-01-03 DIAGNOSIS — Z7901 Long term (current) use of anticoagulants: Secondary | ICD-10-CM | POA: Diagnosis not present

## 2013-01-03 NOTE — Progress Notes (Signed)
Patient ID: [name], male   DOB: November 09, 1955, 57 y.o.   MRN: [id number]  Patient presents to clinic today for PT/INR recheck and follow-up.  Last INR reported by Home Health was 4.4.  Patient is now back to taking a 2 mg tablet coumadin daily.  Will recheck PT/INR.  Patient recently seen by nephrology who stated his kidney function is declining (Stage IV CKD) and his prognosis is not good.  Also started patient on EPO.  Patient states he feels fine and does not care what the doctor said.  Had appointment with Urology yesterday for discussion of nephrolithiasis removal as well as possible future placement of suprapubic catheter.  Patient missed his appointment and cannot be seen until December.  Patient has no other complaints.   Past Medical History  Diagnosis Date  . Sleep apnea     uses cpap  . Hypothyroidism   . Anemia   . Blood transfusion   . Chronic kidney disease     hx of kidney stones  . Hypertension   . Arthritis     osteoarthritis  . Pneumonia   . Diabetes mellitus     insulin dependent  . DVT (deep vein thrombosis) in pregnancy   . Peripheral vascular disease   . Hyperlipidemia   . Morbid obesity   . Hypertrophy of prostate   . Acute respiratory failure   . Cellulitis and abscess of leg   . Chronic kidney disease, stage III (moderate)   . Nephrolithiasis   . Anxiety   . Chronic pain     Current Outpatient Prescriptions on File Prior to Visit  Medication Sig Dispense Refill  . atorvastatin (LIPITOR) 40 MG tablet Take 40 mg by mouth every evening. Patient takes in the evening      . cyclobenzaprine (FLEXERIL) 5 MG tablet Take 5 mg by mouth at bedtime.      . docusate sodium 100 MG CAPS Take 100 mg by mouth 2 (two) times daily.      . ferrous sulfate 325 (65 FE) MG tablet Take 325 mg by mouth 2 (two) times daily.       Marland Kitchen HYDROcodone-acetaminophen (NORCO/VICODIN) 5-325 MG per tablet Take 1 tablet by mouth every 6 (six) hours as needed for pain.  60 tablet  0  .  insulin aspart (NOVOLOG) 100 UNIT/ML injection Inject 3 Units into the skin 3 (three) times daily with meals. And q HS for BS> 150.  1 vial    . insulin glargine (LANTUS) 100 UNIT/ML injection Inject 0.15 mLs (15 Units total) into the skin at bedtime. PATIENT REPORTS THAT HE TAKES 40 UNITS DAILY.  10 mL  1  . Insulin Syringe-Needle U-100 (INSULIN SYRINGE .5CC/31GX5/16") 31G X 5/16" 0.5 ML MISC USE AS DIRECTED TO INJECT INSULIN FOUR TIMES DAILY Dx: 250.00  120 each  5  . levothyroxine (SYNTHROID, LEVOTHROID) 200 MCG tablet Take 1 tablet (200 mcg total) by mouth every morning.  30 tablet  2  . levothyroxine (SYNTHROID, LEVOTHROID) 25 MCG tablet Take 1 tablet (25 mcg total) by mouth daily before breakfast.  30 tablet  1  . lidocaine (XYLOCAINE) 2 % jelly Apply topically every 8 (eight) hours as needed (urethral pain).  30 mL  0  . Multiple Vitamins-Minerals (MULTIVITAMINS THER. W/MINERALS) TABS Take 1 tablet by mouth daily.       . phenazopyridine (PYRIDIUM) 100 MG tablet Take 100 mg by mouth 3 (three) times daily as needed for pain (for candidiasis).      Marland Kitchen  tamsulosin (FLOMAX) 0.4 MG CAPS capsule Take 0.4 mg by mouth at bedtime.       Marland Kitchen warfarin (COUMADIN) 2 MG tablet Take 1 tablet (2 mg total) by mouth daily at 6 PM.  30 tablet  2   No current facility-administered medications on file prior to visit.    No Known Allergies  Family History  Problem Relation Age of Onset  . Diabetes Mother   . Heart attack Mother   . Stroke Mother   . Colon cancer Father   . Dementia Father   . Heart attack Father   . Arthritis Paternal Grandmother     History   Social History  . Marital Status: Married    Spouse Name: N/A    Number of Children: N/A  . Years of Education: N/A   Social History Main Topics  . Smoking status: Never Smoker   . Smokeless tobacco: Never Used  . Alcohol Use: No  . Drug Use: No  . Sexual Activity: Yes   Other Topics Concern  . None   Social History Narrative   Lives  with his wife and uses a wheelchair for transfers.     ROS See HPI.  All other ROS are negative.  Filed Vitals:   01/03/13 1502  BP: 112/68  Pulse: 96  Temp: 97.8 F (36.6 C)  Resp: 18   Physical Exam  Vitals reviewed. Constitutional:  Obese, well-developed, in NAD.  HENT:  Head: Normocephalic and atraumatic.  Eyes: Conjunctivae are normal.  Neck: Neck supple.  Cardiovascular: Normal rate, regular rhythm and normal heart sounds.   Pulmonary/Chest: Effort normal and breath sounds normal. No respiratory distress. He has no wheezes. He has no rales. He exhibits no tenderness.  Skin: Skin is warm and dry. No rash noted.     Recent Results (from the past 2160 hour(s))  COMPREHENSIVE METABOLIC PANEL     Status: Abnormal   Collection Time    10/10/12  1:06 PM      Result Value Range   Sodium 133 (*) 135 - 145 mEq/L   Potassium 3.3 (*) 3.5 - 5.1 mEq/L   Chloride 102  96 - 112 mEq/L   CO2 21  19 - 32 mEq/L   Glucose, Bld 155 (*) 70 - 99 mg/dL   BUN 21  6 - 23 mg/dL   Creatinine, Ser 1.75 (*) 0.50 - 1.35 mg/dL   Calcium 9.1  8.4 - 10.5 mg/dL   Total Protein 6.6  6.0 - 8.3 g/dL   Albumin 2.7 (*) 3.5 - 5.2 g/dL   AST 9  0 - 37 U/L   ALT 8  0 - 53 U/L   Alkaline Phosphatase 103  39 - 117 U/L   Total Bilirubin 0.6  0.3 - 1.2 mg/dL   GFR calc non Af Amer 42 (*) >90 mL/min   GFR calc Af Amer 49 (*) >90 mL/min   Comment: (NOTE)     The eGFR has been calculated using the CKD EPI equation.     This calculation has not been validated in all clinical situations.     eGFR's persistently <90 mL/min signify possible Chronic Kidney     Disease.  CBC WITH DIFFERENTIAL     Status: Abnormal   Collection Time    10/10/12  1:06 PM      Result Value Range   WBC 5.4  4.0 - 10.5 K/uL   RBC 3.83 (*) 4.22 - 5.81 MIL/uL   Hemoglobin 10.2 (*)  13.0 - 17.0 g/dL   HCT 32.5 (*) 39.0 - 52.0 %   MCV 84.9  78.0 - 100.0 fL   MCH 26.6  26.0 - 34.0 pg   MCHC 31.4  30.0 - 36.0 g/dL   RDW 15.5  11.5 -  15.5 %   Platelets 209  150 - 400 K/uL   Neutrophils Relative % 73  43 - 77 %   Neutro Abs 3.9  1.7 - 7.7 K/uL   Lymphocytes Relative 15  12 - 46 %   Lymphs Abs 0.8  0.7 - 4.0 K/uL   Monocytes Relative 9  3 - 12 %   Monocytes Absolute 0.5  0.1 - 1.0 K/uL   Eosinophils Relative 3  0 - 5 %   Eosinophils Absolute 0.2  0.0 - 0.7 K/uL   Basophils Relative 0  0 - 1 %   Basophils Absolute 0.0  0.0 - 0.1 K/uL  PROTIME-INR     Status: Abnormal   Collection Time    10/10/12  1:06 PM      Result Value Range   Prothrombin Time 21.6 (*) 11.6 - 15.2 seconds   INR 1.94 (*) 0.00 - 1.49  LIPASE, BLOOD     Status: None   Collection Time    10/10/12  1:06 PM      Result Value Range   Lipase 11  11 - 59 U/L  URINALYSIS, ROUTINE W REFLEX MICROSCOPIC     Status: Abnormal   Collection Time    10/10/12  2:38 PM      Result Value Range   Color, Urine ORANGE (*) YELLOW   Comment: BIOCHEMICALS MAY BE AFFECTED BY COLOR   APPearance TURBID (*) CLEAR   Specific Gravity, Urine 1.020  1.005 - 1.030   pH 6.0  5.0 - 8.0   Glucose, UA NEGATIVE  NEGATIVE mg/dL   Hgb urine dipstick LARGE (*) NEGATIVE   Bilirubin Urine SMALL (*) NEGATIVE   Ketones, ur NEGATIVE  NEGATIVE mg/dL   Protein, ur 100 (*) NEGATIVE mg/dL   Urobilinogen, UA 1.0  0.0 - 1.0 mg/dL   Nitrite POSITIVE (*) NEGATIVE   Leukocytes, UA LARGE (*) NEGATIVE  URINE MICROSCOPIC-ADD ON     Status: Abnormal   Collection Time    10/10/12  2:38 PM      Result Value Range   Squamous Epithelial / LPF FEW (*) RARE   WBC, UA TOO NUMEROUS TO COUNT  <3 WBC/hpf   RBC / HPF 11-20  <3 RBC/hpf   Bacteria, UA FEW (*) RARE  URINE CULTURE     Status: None   Collection Time    10/10/12  2:38 PM      Result Value Range   Specimen Description URINE, CLEAN CATCH     Special Requests NONE     Culture  Setup Time       Value: 10/10/2012 23:30     Performed at [facility]       Value: NO GROWTH     Performed at [facility]    Culture       Value: NO GROWTH     Performed at [facility]   Report Status 10/11/2012 FINAL    PROTIME-INR     Status: Abnormal   Collection Time    10/24/12  9:15 AM      Result Value Range   Prothrombin Time 24.1 (*) 11.6 - 15.2 seconds   INR  2.24 (*) 0.00 - 1.49  APTT     Status: Abnormal   Collection Time    10/24/12  9:15 AM      Result Value Range   aPTT 50 (*) 24 - 37 seconds   Comment:            IF BASELINE aPTT IS ELEVATED,     SUGGEST PATIENT RISK ASSESSMENT     BE USED TO DETERMINE APPROPRIATE     ANTICOAGULANT THERAPY.  SURGICAL PCR SCREEN     Status: None   Collection Time    10/24/12  9:15 AM      Result Value Range   MRSA, PCR NEGATIVE  NEGATIVE   Staphylococcus aureus NEGATIVE  NEGATIVE   Comment:            The Xpert SA Assay (FDA     approved for NASAL specimens     in patients over 22 years of age),     is one component of     a comprehensive surveillance     program.  Test performance has     been validated by [employer] for patients greater     than or equal to 86 year old.     It is not intended     to diagnose infection nor to     guide or monitor treatment.  GLUCOSE, CAPILLARY     Status: Abnormal   Collection Time    10/24/12  9:22 AM      Result Value Range   Glucose-Capillary 102 (*) 70 - 99 mg/dL  URINE CULTURE     Status: None   Collection Time    10/24/12 11:40 AM      Result Value Range   Specimen Description URINE, CLEAN CATCH     Special Requests PATIENT ON FOLLOWING GENTAMICIN 650MG      Culture  Setup Time       Value: 10/24/2012 15:08     Performed at [place]       Value: NO GROWTH     Performed at [facility]   Culture       Value: NO GROWTH     Performed at [facility]   Report Status 10/25/2012 FINAL    GLUCOSE, CAPILLARY     Status: Abnormal   Collection Time    10/24/12 12:15 PM      Result Value Range   Glucose-Capillary 120 (*) 70 - 99 mg/dL   GLUCOSE, CAPILLARY     Status: Abnormal   Collection Time    10/24/12  5:23 PM      Result Value Range   Glucose-Capillary 110 (*) 70 - 99 mg/dL  BASIC METABOLIC PANEL     Status: Abnormal   Collection Time    10/24/12  6:05 PM      Result Value Range   Sodium 136  135 - 145 mEq/L   Potassium 3.7  3.5 - 5.1 mEq/L   Chloride 106  96 - 112 mEq/L   CO2 23  19 - 32 mEq/L   Glucose, Bld 142 (*) 70 - 99 mg/dL   BUN 24 (*) 6 - 23 mg/dL   Creatinine, Ser 1.90 (*) 0.50 - 1.35 mg/dL   Calcium 8.6  8.4 - 10.5 mg/dL   GFR calc non Af Amer 38 (*) >90 mL/min   GFR calc Af Amer 45 (*) >90 mL/min   Comment: (  NOTE)     The eGFR has been calculated using the CKD EPI equation.     This calculation has not been validated in all clinical situations.     eGFR's persistently <90 mL/min signify possible Chronic Kidney     Disease.  GLUCOSE, CAPILLARY     Status: Abnormal   Collection Time    10/24/12  9:40 PM      Result Value Range   Glucose-Capillary 203 (*) 70 - 99 mg/dL   Comment 1 Documented in Chart     Comment 2 Notify RN    GLUCOSE, CAPILLARY     Status: Abnormal   Collection Time    10/25/12  7:44 AM      Result Value Range   Glucose-Capillary 162 (*) 70 - 99 mg/dL  GLUCOSE, CAPILLARY     Status: Abnormal   Collection Time    10/25/12 11:10 AM      Result Value Range   Glucose-Capillary 213 (*) 70 - 99 mg/dL  CBC WITH DIFFERENTIAL     Status: Abnormal   Collection Time    10/29/12  4:42 PM      Result Value Range   WBC 5.6  4.0 - 10.5 K/uL   RBC 2.89 (*) 4.22 - 5.81 MIL/uL   Hemoglobin 7.6 (*) 13.0 - 17.0 g/dL   HCT 24.4 (*) 39.0 - 52.0 %   MCV 84.4  78.0 - 100.0 fL   MCH 26.3  26.0 - 34.0 pg   MCHC 31.1  30.0 - 36.0 g/dL   RDW 16.7 (*) 11.5 - 15.5 %   Platelets 187  150 - 400 K/uL   Neutrophils Relative % 77  43 - 77 %   Neutro Abs 4.3  1.7 - 7.7 K/uL   Lymphocytes Relative 10 (*) 12 - 46 %   Lymphs Abs 0.6 (*) 0.7 - 4.0 K/uL   Monocytes Relative 11  3 - 12 %    Monocytes Absolute 0.6  0.1 - 1.0 K/uL   Eosinophils Relative 2  0 - 5 %   Eosinophils Absolute 0.1  0.0 - 0.7 K/uL   Basophils Relative 0  0 - 1 %   Basophils Absolute 0.0  0.0 - 0.1 K/uL  COMPREHENSIVE METABOLIC PANEL     Status: Abnormal   Collection Time    10/29/12  4:42 PM      Result Value Range   Sodium 132 (*) 135 - 145 mEq/L   Potassium 4.5  3.5 - 5.1 mEq/L   Chloride 102  96 - 112 mEq/L   CO2 18 (*) 19 - 32 mEq/L   Glucose, Bld 185 (*) 70 - 99 mg/dL   BUN 71 (*) 6 - 23 mg/dL   Creatinine, Ser 5.09 (*) 0.50 - 1.35 mg/dL   Calcium 8.2 (*) 8.4 - 10.5 mg/dL   Total Protein 6.0  6.0 - 8.3 g/dL   Albumin 2.3 (*) 3.5 - 5.2 g/dL   AST 10  0 - 37 U/L   ALT 10  0 - 53 U/L   Alkaline Phosphatase 102  39 - 117 U/L   Total Bilirubin 0.4  0.3 - 1.2 mg/dL   GFR calc non Af Amer 12 (*) >90 mL/min   GFR calc Af Amer 14 (*) >90 mL/min   Comment: (NOTE)     The eGFR has been calculated using the CKD EPI equation.     This calculation has not been validated in all clinical situations.  eGFR's persistently <90 mL/min signify possible Chronic Kidney     Disease.  TYPE AND SCREEN     Status: None   Collection Time    10/29/12  4:45 PM      Result Value Range   ABO/RH(D) O POS     Antibody Screen NEG     Sample Expiration 11/01/2012     Unit Number [id number]     Blood Component Type RED CELLS,LR     Unit division 00     Status of Unit ISSUED,FINAL     Transfusion Status OK TO TRANSFUSE     Crossmatch Result Compatible     Unit Number [id number]     Blood Component Type RED CELLS,LR     Unit division 00     Status of Unit ISSUED,FINAL     Transfusion Status OK TO TRANSFUSE     Crossmatch Result Compatible    CG4 I-STAT (LACTIC ACID)     Status: None   Collection Time    10/29/12  5:30 PM      Result Value Range   Lactic Acid, Venous 1.66  0.5 - 2.2 mmol/L  URINALYSIS, ROUTINE W REFLEX MICROSCOPIC     Status: Abnormal   Collection Time    10/29/12  6:05 PM       Result Value Range   Color, Urine RED (*) YELLOW   Comment: BIOCHEMICALS MAY BE AFFECTED BY COLOR   APPearance TURBID (*) CLEAR   Specific Gravity, Urine 1.031 (*) 1.005 - 1.030   pH 5.0  5.0 - 8.0   Glucose, UA NEGATIVE  NEGATIVE mg/dL   Hgb urine dipstick LARGE (*) NEGATIVE   Bilirubin Urine LARGE (*) NEGATIVE   Ketones, ur 15 (*) NEGATIVE mg/dL   Protein, ur >300 (*) NEGATIVE mg/dL   Urobilinogen, UA 1.0  0.0 - 1.0 mg/dL   Nitrite POSITIVE (*) NEGATIVE   Leukocytes, UA LARGE (*) NEGATIVE  URINE MICROSCOPIC-ADD ON     Status: None   Collection Time    10/29/12  6:05 PM      Result Value Range   Urine-Other URINALYSIS PERFORMED ON SUPERNATANT     Comment: FIELD OBSCURED BY RBC'S  APTT     Status: Abnormal   Collection Time    10/29/12  6:18 PM      Result Value Range   aPTT 59 (*) 24 - 37 seconds   Comment:            IF BASELINE aPTT IS ELEVATED,     SUGGEST PATIENT RISK ASSESSMENT     BE USED TO DETERMINE APPROPRIATE     ANTICOAGULANT THERAPY.  PROTIME-INR     Status: Abnormal   Collection Time    10/29/12  6:18 PM      Result Value Range   Prothrombin Time 30.5 (*) 11.6 - 15.2 seconds   INR 3.06 (*) 0.00 - 1.49  PREPARE RBC (CROSSMATCH)     Status: None   Collection Time    10/29/12  6:30 PM      Result Value Range   Order Confirmation ORDER PROCESSED BY BLOOD BANK    MRSA PCR SCREENING     Status: None   Collection Time    10/29/12 10:25 PM      Result Value Range   MRSA by PCR NEGATIVE  NEGATIVE   Comment:            The GeneXpert MRSA Assay (FDA     approved  for NASAL specimens     only), is one component of a     comprehensive MRSA colonization     surveillance program. It is not     intended to diagnose MRSA     infection nor to guide or     monitor treatment for     MRSA infections.  CULTURE, BLOOD (ROUTINE X 2)     Status: None   Collection Time    10/29/12 11:05 PM      Result Value Range   Specimen Description BLOOD LEFT HAND     Special  Requests BOTTLES DRAWN AEROBIC ONLY 2CC     Culture  Setup Time       Value: 10/30/2012 05:21     Performed at [facility]   Culture       Value: NO GROWTH 5 DAYS     Performed at [facility]   Report Status 11/05/2012 FINAL    GLUCOSE, CAPILLARY     Status: Abnormal   Collection Time    10/29/12 11:10 PM      Result Value Range   Glucose-Capillary 120 (*) 70 - 99 mg/dL  CULTURE, BLOOD (ROUTINE X 2)     Status: None   Collection Time    10/29/12 11:11 PM      Result Value Range   Specimen Description BLOOD RIGHT ANTECUBITAL     Special Requests BOTTLES DRAWN AEROBIC AND ANAEROBIC 3CC     Culture  Setup Time       Value: 10/30/2012 05:21     Performed at [facility]   Culture       Value: NO GROWTH 5 DAYS     Performed at [facility]   Report Status 11/05/2012 FINAL    CORTISOL     Status: None   Collection Time    10/29/12 11:11 PM      Result Value Range   Cortisol, Plasma 8.6     Comment: (NOTE)     AM:  4.3 - 22.4 ug/dL     PM:  3.1 - 16.7 ug/dL     Performed at [facility]  TROPONIN I     Status: None   Collection Time    10/29/12 11:11 PM      Result Value Range   Troponin I <0.30  <0.30 ng/mL   Comment:            Due to the release kinetics of cTnI,     a negative result within the first hours     of the onset of symptoms does not rule out     myocardial infarction with certainty.     If myocardial infarction is still suspected,     repeat the test at appropriate intervals.  LIPID PANEL     Status: Abnormal   Collection Time    10/29/12 11:11 PM      Result Value Range   Cholesterol 100  0 - 200 mg/dL   Triglycerides 226 (*) <150 mg/dL   HDL 12 (*) >39 mg/dL   Total CHOL/HDL Ratio 8.3     VLDL 45 (*) 0 - 40 mg/dL   LDL Cholesterol 43  0 - 99 mg/dL   Comment:            Total Cholesterol/HDL:CHD Risk     Coronary Heart Disease Risk Table  Men   Women      1/2 Average Risk   3.4   3.3       Average Risk       5.0   4.4      2 X Average Risk   9.6   7.1      3 X Average Risk  23.4   11.0                Use the calculated Patient Ratio     above and the CHD Risk Table     to determine the patient's CHD Risk.                ATP III CLASSIFICATION (LDL):      <100     mg/dL   Optimal      100-129  mg/dL   Near or Above                        Optimal      130-159  mg/dL   Borderline      160-189  mg/dL   High      >190     mg/dL   Very High     Performed at [facility]  SODIUM, URINE, RANDOM     Status: None   Collection Time    10/30/12  1:37 AM      Result Value Range   Sodium, Ur 89     Comment: Performed at [place], URINE, RANDOM     Status: None   Collection Time    10/30/12  1:37 AM      Result Value Range   Creatinine, Urine 58.1     Comment: Performed at [place]     Status: Abnormal   Collection Time    10/30/12  5:20 AM      Result Value Range   Sodium 134 (*) 135 - 145 mEq/L   Potassium 4.1  3.5 - 5.1 mEq/L   Chloride 108  96 - 112 mEq/L   CO2 17 (*) 19 - 32 mEq/L   Glucose, Bld 135 (*) 70 - 99 mg/dL   BUN 65 (*) 6 - 23 mg/dL   Creatinine, Ser 4.25 (*) 0.50 - 1.35 mg/dL   Calcium 7.5 (*) 8.4 - 10.5 mg/dL   GFR calc non Af Amer 15 (*) >90 mL/min   GFR calc Af Amer 17 (*) >90 mL/min   Comment: (NOTE)     The eGFR has been calculated using the CKD EPI equation.     This calculation has not been validated in all clinical situations.     eGFR's persistently <90 mL/min signify possible Chronic Kidney     Disease.  CBC     Status: Abnormal   Collection Time    10/30/12  5:20 AM      Result Value Range   WBC 4.8  4.0 - 10.5 K/uL   RBC 3.32 (*) 4.22 - 5.81 MIL/uL   Hemoglobin 8.8 (*) 13.0 - 17.0 g/dL   HCT 27.7 (*) 39.0 - 52.0 %   MCV 83.4  78.0 - 100.0 fL   MCH 26.5  26.0 - 34.0 pg   MCHC 31.8  30.0 - 36.0 g/dL   RDW 17.7 (*) 11.5 - 15.5 %   Platelets 153  150 - 400 K/uL   TROPONIN I     Status: None  Collection Time    10/30/12  5:20 AM      Result Value Range   Troponin I <0.30  <0.30 ng/mL   Comment:            Due to the release kinetics of cTnI,     a negative result within the first hours     of the onset of symptoms does not rule out     myocardial infarction with certainty.     If myocardial infarction is still suspected,     repeat the test at appropriate intervals.  GLUCOSE, CAPILLARY     Status: Abnormal   Collection Time    10/30/12  8:08 AM      Result Value Range   Glucose-Capillary 118 (*) 70 - 99 mg/dL  TROPONIN I     Status: None   Collection Time    10/30/12 10:50 AM      Result Value Range   Troponin I <0.30  <0.30 ng/mL   Comment:            Due to the release kinetics of cTnI,     a negative result within the first hours     of the onset of symptoms does not rule out     myocardial infarction with certainty.     If myocardial infarction is still suspected,     repeat the test at appropriate intervals.  GLUCOSE, CAPILLARY     Status: Abnormal   Collection Time    10/30/12 12:11 PM      Result Value Range   Glucose-Capillary 121 (*) 70 - 99 mg/dL  URINE CULTURE     Status: None   Collection Time    10/30/12 12:30 PM      Result Value Range   Specimen Description URINE, CATHETERIZED     Special Requests NONE     Culture  Setup Time       Value: 10/30/2012 15:34     Performed at [facility]       Value: NO GROWTH     Performed at [facility]   Culture       Value: NO GROWTH     Performed at [facility]   Report Status 10/31/2012 FINAL    GLUCOSE, CAPILLARY     Status: None   Collection Time    10/30/12  5:00 PM      Result Value Range   Glucose-Capillary 92  70 - 99 mg/dL  GLUCOSE, CAPILLARY     Status: Abnormal   Collection Time    10/30/12  9:52 PM      Result Value Range   Glucose-Capillary 129 (*) 70 - 99 mg/dL  BASIC METABOLIC PANEL     Status: Abnormal    Collection Time    10/31/12  3:35 AM      Result Value Range   Sodium 135  135 - 145 mEq/L   Potassium 4.2  3.5 - 5.1 mEq/L   Chloride 109  96 - 112 mEq/L   CO2 18 (*) 19 - 32 mEq/L   Glucose, Bld 163 (*) 70 - 99 mg/dL   BUN 61 (*) 6 - 23 mg/dL   Creatinine, Ser 3.62 (*) 0.50 - 1.35 mg/dL   Calcium 7.8 (*) 8.4 - 10.5 mg/dL   GFR calc non Af Amer 18 (*) >90 mL/min   GFR calc Af Amer 20 (*) >90 mL/min   Comment: (NOTE)  The eGFR has been calculated using the CKD EPI equation.     This calculation has not been validated in all clinical situations.     eGFR's persistently <90 mL/min signify possible Chronic Kidney     Disease.  CBC     Status: Abnormal   Collection Time    10/31/12  3:35 AM      Result Value Range   WBC 4.3  4.0 - 10.5 K/uL   RBC 3.41 (*) 4.22 - 5.81 MIL/uL   Hemoglobin 8.8 (*) 13.0 - 17.0 g/dL   HCT 28.5 (*) 39.0 - 52.0 %   MCV 83.6  78.0 - 100.0 fL   MCH 25.8 (*) 26.0 - 34.0 pg   MCHC 30.9  30.0 - 36.0 g/dL   RDW 18.0 (*) 11.5 - 15.5 %   Platelets 165  150 - 400 K/uL  PROTIME-INR     Status: Abnormal   Collection Time    10/31/12  3:35 AM      Result Value Range   Prothrombin Time 30.4 (*) 11.6 - 15.2 seconds   INR 3.04 (*) 0.00 - 1.49  GLUCOSE, CAPILLARY     Status: Abnormal   Collection Time    10/31/12  7:55 AM      Result Value Range   Glucose-Capillary 142 (*) 70 - 99 mg/dL  GLUCOSE, CAPILLARY     Status: Abnormal   Collection Time    10/31/12 12:33 PM      Result Value Range   Glucose-Capillary 173 (*) 70 - 99 mg/dL  GLUCOSE, CAPILLARY     Status: Abnormal   Collection Time    10/31/12  5:41 PM      Result Value Range   Glucose-Capillary 168 (*) 70 - 99 mg/dL  GLUCOSE, CAPILLARY     Status: Abnormal   Collection Time    10/31/12  9:35 PM      Result Value Range   Glucose-Capillary 217 (*) 70 - 99 mg/dL   Comment 1 Notify RN    BASIC METABOLIC PANEL     Status: Abnormal   Collection Time    11/01/12  4:10 AM      Result Value Range    Sodium 133 (*) 135 - 145 mEq/L   Potassium 4.0  3.5 - 5.1 mEq/L   Chloride 109  96 - 112 mEq/L   CO2 16 (*) 19 - 32 mEq/L   Glucose, Bld 204 (*) 70 - 99 mg/dL   BUN 47 (*) 6 - 23 mg/dL   Creatinine, Ser 2.61 (*) 0.50 - 1.35 mg/dL   Calcium 8.0 (*) 8.4 - 10.5 mg/dL   GFR calc non Af Amer 26 (*) >90 mL/min   GFR calc Af Amer 30 (*) >90 mL/min   Comment: (NOTE)     The eGFR has been calculated using the CKD EPI equation.     This calculation has not been validated in all clinical situations.     eGFR's persistently <90 mL/min signify possible Chronic Kidney     Disease.  CBC     Status: Abnormal   Collection Time    11/01/12  4:10 AM      Result Value Range   WBC 4.3  4.0 - 10.5 K/uL   RBC 3.51 (*) 4.22 - 5.81 MIL/uL   Hemoglobin 9.1 (*) 13.0 - 17.0 g/dL   HCT 29.0 (*) 39.0 - 52.0 %   MCV 82.6  78.0 - 100.0 fL   MCH 25.9 (*)  26.0 - 34.0 pg   MCHC 31.4  30.0 - 36.0 g/dL   RDW 17.5 (*) 11.5 - 15.5 %   Platelets 179  150 - 400 K/uL  PROTIME-INR     Status: Abnormal   Collection Time    11/01/12  4:10 AM      Result Value Range   Prothrombin Time 22.8 (*) 11.6 - 15.2 seconds   INR 2.09 (*) 0.00 - 1.49  GLUCOSE, CAPILLARY     Status: Abnormal   Collection Time    11/01/12  7:47 AM      Result Value Range   Glucose-Capillary 151 (*) 70 - 99 mg/dL   Comment 1 Documented in Chart     Comment 2 Notify RN    GLUCOSE, CAPILLARY     Status: Abnormal   Collection Time    11/01/12 12:15 PM      Result Value Range   Glucose-Capillary 211 (*) 70 - 99 mg/dL   Comment 1 Documented in Chart     Comment 2 Notify RN    OCCULT BLOOD X 1 CARD TO LAB, STOOL     Status: None   Collection Time    11/01/12  2:52 PM      Result Value Range   Fecal Occult Bld NEGATIVE  NEGATIVE  GLUCOSE, CAPILLARY     Status: Abnormal   Collection Time    11/01/12  5:20 PM      Result Value Range   Glucose-Capillary 190 (*) 70 - 99 mg/dL  GLUCOSE, CAPILLARY     Status: Abnormal   Collection Time     11/01/12  9:57 PM      Result Value Range   Glucose-Capillary 171 (*) 70 - 99 mg/dL   Comment 1 Notify RN    OCCULT BLOOD X 1 CARD TO LAB, STOOL     Status: None   Collection Time    11/01/12 10:52 PM      Result Value Range   Fecal Occult Bld NEGATIVE  NEGATIVE  BASIC METABOLIC PANEL     Status: Abnormal   Collection Time    11/02/12  4:51 AM      Result Value Range   Sodium 134 (*) 135 - 145 mEq/L   Potassium 3.6  3.5 - 5.1 mEq/L   Chloride 106  96 - 112 mEq/L   CO2 21  19 - 32 mEq/L   Glucose, Bld 166 (*) 70 - 99 mg/dL   BUN 36 (*) 6 - 23 mg/dL   Creatinine, Ser 2.09 (*) 0.50 - 1.35 mg/dL   Calcium 8.2 (*) 8.4 - 10.5 mg/dL   GFR calc non Af Amer 34 (*) >90 mL/min   GFR calc Af Amer 40 (*) >90 mL/min   Comment: (NOTE)     The eGFR has been calculated using the CKD EPI equation.     This calculation has not been validated in all clinical situations.     eGFR's persistently <90 mL/min signify possible Chronic Kidney     Disease.  CBC     Status: Abnormal   Collection Time    11/02/12  4:51 AM      Result Value Range   WBC 4.3  4.0 - 10.5 K/uL   RBC 3.44 (*) 4.22 - 5.81 MIL/uL   Hemoglobin 8.8 (*) 13.0 - 17.0 g/dL   HCT 28.1 (*) 39.0 - 52.0 %   MCV 81.7  78.0 - 100.0 fL   MCH 25.6 (*) 26.0 -  34.0 pg   MCHC 31.3  30.0 - 36.0 g/dL   RDW 17.1 (*) 11.5 - 15.5 %   Platelets 164  150 - 400 K/uL  PROTIME-INR     Status: Abnormal   Collection Time    11/02/12  4:51 AM      Result Value Range   Prothrombin Time 20.6 (*) 11.6 - 15.2 seconds   INR 1.83 (*) 0.00 - 1.49  GLUCOSE, CAPILLARY     Status: Abnormal   Collection Time    11/02/12  7:20 AM      Result Value Range   Glucose-Capillary 140 (*) 70 - 99 mg/dL  GLUCOSE, CAPILLARY     Status: Abnormal   Collection Time    11/02/12 12:07 PM      Result Value Range   Glucose-Capillary 200 (*) 70 - 99 mg/dL   Comment 1 Documented in Chart     Comment 2 Notify RN    GLUCOSE, CAPILLARY     Status: Abnormal   Collection Time     11/02/12  5:28 PM      Result Value Range   Glucose-Capillary 199 (*) 70 - 99 mg/dL   Comment 1 Documented in Chart     Comment 2 Notify RN    GLUCOSE, CAPILLARY     Status: Abnormal   Collection Time    11/02/12 10:24 PM      Result Value Range   Glucose-Capillary 252 (*) 70 - 99 mg/dL  GLUCOSE, CAPILLARY     Status: Abnormal   Collection Time    11/03/12  7:33 AM      Result Value Range   Glucose-Capillary 176 (*) 70 - 99 mg/dL  BASIC METABOLIC PANEL     Status: Abnormal   Collection Time    11/03/12  7:48 AM      Result Value Range   Sodium 136  135 - 145 mEq/L   Potassium 3.7  3.5 - 5.1 mEq/L   Chloride 105  96 - 112 mEq/L   CO2 24  19 - 32 mEq/L   Glucose, Bld 175 (*) 70 - 99 mg/dL   BUN 29 (*) 6 - 23 mg/dL   Creatinine, Ser 1.81 (*) 0.50 - 1.35 mg/dL   Calcium 8.2 (*) 8.4 - 10.5 mg/dL   GFR calc non Af Amer 41 (*) >90 mL/min   GFR calc Af Amer 47 (*) >90 mL/min   Comment: (NOTE)     The eGFR has been calculated using the CKD EPI equation.     This calculation has not been validated in all clinical situations.     eGFR's persistently <90 mL/min signify possible Chronic Kidney     Disease.  PROTIME-INR     Status: Abnormal   Collection Time    11/03/12  7:48 AM      Result Value Range   Prothrombin Time 21.0 (*) 11.6 - 15.2 seconds   INR 1.87 (*) 0.00 - 1.49  GLUCOSE, CAPILLARY     Status: Abnormal   Collection Time    11/03/12 12:23 PM      Result Value Range   Glucose-Capillary 184 (*) 70 - 99 mg/dL   Comment 1 Documented in Chart     Comment 2 Notify RN    GLUCOSE, CAPILLARY     Status: Abnormal   Collection Time    11/03/12  5:09 PM      Result Value Range   Glucose-Capillary 191 (*) 70 - 99 mg/dL  GLUCOSE, CAPILLARY     Status: Abnormal   Collection Time    11/03/12 10:23 PM      Result Value Range   Glucose-Capillary 236 (*) 70 - 99 mg/dL   Comment 1 Notify RN    PROTIME-INR     Status: Abnormal   Collection Time    11/04/12  4:44 AM       Result Value Range   Prothrombin Time 22.7 (*) 11.6 - 15.2 seconds   INR 2.08 (*) 0.00 - 1.49  GLUCOSE, CAPILLARY     Status: Abnormal   Collection Time    11/04/12  7:29 AM      Result Value Range   Glucose-Capillary 185 (*) 70 - 99 mg/dL  GLUCOSE, CAPILLARY     Status: Abnormal   Collection Time    11/04/12 12:07 PM      Result Value Range   Glucose-Capillary 243 (*) 70 - 99 mg/dL  CBC WITH DIFFERENTIAL     Status: Abnormal   Collection Time    11/11/12  2:38 PM      Result Value Range   WBC 7.8  4.0 - 10.5 K/uL   RBC 3.95 (*) 4.22 - 5.81 MIL/uL   Hemoglobin 10.0 (*) 13.0 - 17.0 g/dL   HCT 31.0 (*) 39.0 - 52.0 %   MCV 78.5  78.0 - 100.0 fL   MCH 25.3 (*) 26.0 - 34.0 pg   MCHC 32.3  30.0 - 36.0 g/dL   RDW 17.2 (*) 11.5 - 15.5 %   Platelets 304  150 - 400 K/uL   Neutrophils Relative % 78 (*) 43 - 77 %   Neutro Abs 6.2  1.7 - 7.7 K/uL   Lymphocytes Relative 12  12 - 46 %   Lymphs Abs 0.9  0.7 - 4.0 K/uL   Monocytes Relative 7  3 - 12 %   Monocytes Absolute 0.5  0.1 - 1.0 K/uL   Eosinophils Relative 2  0 - 5 %   Eosinophils Absolute 0.2  0.0 - 0.7 K/uL   Basophils Relative 1  0 - 1 %   Basophils Absolute 0.0  0.0 - 0.1 K/uL   Smear Review Criteria for review not met    BASIC METABOLIC PANEL     Status: Abnormal   Collection Time    11/11/12  2:38 PM      Result Value Range   Sodium 138  135 - 145 mEq/L   Potassium 4.4  3.5 - 5.3 mEq/L   Chloride 105  96 - 112 mEq/L   CO2 22  19 - 32 mEq/L   Glucose, Bld 131 (*) 70 - 99 mg/dL   BUN 44 (*) 6 - 23 mg/dL   Creat 2.57 (*) 0.50 - 1.35 mg/dL   Calcium 8.9  8.4 - 10.5 mg/dL  HEPATIC FUNCTION PANEL     Status: Abnormal   Collection Time    11/11/12  2:38 PM      Result Value Range   Total Bilirubin 0.6  0.3 - 1.2 mg/dL   Bilirubin, Direct 0.2  0.0 - 0.3 mg/dL   Indirect Bilirubin 0.4  0.0 - 0.9 mg/dL   Alkaline Phosphatase 124 (*) 39 - 117 U/L   AST 10  0 - 37 U/L   ALT 12  0 - 53 U/L   Total Protein 6.5  6.0 - 8.3  g/dL   Albumin 3.1 (*) 3.5 - 5.2 g/dL  URINALYSIS, ROUTINE W REFLEX MICROSCOPIC  Status: Abnormal   Collection Time    11/11/12  2:38 PM      Result Value Range   Color, Urine RED (*) YELLOW   Comment: Biochemicals may be affected by the color of the urine.   APPearance TURBID (*) CLEAR   Specific Gravity, Urine 1.012  1.005 - 1.030   pH 5.5  5.0 - 8.0   Glucose, UA NEG  NEG mg/dL   Bilirubin Urine SMALL (*) NEG   Ketones, ur TRACE (*) NEG mg/dL   Hgb urine dipstick LARGE (*) NEG   Protein, ur 100 (*) NEG mg/dL   Urobilinogen, UA 1  0.0 - 1.0 mg/dL   Nitrite POS (*) NEG   Leukocytes, UA LARGE (*) NEG  LIPID PANEL     Status: Abnormal   Collection Time    11/11/12  2:38 PM      Result Value Range   Cholesterol 125  0 - 200 mg/dL   Comment: ATP III Classification:           < 200        mg/dL        Desirable          200 - 239     mg/dL        Borderline High          >= 240        mg/dL        High         Triglycerides 212 (*) <150 mg/dL   HDL 20 (*) >39 mg/dL   Total CHOL/HDL Ratio 6.3     VLDL 42 (*) 0 - 40 mg/dL   LDL Cholesterol 63  0 - 99 mg/dL   Comment:       Total Cholesterol/HDL Ratio:CHD Risk                            Coronary Heart Disease Risk Table                                            Men       Women              1/2 Average Risk              3.4        3.3                  Average Risk              5.0        4.4               2X Average Risk              9.6        7.1               3X Average Risk             23.4       11.0     Use the calculated Patient Ratio above and the CHD Risk table      to determine the patient's CHD Risk.     ATP III Classification (LDL):           < 100  mg/dL         Optimal          100 - 129     mg/dL         Near or Above Optimal          130 - 159     mg/dL         Borderline High          160 - 189     mg/dL         High           > 190        mg/dL         Very High        PROTIME-INR     Status:  Abnormal   Collection Time    11/11/12  2:38 PM      Result Value Range   Prothrombin Time 25.7 (*) 11.6 - 15.2 seconds   INR 2.46 (*) <1.50   Comment: The INR is of principal utility in following patients on stable doses     of oral anticoagulants.  The therapeutic range is generally 2.0 to     3.0, but may be 3.0 to 4.0 in patients with mechanical cardiac valves,     recurrent embolisms and antiphospholipid antibodies (including lupus     inhibitors).  HEMOGLOBIN A1C     Status: Abnormal   Collection Time    11/11/12  2:38 PM      Result Value Range   Hemoglobin A1C 6.8 (*) <5.7 %   Comment:                                                                            According to the ADA Clinical Practice Recommendations for 2011, when     HbA1c is used as a screening test:             >=6.5%   Diagnostic of Diabetes Mellitus                (if abnormal result is confirmed)           5.7-6.4%   Increased risk of developing Diabetes Mellitus           References:Diagnosis and Classification of Diabetes Mellitus,Diabetes     [id number] 1):S62-S69 and Standards of Medical Care in             Diabetes - 2011,Diabetes Care,2011,34 (Suppl 1):S11-S61.         Mean Plasma Glucose 148 (*) <117 mg/dL  URINALYSIS, MICROSCOPIC ONLY     Status: Abnormal   Collection Time    11/11/12  2:38 PM      Result Value Range   Squamous Epithelial / LPF RARE  RARE   Crystals NONE SEEN  NONE SEEN   Casts NONE SEEN  NONE SEEN   WBC, UA >50 (*) <3 WBC/hpf   RBC / HPF >50 (*) <3 RBC/hpf   Bacteria, UA NONE SEEN  RARE  TSH     Status: Abnormal   Collection Time    11/11/12  2:55 PM      Result Value Range   TSH 4.744 (*)  0.350 - 4.500 uIU/mL  CULTURE, URINE COMPREHENSIVE     Status: None   Collection Time    11/12/12  2:57 PM      Result Value Range   Colony Count NO GROWTH     Organism ID, Bacteria NO GROWTH    CBC WITH DIFFERENTIAL     Status: Abnormal   Collection Time    12/09/12   3:45 PM      Result Value Range   WBC 4.4  4.0 - 10.5 K/uL   RBC 3.30 (*) 4.22 - 5.81 MIL/uL   Hemoglobin 8.7 (*) 13.0 - 17.0 g/dL   HCT 27.0 (*) 39.0 - 52.0 %   MCV 81.8  78.0 - 100.0 fL   MCH 26.4  26.0 - 34.0 pg   MCHC 32.2  30.0 - 36.0 g/dL   RDW 18.9 (*) 11.5 - 15.5 %   Platelets 198  150 - 400 K/uL   Neutrophils Relative % 73  43 - 77 %   Neutro Abs 3.2  1.7 - 7.7 K/uL   Lymphocytes Relative 15  12 - 46 %   Lymphs Abs 0.6 (*) 0.7 - 4.0 K/uL   Monocytes Relative 9  3 - 12 %   Monocytes Absolute 0.4  0.1 - 1.0 K/uL   Eosinophils Relative 3  0 - 5 %   Eosinophils Absolute 0.2  0.0 - 0.7 K/uL   Basophils Relative 0  0 - 1 %   Basophils Absolute 0.0  0.0 - 0.1 K/uL   Smear Review Criteria for review not met    COMPREHENSIVE METABOLIC PANEL     Status: Abnormal   Collection Time    12/09/12  3:45 PM      Result Value Range   Sodium 137  135 - 145 mEq/L   Potassium 4.3  3.5 - 5.3 mEq/L   Chloride 109  96 - 112 mEq/L   CO2 20  19 - 32 mEq/L   Glucose, Bld 222 (*) 70 - 99 mg/dL   BUN 54 (*) 6 - 23 mg/dL   Creat 3.20 (*) 0.50 - 1.35 mg/dL   Total Bilirubin 0.4  0.3 - 1.2 mg/dL   Alkaline Phosphatase 80  39 - 117 U/L   AST 10  0 - 37 U/L   ALT 8  0 - 53 U/L   Total Protein 6.3  6.0 - 8.3 g/dL   Albumin 3.3 (*) 3.5 - 5.2 g/dL   Calcium 8.9  8.4 - 10.5 mg/dL  TSH     Status: None   Collection Time    12/09/12  3:45 PM      Result Value Range   TSH 0.368  0.350 - 4.500 uIU/mL  PROTIME-INR     Status: Abnormal   Collection Time    12/09/12  3:45 PM      Result Value Range   Prothrombin Time 20.7 (*) 11.6 - 15.2 seconds   INR 1.84 (*) <1.50   Comment: The INR is of principal utility in following patients on stable doses     of oral anticoagulants.  The therapeutic range is generally 2.0 to     3.0, but may be 3.0 to 4.0 in patients with mechanical cardiac valves,     recurrent embolisms and antiphospholipid antibodies (including lupus     inhibitors).     Assessment/Plan: On anticoagulant therapy Will repeat PT/INR today.  Patient reports INR of 2.6 checked on home machine earlier this week.  Unfortunately giving patient CKD, other anticoagulants are not good options for him at present.  Will tailor therapy and arrange follow-up based on patient's results.

## 2013-01-03 NOTE — Assessment & Plan Note (Signed)
Will repeat PT/INR today.  Patient reports INR of 2.6 checked on home machine earlier this week.  Unfortunately giving patient CKD, other anticoagulants are not good options for him at present.  Will tailor therapy and arrange follow-up based on patient's results.

## 2013-01-06 ENCOUNTER — Telehealth: Payer: Self-pay

## 2013-01-06 NOTE — Telephone Encounter (Signed)
Left patient a voice mail about labs. Instructed patient to call back if he had any questions or concerns.

## 2013-01-06 NOTE — Telephone Encounter (Signed)
Message copied by Hanley Seamen on Mon Jan 06, 2013  1:01 PM ------      Message from: Raiford Noble      Created: Sat Jan 04, 2013 10:08 AM       Please inform patient that his INR looks good.  He should continue current regimen and return for repeat PT/INR in 3-4 weeks. ------

## 2013-01-13 ENCOUNTER — Other Ambulatory Visit: Payer: Self-pay | Admitting: Physician Assistant

## 2013-01-13 DIAGNOSIS — E119 Type 2 diabetes mellitus without complications: Secondary | ICD-10-CM

## 2013-01-13 MED ORDER — INSULIN GLARGINE 100 UNIT/ML ~~LOC~~ SOLN: SUBCUTANEOUS | Status: DC

## 2013-01-13 NOTE — Telephone Encounter (Signed)
Rx request to pharmacy/SLS  

## 2013-01-31 DIAGNOSIS — Z01810 Encounter for preprocedural cardiovascular examination: Secondary | ICD-10-CM | POA: Diagnosis not present

## 2013-01-31 DIAGNOSIS — E119 Type 2 diabetes mellitus without complications: Secondary | ICD-10-CM | POA: Diagnosis not present

## 2013-01-31 DIAGNOSIS — I10 Essential (primary) hypertension: Secondary | ICD-10-CM | POA: Diagnosis not present

## 2013-01-31 DIAGNOSIS — R791 Abnormal coagulation profile: Secondary | ICD-10-CM | POA: Diagnosis not present

## 2013-01-31 DIAGNOSIS — N200 Calculus of kidney: Secondary | ICD-10-CM | POA: Diagnosis not present

## 2013-01-31 DIAGNOSIS — Z5309 Procedure and treatment not carried out because of other contraindication: Secondary | ICD-10-CM | POA: Diagnosis not present

## 2013-02-06 DIAGNOSIS — N3010 Interstitial cystitis (chronic) without hematuria: Secondary | ICD-10-CM | POA: Insufficient documentation

## 2013-02-06 DIAGNOSIS — N289 Disorder of kidney and ureter, unspecified: Secondary | ICD-10-CM | POA: Diagnosis not present

## 2013-02-06 DIAGNOSIS — N200 Calculus of kidney: Secondary | ICD-10-CM | POA: Diagnosis not present

## 2013-02-06 DIAGNOSIS — R319 Hematuria, unspecified: Secondary | ICD-10-CM | POA: Diagnosis not present

## 2013-02-06 DIAGNOSIS — R109 Unspecified abdominal pain: Secondary | ICD-10-CM | POA: Diagnosis not present

## 2013-02-11 ENCOUNTER — Telehealth: Payer: Self-pay | Admitting: *Deleted

## 2013-02-11 NOTE — Telephone Encounter (Signed)
Pt left message that he is going to be having his kidney stone blasted on 02/17/13 by Dr Alona Bene. Pt states they told him he would need to stop his coumadin prior to the procedure and pt is wanting to know exactly when he should stop it?  Pt stated we could leave a detailed message.  Attempted to reach pt and left message that he should contact the specialist to find out how many days before the procedure they are recommending he stop the medication and are they requiring any type of surgical clearance?

## 2013-02-12 ENCOUNTER — Encounter: Payer: Self-pay | Admitting: Gastroenterology

## 2013-02-13 ENCOUNTER — Other Ambulatory Visit: Payer: Self-pay | Admitting: Physician Assistant

## 2013-02-13 NOTE — Telephone Encounter (Signed)
Attempt to reach triage nurse for call back RE: verify pt's surgery procedure, if need for medical clearance & inform needs to be off Coumadin [5] days and have Heparin bridge; their office hours are 8:30a-4:30p, will call back tomorrow morning/SLS

## 2013-02-13 NOTE — Telephone Encounter (Signed)
[facility] office called regarding this. She states that she does need a return call on this and when we call to ask for a Triage Nurse. Phone# [phone]

## 2013-02-13 NOTE — Telephone Encounter (Signed)
Rx request to pharmacy/SLS  

## 2013-02-13 NOTE — Telephone Encounter (Signed)
Can we please try to contact the patient again to reiterate the message.

## 2013-02-14 ENCOUNTER — Other Ambulatory Visit: Payer: Self-pay | Admitting: Physician Assistant

## 2013-02-14 NOTE — Telephone Encounter (Addendum)
LMOM with contact name and number for return call RE: New surgery date with [facility] and further provider instructions regarding Hold of Coumadin [5] days prior & a bridge of Lovenox during this period before new surgery date/SLS Spoken with Triage nurse prior, will reschedule pt's surgery/SLS

## 2013-02-14 NOTE — Telephone Encounter (Signed)
PATIENT CALLED TO STATE HE HAS BEEN OFF COUMADIN SINCE Tuesday./   HE HAS NOT HAD ANY LOVENOX EITHER.  HE WANTS TO GET HIS KIDNEY STONES  DONE PLEASE ADVISE ABOUT THE THINNERS

## 2013-02-14 NOTE — Telephone Encounter (Signed)
Duplicate Request, receipt confirmed 12.18.14 at 4:25pm/SLS

## 2013-02-14 NOTE — Telephone Encounter (Signed)
Attempt to reach provider, LMOM with pt's message; d/t patient needing to be ON Lovenox for bridging, along with the [5] day Hold on Coumadin/SLS 06:08pm

## 2013-02-17 DIAGNOSIS — B3749 Other urogenital candidiasis: Secondary | ICD-10-CM | POA: Diagnosis present

## 2013-02-17 DIAGNOSIS — I10 Essential (primary) hypertension: Secondary | ICD-10-CM | POA: Diagnosis not present

## 2013-02-17 DIAGNOSIS — E039 Hypothyroidism, unspecified: Secondary | ICD-10-CM | POA: Diagnosis present

## 2013-02-17 DIAGNOSIS — B9689 Other specified bacterial agents as the cause of diseases classified elsewhere: Secondary | ICD-10-CM | POA: Diagnosis not present

## 2013-02-17 DIAGNOSIS — D638 Anemia in other chronic diseases classified elsewhere: Secondary | ICD-10-CM | POA: Diagnosis present

## 2013-02-17 DIAGNOSIS — N19 Unspecified kidney failure: Secondary | ICD-10-CM | POA: Diagnosis not present

## 2013-02-17 DIAGNOSIS — Z993 Dependence on wheelchair: Secondary | ICD-10-CM | POA: Diagnosis not present

## 2013-02-17 DIAGNOSIS — B951 Streptococcus, group B, as the cause of diseases classified elsewhere: Secondary | ICD-10-CM | POA: Diagnosis present

## 2013-02-17 DIAGNOSIS — N3289 Other specified disorders of bladder: Secondary | ICD-10-CM | POA: Diagnosis present

## 2013-02-17 DIAGNOSIS — Z86711 Personal history of pulmonary embolism: Secondary | ICD-10-CM | POA: Diagnosis not present

## 2013-02-17 DIAGNOSIS — E119 Type 2 diabetes mellitus without complications: Secondary | ICD-10-CM | POA: Diagnosis present

## 2013-02-17 DIAGNOSIS — N179 Acute kidney failure, unspecified: Secondary | ICD-10-CM | POA: Diagnosis not present

## 2013-02-17 DIAGNOSIS — E785 Hyperlipidemia, unspecified: Secondary | ICD-10-CM | POA: Diagnosis present

## 2013-02-17 DIAGNOSIS — Z7901 Long term (current) use of anticoagulants: Secondary | ICD-10-CM | POA: Diagnosis not present

## 2013-02-17 DIAGNOSIS — D62 Acute posthemorrhagic anemia: Secondary | ICD-10-CM | POA: Diagnosis not present

## 2013-02-17 DIAGNOSIS — N200 Calculus of kidney: Secondary | ICD-10-CM | POA: Diagnosis not present

## 2013-02-17 DIAGNOSIS — Z86718 Personal history of other venous thrombosis and embolism: Secondary | ICD-10-CM | POA: Diagnosis not present

## 2013-02-17 DIAGNOSIS — N3010 Interstitial cystitis (chronic) without hematuria: Secondary | ICD-10-CM | POA: Diagnosis not present

## 2013-02-17 NOTE — Telephone Encounter (Signed)
LMOM with contact name and number for return call RE: Medication pre-Tx prior to Sx at [facility] [originally scheduled for today] and further provider instructions.  Patient will need to have PT/INR checked [d/t note stating that he has been off of Coumadin since last Tues, 12.16.14], he will then need to be off of Coumadin [if levels are at desired range] 5 days prior to surgery, with Lovenox bridge injection done 24 hours after Coumadin has been stopped [and then Lovenox stopped [1] day prior to surgery date. Will contact [facility] again and speak with triage about this matter [and not reaching patient as of yet]/SLS

## 2013-02-17 NOTE — Telephone Encounter (Signed)
Spoke w/Penny at [facility]; could not get ahold of triage nurse; sending Urgent message for call back/SLS

## 2013-02-18 NOTE — Telephone Encounter (Signed)
FYI: Spoke with [name] at [facility] RE: information on rescheduling of patient's surgery originally scheduled for Mon, 12.22.14, as pt is needing Coumadin Hold & Lovenox therapy and we now need to check his Coumadin levels beforehand, as pt went off of medication w/o provider authority; and that this message was given to their Triage on 12.19.14 and pt's Sx was to be rescheduled accordingly. I was then informed that pt was placed back on [facility] surgery schedule as an add-on yesterday, 12.22.14 & THEY PERFORMED THE SURGERY.? Reiterated again that this surgery was not suppose to have been done until clearance from PCP and that this information would be forwarded to provider/SLS

## 2013-03-05 ENCOUNTER — Ambulatory Visit: Payer: Medicare Other | Admitting: Physician Assistant

## 2013-03-07 ENCOUNTER — Ambulatory Visit (INDEPENDENT_AMBULATORY_CARE_PROVIDER_SITE_OTHER): Payer: Medicare Other | Admitting: Physician Assistant

## 2013-03-07 ENCOUNTER — Encounter: Payer: Self-pay | Admitting: Physician Assistant

## 2013-03-07 VITALS — BP 102/78 | HR 87 | Temp 98.3°F | Resp 18 | Ht 71.0 in

## 2013-03-07 DIAGNOSIS — Z7901 Long term (current) use of anticoagulants: Secondary | ICD-10-CM | POA: Diagnosis not present

## 2013-03-07 DIAGNOSIS — N200 Calculus of kidney: Secondary | ICD-10-CM | POA: Diagnosis not present

## 2013-03-07 DIAGNOSIS — N183 Chronic kidney disease, stage 3 unspecified: Secondary | ICD-10-CM | POA: Diagnosis not present

## 2013-03-07 DIAGNOSIS — N10 Acute tubulo-interstitial nephritis: Secondary | ICD-10-CM | POA: Diagnosis not present

## 2013-03-07 NOTE — Patient Instructions (Signed)
I will call you with your lab results.  You need to call your urologist on Monday to schedule a hospital/surgery follow-up.  Continue medications as prescribed.  Take a fiber supplement and probiotic.

## 2013-03-07 NOTE — Progress Notes (Signed)
Pre visit review using our clinic review tool, if applicable. No additional management support is needed unless otherwise documented below in the visit note/SLS  

## 2013-03-09 NOTE — Progress Notes (Signed)
MRN: [id number] Name: [name]  Sex: male Age: 58 y.o. DOB: 1955-12-26  [place] #: Karren Burly Facility/Room: Level Of Care: SNF Provider: Inocencio Homes D Emergency Contacts: Extended Emergency Contact Information Primary Emergency Contact: Malinoski,Sandra Address: [place], [place] [address] Johnnette Litter of [place] Phone: [phone] Work Phone: [phone] Mobile Phone: [phone] Relation: Spouse    Allergies: Review of patient's allergies indicates no known allergies.  Chief Complaint  Patient presents with  . Discharge Note    HPI: Patient is 58 y.o. male who was admitted to SNF for rehab after acute renal failure, along with other medical problems, who is now ready for discharge.  Past Medical History  Diagnosis Date  . Sleep apnea     uses cpap  . Hypothyroidism   . Anemia   . Blood transfusion   . Chronic kidney disease     hx of kidney stones  . Hypertension   . Arthritis     osteoarthritis  . Pneumonia   . Diabetes mellitus     insulin dependent  . DVT (deep vein thrombosis) in pregnancy   . Peripheral vascular disease   . Hyperlipidemia   . Morbid obesity   . Hypertrophy of prostate   . Acute respiratory failure   . Cellulitis and abscess of leg   . Chronic kidney disease, stage III (moderate)   . Nephrolithiasis   . Anxiety   . Chronic pain     Past Surgical History  Procedure Laterality Date  . Bilteral hip      replacement & revison  's   . Knee surgery      left knee    . Tonsillectomy    . Amputation  02/07/2011    Procedure: AMPUTATION BELOW KNEE;  Surgeon: Newt Minion, MD;  Location: [facility];  Service: Orthopedics;  Laterality: Left;  Left Below Knee Amputation  . Cystoscopy w/ retrogrades Bilateral 10/24/2012    Procedure: CYSTOSCOPY WITH RETROGRADE PYELOGRAM Procedure: Cystoscopy, Bilateral Retrogarde Pyelograms, Bladder Biopsy, Hydrodistension;  Surgeon: Franchot Gallo, MD;  Location: [facility];   Service: Urology;  Laterality: Bilateral;      Medication List       This list is accurate as of: 10/22/12 11:59 PM.  Always use your most recent med list.               acetaminophen 325 MG tablet  Commonly known as:  TYLENOL  Take 650 mg by mouth every 4 (four) hours as needed for pain (dont exceed 3000mg  per 24 hour period).     atorvastatin 40 MG tablet  Commonly known as:  LIPITOR  Take 40 mg by mouth every evening. Patient takes in the evening     cephALEXin 500 MG capsule  Commonly known as:  KEFLEX  Take 500 mg by mouth daily.     cyclobenzaprine 5 MG tablet  Commonly known as:  FLEXERIL  Take 5 mg by mouth 3 times/day as needed-between meals & bedtime.     ferrous sulfate 325 (65 FE) MG tablet  Take 325 mg by mouth 2 (two) times daily.     insulin aspart 100 UNIT/ML injection  Commonly known as:  novoLOG  Inject 5 Units into the skin 3 (three) times daily with meals. And q HS for BS> 150.     insulin glargine 100 UNIT/ML injection  Commonly known as:  LANTUS  Inject 40 Units into the skin at  bedtime.     levothyroxine 200 MCG tablet  Commonly known as:  SYNTHROID, LEVOTHROID  Take 200 mcg by mouth every morning.     lidocaine 2 % jelly  Commonly known as:  XYLOCAINE  Apply topically every 8 (eight) hours as needed (urethral pain).     miconazole 2 % cream  Commonly known as:  MICOTIN  Apply 1 application topically every 8 (eight) hours as needed (for wound).     mirabegron ER 25 MG Tb24 tablet  Commonly known as:  MYRBETRIQ  Take 25 mg by mouth every morning.     multivitamins ther. w/minerals Tabs tablet  Take 1 tablet by mouth daily.     pantoprazole 40 MG tablet  Commonly known as:  PROTONIX  Take 40 mg by mouth daily.     phenazopyridine 100 MG tablet  Commonly known as:  PYRIDIUM  Take 100 mg by mouth 3 (three) times daily as needed for pain (for candidiasis).     tamsulosin 0.4 MG Caps capsule  Commonly known as:  FLOMAX  Take 0.4 mg  by mouth at bedtime.     traMADol 50 MG tablet  Commonly known as:  ULTRAM  Take 50 mg by mouth every 6 (six) hours as needed for pain.        Meds ordered this encounter  Medications  . DISCONTD: warfarin (COUMADIN) 1 MG tablet    Sig: Take 0.5 mg by mouth as directed.  Marland Kitchen DISCONTD: insulin glargine (LANTUS) 100 UNIT/ML injection    Sig: Inject 40 Units into the skin at bedtime.   Marland Kitchen DISCONTD: insulin aspart (NOVOLOG) 100 UNIT/ML injection    Sig: Inject 5 Units into the skin 3 (three) times daily with meals. And q HS for BS> 150.    Immunization History  Administered Date(s) Administered  . Pneumococcal Polysaccharide-23 08/15/2012    History  Substance Use Topics  . Smoking status: Never Smoker   . Smokeless tobacco: Never Used  . Alcohol Use: No    Filed Vitals:   10/22/12 1412  BP: 110/76  Pulse: 81  Temp: 97.8 F (36.6 C)  Resp: 20    Physical Exam  GENERAL APPEARANCE: Alert, conversant. Appropriately groomed. No acute distress.  HEENT: Unremarkable. RESPIRATORY: Breathing is even, unlabored. Lung sounds are clear   CARDIOVASCULAR: Heart RRR no murmurs, rubs or gallops. No peripheral edema.  GASTROINTESTINAL: Abdomen is soft, non-tender, not distended w/ normal bowel sounds.  NEUROLOGIC: Cranial nerves 2-12 grossly intact.   Patient Active Problem List   Diagnosis Date Noted  . On anticoagulant therapy 12/09/2012  . Preventive measure 11/13/2012  . Renal failure, acute on chronic 10/30/2012  . Septic shock 10/29/2012  . UI (urinary incontinence) 10/29/2012  . 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 CKD (chronic kidney disease) stage 3, GFR 30-59 ml/min 08/18/2012  . AKI (acute kidney injury) 08/14/2012  . Cellulitis 08/14/2012  . Nephrolithiasis 08/14/2012  . Acute respiratory failure with hypoxia 08/14/2012  . Hyperkalemia 08/04/2012  . Metabolic acidosis [id number]  . Hypoglycemia associated with diabetes 08/04/2012  . Hypothyroidism 08/04/2012   . HYPERLIPIDEMIA 09/21/2009  . OBESITY 09/21/2009  . Acute posthemorrhagic anemia 09/21/2009  . ANEMIA-UNSPECIFIED 09/21/2009  . HYPERTENSION 09/21/2009  . DIVERTICULOSIS OF COLON 09/21/2009  . BLOOD IN STOOL 09/21/2009  . DIABETES MELLITUS-TYPE II 10/01/2007  . DVT 10/01/2007    CBC    Component Value Date/Time   WBC 4.4 12/09/2012 1545   WBC 5.0 03/10/2008 1321   RBC  3.30* 12/09/2012 1545   RBC 3.20* 06/13/2010 0500   HGB 8.7* 12/09/2012 1545   HGB 8.9* 03/10/2008 1321   HCT 27.0* 12/09/2012 1545   HCT 27.5* 03/10/2008 1321   PLT 198 12/09/2012 1545   PLT 218 03/10/2008 1321   MCV 81.8 12/09/2012 1545   MCV 73* 03/10/2008 1321   LYMPHSABS 0.6* 12/09/2012 1545   LYMPHSABS 0.6* 03/10/2008 1321   MONOABS 0.4 12/09/2012 1545   EOSABS 0.2 12/09/2012 1545   EOSABS 0.2 03/10/2008 1321   BASOSABS 0.0 12/09/2012 1545   BASOSABS 0.0 03/10/2008 1321    CMP     Component Value Date/Time   NA 137 12/09/2012 1545   NA 140 03/10/2008 1321   K 4.3 12/09/2012 1545   K 4.1 03/10/2008 1321   CL 109 12/09/2012 1545   CL 99 03/10/2008 1321   CO2 20 12/09/2012 1545   CO2 30 03/10/2008 1321   GLUCOSE 222* 12/09/2012 1545   GLUCOSE 163* 03/10/2008 1321   BUN 54* 12/09/2012 1545   BUN 14 03/10/2008 1321   CREATININE 3.20* 12/09/2012 1545   CREATININE 1.81* 11/03/2012 0748   CALCIUM 8.9 12/09/2012 1545   CALCIUM 9.4 03/10/2008 1321   PROT 6.3 12/09/2012 1545   PROT 6.9 03/10/2008 1321   ALBUMIN 3.3* 12/09/2012 1545   AST 10 12/09/2012 1545   AST 21 03/10/2008 1321   ALT 8 12/09/2012 1545   ALT 20 03/10/2008 1321   ALKPHOS 80 12/09/2012 1545   ALKPHOS 69 03/10/2008 1321   BILITOT 0.4 12/09/2012 1545   BILITOT 0.80 03/10/2008 1321   GFRNONAA 41* 11/03/2012 0748   GFRAA 47* 11/03/2012 0748    Assessment and Plan  Patient is being discharged to home in stable and improved condition.  Hennie Duos, MD

## 2013-03-10 ENCOUNTER — Telehealth: Payer: Self-pay | Admitting: Physician Assistant

## 2013-03-10 LAB — CBC WITH DIFFERENTIAL/PLATELET
Basophils Absolute: 0 10*3/uL (ref 0.0–0.1)
Basophils Relative: 1 % (ref 0–1)
Eosinophils Absolute: 0.1 10*3/uL (ref 0.0–0.7)
Eosinophils Relative: 2 % (ref 0–5)
HCT: 27.1 % — ABNORMAL LOW (ref 39.0–52.0)
Hemoglobin: 8.4 g/dL — ABNORMAL LOW (ref 13.0–17.0)
LYMPHS ABS: 0.7 10*3/uL (ref 0.7–4.0)
LYMPHS PCT: 13 % (ref 12–46)
MCH: 28 pg (ref 26.0–34.0)
MCHC: 31 g/dL (ref 30.0–36.0)
MCV: 90.3 fL (ref 78.0–100.0)
MONO ABS: 0.4 10*3/uL (ref 0.1–1.0)
Monocytes Relative: 7 % (ref 3–12)
Neutro Abs: 4.2 10*3/uL (ref 1.7–7.7)
Neutrophils Relative %: 72 % (ref 43–77)
Platelets: 209 10*3/uL (ref 150–400)
RBC: 3 MIL/uL — AB (ref 4.22–5.81)
RDW: 17.4 % — ABNORMAL HIGH (ref 11.5–15.5)
WBC: 5.4 10*3/uL (ref 4.0–10.5)

## 2013-03-10 LAB — URINALYSIS, ROUTINE W REFLEX MICROSCOPIC
BILIRUBIN URINE: NEGATIVE
GLUCOSE, UA: NEGATIVE mg/dL
Ketones, ur: NEGATIVE mg/dL
Nitrite: NEGATIVE
Protein, ur: 100 mg/dL — AB
Specific Gravity, Urine: 1.013 (ref 1.005–1.030)
Urobilinogen, UA: 0.2 mg/dL (ref 0.0–1.0)
pH: 6 (ref 5.0–8.0)

## 2013-03-10 LAB — BASIC METABOLIC PANEL WITH GFR
BUN: 36 mg/dL — AB (ref 6–23)
CHLORIDE: 105 meq/L (ref 96–112)
CO2: 20 meq/L (ref 19–32)
CREATININE: 2.98 mg/dL — AB (ref 0.50–1.35)
Calcium: 8 mg/dL — ABNORMAL LOW (ref 8.4–10.5)
GFR, Est African American: 26 mL/min — ABNORMAL LOW
GFR, Est Non African American: 23 mL/min — ABNORMAL LOW
Glucose, Bld: 127 mg/dL — ABNORMAL HIGH (ref 70–99)
Potassium: 4.2 mEq/L (ref 3.5–5.3)
Sodium: 137 mEq/L (ref 135–145)

## 2013-03-10 LAB — URINALYSIS, MICROSCOPIC ONLY
Bacteria, UA: NONE SEEN
CASTS: NONE SEEN
CRYSTALS: NONE SEEN
RBC / HPF: >50 RBC/hpf — AB (ref <3)
Squamous Epithelial / LPF: NONE SEEN
WBC, UA: >50 WBC/hpf — AB (ref <3)

## 2013-03-10 LAB — PROTIME-INR
INR: 1.78 — ABNORMAL HIGH (ref <1.50)
Prothrombin Time: 20.3 seconds — ABNORMAL HIGH (ref 11.6–15.2)

## 2013-03-10 NOTE — Telephone Encounter (Signed)
Patient states he has spoken with Dr Amalia Hailey office but does not know exact  Date of upcoming surgery.   Will get back with you when he does for instructions as to what to do about weaning off coumaden

## 2013-03-12 DIAGNOSIS — E669 Obesity, unspecified: Secondary | ICD-10-CM | POA: Diagnosis not present

## 2013-03-12 DIAGNOSIS — Z435 Encounter for attention to cystostomy: Secondary | ICD-10-CM | POA: Diagnosis not present

## 2013-03-12 DIAGNOSIS — N200 Calculus of kidney: Secondary | ICD-10-CM | POA: Diagnosis not present

## 2013-03-12 DIAGNOSIS — Z7901 Long term (current) use of anticoagulants: Secondary | ICD-10-CM | POA: Diagnosis not present

## 2013-03-12 DIAGNOSIS — S88119A Complete traumatic amputation at level between knee and ankle, unspecified lower leg, initial encounter: Secondary | ICD-10-CM | POA: Diagnosis not present

## 2013-03-12 DIAGNOSIS — L97209 Non-pressure chronic ulcer of unspecified calf with unspecified severity: Secondary | ICD-10-CM | POA: Diagnosis not present

## 2013-03-12 DIAGNOSIS — Z794 Long term (current) use of insulin: Secondary | ICD-10-CM | POA: Diagnosis not present

## 2013-03-12 DIAGNOSIS — I872 Venous insufficiency (chronic) (peripheral): Secondary | ICD-10-CM | POA: Diagnosis not present

## 2013-03-12 DIAGNOSIS — M6281 Muscle weakness (generalized): Secondary | ICD-10-CM | POA: Diagnosis not present

## 2013-03-12 DIAGNOSIS — N10 Acute tubulo-interstitial nephritis: Secondary | ICD-10-CM | POA: Insufficient documentation

## 2013-03-12 DIAGNOSIS — Z86718 Personal history of other venous thrombosis and embolism: Secondary | ICD-10-CM | POA: Diagnosis not present

## 2013-03-12 DIAGNOSIS — E119 Type 2 diabetes mellitus without complications: Secondary | ICD-10-CM | POA: Diagnosis not present

## 2013-03-12 DIAGNOSIS — L97809 Non-pressure chronic ulcer of other part of unspecified lower leg with unspecified severity: Secondary | ICD-10-CM | POA: Diagnosis not present

## 2013-03-12 LAB — URINE CULTURE: Colony Count: 9000

## 2013-03-12 NOTE — Assessment & Plan Note (Signed)
Repeat PT/INR.  Unfortunately due to CKD patient is not a candidate for other anticoagulants.  Patient has history of multiple DVT and PE. Patient is wheelchair-bound.  Unfortunately, patient needs lifelong anticoagulation.

## 2013-03-12 NOTE — Telephone Encounter (Signed)
Patient called back wanting his results

## 2013-03-12 NOTE — Assessment & Plan Note (Signed)
Recheck BMP with GFR.  Repeat Urinalysis.  Follow-up with Urologist and Nephrologist.

## 2013-03-12 NOTE — Telephone Encounter (Signed)
Pt left message on voicemail that he will be having procedure for kidney stones on 03/21/13 and he would like to be bridged with Lovenox. Thinks they would like his warfarin stopped 5 days before procedure.  Please advise.

## 2013-03-12 NOTE — Assessment & Plan Note (Signed)
Nephrolithotomy to be rescheduled.  Catheter with good urinary output.  No pain at present.

## 2013-03-12 NOTE — Assessment & Plan Note (Signed)
Recheck CBC, BMP, UA and Urine Culture.  Follow-up with Urology and Nephrology.

## 2013-03-12 NOTE — Telephone Encounter (Signed)
Will need to stop Coumadin 5 days prior to surgery.  Will need to start Lovenox the next day.  Patient will need to be seen at coumadin clinic for Lovenox bridge.  Get patient to choose a location so we can get him set up.

## 2013-03-12 NOTE — Progress Notes (Signed)
Patient is a 58 y.o. caucasian male with PMH significant for ESRD, Nephorlithiasis, DVT and PE requiring chronic anticoagulation with warfarin who presents to clinic today for hospital follow-up.  Patient was scheduled for Nephrolithotomy but surgery was canceled due to significant infection of kidneys. Patient was placed on IV antibiotic and stabilzied before discharge.  Patient discharged with PO antibiotics.  Patient endorses taking antibiotic as prescribed.  Denies fever, chills, malaise/fatigue.  Denies blood noted in catheter bag.  States his urine is looking better. Patient was supposed to follow-up with his Urologist within a week after discharge.  Patient did not schedule an appointment. Patient also overdue for hospital follow-up with [place].  Patient has a history of noncompliance.  Patient is currently back on his coumadin, 40m daily.  Has not had his PT/INR checked since hospital discharge ~ 2 weeks ago.  Patient states he is feeling great.   Past Medical History  Diagnosis Date  . Sleep apnea     uses cpap  . Hypothyroidism   . Anemia   . Blood transfusion   . Chronic kidney disease     hx of kidney stones  . Hypertension   . Arthritis     osteoarthritis  . Pneumonia   . Diabetes mellitus     insulin dependent  . DVT (deep vein thrombosis) in pregnancy   . Peripheral vascular disease   . Hyperlipidemia   . Morbid obesity   . Hypertrophy of prostate   . Acute respiratory failure   . Cellulitis and abscess of leg   . Chronic kidney disease, stage III (moderate)   . Nephrolithiasis   . Anxiety   . Chronic pain     Current Outpatient Prescriptions on File Prior to Visit  Medication Sig Dispense Refill  . atorvastatin (LIPITOR) 40 MG tablet TAKE 1 TABLET EVERY DAY  30 tablet  2  . ferrous sulfate 325 (65 FE) MG tablet Take 325 mg by mouth 2 (two) times daily.       .Marland KitchenHYDROcodone-acetaminophen (NORCO/VICODIN) 5-325 MG per tablet Take 1 tablet by mouth every 6 (six) hours as  needed for pain.  60 tablet  0  . insulin aspart (NOVOLOG) 100 UNIT/ML injection INJECT 3 UNITS THREE TIMES DAILY WITH MEALS. AND AT NIGHTIME IF BLOOD SUGARS GREATER THAN 150  20 mL  2  . insulin glargine (LANTUS) 100 UNIT/ML injection PATIENT REPORTS THAT HE TAKES 40 UNITS DAILY.  40 mL  5  . Insulin Syringe-Needle U-100 (INSULIN SYRINGE .5CC/31GX5/16") 31G X 5/16" 0.5 ML MISC USE AS DIRECTED TO INJECT INSULIN FOUR TIMES DAILY Dx: 250.00  120 each  5  . levothyroxine (SYNTHROID, LEVOTHROID) 25 MCG tablet TAKE 1 TABLET (25 MCG TOTAL) BY MOUTH DAILY BEFORE BREAKFAST.  30 tablet  1  . lidocaine (XYLOCAINE) 2 % jelly Apply topically every 8 (eight) hours as needed (urethral pain).  30 mL  0  . Multiple Vitamins-Minerals (MULTIVITAMINS THER. W/MINERALS) TABS Take 1 tablet by mouth daily.       . tamsulosin (FLOMAX) 0.4 MG CAPS capsule Take 0.4 mg by mouth at bedtime.       .Marland Kitchenwarfarin (COUMADIN) 2 MG tablet Take 1 tablet (2 mg total) by mouth daily at 6 PM.  30 tablet  2  . cyclobenzaprine (FLEXERIL) 5 MG tablet Take 5 mg by mouth 3 times/day as needed-between meals & bedtime.       . docusate sodium 100 MG CAPS Take 100 mg by mouth 2 (two)  times daily.       No current facility-administered medications on file prior to visit.    No Known Allergies  Family History  Problem Relation Age of Onset  . Diabetes Mother   . Heart attack Mother   . Stroke Mother   . Colon cancer Father   . Dementia Father   . Heart attack Father   . Arthritis Paternal Grandmother     History   Social History  . Marital Status: Married    Spouse Name: N/A    Number of Children: N/A  . Years of Education: N/A   Social History Main Topics  . Smoking status: Never Smoker   . Smokeless tobacco: Never Used  . Alcohol Use: No  . Drug Use: No  . Sexual Activity: Yes   Other Topics Concern  . None   Social History Narrative   Lives with his wife and uses a wheelchair for transfers.     Review of Systems -  See HPI.  All other ROS are negative.  Filed Vitals:   03/07/13 1626  BP: 102/78  Pulse: 87  Temp: 98.3 F (36.8 C)  Resp: 18   Physical Exam  Constitutional: He is oriented to person, place, and time.  Extremely obese, pleasant caucasian gentleman in no acute distress.  Patient is wheelchair bound.  Patient is a poor historian.  HENT:  Head: Normocephalic.  Eyes: Conjunctivae and EOM are normal. Pupils are equal, round, and reactive to light.  Neck: Neck supple.  Cardiovascular: Normal rate, regular rhythm, normal heart sounds and intact distal pulses.   Pulmonary/Chest: Effort normal and breath sounds normal. No respiratory distress. He has no wheezes. He has no rales. He exhibits no tenderness.  Abdominal: Soft. Bowel sounds are normal. He exhibits no distension and no mass. There is no tenderness. There is no rebound and no guarding.  Genitourinary:  Suprapubic catheter in place with good drainage.  Musculoskeletal:  Patient with BKA of left knee.  Lymphadenopathy:    He has no cervical adenopathy.  Neurological: He is alert and oriented to person, place, and time. No cranial nerve deficit. GCS score is 15.  Skin: Skin is warm and dry. No rash noted.  Psychiatric: Affect normal.    Recent Results (from the past 2160 hour(s))  PROTIME-INR     Status: Abnormal   Collection Time    01/03/13  3:36 PM      Result Value Range   Prothrombin Time 26.7 (*) 11.6 - 15.2 seconds   INR 2.59 (*) <1.50   Comment: The INR is of principal utility in following patients on stable doses     of oral anticoagulants.  The therapeutic range is generally 2.0 to     3.0, but may be 3.0 to 4.0 in patients with mechanical cardiac valves,     recurrent embolisms and antiphospholipid antibodies (including lupus     inhibitors).  CBC WITH DIFFERENTIAL     Status: Abnormal   Collection Time    03/07/13  5:19 PM      Result Value Range   WBC 5.4  4.0 - 10.5 K/uL   Comment: Age of specimen may  affect integrity of results.   RBC 3.00 (*) 4.22 - 5.81 MIL/uL   Hemoglobin 8.4 (*) 13.0 - 17.0 g/dL   HCT 27.1 (*) 39.0 - 52.0 %   MCV 90.3  78.0 - 100.0 fL   MCH 28.0  26.0 - 34.0 pg   MCHC 31.0  30.0 - 36.0 g/dL   RDW 17.4 (*) 11.5 - 15.5 %   Platelets 209  150 - 400 K/uL   Neutrophils Relative % 72  43 - 77 %   Neutro Abs 4.2  1.7 - 7.7 K/uL   Lymphocytes Relative 13  12 - 46 %   Lymphs Abs 0.7  0.7 - 4.0 K/uL   Monocytes Relative 7  3 - 12 %   Monocytes Absolute 0.4  0.1 - 1.0 K/uL   Eosinophils Relative 2  0 - 5 %   Eosinophils Absolute 0.1  0.0 - 0.7 K/uL   Basophils Relative 1  0 - 1 %   Basophils Absolute 0.0  0.0 - 0.1 K/uL   Smear Review       Comment: Vacuolated neutrophils  BASIC METABOLIC PANEL WITH GFR     Status: Abnormal   Collection Time    03/07/13  5:19 PM      Result Value Range   Sodium 137  135 - 145 mEq/L   Potassium 4.2  3.5 - 5.3 mEq/L   Chloride 105  96 - 112 mEq/L   CO2 20  19 - 32 mEq/L   Glucose, Bld 127 (*) 70 - 99 mg/dL   BUN 36 (*) 6 - 23 mg/dL   Creat 2.98 (*) 0.50 - 1.35 mg/dL   Calcium 8.0 (*) 8.4 - 10.5 mg/dL   GFR, Est African American 26 (*)    GFR, Est Non African American 23 (*)    Comment:       The estimated GFR is a calculation valid for adults (>=43 years old)     that uses the CKD-EPI algorithm to adjust for age and sex. It is       not to be used for children, pregnant women, hospitalized patients,        patients on dialysis, or with rapidly changing kidney function.     According to the NKDEP, eGFR >89 is normal, 60-89 shows mild     impairment, 30-59 shows moderate impairment, 15-29 shows severe     impairment and <15 is ESRD.        PROTIME-INR     Status: Abnormal   Collection Time    03/07/13  5:19 PM      Result Value Range   Prothrombin Time 20.3 (*) 11.6 - 15.2 seconds   INR 1.78 (*) <1.50   Comment: The INR is of principal utility in following patients on stable doses     of oral anticoagulants.  The  therapeutic range is generally 2.0 to     3.0, but may be 3.0 to 4.0 in patients with mechanical cardiac valves,     recurrent embolisms and antiphospholipid antibodies (including lupus     inhibitors).  URINALYSIS, ROUTINE W REFLEX MICROSCOPIC     Status: Abnormal   Collection Time    03/07/13  5:19 PM      Result Value Range   Color, Urine YELLOW  YELLOW   APPearance TURBID (*) CLEAR   Specific Gravity, Urine 1.013  1.005 - 1.030   pH 6.0  5.0 - 8.0   Glucose, UA NEG  NEG mg/dL   Bilirubin Urine NEG  NEG   Ketones, ur NEG  NEG mg/dL   Hgb urine dipstick LARGE (*) NEG   Protein, ur 100 (*) NEG mg/dL   Urobilinogen, UA 0.2  0.0 - 1.0 mg/dL   Nitrite NEG  NEG   Leukocytes, UA LARGE (*)  NEG  URINALYSIS, MICROSCOPIC ONLY     Status: Abnormal   Collection Time    03/07/13  5:19 PM      Result Value Range   Squamous Epithelial / LPF NONE SEEN  RARE   Crystals NONE SEEN  NONE SEEN   Casts NONE SEEN  NONE SEEN   WBC, UA >50 (*) <3 WBC/hpf   RBC / HPF >50 (*) <3 RBC/hpf   Bacteria, UA NONE SEEN  RARE  URINE CULTURE     Status: None   Collection Time    03/07/13  5:21 PM      Result Value Range   Colony Count 9,000 COLONIES/ML     Organism ID, Bacteria Insignificant Growth      Assessment/Plan: No problem-specific assessment & plan notes found for this encounter.

## 2013-03-13 NOTE — Telephone Encounter (Signed)
LMOM with contact name and number for return call RE: reiterated the Importance of returning our call so that we may get him set up to have Lovenox bridge to start the following day after stopping Coumadin before surgery; again, reiterated that this Must be done prior to having surgery, and further provider instructions Also need to go over results copied below:  Notes Recorded by Rockwell [place], [place] on 03/12/2013 at 9:50 AM [facility] with contact name and number for return call RE: Results and further provider instructions/SLS  Notes Recorded by Leeanne Rio, PA-C on 03/12/2013 at 9:34 AM Urine culture back -- no significant growth. He needs to be seen by his Urologist this week as he is overdue for follow-up. If he needs help setting this up, we can help facilitate the appointment. Notes Recorded by Leeanne Rio, PA-C on 03/12/2013 at 7:51 AM Please inform patient that we are still waiting on the results of his urine culture. His kidney function seems somewhat improved since last visit, most likely due to catheter re-insertion. Patient's INR is just slightly sub-therapeutic. I would like for him to take 1 1/2 tablets of his coumadin (3mg ) total for one day, then return to taking 1 tablet (2 mg) daily. We will need to recheck his PT/INR. Check to see that his has followed up with his urologist. It is very important that he do so. Also, see when he is due to see his Nephrologist for B12 injections. I will call him as soon as the rest of his labs are in.

## 2013-03-14 ENCOUNTER — Encounter: Payer: Self-pay | Admitting: *Deleted

## 2013-03-14 ENCOUNTER — Other Ambulatory Visit: Payer: Self-pay | Admitting: Physician Assistant

## 2013-03-14 DIAGNOSIS — Z435 Encounter for attention to cystostomy: Secondary | ICD-10-CM | POA: Diagnosis not present

## 2013-03-14 DIAGNOSIS — I872 Venous insufficiency (chronic) (peripheral): Secondary | ICD-10-CM | POA: Diagnosis not present

## 2013-03-14 DIAGNOSIS — M6281 Muscle weakness (generalized): Secondary | ICD-10-CM | POA: Diagnosis not present

## 2013-03-14 DIAGNOSIS — E119 Type 2 diabetes mellitus without complications: Secondary | ICD-10-CM | POA: Diagnosis not present

## 2013-03-14 DIAGNOSIS — L97209 Non-pressure chronic ulcer of unspecified calf with unspecified severity: Secondary | ICD-10-CM | POA: Diagnosis not present

## 2013-03-14 DIAGNOSIS — N200 Calculus of kidney: Secondary | ICD-10-CM | POA: Diagnosis not present

## 2013-03-14 DIAGNOSIS — Z7901 Long term (current) use of anticoagulants: Secondary | ICD-10-CM

## 2013-03-14 MED ORDER — ENOXAPARIN SODIUM 150 MG/ML ~~LOC~~ SOLN: 1.5000 mg/kg | SUBCUTANEOUS | Status: DC

## 2013-03-14 NOTE — Telephone Encounter (Signed)
Patient LMOM, stating that he knows to stop Coumadin 5 days prior to surgery, but wants to do the Lovenox injections himself, instead of going to a Coumadin Clinic as instructed; per Provider, this is NOT an option for patient/SLS

## 2013-03-14 NOTE — Telephone Encounter (Signed)
Spoke with patient RE: Lovenox injections and provider response , and he is unyielding about knowing "how & where to give himself the injections at home, how he has done it so many times before and that he does not have the transportation" to get to the Coumadin Clinic for these injections/SLS Do you want to try and speak with him and/or try Home Health as an option with him.? Please Advise.

## 2013-03-14 NOTE — Telephone Encounter (Signed)
We can set him up for [facility] for this.  We will need to get this set up fast if he is willing. If he is not willing to follow instructions and be compliant, he will have to be fired from our practices.  We want to take the best care of him possible but he is constantly noncompliant.

## 2013-03-17 ENCOUNTER — Other Ambulatory Visit: Payer: Self-pay | Admitting: Physician Assistant

## 2013-03-17 DIAGNOSIS — N200 Calculus of kidney: Secondary | ICD-10-CM | POA: Diagnosis not present

## 2013-03-17 DIAGNOSIS — L97209 Non-pressure chronic ulcer of unspecified calf with unspecified severity: Secondary | ICD-10-CM | POA: Diagnosis not present

## 2013-03-17 DIAGNOSIS — E119 Type 2 diabetes mellitus without complications: Secondary | ICD-10-CM | POA: Diagnosis not present

## 2013-03-17 DIAGNOSIS — Z7901 Long term (current) use of anticoagulants: Secondary | ICD-10-CM

## 2013-03-17 DIAGNOSIS — M6281 Muscle weakness (generalized): Secondary | ICD-10-CM | POA: Diagnosis not present

## 2013-03-17 DIAGNOSIS — Z435 Encounter for attention to cystostomy: Secondary | ICD-10-CM | POA: Diagnosis not present

## 2013-03-17 DIAGNOSIS — I872 Venous insufficiency (chronic) (peripheral): Secondary | ICD-10-CM | POA: Diagnosis not present

## 2013-03-17 MED ORDER — ENOXAPARIN SODIUM 150 MG/ML ~~LOC~~ SOLN: 1.5000 mg/kg | SUBCUTANEOUS | Status: DC

## 2013-03-17 NOTE — Telephone Encounter (Signed)
Patient informed that provider will allow Home Health to administer Lovenox injections and that the Physician Orders were faxed to [facility] on Fri, 01.16.15. Patient agreed, states that he has not yet heard from home health; informed will contact for status of Skilled Nursing to begin Lovenox injections today prior to scheduled 01.23.15 Surgery date/SLS

## 2013-03-17 NOTE — Telephone Encounter (Addendum)
Phoned [facility]: physician order for Skilled Nursing to administer Lovenox injections [faxed on Fri, 01.16.15] on Monday, Tuesday & Wednesday, 01.19.15-01.21.15 prior to scheduled Surgery on Fri, 01.23.15; transferred to Pharmacy where I am transferred once again to Collie Siad, who informs me that she will need to call me back after she checks status of patient's appointment being scheduled today. Received return call from [place] at [facility] who informs me that [name] Apple will be patient's Primary Nurse and gave me her Mobile number [phone] to contact RE: pt's Lovenox injections. Spoke wit [employer], nurse, and she informs me that if I can get pt to call [facility] and pay for Rx via phone that she will pick-up the px and be at pt's home between 2:30-3:00p today for first injection. LMOM for patient with this information/SLS

## 2013-03-17 NOTE — Telephone Encounter (Signed)
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 Per pt request, phoned pharmacy and let them know of pt's situation and that he would be calling soon with payment information for the Lovenox, so that his Nurse w/[facility] may p/u medication for home injections; pharmacy understood & agreed/SLS

## 2013-03-18 ENCOUNTER — Other Ambulatory Visit: Payer: Self-pay | Admitting: Physician Assistant

## 2013-03-18 DIAGNOSIS — I872 Venous insufficiency (chronic) (peripheral): Secondary | ICD-10-CM | POA: Diagnosis not present

## 2013-03-18 DIAGNOSIS — Z7901 Long term (current) use of anticoagulants: Secondary | ICD-10-CM

## 2013-03-18 DIAGNOSIS — L97209 Non-pressure chronic ulcer of unspecified calf with unspecified severity: Secondary | ICD-10-CM | POA: Diagnosis not present

## 2013-03-18 DIAGNOSIS — N200 Calculus of kidney: Secondary | ICD-10-CM | POA: Diagnosis not present

## 2013-03-18 DIAGNOSIS — E119 Type 2 diabetes mellitus without complications: Secondary | ICD-10-CM | POA: Diagnosis not present

## 2013-03-18 DIAGNOSIS — Z435 Encounter for attention to cystostomy: Secondary | ICD-10-CM | POA: Diagnosis not present

## 2013-03-18 DIAGNOSIS — M6281 Muscle weakness (generalized): Secondary | ICD-10-CM | POA: Diagnosis not present

## 2013-03-18 MED ORDER — ENOXAPARIN SODIUM 150 MG/ML ~~LOC~~ SOLN: 1.5000 mg/kg | SUBCUTANEOUS | Status: DC

## 2013-03-18 NOTE — Telephone Encounter (Signed)
Received refill request from [facility] stating pt needed 4 more syringes to complete Lovenox therapy through Thursday.  Per 03/10/13 phone note, pt was to complete lovenox 1 daily on Monday, Tuesday and Wednesday. No further injections should be needed at this time as pharmacy states pt already picked up 3 syringes. Attempted to reach Lorrin Mais, [place] nurse at [phone] for further clarification and left message to call me back.

## 2013-03-18 NOTE — Telephone Encounter (Signed)
Received call back from [facility] with [place]. She states she picked up 3 previous syringes that 1 mL each and rx was for 1.57mL. She states they are going to need 3 additional syringes sent to the pharmacy to complete the regimen previously prescribed.  Please advise how additional rx should be written?

## 2013-03-18 NOTE — Telephone Encounter (Signed)
OK to send 3 additional syringes.

## 2013-03-18 NOTE — Telephone Encounter (Signed)
Rx sent. Notified [name] with [facility].

## 2013-03-19 DIAGNOSIS — N200 Calculus of kidney: Secondary | ICD-10-CM | POA: Diagnosis not present

## 2013-03-19 DIAGNOSIS — E119 Type 2 diabetes mellitus without complications: Secondary | ICD-10-CM | POA: Diagnosis not present

## 2013-03-19 DIAGNOSIS — I872 Venous insufficiency (chronic) (peripheral): Secondary | ICD-10-CM | POA: Diagnosis not present

## 2013-03-19 DIAGNOSIS — L97209 Non-pressure chronic ulcer of unspecified calf with unspecified severity: Secondary | ICD-10-CM | POA: Diagnosis not present

## 2013-03-19 DIAGNOSIS — M6281 Muscle weakness (generalized): Secondary | ICD-10-CM | POA: Diagnosis not present

## 2013-03-19 DIAGNOSIS — Z435 Encounter for attention to cystostomy: Secondary | ICD-10-CM | POA: Diagnosis not present

## 2013-03-20 ENCOUNTER — Telehealth: Payer: Self-pay | Admitting: *Deleted

## 2013-03-20 DIAGNOSIS — I872 Venous insufficiency (chronic) (peripheral): Secondary | ICD-10-CM | POA: Diagnosis not present

## 2013-03-20 DIAGNOSIS — Z435 Encounter for attention to cystostomy: Secondary | ICD-10-CM | POA: Diagnosis not present

## 2013-03-20 DIAGNOSIS — E119 Type 2 diabetes mellitus without complications: Secondary | ICD-10-CM | POA: Diagnosis not present

## 2013-03-20 DIAGNOSIS — M6281 Muscle weakness (generalized): Secondary | ICD-10-CM | POA: Diagnosis not present

## 2013-03-20 DIAGNOSIS — N200 Calculus of kidney: Secondary | ICD-10-CM | POA: Diagnosis not present

## 2013-03-20 DIAGNOSIS — L97209 Non-pressure chronic ulcer of unspecified calf with unspecified severity: Secondary | ICD-10-CM | POA: Diagnosis not present

## 2013-03-20 NOTE — Telephone Encounter (Signed)
Nurse denies s/s of infection. Patient afebrile. Nurse instructed to clean and redress the area and encourage patient to return to clinic if symptoms worsen.

## 2013-03-20 NOTE — Telephone Encounter (Signed)
[name] from [facility] called with PT/INR results : PT-23.9 INR-2.0   [name] also states that patient had a compression on R lower leg that wife wrapped. She undressed it and seen that it had open wounds with drainage.

## 2013-03-21 DIAGNOSIS — Z993 Dependence on wheelchair: Secondary | ICD-10-CM | POA: Diagnosis not present

## 2013-03-21 DIAGNOSIS — N200 Calculus of kidney: Secondary | ICD-10-CM | POA: Diagnosis not present

## 2013-03-21 DIAGNOSIS — Z96659 Presence of unspecified artificial knee joint: Secondary | ICD-10-CM | POA: Diagnosis not present

## 2013-03-21 DIAGNOSIS — Z86718 Personal history of other venous thrombosis and embolism: Secondary | ICD-10-CM | POA: Diagnosis not present

## 2013-03-21 DIAGNOSIS — Z86711 Personal history of pulmonary embolism: Secondary | ICD-10-CM | POA: Diagnosis not present

## 2013-03-21 DIAGNOSIS — Z7901 Long term (current) use of anticoagulants: Secondary | ICD-10-CM | POA: Diagnosis not present

## 2013-03-21 DIAGNOSIS — N390 Urinary tract infection, site not specified: Secondary | ICD-10-CM | POA: Diagnosis not present

## 2013-03-21 DIAGNOSIS — I872 Venous insufficiency (chronic) (peripheral): Secondary | ICD-10-CM | POA: Diagnosis present

## 2013-03-21 DIAGNOSIS — L97909 Non-pressure chronic ulcer of unspecified part of unspecified lower leg with unspecified severity: Secondary | ICD-10-CM | POA: Diagnosis not present

## 2013-03-21 DIAGNOSIS — R6520 Severe sepsis without septic shock: Secondary | ICD-10-CM | POA: Diagnosis not present

## 2013-03-21 DIAGNOSIS — Z794 Long term (current) use of insulin: Secondary | ICD-10-CM | POA: Diagnosis not present

## 2013-03-21 DIAGNOSIS — N1330 Unspecified hydronephrosis: Secondary | ICD-10-CM | POA: Diagnosis not present

## 2013-03-21 DIAGNOSIS — A419 Sepsis, unspecified organism: Secondary | ICD-10-CM | POA: Diagnosis not present

## 2013-03-21 DIAGNOSIS — Z466 Encounter for fitting and adjustment of urinary device: Secondary | ICD-10-CM | POA: Diagnosis not present

## 2013-03-21 DIAGNOSIS — E119 Type 2 diabetes mellitus without complications: Secondary | ICD-10-CM | POA: Diagnosis not present

## 2013-03-21 DIAGNOSIS — A4150 Gram-negative sepsis, unspecified: Secondary | ICD-10-CM | POA: Diagnosis not present

## 2013-03-21 DIAGNOSIS — S88119A Complete traumatic amputation at level between knee and ankle, unspecified lower leg, initial encounter: Secondary | ICD-10-CM | POA: Diagnosis not present

## 2013-03-21 DIAGNOSIS — I10 Essential (primary) hypertension: Secondary | ICD-10-CM | POA: Diagnosis not present

## 2013-03-21 DIAGNOSIS — Z96649 Presence of unspecified artificial hip joint: Secondary | ICD-10-CM | POA: Diagnosis not present

## 2013-03-25 DIAGNOSIS — E119 Type 2 diabetes mellitus without complications: Secondary | ICD-10-CM | POA: Diagnosis not present

## 2013-03-25 DIAGNOSIS — N200 Calculus of kidney: Secondary | ICD-10-CM | POA: Diagnosis not present

## 2013-03-25 DIAGNOSIS — Z435 Encounter for attention to cystostomy: Secondary | ICD-10-CM | POA: Diagnosis not present

## 2013-03-25 DIAGNOSIS — I872 Venous insufficiency (chronic) (peripheral): Secondary | ICD-10-CM | POA: Diagnosis not present

## 2013-03-25 DIAGNOSIS — M6281 Muscle weakness (generalized): Secondary | ICD-10-CM | POA: Diagnosis not present

## 2013-03-25 DIAGNOSIS — L97209 Non-pressure chronic ulcer of unspecified calf with unspecified severity: Secondary | ICD-10-CM | POA: Diagnosis not present

## 2013-03-26 ENCOUNTER — Telehealth: Payer: Self-pay | Admitting: *Deleted

## 2013-03-26 DIAGNOSIS — N200 Calculus of kidney: Secondary | ICD-10-CM | POA: Diagnosis not present

## 2013-03-26 DIAGNOSIS — M6281 Muscle weakness (generalized): Secondary | ICD-10-CM | POA: Diagnosis not present

## 2013-03-26 DIAGNOSIS — Z435 Encounter for attention to cystostomy: Secondary | ICD-10-CM | POA: Diagnosis not present

## 2013-03-26 DIAGNOSIS — E119 Type 2 diabetes mellitus without complications: Secondary | ICD-10-CM | POA: Diagnosis not present

## 2013-03-26 DIAGNOSIS — L97209 Non-pressure chronic ulcer of unspecified calf with unspecified severity: Secondary | ICD-10-CM | POA: Diagnosis not present

## 2013-03-26 DIAGNOSIS — I872 Venous insufficiency (chronic) (peripheral): Secondary | ICD-10-CM | POA: Diagnosis not present

## 2013-03-26 NOTE — Telephone Encounter (Signed)
LMOM with contact name and number for return call RE: scheduled surgery with [facility] on 01.23.15 [if was able to have this time, how he's doing] and to scheduled Hospital F/U appt with Elyn Aquas, PA-C/SLS

## 2013-03-28 ENCOUNTER — Telehealth: Payer: Self-pay | Admitting: Physician Assistant

## 2013-03-28 DIAGNOSIS — M6281 Muscle weakness (generalized): Secondary | ICD-10-CM | POA: Diagnosis not present

## 2013-03-28 DIAGNOSIS — Z435 Encounter for attention to cystostomy: Secondary | ICD-10-CM | POA: Diagnosis not present

## 2013-03-28 DIAGNOSIS — N200 Calculus of kidney: Secondary | ICD-10-CM | POA: Diagnosis not present

## 2013-03-28 DIAGNOSIS — L97209 Non-pressure chronic ulcer of unspecified calf with unspecified severity: Secondary | ICD-10-CM | POA: Diagnosis not present

## 2013-03-28 DIAGNOSIS — E119 Type 2 diabetes mellitus without complications: Secondary | ICD-10-CM | POA: Diagnosis not present

## 2013-03-28 DIAGNOSIS — I872 Venous insufficiency (chronic) (peripheral): Secondary | ICD-10-CM | POA: Diagnosis not present

## 2013-03-28 NOTE — Telephone Encounter (Signed)
Provider has spoken with [name] at [facility] about this matter/SLS

## 2013-03-28 NOTE — Telephone Encounter (Signed)
[facility] is in the home, patient has a womb on this stomp. Requesting womb care orders. RN is at the home now.

## 2013-03-29 ENCOUNTER — Other Ambulatory Visit: Payer: Self-pay | Admitting: Physician Assistant

## 2013-03-31 NOTE — Telephone Encounter (Signed)
Received refill request from pharmacy for levothyroxine 218mcg. Current med list states 73mcg.  Left message for pt to return my call.  Need to clarify what dose pt is actually taking?

## 2013-03-31 NOTE — Telephone Encounter (Signed)
LMOM with contact name and number [for return call, if needed] RE: Levothyroxine dosage; informed pt should be taking 248mcg + 38mcg [225 mcg total] daily [do not know how the 200 mcg dosage got removed from medication list between 11.2014  01.2015 appts] per provider instructions/SLS Rx request to pharmacy/SLS

## 2013-04-01 ENCOUNTER — Telehealth: Payer: Self-pay | Admitting: Physician Assistant

## 2013-04-01 DIAGNOSIS — Z435 Encounter for attention to cystostomy: Secondary | ICD-10-CM | POA: Diagnosis not present

## 2013-04-01 DIAGNOSIS — E119 Type 2 diabetes mellitus without complications: Secondary | ICD-10-CM | POA: Diagnosis not present

## 2013-04-01 DIAGNOSIS — N200 Calculus of kidney: Secondary | ICD-10-CM | POA: Diagnosis not present

## 2013-04-01 DIAGNOSIS — M6281 Muscle weakness (generalized): Secondary | ICD-10-CM | POA: Diagnosis not present

## 2013-04-01 DIAGNOSIS — L97209 Non-pressure chronic ulcer of unspecified calf with unspecified severity: Secondary | ICD-10-CM | POA: Diagnosis not present

## 2013-04-01 DIAGNOSIS — I872 Venous insufficiency (chronic) (peripheral): Secondary | ICD-10-CM | POA: Diagnosis not present

## 2013-04-01 MED ORDER — GLUCOSE BLOOD VI STRP: ORAL_STRIP | Status: DC

## 2013-04-01 NOTE — Telephone Encounter (Signed)
Hs been getting his test strips from [employer].  They are telling him his meter is obselet and want him to change to a different meter and strips He wants to keep the meter he has a contour and he would like you to send an rx for his strips to [facility]

## 2013-04-01 NOTE — Telephone Encounter (Signed)
Rx sent.  Left message on home # to return my call.

## 2013-04-02 DIAGNOSIS — Z435 Encounter for attention to cystostomy: Secondary | ICD-10-CM | POA: Diagnosis not present

## 2013-04-02 DIAGNOSIS — E119 Type 2 diabetes mellitus without complications: Secondary | ICD-10-CM | POA: Diagnosis not present

## 2013-04-02 DIAGNOSIS — I872 Venous insufficiency (chronic) (peripheral): Secondary | ICD-10-CM | POA: Diagnosis not present

## 2013-04-02 DIAGNOSIS — M6281 Muscle weakness (generalized): Secondary | ICD-10-CM | POA: Diagnosis not present

## 2013-04-02 DIAGNOSIS — L97209 Non-pressure chronic ulcer of unspecified calf with unspecified severity: Secondary | ICD-10-CM | POA: Diagnosis not present

## 2013-04-02 DIAGNOSIS — N200 Calculus of kidney: Secondary | ICD-10-CM | POA: Diagnosis not present

## 2013-04-04 DIAGNOSIS — E119 Type 2 diabetes mellitus without complications: Secondary | ICD-10-CM | POA: Diagnosis not present

## 2013-04-04 DIAGNOSIS — M6281 Muscle weakness (generalized): Secondary | ICD-10-CM | POA: Diagnosis not present

## 2013-04-04 DIAGNOSIS — L97209 Non-pressure chronic ulcer of unspecified calf with unspecified severity: Secondary | ICD-10-CM | POA: Diagnosis not present

## 2013-04-04 DIAGNOSIS — I872 Venous insufficiency (chronic) (peripheral): Secondary | ICD-10-CM | POA: Diagnosis not present

## 2013-04-04 DIAGNOSIS — N200 Calculus of kidney: Secondary | ICD-10-CM | POA: Diagnosis not present

## 2013-04-04 DIAGNOSIS — Z435 Encounter for attention to cystostomy: Secondary | ICD-10-CM | POA: Diagnosis not present

## 2013-04-08 DIAGNOSIS — I872 Venous insufficiency (chronic) (peripheral): Secondary | ICD-10-CM | POA: Diagnosis not present

## 2013-04-08 DIAGNOSIS — Z435 Encounter for attention to cystostomy: Secondary | ICD-10-CM | POA: Diagnosis not present

## 2013-04-08 DIAGNOSIS — L97209 Non-pressure chronic ulcer of unspecified calf with unspecified severity: Secondary | ICD-10-CM | POA: Diagnosis not present

## 2013-04-08 DIAGNOSIS — N200 Calculus of kidney: Secondary | ICD-10-CM | POA: Diagnosis not present

## 2013-04-08 DIAGNOSIS — E119 Type 2 diabetes mellitus without complications: Secondary | ICD-10-CM | POA: Diagnosis not present

## 2013-04-08 DIAGNOSIS — M6281 Muscle weakness (generalized): Secondary | ICD-10-CM | POA: Diagnosis not present

## 2013-04-11 DIAGNOSIS — N200 Calculus of kidney: Secondary | ICD-10-CM | POA: Diagnosis not present

## 2013-04-11 DIAGNOSIS — I872 Venous insufficiency (chronic) (peripheral): Secondary | ICD-10-CM | POA: Diagnosis not present

## 2013-04-11 DIAGNOSIS — Z435 Encounter for attention to cystostomy: Secondary | ICD-10-CM | POA: Diagnosis not present

## 2013-04-11 DIAGNOSIS — L97209 Non-pressure chronic ulcer of unspecified calf with unspecified severity: Secondary | ICD-10-CM | POA: Diagnosis not present

## 2013-04-11 DIAGNOSIS — E119 Type 2 diabetes mellitus without complications: Secondary | ICD-10-CM | POA: Diagnosis not present

## 2013-04-11 DIAGNOSIS — M6281 Muscle weakness (generalized): Secondary | ICD-10-CM | POA: Diagnosis not present

## 2013-04-14 ENCOUNTER — Telehealth: Payer: Self-pay | Admitting: Physician Assistant

## 2013-04-14 DIAGNOSIS — E669 Obesity, unspecified: Secondary | ICD-10-CM | POA: Diagnosis not present

## 2013-04-14 DIAGNOSIS — T83091A Other mechanical complication of indwelling urethral catheter, initial encounter: Secondary | ICD-10-CM | POA: Diagnosis not present

## 2013-04-14 DIAGNOSIS — M259 Joint disorder, unspecified: Secondary | ICD-10-CM | POA: Diagnosis not present

## 2013-04-14 DIAGNOSIS — Y846 Urinary catheterization as the cause of abnormal reaction of the patient, or of later complication, without mention of misadventure at the time of the procedure: Secondary | ICD-10-CM | POA: Diagnosis not present

## 2013-04-14 DIAGNOSIS — T8389XA Other specified complication of genitourinary prosthetic devices, implants and grafts, initial encounter: Secondary | ICD-10-CM | POA: Diagnosis not present

## 2013-04-14 DIAGNOSIS — Z466 Encounter for fitting and adjustment of urinary device: Secondary | ICD-10-CM | POA: Diagnosis not present

## 2013-04-14 NOTE — Telephone Encounter (Signed)
Chris from [facility] called stating that he needs an order to continue therapy for patient.

## 2013-04-14 NOTE — Telephone Encounter (Signed)
LMOM with contact name and number for return call RE: Request for Orders, if Ok to accept VO/SLS Spoke with Gerald Stabs w/[facility] and gave VO for continuation of therapy/SLS  [facility] with contact name and number for return call for patient RE: Needed appt for hospital f/u per provider instructions/SLS

## 2013-04-15 DIAGNOSIS — E119 Type 2 diabetes mellitus without complications: Secondary | ICD-10-CM | POA: Diagnosis not present

## 2013-04-15 DIAGNOSIS — Z466 Encounter for fitting and adjustment of urinary device: Secondary | ICD-10-CM | POA: Diagnosis not present

## 2013-04-15 DIAGNOSIS — T82898A Other specified complication of vascular prosthetic devices, implants and grafts, initial encounter: Secondary | ICD-10-CM | POA: Diagnosis not present

## 2013-04-15 DIAGNOSIS — N390 Urinary tract infection, site not specified: Secondary | ICD-10-CM | POA: Diagnosis not present

## 2013-04-15 DIAGNOSIS — M6281 Muscle weakness (generalized): Secondary | ICD-10-CM | POA: Diagnosis not present

## 2013-04-15 DIAGNOSIS — T8389XA Other specified complication of genitourinary prosthetic devices, implants and grafts, initial encounter: Secondary | ICD-10-CM | POA: Diagnosis not present

## 2013-04-15 DIAGNOSIS — I872 Venous insufficiency (chronic) (peripheral): Secondary | ICD-10-CM | POA: Diagnosis not present

## 2013-04-15 DIAGNOSIS — T83091A Other mechanical complication of indwelling urethral catheter, initial encounter: Secondary | ICD-10-CM | POA: Diagnosis not present

## 2013-04-15 DIAGNOSIS — L97209 Non-pressure chronic ulcer of unspecified calf with unspecified severity: Secondary | ICD-10-CM | POA: Diagnosis not present

## 2013-04-15 DIAGNOSIS — Y846 Urinary catheterization as the cause of abnormal reaction of the patient, or of later complication, without mention of misadventure at the time of the procedure: Secondary | ICD-10-CM | POA: Diagnosis not present

## 2013-04-15 DIAGNOSIS — N200 Calculus of kidney: Secondary | ICD-10-CM | POA: Diagnosis not present

## 2013-04-15 DIAGNOSIS — Z435 Encounter for attention to cystostomy: Secondary | ICD-10-CM | POA: Diagnosis not present

## 2013-04-16 DIAGNOSIS — I872 Venous insufficiency (chronic) (peripheral): Secondary | ICD-10-CM | POA: Diagnosis not present

## 2013-04-16 DIAGNOSIS — Z435 Encounter for attention to cystostomy: Secondary | ICD-10-CM | POA: Diagnosis not present

## 2013-04-16 DIAGNOSIS — M6281 Muscle weakness (generalized): Secondary | ICD-10-CM | POA: Diagnosis not present

## 2013-04-16 DIAGNOSIS — E119 Type 2 diabetes mellitus without complications: Secondary | ICD-10-CM | POA: Diagnosis not present

## 2013-04-16 DIAGNOSIS — N200 Calculus of kidney: Secondary | ICD-10-CM | POA: Diagnosis not present

## 2013-04-16 DIAGNOSIS — L97209 Non-pressure chronic ulcer of unspecified calf with unspecified severity: Secondary | ICD-10-CM | POA: Diagnosis not present

## 2013-04-18 DIAGNOSIS — E119 Type 2 diabetes mellitus without complications: Secondary | ICD-10-CM | POA: Diagnosis not present

## 2013-04-18 DIAGNOSIS — L97209 Non-pressure chronic ulcer of unspecified calf with unspecified severity: Secondary | ICD-10-CM | POA: Diagnosis not present

## 2013-04-18 DIAGNOSIS — N200 Calculus of kidney: Secondary | ICD-10-CM | POA: Diagnosis not present

## 2013-04-18 DIAGNOSIS — I872 Venous insufficiency (chronic) (peripheral): Secondary | ICD-10-CM | POA: Diagnosis not present

## 2013-04-18 DIAGNOSIS — Z435 Encounter for attention to cystostomy: Secondary | ICD-10-CM | POA: Diagnosis not present

## 2013-04-18 DIAGNOSIS — M6281 Muscle weakness (generalized): Secondary | ICD-10-CM | POA: Diagnosis not present

## 2013-04-19 DIAGNOSIS — N200 Calculus of kidney: Secondary | ICD-10-CM | POA: Diagnosis not present

## 2013-04-19 DIAGNOSIS — M6281 Muscle weakness (generalized): Secondary | ICD-10-CM | POA: Diagnosis not present

## 2013-04-19 DIAGNOSIS — Z435 Encounter for attention to cystostomy: Secondary | ICD-10-CM | POA: Diagnosis not present

## 2013-04-19 DIAGNOSIS — I872 Venous insufficiency (chronic) (peripheral): Secondary | ICD-10-CM | POA: Diagnosis not present

## 2013-04-19 DIAGNOSIS — L97209 Non-pressure chronic ulcer of unspecified calf with unspecified severity: Secondary | ICD-10-CM | POA: Diagnosis not present

## 2013-04-19 DIAGNOSIS — E119 Type 2 diabetes mellitus without complications: Secondary | ICD-10-CM | POA: Diagnosis not present

## 2013-04-21 DIAGNOSIS — E119 Type 2 diabetes mellitus without complications: Secondary | ICD-10-CM | POA: Diagnosis not present

## 2013-04-21 DIAGNOSIS — I872 Venous insufficiency (chronic) (peripheral): Secondary | ICD-10-CM | POA: Diagnosis not present

## 2013-04-21 DIAGNOSIS — M6281 Muscle weakness (generalized): Secondary | ICD-10-CM | POA: Diagnosis not present

## 2013-04-21 DIAGNOSIS — N200 Calculus of kidney: Secondary | ICD-10-CM | POA: Diagnosis not present

## 2013-04-21 DIAGNOSIS — L97209 Non-pressure chronic ulcer of unspecified calf with unspecified severity: Secondary | ICD-10-CM | POA: Diagnosis not present

## 2013-04-21 DIAGNOSIS — Z435 Encounter for attention to cystostomy: Secondary | ICD-10-CM | POA: Diagnosis not present

## 2013-04-22 DIAGNOSIS — N200 Calculus of kidney: Secondary | ICD-10-CM | POA: Diagnosis not present

## 2013-04-22 DIAGNOSIS — E119 Type 2 diabetes mellitus without complications: Secondary | ICD-10-CM | POA: Diagnosis not present

## 2013-04-22 DIAGNOSIS — Z435 Encounter for attention to cystostomy: Secondary | ICD-10-CM | POA: Diagnosis not present

## 2013-04-22 DIAGNOSIS — I872 Venous insufficiency (chronic) (peripheral): Secondary | ICD-10-CM | POA: Diagnosis not present

## 2013-04-22 DIAGNOSIS — L97209 Non-pressure chronic ulcer of unspecified calf with unspecified severity: Secondary | ICD-10-CM | POA: Diagnosis not present

## 2013-04-22 DIAGNOSIS — M6281 Muscle weakness (generalized): Secondary | ICD-10-CM | POA: Diagnosis not present

## 2013-04-24 DIAGNOSIS — N200 Calculus of kidney: Secondary | ICD-10-CM | POA: Diagnosis not present

## 2013-04-24 DIAGNOSIS — I872 Venous insufficiency (chronic) (peripheral): Secondary | ICD-10-CM | POA: Diagnosis not present

## 2013-04-24 DIAGNOSIS — L97209 Non-pressure chronic ulcer of unspecified calf with unspecified severity: Secondary | ICD-10-CM | POA: Diagnosis not present

## 2013-04-24 DIAGNOSIS — E119 Type 2 diabetes mellitus without complications: Secondary | ICD-10-CM | POA: Diagnosis not present

## 2013-04-24 DIAGNOSIS — Z435 Encounter for attention to cystostomy: Secondary | ICD-10-CM | POA: Diagnosis not present

## 2013-04-24 DIAGNOSIS — M6281 Muscle weakness (generalized): Secondary | ICD-10-CM | POA: Diagnosis not present

## 2013-04-25 DIAGNOSIS — N200 Calculus of kidney: Secondary | ICD-10-CM | POA: Diagnosis not present

## 2013-04-25 DIAGNOSIS — Z435 Encounter for attention to cystostomy: Secondary | ICD-10-CM | POA: Diagnosis not present

## 2013-04-25 DIAGNOSIS — I872 Venous insufficiency (chronic) (peripheral): Secondary | ICD-10-CM | POA: Diagnosis not present

## 2013-04-25 DIAGNOSIS — M6281 Muscle weakness (generalized): Secondary | ICD-10-CM | POA: Diagnosis not present

## 2013-04-25 DIAGNOSIS — L97209 Non-pressure chronic ulcer of unspecified calf with unspecified severity: Secondary | ICD-10-CM | POA: Diagnosis not present

## 2013-04-25 DIAGNOSIS — E119 Type 2 diabetes mellitus without complications: Secondary | ICD-10-CM | POA: Diagnosis not present

## 2013-04-28 DIAGNOSIS — L97209 Non-pressure chronic ulcer of unspecified calf with unspecified severity: Secondary | ICD-10-CM | POA: Diagnosis not present

## 2013-04-28 DIAGNOSIS — N200 Calculus of kidney: Secondary | ICD-10-CM | POA: Diagnosis not present

## 2013-04-28 DIAGNOSIS — Z435 Encounter for attention to cystostomy: Secondary | ICD-10-CM | POA: Diagnosis not present

## 2013-04-28 DIAGNOSIS — E119 Type 2 diabetes mellitus without complications: Secondary | ICD-10-CM | POA: Diagnosis not present

## 2013-04-28 DIAGNOSIS — M6281 Muscle weakness (generalized): Secondary | ICD-10-CM | POA: Diagnosis not present

## 2013-04-28 DIAGNOSIS — I872 Venous insufficiency (chronic) (peripheral): Secondary | ICD-10-CM | POA: Diagnosis not present

## 2013-04-29 DIAGNOSIS — M6281 Muscle weakness (generalized): Secondary | ICD-10-CM | POA: Diagnosis not present

## 2013-04-29 DIAGNOSIS — I872 Venous insufficiency (chronic) (peripheral): Secondary | ICD-10-CM | POA: Diagnosis not present

## 2013-04-29 DIAGNOSIS — E119 Type 2 diabetes mellitus without complications: Secondary | ICD-10-CM | POA: Diagnosis not present

## 2013-04-29 DIAGNOSIS — Z435 Encounter for attention to cystostomy: Secondary | ICD-10-CM | POA: Diagnosis not present

## 2013-04-29 DIAGNOSIS — L97209 Non-pressure chronic ulcer of unspecified calf with unspecified severity: Secondary | ICD-10-CM | POA: Diagnosis not present

## 2013-04-29 DIAGNOSIS — N200 Calculus of kidney: Secondary | ICD-10-CM | POA: Diagnosis not present

## 2013-04-30 ENCOUNTER — Ambulatory Visit (INDEPENDENT_AMBULATORY_CARE_PROVIDER_SITE_OTHER): Payer: Medicare Other | Admitting: Physician Assistant

## 2013-04-30 ENCOUNTER — Encounter: Payer: Self-pay | Admitting: Physician Assistant

## 2013-04-30 VITALS — BP 108/72 | HR 85 | Temp 97.6°F | Resp 16 | Ht 71.0 in

## 2013-04-30 DIAGNOSIS — I83019 Varicose veins of right lower extremity with ulcer of unspecified site: Secondary | ICD-10-CM

## 2013-04-30 DIAGNOSIS — L97919 Non-pressure chronic ulcer of unspecified part of right lower leg with unspecified severity: Secondary | ICD-10-CM

## 2013-04-30 DIAGNOSIS — G8929 Other chronic pain: Secondary | ICD-10-CM

## 2013-04-30 DIAGNOSIS — N200 Calculus of kidney: Secondary | ICD-10-CM

## 2013-04-30 DIAGNOSIS — E119 Type 2 diabetes mellitus without complications: Secondary | ICD-10-CM | POA: Diagnosis not present

## 2013-04-30 DIAGNOSIS — I83009 Varicose veins of unspecified lower extremity with ulcer of unspecified site: Secondary | ICD-10-CM

## 2013-04-30 DIAGNOSIS — L97909 Non-pressure chronic ulcer of unspecified part of unspecified lower leg with unspecified severity: Secondary | ICD-10-CM

## 2013-04-30 MED ORDER — HYDROCODONE-ACETAMINOPHEN 5-325 MG PO TABS: 1.0000 | ORAL_TABLET | Freq: Four times a day (QID) | ORAL | Status: DC | PRN

## 2013-04-30 NOTE — Patient Instructions (Signed)
Please obtain labs. I will call you with your results.  Have the form for your diabetic shoes faxed to our office.  The fax number is [phone].  I will extend your Physical Therapy and nursing at home.  Please follow-up with Dr. Amalia Hailey as scheduled for your cystectomy. Once surgery is over, you will need to return to clinic so we can discuss restarting Coumadin.

## 2013-04-30 NOTE — Progress Notes (Signed)
Patient presents to clinic today for hospital follow-up. Records obtained via [place]. Patient recently had a Cystoscopy with retrograde pyelogram at [facility]. Patient was then kept overnight for antibiotic administration and observation.  Patient became febrile and had to be transferred to the ICU.  Patient was stabilized and then sent home with PO antibiotics.  Since discharge, patient states he has been doing very well.  Endorses good urinary output.  Denies fever, chills, sweats. Patient is doing well.  Is very optimistic.  Denies chest pain, shortness of breath, leg swelling. Finished antibiotics that were prescribed at discharge.  Patient has been seen by Urology for follow-up. Was noted to have a venous stasis ulcer of LLE.  Has already been evaluated by Vascular Surgery.  Home Care is tending to area of concern.  Patient is very happy because he is being fitted for prosthetic leg. Is excited because he is ready to "walk around" and be more independent.  Patient is scheduled for Cystectomy at [facility] on 05/12/13.  Patient is currently being kept off of his coumadin until after his final surgery.  This decision was made by the surgeon.  Past Medical History  Diagnosis Date  . Sleep apnea     uses cpap  . Hypothyroidism   . Anemia   . Blood transfusion   . Chronic kidney disease     hx of kidney stones  . Hypertension   . Arthritis     osteoarthritis  . Pneumonia   . Diabetes mellitus     insulin dependent  . DVT (deep vein thrombosis) in pregnancy   . Peripheral vascular disease   . Hyperlipidemia   . Morbid obesity   . Hypertrophy of prostate   . Acute respiratory failure   . Cellulitis and abscess of leg   . Chronic kidney disease, stage III (moderate)   . Nephrolithiasis   . Anxiety   . Chronic pain     Current Outpatient Prescriptions on File Prior to Visit  Medication Sig Dispense Refill  . atorvastatin (LIPITOR) 40 MG tablet TAKE 1 TABLET  EVERY DAY  30 tablet  2  . cyclobenzaprine (FLEXERIL) 5 MG tablet Take 5 mg by mouth 3 times/day as needed-between meals & bedtime.       . docusate sodium 100 MG CAPS Take 100 mg by mouth 2 (two) times daily.      . ferrous sulfate 325 (65 FE) MG tablet Take 325 mg by mouth 2 (two) times daily.       Marland Kitchen glucose blood (BAYER CONTOUR TEST) test strip Use as instructed to check blood sugar 3 - 4 times daily.  Dx 250.00  100 each  3  . insulin aspart (NOVOLOG) 100 UNIT/ML injection INJECT 3 UNITS THREE TIMES DAILY WITH MEALS. AND AT NIGHTIME IF BLOOD SUGARS GREATER THAN 150  20 mL  2  . insulin glargine (LANTUS) 100 UNIT/ML injection PATIENT REPORTS THAT HE TAKES 40 UNITS DAILY.  40 mL  5  . Insulin Syringe-Needle U-100 (INSULIN SYRINGE .5CC/31GX5/16") 31G X 5/16" 0.5 ML MISC USE AS DIRECTED TO INJECT INSULIN FOUR TIMES DAILY Dx: 250.00  120 each  5  . levothyroxine (SYNTHROID, LEVOTHROID) 200 MCG tablet TAKE 1 TABLET (200 MCG TOTAL) BY MOUTH EVERY MORNING.  30 tablet  2  . lidocaine (XYLOCAINE) 2 % jelly Apply topically every 8 (eight) hours as needed (urethral pain).  30 mL  0  . Multiple Vitamins-Minerals (MULTIVITAMINS THER. W/MINERALS) TABS Take 1 tablet  by mouth daily.       . tamsulosin (FLOMAX) 0.4 MG CAPS capsule Take 0.4 mg by mouth at bedtime.       . enoxaparin (LOVENOX) 150 MG/ML injection Inject 1.32 mLs (200 mg total) into the skin daily.  3 Syringe  0  . warfarin (COUMADIN) 2 MG tablet Take 1 tablet (2 mg total) by mouth daily at 6 PM.  30 tablet  2   No current facility-administered medications on file prior to visit.    No Known Allergies  Family History  Problem Relation Age of Onset  . Diabetes Mother   . Heart attack Mother   . Stroke Mother   . Colon cancer Father   . Dementia Father   . Heart attack Father   . Arthritis Paternal Grandmother     History   Social History  . Marital Status: Married    Spouse Name: N/A    Number of Children: N/A  . Years of  Education: N/A   Social History Main Topics  . Smoking status: Never Smoker   . Smokeless tobacco: Never Used  . Alcohol Use: No  . Drug Use: No  . Sexual Activity: Yes   Other Topics Concern  . None   Social History Narrative   Lives with his wife and uses a wheelchair for transfers.     Review of Systems - See HPI.  All other ROS are negative.  BP 108/72  Pulse 85  Temp(Src) 97.6 F (36.4 C) (Oral)  Resp 16  Ht '5\' 11"'  (1.803 m)  SpO2 97%  Physical Exam  Vitals reviewed. Constitutional: He is oriented to person, place, and time and well-developed, well-nourished, and in no distress.  HENT:  Head: Normocephalic and atraumatic.  Right Ear: External ear normal.  Left Ear: External ear normal.  Nose: Nose normal.  Mouth/Throat: Oropharynx is clear and moist. No oropharyngeal exudate.  Eyes: Conjunctivae are normal. Pupils are equal, round, and reactive to light.  Neck: Neck supple.  Cardiovascular: Normal rate, regular rhythm, normal heart sounds and intact distal pulses.   Pulses:      Dorsalis pedis pulses are 2+ on the right side. Left dorsalis pedis pulse not accessible.       Posterior tibial pulses are 2+ on the right side. Left posterior tibial pulse not accessible.  Presence of a superficial venous stasis ulcer of RLE without evidence of superimposed cellulitis. LLE s/p BKA.  No evidence of swelling of venous stasis ulceration.  Pulmonary/Chest: Effort normal and breath sounds normal. No respiratory distress. He has no wheezes. He has no rales. He exhibits no tenderness.  Neurological: He is alert and oriented to person, place, and time.  Skin: Skin is warm and dry. No rash noted.  Psychiatric: Affect normal.    Recent Results (from the past 2160 hour(s))  CBC WITH DIFFERENTIAL     Status: Abnormal   Collection Time    03/07/13  5:19 PM      Result Value Ref Range   WBC 5.4  4.0 - 10.5 K/uL   Comment: Age of specimen may affect integrity of results.   RBC  3.00 (*) 4.22 - 5.81 MIL/uL   Hemoglobin 8.4 (*) 13.0 - 17.0 g/dL   HCT 27.1 (*) 39.0 - 52.0 %   MCV 90.3  78.0 - 100.0 fL   MCH 28.0  26.0 - 34.0 pg   MCHC 31.0  30.0 - 36.0 g/dL   RDW 17.4 (*) 11.5 - 15.5 %  Platelets 209  150 - 400 K/uL   Neutrophils Relative % 72  43 - 77 %   Neutro Abs 4.2  1.7 - 7.7 K/uL   Lymphocytes Relative 13  12 - 46 %   Lymphs Abs 0.7  0.7 - 4.0 K/uL   Monocytes Relative 7  3 - 12 %   Monocytes Absolute 0.4  0.1 - 1.0 K/uL   Eosinophils Relative 2  0 - 5 %   Eosinophils Absolute 0.1  0.0 - 0.7 K/uL   Basophils Relative 1  0 - 1 %   Basophils Absolute 0.0  0.0 - 0.1 K/uL   Smear Review       Comment: Vacuolated neutrophils  BASIC METABOLIC PANEL WITH GFR     Status: Abnormal   Collection Time    03/07/13  5:19 PM      Result Value Ref Range   Sodium 137  135 - 145 mEq/L   Potassium 4.2  3.5 - 5.3 mEq/L   Chloride 105  96 - 112 mEq/L   CO2 20  19 - 32 mEq/L   Glucose, Bld 127 (*) 70 - 99 mg/dL   BUN 36 (*) 6 - 23 mg/dL   Creat 2.98 (*) 0.50 - 1.35 mg/dL   Calcium 8.0 (*) 8.4 - 10.5 mg/dL   GFR, Est African American 26 (*)    GFR, Est Non African American 23 (*)    Comment:       The estimated GFR is a calculation valid for adults (>=44 years old)     that uses the CKD-EPI algorithm to adjust for age and sex. It is       not to be used for children, pregnant women, hospitalized patients,        patients on dialysis, or with rapidly changing kidney function.     According to the NKDEP, eGFR >89 is normal, 60-89 shows mild     impairment, 30-59 shows moderate impairment, 15-29 shows severe     impairment and <15 is ESRD.        PROTIME-INR     Status: Abnormal   Collection Time    03/07/13  5:19 PM      Result Value Ref Range   Prothrombin Time 20.3 (*) 11.6 - 15.2 seconds   INR 1.78 (*) <1.50   Comment: The INR is of principal utility in following patients on stable doses     of oral anticoagulants.  The therapeutic range is generally 2.0 to      3.0, but may be 3.0 to 4.0 in patients with mechanical cardiac valves,     recurrent embolisms and antiphospholipid antibodies (including lupus     inhibitors).  URINALYSIS, ROUTINE W REFLEX MICROSCOPIC     Status: Abnormal   Collection Time    03/07/13  5:19 PM      Result Value Ref Range   Color, Urine YELLOW  YELLOW   APPearance TURBID (*) CLEAR   Specific Gravity, Urine 1.013  1.005 - 1.030   pH 6.0  5.0 - 8.0   Glucose, UA NEG  NEG mg/dL   Bilirubin Urine NEG  NEG   Ketones, ur NEG  NEG mg/dL   Hgb urine dipstick LARGE (*) NEG   Protein, ur 100 (*) NEG mg/dL   Urobilinogen, UA 0.2  0.0 - 1.0 mg/dL   Nitrite NEG  NEG   Leukocytes, UA LARGE (*) NEG  URINALYSIS, MICROSCOPIC ONLY     Status: Abnormal  Collection Time    03/07/13  5:19 PM      Result Value Ref Range   Squamous Epithelial / LPF NONE SEEN  RARE   Crystals NONE SEEN  NONE SEEN   Casts NONE SEEN  NONE SEEN   WBC, UA >50 (*) <3 WBC/hpf   RBC / HPF >50 (*) <3 RBC/hpf   Bacteria, UA NONE SEEN  RARE  URINE CULTURE     Status: None   Collection Time    03/07/13  5:21 PM      Result Value Ref Range   Colony Count 9,000 COLONIES/ML     Organism ID, Bacteria Insignificant Growth      Assessment/Plan: Nephrolithiasis Patient doing well after Cystocscopy and Retrograde Pyelogram.  Has upcoming surgery for Cystectomy and Ostomy.  Will obtain labs.  Coumadin is being held until after upcoming surgery per Urology.

## 2013-04-30 NOTE — Progress Notes (Signed)
Pre visit review using our clinic review tool, if applicable. No additional management support is needed unless otherwise documented below in the visit note/SLS  

## 2013-05-01 ENCOUNTER — Telehealth: Payer: Self-pay

## 2013-05-01 DIAGNOSIS — E119 Type 2 diabetes mellitus without complications: Secondary | ICD-10-CM | POA: Diagnosis not present

## 2013-05-01 DIAGNOSIS — L97209 Non-pressure chronic ulcer of unspecified calf with unspecified severity: Secondary | ICD-10-CM | POA: Diagnosis not present

## 2013-05-01 DIAGNOSIS — Z435 Encounter for attention to cystostomy: Secondary | ICD-10-CM | POA: Diagnosis not present

## 2013-05-01 DIAGNOSIS — I872 Venous insufficiency (chronic) (peripheral): Secondary | ICD-10-CM | POA: Diagnosis not present

## 2013-05-01 DIAGNOSIS — N200 Calculus of kidney: Secondary | ICD-10-CM | POA: Diagnosis not present

## 2013-05-01 DIAGNOSIS — M6281 Muscle weakness (generalized): Secondary | ICD-10-CM | POA: Diagnosis not present

## 2013-05-01 NOTE — Telephone Encounter (Signed)
Relevant patient education assigned to patient using Emmi. ° °

## 2013-05-05 DIAGNOSIS — E119 Type 2 diabetes mellitus without complications: Secondary | ICD-10-CM | POA: Diagnosis not present

## 2013-05-05 DIAGNOSIS — N200 Calculus of kidney: Secondary | ICD-10-CM | POA: Diagnosis not present

## 2013-05-05 DIAGNOSIS — I872 Venous insufficiency (chronic) (peripheral): Secondary | ICD-10-CM | POA: Diagnosis not present

## 2013-05-05 DIAGNOSIS — L97209 Non-pressure chronic ulcer of unspecified calf with unspecified severity: Secondary | ICD-10-CM | POA: Diagnosis not present

## 2013-05-05 DIAGNOSIS — M6281 Muscle weakness (generalized): Secondary | ICD-10-CM | POA: Diagnosis not present

## 2013-05-05 DIAGNOSIS — Z435 Encounter for attention to cystostomy: Secondary | ICD-10-CM | POA: Diagnosis not present

## 2013-05-07 ENCOUNTER — Other Ambulatory Visit: Payer: Self-pay | Admitting: Physician Assistant

## 2013-05-08 DIAGNOSIS — I872 Venous insufficiency (chronic) (peripheral): Secondary | ICD-10-CM | POA: Diagnosis not present

## 2013-05-08 DIAGNOSIS — N200 Calculus of kidney: Secondary | ICD-10-CM | POA: Diagnosis not present

## 2013-05-08 DIAGNOSIS — M6281 Muscle weakness (generalized): Secondary | ICD-10-CM | POA: Diagnosis not present

## 2013-05-08 DIAGNOSIS — L97209 Non-pressure chronic ulcer of unspecified calf with unspecified severity: Secondary | ICD-10-CM | POA: Diagnosis not present

## 2013-05-08 DIAGNOSIS — Z435 Encounter for attention to cystostomy: Secondary | ICD-10-CM | POA: Diagnosis not present

## 2013-05-08 DIAGNOSIS — E119 Type 2 diabetes mellitus without complications: Secondary | ICD-10-CM | POA: Diagnosis not present

## 2013-05-09 ENCOUNTER — Telehealth: Payer: Self-pay | Admitting: *Deleted

## 2013-05-09 DIAGNOSIS — E119 Type 2 diabetes mellitus without complications: Secondary | ICD-10-CM | POA: Diagnosis not present

## 2013-05-09 DIAGNOSIS — Z435 Encounter for attention to cystostomy: Secondary | ICD-10-CM | POA: Diagnosis not present

## 2013-05-09 DIAGNOSIS — N200 Calculus of kidney: Secondary | ICD-10-CM | POA: Diagnosis not present

## 2013-05-09 DIAGNOSIS — L97209 Non-pressure chronic ulcer of unspecified calf with unspecified severity: Secondary | ICD-10-CM | POA: Diagnosis not present

## 2013-05-09 DIAGNOSIS — M6281 Muscle weakness (generalized): Secondary | ICD-10-CM | POA: Diagnosis not present

## 2013-05-09 DIAGNOSIS — I872 Venous insufficiency (chronic) (peripheral): Secondary | ICD-10-CM | POA: Diagnosis not present

## 2013-05-09 NOTE — Telephone Encounter (Signed)
Received message from [facility] w/[facility]. States pt's certification will run out after today. When she saw pt yesterday she noted a new wound on pt's right lower leg. States pt's skin is extremely dry and she usually applies vaseline to his legs. Thinks this may be the cause of pt's new wound. She is aware that pt is going in to the hospital this weekend for ostomy placement but she wanted to proceed with re-certification based on pt's new wound. Gave verbal authorization to re-certify pt.

## 2013-05-09 NOTE — Telephone Encounter (Signed)
Thank you for doing that [place].

## 2013-05-10 DIAGNOSIS — E119 Type 2 diabetes mellitus without complications: Secondary | ICD-10-CM | POA: Diagnosis not present

## 2013-05-10 DIAGNOSIS — L97209 Non-pressure chronic ulcer of unspecified calf with unspecified severity: Secondary | ICD-10-CM | POA: Diagnosis not present

## 2013-05-10 DIAGNOSIS — M6281 Muscle weakness (generalized): Secondary | ICD-10-CM | POA: Diagnosis not present

## 2013-05-10 DIAGNOSIS — Z435 Encounter for attention to cystostomy: Secondary | ICD-10-CM | POA: Diagnosis not present

## 2013-05-10 DIAGNOSIS — N200 Calculus of kidney: Secondary | ICD-10-CM | POA: Diagnosis not present

## 2013-05-10 DIAGNOSIS — I872 Venous insufficiency (chronic) (peripheral): Secondary | ICD-10-CM | POA: Diagnosis not present

## 2013-05-11 DIAGNOSIS — A499 Bacterial infection, unspecified: Secondary | ICD-10-CM | POA: Diagnosis present

## 2013-05-11 DIAGNOSIS — G4733 Obstructive sleep apnea (adult) (pediatric): Secondary | ICD-10-CM | POA: Diagnosis not present

## 2013-05-11 DIAGNOSIS — N3010 Interstitial cystitis (chronic) without hematuria: Secondary | ICD-10-CM | POA: Diagnosis not present

## 2013-05-11 DIAGNOSIS — I129 Hypertensive chronic kidney disease with stage 1 through stage 4 chronic kidney disease, or unspecified chronic kidney disease: Secondary | ICD-10-CM | POA: Diagnosis present

## 2013-05-11 DIAGNOSIS — G8918 Other acute postprocedural pain: Secondary | ICD-10-CM | POA: Diagnosis not present

## 2013-05-11 DIAGNOSIS — Z86718 Personal history of other venous thrombosis and embolism: Secondary | ICD-10-CM | POA: Diagnosis not present

## 2013-05-11 DIAGNOSIS — N184 Chronic kidney disease, stage 4 (severe): Secondary | ICD-10-CM | POA: Diagnosis not present

## 2013-05-11 DIAGNOSIS — S88119A Complete traumatic amputation at level between knee and ankle, unspecified lower leg, initial encounter: Secondary | ICD-10-CM | POA: Diagnosis not present

## 2013-05-11 DIAGNOSIS — D631 Anemia in chronic kidney disease: Secondary | ICD-10-CM | POA: Diagnosis not present

## 2013-05-11 DIAGNOSIS — N289 Disorder of kidney and ureter, unspecified: Secondary | ICD-10-CM | POA: Diagnosis not present

## 2013-05-11 DIAGNOSIS — E669 Obesity, unspecified: Secondary | ICD-10-CM | POA: Diagnosis present

## 2013-05-11 DIAGNOSIS — A4901 Methicillin susceptible Staphylococcus aureus infection, unspecified site: Secondary | ICD-10-CM | POA: Diagnosis present

## 2013-05-11 DIAGNOSIS — N1330 Unspecified hydronephrosis: Secondary | ICD-10-CM | POA: Diagnosis present

## 2013-05-11 DIAGNOSIS — E119 Type 2 diabetes mellitus without complications: Secondary | ICD-10-CM | POA: Diagnosis not present

## 2013-05-11 DIAGNOSIS — R599 Enlarged lymph nodes, unspecified: Secondary | ICD-10-CM | POA: Diagnosis not present

## 2013-05-11 DIAGNOSIS — B9689 Other specified bacterial agents as the cause of diseases classified elsewhere: Secondary | ICD-10-CM | POA: Diagnosis present

## 2013-05-11 DIAGNOSIS — Z86711 Personal history of pulmonary embolism: Secondary | ICD-10-CM | POA: Diagnosis not present

## 2013-05-11 DIAGNOSIS — I739 Peripheral vascular disease, unspecified: Secondary | ICD-10-CM | POA: Diagnosis present

## 2013-05-11 DIAGNOSIS — N119 Chronic tubulo-interstitial nephritis, unspecified: Secondary | ICD-10-CM | POA: Diagnosis not present

## 2013-05-11 DIAGNOSIS — I10 Essential (primary) hypertension: Secondary | ICD-10-CM | POA: Diagnosis not present

## 2013-05-11 DIAGNOSIS — R9431 Abnormal electrocardiogram [ECG] [EKG]: Secondary | ICD-10-CM | POA: Diagnosis not present

## 2013-05-11 DIAGNOSIS — D62 Acute posthemorrhagic anemia: Secondary | ICD-10-CM | POA: Diagnosis not present

## 2013-05-11 DIAGNOSIS — N319 Neuromuscular dysfunction of bladder, unspecified: Secondary | ICD-10-CM | POA: Diagnosis not present

## 2013-05-11 DIAGNOSIS — B3789 Other sites of candidiasis: Secondary | ICD-10-CM | POA: Diagnosis present

## 2013-05-11 DIAGNOSIS — G8929 Other chronic pain: Secondary | ICD-10-CM | POA: Diagnosis present

## 2013-05-18 DIAGNOSIS — S88119A Complete traumatic amputation at level between knee and ankle, unspecified lower leg, initial encounter: Secondary | ICD-10-CM | POA: Diagnosis not present

## 2013-05-18 DIAGNOSIS — Z452 Encounter for adjustment and management of vascular access device: Secondary | ICD-10-CM | POA: Diagnosis not present

## 2013-05-18 DIAGNOSIS — E119 Type 2 diabetes mellitus without complications: Secondary | ICD-10-CM | POA: Diagnosis not present

## 2013-05-18 DIAGNOSIS — Z48816 Encounter for surgical aftercare following surgery on the genitourinary system: Secondary | ICD-10-CM | POA: Diagnosis not present

## 2013-05-18 DIAGNOSIS — Z436 Encounter for attention to other artificial openings of urinary tract: Secondary | ICD-10-CM | POA: Diagnosis not present

## 2013-05-18 DIAGNOSIS — Z5181 Encounter for therapeutic drug level monitoring: Secondary | ICD-10-CM | POA: Diagnosis not present

## 2013-05-18 DIAGNOSIS — Z794 Long term (current) use of insulin: Secondary | ICD-10-CM | POA: Diagnosis not present

## 2013-05-18 DIAGNOSIS — I10 Essential (primary) hypertension: Secondary | ICD-10-CM | POA: Diagnosis not present

## 2013-05-18 DIAGNOSIS — N3010 Interstitial cystitis (chronic) without hematuria: Secondary | ICD-10-CM | POA: Diagnosis not present

## 2013-05-19 DIAGNOSIS — Z452 Encounter for adjustment and management of vascular access device: Secondary | ICD-10-CM | POA: Diagnosis not present

## 2013-05-19 DIAGNOSIS — E119 Type 2 diabetes mellitus without complications: Secondary | ICD-10-CM | POA: Diagnosis not present

## 2013-05-19 DIAGNOSIS — Z48816 Encounter for surgical aftercare following surgery on the genitourinary system: Secondary | ICD-10-CM | POA: Diagnosis not present

## 2013-05-19 DIAGNOSIS — I10 Essential (primary) hypertension: Secondary | ICD-10-CM | POA: Diagnosis not present

## 2013-05-19 DIAGNOSIS — N3010 Interstitial cystitis (chronic) without hematuria: Secondary | ICD-10-CM | POA: Diagnosis not present

## 2013-05-19 DIAGNOSIS — Z436 Encounter for attention to other artificial openings of urinary tract: Secondary | ICD-10-CM | POA: Diagnosis not present

## 2013-05-20 DIAGNOSIS — Z452 Encounter for adjustment and management of vascular access device: Secondary | ICD-10-CM | POA: Diagnosis not present

## 2013-05-20 DIAGNOSIS — Z48816 Encounter for surgical aftercare following surgery on the genitourinary system: Secondary | ICD-10-CM | POA: Diagnosis not present

## 2013-05-20 DIAGNOSIS — Z436 Encounter for attention to other artificial openings of urinary tract: Secondary | ICD-10-CM | POA: Diagnosis not present

## 2013-05-20 DIAGNOSIS — I10 Essential (primary) hypertension: Secondary | ICD-10-CM | POA: Diagnosis not present

## 2013-05-20 DIAGNOSIS — E119 Type 2 diabetes mellitus without complications: Secondary | ICD-10-CM | POA: Diagnosis not present

## 2013-05-20 DIAGNOSIS — N3010 Interstitial cystitis (chronic) without hematuria: Secondary | ICD-10-CM | POA: Diagnosis not present

## 2013-05-21 ENCOUNTER — Telehealth: Payer: Self-pay | Admitting: Physician Assistant

## 2013-05-21 DIAGNOSIS — Z452 Encounter for adjustment and management of vascular access device: Secondary | ICD-10-CM | POA: Diagnosis not present

## 2013-05-21 DIAGNOSIS — Z48816 Encounter for surgical aftercare following surgery on the genitourinary system: Secondary | ICD-10-CM | POA: Diagnosis not present

## 2013-05-21 DIAGNOSIS — I83019 Varicose veins of right lower extremity with ulcer of unspecified site: Secondary | ICD-10-CM | POA: Insufficient documentation

## 2013-05-21 DIAGNOSIS — E119 Type 2 diabetes mellitus without complications: Secondary | ICD-10-CM | POA: Diagnosis not present

## 2013-05-21 DIAGNOSIS — Z436 Encounter for attention to other artificial openings of urinary tract: Secondary | ICD-10-CM | POA: Diagnosis not present

## 2013-05-21 DIAGNOSIS — I10 Essential (primary) hypertension: Secondary | ICD-10-CM | POA: Diagnosis not present

## 2013-05-21 DIAGNOSIS — G8929 Other chronic pain: Secondary | ICD-10-CM | POA: Insufficient documentation

## 2013-05-21 DIAGNOSIS — L97919 Non-pressure chronic ulcer of unspecified part of right lower leg with unspecified severity: Secondary | ICD-10-CM

## 2013-05-21 DIAGNOSIS — N3010 Interstitial cystitis (chronic) without hematuria: Secondary | ICD-10-CM | POA: Diagnosis not present

## 2013-05-21 NOTE — Telephone Encounter (Signed)
Ok to [employer] order.

## 2013-05-21 NOTE — Assessment & Plan Note (Signed)
Healing.  Vascular surgery recommendations have been reviewed.  Continue home wound care.

## 2013-05-21 NOTE — Assessment & Plan Note (Signed)
Patient doing well after Cystocscopy and Retrograde Pyelogram.  Has upcoming surgery for Cystectomy and Ostomy.  Will obtain labs.  Coumadin is being held until after upcoming surgery per Urology.

## 2013-05-21 NOTE — Telephone Encounter (Signed)
[facility] ORDER TO CONTINUE VASELINE GAUZE KERLEX AND COBAN TO BI LATERAL LOWER EXTREMETIES

## 2013-05-22 ENCOUNTER — Telehealth: Payer: Self-pay | Admitting: Physician Assistant

## 2013-05-22 DIAGNOSIS — Z436 Encounter for attention to other artificial openings of urinary tract: Secondary | ICD-10-CM | POA: Diagnosis not present

## 2013-05-22 DIAGNOSIS — Z48816 Encounter for surgical aftercare following surgery on the genitourinary system: Secondary | ICD-10-CM | POA: Diagnosis not present

## 2013-05-22 DIAGNOSIS — E119 Type 2 diabetes mellitus without complications: Secondary | ICD-10-CM | POA: Diagnosis not present

## 2013-05-22 DIAGNOSIS — I10 Essential (primary) hypertension: Secondary | ICD-10-CM | POA: Diagnosis not present

## 2013-05-22 DIAGNOSIS — N3010 Interstitial cystitis (chronic) without hematuria: Secondary | ICD-10-CM | POA: Diagnosis not present

## 2013-05-22 DIAGNOSIS — Z452 Encounter for adjustment and management of vascular access device: Secondary | ICD-10-CM | POA: Diagnosis not present

## 2013-05-22 NOTE — Telephone Encounter (Signed)
Eliezer Lofts with [facility] to give verbal

## 2013-05-22 NOTE — Telephone Encounter (Signed)
She wants to use vaseline gauze with coban   He has a small wound on right lower leg and his left thigh

## 2013-05-23 DIAGNOSIS — N3010 Interstitial cystitis (chronic) without hematuria: Secondary | ICD-10-CM | POA: Diagnosis not present

## 2013-05-23 DIAGNOSIS — E119 Type 2 diabetes mellitus without complications: Secondary | ICD-10-CM | POA: Diagnosis not present

## 2013-05-23 DIAGNOSIS — Z48816 Encounter for surgical aftercare following surgery on the genitourinary system: Secondary | ICD-10-CM | POA: Diagnosis not present

## 2013-05-23 DIAGNOSIS — Z436 Encounter for attention to other artificial openings of urinary tract: Secondary | ICD-10-CM | POA: Diagnosis not present

## 2013-05-23 DIAGNOSIS — I10 Essential (primary) hypertension: Secondary | ICD-10-CM | POA: Diagnosis not present

## 2013-05-23 DIAGNOSIS — Z452 Encounter for adjustment and management of vascular access device: Secondary | ICD-10-CM | POA: Diagnosis not present

## 2013-05-26 DIAGNOSIS — Z794 Long term (current) use of insulin: Secondary | ICD-10-CM | POA: Diagnosis not present

## 2013-05-26 DIAGNOSIS — Z436 Encounter for attention to other artificial openings of urinary tract: Secondary | ICD-10-CM | POA: Diagnosis not present

## 2013-05-26 DIAGNOSIS — Z48816 Encounter for surgical aftercare following surgery on the genitourinary system: Secondary | ICD-10-CM | POA: Diagnosis not present

## 2013-05-26 DIAGNOSIS — N3010 Interstitial cystitis (chronic) without hematuria: Secondary | ICD-10-CM | POA: Diagnosis not present

## 2013-05-26 DIAGNOSIS — Z452 Encounter for adjustment and management of vascular access device: Secondary | ICD-10-CM | POA: Diagnosis not present

## 2013-05-26 DIAGNOSIS — E119 Type 2 diabetes mellitus without complications: Secondary | ICD-10-CM | POA: Diagnosis not present

## 2013-05-26 DIAGNOSIS — I10 Essential (primary) hypertension: Secondary | ICD-10-CM | POA: Diagnosis not present

## 2013-05-28 ENCOUNTER — Telehealth: Payer: Self-pay | Admitting: *Deleted

## 2013-05-28 DIAGNOSIS — E119 Type 2 diabetes mellitus without complications: Secondary | ICD-10-CM | POA: Diagnosis not present

## 2013-05-28 DIAGNOSIS — N3010 Interstitial cystitis (chronic) without hematuria: Secondary | ICD-10-CM | POA: Diagnosis not present

## 2013-05-28 DIAGNOSIS — Z436 Encounter for attention to other artificial openings of urinary tract: Secondary | ICD-10-CM | POA: Diagnosis not present

## 2013-05-28 DIAGNOSIS — Z48816 Encounter for surgical aftercare following surgery on the genitourinary system: Secondary | ICD-10-CM | POA: Diagnosis not present

## 2013-05-28 DIAGNOSIS — Z452 Encounter for adjustment and management of vascular access device: Secondary | ICD-10-CM | POA: Diagnosis not present

## 2013-05-28 DIAGNOSIS — I10 Essential (primary) hypertension: Secondary | ICD-10-CM | POA: Diagnosis not present

## 2013-05-28 NOTE — Telephone Encounter (Signed)
Spoke with [name] w/[facility] to give Verbal Physician Orders from PCP [WCM]: Pt needs CBC w/diff drawn [d/t HgB 7.9], informed to stay well hydrated and make sure at his f/u visit with Surgeon [[facility]] is aware of lab results. [name] understood & agreed; she will be going out to patient's home today to fulfill orders/SLS

## 2013-05-29 DIAGNOSIS — Z436 Encounter for attention to other artificial openings of urinary tract: Secondary | ICD-10-CM | POA: Diagnosis not present

## 2013-05-29 DIAGNOSIS — Z48816 Encounter for surgical aftercare following surgery on the genitourinary system: Secondary | ICD-10-CM | POA: Diagnosis not present

## 2013-05-29 DIAGNOSIS — N3010 Interstitial cystitis (chronic) without hematuria: Secondary | ICD-10-CM | POA: Diagnosis not present

## 2013-05-29 DIAGNOSIS — Z452 Encounter for adjustment and management of vascular access device: Secondary | ICD-10-CM | POA: Diagnosis not present

## 2013-05-29 DIAGNOSIS — E119 Type 2 diabetes mellitus without complications: Secondary | ICD-10-CM | POA: Diagnosis not present

## 2013-05-29 DIAGNOSIS — I10 Essential (primary) hypertension: Secondary | ICD-10-CM | POA: Diagnosis not present

## 2013-05-30 DIAGNOSIS — N3010 Interstitial cystitis (chronic) without hematuria: Secondary | ICD-10-CM | POA: Diagnosis not present

## 2013-05-30 DIAGNOSIS — Z436 Encounter for attention to other artificial openings of urinary tract: Secondary | ICD-10-CM | POA: Diagnosis not present

## 2013-05-30 DIAGNOSIS — Z48816 Encounter for surgical aftercare following surgery on the genitourinary system: Secondary | ICD-10-CM | POA: Diagnosis not present

## 2013-05-30 DIAGNOSIS — E119 Type 2 diabetes mellitus without complications: Secondary | ICD-10-CM | POA: Diagnosis not present

## 2013-05-30 DIAGNOSIS — Z452 Encounter for adjustment and management of vascular access device: Secondary | ICD-10-CM | POA: Diagnosis not present

## 2013-05-30 DIAGNOSIS — I10 Essential (primary) hypertension: Secondary | ICD-10-CM | POA: Diagnosis not present

## 2013-06-01 DIAGNOSIS — N3010 Interstitial cystitis (chronic) without hematuria: Secondary | ICD-10-CM | POA: Diagnosis not present

## 2013-06-01 DIAGNOSIS — E119 Type 2 diabetes mellitus without complications: Secondary | ICD-10-CM | POA: Diagnosis not present

## 2013-06-01 DIAGNOSIS — I10 Essential (primary) hypertension: Secondary | ICD-10-CM | POA: Diagnosis not present

## 2013-06-01 DIAGNOSIS — Z452 Encounter for adjustment and management of vascular access device: Secondary | ICD-10-CM | POA: Diagnosis not present

## 2013-06-01 DIAGNOSIS — Z436 Encounter for attention to other artificial openings of urinary tract: Secondary | ICD-10-CM | POA: Diagnosis not present

## 2013-06-01 DIAGNOSIS — Z48816 Encounter for surgical aftercare following surgery on the genitourinary system: Secondary | ICD-10-CM | POA: Diagnosis not present

## 2013-06-03 DIAGNOSIS — Z436 Encounter for attention to other artificial openings of urinary tract: Secondary | ICD-10-CM | POA: Diagnosis not present

## 2013-06-03 DIAGNOSIS — N390 Urinary tract infection, site not specified: Secondary | ICD-10-CM | POA: Diagnosis not present

## 2013-06-03 DIAGNOSIS — N3010 Interstitial cystitis (chronic) without hematuria: Secondary | ICD-10-CM | POA: Diagnosis not present

## 2013-06-04 ENCOUNTER — Other Ambulatory Visit: Payer: Self-pay | Admitting: Physician Assistant

## 2013-06-04 DIAGNOSIS — Z452 Encounter for adjustment and management of vascular access device: Secondary | ICD-10-CM | POA: Diagnosis not present

## 2013-06-04 DIAGNOSIS — N3010 Interstitial cystitis (chronic) without hematuria: Secondary | ICD-10-CM | POA: Diagnosis not present

## 2013-06-04 DIAGNOSIS — Z436 Encounter for attention to other artificial openings of urinary tract: Secondary | ICD-10-CM | POA: Diagnosis not present

## 2013-06-04 DIAGNOSIS — Z48816 Encounter for surgical aftercare following surgery on the genitourinary system: Secondary | ICD-10-CM | POA: Diagnosis not present

## 2013-06-04 DIAGNOSIS — E119 Type 2 diabetes mellitus without complications: Secondary | ICD-10-CM | POA: Diagnosis not present

## 2013-06-04 DIAGNOSIS — I10 Essential (primary) hypertension: Secondary | ICD-10-CM | POA: Diagnosis not present

## 2013-06-04 NOTE — Telephone Encounter (Signed)
Rx request to pharmacy/SLS  

## 2013-06-05 DIAGNOSIS — I10 Essential (primary) hypertension: Secondary | ICD-10-CM | POA: Diagnosis not present

## 2013-06-05 DIAGNOSIS — Z436 Encounter for attention to other artificial openings of urinary tract: Secondary | ICD-10-CM | POA: Diagnosis not present

## 2013-06-05 DIAGNOSIS — E119 Type 2 diabetes mellitus without complications: Secondary | ICD-10-CM | POA: Diagnosis not present

## 2013-06-05 DIAGNOSIS — Z48816 Encounter for surgical aftercare following surgery on the genitourinary system: Secondary | ICD-10-CM | POA: Diagnosis not present

## 2013-06-05 DIAGNOSIS — Z452 Encounter for adjustment and management of vascular access device: Secondary | ICD-10-CM | POA: Diagnosis not present

## 2013-06-05 DIAGNOSIS — N3010 Interstitial cystitis (chronic) without hematuria: Secondary | ICD-10-CM | POA: Diagnosis not present

## 2013-06-06 ENCOUNTER — Telehealth: Payer: Self-pay | Admitting: *Deleted

## 2013-06-06 DIAGNOSIS — E119 Type 2 diabetes mellitus without complications: Secondary | ICD-10-CM | POA: Diagnosis not present

## 2013-06-06 DIAGNOSIS — Z48816 Encounter for surgical aftercare following surgery on the genitourinary system: Secondary | ICD-10-CM | POA: Diagnosis not present

## 2013-06-06 DIAGNOSIS — I10 Essential (primary) hypertension: Secondary | ICD-10-CM | POA: Diagnosis not present

## 2013-06-06 DIAGNOSIS — Z436 Encounter for attention to other artificial openings of urinary tract: Secondary | ICD-10-CM | POA: Diagnosis not present

## 2013-06-06 DIAGNOSIS — Z452 Encounter for adjustment and management of vascular access device: Secondary | ICD-10-CM | POA: Diagnosis not present

## 2013-06-06 DIAGNOSIS — N3010 Interstitial cystitis (chronic) without hematuria: Secondary | ICD-10-CM | POA: Diagnosis not present

## 2013-06-06 NOTE — Telephone Encounter (Signed)
Caller reports that while patient was in hospital, his stump wound care was changed to Vasoline gauze, that is not working & request VO to restart Silver dressing/sls Per Vo, WCM, caller given authority to change wound care dressing to Silver; also, will reminf pt that he still needs to make Hospital f/u appt with our office/SLS

## 2013-06-07 DIAGNOSIS — E119 Type 2 diabetes mellitus without complications: Secondary | ICD-10-CM | POA: Diagnosis not present

## 2013-06-07 DIAGNOSIS — I10 Essential (primary) hypertension: Secondary | ICD-10-CM | POA: Diagnosis not present

## 2013-06-07 DIAGNOSIS — Z452 Encounter for adjustment and management of vascular access device: Secondary | ICD-10-CM | POA: Diagnosis not present

## 2013-06-07 DIAGNOSIS — Z48816 Encounter for surgical aftercare following surgery on the genitourinary system: Secondary | ICD-10-CM | POA: Diagnosis not present

## 2013-06-07 DIAGNOSIS — N3010 Interstitial cystitis (chronic) without hematuria: Secondary | ICD-10-CM | POA: Diagnosis not present

## 2013-06-07 DIAGNOSIS — Z436 Encounter for attention to other artificial openings of urinary tract: Secondary | ICD-10-CM | POA: Diagnosis not present

## 2013-06-09 DIAGNOSIS — Z48816 Encounter for surgical aftercare following surgery on the genitourinary system: Secondary | ICD-10-CM | POA: Diagnosis not present

## 2013-06-09 DIAGNOSIS — Z452 Encounter for adjustment and management of vascular access device: Secondary | ICD-10-CM | POA: Diagnosis not present

## 2013-06-09 DIAGNOSIS — E119 Type 2 diabetes mellitus without complications: Secondary | ICD-10-CM | POA: Diagnosis not present

## 2013-06-09 DIAGNOSIS — N3010 Interstitial cystitis (chronic) without hematuria: Secondary | ICD-10-CM | POA: Diagnosis not present

## 2013-06-09 DIAGNOSIS — Z436 Encounter for attention to other artificial openings of urinary tract: Secondary | ICD-10-CM | POA: Diagnosis not present

## 2013-06-09 DIAGNOSIS — I10 Essential (primary) hypertension: Secondary | ICD-10-CM | POA: Diagnosis not present

## 2013-06-10 ENCOUNTER — Telehealth: Payer: Self-pay

## 2013-06-10 DIAGNOSIS — Z436 Encounter for attention to other artificial openings of urinary tract: Secondary | ICD-10-CM | POA: Diagnosis not present

## 2013-06-10 DIAGNOSIS — N3010 Interstitial cystitis (chronic) without hematuria: Secondary | ICD-10-CM | POA: Diagnosis not present

## 2013-06-10 DIAGNOSIS — Z452 Encounter for adjustment and management of vascular access device: Secondary | ICD-10-CM | POA: Diagnosis not present

## 2013-06-10 DIAGNOSIS — Z48816 Encounter for surgical aftercare following surgery on the genitourinary system: Secondary | ICD-10-CM | POA: Diagnosis not present

## 2013-06-10 DIAGNOSIS — E119 Type 2 diabetes mellitus without complications: Secondary | ICD-10-CM | POA: Diagnosis not present

## 2013-06-10 DIAGNOSIS — I10 Essential (primary) hypertension: Secondary | ICD-10-CM | POA: Diagnosis not present

## 2013-06-10 NOTE — Telephone Encounter (Signed)
[name] with [facility] called stating that he needs a verbal to continue physical therapy?  Please advise?

## 2013-06-10 NOTE — Telephone Encounter (Signed)
Ok to continue PT.  Remind patient due for surgery follow-up

## 2013-06-11 ENCOUNTER — Other Ambulatory Visit: Payer: Self-pay | Admitting: Physician Assistant

## 2013-06-11 DIAGNOSIS — Z452 Encounter for adjustment and management of vascular access device: Secondary | ICD-10-CM | POA: Diagnosis not present

## 2013-06-11 DIAGNOSIS — I10 Essential (primary) hypertension: Secondary | ICD-10-CM | POA: Diagnosis not present

## 2013-06-11 DIAGNOSIS — E119 Type 2 diabetes mellitus without complications: Secondary | ICD-10-CM | POA: Diagnosis not present

## 2013-06-11 DIAGNOSIS — Z48816 Encounter for surgical aftercare following surgery on the genitourinary system: Secondary | ICD-10-CM | POA: Diagnosis not present

## 2013-06-11 DIAGNOSIS — N3010 Interstitial cystitis (chronic) without hematuria: Secondary | ICD-10-CM | POA: Diagnosis not present

## 2013-06-11 DIAGNOSIS — Z436 Encounter for attention to other artificial openings of urinary tract: Secondary | ICD-10-CM | POA: Diagnosis not present

## 2013-06-12 DIAGNOSIS — E119 Type 2 diabetes mellitus without complications: Secondary | ICD-10-CM | POA: Diagnosis not present

## 2013-06-12 DIAGNOSIS — I10 Essential (primary) hypertension: Secondary | ICD-10-CM | POA: Diagnosis not present

## 2013-06-12 DIAGNOSIS — Z436 Encounter for attention to other artificial openings of urinary tract: Secondary | ICD-10-CM | POA: Diagnosis not present

## 2013-06-12 DIAGNOSIS — N3010 Interstitial cystitis (chronic) without hematuria: Secondary | ICD-10-CM | POA: Diagnosis not present

## 2013-06-12 DIAGNOSIS — Z48816 Encounter for surgical aftercare following surgery on the genitourinary system: Secondary | ICD-10-CM | POA: Diagnosis not present

## 2013-06-12 DIAGNOSIS — Z452 Encounter for adjustment and management of vascular access device: Secondary | ICD-10-CM | POA: Diagnosis not present

## 2013-06-12 NOTE — Telephone Encounter (Signed)
LMOM with contact name and number for return call RE: Clarification on which medication currently taking & needs to schedule hospital F/U [30 min] appointment post 2nd surgery per provider instruciotns/SLS

## 2013-06-12 NOTE — Telephone Encounter (Signed)
Caller states he was able to get Order on 04.15.15 for continuation of therapy for pt/SLS

## 2013-06-13 DIAGNOSIS — Z452 Encounter for adjustment and management of vascular access device: Secondary | ICD-10-CM | POA: Diagnosis not present

## 2013-06-13 DIAGNOSIS — Z48816 Encounter for surgical aftercare following surgery on the genitourinary system: Secondary | ICD-10-CM | POA: Diagnosis not present

## 2013-06-13 DIAGNOSIS — I10 Essential (primary) hypertension: Secondary | ICD-10-CM | POA: Diagnosis not present

## 2013-06-13 DIAGNOSIS — Z436 Encounter for attention to other artificial openings of urinary tract: Secondary | ICD-10-CM | POA: Diagnosis not present

## 2013-06-13 DIAGNOSIS — N3010 Interstitial cystitis (chronic) without hematuria: Secondary | ICD-10-CM | POA: Diagnosis not present

## 2013-06-13 DIAGNOSIS — E119 Type 2 diabetes mellitus without complications: Secondary | ICD-10-CM | POA: Diagnosis not present

## 2013-06-16 DIAGNOSIS — E119 Type 2 diabetes mellitus without complications: Secondary | ICD-10-CM | POA: Diagnosis not present

## 2013-06-16 DIAGNOSIS — N3010 Interstitial cystitis (chronic) without hematuria: Secondary | ICD-10-CM | POA: Diagnosis not present

## 2013-06-16 DIAGNOSIS — I10 Essential (primary) hypertension: Secondary | ICD-10-CM | POA: Diagnosis not present

## 2013-06-16 DIAGNOSIS — Z452 Encounter for adjustment and management of vascular access device: Secondary | ICD-10-CM | POA: Diagnosis not present

## 2013-06-16 DIAGNOSIS — Z48816 Encounter for surgical aftercare following surgery on the genitourinary system: Secondary | ICD-10-CM | POA: Diagnosis not present

## 2013-06-16 DIAGNOSIS — Z436 Encounter for attention to other artificial openings of urinary tract: Secondary | ICD-10-CM | POA: Diagnosis not present

## 2013-06-17 DIAGNOSIS — E119 Type 2 diabetes mellitus without complications: Secondary | ICD-10-CM | POA: Diagnosis not present

## 2013-06-17 DIAGNOSIS — N3010 Interstitial cystitis (chronic) without hematuria: Secondary | ICD-10-CM | POA: Diagnosis not present

## 2013-06-17 DIAGNOSIS — Z436 Encounter for attention to other artificial openings of urinary tract: Secondary | ICD-10-CM | POA: Diagnosis not present

## 2013-06-17 DIAGNOSIS — I10 Essential (primary) hypertension: Secondary | ICD-10-CM | POA: Diagnosis not present

## 2013-06-17 DIAGNOSIS — Z48816 Encounter for surgical aftercare following surgery on the genitourinary system: Secondary | ICD-10-CM | POA: Diagnosis not present

## 2013-06-17 DIAGNOSIS — Z452 Encounter for adjustment and management of vascular access device: Secondary | ICD-10-CM | POA: Diagnosis not present

## 2013-06-18 DIAGNOSIS — Z452 Encounter for adjustment and management of vascular access device: Secondary | ICD-10-CM | POA: Diagnosis not present

## 2013-06-18 DIAGNOSIS — E119 Type 2 diabetes mellitus without complications: Secondary | ICD-10-CM | POA: Diagnosis not present

## 2013-06-18 DIAGNOSIS — I10 Essential (primary) hypertension: Secondary | ICD-10-CM | POA: Diagnosis not present

## 2013-06-18 DIAGNOSIS — N3010 Interstitial cystitis (chronic) without hematuria: Secondary | ICD-10-CM | POA: Diagnosis not present

## 2013-06-18 DIAGNOSIS — Z436 Encounter for attention to other artificial openings of urinary tract: Secondary | ICD-10-CM | POA: Diagnosis not present

## 2013-06-18 DIAGNOSIS — Z48816 Encounter for surgical aftercare following surgery on the genitourinary system: Secondary | ICD-10-CM | POA: Diagnosis not present

## 2013-06-19 DIAGNOSIS — N3010 Interstitial cystitis (chronic) without hematuria: Secondary | ICD-10-CM | POA: Diagnosis not present

## 2013-06-19 DIAGNOSIS — E119 Type 2 diabetes mellitus without complications: Secondary | ICD-10-CM | POA: Diagnosis not present

## 2013-06-19 DIAGNOSIS — Z452 Encounter for adjustment and management of vascular access device: Secondary | ICD-10-CM | POA: Diagnosis not present

## 2013-06-19 DIAGNOSIS — Z436 Encounter for attention to other artificial openings of urinary tract: Secondary | ICD-10-CM | POA: Diagnosis not present

## 2013-06-19 DIAGNOSIS — I10 Essential (primary) hypertension: Secondary | ICD-10-CM | POA: Diagnosis not present

## 2013-06-19 DIAGNOSIS — Z48816 Encounter for surgical aftercare following surgery on the genitourinary system: Secondary | ICD-10-CM | POA: Diagnosis not present

## 2013-06-20 DIAGNOSIS — E119 Type 2 diabetes mellitus without complications: Secondary | ICD-10-CM | POA: Diagnosis not present

## 2013-06-20 DIAGNOSIS — N3010 Interstitial cystitis (chronic) without hematuria: Secondary | ICD-10-CM | POA: Diagnosis not present

## 2013-06-20 DIAGNOSIS — Z452 Encounter for adjustment and management of vascular access device: Secondary | ICD-10-CM | POA: Diagnosis not present

## 2013-06-20 DIAGNOSIS — Z436 Encounter for attention to other artificial openings of urinary tract: Secondary | ICD-10-CM | POA: Diagnosis not present

## 2013-06-20 DIAGNOSIS — I10 Essential (primary) hypertension: Secondary | ICD-10-CM | POA: Diagnosis not present

## 2013-06-20 DIAGNOSIS — Z48816 Encounter for surgical aftercare following surgery on the genitourinary system: Secondary | ICD-10-CM | POA: Diagnosis not present

## 2013-06-23 DIAGNOSIS — Z452 Encounter for adjustment and management of vascular access device: Secondary | ICD-10-CM | POA: Diagnosis not present

## 2013-06-23 DIAGNOSIS — Z48816 Encounter for surgical aftercare following surgery on the genitourinary system: Secondary | ICD-10-CM | POA: Diagnosis not present

## 2013-06-23 DIAGNOSIS — N3010 Interstitial cystitis (chronic) without hematuria: Secondary | ICD-10-CM | POA: Diagnosis not present

## 2013-06-23 DIAGNOSIS — Z436 Encounter for attention to other artificial openings of urinary tract: Secondary | ICD-10-CM | POA: Diagnosis not present

## 2013-06-23 DIAGNOSIS — E119 Type 2 diabetes mellitus without complications: Secondary | ICD-10-CM | POA: Diagnosis not present

## 2013-06-23 DIAGNOSIS — I10 Essential (primary) hypertension: Secondary | ICD-10-CM | POA: Diagnosis not present

## 2013-06-24 ENCOUNTER — Encounter: Payer: Self-pay | Admitting: Family Medicine

## 2013-06-25 DIAGNOSIS — I10 Essential (primary) hypertension: Secondary | ICD-10-CM | POA: Diagnosis not present

## 2013-06-25 DIAGNOSIS — Z436 Encounter for attention to other artificial openings of urinary tract: Secondary | ICD-10-CM | POA: Diagnosis not present

## 2013-06-25 DIAGNOSIS — N3010 Interstitial cystitis (chronic) without hematuria: Secondary | ICD-10-CM | POA: Diagnosis not present

## 2013-06-25 DIAGNOSIS — Z452 Encounter for adjustment and management of vascular access device: Secondary | ICD-10-CM | POA: Diagnosis not present

## 2013-06-25 DIAGNOSIS — Z48816 Encounter for surgical aftercare following surgery on the genitourinary system: Secondary | ICD-10-CM | POA: Diagnosis not present

## 2013-06-25 DIAGNOSIS — E119 Type 2 diabetes mellitus without complications: Secondary | ICD-10-CM | POA: Diagnosis not present

## 2013-06-26 DIAGNOSIS — I10 Essential (primary) hypertension: Secondary | ICD-10-CM | POA: Diagnosis not present

## 2013-06-26 DIAGNOSIS — Z48816 Encounter for surgical aftercare following surgery on the genitourinary system: Secondary | ICD-10-CM | POA: Diagnosis not present

## 2013-06-26 DIAGNOSIS — Z436 Encounter for attention to other artificial openings of urinary tract: Secondary | ICD-10-CM | POA: Diagnosis not present

## 2013-06-26 DIAGNOSIS — N3010 Interstitial cystitis (chronic) without hematuria: Secondary | ICD-10-CM | POA: Diagnosis not present

## 2013-06-26 DIAGNOSIS — Z452 Encounter for adjustment and management of vascular access device: Secondary | ICD-10-CM | POA: Diagnosis not present

## 2013-06-26 DIAGNOSIS — E119 Type 2 diabetes mellitus without complications: Secondary | ICD-10-CM | POA: Diagnosis not present

## 2013-06-27 DIAGNOSIS — I10 Essential (primary) hypertension: Secondary | ICD-10-CM | POA: Diagnosis not present

## 2013-06-27 DIAGNOSIS — Z48816 Encounter for surgical aftercare following surgery on the genitourinary system: Secondary | ICD-10-CM | POA: Diagnosis not present

## 2013-06-27 DIAGNOSIS — Z436 Encounter for attention to other artificial openings of urinary tract: Secondary | ICD-10-CM | POA: Diagnosis not present

## 2013-06-27 DIAGNOSIS — E119 Type 2 diabetes mellitus without complications: Secondary | ICD-10-CM | POA: Diagnosis not present

## 2013-06-27 DIAGNOSIS — N3010 Interstitial cystitis (chronic) without hematuria: Secondary | ICD-10-CM | POA: Diagnosis not present

## 2013-06-27 DIAGNOSIS — Z452 Encounter for adjustment and management of vascular access device: Secondary | ICD-10-CM | POA: Diagnosis not present

## 2013-06-30 DIAGNOSIS — D649 Anemia, unspecified: Secondary | ICD-10-CM | POA: Diagnosis not present

## 2013-06-30 DIAGNOSIS — R82998 Other abnormal findings in urine: Secondary | ICD-10-CM | POA: Diagnosis not present

## 2013-06-30 DIAGNOSIS — N178 Other acute kidney failure: Secondary | ICD-10-CM | POA: Diagnosis not present

## 2013-06-30 DIAGNOSIS — N181 Chronic kidney disease, stage 1: Secondary | ICD-10-CM | POA: Diagnosis not present

## 2013-06-30 DIAGNOSIS — G4733 Obstructive sleep apnea (adult) (pediatric): Secondary | ICD-10-CM | POA: Diagnosis present

## 2013-06-30 DIAGNOSIS — T1490XA Injury, unspecified, initial encounter: Secondary | ICD-10-CM | POA: Diagnosis not present

## 2013-06-30 DIAGNOSIS — N319 Neuromuscular dysfunction of bladder, unspecified: Secondary | ICD-10-CM | POA: Diagnosis present

## 2013-06-30 DIAGNOSIS — E874 Mixed disorder of acid-base balance: Secondary | ICD-10-CM | POA: Diagnosis present

## 2013-06-30 DIAGNOSIS — N179 Acute kidney failure, unspecified: Secondary | ICD-10-CM | POA: Diagnosis not present

## 2013-06-30 DIAGNOSIS — E1129 Type 2 diabetes mellitus with other diabetic kidney complication: Secondary | ICD-10-CM | POA: Diagnosis not present

## 2013-06-30 DIAGNOSIS — K8020 Calculus of gallbladder without cholecystitis without obstruction: Secondary | ICD-10-CM | POA: Diagnosis not present

## 2013-06-30 DIAGNOSIS — A409 Streptococcal sepsis, unspecified: Secondary | ICD-10-CM | POA: Diagnosis present

## 2013-06-30 DIAGNOSIS — E785 Hyperlipidemia, unspecified: Secondary | ICD-10-CM | POA: Diagnosis present

## 2013-06-30 DIAGNOSIS — E440 Moderate protein-calorie malnutrition: Secondary | ICD-10-CM | POA: Diagnosis not present

## 2013-06-30 DIAGNOSIS — E039 Hypothyroidism, unspecified: Secondary | ICD-10-CM | POA: Diagnosis present

## 2013-06-30 DIAGNOSIS — L02219 Cutaneous abscess of trunk, unspecified: Secondary | ICD-10-CM | POA: Diagnosis present

## 2013-06-30 DIAGNOSIS — J309 Allergic rhinitis, unspecified: Secondary | ICD-10-CM | POA: Diagnosis present

## 2013-06-30 DIAGNOSIS — I10 Essential (primary) hypertension: Secondary | ICD-10-CM | POA: Diagnosis not present

## 2013-06-30 DIAGNOSIS — T8140XA Infection following a procedure, unspecified, initial encounter: Secondary | ICD-10-CM | POA: Diagnosis present

## 2013-06-30 DIAGNOSIS — I739 Peripheral vascular disease, unspecified: Secondary | ICD-10-CM | POA: Diagnosis present

## 2013-06-30 DIAGNOSIS — I4891 Unspecified atrial fibrillation: Secondary | ICD-10-CM | POA: Diagnosis present

## 2013-06-30 DIAGNOSIS — N183 Chronic kidney disease, stage 3 unspecified: Secondary | ICD-10-CM | POA: Diagnosis present

## 2013-06-30 DIAGNOSIS — R0789 Other chest pain: Secondary | ICD-10-CM | POA: Diagnosis not present

## 2013-06-30 DIAGNOSIS — A419 Sepsis, unspecified organism: Secondary | ICD-10-CM | POA: Diagnosis present

## 2013-06-30 DIAGNOSIS — E872 Acidosis, unspecified: Secondary | ICD-10-CM | POA: Diagnosis not present

## 2013-06-30 DIAGNOSIS — N3289 Other specified disorders of bladder: Secondary | ICD-10-CM | POA: Diagnosis present

## 2013-06-30 DIAGNOSIS — L89109 Pressure ulcer of unspecified part of back, unspecified stage: Secondary | ICD-10-CM | POA: Diagnosis not present

## 2013-06-30 DIAGNOSIS — N139 Obstructive and reflux uropathy, unspecified: Secondary | ICD-10-CM | POA: Diagnosis not present

## 2013-06-30 DIAGNOSIS — N200 Calculus of kidney: Secondary | ICD-10-CM | POA: Diagnosis present

## 2013-06-30 DIAGNOSIS — R6520 Severe sepsis without septic shock: Secondary | ICD-10-CM | POA: Diagnosis not present

## 2013-06-30 DIAGNOSIS — A4151 Sepsis due to Escherichia coli [E. coli]: Secondary | ICD-10-CM | POA: Diagnosis present

## 2013-06-30 DIAGNOSIS — G92 Toxic encephalopathy: Secondary | ICD-10-CM | POA: Diagnosis not present

## 2013-06-30 DIAGNOSIS — A4189 Other specified sepsis: Secondary | ICD-10-CM | POA: Diagnosis not present

## 2013-06-30 DIAGNOSIS — T148XXA Other injury of unspecified body region, initial encounter: Secondary | ICD-10-CM | POA: Diagnosis not present

## 2013-06-30 DIAGNOSIS — N170 Acute kidney failure with tubular necrosis: Secondary | ICD-10-CM | POA: Diagnosis present

## 2013-06-30 DIAGNOSIS — D508 Other iron deficiency anemias: Secondary | ICD-10-CM | POA: Diagnosis not present

## 2013-06-30 DIAGNOSIS — G929 Unspecified toxic encephalopathy: Secondary | ICD-10-CM | POA: Diagnosis present

## 2013-06-30 DIAGNOSIS — S88119A Complete traumatic amputation at level between knee and ankle, unspecified lower leg, initial encounter: Secondary | ICD-10-CM | POA: Diagnosis not present

## 2013-06-30 DIAGNOSIS — D696 Thrombocytopenia, unspecified: Secondary | ICD-10-CM | POA: Diagnosis not present

## 2013-06-30 DIAGNOSIS — I129 Hypertensive chronic kidney disease with stage 1 through stage 4 chronic kidney disease, or unspecified chronic kidney disease: Secondary | ICD-10-CM | POA: Diagnosis present

## 2013-06-30 DIAGNOSIS — N3010 Interstitial cystitis (chronic) without hematuria: Secondary | ICD-10-CM | POA: Diagnosis present

## 2013-06-30 DIAGNOSIS — N186 End stage renal disease: Secondary | ICD-10-CM | POA: Diagnosis not present

## 2013-06-30 DIAGNOSIS — R079 Chest pain, unspecified: Secondary | ICD-10-CM | POA: Diagnosis not present

## 2013-06-30 DIAGNOSIS — E119 Type 2 diabetes mellitus without complications: Secondary | ICD-10-CM | POA: Diagnosis present

## 2013-07-01 DIAGNOSIS — N179 Acute kidney failure, unspecified: Secondary | ICD-10-CM | POA: Diagnosis not present

## 2013-07-01 DIAGNOSIS — N181 Chronic kidney disease, stage 1: Secondary | ICD-10-CM | POA: Diagnosis not present

## 2013-07-01 DIAGNOSIS — T148XXA Other injury of unspecified body region, initial encounter: Secondary | ICD-10-CM | POA: Diagnosis not present

## 2013-07-01 DIAGNOSIS — E872 Acidosis, unspecified: Secondary | ICD-10-CM | POA: Diagnosis not present

## 2013-07-03 DIAGNOSIS — E872 Acidosis, unspecified: Secondary | ICD-10-CM | POA: Diagnosis not present

## 2013-07-03 DIAGNOSIS — I129 Hypertensive chronic kidney disease with stage 1 through stage 4 chronic kidney disease, or unspecified chronic kidney disease: Secondary | ICD-10-CM | POA: Diagnosis not present

## 2013-07-03 DIAGNOSIS — E039 Hypothyroidism, unspecified: Secondary | ICD-10-CM | POA: Diagnosis present

## 2013-07-03 DIAGNOSIS — B952 Enterococcus as the cause of diseases classified elsewhere: Secondary | ICD-10-CM | POA: Diagnosis not present

## 2013-07-03 DIAGNOSIS — S88119A Complete traumatic amputation at level between knee and ankle, unspecified lower leg, initial encounter: Secondary | ICD-10-CM | POA: Diagnosis not present

## 2013-07-03 DIAGNOSIS — Z792 Long term (current) use of antibiotics: Secondary | ICD-10-CM | POA: Diagnosis not present

## 2013-07-03 DIAGNOSIS — Z1639 Resistance to other specified antimicrobial drug: Secondary | ICD-10-CM | POA: Diagnosis not present

## 2013-07-03 DIAGNOSIS — I10 Essential (primary) hypertension: Secondary | ICD-10-CM | POA: Diagnosis not present

## 2013-07-03 DIAGNOSIS — D649 Anemia, unspecified: Secondary | ICD-10-CM | POA: Diagnosis not present

## 2013-07-03 DIAGNOSIS — E871 Hypo-osmolality and hyponatremia: Secondary | ICD-10-CM | POA: Diagnosis not present

## 2013-07-03 DIAGNOSIS — A498 Other bacterial infections of unspecified site: Secondary | ICD-10-CM | POA: Diagnosis not present

## 2013-07-03 DIAGNOSIS — R6520 Severe sepsis without septic shock: Secondary | ICD-10-CM | POA: Diagnosis present

## 2013-07-03 DIAGNOSIS — N183 Chronic kidney disease, stage 3 unspecified: Secondary | ICD-10-CM | POA: Diagnosis not present

## 2013-07-03 DIAGNOSIS — N3010 Interstitial cystitis (chronic) without hematuria: Secondary | ICD-10-CM | POA: Diagnosis not present

## 2013-07-03 DIAGNOSIS — T8189XA Other complications of procedures, not elsewhere classified, initial encounter: Secondary | ICD-10-CM | POA: Diagnosis present

## 2013-07-03 DIAGNOSIS — D696 Thrombocytopenia, unspecified: Secondary | ICD-10-CM | POA: Diagnosis present

## 2013-07-03 DIAGNOSIS — Z48816 Encounter for surgical aftercare following surgery on the genitourinary system: Secondary | ICD-10-CM | POA: Diagnosis not present

## 2013-07-03 DIAGNOSIS — Z452 Encounter for adjustment and management of vascular access device: Secondary | ICD-10-CM | POA: Diagnosis not present

## 2013-07-03 DIAGNOSIS — Z936 Other artificial openings of urinary tract status: Secondary | ICD-10-CM | POA: Diagnosis not present

## 2013-07-03 DIAGNOSIS — L8992 Pressure ulcer of unspecified site, stage 2: Secondary | ICD-10-CM | POA: Diagnosis present

## 2013-07-03 DIAGNOSIS — A4151 Sepsis due to Escherichia coli [E. coli]: Secondary | ICD-10-CM | POA: Diagnosis present

## 2013-07-03 DIAGNOSIS — L89109 Pressure ulcer of unspecified part of back, unspecified stage: Secondary | ICD-10-CM | POA: Diagnosis present

## 2013-07-03 DIAGNOSIS — N289 Disorder of kidney and ureter, unspecified: Secondary | ICD-10-CM | POA: Diagnosis present

## 2013-07-03 DIAGNOSIS — G4733 Obstructive sleep apnea (adult) (pediatric): Secondary | ICD-10-CM | POA: Diagnosis present

## 2013-07-03 DIAGNOSIS — A419 Sepsis, unspecified organism: Secondary | ICD-10-CM | POA: Diagnosis present

## 2013-07-03 DIAGNOSIS — Z9889 Other specified postprocedural states: Secondary | ICD-10-CM | POA: Diagnosis not present

## 2013-07-03 DIAGNOSIS — E1129 Type 2 diabetes mellitus with other diabetic kidney complication: Secondary | ICD-10-CM | POA: Diagnosis present

## 2013-07-03 DIAGNOSIS — R7881 Bacteremia: Secondary | ICD-10-CM | POA: Diagnosis not present

## 2013-07-03 DIAGNOSIS — N179 Acute kidney failure, unspecified: Secondary | ICD-10-CM | POA: Diagnosis not present

## 2013-07-03 DIAGNOSIS — Z436 Encounter for attention to other artificial openings of urinary tract: Secondary | ICD-10-CM | POA: Diagnosis not present

## 2013-07-03 DIAGNOSIS — Z86711 Personal history of pulmonary embolism: Secondary | ICD-10-CM | POA: Diagnosis not present

## 2013-07-03 DIAGNOSIS — Z86718 Personal history of other venous thrombosis and embolism: Secondary | ICD-10-CM | POA: Diagnosis not present

## 2013-07-03 DIAGNOSIS — E119 Type 2 diabetes mellitus without complications: Secondary | ICD-10-CM | POA: Diagnosis not present

## 2013-07-03 DIAGNOSIS — Z6837 Body mass index (BMI) 37.0-37.9, adult: Secondary | ICD-10-CM | POA: Diagnosis not present

## 2013-07-03 DIAGNOSIS — Z7982 Long term (current) use of aspirin: Secondary | ICD-10-CM | POA: Diagnosis not present

## 2013-07-03 DIAGNOSIS — T8132XA Disruption of internal operation (surgical) wound, not elsewhere classified, initial encounter: Secondary | ICD-10-CM | POA: Diagnosis not present

## 2013-07-03 DIAGNOSIS — E440 Moderate protein-calorie malnutrition: Secondary | ICD-10-CM | POA: Diagnosis present

## 2013-07-11 ENCOUNTER — Encounter: Payer: Self-pay | Admitting: Physician Assistant

## 2013-07-11 DIAGNOSIS — I10 Essential (primary) hypertension: Secondary | ICD-10-CM | POA: Diagnosis not present

## 2013-07-11 DIAGNOSIS — E119 Type 2 diabetes mellitus without complications: Secondary | ICD-10-CM | POA: Diagnosis not present

## 2013-07-11 DIAGNOSIS — N3010 Interstitial cystitis (chronic) without hematuria: Secondary | ICD-10-CM | POA: Diagnosis not present

## 2013-07-11 DIAGNOSIS — Z452 Encounter for adjustment and management of vascular access device: Secondary | ICD-10-CM | POA: Diagnosis not present

## 2013-07-11 DIAGNOSIS — Z48816 Encounter for surgical aftercare following surgery on the genitourinary system: Secondary | ICD-10-CM | POA: Diagnosis not present

## 2013-07-11 DIAGNOSIS — Z436 Encounter for attention to other artificial openings of urinary tract: Secondary | ICD-10-CM | POA: Diagnosis not present

## 2013-07-13 DIAGNOSIS — Z452 Encounter for adjustment and management of vascular access device: Secondary | ICD-10-CM | POA: Diagnosis not present

## 2013-07-13 DIAGNOSIS — E119 Type 2 diabetes mellitus without complications: Secondary | ICD-10-CM | POA: Diagnosis not present

## 2013-07-13 DIAGNOSIS — N3010 Interstitial cystitis (chronic) without hematuria: Secondary | ICD-10-CM | POA: Diagnosis not present

## 2013-07-13 DIAGNOSIS — I10 Essential (primary) hypertension: Secondary | ICD-10-CM | POA: Diagnosis not present

## 2013-07-13 DIAGNOSIS — Z48816 Encounter for surgical aftercare following surgery on the genitourinary system: Secondary | ICD-10-CM | POA: Diagnosis not present

## 2013-07-13 DIAGNOSIS — Z436 Encounter for attention to other artificial openings of urinary tract: Secondary | ICD-10-CM | POA: Diagnosis not present

## 2013-07-14 DIAGNOSIS — E119 Type 2 diabetes mellitus without complications: Secondary | ICD-10-CM | POA: Diagnosis not present

## 2013-07-14 DIAGNOSIS — N3010 Interstitial cystitis (chronic) without hematuria: Secondary | ICD-10-CM | POA: Diagnosis not present

## 2013-07-14 DIAGNOSIS — I10 Essential (primary) hypertension: Secondary | ICD-10-CM | POA: Diagnosis not present

## 2013-07-14 DIAGNOSIS — Z452 Encounter for adjustment and management of vascular access device: Secondary | ICD-10-CM | POA: Diagnosis not present

## 2013-07-14 DIAGNOSIS — Z436 Encounter for attention to other artificial openings of urinary tract: Secondary | ICD-10-CM | POA: Diagnosis not present

## 2013-07-14 DIAGNOSIS — Z48816 Encounter for surgical aftercare following surgery on the genitourinary system: Secondary | ICD-10-CM | POA: Diagnosis not present

## 2013-07-15 DIAGNOSIS — D649 Anemia, unspecified: Secondary | ICD-10-CM | POA: Diagnosis not present

## 2013-07-15 DIAGNOSIS — I10 Essential (primary) hypertension: Secondary | ICD-10-CM | POA: Diagnosis not present

## 2013-07-15 DIAGNOSIS — N390 Urinary tract infection, site not specified: Secondary | ICD-10-CM | POA: Diagnosis not present

## 2013-07-15 DIAGNOSIS — N183 Chronic kidney disease, stage 3 unspecified: Secondary | ICD-10-CM | POA: Diagnosis not present

## 2013-07-16 DIAGNOSIS — I10 Essential (primary) hypertension: Secondary | ICD-10-CM | POA: Diagnosis not present

## 2013-07-16 DIAGNOSIS — Z452 Encounter for adjustment and management of vascular access device: Secondary | ICD-10-CM | POA: Diagnosis not present

## 2013-07-16 DIAGNOSIS — N3010 Interstitial cystitis (chronic) without hematuria: Secondary | ICD-10-CM | POA: Diagnosis not present

## 2013-07-16 DIAGNOSIS — N184 Chronic kidney disease, stage 4 (severe): Secondary | ICD-10-CM | POA: Diagnosis not present

## 2013-07-16 DIAGNOSIS — Z48816 Encounter for surgical aftercare following surgery on the genitourinary system: Secondary | ICD-10-CM | POA: Diagnosis not present

## 2013-07-16 DIAGNOSIS — Z436 Encounter for attention to other artificial openings of urinary tract: Secondary | ICD-10-CM | POA: Diagnosis not present

## 2013-07-16 DIAGNOSIS — E119 Type 2 diabetes mellitus without complications: Secondary | ICD-10-CM | POA: Diagnosis not present

## 2013-07-16 DIAGNOSIS — D649 Anemia, unspecified: Secondary | ICD-10-CM | POA: Diagnosis not present

## 2013-07-17 ENCOUNTER — Encounter: Payer: Self-pay | Admitting: Physician Assistant

## 2013-07-17 ENCOUNTER — Other Ambulatory Visit: Payer: Self-pay | Admitting: Physician Assistant

## 2013-07-17 ENCOUNTER — Ambulatory Visit (INDEPENDENT_AMBULATORY_CARE_PROVIDER_SITE_OTHER): Payer: Medicare Other | Admitting: Physician Assistant

## 2013-07-17 VITALS — BP 105/67 | HR 85 | Temp 98.1°F | Resp 18 | Ht 71.0 in

## 2013-07-17 DIAGNOSIS — N179 Acute kidney failure, unspecified: Secondary | ICD-10-CM

## 2013-07-17 DIAGNOSIS — Z7901 Long term (current) use of anticoagulants: Secondary | ICD-10-CM | POA: Diagnosis not present

## 2013-07-17 DIAGNOSIS — Z452 Encounter for adjustment and management of vascular access device: Secondary | ICD-10-CM | POA: Diagnosis not present

## 2013-07-17 DIAGNOSIS — R6520 Severe sepsis without septic shock: Secondary | ICD-10-CM

## 2013-07-17 DIAGNOSIS — Z436 Encounter for attention to other artificial openings of urinary tract: Secondary | ICD-10-CM | POA: Diagnosis not present

## 2013-07-17 DIAGNOSIS — S88119A Complete traumatic amputation at level between knee and ankle, unspecified lower leg, initial encounter: Secondary | ICD-10-CM | POA: Diagnosis not present

## 2013-07-17 DIAGNOSIS — Z5181 Encounter for therapeutic drug level monitoring: Secondary | ICD-10-CM | POA: Diagnosis not present

## 2013-07-17 DIAGNOSIS — Z794 Long term (current) use of insulin: Secondary | ICD-10-CM | POA: Diagnosis not present

## 2013-07-17 DIAGNOSIS — I10 Essential (primary) hypertension: Secondary | ICD-10-CM | POA: Diagnosis not present

## 2013-07-17 DIAGNOSIS — N189 Chronic kidney disease, unspecified: Secondary | ICD-10-CM

## 2013-07-17 DIAGNOSIS — I129 Hypertensive chronic kidney disease with stage 1 through stage 4 chronic kidney disease, or unspecified chronic kidney disease: Secondary | ICD-10-CM | POA: Diagnosis not present

## 2013-07-17 DIAGNOSIS — R7881 Bacteremia: Secondary | ICD-10-CM

## 2013-07-17 DIAGNOSIS — R6521 Severe sepsis with septic shock: Principal | ICD-10-CM

## 2013-07-17 DIAGNOSIS — A419 Sepsis, unspecified organism: Secondary | ICD-10-CM

## 2013-07-17 DIAGNOSIS — N184 Chronic kidney disease, stage 4 (severe): Secondary | ICD-10-CM | POA: Diagnosis not present

## 2013-07-17 DIAGNOSIS — E119 Type 2 diabetes mellitus without complications: Secondary | ICD-10-CM | POA: Diagnosis not present

## 2013-07-17 DIAGNOSIS — N3010 Interstitial cystitis (chronic) without hematuria: Secondary | ICD-10-CM | POA: Diagnosis not present

## 2013-07-17 DIAGNOSIS — D649 Anemia, unspecified: Secondary | ICD-10-CM | POA: Diagnosis not present

## 2013-07-17 DIAGNOSIS — Z48816 Encounter for surgical aftercare following surgery on the genitourinary system: Secondary | ICD-10-CM | POA: Diagnosis not present

## 2013-07-17 LAB — CBC WITH DIFFERENTIAL/PLATELET
Basophils Absolute: 0.1 10*3/uL (ref 0.0–0.1)
Basophils Relative: 1 % (ref 0–1)
Eosinophils Absolute: 0.1 10*3/uL (ref 0.0–0.7)
Eosinophils Relative: 1 % (ref 0–5)
HEMATOCRIT: 27.6 % — AB (ref 39.0–52.0)
Hemoglobin: 9.2 g/dL — ABNORMAL LOW (ref 13.0–17.0)
LYMPHS PCT: 11 % — AB (ref 12–46)
Lymphs Abs: 0.6 10*3/uL — ABNORMAL LOW (ref 0.7–4.0)
MCH: 28.7 pg (ref 26.0–34.0)
MCHC: 33.3 g/dL (ref 30.0–36.0)
MCV: 86 fL (ref 78.0–100.0)
MONO ABS: 0.5 10*3/uL (ref 0.1–1.0)
MONOS PCT: 9 % (ref 3–12)
NEUTROS ABS: 4.3 10*3/uL (ref 1.7–7.7)
Neutrophils Relative %: 78 % — ABNORMAL HIGH (ref 43–77)
Platelets: 236 10*3/uL (ref 150–400)
RBC: 3.21 MIL/uL — ABNORMAL LOW (ref 4.22–5.81)
RDW: 16.5 % — ABNORMAL HIGH (ref 11.5–15.5)
WBC: 5.5 10*3/uL (ref 4.0–10.5)

## 2013-07-17 NOTE — Progress Notes (Signed)
Pre visit review using our clinic review tool, if applicable. No additional management support is needed unless otherwise documented below in the visit note/SLS  

## 2013-07-17 NOTE — Progress Notes (Signed)
Patient presents to clinic today for hospital follow-up. Patient was in [facility] facility for sepsis stemming from surgical wound at urostomy site.  Patient is s/p Urostomy placement.  Patient had IJ perma-cath placement. Was given IV antibiotics.  Kept until blood cultures were unremarkable.  Patient's kidney function was found to be at ESRD level.  Patient was given hemodialysis inpatient and instructed to follow-up with Neprhology.  Since discharge patient states he has been doing well. Denies swelling, redness, warmth, or drainage from site. Patient is in the process of having dialysis set up 3 times per week.  Waiting on approval from insurance.  Patient is taking medications as directed, except for Coumadin.  Was taken off for initial surgery 3 months ago and has remained off medication per his specialists due to multiple surgeries.  Patient refuses to restart medication against medical advice. Patient with history of DVT and PE.  Patient also an amputee having left-lower BKA.  Patient continues to have Home Health come several times per week to monitor for wounds of lower extremities.   Past Medical History  Diagnosis Date  . Sleep apnea     uses cpap  . Hypothyroidism   . Anemia   . Blood transfusion   . Chronic kidney disease     hx of kidney stones  . Hypertension   . Arthritis     osteoarthritis  . Pneumonia   . Diabetes mellitus     insulin dependent  . Peripheral vascular disease   . Hyperlipidemia   . Morbid obesity   . Hypertrophy of prostate   . Acute respiratory failure   . Cellulitis and abscess of leg   . Chronic kidney disease, stage III (moderate)   . Nephrolithiasis   . Anxiety   . Chronic pain   . Embolism and thrombosis of unspecified artery   . DVT (deep venous thrombosis)     Lower Extremity  . Personal history of arthritis     Osteoarthritis  . History of nephrolithiasis     Bilateral  . AR (allergic rhinitis)   . BPH (benign prostatic hypertrophy)   .  Displacement of lumbar intervertebral disc without myelopathy     L4-L5  . Cholelithiasis   . Other lymphedema   . Unspecified septicemia   . Impotence of organic origin   . Pancreatitis, acute   . Diverticulosis of colon   . Gallstone     Current Outpatient Prescriptions on File Prior to Visit  Medication Sig Dispense Refill  . atorvastatin (LIPITOR) 40 MG tablet TAKE 1 TABLET EVERY DAY  30 tablet  2  . cyclobenzaprine (FLEXERIL) 5 MG tablet Take 5 mg by mouth 3 times/day as needed-between meals & bedtime.       . docusate sodium 100 MG CAPS Take 100 mg by mouth 2 (two) times daily.      . ferrous sulfate 325 (65 FE) MG tablet Take 325 mg by mouth 2 (two) times daily.       Marland Kitchen glucose blood (BAYER CONTOUR TEST) test strip Use as instructed to check blood sugar 3 - 4 times daily.  Dx 250.00  100 each  3  . HYDROcodone-acetaminophen (NORCO/VICODIN) 5-325 MG per tablet Take 1 tablet by mouth every 6 (six) hours as needed.  30 tablet  0  . insulin aspart (NOVOLOG) 100 UNIT/ML injection INJECT 3 UNITS THREE TIMES DAILY WITH MEALS. AND AT NIGHTIME IF BLOOD SUGARS GREATER THAN 150  20 mL  2  . insulin  glargine (LANTUS) 100 UNIT/ML injection PATIENT REPORTS THAT HE TAKES 40 UNITS DAILY.  40 mL  5  . Insulin Syringe-Needle U-100 (INSULIN SYRINGE .5CC/31GX5/16") 31G X 5/16" 0.5 ML MISC USE AS DIRECTED TO INJECT INSULIN FOUR TIMES DAILY Dx: 250.00  120 each  5  . levothyroxine (SYNTHROID, LEVOTHROID) 200 MCG tablet TAKE 1 TABLET (200 MCG TOTAL) BY MOUTH EVERY MORNING.  30 tablet  2  . levothyroxine (SYNTHROID, LEVOTHROID) 25 MCG tablet TAKE 1 TABLET (25 MCG TOTAL) BY MOUTH DAILY BEFORE BREAKFAST.  30 tablet  1  . lidocaine (XYLOCAINE) 2 % jelly Apply topically every 8 (eight) hours as needed (urethral pain).  30 mL  0  . Multiple Vitamins-Minerals (MULTIVITAMINS THER. W/MINERALS) TABS Take 1 tablet by mouth daily.       . tamsulosin (FLOMAX) 0.4 MG CAPS capsule Take 0.4 mg by mouth at bedtime.        Marland Kitchen warfarin (COUMADIN) 2 MG tablet Take 1 tablet (2 mg total) by mouth daily at 6 PM.  30 tablet  2   No current facility-administered medications on file prior to visit.    No Known Allergies  Family History  Problem Relation Age of Onset  . Diabetes Mother   . Heart attack Mother   . Stroke Mother   . Colon cancer Father   . Dementia Father   . Heart attack Father   . Arthritis Paternal Grandmother   . Hypertension Other   . Hypothyroidism Other     History   Social History  . Marital Status: Married    Spouse Name: N/A    Number of Children: N/A  . Years of Education: N/A   Social History Main Topics  . Smoking status: Never Smoker   . Smokeless tobacco: Never Used  . Alcohol Use: No  . Drug Use: No  . Sexual Activity: Yes   Other Topics Concern  . None   Social History Narrative   Lives with his wife and uses a wheelchair for transfers.     Review of Systems - See HPI.  All other ROS are negative.  BP 105/67  Pulse 85  Temp(Src) 98.1 F (36.7 C) (Oral)  Resp 18  Ht 5\' 11"  (1.803 m)  SpO2 97%  Physical Exam  Vitals reviewed. Constitutional: He is oriented to person, place, and time and well-developed, well-nourished, and in no distress.  HENT:  Head: Normocephalic and atraumatic.  Eyes: Conjunctivae are normal.  Neck: Neck supple.  Cardiovascular: Normal rate, regular rhythm, normal heart sounds and intact distal pulses.   Pulmonary/Chest: Effort normal and breath sounds normal. No respiratory distress. He has no wheezes. He has no rales. He exhibits no tenderness.  Abdominal: Soft. Bowel sounds are normal. He exhibits no distension.  Urostomy in place without leakage or evidence of infection.  Good output as noted by full Urostomy back.  Musculoskeletal:  Left BKA noted.  Patient with chronic venous stasis dermatitis bilaterally without active ulceration. Some mild weeping noted.    Neurological: He is alert and oriented to person, place, and time.   Psychiatric: Affect normal.   Assessment/Plan: On anticoagulant therapy Patient refuses to restart Coumadin therapy despite being AMA.  Patient with remote history of DVT and PE. No hx of a. Fib.  Patient was very noncompliant with medication and PT/INR checks.  Risk of bleeding outweighs benefit. Will hold off on anticoagulant.  Septic shock Resolved. [facility] cultures [facility] were negative at discharge.  Will obtain labs.  Renal failure, acute on chronic Patient at ESRD.  BUN 80 and Cr 5 at nephrology appointment yesterday.  Patient is set up for HD 3 x week. Good Urostomy output. Patient to follow-up with Nephrology and Urology.

## 2013-07-17 NOTE — Patient Instructions (Signed)
Please obtain labs. I will call you with your results.  Continue current medications.  Please reconsider letting us restart your coumadin.  Your risk for clot is increased substantially.  Please follow-up with specialists.  I will be in contact with [facility] with [facility].

## 2013-07-18 ENCOUNTER — Telehealth: Payer: Self-pay | Admitting: *Deleted

## 2013-07-18 DIAGNOSIS — Z48816 Encounter for surgical aftercare following surgery on the genitourinary system: Secondary | ICD-10-CM | POA: Diagnosis not present

## 2013-07-18 DIAGNOSIS — N189 Chronic kidney disease, unspecified: Secondary | ICD-10-CM | POA: Diagnosis not present

## 2013-07-18 DIAGNOSIS — I129 Hypertensive chronic kidney disease with stage 1 through stage 4 chronic kidney disease, or unspecified chronic kidney disease: Secondary | ICD-10-CM | POA: Diagnosis not present

## 2013-07-18 DIAGNOSIS — Z452 Encounter for adjustment and management of vascular access device: Secondary | ICD-10-CM | POA: Diagnosis not present

## 2013-07-18 DIAGNOSIS — E119 Type 2 diabetes mellitus without complications: Secondary | ICD-10-CM | POA: Diagnosis not present

## 2013-07-18 DIAGNOSIS — N3010 Interstitial cystitis (chronic) without hematuria: Secondary | ICD-10-CM | POA: Diagnosis not present

## 2013-07-18 LAB — URINALYSIS, ROUTINE W REFLEX MICROSCOPIC
BILIRUBIN URINE: NEGATIVE
GLUCOSE, UA: NEGATIVE mg/dL
Ketones, ur: NEGATIVE mg/dL
Nitrite: NEGATIVE
Protein, ur: 100 mg/dL — AB
Specific Gravity, Urine: 1.011 (ref 1.005–1.030)
Urobilinogen, UA: 0.2 mg/dL (ref 0.0–1.0)
pH: 7 (ref 5.0–8.0)

## 2013-07-18 LAB — URINALYSIS, MICROSCOPIC ONLY
Bacteria, UA: NONE SEEN
CASTS: NONE SEEN
Crystals: NONE SEEN
Squamous Epithelial / LPF: NONE SEEN

## 2013-07-18 LAB — COMPREHENSIVE METABOLIC PANEL
ALBUMIN: 2.6 g/dL — AB (ref 3.5–5.2)
ALT: 8 U/L (ref 0–53)
AST: 7 U/L (ref 0–37)
Alkaline Phosphatase: 84 U/L (ref 39–117)
BUN: 79 mg/dL — ABNORMAL HIGH (ref 6–23)
CALCIUM: 8.2 mg/dL — AB (ref 8.4–10.5)
CHLORIDE: 104 meq/L (ref 96–112)
CO2: 15 meq/L — AB (ref 19–32)
CREATININE: 5.73 mg/dL — AB (ref 0.50–1.35)
GLUCOSE: 166 mg/dL — AB (ref 70–99)
POTASSIUM: 4.7 meq/L (ref 3.5–5.3)
Sodium: 134 mEq/L — ABNORMAL LOW (ref 135–145)
Total Bilirubin: 0.4 mg/dL (ref 0.2–1.2)
Total Protein: 6.3 g/dL (ref 6.0–8.3)

## 2013-07-18 NOTE — Telephone Encounter (Signed)
Notified pt. He requests that we contact [facility], [phone] [name] Apple, RN) and see if they will draw repeat CMP. Will call them next week to arrange.

## 2013-07-18 NOTE — Telephone Encounter (Signed)
Message copied by Ronny Flurry on Fri Jul 18, 2013  5:08 PM ------      Message from: Raiford Noble      Created: Fri Jul 18, 2013 10:14 AM       Labs stable overall.  His CO2 level was a little low, but I feel this is a lab error as he has not had respiratory issues.  Would like for him to return to lab in 1 week for repeat CMP.  He needs to follow-up with Nephrology and Urology.  Also urine with abnormalities. I am sending urine for culture to r/o infection.  Will call when results are in. ------

## 2013-07-19 LAB — URINE CULTURE
COLONY COUNT: NO GROWTH
ORGANISM ID, BACTERIA: NO GROWTH

## 2013-07-21 DIAGNOSIS — E119 Type 2 diabetes mellitus without complications: Secondary | ICD-10-CM | POA: Diagnosis not present

## 2013-07-21 DIAGNOSIS — N3010 Interstitial cystitis (chronic) without hematuria: Secondary | ICD-10-CM | POA: Diagnosis not present

## 2013-07-21 DIAGNOSIS — N189 Chronic kidney disease, unspecified: Secondary | ICD-10-CM | POA: Diagnosis not present

## 2013-07-21 DIAGNOSIS — I129 Hypertensive chronic kidney disease with stage 1 through stage 4 chronic kidney disease, or unspecified chronic kidney disease: Secondary | ICD-10-CM | POA: Diagnosis not present

## 2013-07-21 DIAGNOSIS — Z452 Encounter for adjustment and management of vascular access device: Secondary | ICD-10-CM | POA: Diagnosis not present

## 2013-07-21 DIAGNOSIS — Z48816 Encounter for surgical aftercare following surgery on the genitourinary system: Secondary | ICD-10-CM | POA: Diagnosis not present

## 2013-07-22 DIAGNOSIS — N3010 Interstitial cystitis (chronic) without hematuria: Secondary | ICD-10-CM | POA: Diagnosis not present

## 2013-07-22 DIAGNOSIS — N189 Chronic kidney disease, unspecified: Secondary | ICD-10-CM | POA: Diagnosis not present

## 2013-07-22 DIAGNOSIS — Z48816 Encounter for surgical aftercare following surgery on the genitourinary system: Secondary | ICD-10-CM | POA: Diagnosis not present

## 2013-07-22 DIAGNOSIS — I129 Hypertensive chronic kidney disease with stage 1 through stage 4 chronic kidney disease, or unspecified chronic kidney disease: Secondary | ICD-10-CM | POA: Diagnosis not present

## 2013-07-22 DIAGNOSIS — Z452 Encounter for adjustment and management of vascular access device: Secondary | ICD-10-CM | POA: Diagnosis not present

## 2013-07-22 DIAGNOSIS — E119 Type 2 diabetes mellitus without complications: Secondary | ICD-10-CM | POA: Diagnosis not present

## 2013-07-22 NOTE — Telephone Encounter (Signed)
Left message on Lisa's voicemail to return my call re: below request.

## 2013-07-22 NOTE — Telephone Encounter (Signed)
Spoke with [name], she will see pt Thursday and draw CMP at that time. Left message for pt to return my call.

## 2013-07-23 DIAGNOSIS — Z48816 Encounter for surgical aftercare following surgery on the genitourinary system: Secondary | ICD-10-CM | POA: Diagnosis not present

## 2013-07-23 DIAGNOSIS — Z452 Encounter for adjustment and management of vascular access device: Secondary | ICD-10-CM | POA: Diagnosis not present

## 2013-07-23 DIAGNOSIS — D631 Anemia in chronic kidney disease: Secondary | ICD-10-CM | POA: Diagnosis not present

## 2013-07-23 DIAGNOSIS — N3010 Interstitial cystitis (chronic) without hematuria: Secondary | ICD-10-CM | POA: Diagnosis not present

## 2013-07-23 DIAGNOSIS — I10 Essential (primary) hypertension: Secondary | ICD-10-CM | POA: Diagnosis not present

## 2013-07-23 DIAGNOSIS — N185 Chronic kidney disease, stage 5: Secondary | ICD-10-CM | POA: Diagnosis not present

## 2013-07-23 DIAGNOSIS — N039 Chronic nephritic syndrome with unspecified morphologic changes: Secondary | ICD-10-CM | POA: Diagnosis not present

## 2013-07-23 DIAGNOSIS — E119 Type 2 diabetes mellitus without complications: Secondary | ICD-10-CM | POA: Diagnosis not present

## 2013-07-23 DIAGNOSIS — N189 Chronic kidney disease, unspecified: Secondary | ICD-10-CM | POA: Diagnosis not present

## 2013-07-23 DIAGNOSIS — I129 Hypertensive chronic kidney disease with stage 1 through stage 4 chronic kidney disease, or unspecified chronic kidney disease: Secondary | ICD-10-CM | POA: Diagnosis not present

## 2013-07-24 DIAGNOSIS — N3010 Interstitial cystitis (chronic) without hematuria: Secondary | ICD-10-CM | POA: Diagnosis not present

## 2013-07-24 DIAGNOSIS — I129 Hypertensive chronic kidney disease with stage 1 through stage 4 chronic kidney disease, or unspecified chronic kidney disease: Secondary | ICD-10-CM | POA: Diagnosis not present

## 2013-07-24 DIAGNOSIS — E119 Type 2 diabetes mellitus without complications: Secondary | ICD-10-CM | POA: Diagnosis not present

## 2013-07-24 DIAGNOSIS — Z48816 Encounter for surgical aftercare following surgery on the genitourinary system: Secondary | ICD-10-CM | POA: Diagnosis not present

## 2013-07-24 DIAGNOSIS — Z452 Encounter for adjustment and management of vascular access device: Secondary | ICD-10-CM | POA: Diagnosis not present

## 2013-07-24 DIAGNOSIS — N189 Chronic kidney disease, unspecified: Secondary | ICD-10-CM | POA: Diagnosis not present

## 2013-07-26 DIAGNOSIS — N2581 Secondary hyperparathyroidism of renal origin: Secondary | ICD-10-CM | POA: Diagnosis not present

## 2013-07-26 DIAGNOSIS — E039 Hypothyroidism, unspecified: Secondary | ICD-10-CM | POA: Diagnosis not present

## 2013-07-26 DIAGNOSIS — D631 Anemia in chronic kidney disease: Secondary | ICD-10-CM | POA: Diagnosis not present

## 2013-07-26 DIAGNOSIS — N186 End stage renal disease: Secondary | ICD-10-CM | POA: Diagnosis not present

## 2013-07-28 DIAGNOSIS — Z48816 Encounter for surgical aftercare following surgery on the genitourinary system: Secondary | ICD-10-CM | POA: Diagnosis not present

## 2013-07-28 DIAGNOSIS — I10 Essential (primary) hypertension: Secondary | ICD-10-CM | POA: Diagnosis not present

## 2013-07-28 DIAGNOSIS — E119 Type 2 diabetes mellitus without complications: Secondary | ICD-10-CM | POA: Diagnosis not present

## 2013-07-28 DIAGNOSIS — N186 End stage renal disease: Secondary | ICD-10-CM | POA: Diagnosis not present

## 2013-07-28 DIAGNOSIS — N3010 Interstitial cystitis (chronic) without hematuria: Secondary | ICD-10-CM | POA: Diagnosis not present

## 2013-07-28 DIAGNOSIS — Z452 Encounter for adjustment and management of vascular access device: Secondary | ICD-10-CM | POA: Diagnosis not present

## 2013-07-28 DIAGNOSIS — I129 Hypertensive chronic kidney disease with stage 1 through stage 4 chronic kidney disease, or unspecified chronic kidney disease: Secondary | ICD-10-CM | POA: Diagnosis not present

## 2013-07-28 DIAGNOSIS — D649 Anemia, unspecified: Secondary | ICD-10-CM | POA: Diagnosis not present

## 2013-07-28 DIAGNOSIS — N189 Chronic kidney disease, unspecified: Secondary | ICD-10-CM | POA: Diagnosis not present

## 2013-07-29 DIAGNOSIS — T82598A Other mechanical complication of other cardiac and vascular devices and implants, initial encounter: Secondary | ICD-10-CM | POA: Diagnosis not present

## 2013-07-29 DIAGNOSIS — D631 Anemia in chronic kidney disease: Secondary | ICD-10-CM | POA: Diagnosis not present

## 2013-07-29 DIAGNOSIS — N186 End stage renal disease: Secondary | ICD-10-CM | POA: Diagnosis not present

## 2013-07-29 DIAGNOSIS — N2581 Secondary hyperparathyroidism of renal origin: Secondary | ICD-10-CM | POA: Diagnosis not present

## 2013-07-30 DIAGNOSIS — N189 Chronic kidney disease, unspecified: Secondary | ICD-10-CM | POA: Diagnosis not present

## 2013-07-30 DIAGNOSIS — N3010 Interstitial cystitis (chronic) without hematuria: Secondary | ICD-10-CM | POA: Diagnosis not present

## 2013-07-30 DIAGNOSIS — E119 Type 2 diabetes mellitus without complications: Secondary | ICD-10-CM | POA: Diagnosis not present

## 2013-07-30 DIAGNOSIS — I129 Hypertensive chronic kidney disease with stage 1 through stage 4 chronic kidney disease, or unspecified chronic kidney disease: Secondary | ICD-10-CM | POA: Diagnosis not present

## 2013-07-30 DIAGNOSIS — Z452 Encounter for adjustment and management of vascular access device: Secondary | ICD-10-CM | POA: Diagnosis not present

## 2013-07-30 DIAGNOSIS — Z48816 Encounter for surgical aftercare following surgery on the genitourinary system: Secondary | ICD-10-CM | POA: Diagnosis not present

## 2013-07-30 NOTE — Assessment & Plan Note (Signed)
Patient refuses to restart Coumadin therapy despite being AMA.  Patient with remote history of DVT and PE. No hx of a. Fib.  Patient was very noncompliant with medication and PT/INR checks.  Risk of bleeding outweighs benefit. Will hold off on anticoagulant.

## 2013-07-30 NOTE — Assessment & Plan Note (Signed)
Patient at ESRD.  BUN 80 and Cr 5 at nephrology appointment yesterday.  Patient is set up for HD 3 x week. Good Urostomy output. Patient to follow-up with Nephrology and Urology.

## 2013-07-30 NOTE — Assessment & Plan Note (Signed)
Resolved. Blood cultures at [facility] were negative at discharge.  Will obtain labs.

## 2013-07-31 DIAGNOSIS — T82598A Other mechanical complication of other cardiac and vascular devices and implants, initial encounter: Secondary | ICD-10-CM | POA: Diagnosis not present

## 2013-07-31 DIAGNOSIS — N186 End stage renal disease: Secondary | ICD-10-CM | POA: Diagnosis not present

## 2013-07-31 DIAGNOSIS — N2581 Secondary hyperparathyroidism of renal origin: Secondary | ICD-10-CM | POA: Diagnosis not present

## 2013-07-31 DIAGNOSIS — D631 Anemia in chronic kidney disease: Secondary | ICD-10-CM | POA: Diagnosis not present

## 2013-07-31 DIAGNOSIS — IMO0001 Reserved for inherently not codable concepts without codable children: Secondary | ICD-10-CM | POA: Diagnosis not present

## 2013-08-01 DIAGNOSIS — Z48816 Encounter for surgical aftercare following surgery on the genitourinary system: Secondary | ICD-10-CM | POA: Diagnosis not present

## 2013-08-01 DIAGNOSIS — Z452 Encounter for adjustment and management of vascular access device: Secondary | ICD-10-CM | POA: Diagnosis not present

## 2013-08-01 DIAGNOSIS — N189 Chronic kidney disease, unspecified: Secondary | ICD-10-CM | POA: Diagnosis not present

## 2013-08-01 DIAGNOSIS — I129 Hypertensive chronic kidney disease with stage 1 through stage 4 chronic kidney disease, or unspecified chronic kidney disease: Secondary | ICD-10-CM | POA: Diagnosis not present

## 2013-08-01 DIAGNOSIS — E119 Type 2 diabetes mellitus without complications: Secondary | ICD-10-CM | POA: Diagnosis not present

## 2013-08-01 DIAGNOSIS — N3010 Interstitial cystitis (chronic) without hematuria: Secondary | ICD-10-CM | POA: Diagnosis not present

## 2013-08-02 DIAGNOSIS — D631 Anemia in chronic kidney disease: Secondary | ICD-10-CM | POA: Diagnosis not present

## 2013-08-02 DIAGNOSIS — N186 End stage renal disease: Secondary | ICD-10-CM | POA: Diagnosis not present

## 2013-08-02 DIAGNOSIS — T82598A Other mechanical complication of other cardiac and vascular devices and implants, initial encounter: Secondary | ICD-10-CM | POA: Diagnosis not present

## 2013-08-02 DIAGNOSIS — N039 Chronic nephritic syndrome with unspecified morphologic changes: Secondary | ICD-10-CM | POA: Diagnosis not present

## 2013-08-02 DIAGNOSIS — N2581 Secondary hyperparathyroidism of renal origin: Secondary | ICD-10-CM | POA: Diagnosis not present

## 2013-08-04 ENCOUNTER — Encounter: Payer: Self-pay | Admitting: Physician Assistant

## 2013-08-04 DIAGNOSIS — E119 Type 2 diabetes mellitus without complications: Secondary | ICD-10-CM | POA: Diagnosis not present

## 2013-08-04 DIAGNOSIS — Z452 Encounter for adjustment and management of vascular access device: Secondary | ICD-10-CM | POA: Diagnosis not present

## 2013-08-04 DIAGNOSIS — I129 Hypertensive chronic kidney disease with stage 1 through stage 4 chronic kidney disease, or unspecified chronic kidney disease: Secondary | ICD-10-CM | POA: Diagnosis not present

## 2013-08-04 DIAGNOSIS — N189 Chronic kidney disease, unspecified: Secondary | ICD-10-CM | POA: Diagnosis not present

## 2013-08-04 DIAGNOSIS — Z48816 Encounter for surgical aftercare following surgery on the genitourinary system: Secondary | ICD-10-CM | POA: Diagnosis not present

## 2013-08-04 DIAGNOSIS — N3010 Interstitial cystitis (chronic) without hematuria: Secondary | ICD-10-CM | POA: Diagnosis not present

## 2013-08-05 DIAGNOSIS — N186 End stage renal disease: Secondary | ICD-10-CM | POA: Diagnosis not present

## 2013-08-05 DIAGNOSIS — T82598A Other mechanical complication of other cardiac and vascular devices and implants, initial encounter: Secondary | ICD-10-CM | POA: Diagnosis not present

## 2013-08-05 DIAGNOSIS — N2581 Secondary hyperparathyroidism of renal origin: Secondary | ICD-10-CM | POA: Diagnosis not present

## 2013-08-05 DIAGNOSIS — D631 Anemia in chronic kidney disease: Secondary | ICD-10-CM | POA: Diagnosis not present

## 2013-08-06 DIAGNOSIS — Z452 Encounter for adjustment and management of vascular access device: Secondary | ICD-10-CM | POA: Diagnosis not present

## 2013-08-06 DIAGNOSIS — Z48816 Encounter for surgical aftercare following surgery on the genitourinary system: Secondary | ICD-10-CM | POA: Diagnosis not present

## 2013-08-06 DIAGNOSIS — N189 Chronic kidney disease, unspecified: Secondary | ICD-10-CM | POA: Diagnosis not present

## 2013-08-06 DIAGNOSIS — I129 Hypertensive chronic kidney disease with stage 1 through stage 4 chronic kidney disease, or unspecified chronic kidney disease: Secondary | ICD-10-CM | POA: Diagnosis not present

## 2013-08-06 DIAGNOSIS — N3010 Interstitial cystitis (chronic) without hematuria: Secondary | ICD-10-CM | POA: Diagnosis not present

## 2013-08-06 DIAGNOSIS — E119 Type 2 diabetes mellitus without complications: Secondary | ICD-10-CM | POA: Diagnosis not present

## 2013-08-07 DIAGNOSIS — D631 Anemia in chronic kidney disease: Secondary | ICD-10-CM | POA: Diagnosis not present

## 2013-08-07 DIAGNOSIS — N2581 Secondary hyperparathyroidism of renal origin: Secondary | ICD-10-CM | POA: Diagnosis not present

## 2013-08-07 DIAGNOSIS — N186 End stage renal disease: Secondary | ICD-10-CM | POA: Diagnosis not present

## 2013-08-07 DIAGNOSIS — T82598A Other mechanical complication of other cardiac and vascular devices and implants, initial encounter: Secondary | ICD-10-CM | POA: Diagnosis not present

## 2013-08-08 DIAGNOSIS — Z48816 Encounter for surgical aftercare following surgery on the genitourinary system: Secondary | ICD-10-CM | POA: Diagnosis not present

## 2013-08-08 DIAGNOSIS — I129 Hypertensive chronic kidney disease with stage 1 through stage 4 chronic kidney disease, or unspecified chronic kidney disease: Secondary | ICD-10-CM | POA: Diagnosis not present

## 2013-08-08 DIAGNOSIS — E119 Type 2 diabetes mellitus without complications: Secondary | ICD-10-CM | POA: Diagnosis not present

## 2013-08-08 DIAGNOSIS — N3010 Interstitial cystitis (chronic) without hematuria: Secondary | ICD-10-CM | POA: Diagnosis not present

## 2013-08-08 DIAGNOSIS — N189 Chronic kidney disease, unspecified: Secondary | ICD-10-CM | POA: Diagnosis not present

## 2013-08-08 DIAGNOSIS — Z452 Encounter for adjustment and management of vascular access device: Secondary | ICD-10-CM | POA: Diagnosis not present

## 2013-08-09 ENCOUNTER — Other Ambulatory Visit: Payer: Self-pay | Admitting: Physician Assistant

## 2013-08-09 DIAGNOSIS — N186 End stage renal disease: Secondary | ICD-10-CM | POA: Diagnosis not present

## 2013-08-09 DIAGNOSIS — N2581 Secondary hyperparathyroidism of renal origin: Secondary | ICD-10-CM | POA: Diagnosis not present

## 2013-08-09 DIAGNOSIS — T82598A Other mechanical complication of other cardiac and vascular devices and implants, initial encounter: Secondary | ICD-10-CM | POA: Diagnosis not present

## 2013-08-09 DIAGNOSIS — D631 Anemia in chronic kidney disease: Secondary | ICD-10-CM | POA: Diagnosis not present

## 2013-08-11 DIAGNOSIS — Z452 Encounter for adjustment and management of vascular access device: Secondary | ICD-10-CM | POA: Diagnosis not present

## 2013-08-11 DIAGNOSIS — E119 Type 2 diabetes mellitus without complications: Secondary | ICD-10-CM | POA: Diagnosis not present

## 2013-08-11 DIAGNOSIS — I129 Hypertensive chronic kidney disease with stage 1 through stage 4 chronic kidney disease, or unspecified chronic kidney disease: Secondary | ICD-10-CM | POA: Diagnosis not present

## 2013-08-11 DIAGNOSIS — N189 Chronic kidney disease, unspecified: Secondary | ICD-10-CM | POA: Diagnosis not present

## 2013-08-11 DIAGNOSIS — Z992 Dependence on renal dialysis: Secondary | ICD-10-CM | POA: Diagnosis not present

## 2013-08-11 DIAGNOSIS — Z48816 Encounter for surgical aftercare following surgery on the genitourinary system: Secondary | ICD-10-CM | POA: Diagnosis not present

## 2013-08-11 DIAGNOSIS — N3010 Interstitial cystitis (chronic) without hematuria: Secondary | ICD-10-CM | POA: Diagnosis not present

## 2013-08-11 DIAGNOSIS — N186 End stage renal disease: Secondary | ICD-10-CM | POA: Diagnosis not present

## 2013-08-11 NOTE — Telephone Encounter (Signed)
Rx request to pharmacy/SLS  

## 2013-08-12 DIAGNOSIS — N2581 Secondary hyperparathyroidism of renal origin: Secondary | ICD-10-CM | POA: Diagnosis not present

## 2013-08-12 DIAGNOSIS — N039 Chronic nephritic syndrome with unspecified morphologic changes: Secondary | ICD-10-CM | POA: Diagnosis not present

## 2013-08-12 DIAGNOSIS — N186 End stage renal disease: Secondary | ICD-10-CM | POA: Diagnosis not present

## 2013-08-12 DIAGNOSIS — T82598A Other mechanical complication of other cardiac and vascular devices and implants, initial encounter: Secondary | ICD-10-CM | POA: Diagnosis not present

## 2013-08-12 DIAGNOSIS — D631 Anemia in chronic kidney disease: Secondary | ICD-10-CM | POA: Diagnosis not present

## 2013-08-13 DIAGNOSIS — Z452 Encounter for adjustment and management of vascular access device: Secondary | ICD-10-CM | POA: Diagnosis not present

## 2013-08-13 DIAGNOSIS — N189 Chronic kidney disease, unspecified: Secondary | ICD-10-CM | POA: Diagnosis not present

## 2013-08-13 DIAGNOSIS — N3010 Interstitial cystitis (chronic) without hematuria: Secondary | ICD-10-CM | POA: Diagnosis not present

## 2013-08-13 DIAGNOSIS — E119 Type 2 diabetes mellitus without complications: Secondary | ICD-10-CM | POA: Diagnosis not present

## 2013-08-13 DIAGNOSIS — I129 Hypertensive chronic kidney disease with stage 1 through stage 4 chronic kidney disease, or unspecified chronic kidney disease: Secondary | ICD-10-CM | POA: Diagnosis not present

## 2013-08-13 DIAGNOSIS — Z48816 Encounter for surgical aftercare following surgery on the genitourinary system: Secondary | ICD-10-CM | POA: Diagnosis not present

## 2013-08-14 ENCOUNTER — Telehealth: Payer: Self-pay | Admitting: *Deleted

## 2013-08-14 ENCOUNTER — Encounter: Payer: Self-pay | Admitting: Physician Assistant

## 2013-08-14 DIAGNOSIS — N186 End stage renal disease: Secondary | ICD-10-CM | POA: Diagnosis not present

## 2013-08-14 DIAGNOSIS — Z95828 Presence of other vascular implants and grafts: Secondary | ICD-10-CM

## 2013-08-14 DIAGNOSIS — N2581 Secondary hyperparathyroidism of renal origin: Secondary | ICD-10-CM | POA: Diagnosis not present

## 2013-08-14 DIAGNOSIS — D631 Anemia in chronic kidney disease: Secondary | ICD-10-CM | POA: Diagnosis not present

## 2013-08-14 DIAGNOSIS — Z7901 Long term (current) use of anticoagulants: Secondary | ICD-10-CM | POA: Diagnosis not present

## 2013-08-14 DIAGNOSIS — T82598A Other mechanical complication of other cardiac and vascular devices and implants, initial encounter: Secondary | ICD-10-CM | POA: Diagnosis not present

## 2013-08-14 NOTE — Telephone Encounter (Signed)
[name] w/[facility] sates pt has Hickman Catheter in Left Upper Chest that needs to be removed, placed by Urology [Dr. Amalia Hailey w/[facility]] prior to surgery and Urologist "declined to do anything about it and told her to call PCP about it"; caller states that patient needs referral to General Surgery to have catheter removed. Provider informed, per VO, placed AMB Referral to General Surgery for this reason; caller informed/SLS

## 2013-08-15 ENCOUNTER — Telehealth: Payer: Self-pay | Admitting: Physician Assistant

## 2013-08-15 DIAGNOSIS — Z452 Encounter for adjustment and management of vascular access device: Secondary | ICD-10-CM | POA: Diagnosis not present

## 2013-08-15 DIAGNOSIS — Z48816 Encounter for surgical aftercare following surgery on the genitourinary system: Secondary | ICD-10-CM | POA: Diagnosis not present

## 2013-08-15 DIAGNOSIS — N189 Chronic kidney disease, unspecified: Secondary | ICD-10-CM | POA: Diagnosis not present

## 2013-08-15 DIAGNOSIS — E119 Type 2 diabetes mellitus without complications: Secondary | ICD-10-CM | POA: Diagnosis not present

## 2013-08-15 DIAGNOSIS — N3010 Interstitial cystitis (chronic) without hematuria: Secondary | ICD-10-CM | POA: Diagnosis not present

## 2013-08-15 DIAGNOSIS — I129 Hypertensive chronic kidney disease with stage 1 through stage 4 chronic kidney disease, or unspecified chronic kidney disease: Secondary | ICD-10-CM | POA: Diagnosis not present

## 2013-08-15 DIAGNOSIS — T82514A Breakdown (mechanical) of infusion catheter, initial encounter: Secondary | ICD-10-CM

## 2013-08-15 NOTE — Telephone Encounter (Signed)
Referral placed to [facility] for Hickman catheter removal.

## 2013-08-16 DIAGNOSIS — D631 Anemia in chronic kidney disease: Secondary | ICD-10-CM | POA: Diagnosis not present

## 2013-08-16 DIAGNOSIS — N2581 Secondary hyperparathyroidism of renal origin: Secondary | ICD-10-CM | POA: Diagnosis not present

## 2013-08-16 DIAGNOSIS — N186 End stage renal disease: Secondary | ICD-10-CM | POA: Diagnosis not present

## 2013-08-16 DIAGNOSIS — T82598A Other mechanical complication of other cardiac and vascular devices and implants, initial encounter: Secondary | ICD-10-CM | POA: Diagnosis not present

## 2013-08-18 DIAGNOSIS — E119 Type 2 diabetes mellitus without complications: Secondary | ICD-10-CM | POA: Diagnosis not present

## 2013-08-18 DIAGNOSIS — N3010 Interstitial cystitis (chronic) without hematuria: Secondary | ICD-10-CM | POA: Diagnosis not present

## 2013-08-18 DIAGNOSIS — I129 Hypertensive chronic kidney disease with stage 1 through stage 4 chronic kidney disease, or unspecified chronic kidney disease: Secondary | ICD-10-CM | POA: Diagnosis not present

## 2013-08-18 DIAGNOSIS — Z452 Encounter for adjustment and management of vascular access device: Secondary | ICD-10-CM | POA: Diagnosis not present

## 2013-08-18 DIAGNOSIS — Z48816 Encounter for surgical aftercare following surgery on the genitourinary system: Secondary | ICD-10-CM | POA: Diagnosis not present

## 2013-08-18 DIAGNOSIS — N189 Chronic kidney disease, unspecified: Secondary | ICD-10-CM | POA: Diagnosis not present

## 2013-08-19 DIAGNOSIS — N186 End stage renal disease: Secondary | ICD-10-CM | POA: Diagnosis not present

## 2013-08-19 DIAGNOSIS — D631 Anemia in chronic kidney disease: Secondary | ICD-10-CM | POA: Diagnosis not present

## 2013-08-19 DIAGNOSIS — N2581 Secondary hyperparathyroidism of renal origin: Secondary | ICD-10-CM | POA: Diagnosis not present

## 2013-08-19 DIAGNOSIS — T82598A Other mechanical complication of other cardiac and vascular devices and implants, initial encounter: Secondary | ICD-10-CM | POA: Diagnosis not present

## 2013-08-20 DIAGNOSIS — E079 Disorder of thyroid, unspecified: Secondary | ICD-10-CM | POA: Diagnosis not present

## 2013-08-20 DIAGNOSIS — Z9889 Other specified postprocedural states: Secondary | ICD-10-CM | POA: Diagnosis not present

## 2013-08-20 DIAGNOSIS — N189 Chronic kidney disease, unspecified: Secondary | ICD-10-CM | POA: Diagnosis not present

## 2013-08-20 DIAGNOSIS — E785 Hyperlipidemia, unspecified: Secondary | ICD-10-CM | POA: Diagnosis not present

## 2013-08-20 DIAGNOSIS — Z794 Long term (current) use of insulin: Secondary | ICD-10-CM | POA: Diagnosis not present

## 2013-08-20 DIAGNOSIS — M1990 Unspecified osteoarthritis, unspecified site: Secondary | ICD-10-CM | POA: Diagnosis not present

## 2013-08-20 DIAGNOSIS — G4733 Obstructive sleep apnea (adult) (pediatric): Secondary | ICD-10-CM | POA: Diagnosis not present

## 2013-08-20 DIAGNOSIS — Z01818 Encounter for other preprocedural examination: Secondary | ICD-10-CM | POA: Diagnosis not present

## 2013-08-20 DIAGNOSIS — Z79899 Other long term (current) drug therapy: Secondary | ICD-10-CM | POA: Diagnosis not present

## 2013-08-20 DIAGNOSIS — E119 Type 2 diabetes mellitus without complications: Secondary | ICD-10-CM | POA: Diagnosis not present

## 2013-08-20 DIAGNOSIS — Z992 Dependence on renal dialysis: Secondary | ICD-10-CM | POA: Diagnosis not present

## 2013-08-20 DIAGNOSIS — I739 Peripheral vascular disease, unspecified: Secondary | ICD-10-CM | POA: Diagnosis not present

## 2013-08-20 DIAGNOSIS — Z48816 Encounter for surgical aftercare following surgery on the genitourinary system: Secondary | ICD-10-CM | POA: Diagnosis not present

## 2013-08-20 DIAGNOSIS — Z452 Encounter for adjustment and management of vascular access device: Secondary | ICD-10-CM | POA: Diagnosis not present

## 2013-08-20 DIAGNOSIS — I129 Hypertensive chronic kidney disease with stage 1 through stage 4 chronic kidney disease, or unspecified chronic kidney disease: Secondary | ICD-10-CM | POA: Diagnosis not present

## 2013-08-20 DIAGNOSIS — N3010 Interstitial cystitis (chronic) without hematuria: Secondary | ICD-10-CM | POA: Diagnosis not present

## 2013-08-20 DIAGNOSIS — N186 End stage renal disease: Secondary | ICD-10-CM | POA: Diagnosis not present

## 2013-08-20 DIAGNOSIS — I120 Hypertensive chronic kidney disease with stage 5 chronic kidney disease or end stage renal disease: Secondary | ICD-10-CM | POA: Diagnosis not present

## 2013-08-21 DIAGNOSIS — D631 Anemia in chronic kidney disease: Secondary | ICD-10-CM | POA: Diagnosis not present

## 2013-08-21 DIAGNOSIS — T82598A Other mechanical complication of other cardiac and vascular devices and implants, initial encounter: Secondary | ICD-10-CM | POA: Diagnosis not present

## 2013-08-21 DIAGNOSIS — N039 Chronic nephritic syndrome with unspecified morphologic changes: Secondary | ICD-10-CM | POA: Diagnosis not present

## 2013-08-21 DIAGNOSIS — Z7901 Long term (current) use of anticoagulants: Secondary | ICD-10-CM | POA: Diagnosis not present

## 2013-08-21 DIAGNOSIS — N2581 Secondary hyperparathyroidism of renal origin: Secondary | ICD-10-CM | POA: Diagnosis not present

## 2013-08-21 DIAGNOSIS — N186 End stage renal disease: Secondary | ICD-10-CM | POA: Diagnosis not present

## 2013-08-22 DIAGNOSIS — N3010 Interstitial cystitis (chronic) without hematuria: Secondary | ICD-10-CM | POA: Diagnosis not present

## 2013-08-22 DIAGNOSIS — Z436 Encounter for attention to other artificial openings of urinary tract: Secondary | ICD-10-CM | POA: Diagnosis not present

## 2013-08-23 DIAGNOSIS — T82598A Other mechanical complication of other cardiac and vascular devices and implants, initial encounter: Secondary | ICD-10-CM | POA: Diagnosis not present

## 2013-08-23 DIAGNOSIS — N2581 Secondary hyperparathyroidism of renal origin: Secondary | ICD-10-CM | POA: Diagnosis not present

## 2013-08-23 DIAGNOSIS — N186 End stage renal disease: Secondary | ICD-10-CM | POA: Diagnosis not present

## 2013-08-23 DIAGNOSIS — D631 Anemia in chronic kidney disease: Secondary | ICD-10-CM | POA: Diagnosis not present

## 2013-08-25 ENCOUNTER — Telehealth: Payer: Self-pay | Admitting: *Deleted

## 2013-08-25 DIAGNOSIS — I129 Hypertensive chronic kidney disease with stage 1 through stage 4 chronic kidney disease, or unspecified chronic kidney disease: Secondary | ICD-10-CM | POA: Diagnosis not present

## 2013-08-25 DIAGNOSIS — Z452 Encounter for adjustment and management of vascular access device: Secondary | ICD-10-CM | POA: Diagnosis not present

## 2013-08-25 DIAGNOSIS — E119 Type 2 diabetes mellitus without complications: Secondary | ICD-10-CM | POA: Diagnosis not present

## 2013-08-25 DIAGNOSIS — N189 Chronic kidney disease, unspecified: Secondary | ICD-10-CM | POA: Diagnosis not present

## 2013-08-25 DIAGNOSIS — T82514S Breakdown (mechanical) of infusion catheter, sequela: Secondary | ICD-10-CM

## 2013-08-25 DIAGNOSIS — N3010 Interstitial cystitis (chronic) without hematuria: Secondary | ICD-10-CM | POA: Diagnosis not present

## 2013-08-25 DIAGNOSIS — Z48816 Encounter for surgical aftercare following surgery on the genitourinary system: Secondary | ICD-10-CM | POA: Diagnosis not present

## 2013-08-25 NOTE — Telephone Encounter (Signed)
Spoke with [name] at [facility]: catheter; pt had Hickman Cath placed at [facility] for IV ABXs and was then transferred to [facility] for surgery. Pt is seeing Dr. Maryjean Morn on Thursday, 07.02.15 for Fistula placement and will see if Hickman Cath can be removed during this placement. Referral to [facility] for removal has been placed, in case Dr. Maryjean Morn is unable to remove the Hickman cath at upcoming appt/SLS

## 2013-08-25 NOTE — Telephone Encounter (Signed)
Note ----- Message ----- From: Leeanne Rio, PA-C Sent: 08/19/2013 6:17 AM To: Rockwell Germany, CMA Can we call [name] with [facility] to have her verify if the patient has a Power Line venous catheter. ----- Message ----- From: Synthia Innocent Sent: 08/18/2013 9:16 AM To: Leeanne Rio, PA-C Good Morning! I spoke with [facility] and they state that they patient have not placed a hickman cath only a power line. Please advise.   LMOM with contact name and number for return call RE: patient's catheter per provider instructions/SLS

## 2013-08-25 NOTE — Telephone Encounter (Signed)
Referral placed to Interventional Radiology at [facility] for catheter removal.

## 2013-08-26 DIAGNOSIS — N2581 Secondary hyperparathyroidism of renal origin: Secondary | ICD-10-CM | POA: Diagnosis not present

## 2013-08-26 DIAGNOSIS — Z7901 Long term (current) use of anticoagulants: Secondary | ICD-10-CM | POA: Diagnosis not present

## 2013-08-26 DIAGNOSIS — N186 End stage renal disease: Secondary | ICD-10-CM | POA: Diagnosis not present

## 2013-08-26 DIAGNOSIS — T82598A Other mechanical complication of other cardiac and vascular devices and implants, initial encounter: Secondary | ICD-10-CM | POA: Diagnosis not present

## 2013-08-26 DIAGNOSIS — D631 Anemia in chronic kidney disease: Secondary | ICD-10-CM | POA: Diagnosis not present

## 2013-08-27 DIAGNOSIS — Z48816 Encounter for surgical aftercare following surgery on the genitourinary system: Secondary | ICD-10-CM | POA: Diagnosis not present

## 2013-08-27 DIAGNOSIS — I10 Essential (primary) hypertension: Secondary | ICD-10-CM | POA: Diagnosis not present

## 2013-08-27 DIAGNOSIS — I129 Hypertensive chronic kidney disease with stage 1 through stage 4 chronic kidney disease, or unspecified chronic kidney disease: Secondary | ICD-10-CM | POA: Diagnosis not present

## 2013-08-27 DIAGNOSIS — Z452 Encounter for adjustment and management of vascular access device: Secondary | ICD-10-CM | POA: Diagnosis not present

## 2013-08-27 DIAGNOSIS — N186 End stage renal disease: Secondary | ICD-10-CM | POA: Diagnosis not present

## 2013-08-27 DIAGNOSIS — N189 Chronic kidney disease, unspecified: Secondary | ICD-10-CM | POA: Diagnosis not present

## 2013-08-27 DIAGNOSIS — N3010 Interstitial cystitis (chronic) without hematuria: Secondary | ICD-10-CM | POA: Diagnosis not present

## 2013-08-27 DIAGNOSIS — E119 Type 2 diabetes mellitus without complications: Secondary | ICD-10-CM | POA: Diagnosis not present

## 2013-08-27 DIAGNOSIS — D649 Anemia, unspecified: Secondary | ICD-10-CM | POA: Diagnosis not present

## 2013-08-28 DIAGNOSIS — D509 Iron deficiency anemia, unspecified: Secondary | ICD-10-CM | POA: Diagnosis not present

## 2013-08-28 DIAGNOSIS — IMO0001 Reserved for inherently not codable concepts without codable children: Secondary | ICD-10-CM | POA: Diagnosis not present

## 2013-08-28 DIAGNOSIS — N186 End stage renal disease: Secondary | ICD-10-CM | POA: Diagnosis not present

## 2013-08-28 DIAGNOSIS — T82598A Other mechanical complication of other cardiac and vascular devices and implants, initial encounter: Secondary | ICD-10-CM | POA: Diagnosis not present

## 2013-08-28 DIAGNOSIS — N2581 Secondary hyperparathyroidism of renal origin: Secondary | ICD-10-CM | POA: Diagnosis not present

## 2013-08-28 DIAGNOSIS — E039 Hypothyroidism, unspecified: Secondary | ICD-10-CM | POA: Diagnosis not present

## 2013-08-28 DIAGNOSIS — D631 Anemia in chronic kidney disease: Secondary | ICD-10-CM | POA: Diagnosis not present

## 2013-08-29 DIAGNOSIS — Z48816 Encounter for surgical aftercare following surgery on the genitourinary system: Secondary | ICD-10-CM | POA: Diagnosis not present

## 2013-08-29 DIAGNOSIS — N3010 Interstitial cystitis (chronic) without hematuria: Secondary | ICD-10-CM | POA: Diagnosis not present

## 2013-08-29 DIAGNOSIS — Z452 Encounter for adjustment and management of vascular access device: Secondary | ICD-10-CM | POA: Diagnosis not present

## 2013-08-29 DIAGNOSIS — E119 Type 2 diabetes mellitus without complications: Secondary | ICD-10-CM | POA: Diagnosis not present

## 2013-08-29 DIAGNOSIS — N189 Chronic kidney disease, unspecified: Secondary | ICD-10-CM | POA: Diagnosis not present

## 2013-08-29 DIAGNOSIS — I129 Hypertensive chronic kidney disease with stage 1 through stage 4 chronic kidney disease, or unspecified chronic kidney disease: Secondary | ICD-10-CM | POA: Diagnosis not present

## 2013-08-30 DIAGNOSIS — N2581 Secondary hyperparathyroidism of renal origin: Secondary | ICD-10-CM | POA: Diagnosis not present

## 2013-08-30 DIAGNOSIS — D631 Anemia in chronic kidney disease: Secondary | ICD-10-CM | POA: Diagnosis not present

## 2013-08-30 DIAGNOSIS — N186 End stage renal disease: Secondary | ICD-10-CM | POA: Diagnosis not present

## 2013-08-30 DIAGNOSIS — T82598A Other mechanical complication of other cardiac and vascular devices and implants, initial encounter: Secondary | ICD-10-CM | POA: Diagnosis not present

## 2013-08-30 DIAGNOSIS — D509 Iron deficiency anemia, unspecified: Secondary | ICD-10-CM | POA: Diagnosis not present

## 2013-08-30 DIAGNOSIS — N039 Chronic nephritic syndrome with unspecified morphologic changes: Secondary | ICD-10-CM | POA: Diagnosis not present

## 2013-09-01 DIAGNOSIS — Z48816 Encounter for surgical aftercare following surgery on the genitourinary system: Secondary | ICD-10-CM | POA: Diagnosis not present

## 2013-09-01 DIAGNOSIS — N3010 Interstitial cystitis (chronic) without hematuria: Secondary | ICD-10-CM | POA: Diagnosis not present

## 2013-09-01 DIAGNOSIS — N189 Chronic kidney disease, unspecified: Secondary | ICD-10-CM | POA: Diagnosis not present

## 2013-09-01 DIAGNOSIS — I129 Hypertensive chronic kidney disease with stage 1 through stage 4 chronic kidney disease, or unspecified chronic kidney disease: Secondary | ICD-10-CM | POA: Diagnosis not present

## 2013-09-01 DIAGNOSIS — Z452 Encounter for adjustment and management of vascular access device: Secondary | ICD-10-CM | POA: Diagnosis not present

## 2013-09-01 DIAGNOSIS — E119 Type 2 diabetes mellitus without complications: Secondary | ICD-10-CM | POA: Diagnosis not present

## 2013-09-02 DIAGNOSIS — D509 Iron deficiency anemia, unspecified: Secondary | ICD-10-CM | POA: Diagnosis not present

## 2013-09-02 DIAGNOSIS — N186 End stage renal disease: Secondary | ICD-10-CM | POA: Diagnosis not present

## 2013-09-02 DIAGNOSIS — N2581 Secondary hyperparathyroidism of renal origin: Secondary | ICD-10-CM | POA: Diagnosis not present

## 2013-09-02 DIAGNOSIS — D631 Anemia in chronic kidney disease: Secondary | ICD-10-CM | POA: Diagnosis not present

## 2013-09-02 DIAGNOSIS — T82598A Other mechanical complication of other cardiac and vascular devices and implants, initial encounter: Secondary | ICD-10-CM | POA: Diagnosis not present

## 2013-09-03 DIAGNOSIS — N3010 Interstitial cystitis (chronic) without hematuria: Secondary | ICD-10-CM | POA: Diagnosis not present

## 2013-09-03 DIAGNOSIS — Z48816 Encounter for surgical aftercare following surgery on the genitourinary system: Secondary | ICD-10-CM | POA: Diagnosis not present

## 2013-09-03 DIAGNOSIS — Z452 Encounter for adjustment and management of vascular access device: Secondary | ICD-10-CM | POA: Diagnosis not present

## 2013-09-03 DIAGNOSIS — N189 Chronic kidney disease, unspecified: Secondary | ICD-10-CM | POA: Diagnosis not present

## 2013-09-03 DIAGNOSIS — I129 Hypertensive chronic kidney disease with stage 1 through stage 4 chronic kidney disease, or unspecified chronic kidney disease: Secondary | ICD-10-CM | POA: Diagnosis not present

## 2013-09-03 DIAGNOSIS — E119 Type 2 diabetes mellitus without complications: Secondary | ICD-10-CM | POA: Diagnosis not present

## 2013-09-04 DIAGNOSIS — N2581 Secondary hyperparathyroidism of renal origin: Secondary | ICD-10-CM | POA: Diagnosis not present

## 2013-09-04 DIAGNOSIS — T82598A Other mechanical complication of other cardiac and vascular devices and implants, initial encounter: Secondary | ICD-10-CM | POA: Diagnosis not present

## 2013-09-04 DIAGNOSIS — D631 Anemia in chronic kidney disease: Secondary | ICD-10-CM | POA: Diagnosis not present

## 2013-09-04 DIAGNOSIS — D509 Iron deficiency anemia, unspecified: Secondary | ICD-10-CM | POA: Diagnosis not present

## 2013-09-04 DIAGNOSIS — N039 Chronic nephritic syndrome with unspecified morphologic changes: Secondary | ICD-10-CM | POA: Diagnosis not present

## 2013-09-04 DIAGNOSIS — N186 End stage renal disease: Secondary | ICD-10-CM | POA: Diagnosis not present

## 2013-09-05 DIAGNOSIS — N3010 Interstitial cystitis (chronic) without hematuria: Secondary | ICD-10-CM | POA: Diagnosis not present

## 2013-09-05 DIAGNOSIS — E119 Type 2 diabetes mellitus without complications: Secondary | ICD-10-CM | POA: Diagnosis not present

## 2013-09-05 DIAGNOSIS — I129 Hypertensive chronic kidney disease with stage 1 through stage 4 chronic kidney disease, or unspecified chronic kidney disease: Secondary | ICD-10-CM | POA: Diagnosis not present

## 2013-09-05 DIAGNOSIS — N189 Chronic kidney disease, unspecified: Secondary | ICD-10-CM | POA: Diagnosis not present

## 2013-09-05 DIAGNOSIS — Z452 Encounter for adjustment and management of vascular access device: Secondary | ICD-10-CM | POA: Diagnosis not present

## 2013-09-05 DIAGNOSIS — Z48816 Encounter for surgical aftercare following surgery on the genitourinary system: Secondary | ICD-10-CM | POA: Diagnosis not present

## 2013-09-06 DIAGNOSIS — T82598A Other mechanical complication of other cardiac and vascular devices and implants, initial encounter: Secondary | ICD-10-CM | POA: Diagnosis not present

## 2013-09-06 DIAGNOSIS — N186 End stage renal disease: Secondary | ICD-10-CM | POA: Diagnosis not present

## 2013-09-06 DIAGNOSIS — N2581 Secondary hyperparathyroidism of renal origin: Secondary | ICD-10-CM | POA: Diagnosis not present

## 2013-09-06 DIAGNOSIS — N039 Chronic nephritic syndrome with unspecified morphologic changes: Secondary | ICD-10-CM | POA: Diagnosis not present

## 2013-09-06 DIAGNOSIS — D509 Iron deficiency anemia, unspecified: Secondary | ICD-10-CM | POA: Diagnosis not present

## 2013-09-06 DIAGNOSIS — D631 Anemia in chronic kidney disease: Secondary | ICD-10-CM | POA: Diagnosis not present

## 2013-09-08 DIAGNOSIS — E119 Type 2 diabetes mellitus without complications: Secondary | ICD-10-CM | POA: Diagnosis not present

## 2013-09-08 DIAGNOSIS — N189 Chronic kidney disease, unspecified: Secondary | ICD-10-CM | POA: Diagnosis not present

## 2013-09-08 DIAGNOSIS — N3010 Interstitial cystitis (chronic) without hematuria: Secondary | ICD-10-CM | POA: Diagnosis not present

## 2013-09-08 DIAGNOSIS — Z452 Encounter for adjustment and management of vascular access device: Secondary | ICD-10-CM | POA: Diagnosis not present

## 2013-09-08 DIAGNOSIS — Z48816 Encounter for surgical aftercare following surgery on the genitourinary system: Secondary | ICD-10-CM | POA: Diagnosis not present

## 2013-09-08 DIAGNOSIS — I129 Hypertensive chronic kidney disease with stage 1 through stage 4 chronic kidney disease, or unspecified chronic kidney disease: Secondary | ICD-10-CM | POA: Diagnosis not present

## 2013-09-09 DIAGNOSIS — N039 Chronic nephritic syndrome with unspecified morphologic changes: Secondary | ICD-10-CM | POA: Diagnosis not present

## 2013-09-09 DIAGNOSIS — N2581 Secondary hyperparathyroidism of renal origin: Secondary | ICD-10-CM | POA: Diagnosis not present

## 2013-09-09 DIAGNOSIS — N186 End stage renal disease: Secondary | ICD-10-CM | POA: Diagnosis not present

## 2013-09-09 DIAGNOSIS — D631 Anemia in chronic kidney disease: Secondary | ICD-10-CM | POA: Diagnosis not present

## 2013-09-09 DIAGNOSIS — T82598A Other mechanical complication of other cardiac and vascular devices and implants, initial encounter: Secondary | ICD-10-CM | POA: Diagnosis not present

## 2013-09-09 DIAGNOSIS — D509 Iron deficiency anemia, unspecified: Secondary | ICD-10-CM | POA: Diagnosis not present

## 2013-09-10 DIAGNOSIS — N186 End stage renal disease: Secondary | ICD-10-CM | POA: Diagnosis not present

## 2013-09-11 ENCOUNTER — Encounter: Payer: Self-pay | Admitting: Gastroenterology

## 2013-09-11 DIAGNOSIS — D509 Iron deficiency anemia, unspecified: Secondary | ICD-10-CM | POA: Diagnosis not present

## 2013-09-11 DIAGNOSIS — N186 End stage renal disease: Secondary | ICD-10-CM | POA: Diagnosis not present

## 2013-09-11 DIAGNOSIS — N2581 Secondary hyperparathyroidism of renal origin: Secondary | ICD-10-CM | POA: Diagnosis not present

## 2013-09-11 DIAGNOSIS — D631 Anemia in chronic kidney disease: Secondary | ICD-10-CM | POA: Diagnosis not present

## 2013-09-11 DIAGNOSIS — T82598A Other mechanical complication of other cardiac and vascular devices and implants, initial encounter: Secondary | ICD-10-CM | POA: Diagnosis not present

## 2013-09-11 DIAGNOSIS — N039 Chronic nephritic syndrome with unspecified morphologic changes: Secondary | ICD-10-CM | POA: Diagnosis not present

## 2013-09-12 DIAGNOSIS — Z452 Encounter for adjustment and management of vascular access device: Secondary | ICD-10-CM | POA: Diagnosis not present

## 2013-09-12 DIAGNOSIS — E119 Type 2 diabetes mellitus without complications: Secondary | ICD-10-CM | POA: Diagnosis not present

## 2013-09-12 DIAGNOSIS — N189 Chronic kidney disease, unspecified: Secondary | ICD-10-CM | POA: Diagnosis not present

## 2013-09-12 DIAGNOSIS — I129 Hypertensive chronic kidney disease with stage 1 through stage 4 chronic kidney disease, or unspecified chronic kidney disease: Secondary | ICD-10-CM | POA: Diagnosis not present

## 2013-09-12 DIAGNOSIS — N3010 Interstitial cystitis (chronic) without hematuria: Secondary | ICD-10-CM | POA: Diagnosis not present

## 2013-09-12 DIAGNOSIS — Z48816 Encounter for surgical aftercare following surgery on the genitourinary system: Secondary | ICD-10-CM | POA: Diagnosis not present

## 2013-09-15 DIAGNOSIS — N189 Chronic kidney disease, unspecified: Secondary | ICD-10-CM | POA: Diagnosis not present

## 2013-09-15 DIAGNOSIS — N186 End stage renal disease: Secondary | ICD-10-CM | POA: Diagnosis not present

## 2013-09-15 DIAGNOSIS — I129 Hypertensive chronic kidney disease with stage 1 through stage 4 chronic kidney disease, or unspecified chronic kidney disease: Secondary | ICD-10-CM | POA: Diagnosis not present

## 2013-09-15 DIAGNOSIS — D631 Anemia in chronic kidney disease: Secondary | ICD-10-CM | POA: Diagnosis not present

## 2013-09-15 DIAGNOSIS — E119 Type 2 diabetes mellitus without complications: Secondary | ICD-10-CM | POA: Diagnosis not present

## 2013-09-15 DIAGNOSIS — S88119A Complete traumatic amputation at level between knee and ankle, unspecified lower leg, initial encounter: Secondary | ICD-10-CM | POA: Diagnosis not present

## 2013-09-15 DIAGNOSIS — D509 Iron deficiency anemia, unspecified: Secondary | ICD-10-CM | POA: Diagnosis not present

## 2013-09-15 DIAGNOSIS — Z452 Encounter for adjustment and management of vascular access device: Secondary | ICD-10-CM | POA: Diagnosis not present

## 2013-09-15 DIAGNOSIS — Z48816 Encounter for surgical aftercare following surgery on the genitourinary system: Secondary | ICD-10-CM | POA: Diagnosis not present

## 2013-09-15 DIAGNOSIS — Z436 Encounter for attention to other artificial openings of urinary tract: Secondary | ICD-10-CM | POA: Diagnosis not present

## 2013-09-15 DIAGNOSIS — N2581 Secondary hyperparathyroidism of renal origin: Secondary | ICD-10-CM | POA: Diagnosis not present

## 2013-09-15 DIAGNOSIS — T82598A Other mechanical complication of other cardiac and vascular devices and implants, initial encounter: Secondary | ICD-10-CM | POA: Diagnosis not present

## 2013-09-15 DIAGNOSIS — Z794 Long term (current) use of insulin: Secondary | ICD-10-CM | POA: Diagnosis not present

## 2013-09-16 DIAGNOSIS — Z452 Encounter for adjustment and management of vascular access device: Secondary | ICD-10-CM | POA: Diagnosis not present

## 2013-09-16 DIAGNOSIS — I129 Hypertensive chronic kidney disease with stage 1 through stage 4 chronic kidney disease, or unspecified chronic kidney disease: Secondary | ICD-10-CM | POA: Diagnosis not present

## 2013-09-16 DIAGNOSIS — Z436 Encounter for attention to other artificial openings of urinary tract: Secondary | ICD-10-CM | POA: Diagnosis not present

## 2013-09-16 DIAGNOSIS — N189 Chronic kidney disease, unspecified: Secondary | ICD-10-CM | POA: Diagnosis not present

## 2013-09-16 DIAGNOSIS — E119 Type 2 diabetes mellitus without complications: Secondary | ICD-10-CM | POA: Diagnosis not present

## 2013-09-16 DIAGNOSIS — Z48816 Encounter for surgical aftercare following surgery on the genitourinary system: Secondary | ICD-10-CM | POA: Diagnosis not present

## 2013-09-17 DIAGNOSIS — Z9889 Other specified postprocedural states: Secondary | ICD-10-CM | POA: Diagnosis not present

## 2013-09-17 DIAGNOSIS — Z794 Long term (current) use of insulin: Secondary | ICD-10-CM | POA: Diagnosis not present

## 2013-09-17 DIAGNOSIS — E785 Hyperlipidemia, unspecified: Secondary | ICD-10-CM | POA: Diagnosis not present

## 2013-09-17 DIAGNOSIS — Z01818 Encounter for other preprocedural examination: Secondary | ICD-10-CM | POA: Diagnosis not present

## 2013-09-17 DIAGNOSIS — E079 Disorder of thyroid, unspecified: Secondary | ICD-10-CM | POA: Diagnosis not present

## 2013-09-17 DIAGNOSIS — Z79899 Other long term (current) drug therapy: Secondary | ICD-10-CM | POA: Diagnosis not present

## 2013-09-17 DIAGNOSIS — Z992 Dependence on renal dialysis: Secondary | ICD-10-CM | POA: Diagnosis not present

## 2013-09-17 DIAGNOSIS — G4733 Obstructive sleep apnea (adult) (pediatric): Secondary | ICD-10-CM | POA: Diagnosis not present

## 2013-09-17 DIAGNOSIS — I120 Hypertensive chronic kidney disease with stage 5 chronic kidney disease or end stage renal disease: Secondary | ICD-10-CM | POA: Diagnosis not present

## 2013-09-17 DIAGNOSIS — I739 Peripheral vascular disease, unspecified: Secondary | ICD-10-CM | POA: Diagnosis not present

## 2013-09-17 DIAGNOSIS — E119 Type 2 diabetes mellitus without complications: Secondary | ICD-10-CM | POA: Diagnosis not present

## 2013-09-17 DIAGNOSIS — N186 End stage renal disease: Secondary | ICD-10-CM | POA: Diagnosis not present

## 2013-09-17 DIAGNOSIS — M1990 Unspecified osteoarthritis, unspecified site: Secondary | ICD-10-CM | POA: Diagnosis not present

## 2013-09-18 DIAGNOSIS — Z436 Encounter for attention to other artificial openings of urinary tract: Secondary | ICD-10-CM | POA: Diagnosis not present

## 2013-09-18 DIAGNOSIS — T82598A Other mechanical complication of other cardiac and vascular devices and implants, initial encounter: Secondary | ICD-10-CM | POA: Diagnosis not present

## 2013-09-18 DIAGNOSIS — Z48816 Encounter for surgical aftercare following surgery on the genitourinary system: Secondary | ICD-10-CM | POA: Diagnosis not present

## 2013-09-18 DIAGNOSIS — D509 Iron deficiency anemia, unspecified: Secondary | ICD-10-CM | POA: Diagnosis not present

## 2013-09-18 DIAGNOSIS — N186 End stage renal disease: Secondary | ICD-10-CM | POA: Diagnosis not present

## 2013-09-18 DIAGNOSIS — E119 Type 2 diabetes mellitus without complications: Secondary | ICD-10-CM | POA: Diagnosis not present

## 2013-09-18 DIAGNOSIS — N2581 Secondary hyperparathyroidism of renal origin: Secondary | ICD-10-CM | POA: Diagnosis not present

## 2013-09-18 DIAGNOSIS — N189 Chronic kidney disease, unspecified: Secondary | ICD-10-CM | POA: Diagnosis not present

## 2013-09-18 DIAGNOSIS — D631 Anemia in chronic kidney disease: Secondary | ICD-10-CM | POA: Diagnosis not present

## 2013-09-18 DIAGNOSIS — Z452 Encounter for adjustment and management of vascular access device: Secondary | ICD-10-CM | POA: Diagnosis not present

## 2013-09-18 DIAGNOSIS — I129 Hypertensive chronic kidney disease with stage 1 through stage 4 chronic kidney disease, or unspecified chronic kidney disease: Secondary | ICD-10-CM | POA: Diagnosis not present

## 2013-09-19 DIAGNOSIS — Z794 Long term (current) use of insulin: Secondary | ICD-10-CM | POA: Diagnosis not present

## 2013-09-19 DIAGNOSIS — E119 Type 2 diabetes mellitus without complications: Secondary | ICD-10-CM | POA: Diagnosis not present

## 2013-09-19 DIAGNOSIS — Z992 Dependence on renal dialysis: Secondary | ICD-10-CM | POA: Diagnosis not present

## 2013-09-19 DIAGNOSIS — I120 Hypertensive chronic kidney disease with stage 5 chronic kidney disease or end stage renal disease: Secondary | ICD-10-CM | POA: Diagnosis not present

## 2013-09-19 DIAGNOSIS — Z436 Encounter for attention to other artificial openings of urinary tract: Secondary | ICD-10-CM | POA: Diagnosis not present

## 2013-09-19 DIAGNOSIS — I129 Hypertensive chronic kidney disease with stage 1 through stage 4 chronic kidney disease, or unspecified chronic kidney disease: Secondary | ICD-10-CM | POA: Diagnosis not present

## 2013-09-19 DIAGNOSIS — N189 Chronic kidney disease, unspecified: Secondary | ICD-10-CM | POA: Diagnosis not present

## 2013-09-19 DIAGNOSIS — T8132XA Disruption of internal operation (surgical) wound, not elsewhere classified, initial encounter: Secondary | ICD-10-CM | POA: Diagnosis not present

## 2013-09-19 DIAGNOSIS — Z48816 Encounter for surgical aftercare following surgery on the genitourinary system: Secondary | ICD-10-CM | POA: Diagnosis not present

## 2013-09-19 DIAGNOSIS — E46 Unspecified protein-calorie malnutrition: Secondary | ICD-10-CM | POA: Diagnosis not present

## 2013-09-19 DIAGNOSIS — Z452 Encounter for adjustment and management of vascular access device: Secondary | ICD-10-CM | POA: Diagnosis not present

## 2013-09-19 DIAGNOSIS — L98499 Non-pressure chronic ulcer of skin of other sites with unspecified severity: Secondary | ICD-10-CM | POA: Diagnosis not present

## 2013-09-19 DIAGNOSIS — N186 End stage renal disease: Secondary | ICD-10-CM | POA: Diagnosis not present

## 2013-09-20 DIAGNOSIS — D509 Iron deficiency anemia, unspecified: Secondary | ICD-10-CM | POA: Diagnosis not present

## 2013-09-20 DIAGNOSIS — N186 End stage renal disease: Secondary | ICD-10-CM | POA: Diagnosis not present

## 2013-09-20 DIAGNOSIS — N2581 Secondary hyperparathyroidism of renal origin: Secondary | ICD-10-CM | POA: Diagnosis not present

## 2013-09-20 DIAGNOSIS — D631 Anemia in chronic kidney disease: Secondary | ICD-10-CM | POA: Diagnosis not present

## 2013-09-20 DIAGNOSIS — T82598A Other mechanical complication of other cardiac and vascular devices and implants, initial encounter: Secondary | ICD-10-CM | POA: Diagnosis not present

## 2013-09-22 DIAGNOSIS — Z452 Encounter for adjustment and management of vascular access device: Secondary | ICD-10-CM | POA: Diagnosis not present

## 2013-09-22 DIAGNOSIS — Z48816 Encounter for surgical aftercare following surgery on the genitourinary system: Secondary | ICD-10-CM | POA: Diagnosis not present

## 2013-09-22 DIAGNOSIS — I129 Hypertensive chronic kidney disease with stage 1 through stage 4 chronic kidney disease, or unspecified chronic kidney disease: Secondary | ICD-10-CM | POA: Diagnosis not present

## 2013-09-22 DIAGNOSIS — E119 Type 2 diabetes mellitus without complications: Secondary | ICD-10-CM | POA: Diagnosis not present

## 2013-09-22 DIAGNOSIS — Z436 Encounter for attention to other artificial openings of urinary tract: Secondary | ICD-10-CM | POA: Diagnosis not present

## 2013-09-22 DIAGNOSIS — N189 Chronic kidney disease, unspecified: Secondary | ICD-10-CM | POA: Diagnosis not present

## 2013-09-23 DIAGNOSIS — T82598A Other mechanical complication of other cardiac and vascular devices and implants, initial encounter: Secondary | ICD-10-CM | POA: Diagnosis not present

## 2013-09-23 DIAGNOSIS — N039 Chronic nephritic syndrome with unspecified morphologic changes: Secondary | ICD-10-CM | POA: Diagnosis not present

## 2013-09-23 DIAGNOSIS — D509 Iron deficiency anemia, unspecified: Secondary | ICD-10-CM | POA: Diagnosis not present

## 2013-09-23 DIAGNOSIS — N2581 Secondary hyperparathyroidism of renal origin: Secondary | ICD-10-CM | POA: Diagnosis not present

## 2013-09-23 DIAGNOSIS — D631 Anemia in chronic kidney disease: Secondary | ICD-10-CM | POA: Diagnosis not present

## 2013-09-23 DIAGNOSIS — N186 End stage renal disease: Secondary | ICD-10-CM | POA: Diagnosis not present

## 2013-09-24 DIAGNOSIS — E119 Type 2 diabetes mellitus without complications: Secondary | ICD-10-CM | POA: Diagnosis not present

## 2013-09-24 DIAGNOSIS — Z48816 Encounter for surgical aftercare following surgery on the genitourinary system: Secondary | ICD-10-CM | POA: Diagnosis not present

## 2013-09-24 DIAGNOSIS — Z436 Encounter for attention to other artificial openings of urinary tract: Secondary | ICD-10-CM | POA: Diagnosis not present

## 2013-09-24 DIAGNOSIS — Z452 Encounter for adjustment and management of vascular access device: Secondary | ICD-10-CM | POA: Diagnosis not present

## 2013-09-24 DIAGNOSIS — I129 Hypertensive chronic kidney disease with stage 1 through stage 4 chronic kidney disease, or unspecified chronic kidney disease: Secondary | ICD-10-CM | POA: Diagnosis not present

## 2013-09-24 DIAGNOSIS — N189 Chronic kidney disease, unspecified: Secondary | ICD-10-CM | POA: Diagnosis not present

## 2013-09-25 DIAGNOSIS — N2581 Secondary hyperparathyroidism of renal origin: Secondary | ICD-10-CM | POA: Diagnosis not present

## 2013-09-25 DIAGNOSIS — N186 End stage renal disease: Secondary | ICD-10-CM | POA: Diagnosis not present

## 2013-09-25 DIAGNOSIS — N039 Chronic nephritic syndrome with unspecified morphologic changes: Secondary | ICD-10-CM | POA: Diagnosis not present

## 2013-09-25 DIAGNOSIS — T82598A Other mechanical complication of other cardiac and vascular devices and implants, initial encounter: Secondary | ICD-10-CM | POA: Diagnosis not present

## 2013-09-25 DIAGNOSIS — D509 Iron deficiency anemia, unspecified: Secondary | ICD-10-CM | POA: Diagnosis not present

## 2013-09-25 DIAGNOSIS — D631 Anemia in chronic kidney disease: Secondary | ICD-10-CM | POA: Diagnosis not present

## 2013-09-26 DIAGNOSIS — Z452 Encounter for adjustment and management of vascular access device: Secondary | ICD-10-CM | POA: Diagnosis not present

## 2013-09-26 DIAGNOSIS — E119 Type 2 diabetes mellitus without complications: Secondary | ICD-10-CM | POA: Diagnosis not present

## 2013-09-26 DIAGNOSIS — I129 Hypertensive chronic kidney disease with stage 1 through stage 4 chronic kidney disease, or unspecified chronic kidney disease: Secondary | ICD-10-CM | POA: Diagnosis not present

## 2013-09-26 DIAGNOSIS — N189 Chronic kidney disease, unspecified: Secondary | ICD-10-CM | POA: Diagnosis not present

## 2013-09-26 DIAGNOSIS — Z48816 Encounter for surgical aftercare following surgery on the genitourinary system: Secondary | ICD-10-CM | POA: Diagnosis not present

## 2013-09-26 DIAGNOSIS — Z436 Encounter for attention to other artificial openings of urinary tract: Secondary | ICD-10-CM | POA: Diagnosis not present

## 2013-09-27 DIAGNOSIS — N2581 Secondary hyperparathyroidism of renal origin: Secondary | ICD-10-CM | POA: Diagnosis not present

## 2013-09-27 DIAGNOSIS — N186 End stage renal disease: Secondary | ICD-10-CM | POA: Diagnosis not present

## 2013-09-27 DIAGNOSIS — N039 Chronic nephritic syndrome with unspecified morphologic changes: Secondary | ICD-10-CM | POA: Diagnosis not present

## 2013-09-27 DIAGNOSIS — N185 Chronic kidney disease, stage 5: Secondary | ICD-10-CM | POA: Diagnosis not present

## 2013-09-27 DIAGNOSIS — D631 Anemia in chronic kidney disease: Secondary | ICD-10-CM | POA: Diagnosis not present

## 2013-09-27 DIAGNOSIS — I10 Essential (primary) hypertension: Secondary | ICD-10-CM | POA: Diagnosis not present

## 2013-09-30 DIAGNOSIS — N186 End stage renal disease: Secondary | ICD-10-CM | POA: Diagnosis not present

## 2013-10-02 DIAGNOSIS — N2581 Secondary hyperparathyroidism of renal origin: Secondary | ICD-10-CM | POA: Diagnosis not present

## 2013-10-02 DIAGNOSIS — N186 End stage renal disease: Secondary | ICD-10-CM | POA: Diagnosis not present

## 2013-10-02 DIAGNOSIS — IMO0001 Reserved for inherently not codable concepts without codable children: Secondary | ICD-10-CM | POA: Diagnosis not present

## 2013-10-02 DIAGNOSIS — D631 Anemia in chronic kidney disease: Secondary | ICD-10-CM | POA: Diagnosis not present

## 2013-10-03 DIAGNOSIS — N189 Chronic kidney disease, unspecified: Secondary | ICD-10-CM | POA: Diagnosis not present

## 2013-10-03 DIAGNOSIS — E119 Type 2 diabetes mellitus without complications: Secondary | ICD-10-CM | POA: Diagnosis not present

## 2013-10-03 DIAGNOSIS — I129 Hypertensive chronic kidney disease with stage 1 through stage 4 chronic kidney disease, or unspecified chronic kidney disease: Secondary | ICD-10-CM | POA: Diagnosis not present

## 2013-10-03 DIAGNOSIS — Z436 Encounter for attention to other artificial openings of urinary tract: Secondary | ICD-10-CM | POA: Diagnosis not present

## 2013-10-03 DIAGNOSIS — Z452 Encounter for adjustment and management of vascular access device: Secondary | ICD-10-CM | POA: Diagnosis not present

## 2013-10-03 DIAGNOSIS — Z48816 Encounter for surgical aftercare following surgery on the genitourinary system: Secondary | ICD-10-CM | POA: Diagnosis not present

## 2013-10-04 DIAGNOSIS — N2581 Secondary hyperparathyroidism of renal origin: Secondary | ICD-10-CM | POA: Diagnosis not present

## 2013-10-04 DIAGNOSIS — N186 End stage renal disease: Secondary | ICD-10-CM | POA: Diagnosis not present

## 2013-10-04 DIAGNOSIS — D631 Anemia in chronic kidney disease: Secondary | ICD-10-CM | POA: Diagnosis not present

## 2013-10-04 DIAGNOSIS — N039 Chronic nephritic syndrome with unspecified morphologic changes: Secondary | ICD-10-CM | POA: Diagnosis not present

## 2013-10-06 DIAGNOSIS — Z452 Encounter for adjustment and management of vascular access device: Secondary | ICD-10-CM | POA: Diagnosis not present

## 2013-10-06 DIAGNOSIS — I129 Hypertensive chronic kidney disease with stage 1 through stage 4 chronic kidney disease, or unspecified chronic kidney disease: Secondary | ICD-10-CM | POA: Diagnosis not present

## 2013-10-06 DIAGNOSIS — E119 Type 2 diabetes mellitus without complications: Secondary | ICD-10-CM | POA: Diagnosis not present

## 2013-10-06 DIAGNOSIS — N189 Chronic kidney disease, unspecified: Secondary | ICD-10-CM | POA: Diagnosis not present

## 2013-10-06 DIAGNOSIS — Z48816 Encounter for surgical aftercare following surgery on the genitourinary system: Secondary | ICD-10-CM | POA: Diagnosis not present

## 2013-10-06 DIAGNOSIS — Z436 Encounter for attention to other artificial openings of urinary tract: Secondary | ICD-10-CM | POA: Diagnosis not present

## 2013-10-07 DIAGNOSIS — N186 End stage renal disease: Secondary | ICD-10-CM | POA: Diagnosis not present

## 2013-10-07 DIAGNOSIS — N2581 Secondary hyperparathyroidism of renal origin: Secondary | ICD-10-CM | POA: Diagnosis not present

## 2013-10-07 DIAGNOSIS — D631 Anemia in chronic kidney disease: Secondary | ICD-10-CM | POA: Diagnosis not present

## 2013-10-08 DIAGNOSIS — N189 Chronic kidney disease, unspecified: Secondary | ICD-10-CM | POA: Diagnosis not present

## 2013-10-08 DIAGNOSIS — Z452 Encounter for adjustment and management of vascular access device: Secondary | ICD-10-CM | POA: Diagnosis not present

## 2013-10-08 DIAGNOSIS — I129 Hypertensive chronic kidney disease with stage 1 through stage 4 chronic kidney disease, or unspecified chronic kidney disease: Secondary | ICD-10-CM | POA: Diagnosis not present

## 2013-10-08 DIAGNOSIS — Z436 Encounter for attention to other artificial openings of urinary tract: Secondary | ICD-10-CM | POA: Diagnosis not present

## 2013-10-08 DIAGNOSIS — E119 Type 2 diabetes mellitus without complications: Secondary | ICD-10-CM | POA: Diagnosis not present

## 2013-10-08 DIAGNOSIS — Z48816 Encounter for surgical aftercare following surgery on the genitourinary system: Secondary | ICD-10-CM | POA: Diagnosis not present

## 2013-10-09 DIAGNOSIS — N186 End stage renal disease: Secondary | ICD-10-CM | POA: Diagnosis not present

## 2013-10-09 DIAGNOSIS — N2581 Secondary hyperparathyroidism of renal origin: Secondary | ICD-10-CM | POA: Diagnosis not present

## 2013-10-09 DIAGNOSIS — D631 Anemia in chronic kidney disease: Secondary | ICD-10-CM | POA: Diagnosis not present

## 2013-10-10 ENCOUNTER — Other Ambulatory Visit: Payer: Self-pay | Admitting: Physician Assistant

## 2013-10-11 DIAGNOSIS — D631 Anemia in chronic kidney disease: Secondary | ICD-10-CM | POA: Diagnosis not present

## 2013-10-11 DIAGNOSIS — N2581 Secondary hyperparathyroidism of renal origin: Secondary | ICD-10-CM | POA: Diagnosis not present

## 2013-10-11 DIAGNOSIS — N186 End stage renal disease: Secondary | ICD-10-CM | POA: Diagnosis not present

## 2013-10-13 ENCOUNTER — Telehealth: Payer: Self-pay | Admitting: Physician Assistant

## 2013-10-13 NOTE — Telephone Encounter (Signed)
Rx sent to pharmacy. LDM 

## 2013-10-13 NOTE — Telephone Encounter (Signed)
SHE WANTS AN ORDER FOR HEMOGLOBIN A1C AND PREALBUMIN LEVEL BECAUSE HIS ABD WOUND IS HEALING SO SLOWLY

## 2013-10-14 DIAGNOSIS — D631 Anemia in chronic kidney disease: Secondary | ICD-10-CM | POA: Diagnosis not present

## 2013-10-14 DIAGNOSIS — E119 Type 2 diabetes mellitus without complications: Secondary | ICD-10-CM | POA: Diagnosis not present

## 2013-10-14 DIAGNOSIS — Z452 Encounter for adjustment and management of vascular access device: Secondary | ICD-10-CM | POA: Diagnosis not present

## 2013-10-14 DIAGNOSIS — N189 Chronic kidney disease, unspecified: Secondary | ICD-10-CM | POA: Diagnosis not present

## 2013-10-14 DIAGNOSIS — I129 Hypertensive chronic kidney disease with stage 1 through stage 4 chronic kidney disease, or unspecified chronic kidney disease: Secondary | ICD-10-CM | POA: Diagnosis not present

## 2013-10-14 DIAGNOSIS — N186 End stage renal disease: Secondary | ICD-10-CM | POA: Diagnosis not present

## 2013-10-14 DIAGNOSIS — Z48816 Encounter for surgical aftercare following surgery on the genitourinary system: Secondary | ICD-10-CM | POA: Diagnosis not present

## 2013-10-14 DIAGNOSIS — N2581 Secondary hyperparathyroidism of renal origin: Secondary | ICD-10-CM | POA: Diagnosis not present

## 2013-10-14 DIAGNOSIS — Z436 Encounter for attention to other artificial openings of urinary tract: Secondary | ICD-10-CM | POA: Diagnosis not present

## 2013-10-15 DIAGNOSIS — N189 Chronic kidney disease, unspecified: Secondary | ICD-10-CM | POA: Diagnosis not present

## 2013-10-15 DIAGNOSIS — I129 Hypertensive chronic kidney disease with stage 1 through stage 4 chronic kidney disease, or unspecified chronic kidney disease: Secondary | ICD-10-CM | POA: Diagnosis not present

## 2013-10-15 DIAGNOSIS — Z48816 Encounter for surgical aftercare following surgery on the genitourinary system: Secondary | ICD-10-CM | POA: Diagnosis not present

## 2013-10-15 DIAGNOSIS — Z436 Encounter for attention to other artificial openings of urinary tract: Secondary | ICD-10-CM | POA: Diagnosis not present

## 2013-10-15 DIAGNOSIS — E119 Type 2 diabetes mellitus without complications: Secondary | ICD-10-CM | POA: Diagnosis not present

## 2013-10-15 DIAGNOSIS — Z452 Encounter for adjustment and management of vascular access device: Secondary | ICD-10-CM | POA: Diagnosis not present

## 2013-10-16 DIAGNOSIS — N186 End stage renal disease: Secondary | ICD-10-CM | POA: Diagnosis not present

## 2013-10-16 DIAGNOSIS — N039 Chronic nephritic syndrome with unspecified morphologic changes: Secondary | ICD-10-CM | POA: Diagnosis not present

## 2013-10-16 DIAGNOSIS — D631 Anemia in chronic kidney disease: Secondary | ICD-10-CM | POA: Diagnosis not present

## 2013-10-16 DIAGNOSIS — N2581 Secondary hyperparathyroidism of renal origin: Secondary | ICD-10-CM | POA: Diagnosis not present

## 2013-10-17 DIAGNOSIS — E119 Type 2 diabetes mellitus without complications: Secondary | ICD-10-CM | POA: Diagnosis not present

## 2013-10-17 DIAGNOSIS — N189 Chronic kidney disease, unspecified: Secondary | ICD-10-CM | POA: Diagnosis not present

## 2013-10-17 DIAGNOSIS — I129 Hypertensive chronic kidney disease with stage 1 through stage 4 chronic kidney disease, or unspecified chronic kidney disease: Secondary | ICD-10-CM | POA: Diagnosis not present

## 2013-10-17 DIAGNOSIS — Z436 Encounter for attention to other artificial openings of urinary tract: Secondary | ICD-10-CM | POA: Diagnosis not present

## 2013-10-17 DIAGNOSIS — Z48816 Encounter for surgical aftercare following surgery on the genitourinary system: Secondary | ICD-10-CM | POA: Diagnosis not present

## 2013-10-17 DIAGNOSIS — Z452 Encounter for adjustment and management of vascular access device: Secondary | ICD-10-CM | POA: Diagnosis not present

## 2013-10-18 DIAGNOSIS — N186 End stage renal disease: Secondary | ICD-10-CM | POA: Diagnosis not present

## 2013-10-18 DIAGNOSIS — N039 Chronic nephritic syndrome with unspecified morphologic changes: Secondary | ICD-10-CM | POA: Diagnosis not present

## 2013-10-18 DIAGNOSIS — D631 Anemia in chronic kidney disease: Secondary | ICD-10-CM | POA: Diagnosis not present

## 2013-10-18 DIAGNOSIS — N2581 Secondary hyperparathyroidism of renal origin: Secondary | ICD-10-CM | POA: Diagnosis not present

## 2013-10-20 DIAGNOSIS — Z48816 Encounter for surgical aftercare following surgery on the genitourinary system: Secondary | ICD-10-CM | POA: Diagnosis not present

## 2013-10-20 DIAGNOSIS — E119 Type 2 diabetes mellitus without complications: Secondary | ICD-10-CM | POA: Diagnosis not present

## 2013-10-20 DIAGNOSIS — N189 Chronic kidney disease, unspecified: Secondary | ICD-10-CM | POA: Diagnosis not present

## 2013-10-20 DIAGNOSIS — Z452 Encounter for adjustment and management of vascular access device: Secondary | ICD-10-CM | POA: Diagnosis not present

## 2013-10-20 DIAGNOSIS — Z436 Encounter for attention to other artificial openings of urinary tract: Secondary | ICD-10-CM | POA: Diagnosis not present

## 2013-10-20 DIAGNOSIS — I129 Hypertensive chronic kidney disease with stage 1 through stage 4 chronic kidney disease, or unspecified chronic kidney disease: Secondary | ICD-10-CM | POA: Diagnosis not present

## 2013-10-21 DIAGNOSIS — N186 End stage renal disease: Secondary | ICD-10-CM | POA: Diagnosis not present

## 2013-10-21 DIAGNOSIS — N2581 Secondary hyperparathyroidism of renal origin: Secondary | ICD-10-CM | POA: Diagnosis not present

## 2013-10-21 DIAGNOSIS — D631 Anemia in chronic kidney disease: Secondary | ICD-10-CM | POA: Diagnosis not present

## 2013-10-21 NOTE — Telephone Encounter (Signed)
Nurse calling again, has never heard anything back. Pt now has wounds on his right leg. Believes its from dry skin. Requesting wound care orders.

## 2013-10-21 NOTE — Telephone Encounter (Signed)
agree

## 2013-10-21 NOTE — Telephone Encounter (Signed)
Unfortunately, this went to [name]'s old student epic account so it was not addressed.   [name], could you please see if they can de-activated so this does not happen again?  [name]- please let nurse know that it is OK to order labs, however, I think that pt should also be scheduled to follow up in the office with [name] due to poor wound healing.

## 2013-10-21 NOTE — Telephone Encounter (Signed)
Thank you for making me aware.

## 2013-10-21 NOTE — Telephone Encounter (Signed)
Left detailed message on nurse's voicemail [name]) with below instructions and to have pt/family call to arrange appt with [facility] later this week or early next week.

## 2013-10-21 NOTE — Telephone Encounter (Signed)
Received call back from [facility] with [facility]. She states pt has appt on Friday with Dr Amalia Hailey and the wound center. She will have pt schedule appt with [place] as well. She also wants to know if she can get an order to apply a silver dressing to pt's wound?  Please advise.

## 2013-10-22 DIAGNOSIS — Z436 Encounter for attention to other artificial openings of urinary tract: Secondary | ICD-10-CM | POA: Diagnosis not present

## 2013-10-22 DIAGNOSIS — M1990 Unspecified osteoarthritis, unspecified site: Secondary | ICD-10-CM | POA: Diagnosis not present

## 2013-10-22 DIAGNOSIS — E1149 Type 2 diabetes mellitus with other diabetic neurological complication: Secondary | ICD-10-CM | POA: Diagnosis not present

## 2013-10-22 DIAGNOSIS — I82819 Embolism and thrombosis of superficial veins of unspecified lower extremities: Secondary | ICD-10-CM | POA: Diagnosis not present

## 2013-10-22 DIAGNOSIS — I120 Hypertensive chronic kidney disease with stage 5 chronic kidney disease or end stage renal disease: Secondary | ICD-10-CM | POA: Diagnosis not present

## 2013-10-22 DIAGNOSIS — E039 Hypothyroidism, unspecified: Secondary | ICD-10-CM | POA: Diagnosis not present

## 2013-10-22 DIAGNOSIS — Z48816 Encounter for surgical aftercare following surgery on the genitourinary system: Secondary | ICD-10-CM | POA: Diagnosis not present

## 2013-10-22 DIAGNOSIS — Z452 Encounter for adjustment and management of vascular access device: Secondary | ICD-10-CM | POA: Diagnosis not present

## 2013-10-22 DIAGNOSIS — E119 Type 2 diabetes mellitus without complications: Secondary | ICD-10-CM | POA: Diagnosis not present

## 2013-10-22 DIAGNOSIS — N189 Chronic kidney disease, unspecified: Secondary | ICD-10-CM | POA: Diagnosis not present

## 2013-10-22 DIAGNOSIS — L98499 Non-pressure chronic ulcer of skin of other sites with unspecified severity: Secondary | ICD-10-CM | POA: Diagnosis not present

## 2013-10-22 DIAGNOSIS — E1142 Type 2 diabetes mellitus with diabetic polyneuropathy: Secondary | ICD-10-CM | POA: Diagnosis not present

## 2013-10-22 DIAGNOSIS — N186 End stage renal disease: Secondary | ICD-10-CM | POA: Diagnosis not present

## 2013-10-22 DIAGNOSIS — Z794 Long term (current) use of insulin: Secondary | ICD-10-CM | POA: Diagnosis not present

## 2013-10-22 DIAGNOSIS — L0201 Cutaneous abscess of face: Secondary | ICD-10-CM | POA: Diagnosis not present

## 2013-10-22 DIAGNOSIS — I129 Hypertensive chronic kidney disease with stage 1 through stage 4 chronic kidney disease, or unspecified chronic kidney disease: Secondary | ICD-10-CM | POA: Diagnosis not present

## 2013-10-22 NOTE — Telephone Encounter (Signed)
Spoke with [name], RN w/[facility]. She is requesting order to apply silver algenate  (?sp) dressing 2-3 x weekly and would like to use methylex dressing to the "2 fully granulated wounds". Per verbal from NP, Inda Castle, ok to use dressings as requested. [name] has been made aware.

## 2013-10-22 NOTE — Telephone Encounter (Signed)
OK to apply silvadene 1% cream to wound once daily.

## 2013-10-23 DIAGNOSIS — N2581 Secondary hyperparathyroidism of renal origin: Secondary | ICD-10-CM | POA: Diagnosis not present

## 2013-10-23 DIAGNOSIS — D631 Anemia in chronic kidney disease: Secondary | ICD-10-CM | POA: Diagnosis not present

## 2013-10-23 DIAGNOSIS — N186 End stage renal disease: Secondary | ICD-10-CM | POA: Diagnosis not present

## 2013-10-24 ENCOUNTER — Telehealth: Payer: Self-pay | Admitting: Physician Assistant

## 2013-10-24 DIAGNOSIS — N179 Acute kidney failure, unspecified: Secondary | ICD-10-CM | POA: Diagnosis not present

## 2013-10-24 DIAGNOSIS — N189 Chronic kidney disease, unspecified: Secondary | ICD-10-CM | POA: Diagnosis not present

## 2013-10-24 NOTE — Telephone Encounter (Signed)
Patient needs follow-up visit to assess his wounds.  See previous phone note.  Also the request for diabetic footwear we received from patient, requires an office visit and recent diabetic foot exam before insurance will pay.  He needs to see me within 1-2 weeks.

## 2013-10-25 DIAGNOSIS — N2581 Secondary hyperparathyroidism of renal origin: Secondary | ICD-10-CM | POA: Diagnosis not present

## 2013-10-25 DIAGNOSIS — D631 Anemia in chronic kidney disease: Secondary | ICD-10-CM | POA: Diagnosis not present

## 2013-10-25 DIAGNOSIS — N186 End stage renal disease: Secondary | ICD-10-CM | POA: Diagnosis not present

## 2013-10-27 DIAGNOSIS — I82819 Embolism and thrombosis of superficial veins of unspecified lower extremities: Secondary | ICD-10-CM | POA: Diagnosis not present

## 2013-10-27 DIAGNOSIS — I129 Hypertensive chronic kidney disease with stage 1 through stage 4 chronic kidney disease, or unspecified chronic kidney disease: Secondary | ICD-10-CM | POA: Diagnosis not present

## 2013-10-27 DIAGNOSIS — N186 End stage renal disease: Secondary | ICD-10-CM | POA: Diagnosis not present

## 2013-10-27 DIAGNOSIS — E039 Hypothyroidism, unspecified: Secondary | ICD-10-CM | POA: Diagnosis not present

## 2013-10-27 DIAGNOSIS — N189 Chronic kidney disease, unspecified: Secondary | ICD-10-CM | POA: Diagnosis not present

## 2013-10-27 DIAGNOSIS — E1142 Type 2 diabetes mellitus with diabetic polyneuropathy: Secondary | ICD-10-CM | POA: Diagnosis not present

## 2013-10-27 DIAGNOSIS — Z436 Encounter for attention to other artificial openings of urinary tract: Secondary | ICD-10-CM | POA: Diagnosis not present

## 2013-10-27 DIAGNOSIS — L98499 Non-pressure chronic ulcer of skin of other sites with unspecified severity: Secondary | ICD-10-CM | POA: Diagnosis not present

## 2013-10-27 DIAGNOSIS — E119 Type 2 diabetes mellitus without complications: Secondary | ICD-10-CM | POA: Diagnosis not present

## 2013-10-27 DIAGNOSIS — E1149 Type 2 diabetes mellitus with other diabetic neurological complication: Secondary | ICD-10-CM | POA: Diagnosis not present

## 2013-10-27 DIAGNOSIS — Z452 Encounter for adjustment and management of vascular access device: Secondary | ICD-10-CM | POA: Diagnosis not present

## 2013-10-27 DIAGNOSIS — Z48816 Encounter for surgical aftercare following surgery on the genitourinary system: Secondary | ICD-10-CM | POA: Diagnosis not present

## 2013-10-28 DIAGNOSIS — I10 Essential (primary) hypertension: Secondary | ICD-10-CM | POA: Diagnosis not present

## 2013-10-28 DIAGNOSIS — N186 End stage renal disease: Secondary | ICD-10-CM | POA: Diagnosis not present

## 2013-10-28 DIAGNOSIS — D649 Anemia, unspecified: Secondary | ICD-10-CM | POA: Diagnosis not present

## 2013-10-28 NOTE — Telephone Encounter (Signed)
Pt scheduled for 11/05/13

## 2013-10-29 DIAGNOSIS — N189 Chronic kidney disease, unspecified: Secondary | ICD-10-CM | POA: Diagnosis not present

## 2013-10-29 DIAGNOSIS — I129 Hypertensive chronic kidney disease with stage 1 through stage 4 chronic kidney disease, or unspecified chronic kidney disease: Secondary | ICD-10-CM | POA: Diagnosis not present

## 2013-10-29 DIAGNOSIS — Z436 Encounter for attention to other artificial openings of urinary tract: Secondary | ICD-10-CM | POA: Diagnosis not present

## 2013-10-29 DIAGNOSIS — Z452 Encounter for adjustment and management of vascular access device: Secondary | ICD-10-CM | POA: Diagnosis not present

## 2013-10-29 DIAGNOSIS — E119 Type 2 diabetes mellitus without complications: Secondary | ICD-10-CM | POA: Diagnosis not present

## 2013-10-29 DIAGNOSIS — Z48816 Encounter for surgical aftercare following surgery on the genitourinary system: Secondary | ICD-10-CM | POA: Diagnosis not present

## 2013-10-30 DIAGNOSIS — IMO0001 Reserved for inherently not codable concepts without codable children: Secondary | ICD-10-CM | POA: Diagnosis not present

## 2013-10-30 DIAGNOSIS — N039 Chronic nephritic syndrome with unspecified morphologic changes: Secondary | ICD-10-CM | POA: Diagnosis not present

## 2013-10-30 DIAGNOSIS — Z23 Encounter for immunization: Secondary | ICD-10-CM | POA: Diagnosis not present

## 2013-10-30 DIAGNOSIS — N186 End stage renal disease: Secondary | ICD-10-CM | POA: Diagnosis not present

## 2013-10-30 DIAGNOSIS — D631 Anemia in chronic kidney disease: Secondary | ICD-10-CM | POA: Diagnosis not present

## 2013-10-31 DIAGNOSIS — Z7901 Long term (current) use of anticoagulants: Secondary | ICD-10-CM | POA: Diagnosis not present

## 2013-10-31 DIAGNOSIS — Z992 Dependence on renal dialysis: Secondary | ICD-10-CM | POA: Diagnosis not present

## 2013-10-31 DIAGNOSIS — Z79899 Other long term (current) drug therapy: Secondary | ICD-10-CM | POA: Diagnosis not present

## 2013-10-31 DIAGNOSIS — G4733 Obstructive sleep apnea (adult) (pediatric): Secondary | ICD-10-CM | POA: Diagnosis not present

## 2013-10-31 DIAGNOSIS — N138 Other obstructive and reflux uropathy: Secondary | ICD-10-CM | POA: Diagnosis not present

## 2013-10-31 DIAGNOSIS — M1990 Unspecified osteoarthritis, unspecified site: Secondary | ICD-10-CM | POA: Diagnosis not present

## 2013-10-31 DIAGNOSIS — Z6835 Body mass index (BMI) 35.0-35.9, adult: Secondary | ICD-10-CM | POA: Diagnosis not present

## 2013-10-31 DIAGNOSIS — E7800 Pure hypercholesterolemia, unspecified: Secondary | ICD-10-CM | POA: Diagnosis not present

## 2013-10-31 DIAGNOSIS — I739 Peripheral vascular disease, unspecified: Secondary | ICD-10-CM | POA: Diagnosis not present

## 2013-10-31 DIAGNOSIS — Z96649 Presence of unspecified artificial hip joint: Secondary | ICD-10-CM | POA: Diagnosis not present

## 2013-10-31 DIAGNOSIS — E079 Disorder of thyroid, unspecified: Secondary | ICD-10-CM | POA: Diagnosis not present

## 2013-10-31 DIAGNOSIS — N401 Enlarged prostate with lower urinary tract symptoms: Secondary | ICD-10-CM | POA: Diagnosis not present

## 2013-10-31 DIAGNOSIS — Z794 Long term (current) use of insulin: Secondary | ICD-10-CM | POA: Diagnosis not present

## 2013-10-31 DIAGNOSIS — N320 Bladder-neck obstruction: Secondary | ICD-10-CM | POA: Diagnosis not present

## 2013-10-31 DIAGNOSIS — N186 End stage renal disease: Secondary | ICD-10-CM | POA: Diagnosis not present

## 2013-10-31 DIAGNOSIS — I120 Hypertensive chronic kidney disease with stage 5 chronic kidney disease or end stage renal disease: Secondary | ICD-10-CM | POA: Diagnosis not present

## 2013-10-31 DIAGNOSIS — S88119A Complete traumatic amputation at level between knee and ankle, unspecified lower leg, initial encounter: Secondary | ICD-10-CM | POA: Diagnosis not present

## 2013-10-31 DIAGNOSIS — Z936 Other artificial openings of urinary tract status: Secondary | ICD-10-CM | POA: Diagnosis not present

## 2013-10-31 DIAGNOSIS — Z01818 Encounter for other preprocedural examination: Secondary | ICD-10-CM | POA: Diagnosis not present

## 2013-10-31 DIAGNOSIS — E119 Type 2 diabetes mellitus without complications: Secondary | ICD-10-CM | POA: Diagnosis not present

## 2013-11-01 DIAGNOSIS — Z23 Encounter for immunization: Secondary | ICD-10-CM | POA: Diagnosis not present

## 2013-11-01 DIAGNOSIS — N186 End stage renal disease: Secondary | ICD-10-CM | POA: Diagnosis not present

## 2013-11-01 DIAGNOSIS — D631 Anemia in chronic kidney disease: Secondary | ICD-10-CM | POA: Diagnosis not present

## 2013-11-03 DIAGNOSIS — N189 Chronic kidney disease, unspecified: Secondary | ICD-10-CM | POA: Diagnosis not present

## 2013-11-03 DIAGNOSIS — Z436 Encounter for attention to other artificial openings of urinary tract: Secondary | ICD-10-CM | POA: Diagnosis not present

## 2013-11-03 DIAGNOSIS — I129 Hypertensive chronic kidney disease with stage 1 through stage 4 chronic kidney disease, or unspecified chronic kidney disease: Secondary | ICD-10-CM | POA: Diagnosis not present

## 2013-11-03 DIAGNOSIS — Z452 Encounter for adjustment and management of vascular access device: Secondary | ICD-10-CM | POA: Diagnosis not present

## 2013-11-03 DIAGNOSIS — E119 Type 2 diabetes mellitus without complications: Secondary | ICD-10-CM | POA: Diagnosis not present

## 2013-11-03 DIAGNOSIS — Z48816 Encounter for surgical aftercare following surgery on the genitourinary system: Secondary | ICD-10-CM | POA: Diagnosis not present

## 2013-11-04 DIAGNOSIS — D631 Anemia in chronic kidney disease: Secondary | ICD-10-CM | POA: Diagnosis not present

## 2013-11-04 DIAGNOSIS — Z23 Encounter for immunization: Secondary | ICD-10-CM | POA: Diagnosis not present

## 2013-11-04 DIAGNOSIS — N186 End stage renal disease: Secondary | ICD-10-CM | POA: Diagnosis not present

## 2013-11-05 ENCOUNTER — Ambulatory Visit: Payer: BC Managed Care – PPO | Admitting: Physician Assistant

## 2013-11-05 DIAGNOSIS — I120 Hypertensive chronic kidney disease with stage 5 chronic kidney disease or end stage renal disease: Secondary | ICD-10-CM | POA: Diagnosis not present

## 2013-11-05 DIAGNOSIS — Z79899 Other long term (current) drug therapy: Secondary | ICD-10-CM | POA: Diagnosis not present

## 2013-11-05 DIAGNOSIS — E119 Type 2 diabetes mellitus without complications: Secondary | ICD-10-CM | POA: Diagnosis not present

## 2013-11-05 DIAGNOSIS — Z01818 Encounter for other preprocedural examination: Secondary | ICD-10-CM | POA: Diagnosis not present

## 2013-11-05 DIAGNOSIS — N186 End stage renal disease: Secondary | ICD-10-CM | POA: Diagnosis not present

## 2013-11-05 DIAGNOSIS — Z7901 Long term (current) use of anticoagulants: Secondary | ICD-10-CM | POA: Diagnosis not present

## 2013-11-07 DIAGNOSIS — N186 End stage renal disease: Secondary | ICD-10-CM | POA: Diagnosis not present

## 2013-11-07 DIAGNOSIS — N039 Chronic nephritic syndrome with unspecified morphologic changes: Secondary | ICD-10-CM | POA: Diagnosis not present

## 2013-11-07 DIAGNOSIS — D631 Anemia in chronic kidney disease: Secondary | ICD-10-CM | POA: Diagnosis not present

## 2013-11-07 DIAGNOSIS — Z23 Encounter for immunization: Secondary | ICD-10-CM | POA: Diagnosis not present

## 2013-11-08 DIAGNOSIS — N039 Chronic nephritic syndrome with unspecified morphologic changes: Secondary | ICD-10-CM | POA: Diagnosis not present

## 2013-11-08 DIAGNOSIS — Z23 Encounter for immunization: Secondary | ICD-10-CM | POA: Diagnosis not present

## 2013-11-08 DIAGNOSIS — N186 End stage renal disease: Secondary | ICD-10-CM | POA: Diagnosis not present

## 2013-11-08 DIAGNOSIS — D631 Anemia in chronic kidney disease: Secondary | ICD-10-CM | POA: Diagnosis not present

## 2013-11-10 ENCOUNTER — Telehealth: Payer: Self-pay | Admitting: Physician Assistant

## 2013-11-10 DIAGNOSIS — Z436 Encounter for attention to other artificial openings of urinary tract: Secondary | ICD-10-CM | POA: Diagnosis not present

## 2013-11-10 DIAGNOSIS — I129 Hypertensive chronic kidney disease with stage 1 through stage 4 chronic kidney disease, or unspecified chronic kidney disease: Secondary | ICD-10-CM | POA: Diagnosis not present

## 2013-11-10 DIAGNOSIS — E119 Type 2 diabetes mellitus without complications: Secondary | ICD-10-CM | POA: Diagnosis not present

## 2013-11-10 DIAGNOSIS — N189 Chronic kidney disease, unspecified: Secondary | ICD-10-CM | POA: Diagnosis not present

## 2013-11-10 DIAGNOSIS — Z452 Encounter for adjustment and management of vascular access device: Secondary | ICD-10-CM | POA: Diagnosis not present

## 2013-11-10 DIAGNOSIS — Z48816 Encounter for surgical aftercare following surgery on the genitourinary system: Secondary | ICD-10-CM | POA: Diagnosis not present

## 2013-11-10 NOTE — Telephone Encounter (Signed)
Will discuss thyroid medication with patient at his visit next week.  Hadar for nurse to add nonadherent dressing. Please tell RN to remind patient of his appointment next week.

## 2013-11-10 NOTE — Telephone Encounter (Signed)
Caller name: [name] Relation to pt: Malcolm Call back number:   Reason for call:  RN is calling to let PCP know that patient is not being compliment with Rx levothyroxine (SYNTHROID, LEVOTHROID) 25 MCG tablet.  He is out and will not get it filled.  Also, patient has skin tares and RN is wanting to put a nonadherent foam dressing.  Please contact [name]

## 2013-11-10 NOTE — Telephone Encounter (Signed)
LMOM with contact name and number [for return call, if needed] RE: Vo per provider for skin dressing and further provider instructions/SLS

## 2013-11-11 DIAGNOSIS — D631 Anemia in chronic kidney disease: Secondary | ICD-10-CM | POA: Diagnosis not present

## 2013-11-11 DIAGNOSIS — Z23 Encounter for immunization: Secondary | ICD-10-CM | POA: Diagnosis not present

## 2013-11-11 DIAGNOSIS — N186 End stage renal disease: Secondary | ICD-10-CM | POA: Diagnosis not present

## 2013-11-12 ENCOUNTER — Telehealth: Payer: Self-pay | Admitting: *Deleted

## 2013-11-12 ENCOUNTER — Encounter: Payer: Self-pay | Admitting: Physician Assistant

## 2013-11-12 ENCOUNTER — Ambulatory Visit (INDEPENDENT_AMBULATORY_CARE_PROVIDER_SITE_OTHER): Payer: Medicare Other | Admitting: Physician Assistant

## 2013-11-12 VITALS — BP 111/63 | HR 84 | Temp 98.4°F | Resp 16 | Ht 71.0 in

## 2013-11-12 DIAGNOSIS — Z452 Encounter for adjustment and management of vascular access device: Secondary | ICD-10-CM | POA: Diagnosis not present

## 2013-11-12 DIAGNOSIS — I129 Hypertensive chronic kidney disease with stage 1 through stage 4 chronic kidney disease, or unspecified chronic kidney disease: Secondary | ICD-10-CM | POA: Diagnosis not present

## 2013-11-12 DIAGNOSIS — E1169 Type 2 diabetes mellitus with other specified complication: Secondary | ICD-10-CM | POA: Diagnosis not present

## 2013-11-12 DIAGNOSIS — E119 Type 2 diabetes mellitus without complications: Secondary | ICD-10-CM | POA: Diagnosis not present

## 2013-11-12 DIAGNOSIS — Z436 Encounter for attention to other artificial openings of urinary tract: Secondary | ICD-10-CM | POA: Diagnosis not present

## 2013-11-12 DIAGNOSIS — S81809A Unspecified open wound, unspecified lower leg, initial encounter: Secondary | ICD-10-CM

## 2013-11-12 DIAGNOSIS — N189 Chronic kidney disease, unspecified: Secondary | ICD-10-CM | POA: Diagnosis not present

## 2013-11-12 DIAGNOSIS — Z48816 Encounter for surgical aftercare following surgery on the genitourinary system: Secondary | ICD-10-CM | POA: Diagnosis not present

## 2013-11-12 DIAGNOSIS — S81801A Unspecified open wound, right lower leg, initial encounter: Secondary | ICD-10-CM

## 2013-11-12 DIAGNOSIS — E118 Type 2 diabetes mellitus with unspecified complications: Secondary | ICD-10-CM

## 2013-11-12 MED ORDER — BAZA PROTECT EX CREA: TOPICAL_CREAM | Freq: Two times a day (BID) | CUTANEOUS | Status: DC | PRN

## 2013-11-12 NOTE — Progress Notes (Signed)
Pre visit review using our clinic review tool, if applicable. No additional management support is needed unless otherwise documented below in the visit note/SLS  

## 2013-11-12 NOTE — Patient Instructions (Signed)
Please continue medications as directed.  I will speak with LIsa concerning further wound care and lab checks.  YOu will be contacted by Podiatry and a Wound Care Specialist for appointments.  Follow-up with me in 1 month.  I will send in your forms for diabetic footwear.

## 2013-11-12 NOTE — Progress Notes (Signed)
Patient presents to clinic today for wound check of extremities and for diabetic foot examination for approval for Rx diabetic footwear.  Patient endorses doing well.  Has nurse visits twice weekly for assessment of extremities.  RN had noted some mild ulceration of RLE and encouraged patient to be seen.  Patient denies worsening of ulceration.  States it is mild and healing.  Denies drainage from site.  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  Diabetes is well controlled with current medication regimen.  A1C checked by Home Health every 3 months.  Last A1C was at 6.2. 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  Endorses some neuropathic symptoms of right foot.  Denies ulceration or other non-healing lesion of foot. Patient with history of recurrent DVT, previously on Coumadin but was non compliant with medication and INR check.  Patient has discontinued therapy and refuses to restart medication AMA.  Patient with severe CKD requiring dialysis and is not a candidate for other DVT/PE prophylactic measures at present.  Past Medical History  Diagnosis Date  . Sleep apnea     uses cpap  . Hypothyroidism   . Anemia   . Blood transfusion   . Chronic kidney disease     hx of kidney stones  . Hypertension   . Arthritis     osteoarthritis  . Pneumonia   . Diabetes mellitus     insulin dependent  . Peripheral vascular disease   . Hyperlipidemia   . Morbid obesity   . Hypertrophy of prostate   . Acute respiratory failure   . Cellulitis and abscess of leg   . Chronic kidney disease, stage III (moderate)   . Nephrolithiasis   . Anxiety   . Chronic pain   . Embolism and thrombosis of unspecified artery   . DVT (deep venous thrombosis)     Lower Extremity  . Personal history of arthritis     Osteoarthritis  . History of nephrolithiasis     Bilateral  . AR (allergic rhinitis)   . BPH (benign prostatic hypertrophy)    . Displacement of lumbar intervertebral disc without myelopathy     L4-L5  . Cholelithiasis   . Other lymphedema   . Unspecified septicemia   . Impotence of organic origin   . Pancreatitis, acute   . Diverticulosis of colon   . Gallstone     Current Outpatient Prescriptions on File Prior to Visit  Medication Sig Dispense Refill  . atorvastatin (LIPITOR) 40 MG tablet TAKE 1 TABLET EVERY DAY  30 tablet  2  . ferrous sulfate 325 (65 FE) MG tablet Take 325 mg by mouth 2 (two) times daily.       Marland Kitchen glucose blood (BAYER CONTOUR TEST) test strip Use as instructed to check blood sugar 3 - 4 times daily.  Dx 250.00  100 each  3  . insulin aspart (NOVOLOG) 100 UNIT/ML injection INJECT 3 UNITS THREE TIMES DAILY WITH MEALS. AND AT NIGHTIME IF BLOOD SUGARS GREATER THAN 150  20 mL  2  . insulin glargine (LANTUS) 100 UNIT/ML injection PATIENT REPORTS THAT HE TAKES 40 UNITS DAILY.  40 mL  5  . Insulin Syringe-Needle U-100 (INSULIN SYRINGE .5CC/31GX5/16") 31G X 5/16" 0.5 ML MISC USE AS DIRECTED TO INJECT INSULIN FOUR TIMES DAILY Dx: 250.00  120 each  5  . levothyroxine (SYNTHROID, LEVOTHROID) 200 MCG tablet TAKE 1 TABLET (200 MCG TOTAL) BY MOUTH EVERY MORNING.  30 tablet  2  . Multiple Vitamins-Minerals (MULTIVITAMINS THER. W/MINERALS) TABS Take 1 tablet by mouth daily.       . cyclobenzaprine (FLEXERIL) 5 MG tablet Take 5 mg by mouth 3 times/day as needed-between meals & bedtime.       . docusate sodium 100 MG CAPS Take 100 mg by mouth 2 (two) times daily.      Marland Kitchen HYDROcodone-acetaminophen (NORCO/VICODIN) 5-325 MG per tablet Take 1 tablet by mouth every 6 (six) hours as needed.  30 tablet  0  . levothyroxine (SYNTHROID, LEVOTHROID) 25 MCG tablet TAKE 1 TABLET (25 MCG TOTAL) BY MOUTH DAILY BEFORE BREAKFAST.  30 tablet  1  . lidocaine (XYLOCAINE) 2 % jelly Apply topically every 8 (eight) hours as needed (urethral pain).  30 mL  0   No current facility-administered medications on file prior to visit.     No Known Allergies  Family History  Problem Relation Age of Onset  . Diabetes Mother   . Heart attack Mother   . Stroke Mother   . Colon cancer Father   . Dementia Father   . Heart attack Father   . Arthritis Paternal Grandmother   . Hypertension Other   . Hypothyroidism Other     History   Social History  . Marital Status: Married    Spouse Name: N/A    Number of Children: N/A  . Years of Education: N/A   Social History Main Topics  . Smoking status: Never Smoker   . Smokeless tobacco: Never Used  . Alcohol Use: No  . Drug Use: No  . Sexual Activity: Yes   Other Topics Concern  . None   Social History Narrative   Lives with his wife and uses a wheelchair for transfers.     Review of Systems - See HPI.  All other ROS are negative.  BP 111/63  Pulse 84  Temp(Src) 98.4 F (36.9 C) (Oral)  Resp 16  Ht 5\' 11"  (1.803 m)  SpO2 99%  Physical Exam  Constitutional: He is oriented to person, place, and time and well-developed, well-nourished, and in no distress.  HENT:  Head: Normocephalic and atraumatic.  Right Ear: External ear normal.  Left Ear: External ear normal.  Nose: Nose normal.  Mouth/Throat: Oropharynx is clear and moist. No oropharyngeal exudate.  TM within normal limits bilaterally.  Cardiovascular: Normal rate, regular rhythm, normal heart sounds and intact distal pulses.   Pulmonary/Chest: Effort normal and breath sounds normal. No respiratory distress. He has no wheezes. He has no rales. He exhibits no tenderness.  Neurological: He is alert and oriented to person, place, and time.  Skin: Skin is warm and dry.      Assessment/Plan: Wound of right leg Shallow ulceration.  Area cdebrided with simple saline and gauze.  Topical antibiotic and barrier cream applied.  Nursing applied Vaseline dressing to the region.  Referral placed to Wound care.  Orders placed with Home Health to continue wound care with new instructions.  Follow-up in 1  month.  Diabetic foot Diabetic foot examination performed on R foot only and left foot has been amputated.  There is evidence of decreased sensation at multiple points of plantar surfaces of right foot with monofilament testing.  Toenails are long and thickened.  DP pulse 1+ of R foot.  PT pulse is 2+.  Referral placed to Podiatry and Wound Care.  Orders for diabetic footwear will be completed and faxed over.

## 2013-11-13 DIAGNOSIS — N039 Chronic nephritic syndrome with unspecified morphologic changes: Secondary | ICD-10-CM | POA: Diagnosis not present

## 2013-11-13 DIAGNOSIS — D631 Anemia in chronic kidney disease: Secondary | ICD-10-CM | POA: Diagnosis not present

## 2013-11-13 DIAGNOSIS — Z23 Encounter for immunization: Secondary | ICD-10-CM | POA: Diagnosis not present

## 2013-11-13 DIAGNOSIS — N186 End stage renal disease: Secondary | ICD-10-CM | POA: Diagnosis not present

## 2013-11-14 ENCOUNTER — Telehealth: Payer: Self-pay | Admitting: Physician Assistant

## 2013-11-14 DIAGNOSIS — Z436 Encounter for attention to other artificial openings of urinary tract: Secondary | ICD-10-CM | POA: Diagnosis not present

## 2013-11-14 DIAGNOSIS — Z992 Dependence on renal dialysis: Secondary | ICD-10-CM | POA: Diagnosis not present

## 2013-11-14 DIAGNOSIS — R269 Unspecified abnormalities of gait and mobility: Secondary | ICD-10-CM

## 2013-11-14 DIAGNOSIS — S88119A Complete traumatic amputation at level between knee and ankle, unspecified lower leg, initial encounter: Secondary | ICD-10-CM

## 2013-11-14 DIAGNOSIS — Z89512 Acquired absence of left leg below knee: Secondary | ICD-10-CM | POA: Diagnosis not present

## 2013-11-14 DIAGNOSIS — I872 Venous insufficiency (chronic) (peripheral): Secondary | ICD-10-CM

## 2013-11-14 DIAGNOSIS — I129 Hypertensive chronic kidney disease with stage 1 through stage 4 chronic kidney disease, or unspecified chronic kidney disease: Secondary | ICD-10-CM | POA: Diagnosis not present

## 2013-11-14 DIAGNOSIS — Z794 Long term (current) use of insulin: Secondary | ICD-10-CM | POA: Diagnosis not present

## 2013-11-14 DIAGNOSIS — Z48816 Encounter for surgical aftercare following surgery on the genitourinary system: Secondary | ICD-10-CM | POA: Diagnosis not present

## 2013-11-14 DIAGNOSIS — Z48815 Encounter for surgical aftercare following surgery on the digestive system: Secondary | ICD-10-CM | POA: Diagnosis not present

## 2013-11-14 DIAGNOSIS — N189 Chronic kidney disease, unspecified: Secondary | ICD-10-CM | POA: Diagnosis not present

## 2013-11-14 DIAGNOSIS — E119 Type 2 diabetes mellitus without complications: Secondary | ICD-10-CM | POA: Diagnosis not present

## 2013-11-14 NOTE — Telephone Encounter (Signed)
Please Advise

## 2013-11-14 NOTE — Telephone Encounter (Signed)
Requesting referral to be extended for home PT, [facility]

## 2013-11-15 DIAGNOSIS — Z23 Encounter for immunization: Secondary | ICD-10-CM | POA: Diagnosis not present

## 2013-11-15 DIAGNOSIS — D631 Anemia in chronic kidney disease: Secondary | ICD-10-CM | POA: Diagnosis not present

## 2013-11-15 DIAGNOSIS — N186 End stage renal disease: Secondary | ICD-10-CM | POA: Diagnosis not present

## 2013-11-15 DIAGNOSIS — N039 Chronic nephritic syndrome with unspecified morphologic changes: Secondary | ICD-10-CM | POA: Diagnosis not present

## 2013-11-16 NOTE — Telephone Encounter (Signed)
Placed new Referral for continuation of care.  This should restart the PT with the patient.  Delsa Sale -- Patient already with [facility].  Send new orders there.  Thanks.

## 2013-11-17 DIAGNOSIS — N189 Chronic kidney disease, unspecified: Secondary | ICD-10-CM | POA: Diagnosis not present

## 2013-11-17 DIAGNOSIS — E119 Type 2 diabetes mellitus without complications: Secondary | ICD-10-CM | POA: Diagnosis not present

## 2013-11-17 DIAGNOSIS — Z48815 Encounter for surgical aftercare following surgery on the digestive system: Secondary | ICD-10-CM | POA: Diagnosis not present

## 2013-11-17 DIAGNOSIS — Z89512 Acquired absence of left leg below knee: Secondary | ICD-10-CM | POA: Diagnosis not present

## 2013-11-17 DIAGNOSIS — I129 Hypertensive chronic kidney disease with stage 1 through stage 4 chronic kidney disease, or unspecified chronic kidney disease: Secondary | ICD-10-CM | POA: Diagnosis not present

## 2013-11-17 DIAGNOSIS — Z48816 Encounter for surgical aftercare following surgery on the genitourinary system: Secondary | ICD-10-CM | POA: Diagnosis not present

## 2013-11-18 DIAGNOSIS — N186 End stage renal disease: Secondary | ICD-10-CM | POA: Diagnosis not present

## 2013-11-18 DIAGNOSIS — D631 Anemia in chronic kidney disease: Secondary | ICD-10-CM | POA: Diagnosis not present

## 2013-11-18 DIAGNOSIS — Z48815 Encounter for surgical aftercare following surgery on the digestive system: Secondary | ICD-10-CM | POA: Diagnosis not present

## 2013-11-18 DIAGNOSIS — Z48816 Encounter for surgical aftercare following surgery on the genitourinary system: Secondary | ICD-10-CM | POA: Diagnosis not present

## 2013-11-18 DIAGNOSIS — Z23 Encounter for immunization: Secondary | ICD-10-CM | POA: Diagnosis not present

## 2013-11-18 DIAGNOSIS — Z89512 Acquired absence of left leg below knee: Secondary | ICD-10-CM | POA: Diagnosis not present

## 2013-11-18 DIAGNOSIS — I129 Hypertensive chronic kidney disease with stage 1 through stage 4 chronic kidney disease, or unspecified chronic kidney disease: Secondary | ICD-10-CM | POA: Diagnosis not present

## 2013-11-18 DIAGNOSIS — E119 Type 2 diabetes mellitus without complications: Secondary | ICD-10-CM | POA: Diagnosis not present

## 2013-11-18 DIAGNOSIS — N189 Chronic kidney disease, unspecified: Secondary | ICD-10-CM | POA: Diagnosis not present

## 2013-11-19 ENCOUNTER — Encounter: Payer: Self-pay | Admitting: Physician Assistant

## 2013-11-19 DIAGNOSIS — E119 Type 2 diabetes mellitus without complications: Secondary | ICD-10-CM | POA: Diagnosis not present

## 2013-11-19 DIAGNOSIS — I129 Hypertensive chronic kidney disease with stage 1 through stage 4 chronic kidney disease, or unspecified chronic kidney disease: Secondary | ICD-10-CM | POA: Diagnosis not present

## 2013-11-19 DIAGNOSIS — N189 Chronic kidney disease, unspecified: Secondary | ICD-10-CM | POA: Diagnosis not present

## 2013-11-19 DIAGNOSIS — Z48815 Encounter for surgical aftercare following surgery on the digestive system: Secondary | ICD-10-CM | POA: Diagnosis not present

## 2013-11-19 DIAGNOSIS — Z48816 Encounter for surgical aftercare following surgery on the genitourinary system: Secondary | ICD-10-CM | POA: Diagnosis not present

## 2013-11-19 DIAGNOSIS — Z89512 Acquired absence of left leg below knee: Secondary | ICD-10-CM | POA: Diagnosis not present

## 2013-11-20 DIAGNOSIS — S81801A Unspecified open wound, right lower leg, initial encounter: Secondary | ICD-10-CM | POA: Insufficient documentation

## 2013-11-20 DIAGNOSIS — E118 Type 2 diabetes mellitus with unspecified complications: Secondary | ICD-10-CM | POA: Insufficient documentation

## 2013-11-20 DIAGNOSIS — N186 End stage renal disease: Secondary | ICD-10-CM | POA: Diagnosis not present

## 2013-11-20 DIAGNOSIS — D631 Anemia in chronic kidney disease: Secondary | ICD-10-CM | POA: Diagnosis not present

## 2013-11-20 DIAGNOSIS — Z23 Encounter for immunization: Secondary | ICD-10-CM | POA: Diagnosis not present

## 2013-11-20 DIAGNOSIS — N039 Chronic nephritic syndrome with unspecified morphologic changes: Secondary | ICD-10-CM | POA: Diagnosis not present

## 2013-11-20 NOTE — Assessment & Plan Note (Signed)
Shallow ulceration.  Area cdebrided with simple saline and gauze.  Topical antibiotic and barrier cream applied.  Nursing applied Vaseline dressing to the region.  Referral placed to Wound care.  Orders placed with Home Health to continue wound care with new instructions.  Follow-up in 1 month.

## 2013-11-20 NOTE — Assessment & Plan Note (Signed)
Diabetic foot examination performed on R foot only and left foot has been amputated.  There is evidence of decreased sensation at multiple points of plantar surfaces of right foot with monofilament testing.  Toenails are long and thickened.  DP pulse 1+ of R foot.  PT pulse is 2+.  Referral placed to Podiatry and Wound Care.  Orders for diabetic footwear will be completed and faxed over.

## 2013-11-21 DIAGNOSIS — Z794 Long term (current) use of insulin: Secondary | ICD-10-CM | POA: Diagnosis not present

## 2013-11-21 DIAGNOSIS — M1990 Unspecified osteoarthritis, unspecified site: Secondary | ICD-10-CM | POA: Diagnosis not present

## 2013-11-21 DIAGNOSIS — L98499 Non-pressure chronic ulcer of skin of other sites with unspecified severity: Secondary | ICD-10-CM | POA: Diagnosis not present

## 2013-11-21 DIAGNOSIS — I120 Hypertensive chronic kidney disease with stage 5 chronic kidney disease or end stage renal disease: Secondary | ICD-10-CM | POA: Diagnosis not present

## 2013-11-21 DIAGNOSIS — E1149 Type 2 diabetes mellitus with other diabetic neurological complication: Secondary | ICD-10-CM | POA: Diagnosis not present

## 2013-11-21 DIAGNOSIS — N186 End stage renal disease: Secondary | ICD-10-CM | POA: Diagnosis not present

## 2013-11-21 DIAGNOSIS — E1142 Type 2 diabetes mellitus with diabetic polyneuropathy: Secondary | ICD-10-CM | POA: Diagnosis not present

## 2013-11-21 DIAGNOSIS — E039 Hypothyroidism, unspecified: Secondary | ICD-10-CM | POA: Diagnosis not present

## 2013-11-22 DIAGNOSIS — N039 Chronic nephritic syndrome with unspecified morphologic changes: Secondary | ICD-10-CM | POA: Diagnosis not present

## 2013-11-22 DIAGNOSIS — N186 End stage renal disease: Secondary | ICD-10-CM | POA: Diagnosis not present

## 2013-11-22 DIAGNOSIS — Z23 Encounter for immunization: Secondary | ICD-10-CM | POA: Diagnosis not present

## 2013-11-22 DIAGNOSIS — D631 Anemia in chronic kidney disease: Secondary | ICD-10-CM | POA: Diagnosis not present

## 2013-11-24 DIAGNOSIS — Z48815 Encounter for surgical aftercare following surgery on the digestive system: Secondary | ICD-10-CM | POA: Diagnosis not present

## 2013-11-24 DIAGNOSIS — Z89512 Acquired absence of left leg below knee: Secondary | ICD-10-CM | POA: Diagnosis not present

## 2013-11-24 DIAGNOSIS — E119 Type 2 diabetes mellitus without complications: Secondary | ICD-10-CM | POA: Diagnosis not present

## 2013-11-24 DIAGNOSIS — N189 Chronic kidney disease, unspecified: Secondary | ICD-10-CM | POA: Diagnosis not present

## 2013-11-24 DIAGNOSIS — Z48816 Encounter for surgical aftercare following surgery on the genitourinary system: Secondary | ICD-10-CM | POA: Diagnosis not present

## 2013-11-24 DIAGNOSIS — I129 Hypertensive chronic kidney disease with stage 1 through stage 4 chronic kidney disease, or unspecified chronic kidney disease: Secondary | ICD-10-CM | POA: Diagnosis not present

## 2013-11-25 DIAGNOSIS — N186 End stage renal disease: Secondary | ICD-10-CM | POA: Diagnosis not present

## 2013-11-25 DIAGNOSIS — D631 Anemia in chronic kidney disease: Secondary | ICD-10-CM | POA: Diagnosis not present

## 2013-11-25 DIAGNOSIS — Z23 Encounter for immunization: Secondary | ICD-10-CM | POA: Diagnosis not present

## 2013-11-26 DIAGNOSIS — Z89512 Acquired absence of left leg below knee: Secondary | ICD-10-CM | POA: Diagnosis not present

## 2013-11-26 DIAGNOSIS — E119 Type 2 diabetes mellitus without complications: Secondary | ICD-10-CM | POA: Diagnosis not present

## 2013-11-26 DIAGNOSIS — I129 Hypertensive chronic kidney disease with stage 1 through stage 4 chronic kidney disease, or unspecified chronic kidney disease: Secondary | ICD-10-CM | POA: Diagnosis not present

## 2013-11-26 DIAGNOSIS — N189 Chronic kidney disease, unspecified: Secondary | ICD-10-CM | POA: Diagnosis not present

## 2013-11-26 DIAGNOSIS — Z48816 Encounter for surgical aftercare following surgery on the genitourinary system: Secondary | ICD-10-CM | POA: Diagnosis not present

## 2013-11-26 DIAGNOSIS — Z48815 Encounter for surgical aftercare following surgery on the digestive system: Secondary | ICD-10-CM | POA: Diagnosis not present

## 2013-11-27 DIAGNOSIS — N186 End stage renal disease: Secondary | ICD-10-CM | POA: Diagnosis not present

## 2013-11-27 DIAGNOSIS — D631 Anemia in chronic kidney disease: Secondary | ICD-10-CM | POA: Diagnosis not present

## 2013-11-27 DIAGNOSIS — D649 Anemia, unspecified: Secondary | ICD-10-CM | POA: Diagnosis not present

## 2013-11-27 DIAGNOSIS — N2581 Secondary hyperparathyroidism of renal origin: Secondary | ICD-10-CM | POA: Diagnosis not present

## 2013-11-27 DIAGNOSIS — I10 Essential (primary) hypertension: Secondary | ICD-10-CM | POA: Diagnosis not present

## 2013-11-29 DIAGNOSIS — N186 End stage renal disease: Secondary | ICD-10-CM | POA: Diagnosis not present

## 2013-11-29 DIAGNOSIS — N2581 Secondary hyperparathyroidism of renal origin: Secondary | ICD-10-CM | POA: Diagnosis not present

## 2013-11-29 DIAGNOSIS — D631 Anemia in chronic kidney disease: Secondary | ICD-10-CM | POA: Diagnosis not present

## 2013-12-01 DIAGNOSIS — N186 End stage renal disease: Secondary | ICD-10-CM | POA: Diagnosis not present

## 2013-12-01 DIAGNOSIS — R17 Unspecified jaundice: Secondary | ICD-10-CM | POA: Diagnosis not present

## 2013-12-01 DIAGNOSIS — R1013 Epigastric pain: Secondary | ICD-10-CM | POA: Diagnosis not present

## 2013-12-01 DIAGNOSIS — R112 Nausea with vomiting, unspecified: Secondary | ICD-10-CM | POA: Diagnosis not present

## 2013-12-01 DIAGNOSIS — Z89512 Acquired absence of left leg below knee: Secondary | ICD-10-CM | POA: Diagnosis not present

## 2013-12-01 DIAGNOSIS — R10816 Epigastric abdominal tenderness: Secondary | ICD-10-CM | POA: Diagnosis not present

## 2013-12-01 DIAGNOSIS — R42 Dizziness and giddiness: Secondary | ICD-10-CM | POA: Diagnosis not present

## 2013-12-01 DIAGNOSIS — N3010 Interstitial cystitis (chronic) without hematuria: Secondary | ICD-10-CM | POA: Diagnosis present

## 2013-12-01 DIAGNOSIS — K810 Acute cholecystitis: Secondary | ICD-10-CM | POA: Diagnosis not present

## 2013-12-01 DIAGNOSIS — K8000 Calculus of gallbladder with acute cholecystitis without obstruction: Secondary | ICD-10-CM | POA: Diagnosis not present

## 2013-12-01 DIAGNOSIS — K8050 Calculus of bile duct without cholangitis or cholecystitis without obstruction: Secondary | ICD-10-CM | POA: Diagnosis not present

## 2013-12-01 DIAGNOSIS — D631 Anemia in chronic kidney disease: Secondary | ICD-10-CM | POA: Diagnosis present

## 2013-12-01 DIAGNOSIS — Z6834 Body mass index (BMI) 34.0-34.9, adult: Secondary | ICD-10-CM | POA: Diagnosis not present

## 2013-12-01 DIAGNOSIS — K8020 Calculus of gallbladder without cholecystitis without obstruction: Secondary | ICD-10-CM | POA: Diagnosis not present

## 2013-12-01 DIAGNOSIS — Z936 Other artificial openings of urinary tract status: Secondary | ICD-10-CM | POA: Diagnosis not present

## 2013-12-01 DIAGNOSIS — K8062 Calculus of gallbladder and bile duct with acute cholecystitis without obstruction: Secondary | ICD-10-CM | POA: Diagnosis not present

## 2013-12-01 DIAGNOSIS — E785 Hyperlipidemia, unspecified: Secondary | ICD-10-CM | POA: Diagnosis present

## 2013-12-01 DIAGNOSIS — K838 Other specified diseases of biliary tract: Secondary | ICD-10-CM | POA: Diagnosis not present

## 2013-12-01 DIAGNOSIS — K2970 Gastritis, unspecified, without bleeding: Secondary | ICD-10-CM | POA: Diagnosis not present

## 2013-12-01 DIAGNOSIS — D649 Anemia, unspecified: Secondary | ICD-10-CM | POA: Diagnosis not present

## 2013-12-01 DIAGNOSIS — K861 Other chronic pancreatitis: Secondary | ICD-10-CM | POA: Diagnosis present

## 2013-12-01 DIAGNOSIS — I480 Paroxysmal atrial fibrillation: Secondary | ICD-10-CM | POA: Diagnosis present

## 2013-12-01 DIAGNOSIS — E669 Obesity, unspecified: Secondary | ICD-10-CM | POA: Diagnosis present

## 2013-12-01 DIAGNOSIS — E119 Type 2 diabetes mellitus without complications: Secondary | ICD-10-CM | POA: Diagnosis present

## 2013-12-01 DIAGNOSIS — E875 Hyperkalemia: Secondary | ICD-10-CM | POA: Diagnosis present

## 2013-12-01 DIAGNOSIS — Z906 Acquired absence of other parts of urinary tract: Secondary | ICD-10-CM | POA: Diagnosis present

## 2013-12-01 DIAGNOSIS — N319 Neuromuscular dysfunction of bladder, unspecified: Secondary | ICD-10-CM | POA: Diagnosis present

## 2013-12-01 DIAGNOSIS — I10 Essential (primary) hypertension: Secondary | ICD-10-CM | POA: Diagnosis not present

## 2013-12-01 DIAGNOSIS — I120 Hypertensive chronic kidney disease with stage 5 chronic kidney disease or end stage renal disease: Secondary | ICD-10-CM | POA: Diagnosis not present

## 2013-12-01 DIAGNOSIS — G4733 Obstructive sleep apnea (adult) (pediatric): Secondary | ICD-10-CM | POA: Diagnosis present

## 2013-12-01 DIAGNOSIS — R110 Nausea: Secondary | ICD-10-CM | POA: Diagnosis not present

## 2013-12-01 DIAGNOSIS — Z992 Dependence on renal dialysis: Secondary | ICD-10-CM | POA: Diagnosis not present

## 2013-12-01 DIAGNOSIS — K851 Biliary acute pancreatitis: Secondary | ICD-10-CM | POA: Diagnosis not present

## 2013-12-01 DIAGNOSIS — E039 Hypothyroidism, unspecified: Secondary | ICD-10-CM | POA: Diagnosis not present

## 2013-12-01 DIAGNOSIS — K859 Acute pancreatitis, unspecified: Secondary | ICD-10-CM | POA: Diagnosis not present

## 2013-12-01 DIAGNOSIS — R945 Abnormal results of liver function studies: Secondary | ICD-10-CM | POA: Diagnosis not present

## 2013-12-01 DIAGNOSIS — Z9181 History of falling: Secondary | ICD-10-CM | POA: Diagnosis not present

## 2013-12-10 ENCOUNTER — Other Ambulatory Visit: Payer: Self-pay | Admitting: Physician Assistant

## 2013-12-12 ENCOUNTER — Other Ambulatory Visit: Payer: Self-pay

## 2013-12-12 ENCOUNTER — Telehealth: Payer: Self-pay | Admitting: Physician Assistant

## 2013-12-12 ENCOUNTER — Ambulatory Visit: Payer: Medicare Other | Admitting: Physician Assistant

## 2013-12-12 ENCOUNTER — Encounter (HOSPITAL_BASED_OUTPATIENT_CLINIC_OR_DEPARTMENT_OTHER): Payer: Medicare Other | Attending: General Surgery

## 2013-12-12 DIAGNOSIS — E119 Type 2 diabetes mellitus without complications: Secondary | ICD-10-CM | POA: Diagnosis not present

## 2013-12-12 DIAGNOSIS — N189 Chronic kidney disease, unspecified: Secondary | ICD-10-CM | POA: Diagnosis not present

## 2013-12-12 DIAGNOSIS — Z48816 Encounter for surgical aftercare following surgery on the genitourinary system: Secondary | ICD-10-CM | POA: Diagnosis not present

## 2013-12-12 DIAGNOSIS — Z48815 Encounter for surgical aftercare following surgery on the digestive system: Secondary | ICD-10-CM | POA: Diagnosis not present

## 2013-12-12 DIAGNOSIS — Z89512 Acquired absence of left leg below knee: Secondary | ICD-10-CM | POA: Diagnosis not present

## 2013-12-12 DIAGNOSIS — I129 Hypertensive chronic kidney disease with stage 1 through stage 4 chronic kidney disease, or unspecified chronic kidney disease: Secondary | ICD-10-CM | POA: Diagnosis not present

## 2013-12-12 NOTE — Telephone Encounter (Signed)
Giving his significant PMH, he needs to be evaluated.  Offer Saturday clinic or Urgent Care.  Can attempt immodium to help with diarrhea.  Avoid heavy foods.

## 2013-12-12 NOTE — Telephone Encounter (Signed)
Patient informed, understood & agreed; informed pt if no transportation, then call EMS for ED evaluation/SLS

## 2013-12-12 NOTE — Telephone Encounter (Signed)
Caller name:Bushnell, Wanda A Relation to PO:[id number]  Call back [phone]   Reason for call:  pt states he was just released from the hospital and states he is on dialysis as well he is expericing uncontrollable diaherra in need of clinical advice. Pt has a hospital follow up 10/23.

## 2013-12-13 DIAGNOSIS — D631 Anemia in chronic kidney disease: Secondary | ICD-10-CM | POA: Diagnosis not present

## 2013-12-13 DIAGNOSIS — N2581 Secondary hyperparathyroidism of renal origin: Secondary | ICD-10-CM | POA: Diagnosis not present

## 2013-12-13 DIAGNOSIS — N186 End stage renal disease: Secondary | ICD-10-CM | POA: Diagnosis not present

## 2013-12-14 ENCOUNTER — Other Ambulatory Visit: Payer: Self-pay | Admitting: Physician Assistant

## 2013-12-15 ENCOUNTER — Telehealth: Payer: Self-pay | Admitting: Physician Assistant

## 2013-12-15 ENCOUNTER — Other Ambulatory Visit: Payer: Self-pay | Admitting: Physician Assistant

## 2013-12-15 DIAGNOSIS — Z598 Other problems related to housing and economic circumstances: Secondary | ICD-10-CM

## 2013-12-15 DIAGNOSIS — Z48816 Encounter for surgical aftercare following surgery on the genitourinary system: Secondary | ICD-10-CM | POA: Diagnosis not present

## 2013-12-15 DIAGNOSIS — Z48815 Encounter for surgical aftercare following surgery on the digestive system: Secondary | ICD-10-CM | POA: Diagnosis not present

## 2013-12-15 DIAGNOSIS — I129 Hypertensive chronic kidney disease with stage 1 through stage 4 chronic kidney disease, or unspecified chronic kidney disease: Secondary | ICD-10-CM | POA: Diagnosis not present

## 2013-12-15 DIAGNOSIS — N189 Chronic kidney disease, unspecified: Secondary | ICD-10-CM | POA: Diagnosis not present

## 2013-12-15 DIAGNOSIS — Z89519 Acquired absence of unspecified leg below knee: Secondary | ICD-10-CM

## 2013-12-15 DIAGNOSIS — L97919 Non-pressure chronic ulcer of unspecified part of right lower leg with unspecified severity: Principal | ICD-10-CM

## 2013-12-15 DIAGNOSIS — Z599 Problem related to housing and economic circumstances, unspecified: Secondary | ICD-10-CM

## 2013-12-15 DIAGNOSIS — Z89512 Acquired absence of left leg below knee: Secondary | ICD-10-CM | POA: Diagnosis not present

## 2013-12-15 DIAGNOSIS — E119 Type 2 diabetes mellitus without complications: Secondary | ICD-10-CM | POA: Diagnosis not present

## 2013-12-15 DIAGNOSIS — I83019 Varicose veins of right lower extremity with ulcer of unspecified site: Secondary | ICD-10-CM

## 2013-12-15 NOTE — Telephone Encounter (Signed)
Rx request to pharmacy/SLS  

## 2013-12-15 NOTE — Telephone Encounter (Signed)
Please Advise

## 2013-12-15 NOTE — Telephone Encounter (Signed)
Referral placed.

## 2013-12-15 NOTE — Telephone Encounter (Signed)
Caller name: Nira Conn Physical therapist with [facility] Relation to pt: Call back number: [phone] Pharmacy:  Reason for call:   Requesting an order for a social worker to go out and see patient. Nira Conn states that patient needs extra assistance.

## 2013-12-16 DIAGNOSIS — N2581 Secondary hyperparathyroidism of renal origin: Secondary | ICD-10-CM | POA: Diagnosis not present

## 2013-12-16 DIAGNOSIS — N186 End stage renal disease: Secondary | ICD-10-CM | POA: Diagnosis not present

## 2013-12-16 DIAGNOSIS — E1165 Type 2 diabetes mellitus with hyperglycemia: Secondary | ICD-10-CM | POA: Diagnosis not present

## 2013-12-16 DIAGNOSIS — E039 Hypothyroidism, unspecified: Secondary | ICD-10-CM | POA: Diagnosis not present

## 2013-12-16 DIAGNOSIS — D631 Anemia in chronic kidney disease: Secondary | ICD-10-CM | POA: Diagnosis not present

## 2013-12-16 LAB — TSH: TSH: 8.89 u[IU]/mL — AB (ref <=5.90)

## 2013-12-16 NOTE — Telephone Encounter (Signed)
LMOM with contact name and number [for return call, if needed] RE: referral placed per provider instructions/SLS

## 2013-12-18 DIAGNOSIS — N2581 Secondary hyperparathyroidism of renal origin: Secondary | ICD-10-CM | POA: Diagnosis not present

## 2013-12-18 DIAGNOSIS — D631 Anemia in chronic kidney disease: Secondary | ICD-10-CM | POA: Diagnosis not present

## 2013-12-18 DIAGNOSIS — N186 End stage renal disease: Secondary | ICD-10-CM | POA: Diagnosis not present

## 2013-12-19 ENCOUNTER — Encounter: Payer: Self-pay | Admitting: Physician Assistant

## 2013-12-19 ENCOUNTER — Ambulatory Visit (INDEPENDENT_AMBULATORY_CARE_PROVIDER_SITE_OTHER): Payer: Medicare Other | Admitting: Physician Assistant

## 2013-12-19 VITALS — BP 114/67 | HR 74 | Temp 98.4°F | Resp 16 | Ht 71.0 in | Wt 245.0 lb

## 2013-12-19 DIAGNOSIS — E785 Hyperlipidemia, unspecified: Secondary | ICD-10-CM

## 2013-12-19 DIAGNOSIS — K851 Biliary acute pancreatitis without necrosis or infection: Secondary | ICD-10-CM

## 2013-12-19 DIAGNOSIS — K8001 Calculus of gallbladder with acute cholecystitis with obstruction: Secondary | ICD-10-CM | POA: Diagnosis not present

## 2013-12-19 NOTE — Patient Instructions (Signed)
Please stop by lab for blood work.  I will call you with your results.  Please continue medications as directed.  Follow-up with [facility] for your dialysis and with Urology for follow-up.  Follow-up will be based on your results.

## 2013-12-19 NOTE — Progress Notes (Signed)
Pre visit review using our clinic review tool, if applicable. No additional management support is needed unless otherwise documented below in the visit note/SLS  

## 2013-12-19 NOTE — Progress Notes (Signed)
Patient presents to clinic today for hospital follow-up for acute pancreatitis, cholecystitis and choledocolithiasis s/p cholecystectomy.  Patient was admitted to the hospital after being seen in ER for severe abdominal pain, nausea and vomiting.  Was diagnosed with pancreatitis, cholecystitis and cholelithiasis.  Patient was placed NPO and IV fluids were given.  Patient then underwent cholecystectomy.  Denies complications from surgery.  Records are not available at time of interview.  They have been requested.    Since discharge, patient states he is doing well.  Denies abdominal pain, nausea or vomiting. Denies fever, chills or malaise.  Is following up with Surgeon on Monday.  Is still seeing Nephrology once monthly for ESRD.  Has dialysis Tuesday, Thursday and Saturday each week. Is having PT twice weekly.    No concerns at today's visit.  Past Medical History  Diagnosis Date  . Sleep apnea     uses cpap  . Hypothyroidism   . Anemia   . Blood transfusion   . Chronic kidney disease     hx of kidney stones  . Hypertension   . Arthritis     osteoarthritis  . Pneumonia   . Diabetes mellitus     insulin dependent  . Peripheral vascular disease   . Hyperlipidemia   . Morbid obesity   . Hypertrophy of prostate   . Acute respiratory failure   . Cellulitis and abscess of leg   . Chronic kidney disease, stage III (moderate)   . Nephrolithiasis   . Anxiety   . Chronic pain   . Embolism and thrombosis of unspecified artery   . DVT (deep venous thrombosis)     Lower Extremity  . Personal history of arthritis     Osteoarthritis  . History of nephrolithiasis     Bilateral  . AR (allergic rhinitis)   . BPH (benign prostatic hypertrophy)   . Displacement of lumbar intervertebral disc without myelopathy     L4-L5  . Cholelithiasis   . Other lymphedema   . Unspecified septicemia   . Impotence of organic origin   . Pancreatitis, acute   . Diverticulosis of colon   . Gallstone      Current Outpatient Prescriptions on File Prior to Visit  Medication Sig Dispense Refill  . atorvastatin (LIPITOR) 40 MG tablet TAKE 1 TABLET EVERY DAY 30 tablet 2  . cyclobenzaprine (FLEXERIL) 5 MG tablet Take 5 mg by mouth 3 times/day as needed-between meals & bedtime.     . docusate sodium 100 MG CAPS Take 100 mg by mouth 2 (two) times daily.    . ferrous sulfate 325 (65 FE) MG tablet Take 325 mg by mouth 2 (two) times daily.     Marland Kitchen glucose blood (BAYER CONTOUR TEST) test strip Use as instructed to check blood sugar 3 - 4 times daily.  Dx 250.00 100 each 3  . HYDROcodone-acetaminophen (NORCO/VICODIN) 5-325 MG per tablet Take 1 tablet by mouth every 6 (six) hours as needed. 30 tablet 0  . insulin aspart (NOVOLOG) 100 UNIT/ML injection INJECT 3 UNITS THREE TIMES DAILY WITH MEALS. AND AT NIGHTIME IF BLOOD SUGARS GREATER THAN 150 20 mL 2  . insulin glargine (LANTUS) 100 UNIT/ML injection PATIENT REPORTS THAT HE TAKES 40 UNITS DAILY. 40 mL 5  . Insulin Syringe-Needle U-100 (INSULIN SYRINGE .5CC/31GX5/16") 31G X 5/16" 0.5 ML MISC USE AS DIRECTED TO INJECT INSULIN FOUR TIMES DAILY Dx: 250.00 120 each 5  . levothyroxine (SYNTHROID, LEVOTHROID) 200 MCG tablet TAKE 1 TABLET (200  MCG TOTAL) BY MOUTH EVERY MORNING. 30 tablet 2  . lidocaine (XYLOCAINE) 2 % jelly Apply topically every 8 (eight) hours as needed (urethral pain). 30 mL 0  . Multiple Vitamins-Minerals (MULTIVITAMINS THER. W/MINERALS) TABS Take 1 tablet by mouth daily.     . sevelamer carbonate (RENVELA) 800 MG tablet Take 800 mg by mouth 3 (three) times daily with meals.    . Skin Protectants, Misc. (DIMETHICONE-ZINC OXIDE) cream Apply topically 2 (two) times daily as needed for dry skin. 142 g 0  . levothyroxine (SYNTHROID, LEVOTHROID) 25 MCG tablet TAKE 1 TABLET (25 MCG TOTAL) BY MOUTH DAILY BEFORE BREAKFAST. 30 tablet 1   No current facility-administered medications on file prior to visit.    No Known Allergies  Family History   Problem Relation Age of Onset  . Diabetes Mother   . Heart attack Mother   . Stroke Mother   . Colon cancer Father   . Dementia Father   . Heart attack Father   . Arthritis Paternal Grandmother   . Hypertension Other   . Hypothyroidism Other     History   Social History  . Marital Status: Married    Spouse Name: N/A    Number of Children: N/A  . Years of Education: N/A   Social History Main Topics  . Smoking status: Never Smoker   . Smokeless tobacco: Never Used  . Alcohol Use: No  . Drug Use: No  . Sexual Activity: Yes   Other Topics Concern  . None   Social History Narrative   Lives with his wife and uses a wheelchair for transfers.     Review of Systems - See HPI.  All other ROS are negative.  BP 114/67 mmHg  Pulse 74  Temp(Src) 98.4 F (36.9 C) (Oral)  Resp 16  Ht 5\' 11"  (1.803 m)  Wt 245 lb (111.131 kg)  BMI 34.19 kg/m2  SpO2 100%  Physical Exam  Constitutional: He is oriented to person, place, and time and well-developed, well-nourished, and in no distress.  HENT:  Head: Normocephalic and atraumatic.  Eyes: Conjunctivae are normal. Pupils are equal, round, and reactive to light.  Neck: Neck supple.  Cardiovascular: Normal rate, regular rhythm, normal heart sounds and intact distal pulses.   Pulmonary/Chest: Effort normal and breath sounds normal. No respiratory distress. He has no wheezes. He has no rales. He exhibits no tenderness.  Abdominal: Soft. Bowel sounds are normal. He exhibits no distension and no mass. There is no tenderness. There is no rebound and no guarding.  Urostomy bag in place.  Draining well.  Musculoskeletal:  L knee s/p BKA.  Evidence of chronic vascular insufficiency with no active ulceration.  Compression sleeve in place.   Lymphadenopathy:    He has no cervical adenopathy.  Neurological: He is alert and oriented to person, place, and time.  Skin: Skin is warm and dry. No rash noted.  Psychiatric: Affect normal.  Vitals  reviewed.  Assessment/Plan: Hyperlipidemia Will send orders to [facility] RN for lipid panel.  Will obtain records from Dialysis and blood work is checked monthly.  Acute biliary pancreatitis Resolved.  Asymptomatic.  Will obtain repeat CBC, BMP and lipase.  Follow-up with GI as scheduled.  Calculus of gallbladder with acute cholecystitis and obstruction S/p cholecystectomy.  Has had follow-up with General Surgery at 2-week post surgery. Has repeat follow-up in 4 weeks. Is also following with GI.  Scars are healing nicely.  Will obtain repeat lipase today giving gallstone pancreatitis.

## 2013-12-20 DIAGNOSIS — Z89512 Acquired absence of left leg below knee: Secondary | ICD-10-CM | POA: Diagnosis not present

## 2013-12-20 DIAGNOSIS — N186 End stage renal disease: Secondary | ICD-10-CM | POA: Diagnosis not present

## 2013-12-20 DIAGNOSIS — E119 Type 2 diabetes mellitus without complications: Secondary | ICD-10-CM | POA: Diagnosis not present

## 2013-12-20 DIAGNOSIS — Z48815 Encounter for surgical aftercare following surgery on the digestive system: Secondary | ICD-10-CM | POA: Diagnosis not present

## 2013-12-20 DIAGNOSIS — D631 Anemia in chronic kidney disease: Secondary | ICD-10-CM | POA: Diagnosis not present

## 2013-12-20 DIAGNOSIS — I129 Hypertensive chronic kidney disease with stage 1 through stage 4 chronic kidney disease, or unspecified chronic kidney disease: Secondary | ICD-10-CM | POA: Diagnosis not present

## 2013-12-20 DIAGNOSIS — N189 Chronic kidney disease, unspecified: Secondary | ICD-10-CM | POA: Diagnosis not present

## 2013-12-20 DIAGNOSIS — N2581 Secondary hyperparathyroidism of renal origin: Secondary | ICD-10-CM | POA: Diagnosis not present

## 2013-12-20 DIAGNOSIS — Z48816 Encounter for surgical aftercare following surgery on the genitourinary system: Secondary | ICD-10-CM | POA: Diagnosis not present

## 2013-12-22 DIAGNOSIS — Z48815 Encounter for surgical aftercare following surgery on the digestive system: Secondary | ICD-10-CM | POA: Diagnosis not present

## 2013-12-22 DIAGNOSIS — I129 Hypertensive chronic kidney disease with stage 1 through stage 4 chronic kidney disease, or unspecified chronic kidney disease: Secondary | ICD-10-CM | POA: Diagnosis not present

## 2013-12-22 DIAGNOSIS — Z48816 Encounter for surgical aftercare following surgery on the genitourinary system: Secondary | ICD-10-CM | POA: Diagnosis not present

## 2013-12-22 DIAGNOSIS — E119 Type 2 diabetes mellitus without complications: Secondary | ICD-10-CM | POA: Diagnosis not present

## 2013-12-22 DIAGNOSIS — Z89512 Acquired absence of left leg below knee: Secondary | ICD-10-CM | POA: Diagnosis not present

## 2013-12-22 DIAGNOSIS — N189 Chronic kidney disease, unspecified: Secondary | ICD-10-CM | POA: Diagnosis not present

## 2013-12-23 DIAGNOSIS — N186 End stage renal disease: Secondary | ICD-10-CM | POA: Diagnosis not present

## 2013-12-23 DIAGNOSIS — D631 Anemia in chronic kidney disease: Secondary | ICD-10-CM | POA: Diagnosis not present

## 2013-12-23 DIAGNOSIS — E785 Hyperlipidemia, unspecified: Secondary | ICD-10-CM | POA: Insufficient documentation

## 2013-12-23 DIAGNOSIS — K851 Biliary acute pancreatitis without necrosis or infection: Secondary | ICD-10-CM | POA: Insufficient documentation

## 2013-12-23 DIAGNOSIS — K8001 Calculus of gallbladder with acute cholecystitis with obstruction: Secondary | ICD-10-CM | POA: Insufficient documentation

## 2013-12-23 DIAGNOSIS — N2581 Secondary hyperparathyroidism of renal origin: Secondary | ICD-10-CM | POA: Diagnosis not present

## 2013-12-23 NOTE — Assessment & Plan Note (Signed)
Will send orders to [facility] for lipid panel.  Will obtain records from Dialysis and blood work is checked monthly.

## 2013-12-23 NOTE — Assessment & Plan Note (Signed)
Resolved.  Asymptomatic.  Will obtain repeat CBC, BMP and lipase.  Follow-up with GI as scheduled.

## 2013-12-24 DIAGNOSIS — E782 Mixed hyperlipidemia: Secondary | ICD-10-CM | POA: Diagnosis not present

## 2013-12-24 DIAGNOSIS — I129 Hypertensive chronic kidney disease with stage 1 through stage 4 chronic kidney disease, or unspecified chronic kidney disease: Secondary | ICD-10-CM | POA: Diagnosis not present

## 2013-12-24 DIAGNOSIS — N189 Chronic kidney disease, unspecified: Secondary | ICD-10-CM | POA: Diagnosis not present

## 2013-12-24 DIAGNOSIS — Z48815 Encounter for surgical aftercare following surgery on the digestive system: Secondary | ICD-10-CM | POA: Diagnosis not present

## 2013-12-24 DIAGNOSIS — K851 Biliary acute pancreatitis: Secondary | ICD-10-CM | POA: Diagnosis not present

## 2013-12-24 DIAGNOSIS — Z48816 Encounter for surgical aftercare following surgery on the genitourinary system: Secondary | ICD-10-CM | POA: Diagnosis not present

## 2013-12-24 DIAGNOSIS — K8001 Calculus of gallbladder with acute cholecystitis with obstruction: Secondary | ICD-10-CM | POA: Diagnosis not present

## 2013-12-24 DIAGNOSIS — Z89512 Acquired absence of left leg below knee: Secondary | ICD-10-CM | POA: Diagnosis not present

## 2013-12-24 DIAGNOSIS — E119 Type 2 diabetes mellitus without complications: Secondary | ICD-10-CM | POA: Diagnosis not present

## 2013-12-24 LAB — LIPID PANEL
Cholesterol: 73 mg/dL (ref 0–200)
HDL: 16 mg/dL — AB (ref 35–70)
LDL CALC: 33 mg/dL
Triglycerides: 118 mg/dL (ref 40–160)

## 2013-12-24 LAB — BASIC METABOLIC PANEL WITH GFR
BUN: 32 mg/dL — AB (ref 4–21)
Creatinine: 4 mg/dL — AB (ref <=1.3)
Glucose: 92 mg/dL
Potassium: 5.3 mmol/L (ref 3.4–5.3)
Sodium: 139 mmol/L (ref 137–147)

## 2013-12-24 LAB — CBC AND DIFFERENTIAL
HEMATOCRIT: 30 % — AB (ref 41–53)
HEMOGLOBIN: 9 g/dL — AB (ref 13.5–17.5)

## 2013-12-24 LAB — HEMOGLOBIN A1C: Hgb A1c MFr Bld: 6.5 % — AB (ref 4.0–6.0)

## 2013-12-24 NOTE — Telephone Encounter (Signed)
Orders Faxed

## 2013-12-25 DIAGNOSIS — D631 Anemia in chronic kidney disease: Secondary | ICD-10-CM | POA: Diagnosis not present

## 2013-12-25 DIAGNOSIS — N2581 Secondary hyperparathyroidism of renal origin: Secondary | ICD-10-CM | POA: Diagnosis not present

## 2013-12-25 DIAGNOSIS — N186 End stage renal disease: Secondary | ICD-10-CM | POA: Diagnosis not present

## 2013-12-26 DIAGNOSIS — Z48815 Encounter for surgical aftercare following surgery on the digestive system: Secondary | ICD-10-CM | POA: Diagnosis not present

## 2013-12-26 DIAGNOSIS — Z89512 Acquired absence of left leg below knee: Secondary | ICD-10-CM | POA: Diagnosis not present

## 2013-12-26 DIAGNOSIS — I129 Hypertensive chronic kidney disease with stage 1 through stage 4 chronic kidney disease, or unspecified chronic kidney disease: Secondary | ICD-10-CM | POA: Diagnosis not present

## 2013-12-26 DIAGNOSIS — E119 Type 2 diabetes mellitus without complications: Secondary | ICD-10-CM | POA: Diagnosis not present

## 2013-12-26 DIAGNOSIS — Z48816 Encounter for surgical aftercare following surgery on the genitourinary system: Secondary | ICD-10-CM | POA: Diagnosis not present

## 2013-12-26 DIAGNOSIS — N189 Chronic kidney disease, unspecified: Secondary | ICD-10-CM | POA: Diagnosis not present

## 2013-12-27 DIAGNOSIS — N186 End stage renal disease: Secondary | ICD-10-CM | POA: Diagnosis not present

## 2013-12-27 DIAGNOSIS — D631 Anemia in chronic kidney disease: Secondary | ICD-10-CM | POA: Diagnosis not present

## 2013-12-27 DIAGNOSIS — N2581 Secondary hyperparathyroidism of renal origin: Secondary | ICD-10-CM | POA: Diagnosis not present

## 2013-12-28 ENCOUNTER — Other Ambulatory Visit: Payer: Self-pay | Admitting: Physician Assistant

## 2013-12-28 DIAGNOSIS — N186 End stage renal disease: Secondary | ICD-10-CM | POA: Diagnosis not present

## 2013-12-28 DIAGNOSIS — D649 Anemia, unspecified: Secondary | ICD-10-CM | POA: Diagnosis not present

## 2013-12-28 DIAGNOSIS — I10 Essential (primary) hypertension: Secondary | ICD-10-CM | POA: Diagnosis not present

## 2013-12-28 NOTE — Assessment & Plan Note (Signed)
S/p cholecystectomy.  Has had follow-up with General Surgery at 2-week post surgery. Has repeat follow-up in 4 weeks. Is also following with GI.  Scars are healing nicely.  Will obtain repeat lipase today giving gallstone pancreatitis.

## 2013-12-29 ENCOUNTER — Telehealth: Payer: Self-pay | Admitting: Physician Assistant

## 2013-12-29 DIAGNOSIS — Z48816 Encounter for surgical aftercare following surgery on the genitourinary system: Secondary | ICD-10-CM | POA: Diagnosis not present

## 2013-12-29 DIAGNOSIS — Z48815 Encounter for surgical aftercare following surgery on the digestive system: Secondary | ICD-10-CM | POA: Diagnosis not present

## 2013-12-29 DIAGNOSIS — N189 Chronic kidney disease, unspecified: Secondary | ICD-10-CM | POA: Diagnosis not present

## 2013-12-29 DIAGNOSIS — E119 Type 2 diabetes mellitus without complications: Secondary | ICD-10-CM | POA: Diagnosis not present

## 2013-12-29 DIAGNOSIS — Z89512 Acquired absence of left leg below knee: Secondary | ICD-10-CM | POA: Diagnosis not present

## 2013-12-29 DIAGNOSIS — I129 Hypertensive chronic kidney disease with stage 1 through stage 4 chronic kidney disease, or unspecified chronic kidney disease: Secondary | ICD-10-CM | POA: Diagnosis not present

## 2013-12-29 MED ORDER — LEVOTHYROXINE SODIUM 25 MCG PO TABS: ORAL_TABLET | ORAL | Status: DC

## 2013-12-29 MED ORDER — ATORVASTATIN CALCIUM 40 MG PO TABS: ORAL_TABLET | ORAL | Status: DC

## 2013-12-29 NOTE — Telephone Encounter (Signed)
Patient informed, understood & agreed; new Rx to pharmacy/SLS

## 2013-12-29 NOTE — Telephone Encounter (Signed)
Please let patient know the lipase looks good.  Pancreatitis completely resolves.  TSH is through the roof.  He needs to restart his thyroid medication.

## 2013-12-29 NOTE — Telephone Encounter (Deleted)
-----   Message from Brunetta Jeans, PA-C sent at 12/28/2013  5:29 PM EST ----- Please call patient to let him know lipase looks great.  Need to reassess HD regimen.  Will assess with [employer].  TSH elevated.  Patient has not been taking Thyroid medication.  Call patient personally to convince him to resume medication.

## 2013-12-30 DIAGNOSIS — D631 Anemia in chronic kidney disease: Secondary | ICD-10-CM | POA: Diagnosis not present

## 2013-12-30 DIAGNOSIS — N186 End stage renal disease: Secondary | ICD-10-CM | POA: Diagnosis not present

## 2013-12-31 DIAGNOSIS — E119 Type 2 diabetes mellitus without complications: Secondary | ICD-10-CM | POA: Diagnosis not present

## 2013-12-31 DIAGNOSIS — I129 Hypertensive chronic kidney disease with stage 1 through stage 4 chronic kidney disease, or unspecified chronic kidney disease: Secondary | ICD-10-CM | POA: Diagnosis not present

## 2013-12-31 DIAGNOSIS — N189 Chronic kidney disease, unspecified: Secondary | ICD-10-CM | POA: Diagnosis not present

## 2013-12-31 DIAGNOSIS — Z48816 Encounter for surgical aftercare following surgery on the genitourinary system: Secondary | ICD-10-CM | POA: Diagnosis not present

## 2013-12-31 DIAGNOSIS — Z48815 Encounter for surgical aftercare following surgery on the digestive system: Secondary | ICD-10-CM | POA: Diagnosis not present

## 2013-12-31 DIAGNOSIS — Z89512 Acquired absence of left leg below knee: Secondary | ICD-10-CM | POA: Diagnosis not present

## 2014-01-01 DIAGNOSIS — N186 End stage renal disease: Secondary | ICD-10-CM | POA: Diagnosis not present

## 2014-01-01 DIAGNOSIS — D631 Anemia in chronic kidney disease: Secondary | ICD-10-CM | POA: Diagnosis not present

## 2014-01-01 DIAGNOSIS — E1165 Type 2 diabetes mellitus with hyperglycemia: Secondary | ICD-10-CM | POA: Diagnosis not present

## 2014-01-03 DIAGNOSIS — D631 Anemia in chronic kidney disease: Secondary | ICD-10-CM | POA: Diagnosis not present

## 2014-01-03 DIAGNOSIS — N186 End stage renal disease: Secondary | ICD-10-CM | POA: Diagnosis not present

## 2014-01-05 DIAGNOSIS — E119 Type 2 diabetes mellitus without complications: Secondary | ICD-10-CM | POA: Diagnosis not present

## 2014-01-05 DIAGNOSIS — I129 Hypertensive chronic kidney disease with stage 1 through stage 4 chronic kidney disease, or unspecified chronic kidney disease: Secondary | ICD-10-CM | POA: Diagnosis not present

## 2014-01-05 DIAGNOSIS — Z89512 Acquired absence of left leg below knee: Secondary | ICD-10-CM | POA: Diagnosis not present

## 2014-01-05 DIAGNOSIS — Z48816 Encounter for surgical aftercare following surgery on the genitourinary system: Secondary | ICD-10-CM | POA: Diagnosis not present

## 2014-01-05 DIAGNOSIS — Z48815 Encounter for surgical aftercare following surgery on the digestive system: Secondary | ICD-10-CM | POA: Diagnosis not present

## 2014-01-05 DIAGNOSIS — N189 Chronic kidney disease, unspecified: Secondary | ICD-10-CM | POA: Diagnosis not present

## 2014-01-06 ENCOUNTER — Telehealth: Payer: Self-pay

## 2014-01-06 ENCOUNTER — Telehealth: Payer: Self-pay | Admitting: Physician Assistant

## 2014-01-06 DIAGNOSIS — D631 Anemia in chronic kidney disease: Secondary | ICD-10-CM | POA: Diagnosis not present

## 2014-01-06 DIAGNOSIS — N186 End stage renal disease: Secondary | ICD-10-CM | POA: Diagnosis not present

## 2014-01-06 NOTE — Telephone Encounter (Signed)
[name]  Lattie [name] called to see if she could get a verbal for Re certification for Disease Management and Wound Care.

## 2014-01-06 NOTE — Telephone Encounter (Signed)
Spoke with [name] at [facility] and gave [facility] for VO requested per provider instructions/SLS

## 2014-01-06 NOTE — Telephone Encounter (Signed)
Caller name: Kojo, Cindrich Relation to pt: self  Call back number: [phone]  Reason for call:  Pt handicap sticker expired requesting a renewal

## 2014-01-06 NOTE — Telephone Encounter (Signed)
Handicap placard is placed at front desk filed under N with the prescriptions.  Please call patient for pickup. Thank you

## 2014-01-07 DIAGNOSIS — N189 Chronic kidney disease, unspecified: Secondary | ICD-10-CM | POA: Diagnosis not present

## 2014-01-07 DIAGNOSIS — Z89512 Acquired absence of left leg below knee: Secondary | ICD-10-CM | POA: Diagnosis not present

## 2014-01-07 DIAGNOSIS — I129 Hypertensive chronic kidney disease with stage 1 through stage 4 chronic kidney disease, or unspecified chronic kidney disease: Secondary | ICD-10-CM | POA: Diagnosis not present

## 2014-01-07 DIAGNOSIS — E119 Type 2 diabetes mellitus without complications: Secondary | ICD-10-CM | POA: Diagnosis not present

## 2014-01-07 DIAGNOSIS — Z48815 Encounter for surgical aftercare following surgery on the digestive system: Secondary | ICD-10-CM | POA: Diagnosis not present

## 2014-01-07 DIAGNOSIS — Z48816 Encounter for surgical aftercare following surgery on the genitourinary system: Secondary | ICD-10-CM | POA: Diagnosis not present

## 2014-01-09 DIAGNOSIS — D631 Anemia in chronic kidney disease: Secondary | ICD-10-CM | POA: Diagnosis not present

## 2014-01-09 DIAGNOSIS — N186 End stage renal disease: Secondary | ICD-10-CM | POA: Diagnosis not present

## 2014-01-09 NOTE — Telephone Encounter (Signed)
lvm advising pt

## 2014-01-09 NOTE — Telephone Encounter (Signed)
Patient called in stating that he cannot pick placard up. Requesting that we mail him the placard.

## 2014-01-10 DIAGNOSIS — D631 Anemia in chronic kidney disease: Secondary | ICD-10-CM | POA: Diagnosis not present

## 2014-01-10 DIAGNOSIS — N186 End stage renal disease: Secondary | ICD-10-CM | POA: Diagnosis not present

## 2014-01-12 ENCOUNTER — Telehealth: Payer: Self-pay | Admitting: Physician Assistant

## 2014-01-12 DIAGNOSIS — Z992 Dependence on renal dialysis: Secondary | ICD-10-CM | POA: Diagnosis not present

## 2014-01-12 DIAGNOSIS — Z89512 Acquired absence of left leg below knee: Secondary | ICD-10-CM | POA: Diagnosis not present

## 2014-01-12 DIAGNOSIS — I129 Hypertensive chronic kidney disease with stage 1 through stage 4 chronic kidney disease, or unspecified chronic kidney disease: Secondary | ICD-10-CM | POA: Diagnosis not present

## 2014-01-12 DIAGNOSIS — N189 Chronic kidney disease, unspecified: Secondary | ICD-10-CM | POA: Diagnosis not present

## 2014-01-12 DIAGNOSIS — E119 Type 2 diabetes mellitus without complications: Secondary | ICD-10-CM | POA: Diagnosis not present

## 2014-01-12 DIAGNOSIS — Z48815 Encounter for surgical aftercare following surgery on the digestive system: Secondary | ICD-10-CM | POA: Diagnosis not present

## 2014-01-12 DIAGNOSIS — Z48816 Encounter for surgical aftercare following surgery on the genitourinary system: Secondary | ICD-10-CM | POA: Diagnosis not present

## 2014-01-12 DIAGNOSIS — N186 End stage renal disease: Secondary | ICD-10-CM | POA: Diagnosis not present

## 2014-01-12 NOTE — Telephone Encounter (Signed)
Ok to extend PT for 2 months.  Please call home care to add this orders.  Also add a TSH.

## 2014-01-12 NOTE — Telephone Encounter (Signed)
Caller name: Lamontae, Glancy Relation to pt: self Call back number: [phone]   Reason for call:  pt states he needs more therapy and requesting orders for [place]. Pt was informed [facility] typically calls requesting orders but he stated he wanted [name] to call [facility] to put orders in.

## 2014-01-12 NOTE — Telephone Encounter (Signed)
Please Advise

## 2014-01-12 NOTE — Telephone Encounter (Signed)
[name] with [facility] given VO to continue PT for patient and draw TSH: hypothyroidism; understood & agreed/SLS

## 2014-01-13 DIAGNOSIS — Z48816 Encounter for surgical aftercare following surgery on the genitourinary system: Secondary | ICD-10-CM | POA: Diagnosis not present

## 2014-01-13 DIAGNOSIS — D631 Anemia in chronic kidney disease: Secondary | ICD-10-CM | POA: Diagnosis not present

## 2014-01-13 DIAGNOSIS — I129 Hypertensive chronic kidney disease with stage 1 through stage 4 chronic kidney disease, or unspecified chronic kidney disease: Secondary | ICD-10-CM | POA: Diagnosis not present

## 2014-01-13 DIAGNOSIS — N189 Chronic kidney disease, unspecified: Secondary | ICD-10-CM | POA: Diagnosis not present

## 2014-01-13 DIAGNOSIS — Z89512 Acquired absence of left leg below knee: Secondary | ICD-10-CM | POA: Diagnosis not present

## 2014-01-13 DIAGNOSIS — Z436 Encounter for attention to other artificial openings of urinary tract: Secondary | ICD-10-CM | POA: Diagnosis not present

## 2014-01-13 DIAGNOSIS — N186 End stage renal disease: Secondary | ICD-10-CM | POA: Diagnosis not present

## 2014-01-13 DIAGNOSIS — Z794 Long term (current) use of insulin: Secondary | ICD-10-CM | POA: Diagnosis not present

## 2014-01-13 DIAGNOSIS — E119 Type 2 diabetes mellitus without complications: Secondary | ICD-10-CM | POA: Diagnosis not present

## 2014-01-13 DIAGNOSIS — Z992 Dependence on renal dialysis: Secondary | ICD-10-CM | POA: Diagnosis not present

## 2014-01-13 DIAGNOSIS — Z48815 Encounter for surgical aftercare following surgery on the digestive system: Secondary | ICD-10-CM | POA: Diagnosis not present

## 2014-01-14 DIAGNOSIS — N289 Disorder of kidney and ureter, unspecified: Secondary | ICD-10-CM | POA: Diagnosis not present

## 2014-01-14 DIAGNOSIS — E1140 Type 2 diabetes mellitus with diabetic neuropathy, unspecified: Secondary | ICD-10-CM | POA: Diagnosis not present

## 2014-01-14 DIAGNOSIS — N189 Chronic kidney disease, unspecified: Secondary | ICD-10-CM | POA: Diagnosis not present

## 2014-01-14 DIAGNOSIS — E039 Hypothyroidism, unspecified: Secondary | ICD-10-CM | POA: Diagnosis not present

## 2014-01-14 DIAGNOSIS — Z48815 Encounter for surgical aftercare following surgery on the digestive system: Secondary | ICD-10-CM | POA: Diagnosis not present

## 2014-01-14 DIAGNOSIS — E119 Type 2 diabetes mellitus without complications: Secondary | ICD-10-CM | POA: Diagnosis not present

## 2014-01-14 DIAGNOSIS — Z794 Long term (current) use of insulin: Secondary | ICD-10-CM | POA: Diagnosis not present

## 2014-01-14 DIAGNOSIS — Z89512 Acquired absence of left leg below knee: Secondary | ICD-10-CM | POA: Diagnosis not present

## 2014-01-14 DIAGNOSIS — I809 Phlebitis and thrombophlebitis of unspecified site: Secondary | ICD-10-CM | POA: Diagnosis not present

## 2014-01-14 DIAGNOSIS — L98499 Non-pressure chronic ulcer of skin of other sites with unspecified severity: Secondary | ICD-10-CM | POA: Diagnosis not present

## 2014-01-14 DIAGNOSIS — M1990 Unspecified osteoarthritis, unspecified site: Secondary | ICD-10-CM | POA: Diagnosis not present

## 2014-01-14 DIAGNOSIS — Z48816 Encounter for surgical aftercare following surgery on the genitourinary system: Secondary | ICD-10-CM | POA: Diagnosis not present

## 2014-01-14 DIAGNOSIS — I129 Hypertensive chronic kidney disease with stage 1 through stage 4 chronic kidney disease, or unspecified chronic kidney disease: Secondary | ICD-10-CM | POA: Diagnosis not present

## 2014-01-14 DIAGNOSIS — N186 End stage renal disease: Secondary | ICD-10-CM | POA: Diagnosis not present

## 2014-01-14 DIAGNOSIS — I872 Venous insufficiency (chronic) (peripheral): Secondary | ICD-10-CM | POA: Diagnosis not present

## 2014-01-15 DIAGNOSIS — N186 End stage renal disease: Secondary | ICD-10-CM | POA: Diagnosis not present

## 2014-01-15 DIAGNOSIS — D631 Anemia in chronic kidney disease: Secondary | ICD-10-CM | POA: Diagnosis not present

## 2014-01-16 DIAGNOSIS — D649 Anemia, unspecified: Secondary | ICD-10-CM | POA: Diagnosis not present

## 2014-01-16 DIAGNOSIS — N186 End stage renal disease: Secondary | ICD-10-CM | POA: Diagnosis not present

## 2014-01-16 DIAGNOSIS — Z992 Dependence on renal dialysis: Secondary | ICD-10-CM | POA: Diagnosis not present

## 2014-01-16 DIAGNOSIS — N189 Chronic kidney disease, unspecified: Secondary | ICD-10-CM | POA: Diagnosis not present

## 2014-01-16 DIAGNOSIS — E119 Type 2 diabetes mellitus without complications: Secondary | ICD-10-CM | POA: Diagnosis not present

## 2014-01-16 DIAGNOSIS — E039 Hypothyroidism, unspecified: Secondary | ICD-10-CM | POA: Diagnosis not present

## 2014-01-16 DIAGNOSIS — I129 Hypertensive chronic kidney disease with stage 1 through stage 4 chronic kidney disease, or unspecified chronic kidney disease: Secondary | ICD-10-CM | POA: Diagnosis not present

## 2014-01-16 DIAGNOSIS — Z79899 Other long term (current) drug therapy: Secondary | ICD-10-CM | POA: Diagnosis not present

## 2014-01-16 DIAGNOSIS — Z794 Long term (current) use of insulin: Secondary | ICD-10-CM | POA: Diagnosis not present

## 2014-01-16 DIAGNOSIS — Z8614 Personal history of Methicillin resistant Staphylococcus aureus infection: Secondary | ICD-10-CM | POA: Diagnosis not present

## 2014-01-16 DIAGNOSIS — Z86718 Personal history of other venous thrombosis and embolism: Secondary | ICD-10-CM | POA: Diagnosis not present

## 2014-01-16 DIAGNOSIS — Z48816 Encounter for surgical aftercare following surgery on the genitourinary system: Secondary | ICD-10-CM | POA: Diagnosis not present

## 2014-01-16 DIAGNOSIS — T82590A Other mechanical complication of surgically created arteriovenous fistula, initial encounter: Secondary | ICD-10-CM | POA: Diagnosis not present

## 2014-01-16 DIAGNOSIS — Z89512 Acquired absence of left leg below knee: Secondary | ICD-10-CM | POA: Diagnosis not present

## 2014-01-16 DIAGNOSIS — K219 Gastro-esophageal reflux disease without esophagitis: Secondary | ICD-10-CM | POA: Diagnosis not present

## 2014-01-16 DIAGNOSIS — I120 Hypertensive chronic kidney disease with stage 5 chronic kidney disease or end stage renal disease: Secondary | ICD-10-CM | POA: Diagnosis not present

## 2014-01-16 DIAGNOSIS — N401 Enlarged prostate with lower urinary tract symptoms: Secondary | ICD-10-CM | POA: Diagnosis not present

## 2014-01-16 DIAGNOSIS — Z48815 Encounter for surgical aftercare following surgery on the digestive system: Secondary | ICD-10-CM | POA: Diagnosis not present

## 2014-01-16 DIAGNOSIS — G4733 Obstructive sleep apnea (adult) (pediatric): Secondary | ICD-10-CM | POA: Diagnosis not present

## 2014-01-17 DIAGNOSIS — N189 Chronic kidney disease, unspecified: Secondary | ICD-10-CM | POA: Diagnosis not present

## 2014-01-17 DIAGNOSIS — D631 Anemia in chronic kidney disease: Secondary | ICD-10-CM | POA: Diagnosis not present

## 2014-01-17 DIAGNOSIS — N186 End stage renal disease: Secondary | ICD-10-CM | POA: Diagnosis not present

## 2014-01-19 DIAGNOSIS — N186 End stage renal disease: Secondary | ICD-10-CM | POA: Diagnosis not present

## 2014-01-19 DIAGNOSIS — K219 Gastro-esophageal reflux disease without esophagitis: Secondary | ICD-10-CM | POA: Diagnosis not present

## 2014-01-19 DIAGNOSIS — T82590A Other mechanical complication of surgically created arteriovenous fistula, initial encounter: Secondary | ICD-10-CM | POA: Diagnosis not present

## 2014-01-19 DIAGNOSIS — D631 Anemia in chronic kidney disease: Secondary | ICD-10-CM | POA: Diagnosis not present

## 2014-01-19 DIAGNOSIS — G4733 Obstructive sleep apnea (adult) (pediatric): Secondary | ICD-10-CM | POA: Diagnosis not present

## 2014-01-19 DIAGNOSIS — Z79899 Other long term (current) drug therapy: Secondary | ICD-10-CM | POA: Diagnosis not present

## 2014-01-19 DIAGNOSIS — I120 Hypertensive chronic kidney disease with stage 5 chronic kidney disease or end stage renal disease: Secondary | ICD-10-CM | POA: Diagnosis not present

## 2014-01-20 DIAGNOSIS — I120 Hypertensive chronic kidney disease with stage 5 chronic kidney disease or end stage renal disease: Secondary | ICD-10-CM | POA: Diagnosis not present

## 2014-01-20 DIAGNOSIS — T82898A Other specified complication of vascular prosthetic devices, implants and grafts, initial encounter: Secondary | ICD-10-CM | POA: Diagnosis not present

## 2014-01-20 DIAGNOSIS — T82590A Other mechanical complication of surgically created arteriovenous fistula, initial encounter: Secondary | ICD-10-CM | POA: Diagnosis not present

## 2014-01-20 DIAGNOSIS — G4733 Obstructive sleep apnea (adult) (pediatric): Secondary | ICD-10-CM | POA: Diagnosis not present

## 2014-01-20 DIAGNOSIS — K219 Gastro-esophageal reflux disease without esophagitis: Secondary | ICD-10-CM | POA: Diagnosis not present

## 2014-01-20 DIAGNOSIS — Z79899 Other long term (current) drug therapy: Secondary | ICD-10-CM | POA: Diagnosis not present

## 2014-01-20 DIAGNOSIS — N186 End stage renal disease: Secondary | ICD-10-CM | POA: Diagnosis not present

## 2014-01-21 ENCOUNTER — Telehealth: Payer: Self-pay | Admitting: Physician Assistant

## 2014-01-21 ENCOUNTER — Telehealth: Payer: Self-pay | Admitting: *Deleted

## 2014-01-21 DIAGNOSIS — D631 Anemia in chronic kidney disease: Secondary | ICD-10-CM | POA: Diagnosis not present

## 2014-01-21 DIAGNOSIS — N186 End stage renal disease: Secondary | ICD-10-CM | POA: Diagnosis not present

## 2014-01-21 LAB — TSH: TSH: 5.94 u[IU]/mL — AB (ref <=5.90)

## 2014-01-21 NOTE — Telephone Encounter (Signed)
Received home health certification and plan of care via fax from [place]. Forms forwarded to Dr. Charlett Blake. JG//CMA

## 2014-01-21 NOTE — Telephone Encounter (Signed)
Repeat TSH result received from [facility].  TSH at 5.94  This is improvement since last check 1 month ago. Continue current regimen. Will repeat TSH in 3 months.

## 2014-01-21 NOTE — Telephone Encounter (Signed)
LMOM with contact name and number for return call RE: results and further provider instructions/SLS  

## 2014-01-22 DIAGNOSIS — N189 Chronic kidney disease, unspecified: Secondary | ICD-10-CM | POA: Diagnosis not present

## 2014-01-22 DIAGNOSIS — Z48815 Encounter for surgical aftercare following surgery on the digestive system: Secondary | ICD-10-CM | POA: Diagnosis not present

## 2014-01-22 DIAGNOSIS — Z48816 Encounter for surgical aftercare following surgery on the genitourinary system: Secondary | ICD-10-CM | POA: Diagnosis not present

## 2014-01-22 DIAGNOSIS — Z89512 Acquired absence of left leg below knee: Secondary | ICD-10-CM | POA: Diagnosis not present

## 2014-01-22 DIAGNOSIS — I129 Hypertensive chronic kidney disease with stage 1 through stage 4 chronic kidney disease, or unspecified chronic kidney disease: Secondary | ICD-10-CM | POA: Diagnosis not present

## 2014-01-22 DIAGNOSIS — E119 Type 2 diabetes mellitus without complications: Secondary | ICD-10-CM | POA: Diagnosis not present

## 2014-01-23 DIAGNOSIS — N189 Chronic kidney disease, unspecified: Secondary | ICD-10-CM | POA: Diagnosis not present

## 2014-01-23 DIAGNOSIS — Z48816 Encounter for surgical aftercare following surgery on the genitourinary system: Secondary | ICD-10-CM | POA: Diagnosis not present

## 2014-01-23 DIAGNOSIS — Z89512 Acquired absence of left leg below knee: Secondary | ICD-10-CM | POA: Diagnosis not present

## 2014-01-23 DIAGNOSIS — Z48815 Encounter for surgical aftercare following surgery on the digestive system: Secondary | ICD-10-CM | POA: Diagnosis not present

## 2014-01-23 DIAGNOSIS — I129 Hypertensive chronic kidney disease with stage 1 through stage 4 chronic kidney disease, or unspecified chronic kidney disease: Secondary | ICD-10-CM | POA: Diagnosis not present

## 2014-01-23 DIAGNOSIS — E119 Type 2 diabetes mellitus without complications: Secondary | ICD-10-CM | POA: Diagnosis not present

## 2014-01-24 DIAGNOSIS — N186 End stage renal disease: Secondary | ICD-10-CM | POA: Diagnosis not present

## 2014-01-24 DIAGNOSIS — D631 Anemia in chronic kidney disease: Secondary | ICD-10-CM | POA: Diagnosis not present

## 2014-01-26 DIAGNOSIS — I129 Hypertensive chronic kidney disease with stage 1 through stage 4 chronic kidney disease, or unspecified chronic kidney disease: Secondary | ICD-10-CM | POA: Diagnosis not present

## 2014-01-26 DIAGNOSIS — N189 Chronic kidney disease, unspecified: Secondary | ICD-10-CM | POA: Diagnosis not present

## 2014-01-26 DIAGNOSIS — Z48815 Encounter for surgical aftercare following surgery on the digestive system: Secondary | ICD-10-CM | POA: Diagnosis not present

## 2014-01-26 DIAGNOSIS — Z48816 Encounter for surgical aftercare following surgery on the genitourinary system: Secondary | ICD-10-CM | POA: Diagnosis not present

## 2014-01-26 DIAGNOSIS — Z89512 Acquired absence of left leg below knee: Secondary | ICD-10-CM | POA: Diagnosis not present

## 2014-01-26 DIAGNOSIS — E119 Type 2 diabetes mellitus without complications: Secondary | ICD-10-CM

## 2014-01-26 NOTE — Telephone Encounter (Signed)
Patient informed, understood & agreed/SLS  

## 2014-01-27 DIAGNOSIS — I10 Essential (primary) hypertension: Secondary | ICD-10-CM | POA: Diagnosis not present

## 2014-01-27 DIAGNOSIS — N186 End stage renal disease: Secondary | ICD-10-CM | POA: Diagnosis not present

## 2014-01-27 DIAGNOSIS — D649 Anemia, unspecified: Secondary | ICD-10-CM | POA: Diagnosis not present

## 2014-01-27 DIAGNOSIS — D631 Anemia in chronic kidney disease: Secondary | ICD-10-CM | POA: Diagnosis not present

## 2014-01-28 DIAGNOSIS — E039 Hypothyroidism, unspecified: Secondary | ICD-10-CM | POA: Diagnosis not present

## 2014-01-28 DIAGNOSIS — Z89512 Acquired absence of left leg below knee: Secondary | ICD-10-CM | POA: Diagnosis not present

## 2014-01-28 DIAGNOSIS — Z48816 Encounter for surgical aftercare following surgery on the genitourinary system: Secondary | ICD-10-CM | POA: Diagnosis not present

## 2014-01-28 DIAGNOSIS — E1140 Type 2 diabetes mellitus with diabetic neuropathy, unspecified: Secondary | ICD-10-CM | POA: Diagnosis not present

## 2014-01-28 DIAGNOSIS — M1990 Unspecified osteoarthritis, unspecified site: Secondary | ICD-10-CM | POA: Diagnosis not present

## 2014-01-28 DIAGNOSIS — L98493 Non-pressure chronic ulcer of skin of other sites with necrosis of muscle: Secondary | ICD-10-CM | POA: Diagnosis not present

## 2014-01-28 DIAGNOSIS — E119 Type 2 diabetes mellitus without complications: Secondary | ICD-10-CM | POA: Diagnosis not present

## 2014-01-28 DIAGNOSIS — I809 Phlebitis and thrombophlebitis of unspecified site: Secondary | ICD-10-CM | POA: Diagnosis not present

## 2014-01-28 DIAGNOSIS — N186 End stage renal disease: Secondary | ICD-10-CM | POA: Diagnosis not present

## 2014-01-28 DIAGNOSIS — Z48815 Encounter for surgical aftercare following surgery on the digestive system: Secondary | ICD-10-CM | POA: Diagnosis not present

## 2014-01-28 DIAGNOSIS — N289 Disorder of kidney and ureter, unspecified: Secondary | ICD-10-CM | POA: Diagnosis not present

## 2014-01-28 DIAGNOSIS — Z794 Long term (current) use of insulin: Secondary | ICD-10-CM | POA: Diagnosis not present

## 2014-01-28 DIAGNOSIS — N189 Chronic kidney disease, unspecified: Secondary | ICD-10-CM | POA: Diagnosis not present

## 2014-01-28 DIAGNOSIS — I129 Hypertensive chronic kidney disease with stage 1 through stage 4 chronic kidney disease, or unspecified chronic kidney disease: Secondary | ICD-10-CM | POA: Diagnosis not present

## 2014-01-28 NOTE — Telephone Encounter (Signed)
Signed paperwork faxed to [place] at [phone]. JG//CMA

## 2014-01-29 DIAGNOSIS — E1165 Type 2 diabetes mellitus with hyperglycemia: Secondary | ICD-10-CM | POA: Diagnosis not present

## 2014-01-29 DIAGNOSIS — N186 End stage renal disease: Secondary | ICD-10-CM | POA: Diagnosis not present

## 2014-01-29 DIAGNOSIS — Z125 Encounter for screening for malignant neoplasm of prostate: Secondary | ICD-10-CM | POA: Diagnosis not present

## 2014-01-29 DIAGNOSIS — Z1159 Encounter for screening for other viral diseases: Secondary | ICD-10-CM | POA: Diagnosis not present

## 2014-01-29 DIAGNOSIS — D631 Anemia in chronic kidney disease: Secondary | ICD-10-CM | POA: Diagnosis not present

## 2014-01-30 DIAGNOSIS — Z89512 Acquired absence of left leg below knee: Secondary | ICD-10-CM | POA: Diagnosis not present

## 2014-01-30 DIAGNOSIS — Z48816 Encounter for surgical aftercare following surgery on the genitourinary system: Secondary | ICD-10-CM | POA: Diagnosis not present

## 2014-01-30 DIAGNOSIS — Z48815 Encounter for surgical aftercare following surgery on the digestive system: Secondary | ICD-10-CM | POA: Diagnosis not present

## 2014-01-30 DIAGNOSIS — I129 Hypertensive chronic kidney disease with stage 1 through stage 4 chronic kidney disease, or unspecified chronic kidney disease: Secondary | ICD-10-CM | POA: Diagnosis not present

## 2014-01-30 DIAGNOSIS — N189 Chronic kidney disease, unspecified: Secondary | ICD-10-CM | POA: Diagnosis not present

## 2014-01-30 DIAGNOSIS — E119 Type 2 diabetes mellitus without complications: Secondary | ICD-10-CM | POA: Diagnosis not present

## 2014-01-31 DIAGNOSIS — D631 Anemia in chronic kidney disease: Secondary | ICD-10-CM | POA: Diagnosis not present

## 2014-01-31 DIAGNOSIS — N186 End stage renal disease: Secondary | ICD-10-CM | POA: Diagnosis not present

## 2014-02-02 DIAGNOSIS — E119 Type 2 diabetes mellitus without complications: Secondary | ICD-10-CM | POA: Diagnosis not present

## 2014-02-02 DIAGNOSIS — Z48816 Encounter for surgical aftercare following surgery on the genitourinary system: Secondary | ICD-10-CM | POA: Diagnosis not present

## 2014-02-02 DIAGNOSIS — N189 Chronic kidney disease, unspecified: Secondary | ICD-10-CM | POA: Diagnosis not present

## 2014-02-02 DIAGNOSIS — Z48815 Encounter for surgical aftercare following surgery on the digestive system: Secondary | ICD-10-CM | POA: Diagnosis not present

## 2014-02-02 DIAGNOSIS — Z89512 Acquired absence of left leg below knee: Secondary | ICD-10-CM | POA: Diagnosis not present

## 2014-02-02 DIAGNOSIS — I129 Hypertensive chronic kidney disease with stage 1 through stage 4 chronic kidney disease, or unspecified chronic kidney disease: Secondary | ICD-10-CM | POA: Diagnosis not present

## 2014-02-03 DIAGNOSIS — N186 End stage renal disease: Secondary | ICD-10-CM | POA: Diagnosis not present

## 2014-02-03 DIAGNOSIS — D631 Anemia in chronic kidney disease: Secondary | ICD-10-CM | POA: Diagnosis not present

## 2014-02-04 DIAGNOSIS — I809 Phlebitis and thrombophlebitis of unspecified site: Secondary | ICD-10-CM | POA: Diagnosis not present

## 2014-02-04 DIAGNOSIS — L98493 Non-pressure chronic ulcer of skin of other sites with necrosis of muscle: Secondary | ICD-10-CM | POA: Diagnosis not present

## 2014-02-04 DIAGNOSIS — E039 Hypothyroidism, unspecified: Secondary | ICD-10-CM | POA: Diagnosis not present

## 2014-02-04 DIAGNOSIS — N186 End stage renal disease: Secondary | ICD-10-CM | POA: Diagnosis not present

## 2014-02-04 DIAGNOSIS — I872 Venous insufficiency (chronic) (peripheral): Secondary | ICD-10-CM | POA: Diagnosis not present

## 2014-02-04 DIAGNOSIS — E1140 Type 2 diabetes mellitus with diabetic neuropathy, unspecified: Secondary | ICD-10-CM | POA: Diagnosis not present

## 2014-02-04 DIAGNOSIS — I879 Disorder of vein, unspecified: Secondary | ICD-10-CM | POA: Diagnosis not present

## 2014-02-04 DIAGNOSIS — I129 Hypertensive chronic kidney disease with stage 1 through stage 4 chronic kidney disease, or unspecified chronic kidney disease: Secondary | ICD-10-CM | POA: Diagnosis not present

## 2014-02-05 DIAGNOSIS — N186 End stage renal disease: Secondary | ICD-10-CM | POA: Diagnosis not present

## 2014-02-05 DIAGNOSIS — D631 Anemia in chronic kidney disease: Secondary | ICD-10-CM | POA: Diagnosis not present

## 2014-02-06 DIAGNOSIS — Z48816 Encounter for surgical aftercare following surgery on the genitourinary system: Secondary | ICD-10-CM | POA: Diagnosis not present

## 2014-02-06 DIAGNOSIS — I129 Hypertensive chronic kidney disease with stage 1 through stage 4 chronic kidney disease, or unspecified chronic kidney disease: Secondary | ICD-10-CM | POA: Diagnosis not present

## 2014-02-06 DIAGNOSIS — N189 Chronic kidney disease, unspecified: Secondary | ICD-10-CM | POA: Diagnosis not present

## 2014-02-06 DIAGNOSIS — Z89512 Acquired absence of left leg below knee: Secondary | ICD-10-CM | POA: Diagnosis not present

## 2014-02-06 DIAGNOSIS — Z48815 Encounter for surgical aftercare following surgery on the digestive system: Secondary | ICD-10-CM | POA: Diagnosis not present

## 2014-02-06 DIAGNOSIS — E119 Type 2 diabetes mellitus without complications: Secondary | ICD-10-CM | POA: Diagnosis not present

## 2014-02-07 DIAGNOSIS — D631 Anemia in chronic kidney disease: Secondary | ICD-10-CM | POA: Diagnosis not present

## 2014-02-07 DIAGNOSIS — N186 End stage renal disease: Secondary | ICD-10-CM | POA: Diagnosis not present

## 2014-02-09 DIAGNOSIS — N189 Chronic kidney disease, unspecified: Secondary | ICD-10-CM | POA: Diagnosis not present

## 2014-02-09 DIAGNOSIS — I129 Hypertensive chronic kidney disease with stage 1 through stage 4 chronic kidney disease, or unspecified chronic kidney disease: Secondary | ICD-10-CM | POA: Diagnosis not present

## 2014-02-09 DIAGNOSIS — Z48816 Encounter for surgical aftercare following surgery on the genitourinary system: Secondary | ICD-10-CM | POA: Diagnosis not present

## 2014-02-09 DIAGNOSIS — Z48815 Encounter for surgical aftercare following surgery on the digestive system: Secondary | ICD-10-CM | POA: Diagnosis not present

## 2014-02-09 DIAGNOSIS — Z89512 Acquired absence of left leg below knee: Secondary | ICD-10-CM | POA: Diagnosis not present

## 2014-02-09 DIAGNOSIS — E119 Type 2 diabetes mellitus without complications: Secondary | ICD-10-CM | POA: Diagnosis not present

## 2014-02-10 DIAGNOSIS — D631 Anemia in chronic kidney disease: Secondary | ICD-10-CM | POA: Diagnosis not present

## 2014-02-10 DIAGNOSIS — N186 End stage renal disease: Secondary | ICD-10-CM | POA: Diagnosis not present

## 2014-02-11 DIAGNOSIS — I129 Hypertensive chronic kidney disease with stage 1 through stage 4 chronic kidney disease, or unspecified chronic kidney disease: Secondary | ICD-10-CM | POA: Diagnosis not present

## 2014-02-11 DIAGNOSIS — N186 End stage renal disease: Secondary | ICD-10-CM | POA: Diagnosis not present

## 2014-02-11 DIAGNOSIS — E039 Hypothyroidism, unspecified: Secondary | ICD-10-CM | POA: Diagnosis not present

## 2014-02-11 DIAGNOSIS — E1140 Type 2 diabetes mellitus with diabetic neuropathy, unspecified: Secondary | ICD-10-CM | POA: Diagnosis not present

## 2014-02-11 DIAGNOSIS — I809 Phlebitis and thrombophlebitis of unspecified site: Secondary | ICD-10-CM | POA: Diagnosis not present

## 2014-02-11 DIAGNOSIS — L98493 Non-pressure chronic ulcer of skin of other sites with necrosis of muscle: Secondary | ICD-10-CM | POA: Diagnosis not present

## 2014-02-12 DIAGNOSIS — D631 Anemia in chronic kidney disease: Secondary | ICD-10-CM | POA: Diagnosis not present

## 2014-02-12 DIAGNOSIS — N186 End stage renal disease: Secondary | ICD-10-CM | POA: Diagnosis not present

## 2014-02-13 ENCOUNTER — Encounter: Payer: Self-pay | Admitting: Family Medicine

## 2014-02-13 DIAGNOSIS — Z48815 Encounter for surgical aftercare following surgery on the digestive system: Secondary | ICD-10-CM | POA: Diagnosis not present

## 2014-02-13 DIAGNOSIS — I129 Hypertensive chronic kidney disease with stage 1 through stage 4 chronic kidney disease, or unspecified chronic kidney disease: Secondary | ICD-10-CM | POA: Diagnosis not present

## 2014-02-13 DIAGNOSIS — E119 Type 2 diabetes mellitus without complications: Secondary | ICD-10-CM | POA: Diagnosis not present

## 2014-02-13 DIAGNOSIS — Z89512 Acquired absence of left leg below knee: Secondary | ICD-10-CM | POA: Diagnosis not present

## 2014-02-13 DIAGNOSIS — Z48816 Encounter for surgical aftercare following surgery on the genitourinary system: Secondary | ICD-10-CM | POA: Diagnosis not present

## 2014-02-13 DIAGNOSIS — N189 Chronic kidney disease, unspecified: Secondary | ICD-10-CM | POA: Diagnosis not present

## 2014-02-14 DIAGNOSIS — N186 End stage renal disease: Secondary | ICD-10-CM | POA: Diagnosis not present

## 2014-02-14 DIAGNOSIS — D631 Anemia in chronic kidney disease: Secondary | ICD-10-CM | POA: Diagnosis not present

## 2014-02-16 DIAGNOSIS — I129 Hypertensive chronic kidney disease with stage 1 through stage 4 chronic kidney disease, or unspecified chronic kidney disease: Secondary | ICD-10-CM | POA: Diagnosis not present

## 2014-02-16 DIAGNOSIS — Z89512 Acquired absence of left leg below knee: Secondary | ICD-10-CM | POA: Diagnosis not present

## 2014-02-16 DIAGNOSIS — N189 Chronic kidney disease, unspecified: Secondary | ICD-10-CM | POA: Diagnosis not present

## 2014-02-16 DIAGNOSIS — Z48816 Encounter for surgical aftercare following surgery on the genitourinary system: Secondary | ICD-10-CM | POA: Diagnosis not present

## 2014-02-16 DIAGNOSIS — Z48815 Encounter for surgical aftercare following surgery on the digestive system: Secondary | ICD-10-CM | POA: Diagnosis not present

## 2014-02-16 DIAGNOSIS — E119 Type 2 diabetes mellitus without complications: Secondary | ICD-10-CM | POA: Diagnosis not present

## 2014-02-17 DIAGNOSIS — D631 Anemia in chronic kidney disease: Secondary | ICD-10-CM | POA: Diagnosis not present

## 2014-02-17 DIAGNOSIS — N186 End stage renal disease: Secondary | ICD-10-CM | POA: Diagnosis not present

## 2014-02-18 DIAGNOSIS — I129 Hypertensive chronic kidney disease with stage 1 through stage 4 chronic kidney disease, or unspecified chronic kidney disease: Secondary | ICD-10-CM | POA: Diagnosis not present

## 2014-02-18 DIAGNOSIS — E119 Type 2 diabetes mellitus without complications: Secondary | ICD-10-CM | POA: Diagnosis not present

## 2014-02-18 DIAGNOSIS — N189 Chronic kidney disease, unspecified: Secondary | ICD-10-CM | POA: Diagnosis not present

## 2014-02-18 DIAGNOSIS — Z89512 Acquired absence of left leg below knee: Secondary | ICD-10-CM | POA: Diagnosis not present

## 2014-02-18 DIAGNOSIS — Z48816 Encounter for surgical aftercare following surgery on the genitourinary system: Secondary | ICD-10-CM | POA: Diagnosis not present

## 2014-02-18 DIAGNOSIS — Z48815 Encounter for surgical aftercare following surgery on the digestive system: Secondary | ICD-10-CM | POA: Diagnosis not present

## 2014-02-19 DIAGNOSIS — Z48815 Encounter for surgical aftercare following surgery on the digestive system: Secondary | ICD-10-CM | POA: Diagnosis not present

## 2014-02-19 DIAGNOSIS — Z48816 Encounter for surgical aftercare following surgery on the genitourinary system: Secondary | ICD-10-CM | POA: Diagnosis not present

## 2014-02-19 DIAGNOSIS — I129 Hypertensive chronic kidney disease with stage 1 through stage 4 chronic kidney disease, or unspecified chronic kidney disease: Secondary | ICD-10-CM | POA: Diagnosis not present

## 2014-02-19 DIAGNOSIS — E119 Type 2 diabetes mellitus without complications: Secondary | ICD-10-CM | POA: Diagnosis not present

## 2014-02-19 DIAGNOSIS — D631 Anemia in chronic kidney disease: Secondary | ICD-10-CM | POA: Diagnosis not present

## 2014-02-19 DIAGNOSIS — N189 Chronic kidney disease, unspecified: Secondary | ICD-10-CM | POA: Diagnosis not present

## 2014-02-19 DIAGNOSIS — Z89512 Acquired absence of left leg below knee: Secondary | ICD-10-CM | POA: Diagnosis not present

## 2014-02-19 DIAGNOSIS — N186 End stage renal disease: Secondary | ICD-10-CM | POA: Diagnosis not present

## 2014-02-21 DIAGNOSIS — I129 Hypertensive chronic kidney disease with stage 1 through stage 4 chronic kidney disease, or unspecified chronic kidney disease: Secondary | ICD-10-CM | POA: Diagnosis not present

## 2014-02-21 DIAGNOSIS — N189 Chronic kidney disease, unspecified: Secondary | ICD-10-CM | POA: Diagnosis not present

## 2014-02-21 DIAGNOSIS — Z89512 Acquired absence of left leg below knee: Secondary | ICD-10-CM | POA: Diagnosis not present

## 2014-02-21 DIAGNOSIS — Z48816 Encounter for surgical aftercare following surgery on the genitourinary system: Secondary | ICD-10-CM | POA: Diagnosis not present

## 2014-02-21 DIAGNOSIS — E119 Type 2 diabetes mellitus without complications: Secondary | ICD-10-CM | POA: Diagnosis not present

## 2014-02-21 DIAGNOSIS — Z48815 Encounter for surgical aftercare following surgery on the digestive system: Secondary | ICD-10-CM | POA: Diagnosis not present

## 2014-02-22 DIAGNOSIS — N186 End stage renal disease: Secondary | ICD-10-CM | POA: Diagnosis not present

## 2014-02-22 DIAGNOSIS — D631 Anemia in chronic kidney disease: Secondary | ICD-10-CM | POA: Diagnosis not present

## 2014-02-23 DIAGNOSIS — Z48815 Encounter for surgical aftercare following surgery on the digestive system: Secondary | ICD-10-CM | POA: Diagnosis not present

## 2014-02-23 DIAGNOSIS — Z89512 Acquired absence of left leg below knee: Secondary | ICD-10-CM | POA: Diagnosis not present

## 2014-02-23 DIAGNOSIS — N189 Chronic kidney disease, unspecified: Secondary | ICD-10-CM | POA: Diagnosis not present

## 2014-02-23 DIAGNOSIS — Z48816 Encounter for surgical aftercare following surgery on the genitourinary system: Secondary | ICD-10-CM | POA: Diagnosis not present

## 2014-02-23 DIAGNOSIS — E119 Type 2 diabetes mellitus without complications: Secondary | ICD-10-CM | POA: Diagnosis not present

## 2014-02-23 DIAGNOSIS — I129 Hypertensive chronic kidney disease with stage 1 through stage 4 chronic kidney disease, or unspecified chronic kidney disease: Secondary | ICD-10-CM | POA: Diagnosis not present

## 2014-02-24 DIAGNOSIS — D631 Anemia in chronic kidney disease: Secondary | ICD-10-CM | POA: Diagnosis not present

## 2014-02-24 DIAGNOSIS — N186 End stage renal disease: Secondary | ICD-10-CM | POA: Diagnosis not present

## 2014-02-25 DIAGNOSIS — L98493 Non-pressure chronic ulcer of skin of other sites with necrosis of muscle: Secondary | ICD-10-CM | POA: Diagnosis not present

## 2014-02-25 DIAGNOSIS — I129 Hypertensive chronic kidney disease with stage 1 through stage 4 chronic kidney disease, or unspecified chronic kidney disease: Secondary | ICD-10-CM | POA: Diagnosis not present

## 2014-02-25 DIAGNOSIS — I809 Phlebitis and thrombophlebitis of unspecified site: Secondary | ICD-10-CM | POA: Diagnosis not present

## 2014-02-25 DIAGNOSIS — E039 Hypothyroidism, unspecified: Secondary | ICD-10-CM | POA: Diagnosis not present

## 2014-02-25 DIAGNOSIS — E1140 Type 2 diabetes mellitus with diabetic neuropathy, unspecified: Secondary | ICD-10-CM | POA: Diagnosis not present

## 2014-02-25 DIAGNOSIS — N186 End stage renal disease: Secondary | ICD-10-CM | POA: Diagnosis not present

## 2014-02-26 DIAGNOSIS — N186 End stage renal disease: Secondary | ICD-10-CM | POA: Diagnosis not present

## 2014-02-26 DIAGNOSIS — N189 Chronic kidney disease, unspecified: Secondary | ICD-10-CM | POA: Diagnosis not present

## 2014-02-26 DIAGNOSIS — E119 Type 2 diabetes mellitus without complications: Secondary | ICD-10-CM | POA: Diagnosis not present

## 2014-02-26 DIAGNOSIS — D631 Anemia in chronic kidney disease: Secondary | ICD-10-CM | POA: Diagnosis not present

## 2014-02-26 DIAGNOSIS — Z48815 Encounter for surgical aftercare following surgery on the digestive system: Secondary | ICD-10-CM | POA: Diagnosis not present

## 2014-02-26 DIAGNOSIS — Z89512 Acquired absence of left leg below knee: Secondary | ICD-10-CM | POA: Diagnosis not present

## 2014-02-26 DIAGNOSIS — Z48816 Encounter for surgical aftercare following surgery on the genitourinary system: Secondary | ICD-10-CM | POA: Diagnosis not present

## 2014-02-26 DIAGNOSIS — I129 Hypertensive chronic kidney disease with stage 1 through stage 4 chronic kidney disease, or unspecified chronic kidney disease: Secondary | ICD-10-CM | POA: Diagnosis not present

## 2014-02-27 DIAGNOSIS — D649 Anemia, unspecified: Secondary | ICD-10-CM | POA: Diagnosis not present

## 2014-02-27 DIAGNOSIS — I129 Hypertensive chronic kidney disease with stage 1 through stage 4 chronic kidney disease, or unspecified chronic kidney disease: Secondary | ICD-10-CM | POA: Diagnosis not present

## 2014-02-27 DIAGNOSIS — I10 Essential (primary) hypertension: Secondary | ICD-10-CM | POA: Diagnosis not present

## 2014-02-27 DIAGNOSIS — E119 Type 2 diabetes mellitus without complications: Secondary | ICD-10-CM | POA: Diagnosis not present

## 2014-02-27 DIAGNOSIS — Z89512 Acquired absence of left leg below knee: Secondary | ICD-10-CM | POA: Diagnosis not present

## 2014-02-27 DIAGNOSIS — Z48816 Encounter for surgical aftercare following surgery on the genitourinary system: Secondary | ICD-10-CM | POA: Diagnosis not present

## 2014-02-27 DIAGNOSIS — Z48815 Encounter for surgical aftercare following surgery on the digestive system: Secondary | ICD-10-CM | POA: Diagnosis not present

## 2014-02-27 DIAGNOSIS — N186 End stage renal disease: Secondary | ICD-10-CM | POA: Diagnosis not present

## 2014-02-27 DIAGNOSIS — N189 Chronic kidney disease, unspecified: Secondary | ICD-10-CM | POA: Diagnosis not present

## 2014-03-01 DIAGNOSIS — D631 Anemia in chronic kidney disease: Secondary | ICD-10-CM | POA: Diagnosis not present

## 2014-03-01 DIAGNOSIS — N186 End stage renal disease: Secondary | ICD-10-CM | POA: Diagnosis not present

## 2014-03-01 DIAGNOSIS — N2581 Secondary hyperparathyroidism of renal origin: Secondary | ICD-10-CM | POA: Diagnosis not present

## 2014-03-02 DIAGNOSIS — E119 Type 2 diabetes mellitus without complications: Secondary | ICD-10-CM | POA: Diagnosis not present

## 2014-03-02 DIAGNOSIS — Z48816 Encounter for surgical aftercare following surgery on the genitourinary system: Secondary | ICD-10-CM | POA: Diagnosis not present

## 2014-03-02 DIAGNOSIS — I129 Hypertensive chronic kidney disease with stage 1 through stage 4 chronic kidney disease, or unspecified chronic kidney disease: Secondary | ICD-10-CM | POA: Diagnosis not present

## 2014-03-02 DIAGNOSIS — Z48815 Encounter for surgical aftercare following surgery on the digestive system: Secondary | ICD-10-CM | POA: Diagnosis not present

## 2014-03-02 DIAGNOSIS — Z89512 Acquired absence of left leg below knee: Secondary | ICD-10-CM | POA: Diagnosis not present

## 2014-03-02 DIAGNOSIS — N189 Chronic kidney disease, unspecified: Secondary | ICD-10-CM | POA: Diagnosis not present

## 2014-03-03 DIAGNOSIS — D631 Anemia in chronic kidney disease: Secondary | ICD-10-CM | POA: Diagnosis not present

## 2014-03-03 DIAGNOSIS — N2581 Secondary hyperparathyroidism of renal origin: Secondary | ICD-10-CM | POA: Diagnosis not present

## 2014-03-03 DIAGNOSIS — N186 End stage renal disease: Secondary | ICD-10-CM | POA: Diagnosis not present

## 2014-03-04 DIAGNOSIS — I129 Hypertensive chronic kidney disease with stage 1 through stage 4 chronic kidney disease, or unspecified chronic kidney disease: Secondary | ICD-10-CM | POA: Diagnosis not present

## 2014-03-04 DIAGNOSIS — E119 Type 2 diabetes mellitus without complications: Secondary | ICD-10-CM | POA: Diagnosis not present

## 2014-03-04 DIAGNOSIS — Z89512 Acquired absence of left leg below knee: Secondary | ICD-10-CM | POA: Diagnosis not present

## 2014-03-04 DIAGNOSIS — Z48815 Encounter for surgical aftercare following surgery on the digestive system: Secondary | ICD-10-CM | POA: Diagnosis not present

## 2014-03-04 DIAGNOSIS — Z48816 Encounter for surgical aftercare following surgery on the genitourinary system: Secondary | ICD-10-CM | POA: Diagnosis not present

## 2014-03-04 DIAGNOSIS — N189 Chronic kidney disease, unspecified: Secondary | ICD-10-CM | POA: Diagnosis not present

## 2014-03-05 DIAGNOSIS — K769 Liver disease, unspecified: Secondary | ICD-10-CM | POA: Diagnosis not present

## 2014-03-05 DIAGNOSIS — D631 Anemia in chronic kidney disease: Secondary | ICD-10-CM | POA: Diagnosis not present

## 2014-03-05 DIAGNOSIS — N2581 Secondary hyperparathyroidism of renal origin: Secondary | ICD-10-CM | POA: Diagnosis not present

## 2014-03-05 DIAGNOSIS — N186 End stage renal disease: Secondary | ICD-10-CM | POA: Diagnosis not present

## 2014-03-05 DIAGNOSIS — E1165 Type 2 diabetes mellitus with hyperglycemia: Secondary | ICD-10-CM | POA: Diagnosis not present

## 2014-03-05 DIAGNOSIS — E039 Hypothyroidism, unspecified: Secondary | ICD-10-CM | POA: Diagnosis not present

## 2014-03-06 DIAGNOSIS — Z48816 Encounter for surgical aftercare following surgery on the genitourinary system: Secondary | ICD-10-CM | POA: Diagnosis not present

## 2014-03-06 DIAGNOSIS — N189 Chronic kidney disease, unspecified: Secondary | ICD-10-CM | POA: Diagnosis not present

## 2014-03-06 DIAGNOSIS — Z48815 Encounter for surgical aftercare following surgery on the digestive system: Secondary | ICD-10-CM | POA: Diagnosis not present

## 2014-03-06 DIAGNOSIS — E119 Type 2 diabetes mellitus without complications: Secondary | ICD-10-CM | POA: Diagnosis not present

## 2014-03-06 DIAGNOSIS — I129 Hypertensive chronic kidney disease with stage 1 through stage 4 chronic kidney disease, or unspecified chronic kidney disease: Secondary | ICD-10-CM | POA: Diagnosis not present

## 2014-03-06 DIAGNOSIS — Z89512 Acquired absence of left leg below knee: Secondary | ICD-10-CM | POA: Diagnosis not present

## 2014-03-07 DIAGNOSIS — N2581 Secondary hyperparathyroidism of renal origin: Secondary | ICD-10-CM | POA: Diagnosis not present

## 2014-03-07 DIAGNOSIS — D631 Anemia in chronic kidney disease: Secondary | ICD-10-CM | POA: Diagnosis not present

## 2014-03-07 DIAGNOSIS — N186 End stage renal disease: Secondary | ICD-10-CM | POA: Diagnosis not present

## 2014-03-09 DIAGNOSIS — N189 Chronic kidney disease, unspecified: Secondary | ICD-10-CM | POA: Diagnosis not present

## 2014-03-09 DIAGNOSIS — I129 Hypertensive chronic kidney disease with stage 1 through stage 4 chronic kidney disease, or unspecified chronic kidney disease: Secondary | ICD-10-CM | POA: Diagnosis not present

## 2014-03-09 DIAGNOSIS — Z48815 Encounter for surgical aftercare following surgery on the digestive system: Secondary | ICD-10-CM | POA: Diagnosis not present

## 2014-03-09 DIAGNOSIS — Z89512 Acquired absence of left leg below knee: Secondary | ICD-10-CM | POA: Diagnosis not present

## 2014-03-09 DIAGNOSIS — Z48816 Encounter for surgical aftercare following surgery on the genitourinary system: Secondary | ICD-10-CM | POA: Diagnosis not present

## 2014-03-09 DIAGNOSIS — E119 Type 2 diabetes mellitus without complications: Secondary | ICD-10-CM | POA: Diagnosis not present

## 2014-03-10 DIAGNOSIS — D631 Anemia in chronic kidney disease: Secondary | ICD-10-CM | POA: Diagnosis not present

## 2014-03-10 DIAGNOSIS — N186 End stage renal disease: Secondary | ICD-10-CM | POA: Diagnosis not present

## 2014-03-10 DIAGNOSIS — N2581 Secondary hyperparathyroidism of renal origin: Secondary | ICD-10-CM | POA: Diagnosis not present

## 2014-03-11 DIAGNOSIS — E1140 Type 2 diabetes mellitus with diabetic neuropathy, unspecified: Secondary | ICD-10-CM | POA: Diagnosis not present

## 2014-03-11 DIAGNOSIS — N289 Disorder of kidney and ureter, unspecified: Secondary | ICD-10-CM | POA: Diagnosis not present

## 2014-03-11 DIAGNOSIS — I129 Hypertensive chronic kidney disease with stage 1 through stage 4 chronic kidney disease, or unspecified chronic kidney disease: Secondary | ICD-10-CM | POA: Diagnosis not present

## 2014-03-11 DIAGNOSIS — I809 Phlebitis and thrombophlebitis of unspecified site: Secondary | ICD-10-CM | POA: Diagnosis not present

## 2014-03-11 DIAGNOSIS — Z794 Long term (current) use of insulin: Secondary | ICD-10-CM | POA: Diagnosis not present

## 2014-03-11 DIAGNOSIS — E039 Hypothyroidism, unspecified: Secondary | ICD-10-CM | POA: Diagnosis not present

## 2014-03-11 DIAGNOSIS — M1990 Unspecified osteoarthritis, unspecified site: Secondary | ICD-10-CM | POA: Diagnosis not present

## 2014-03-11 DIAGNOSIS — L98493 Non-pressure chronic ulcer of skin of other sites with necrosis of muscle: Secondary | ICD-10-CM | POA: Diagnosis not present

## 2014-03-11 DIAGNOSIS — N186 End stage renal disease: Secondary | ICD-10-CM | POA: Diagnosis not present

## 2014-03-12 DIAGNOSIS — N2581 Secondary hyperparathyroidism of renal origin: Secondary | ICD-10-CM | POA: Diagnosis not present

## 2014-03-12 DIAGNOSIS — N186 End stage renal disease: Secondary | ICD-10-CM | POA: Diagnosis not present

## 2014-03-12 DIAGNOSIS — D631 Anemia in chronic kidney disease: Secondary | ICD-10-CM | POA: Diagnosis not present

## 2014-03-13 DIAGNOSIS — N189 Chronic kidney disease, unspecified: Secondary | ICD-10-CM | POA: Diagnosis not present

## 2014-03-13 DIAGNOSIS — Z89512 Acquired absence of left leg below knee: Secondary | ICD-10-CM | POA: Diagnosis not present

## 2014-03-13 DIAGNOSIS — Z48815 Encounter for surgical aftercare following surgery on the digestive system: Secondary | ICD-10-CM | POA: Diagnosis not present

## 2014-03-13 DIAGNOSIS — E119 Type 2 diabetes mellitus without complications: Secondary | ICD-10-CM | POA: Diagnosis not present

## 2014-03-13 DIAGNOSIS — Z48816 Encounter for surgical aftercare following surgery on the genitourinary system: Secondary | ICD-10-CM | POA: Diagnosis not present

## 2014-03-13 DIAGNOSIS — I129 Hypertensive chronic kidney disease with stage 1 through stage 4 chronic kidney disease, or unspecified chronic kidney disease: Secondary | ICD-10-CM | POA: Diagnosis not present

## 2014-03-14 DIAGNOSIS — I129 Hypertensive chronic kidney disease with stage 1 through stage 4 chronic kidney disease, or unspecified chronic kidney disease: Secondary | ICD-10-CM | POA: Diagnosis not present

## 2014-03-14 DIAGNOSIS — N2581 Secondary hyperparathyroidism of renal origin: Secondary | ICD-10-CM | POA: Diagnosis not present

## 2014-03-14 DIAGNOSIS — N189 Chronic kidney disease, unspecified: Secondary | ICD-10-CM | POA: Diagnosis not present

## 2014-03-14 DIAGNOSIS — D631 Anemia in chronic kidney disease: Secondary | ICD-10-CM | POA: Diagnosis not present

## 2014-03-14 DIAGNOSIS — N186 End stage renal disease: Secondary | ICD-10-CM | POA: Diagnosis not present

## 2014-03-14 DIAGNOSIS — Z48816 Encounter for surgical aftercare following surgery on the genitourinary system: Secondary | ICD-10-CM | POA: Diagnosis not present

## 2014-03-14 DIAGNOSIS — E119 Type 2 diabetes mellitus without complications: Secondary | ICD-10-CM | POA: Diagnosis not present

## 2014-03-14 DIAGNOSIS — Z89512 Acquired absence of left leg below knee: Secondary | ICD-10-CM | POA: Diagnosis not present

## 2014-03-14 DIAGNOSIS — Z48815 Encounter for surgical aftercare following surgery on the digestive system: Secondary | ICD-10-CM | POA: Diagnosis not present

## 2014-03-14 DIAGNOSIS — Z436 Encounter for attention to other artificial openings of urinary tract: Secondary | ICD-10-CM | POA: Diagnosis not present

## 2014-03-14 DIAGNOSIS — Z794 Long term (current) use of insulin: Secondary | ICD-10-CM | POA: Diagnosis not present

## 2014-03-14 DIAGNOSIS — Z992 Dependence on renal dialysis: Secondary | ICD-10-CM | POA: Diagnosis not present

## 2014-03-16 DIAGNOSIS — Z89512 Acquired absence of left leg below knee: Secondary | ICD-10-CM | POA: Diagnosis not present

## 2014-03-16 DIAGNOSIS — Z48816 Encounter for surgical aftercare following surgery on the genitourinary system: Secondary | ICD-10-CM | POA: Diagnosis not present

## 2014-03-16 DIAGNOSIS — Z48815 Encounter for surgical aftercare following surgery on the digestive system: Secondary | ICD-10-CM | POA: Diagnosis not present

## 2014-03-16 DIAGNOSIS — E119 Type 2 diabetes mellitus without complications: Secondary | ICD-10-CM | POA: Diagnosis not present

## 2014-03-16 DIAGNOSIS — I129 Hypertensive chronic kidney disease with stage 1 through stage 4 chronic kidney disease, or unspecified chronic kidney disease: Secondary | ICD-10-CM | POA: Diagnosis not present

## 2014-03-16 DIAGNOSIS — N186 End stage renal disease: Secondary | ICD-10-CM | POA: Diagnosis not present

## 2014-03-16 DIAGNOSIS — Z992 Dependence on renal dialysis: Secondary | ICD-10-CM | POA: Diagnosis not present

## 2014-03-16 DIAGNOSIS — N189 Chronic kidney disease, unspecified: Secondary | ICD-10-CM | POA: Diagnosis not present

## 2014-03-17 DIAGNOSIS — N186 End stage renal disease: Secondary | ICD-10-CM | POA: Diagnosis not present

## 2014-03-17 DIAGNOSIS — Z89512 Acquired absence of left leg below knee: Secondary | ICD-10-CM | POA: Diagnosis not present

## 2014-03-17 DIAGNOSIS — E119 Type 2 diabetes mellitus without complications: Secondary | ICD-10-CM | POA: Diagnosis not present

## 2014-03-17 DIAGNOSIS — N189 Chronic kidney disease, unspecified: Secondary | ICD-10-CM | POA: Diagnosis not present

## 2014-03-17 DIAGNOSIS — I129 Hypertensive chronic kidney disease with stage 1 through stage 4 chronic kidney disease, or unspecified chronic kidney disease: Secondary | ICD-10-CM | POA: Diagnosis not present

## 2014-03-17 DIAGNOSIS — N2581 Secondary hyperparathyroidism of renal origin: Secondary | ICD-10-CM | POA: Diagnosis not present

## 2014-03-17 DIAGNOSIS — Z48815 Encounter for surgical aftercare following surgery on the digestive system: Secondary | ICD-10-CM | POA: Diagnosis not present

## 2014-03-17 DIAGNOSIS — Z48816 Encounter for surgical aftercare following surgery on the genitourinary system: Secondary | ICD-10-CM | POA: Diagnosis not present

## 2014-03-17 DIAGNOSIS — D631 Anemia in chronic kidney disease: Secondary | ICD-10-CM | POA: Diagnosis not present

## 2014-03-18 DIAGNOSIS — E119 Type 2 diabetes mellitus without complications: Secondary | ICD-10-CM | POA: Diagnosis not present

## 2014-03-18 DIAGNOSIS — Z48815 Encounter for surgical aftercare following surgery on the digestive system: Secondary | ICD-10-CM | POA: Diagnosis not present

## 2014-03-18 DIAGNOSIS — N189 Chronic kidney disease, unspecified: Secondary | ICD-10-CM | POA: Diagnosis not present

## 2014-03-18 DIAGNOSIS — Z89512 Acquired absence of left leg below knee: Secondary | ICD-10-CM | POA: Diagnosis not present

## 2014-03-18 DIAGNOSIS — I129 Hypertensive chronic kidney disease with stage 1 through stage 4 chronic kidney disease, or unspecified chronic kidney disease: Secondary | ICD-10-CM | POA: Diagnosis not present

## 2014-03-18 DIAGNOSIS — Z48816 Encounter for surgical aftercare following surgery on the genitourinary system: Secondary | ICD-10-CM | POA: Diagnosis not present

## 2014-03-19 DIAGNOSIS — N2581 Secondary hyperparathyroidism of renal origin: Secondary | ICD-10-CM | POA: Diagnosis not present

## 2014-03-19 DIAGNOSIS — N186 End stage renal disease: Secondary | ICD-10-CM | POA: Diagnosis not present

## 2014-03-19 DIAGNOSIS — D631 Anemia in chronic kidney disease: Secondary | ICD-10-CM | POA: Diagnosis not present

## 2014-03-20 ENCOUNTER — Other Ambulatory Visit: Payer: Self-pay | Admitting: Physician Assistant

## 2014-03-20 DIAGNOSIS — Z48816 Encounter for surgical aftercare following surgery on the genitourinary system: Secondary | ICD-10-CM | POA: Diagnosis not present

## 2014-03-20 DIAGNOSIS — I129 Hypertensive chronic kidney disease with stage 1 through stage 4 chronic kidney disease, or unspecified chronic kidney disease: Secondary | ICD-10-CM | POA: Diagnosis not present

## 2014-03-20 DIAGNOSIS — E119 Type 2 diabetes mellitus without complications: Secondary | ICD-10-CM | POA: Diagnosis not present

## 2014-03-20 DIAGNOSIS — N189 Chronic kidney disease, unspecified: Secondary | ICD-10-CM | POA: Diagnosis not present

## 2014-03-20 DIAGNOSIS — Z89512 Acquired absence of left leg below knee: Secondary | ICD-10-CM | POA: Diagnosis not present

## 2014-03-20 DIAGNOSIS — Z48815 Encounter for surgical aftercare following surgery on the digestive system: Secondary | ICD-10-CM | POA: Diagnosis not present

## 2014-03-23 DIAGNOSIS — Z48816 Encounter for surgical aftercare following surgery on the genitourinary system: Secondary | ICD-10-CM | POA: Diagnosis not present

## 2014-03-23 DIAGNOSIS — N186 End stage renal disease: Secondary | ICD-10-CM | POA: Diagnosis not present

## 2014-03-23 DIAGNOSIS — Z89512 Acquired absence of left leg below knee: Secondary | ICD-10-CM | POA: Diagnosis not present

## 2014-03-23 DIAGNOSIS — I129 Hypertensive chronic kidney disease with stage 1 through stage 4 chronic kidney disease, or unspecified chronic kidney disease: Secondary | ICD-10-CM | POA: Diagnosis not present

## 2014-03-23 DIAGNOSIS — E119 Type 2 diabetes mellitus without complications: Secondary | ICD-10-CM | POA: Diagnosis not present

## 2014-03-23 DIAGNOSIS — N189 Chronic kidney disease, unspecified: Secondary | ICD-10-CM | POA: Diagnosis not present

## 2014-03-23 DIAGNOSIS — N2581 Secondary hyperparathyroidism of renal origin: Secondary | ICD-10-CM | POA: Diagnosis not present

## 2014-03-23 DIAGNOSIS — Z48815 Encounter for surgical aftercare following surgery on the digestive system: Secondary | ICD-10-CM | POA: Diagnosis not present

## 2014-03-23 DIAGNOSIS — D631 Anemia in chronic kidney disease: Secondary | ICD-10-CM | POA: Diagnosis not present

## 2014-03-23 NOTE — Telephone Encounter (Signed)
Medication Detail      Disp Refills Start End     levothyroxine (SYNTHROID, LEVOTHROID) 200 MCG tablet 30 tablet 2 12/15/2013     Sig: TAKE 1 TABLET (200 MCG TOTAL) BY MOUTH EVERY MORNING.    E-Prescribing Status: Receipt confirmed by pharmacy (12/15/2013 8:24 AM EDT)    Medication Detail      Disp Refills Start End     levothyroxine (SYNTHROID, LEVOTHROID) 25 MCG tablet 30 tablet 2 12/29/2013     Sig: TAKE 1 TABLET (25 MCG TOTAL) BY MOUTH DAILY BEFORE BREAKFAST.    E-Prescribing Status: Receipt confirmed by pharmacy (12/29/2013 4:00 PM EST    Rx request to pharmacy/SLS

## 2014-03-25 DIAGNOSIS — N189 Chronic kidney disease, unspecified: Secondary | ICD-10-CM | POA: Diagnosis not present

## 2014-03-25 DIAGNOSIS — N186 End stage renal disease: Secondary | ICD-10-CM | POA: Diagnosis not present

## 2014-03-25 DIAGNOSIS — E119 Type 2 diabetes mellitus without complications: Secondary | ICD-10-CM | POA: Diagnosis not present

## 2014-03-25 DIAGNOSIS — I129 Hypertensive chronic kidney disease with stage 1 through stage 4 chronic kidney disease, or unspecified chronic kidney disease: Secondary | ICD-10-CM | POA: Diagnosis not present

## 2014-03-25 DIAGNOSIS — Z48815 Encounter for surgical aftercare following surgery on the digestive system: Secondary | ICD-10-CM | POA: Diagnosis not present

## 2014-03-25 DIAGNOSIS — I809 Phlebitis and thrombophlebitis of unspecified site: Secondary | ICD-10-CM | POA: Diagnosis not present

## 2014-03-25 DIAGNOSIS — L98493 Non-pressure chronic ulcer of skin of other sites with necrosis of muscle: Secondary | ICD-10-CM | POA: Diagnosis not present

## 2014-03-25 DIAGNOSIS — Z89512 Acquired absence of left leg below knee: Secondary | ICD-10-CM | POA: Diagnosis not present

## 2014-03-25 DIAGNOSIS — Z48816 Encounter for surgical aftercare following surgery on the genitourinary system: Secondary | ICD-10-CM | POA: Diagnosis not present

## 2014-03-25 DIAGNOSIS — E039 Hypothyroidism, unspecified: Secondary | ICD-10-CM | POA: Diagnosis not present

## 2014-03-25 DIAGNOSIS — E1140 Type 2 diabetes mellitus with diabetic neuropathy, unspecified: Secondary | ICD-10-CM | POA: Diagnosis not present

## 2014-03-26 DIAGNOSIS — N2581 Secondary hyperparathyroidism of renal origin: Secondary | ICD-10-CM | POA: Diagnosis not present

## 2014-03-26 DIAGNOSIS — D631 Anemia in chronic kidney disease: Secondary | ICD-10-CM | POA: Diagnosis not present

## 2014-03-26 DIAGNOSIS — N186 End stage renal disease: Secondary | ICD-10-CM | POA: Diagnosis not present

## 2014-03-27 DIAGNOSIS — Z48816 Encounter for surgical aftercare following surgery on the genitourinary system: Secondary | ICD-10-CM | POA: Diagnosis not present

## 2014-03-27 DIAGNOSIS — Z48815 Encounter for surgical aftercare following surgery on the digestive system: Secondary | ICD-10-CM | POA: Diagnosis not present

## 2014-03-27 DIAGNOSIS — I129 Hypertensive chronic kidney disease with stage 1 through stage 4 chronic kidney disease, or unspecified chronic kidney disease: Secondary | ICD-10-CM | POA: Diagnosis not present

## 2014-03-27 DIAGNOSIS — Z89512 Acquired absence of left leg below knee: Secondary | ICD-10-CM | POA: Diagnosis not present

## 2014-03-27 DIAGNOSIS — N189 Chronic kidney disease, unspecified: Secondary | ICD-10-CM | POA: Diagnosis not present

## 2014-03-27 DIAGNOSIS — E119 Type 2 diabetes mellitus without complications: Secondary | ICD-10-CM | POA: Diagnosis not present

## 2014-03-28 DIAGNOSIS — D631 Anemia in chronic kidney disease: Secondary | ICD-10-CM | POA: Diagnosis not present

## 2014-03-28 DIAGNOSIS — N186 End stage renal disease: Secondary | ICD-10-CM | POA: Diagnosis not present

## 2014-03-28 DIAGNOSIS — N2581 Secondary hyperparathyroidism of renal origin: Secondary | ICD-10-CM | POA: Diagnosis not present

## 2014-03-30 DIAGNOSIS — Z89512 Acquired absence of left leg below knee: Secondary | ICD-10-CM | POA: Diagnosis not present

## 2014-03-30 DIAGNOSIS — I129 Hypertensive chronic kidney disease with stage 1 through stage 4 chronic kidney disease, or unspecified chronic kidney disease: Secondary | ICD-10-CM | POA: Diagnosis not present

## 2014-03-30 DIAGNOSIS — I10 Essential (primary) hypertension: Secondary | ICD-10-CM | POA: Diagnosis not present

## 2014-03-30 DIAGNOSIS — N186 End stage renal disease: Secondary | ICD-10-CM | POA: Diagnosis not present

## 2014-03-30 DIAGNOSIS — D649 Anemia, unspecified: Secondary | ICD-10-CM | POA: Diagnosis not present

## 2014-03-30 DIAGNOSIS — E119 Type 2 diabetes mellitus without complications: Secondary | ICD-10-CM | POA: Diagnosis not present

## 2014-03-30 DIAGNOSIS — Z48816 Encounter for surgical aftercare following surgery on the genitourinary system: Secondary | ICD-10-CM | POA: Diagnosis not present

## 2014-03-30 DIAGNOSIS — N189 Chronic kidney disease, unspecified: Secondary | ICD-10-CM | POA: Diagnosis not present

## 2014-03-30 DIAGNOSIS — Z48815 Encounter for surgical aftercare following surgery on the digestive system: Secondary | ICD-10-CM | POA: Diagnosis not present

## 2014-03-31 DIAGNOSIS — N186 End stage renal disease: Secondary | ICD-10-CM | POA: Diagnosis not present

## 2014-03-31 DIAGNOSIS — Z23 Encounter for immunization: Secondary | ICD-10-CM | POA: Diagnosis not present

## 2014-03-31 DIAGNOSIS — D509 Iron deficiency anemia, unspecified: Secondary | ICD-10-CM | POA: Diagnosis not present

## 2014-03-31 DIAGNOSIS — D631 Anemia in chronic kidney disease: Secondary | ICD-10-CM | POA: Diagnosis not present

## 2014-04-01 DIAGNOSIS — E119 Type 2 diabetes mellitus without complications: Secondary | ICD-10-CM | POA: Diagnosis not present

## 2014-04-01 DIAGNOSIS — Z89512 Acquired absence of left leg below knee: Secondary | ICD-10-CM | POA: Diagnosis not present

## 2014-04-01 DIAGNOSIS — I129 Hypertensive chronic kidney disease with stage 1 through stage 4 chronic kidney disease, or unspecified chronic kidney disease: Secondary | ICD-10-CM | POA: Diagnosis not present

## 2014-04-01 DIAGNOSIS — N189 Chronic kidney disease, unspecified: Secondary | ICD-10-CM | POA: Diagnosis not present

## 2014-04-01 DIAGNOSIS — Z48815 Encounter for surgical aftercare following surgery on the digestive system: Secondary | ICD-10-CM | POA: Diagnosis not present

## 2014-04-01 DIAGNOSIS — Z48816 Encounter for surgical aftercare following surgery on the genitourinary system: Secondary | ICD-10-CM | POA: Diagnosis not present

## 2014-04-02 DIAGNOSIS — K769 Liver disease, unspecified: Secondary | ICD-10-CM | POA: Diagnosis not present

## 2014-04-02 DIAGNOSIS — E1165 Type 2 diabetes mellitus with hyperglycemia: Secondary | ICD-10-CM | POA: Diagnosis not present

## 2014-04-02 DIAGNOSIS — D509 Iron deficiency anemia, unspecified: Secondary | ICD-10-CM | POA: Diagnosis not present

## 2014-04-02 DIAGNOSIS — D631 Anemia in chronic kidney disease: Secondary | ICD-10-CM | POA: Diagnosis not present

## 2014-04-02 DIAGNOSIS — N186 End stage renal disease: Secondary | ICD-10-CM | POA: Diagnosis not present

## 2014-04-02 DIAGNOSIS — Z23 Encounter for immunization: Secondary | ICD-10-CM | POA: Diagnosis not present

## 2014-04-03 DIAGNOSIS — N189 Chronic kidney disease, unspecified: Secondary | ICD-10-CM | POA: Diagnosis not present

## 2014-04-03 DIAGNOSIS — Z48816 Encounter for surgical aftercare following surgery on the genitourinary system: Secondary | ICD-10-CM | POA: Diagnosis not present

## 2014-04-03 DIAGNOSIS — Z48815 Encounter for surgical aftercare following surgery on the digestive system: Secondary | ICD-10-CM | POA: Diagnosis not present

## 2014-04-03 DIAGNOSIS — E119 Type 2 diabetes mellitus without complications: Secondary | ICD-10-CM | POA: Diagnosis not present

## 2014-04-03 DIAGNOSIS — I129 Hypertensive chronic kidney disease with stage 1 through stage 4 chronic kidney disease, or unspecified chronic kidney disease: Secondary | ICD-10-CM | POA: Diagnosis not present

## 2014-04-03 DIAGNOSIS — Z89512 Acquired absence of left leg below knee: Secondary | ICD-10-CM | POA: Diagnosis not present

## 2014-04-04 DIAGNOSIS — Z23 Encounter for immunization: Secondary | ICD-10-CM | POA: Diagnosis not present

## 2014-04-04 DIAGNOSIS — N186 End stage renal disease: Secondary | ICD-10-CM | POA: Diagnosis not present

## 2014-04-04 DIAGNOSIS — D509 Iron deficiency anemia, unspecified: Secondary | ICD-10-CM | POA: Diagnosis not present

## 2014-04-04 DIAGNOSIS — D631 Anemia in chronic kidney disease: Secondary | ICD-10-CM | POA: Diagnosis not present

## 2014-04-06 DIAGNOSIS — N200 Calculus of kidney: Secondary | ICD-10-CM | POA: Diagnosis not present

## 2014-04-06 DIAGNOSIS — E1169 Type 2 diabetes mellitus with other specified complication: Secondary | ICD-10-CM | POA: Diagnosis not present

## 2014-04-06 DIAGNOSIS — I10 Essential (primary) hypertension: Secondary | ICD-10-CM | POA: Diagnosis not present

## 2014-04-06 DIAGNOSIS — Z48815 Encounter for surgical aftercare following surgery on the digestive system: Secondary | ICD-10-CM | POA: Diagnosis not present

## 2014-04-06 DIAGNOSIS — Z794 Long term (current) use of insulin: Secondary | ICD-10-CM | POA: Diagnosis not present

## 2014-04-06 DIAGNOSIS — N189 Chronic kidney disease, unspecified: Secondary | ICD-10-CM | POA: Diagnosis not present

## 2014-04-06 DIAGNOSIS — N3010 Interstitial cystitis (chronic) without hematuria: Secondary | ICD-10-CM | POA: Diagnosis not present

## 2014-04-06 DIAGNOSIS — E119 Type 2 diabetes mellitus without complications: Secondary | ICD-10-CM | POA: Diagnosis not present

## 2014-04-06 DIAGNOSIS — I129 Hypertensive chronic kidney disease with stage 1 through stage 4 chronic kidney disease, or unspecified chronic kidney disease: Secondary | ICD-10-CM | POA: Diagnosis not present

## 2014-04-06 DIAGNOSIS — Z89512 Acquired absence of left leg below knee: Secondary | ICD-10-CM | POA: Diagnosis not present

## 2014-04-06 DIAGNOSIS — Z436 Encounter for attention to other artificial openings of urinary tract: Secondary | ICD-10-CM | POA: Diagnosis not present

## 2014-04-06 DIAGNOSIS — N179 Acute kidney failure, unspecified: Secondary | ICD-10-CM | POA: Diagnosis not present

## 2014-04-06 DIAGNOSIS — Z48816 Encounter for surgical aftercare following surgery on the genitourinary system: Secondary | ICD-10-CM | POA: Diagnosis not present

## 2014-04-07 ENCOUNTER — Other Ambulatory Visit: Payer: Self-pay | Admitting: Physician Assistant

## 2014-04-07 DIAGNOSIS — N186 End stage renal disease: Secondary | ICD-10-CM | POA: Diagnosis not present

## 2014-04-07 DIAGNOSIS — D631 Anemia in chronic kidney disease: Secondary | ICD-10-CM | POA: Diagnosis not present

## 2014-04-07 DIAGNOSIS — Z23 Encounter for immunization: Secondary | ICD-10-CM | POA: Diagnosis not present

## 2014-04-07 DIAGNOSIS — D509 Iron deficiency anemia, unspecified: Secondary | ICD-10-CM | POA: Diagnosis not present

## 2014-04-08 DIAGNOSIS — L98493 Non-pressure chronic ulcer of skin of other sites with necrosis of muscle: Secondary | ICD-10-CM | POA: Diagnosis not present

## 2014-04-08 DIAGNOSIS — N186 End stage renal disease: Secondary | ICD-10-CM | POA: Diagnosis not present

## 2014-04-08 DIAGNOSIS — I129 Hypertensive chronic kidney disease with stage 1 through stage 4 chronic kidney disease, or unspecified chronic kidney disease: Secondary | ICD-10-CM | POA: Diagnosis not present

## 2014-04-08 DIAGNOSIS — I809 Phlebitis and thrombophlebitis of unspecified site: Secondary | ICD-10-CM | POA: Diagnosis not present

## 2014-04-08 DIAGNOSIS — M1990 Unspecified osteoarthritis, unspecified site: Secondary | ICD-10-CM | POA: Diagnosis not present

## 2014-04-08 DIAGNOSIS — N289 Disorder of kidney and ureter, unspecified: Secondary | ICD-10-CM | POA: Diagnosis not present

## 2014-04-08 DIAGNOSIS — Z794 Long term (current) use of insulin: Secondary | ICD-10-CM | POA: Diagnosis not present

## 2014-04-08 DIAGNOSIS — E1140 Type 2 diabetes mellitus with diabetic neuropathy, unspecified: Secondary | ICD-10-CM | POA: Diagnosis not present

## 2014-04-08 DIAGNOSIS — E039 Hypothyroidism, unspecified: Secondary | ICD-10-CM | POA: Diagnosis not present

## 2014-04-08 DIAGNOSIS — I872 Venous insufficiency (chronic) (peripheral): Secondary | ICD-10-CM | POA: Diagnosis not present

## 2014-04-09 DIAGNOSIS — D509 Iron deficiency anemia, unspecified: Secondary | ICD-10-CM | POA: Diagnosis not present

## 2014-04-09 DIAGNOSIS — N186 End stage renal disease: Secondary | ICD-10-CM | POA: Diagnosis not present

## 2014-04-09 DIAGNOSIS — Z23 Encounter for immunization: Secondary | ICD-10-CM | POA: Diagnosis not present

## 2014-04-09 DIAGNOSIS — D631 Anemia in chronic kidney disease: Secondary | ICD-10-CM | POA: Diagnosis not present

## 2014-04-10 DIAGNOSIS — N189 Chronic kidney disease, unspecified: Secondary | ICD-10-CM | POA: Diagnosis not present

## 2014-04-10 DIAGNOSIS — E119 Type 2 diabetes mellitus without complications: Secondary | ICD-10-CM | POA: Diagnosis not present

## 2014-04-10 DIAGNOSIS — Z48816 Encounter for surgical aftercare following surgery on the genitourinary system: Secondary | ICD-10-CM | POA: Diagnosis not present

## 2014-04-10 DIAGNOSIS — Z89512 Acquired absence of left leg below knee: Secondary | ICD-10-CM | POA: Diagnosis not present

## 2014-04-10 DIAGNOSIS — I129 Hypertensive chronic kidney disease with stage 1 through stage 4 chronic kidney disease, or unspecified chronic kidney disease: Secondary | ICD-10-CM | POA: Diagnosis not present

## 2014-04-10 DIAGNOSIS — Z48815 Encounter for surgical aftercare following surgery on the digestive system: Secondary | ICD-10-CM | POA: Diagnosis not present

## 2014-04-11 DIAGNOSIS — D631 Anemia in chronic kidney disease: Secondary | ICD-10-CM | POA: Diagnosis not present

## 2014-04-11 DIAGNOSIS — N186 End stage renal disease: Secondary | ICD-10-CM | POA: Diagnosis not present

## 2014-04-11 DIAGNOSIS — Z23 Encounter for immunization: Secondary | ICD-10-CM | POA: Diagnosis not present

## 2014-04-11 DIAGNOSIS — D509 Iron deficiency anemia, unspecified: Secondary | ICD-10-CM | POA: Diagnosis not present

## 2014-04-12 DIAGNOSIS — N189 Chronic kidney disease, unspecified: Secondary | ICD-10-CM | POA: Diagnosis not present

## 2014-04-12 DIAGNOSIS — Z89512 Acquired absence of left leg below knee: Secondary | ICD-10-CM | POA: Diagnosis not present

## 2014-04-12 DIAGNOSIS — E119 Type 2 diabetes mellitus without complications: Secondary | ICD-10-CM | POA: Diagnosis not present

## 2014-04-12 DIAGNOSIS — Z48815 Encounter for surgical aftercare following surgery on the digestive system: Secondary | ICD-10-CM | POA: Diagnosis not present

## 2014-04-12 DIAGNOSIS — Z48816 Encounter for surgical aftercare following surgery on the genitourinary system: Secondary | ICD-10-CM | POA: Diagnosis not present

## 2014-04-12 DIAGNOSIS — I129 Hypertensive chronic kidney disease with stage 1 through stage 4 chronic kidney disease, or unspecified chronic kidney disease: Secondary | ICD-10-CM | POA: Diagnosis not present

## 2014-04-13 DIAGNOSIS — N189 Chronic kidney disease, unspecified: Secondary | ICD-10-CM | POA: Diagnosis not present

## 2014-04-13 DIAGNOSIS — I129 Hypertensive chronic kidney disease with stage 1 through stage 4 chronic kidney disease, or unspecified chronic kidney disease: Secondary | ICD-10-CM | POA: Diagnosis not present

## 2014-04-13 DIAGNOSIS — Z89512 Acquired absence of left leg below knee: Secondary | ICD-10-CM | POA: Diagnosis not present

## 2014-04-13 DIAGNOSIS — Z48816 Encounter for surgical aftercare following surgery on the genitourinary system: Secondary | ICD-10-CM | POA: Diagnosis not present

## 2014-04-13 DIAGNOSIS — Z48815 Encounter for surgical aftercare following surgery on the digestive system: Secondary | ICD-10-CM | POA: Diagnosis not present

## 2014-04-13 DIAGNOSIS — E119 Type 2 diabetes mellitus without complications: Secondary | ICD-10-CM | POA: Diagnosis not present

## 2014-04-14 DIAGNOSIS — D509 Iron deficiency anemia, unspecified: Secondary | ICD-10-CM | POA: Diagnosis not present

## 2014-04-14 DIAGNOSIS — D631 Anemia in chronic kidney disease: Secondary | ICD-10-CM | POA: Diagnosis not present

## 2014-04-14 DIAGNOSIS — Z23 Encounter for immunization: Secondary | ICD-10-CM | POA: Diagnosis not present

## 2014-04-14 DIAGNOSIS — N186 End stage renal disease: Secondary | ICD-10-CM | POA: Diagnosis not present

## 2014-04-15 DIAGNOSIS — I129 Hypertensive chronic kidney disease with stage 1 through stage 4 chronic kidney disease, or unspecified chronic kidney disease: Secondary | ICD-10-CM | POA: Diagnosis not present

## 2014-04-15 DIAGNOSIS — Z48816 Encounter for surgical aftercare following surgery on the genitourinary system: Secondary | ICD-10-CM | POA: Diagnosis not present

## 2014-04-15 DIAGNOSIS — Z89512 Acquired absence of left leg below knee: Secondary | ICD-10-CM | POA: Diagnosis not present

## 2014-04-15 DIAGNOSIS — Z48815 Encounter for surgical aftercare following surgery on the digestive system: Secondary | ICD-10-CM | POA: Diagnosis not present

## 2014-04-15 DIAGNOSIS — E119 Type 2 diabetes mellitus without complications: Secondary | ICD-10-CM | POA: Diagnosis not present

## 2014-04-15 DIAGNOSIS — N189 Chronic kidney disease, unspecified: Secondary | ICD-10-CM | POA: Diagnosis not present

## 2014-04-16 DIAGNOSIS — Z23 Encounter for immunization: Secondary | ICD-10-CM | POA: Diagnosis not present

## 2014-04-16 DIAGNOSIS — D509 Iron deficiency anemia, unspecified: Secondary | ICD-10-CM | POA: Diagnosis not present

## 2014-04-16 DIAGNOSIS — N186 End stage renal disease: Secondary | ICD-10-CM | POA: Diagnosis not present

## 2014-04-16 DIAGNOSIS — D631 Anemia in chronic kidney disease: Secondary | ICD-10-CM | POA: Diagnosis not present

## 2014-04-17 ENCOUNTER — Other Ambulatory Visit: Payer: Self-pay | Admitting: Physician Assistant

## 2014-04-17 DIAGNOSIS — N189 Chronic kidney disease, unspecified: Secondary | ICD-10-CM | POA: Diagnosis not present

## 2014-04-17 DIAGNOSIS — Z89512 Acquired absence of left leg below knee: Secondary | ICD-10-CM | POA: Diagnosis not present

## 2014-04-17 DIAGNOSIS — E119 Type 2 diabetes mellitus without complications: Secondary | ICD-10-CM | POA: Diagnosis not present

## 2014-04-17 DIAGNOSIS — Z48815 Encounter for surgical aftercare following surgery on the digestive system: Secondary | ICD-10-CM | POA: Diagnosis not present

## 2014-04-17 DIAGNOSIS — I129 Hypertensive chronic kidney disease with stage 1 through stage 4 chronic kidney disease, or unspecified chronic kidney disease: Secondary | ICD-10-CM | POA: Diagnosis not present

## 2014-04-17 DIAGNOSIS — Z48816 Encounter for surgical aftercare following surgery on the genitourinary system: Secondary | ICD-10-CM | POA: Diagnosis not present

## 2014-04-18 DIAGNOSIS — Z23 Encounter for immunization: Secondary | ICD-10-CM | POA: Diagnosis not present

## 2014-04-18 DIAGNOSIS — N186 End stage renal disease: Secondary | ICD-10-CM | POA: Diagnosis not present

## 2014-04-18 DIAGNOSIS — D631 Anemia in chronic kidney disease: Secondary | ICD-10-CM | POA: Diagnosis not present

## 2014-04-18 DIAGNOSIS — D509 Iron deficiency anemia, unspecified: Secondary | ICD-10-CM | POA: Diagnosis not present

## 2014-04-20 DIAGNOSIS — N189 Chronic kidney disease, unspecified: Secondary | ICD-10-CM | POA: Diagnosis not present

## 2014-04-20 DIAGNOSIS — Z48815 Encounter for surgical aftercare following surgery on the digestive system: Secondary | ICD-10-CM | POA: Diagnosis not present

## 2014-04-20 DIAGNOSIS — E119 Type 2 diabetes mellitus without complications: Secondary | ICD-10-CM | POA: Diagnosis not present

## 2014-04-20 DIAGNOSIS — Z48816 Encounter for surgical aftercare following surgery on the genitourinary system: Secondary | ICD-10-CM | POA: Diagnosis not present

## 2014-04-20 DIAGNOSIS — Z89512 Acquired absence of left leg below knee: Secondary | ICD-10-CM | POA: Diagnosis not present

## 2014-04-20 DIAGNOSIS — I129 Hypertensive chronic kidney disease with stage 1 through stage 4 chronic kidney disease, or unspecified chronic kidney disease: Secondary | ICD-10-CM | POA: Diagnosis not present

## 2014-04-20 NOTE — Telephone Encounter (Signed)
Rx request to pharmacy/SLS Requested drug refills are authorized, however, the patient needs further evaluation and/or laboratory testing before further refills are given. Ask him to make an appointment for this.  

## 2014-04-21 DIAGNOSIS — N186 End stage renal disease: Secondary | ICD-10-CM | POA: Diagnosis not present

## 2014-04-21 DIAGNOSIS — D631 Anemia in chronic kidney disease: Secondary | ICD-10-CM | POA: Diagnosis not present

## 2014-04-21 DIAGNOSIS — Z23 Encounter for immunization: Secondary | ICD-10-CM | POA: Diagnosis not present

## 2014-04-21 DIAGNOSIS — D509 Iron deficiency anemia, unspecified: Secondary | ICD-10-CM | POA: Diagnosis not present

## 2014-04-22 DIAGNOSIS — I809 Phlebitis and thrombophlebitis of unspecified site: Secondary | ICD-10-CM | POA: Diagnosis not present

## 2014-04-22 DIAGNOSIS — L98493 Non-pressure chronic ulcer of skin of other sites with necrosis of muscle: Secondary | ICD-10-CM | POA: Diagnosis not present

## 2014-04-22 DIAGNOSIS — Z48815 Encounter for surgical aftercare following surgery on the digestive system: Secondary | ICD-10-CM | POA: Diagnosis not present

## 2014-04-22 DIAGNOSIS — E1140 Type 2 diabetes mellitus with diabetic neuropathy, unspecified: Secondary | ICD-10-CM | POA: Diagnosis not present

## 2014-04-22 DIAGNOSIS — E039 Hypothyroidism, unspecified: Secondary | ICD-10-CM | POA: Diagnosis not present

## 2014-04-22 DIAGNOSIS — N186 End stage renal disease: Secondary | ICD-10-CM | POA: Diagnosis not present

## 2014-04-22 DIAGNOSIS — Z48816 Encounter for surgical aftercare following surgery on the genitourinary system: Secondary | ICD-10-CM | POA: Diagnosis not present

## 2014-04-22 DIAGNOSIS — E119 Type 2 diabetes mellitus without complications: Secondary | ICD-10-CM | POA: Diagnosis not present

## 2014-04-22 DIAGNOSIS — N189 Chronic kidney disease, unspecified: Secondary | ICD-10-CM | POA: Diagnosis not present

## 2014-04-22 DIAGNOSIS — I129 Hypertensive chronic kidney disease with stage 1 through stage 4 chronic kidney disease, or unspecified chronic kidney disease: Secondary | ICD-10-CM | POA: Diagnosis not present

## 2014-04-22 DIAGNOSIS — Z89512 Acquired absence of left leg below knee: Secondary | ICD-10-CM | POA: Diagnosis not present

## 2014-04-23 DIAGNOSIS — Z23 Encounter for immunization: Secondary | ICD-10-CM | POA: Diagnosis not present

## 2014-04-23 DIAGNOSIS — D509 Iron deficiency anemia, unspecified: Secondary | ICD-10-CM | POA: Diagnosis not present

## 2014-04-23 DIAGNOSIS — D631 Anemia in chronic kidney disease: Secondary | ICD-10-CM | POA: Diagnosis not present

## 2014-04-23 DIAGNOSIS — N186 End stage renal disease: Secondary | ICD-10-CM | POA: Diagnosis not present

## 2014-04-24 DIAGNOSIS — N189 Chronic kidney disease, unspecified: Secondary | ICD-10-CM | POA: Diagnosis not present

## 2014-04-24 DIAGNOSIS — Z48816 Encounter for surgical aftercare following surgery on the genitourinary system: Secondary | ICD-10-CM | POA: Diagnosis not present

## 2014-04-24 DIAGNOSIS — Z89512 Acquired absence of left leg below knee: Secondary | ICD-10-CM | POA: Diagnosis not present

## 2014-04-24 DIAGNOSIS — Z48815 Encounter for surgical aftercare following surgery on the digestive system: Secondary | ICD-10-CM | POA: Diagnosis not present

## 2014-04-24 DIAGNOSIS — I129 Hypertensive chronic kidney disease with stage 1 through stage 4 chronic kidney disease, or unspecified chronic kidney disease: Secondary | ICD-10-CM | POA: Diagnosis not present

## 2014-04-24 DIAGNOSIS — E119 Type 2 diabetes mellitus without complications: Secondary | ICD-10-CM | POA: Diagnosis not present

## 2014-04-25 DIAGNOSIS — D631 Anemia in chronic kidney disease: Secondary | ICD-10-CM | POA: Diagnosis not present

## 2014-04-25 DIAGNOSIS — Z23 Encounter for immunization: Secondary | ICD-10-CM | POA: Diagnosis not present

## 2014-04-25 DIAGNOSIS — N186 End stage renal disease: Secondary | ICD-10-CM | POA: Diagnosis not present

## 2014-04-25 DIAGNOSIS — D509 Iron deficiency anemia, unspecified: Secondary | ICD-10-CM | POA: Diagnosis not present

## 2014-04-27 DIAGNOSIS — Z48815 Encounter for surgical aftercare following surgery on the digestive system: Secondary | ICD-10-CM | POA: Diagnosis not present

## 2014-04-27 DIAGNOSIS — Z48816 Encounter for surgical aftercare following surgery on the genitourinary system: Secondary | ICD-10-CM | POA: Diagnosis not present

## 2014-04-27 DIAGNOSIS — E119 Type 2 diabetes mellitus without complications: Secondary | ICD-10-CM | POA: Diagnosis not present

## 2014-04-27 DIAGNOSIS — N189 Chronic kidney disease, unspecified: Secondary | ICD-10-CM | POA: Diagnosis not present

## 2014-04-27 DIAGNOSIS — I129 Hypertensive chronic kidney disease with stage 1 through stage 4 chronic kidney disease, or unspecified chronic kidney disease: Secondary | ICD-10-CM | POA: Diagnosis not present

## 2014-04-27 DIAGNOSIS — Z89512 Acquired absence of left leg below knee: Secondary | ICD-10-CM | POA: Diagnosis not present

## 2014-04-28 DIAGNOSIS — D649 Anemia, unspecified: Secondary | ICD-10-CM | POA: Diagnosis not present

## 2014-04-28 DIAGNOSIS — D631 Anemia in chronic kidney disease: Secondary | ICD-10-CM | POA: Diagnosis not present

## 2014-04-28 DIAGNOSIS — N186 End stage renal disease: Secondary | ICD-10-CM | POA: Diagnosis not present

## 2014-04-28 DIAGNOSIS — I10 Essential (primary) hypertension: Secondary | ICD-10-CM | POA: Diagnosis not present

## 2014-04-29 DIAGNOSIS — Z48816 Encounter for surgical aftercare following surgery on the genitourinary system: Secondary | ICD-10-CM | POA: Diagnosis not present

## 2014-04-29 DIAGNOSIS — I129 Hypertensive chronic kidney disease with stage 1 through stage 4 chronic kidney disease, or unspecified chronic kidney disease: Secondary | ICD-10-CM | POA: Diagnosis not present

## 2014-04-29 DIAGNOSIS — Z89512 Acquired absence of left leg below knee: Secondary | ICD-10-CM | POA: Diagnosis not present

## 2014-04-29 DIAGNOSIS — N189 Chronic kidney disease, unspecified: Secondary | ICD-10-CM | POA: Diagnosis not present

## 2014-04-29 DIAGNOSIS — Z48815 Encounter for surgical aftercare following surgery on the digestive system: Secondary | ICD-10-CM | POA: Diagnosis not present

## 2014-04-29 DIAGNOSIS — E119 Type 2 diabetes mellitus without complications: Secondary | ICD-10-CM | POA: Diagnosis not present

## 2014-04-30 DIAGNOSIS — N186 End stage renal disease: Secondary | ICD-10-CM | POA: Diagnosis not present

## 2014-04-30 DIAGNOSIS — K769 Liver disease, unspecified: Secondary | ICD-10-CM | POA: Diagnosis not present

## 2014-04-30 DIAGNOSIS — D631 Anemia in chronic kidney disease: Secondary | ICD-10-CM | POA: Diagnosis not present

## 2014-05-01 DIAGNOSIS — N189 Chronic kidney disease, unspecified: Secondary | ICD-10-CM | POA: Diagnosis not present

## 2014-05-01 DIAGNOSIS — I129 Hypertensive chronic kidney disease with stage 1 through stage 4 chronic kidney disease, or unspecified chronic kidney disease: Secondary | ICD-10-CM | POA: Diagnosis not present

## 2014-05-01 DIAGNOSIS — Z89512 Acquired absence of left leg below knee: Secondary | ICD-10-CM | POA: Diagnosis not present

## 2014-05-01 DIAGNOSIS — E119 Type 2 diabetes mellitus without complications: Secondary | ICD-10-CM | POA: Diagnosis not present

## 2014-05-01 DIAGNOSIS — Z48815 Encounter for surgical aftercare following surgery on the digestive system: Secondary | ICD-10-CM | POA: Diagnosis not present

## 2014-05-01 DIAGNOSIS — Z48816 Encounter for surgical aftercare following surgery on the genitourinary system: Secondary | ICD-10-CM | POA: Diagnosis not present

## 2014-05-02 DIAGNOSIS — N186 End stage renal disease: Secondary | ICD-10-CM | POA: Diagnosis not present

## 2014-05-02 DIAGNOSIS — D631 Anemia in chronic kidney disease: Secondary | ICD-10-CM | POA: Diagnosis not present

## 2014-05-04 DIAGNOSIS — Z48815 Encounter for surgical aftercare following surgery on the digestive system: Secondary | ICD-10-CM | POA: Diagnosis not present

## 2014-05-04 DIAGNOSIS — I129 Hypertensive chronic kidney disease with stage 1 through stage 4 chronic kidney disease, or unspecified chronic kidney disease: Secondary | ICD-10-CM | POA: Diagnosis not present

## 2014-05-04 DIAGNOSIS — Z89512 Acquired absence of left leg below knee: Secondary | ICD-10-CM | POA: Diagnosis not present

## 2014-05-04 DIAGNOSIS — N189 Chronic kidney disease, unspecified: Secondary | ICD-10-CM | POA: Diagnosis not present

## 2014-05-04 DIAGNOSIS — Z48816 Encounter for surgical aftercare following surgery on the genitourinary system: Secondary | ICD-10-CM | POA: Diagnosis not present

## 2014-05-04 DIAGNOSIS — E119 Type 2 diabetes mellitus without complications: Secondary | ICD-10-CM | POA: Diagnosis not present

## 2014-05-05 DIAGNOSIS — D631 Anemia in chronic kidney disease: Secondary | ICD-10-CM | POA: Diagnosis not present

## 2014-05-05 DIAGNOSIS — N186 End stage renal disease: Secondary | ICD-10-CM | POA: Diagnosis not present

## 2014-05-06 DIAGNOSIS — M1990 Unspecified osteoarthritis, unspecified site: Secondary | ICD-10-CM | POA: Diagnosis not present

## 2014-05-06 DIAGNOSIS — E1140 Type 2 diabetes mellitus with diabetic neuropathy, unspecified: Secondary | ICD-10-CM | POA: Diagnosis not present

## 2014-05-06 DIAGNOSIS — I129 Hypertensive chronic kidney disease with stage 1 through stage 4 chronic kidney disease, or unspecified chronic kidney disease: Secondary | ICD-10-CM | POA: Diagnosis not present

## 2014-05-06 DIAGNOSIS — Z794 Long term (current) use of insulin: Secondary | ICD-10-CM | POA: Diagnosis not present

## 2014-05-06 DIAGNOSIS — L98493 Non-pressure chronic ulcer of skin of other sites with necrosis of muscle: Secondary | ICD-10-CM | POA: Diagnosis not present

## 2014-05-06 DIAGNOSIS — E039 Hypothyroidism, unspecified: Secondary | ICD-10-CM | POA: Diagnosis not present

## 2014-05-06 DIAGNOSIS — N186 End stage renal disease: Secondary | ICD-10-CM | POA: Diagnosis not present

## 2014-05-06 DIAGNOSIS — N289 Disorder of kidney and ureter, unspecified: Secondary | ICD-10-CM | POA: Diagnosis not present

## 2014-05-06 DIAGNOSIS — I872 Venous insufficiency (chronic) (peripheral): Secondary | ICD-10-CM | POA: Diagnosis not present

## 2014-05-06 DIAGNOSIS — I809 Phlebitis and thrombophlebitis of unspecified site: Secondary | ICD-10-CM | POA: Diagnosis not present

## 2014-05-07 DIAGNOSIS — N186 End stage renal disease: Secondary | ICD-10-CM | POA: Diagnosis not present

## 2014-05-07 DIAGNOSIS — D631 Anemia in chronic kidney disease: Secondary | ICD-10-CM | POA: Diagnosis not present

## 2014-05-08 DIAGNOSIS — Z48815 Encounter for surgical aftercare following surgery on the digestive system: Secondary | ICD-10-CM | POA: Diagnosis not present

## 2014-05-08 DIAGNOSIS — I129 Hypertensive chronic kidney disease with stage 1 through stage 4 chronic kidney disease, or unspecified chronic kidney disease: Secondary | ICD-10-CM | POA: Diagnosis not present

## 2014-05-08 DIAGNOSIS — Z89512 Acquired absence of left leg below knee: Secondary | ICD-10-CM | POA: Diagnosis not present

## 2014-05-08 DIAGNOSIS — Z48816 Encounter for surgical aftercare following surgery on the genitourinary system: Secondary | ICD-10-CM | POA: Diagnosis not present

## 2014-05-08 DIAGNOSIS — N189 Chronic kidney disease, unspecified: Secondary | ICD-10-CM | POA: Diagnosis not present

## 2014-05-08 DIAGNOSIS — E119 Type 2 diabetes mellitus without complications: Secondary | ICD-10-CM | POA: Diagnosis not present

## 2014-05-09 DIAGNOSIS — N186 End stage renal disease: Secondary | ICD-10-CM | POA: Diagnosis not present

## 2014-05-09 DIAGNOSIS — D631 Anemia in chronic kidney disease: Secondary | ICD-10-CM | POA: Diagnosis not present

## 2014-05-11 DIAGNOSIS — Z89512 Acquired absence of left leg below knee: Secondary | ICD-10-CM | POA: Diagnosis not present

## 2014-05-11 DIAGNOSIS — Z48815 Encounter for surgical aftercare following surgery on the digestive system: Secondary | ICD-10-CM | POA: Diagnosis not present

## 2014-05-11 DIAGNOSIS — Z48816 Encounter for surgical aftercare following surgery on the genitourinary system: Secondary | ICD-10-CM | POA: Diagnosis not present

## 2014-05-11 DIAGNOSIS — N189 Chronic kidney disease, unspecified: Secondary | ICD-10-CM | POA: Diagnosis not present

## 2014-05-11 DIAGNOSIS — I129 Hypertensive chronic kidney disease with stage 1 through stage 4 chronic kidney disease, or unspecified chronic kidney disease: Secondary | ICD-10-CM | POA: Diagnosis not present

## 2014-05-11 DIAGNOSIS — E119 Type 2 diabetes mellitus without complications: Secondary | ICD-10-CM | POA: Diagnosis not present

## 2014-05-12 ENCOUNTER — Telehealth: Payer: Self-pay | Admitting: Physician Assistant

## 2014-05-12 DIAGNOSIS — D631 Anemia in chronic kidney disease: Secondary | ICD-10-CM | POA: Diagnosis not present

## 2014-05-12 DIAGNOSIS — N186 End stage renal disease: Secondary | ICD-10-CM | POA: Diagnosis not present

## 2014-05-12 NOTE — Telephone Encounter (Signed)
Spoke with caller and gave VO to continue PT with patient per provider instructions/SLS

## 2014-05-12 NOTE — Telephone Encounter (Signed)
Caller name:[facility] Relation to RH:[phone] Call back [place]:  Reason for call: [facility] is requesting a verbal order for pt to continue physical therapy for 2x a week for 6 weeks

## 2014-05-13 DIAGNOSIS — Z89512 Acquired absence of left leg below knee: Secondary | ICD-10-CM | POA: Diagnosis not present

## 2014-05-13 DIAGNOSIS — Z936 Other artificial openings of urinary tract status: Secondary | ICD-10-CM | POA: Diagnosis not present

## 2014-05-13 DIAGNOSIS — Z794 Long term (current) use of insulin: Secondary | ICD-10-CM | POA: Diagnosis not present

## 2014-05-13 DIAGNOSIS — E119 Type 2 diabetes mellitus without complications: Secondary | ICD-10-CM | POA: Diagnosis not present

## 2014-05-13 DIAGNOSIS — I120 Hypertensive chronic kidney disease with stage 5 chronic kidney disease or end stage renal disease: Secondary | ICD-10-CM | POA: Diagnosis not present

## 2014-05-13 DIAGNOSIS — N186 End stage renal disease: Secondary | ICD-10-CM | POA: Diagnosis not present

## 2014-05-13 DIAGNOSIS — Z992 Dependence on renal dialysis: Secondary | ICD-10-CM | POA: Diagnosis not present

## 2014-05-13 DIAGNOSIS — Z48816 Encounter for surgical aftercare following surgery on the genitourinary system: Secondary | ICD-10-CM | POA: Diagnosis not present

## 2014-05-14 DIAGNOSIS — D631 Anemia in chronic kidney disease: Secondary | ICD-10-CM | POA: Diagnosis not present

## 2014-05-14 DIAGNOSIS — N186 End stage renal disease: Secondary | ICD-10-CM | POA: Diagnosis not present

## 2014-05-15 DIAGNOSIS — I120 Hypertensive chronic kidney disease with stage 5 chronic kidney disease or end stage renal disease: Secondary | ICD-10-CM | POA: Diagnosis not present

## 2014-05-15 DIAGNOSIS — Z89512 Acquired absence of left leg below knee: Secondary | ICD-10-CM | POA: Diagnosis not present

## 2014-05-15 DIAGNOSIS — Z48816 Encounter for surgical aftercare following surgery on the genitourinary system: Secondary | ICD-10-CM | POA: Diagnosis not present

## 2014-05-15 DIAGNOSIS — N186 End stage renal disease: Secondary | ICD-10-CM | POA: Diagnosis not present

## 2014-05-15 DIAGNOSIS — Z992 Dependence on renal dialysis: Secondary | ICD-10-CM | POA: Diagnosis not present

## 2014-05-15 DIAGNOSIS — E119 Type 2 diabetes mellitus without complications: Secondary | ICD-10-CM | POA: Diagnosis not present

## 2014-05-16 DIAGNOSIS — N186 End stage renal disease: Secondary | ICD-10-CM | POA: Diagnosis not present

## 2014-05-16 DIAGNOSIS — D631 Anemia in chronic kidney disease: Secondary | ICD-10-CM | POA: Diagnosis not present

## 2014-05-18 DIAGNOSIS — N186 End stage renal disease: Secondary | ICD-10-CM | POA: Diagnosis not present

## 2014-05-18 DIAGNOSIS — Z992 Dependence on renal dialysis: Secondary | ICD-10-CM | POA: Diagnosis not present

## 2014-05-18 DIAGNOSIS — E119 Type 2 diabetes mellitus without complications: Secondary | ICD-10-CM | POA: Diagnosis not present

## 2014-05-18 DIAGNOSIS — Z4901 Encounter for fitting and adjustment of extracorporeal dialysis catheter: Secondary | ICD-10-CM | POA: Diagnosis not present

## 2014-05-18 DIAGNOSIS — I120 Hypertensive chronic kidney disease with stage 5 chronic kidney disease or end stage renal disease: Secondary | ICD-10-CM | POA: Diagnosis not present

## 2014-05-18 DIAGNOSIS — Z89512 Acquired absence of left leg below knee: Secondary | ICD-10-CM | POA: Diagnosis not present

## 2014-05-18 DIAGNOSIS — Z48816 Encounter for surgical aftercare following surgery on the genitourinary system: Secondary | ICD-10-CM | POA: Diagnosis not present

## 2014-05-18 DIAGNOSIS — Z794 Long term (current) use of insulin: Secondary | ICD-10-CM | POA: Diagnosis not present

## 2014-05-19 DIAGNOSIS — D631 Anemia in chronic kidney disease: Secondary | ICD-10-CM | POA: Diagnosis not present

## 2014-05-19 DIAGNOSIS — N186 End stage renal disease: Secondary | ICD-10-CM | POA: Diagnosis not present

## 2014-05-20 ENCOUNTER — Other Ambulatory Visit: Payer: Self-pay | Admitting: Physician Assistant

## 2014-05-20 DIAGNOSIS — E039 Hypothyroidism, unspecified: Secondary | ICD-10-CM | POA: Diagnosis not present

## 2014-05-20 DIAGNOSIS — Z992 Dependence on renal dialysis: Secondary | ICD-10-CM | POA: Diagnosis not present

## 2014-05-20 DIAGNOSIS — N186 End stage renal disease: Secondary | ICD-10-CM | POA: Diagnosis not present

## 2014-05-20 DIAGNOSIS — I809 Phlebitis and thrombophlebitis of unspecified site: Secondary | ICD-10-CM | POA: Diagnosis not present

## 2014-05-20 DIAGNOSIS — E1040 Type 1 diabetes mellitus with diabetic neuropathy, unspecified: Secondary | ICD-10-CM | POA: Diagnosis not present

## 2014-05-20 DIAGNOSIS — I120 Hypertensive chronic kidney disease with stage 5 chronic kidney disease or end stage renal disease: Secondary | ICD-10-CM | POA: Diagnosis not present

## 2014-05-20 DIAGNOSIS — E1140 Type 2 diabetes mellitus with diabetic neuropathy, unspecified: Secondary | ICD-10-CM | POA: Diagnosis not present

## 2014-05-20 DIAGNOSIS — Z89512 Acquired absence of left leg below knee: Secondary | ICD-10-CM | POA: Diagnosis not present

## 2014-05-20 DIAGNOSIS — Z48816 Encounter for surgical aftercare following surgery on the genitourinary system: Secondary | ICD-10-CM | POA: Diagnosis not present

## 2014-05-20 DIAGNOSIS — E119 Type 2 diabetes mellitus without complications: Secondary | ICD-10-CM | POA: Diagnosis not present

## 2014-05-20 DIAGNOSIS — I129 Hypertensive chronic kidney disease with stage 1 through stage 4 chronic kidney disease, or unspecified chronic kidney disease: Secondary | ICD-10-CM | POA: Diagnosis not present

## 2014-05-20 DIAGNOSIS — I872 Venous insufficiency (chronic) (peripheral): Secondary | ICD-10-CM | POA: Diagnosis not present

## 2014-05-20 DIAGNOSIS — L98493 Non-pressure chronic ulcer of skin of other sites with necrosis of muscle: Secondary | ICD-10-CM | POA: Diagnosis not present

## 2014-05-20 NOTE — Telephone Encounter (Signed)
Med filled.  

## 2014-05-21 DIAGNOSIS — D631 Anemia in chronic kidney disease: Secondary | ICD-10-CM | POA: Diagnosis not present

## 2014-05-21 DIAGNOSIS — N186 End stage renal disease: Secondary | ICD-10-CM | POA: Diagnosis not present

## 2014-05-22 DIAGNOSIS — Z992 Dependence on renal dialysis: Secondary | ICD-10-CM | POA: Diagnosis not present

## 2014-05-22 DIAGNOSIS — Z89512 Acquired absence of left leg below knee: Secondary | ICD-10-CM | POA: Diagnosis not present

## 2014-05-22 DIAGNOSIS — Z48816 Encounter for surgical aftercare following surgery on the genitourinary system: Secondary | ICD-10-CM | POA: Diagnosis not present

## 2014-05-22 DIAGNOSIS — E119 Type 2 diabetes mellitus without complications: Secondary | ICD-10-CM | POA: Diagnosis not present

## 2014-05-22 DIAGNOSIS — I120 Hypertensive chronic kidney disease with stage 5 chronic kidney disease or end stage renal disease: Secondary | ICD-10-CM | POA: Diagnosis not present

## 2014-05-22 DIAGNOSIS — N186 End stage renal disease: Secondary | ICD-10-CM | POA: Diagnosis not present

## 2014-05-23 DIAGNOSIS — N186 End stage renal disease: Secondary | ICD-10-CM | POA: Diagnosis not present

## 2014-05-23 DIAGNOSIS — D631 Anemia in chronic kidney disease: Secondary | ICD-10-CM | POA: Diagnosis not present

## 2014-05-25 ENCOUNTER — Other Ambulatory Visit: Payer: Self-pay | Admitting: Physician Assistant

## 2014-05-25 DIAGNOSIS — Z992 Dependence on renal dialysis: Secondary | ICD-10-CM | POA: Diagnosis not present

## 2014-05-25 DIAGNOSIS — E119 Type 2 diabetes mellitus without complications: Secondary | ICD-10-CM | POA: Diagnosis not present

## 2014-05-25 DIAGNOSIS — Z89512 Acquired absence of left leg below knee: Secondary | ICD-10-CM | POA: Diagnosis not present

## 2014-05-25 DIAGNOSIS — Z48816 Encounter for surgical aftercare following surgery on the genitourinary system: Secondary | ICD-10-CM | POA: Diagnosis not present

## 2014-05-25 DIAGNOSIS — N186 End stage renal disease: Secondary | ICD-10-CM | POA: Diagnosis not present

## 2014-05-25 DIAGNOSIS — I120 Hypertensive chronic kidney disease with stage 5 chronic kidney disease or end stage renal disease: Secondary | ICD-10-CM | POA: Diagnosis not present

## 2014-05-25 NOTE — Telephone Encounter (Signed)
Rx request to pharmacy/SLS  

## 2014-05-26 DIAGNOSIS — D631 Anemia in chronic kidney disease: Secondary | ICD-10-CM | POA: Diagnosis not present

## 2014-05-26 DIAGNOSIS — N186 End stage renal disease: Secondary | ICD-10-CM | POA: Diagnosis not present

## 2014-05-27 DIAGNOSIS — Z992 Dependence on renal dialysis: Secondary | ICD-10-CM | POA: Diagnosis not present

## 2014-05-27 DIAGNOSIS — N186 End stage renal disease: Secondary | ICD-10-CM | POA: Diagnosis not present

## 2014-05-27 DIAGNOSIS — Z89512 Acquired absence of left leg below knee: Secondary | ICD-10-CM | POA: Diagnosis not present

## 2014-05-27 DIAGNOSIS — I120 Hypertensive chronic kidney disease with stage 5 chronic kidney disease or end stage renal disease: Secondary | ICD-10-CM | POA: Diagnosis not present

## 2014-05-27 DIAGNOSIS — E119 Type 2 diabetes mellitus without complications: Secondary | ICD-10-CM | POA: Diagnosis not present

## 2014-05-27 DIAGNOSIS — Z48816 Encounter for surgical aftercare following surgery on the genitourinary system: Secondary | ICD-10-CM | POA: Diagnosis not present

## 2014-05-28 DIAGNOSIS — D631 Anemia in chronic kidney disease: Secondary | ICD-10-CM | POA: Diagnosis not present

## 2014-05-28 DIAGNOSIS — N186 End stage renal disease: Secondary | ICD-10-CM | POA: Diagnosis not present

## 2014-05-29 DIAGNOSIS — Z48816 Encounter for surgical aftercare following surgery on the genitourinary system: Secondary | ICD-10-CM | POA: Diagnosis not present

## 2014-05-29 DIAGNOSIS — D649 Anemia, unspecified: Secondary | ICD-10-CM | POA: Diagnosis not present

## 2014-05-29 DIAGNOSIS — Z992 Dependence on renal dialysis: Secondary | ICD-10-CM | POA: Diagnosis not present

## 2014-05-29 DIAGNOSIS — Z89512 Acquired absence of left leg below knee: Secondary | ICD-10-CM | POA: Diagnosis not present

## 2014-05-29 DIAGNOSIS — N186 End stage renal disease: Secondary | ICD-10-CM | POA: Diagnosis not present

## 2014-05-29 DIAGNOSIS — E119 Type 2 diabetes mellitus without complications: Secondary | ICD-10-CM | POA: Diagnosis not present

## 2014-05-29 DIAGNOSIS — I10 Essential (primary) hypertension: Secondary | ICD-10-CM | POA: Diagnosis not present

## 2014-05-29 DIAGNOSIS — I120 Hypertensive chronic kidney disease with stage 5 chronic kidney disease or end stage renal disease: Secondary | ICD-10-CM | POA: Diagnosis not present

## 2014-05-30 DIAGNOSIS — D509 Iron deficiency anemia, unspecified: Secondary | ICD-10-CM | POA: Diagnosis not present

## 2014-05-30 DIAGNOSIS — N186 End stage renal disease: Secondary | ICD-10-CM | POA: Diagnosis not present

## 2014-05-30 DIAGNOSIS — D631 Anemia in chronic kidney disease: Secondary | ICD-10-CM | POA: Diagnosis not present

## 2014-05-30 DIAGNOSIS — N2581 Secondary hyperparathyroidism of renal origin: Secondary | ICD-10-CM | POA: Diagnosis not present

## 2014-06-01 DIAGNOSIS — N186 End stage renal disease: Secondary | ICD-10-CM | POA: Diagnosis not present

## 2014-06-01 DIAGNOSIS — Z48816 Encounter for surgical aftercare following surgery on the genitourinary system: Secondary | ICD-10-CM | POA: Diagnosis not present

## 2014-06-01 DIAGNOSIS — Z992 Dependence on renal dialysis: Secondary | ICD-10-CM | POA: Diagnosis not present

## 2014-06-01 DIAGNOSIS — I120 Hypertensive chronic kidney disease with stage 5 chronic kidney disease or end stage renal disease: Secondary | ICD-10-CM | POA: Diagnosis not present

## 2014-06-01 DIAGNOSIS — Z89512 Acquired absence of left leg below knee: Secondary | ICD-10-CM | POA: Diagnosis not present

## 2014-06-01 DIAGNOSIS — E119 Type 2 diabetes mellitus without complications: Secondary | ICD-10-CM | POA: Diagnosis not present

## 2014-06-02 DIAGNOSIS — D509 Iron deficiency anemia, unspecified: Secondary | ICD-10-CM | POA: Diagnosis not present

## 2014-06-02 DIAGNOSIS — D631 Anemia in chronic kidney disease: Secondary | ICD-10-CM | POA: Diagnosis not present

## 2014-06-02 DIAGNOSIS — N186 End stage renal disease: Secondary | ICD-10-CM | POA: Diagnosis not present

## 2014-06-02 DIAGNOSIS — N2581 Secondary hyperparathyroidism of renal origin: Secondary | ICD-10-CM | POA: Diagnosis not present

## 2014-06-03 DIAGNOSIS — Z992 Dependence on renal dialysis: Secondary | ICD-10-CM | POA: Diagnosis not present

## 2014-06-03 DIAGNOSIS — Z89512 Acquired absence of left leg below knee: Secondary | ICD-10-CM | POA: Diagnosis not present

## 2014-06-03 DIAGNOSIS — I129 Hypertensive chronic kidney disease with stage 1 through stage 4 chronic kidney disease, or unspecified chronic kidney disease: Secondary | ICD-10-CM | POA: Diagnosis not present

## 2014-06-03 DIAGNOSIS — E1140 Type 2 diabetes mellitus with diabetic neuropathy, unspecified: Secondary | ICD-10-CM | POA: Diagnosis not present

## 2014-06-03 DIAGNOSIS — E119 Type 2 diabetes mellitus without complications: Secondary | ICD-10-CM | POA: Diagnosis not present

## 2014-06-03 DIAGNOSIS — Z794 Long term (current) use of insulin: Secondary | ICD-10-CM | POA: Diagnosis not present

## 2014-06-03 DIAGNOSIS — N186 End stage renal disease: Secondary | ICD-10-CM | POA: Diagnosis not present

## 2014-06-03 DIAGNOSIS — I120 Hypertensive chronic kidney disease with stage 5 chronic kidney disease or end stage renal disease: Secondary | ICD-10-CM | POA: Diagnosis not present

## 2014-06-03 DIAGNOSIS — E039 Hypothyroidism, unspecified: Secondary | ICD-10-CM | POA: Diagnosis not present

## 2014-06-03 DIAGNOSIS — M1990 Unspecified osteoarthritis, unspecified site: Secondary | ICD-10-CM | POA: Diagnosis not present

## 2014-06-03 DIAGNOSIS — I809 Phlebitis and thrombophlebitis of unspecified site: Secondary | ICD-10-CM | POA: Diagnosis not present

## 2014-06-03 DIAGNOSIS — N289 Disorder of kidney and ureter, unspecified: Secondary | ICD-10-CM | POA: Diagnosis not present

## 2014-06-03 DIAGNOSIS — Z48816 Encounter for surgical aftercare following surgery on the genitourinary system: Secondary | ICD-10-CM | POA: Diagnosis not present

## 2014-06-03 DIAGNOSIS — L98493 Non-pressure chronic ulcer of skin of other sites with necrosis of muscle: Secondary | ICD-10-CM | POA: Diagnosis not present

## 2014-06-04 ENCOUNTER — Telehealth: Payer: Self-pay | Admitting: Physician Assistant

## 2014-06-04 DIAGNOSIS — D509 Iron deficiency anemia, unspecified: Secondary | ICD-10-CM | POA: Diagnosis not present

## 2014-06-04 DIAGNOSIS — E1165 Type 2 diabetes mellitus with hyperglycemia: Secondary | ICD-10-CM | POA: Diagnosis not present

## 2014-06-04 DIAGNOSIS — E119 Type 2 diabetes mellitus without complications: Secondary | ICD-10-CM | POA: Diagnosis not present

## 2014-06-04 DIAGNOSIS — D631 Anemia in chronic kidney disease: Secondary | ICD-10-CM | POA: Diagnosis not present

## 2014-06-04 DIAGNOSIS — I120 Hypertensive chronic kidney disease with stage 5 chronic kidney disease or end stage renal disease: Secondary | ICD-10-CM | POA: Diagnosis not present

## 2014-06-04 DIAGNOSIS — K769 Liver disease, unspecified: Secondary | ICD-10-CM | POA: Diagnosis not present

## 2014-06-04 DIAGNOSIS — N2581 Secondary hyperparathyroidism of renal origin: Secondary | ICD-10-CM | POA: Diagnosis not present

## 2014-06-04 DIAGNOSIS — N186 End stage renal disease: Secondary | ICD-10-CM | POA: Diagnosis not present

## 2014-06-04 DIAGNOSIS — Z48816 Encounter for surgical aftercare following surgery on the genitourinary system: Secondary | ICD-10-CM | POA: Diagnosis not present

## 2014-06-04 DIAGNOSIS — Z992 Dependence on renal dialysis: Secondary | ICD-10-CM | POA: Diagnosis not present

## 2014-06-04 DIAGNOSIS — Z89512 Acquired absence of left leg below knee: Secondary | ICD-10-CM | POA: Diagnosis not present

## 2014-06-04 NOTE — Telephone Encounter (Signed)
Caller name: chris from [facility] Relation to pt: Call back number: [phone]  Pharmacy:  Reason for call:   Wants a one day visit to reassess patient.

## 2014-06-04 NOTE — Telephone Encounter (Signed)
For PT and ok to leave a detailed message

## 2014-06-05 NOTE — Telephone Encounter (Signed)
Verbal ok granted for assessment.

## 2014-06-05 NOTE — Telephone Encounter (Signed)
Left detailed message with Gerald Stabs ok for PT assessment.  Instructed them to fax over written order for signature.

## 2014-06-06 DIAGNOSIS — N2581 Secondary hyperparathyroidism of renal origin: Secondary | ICD-10-CM | POA: Diagnosis not present

## 2014-06-06 DIAGNOSIS — D509 Iron deficiency anemia, unspecified: Secondary | ICD-10-CM | POA: Diagnosis not present

## 2014-06-06 DIAGNOSIS — D631 Anemia in chronic kidney disease: Secondary | ICD-10-CM | POA: Diagnosis not present

## 2014-06-06 DIAGNOSIS — N186 End stage renal disease: Secondary | ICD-10-CM | POA: Diagnosis not present

## 2014-06-07 DIAGNOSIS — Z992 Dependence on renal dialysis: Secondary | ICD-10-CM | POA: Diagnosis not present

## 2014-06-07 DIAGNOSIS — Z89512 Acquired absence of left leg below knee: Secondary | ICD-10-CM | POA: Diagnosis not present

## 2014-06-07 DIAGNOSIS — I120 Hypertensive chronic kidney disease with stage 5 chronic kidney disease or end stage renal disease: Secondary | ICD-10-CM | POA: Diagnosis not present

## 2014-06-07 DIAGNOSIS — N186 End stage renal disease: Secondary | ICD-10-CM | POA: Diagnosis not present

## 2014-06-07 DIAGNOSIS — Z48816 Encounter for surgical aftercare following surgery on the genitourinary system: Secondary | ICD-10-CM | POA: Diagnosis not present

## 2014-06-07 DIAGNOSIS — E119 Type 2 diabetes mellitus without complications: Secondary | ICD-10-CM | POA: Diagnosis not present

## 2014-06-08 ENCOUNTER — Ambulatory Visit: Payer: Medicare Other | Admitting: Physician Assistant

## 2014-06-08 DIAGNOSIS — Z992 Dependence on renal dialysis: Secondary | ICD-10-CM | POA: Diagnosis not present

## 2014-06-08 DIAGNOSIS — Z48816 Encounter for surgical aftercare following surgery on the genitourinary system: Secondary | ICD-10-CM | POA: Diagnosis not present

## 2014-06-08 DIAGNOSIS — Z89512 Acquired absence of left leg below knee: Secondary | ICD-10-CM | POA: Diagnosis not present

## 2014-06-08 DIAGNOSIS — I120 Hypertensive chronic kidney disease with stage 5 chronic kidney disease or end stage renal disease: Secondary | ICD-10-CM | POA: Diagnosis not present

## 2014-06-08 DIAGNOSIS — N186 End stage renal disease: Secondary | ICD-10-CM | POA: Diagnosis not present

## 2014-06-08 DIAGNOSIS — E119 Type 2 diabetes mellitus without complications: Secondary | ICD-10-CM | POA: Diagnosis not present

## 2014-06-09 DIAGNOSIS — D631 Anemia in chronic kidney disease: Secondary | ICD-10-CM | POA: Diagnosis not present

## 2014-06-09 DIAGNOSIS — D509 Iron deficiency anemia, unspecified: Secondary | ICD-10-CM | POA: Diagnosis not present

## 2014-06-09 DIAGNOSIS — N186 End stage renal disease: Secondary | ICD-10-CM | POA: Diagnosis not present

## 2014-06-09 DIAGNOSIS — N2581 Secondary hyperparathyroidism of renal origin: Secondary | ICD-10-CM | POA: Diagnosis not present

## 2014-06-10 DIAGNOSIS — N186 End stage renal disease: Secondary | ICD-10-CM | POA: Diagnosis not present

## 2014-06-10 DIAGNOSIS — Z48816 Encounter for surgical aftercare following surgery on the genitourinary system: Secondary | ICD-10-CM | POA: Diagnosis not present

## 2014-06-10 DIAGNOSIS — I120 Hypertensive chronic kidney disease with stage 5 chronic kidney disease or end stage renal disease: Secondary | ICD-10-CM | POA: Diagnosis not present

## 2014-06-10 DIAGNOSIS — Z89512 Acquired absence of left leg below knee: Secondary | ICD-10-CM | POA: Diagnosis not present

## 2014-06-10 DIAGNOSIS — E119 Type 2 diabetes mellitus without complications: Secondary | ICD-10-CM | POA: Diagnosis not present

## 2014-06-10 DIAGNOSIS — Z992 Dependence on renal dialysis: Secondary | ICD-10-CM | POA: Diagnosis not present

## 2014-06-11 ENCOUNTER — Other Ambulatory Visit: Payer: Self-pay | Admitting: Physician Assistant

## 2014-06-11 DIAGNOSIS — N186 End stage renal disease: Secondary | ICD-10-CM | POA: Diagnosis not present

## 2014-06-11 DIAGNOSIS — N2581 Secondary hyperparathyroidism of renal origin: Secondary | ICD-10-CM | POA: Diagnosis not present

## 2014-06-11 DIAGNOSIS — D509 Iron deficiency anemia, unspecified: Secondary | ICD-10-CM | POA: Diagnosis not present

## 2014-06-11 DIAGNOSIS — D631 Anemia in chronic kidney disease: Secondary | ICD-10-CM | POA: Diagnosis not present

## 2014-06-12 ENCOUNTER — Other Ambulatory Visit: Payer: Self-pay | Admitting: Physician Assistant

## 2014-06-12 DIAGNOSIS — Z48816 Encounter for surgical aftercare following surgery on the genitourinary system: Secondary | ICD-10-CM | POA: Diagnosis not present

## 2014-06-12 DIAGNOSIS — Z992 Dependence on renal dialysis: Secondary | ICD-10-CM | POA: Diagnosis not present

## 2014-06-12 DIAGNOSIS — I120 Hypertensive chronic kidney disease with stage 5 chronic kidney disease or end stage renal disease: Secondary | ICD-10-CM | POA: Diagnosis not present

## 2014-06-12 DIAGNOSIS — Z89512 Acquired absence of left leg below knee: Secondary | ICD-10-CM | POA: Diagnosis not present

## 2014-06-12 DIAGNOSIS — E119 Type 2 diabetes mellitus without complications: Secondary | ICD-10-CM | POA: Diagnosis not present

## 2014-06-12 DIAGNOSIS — N186 End stage renal disease: Secondary | ICD-10-CM | POA: Diagnosis not present

## 2014-06-12 NOTE — Telephone Encounter (Signed)
Rx request to pharmacy/SLS  

## 2014-06-13 DIAGNOSIS — D509 Iron deficiency anemia, unspecified: Secondary | ICD-10-CM | POA: Diagnosis not present

## 2014-06-13 DIAGNOSIS — N2581 Secondary hyperparathyroidism of renal origin: Secondary | ICD-10-CM | POA: Diagnosis not present

## 2014-06-13 DIAGNOSIS — N186 End stage renal disease: Secondary | ICD-10-CM | POA: Diagnosis not present

## 2014-06-13 DIAGNOSIS — D631 Anemia in chronic kidney disease: Secondary | ICD-10-CM | POA: Diagnosis not present

## 2014-06-15 DIAGNOSIS — E119 Type 2 diabetes mellitus without complications: Secondary | ICD-10-CM | POA: Diagnosis not present

## 2014-06-15 DIAGNOSIS — I120 Hypertensive chronic kidney disease with stage 5 chronic kidney disease or end stage renal disease: Secondary | ICD-10-CM | POA: Diagnosis not present

## 2014-06-15 DIAGNOSIS — Z48816 Encounter for surgical aftercare following surgery on the genitourinary system: Secondary | ICD-10-CM | POA: Diagnosis not present

## 2014-06-15 DIAGNOSIS — Z992 Dependence on renal dialysis: Secondary | ICD-10-CM | POA: Diagnosis not present

## 2014-06-15 DIAGNOSIS — Z89512 Acquired absence of left leg below knee: Secondary | ICD-10-CM | POA: Diagnosis not present

## 2014-06-15 DIAGNOSIS — N186 End stage renal disease: Secondary | ICD-10-CM | POA: Diagnosis not present

## 2014-06-16 ENCOUNTER — Telehealth: Payer: Self-pay | Admitting: *Deleted

## 2014-06-16 DIAGNOSIS — N2581 Secondary hyperparathyroidism of renal origin: Secondary | ICD-10-CM | POA: Diagnosis not present

## 2014-06-16 DIAGNOSIS — D509 Iron deficiency anemia, unspecified: Secondary | ICD-10-CM | POA: Diagnosis not present

## 2014-06-16 DIAGNOSIS — N186 End stage renal disease: Secondary | ICD-10-CM | POA: Diagnosis not present

## 2014-06-16 DIAGNOSIS — D631 Anemia in chronic kidney disease: Secondary | ICD-10-CM | POA: Diagnosis not present

## 2014-06-16 NOTE — Telephone Encounter (Signed)
Unable to reach patient at time of Pre-Visit Call.  Left message for patient to return call when available.    

## 2014-06-17 ENCOUNTER — Ambulatory Visit (INDEPENDENT_AMBULATORY_CARE_PROVIDER_SITE_OTHER): Payer: Medicare Other | Admitting: Physician Assistant

## 2014-06-17 ENCOUNTER — Encounter: Payer: Self-pay | Admitting: Physician Assistant

## 2014-06-17 ENCOUNTER — Encounter: Payer: Self-pay | Admitting: *Deleted

## 2014-06-17 VITALS — BP 112/64 | HR 76 | Temp 97.5°F | Resp 16 | Ht 71.0 in | Wt 251.4 lb

## 2014-06-17 DIAGNOSIS — N183 Chronic kidney disease, stage 3 unspecified: Secondary | ICD-10-CM

## 2014-06-17 DIAGNOSIS — E119 Type 2 diabetes mellitus without complications: Secondary | ICD-10-CM | POA: Diagnosis not present

## 2014-06-17 DIAGNOSIS — I10 Essential (primary) hypertension: Secondary | ICD-10-CM | POA: Diagnosis not present

## 2014-06-17 DIAGNOSIS — Z89512 Acquired absence of left leg below knee: Secondary | ICD-10-CM

## 2014-06-17 DIAGNOSIS — Z0000 Encounter for general adult medical examination without abnormal findings: Secondary | ICD-10-CM | POA: Diagnosis not present

## 2014-06-17 NOTE — Progress Notes (Addendum)
Subjective:    [name] is a 59 y.o. male who presents for Medicare Annual/Subsequent preventive examination.   Preventive Screening-Counseling & Management  Tobacco History  Smoking status  . Never Smoker   Smokeless tobacco  . Never Used    Problems Prior to Visit 1. Diabetes Mellitus, II -- previously well controlled.  Last A1C 6.5 in 11/2013.  Endorses CBGs averaging 115 fasting.  2. ESRD -- Followed by Nephrology. Has dialysis three times weekly. 3. Hypertension -- Currently controlled with diet and exercise.  4. 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  Is doing PT twice weekly. Needs Rx for new cane and wheelchair.  Current Problems (verified) Patient Active Problem List   Diagnosis Date Noted  . Calculus of gallbladder with acute cholecystitis and obstruction 12/23/2013  . Hyperlipidemia 12/23/2013  . Wound of right leg 11/20/2013  . Diabetic foot 11/20/2013  . Chronic pain 05/21/2013  . Venous stasis ulcer of right lower extremity 05/21/2013  . Amputated below knee 05/17/2013  . Chronic interstitial cystitis 02/06/2013  . On anticoagulant therapy 12/09/2012  . Preventive measure 11/13/2012  . Renal failure, acute on chronic 10/30/2012  . Septic shock 10/29/2012  . UI (urinary incontinence) 10/29/2012  . CKD (chronic kidney disease) stage 3, GFR 30-59 ml/min 08/18/2012  . AKI (acute kidney injury) 08/14/2012  . Cellulitis 08/14/2012  . Nephrolithiasis 08/14/2012  . Acute respiratory failure with hypoxia 08/14/2012  . Metabolic acidosis [id number]  . Hypoglycemia associated with diabetes 08/04/2012  . Hypothyroidism 08/04/2012  . HYPERLIPIDEMIA 09/21/2009  . OBESITY 09/21/2009  . Acute posthemorrhagic anemia 09/21/2009  . ANEMIA-UNSPECIFIED 09/21/2009  . Essential hypertension 09/21/2009  . DIVERTICULOSIS OF COLON 09/21/2009  . BLOOD IN STOOL 09/21/2009  . Diabetes mellitus type II, controlled 10/01/2007  . DVT 10/01/2007    Medications Prior  to Visit Current Outpatient Prescriptions on File Prior to Visit  Medication Sig Dispense Refill  . B-D INS SYRINGE 0.5CC/31GX5/16 31G X 5/16" 0.5 ML MISC USE 4 TIMES A DAY FOR DIABETES 100 each 4  . ferrous sulfate 325 (65 FE) MG tablet Take 325 mg by mouth 2 (two) times daily.     Marland Kitchen glucose blood (BAYER CONTOUR TEST) test strip USE AS DIRECTED TO CHECK BLOOD SUGARS TWICE DAILY Dx: E11.9 100 each 3  . insulin glargine (LANTUS) 100 UNIT/ML injection Inject 0.4 mLs (40 Units total) into the skin at bedtime. 40 mL 5  . levothyroxine (SYNTHROID, LEVOTHROID) 25 MCG tablet TAKE 1 TABLET (25 MCG TOTAL) BY MOUTH DAILY BEFORE BREAKFAST. 30 tablet 2  . Multiple Vitamins-Minerals (MULTIVITAMINS THER. W/MINERALS) TABS Take 1 tablet by mouth daily.     Marland Kitchen NOVOLOG 100 UNIT/ML injection INJECT 3 UNITS THREE TIMES DAILY WITH MEALS. AND AT NIGHTIME IF BLOOD SUGARS GREATER THAN 150 20 mL 1  . sevelamer carbonate (RENVELA) 800 MG tablet Take 800 mg by mouth 3 (three) times daily with meals.     No current facility-administered medications on file prior to visit.    Current Medications (verified) Current Outpatient Prescriptions  Medication Sig Dispense Refill  . B-D INS SYRINGE 0.5CC/31GX5/16 31G X 5/16" 0.5 ML MISC USE 4 TIMES A DAY FOR DIABETES 100 each 4  . ferrous sulfate 325 (65 FE) MG tablet Take 325 mg by mouth 2 (two) times daily.     Marland Kitchen glucose blood (BAYER CONTOUR TEST) test strip USE AS DIRECTED TO CHECK BLOOD SUGARS TWICE DAILY Dx: E11.9 100 each 3  . insulin glargine (LANTUS)  100 UNIT/ML injection Inject 0.4 mLs (40 Units total) into the skin at bedtime. 40 mL 5  . levothyroxine (SYNTHROID, LEVOTHROID) 25 MCG tablet TAKE 1 TABLET (25 MCG TOTAL) BY MOUTH DAILY BEFORE BREAKFAST. 30 tablet 2  . Multiple Vitamins-Minerals (MULTIVITAMINS THER. W/MINERALS) TABS Take 1 tablet by mouth daily.     Marland Kitchen NOVOLOG 100 UNIT/ML injection INJECT 3 UNITS THREE TIMES DAILY WITH MEALS. AND AT NIGHTIME IF BLOOD SUGARS  GREATER THAN 150 20 mL 1  . sevelamer carbonate (RENVELA) 800 MG tablet Take 800 mg by mouth 3 (three) times daily with meals.    Marland Kitchen atorvastatin (LIPITOR) 40 MG tablet TAKE 1 TABLET EVERY DAY 30 tablet 2  . levothyroxine (SYNTHROID, LEVOTHROID) 200 MCG tablet TAKE 1 TABLET (200 MCG TOTAL) BY MOUTH EVERY MORNING. 30 tablet 5   No current facility-administered medications for this visit.     Allergies (verified) Review of patient's allergies indicates no known allergies.   PAST HISTORY  Family History Family History  Problem Relation Age of Onset  . Diabetes Mother   . Heart attack Mother   . Stroke Mother   . Colon cancer Father   . Dementia Father   . Heart attack Father   . Arthritis Paternal Grandmother   . Hypertension Other   . Hypothyroidism Other     Social History History  Substance Use Topics  . Smoking status: Never Smoker   . Smokeless tobacco: Never Used  . Alcohol Use: No    Are there smokers in your home (other than you)?  No  Risk Factors Current exercise habits: The patient does not participate in regular exercise at present.  Dietary issues discussed: Body mass index is 35.08 kg/(m^2). Well-balanced diet discussed with patient.    Cardiac risk factors: advanced age (older than 88 for men, 26 for women), diabetes mellitus, dyslipidemia, family history of premature cardiovascular disease, hypertension, male gender, obesity (BMI >= 30 kg/m2) and sedentary lifestyle.  Depression Screen (Note: if answer to either of the following is "Yes", a more complete depression screening is indicated)   Q1: Over the past two weeks, have you felt down, depressed or hopeless? No  Q2: Over the past two weeks, have you felt little interest or pleasure in doing things? No  Have you lost interest or pleasure in daily life? No  Do you often feel hopeless? No  Do you cry easily over simple problems? No  Activities of Daily Living In your present state of health, do you  have any difficulty performing the following activities?:  Driving? Yes Managing money?  No Feeding yourself? No Getting from bed to chair? Yes Climbing a flight of stairs? Yes Preparing food and eating?: No Bathing or showering? Yes Getting dressed: Yes Getting to the toilet? Yes Using the toilet:No Moving around from place to place: Yes In the past year have you fallen or had a near fall?:No   Are you sexually active?  No  Do you have more than one partner?  N/A  Hearing Difficulties: No Do you often ask people to speak up or repeat themselves? No Do you experience ringing or noises in your ears? No Do you have difficulty understanding soft or whispered voices? No   Do you feel that you have a problem with memory? No  Do you often misplace items? No  Do you feel safe at home?  Yes  Cognitive Testing  Alert? Yes  Normal Appearance?Yes  Oriented to person? Yes  Place? Yes  Time? Yes  Recall of three objects?  Yes  Can perform simple calculations? Yes  Displays appropriate judgment?Yes  Can read the correct time from a watch face?Yes   Advanced Directives have been discussed with the patient? Yes   List the Names of Other Physician/Practitioners you currently use: 1.  Nephrology --   Indicate any recent Medical Services you may have received from other than [facility] providers in the past year (date may be approximate).  Immunization History  Administered Date(s) Administered  . Pneumococcal Polysaccharide-23 08/15/2012    Screening Tests Health Maintenance  Topic Date Due  . TETANUS/TDAP  11/08/1976  . HEMOGLOBIN A1C  06/25/2014  . Hepatitis C Screening  06/17/2019 (Originally [birth year])  . HIV Screening  06/17/2019 (Originally 11/08/1970)  . INFLUENZA VACCINE  09/28/2014  . FOOT EXAM  11/13/2014  . OPHTHALMOLOGY EXAM  11/13/2014  . URINE MICROALBUMIN  12/17/2014  . PNEUMOCOCCAL POLYSACCHARIDE VACCINE (2) 08/15/2017  . COLONOSCOPY  11/12/2022    All answers  were reviewed with the patient and necessary referrals were made:  Leeanne Rio, PA-C   07/08/2014   History reviewed: allergies, current medications, past family history, past medical history, past social history, past surgical history and problem list  Review of Systems A comprehensive review of systems was negative.    Objective:     Vision by Snellen chart: right eye:20/30, left eye:20/30 Blood pressure 112/64, pulse 76, temperature 97.5 F (36.4 C), temperature source Oral, resp. rate 16, height 5\' 11"  (1.803 m), weight 251 lb 7 oz (114.051 kg), SpO2 100 %. Body mass index is 35.08 kg/(m^2).  General appearance: alert, cooperative, appears stated age and no distress Head: Normocephalic, without obvious abnormality, atraumatic Eyes: conjunctivae/corneas clear. PERRL, EOM's intact. Fundi benign. Ears: normal TM's and external ear canals both ears Nose: Nares normal. Septum midline. Mucosa normal. No drainage or sinus tenderness. Throat: lips, mucosa, and tongue normal; teeth and gums normal Lungs: clear to auscultation bilaterally Heart: regular rate and rhythm, S1, S2 normal, no murmur, click, rub or gallop Pulses: 2+ and symmetric     Assessment:     (1) Medicare Wellness Subsequent (2) Diabetes Mellitus II, Controlled (3) Hypertension (4) ESRD (5) Below Knee Amputation     Plan:     (1)During the course of the visit the patient was educated and counseled about appropriate screening and preventive services including:    Td vaccine  Prostate cancer screening  Nutrition counseling   Advanced directives: has NO advanced directive - not interested in additional information  Diet review for nutrition referral? Yes ____  Not Indicated __x__  (2) Will have RN at dialysis draw BMP, UA, Urine micro, A1C.  Continue current regimen.  Diabetic Foot Exam up-to-date. Patient refuses Ophthalmology referral.  (3) BP normotensive.  Continue diet and physical  activity.  Will continue to monitor BP.  (4) Continue Dialysis as directed.  (5) Patient suffers from left leg BKA amputation and CKD which impairs his ability to perform ADLs and be independently mobile without assistance of a wheelchair.  Current wheelchair is in tatters and does not function properly.  [facility] for a Standard Manual wheelchair with seat cushion.  Fax received.   Patient Instructions (the written plan) was given to the patient.  Medicare Attestation I have personally reviewed: The patient's medical and social history Their use of alcohol, tobacco or illicit drugs Their current medications and supplements The patient's functional ability including ADLs,fall risks, home safety risks,  cognitive, and hearing and visual impairment Diet and physical activities Evidence for depression or mood disorders  The patient's weight, height, BMI, and visual acuity have been recorded in the chart.  I have made referrals, counseling, and provided education to the patient based on review of the above and I have provided the patient with a written personalized care plan for preventive services.     Raiford Noble Nettie, [place]   07/08/2014

## 2014-06-17 NOTE — Progress Notes (Signed)
Pre visit review using our clinic review tool, if applicable. No additional management support is needed unless otherwise documented below in the visit note/SLS  

## 2014-06-17 NOTE — Patient Instructions (Signed)
Please continue medications as directed. Follow-up with Nephrology and Dialysis as scheduled. You will be contacted regarding diabetic footwear.  We will fax over the prescriptions for the walker and wheelchair to [facility]. Please give the lab orders to Dialysis tomorrow to draw and fax over.

## 2014-06-18 ENCOUNTER — Telehealth: Payer: Self-pay | Admitting: Physician Assistant

## 2014-06-18 ENCOUNTER — Other Ambulatory Visit: Payer: Self-pay | Admitting: Physician Assistant

## 2014-06-18 DIAGNOSIS — N186 End stage renal disease: Secondary | ICD-10-CM | POA: Diagnosis not present

## 2014-06-18 DIAGNOSIS — D631 Anemia in chronic kidney disease: Secondary | ICD-10-CM | POA: Diagnosis not present

## 2014-06-18 DIAGNOSIS — D509 Iron deficiency anemia, unspecified: Secondary | ICD-10-CM | POA: Diagnosis not present

## 2014-06-18 DIAGNOSIS — Z89512 Acquired absence of left leg below knee: Secondary | ICD-10-CM

## 2014-06-18 DIAGNOSIS — N2581 Secondary hyperparathyroidism of renal origin: Secondary | ICD-10-CM | POA: Diagnosis not present

## 2014-06-18 NOTE — Telephone Encounter (Signed)
Caller name: Lenna Sciara from [place]  Call back number: [phone]   Reason for call:  [place] requesting order for wheel chair. Requesting a order place in epic. Melissa requesting a phone call because things have changed regarding pt wheelchair. Please follow up

## 2014-06-19 DIAGNOSIS — Z992 Dependence on renal dialysis: Secondary | ICD-10-CM | POA: Diagnosis not present

## 2014-06-19 DIAGNOSIS — Z48816 Encounter for surgical aftercare following surgery on the genitourinary system: Secondary | ICD-10-CM | POA: Diagnosis not present

## 2014-06-19 DIAGNOSIS — E119 Type 2 diabetes mellitus without complications: Secondary | ICD-10-CM | POA: Diagnosis not present

## 2014-06-19 DIAGNOSIS — Z89512 Acquired absence of left leg below knee: Secondary | ICD-10-CM | POA: Diagnosis not present

## 2014-06-19 DIAGNOSIS — N186 End stage renal disease: Secondary | ICD-10-CM | POA: Diagnosis not present

## 2014-06-19 DIAGNOSIS — I120 Hypertensive chronic kidney disease with stage 5 chronic kidney disease or end stage renal disease: Secondary | ICD-10-CM | POA: Diagnosis not present

## 2014-06-19 NOTE — Telephone Encounter (Signed)
LMOM with contact name and number [for return call, if needed] RE: physician order faxed for DME wheelchair Attn: Melissa Stenson/SLS

## 2014-06-20 DIAGNOSIS — N186 End stage renal disease: Secondary | ICD-10-CM | POA: Diagnosis not present

## 2014-06-20 DIAGNOSIS — D631 Anemia in chronic kidney disease: Secondary | ICD-10-CM | POA: Diagnosis not present

## 2014-06-20 DIAGNOSIS — N2581 Secondary hyperparathyroidism of renal origin: Secondary | ICD-10-CM | POA: Diagnosis not present

## 2014-06-20 DIAGNOSIS — D509 Iron deficiency anemia, unspecified: Secondary | ICD-10-CM | POA: Diagnosis not present

## 2014-06-22 DIAGNOSIS — Z48816 Encounter for surgical aftercare following surgery on the genitourinary system: Secondary | ICD-10-CM | POA: Diagnosis not present

## 2014-06-22 DIAGNOSIS — Z992 Dependence on renal dialysis: Secondary | ICD-10-CM | POA: Diagnosis not present

## 2014-06-22 DIAGNOSIS — E119 Type 2 diabetes mellitus without complications: Secondary | ICD-10-CM | POA: Diagnosis not present

## 2014-06-22 DIAGNOSIS — I120 Hypertensive chronic kidney disease with stage 5 chronic kidney disease or end stage renal disease: Secondary | ICD-10-CM | POA: Diagnosis not present

## 2014-06-22 DIAGNOSIS — N186 End stage renal disease: Secondary | ICD-10-CM | POA: Diagnosis not present

## 2014-06-22 DIAGNOSIS — Z89512 Acquired absence of left leg below knee: Secondary | ICD-10-CM | POA: Diagnosis not present

## 2014-06-23 ENCOUNTER — Telehealth: Payer: Self-pay | Admitting: Physician Assistant

## 2014-06-23 DIAGNOSIS — D509 Iron deficiency anemia, unspecified: Secondary | ICD-10-CM | POA: Diagnosis not present

## 2014-06-23 DIAGNOSIS — N186 End stage renal disease: Secondary | ICD-10-CM | POA: Diagnosis not present

## 2014-06-23 DIAGNOSIS — D631 Anemia in chronic kidney disease: Secondary | ICD-10-CM | POA: Diagnosis not present

## 2014-06-23 DIAGNOSIS — N2581 Secondary hyperparathyroidism of renal origin: Secondary | ICD-10-CM | POA: Diagnosis not present

## 2014-06-23 NOTE — Telephone Encounter (Signed)
Patient was already given Rx for lab orders at his appointment. Did he lose this?  If so ok, to fax order for cbc, BMP, A1C, UA, Urine microalbumin and lipid panel.  Dx: Diabetes Mellitus, controlled, Hyperlipidemia, and CKD.   In terms of diabetic footwear I have re-faxed order to the Prosthetic company.

## 2014-06-23 NOTE — Telephone Encounter (Signed)
Relation to pt: self  Call back number: [phone]   Reason for call:  Pt requesting a lab RX for medicare wellness please fax to [facility] phone [phone] and fax [phone].  Pt requesting a RX for diabetic shoes please fax to [facility], pt states we faxed to [facility] therefore the fax number should be on file.

## 2014-06-24 DIAGNOSIS — Z992 Dependence on renal dialysis: Secondary | ICD-10-CM | POA: Diagnosis not present

## 2014-06-24 DIAGNOSIS — I129 Hypertensive chronic kidney disease with stage 1 through stage 4 chronic kidney disease, or unspecified chronic kidney disease: Secondary | ICD-10-CM | POA: Diagnosis not present

## 2014-06-24 DIAGNOSIS — I120 Hypertensive chronic kidney disease with stage 5 chronic kidney disease or end stage renal disease: Secondary | ICD-10-CM | POA: Diagnosis not present

## 2014-06-24 DIAGNOSIS — E039 Hypothyroidism, unspecified: Secondary | ICD-10-CM | POA: Diagnosis not present

## 2014-06-24 DIAGNOSIS — N186 End stage renal disease: Secondary | ICD-10-CM | POA: Diagnosis not present

## 2014-06-24 DIAGNOSIS — Z89512 Acquired absence of left leg below knee: Secondary | ICD-10-CM | POA: Diagnosis not present

## 2014-06-24 DIAGNOSIS — I809 Phlebitis and thrombophlebitis of unspecified site: Secondary | ICD-10-CM | POA: Diagnosis not present

## 2014-06-24 DIAGNOSIS — Z48816 Encounter for surgical aftercare following surgery on the genitourinary system: Secondary | ICD-10-CM | POA: Diagnosis not present

## 2014-06-24 DIAGNOSIS — E119 Type 2 diabetes mellitus without complications: Secondary | ICD-10-CM | POA: Diagnosis not present

## 2014-06-24 DIAGNOSIS — L98493 Non-pressure chronic ulcer of skin of other sites with necrosis of muscle: Secondary | ICD-10-CM | POA: Diagnosis not present

## 2014-06-24 DIAGNOSIS — E1140 Type 2 diabetes mellitus with diabetic neuropathy, unspecified: Secondary | ICD-10-CM | POA: Diagnosis not present

## 2014-06-24 NOTE — Telephone Encounter (Signed)
Called Darlina Guys at [facility] and left a detailed message requesting either a phone call back or a fax to our office detailing the orders for the wheelchair that is needed. JG//CMA

## 2014-06-25 ENCOUNTER — Other Ambulatory Visit: Payer: Self-pay | Admitting: Physician Assistant

## 2014-06-25 DIAGNOSIS — N2581 Secondary hyperparathyroidism of renal origin: Secondary | ICD-10-CM | POA: Diagnosis not present

## 2014-06-25 DIAGNOSIS — N186 End stage renal disease: Secondary | ICD-10-CM | POA: Diagnosis not present

## 2014-06-25 DIAGNOSIS — D509 Iron deficiency anemia, unspecified: Secondary | ICD-10-CM | POA: Diagnosis not present

## 2014-06-25 DIAGNOSIS — D631 Anemia in chronic kidney disease: Secondary | ICD-10-CM | POA: Diagnosis not present

## 2014-06-25 NOTE — Telephone Encounter (Signed)
Patient states that prosthetic company has not received fax. Has appointment for tomorrow and is requesting to refax today.

## 2014-06-26 DIAGNOSIS — N186 End stage renal disease: Secondary | ICD-10-CM | POA: Diagnosis not present

## 2014-06-26 DIAGNOSIS — E119 Type 2 diabetes mellitus without complications: Secondary | ICD-10-CM | POA: Diagnosis not present

## 2014-06-26 DIAGNOSIS — Z48816 Encounter for surgical aftercare following surgery on the genitourinary system: Secondary | ICD-10-CM | POA: Diagnosis not present

## 2014-06-26 DIAGNOSIS — Z992 Dependence on renal dialysis: Secondary | ICD-10-CM | POA: Diagnosis not present

## 2014-06-26 DIAGNOSIS — I120 Hypertensive chronic kidney disease with stage 5 chronic kidney disease or end stage renal disease: Secondary | ICD-10-CM | POA: Diagnosis not present

## 2014-06-26 DIAGNOSIS — Z89512 Acquired absence of left leg below knee: Secondary | ICD-10-CM | POA: Diagnosis not present

## 2014-06-26 NOTE — Telephone Encounter (Signed)
Fax confirmation received. 

## 2014-06-26 NOTE — Telephone Encounter (Signed)
Spoke with pt. Forms faxed to correct office. Pt stated he found lab orders as well in his paperwork. JG//CMA

## 2014-06-26 NOTE — Telephone Encounter (Signed)
Pt called to confirm fax number as per the office it sounded as if someone was sending fax to land line. Confirming fax [phone]

## 2014-06-27 DIAGNOSIS — D631 Anemia in chronic kidney disease: Secondary | ICD-10-CM | POA: Diagnosis not present

## 2014-06-27 DIAGNOSIS — N2581 Secondary hyperparathyroidism of renal origin: Secondary | ICD-10-CM | POA: Diagnosis not present

## 2014-06-27 DIAGNOSIS — D509 Iron deficiency anemia, unspecified: Secondary | ICD-10-CM | POA: Diagnosis not present

## 2014-06-27 DIAGNOSIS — N186 End stage renal disease: Secondary | ICD-10-CM | POA: Diagnosis not present

## 2014-06-28 DIAGNOSIS — N186 End stage renal disease: Secondary | ICD-10-CM | POA: Diagnosis not present

## 2014-06-28 DIAGNOSIS — D649 Anemia, unspecified: Secondary | ICD-10-CM | POA: Diagnosis not present

## 2014-06-28 DIAGNOSIS — I10 Essential (primary) hypertension: Secondary | ICD-10-CM | POA: Diagnosis not present

## 2014-06-29 DIAGNOSIS — N186 End stage renal disease: Secondary | ICD-10-CM | POA: Diagnosis not present

## 2014-06-29 DIAGNOSIS — Z48816 Encounter for surgical aftercare following surgery on the genitourinary system: Secondary | ICD-10-CM | POA: Diagnosis not present

## 2014-06-29 DIAGNOSIS — I120 Hypertensive chronic kidney disease with stage 5 chronic kidney disease or end stage renal disease: Secondary | ICD-10-CM | POA: Diagnosis not present

## 2014-06-29 DIAGNOSIS — E119 Type 2 diabetes mellitus without complications: Secondary | ICD-10-CM | POA: Diagnosis not present

## 2014-06-29 DIAGNOSIS — Z992 Dependence on renal dialysis: Secondary | ICD-10-CM | POA: Diagnosis not present

## 2014-06-29 DIAGNOSIS — Z89512 Acquired absence of left leg below knee: Secondary | ICD-10-CM | POA: Diagnosis not present

## 2014-06-30 DIAGNOSIS — D509 Iron deficiency anemia, unspecified: Secondary | ICD-10-CM | POA: Diagnosis not present

## 2014-06-30 DIAGNOSIS — D631 Anemia in chronic kidney disease: Secondary | ICD-10-CM | POA: Diagnosis not present

## 2014-06-30 DIAGNOSIS — N186 End stage renal disease: Secondary | ICD-10-CM | POA: Diagnosis not present

## 2014-07-02 DIAGNOSIS — N186 End stage renal disease: Secondary | ICD-10-CM | POA: Diagnosis not present

## 2014-07-02 DIAGNOSIS — E1165 Type 2 diabetes mellitus with hyperglycemia: Secondary | ICD-10-CM | POA: Diagnosis not present

## 2014-07-02 DIAGNOSIS — D631 Anemia in chronic kidney disease: Secondary | ICD-10-CM | POA: Diagnosis not present

## 2014-07-02 DIAGNOSIS — D509 Iron deficiency anemia, unspecified: Secondary | ICD-10-CM | POA: Diagnosis not present

## 2014-07-02 DIAGNOSIS — K769 Liver disease, unspecified: Secondary | ICD-10-CM | POA: Diagnosis not present

## 2014-07-03 DIAGNOSIS — Z992 Dependence on renal dialysis: Secondary | ICD-10-CM | POA: Diagnosis not present

## 2014-07-03 DIAGNOSIS — Z89512 Acquired absence of left leg below knee: Secondary | ICD-10-CM | POA: Diagnosis not present

## 2014-07-03 DIAGNOSIS — N186 End stage renal disease: Secondary | ICD-10-CM | POA: Diagnosis not present

## 2014-07-03 DIAGNOSIS — E119 Type 2 diabetes mellitus without complications: Secondary | ICD-10-CM | POA: Diagnosis not present

## 2014-07-03 DIAGNOSIS — I120 Hypertensive chronic kidney disease with stage 5 chronic kidney disease or end stage renal disease: Secondary | ICD-10-CM | POA: Diagnosis not present

## 2014-07-03 DIAGNOSIS — Z48816 Encounter for surgical aftercare following surgery on the genitourinary system: Secondary | ICD-10-CM | POA: Diagnosis not present

## 2014-07-04 DIAGNOSIS — D509 Iron deficiency anemia, unspecified: Secondary | ICD-10-CM | POA: Diagnosis not present

## 2014-07-04 DIAGNOSIS — N186 End stage renal disease: Secondary | ICD-10-CM | POA: Diagnosis not present

## 2014-07-04 DIAGNOSIS — D631 Anemia in chronic kidney disease: Secondary | ICD-10-CM | POA: Diagnosis not present

## 2014-07-06 DIAGNOSIS — N186 End stage renal disease: Secondary | ICD-10-CM | POA: Diagnosis not present

## 2014-07-06 DIAGNOSIS — Z89512 Acquired absence of left leg below knee: Secondary | ICD-10-CM | POA: Diagnosis not present

## 2014-07-06 DIAGNOSIS — L98493 Non-pressure chronic ulcer of skin of other sites with necrosis of muscle: Secondary | ICD-10-CM | POA: Diagnosis not present

## 2014-07-06 DIAGNOSIS — Z992 Dependence on renal dialysis: Secondary | ICD-10-CM | POA: Diagnosis not present

## 2014-07-06 DIAGNOSIS — E119 Type 2 diabetes mellitus without complications: Secondary | ICD-10-CM | POA: Diagnosis not present

## 2014-07-06 DIAGNOSIS — I120 Hypertensive chronic kidney disease with stage 5 chronic kidney disease or end stage renal disease: Secondary | ICD-10-CM | POA: Diagnosis not present

## 2014-07-06 DIAGNOSIS — Z48816 Encounter for surgical aftercare following surgery on the genitourinary system: Secondary | ICD-10-CM | POA: Diagnosis not present

## 2014-07-07 DIAGNOSIS — D509 Iron deficiency anemia, unspecified: Secondary | ICD-10-CM | POA: Diagnosis not present

## 2014-07-07 DIAGNOSIS — D631 Anemia in chronic kidney disease: Secondary | ICD-10-CM | POA: Diagnosis not present

## 2014-07-07 DIAGNOSIS — N186 End stage renal disease: Secondary | ICD-10-CM | POA: Diagnosis not present

## 2014-07-08 DIAGNOSIS — N289 Disorder of kidney and ureter, unspecified: Secondary | ICD-10-CM | POA: Diagnosis not present

## 2014-07-08 DIAGNOSIS — N186 End stage renal disease: Secondary | ICD-10-CM | POA: Diagnosis not present

## 2014-07-08 DIAGNOSIS — Z89512 Acquired absence of left leg below knee: Secondary | ICD-10-CM | POA: Diagnosis not present

## 2014-07-08 DIAGNOSIS — E119 Type 2 diabetes mellitus without complications: Secondary | ICD-10-CM | POA: Diagnosis not present

## 2014-07-08 DIAGNOSIS — I129 Hypertensive chronic kidney disease with stage 1 through stage 4 chronic kidney disease, or unspecified chronic kidney disease: Secondary | ICD-10-CM | POA: Diagnosis not present

## 2014-07-08 DIAGNOSIS — E1140 Type 2 diabetes mellitus with diabetic neuropathy, unspecified: Secondary | ICD-10-CM | POA: Diagnosis not present

## 2014-07-08 DIAGNOSIS — Z992 Dependence on renal dialysis: Secondary | ICD-10-CM | POA: Diagnosis not present

## 2014-07-08 DIAGNOSIS — M1990 Unspecified osteoarthritis, unspecified site: Secondary | ICD-10-CM | POA: Diagnosis not present

## 2014-07-08 DIAGNOSIS — Z48816 Encounter for surgical aftercare following surgery on the genitourinary system: Secondary | ICD-10-CM | POA: Diagnosis not present

## 2014-07-08 DIAGNOSIS — I809 Phlebitis and thrombophlebitis of unspecified site: Secondary | ICD-10-CM | POA: Diagnosis not present

## 2014-07-08 DIAGNOSIS — Z794 Long term (current) use of insulin: Secondary | ICD-10-CM | POA: Diagnosis not present

## 2014-07-08 DIAGNOSIS — I120 Hypertensive chronic kidney disease with stage 5 chronic kidney disease or end stage renal disease: Secondary | ICD-10-CM | POA: Diagnosis not present

## 2014-07-08 DIAGNOSIS — E039 Hypothyroidism, unspecified: Secondary | ICD-10-CM | POA: Diagnosis not present

## 2014-07-08 DIAGNOSIS — L98493 Non-pressure chronic ulcer of skin of other sites with necrosis of muscle: Secondary | ICD-10-CM | POA: Diagnosis not present

## 2014-07-08 DIAGNOSIS — E11622 Type 2 diabetes mellitus with other skin ulcer: Secondary | ICD-10-CM | POA: Diagnosis not present

## 2014-07-08 NOTE — Assessment & Plan Note (Signed)
Patient suffers from left leg BKA amputation and CKD which impairs his ability to perform ADLs and be independently mobile without assistance of a wheelchair.  Current wheelchair is in tatters and does not function properly.  [facility] for a Standard Manual wheelchair with seat cushion.  Fax received.

## 2014-07-09 DIAGNOSIS — N186 End stage renal disease: Secondary | ICD-10-CM | POA: Diagnosis not present

## 2014-07-09 DIAGNOSIS — D509 Iron deficiency anemia, unspecified: Secondary | ICD-10-CM | POA: Diagnosis not present

## 2014-07-09 DIAGNOSIS — D631 Anemia in chronic kidney disease: Secondary | ICD-10-CM | POA: Diagnosis not present

## 2014-07-10 DIAGNOSIS — N186 End stage renal disease: Secondary | ICD-10-CM | POA: Diagnosis not present

## 2014-07-10 DIAGNOSIS — E119 Type 2 diabetes mellitus without complications: Secondary | ICD-10-CM | POA: Diagnosis not present

## 2014-07-10 DIAGNOSIS — Z48816 Encounter for surgical aftercare following surgery on the genitourinary system: Secondary | ICD-10-CM | POA: Diagnosis not present

## 2014-07-10 DIAGNOSIS — Z89512 Acquired absence of left leg below knee: Secondary | ICD-10-CM | POA: Diagnosis not present

## 2014-07-10 DIAGNOSIS — Z992 Dependence on renal dialysis: Secondary | ICD-10-CM | POA: Diagnosis not present

## 2014-07-10 DIAGNOSIS — I120 Hypertensive chronic kidney disease with stage 5 chronic kidney disease or end stage renal disease: Secondary | ICD-10-CM | POA: Diagnosis not present

## 2014-07-11 DIAGNOSIS — N186 End stage renal disease: Secondary | ICD-10-CM | POA: Diagnosis not present

## 2014-07-11 DIAGNOSIS — D631 Anemia in chronic kidney disease: Secondary | ICD-10-CM | POA: Diagnosis not present

## 2014-07-11 DIAGNOSIS — D509 Iron deficiency anemia, unspecified: Secondary | ICD-10-CM | POA: Diagnosis not present

## 2014-07-12 DIAGNOSIS — Z794 Long term (current) use of insulin: Secondary | ICD-10-CM | POA: Diagnosis not present

## 2014-07-12 DIAGNOSIS — N186 End stage renal disease: Secondary | ICD-10-CM | POA: Diagnosis not present

## 2014-07-12 DIAGNOSIS — Z89512 Acquired absence of left leg below knee: Secondary | ICD-10-CM | POA: Diagnosis not present

## 2014-07-12 DIAGNOSIS — Z992 Dependence on renal dialysis: Secondary | ICD-10-CM | POA: Diagnosis not present

## 2014-07-12 DIAGNOSIS — Z48816 Encounter for surgical aftercare following surgery on the genitourinary system: Secondary | ICD-10-CM | POA: Diagnosis not present

## 2014-07-12 DIAGNOSIS — I120 Hypertensive chronic kidney disease with stage 5 chronic kidney disease or end stage renal disease: Secondary | ICD-10-CM | POA: Diagnosis not present

## 2014-07-12 DIAGNOSIS — E119 Type 2 diabetes mellitus without complications: Secondary | ICD-10-CM | POA: Diagnosis not present

## 2014-07-12 DIAGNOSIS — Z936 Other artificial openings of urinary tract status: Secondary | ICD-10-CM | POA: Diagnosis not present

## 2014-07-13 ENCOUNTER — Telehealth: Payer: Self-pay | Admitting: Physician Assistant

## 2014-07-13 NOTE — Telephone Encounter (Signed)
PT informed of PCP instructions.

## 2014-07-13 NOTE — Telephone Encounter (Signed)
Ok to continue PT for strengthening and gait stability.  Can continue for next 3 months. They can send paperwork over for MD signature.

## 2014-07-13 NOTE — Telephone Encounter (Signed)
Caller name: chris dinan from [facility] Relation to pt: Call back [phone] Pharmacy:  Reason for call:   Needs orders to continue PT. Please leave detailed message.

## 2014-07-14 DIAGNOSIS — N186 End stage renal disease: Secondary | ICD-10-CM | POA: Diagnosis not present

## 2014-07-14 DIAGNOSIS — D509 Iron deficiency anemia, unspecified: Secondary | ICD-10-CM | POA: Diagnosis not present

## 2014-07-14 DIAGNOSIS — D631 Anemia in chronic kidney disease: Secondary | ICD-10-CM | POA: Diagnosis not present

## 2014-07-15 DIAGNOSIS — Z48816 Encounter for surgical aftercare following surgery on the genitourinary system: Secondary | ICD-10-CM | POA: Diagnosis not present

## 2014-07-15 DIAGNOSIS — Z992 Dependence on renal dialysis: Secondary | ICD-10-CM | POA: Diagnosis not present

## 2014-07-15 DIAGNOSIS — N186 End stage renal disease: Secondary | ICD-10-CM | POA: Diagnosis not present

## 2014-07-15 DIAGNOSIS — Z89512 Acquired absence of left leg below knee: Secondary | ICD-10-CM | POA: Diagnosis not present

## 2014-07-15 DIAGNOSIS — E119 Type 2 diabetes mellitus without complications: Secondary | ICD-10-CM | POA: Diagnosis not present

## 2014-07-15 DIAGNOSIS — I120 Hypertensive chronic kidney disease with stage 5 chronic kidney disease or end stage renal disease: Secondary | ICD-10-CM | POA: Diagnosis not present

## 2014-07-16 DIAGNOSIS — D631 Anemia in chronic kidney disease: Secondary | ICD-10-CM | POA: Diagnosis not present

## 2014-07-16 DIAGNOSIS — N186 End stage renal disease: Secondary | ICD-10-CM | POA: Diagnosis not present

## 2014-07-16 DIAGNOSIS — D509 Iron deficiency anemia, unspecified: Secondary | ICD-10-CM | POA: Diagnosis not present

## 2014-07-17 DIAGNOSIS — D631 Anemia in chronic kidney disease: Secondary | ICD-10-CM | POA: Diagnosis not present

## 2014-07-17 DIAGNOSIS — Z992 Dependence on renal dialysis: Secondary | ICD-10-CM | POA: Diagnosis not present

## 2014-07-17 DIAGNOSIS — N186 End stage renal disease: Secondary | ICD-10-CM | POA: Diagnosis not present

## 2014-07-17 DIAGNOSIS — Z48816 Encounter for surgical aftercare following surgery on the genitourinary system: Secondary | ICD-10-CM | POA: Diagnosis not present

## 2014-07-17 DIAGNOSIS — E119 Type 2 diabetes mellitus without complications: Secondary | ICD-10-CM | POA: Diagnosis not present

## 2014-07-17 DIAGNOSIS — D509 Iron deficiency anemia, unspecified: Secondary | ICD-10-CM | POA: Diagnosis not present

## 2014-07-17 DIAGNOSIS — I120 Hypertensive chronic kidney disease with stage 5 chronic kidney disease or end stage renal disease: Secondary | ICD-10-CM | POA: Diagnosis not present

## 2014-07-17 DIAGNOSIS — Z89512 Acquired absence of left leg below knee: Secondary | ICD-10-CM | POA: Diagnosis not present

## 2014-07-18 ENCOUNTER — Other Ambulatory Visit: Payer: Self-pay | Admitting: Physician Assistant

## 2014-07-20 DIAGNOSIS — D631 Anemia in chronic kidney disease: Secondary | ICD-10-CM | POA: Diagnosis not present

## 2014-07-20 DIAGNOSIS — D509 Iron deficiency anemia, unspecified: Secondary | ICD-10-CM | POA: Diagnosis not present

## 2014-07-20 DIAGNOSIS — N186 End stage renal disease: Secondary | ICD-10-CM | POA: Diagnosis not present

## 2014-07-21 DIAGNOSIS — I120 Hypertensive chronic kidney disease with stage 5 chronic kidney disease or end stage renal disease: Secondary | ICD-10-CM | POA: Diagnosis not present

## 2014-07-21 DIAGNOSIS — E119 Type 2 diabetes mellitus without complications: Secondary | ICD-10-CM | POA: Diagnosis not present

## 2014-07-21 DIAGNOSIS — Z48816 Encounter for surgical aftercare following surgery on the genitourinary system: Secondary | ICD-10-CM | POA: Diagnosis not present

## 2014-07-21 DIAGNOSIS — N186 End stage renal disease: Secondary | ICD-10-CM | POA: Diagnosis not present

## 2014-07-21 DIAGNOSIS — Z89512 Acquired absence of left leg below knee: Secondary | ICD-10-CM | POA: Diagnosis not present

## 2014-07-21 DIAGNOSIS — Z992 Dependence on renal dialysis: Secondary | ICD-10-CM | POA: Diagnosis not present

## 2014-07-22 DIAGNOSIS — E1140 Type 2 diabetes mellitus with diabetic neuropathy, unspecified: Secondary | ICD-10-CM | POA: Diagnosis not present

## 2014-07-22 DIAGNOSIS — S31109A Unspecified open wound of abdominal wall, unspecified quadrant without penetration into peritoneal cavity, initial encounter: Secondary | ICD-10-CM | POA: Diagnosis not present

## 2014-07-22 DIAGNOSIS — E11622 Type 2 diabetes mellitus with other skin ulcer: Secondary | ICD-10-CM | POA: Diagnosis not present

## 2014-07-22 DIAGNOSIS — L98499 Non-pressure chronic ulcer of skin of other sites with unspecified severity: Secondary | ICD-10-CM | POA: Diagnosis not present

## 2014-07-22 DIAGNOSIS — I872 Venous insufficiency (chronic) (peripheral): Secondary | ICD-10-CM | POA: Diagnosis not present

## 2014-07-22 DIAGNOSIS — L98493 Non-pressure chronic ulcer of skin of other sites with necrosis of muscle: Secondary | ICD-10-CM | POA: Diagnosis not present

## 2014-07-22 DIAGNOSIS — N186 End stage renal disease: Secondary | ICD-10-CM | POA: Diagnosis not present

## 2014-07-23 DIAGNOSIS — D509 Iron deficiency anemia, unspecified: Secondary | ICD-10-CM | POA: Diagnosis not present

## 2014-07-23 DIAGNOSIS — D631 Anemia in chronic kidney disease: Secondary | ICD-10-CM | POA: Diagnosis not present

## 2014-07-23 DIAGNOSIS — N186 End stage renal disease: Secondary | ICD-10-CM | POA: Diagnosis not present

## 2014-07-24 DIAGNOSIS — Z89512 Acquired absence of left leg below knee: Secondary | ICD-10-CM | POA: Diagnosis not present

## 2014-07-24 DIAGNOSIS — Z48816 Encounter for surgical aftercare following surgery on the genitourinary system: Secondary | ICD-10-CM | POA: Diagnosis not present

## 2014-07-24 DIAGNOSIS — Z992 Dependence on renal dialysis: Secondary | ICD-10-CM | POA: Diagnosis not present

## 2014-07-24 DIAGNOSIS — E119 Type 2 diabetes mellitus without complications: Secondary | ICD-10-CM | POA: Diagnosis not present

## 2014-07-24 DIAGNOSIS — I120 Hypertensive chronic kidney disease with stage 5 chronic kidney disease or end stage renal disease: Secondary | ICD-10-CM | POA: Diagnosis not present

## 2014-07-24 DIAGNOSIS — N186 End stage renal disease: Secondary | ICD-10-CM | POA: Diagnosis not present

## 2014-07-25 DIAGNOSIS — N186 End stage renal disease: Secondary | ICD-10-CM | POA: Diagnosis not present

## 2014-07-25 DIAGNOSIS — D509 Iron deficiency anemia, unspecified: Secondary | ICD-10-CM | POA: Diagnosis not present

## 2014-07-25 DIAGNOSIS — D631 Anemia in chronic kidney disease: Secondary | ICD-10-CM | POA: Diagnosis not present

## 2014-07-27 DIAGNOSIS — Z48816 Encounter for surgical aftercare following surgery on the genitourinary system: Secondary | ICD-10-CM | POA: Diagnosis not present

## 2014-07-27 DIAGNOSIS — Z89512 Acquired absence of left leg below knee: Secondary | ICD-10-CM | POA: Diagnosis not present

## 2014-07-27 DIAGNOSIS — E119 Type 2 diabetes mellitus without complications: Secondary | ICD-10-CM | POA: Diagnosis not present

## 2014-07-27 DIAGNOSIS — I120 Hypertensive chronic kidney disease with stage 5 chronic kidney disease or end stage renal disease: Secondary | ICD-10-CM | POA: Diagnosis not present

## 2014-07-27 DIAGNOSIS — Z992 Dependence on renal dialysis: Secondary | ICD-10-CM | POA: Diagnosis not present

## 2014-07-27 DIAGNOSIS — N186 End stage renal disease: Secondary | ICD-10-CM | POA: Diagnosis not present

## 2014-07-28 DIAGNOSIS — N186 End stage renal disease: Secondary | ICD-10-CM | POA: Diagnosis not present

## 2014-07-28 DIAGNOSIS — D509 Iron deficiency anemia, unspecified: Secondary | ICD-10-CM | POA: Diagnosis not present

## 2014-07-28 DIAGNOSIS — D631 Anemia in chronic kidney disease: Secondary | ICD-10-CM | POA: Diagnosis not present

## 2014-07-29 ENCOUNTER — Telehealth: Payer: Self-pay | Admitting: *Deleted

## 2014-07-29 DIAGNOSIS — I10 Essential (primary) hypertension: Secondary | ICD-10-CM | POA: Diagnosis not present

## 2014-07-29 DIAGNOSIS — D649 Anemia, unspecified: Secondary | ICD-10-CM | POA: Diagnosis not present

## 2014-07-29 DIAGNOSIS — N186 End stage renal disease: Secondary | ICD-10-CM | POA: Diagnosis not present

## 2014-07-29 NOTE — Telephone Encounter (Signed)
Home health certification and plan of care received via fax from [place]. Forwarded to [place]. JG//CMA

## 2014-07-30 DIAGNOSIS — D631 Anemia in chronic kidney disease: Secondary | ICD-10-CM | POA: Diagnosis not present

## 2014-07-30 DIAGNOSIS — Z48816 Encounter for surgical aftercare following surgery on the genitourinary system: Secondary | ICD-10-CM | POA: Diagnosis not present

## 2014-07-30 DIAGNOSIS — E1165 Type 2 diabetes mellitus with hyperglycemia: Secondary | ICD-10-CM | POA: Diagnosis not present

## 2014-07-30 DIAGNOSIS — N186 End stage renal disease: Secondary | ICD-10-CM | POA: Diagnosis not present

## 2014-07-30 DIAGNOSIS — E119 Type 2 diabetes mellitus without complications: Secondary | ICD-10-CM | POA: Diagnosis not present

## 2014-07-30 DIAGNOSIS — Z89512 Acquired absence of left leg below knee: Secondary | ICD-10-CM | POA: Diagnosis not present

## 2014-07-30 DIAGNOSIS — I120 Hypertensive chronic kidney disease with stage 5 chronic kidney disease or end stage renal disease: Secondary | ICD-10-CM | POA: Diagnosis not present

## 2014-07-30 DIAGNOSIS — K769 Liver disease, unspecified: Secondary | ICD-10-CM | POA: Diagnosis not present

## 2014-07-30 DIAGNOSIS — E8779 Other fluid overload: Secondary | ICD-10-CM | POA: Diagnosis not present

## 2014-07-30 DIAGNOSIS — Z992 Dependence on renal dialysis: Secondary | ICD-10-CM | POA: Diagnosis not present

## 2014-07-31 ENCOUNTER — Telehealth: Payer: Self-pay | Admitting: Physician Assistant

## 2014-07-31 DIAGNOSIS — Z89512 Acquired absence of left leg below knee: Secondary | ICD-10-CM | POA: Diagnosis not present

## 2014-07-31 DIAGNOSIS — E119 Type 2 diabetes mellitus without complications: Secondary | ICD-10-CM | POA: Diagnosis not present

## 2014-07-31 DIAGNOSIS — N186 End stage renal disease: Secondary | ICD-10-CM | POA: Diagnosis not present

## 2014-07-31 DIAGNOSIS — Z48816 Encounter for surgical aftercare following surgery on the genitourinary system: Secondary | ICD-10-CM | POA: Diagnosis not present

## 2014-07-31 DIAGNOSIS — I120 Hypertensive chronic kidney disease with stage 5 chronic kidney disease or end stage renal disease: Secondary | ICD-10-CM | POA: Diagnosis not present

## 2014-07-31 DIAGNOSIS — Z992 Dependence on renal dialysis: Secondary | ICD-10-CM | POA: Diagnosis not present

## 2014-07-31 NOTE — Telephone Encounter (Signed)
Please advise.//AB/CMA 

## 2014-07-31 NOTE — Telephone Encounter (Signed)
Please call to triage patient.  

## 2014-07-31 NOTE — Telephone Encounter (Signed)
If not resolving he will need an appointment.

## 2014-07-31 NOTE — Telephone Encounter (Signed)
Caller name: ben rothrock from [facility]  Relation to pt: Call back number: [phone] Pharmacy:  Reason for call:   Fell asleep in chair and now has back pain. Cannot function well. Protocol puts him in yellow zone.   Call patient.

## 2014-07-31 NOTE — Telephone Encounter (Signed)
Notified patient and [facility] Nurse.

## 2014-07-31 NOTE — Telephone Encounter (Signed)
Spoke with [place] RN who states that patient fell asleep in his wheelchair Thursday night.  When he woke up, he was having back pain.  Patient has been training to use quad cane and has now reverted back to a sliding board method to transfer from bed-wheelchair.  No shortness of breath, dizziness.  Patient reports that pain in back is keeping from progressing activity.

## 2014-08-01 DIAGNOSIS — N186 End stage renal disease: Secondary | ICD-10-CM | POA: Diagnosis not present

## 2014-08-01 DIAGNOSIS — E8779 Other fluid overload: Secondary | ICD-10-CM | POA: Diagnosis not present

## 2014-08-01 DIAGNOSIS — D631 Anemia in chronic kidney disease: Secondary | ICD-10-CM | POA: Diagnosis not present

## 2014-08-03 ENCOUNTER — Ambulatory Visit: Payer: BC Managed Care – PPO | Admitting: Family Medicine

## 2014-08-03 ENCOUNTER — Emergency Department (HOSPITAL_BASED_OUTPATIENT_CLINIC_OR_DEPARTMENT_OTHER)
Admission: EM | Admit: 2014-08-03 | Discharge: 2014-08-03 | Disposition: A | Discharge status: Home / Self Care | Payer: Medicare Other | Attending: Emergency Medicine | Admitting: Emergency Medicine

## 2014-08-03 ENCOUNTER — Encounter (HOSPITAL_BASED_OUTPATIENT_CLINIC_OR_DEPARTMENT_OTHER): Payer: Self-pay | Admitting: *Deleted

## 2014-08-03 ENCOUNTER — Emergency Department (HOSPITAL_BASED_OUTPATIENT_CLINIC_OR_DEPARTMENT_OTHER): Payer: Medicare Other

## 2014-08-03 DIAGNOSIS — N390 Urinary tract infection, site not specified: Secondary | ICD-10-CM | POA: Insufficient documentation

## 2014-08-03 DIAGNOSIS — Z86718 Personal history of other venous thrombosis and embolism: Secondary | ICD-10-CM | POA: Diagnosis not present

## 2014-08-03 DIAGNOSIS — Z96642 Presence of left artificial hip joint: Secondary | ICD-10-CM | POA: Diagnosis not present

## 2014-08-03 DIAGNOSIS — Z9981 Dependence on supplemental oxygen: Secondary | ICD-10-CM | POA: Diagnosis not present

## 2014-08-03 DIAGNOSIS — Z8659 Personal history of other mental and behavioral disorders: Secondary | ICD-10-CM | POA: Diagnosis not present

## 2014-08-03 DIAGNOSIS — E039 Hypothyroidism, unspecified: Secondary | ICD-10-CM | POA: Insufficient documentation

## 2014-08-03 DIAGNOSIS — Z8709 Personal history of other diseases of the respiratory system: Secondary | ICD-10-CM | POA: Insufficient documentation

## 2014-08-03 DIAGNOSIS — Z872 Personal history of diseases of the skin and subcutaneous tissue: Secondary | ICD-10-CM | POA: Insufficient documentation

## 2014-08-03 DIAGNOSIS — Z794 Long term (current) use of insulin: Secondary | ICD-10-CM | POA: Diagnosis not present

## 2014-08-03 DIAGNOSIS — G4730 Sleep apnea, unspecified: Secondary | ICD-10-CM | POA: Diagnosis not present

## 2014-08-03 DIAGNOSIS — N183 Chronic kidney disease, stage 3 (moderate): Secondary | ICD-10-CM | POA: Insufficient documentation

## 2014-08-03 DIAGNOSIS — Z79899 Other long term (current) drug therapy: Secondary | ICD-10-CM | POA: Diagnosis not present

## 2014-08-03 DIAGNOSIS — Z8619 Personal history of other infectious and parasitic diseases: Secondary | ICD-10-CM | POA: Diagnosis not present

## 2014-08-03 DIAGNOSIS — M5432 Sciatica, left side: Secondary | ICD-10-CM | POA: Insufficient documentation

## 2014-08-03 DIAGNOSIS — Z87448 Personal history of other diseases of urinary system: Secondary | ICD-10-CM | POA: Insufficient documentation

## 2014-08-03 DIAGNOSIS — M25552 Pain in left hip: Secondary | ICD-10-CM | POA: Diagnosis not present

## 2014-08-03 DIAGNOSIS — G8929 Other chronic pain: Secondary | ICD-10-CM | POA: Diagnosis not present

## 2014-08-03 DIAGNOSIS — D649 Anemia, unspecified: Secondary | ICD-10-CM | POA: Diagnosis not present

## 2014-08-03 DIAGNOSIS — Z471 Aftercare following joint replacement surgery: Secondary | ICD-10-CM | POA: Diagnosis not present

## 2014-08-03 DIAGNOSIS — E785 Hyperlipidemia, unspecified: Secondary | ICD-10-CM | POA: Diagnosis not present

## 2014-08-03 DIAGNOSIS — Z8701 Personal history of pneumonia (recurrent): Secondary | ICD-10-CM | POA: Diagnosis not present

## 2014-08-03 DIAGNOSIS — I129 Hypertensive chronic kidney disease with stage 1 through stage 4 chronic kidney disease, or unspecified chronic kidney disease: Secondary | ICD-10-CM | POA: Insufficient documentation

## 2014-08-03 DIAGNOSIS — Z8719 Personal history of other diseases of the digestive system: Secondary | ICD-10-CM | POA: Insufficient documentation

## 2014-08-03 DIAGNOSIS — Z87442 Personal history of urinary calculi: Secondary | ICD-10-CM | POA: Diagnosis not present

## 2014-08-03 DIAGNOSIS — E119 Type 2 diabetes mellitus without complications: Secondary | ICD-10-CM | POA: Diagnosis not present

## 2014-08-03 DIAGNOSIS — M549 Dorsalgia, unspecified: Secondary | ICD-10-CM | POA: Diagnosis present

## 2014-08-03 DIAGNOSIS — M47816 Spondylosis without myelopathy or radiculopathy, lumbar region: Secondary | ICD-10-CM | POA: Diagnosis not present

## 2014-08-03 LAB — URINE MICROSCOPIC-ADD ON

## 2014-08-03 LAB — URINALYSIS, ROUTINE W REFLEX MICROSCOPIC
Bilirubin Urine: NEGATIVE
Glucose, UA: NEGATIVE mg/dL
Ketones, ur: NEGATIVE mg/dL
Nitrite: NEGATIVE
PH: 8 (ref 5.0–8.0)
Protein, ur: 100 mg/dL — AB
Specific Gravity, Urine: 1.015 (ref 1.005–1.030)
Urobilinogen, UA: 0.2 mg/dL (ref 0.0–1.0)

## 2014-08-03 MED ORDER — SULFAMETHOXAZOLE-TRIMETHOPRIM 800-160 MG PO TABS: 1.0000 | ORAL_TABLET | Freq: Every day | ORAL | Status: DC

## 2014-08-03 MED ORDER — HYDROMORPHONE HCL 1 MG/ML IJ SOLN
1.0000 mg | Freq: Once | INTRAMUSCULAR | Status: AC
  Administered 2014-08-03: 1 mg via INTRAMUSCULAR
  Filled 2014-08-03: qty 1

## 2014-08-03 MED ORDER — HYDROMORPHONE HCL 1 MG/ML IJ SOLN
2.0000 mg | Freq: Once | INTRAMUSCULAR | Status: AC
  Administered 2014-08-03: 2 mg via INTRAMUSCULAR
  Filled 2014-08-03: qty 2

## 2014-08-03 MED ORDER — OXYCODONE HCL 5 MG PO TABS: 5.0000 mg | ORAL_TABLET | Freq: Four times a day (QID) | ORAL | Status: DC | PRN

## 2014-08-03 NOTE — ED Notes (Signed)
Back pain. Hip revision. States pain feels like his hip is the cause.

## 2014-08-03 NOTE — Telephone Encounter (Signed)
Faxed to [facility] on 07/31/14

## 2014-08-03 NOTE — Discharge Instructions (Signed)
Urinary Tract Infection A urinary tract infection (UTI) can occur any place along the urinary tract. The tract includes the kidneys, ureters, bladder, and urethra. A type of germ called bacteria often causes a UTI. UTIs are often helped with antibiotic medicine.  HOME CARE   If given, take antibiotics as told by your doctor. Finish them even if you start to feel better.  Drink enough fluids to keep your pee (urine) clear or pale yellow.  Avoid tea, drinks with caffeine, and bubbly (carbonated) drinks.  Pee often. Avoid holding your pee in for a long time.  Pee before and after having sex (intercourse).  Wipe from front to back after you poop (bowel movement) if you are a woman. Use each tissue only once. GET HELP RIGHT AWAY IF:   You have back pain.  You have lower belly (abdominal) pain.  You have chills.  You feel sick to your stomach (nauseous).  You throw up (vomit).  Your burning or discomfort with peeing does not go away.  You have a fever.  Your symptoms are not better in 3 days. MAKE SURE YOU:   Understand these instructions.  Will watch your condition.  Will get help right away if you are not doing well or get worse. Document Released: 08/02/2007 Document Revised: 11/08/2011 Document Reviewed: 09/14/2011 [facility] Patient Information 2015 [place], [place]. This information is not intended to replace advice given to you by your health care provider. Make sure you discuss any questions you have with your health care provider. Sciatica Sciatica is pain, weakness, numbness, or tingling along the path of the sciatic nerve. The nerve starts in the lower back and runs down the back of each leg. The nerve controls the muscles in the lower leg and in the back of the knee, while also providing sensation to the back of the thigh, lower leg, and the sole of your foot. Sciatica is a symptom of another medical condition. For instance, nerve damage or certain conditions, such as a  herniated disk or bone spur on the spine, pinch or put pressure on the sciatic nerve. This causes the pain, weakness, or other sensations normally associated with sciatica. Generally, sciatica only affects one side of the body. CAUSES   Herniated or slipped disc.  Degenerative disk disease.  A pain disorder involving the narrow muscle in the buttocks (piriformis syndrome).  Pelvic injury or fracture.  Pregnancy.  Tumor (rare). SYMPTOMS  Symptoms can vary from mild to very severe. The symptoms usually travel from the low back to the buttocks and down the back of the leg. Symptoms can include:  Mild tingling or dull aches in the lower back, leg, or hip.  Numbness in the back of the calf or sole of the foot.  Burning sensations in the lower back, leg, or hip.  Sharp pains in the lower back, leg, or hip.  Leg weakness.  Severe back pain inhibiting movement. These symptoms may get worse with coughing, sneezing, laughing, or prolonged sitting or standing. Also, being overweight may worsen symptoms. DIAGNOSIS  Your caregiver will perform a physical exam to look for common symptoms of sciatica. He or she may ask you to do certain movements or activities that would trigger sciatic nerve pain. Other tests may be performed to find the cause of the sciatica. These may include:  Blood tests.  X-rays.  Imaging tests, such as an MRI or CT scan. TREATMENT  Treatment is directed at the cause of the sciatic pain. Sometimes, treatment is  not necessary and the pain and discomfort goes away on its own. If treatment is needed, your caregiver may suggest:  Over-the-counter medicines to relieve pain.  Prescription medicines, such as anti-inflammatory medicine, muscle relaxants, or narcotics.  Applying heat or ice to the painful area.  Steroid injections to lessen pain, irritation, and inflammation around the nerve.  Reducing activity during periods of pain.  Exercising and stretching to  strengthen your abdomen and improve flexibility of your spine. Your caregiver may suggest losing weight if the extra weight makes the back pain worse.  Physical therapy.  Surgery to eliminate what is pressing or pinching the nerve, such as a bone spur or part of a herniated disk. HOME CARE INSTRUCTIONS   Only take over-the-counter or prescription medicines for pain or discomfort as directed by your caregiver.  Apply ice to the affected area for 20 minutes, 3-4 times a day for the first 48-72 hours. Then try heat in the same way.  Exercise, stretch, or perform your usual activities if these do not aggravate your pain.  Attend physical therapy sessions as directed by your caregiver.  Keep all follow-up appointments as directed by your caregiver.  Do not wear high heels or shoes that do not provide proper support.  Check your mattress to see if it is too soft. A firm mattress may lessen your pain and discomfort. SEEK IMMEDIATE MEDICAL CARE IF:   You lose control of your bowel or bladder (incontinence).  You have increasing weakness in the lower back, pelvis, buttocks, or legs.  You have redness or swelling of your back.  You have a burning sensation when you urinate.  You have pain that gets worse when you lie down or awakens you at night.  Your pain is worse than you have experienced in the past.  Your pain is lasting longer than 4 weeks.  You are suddenly losing weight without reason. MAKE SURE YOU:  Understand these instructions.  Will watch your condition.  Will get help right away if you are not doing well or get worse. Document Released: 02/07/2001 Document Revised: 08/15/2011 Document Reviewed: 06/25/2011 [facility] Patient Information 2015 [place], [place]. This information is not intended to replace advice given to you by your health care provider. Make sure you discuss any questions you have with your health care provider.

## 2014-08-03 NOTE — ED Notes (Signed)
Pt in xr, will medicate when he returns.

## 2014-08-04 DIAGNOSIS — N186 End stage renal disease: Secondary | ICD-10-CM | POA: Diagnosis not present

## 2014-08-04 DIAGNOSIS — E8779 Other fluid overload: Secondary | ICD-10-CM | POA: Diagnosis not present

## 2014-08-04 DIAGNOSIS — D631 Anemia in chronic kidney disease: Secondary | ICD-10-CM | POA: Diagnosis not present

## 2014-08-04 NOTE — ED Provider Notes (Signed)
CSN: [id number]     Arrival date & time 08/03/14  1145 History   First MD Initiated Contact with Patient 08/03/14 1219     Chief Complaint  Patient presents with  . Back Pain     (Consider location/radiation/quality/duration/timing/severity/associated sxs/prior Treatment) HPI Comments: Patient reports left lower back pain and left hip pain that feels similar to when he had a left hip fracture through his prosthetic about one year ago.  He denies fevers, chills, decreased urine output into his urostomy numbness or weakness.  Pain is constant, better when standing, worse when lying down and with certain movements.    Past Medical History  Diagnosis Date  . Sleep apnea     uses cpap  . Hypothyroidism   . Anemia   . Blood transfusion   . Chronic kidney disease     hx of kidney stones  . Hypertension   . Arthritis     osteoarthritis  . Pneumonia   . Diabetes mellitus     insulin dependent  . Peripheral vascular disease   . Hyperlipidemia   . Morbid obesity   . Hypertrophy of prostate   . Acute respiratory failure   . Cellulitis and abscess of leg   . Chronic kidney disease, stage III (moderate)   . Nephrolithiasis   . Anxiety   . Chronic pain   . Embolism and thrombosis of unspecified artery   . DVT (deep venous thrombosis)     Lower Extremity  . Personal history of arthritis     Osteoarthritis  . History of nephrolithiasis     Bilateral  . AR (allergic rhinitis)   . BPH (benign prostatic hypertrophy)   . Displacement of lumbar intervertebral disc without myelopathy     L4-L5  . Cholelithiasis   . Other lymphedema   . Unspecified septicemia   . Impotence of organic origin   . Pancreatitis, acute   . Diverticulosis of colon   . Gallstone    Past Surgical History  Procedure Laterality Date  . Bilteral hip      replacement & revison  's   . Knee surgery      left knee    . Tonsillectomy    . Amputation  02/07/2011    Procedure: AMPUTATION BELOW KNEE;  Surgeon:  Newt Minion, MD;  Location: [facility];  Service: Orthopedics;  Laterality: Left;  Left Below Knee Amputation  . Cystoscopy w/ retrogrades Bilateral 10/24/2012    Procedure: CYSTOSCOPY WITH RETROGRADE PYELOGRAM Procedure: Cystoscopy, Bilateral Retrogarde Pyelograms, Bladder Biopsy, Hydrodistension;  Surgeon: Franchot Gallo, MD;  Location: [facility];  Service: Urology;  Laterality: Bilateral;  . Leg amputation below knee    . Kidney surgery    . Replacement total knee     Family History  Problem Relation Age of Onset  . Diabetes Mother   . Heart attack Mother   . Stroke Mother   . Colon cancer Father   . Dementia Father   . Heart attack Father   . Arthritis Paternal Grandmother   . Hypertension Other   . Hypothyroidism Other    History  Substance Use Topics  . Smoking status: Never Smoker   . Smokeless tobacco: Never Used  . Alcohol Use: No    Review of Systems  Constitutional: Negative for fever, activity change, appetite change and fatigue.  HENT: Negative for congestion, facial swelling, rhinorrhea and trouble swallowing.   Eyes: Negative for photophobia and pain.  Respiratory: Negative for cough, chest tightness  and shortness of breath.   Cardiovascular: Negative for chest pain and leg swelling.  Gastrointestinal: Negative for nausea, vomiting, abdominal pain, diarrhea and constipation.  Endocrine: Negative for polydipsia and polyuria.  Genitourinary: Negative for dysuria, urgency, decreased urine volume and difficulty urinating.  Musculoskeletal: Negative for back pain and gait problem.  Skin: Negative for color change, rash and wound.  Allergic/Immunologic: Negative for immunocompromised state.  Neurological: Negative for dizziness, facial asymmetry, speech difficulty, weakness, numbness and headaches.  Psychiatric/Behavioral: Negative for confusion, decreased concentration and agitation.      Allergies  Review of patient's allergies indicates no known allergies.  Home  Medications   Prior to Admission medications   Medication Sig Start Date End Date Taking? Authorizing Provider  atorvastatin (LIPITOR) 40 MG tablet TAKE 1 TABLET EVERY DAY 06/25/14   Brunetta Jeans, PA-C  B-D INS SYRINGE 0.5CC/31GX5/16 31G X 5/16" 0.5 ML MISC USE 4 TIMES A DAY FOR DIABETES 05/25/14   Brunetta Jeans, PA-C  ferrous sulfate 325 (65 FE) MG tablet Take 325 mg by mouth 2 (two) times daily.     Historical Provider, MD  glucose blood (BAYER CONTOUR TEST) test strip USE AS DIRECTED TO CHECK BLOOD SUGARS TWICE DAILY Dx: E11.9 06/12/14   Brunetta Jeans, PA-C  insulin glargine (LANTUS) 100 UNIT/ML injection Inject 0.4 mLs (40 Units total) into the skin at bedtime. 06/12/14   Brunetta Jeans, PA-C  levothyroxine (SYNTHROID, LEVOTHROID) 200 MCG tablet TAKE 1 TABLET (200 MCG TOTAL) BY MOUTH EVERY MORNING. 06/18/14   Brunetta Jeans, PA-C  levothyroxine (SYNTHROID, LEVOTHROID) 25 MCG tablet TAKE 1 TABLET (25 MCG TOTAL) BY MOUTH DAILY BEFORE BREAKFAST. 07/20/14   Brunetta Jeans, PA-C  Multiple Vitamins-Minerals (MULTIVITAMINS THER. W/MINERALS) TABS Take 1 tablet by mouth daily.     Historical Provider, MD  NOVOLOG 100 UNIT/ML injection INJECT 3 UNITS THREE TIMES DAILY WITH MEALS. AND AT NIGHTIME IF BLOOD SUGARS GREATER THAN 150 04/08/14   Debbrah Alar, NP  oxyCODONE (OXY IR/ROXICODONE) 5 MG immediate release tablet Take 1 tablet (5 mg total) by mouth every 6 (six) hours as needed for severe pain. 08/03/14   Ernestina Patches, MD  sevelamer carbonate (RENVELA) 800 MG tablet Take 800 mg by mouth 3 (three) times daily with meals.    Historical Provider, MD  sulfamethoxazole-trimethoprim (BACTRIM DS,SEPTRA DS) 800-160 MG per tablet Take 1 tablet by mouth daily. For 7 days. Take after dialysis on dialysis days. 08/03/14 08/10/14  Ernestina Patches, MD   BP 108/62 mmHg  Pulse 82  Temp(Src) 98.6 F (37 C) (Oral)  Resp 18  Ht 6' (1.829 m)  Wt 250 lb (113.399 kg)  BMI 33.90 kg/m2  SpO2 95% Physical  Exam  Constitutional: He is oriented to person, place, and time. He appears well-developed and well-nourished. No distress.  HENT:  Head: Normocephalic and atraumatic.  Mouth/Throat: No oropharyngeal exudate.  Eyes: Pupils are equal, round, and reactive to light.  Neck: Normal range of motion. Neck supple.  Cardiovascular: Normal rate, regular rhythm and normal heart sounds.  Exam reveals no gallop and no friction rub.   No murmur heard. Pulmonary/Chest: Effort normal and breath sounds normal. No respiratory distress. He has no wheezes. He has no rales.  Abdominal: Soft. Bowel sounds are normal. He exhibits no distension and no mass. There is no tenderness. There is no rebound and no guarding.  Musculoskeletal: Normal range of motion. He exhibits no edema or tenderness.       Back:  Legs: Neurological: He is alert and oriented to person, place, and time.  Skin: Skin is warm and dry.  Psychiatric: He has a normal mood and affect.    ED Course  Procedures (including critical care time) Labs Review Labs Reviewed  URINALYSIS, ROUTINE W REFLEX MICROSCOPIC (NOT AT [facility]) - Abnormal; Notable for the following:    APPearance TURBID (*)    Hgb urine dipstick MODERATE (*)    Protein, ur 100 (*)    Leukocytes, UA LARGE (*)    All other components within normal limits  URINE MICROSCOPIC-ADD ON - Abnormal; Notable for the following:    Bacteria, UA MANY (*)    All other components within normal limits  URINE CULTURE    Imaging Review Dg Lumbar Spine Complete  08/03/2014   CLINICAL DATA:  Lumbago.  EXAM: LUMBAR SPINE - COMPLETE 4+ VIEW  COMPARISON:  None.  FINDINGS: No acute fracture or subluxation is identified. Diffuse spondylosis present involving the entire lumbar spine. All levels demonstrate osteophyte formation and facet hypertrophy without significant disc space narrowing. No bony lesions or destruction identified.  IMPRESSION: Diffuse lumbar spondylosis without evidence of  fracture or subluxation.   Electronically Signed   By: Aletta Edouard M.D.   On: 08/03/2014 13:22   Dg Hip Unilat With Pelvis 2-3 Views Left  08/03/2014   CLINICAL DATA:  Left hip pain  EXAM: LEFT HIP (WITH PELVIS) 2-3 VIEWS  COMPARISON:  None.  FINDINGS: Bilateral total hip arthroplasty. The evaluated left hip is located and well-seated. The pelvic ring is intact.  Marked osteopenia, especially for age.  Diffuse arterial calcification.  IMPRESSION: 1. No acute findings. 2. Bilateral total hip arthroplasty.   Electronically Signed   By: Monte Fantasia M.D.   On: 08/03/2014 13:19     EKG Interpretation None      MDM   Final diagnoses:  Left hip pain  Sciatica, left  UTI (lower urinary tract infection)    Pt is a 59 y.o. male with Pmhx as above who presents with L hip/ left low back pain with radiation down posterior L leg. No bowel or bladder incontinence, fevers, recent falls. On PE, pt has pain w/ ROM of L hip, over L lower back, not over spinous processes. XR L hip & lumbar spine w/o acute findings.  Urine appears infected. I spoke w/ pharmacy. Will start one DS bactrim per day for 1 week, to be taken after HD on dialysis scheduled days. I believe symptoms c/w sciatica, doubt cauda equina, cord compression or epidural abscess.    Gareth Eagle evaluation in the Emergency Department is complete. It has been determined that no acute conditions requiring further emergency intervention are present at this time. The patient/guardian have been advised of the diagnosis and plan. We have discussed signs and symptoms that warrant return to the ED, such as changes or worsening in symptoms, worsening pain, fever.      Ernestina Patches, MD 08/04/14 [phone]

## 2014-08-05 DIAGNOSIS — L98493 Non-pressure chronic ulcer of skin of other sites with necrosis of muscle: Secondary | ICD-10-CM | POA: Diagnosis not present

## 2014-08-06 ENCOUNTER — Telehealth: Payer: Self-pay | Admitting: Physician Assistant

## 2014-08-06 DIAGNOSIS — E8779 Other fluid overload: Secondary | ICD-10-CM | POA: Diagnosis not present

## 2014-08-06 DIAGNOSIS — N186 End stage renal disease: Secondary | ICD-10-CM | POA: Diagnosis not present

## 2014-08-06 DIAGNOSIS — D631 Anemia in chronic kidney disease: Secondary | ICD-10-CM | POA: Diagnosis not present

## 2014-08-06 LAB — URINE CULTURE: Colony Count: >=100000

## 2014-08-06 NOTE — Telephone Encounter (Signed)
I signed something earlier in the week

## 2014-08-06 NOTE — Telephone Encounter (Signed)
Caller name: Willette Cluster with [facility]  Can be reached: [phone] Fax: [phone]  Reason for call: [facility] plan of care was faxed 07/30/14 to [phone]. Please have signed by Dr. Charlett Blake or [name] with cosign of Dr. Charlett Blake. Please have returned asap. Asked her to refax to [phone].

## 2014-08-06 NOTE — Progress Notes (Signed)
ED Antimicrobial Stewardship Positive Culture Follow Up   [name] is an 59 y.o. male who presented to [facility] on 08/03/2014 with a chief complaint of  Chief Complaint  Patient presents with  . Back Pain    Recent Results (from the past 720 hour(s))  Urine culture     Status: None   Collection Time: 08/03/14 12:25 PM  Result Value Ref Range Status   Specimen Description URINE, CLEAN CATCH  Bag ped  Final   Special Requests Immunocompromised  Final   Colony Count   Final    >=100,000 COLONIES/ML Performed at [facility]    Culture   Final    ESCHERICHIA COLI Performed at [facility]    Report Status 08/06/2014 FINAL  Final   Organism ID, Bacteria ESCHERICHIA COLI  Final      Susceptibility   Escherichia coli - MIC*    AMPICILLIN >=32 RESISTANT Resistant     CEFAZOLIN 8 SENSITIVE Sensitive     CEFTRIAXONE <=1 SENSITIVE Sensitive     CIPROFLOXACIN >=4 RESISTANT Resistant     GENTAMICIN >=16 RESISTANT Resistant     LEVOFLOXACIN >=8 RESISTANT Resistant     NITROFURANTOIN <=16 SENSITIVE Sensitive     TOBRAMYCIN 8 INTERMEDIATE Intermediate     TRIMETH/SULFA >=320 RESISTANT Resistant     PIP/TAZO <=4 SENSITIVE Sensitive     * ESCHERICHIA COLI    [x]  Treated with Bactrim, organism resistant to prescribed antimicrobial  [name] with ESRD and urostomy who presented on 6/7 with back pain. Noted decreased urine output. The patient was noted to be afebrile - urine culture could represent colonization and not a true pathogen. Discussed with PA-C and plans are to hold treatment at this time.   New antibiotic prescription: No treatment needed. Follow-up with urology  ED Provider: Waynetta Pean, PA-C  Lawson Radar 08/06/2014, 9:38 AM Infectious Diseases Pharmacist Phone# [phone]

## 2014-08-06 NOTE — Telephone Encounter (Signed)
I placed in Dr. Frederik Pear in-basket earlier in the week and she is noting it was signed.   Jess, since you handle paperwork, could you see if you can locate this?

## 2014-08-07 ENCOUNTER — Telehealth: Payer: Self-pay | Admitting: *Deleted

## 2014-08-07 DIAGNOSIS — E119 Type 2 diabetes mellitus without complications: Secondary | ICD-10-CM | POA: Diagnosis not present

## 2014-08-07 DIAGNOSIS — I120 Hypertensive chronic kidney disease with stage 5 chronic kidney disease or end stage renal disease: Secondary | ICD-10-CM | POA: Diagnosis not present

## 2014-08-07 DIAGNOSIS — Z48816 Encounter for surgical aftercare following surgery on the genitourinary system: Secondary | ICD-10-CM | POA: Diagnosis not present

## 2014-08-07 DIAGNOSIS — Z992 Dependence on renal dialysis: Secondary | ICD-10-CM | POA: Diagnosis not present

## 2014-08-07 DIAGNOSIS — Z89512 Acquired absence of left leg below knee: Secondary | ICD-10-CM | POA: Diagnosis not present

## 2014-08-07 DIAGNOSIS — N186 End stage renal disease: Secondary | ICD-10-CM | POA: Diagnosis not present

## 2014-08-07 NOTE — Telephone Encounter (Signed)
Form faxed to [facility] successfully. JG//CMA

## 2014-08-07 NOTE — ED Notes (Signed)
(+)  urine culture, treated with Sulfamethoxazole, F/U with PCP or urology per Mariea Clonts, PA

## 2014-08-07 NOTE — Telephone Encounter (Signed)
Has been signed and co-signed.  Please fax.

## 2014-08-07 NOTE — Telephone Encounter (Signed)
Signed form was faxed earlier this week or last week, but for some reason, [facility] is requiring MD and PA signature.

## 2014-08-08 DIAGNOSIS — N186 End stage renal disease: Secondary | ICD-10-CM | POA: Diagnosis not present

## 2014-08-08 DIAGNOSIS — E8779 Other fluid overload: Secondary | ICD-10-CM | POA: Diagnosis not present

## 2014-08-08 DIAGNOSIS — D631 Anemia in chronic kidney disease: Secondary | ICD-10-CM | POA: Diagnosis not present

## 2014-08-10 ENCOUNTER — Ambulatory Visit (INDEPENDENT_AMBULATORY_CARE_PROVIDER_SITE_OTHER): Payer: Medicare Other | Admitting: Physician Assistant

## 2014-08-10 ENCOUNTER — Encounter: Payer: Self-pay | Admitting: Physician Assistant

## 2014-08-10 VITALS — BP 99/70 | HR 79 | Temp 97.9°F | Resp 16 | Ht 71.0 in | Wt 250.0 lb

## 2014-08-10 DIAGNOSIS — R399 Unspecified symptoms and signs involving the genitourinary system: Secondary | ICD-10-CM

## 2014-08-10 DIAGNOSIS — N3000 Acute cystitis without hematuria: Secondary | ICD-10-CM

## 2014-08-10 DIAGNOSIS — M5432 Sciatica, left side: Secondary | ICD-10-CM | POA: Diagnosis not present

## 2014-08-10 LAB — POCT URINALYSIS DIPSTICK
Bilirubin, UA: NEGATIVE
Glucose, UA: NEGATIVE
KETONES UA: NEGATIVE
NITRITE UA: NEGATIVE
PH UA: 7.5
Protein, UA: POSITIVE
RBC UA: POSITIVE
Spec Grav, UA: 1.025
Urobilinogen, UA: 1

## 2014-08-10 MED ORDER — INSULIN ASPART 100 UNIT/ML ~~LOC~~ SOLN: SUBCUTANEOUS | Status: DC

## 2014-08-10 MED ORDER — OXYCODONE HCL 5 MG PO TABS: 5.0000 mg | ORAL_TABLET | Freq: Four times a day (QID) | ORAL | Status: DC | PRN

## 2014-08-10 MED ORDER — "INSULIN SYRINGE-NEEDLE U-100 31G X 5/16"" 0.5 ML MISC": Status: DC

## 2014-08-10 MED ORDER — ATORVASTATIN CALCIUM 40 MG PO TABS: 40.0000 mg | ORAL_TABLET | Freq: Every day | ORAL | Status: DC

## 2014-08-10 MED ORDER — CIPROFLOXACIN HCL 250 MG PO TABS: ORAL_TABLET | ORAL | Status: DC

## 2014-08-10 MED ORDER — GLUCOSE BLOOD VI STRP: ORAL_STRIP | Status: DC

## 2014-08-10 MED ORDER — LEVOTHYROXINE SODIUM 200 MCG PO TABS: ORAL_TABLET | ORAL | Status: DC

## 2014-08-10 MED ORDER — FERROUS SULFATE 325 (65 FE) MG PO TABS: 325.0000 mg | ORAL_TABLET | Freq: Two times a day (BID) | ORAL | Status: DC

## 2014-08-10 MED ORDER — LEVOTHYROXINE SODIUM 25 MCG PO TABS: ORAL_TABLET | ORAL | Status: DC

## 2014-08-10 MED ORDER — METHYLPREDNISOLONE 4 MG PO TBPK: ORAL_TABLET | ORAL | Status: DC

## 2014-08-10 MED ORDER — INSULIN GLARGINE 100 UNIT/ML ~~LOC~~ SOLN: 40.0000 [IU] | Freq: Every day | SUBCUTANEOUS | Status: DC

## 2014-08-10 NOTE — Patient Instructions (Signed)
Please take steroid pack as directed. Continue pain medication as needed. Change your position every couple of hours when sitting or lying. Keep an eye on your blood sugar. Take your insulin following your previous instructions. If sugar > 350, please call us for further instructions. You should minimize carbs while on the steroid but always have a bedtime snack (apples are ok with your kidney function).  Start the new antibiotic as directed taking after dialysis.  Follow-up in 1 week.

## 2014-08-10 NOTE — Assessment & Plan Note (Signed)
[facility] and [facility] reports reviewed. Blood sugar has been well controlled over the past year. Rx Medrol dose pack. Pain medication refill. Supportive measures reviewed. Glycemic control measures discussed with patient. Handout given. Follow-up in one week.

## 2014-08-10 NOTE — Progress Notes (Signed)
Patient presents to clinic today for ER follow-up regarding left-sided sciatica and urinary tract infection. Patient was seen in the ER on 08/03/2014. X-rays of lumbar spine and left hip performed, without acute abnormality noted. Patient was given pain medication and instructed to follow-up with his PCP. Of note urine dipstick performed in ER. Positive for UTI. Patient was started on Bactrim until culture to be obtained.  In terms of UTI, patient endorses taking medication as directed. EMR reviewed revealing urine culture positive for UTI with Escherichia coli that is resistant anabiotic given. Patient states he was never informed of this by the ER. Patient currently on hemodialysis 3 times weekly due to end-stage renal disease.  Patient endorses taking pain medication as directed with minimal improvement to his low back pain. Pain is still radiating down the left lower extremity. Patient denies numbness or weakness. Denies trauma or injury.  Past Medical History  Diagnosis Date  . Sleep apnea     uses cpap  . Hypothyroidism   . Anemia   . Blood transfusion   . Chronic kidney disease     hx of kidney stones  . Hypertension   . Arthritis     osteoarthritis  . Pneumonia   . Diabetes mellitus     insulin dependent  . Peripheral vascular disease   . Hyperlipidemia   . Morbid obesity   . Hypertrophy of prostate   . Acute respiratory failure   . Cellulitis and abscess of leg   . Chronic kidney disease, stage III (moderate)   . Nephrolithiasis   . Anxiety   . Chronic pain   . Embolism and thrombosis of unspecified artery   . DVT (deep venous thrombosis)     Lower Extremity  . Personal history of arthritis     Osteoarthritis  . History of nephrolithiasis     Bilateral  . AR (allergic rhinitis)   . BPH (benign prostatic hypertrophy)   . Displacement of lumbar intervertebral disc without myelopathy     L4-L5  . Cholelithiasis   . Other lymphedema   . Unspecified septicemia   .  Impotence of organic origin   . Pancreatitis, acute   . Diverticulosis of colon   . Gallstone     Current Outpatient Prescriptions on File Prior to Visit  Medication Sig Dispense Refill  . atorvastatin (LIPITOR) 40 MG tablet TAKE 1 TABLET EVERY DAY 30 tablet 2  . B-D INS SYRINGE 0.5CC/31GX5/16 31G X 5/16" 0.5 ML MISC USE 4 TIMES A DAY FOR DIABETES 100 each 4  . ferrous sulfate 325 (65 FE) MG tablet Take 325 mg by mouth 2 (two) times daily.     Marland Kitchen glucose blood (BAYER CONTOUR TEST) test strip USE AS DIRECTED TO CHECK BLOOD SUGARS TWICE DAILY Dx: E11.9 100 each 3  . insulin glargine (LANTUS) 100 UNIT/ML injection Inject 0.4 mLs (40 Units total) into the skin at bedtime. 40 mL 5  . levothyroxine (SYNTHROID, LEVOTHROID) 200 MCG tablet TAKE 1 TABLET (200 MCG TOTAL) BY MOUTH EVERY MORNING. 30 tablet 5  . levothyroxine (SYNTHROID, LEVOTHROID) 25 MCG tablet TAKE 1 TABLET (25 MCG TOTAL) BY MOUTH DAILY BEFORE BREAKFAST. 30 tablet 5  . Multiple Vitamins-Minerals (MULTIVITAMINS THER. W/MINERALS) TABS Take 1 tablet by mouth daily.     Marland Kitchen NOVOLOG 100 UNIT/ML injection INJECT 3 UNITS THREE TIMES DAILY WITH MEALS. AND AT NIGHTIME IF BLOOD SUGARS GREATER THAN 150 20 mL 1  . sevelamer carbonate (RENVELA) 800 MG tablet Take 800  mg by mouth 3 (three) times daily with meals.     No current facility-administered medications on file prior to visit.    No Known Allergies  Family History  Problem Relation Age of Onset  . Diabetes Mother   . Heart attack Mother   . Stroke Mother   . Colon cancer Father   . Dementia Father   . Heart attack Father   . Arthritis Paternal Grandmother   . Hypertension Other   . Hypothyroidism Other     History   Social History  . Marital Status: Married    Spouse Name: N/A  . Number of Children: N/A  . Years of Education: N/A   Social History Main Topics  . Smoking status: Never Smoker   . Smokeless tobacco: Never Used  . Alcohol Use: No  . Drug Use: No  . Sexual  Activity: Yes   Other Topics Concern  . None   Social History Narrative   Lives with his wife and uses a wheelchair for transfers.      Review of Systems - See HPI.  All other ROS are negative.  BP 99/70 mmHg  Pulse 79  Temp(Src) 97.9 F (36.6 C) (Oral)  Resp 16  Ht 5\' 11"  (1.803 m)  Wt 250 lb (113.399 kg)  BMI 34.88 kg/m2  SpO2 98%  Physical Exam  Constitutional: He is oriented to person, place, and time and well-developed, well-nourished, and in no distress.  HENT:  Head: Normocephalic and atraumatic.  Eyes: Conjunctivae are normal.  Cardiovascular: Normal rate, regular rhythm, normal heart sounds and intact distal pulses.   Pulmonary/Chest: Effort normal and breath sounds normal. No respiratory distress. He has no wheezes. He has no rales. He exhibits no tenderness.  Musculoskeletal:       Lumbar back: He exhibits pain. He exhibits normal range of motion, no tenderness and no spasm.  Neurological: He is alert and oriented to person, place, and time.  Skin: Skin is warm and dry. No rash noted.  Psychiatric: Affect normal.  Vitals reviewed.   Recent Results (from the past 2160 hour(s))  Urinalysis, Routine w reflex microscopic (not at [facility])     Status: Abnormal   Collection Time: 08/03/14 12:25 PM  Result Value Ref Range   Color, Urine YELLOW YELLOW   APPearance TURBID (A) CLEAR   Specific Gravity, Urine 1.015 1.005 - 1.030   pH 8.0 5.0 - 8.0   Glucose, UA NEGATIVE NEGATIVE mg/dL   Hgb urine dipstick MODERATE (A) NEGATIVE   Bilirubin Urine NEGATIVE NEGATIVE   Ketones, ur NEGATIVE NEGATIVE mg/dL   Protein, ur 100 (A) NEGATIVE mg/dL   Urobilinogen, UA 0.2 0.0 - 1.0 mg/dL   Nitrite NEGATIVE NEGATIVE   Leukocytes, UA LARGE (A) NEGATIVE  Urine microscopic-add on     Status: Abnormal   Collection Time: 08/03/14 12:25 PM  Result Value Ref Range   Squamous Epithelial / LPF RARE RARE   WBC, UA TOO NUMEROUS TO COUNT <3 WBC/hpf   RBC / HPF TOO NUMEROUS TO COUNT <3  RBC/hpf   Bacteria, UA MANY (A) RARE  Urine culture     Status: None   Collection Time: 08/03/14 12:25 PM  Result Value Ref Range   Specimen Description URINE, CLEAN CATCH  Bag ped    Special Requests Immunocompromised    Colony Count      >=100,000 COLONIES/ML Performed at [facility] Performed at [facility]    Report Status 08/06/2014 FINAL    Organism ID, Bacteria ESCHERICHIA COLI       Susceptibility   Escherichia coli - MIC*    AMPICILLIN >=32 RESISTANT Resistant     CEFAZOLIN 8 SENSITIVE Sensitive     CEFTRIAXONE <=1 SENSITIVE Sensitive     CIPROFLOXACIN >=4 RESISTANT Resistant     GENTAMICIN >=16 RESISTANT Resistant     LEVOFLOXACIN >=8 RESISTANT Resistant     NITROFURANTOIN <=16 SENSITIVE Sensitive     TOBRAMYCIN 8 INTERMEDIATE Intermediate     TRIMETH/SULFA >=320 RESISTANT Resistant     PIP/TAZO <=4 SENSITIVE Sensitive     * ESCHERICHIA COLI    Assessment/Plan: Left sided sciatica [facility] reviewed. Blood sugar has been well controlled over the past year. Rx Medrol dose pack. Pain medication refill. Supportive measures reviewed. Glycemic control measures discussed with patient. Handout given. Follow-up in one week.  Acute cystitis without hematuria Urine culture reviewed. Sensitive to Cipro. We'll begin Cipro 250 mg --take 1 tablet after each dialysis on Tuesday, Thursday and Saturday. Quantity 3 with 0 refills. Supportive measures reviewed. Follow-up in one week.

## 2014-08-10 NOTE — Addendum Note (Signed)
Addended by: Rockwell Germany on: 08/10/2014 02:17 PM   Modules accepted: Orders

## 2014-08-10 NOTE — Assessment & Plan Note (Addendum)
Urine culture reviewed. Sensitive to Cipro. We'll begin Cipro 250 mg --take 1 tablet after each dialysis on Tuesday, Thursday and Saturday. Quantity 3 with 0 refills. Supportive measures reviewed. Follow-up in one week.

## 2014-08-10 NOTE — Progress Notes (Signed)
Pre visit review using our clinic review tool, if applicable. No additional management support is needed unless otherwise documented below in the visit note/SLS  

## 2014-08-10 NOTE — Addendum Note (Signed)
Addended by: Rockwell Germany on: 08/10/2014 04:41 PM   Modules accepted: Orders

## 2014-08-11 DIAGNOSIS — E8779 Other fluid overload: Secondary | ICD-10-CM | POA: Diagnosis not present

## 2014-08-11 DIAGNOSIS — D631 Anemia in chronic kidney disease: Secondary | ICD-10-CM | POA: Diagnosis not present

## 2014-08-11 DIAGNOSIS — N186 End stage renal disease: Secondary | ICD-10-CM | POA: Diagnosis not present

## 2014-08-13 ENCOUNTER — Telehealth: Payer: Self-pay | Admitting: Physician Assistant

## 2014-08-13 DIAGNOSIS — N186 End stage renal disease: Secondary | ICD-10-CM | POA: Diagnosis not present

## 2014-08-13 DIAGNOSIS — E8779 Other fluid overload: Secondary | ICD-10-CM | POA: Diagnosis not present

## 2014-08-13 DIAGNOSIS — M5432 Sciatica, left side: Secondary | ICD-10-CM

## 2014-08-13 DIAGNOSIS — D631 Anemia in chronic kidney disease: Secondary | ICD-10-CM | POA: Diagnosis not present

## 2014-08-13 NOTE — Telephone Encounter (Signed)
Caller name: jquan Relation to pt: self Call back number: [phone] Pharmacy:  Reason for call:   Patient is wanting to know how long it takes for the steroids that were given at last visit to work. Patient states that he is still hurting.

## 2014-08-13 NOTE — Telephone Encounter (Signed)
Called and spoke with the pt and he stated that his pain is worse.  He is having more pain going down the (L) left, and he does not feel the steroids are helping.  Pt is to follow-up on Monday.  Please advise.//AB/CMA

## 2014-08-14 DIAGNOSIS — I120 Hypertensive chronic kidney disease with stage 5 chronic kidney disease or end stage renal disease: Secondary | ICD-10-CM | POA: Diagnosis not present

## 2014-08-14 DIAGNOSIS — Z992 Dependence on renal dialysis: Secondary | ICD-10-CM | POA: Diagnosis not present

## 2014-08-14 DIAGNOSIS — E119 Type 2 diabetes mellitus without complications: Secondary | ICD-10-CM | POA: Diagnosis not present

## 2014-08-14 DIAGNOSIS — N186 End stage renal disease: Secondary | ICD-10-CM | POA: Diagnosis not present

## 2014-08-14 DIAGNOSIS — Z48816 Encounter for surgical aftercare following surgery on the genitourinary system: Secondary | ICD-10-CM | POA: Diagnosis not present

## 2014-08-14 DIAGNOSIS — Z89512 Acquired absence of left leg below knee: Secondary | ICD-10-CM | POA: Diagnosis not present

## 2014-08-14 MED ORDER — OXYCODONE HCL 5 MG PO TABS: 5.0000 mg | ORAL_TABLET | Freq: Four times a day (QID) | ORAL | Status: DC | PRN

## 2014-08-14 NOTE — Telephone Encounter (Signed)
Rx ready for pickup.Please assess willingness for MRI.

## 2014-08-14 NOTE — Telephone Encounter (Signed)
Patient states that he will run out before Monday appointment and is requesting another oxycodone rx.

## 2014-08-14 NOTE — Telephone Encounter (Signed)
Continue Oxycodone and finish steroid pack. Try to move around because the prolonged sitting is worsening symptoms. He will need an MRI of the lumbar spine to do persistent symptoms. IF symptoms acutely worsen he needs to go to the ER for emergent care. Let me know if he is willing to get MRI and I will place order.

## 2014-08-15 DIAGNOSIS — D631 Anemia in chronic kidney disease: Secondary | ICD-10-CM | POA: Diagnosis not present

## 2014-08-15 DIAGNOSIS — E8779 Other fluid overload: Secondary | ICD-10-CM | POA: Diagnosis not present

## 2014-08-15 DIAGNOSIS — N186 End stage renal disease: Secondary | ICD-10-CM | POA: Diagnosis not present

## 2014-08-17 ENCOUNTER — Ambulatory Visit: Payer: BC Managed Care – PPO | Admitting: Physician Assistant

## 2014-08-17 ENCOUNTER — Encounter: Payer: Self-pay | Admitting: Physician Assistant

## 2014-08-17 ENCOUNTER — Ambulatory Visit (INDEPENDENT_AMBULATORY_CARE_PROVIDER_SITE_OTHER): Payer: Medicare Other | Admitting: Physician Assistant

## 2014-08-17 VITALS — BP 91/66 | HR 83 | Temp 98.5°F | Resp 16 | Ht 71.0 in

## 2014-08-17 DIAGNOSIS — M5416 Radiculopathy, lumbar region: Secondary | ICD-10-CM | POA: Insufficient documentation

## 2014-08-17 NOTE — Progress Notes (Signed)
Patient presents to clinic today c/o continued sciatica despite treatment with pain medications and steroid pack.  Has not been getting up to ambulate and has been sleeping in his wheelchair because it "hurts to get in bed".  Is only taking pain medication "rarely". Denies new or worsening symptoms.  Still with good bowel output.  Denies saddle anesthesia.  Past Medical History  Diagnosis Date  . Sleep apnea     uses cpap  . Hypothyroidism   . Anemia   . Blood transfusion   . Chronic kidney disease     hx of kidney stones  . Hypertension   . Arthritis     osteoarthritis  . Pneumonia   . Diabetes mellitus     insulin dependent  . Peripheral vascular disease   . Hyperlipidemia   . Morbid obesity   . Hypertrophy of prostate   . Acute respiratory failure   . Cellulitis and abscess of leg   . Chronic kidney disease, stage III (moderate)   . Nephrolithiasis   . Anxiety   . Chronic pain   . Embolism and thrombosis of unspecified artery   . DVT (deep venous thrombosis)     Lower Extremity  . Personal history of arthritis     Osteoarthritis  . History of nephrolithiasis     Bilateral  . AR (allergic rhinitis)   . BPH (benign prostatic hypertrophy)   . Displacement of lumbar intervertebral disc without myelopathy     L4-L5  . Cholelithiasis   . Other lymphedema   . Unspecified septicemia   . Impotence of organic origin   . Pancreatitis, acute   . Diverticulosis of colon   . Gallstone     Current Outpatient Prescriptions on File Prior to Visit  Medication Sig Dispense Refill  . atorvastatin (LIPITOR) 40 MG tablet Take 1 tablet (40 mg total) by mouth daily. 90 tablet 1  . ferrous sulfate 325 (65 FE) MG tablet Take 1 tablet (325 mg total) by mouth 2 (two) times daily. 180 tablet 1  . glucose blood (BAYER CONTOUR TEST) test strip USE AS DIRECTED TO CHECK BLOOD SUGARS TWICE DAILY Dx: E11.9 100 each 3  . insulin aspart (NOVOLOG) 100 UNIT/ML injection INJECT 3 UNITS THREE TIMES  DAILY WITH MEALS. AND AT NIGHTIME IF BLOOD SUGARS GREATER THAN 150 DX: E11.29 40 mL 1  . insulin glargine (LANTUS) 100 UNIT/ML injection Inject 0.4 mLs (40 Units total) into the skin at bedtime. 120 mL 1  . Insulin Syringe-Needle U-100 (B-D INS SYRINGE 0.5CC/31GX5/16) 31G X 5/16" 0.5 ML MISC USE 4 TIMES A DAY FOR DIABETES BLOOD GLUCOSE TESTING DX: E11.29 300 each 1  . levothyroxine (SYNTHROID, LEVOTHROID) 200 MCG tablet TAKE 1 TABLET (200 MCG TOTAL) BY MOUTH EVERY MORNING. 90 tablet 1  . levothyroxine (SYNTHROID, LEVOTHROID) 25 MCG tablet TAKE 1 TABLET (25 MCG TOTAL) BY MOUTH DAILY BEFORE BREAKFAST. 90 tablet 1  . Multiple Vitamins-Minerals (MULTIVITAMINS THER. W/MINERALS) TABS Take 1 tablet by mouth daily.     . sevelamer carbonate (RENVELA) 800 MG tablet Take 800 mg by mouth 3 (three) times daily with meals.     No current facility-administered medications on file prior to visit.    No Known Allergies  Family History  Problem Relation Age of Onset  . Diabetes Mother   . Heart attack Mother   . Stroke Mother   . Colon cancer Father   . Dementia Father   . Heart attack Father   . Arthritis  Paternal Grandmother   . Hypertension Other   . Hypothyroidism Other    History   Social History  . Marital Status: Married    Spouse Name: N/A  . Number of Children: N/A  . Years of Education: N/A   Social History Main Topics  . Smoking status: Never Smoker   . Smokeless tobacco: Never Used  . Alcohol Use: No  . Drug Use: No  . Sexual Activity: Yes   Other Topics Concern  . None   Social History Narrative   Lives with his wife and uses a wheelchair for transfers.     Review of Systems - See HPI.  All other ROS are negative.  BP 91/66 mmHg  Pulse 83  Temp(Src) 98.5 F (36.9 C) (Oral)  Resp 16  Ht 5\' 11"  (1.803 m)  Wt   SpO2 99%  Physical Exam  Constitutional: He is oriented to person, place, and time and well-developed, well-nourished, and in no distress.  HENT:  Head:  Normocephalic and atraumatic.  Cardiovascular: Normal rate, regular rhythm, normal heart sounds and intact distal pulses.   Pulmonary/Chest: Effort normal and breath sounds normal. No respiratory distress. He has no wheezes. He has no rales. He exhibits no tenderness.  Musculoskeletal:       Lumbar back: He exhibits pain. He exhibits normal range of motion, no tenderness and no spasm.  Neurological: He is alert and oriented to person, place, and time. No cranial nerve deficit.  Skin: Skin is warm and dry. No rash noted.  Psychiatric: Affect normal.  Vitals reviewed.   Recent Results (from the past 2160 hour(s))  Urinalysis, Routine w reflex microscopic (not at [facility])     Status: Abnormal   Collection Time: 08/03/14 12:25 PM  Result Value Ref Range   Color, Urine YELLOW YELLOW   APPearance TURBID (A) CLEAR   Specific Gravity, Urine 1.015 1.005 - 1.030   pH 8.0 5.0 - 8.0   Glucose, UA NEGATIVE NEGATIVE mg/dL   Hgb urine dipstick MODERATE (A) NEGATIVE   Bilirubin Urine NEGATIVE NEGATIVE   Ketones, ur NEGATIVE NEGATIVE mg/dL   Protein, ur 100 (A) NEGATIVE mg/dL   Urobilinogen, UA 0.2 0.0 - 1.0 mg/dL   Nitrite NEGATIVE NEGATIVE   Leukocytes, UA LARGE (A) NEGATIVE  Urine microscopic-add on     Status: Abnormal   Collection Time: 08/03/14 12:25 PM  Result Value Ref Range   Squamous Epithelial / LPF RARE RARE   WBC, UA TOO NUMEROUS TO COUNT <3 WBC/hpf   RBC / HPF TOO NUMEROUS TO COUNT <3 RBC/hpf   Bacteria, UA MANY (A) RARE  Urine culture     Status: None   Collection Time: 08/03/14 12:25 PM  Result Value Ref Range   Specimen Description URINE, CLEAN CATCH  Bag ped    Special Requests Immunocompromised    Colony Count      >=100,000 COLONIES/ML Performed at [facility] Performed at [facility]    Report Status 08/06/2014 FINAL    Organism ID, Bacteria ESCHERICHIA COLI       Susceptibility   Escherichia coli - MIC*     AMPICILLIN >=32 RESISTANT Resistant     CEFAZOLIN 8 SENSITIVE Sensitive     CEFTRIAXONE <=1 SENSITIVE Sensitive     CIPROFLOXACIN >=4 RESISTANT Resistant     GENTAMICIN >=16 RESISTANT Resistant     LEVOFLOXACIN >=8 RESISTANT Resistant     NITROFURANTOIN <=16  SENSITIVE Sensitive     TOBRAMYCIN 8 INTERMEDIATE Intermediate     TRIMETH/SULFA >=320 RESISTANT Resistant     PIP/TAZO <=4 SENSITIVE Sensitive     * ESCHERICHIA COLI  POCT urinalysis dipstick     Status: Abnormal   Collection Time: 08/10/14  4:37 PM  Result Value Ref Range   Color, UA gold    Clarity, UA murky     Comment: w/pus   Glucose, UA neg    Bilirubin, UA neg    Ketones, UA neg    Spec Grav, UA 1.025    Blood, UA positive 2+    pH, UA 7.5    Protein, UA positive 2+    Urobilinogen, UA 1.0    Nitrite, UA neg    Leukocytes, UA large (3+) (A) Negative    Assessment/Plan: Lumbar radicular pain Oxycodone increased to 10 mg dosing.  Continue HD as scheduled.  Discussed importance of ambulation and constant sitting is placing all weight on lumbar spine.  Reviewed exercises that he can do even with amputation.  Will order MRI to further assess.  If anything worsens he is to proceed to ER for assessment and pain management.

## 2014-08-17 NOTE — Progress Notes (Signed)
Pre visit review using our clinic review tool, if applicable. No additional management support is needed unless otherwise documented below in the visit note/SLS  

## 2014-08-17 NOTE — Patient Instructions (Addendum)
You will be contacted for an MRI of your lower back.  It is very important you have this done.  Wear your prosthetic and get up out of the wheelchair. You should not be sleeping in this -- at least try your reclining couch.  Start some of the exercises below when your wife is home.  I would like for you to try a couple of the easier ones.  Please take 2 of the Oxycodone every 6 hours as needed.  I will set you up with Neurosurgery once MRI results are in.

## 2014-08-18 DIAGNOSIS — D631 Anemia in chronic kidney disease: Secondary | ICD-10-CM | POA: Diagnosis not present

## 2014-08-18 DIAGNOSIS — E8779 Other fluid overload: Secondary | ICD-10-CM | POA: Diagnosis not present

## 2014-08-18 DIAGNOSIS — N186 End stage renal disease: Secondary | ICD-10-CM | POA: Diagnosis not present

## 2014-08-18 NOTE — Assessment & Plan Note (Signed)
Oxycodone increased to 10 mg dosing.  Continue HD as scheduled.  Discussed importance of ambulation and constant sitting is placing all weight on lumbar spine.  Reviewed exercises that he can do even with amputation.  Will order MRI to further assess.  If anything worsens he is to proceed to ER for assessment and pain management.

## 2014-08-19 DIAGNOSIS — E119 Type 2 diabetes mellitus without complications: Secondary | ICD-10-CM | POA: Diagnosis not present

## 2014-08-19 DIAGNOSIS — N186 End stage renal disease: Secondary | ICD-10-CM | POA: Diagnosis not present

## 2014-08-19 DIAGNOSIS — L97811 Non-pressure chronic ulcer of other part of right lower leg limited to breakdown of skin: Secondary | ICD-10-CM | POA: Diagnosis not present

## 2014-08-19 DIAGNOSIS — R2241 Localized swelling, mass and lump, right lower limb: Secondary | ICD-10-CM | POA: Diagnosis not present

## 2014-08-19 DIAGNOSIS — Z48816 Encounter for surgical aftercare following surgery on the genitourinary system: Secondary | ICD-10-CM | POA: Diagnosis not present

## 2014-08-19 DIAGNOSIS — L97211 Non-pressure chronic ulcer of right calf limited to breakdown of skin: Secondary | ICD-10-CM | POA: Diagnosis not present

## 2014-08-19 DIAGNOSIS — Z794 Long term (current) use of insulin: Secondary | ICD-10-CM | POA: Diagnosis not present

## 2014-08-19 DIAGNOSIS — L97511 Non-pressure chronic ulcer of other part of right foot limited to breakdown of skin: Secondary | ICD-10-CM | POA: Diagnosis not present

## 2014-08-19 DIAGNOSIS — I120 Hypertensive chronic kidney disease with stage 5 chronic kidney disease or end stage renal disease: Secondary | ICD-10-CM | POA: Diagnosis not present

## 2014-08-19 DIAGNOSIS — Z89512 Acquired absence of left leg below knee: Secondary | ICD-10-CM | POA: Diagnosis not present

## 2014-08-19 DIAGNOSIS — I872 Venous insufficiency (chronic) (peripheral): Secondary | ICD-10-CM | POA: Diagnosis not present

## 2014-08-19 DIAGNOSIS — Z992 Dependence on renal dialysis: Secondary | ICD-10-CM | POA: Diagnosis not present

## 2014-08-19 DIAGNOSIS — L98493 Non-pressure chronic ulcer of skin of other sites with necrosis of muscle: Secondary | ICD-10-CM | POA: Diagnosis not present

## 2014-08-19 DIAGNOSIS — E11622 Type 2 diabetes mellitus with other skin ulcer: Secondary | ICD-10-CM | POA: Diagnosis not present

## 2014-08-19 DIAGNOSIS — E1140 Type 2 diabetes mellitus with diabetic neuropathy, unspecified: Secondary | ICD-10-CM | POA: Diagnosis not present

## 2014-08-20 DIAGNOSIS — D631 Anemia in chronic kidney disease: Secondary | ICD-10-CM | POA: Diagnosis not present

## 2014-08-20 DIAGNOSIS — E8779 Other fluid overload: Secondary | ICD-10-CM | POA: Diagnosis not present

## 2014-08-20 DIAGNOSIS — N186 End stage renal disease: Secondary | ICD-10-CM | POA: Diagnosis not present

## 2014-08-22 DIAGNOSIS — E8779 Other fluid overload: Secondary | ICD-10-CM | POA: Diagnosis not present

## 2014-08-22 DIAGNOSIS — N186 End stage renal disease: Secondary | ICD-10-CM | POA: Diagnosis not present

## 2014-08-22 DIAGNOSIS — D631 Anemia in chronic kidney disease: Secondary | ICD-10-CM | POA: Diagnosis not present

## 2014-08-24 ENCOUNTER — Other Ambulatory Visit: Payer: Self-pay | Admitting: Physician Assistant

## 2014-08-24 ENCOUNTER — Telehealth: Payer: Self-pay | Admitting: *Deleted

## 2014-08-24 ENCOUNTER — Other Ambulatory Visit: Payer: Self-pay

## 2014-08-24 DIAGNOSIS — I120 Hypertensive chronic kidney disease with stage 5 chronic kidney disease or end stage renal disease: Secondary | ICD-10-CM | POA: Diagnosis not present

## 2014-08-24 DIAGNOSIS — Z89512 Acquired absence of left leg below knee: Secondary | ICD-10-CM | POA: Diagnosis not present

## 2014-08-24 DIAGNOSIS — Z48816 Encounter for surgical aftercare following surgery on the genitourinary system: Secondary | ICD-10-CM | POA: Diagnosis not present

## 2014-08-24 DIAGNOSIS — E119 Type 2 diabetes mellitus without complications: Secondary | ICD-10-CM | POA: Diagnosis not present

## 2014-08-24 DIAGNOSIS — N186 End stage renal disease: Secondary | ICD-10-CM | POA: Diagnosis not present

## 2014-08-24 DIAGNOSIS — Z992 Dependence on renal dialysis: Secondary | ICD-10-CM | POA: Diagnosis not present

## 2014-08-24 NOTE — Telephone Encounter (Signed)
Order request for manual wheelchair with seat cushion was faxed to [facility].  Confirmation received.//AB/CMA

## 2014-08-25 DIAGNOSIS — E8779 Other fluid overload: Secondary | ICD-10-CM | POA: Diagnosis not present

## 2014-08-25 DIAGNOSIS — N186 End stage renal disease: Secondary | ICD-10-CM | POA: Diagnosis not present

## 2014-08-25 DIAGNOSIS — D631 Anemia in chronic kidney disease: Secondary | ICD-10-CM | POA: Diagnosis not present

## 2014-08-26 ENCOUNTER — Telehealth: Payer: Self-pay | Admitting: Physician Assistant

## 2014-08-26 DIAGNOSIS — L97511 Non-pressure chronic ulcer of other part of right foot limited to breakdown of skin: Secondary | ICD-10-CM | POA: Diagnosis not present

## 2014-08-26 DIAGNOSIS — L97811 Non-pressure chronic ulcer of other part of right lower leg limited to breakdown of skin: Secondary | ICD-10-CM | POA: Diagnosis not present

## 2014-08-26 DIAGNOSIS — N186 End stage renal disease: Secondary | ICD-10-CM | POA: Diagnosis not present

## 2014-08-26 DIAGNOSIS — E11622 Type 2 diabetes mellitus with other skin ulcer: Secondary | ICD-10-CM | POA: Diagnosis not present

## 2014-08-26 DIAGNOSIS — I872 Venous insufficiency (chronic) (peripheral): Secondary | ICD-10-CM | POA: Diagnosis not present

## 2014-08-26 DIAGNOSIS — Z794 Long term (current) use of insulin: Secondary | ICD-10-CM | POA: Diagnosis not present

## 2014-08-26 MED ORDER — OXYCODONE HCL 5 MG PO TABS: 5.0000 mg | ORAL_TABLET | Freq: Four times a day (QID) | ORAL | Status: DC | PRN

## 2014-08-26 NOTE — Telephone Encounter (Signed)
Spoke with the pt and informed him that the Rx is ready for him to pick up.//AB/CMA   FYI Jennifer-Can you see if we can get him scheduled this Sat here at [facility] for his MRI.

## 2014-08-26 NOTE — Telephone Encounter (Signed)
Caller name: Jaykub Parsa Relationship to patient: self Can be reached: [phone]  Reason for call: Pt called in for refill on oxycodone. He said that he is taking less to make it last until Thursday but he'd like to have his wife pick up the prescription today if possible. Please call pt to advise.

## 2014-08-26 NOTE — Telephone Encounter (Signed)
Please let patient know I will not be refilling medications again for pain if he isn't wanting to proceed with MRI. He refused having it done at two facilities so the office will now have to re-certify it through insurance so it can be obtained.

## 2014-08-26 NOTE — Telephone Encounter (Signed)
Will grant one refill.  Again he needs to get up and moving around.  I ordered MRI at last visit but do not see where it is scheduled.  Will you speak with patient to see if he has been contacted.  Albin Fischer -- Can you check on status of MRI?

## 2014-08-26 NOTE — Telephone Encounter (Signed)
Caller name: Nevo Klausen Relationship to patient: self Can be reached: [phone] Pharmacy:  Reason for call: Pt said he's not refusing. He said that he didn't know he could change days for dialysis so he wasn't able to do it here on the date offered. He didn't want to go to [place]. He asked if we can get reapproved and scheduled here at [facility]. He would like it done on a Saturday. He is asking if he does schedule it can he get med refill on oxycodone.

## 2014-08-26 NOTE — Telephone Encounter (Signed)
Pt refused to have done at our Imaging and [facility]. I have placed a call to him, asking what imaging facility would he like to use. Will have to get it re-approved with insurance when I find out where he is requesting.

## 2014-08-27 DIAGNOSIS — D631 Anemia in chronic kidney disease: Secondary | ICD-10-CM | POA: Diagnosis not present

## 2014-08-27 DIAGNOSIS — N186 End stage renal disease: Secondary | ICD-10-CM | POA: Diagnosis not present

## 2014-08-27 DIAGNOSIS — E8779 Other fluid overload: Secondary | ICD-10-CM | POA: Diagnosis not present

## 2014-08-27 NOTE — Telephone Encounter (Signed)
Having MRI here at the [facility] on 09/05/14

## 2014-08-28 DIAGNOSIS — Z89512 Acquired absence of left leg below knee: Secondary | ICD-10-CM | POA: Diagnosis not present

## 2014-08-28 DIAGNOSIS — E119 Type 2 diabetes mellitus without complications: Secondary | ICD-10-CM | POA: Diagnosis not present

## 2014-08-28 DIAGNOSIS — I10 Essential (primary) hypertension: Secondary | ICD-10-CM | POA: Diagnosis not present

## 2014-08-28 DIAGNOSIS — D649 Anemia, unspecified: Secondary | ICD-10-CM | POA: Diagnosis not present

## 2014-08-28 DIAGNOSIS — Z992 Dependence on renal dialysis: Secondary | ICD-10-CM | POA: Diagnosis not present

## 2014-08-28 DIAGNOSIS — I120 Hypertensive chronic kidney disease with stage 5 chronic kidney disease or end stage renal disease: Secondary | ICD-10-CM | POA: Diagnosis not present

## 2014-08-28 DIAGNOSIS — Z48816 Encounter for surgical aftercare following surgery on the genitourinary system: Secondary | ICD-10-CM | POA: Diagnosis not present

## 2014-08-28 DIAGNOSIS — N186 End stage renal disease: Secondary | ICD-10-CM | POA: Diagnosis not present

## 2014-08-29 DIAGNOSIS — Z23 Encounter for immunization: Secondary | ICD-10-CM | POA: Diagnosis not present

## 2014-08-29 DIAGNOSIS — N2581 Secondary hyperparathyroidism of renal origin: Secondary | ICD-10-CM | POA: Diagnosis not present

## 2014-08-29 DIAGNOSIS — D631 Anemia in chronic kidney disease: Secondary | ICD-10-CM | POA: Diagnosis not present

## 2014-08-29 DIAGNOSIS — E8779 Other fluid overload: Secondary | ICD-10-CM | POA: Diagnosis not present

## 2014-08-29 DIAGNOSIS — N186 End stage renal disease: Secondary | ICD-10-CM | POA: Diagnosis not present

## 2014-08-31 DIAGNOSIS — Z992 Dependence on renal dialysis: Secondary | ICD-10-CM | POA: Diagnosis not present

## 2014-08-31 DIAGNOSIS — Z89512 Acquired absence of left leg below knee: Secondary | ICD-10-CM | POA: Diagnosis not present

## 2014-08-31 DIAGNOSIS — N186 End stage renal disease: Secondary | ICD-10-CM | POA: Diagnosis not present

## 2014-08-31 DIAGNOSIS — Z48816 Encounter for surgical aftercare following surgery on the genitourinary system: Secondary | ICD-10-CM | POA: Diagnosis not present

## 2014-08-31 DIAGNOSIS — E119 Type 2 diabetes mellitus without complications: Secondary | ICD-10-CM | POA: Diagnosis not present

## 2014-08-31 DIAGNOSIS — I120 Hypertensive chronic kidney disease with stage 5 chronic kidney disease or end stage renal disease: Secondary | ICD-10-CM | POA: Diagnosis not present

## 2014-09-01 DIAGNOSIS — N2581 Secondary hyperparathyroidism of renal origin: Secondary | ICD-10-CM | POA: Diagnosis not present

## 2014-09-01 DIAGNOSIS — E8779 Other fluid overload: Secondary | ICD-10-CM | POA: Diagnosis not present

## 2014-09-01 DIAGNOSIS — N186 End stage renal disease: Secondary | ICD-10-CM | POA: Diagnosis not present

## 2014-09-01 DIAGNOSIS — D631 Anemia in chronic kidney disease: Secondary | ICD-10-CM | POA: Diagnosis not present

## 2014-09-01 DIAGNOSIS — Z23 Encounter for immunization: Secondary | ICD-10-CM | POA: Diagnosis not present

## 2014-09-02 DIAGNOSIS — Z992 Dependence on renal dialysis: Secondary | ICD-10-CM | POA: Diagnosis not present

## 2014-09-02 DIAGNOSIS — N186 End stage renal disease: Secondary | ICD-10-CM | POA: Diagnosis not present

## 2014-09-02 DIAGNOSIS — Z89512 Acquired absence of left leg below knee: Secondary | ICD-10-CM | POA: Diagnosis not present

## 2014-09-02 DIAGNOSIS — I120 Hypertensive chronic kidney disease with stage 5 chronic kidney disease or end stage renal disease: Secondary | ICD-10-CM | POA: Diagnosis not present

## 2014-09-02 DIAGNOSIS — E119 Type 2 diabetes mellitus without complications: Secondary | ICD-10-CM | POA: Diagnosis not present

## 2014-09-02 DIAGNOSIS — Z48816 Encounter for surgical aftercare following surgery on the genitourinary system: Secondary | ICD-10-CM | POA: Diagnosis not present

## 2014-09-03 DIAGNOSIS — K769 Liver disease, unspecified: Secondary | ICD-10-CM | POA: Diagnosis not present

## 2014-09-03 DIAGNOSIS — D631 Anemia in chronic kidney disease: Secondary | ICD-10-CM | POA: Diagnosis not present

## 2014-09-03 DIAGNOSIS — Z23 Encounter for immunization: Secondary | ICD-10-CM | POA: Diagnosis not present

## 2014-09-03 DIAGNOSIS — N186 End stage renal disease: Secondary | ICD-10-CM | POA: Diagnosis not present

## 2014-09-03 DIAGNOSIS — E1165 Type 2 diabetes mellitus with hyperglycemia: Secondary | ICD-10-CM | POA: Diagnosis not present

## 2014-09-03 DIAGNOSIS — E8779 Other fluid overload: Secondary | ICD-10-CM | POA: Diagnosis not present

## 2014-09-03 DIAGNOSIS — N2581 Secondary hyperparathyroidism of renal origin: Secondary | ICD-10-CM | POA: Diagnosis not present

## 2014-09-04 DIAGNOSIS — E119 Type 2 diabetes mellitus without complications: Secondary | ICD-10-CM | POA: Diagnosis not present

## 2014-09-04 DIAGNOSIS — I120 Hypertensive chronic kidney disease with stage 5 chronic kidney disease or end stage renal disease: Secondary | ICD-10-CM | POA: Diagnosis not present

## 2014-09-04 DIAGNOSIS — N2581 Secondary hyperparathyroidism of renal origin: Secondary | ICD-10-CM | POA: Diagnosis not present

## 2014-09-04 DIAGNOSIS — E8779 Other fluid overload: Secondary | ICD-10-CM | POA: Diagnosis not present

## 2014-09-04 DIAGNOSIS — Z48816 Encounter for surgical aftercare following surgery on the genitourinary system: Secondary | ICD-10-CM | POA: Diagnosis not present

## 2014-09-04 DIAGNOSIS — Z89512 Acquired absence of left leg below knee: Secondary | ICD-10-CM | POA: Diagnosis not present

## 2014-09-04 DIAGNOSIS — D631 Anemia in chronic kidney disease: Secondary | ICD-10-CM | POA: Diagnosis not present

## 2014-09-04 DIAGNOSIS — N186 End stage renal disease: Secondary | ICD-10-CM | POA: Diagnosis not present

## 2014-09-04 DIAGNOSIS — Z23 Encounter for immunization: Secondary | ICD-10-CM | POA: Diagnosis not present

## 2014-09-04 DIAGNOSIS — Z992 Dependence on renal dialysis: Secondary | ICD-10-CM | POA: Diagnosis not present

## 2014-09-05 ENCOUNTER — Ambulatory Visit (HOSPITAL_BASED_OUTPATIENT_CLINIC_OR_DEPARTMENT_OTHER)
Admission: RE | Admit: 2014-09-05 | Discharge: 2014-09-05 | Disposition: A | Discharge status: Home / Self Care | Payer: Medicare Other | Source: Ambulatory Visit | Attending: Physician Assistant | Admitting: Physician Assistant

## 2014-09-05 DIAGNOSIS — M47896 Other spondylosis, lumbar region: Secondary | ICD-10-CM | POA: Diagnosis not present

## 2014-09-05 DIAGNOSIS — R29890 Loss of height: Secondary | ICD-10-CM | POA: Diagnosis not present

## 2014-09-05 DIAGNOSIS — M5136 Other intervertebral disc degeneration, lumbar region: Secondary | ICD-10-CM | POA: Insufficient documentation

## 2014-09-05 DIAGNOSIS — M9973 Connective tissue and disc stenosis of intervertebral foramina of lumbar region: Secondary | ICD-10-CM | POA: Diagnosis not present

## 2014-09-05 DIAGNOSIS — M5137 Other intervertebral disc degeneration, lumbosacral region: Secondary | ICD-10-CM | POA: Diagnosis not present

## 2014-09-05 DIAGNOSIS — M4806 Spinal stenosis, lumbar region: Secondary | ICD-10-CM | POA: Diagnosis not present

## 2014-09-05 DIAGNOSIS — M5416 Radiculopathy, lumbar region: Secondary | ICD-10-CM

## 2014-09-05 DIAGNOSIS — M5126 Other intervertebral disc displacement, lumbar region: Secondary | ICD-10-CM | POA: Diagnosis not present

## 2014-09-05 DIAGNOSIS — R599 Enlarged lymph nodes, unspecified: Secondary | ICD-10-CM | POA: Diagnosis not present

## 2014-09-05 DIAGNOSIS — M545 Low back pain: Secondary | ICD-10-CM | POA: Diagnosis present

## 2014-09-07 ENCOUNTER — Telehealth: Payer: Self-pay | Admitting: Physician Assistant

## 2014-09-07 DIAGNOSIS — N2581 Secondary hyperparathyroidism of renal origin: Secondary | ICD-10-CM | POA: Diagnosis not present

## 2014-09-07 DIAGNOSIS — D631 Anemia in chronic kidney disease: Secondary | ICD-10-CM | POA: Diagnosis not present

## 2014-09-07 DIAGNOSIS — M48061 Spinal stenosis, lumbar region without neurogenic claudication: Secondary | ICD-10-CM

## 2014-09-07 DIAGNOSIS — N186 End stage renal disease: Secondary | ICD-10-CM | POA: Diagnosis not present

## 2014-09-07 DIAGNOSIS — E8779 Other fluid overload: Secondary | ICD-10-CM | POA: Diagnosis not present

## 2014-09-07 DIAGNOSIS — Z23 Encounter for immunization: Secondary | ICD-10-CM | POA: Diagnosis not present

## 2014-09-07 NOTE — Telephone Encounter (Signed)
Caller name: Cleven Konda Relationship to patient: self Can be reached: [phone] before 3:45pm                             [phone] after 4:30pm   Reason for call: Pt is currenlty in dialysis. Please call before 3:45pm if you can. Pt states that he called at noon and didn't get a return call. I did not see a tel note from [facility] today. If not before 3:45pm call home # after 4:30pm.

## 2014-09-07 NOTE — Telephone Encounter (Signed)
Called and spoke with the pt and informed him of recent MRI results and note.  Pt verbalized understanding and agreed to the Neurosurgeon referral.//AB/CMA

## 2014-09-07 NOTE — Telephone Encounter (Signed)
Referral placed.

## 2014-09-07 NOTE — Telephone Encounter (Signed)
-----   Message from Brunetta Jeans, PA-C sent at 09/07/2014  7:07 AM EDT ----- MRI reveals multiple areas of nerve compression. Want him to see a Neurosurgeon for evaluation. Let me know if I can set this up for him. It is very important we get this taken care of. He should continue pain medication as directed.

## 2014-09-08 DIAGNOSIS — E1140 Type 2 diabetes mellitus with diabetic neuropathy, unspecified: Secondary | ICD-10-CM | POA: Diagnosis not present

## 2014-09-08 DIAGNOSIS — I872 Venous insufficiency (chronic) (peripheral): Secondary | ICD-10-CM | POA: Diagnosis not present

## 2014-09-08 DIAGNOSIS — E119 Type 2 diabetes mellitus without complications: Secondary | ICD-10-CM | POA: Diagnosis not present

## 2014-09-08 DIAGNOSIS — N186 End stage renal disease: Secondary | ICD-10-CM | POA: Insufficient documentation

## 2014-09-08 DIAGNOSIS — L98499 Non-pressure chronic ulcer of skin of other sites with unspecified severity: Secondary | ICD-10-CM

## 2014-09-08 DIAGNOSIS — L97511 Non-pressure chronic ulcer of other part of right foot limited to breakdown of skin: Secondary | ICD-10-CM | POA: Diagnosis not present

## 2014-09-08 DIAGNOSIS — Z89512 Acquired absence of left leg below knee: Secondary | ICD-10-CM | POA: Diagnosis not present

## 2014-09-08 DIAGNOSIS — I120 Hypertensive chronic kidney disease with stage 5 chronic kidney disease or end stage renal disease: Secondary | ICD-10-CM | POA: Diagnosis not present

## 2014-09-08 DIAGNOSIS — Z992 Dependence on renal dialysis: Secondary | ICD-10-CM | POA: Diagnosis not present

## 2014-09-08 DIAGNOSIS — Z48816 Encounter for surgical aftercare following surgery on the genitourinary system: Secondary | ICD-10-CM | POA: Diagnosis not present

## 2014-09-08 DIAGNOSIS — L97811 Non-pressure chronic ulcer of other part of right lower leg limited to breakdown of skin: Secondary | ICD-10-CM | POA: Diagnosis not present

## 2014-09-08 DIAGNOSIS — E11622 Type 2 diabetes mellitus with other skin ulcer: Secondary | ICD-10-CM | POA: Insufficient documentation

## 2014-09-08 DIAGNOSIS — Z794 Long term (current) use of insulin: Secondary | ICD-10-CM | POA: Diagnosis not present

## 2014-09-10 ENCOUNTER — Telehealth: Payer: Self-pay | Admitting: Physician Assistant

## 2014-09-10 DIAGNOSIS — E8779 Other fluid overload: Secondary | ICD-10-CM | POA: Diagnosis not present

## 2014-09-10 DIAGNOSIS — Z23 Encounter for immunization: Secondary | ICD-10-CM | POA: Diagnosis not present

## 2014-09-10 DIAGNOSIS — D631 Anemia in chronic kidney disease: Secondary | ICD-10-CM | POA: Diagnosis not present

## 2014-09-10 DIAGNOSIS — Z794 Long term (current) use of insulin: Secondary | ICD-10-CM | POA: Diagnosis not present

## 2014-09-10 DIAGNOSIS — E119 Type 2 diabetes mellitus without complications: Secondary | ICD-10-CM | POA: Diagnosis not present

## 2014-09-10 DIAGNOSIS — I120 Hypertensive chronic kidney disease with stage 5 chronic kidney disease or end stage renal disease: Secondary | ICD-10-CM | POA: Diagnosis not present

## 2014-09-10 DIAGNOSIS — S81801D Unspecified open wound, right lower leg, subsequent encounter: Secondary | ICD-10-CM | POA: Diagnosis not present

## 2014-09-10 DIAGNOSIS — N186 End stage renal disease: Secondary | ICD-10-CM | POA: Diagnosis not present

## 2014-09-10 DIAGNOSIS — Z89512 Acquired absence of left leg below knee: Secondary | ICD-10-CM | POA: Diagnosis not present

## 2014-09-10 DIAGNOSIS — N2581 Secondary hyperparathyroidism of renal origin: Secondary | ICD-10-CM | POA: Diagnosis not present

## 2014-09-10 DIAGNOSIS — Z992 Dependence on renal dialysis: Secondary | ICD-10-CM | POA: Diagnosis not present

## 2014-09-10 DIAGNOSIS — Z936 Other artificial openings of urinary tract status: Secondary | ICD-10-CM | POA: Diagnosis not present

## 2014-09-10 NOTE — Telephone Encounter (Signed)
Caller name: chris from advanced Relation to pt: Call back number: [phone] Pharmacy:  Reason for call:   Needing verbal orders to continue PT.

## 2014-09-11 ENCOUNTER — Telehealth: Payer: Self-pay | Admitting: Physician Assistant

## 2014-09-11 DIAGNOSIS — I120 Hypertensive chronic kidney disease with stage 5 chronic kidney disease or end stage renal disease: Secondary | ICD-10-CM | POA: Diagnosis not present

## 2014-09-11 DIAGNOSIS — E119 Type 2 diabetes mellitus without complications: Secondary | ICD-10-CM | POA: Diagnosis not present

## 2014-09-11 DIAGNOSIS — Z89512 Acquired absence of left leg below knee: Secondary | ICD-10-CM | POA: Diagnosis not present

## 2014-09-11 DIAGNOSIS — N186 End stage renal disease: Secondary | ICD-10-CM | POA: Diagnosis not present

## 2014-09-11 DIAGNOSIS — Z992 Dependence on renal dialysis: Secondary | ICD-10-CM | POA: Diagnosis not present

## 2014-09-11 DIAGNOSIS — S81801D Unspecified open wound, right lower leg, subsequent encounter: Secondary | ICD-10-CM | POA: Diagnosis not present

## 2014-09-11 NOTE — Telephone Encounter (Signed)
Lind for #30, if he wants to wait until [name] returns for more- that's ok too

## 2014-09-11 NOTE — Telephone Encounter (Signed)
Patient returning your call.

## 2014-09-11 NOTE — Telephone Encounter (Signed)
Called and spoke with the pt and informed him of the note below.  Pt verbalized understanding and agreed to wait until next week when [facility] returns.//AB/CMA

## 2014-09-11 NOTE — Telephone Encounter (Signed)
Relation to pt: self  Call back number: [phone]   Reason for call:  Patient requesting a refill oxyCODONE (ROXICODONE) 5 MG immediate release tablet.patient states he has enough medication to get him thru Tuesday

## 2014-09-11 NOTE — Telephone Encounter (Signed)
Called and spoke with Gerald Stabs with [facility] and he was given verbal orders from the note below.//AB/CMA

## 2014-09-11 NOTE — Telephone Encounter (Signed)
Please advise.//AB/CMA 

## 2014-09-11 NOTE — Telephone Encounter (Signed)
Requesting Oxycodone 5mg  -Take 1 tablet by mouth every 6 hours as needed for severe pain. Last refill:08/26/14;#60,0 Last OV:08/17/14 UDS:Not done Please advise.//AB/CMA

## 2014-09-11 NOTE — Telephone Encounter (Signed)
Ok to continue per PT recommendations

## 2014-09-11 NOTE — Telephone Encounter (Signed)
Called and [facility] @ 2:30pm @ [phone]) asking the pt to RTC regarding med request.//AB/CMA

## 2014-09-12 DIAGNOSIS — N186 End stage renal disease: Secondary | ICD-10-CM | POA: Diagnosis not present

## 2014-09-12 DIAGNOSIS — N2581 Secondary hyperparathyroidism of renal origin: Secondary | ICD-10-CM | POA: Diagnosis not present

## 2014-09-12 DIAGNOSIS — D631 Anemia in chronic kidney disease: Secondary | ICD-10-CM | POA: Diagnosis not present

## 2014-09-12 DIAGNOSIS — E8779 Other fluid overload: Secondary | ICD-10-CM | POA: Diagnosis not present

## 2014-09-12 DIAGNOSIS — Z23 Encounter for immunization: Secondary | ICD-10-CM | POA: Diagnosis not present

## 2014-09-14 DIAGNOSIS — M4647 Discitis, unspecified, lumbosacral region: Secondary | ICD-10-CM | POA: Diagnosis not present

## 2014-09-14 DIAGNOSIS — Z6834 Body mass index (BMI) 34.0-34.9, adult: Secondary | ICD-10-CM | POA: Diagnosis not present

## 2014-09-14 MED ORDER — OXYCODONE HCL 5 MG PO TABS: 5.0000 mg | ORAL_TABLET | Freq: Four times a day (QID) | ORAL | Status: DC | PRN

## 2014-09-14 NOTE — Telephone Encounter (Signed)
Refill at front desk for pick up. Will need appointment if needing any further refills before seeing Neurosurgeon.

## 2014-09-14 NOTE — Addendum Note (Signed)
Addended by: Raiford Noble on: 09/14/2014 09:24 AM   Modules accepted: Orders

## 2014-09-14 NOTE — Telephone Encounter (Signed)
Called and [facility] @ 11:10am @ [phone]) informing the pt of the note below.//AB/CMA

## 2014-09-15 ENCOUNTER — Other Ambulatory Visit: Payer: Self-pay | Admitting: Neurosurgery

## 2014-09-15 DIAGNOSIS — M4647 Discitis, unspecified, lumbosacral region: Secondary | ICD-10-CM

## 2014-09-15 DIAGNOSIS — Z23 Encounter for immunization: Secondary | ICD-10-CM | POA: Diagnosis not present

## 2014-09-15 DIAGNOSIS — E8779 Other fluid overload: Secondary | ICD-10-CM | POA: Diagnosis not present

## 2014-09-15 DIAGNOSIS — D631 Anemia in chronic kidney disease: Secondary | ICD-10-CM | POA: Diagnosis not present

## 2014-09-15 DIAGNOSIS — N2581 Secondary hyperparathyroidism of renal origin: Secondary | ICD-10-CM | POA: Diagnosis not present

## 2014-09-15 DIAGNOSIS — N186 End stage renal disease: Secondary | ICD-10-CM | POA: Diagnosis not present

## 2014-09-16 DIAGNOSIS — Z992 Dependence on renal dialysis: Secondary | ICD-10-CM | POA: Diagnosis not present

## 2014-09-16 DIAGNOSIS — N186 End stage renal disease: Secondary | ICD-10-CM | POA: Diagnosis not present

## 2014-09-16 DIAGNOSIS — L97811 Non-pressure chronic ulcer of other part of right lower leg limited to breakdown of skin: Secondary | ICD-10-CM | POA: Diagnosis not present

## 2014-09-16 DIAGNOSIS — Z794 Long term (current) use of insulin: Secondary | ICD-10-CM | POA: Diagnosis not present

## 2014-09-16 DIAGNOSIS — E119 Type 2 diabetes mellitus without complications: Secondary | ICD-10-CM | POA: Diagnosis not present

## 2014-09-16 DIAGNOSIS — E11622 Type 2 diabetes mellitus with other skin ulcer: Secondary | ICD-10-CM | POA: Diagnosis not present

## 2014-09-16 DIAGNOSIS — S81801D Unspecified open wound, right lower leg, subsequent encounter: Secondary | ICD-10-CM | POA: Diagnosis not present

## 2014-09-16 DIAGNOSIS — Z89512 Acquired absence of left leg below knee: Secondary | ICD-10-CM | POA: Diagnosis not present

## 2014-09-16 DIAGNOSIS — I120 Hypertensive chronic kidney disease with stage 5 chronic kidney disease or end stage renal disease: Secondary | ICD-10-CM | POA: Diagnosis not present

## 2014-09-16 DIAGNOSIS — E1140 Type 2 diabetes mellitus with diabetic neuropathy, unspecified: Secondary | ICD-10-CM | POA: Diagnosis not present

## 2014-09-16 DIAGNOSIS — I872 Venous insufficiency (chronic) (peripheral): Secondary | ICD-10-CM | POA: Diagnosis not present

## 2014-09-17 DIAGNOSIS — D631 Anemia in chronic kidney disease: Secondary | ICD-10-CM | POA: Diagnosis not present

## 2014-09-17 DIAGNOSIS — E8779 Other fluid overload: Secondary | ICD-10-CM | POA: Diagnosis not present

## 2014-09-17 DIAGNOSIS — Z23 Encounter for immunization: Secondary | ICD-10-CM | POA: Diagnosis not present

## 2014-09-17 DIAGNOSIS — N2581 Secondary hyperparathyroidism of renal origin: Secondary | ICD-10-CM | POA: Diagnosis not present

## 2014-09-17 DIAGNOSIS — N186 End stage renal disease: Secondary | ICD-10-CM | POA: Diagnosis not present

## 2014-09-18 DIAGNOSIS — E119 Type 2 diabetes mellitus without complications: Secondary | ICD-10-CM | POA: Diagnosis not present

## 2014-09-18 DIAGNOSIS — Z7902 Long term (current) use of antithrombotics/antiplatelets: Secondary | ICD-10-CM | POA: Diagnosis not present

## 2014-09-18 DIAGNOSIS — S81801D Unspecified open wound, right lower leg, subsequent encounter: Secondary | ICD-10-CM | POA: Diagnosis not present

## 2014-09-18 DIAGNOSIS — I120 Hypertensive chronic kidney disease with stage 5 chronic kidney disease or end stage renal disease: Secondary | ICD-10-CM | POA: Diagnosis not present

## 2014-09-18 DIAGNOSIS — N186 End stage renal disease: Secondary | ICD-10-CM | POA: Diagnosis not present

## 2014-09-18 DIAGNOSIS — Z992 Dependence on renal dialysis: Secondary | ICD-10-CM | POA: Diagnosis not present

## 2014-09-18 DIAGNOSIS — Z0000 Encounter for general adult medical examination without abnormal findings: Secondary | ICD-10-CM | POA: Diagnosis not present

## 2014-09-18 DIAGNOSIS — Z89512 Acquired absence of left leg below knee: Secondary | ICD-10-CM | POA: Diagnosis not present

## 2014-09-19 DIAGNOSIS — N2581 Secondary hyperparathyroidism of renal origin: Secondary | ICD-10-CM | POA: Diagnosis not present

## 2014-09-19 DIAGNOSIS — Z23 Encounter for immunization: Secondary | ICD-10-CM | POA: Diagnosis not present

## 2014-09-19 DIAGNOSIS — N186 End stage renal disease: Secondary | ICD-10-CM | POA: Diagnosis not present

## 2014-09-19 DIAGNOSIS — E8779 Other fluid overload: Secondary | ICD-10-CM | POA: Diagnosis not present

## 2014-09-19 DIAGNOSIS — D631 Anemia in chronic kidney disease: Secondary | ICD-10-CM | POA: Diagnosis not present

## 2014-09-21 DIAGNOSIS — S81801D Unspecified open wound, right lower leg, subsequent encounter: Secondary | ICD-10-CM | POA: Diagnosis not present

## 2014-09-21 DIAGNOSIS — Z992 Dependence on renal dialysis: Secondary | ICD-10-CM | POA: Diagnosis not present

## 2014-09-21 DIAGNOSIS — E11622 Type 2 diabetes mellitus with other skin ulcer: Secondary | ICD-10-CM | POA: Diagnosis not present

## 2014-09-21 DIAGNOSIS — N186 End stage renal disease: Secondary | ICD-10-CM | POA: Diagnosis not present

## 2014-09-21 DIAGNOSIS — I120 Hypertensive chronic kidney disease with stage 5 chronic kidney disease or end stage renal disease: Secondary | ICD-10-CM | POA: Diagnosis not present

## 2014-09-21 DIAGNOSIS — Z89512 Acquired absence of left leg below knee: Secondary | ICD-10-CM | POA: Diagnosis not present

## 2014-09-21 DIAGNOSIS — Z794 Long term (current) use of insulin: Secondary | ICD-10-CM | POA: Diagnosis not present

## 2014-09-21 DIAGNOSIS — I872 Venous insufficiency (chronic) (peripheral): Secondary | ICD-10-CM | POA: Diagnosis not present

## 2014-09-21 DIAGNOSIS — L97811 Non-pressure chronic ulcer of other part of right lower leg limited to breakdown of skin: Secondary | ICD-10-CM | POA: Diagnosis not present

## 2014-09-21 DIAGNOSIS — E119 Type 2 diabetes mellitus without complications: Secondary | ICD-10-CM | POA: Diagnosis not present

## 2014-09-21 DIAGNOSIS — E1140 Type 2 diabetes mellitus with diabetic neuropathy, unspecified: Secondary | ICD-10-CM | POA: Diagnosis not present

## 2014-09-22 DIAGNOSIS — Z23 Encounter for immunization: Secondary | ICD-10-CM | POA: Diagnosis not present

## 2014-09-22 DIAGNOSIS — D631 Anemia in chronic kidney disease: Secondary | ICD-10-CM | POA: Diagnosis not present

## 2014-09-22 DIAGNOSIS — N186 End stage renal disease: Secondary | ICD-10-CM | POA: Diagnosis not present

## 2014-09-22 DIAGNOSIS — N2581 Secondary hyperparathyroidism of renal origin: Secondary | ICD-10-CM | POA: Diagnosis not present

## 2014-09-22 DIAGNOSIS — E8779 Other fluid overload: Secondary | ICD-10-CM | POA: Diagnosis not present

## 2014-09-23 DIAGNOSIS — E119 Type 2 diabetes mellitus without complications: Secondary | ICD-10-CM | POA: Diagnosis not present

## 2014-09-23 DIAGNOSIS — Z89512 Acquired absence of left leg below knee: Secondary | ICD-10-CM | POA: Diagnosis not present

## 2014-09-23 DIAGNOSIS — I120 Hypertensive chronic kidney disease with stage 5 chronic kidney disease or end stage renal disease: Secondary | ICD-10-CM | POA: Diagnosis not present

## 2014-09-23 DIAGNOSIS — Z992 Dependence on renal dialysis: Secondary | ICD-10-CM | POA: Diagnosis not present

## 2014-09-23 DIAGNOSIS — N186 End stage renal disease: Secondary | ICD-10-CM | POA: Diagnosis not present

## 2014-09-23 DIAGNOSIS — S81801D Unspecified open wound, right lower leg, subsequent encounter: Secondary | ICD-10-CM | POA: Diagnosis not present

## 2014-09-24 DIAGNOSIS — D631 Anemia in chronic kidney disease: Secondary | ICD-10-CM | POA: Diagnosis not present

## 2014-09-24 DIAGNOSIS — N2581 Secondary hyperparathyroidism of renal origin: Secondary | ICD-10-CM | POA: Diagnosis not present

## 2014-09-24 DIAGNOSIS — E8779 Other fluid overload: Secondary | ICD-10-CM | POA: Diagnosis not present

## 2014-09-24 DIAGNOSIS — N186 End stage renal disease: Secondary | ICD-10-CM | POA: Diagnosis not present

## 2014-09-24 DIAGNOSIS — Z23 Encounter for immunization: Secondary | ICD-10-CM | POA: Diagnosis not present

## 2014-09-25 ENCOUNTER — Other Ambulatory Visit: Payer: Self-pay

## 2014-09-25 ENCOUNTER — Ambulatory Visit
Admission: RE | Admit: 2014-09-25 | Discharge: 2014-09-25 | Disposition: A | Discharge status: Home / Self Care | Payer: Medicare Other | Source: Ambulatory Visit | Attending: Neurosurgery | Admitting: Neurosurgery

## 2014-09-25 DIAGNOSIS — S81801D Unspecified open wound, right lower leg, subsequent encounter: Secondary | ICD-10-CM | POA: Diagnosis not present

## 2014-09-25 DIAGNOSIS — N186 End stage renal disease: Secondary | ICD-10-CM | POA: Diagnosis not present

## 2014-09-25 DIAGNOSIS — Z992 Dependence on renal dialysis: Secondary | ICD-10-CM | POA: Diagnosis not present

## 2014-09-25 DIAGNOSIS — I120 Hypertensive chronic kidney disease with stage 5 chronic kidney disease or end stage renal disease: Secondary | ICD-10-CM | POA: Diagnosis not present

## 2014-09-25 DIAGNOSIS — E119 Type 2 diabetes mellitus without complications: Secondary | ICD-10-CM | POA: Diagnosis not present

## 2014-09-25 DIAGNOSIS — Z89512 Acquired absence of left leg below knee: Secondary | ICD-10-CM | POA: Diagnosis not present

## 2014-09-25 DIAGNOSIS — M4647 Discitis, unspecified, lumbosacral region: Secondary | ICD-10-CM

## 2014-09-26 DIAGNOSIS — Z23 Encounter for immunization: Secondary | ICD-10-CM | POA: Diagnosis not present

## 2014-09-26 DIAGNOSIS — E8779 Other fluid overload: Secondary | ICD-10-CM | POA: Diagnosis not present

## 2014-09-26 DIAGNOSIS — D631 Anemia in chronic kidney disease: Secondary | ICD-10-CM | POA: Diagnosis not present

## 2014-09-26 DIAGNOSIS — N186 End stage renal disease: Secondary | ICD-10-CM | POA: Diagnosis not present

## 2014-09-26 DIAGNOSIS — N2581 Secondary hyperparathyroidism of renal origin: Secondary | ICD-10-CM | POA: Diagnosis not present

## 2014-09-28 DIAGNOSIS — Z992 Dependence on renal dialysis: Secondary | ICD-10-CM | POA: Diagnosis not present

## 2014-09-28 DIAGNOSIS — I120 Hypertensive chronic kidney disease with stage 5 chronic kidney disease or end stage renal disease: Secondary | ICD-10-CM | POA: Diagnosis not present

## 2014-09-28 DIAGNOSIS — I10 Essential (primary) hypertension: Secondary | ICD-10-CM | POA: Diagnosis not present

## 2014-09-28 DIAGNOSIS — D649 Anemia, unspecified: Secondary | ICD-10-CM | POA: Diagnosis not present

## 2014-09-28 DIAGNOSIS — E119 Type 2 diabetes mellitus without complications: Secondary | ICD-10-CM | POA: Diagnosis not present

## 2014-09-28 DIAGNOSIS — S81801D Unspecified open wound, right lower leg, subsequent encounter: Secondary | ICD-10-CM | POA: Diagnosis not present

## 2014-09-28 DIAGNOSIS — N186 End stage renal disease: Secondary | ICD-10-CM | POA: Diagnosis not present

## 2014-09-28 DIAGNOSIS — Z89512 Acquired absence of left leg below knee: Secondary | ICD-10-CM | POA: Diagnosis not present

## 2014-09-29 DIAGNOSIS — N186 End stage renal disease: Secondary | ICD-10-CM | POA: Diagnosis not present

## 2014-09-29 DIAGNOSIS — D631 Anemia in chronic kidney disease: Secondary | ICD-10-CM | POA: Diagnosis not present

## 2014-09-30 ENCOUNTER — Telehealth: Payer: Self-pay | Admitting: Physician Assistant

## 2014-09-30 NOTE — Telephone Encounter (Signed)
Caller name: [name] with [name] Can be reached: [phone]  Reason for call: [name] received CMN for oxygen back but the "answers" were not filled out correctly. She states there was an attached coversheet that had the answers on it. They cannot use as is because it does not qualify patient for oxygen. She is refaxing again now. Please call her back.

## 2014-10-01 DIAGNOSIS — D631 Anemia in chronic kidney disease: Secondary | ICD-10-CM | POA: Diagnosis not present

## 2014-10-01 DIAGNOSIS — N186 End stage renal disease: Secondary | ICD-10-CM | POA: Diagnosis not present

## 2014-10-01 DIAGNOSIS — E1165 Type 2 diabetes mellitus with hyperglycemia: Secondary | ICD-10-CM | POA: Diagnosis not present

## 2014-10-01 DIAGNOSIS — K769 Liver disease, unspecified: Secondary | ICD-10-CM | POA: Diagnosis not present

## 2014-10-01 NOTE — Telephone Encounter (Signed)
Have not received

## 2014-10-02 ENCOUNTER — Ambulatory Visit (INDEPENDENT_AMBULATORY_CARE_PROVIDER_SITE_OTHER): Payer: Medicare Other | Admitting: Medical

## 2014-10-02 ENCOUNTER — Ambulatory Visit (HOSPITAL_BASED_OUTPATIENT_CLINIC_OR_DEPARTMENT_OTHER)
Admission: RE | Admit: 2014-10-02 | Discharge: 2014-10-02 | Disposition: A | Discharge status: Home / Self Care | Payer: Medicare Other | Source: Ambulatory Visit | Attending: Medical | Admitting: Medical

## 2014-10-02 ENCOUNTER — Encounter: Payer: Medicare Other | Admitting: Medical

## 2014-10-02 ENCOUNTER — Telehealth: Payer: Self-pay | Admitting: Medical

## 2014-10-02 ENCOUNTER — Encounter: Payer: Self-pay | Admitting: Medical

## 2014-10-02 VITALS — BP 93/68 | HR 96 | Temp 98.4°F | Ht 71.0 in | Wt 250.0 lb

## 2014-10-02 DIAGNOSIS — T148 Other injury of unspecified body region: Secondary | ICD-10-CM

## 2014-10-02 DIAGNOSIS — W050XXA Fall from non-moving wheelchair, initial encounter: Secondary | ICD-10-CM | POA: Insufficient documentation

## 2014-10-02 DIAGNOSIS — IMO0002 Reserved for concepts with insufficient information to code with codable children: Secondary | ICD-10-CM

## 2014-10-02 DIAGNOSIS — Z89512 Acquired absence of left leg below knee: Secondary | ICD-10-CM | POA: Diagnosis not present

## 2014-10-02 DIAGNOSIS — I120 Hypertensive chronic kidney disease with stage 5 chronic kidney disease or end stage renal disease: Secondary | ICD-10-CM | POA: Diagnosis not present

## 2014-10-02 DIAGNOSIS — M545 Low back pain, unspecified: Secondary | ICD-10-CM

## 2014-10-02 DIAGNOSIS — N186 End stage renal disease: Secondary | ICD-10-CM | POA: Diagnosis not present

## 2014-10-02 DIAGNOSIS — M4184 Other forms of scoliosis, thoracic region: Secondary | ICD-10-CM | POA: Insufficient documentation

## 2014-10-02 DIAGNOSIS — E119 Type 2 diabetes mellitus without complications: Secondary | ICD-10-CM | POA: Diagnosis not present

## 2014-10-02 DIAGNOSIS — R0781 Pleurodynia: Secondary | ICD-10-CM | POA: Diagnosis not present

## 2014-10-02 DIAGNOSIS — S81801D Unspecified open wound, right lower leg, subsequent encounter: Secondary | ICD-10-CM | POA: Diagnosis not present

## 2014-10-02 DIAGNOSIS — Z992 Dependence on renal dialysis: Secondary | ICD-10-CM | POA: Diagnosis not present

## 2014-10-02 DIAGNOSIS — S299XXA Unspecified injury of thorax, initial encounter: Secondary | ICD-10-CM | POA: Diagnosis not present

## 2014-10-02 MED ORDER — GABAPENTIN 100 MG PO CAPS: 100.0000 mg | ORAL_CAPSULE | Freq: Three times a day (TID) | ORAL | Status: DC

## 2014-10-02 MED ORDER — OXYCODONE HCL 5 MG PO TABS: 5.0000 mg | ORAL_TABLET | Freq: Four times a day (QID) | ORAL | Status: DC | PRN

## 2014-10-02 NOTE — Patient Instructions (Addendum)
Refill his ocxycodone. Cautioned on not to overuse.  Rx neurontin for nerve related pain from fx.  Get xray of ribs of chest/ribs today.  Regarding skin area left buttox show wound care center on Monday.(family stated no breakdown only faint red)  Follow up 2-3 wks with Elyn Aquas PA-C

## 2014-10-02 NOTE — Progress Notes (Signed)
This encounter was created in error - please disregard.

## 2014-10-02 NOTE — Progress Notes (Signed)
Subjective:    Patient ID: [name], male    DOB: May 16, 1955, 59 y.o.   MRN: [id number]  HPI  Pt in for some back pain. Pt in for follow up. Pt has some lower back pain and some sciatica type pain. Pt saw neurosurgeon last Friday. Pt had some imaging studies. But pt not aware of next step. Pt saw [place] [facility]. Pt tells me that infection as cause of pain was ruled out. Pt states pain about 6- 8 wks. Pt has some compression of the l5 veterbal body resulting loss of 40% of vertebral body. This compression fracture looks subacute. The summary of mri and consensus of radiologist is fracture and not infection.   Also he was trying to adjust left lower ext sock. He fell over and hit his rt rib area. Now hurts to touch and deep inspiration.   Pt has mild red area left buttox recenlty. No open wound per family. Pt is seeing wound center on Monday.   Review of Systems  Constitutional: Negative for fever, chills and fatigue.  Respiratory: Negative for cough, chest tightness, shortness of breath and wheezing.   Cardiovascular: Negative for chest pain and palpitations.  Musculoskeletal: Positive for back pain.       Rib pain. Rt side.  Skin:       Lt buttox mid red area. No ulcer yet per pt.  Hematological: Negative for adenopathy. Does not bruise/bleed easily.  Psychiatric/Behavioral: Negative for behavioral problems and confusion.    Past Medical History  Diagnosis Date  . Sleep apnea     uses cpap  . Hypothyroidism   . Anemia   . Blood transfusion   . Chronic kidney disease     hx of kidney stones  . Hypertension   . Arthritis     osteoarthritis  . Pneumonia   . Diabetes mellitus     insulin dependent  . Peripheral vascular disease   . Hyperlipidemia   . Morbid obesity   . Hypertrophy of prostate   . Acute respiratory failure   . Cellulitis and abscess of leg   . Chronic kidney disease, stage III (moderate)   . Nephrolithiasis   . Anxiety   . Chronic pain   .  Embolism and thrombosis of unspecified artery   . DVT (deep venous thrombosis)     Lower Extremity  . Personal history of arthritis     Osteoarthritis  . History of nephrolithiasis     Bilateral  . AR (allergic rhinitis)   . BPH (benign prostatic hypertrophy)   . Displacement of lumbar intervertebral disc without myelopathy     L4-L5  . Cholelithiasis   . Other lymphedema   . Unspecified septicemia   . Impotence of organic origin   . Pancreatitis, acute   . Diverticulosis of colon   . Gallstone     History   Social History  . Marital Status: Married    Spouse Name: N/A  . Number of Children: N/A  . Years of Education: N/A   Occupational History  . Not on file.   Social History Main Topics  . Smoking status: Never Smoker   . Smokeless tobacco: Never Used  . Alcohol Use: No  . Drug Use: No  . Sexual Activity: Yes   Other Topics Concern  . Not on file   Social History Narrative   Lives with his wife and uses a wheelchair for transfers.      Past Surgical History  Procedure Laterality Date  . Bilteral hip      replacement & revison  's   . Knee surgery      left knee    . Tonsillectomy    . Amputation  02/07/2011    Procedure: AMPUTATION BELOW KNEE;  Surgeon: Newt Minion, MD;  Location: [place];  Service: Orthopedics;  Laterality: Left;  Left Below Knee Amputation  . Cystoscopy w/ retrogrades Bilateral 10/24/2012    Procedure: CYSTOSCOPY WITH RETROGRADE PYELOGRAM Procedure: Cystoscopy, Bilateral Retrogarde Pyelograms, Bladder Biopsy, Hydrodistension;  Surgeon: Franchot Gallo, MD;  Location: [facility];  Service: Urology;  Laterality: Bilateral;  . Leg amputation below knee    . Kidney surgery    . Replacement total knee      Family History  Problem Relation Age of Onset  . Diabetes Mother   . Heart attack Mother   . Stroke Mother   . Colon cancer Father   . Dementia Father   . Heart attack Father   . Arthritis Paternal Grandmother   . Hypertension Other    . Hypothyroidism Other     No Known Allergies  Current Outpatient Prescriptions on File Prior to Visit  Medication Sig Dispense Refill  . atorvastatin (LIPITOR) 40 MG tablet Take 1 tablet (40 mg total) by mouth daily. 90 tablet 1  . ferrous sulfate 325 (65 FE) MG tablet Take 1 tablet (325 mg total) by mouth 2 (two) times daily. 180 tablet 1  . glucose blood (BAYER CONTOUR TEST) test strip USE AS DIRECTED TO CHECK BLOOD SUGARS TWICE DAILY Dx: E11.9 100 each 3  . insulin aspart (NOVOLOG) 100 UNIT/ML injection INJECT 3 UNITS THREE TIMES DAILY WITH MEALS. AND AT NIGHTIME IF BLOOD SUGARS GREATER THAN 150 DX: E11.29 40 mL 1  . insulin glargine (LANTUS) 100 UNIT/ML injection Inject 0.4 mLs (40 Units total) into the skin at bedtime. 120 mL 1  . Insulin Syringe-Needle U-100 (B-D INS SYRINGE 0.5CC/31GX5/16) 31G X 5/16" 0.5 ML MISC USE 4 TIMES A DAY FOR DIABETES BLOOD GLUCOSE TESTING DX: E11.29 300 each 1  . levothyroxine (SYNTHROID, LEVOTHROID) 200 MCG tablet TAKE 1 TABLET (200 MCG TOTAL) BY MOUTH EVERY MORNING. 90 tablet 1  . levothyroxine (SYNTHROID, LEVOTHROID) 25 MCG tablet TAKE 1 TABLET (25 MCG TOTAL) BY MOUTH DAILY BEFORE BREAKFAST. 90 tablet 1  . Multiple Vitamins-Minerals (MULTIVITAMINS THER. W/MINERALS) TABS Take 1 tablet by mouth daily.     Marland Kitchen oxyCODONE (ROXICODONE) 5 MG immediate release tablet Take 1 tablet (5 mg total) by mouth every 6 (six) hours as needed for severe pain. 60 tablet 0  . sevelamer carbonate (RENVELA) 800 MG tablet Take 800 mg by mouth 3 (three) times daily with meals.     No current facility-administered medications on file prior to visit.    BP 93/68 mmHg  Pulse 96  Temp(Src) 98.4 F (36.9 C) (Oral)  Ht 5\' 11"  (1.803 m)  Wt 250 lb (113.399 kg)  BMI 34.88 kg/m2  SpO2 97%       Objective:   Physical Exam  General- No acute distress. Pleasant patient. Neck- Full range of motion, no jvd Lungs- Clear, even and unlabored. Heart- regular rate and  rhythm. Neurologic- CNII- XII grossly intact. Back- mid lumbar pain on palpation. Appears to be in pain sitting in wheel chair. Rt side ribs- bruised. Pain on palpation and breathing.           Assessment & Plan:  Refill his ocxycodone. Cautioned on not to overuse.  Rx neurontin for nerve related pain from fx.  Get xray of ribs of chest/ribs today.  Regarding skin area left buttox show wound care center on Monday.  Follow up 2-3 wks with Elyn Aquas PA-C

## 2014-10-02 NOTE — Progress Notes (Signed)
Pre visit review using our clinic review tool, if applicable. No additional management support is needed unless otherwise documented below in the visit note. 

## 2014-10-02 NOTE — Telephone Encounter (Signed)
Pt was no show today 10/02/14 8:15am, pt was called many times yesterday due to being scheduled for a 15 min appt and comments stating pt needs 30 min appts, follow up for medications, 8/5 8:31am pt called stating he got msg yesterday but wasn't able to call back so he just didn't come this morning, asked pt if he can come at 1:30pm today, pt hung up.  Charge for no show?

## 2014-10-02 NOTE — Telephone Encounter (Signed)
No charge. 

## 2014-10-03 DIAGNOSIS — N186 End stage renal disease: Secondary | ICD-10-CM | POA: Diagnosis not present

## 2014-10-03 DIAGNOSIS — D631 Anemia in chronic kidney disease: Secondary | ICD-10-CM | POA: Diagnosis not present

## 2014-10-05 ENCOUNTER — Telehealth: Payer: Self-pay | Admitting: Physician Assistant

## 2014-10-05 DIAGNOSIS — E11622 Type 2 diabetes mellitus with other skin ulcer: Secondary | ICD-10-CM | POA: Diagnosis not present

## 2014-10-05 DIAGNOSIS — L89310 Pressure ulcer of right buttock, unstageable: Secondary | ICD-10-CM | POA: Diagnosis not present

## 2014-10-05 DIAGNOSIS — L97811 Non-pressure chronic ulcer of other part of right lower leg limited to breakdown of skin: Secondary | ICD-10-CM | POA: Diagnosis not present

## 2014-10-05 DIAGNOSIS — E1140 Type 2 diabetes mellitus with diabetic neuropathy, unspecified: Secondary | ICD-10-CM | POA: Diagnosis not present

## 2014-10-05 DIAGNOSIS — Z794 Long term (current) use of insulin: Secondary | ICD-10-CM | POA: Diagnosis not present

## 2014-10-05 DIAGNOSIS — N186 End stage renal disease: Secondary | ICD-10-CM | POA: Diagnosis not present

## 2014-10-05 DIAGNOSIS — L89152 Pressure ulcer of sacral region, stage 2: Secondary | ICD-10-CM | POA: Diagnosis not present

## 2014-10-05 DIAGNOSIS — I872 Venous insufficiency (chronic) (peripheral): Secondary | ICD-10-CM | POA: Diagnosis not present

## 2014-10-05 NOTE — Telephone Encounter (Signed)
Pt returning call. Best # [phone].

## 2014-10-05 NOTE — Telephone Encounter (Signed)
Spoke with patient regarding XR results.

## 2014-10-06 DIAGNOSIS — Z96643 Presence of artificial hip joint, bilateral: Secondary | ICD-10-CM | POA: Diagnosis not present

## 2014-10-06 DIAGNOSIS — F0631 Mood disorder due to known physiological condition with depressive features: Secondary | ICD-10-CM | POA: Diagnosis not present

## 2014-10-06 DIAGNOSIS — L89899 Pressure ulcer of other site, unspecified stage: Secondary | ICD-10-CM | POA: Diagnosis not present

## 2014-10-06 DIAGNOSIS — I70209 Unspecified atherosclerosis of native arteries of extremities, unspecified extremity: Secondary | ICD-10-CM | POA: Diagnosis not present

## 2014-10-06 DIAGNOSIS — N189 Chronic kidney disease, unspecified: Secondary | ICD-10-CM | POA: Diagnosis not present

## 2014-10-06 DIAGNOSIS — I739 Peripheral vascular disease, unspecified: Secondary | ICD-10-CM | POA: Diagnosis present

## 2014-10-06 DIAGNOSIS — M6281 Muscle weakness (generalized): Secondary | ICD-10-CM | POA: Diagnosis not present

## 2014-10-06 DIAGNOSIS — G894 Chronic pain syndrome: Secondary | ICD-10-CM | POA: Diagnosis present

## 2014-10-06 DIAGNOSIS — R2689 Other abnormalities of gait and mobility: Secondary | ICD-10-CM | POA: Diagnosis not present

## 2014-10-06 DIAGNOSIS — D649 Anemia, unspecified: Secondary | ICD-10-CM | POA: Diagnosis not present

## 2014-10-06 DIAGNOSIS — F419 Anxiety disorder, unspecified: Secondary | ICD-10-CM | POA: Diagnosis not present

## 2014-10-06 DIAGNOSIS — Z9889 Other specified postprocedural states: Secondary | ICD-10-CM | POA: Diagnosis not present

## 2014-10-06 DIAGNOSIS — T82858D Stenosis of vascular prosthetic devices, implants and grafts, subsequent encounter: Secondary | ICD-10-CM | POA: Diagnosis not present

## 2014-10-06 DIAGNOSIS — I10 Essential (primary) hypertension: Secondary | ICD-10-CM | POA: Diagnosis not present

## 2014-10-06 DIAGNOSIS — D638 Anemia in other chronic diseases classified elsewhere: Secondary | ICD-10-CM | POA: Diagnosis present

## 2014-10-06 DIAGNOSIS — Z96652 Presence of left artificial knee joint: Secondary | ICD-10-CM | POA: Diagnosis not present

## 2014-10-06 DIAGNOSIS — E1140 Type 2 diabetes mellitus with diabetic neuropathy, unspecified: Secondary | ICD-10-CM | POA: Diagnosis present

## 2014-10-06 DIAGNOSIS — L89219 Pressure ulcer of right hip, unspecified stage: Secondary | ICD-10-CM | POA: Diagnosis not present

## 2014-10-06 DIAGNOSIS — I890 Lymphedema, not elsewhere classified: Secondary | ICD-10-CM | POA: Diagnosis present

## 2014-10-06 DIAGNOSIS — L8943 Pressure ulcer of contiguous site of back, buttock and hip, stage 3: Secondary | ICD-10-CM | POA: Diagnosis not present

## 2014-10-06 DIAGNOSIS — G5621 Lesion of ulnar nerve, right upper limb: Secondary | ICD-10-CM | POA: Diagnosis not present

## 2014-10-06 DIAGNOSIS — R269 Unspecified abnormalities of gait and mobility: Secondary | ICD-10-CM | POA: Diagnosis not present

## 2014-10-06 DIAGNOSIS — E039 Hypothyroidism, unspecified: Secondary | ICD-10-CM | POA: Diagnosis present

## 2014-10-06 DIAGNOSIS — N186 End stage renal disease: Secondary | ICD-10-CM | POA: Diagnosis present

## 2014-10-06 DIAGNOSIS — E785 Hyperlipidemia, unspecified: Secondary | ICD-10-CM | POA: Diagnosis not present

## 2014-10-06 DIAGNOSIS — Z992 Dependence on renal dialysis: Secondary | ICD-10-CM | POA: Diagnosis not present

## 2014-10-06 DIAGNOSIS — E1121 Type 2 diabetes mellitus with diabetic nephropathy: Secondary | ICD-10-CM | POA: Diagnosis present

## 2014-10-06 DIAGNOSIS — E43 Unspecified severe protein-calorie malnutrition: Secondary | ICD-10-CM | POA: Diagnosis present

## 2014-10-06 DIAGNOSIS — M5431 Sciatica, right side: Secondary | ICD-10-CM | POA: Diagnosis not present

## 2014-10-06 DIAGNOSIS — G9341 Metabolic encephalopathy: Secondary | ICD-10-CM | POA: Diagnosis not present

## 2014-10-06 DIAGNOSIS — L8990 Pressure ulcer of unspecified site, unspecified stage: Secondary | ICD-10-CM | POA: Diagnosis not present

## 2014-10-06 DIAGNOSIS — M5410 Radiculopathy, site unspecified: Secondary | ICD-10-CM | POA: Diagnosis present

## 2014-10-06 DIAGNOSIS — L89309 Pressure ulcer of unspecified buttock, unspecified stage: Secondary | ICD-10-CM | POA: Diagnosis not present

## 2014-10-06 DIAGNOSIS — L89313 Pressure ulcer of right buttock, stage 3: Secondary | ICD-10-CM | POA: Diagnosis present

## 2014-10-06 DIAGNOSIS — L89152 Pressure ulcer of sacral region, stage 2: Secondary | ICD-10-CM | POA: Diagnosis present

## 2014-10-06 DIAGNOSIS — F329 Major depressive disorder, single episode, unspecified: Secondary | ICD-10-CM | POA: Diagnosis not present

## 2014-10-06 DIAGNOSIS — R278 Other lack of coordination: Secondary | ICD-10-CM | POA: Diagnosis not present

## 2014-10-06 DIAGNOSIS — M158 Other polyosteoarthritis: Secondary | ICD-10-CM | POA: Diagnosis present

## 2014-10-06 DIAGNOSIS — L89310 Pressure ulcer of right buttock, unstageable: Secondary | ICD-10-CM | POA: Diagnosis not present

## 2014-10-06 DIAGNOSIS — I120 Hypertensive chronic kidney disease with stage 5 chronic kidney disease or end stage renal disease: Secondary | ICD-10-CM | POA: Diagnosis present

## 2014-10-06 DIAGNOSIS — R5381 Other malaise: Secondary | ICD-10-CM | POA: Diagnosis not present

## 2014-10-06 DIAGNOSIS — E119 Type 2 diabetes mellitus without complications: Secondary | ICD-10-CM | POA: Diagnosis not present

## 2014-10-06 DIAGNOSIS — T82898A Other specified complication of vascular prosthetic devices, implants and grafts, initial encounter: Secondary | ICD-10-CM | POA: Diagnosis not present

## 2014-10-06 DIAGNOSIS — N250 Renal osteodystrophy: Secondary | ICD-10-CM | POA: Diagnosis present

## 2014-10-06 DIAGNOSIS — D631 Anemia in chronic kidney disease: Secondary | ICD-10-CM | POA: Diagnosis not present

## 2014-10-06 NOTE — Telephone Encounter (Signed)
Still have not received.

## 2014-10-15 DIAGNOSIS — N186 End stage renal disease: Secondary | ICD-10-CM | POA: Diagnosis not present

## 2014-10-15 DIAGNOSIS — Z992 Dependence on renal dialysis: Secondary | ICD-10-CM | POA: Diagnosis not present

## 2014-10-15 DIAGNOSIS — E119 Type 2 diabetes mellitus without complications: Secondary | ICD-10-CM | POA: Diagnosis not present

## 2014-10-16 ENCOUNTER — Ambulatory Visit: Payer: Medicare Other | Admitting: Physician Assistant

## 2014-10-19 ENCOUNTER — Telehealth: Payer: Self-pay | Admitting: Physician Assistant

## 2014-10-19 NOTE — Telephone Encounter (Signed)
No charge as he relies on others for transportation

## 2014-10-19 NOTE — Telephone Encounter (Signed)
Pt was no show 10/16/14 1:30pm, 30 min office visit, left msg for pt to call and reschedule, charge for no show?

## 2014-10-21 DIAGNOSIS — T82898A Other specified complication of vascular prosthetic devices, implants and grafts, initial encounter: Secondary | ICD-10-CM | POA: Diagnosis not present

## 2014-10-21 DIAGNOSIS — T82858D Stenosis of vascular prosthetic devices, implants and grafts, subsequent encounter: Secondary | ICD-10-CM | POA: Diagnosis not present

## 2014-10-21 DIAGNOSIS — Z992 Dependence on renal dialysis: Secondary | ICD-10-CM | POA: Diagnosis not present

## 2014-10-21 DIAGNOSIS — N186 End stage renal disease: Secondary | ICD-10-CM | POA: Diagnosis not present

## 2014-10-28 ENCOUNTER — Other Ambulatory Visit: Payer: Self-pay | Admitting: Medical

## 2014-10-29 DIAGNOSIS — I10 Essential (primary) hypertension: Secondary | ICD-10-CM | POA: Diagnosis not present

## 2014-10-29 DIAGNOSIS — N186 End stage renal disease: Secondary | ICD-10-CM | POA: Diagnosis not present

## 2014-10-29 DIAGNOSIS — D631 Anemia in chronic kidney disease: Secondary | ICD-10-CM | POA: Diagnosis not present

## 2014-11-05 DIAGNOSIS — Z992 Dependence on renal dialysis: Secondary | ICD-10-CM | POA: Diagnosis not present

## 2014-11-05 DIAGNOSIS — N186 End stage renal disease: Secondary | ICD-10-CM | POA: Diagnosis not present

## 2014-11-06 DIAGNOSIS — D509 Iron deficiency anemia, unspecified: Secondary | ICD-10-CM | POA: Diagnosis not present

## 2014-11-06 DIAGNOSIS — G9341 Metabolic encephalopathy: Secondary | ICD-10-CM | POA: Diagnosis not present

## 2014-11-06 DIAGNOSIS — N186 End stage renal disease: Secondary | ICD-10-CM | POA: Diagnosis not present

## 2014-11-06 DIAGNOSIS — E1169 Type 2 diabetes mellitus with other specified complication: Secondary | ICD-10-CM | POA: Diagnosis not present

## 2014-11-06 DIAGNOSIS — M6281 Muscle weakness (generalized): Secondary | ICD-10-CM | POA: Diagnosis not present

## 2014-11-06 DIAGNOSIS — H66009 Acute suppurative otitis media without spontaneous rupture of ear drum, unspecified ear: Secondary | ICD-10-CM | POA: Diagnosis not present

## 2014-11-06 DIAGNOSIS — I10 Essential (primary) hypertension: Secondary | ICD-10-CM | POA: Diagnosis not present

## 2014-11-06 DIAGNOSIS — Z79899 Other long term (current) drug therapy: Secondary | ICD-10-CM | POA: Diagnosis not present

## 2014-11-06 DIAGNOSIS — E1165 Type 2 diabetes mellitus with hyperglycemia: Secondary | ICD-10-CM | POA: Diagnosis not present

## 2014-11-06 DIAGNOSIS — Z23 Encounter for immunization: Secondary | ICD-10-CM | POA: Diagnosis not present

## 2014-11-06 DIAGNOSIS — M545 Low back pain: Secondary | ICD-10-CM | POA: Diagnosis not present

## 2014-11-06 DIAGNOSIS — R5381 Other malaise: Secondary | ICD-10-CM | POA: Diagnosis not present

## 2014-11-06 DIAGNOSIS — R278 Other lack of coordination: Secondary | ICD-10-CM | POA: Diagnosis not present

## 2014-11-06 DIAGNOSIS — E1121 Type 2 diabetes mellitus with diabetic nephropathy: Secondary | ICD-10-CM | POA: Diagnosis not present

## 2014-11-06 DIAGNOSIS — R269 Unspecified abnormalities of gait and mobility: Secondary | ICD-10-CM | POA: Diagnosis not present

## 2014-11-06 DIAGNOSIS — L89219 Pressure ulcer of right hip, unspecified stage: Secondary | ICD-10-CM | POA: Diagnosis not present

## 2014-11-06 DIAGNOSIS — I871 Compression of vein: Secondary | ICD-10-CM | POA: Diagnosis not present

## 2014-11-06 DIAGNOSIS — M79601 Pain in right arm: Secondary | ICD-10-CM | POA: Diagnosis not present

## 2014-11-06 DIAGNOSIS — Z114 Encounter for screening for human immunodeficiency virus [HIV]: Secondary | ICD-10-CM | POA: Diagnosis not present

## 2014-11-06 DIAGNOSIS — K769 Liver disease, unspecified: Secondary | ICD-10-CM | POA: Diagnosis not present

## 2014-11-06 DIAGNOSIS — F329 Major depressive disorder, single episode, unspecified: Secondary | ICD-10-CM | POA: Diagnosis not present

## 2014-11-06 DIAGNOSIS — Z992 Dependence on renal dialysis: Secondary | ICD-10-CM | POA: Diagnosis not present

## 2014-11-06 DIAGNOSIS — L89214 Pressure ulcer of right hip, stage 4: Secondary | ICD-10-CM | POA: Diagnosis not present

## 2014-11-06 DIAGNOSIS — Z5189 Encounter for other specified aftercare: Secondary | ICD-10-CM | POA: Diagnosis not present

## 2014-11-06 DIAGNOSIS — D631 Anemia in chronic kidney disease: Secondary | ICD-10-CM | POA: Diagnosis not present

## 2014-11-06 DIAGNOSIS — H60339 Swimmer's ear, unspecified ear: Secondary | ICD-10-CM | POA: Diagnosis not present

## 2014-11-06 DIAGNOSIS — E1151 Type 2 diabetes mellitus with diabetic peripheral angiopathy without gangrene: Secondary | ICD-10-CM | POA: Diagnosis not present

## 2014-11-06 DIAGNOSIS — N2581 Secondary hyperparathyroidism of renal origin: Secondary | ICD-10-CM | POA: Diagnosis not present

## 2014-11-06 DIAGNOSIS — L8990 Pressure ulcer of unspecified site, unspecified stage: Secondary | ICD-10-CM | POA: Diagnosis not present

## 2014-11-06 DIAGNOSIS — S72401D Unspecified fracture of lower end of right femur, subsequent encounter for closed fracture with routine healing: Secondary | ICD-10-CM | POA: Diagnosis not present

## 2014-11-06 DIAGNOSIS — D649 Anemia, unspecified: Secondary | ICD-10-CM | POA: Diagnosis not present

## 2014-11-06 DIAGNOSIS — E119 Type 2 diabetes mellitus without complications: Secondary | ICD-10-CM | POA: Diagnosis not present

## 2014-11-06 DIAGNOSIS — E1140 Type 2 diabetes mellitus with diabetic neuropathy, unspecified: Secondary | ICD-10-CM | POA: Diagnosis not present

## 2014-11-06 DIAGNOSIS — E039 Hypothyroidism, unspecified: Secondary | ICD-10-CM | POA: Diagnosis not present

## 2014-11-06 DIAGNOSIS — I70203 Unspecified atherosclerosis of native arteries of extremities, bilateral legs: Secondary | ICD-10-CM | POA: Diagnosis not present

## 2014-11-06 DIAGNOSIS — L89310 Pressure ulcer of right buttock, unstageable: Secondary | ICD-10-CM | POA: Diagnosis not present

## 2014-11-07 DIAGNOSIS — Z23 Encounter for immunization: Secondary | ICD-10-CM | POA: Diagnosis not present

## 2014-11-07 DIAGNOSIS — D631 Anemia in chronic kidney disease: Secondary | ICD-10-CM | POA: Diagnosis not present

## 2014-11-07 DIAGNOSIS — N186 End stage renal disease: Secondary | ICD-10-CM | POA: Diagnosis not present

## 2014-11-09 DIAGNOSIS — Z5189 Encounter for other specified aftercare: Secondary | ICD-10-CM | POA: Diagnosis not present

## 2014-11-09 DIAGNOSIS — N186 End stage renal disease: Secondary | ICD-10-CM | POA: Diagnosis not present

## 2014-11-10 DIAGNOSIS — N186 End stage renal disease: Secondary | ICD-10-CM | POA: Diagnosis not present

## 2014-11-10 DIAGNOSIS — Z23 Encounter for immunization: Secondary | ICD-10-CM | POA: Diagnosis not present

## 2014-11-10 DIAGNOSIS — D631 Anemia in chronic kidney disease: Secondary | ICD-10-CM | POA: Diagnosis not present

## 2014-11-12 DIAGNOSIS — D631 Anemia in chronic kidney disease: Secondary | ICD-10-CM | POA: Diagnosis not present

## 2014-11-12 DIAGNOSIS — Z23 Encounter for immunization: Secondary | ICD-10-CM | POA: Diagnosis not present

## 2014-11-12 DIAGNOSIS — N186 End stage renal disease: Secondary | ICD-10-CM | POA: Diagnosis not present

## 2014-11-14 DIAGNOSIS — N186 End stage renal disease: Secondary | ICD-10-CM | POA: Diagnosis not present

## 2014-11-14 DIAGNOSIS — Z23 Encounter for immunization: Secondary | ICD-10-CM | POA: Diagnosis not present

## 2014-11-14 DIAGNOSIS — D631 Anemia in chronic kidney disease: Secondary | ICD-10-CM | POA: Diagnosis not present

## 2014-11-16 DIAGNOSIS — L89214 Pressure ulcer of right hip, stage 4: Secondary | ICD-10-CM | POA: Diagnosis not present

## 2014-11-16 DIAGNOSIS — E1121 Type 2 diabetes mellitus with diabetic nephropathy: Secondary | ICD-10-CM | POA: Diagnosis not present

## 2014-11-16 DIAGNOSIS — E1169 Type 2 diabetes mellitus with other specified complication: Secondary | ICD-10-CM | POA: Diagnosis not present

## 2014-11-16 DIAGNOSIS — N186 End stage renal disease: Secondary | ICD-10-CM | POA: Diagnosis not present

## 2014-11-16 DIAGNOSIS — I70203 Unspecified atherosclerosis of native arteries of extremities, bilateral legs: Secondary | ICD-10-CM | POA: Diagnosis not present

## 2014-11-16 DIAGNOSIS — M545 Low back pain: Secondary | ICD-10-CM | POA: Diagnosis not present

## 2014-11-16 DIAGNOSIS — E1151 Type 2 diabetes mellitus with diabetic peripheral angiopathy without gangrene: Secondary | ICD-10-CM | POA: Diagnosis not present

## 2014-11-16 DIAGNOSIS — E039 Hypothyroidism, unspecified: Secondary | ICD-10-CM | POA: Diagnosis not present

## 2014-11-16 DIAGNOSIS — E1140 Type 2 diabetes mellitus with diabetic neuropathy, unspecified: Secondary | ICD-10-CM | POA: Diagnosis not present

## 2014-11-16 DIAGNOSIS — Z5189 Encounter for other specified aftercare: Secondary | ICD-10-CM | POA: Diagnosis not present

## 2014-11-17 DIAGNOSIS — N186 End stage renal disease: Secondary | ICD-10-CM | POA: Diagnosis not present

## 2014-11-17 DIAGNOSIS — D631 Anemia in chronic kidney disease: Secondary | ICD-10-CM | POA: Diagnosis not present

## 2014-11-17 DIAGNOSIS — Z23 Encounter for immunization: Secondary | ICD-10-CM | POA: Diagnosis not present

## 2014-11-19 DIAGNOSIS — N186 End stage renal disease: Secondary | ICD-10-CM | POA: Diagnosis not present

## 2014-11-19 DIAGNOSIS — Z23 Encounter for immunization: Secondary | ICD-10-CM | POA: Diagnosis not present

## 2014-11-19 DIAGNOSIS — D631 Anemia in chronic kidney disease: Secondary | ICD-10-CM | POA: Diagnosis not present

## 2014-11-21 DIAGNOSIS — N186 End stage renal disease: Secondary | ICD-10-CM | POA: Diagnosis not present

## 2014-11-21 DIAGNOSIS — D631 Anemia in chronic kidney disease: Secondary | ICD-10-CM | POA: Diagnosis not present

## 2014-11-21 DIAGNOSIS — Z23 Encounter for immunization: Secondary | ICD-10-CM | POA: Diagnosis not present

## 2014-11-24 DIAGNOSIS — Z23 Encounter for immunization: Secondary | ICD-10-CM | POA: Diagnosis not present

## 2014-11-24 DIAGNOSIS — D631 Anemia in chronic kidney disease: Secondary | ICD-10-CM | POA: Diagnosis not present

## 2014-11-24 DIAGNOSIS — N186 End stage renal disease: Secondary | ICD-10-CM | POA: Diagnosis not present

## 2014-11-26 DIAGNOSIS — D631 Anemia in chronic kidney disease: Secondary | ICD-10-CM | POA: Diagnosis not present

## 2014-11-26 DIAGNOSIS — N186 End stage renal disease: Secondary | ICD-10-CM | POA: Diagnosis not present

## 2014-11-26 DIAGNOSIS — Z23 Encounter for immunization: Secondary | ICD-10-CM | POA: Diagnosis not present

## 2014-11-28 DIAGNOSIS — D631 Anemia in chronic kidney disease: Secondary | ICD-10-CM | POA: Diagnosis not present

## 2014-11-28 DIAGNOSIS — N186 End stage renal disease: Secondary | ICD-10-CM | POA: Diagnosis not present

## 2014-11-28 DIAGNOSIS — Z23 Encounter for immunization: Secondary | ICD-10-CM | POA: Diagnosis not present

## 2014-11-28 DIAGNOSIS — I10 Essential (primary) hypertension: Secondary | ICD-10-CM | POA: Diagnosis not present

## 2014-11-28 DIAGNOSIS — N2581 Secondary hyperparathyroidism of renal origin: Secondary | ICD-10-CM | POA: Diagnosis not present

## 2014-11-30 DIAGNOSIS — N186 End stage renal disease: Secondary | ICD-10-CM | POA: Diagnosis not present

## 2014-11-30 DIAGNOSIS — M79601 Pain in right arm: Secondary | ICD-10-CM | POA: Diagnosis not present

## 2014-11-30 DIAGNOSIS — Z992 Dependence on renal dialysis: Secondary | ICD-10-CM | POA: Diagnosis not present

## 2014-12-01 DIAGNOSIS — N2581 Secondary hyperparathyroidism of renal origin: Secondary | ICD-10-CM | POA: Diagnosis not present

## 2014-12-01 DIAGNOSIS — N186 End stage renal disease: Secondary | ICD-10-CM | POA: Diagnosis not present

## 2014-12-01 DIAGNOSIS — D631 Anemia in chronic kidney disease: Secondary | ICD-10-CM | POA: Diagnosis not present

## 2014-12-01 DIAGNOSIS — Z23 Encounter for immunization: Secondary | ICD-10-CM | POA: Diagnosis not present

## 2014-12-03 DIAGNOSIS — Z23 Encounter for immunization: Secondary | ICD-10-CM | POA: Diagnosis not present

## 2014-12-03 DIAGNOSIS — D631 Anemia in chronic kidney disease: Secondary | ICD-10-CM | POA: Diagnosis not present

## 2014-12-03 DIAGNOSIS — N186 End stage renal disease: Secondary | ICD-10-CM | POA: Diagnosis not present

## 2014-12-03 DIAGNOSIS — N2581 Secondary hyperparathyroidism of renal origin: Secondary | ICD-10-CM | POA: Diagnosis not present

## 2014-12-05 DIAGNOSIS — N2581 Secondary hyperparathyroidism of renal origin: Secondary | ICD-10-CM | POA: Diagnosis not present

## 2014-12-05 DIAGNOSIS — D631 Anemia in chronic kidney disease: Secondary | ICD-10-CM | POA: Diagnosis not present

## 2014-12-05 DIAGNOSIS — Z23 Encounter for immunization: Secondary | ICD-10-CM | POA: Diagnosis not present

## 2014-12-05 DIAGNOSIS — N186 End stage renal disease: Secondary | ICD-10-CM | POA: Diagnosis not present

## 2014-12-07 DIAGNOSIS — L89214 Pressure ulcer of right hip, stage 4: Secondary | ICD-10-CM | POA: Diagnosis not present

## 2014-12-08 DIAGNOSIS — N2581 Secondary hyperparathyroidism of renal origin: Secondary | ICD-10-CM | POA: Diagnosis not present

## 2014-12-08 DIAGNOSIS — D631 Anemia in chronic kidney disease: Secondary | ICD-10-CM | POA: Diagnosis not present

## 2014-12-08 DIAGNOSIS — N186 End stage renal disease: Secondary | ICD-10-CM | POA: Diagnosis not present

## 2014-12-08 DIAGNOSIS — Z23 Encounter for immunization: Secondary | ICD-10-CM | POA: Diagnosis not present

## 2014-12-09 DIAGNOSIS — I871 Compression of vein: Secondary | ICD-10-CM | POA: Diagnosis not present

## 2014-12-09 DIAGNOSIS — N186 End stage renal disease: Secondary | ICD-10-CM | POA: Diagnosis not present

## 2014-12-10 DIAGNOSIS — N186 End stage renal disease: Secondary | ICD-10-CM | POA: Diagnosis not present

## 2014-12-10 DIAGNOSIS — N2581 Secondary hyperparathyroidism of renal origin: Secondary | ICD-10-CM | POA: Diagnosis not present

## 2014-12-10 DIAGNOSIS — D631 Anemia in chronic kidney disease: Secondary | ICD-10-CM | POA: Diagnosis not present

## 2014-12-10 DIAGNOSIS — Z23 Encounter for immunization: Secondary | ICD-10-CM | POA: Diagnosis not present

## 2014-12-12 DIAGNOSIS — N186 End stage renal disease: Secondary | ICD-10-CM | POA: Diagnosis not present

## 2014-12-12 DIAGNOSIS — Z23 Encounter for immunization: Secondary | ICD-10-CM | POA: Diagnosis not present

## 2014-12-12 DIAGNOSIS — N2581 Secondary hyperparathyroidism of renal origin: Secondary | ICD-10-CM | POA: Diagnosis not present

## 2014-12-12 DIAGNOSIS — D631 Anemia in chronic kidney disease: Secondary | ICD-10-CM | POA: Diagnosis not present

## 2014-12-14 DIAGNOSIS — E1121 Type 2 diabetes mellitus with diabetic nephropathy: Secondary | ICD-10-CM | POA: Diagnosis not present

## 2014-12-14 DIAGNOSIS — E1169 Type 2 diabetes mellitus with other specified complication: Secondary | ICD-10-CM | POA: Diagnosis not present

## 2014-12-14 DIAGNOSIS — Z5189 Encounter for other specified aftercare: Secondary | ICD-10-CM | POA: Diagnosis not present

## 2014-12-14 DIAGNOSIS — E1151 Type 2 diabetes mellitus with diabetic peripheral angiopathy without gangrene: Secondary | ICD-10-CM | POA: Diagnosis not present

## 2014-12-14 DIAGNOSIS — E1140 Type 2 diabetes mellitus with diabetic neuropathy, unspecified: Secondary | ICD-10-CM | POA: Diagnosis not present

## 2014-12-14 DIAGNOSIS — L89214 Pressure ulcer of right hip, stage 4: Secondary | ICD-10-CM | POA: Diagnosis not present

## 2014-12-14 DIAGNOSIS — M545 Low back pain: Secondary | ICD-10-CM | POA: Diagnosis not present

## 2014-12-14 DIAGNOSIS — I70203 Unspecified atherosclerosis of native arteries of extremities, bilateral legs: Secondary | ICD-10-CM | POA: Diagnosis not present

## 2014-12-14 DIAGNOSIS — N186 End stage renal disease: Secondary | ICD-10-CM | POA: Diagnosis not present

## 2014-12-14 DIAGNOSIS — E039 Hypothyroidism, unspecified: Secondary | ICD-10-CM | POA: Diagnosis not present

## 2014-12-15 DIAGNOSIS — Z23 Encounter for immunization: Secondary | ICD-10-CM | POA: Diagnosis not present

## 2014-12-15 DIAGNOSIS — D631 Anemia in chronic kidney disease: Secondary | ICD-10-CM | POA: Diagnosis not present

## 2014-12-15 DIAGNOSIS — N186 End stage renal disease: Secondary | ICD-10-CM | POA: Diagnosis not present

## 2014-12-15 DIAGNOSIS — N2581 Secondary hyperparathyroidism of renal origin: Secondary | ICD-10-CM | POA: Diagnosis not present

## 2014-12-17 DIAGNOSIS — D631 Anemia in chronic kidney disease: Secondary | ICD-10-CM | POA: Diagnosis not present

## 2014-12-17 DIAGNOSIS — Z23 Encounter for immunization: Secondary | ICD-10-CM | POA: Diagnosis not present

## 2014-12-17 DIAGNOSIS — N2581 Secondary hyperparathyroidism of renal origin: Secondary | ICD-10-CM | POA: Diagnosis not present

## 2014-12-17 DIAGNOSIS — N186 End stage renal disease: Secondary | ICD-10-CM | POA: Diagnosis not present

## 2014-12-19 DIAGNOSIS — N2581 Secondary hyperparathyroidism of renal origin: Secondary | ICD-10-CM | POA: Diagnosis not present

## 2014-12-19 DIAGNOSIS — D631 Anemia in chronic kidney disease: Secondary | ICD-10-CM | POA: Diagnosis not present

## 2014-12-19 DIAGNOSIS — N186 End stage renal disease: Secondary | ICD-10-CM | POA: Diagnosis not present

## 2014-12-19 DIAGNOSIS — Z23 Encounter for immunization: Secondary | ICD-10-CM | POA: Diagnosis not present

## 2014-12-21 DIAGNOSIS — L89214 Pressure ulcer of right hip, stage 4: Secondary | ICD-10-CM | POA: Diagnosis not present

## 2014-12-22 DIAGNOSIS — H66009 Acute suppurative otitis media without spontaneous rupture of ear drum, unspecified ear: Secondary | ICD-10-CM | POA: Diagnosis not present

## 2014-12-22 DIAGNOSIS — N2581 Secondary hyperparathyroidism of renal origin: Secondary | ICD-10-CM | POA: Diagnosis not present

## 2014-12-22 DIAGNOSIS — Z23 Encounter for immunization: Secondary | ICD-10-CM | POA: Diagnosis not present

## 2014-12-22 DIAGNOSIS — D631 Anemia in chronic kidney disease: Secondary | ICD-10-CM | POA: Diagnosis not present

## 2014-12-22 DIAGNOSIS — N186 End stage renal disease: Secondary | ICD-10-CM | POA: Diagnosis not present

## 2014-12-24 DIAGNOSIS — N186 End stage renal disease: Secondary | ICD-10-CM | POA: Diagnosis not present

## 2014-12-24 DIAGNOSIS — D631 Anemia in chronic kidney disease: Secondary | ICD-10-CM | POA: Diagnosis not present

## 2014-12-24 DIAGNOSIS — Z23 Encounter for immunization: Secondary | ICD-10-CM | POA: Diagnosis not present

## 2014-12-24 DIAGNOSIS — N2581 Secondary hyperparathyroidism of renal origin: Secondary | ICD-10-CM | POA: Diagnosis not present

## 2014-12-26 DIAGNOSIS — N186 End stage renal disease: Secondary | ICD-10-CM | POA: Diagnosis not present

## 2014-12-26 DIAGNOSIS — N2581 Secondary hyperparathyroidism of renal origin: Secondary | ICD-10-CM | POA: Diagnosis not present

## 2014-12-26 DIAGNOSIS — Z23 Encounter for immunization: Secondary | ICD-10-CM | POA: Diagnosis not present

## 2014-12-26 DIAGNOSIS — D631 Anemia in chronic kidney disease: Secondary | ICD-10-CM | POA: Diagnosis not present

## 2014-12-28 DIAGNOSIS — L89214 Pressure ulcer of right hip, stage 4: Secondary | ICD-10-CM | POA: Diagnosis not present

## 2014-12-29 DIAGNOSIS — D631 Anemia in chronic kidney disease: Secondary | ICD-10-CM | POA: Diagnosis not present

## 2014-12-29 DIAGNOSIS — I10 Essential (primary) hypertension: Secondary | ICD-10-CM | POA: Diagnosis not present

## 2014-12-29 DIAGNOSIS — D509 Iron deficiency anemia, unspecified: Secondary | ICD-10-CM | POA: Diagnosis not present

## 2014-12-29 DIAGNOSIS — N186 End stage renal disease: Secondary | ICD-10-CM | POA: Diagnosis not present

## 2014-12-31 DIAGNOSIS — N186 End stage renal disease: Secondary | ICD-10-CM | POA: Diagnosis not present

## 2014-12-31 DIAGNOSIS — D509 Iron deficiency anemia, unspecified: Secondary | ICD-10-CM | POA: Diagnosis not present

## 2014-12-31 DIAGNOSIS — D631 Anemia in chronic kidney disease: Secondary | ICD-10-CM | POA: Diagnosis not present

## 2015-01-02 DIAGNOSIS — D509 Iron deficiency anemia, unspecified: Secondary | ICD-10-CM | POA: Diagnosis not present

## 2015-01-02 DIAGNOSIS — N186 End stage renal disease: Secondary | ICD-10-CM | POA: Diagnosis not present

## 2015-01-02 DIAGNOSIS — D631 Anemia in chronic kidney disease: Secondary | ICD-10-CM | POA: Diagnosis not present

## 2015-01-04 DIAGNOSIS — L89214 Pressure ulcer of right hip, stage 4: Secondary | ICD-10-CM | POA: Diagnosis not present

## 2015-01-05 DIAGNOSIS — N186 End stage renal disease: Secondary | ICD-10-CM | POA: Diagnosis not present

## 2015-01-05 DIAGNOSIS — D509 Iron deficiency anemia, unspecified: Secondary | ICD-10-CM | POA: Diagnosis not present

## 2015-01-05 DIAGNOSIS — D631 Anemia in chronic kidney disease: Secondary | ICD-10-CM | POA: Diagnosis not present

## 2015-01-07 DIAGNOSIS — D509 Iron deficiency anemia, unspecified: Secondary | ICD-10-CM | POA: Diagnosis not present

## 2015-01-07 DIAGNOSIS — H60339 Swimmer's ear, unspecified ear: Secondary | ICD-10-CM | POA: Diagnosis not present

## 2015-01-07 DIAGNOSIS — N186 End stage renal disease: Secondary | ICD-10-CM | POA: Diagnosis not present

## 2015-01-07 DIAGNOSIS — E039 Hypothyroidism, unspecified: Secondary | ICD-10-CM | POA: Diagnosis not present

## 2015-01-07 DIAGNOSIS — Z5189 Encounter for other specified aftercare: Secondary | ICD-10-CM | POA: Diagnosis not present

## 2015-01-07 DIAGNOSIS — M545 Low back pain: Secondary | ICD-10-CM | POA: Diagnosis not present

## 2015-01-07 DIAGNOSIS — D631 Anemia in chronic kidney disease: Secondary | ICD-10-CM | POA: Diagnosis not present

## 2015-01-09 DIAGNOSIS — D631 Anemia in chronic kidney disease: Secondary | ICD-10-CM | POA: Diagnosis not present

## 2015-01-09 DIAGNOSIS — N186 End stage renal disease: Secondary | ICD-10-CM | POA: Diagnosis not present

## 2015-01-09 DIAGNOSIS — D509 Iron deficiency anemia, unspecified: Secondary | ICD-10-CM | POA: Diagnosis not present

## 2015-01-12 DIAGNOSIS — D509 Iron deficiency anemia, unspecified: Secondary | ICD-10-CM | POA: Diagnosis not present

## 2015-01-12 DIAGNOSIS — N186 End stage renal disease: Secondary | ICD-10-CM | POA: Diagnosis not present

## 2015-01-12 DIAGNOSIS — D631 Anemia in chronic kidney disease: Secondary | ICD-10-CM | POA: Diagnosis not present

## 2015-01-14 DIAGNOSIS — N186 End stage renal disease: Secondary | ICD-10-CM | POA: Diagnosis not present

## 2015-01-14 DIAGNOSIS — D509 Iron deficiency anemia, unspecified: Secondary | ICD-10-CM | POA: Diagnosis not present

## 2015-01-14 DIAGNOSIS — D631 Anemia in chronic kidney disease: Secondary | ICD-10-CM | POA: Diagnosis not present

## 2015-01-16 DIAGNOSIS — N186 End stage renal disease: Secondary | ICD-10-CM | POA: Diagnosis not present

## 2015-01-16 DIAGNOSIS — D631 Anemia in chronic kidney disease: Secondary | ICD-10-CM | POA: Diagnosis not present

## 2015-01-16 DIAGNOSIS — D509 Iron deficiency anemia, unspecified: Secondary | ICD-10-CM | POA: Diagnosis not present

## 2015-01-18 DIAGNOSIS — N186 End stage renal disease: Secondary | ICD-10-CM | POA: Diagnosis not present

## 2015-01-18 DIAGNOSIS — D509 Iron deficiency anemia, unspecified: Secondary | ICD-10-CM | POA: Diagnosis not present

## 2015-01-18 DIAGNOSIS — D631 Anemia in chronic kidney disease: Secondary | ICD-10-CM | POA: Diagnosis not present

## 2015-01-19 ENCOUNTER — Encounter: Payer: Self-pay | Admitting: Physician Assistant

## 2015-01-19 ENCOUNTER — Ambulatory Visit (INDEPENDENT_AMBULATORY_CARE_PROVIDER_SITE_OTHER): Payer: Medicare Other | Admitting: Physician Assistant

## 2015-01-19 VITALS — BP 116/68 | HR 91 | Temp 97.8°F | Resp 16 | Ht 71.0 in

## 2015-01-19 DIAGNOSIS — L89312 Pressure ulcer of right buttock, stage 2: Secondary | ICD-10-CM | POA: Diagnosis not present

## 2015-01-19 DIAGNOSIS — Z89512 Acquired absence of left leg below knee: Secondary | ICD-10-CM

## 2015-01-19 MED ORDER — FENTANYL 50 MCG/HR TD PT72: 50.0000 ug | MEDICATED_PATCH | TRANSDERMAL | Status: DC

## 2015-01-19 NOTE — Patient Instructions (Signed)
Please continue medications as directed. I have refilled your Fentanyl.  Please follow-up with Nephrology as scheduled for appointments and Dialysis.  I will be sending home health out for RN assessment, wound care, labs, PT and OT.  I will shoot for [place] since they have taken care of you previously.  Follow-up 1 month.

## 2015-01-19 NOTE — Progress Notes (Signed)
Pre visit review using our clinic review tool, if applicable. No additional management support is needed unless otherwise documented below in the visit note/SLS  

## 2015-01-20 DIAGNOSIS — D509 Iron deficiency anemia, unspecified: Secondary | ICD-10-CM | POA: Diagnosis not present

## 2015-01-20 DIAGNOSIS — D631 Anemia in chronic kidney disease: Secondary | ICD-10-CM | POA: Diagnosis not present

## 2015-01-20 DIAGNOSIS — N186 End stage renal disease: Secondary | ICD-10-CM | POA: Diagnosis not present

## 2015-01-22 DIAGNOSIS — Z89519 Acquired absence of unspecified leg below knee: Secondary | ICD-10-CM | POA: Insufficient documentation

## 2015-01-22 DIAGNOSIS — L89309 Pressure ulcer of unspecified buttock, unspecified stage: Secondary | ICD-10-CM

## 2015-01-22 HISTORY — DX: Acquired absence of unspecified leg below knee: Z89.519

## 2015-01-22 NOTE — Assessment & Plan Note (Signed)
Healing. Order for home RN for wound care and to draw labs placed. Follow-up 1 month.

## 2015-01-22 NOTE — Assessment & Plan Note (Signed)
Order for home health PT/OT and RN care placed. Continue exercises given by PT inpatient.

## 2015-01-22 NOTE — Progress Notes (Signed)
Patient presents to clinic today for follow-up after being released from wound care rehabilitation at [facility]. Patient placed inpatient due to decubitus ulcer of R buttock as well as for physical therapy as patient is s/p left bka and has struggled with ambulating with prosthesis for some time. Patient was discharged home after wound was mostly healed. Was supposed to have home health PT, OT and wound care but states this was never set up while still in the hospital. Is requesting these services now.  Denies new or worsening ulceration. Is still following with Nephrology. Has dialysis three times weekly.  Past Medical History  Diagnosis Date  . Sleep apnea     uses cpap  . Hypothyroidism   . Anemia   . Blood transfusion   . Chronic kidney disease     hx of kidney stones  . Hypertension   . Arthritis     osteoarthritis  . Pneumonia   . Diabetes mellitus     insulin dependent  . Peripheral vascular disease ([place])   . Hyperlipidemia   . Morbid obesity ([place])   . Hypertrophy of prostate   . Acute respiratory failure ([place])   . Cellulitis and abscess of leg   . Chronic kidney disease, stage III (moderate)   . Nephrolithiasis   . Anxiety   . Chronic pain   . Embolism and thrombosis of unspecified artery ([place])   . DVT (deep venous thrombosis) ([facility])     Lower Extremity  . Personal history of arthritis     Osteoarthritis  . History of nephrolithiasis     Bilateral  . AR (allergic rhinitis)   . BPH (benign prostatic hypertrophy)   . Displacement of lumbar intervertebral disc without myelopathy     L4-L5  . Cholelithiasis   . Other lymphedema   . Unspecified septicemia   . Impotence of organic origin   . Pancreatitis, acute   . Diverticulosis of colon   . Gallstone     Current Outpatient Prescriptions on File Prior to Visit  Medication Sig Dispense Refill  . atorvastatin (LIPITOR) 40 MG tablet Take 1 tablet (40 mg total) by mouth daily. 90 tablet 1  . ferrous  sulfate 325 (65 FE) MG tablet Take 1 tablet (325 mg total) by mouth 2 (two) times daily. 180 tablet 1  . gabapentin (NEURONTIN) 100 MG capsule TAKE 1 CAPSULE (100 MG TOTAL) BY MOUTH 3 (THREE) TIMES DAILY. 90 capsule 1  . glucose blood (BAYER CONTOUR TEST) test strip USE AS DIRECTED TO CHECK BLOOD SUGARS TWICE DAILY Dx: E11.9 100 each 3  . insulin aspart (NOVOLOG) 100 UNIT/ML injection INJECT 3 UNITS THREE TIMES DAILY WITH MEALS. AND AT NIGHTIME IF BLOOD SUGARS GREATER THAN 150 DX: E11.29 40 mL 1  . insulin glargine (LANTUS) 100 UNIT/ML injection Inject 0.4 mLs (40 Units total) into the skin at bedtime. 120 mL 1  . Insulin Syringe-Needle U-100 (B-D INS SYRINGE 0.5CC/31GX5/16) 31G X 5/16" 0.5 ML MISC USE 4 TIMES A DAY FOR DIABETES BLOOD GLUCOSE TESTING DX: E11.29 300 each 1  . levothyroxine (SYNTHROID, LEVOTHROID) 200 MCG tablet TAKE 1 TABLET (200 MCG TOTAL) BY MOUTH EVERY MORNING. 90 tablet 1  . levothyroxine (SYNTHROID, LEVOTHROID) 25 MCG tablet TAKE 1 TABLET (25 MCG TOTAL) BY MOUTH DAILY BEFORE BREAKFAST. 90 tablet 1  . Multiple Vitamins-Minerals (MULTIVITAMINS THER. W/MINERALS) TABS Take 1 tablet by mouth daily.     . sevelamer carbonate (RENVELA) 800 MG tablet Take 800 mg by mouth  3 (three) times daily with meals.     No current facility-administered medications on file prior to visit.    No Known Allergies  Family History  Problem Relation Age of Onset  . Diabetes Mother   . Heart attack Mother   . Stroke Mother   . Colon cancer Father   . Dementia Father   . Heart attack Father   . Arthritis Paternal Grandmother   . Hypertension Other   . Hypothyroidism Other     Social History   Social History  . Marital Status: Married    Spouse Name: N/A  . Number of Children: N/A  . Years of Education: N/A   Social History Main Topics  . Smoking status: Never Smoker   . Smokeless tobacco: Never Used  . Alcohol Use: No  . Drug Use: No  . Sexual Activity: Yes   Other Topics Concern    . None   Social History Narrative   Lives with his wife and uses a wheelchair for transfers.      Review of Systems - See HPI.  All other ROS are negative.  BP 116/68 mmHg  Pulse 91  Temp(Src) 97.8 F (36.6 C)  Resp 16  Ht 5\' 11"  (1.803 m)  Wt   SpO2 98%  Physical Exam  Constitutional: He is oriented to person, place, and time and well-developed, well-nourished, and in no distress.  HENT:  Head: Normocephalic and atraumatic.  Eyes: Conjunctivae are normal.  Cardiovascular: Normal rate, regular rhythm, normal heart sounds and intact distal pulses.   Pulmonary/Chest: Effort normal and breath sounds normal. No respiratory distress. He has no wheezes. He has no rales. He exhibits no tenderness.  Neurological: He is alert and oriented to person, place, and time.  Skin: Skin is warm and dry.  Healing stage II ulceration noted without infection.  Psychiatric: Affect normal.  Vitals reviewed.   No results found for this or any previous visit (from the past 2160 hour(s)).  Assessment/Plan: S/P BKA (below knee amputation) unilateral ([place]) Order for home health PT/OT and RN care placed. Continue exercises given by PT inpatient.  Decubitus ulcer of buttock Healing. Order for home RN for wound care and to draw labs placed. Follow-up 1 month.

## 2015-01-23 DIAGNOSIS — D509 Iron deficiency anemia, unspecified: Secondary | ICD-10-CM | POA: Diagnosis not present

## 2015-01-23 DIAGNOSIS — D631 Anemia in chronic kidney disease: Secondary | ICD-10-CM | POA: Diagnosis not present

## 2015-01-23 DIAGNOSIS — N186 End stage renal disease: Secondary | ICD-10-CM | POA: Diagnosis not present

## 2015-01-25 ENCOUNTER — Telehealth: Payer: Self-pay | Admitting: *Deleted

## 2015-01-25 NOTE — Telephone Encounter (Signed)
LMOM with contact name and number [for return call, if needed] RE: [place] with [facility]] and that we are working on setting up patient with new provider and will contact patient when we have that information/SLS

## 2015-01-25 NOTE — Telephone Encounter (Signed)
Home Health  Received: [place], [name]    Phone Number: [phone]        Patient states that he was told by [name] that he could get home health but he has not heard from anyone since his last appointment 11/22. States he is in need of HH. Notes in chart show that he was contacted last week 11/23 but he states that no one has called him. Plse adv.

## 2015-01-25 NOTE — Telephone Encounter (Addendum)
Copy of Phone note & Demographic sheet forwarded to [facility] [Jen] for attempt to get patient in with new Home Health/SLS

## 2015-01-25 NOTE — Telephone Encounter (Signed)
Reviewed referral -- [facility] refusing to take patient on due to complexity of condition and note of "unwilling caregiver".  See if [facility] can set up home care with [facility] instead and inform patient of the reason for delay

## 2015-01-26 DIAGNOSIS — D509 Iron deficiency anemia, unspecified: Secondary | ICD-10-CM | POA: Diagnosis not present

## 2015-01-26 DIAGNOSIS — D631 Anemia in chronic kidney disease: Secondary | ICD-10-CM | POA: Diagnosis not present

## 2015-01-26 DIAGNOSIS — N186 End stage renal disease: Secondary | ICD-10-CM | POA: Diagnosis not present

## 2015-01-26 NOTE — Telephone Encounter (Signed)
LMOM [2nd] with contact name and number [for return call, if needed] RE: [place] with [facility]] and that we are working on setting up patient with new provider and will contact patient when we have that information/SLS

## 2015-01-27 ENCOUNTER — Telehealth: Payer: Self-pay | Admitting: Physician Assistant

## 2015-01-27 DIAGNOSIS — I120 Hypertensive chronic kidney disease with stage 5 chronic kidney disease or end stage renal disease: Secondary | ICD-10-CM | POA: Diagnosis not present

## 2015-01-27 DIAGNOSIS — E1122 Type 2 diabetes mellitus with diabetic chronic kidney disease: Secondary | ICD-10-CM | POA: Diagnosis not present

## 2015-01-27 DIAGNOSIS — M1990 Unspecified osteoarthritis, unspecified site: Secondary | ICD-10-CM | POA: Diagnosis not present

## 2015-01-27 DIAGNOSIS — E1151 Type 2 diabetes mellitus with diabetic peripheral angiopathy without gangrene: Secondary | ICD-10-CM | POA: Diagnosis not present

## 2015-01-27 DIAGNOSIS — L89312 Pressure ulcer of right buttock, stage 2: Secondary | ICD-10-CM | POA: Diagnosis not present

## 2015-01-27 DIAGNOSIS — N186 End stage renal disease: Secondary | ICD-10-CM | POA: Diagnosis not present

## 2015-01-27 NOTE — Telephone Encounter (Signed)
Patient set up with [facility]. Will follow.

## 2015-01-27 NOTE — Telephone Encounter (Signed)
Ok to give verbal order. Please have them fax over all notes.

## 2015-01-27 NOTE — Telephone Encounter (Signed)
Caller name: Olin Hauser from [facility]  Relation to pt: RN Call back number: [phone]    Reason for call: requesting verbal orders patient has a wound on right area of buttocks, requesting to use calcium carbonate and dressing

## 2015-01-27 NOTE — Telephone Encounter (Signed)
Spoke with Olin Hauser. Order given. She will fax everything over. She is seeing Mr. Castellanos twice weekly.

## 2015-01-28 DIAGNOSIS — N186 End stage renal disease: Secondary | ICD-10-CM | POA: Diagnosis not present

## 2015-01-28 DIAGNOSIS — I10 Essential (primary) hypertension: Secondary | ICD-10-CM | POA: Diagnosis not present

## 2015-01-28 DIAGNOSIS — D631 Anemia in chronic kidney disease: Secondary | ICD-10-CM | POA: Diagnosis not present

## 2015-01-28 DIAGNOSIS — D509 Iron deficiency anemia, unspecified: Secondary | ICD-10-CM | POA: Diagnosis not present

## 2015-01-29 ENCOUNTER — Telehealth: Payer: Self-pay | Admitting: Physician Assistant

## 2015-01-29 DIAGNOSIS — L89312 Pressure ulcer of right buttock, stage 2: Secondary | ICD-10-CM | POA: Diagnosis not present

## 2015-01-29 DIAGNOSIS — E1122 Type 2 diabetes mellitus with diabetic chronic kidney disease: Secondary | ICD-10-CM | POA: Diagnosis not present

## 2015-01-29 DIAGNOSIS — I120 Hypertensive chronic kidney disease with stage 5 chronic kidney disease or end stage renal disease: Secondary | ICD-10-CM | POA: Diagnosis not present

## 2015-01-29 DIAGNOSIS — N186 End stage renal disease: Secondary | ICD-10-CM | POA: Diagnosis not present

## 2015-01-29 DIAGNOSIS — M1990 Unspecified osteoarthritis, unspecified site: Secondary | ICD-10-CM | POA: Diagnosis not present

## 2015-01-29 DIAGNOSIS — E1151 Type 2 diabetes mellitus with diabetic peripheral angiopathy without gangrene: Secondary | ICD-10-CM | POA: Diagnosis not present

## 2015-01-29 NOTE — Telephone Encounter (Signed)
Caller name:Colleen-[facility] Relation to [id number] Call back number:[phone] Pharmacy:  Reason for call: a verbal ordered is needed for continuous pt twice a week for  Eight weeks for gait and mobility training. A message can be left on voice mail

## 2015-01-30 DIAGNOSIS — D631 Anemia in chronic kidney disease: Secondary | ICD-10-CM | POA: Diagnosis not present

## 2015-01-30 DIAGNOSIS — N186 End stage renal disease: Secondary | ICD-10-CM | POA: Diagnosis not present

## 2015-01-30 DIAGNOSIS — D509 Iron deficiency anemia, unspecified: Secondary | ICD-10-CM | POA: Diagnosis not present

## 2015-02-01 NOTE — Telephone Encounter (Signed)
Facility called back today 12/5 to follow up on request below.

## 2015-02-01 NOTE — Telephone Encounter (Signed)
Spoke with [employer]. Verbal order granted. They will fax over written orders.

## 2015-02-02 DIAGNOSIS — D509 Iron deficiency anemia, unspecified: Secondary | ICD-10-CM | POA: Diagnosis not present

## 2015-02-02 DIAGNOSIS — D631 Anemia in chronic kidney disease: Secondary | ICD-10-CM | POA: Diagnosis not present

## 2015-02-02 DIAGNOSIS — N186 End stage renal disease: Secondary | ICD-10-CM | POA: Diagnosis not present

## 2015-02-03 DIAGNOSIS — I120 Hypertensive chronic kidney disease with stage 5 chronic kidney disease or end stage renal disease: Secondary | ICD-10-CM | POA: Diagnosis not present

## 2015-02-03 DIAGNOSIS — E1151 Type 2 diabetes mellitus with diabetic peripheral angiopathy without gangrene: Secondary | ICD-10-CM | POA: Diagnosis not present

## 2015-02-03 DIAGNOSIS — M1990 Unspecified osteoarthritis, unspecified site: Secondary | ICD-10-CM | POA: Diagnosis not present

## 2015-02-03 DIAGNOSIS — L89312 Pressure ulcer of right buttock, stage 2: Secondary | ICD-10-CM | POA: Diagnosis not present

## 2015-02-03 DIAGNOSIS — E1122 Type 2 diabetes mellitus with diabetic chronic kidney disease: Secondary | ICD-10-CM | POA: Diagnosis not present

## 2015-02-03 DIAGNOSIS — N186 End stage renal disease: Secondary | ICD-10-CM | POA: Diagnosis not present

## 2015-02-04 DIAGNOSIS — D509 Iron deficiency anemia, unspecified: Secondary | ICD-10-CM | POA: Diagnosis not present

## 2015-02-04 DIAGNOSIS — N186 End stage renal disease: Secondary | ICD-10-CM | POA: Diagnosis not present

## 2015-02-04 DIAGNOSIS — K769 Liver disease, unspecified: Secondary | ICD-10-CM | POA: Diagnosis not present

## 2015-02-04 DIAGNOSIS — E1165 Type 2 diabetes mellitus with hyperglycemia: Secondary | ICD-10-CM | POA: Diagnosis not present

## 2015-02-04 DIAGNOSIS — D631 Anemia in chronic kidney disease: Secondary | ICD-10-CM | POA: Diagnosis not present

## 2015-02-05 ENCOUNTER — Telehealth: Payer: Self-pay | Admitting: Physician Assistant

## 2015-02-05 DIAGNOSIS — M1990 Unspecified osteoarthritis, unspecified site: Secondary | ICD-10-CM | POA: Diagnosis not present

## 2015-02-05 DIAGNOSIS — L89312 Pressure ulcer of right buttock, stage 2: Secondary | ICD-10-CM | POA: Diagnosis not present

## 2015-02-05 DIAGNOSIS — E1122 Type 2 diabetes mellitus with diabetic chronic kidney disease: Secondary | ICD-10-CM | POA: Diagnosis not present

## 2015-02-05 DIAGNOSIS — E1151 Type 2 diabetes mellitus with diabetic peripheral angiopathy without gangrene: Secondary | ICD-10-CM | POA: Diagnosis not present

## 2015-02-05 DIAGNOSIS — I120 Hypertensive chronic kidney disease with stage 5 chronic kidney disease or end stage renal disease: Secondary | ICD-10-CM | POA: Diagnosis not present

## 2015-02-05 DIAGNOSIS — N186 End stage renal disease: Secondary | ICD-10-CM | POA: Diagnosis not present

## 2015-02-05 NOTE — Telephone Encounter (Signed)
Left message on VM for patient to call about Flu Shot

## 2015-02-06 DIAGNOSIS — D631 Anemia in chronic kidney disease: Secondary | ICD-10-CM | POA: Diagnosis not present

## 2015-02-06 DIAGNOSIS — N186 End stage renal disease: Secondary | ICD-10-CM | POA: Diagnosis not present

## 2015-02-06 DIAGNOSIS — D509 Iron deficiency anemia, unspecified: Secondary | ICD-10-CM | POA: Diagnosis not present

## 2015-02-08 ENCOUNTER — Telehealth: Payer: Self-pay | Admitting: *Deleted

## 2015-02-08 DIAGNOSIS — E1151 Type 2 diabetes mellitus with diabetic peripheral angiopathy without gangrene: Secondary | ICD-10-CM | POA: Diagnosis not present

## 2015-02-08 DIAGNOSIS — L89312 Pressure ulcer of right buttock, stage 2: Secondary | ICD-10-CM | POA: Diagnosis not present

## 2015-02-08 DIAGNOSIS — I120 Hypertensive chronic kidney disease with stage 5 chronic kidney disease or end stage renal disease: Secondary | ICD-10-CM | POA: Diagnosis not present

## 2015-02-08 DIAGNOSIS — M1990 Unspecified osteoarthritis, unspecified site: Secondary | ICD-10-CM | POA: Diagnosis not present

## 2015-02-08 DIAGNOSIS — E1122 Type 2 diabetes mellitus with diabetic chronic kidney disease: Secondary | ICD-10-CM | POA: Diagnosis not present

## 2015-02-08 DIAGNOSIS — N186 End stage renal disease: Secondary | ICD-10-CM | POA: Diagnosis not present

## 2015-02-08 NOTE — Telephone Encounter (Signed)
Signed forms faxed to [facility] successfully. Sent for scanning. JG//CMA

## 2015-02-09 DIAGNOSIS — N186 End stage renal disease: Secondary | ICD-10-CM | POA: Diagnosis not present

## 2015-02-09 DIAGNOSIS — D631 Anemia in chronic kidney disease: Secondary | ICD-10-CM | POA: Diagnosis not present

## 2015-02-09 DIAGNOSIS — D509 Iron deficiency anemia, unspecified: Secondary | ICD-10-CM | POA: Diagnosis not present

## 2015-02-10 DIAGNOSIS — L89312 Pressure ulcer of right buttock, stage 2: Secondary | ICD-10-CM | POA: Diagnosis not present

## 2015-02-10 DIAGNOSIS — N186 End stage renal disease: Secondary | ICD-10-CM | POA: Diagnosis not present

## 2015-02-10 DIAGNOSIS — M1990 Unspecified osteoarthritis, unspecified site: Secondary | ICD-10-CM | POA: Diagnosis not present

## 2015-02-10 DIAGNOSIS — E1151 Type 2 diabetes mellitus with diabetic peripheral angiopathy without gangrene: Secondary | ICD-10-CM | POA: Diagnosis not present

## 2015-02-10 DIAGNOSIS — I120 Hypertensive chronic kidney disease with stage 5 chronic kidney disease or end stage renal disease: Secondary | ICD-10-CM | POA: Diagnosis not present

## 2015-02-10 DIAGNOSIS — E1122 Type 2 diabetes mellitus with diabetic chronic kidney disease: Secondary | ICD-10-CM | POA: Diagnosis not present

## 2015-02-11 DIAGNOSIS — D631 Anemia in chronic kidney disease: Secondary | ICD-10-CM | POA: Diagnosis not present

## 2015-02-11 DIAGNOSIS — N186 End stage renal disease: Secondary | ICD-10-CM | POA: Diagnosis not present

## 2015-02-11 DIAGNOSIS — D509 Iron deficiency anemia, unspecified: Secondary | ICD-10-CM | POA: Diagnosis not present

## 2015-02-12 DIAGNOSIS — N186 End stage renal disease: Secondary | ICD-10-CM | POA: Diagnosis not present

## 2015-02-12 DIAGNOSIS — I120 Hypertensive chronic kidney disease with stage 5 chronic kidney disease or end stage renal disease: Secondary | ICD-10-CM | POA: Diagnosis not present

## 2015-02-12 DIAGNOSIS — L89312 Pressure ulcer of right buttock, stage 2: Secondary | ICD-10-CM | POA: Diagnosis not present

## 2015-02-12 DIAGNOSIS — M1990 Unspecified osteoarthritis, unspecified site: Secondary | ICD-10-CM | POA: Diagnosis not present

## 2015-02-12 DIAGNOSIS — E1122 Type 2 diabetes mellitus with diabetic chronic kidney disease: Secondary | ICD-10-CM | POA: Diagnosis not present

## 2015-02-12 DIAGNOSIS — E1151 Type 2 diabetes mellitus with diabetic peripheral angiopathy without gangrene: Secondary | ICD-10-CM | POA: Diagnosis not present

## 2015-02-13 DIAGNOSIS — D509 Iron deficiency anemia, unspecified: Secondary | ICD-10-CM | POA: Diagnosis not present

## 2015-02-13 DIAGNOSIS — N186 End stage renal disease: Secondary | ICD-10-CM | POA: Diagnosis not present

## 2015-02-13 DIAGNOSIS — D631 Anemia in chronic kidney disease: Secondary | ICD-10-CM | POA: Diagnosis not present

## 2015-02-15 ENCOUNTER — Telehealth: Payer: Self-pay | Admitting: Physician Assistant

## 2015-02-15 DIAGNOSIS — I120 Hypertensive chronic kidney disease with stage 5 chronic kidney disease or end stage renal disease: Secondary | ICD-10-CM | POA: Diagnosis not present

## 2015-02-15 DIAGNOSIS — M1990 Unspecified osteoarthritis, unspecified site: Secondary | ICD-10-CM | POA: Diagnosis not present

## 2015-02-15 DIAGNOSIS — N186 End stage renal disease: Secondary | ICD-10-CM | POA: Diagnosis not present

## 2015-02-15 DIAGNOSIS — E1151 Type 2 diabetes mellitus with diabetic peripheral angiopathy without gangrene: Secondary | ICD-10-CM | POA: Diagnosis not present

## 2015-02-15 DIAGNOSIS — L89312 Pressure ulcer of right buttock, stage 2: Secondary | ICD-10-CM | POA: Diagnosis not present

## 2015-02-15 DIAGNOSIS — E1122 Type 2 diabetes mellitus with diabetic chronic kidney disease: Secondary | ICD-10-CM | POA: Diagnosis not present

## 2015-02-15 NOTE — Telephone Encounter (Signed)
Noted  

## 2015-02-15 NOTE — Telephone Encounter (Signed)
Caller name: Roxanne  Relation to pt: [facility]  Call back number: [phone]    Reason for call:  Patient was scheduled to be seen by LPN from [facility] [id number] and patient was not seen. LPN states protocol to advise PCP but patient has been seen since then.

## 2015-02-16 ENCOUNTER — Telehealth: Payer: Self-pay | Admitting: Physician Assistant

## 2015-02-16 DIAGNOSIS — D631 Anemia in chronic kidney disease: Secondary | ICD-10-CM | POA: Diagnosis not present

## 2015-02-16 DIAGNOSIS — D509 Iron deficiency anemia, unspecified: Secondary | ICD-10-CM | POA: Diagnosis not present

## 2015-02-16 DIAGNOSIS — N186 End stage renal disease: Secondary | ICD-10-CM | POA: Diagnosis not present

## 2015-02-16 NOTE — Telephone Encounter (Signed)
Ok to [employer] orders.

## 2015-02-16 NOTE — Telephone Encounter (Signed)
Please advise.//AB/CMA 

## 2015-02-16 NOTE — Telephone Encounter (Signed)
Called and spoke with [place] with [facility] and granted her verbal orders for OT for the pt-per [place].//AB/CMA

## 2015-02-16 NOTE — Telephone Encounter (Signed)
Caller name: Corie Chiquito  Relationship to patient: [facility]  Can be reached: [phone]   Reason for call: Requesting orders for Home Health OT.

## 2015-02-17 DIAGNOSIS — I120 Hypertensive chronic kidney disease with stage 5 chronic kidney disease or end stage renal disease: Secondary | ICD-10-CM | POA: Diagnosis not present

## 2015-02-17 DIAGNOSIS — N186 End stage renal disease: Secondary | ICD-10-CM | POA: Diagnosis not present

## 2015-02-17 DIAGNOSIS — L89312 Pressure ulcer of right buttock, stage 2: Secondary | ICD-10-CM | POA: Diagnosis not present

## 2015-02-17 DIAGNOSIS — M1990 Unspecified osteoarthritis, unspecified site: Secondary | ICD-10-CM | POA: Diagnosis not present

## 2015-02-17 DIAGNOSIS — E1151 Type 2 diabetes mellitus with diabetic peripheral angiopathy without gangrene: Secondary | ICD-10-CM | POA: Diagnosis not present

## 2015-02-17 DIAGNOSIS — E1122 Type 2 diabetes mellitus with diabetic chronic kidney disease: Secondary | ICD-10-CM | POA: Diagnosis not present

## 2015-02-18 DIAGNOSIS — D509 Iron deficiency anemia, unspecified: Secondary | ICD-10-CM | POA: Diagnosis not present

## 2015-02-18 DIAGNOSIS — N186 End stage renal disease: Secondary | ICD-10-CM | POA: Diagnosis not present

## 2015-02-18 DIAGNOSIS — D631 Anemia in chronic kidney disease: Secondary | ICD-10-CM | POA: Diagnosis not present

## 2015-02-19 ENCOUNTER — Ambulatory Visit (INDEPENDENT_AMBULATORY_CARE_PROVIDER_SITE_OTHER): Payer: Medicare Other | Admitting: Physician Assistant

## 2015-02-19 ENCOUNTER — Encounter: Payer: Self-pay | Admitting: Physician Assistant

## 2015-02-19 ENCOUNTER — Other Ambulatory Visit: Payer: Self-pay | Admitting: Physician Assistant

## 2015-02-19 ENCOUNTER — Ambulatory Visit: Payer: Medicare Other | Admitting: Physician Assistant

## 2015-02-19 VITALS — BP 107/56 | HR 86 | Temp 97.9°F

## 2015-02-19 DIAGNOSIS — N183 Chronic kidney disease, stage 3 unspecified: Secondary | ICD-10-CM

## 2015-02-19 DIAGNOSIS — E1140 Type 2 diabetes mellitus with diabetic neuropathy, unspecified: Secondary | ICD-10-CM

## 2015-02-19 DIAGNOSIS — H60392 Other infective otitis externa, left ear: Secondary | ICD-10-CM | POA: Diagnosis not present

## 2015-02-19 DIAGNOSIS — G8929 Other chronic pain: Secondary | ICD-10-CM | POA: Diagnosis not present

## 2015-02-19 DIAGNOSIS — Z794 Long term (current) use of insulin: Secondary | ICD-10-CM

## 2015-02-19 DIAGNOSIS — H60399 Other infective otitis externa, unspecified ear: Secondary | ICD-10-CM | POA: Insufficient documentation

## 2015-02-19 MED ORDER — GABAPENTIN 100 MG PO CAPS: ORAL_CAPSULE | ORAL | Status: DC

## 2015-02-19 MED ORDER — OFLOXACIN 0.3 % OT SOLN: 10.0000 [drp] | Freq: Every day | OTIC | Status: DC

## 2015-02-19 MED ORDER — FENTANYL 50 MCG/HR TD PT72: 50.0000 ug | MEDICATED_PATCH | TRANSDERMAL | Status: DC

## 2015-02-19 MED ORDER — LEVOTHYROXINE SODIUM 25 MCG PO TABS: ORAL_TABLET | ORAL | Status: DC

## 2015-02-19 MED ORDER — ATORVASTATIN CALCIUM 40 MG PO TABS: 40.0000 mg | ORAL_TABLET | Freq: Every day | ORAL | Status: DC

## 2015-02-19 NOTE — Patient Instructions (Signed)
Please continue medications as directed. The nurse with [name] will be drawing labs on you in the next week.  I will call you with your results.  Please use the antibiotic ear drop as directed for infection. Follow-up if not resolving.  Otherwise follow-up in 3 months.

## 2015-02-19 NOTE — Assessment & Plan Note (Signed)
Rx Ofloxacin otic solution. Use as directed. Supportive measures reviewed. Follow-up if not resolving.

## 2015-02-19 NOTE — Progress Notes (Signed)
Pre visit review using our clinic review tool, if applicable. No additional management support is needed unless otherwise documented below in the visit note. 

## 2015-02-19 NOTE — Progress Notes (Signed)
Patient presents to clinic today c/o pain and swelling of left ear x 1 week with decreased hearing. Denies drainage. Denies trauma or injury. Denies symptoms of R ear.  Patient also following up regarding missed appointments with PT. States he has been home and not sure why they are missing him. Has only had one week of PT due to missed appointments. Is wanting to work on strength. Notes OT has not been out to house yet. Is requesting a follow-up on this.  Past Medical History  Diagnosis Date  . Sleep apnea     uses cpap  . Hypothyroidism   . Anemia   . Blood transfusion   . Chronic kidney disease     hx of kidney stones  . Hypertension   . Arthritis     osteoarthritis  . Pneumonia   . Diabetes mellitus     insulin dependent  . Peripheral vascular disease ([place])   . Hyperlipidemia   . Morbid obesity ([place])   . Hypertrophy of prostate   . Acute respiratory failure ([place])   . Cellulitis and abscess of leg   . Chronic kidney disease, stage III (moderate)   . Nephrolithiasis   . Anxiety   . Chronic pain   . Embolism and thrombosis of unspecified artery ([place])   . DVT (deep venous thrombosis) ([facility])     Lower Extremity  . Personal history of arthritis     Osteoarthritis  . History of nephrolithiasis     Bilateral  . AR (allergic rhinitis)   . BPH (benign prostatic hypertrophy)   . Displacement of lumbar intervertebral disc without myelopathy     L4-L5  . Cholelithiasis   . Other lymphedema   . Unspecified septicemia   . Impotence of organic origin   . Pancreatitis, acute   . Diverticulosis of colon   . Gallstone     Current Outpatient Prescriptions on File Prior to Visit  Medication Sig Dispense Refill  . ferrous sulfate 325 (65 FE) MG tablet Take 1 tablet (325 mg total) by mouth 2 (two) times daily. 180 tablet 1  . glucose blood (BAYER CONTOUR TEST) test strip USE AS DIRECTED TO CHECK BLOOD SUGARS TWICE DAILY Dx: E11.9 100 each 3  . insulin aspart (NOVOLOG) 100  UNIT/ML injection INJECT 3 UNITS THREE TIMES DAILY WITH MEALS. AND AT NIGHTIME IF BLOOD SUGARS GREATER THAN 150 DX: E11.29 40 mL 1  . insulin glargine (LANTUS) 100 UNIT/ML injection Inject 0.4 mLs (40 Units total) into the skin at bedtime. 120 mL 1  . Insulin Syringe-Needle U-100 (B-D INS SYRINGE 0.5CC/31GX5/16) 31G X 5/16" 0.5 ML MISC USE 4 TIMES A DAY FOR DIABETES BLOOD GLUCOSE TESTING DX: E11.29 300 each 1  . levothyroxine (SYNTHROID, LEVOTHROID) 200 MCG tablet TAKE 1 TABLET (200 MCG TOTAL) BY MOUTH EVERY MORNING. 90 tablet 1  . Multiple Vitamins-Minerals (MULTIVITAMINS THER. W/MINERALS) TABS Take 1 tablet by mouth daily.     . sevelamer carbonate (RENVELA) 800 MG tablet Take 800 mg by mouth 3 (three) times daily with meals.     No current facility-administered medications on file prior to visit.    No Known Allergies  Family History  Problem Relation Age of Onset  . Diabetes Mother   . Heart attack Mother   . Stroke Mother   . Colon cancer Father   . Dementia Father   . Heart attack Father   . Arthritis Paternal Grandmother   . Hypertension Other   . Hypothyroidism  Other     Social History   Social History  . Marital Status: Married    Spouse Name: N/A  . Number of Children: N/A  . Years of Education: N/A   Social History Main Topics  . Smoking status: Never Smoker   . Smokeless tobacco: Never Used  . Alcohol Use: No  . Drug Use: No  . Sexual Activity: Yes   Other Topics Concern  . None   Social History Narrative   Lives with his wife and uses a wheelchair for transfers.     Review of Systems - See HPI.  All other ROS are negative.  BP 107/56 mmHg  Pulse 86  Temp(Src) 97.9 F (36.6 C) (Oral)  Ht   Wt   SpO2 99%  Physical Exam  Constitutional: He is oriented to person, place, and time and well-developed, well-nourished, and in no distress.  HENT:  Head: Normocephalic and atraumatic.  Right Ear: Tympanic membrane normal.  Left Ear: There is swelling and  tenderness. Tympanic membrane is not erythematous and not bulging.  Eyes: Conjunctivae are normal.  Cardiovascular: Normal rate, regular rhythm, normal heart sounds and intact distal pulses.   Neurological: He is alert and oriented to person, place, and time.  Skin: Skin is warm and dry. No rash noted.  Psychiatric: Affect normal.  Vitals reviewed.   No results found for this or any previous visit (from the past 2160 hour(s)).  Assessment/Plan: Otitis, externa, infective Rx Ofloxacin otic solution. Use as directed. Supportive measures reviewed. Follow-up if not resolving.  Diabetes mellitus type II, controlled Will send order to [facility] for cbc, bmp, a1c as patient will not allow blood draw here. Continue current regimen. Will alter based on results.

## 2015-02-19 NOTE — Assessment & Plan Note (Signed)
Will send order to [facility] for cbc, bmp, a1c as patient will not allow blood draw here. Continue current regimen. Will alter based on results.

## 2015-02-20 DIAGNOSIS — N186 End stage renal disease: Secondary | ICD-10-CM | POA: Diagnosis not present

## 2015-02-20 DIAGNOSIS — D631 Anemia in chronic kidney disease: Secondary | ICD-10-CM | POA: Diagnosis not present

## 2015-02-20 DIAGNOSIS — D509 Iron deficiency anemia, unspecified: Secondary | ICD-10-CM | POA: Diagnosis not present

## 2015-02-23 DIAGNOSIS — D509 Iron deficiency anemia, unspecified: Secondary | ICD-10-CM | POA: Diagnosis not present

## 2015-02-23 DIAGNOSIS — N186 End stage renal disease: Secondary | ICD-10-CM | POA: Diagnosis not present

## 2015-02-23 DIAGNOSIS — D631 Anemia in chronic kidney disease: Secondary | ICD-10-CM | POA: Diagnosis not present

## 2015-02-24 DIAGNOSIS — L89312 Pressure ulcer of right buttock, stage 2: Secondary | ICD-10-CM | POA: Diagnosis not present

## 2015-02-24 DIAGNOSIS — E1122 Type 2 diabetes mellitus with diabetic chronic kidney disease: Secondary | ICD-10-CM | POA: Diagnosis not present

## 2015-02-24 DIAGNOSIS — E1151 Type 2 diabetes mellitus with diabetic peripheral angiopathy without gangrene: Secondary | ICD-10-CM | POA: Diagnosis not present

## 2015-02-24 DIAGNOSIS — I120 Hypertensive chronic kidney disease with stage 5 chronic kidney disease or end stage renal disease: Secondary | ICD-10-CM | POA: Diagnosis not present

## 2015-02-24 DIAGNOSIS — N186 End stage renal disease: Secondary | ICD-10-CM | POA: Diagnosis not present

## 2015-02-24 DIAGNOSIS — M1990 Unspecified osteoarthritis, unspecified site: Secondary | ICD-10-CM | POA: Diagnosis not present

## 2015-02-25 DIAGNOSIS — N186 End stage renal disease: Secondary | ICD-10-CM | POA: Diagnosis not present

## 2015-02-25 DIAGNOSIS — D509 Iron deficiency anemia, unspecified: Secondary | ICD-10-CM | POA: Diagnosis not present

## 2015-02-25 DIAGNOSIS — D631 Anemia in chronic kidney disease: Secondary | ICD-10-CM | POA: Diagnosis not present

## 2015-02-26 ENCOUNTER — Telehealth: Payer: Self-pay | Admitting: *Deleted

## 2015-02-26 DIAGNOSIS — L89312 Pressure ulcer of right buttock, stage 2: Secondary | ICD-10-CM | POA: Diagnosis not present

## 2015-02-26 DIAGNOSIS — E1122 Type 2 diabetes mellitus with diabetic chronic kidney disease: Secondary | ICD-10-CM | POA: Diagnosis not present

## 2015-02-26 DIAGNOSIS — I120 Hypertensive chronic kidney disease with stage 5 chronic kidney disease or end stage renal disease: Secondary | ICD-10-CM | POA: Diagnosis not present

## 2015-02-26 DIAGNOSIS — M1990 Unspecified osteoarthritis, unspecified site: Secondary | ICD-10-CM | POA: Diagnosis not present

## 2015-02-26 DIAGNOSIS — N186 End stage renal disease: Secondary | ICD-10-CM | POA: Diagnosis not present

## 2015-02-26 DIAGNOSIS — E1151 Type 2 diabetes mellitus with diabetic peripheral angiopathy without gangrene: Secondary | ICD-10-CM | POA: Diagnosis not present

## 2015-02-26 NOTE — Telephone Encounter (Signed)
Received Missed visit Note from [facility], forwarded to provider/SLS

## 2015-02-27 DIAGNOSIS — D509 Iron deficiency anemia, unspecified: Secondary | ICD-10-CM | POA: Diagnosis not present

## 2015-02-27 DIAGNOSIS — D631 Anemia in chronic kidney disease: Secondary | ICD-10-CM | POA: Diagnosis not present

## 2015-02-27 DIAGNOSIS — N186 End stage renal disease: Secondary | ICD-10-CM | POA: Diagnosis not present

## 2015-02-28 DIAGNOSIS — N186 End stage renal disease: Secondary | ICD-10-CM | POA: Diagnosis not present

## 2015-02-28 DIAGNOSIS — D631 Anemia in chronic kidney disease: Secondary | ICD-10-CM | POA: Diagnosis not present

## 2015-02-28 DIAGNOSIS — I10 Essential (primary) hypertension: Secondary | ICD-10-CM | POA: Diagnosis not present

## 2015-03-01 DIAGNOSIS — L89312 Pressure ulcer of right buttock, stage 2: Secondary | ICD-10-CM | POA: Diagnosis not present

## 2015-03-01 DIAGNOSIS — E1122 Type 2 diabetes mellitus with diabetic chronic kidney disease: Secondary | ICD-10-CM | POA: Diagnosis not present

## 2015-03-01 DIAGNOSIS — N186 End stage renal disease: Secondary | ICD-10-CM | POA: Diagnosis not present

## 2015-03-01 DIAGNOSIS — I120 Hypertensive chronic kidney disease with stage 5 chronic kidney disease or end stage renal disease: Secondary | ICD-10-CM | POA: Diagnosis not present

## 2015-03-01 DIAGNOSIS — M1990 Unspecified osteoarthritis, unspecified site: Secondary | ICD-10-CM | POA: Diagnosis not present

## 2015-03-01 DIAGNOSIS — E1151 Type 2 diabetes mellitus with diabetic peripheral angiopathy without gangrene: Secondary | ICD-10-CM | POA: Diagnosis not present

## 2015-03-02 DIAGNOSIS — Z23 Encounter for immunization: Secondary | ICD-10-CM | POA: Diagnosis not present

## 2015-03-02 DIAGNOSIS — D631 Anemia in chronic kidney disease: Secondary | ICD-10-CM | POA: Diagnosis not present

## 2015-03-02 DIAGNOSIS — D509 Iron deficiency anemia, unspecified: Secondary | ICD-10-CM | POA: Diagnosis not present

## 2015-03-02 DIAGNOSIS — N186 End stage renal disease: Secondary | ICD-10-CM | POA: Diagnosis not present

## 2015-03-02 DIAGNOSIS — N2581 Secondary hyperparathyroidism of renal origin: Secondary | ICD-10-CM | POA: Diagnosis not present

## 2015-03-03 DIAGNOSIS — L89312 Pressure ulcer of right buttock, stage 2: Secondary | ICD-10-CM | POA: Diagnosis not present

## 2015-03-03 DIAGNOSIS — M47814 Spondylosis without myelopathy or radiculopathy, thoracic region: Secondary | ICD-10-CM | POA: Diagnosis not present

## 2015-03-03 DIAGNOSIS — N186 End stage renal disease: Secondary | ICD-10-CM | POA: Diagnosis not present

## 2015-03-03 DIAGNOSIS — I120 Hypertensive chronic kidney disease with stage 5 chronic kidney disease or end stage renal disease: Secondary | ICD-10-CM | POA: Diagnosis not present

## 2015-03-03 DIAGNOSIS — R0602 Shortness of breath: Secondary | ICD-10-CM | POA: Diagnosis not present

## 2015-03-03 DIAGNOSIS — R079 Chest pain, unspecified: Secondary | ICD-10-CM | POA: Diagnosis not present

## 2015-03-03 DIAGNOSIS — M1990 Unspecified osteoarthritis, unspecified site: Secondary | ICD-10-CM | POA: Diagnosis not present

## 2015-03-03 DIAGNOSIS — E1151 Type 2 diabetes mellitus with diabetic peripheral angiopathy without gangrene: Secondary | ICD-10-CM | POA: Diagnosis not present

## 2015-03-03 DIAGNOSIS — E1122 Type 2 diabetes mellitus with diabetic chronic kidney disease: Secondary | ICD-10-CM | POA: Diagnosis not present

## 2015-03-04 DIAGNOSIS — D631 Anemia in chronic kidney disease: Secondary | ICD-10-CM | POA: Diagnosis not present

## 2015-03-04 DIAGNOSIS — N2581 Secondary hyperparathyroidism of renal origin: Secondary | ICD-10-CM | POA: Diagnosis not present

## 2015-03-04 DIAGNOSIS — Z1159 Encounter for screening for other viral diseases: Secondary | ICD-10-CM | POA: Diagnosis not present

## 2015-03-04 DIAGNOSIS — E1165 Type 2 diabetes mellitus with hyperglycemia: Secondary | ICD-10-CM | POA: Diagnosis not present

## 2015-03-04 DIAGNOSIS — N186 End stage renal disease: Secondary | ICD-10-CM | POA: Diagnosis not present

## 2015-03-04 DIAGNOSIS — Z23 Encounter for immunization: Secondary | ICD-10-CM | POA: Diagnosis not present

## 2015-03-04 DIAGNOSIS — K769 Liver disease, unspecified: Secondary | ICD-10-CM | POA: Diagnosis not present

## 2015-03-04 DIAGNOSIS — D509 Iron deficiency anemia, unspecified: Secondary | ICD-10-CM | POA: Diagnosis not present

## 2015-03-05 DIAGNOSIS — N186 End stage renal disease: Secondary | ICD-10-CM | POA: Diagnosis not present

## 2015-03-05 DIAGNOSIS — E1122 Type 2 diabetes mellitus with diabetic chronic kidney disease: Secondary | ICD-10-CM | POA: Diagnosis not present

## 2015-03-05 DIAGNOSIS — L89312 Pressure ulcer of right buttock, stage 2: Secondary | ICD-10-CM | POA: Diagnosis not present

## 2015-03-05 DIAGNOSIS — I120 Hypertensive chronic kidney disease with stage 5 chronic kidney disease or end stage renal disease: Secondary | ICD-10-CM | POA: Diagnosis not present

## 2015-03-05 DIAGNOSIS — E1151 Type 2 diabetes mellitus with diabetic peripheral angiopathy without gangrene: Secondary | ICD-10-CM | POA: Diagnosis not present

## 2015-03-05 DIAGNOSIS — M1990 Unspecified osteoarthritis, unspecified site: Secondary | ICD-10-CM | POA: Diagnosis not present

## 2015-03-06 DIAGNOSIS — Z23 Encounter for immunization: Secondary | ICD-10-CM | POA: Diagnosis not present

## 2015-03-06 DIAGNOSIS — D631 Anemia in chronic kidney disease: Secondary | ICD-10-CM | POA: Diagnosis not present

## 2015-03-06 DIAGNOSIS — D509 Iron deficiency anemia, unspecified: Secondary | ICD-10-CM | POA: Diagnosis not present

## 2015-03-06 DIAGNOSIS — N186 End stage renal disease: Secondary | ICD-10-CM | POA: Diagnosis not present

## 2015-03-06 DIAGNOSIS — N2581 Secondary hyperparathyroidism of renal origin: Secondary | ICD-10-CM | POA: Diagnosis not present

## 2015-03-09 DIAGNOSIS — N186 End stage renal disease: Secondary | ICD-10-CM | POA: Diagnosis not present

## 2015-03-09 DIAGNOSIS — D509 Iron deficiency anemia, unspecified: Secondary | ICD-10-CM | POA: Diagnosis not present

## 2015-03-09 DIAGNOSIS — Z23 Encounter for immunization: Secondary | ICD-10-CM | POA: Diagnosis not present

## 2015-03-09 DIAGNOSIS — N2581 Secondary hyperparathyroidism of renal origin: Secondary | ICD-10-CM | POA: Diagnosis not present

## 2015-03-09 DIAGNOSIS — D631 Anemia in chronic kidney disease: Secondary | ICD-10-CM | POA: Diagnosis not present

## 2015-03-10 DIAGNOSIS — E1151 Type 2 diabetes mellitus with diabetic peripheral angiopathy without gangrene: Secondary | ICD-10-CM | POA: Diagnosis not present

## 2015-03-10 DIAGNOSIS — L89312 Pressure ulcer of right buttock, stage 2: Secondary | ICD-10-CM | POA: Diagnosis not present

## 2015-03-10 DIAGNOSIS — N186 End stage renal disease: Secondary | ICD-10-CM | POA: Diagnosis not present

## 2015-03-10 DIAGNOSIS — E1122 Type 2 diabetes mellitus with diabetic chronic kidney disease: Secondary | ICD-10-CM | POA: Diagnosis not present

## 2015-03-10 DIAGNOSIS — M1990 Unspecified osteoarthritis, unspecified site: Secondary | ICD-10-CM | POA: Diagnosis not present

## 2015-03-10 DIAGNOSIS — I120 Hypertensive chronic kidney disease with stage 5 chronic kidney disease or end stage renal disease: Secondary | ICD-10-CM | POA: Diagnosis not present

## 2015-03-11 DIAGNOSIS — Z23 Encounter for immunization: Secondary | ICD-10-CM | POA: Diagnosis not present

## 2015-03-11 DIAGNOSIS — N2581 Secondary hyperparathyroidism of renal origin: Secondary | ICD-10-CM | POA: Diagnosis not present

## 2015-03-11 DIAGNOSIS — D631 Anemia in chronic kidney disease: Secondary | ICD-10-CM | POA: Diagnosis not present

## 2015-03-11 DIAGNOSIS — N186 End stage renal disease: Secondary | ICD-10-CM | POA: Diagnosis not present

## 2015-03-11 DIAGNOSIS — D509 Iron deficiency anemia, unspecified: Secondary | ICD-10-CM | POA: Diagnosis not present

## 2015-03-12 DIAGNOSIS — N186 End stage renal disease: Secondary | ICD-10-CM | POA: Diagnosis not present

## 2015-03-12 DIAGNOSIS — E1151 Type 2 diabetes mellitus with diabetic peripheral angiopathy without gangrene: Secondary | ICD-10-CM | POA: Diagnosis not present

## 2015-03-12 DIAGNOSIS — M1990 Unspecified osteoarthritis, unspecified site: Secondary | ICD-10-CM | POA: Diagnosis not present

## 2015-03-12 DIAGNOSIS — L89312 Pressure ulcer of right buttock, stage 2: Secondary | ICD-10-CM | POA: Diagnosis not present

## 2015-03-12 DIAGNOSIS — I120 Hypertensive chronic kidney disease with stage 5 chronic kidney disease or end stage renal disease: Secondary | ICD-10-CM | POA: Diagnosis not present

## 2015-03-12 DIAGNOSIS — E1122 Type 2 diabetes mellitus with diabetic chronic kidney disease: Secondary | ICD-10-CM | POA: Diagnosis not present

## 2015-03-12 NOTE — Telephone Encounter (Signed)
Received Missed Visit Note from [facility]; forwarded to provider/SLS 01/13

## 2015-03-15 DIAGNOSIS — Z23 Encounter for immunization: Secondary | ICD-10-CM | POA: Diagnosis not present

## 2015-03-15 DIAGNOSIS — N2581 Secondary hyperparathyroidism of renal origin: Secondary | ICD-10-CM | POA: Diagnosis not present

## 2015-03-15 DIAGNOSIS — N186 End stage renal disease: Secondary | ICD-10-CM | POA: Diagnosis not present

## 2015-03-15 DIAGNOSIS — D631 Anemia in chronic kidney disease: Secondary | ICD-10-CM | POA: Diagnosis not present

## 2015-03-15 DIAGNOSIS — D509 Iron deficiency anemia, unspecified: Secondary | ICD-10-CM | POA: Diagnosis not present

## 2015-03-17 DIAGNOSIS — M1990 Unspecified osteoarthritis, unspecified site: Secondary | ICD-10-CM | POA: Diagnosis not present

## 2015-03-17 DIAGNOSIS — E1151 Type 2 diabetes mellitus with diabetic peripheral angiopathy without gangrene: Secondary | ICD-10-CM | POA: Diagnosis not present

## 2015-03-17 DIAGNOSIS — I120 Hypertensive chronic kidney disease with stage 5 chronic kidney disease or end stage renal disease: Secondary | ICD-10-CM | POA: Diagnosis not present

## 2015-03-17 DIAGNOSIS — N186 End stage renal disease: Secondary | ICD-10-CM | POA: Diagnosis not present

## 2015-03-17 DIAGNOSIS — E1122 Type 2 diabetes mellitus with diabetic chronic kidney disease: Secondary | ICD-10-CM | POA: Diagnosis not present

## 2015-03-17 DIAGNOSIS — L89312 Pressure ulcer of right buttock, stage 2: Secondary | ICD-10-CM | POA: Diagnosis not present

## 2015-03-18 DIAGNOSIS — D631 Anemia in chronic kidney disease: Secondary | ICD-10-CM | POA: Diagnosis not present

## 2015-03-18 DIAGNOSIS — N186 End stage renal disease: Secondary | ICD-10-CM | POA: Diagnosis not present

## 2015-03-18 DIAGNOSIS — D509 Iron deficiency anemia, unspecified: Secondary | ICD-10-CM | POA: Diagnosis not present

## 2015-03-18 DIAGNOSIS — N2581 Secondary hyperparathyroidism of renal origin: Secondary | ICD-10-CM | POA: Diagnosis not present

## 2015-03-18 DIAGNOSIS — Z23 Encounter for immunization: Secondary | ICD-10-CM | POA: Diagnosis not present

## 2015-03-19 DIAGNOSIS — I120 Hypertensive chronic kidney disease with stage 5 chronic kidney disease or end stage renal disease: Secondary | ICD-10-CM | POA: Diagnosis not present

## 2015-03-19 DIAGNOSIS — E1122 Type 2 diabetes mellitus with diabetic chronic kidney disease: Secondary | ICD-10-CM | POA: Diagnosis not present

## 2015-03-19 DIAGNOSIS — E1151 Type 2 diabetes mellitus with diabetic peripheral angiopathy without gangrene: Secondary | ICD-10-CM | POA: Diagnosis not present

## 2015-03-19 DIAGNOSIS — M1990 Unspecified osteoarthritis, unspecified site: Secondary | ICD-10-CM | POA: Diagnosis not present

## 2015-03-19 DIAGNOSIS — L89312 Pressure ulcer of right buttock, stage 2: Secondary | ICD-10-CM | POA: Diagnosis not present

## 2015-03-19 DIAGNOSIS — N186 End stage renal disease: Secondary | ICD-10-CM | POA: Diagnosis not present

## 2015-03-20 DIAGNOSIS — N2581 Secondary hyperparathyroidism of renal origin: Secondary | ICD-10-CM | POA: Diagnosis not present

## 2015-03-20 DIAGNOSIS — D509 Iron deficiency anemia, unspecified: Secondary | ICD-10-CM | POA: Diagnosis not present

## 2015-03-20 DIAGNOSIS — N186 End stage renal disease: Secondary | ICD-10-CM | POA: Diagnosis not present

## 2015-03-20 DIAGNOSIS — D631 Anemia in chronic kidney disease: Secondary | ICD-10-CM | POA: Diagnosis not present

## 2015-03-20 DIAGNOSIS — Z23 Encounter for immunization: Secondary | ICD-10-CM | POA: Diagnosis not present

## 2015-03-22 DIAGNOSIS — I120 Hypertensive chronic kidney disease with stage 5 chronic kidney disease or end stage renal disease: Secondary | ICD-10-CM | POA: Diagnosis not present

## 2015-03-22 DIAGNOSIS — N186 End stage renal disease: Secondary | ICD-10-CM | POA: Diagnosis not present

## 2015-03-22 DIAGNOSIS — M1990 Unspecified osteoarthritis, unspecified site: Secondary | ICD-10-CM | POA: Diagnosis not present

## 2015-03-22 DIAGNOSIS — E1122 Type 2 diabetes mellitus with diabetic chronic kidney disease: Secondary | ICD-10-CM | POA: Diagnosis not present

## 2015-03-22 DIAGNOSIS — L89312 Pressure ulcer of right buttock, stage 2: Secondary | ICD-10-CM | POA: Diagnosis not present

## 2015-03-22 DIAGNOSIS — E1151 Type 2 diabetes mellitus with diabetic peripheral angiopathy without gangrene: Secondary | ICD-10-CM | POA: Diagnosis not present

## 2015-03-23 DIAGNOSIS — D509 Iron deficiency anemia, unspecified: Secondary | ICD-10-CM | POA: Diagnosis not present

## 2015-03-23 DIAGNOSIS — D631 Anemia in chronic kidney disease: Secondary | ICD-10-CM | POA: Diagnosis not present

## 2015-03-23 DIAGNOSIS — Z23 Encounter for immunization: Secondary | ICD-10-CM | POA: Diagnosis not present

## 2015-03-23 DIAGNOSIS — N186 End stage renal disease: Secondary | ICD-10-CM | POA: Diagnosis not present

## 2015-03-23 DIAGNOSIS — N2581 Secondary hyperparathyroidism of renal origin: Secondary | ICD-10-CM | POA: Diagnosis not present

## 2015-03-24 ENCOUNTER — Telehealth: Payer: Self-pay | Admitting: *Deleted

## 2015-03-24 DIAGNOSIS — N186 End stage renal disease: Secondary | ICD-10-CM | POA: Diagnosis not present

## 2015-03-24 DIAGNOSIS — M1990 Unspecified osteoarthritis, unspecified site: Secondary | ICD-10-CM | POA: Diagnosis not present

## 2015-03-24 DIAGNOSIS — I120 Hypertensive chronic kidney disease with stage 5 chronic kidney disease or end stage renal disease: Secondary | ICD-10-CM | POA: Diagnosis not present

## 2015-03-24 DIAGNOSIS — E1151 Type 2 diabetes mellitus with diabetic peripheral angiopathy without gangrene: Secondary | ICD-10-CM | POA: Diagnosis not present

## 2015-03-24 DIAGNOSIS — E1122 Type 2 diabetes mellitus with diabetic chronic kidney disease: Secondary | ICD-10-CM | POA: Diagnosis not present

## 2015-03-24 DIAGNOSIS — L89312 Pressure ulcer of right buttock, stage 2: Secondary | ICD-10-CM | POA: Diagnosis not present

## 2015-03-24 NOTE — Telephone Encounter (Signed)
Received OT/Discharge Summary Report from [facility]; forwarded to provider/SLS 01/25

## 2015-03-25 DIAGNOSIS — N2581 Secondary hyperparathyroidism of renal origin: Secondary | ICD-10-CM | POA: Diagnosis not present

## 2015-03-25 DIAGNOSIS — D631 Anemia in chronic kidney disease: Secondary | ICD-10-CM | POA: Diagnosis not present

## 2015-03-25 DIAGNOSIS — N186 End stage renal disease: Secondary | ICD-10-CM | POA: Diagnosis not present

## 2015-03-25 DIAGNOSIS — Z23 Encounter for immunization: Secondary | ICD-10-CM | POA: Diagnosis not present

## 2015-03-25 DIAGNOSIS — D509 Iron deficiency anemia, unspecified: Secondary | ICD-10-CM | POA: Diagnosis not present

## 2015-03-26 ENCOUNTER — Telehealth: Payer: Self-pay | Admitting: *Deleted

## 2015-03-26 ENCOUNTER — Telehealth: Payer: Self-pay | Admitting: Physician Assistant

## 2015-03-26 DIAGNOSIS — Z436 Encounter for attention to other artificial openings of urinary tract: Secondary | ICD-10-CM | POA: Diagnosis not present

## 2015-03-26 DIAGNOSIS — N200 Calculus of kidney: Secondary | ICD-10-CM | POA: Diagnosis not present

## 2015-03-26 DIAGNOSIS — N3000 Acute cystitis without hematuria: Secondary | ICD-10-CM | POA: Diagnosis not present

## 2015-03-26 NOTE — Telephone Encounter (Signed)
Caller name: Anderson Malta-- [facility] Physical Therapist    Can be reached: [phone]   Reason for call: She says that the pt's PT ended this week. She would like to have verbal orders to continue working with pt next week for twice a week for 6 weeks .    Please assist further   Thanks.

## 2015-03-26 NOTE — Telephone Encounter (Signed)
Ok to [employer] order.

## 2015-03-26 NOTE — Telephone Encounter (Signed)
PT re certification verbal okay given to Anderson Malta [facility] PT)

## 2015-03-26 NOTE — Telephone Encounter (Signed)
Received fax from [place] requesting PA for Lantus Insulin; initiated via Cover My Meds, awaiting response/SLS 01/27

## 2015-03-27 DIAGNOSIS — D509 Iron deficiency anemia, unspecified: Secondary | ICD-10-CM | POA: Diagnosis not present

## 2015-03-27 DIAGNOSIS — N2581 Secondary hyperparathyroidism of renal origin: Secondary | ICD-10-CM | POA: Diagnosis not present

## 2015-03-27 DIAGNOSIS — Z23 Encounter for immunization: Secondary | ICD-10-CM | POA: Diagnosis not present

## 2015-03-27 DIAGNOSIS — D631 Anemia in chronic kidney disease: Secondary | ICD-10-CM | POA: Diagnosis not present

## 2015-03-27 DIAGNOSIS — N186 End stage renal disease: Secondary | ICD-10-CM | POA: Diagnosis not present

## 2015-03-28 DIAGNOSIS — E1122 Type 2 diabetes mellitus with diabetic chronic kidney disease: Secondary | ICD-10-CM | POA: Diagnosis not present

## 2015-03-28 DIAGNOSIS — N186 End stage renal disease: Secondary | ICD-10-CM | POA: Diagnosis not present

## 2015-03-28 DIAGNOSIS — I120 Hypertensive chronic kidney disease with stage 5 chronic kidney disease or end stage renal disease: Secondary | ICD-10-CM | POA: Diagnosis not present

## 2015-03-28 DIAGNOSIS — M1990 Unspecified osteoarthritis, unspecified site: Secondary | ICD-10-CM | POA: Diagnosis not present

## 2015-03-28 DIAGNOSIS — L89312 Pressure ulcer of right buttock, stage 2: Secondary | ICD-10-CM | POA: Diagnosis not present

## 2015-03-28 DIAGNOSIS — E1151 Type 2 diabetes mellitus with diabetic peripheral angiopathy without gangrene: Secondary | ICD-10-CM | POA: Diagnosis not present

## 2015-03-29 ENCOUNTER — Telehealth: Payer: Self-pay | Admitting: *Deleted

## 2015-03-29 DIAGNOSIS — E1122 Type 2 diabetes mellitus with diabetic chronic kidney disease: Secondary | ICD-10-CM | POA: Diagnosis not present

## 2015-03-29 DIAGNOSIS — M1990 Unspecified osteoarthritis, unspecified site: Secondary | ICD-10-CM | POA: Diagnosis not present

## 2015-03-29 DIAGNOSIS — E1151 Type 2 diabetes mellitus with diabetic peripheral angiopathy without gangrene: Secondary | ICD-10-CM | POA: Diagnosis not present

## 2015-03-29 DIAGNOSIS — L89312 Pressure ulcer of right buttock, stage 2: Secondary | ICD-10-CM | POA: Diagnosis not present

## 2015-03-29 DIAGNOSIS — N186 End stage renal disease: Secondary | ICD-10-CM | POA: Diagnosis not present

## 2015-03-29 DIAGNOSIS — I120 Hypertensive chronic kidney disease with stage 5 chronic kidney disease or end stage renal disease: Secondary | ICD-10-CM | POA: Diagnosis not present

## 2015-03-29 NOTE — Telephone Encounter (Signed)
Verbal orders given per [name] to [facility] w/ [facility] Go for continued wound care for 60 days and fasting A1C, CMP, and Lipid.

## 2015-03-30 DIAGNOSIS — D631 Anemia in chronic kidney disease: Secondary | ICD-10-CM | POA: Diagnosis not present

## 2015-03-30 DIAGNOSIS — Z23 Encounter for immunization: Secondary | ICD-10-CM | POA: Diagnosis not present

## 2015-03-30 DIAGNOSIS — D509 Iron deficiency anemia, unspecified: Secondary | ICD-10-CM | POA: Diagnosis not present

## 2015-03-30 DIAGNOSIS — N186 End stage renal disease: Secondary | ICD-10-CM | POA: Diagnosis not present

## 2015-03-30 DIAGNOSIS — N2581 Secondary hyperparathyroidism of renal origin: Secondary | ICD-10-CM | POA: Diagnosis not present

## 2015-03-31 ENCOUNTER — Telehealth: Payer: Self-pay | Admitting: *Deleted

## 2015-03-31 DIAGNOSIS — E1122 Type 2 diabetes mellitus with diabetic chronic kidney disease: Secondary | ICD-10-CM | POA: Diagnosis not present

## 2015-03-31 DIAGNOSIS — N186 End stage renal disease: Secondary | ICD-10-CM | POA: Diagnosis not present

## 2015-03-31 DIAGNOSIS — I10 Essential (primary) hypertension: Secondary | ICD-10-CM | POA: Diagnosis not present

## 2015-03-31 DIAGNOSIS — M1990 Unspecified osteoarthritis, unspecified site: Secondary | ICD-10-CM | POA: Diagnosis not present

## 2015-03-31 DIAGNOSIS — L89312 Pressure ulcer of right buttock, stage 2: Secondary | ICD-10-CM | POA: Diagnosis not present

## 2015-03-31 DIAGNOSIS — I120 Hypertensive chronic kidney disease with stage 5 chronic kidney disease or end stage renal disease: Secondary | ICD-10-CM | POA: Diagnosis not present

## 2015-03-31 DIAGNOSIS — D631 Anemia in chronic kidney disease: Secondary | ICD-10-CM | POA: Diagnosis not present

## 2015-03-31 DIAGNOSIS — E1151 Type 2 diabetes mellitus with diabetic peripheral angiopathy without gangrene: Secondary | ICD-10-CM | POA: Diagnosis not present

## 2015-03-31 NOTE — Telephone Encounter (Signed)
Received fax from [facility] for [facility] Conference Report F/U; forwarded to provider/SLS 02/01

## 2015-03-31 NOTE — Telephone Encounter (Signed)
[employer] called for a Re-Initiation of PA, when answering follow-up questions, completed and waiting on response [KEY: [id number]]/SLS 02/01

## 2015-04-01 DIAGNOSIS — K769 Liver disease, unspecified: Secondary | ICD-10-CM | POA: Diagnosis not present

## 2015-04-01 DIAGNOSIS — E1165 Type 2 diabetes mellitus with hyperglycemia: Secondary | ICD-10-CM | POA: Diagnosis not present

## 2015-04-01 DIAGNOSIS — N186 End stage renal disease: Secondary | ICD-10-CM | POA: Diagnosis not present

## 2015-04-01 DIAGNOSIS — D631 Anemia in chronic kidney disease: Secondary | ICD-10-CM | POA: Diagnosis not present

## 2015-04-01 DIAGNOSIS — D509 Iron deficiency anemia, unspecified: Secondary | ICD-10-CM | POA: Diagnosis not present

## 2015-04-01 NOTE — Telephone Encounter (Signed)
PA for lantus approved 03/31/15-03/30/16

## 2015-04-02 ENCOUNTER — Telehealth: Payer: Self-pay | Admitting: *Deleted

## 2015-04-02 DIAGNOSIS — L89312 Pressure ulcer of right buttock, stage 2: Secondary | ICD-10-CM | POA: Diagnosis not present

## 2015-04-02 DIAGNOSIS — E1151 Type 2 diabetes mellitus with diabetic peripheral angiopathy without gangrene: Secondary | ICD-10-CM | POA: Diagnosis not present

## 2015-04-02 DIAGNOSIS — M1990 Unspecified osteoarthritis, unspecified site: Secondary | ICD-10-CM | POA: Diagnosis not present

## 2015-04-02 DIAGNOSIS — I120 Hypertensive chronic kidney disease with stage 5 chronic kidney disease or end stage renal disease: Secondary | ICD-10-CM | POA: Diagnosis not present

## 2015-04-02 DIAGNOSIS — N186 End stage renal disease: Secondary | ICD-10-CM | POA: Diagnosis not present

## 2015-04-02 DIAGNOSIS — E1122 Type 2 diabetes mellitus with diabetic chronic kidney disease: Secondary | ICD-10-CM | POA: Diagnosis not present

## 2015-04-02 NOTE — Telephone Encounter (Signed)
Received [place] from [facility]; forwarded to provider/SLS 02/03

## 2015-04-03 DIAGNOSIS — D631 Anemia in chronic kidney disease: Secondary | ICD-10-CM | POA: Diagnosis not present

## 2015-04-03 DIAGNOSIS — D509 Iron deficiency anemia, unspecified: Secondary | ICD-10-CM | POA: Diagnosis not present

## 2015-04-03 DIAGNOSIS — N186 End stage renal disease: Secondary | ICD-10-CM | POA: Diagnosis not present

## 2015-04-05 NOTE — Telephone Encounter (Signed)
Completed form faxed, copy to scan, copy to billing/SLS 02/06

## 2015-04-06 DIAGNOSIS — N186 End stage renal disease: Secondary | ICD-10-CM | POA: Diagnosis not present

## 2015-04-06 DIAGNOSIS — D631 Anemia in chronic kidney disease: Secondary | ICD-10-CM | POA: Diagnosis not present

## 2015-04-06 DIAGNOSIS — D509 Iron deficiency anemia, unspecified: Secondary | ICD-10-CM | POA: Diagnosis not present

## 2015-04-07 DIAGNOSIS — E1122 Type 2 diabetes mellitus with diabetic chronic kidney disease: Secondary | ICD-10-CM | POA: Diagnosis not present

## 2015-04-07 DIAGNOSIS — N186 End stage renal disease: Secondary | ICD-10-CM | POA: Diagnosis not present

## 2015-04-07 DIAGNOSIS — M1990 Unspecified osteoarthritis, unspecified site: Secondary | ICD-10-CM | POA: Diagnosis not present

## 2015-04-07 DIAGNOSIS — L89312 Pressure ulcer of right buttock, stage 2: Secondary | ICD-10-CM | POA: Diagnosis not present

## 2015-04-07 DIAGNOSIS — E1151 Type 2 diabetes mellitus with diabetic peripheral angiopathy without gangrene: Secondary | ICD-10-CM | POA: Diagnosis not present

## 2015-04-07 DIAGNOSIS — I120 Hypertensive chronic kidney disease with stage 5 chronic kidney disease or end stage renal disease: Secondary | ICD-10-CM | POA: Diagnosis not present

## 2015-04-07 NOTE — Telephone Encounter (Signed)
Received confirmation letter for PA Approval valid 03/31/15 through 03/30/16, sent to scan/SLS 02/08

## 2015-04-08 DIAGNOSIS — N186 End stage renal disease: Secondary | ICD-10-CM | POA: Diagnosis not present

## 2015-04-08 DIAGNOSIS — D631 Anemia in chronic kidney disease: Secondary | ICD-10-CM | POA: Diagnosis not present

## 2015-04-08 DIAGNOSIS — D509 Iron deficiency anemia, unspecified: Secondary | ICD-10-CM | POA: Diagnosis not present

## 2015-04-09 ENCOUNTER — Telehealth: Payer: Self-pay | Admitting: *Deleted

## 2015-04-09 DIAGNOSIS — I120 Hypertensive chronic kidney disease with stage 5 chronic kidney disease or end stage renal disease: Secondary | ICD-10-CM | POA: Diagnosis not present

## 2015-04-09 DIAGNOSIS — N186 End stage renal disease: Secondary | ICD-10-CM | POA: Diagnosis not present

## 2015-04-09 DIAGNOSIS — E1151 Type 2 diabetes mellitus with diabetic peripheral angiopathy without gangrene: Secondary | ICD-10-CM | POA: Diagnosis not present

## 2015-04-09 DIAGNOSIS — E1122 Type 2 diabetes mellitus with diabetic chronic kidney disease: Secondary | ICD-10-CM | POA: Diagnosis not present

## 2015-04-09 DIAGNOSIS — L89312 Pressure ulcer of right buttock, stage 2: Secondary | ICD-10-CM | POA: Diagnosis not present

## 2015-04-09 DIAGNOSIS — M1990 Unspecified osteoarthritis, unspecified site: Secondary | ICD-10-CM | POA: Diagnosis not present

## 2015-04-09 NOTE — Telephone Encounter (Signed)
Received OT Plan of Care; forwarded to provider/SLS 02/10

## 2015-04-10 DIAGNOSIS — N186 End stage renal disease: Secondary | ICD-10-CM | POA: Diagnosis not present

## 2015-04-10 DIAGNOSIS — D631 Anemia in chronic kidney disease: Secondary | ICD-10-CM | POA: Diagnosis not present

## 2015-04-10 DIAGNOSIS — D509 Iron deficiency anemia, unspecified: Secondary | ICD-10-CM | POA: Diagnosis not present

## 2015-04-12 DIAGNOSIS — I120 Hypertensive chronic kidney disease with stage 5 chronic kidney disease or end stage renal disease: Secondary | ICD-10-CM | POA: Diagnosis not present

## 2015-04-12 DIAGNOSIS — N186 End stage renal disease: Secondary | ICD-10-CM | POA: Diagnosis not present

## 2015-04-12 DIAGNOSIS — E1122 Type 2 diabetes mellitus with diabetic chronic kidney disease: Secondary | ICD-10-CM | POA: Diagnosis not present

## 2015-04-12 DIAGNOSIS — E1151 Type 2 diabetes mellitus with diabetic peripheral angiopathy without gangrene: Secondary | ICD-10-CM | POA: Diagnosis not present

## 2015-04-12 DIAGNOSIS — M1990 Unspecified osteoarthritis, unspecified site: Secondary | ICD-10-CM | POA: Diagnosis not present

## 2015-04-12 DIAGNOSIS — L89312 Pressure ulcer of right buttock, stage 2: Secondary | ICD-10-CM | POA: Diagnosis not present

## 2015-04-13 ENCOUNTER — Telehealth: Payer: Self-pay | Admitting: Physician Assistant

## 2015-04-13 ENCOUNTER — Ambulatory Visit: Payer: Medicare Other | Admitting: Physician Assistant

## 2015-04-13 DIAGNOSIS — Z992 Dependence on renal dialysis: Secondary | ICD-10-CM | POA: Diagnosis not present

## 2015-04-13 DIAGNOSIS — E039 Hypothyroidism, unspecified: Secondary | ICD-10-CM | POA: Diagnosis not present

## 2015-04-13 DIAGNOSIS — Z86718 Personal history of other venous thrombosis and embolism: Secondary | ICD-10-CM | POA: Diagnosis not present

## 2015-04-13 DIAGNOSIS — R197 Diarrhea, unspecified: Secondary | ICD-10-CM | POA: Diagnosis not present

## 2015-04-13 DIAGNOSIS — N186 End stage renal disease: Secondary | ICD-10-CM | POA: Diagnosis not present

## 2015-04-13 DIAGNOSIS — N179 Acute kidney failure, unspecified: Secondary | ICD-10-CM | POA: Diagnosis not present

## 2015-04-13 DIAGNOSIS — N1330 Unspecified hydronephrosis: Secondary | ICD-10-CM | POA: Diagnosis not present

## 2015-04-13 DIAGNOSIS — I120 Hypertensive chronic kidney disease with stage 5 chronic kidney disease or end stage renal disease: Secondary | ICD-10-CM | POA: Diagnosis not present

## 2015-04-13 DIAGNOSIS — R110 Nausea: Secondary | ICD-10-CM | POA: Diagnosis not present

## 2015-04-13 DIAGNOSIS — E1122 Type 2 diabetes mellitus with diabetic chronic kidney disease: Secondary | ICD-10-CM | POA: Diagnosis not present

## 2015-04-13 NOTE — Telephone Encounter (Signed)
Immodium and increase fiber to bulk stools. Nothing stronger I can give without assessing him

## 2015-04-13 NOTE — Telephone Encounter (Signed)
No charge. 

## 2015-04-13 NOTE — Telephone Encounter (Signed)
Patient no show 3:45pm appointment today due to him going to [facility] patient LVM, charge or no charge

## 2015-04-13 NOTE — Telephone Encounter (Signed)
Faxed, sent to scan/SLS 02/14

## 2015-04-13 NOTE — Telephone Encounter (Signed)
Relation to [id number] Call back [phone] [id number] [place] [place] [address] [place], [address] AT [facility] [address] [phone] (Phone) [phone] (Fax)         Reason for call:  Patient experiecicing diarhea and wanted to know if there's anything stronger then imodium patient has dialysis today at 10:30am and will not be able to schedule an appointment in need of clinical advice

## 2015-04-13 NOTE — Telephone Encounter (Signed)
Called and spoke with the pt and informed him of the note below.  Pt verbalized understanding.  He stated that the Immodium is not working and he is starting to feel nauseated.  He don't know if he can come in or not.  Informed him that he can do a bland diet:Bananas,rice,applesauce, or toast.  Pt stated that he can not eat bananas, and he does not have any of the other foods in the house.  Pt decide to make an appt for today to see Cody at 3:30pm.//AB/CMA

## 2015-04-14 DIAGNOSIS — N186 End stage renal disease: Secondary | ICD-10-CM | POA: Diagnosis not present

## 2015-04-14 DIAGNOSIS — D509 Iron deficiency anemia, unspecified: Secondary | ICD-10-CM | POA: Diagnosis not present

## 2015-04-14 DIAGNOSIS — D631 Anemia in chronic kidney disease: Secondary | ICD-10-CM | POA: Diagnosis not present

## 2015-04-15 DIAGNOSIS — D631 Anemia in chronic kidney disease: Secondary | ICD-10-CM | POA: Diagnosis not present

## 2015-04-15 DIAGNOSIS — N186 End stage renal disease: Secondary | ICD-10-CM | POA: Diagnosis not present

## 2015-04-15 DIAGNOSIS — D509 Iron deficiency anemia, unspecified: Secondary | ICD-10-CM | POA: Diagnosis not present

## 2015-04-16 ENCOUNTER — Telehealth: Payer: Self-pay | Admitting: Physician Assistant

## 2015-04-16 DIAGNOSIS — E1122 Type 2 diabetes mellitus with diabetic chronic kidney disease: Secondary | ICD-10-CM | POA: Diagnosis not present

## 2015-04-16 DIAGNOSIS — I120 Hypertensive chronic kidney disease with stage 5 chronic kidney disease or end stage renal disease: Secondary | ICD-10-CM | POA: Diagnosis not present

## 2015-04-16 DIAGNOSIS — M1990 Unspecified osteoarthritis, unspecified site: Secondary | ICD-10-CM | POA: Diagnosis not present

## 2015-04-16 DIAGNOSIS — N186 End stage renal disease: Secondary | ICD-10-CM | POA: Diagnosis not present

## 2015-04-16 DIAGNOSIS — E1151 Type 2 diabetes mellitus with diabetic peripheral angiopathy without gangrene: Secondary | ICD-10-CM | POA: Diagnosis not present

## 2015-04-16 DIAGNOSIS — L89312 Pressure ulcer of right buttock, stage 2: Secondary | ICD-10-CM | POA: Diagnosis not present

## 2015-04-16 NOTE — Telephone Encounter (Signed)
Caller name: Olin Hauser from [place]  Relation to pt: RN Call back number: [phone]  [id number] [place], [place] - [place], [address] AT [place] [phone] (Phone) [phone] (Fax)         Reason for call:  Requesting a refill diphen/atropine [facility] prescribed Tuesday for patient diarrhea

## 2015-04-16 NOTE — Telephone Encounter (Signed)
Informed the patient of the provider's recommendations below. Patient voiced that he was not going to the ED or urgent care. Offered to schedule an appointment with PCP, but the patient hung up the phone.

## 2015-04-16 NOTE — Telephone Encounter (Signed)
Call patient to assess symptoms. That medication is only to be used for 48 hours max. If still symptomatic he may need to be seen in the ER or an urgent care ASAP.

## 2015-04-17 DIAGNOSIS — D631 Anemia in chronic kidney disease: Secondary | ICD-10-CM | POA: Diagnosis not present

## 2015-04-17 DIAGNOSIS — N186 End stage renal disease: Secondary | ICD-10-CM | POA: Diagnosis not present

## 2015-04-17 DIAGNOSIS — D509 Iron deficiency anemia, unspecified: Secondary | ICD-10-CM | POA: Diagnosis not present

## 2015-04-19 DIAGNOSIS — I120 Hypertensive chronic kidney disease with stage 5 chronic kidney disease or end stage renal disease: Secondary | ICD-10-CM | POA: Diagnosis not present

## 2015-04-19 DIAGNOSIS — E1151 Type 2 diabetes mellitus with diabetic peripheral angiopathy without gangrene: Secondary | ICD-10-CM | POA: Diagnosis not present

## 2015-04-19 DIAGNOSIS — L89312 Pressure ulcer of right buttock, stage 2: Secondary | ICD-10-CM | POA: Diagnosis not present

## 2015-04-19 DIAGNOSIS — N186 End stage renal disease: Secondary | ICD-10-CM | POA: Diagnosis not present

## 2015-04-19 DIAGNOSIS — M1990 Unspecified osteoarthritis, unspecified site: Secondary | ICD-10-CM | POA: Diagnosis not present

## 2015-04-19 DIAGNOSIS — E1122 Type 2 diabetes mellitus with diabetic chronic kidney disease: Secondary | ICD-10-CM | POA: Diagnosis not present

## 2015-04-20 DIAGNOSIS — D509 Iron deficiency anemia, unspecified: Secondary | ICD-10-CM | POA: Diagnosis not present

## 2015-04-20 DIAGNOSIS — D631 Anemia in chronic kidney disease: Secondary | ICD-10-CM | POA: Diagnosis not present

## 2015-04-20 DIAGNOSIS — N186 End stage renal disease: Secondary | ICD-10-CM | POA: Diagnosis not present

## 2015-04-21 ENCOUNTER — Telehealth: Payer: Self-pay | Admitting: Physician Assistant

## 2015-04-21 DIAGNOSIS — N186 End stage renal disease: Secondary | ICD-10-CM | POA: Diagnosis not present

## 2015-04-21 DIAGNOSIS — E1122 Type 2 diabetes mellitus with diabetic chronic kidney disease: Secondary | ICD-10-CM | POA: Diagnosis not present

## 2015-04-21 DIAGNOSIS — L89312 Pressure ulcer of right buttock, stage 2: Secondary | ICD-10-CM | POA: Diagnosis not present

## 2015-04-21 DIAGNOSIS — I120 Hypertensive chronic kidney disease with stage 5 chronic kidney disease or end stage renal disease: Secondary | ICD-10-CM | POA: Diagnosis not present

## 2015-04-21 DIAGNOSIS — E1151 Type 2 diabetes mellitus with diabetic peripheral angiopathy without gangrene: Secondary | ICD-10-CM | POA: Diagnosis not present

## 2015-04-21 DIAGNOSIS — M1990 Unspecified osteoarthritis, unspecified site: Secondary | ICD-10-CM | POA: Diagnosis not present

## 2015-04-21 NOTE — Telephone Encounter (Signed)
Caller name: Eleno   Relationship to patient: Self  Can be [phone]  [id number] [place], [place] - [place], [address] AT [place]  Reason for call: Pt says that pharmacy sent over fax requesting PA on test strips. Pt says that he is out of strips. Pt says that he was told by pharmacy that if not approved insurance will no longer cover.   Please assist further.

## 2015-04-22 DIAGNOSIS — D631 Anemia in chronic kidney disease: Secondary | ICD-10-CM | POA: Diagnosis not present

## 2015-04-22 DIAGNOSIS — D509 Iron deficiency anemia, unspecified: Secondary | ICD-10-CM | POA: Diagnosis not present

## 2015-04-22 DIAGNOSIS — N186 End stage renal disease: Secondary | ICD-10-CM | POA: Diagnosis not present

## 2015-04-22 NOTE — Telephone Encounter (Signed)
[facility]ed [facility] [phone] spoke with Almyra Free to begin [place], Approved [id number] from 04/22/2015 until 04/21/2016. Called and Notified pt.

## 2015-04-24 DIAGNOSIS — D509 Iron deficiency anemia, unspecified: Secondary | ICD-10-CM | POA: Diagnosis not present

## 2015-04-24 DIAGNOSIS — N186 End stage renal disease: Secondary | ICD-10-CM | POA: Diagnosis not present

## 2015-04-24 DIAGNOSIS — D631 Anemia in chronic kidney disease: Secondary | ICD-10-CM | POA: Diagnosis not present

## 2015-04-26 DIAGNOSIS — I120 Hypertensive chronic kidney disease with stage 5 chronic kidney disease or end stage renal disease: Secondary | ICD-10-CM | POA: Diagnosis not present

## 2015-04-26 DIAGNOSIS — E1151 Type 2 diabetes mellitus with diabetic peripheral angiopathy without gangrene: Secondary | ICD-10-CM | POA: Diagnosis not present

## 2015-04-26 DIAGNOSIS — L89312 Pressure ulcer of right buttock, stage 2: Secondary | ICD-10-CM | POA: Diagnosis not present

## 2015-04-26 DIAGNOSIS — M1990 Unspecified osteoarthritis, unspecified site: Secondary | ICD-10-CM | POA: Diagnosis not present

## 2015-04-26 DIAGNOSIS — N186 End stage renal disease: Secondary | ICD-10-CM | POA: Diagnosis not present

## 2015-04-26 DIAGNOSIS — E1122 Type 2 diabetes mellitus with diabetic chronic kidney disease: Secondary | ICD-10-CM | POA: Diagnosis not present

## 2015-04-27 DIAGNOSIS — E1151 Type 2 diabetes mellitus with diabetic peripheral angiopathy without gangrene: Secondary | ICD-10-CM | POA: Diagnosis not present

## 2015-04-27 DIAGNOSIS — E1122 Type 2 diabetes mellitus with diabetic chronic kidney disease: Secondary | ICD-10-CM | POA: Diagnosis not present

## 2015-04-27 DIAGNOSIS — M1990 Unspecified osteoarthritis, unspecified site: Secondary | ICD-10-CM | POA: Diagnosis not present

## 2015-04-27 DIAGNOSIS — N186 End stage renal disease: Secondary | ICD-10-CM | POA: Diagnosis not present

## 2015-04-27 DIAGNOSIS — L89312 Pressure ulcer of right buttock, stage 2: Secondary | ICD-10-CM | POA: Diagnosis not present

## 2015-04-27 DIAGNOSIS — I120 Hypertensive chronic kidney disease with stage 5 chronic kidney disease or end stage renal disease: Secondary | ICD-10-CM | POA: Diagnosis not present

## 2015-04-28 ENCOUNTER — Telehealth: Payer: Self-pay | Admitting: *Deleted

## 2015-04-28 DIAGNOSIS — I10 Essential (primary) hypertension: Secondary | ICD-10-CM | POA: Diagnosis not present

## 2015-04-28 DIAGNOSIS — N186 End stage renal disease: Secondary | ICD-10-CM | POA: Diagnosis not present

## 2015-04-28 DIAGNOSIS — Z23 Encounter for immunization: Secondary | ICD-10-CM | POA: Diagnosis not present

## 2015-04-28 DIAGNOSIS — D509 Iron deficiency anemia, unspecified: Secondary | ICD-10-CM | POA: Diagnosis not present

## 2015-04-28 DIAGNOSIS — D631 Anemia in chronic kidney disease: Secondary | ICD-10-CM | POA: Diagnosis not present

## 2015-04-28 NOTE — Telephone Encounter (Signed)
Error

## 2015-04-28 NOTE — Telephone Encounter (Signed)
Received Missed Visit Note; forwarded to provider/SLS 03/01

## 2015-04-29 DIAGNOSIS — D631 Anemia in chronic kidney disease: Secondary | ICD-10-CM | POA: Diagnosis not present

## 2015-04-29 DIAGNOSIS — Z23 Encounter for immunization: Secondary | ICD-10-CM | POA: Diagnosis not present

## 2015-04-29 DIAGNOSIS — D509 Iron deficiency anemia, unspecified: Secondary | ICD-10-CM | POA: Diagnosis not present

## 2015-04-29 DIAGNOSIS — E1165 Type 2 diabetes mellitus with hyperglycemia: Secondary | ICD-10-CM | POA: Diagnosis not present

## 2015-04-29 DIAGNOSIS — N186 End stage renal disease: Secondary | ICD-10-CM | POA: Diagnosis not present

## 2015-04-29 DIAGNOSIS — K769 Liver disease, unspecified: Secondary | ICD-10-CM | POA: Diagnosis not present

## 2015-04-29 LAB — BASIC METABOLIC PANEL
BUN: 15 mg/dL (ref 4–21)
CREATININE: 5.6 mg/dL — AB (ref 0.6–1.3)
GLUCOSE: 124 mg/dL
POTASSIUM: 4.5 mmol/L (ref 3.4–5.3)
Sodium: 136 mmol/L — AB (ref 137–147)

## 2015-04-29 LAB — LIPID PANEL
CHOLESTEROL: 65 mg/dL (ref 0–200)
HDL: 19 mg/dL — AB (ref 35–70)
LDL Cholesterol: 29 mg/dL
Triglycerides: 106 mg/dL (ref 40–160)

## 2015-04-29 LAB — HEPATIC FUNCTION PANEL
ALK PHOS: 106 U/L (ref 25–125)
ALT: 6 U/L — AB (ref 10–40)
AST: 5 U/L — AB (ref 14–40)

## 2015-04-29 LAB — CBC AND DIFFERENTIAL
HCT: 37 % — AB (ref 41–53)
HEMOGLOBIN: 12 g/dL — AB (ref 13.5–17.5)
PLATELETS: 117 10*3/uL — AB (ref 150–399)
WBC: 4.9 10*3/mL

## 2015-04-29 LAB — HEMOGLOBIN A1C: Hemoglobin A1C: 5.3

## 2015-04-30 ENCOUNTER — Telehealth: Payer: Self-pay | Admitting: Physician Assistant

## 2015-04-30 DIAGNOSIS — M1990 Unspecified osteoarthritis, unspecified site: Secondary | ICD-10-CM | POA: Diagnosis not present

## 2015-04-30 DIAGNOSIS — L89312 Pressure ulcer of right buttock, stage 2: Secondary | ICD-10-CM | POA: Diagnosis not present

## 2015-04-30 DIAGNOSIS — I120 Hypertensive chronic kidney disease with stage 5 chronic kidney disease or end stage renal disease: Secondary | ICD-10-CM | POA: Diagnosis not present

## 2015-04-30 DIAGNOSIS — E1151 Type 2 diabetes mellitus with diabetic peripheral angiopathy without gangrene: Secondary | ICD-10-CM | POA: Diagnosis not present

## 2015-04-30 DIAGNOSIS — N186 End stage renal disease: Secondary | ICD-10-CM | POA: Diagnosis not present

## 2015-04-30 DIAGNOSIS — E1122 Type 2 diabetes mellitus with diabetic chronic kidney disease: Secondary | ICD-10-CM | POA: Diagnosis not present

## 2015-04-30 NOTE — Telephone Encounter (Signed)
Called and spoke with Anderson Malta with [facility] and asked her if the social work could be ordered through [place], and she stated that yes it can.  Verbal orders was given to Day she stated that she will get the order written up and faxed to [place] for [facility] to sign.//AB/CMA

## 2015-04-30 NOTE — Telephone Encounter (Signed)
Please advise.//AB/CMA 

## 2015-04-30 NOTE — Telephone Encounter (Signed)
Caller name: Anderson Malta Relationship to patient: PT for [facility] Can be reached: [phone]   Reason for call: Request a referral for a Social Worker to assist with [employer] for patient with possible placement. States they can not perform PT in home daily as it ws ordered so patient need to have [facility] or another source.

## 2015-04-30 NOTE — Telephone Encounter (Signed)
Please call and see if social work can be ordered through [place]. If so ok to give verbal order.

## 2015-05-01 DIAGNOSIS — D631 Anemia in chronic kidney disease: Secondary | ICD-10-CM | POA: Diagnosis not present

## 2015-05-01 DIAGNOSIS — D509 Iron deficiency anemia, unspecified: Secondary | ICD-10-CM | POA: Diagnosis not present

## 2015-05-01 DIAGNOSIS — Z23 Encounter for immunization: Secondary | ICD-10-CM | POA: Diagnosis not present

## 2015-05-01 DIAGNOSIS — N186 End stage renal disease: Secondary | ICD-10-CM | POA: Diagnosis not present

## 2015-05-04 ENCOUNTER — Telehealth: Payer: Self-pay | Admitting: *Deleted

## 2015-05-04 DIAGNOSIS — D509 Iron deficiency anemia, unspecified: Secondary | ICD-10-CM | POA: Diagnosis not present

## 2015-05-04 DIAGNOSIS — D631 Anemia in chronic kidney disease: Secondary | ICD-10-CM | POA: Diagnosis not present

## 2015-05-04 DIAGNOSIS — N186 End stage renal disease: Secondary | ICD-10-CM | POA: Diagnosis not present

## 2015-05-04 DIAGNOSIS — Z23 Encounter for immunization: Secondary | ICD-10-CM | POA: Diagnosis not present

## 2015-05-04 NOTE — Telephone Encounter (Signed)
Received Missed Visit note, this is one of multiple notices for this patient; forwarded to provider/SLS 03/07

## 2015-05-05 ENCOUNTER — Telehealth: Payer: Self-pay | Admitting: Physician Assistant

## 2015-05-05 DIAGNOSIS — N186 End stage renal disease: Secondary | ICD-10-CM | POA: Diagnosis not present

## 2015-05-05 DIAGNOSIS — E1122 Type 2 diabetes mellitus with diabetic chronic kidney disease: Secondary | ICD-10-CM | POA: Diagnosis not present

## 2015-05-05 DIAGNOSIS — I120 Hypertensive chronic kidney disease with stage 5 chronic kidney disease or end stage renal disease: Secondary | ICD-10-CM | POA: Diagnosis not present

## 2015-05-05 DIAGNOSIS — E1151 Type 2 diabetes mellitus with diabetic peripheral angiopathy without gangrene: Secondary | ICD-10-CM | POA: Diagnosis not present

## 2015-05-05 DIAGNOSIS — M1990 Unspecified osteoarthritis, unspecified site: Secondary | ICD-10-CM | POA: Diagnosis not present

## 2015-05-05 DIAGNOSIS — L89312 Pressure ulcer of right buttock, stage 2: Secondary | ICD-10-CM | POA: Diagnosis not present

## 2015-05-05 NOTE — Telephone Encounter (Signed)
Please call patient to let him know I am aware of all the missed visits with home health. They will eventually dismiss him from their services which would be detrimental to his health. I have also sent RN out to get lab work that he refuses to have collected here in office. I only allowed him to postpone labs if he would let me have home RN obtain them. If he is not compliant with seeing home health services and not compliant with my instructions then I will have to dismiss him as a patient as I cannot supervise his neglect of his health.

## 2015-05-05 NOTE — Telephone Encounter (Signed)
Caller name: Anderson Malta Relationship to patient: [facility] Can be reached:[phone]   Reason for call: Request extension for PT for patient beginning on 3/13 for 2 weeks 2 and 1 week 1

## 2015-05-05 NOTE — Telephone Encounter (Signed)
Called and spoke with Anderson Malta @ [facility] and gave verbal orders per [name]- for the extension for PT for the pt beginning on 3/13 for 2 weeks 2 and 1 week. Per [name] are they going to continue to see the pt?  He has received faxed reports that they have not been able to get in contact with the pt to do the PT.  Anderson Malta stated that she's not sure where the reports are coming from it may be coming from the nurse.   She said they have been during PT with the pt.  Anderson Malta took the verbal order and stated that she will fax over a form for [facility] to sign and fax back.//AB/CMA

## 2015-05-06 DIAGNOSIS — N186 End stage renal disease: Secondary | ICD-10-CM | POA: Diagnosis not present

## 2015-05-06 DIAGNOSIS — D631 Anemia in chronic kidney disease: Secondary | ICD-10-CM | POA: Diagnosis not present

## 2015-05-06 DIAGNOSIS — Z23 Encounter for immunization: Secondary | ICD-10-CM | POA: Diagnosis not present

## 2015-05-06 DIAGNOSIS — D509 Iron deficiency anemia, unspecified: Secondary | ICD-10-CM | POA: Diagnosis not present

## 2015-05-07 ENCOUNTER — Telehealth: Payer: Self-pay | Admitting: *Deleted

## 2015-05-07 DIAGNOSIS — E1122 Type 2 diabetes mellitus with diabetic chronic kidney disease: Secondary | ICD-10-CM | POA: Diagnosis not present

## 2015-05-07 DIAGNOSIS — I120 Hypertensive chronic kidney disease with stage 5 chronic kidney disease or end stage renal disease: Secondary | ICD-10-CM | POA: Diagnosis not present

## 2015-05-07 DIAGNOSIS — M1990 Unspecified osteoarthritis, unspecified site: Secondary | ICD-10-CM | POA: Diagnosis not present

## 2015-05-07 DIAGNOSIS — L89312 Pressure ulcer of right buttock, stage 2: Secondary | ICD-10-CM | POA: Diagnosis not present

## 2015-05-07 DIAGNOSIS — N186 End stage renal disease: Secondary | ICD-10-CM | POA: Diagnosis not present

## 2015-05-07 DIAGNOSIS — E1151 Type 2 diabetes mellitus with diabetic peripheral angiopathy without gangrene: Secondary | ICD-10-CM | POA: Diagnosis not present

## 2015-05-07 NOTE — Telephone Encounter (Signed)
Received Physician communication and Interim Order; forwarded to provider/SLS 03/10

## 2015-05-07 NOTE — Telephone Encounter (Signed)
Received Nursing Summary/Discharge Report; forwarded to provider/SLS 03/10

## 2015-05-08 DIAGNOSIS — D631 Anemia in chronic kidney disease: Secondary | ICD-10-CM | POA: Diagnosis not present

## 2015-05-08 DIAGNOSIS — N186 End stage renal disease: Secondary | ICD-10-CM | POA: Diagnosis not present

## 2015-05-08 DIAGNOSIS — Z23 Encounter for immunization: Secondary | ICD-10-CM | POA: Diagnosis not present

## 2015-05-08 DIAGNOSIS — D509 Iron deficiency anemia, unspecified: Secondary | ICD-10-CM | POA: Diagnosis not present

## 2015-05-11 DIAGNOSIS — D509 Iron deficiency anemia, unspecified: Secondary | ICD-10-CM | POA: Diagnosis not present

## 2015-05-11 DIAGNOSIS — D631 Anemia in chronic kidney disease: Secondary | ICD-10-CM | POA: Diagnosis not present

## 2015-05-11 DIAGNOSIS — N186 End stage renal disease: Secondary | ICD-10-CM | POA: Diagnosis not present

## 2015-05-11 DIAGNOSIS — Z23 Encounter for immunization: Secondary | ICD-10-CM | POA: Diagnosis not present

## 2015-05-12 DIAGNOSIS — M1990 Unspecified osteoarthritis, unspecified site: Secondary | ICD-10-CM | POA: Diagnosis not present

## 2015-05-12 DIAGNOSIS — I120 Hypertensive chronic kidney disease with stage 5 chronic kidney disease or end stage renal disease: Secondary | ICD-10-CM | POA: Diagnosis not present

## 2015-05-12 DIAGNOSIS — N186 End stage renal disease: Secondary | ICD-10-CM | POA: Diagnosis not present

## 2015-05-12 DIAGNOSIS — L89312 Pressure ulcer of right buttock, stage 2: Secondary | ICD-10-CM | POA: Diagnosis not present

## 2015-05-12 DIAGNOSIS — E1151 Type 2 diabetes mellitus with diabetic peripheral angiopathy without gangrene: Secondary | ICD-10-CM | POA: Diagnosis not present

## 2015-05-12 DIAGNOSIS — E1122 Type 2 diabetes mellitus with diabetic chronic kidney disease: Secondary | ICD-10-CM | POA: Diagnosis not present

## 2015-05-13 DIAGNOSIS — D509 Iron deficiency anemia, unspecified: Secondary | ICD-10-CM | POA: Diagnosis not present

## 2015-05-13 DIAGNOSIS — Z23 Encounter for immunization: Secondary | ICD-10-CM | POA: Diagnosis not present

## 2015-05-13 DIAGNOSIS — N186 End stage renal disease: Secondary | ICD-10-CM | POA: Diagnosis not present

## 2015-05-13 DIAGNOSIS — D631 Anemia in chronic kidney disease: Secondary | ICD-10-CM | POA: Diagnosis not present

## 2015-05-14 DIAGNOSIS — I120 Hypertensive chronic kidney disease with stage 5 chronic kidney disease or end stage renal disease: Secondary | ICD-10-CM | POA: Diagnosis not present

## 2015-05-14 DIAGNOSIS — M1990 Unspecified osteoarthritis, unspecified site: Secondary | ICD-10-CM | POA: Diagnosis not present

## 2015-05-14 DIAGNOSIS — L89312 Pressure ulcer of right buttock, stage 2: Secondary | ICD-10-CM | POA: Diagnosis not present

## 2015-05-14 DIAGNOSIS — E1151 Type 2 diabetes mellitus with diabetic peripheral angiopathy without gangrene: Secondary | ICD-10-CM | POA: Diagnosis not present

## 2015-05-14 DIAGNOSIS — N186 End stage renal disease: Secondary | ICD-10-CM | POA: Diagnosis not present

## 2015-05-14 DIAGNOSIS — E1122 Type 2 diabetes mellitus with diabetic chronic kidney disease: Secondary | ICD-10-CM | POA: Diagnosis not present

## 2015-05-15 DIAGNOSIS — D631 Anemia in chronic kidney disease: Secondary | ICD-10-CM | POA: Diagnosis not present

## 2015-05-15 DIAGNOSIS — D509 Iron deficiency anemia, unspecified: Secondary | ICD-10-CM | POA: Diagnosis not present

## 2015-05-15 DIAGNOSIS — Z23 Encounter for immunization: Secondary | ICD-10-CM | POA: Diagnosis not present

## 2015-05-15 DIAGNOSIS — N186 End stage renal disease: Secondary | ICD-10-CM | POA: Diagnosis not present

## 2015-05-17 DIAGNOSIS — E1151 Type 2 diabetes mellitus with diabetic peripheral angiopathy without gangrene: Secondary | ICD-10-CM | POA: Diagnosis not present

## 2015-05-17 DIAGNOSIS — M1990 Unspecified osteoarthritis, unspecified site: Secondary | ICD-10-CM | POA: Diagnosis not present

## 2015-05-17 DIAGNOSIS — N186 End stage renal disease: Secondary | ICD-10-CM | POA: Diagnosis not present

## 2015-05-17 DIAGNOSIS — I120 Hypertensive chronic kidney disease with stage 5 chronic kidney disease or end stage renal disease: Secondary | ICD-10-CM | POA: Diagnosis not present

## 2015-05-17 DIAGNOSIS — E1122 Type 2 diabetes mellitus with diabetic chronic kidney disease: Secondary | ICD-10-CM | POA: Diagnosis not present

## 2015-05-17 DIAGNOSIS — L89312 Pressure ulcer of right buttock, stage 2: Secondary | ICD-10-CM | POA: Diagnosis not present

## 2015-05-18 DIAGNOSIS — D631 Anemia in chronic kidney disease: Secondary | ICD-10-CM | POA: Diagnosis not present

## 2015-05-18 DIAGNOSIS — D509 Iron deficiency anemia, unspecified: Secondary | ICD-10-CM | POA: Diagnosis not present

## 2015-05-18 DIAGNOSIS — Z23 Encounter for immunization: Secondary | ICD-10-CM | POA: Diagnosis not present

## 2015-05-18 DIAGNOSIS — N186 End stage renal disease: Secondary | ICD-10-CM | POA: Diagnosis not present

## 2015-05-19 ENCOUNTER — Encounter: Payer: Self-pay | Admitting: Physician Assistant

## 2015-05-19 ENCOUNTER — Ambulatory Visit (INDEPENDENT_AMBULATORY_CARE_PROVIDER_SITE_OTHER): Payer: Medicare Other | Admitting: Physician Assistant

## 2015-05-19 VITALS — BP 130/68 | HR 89 | Temp 98.6°F

## 2015-05-19 DIAGNOSIS — E1140 Type 2 diabetes mellitus with diabetic neuropathy, unspecified: Secondary | ICD-10-CM | POA: Diagnosis not present

## 2015-05-19 DIAGNOSIS — Z794 Long term (current) use of insulin: Secondary | ICD-10-CM | POA: Diagnosis not present

## 2015-05-19 DIAGNOSIS — G8929 Other chronic pain: Secondary | ICD-10-CM | POA: Diagnosis not present

## 2015-05-19 MED ORDER — FENTANYL 50 MCG/HR TD PT72: 50.0000 ug | MEDICATED_PATCH | TRANSDERMAL | Status: DC

## 2015-05-19 NOTE — Patient Instructions (Signed)
Please keep your phone on.  I am sending the home health RN back out for further wound care. I am also setting you up with ENT for further assessment of your ear.  I am sending orders to Dialysis for them to draw labs at your next visit this week. They will fax me results and I will call you.

## 2015-05-19 NOTE — Progress Notes (Signed)
Pre visit review using our clinic review tool, if applicable. No additional management support is needed unless otherwise documented below in the visit note. 

## 2015-05-19 NOTE — Progress Notes (Signed)
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 Patient has previously been noncompliant with follow-up and labs, taking insulin how he sees fit. At last visit patient was instructed to have labs drawn by home health nurse (orders faxed to [place]) but refused labs at time of collection. There were also several missed visit notes from RN at [facility] but patient denies this. Patient is attending Dialysis 3 days per week. Endorses he has had labs drawn there and is willing to have our requested labs drawn there. On discussion of how patient is currently taking medications, he states he takes them differently each day but will not comment further. His last A1C was on 06/04/14, almost 1 year ago and was at 4.8. Patient was instructed to stop long acting insulin but has not. Is not checking sugars. Eats mostly fattening foods. Has not followed up with Ophthalmology. States he is doing well with his health.   Past Medical History  Diagnosis Date  . Sleep apnea     uses cpap  . Hypothyroidism   . Anemia   . Blood transfusion   . Chronic kidney disease     hx of kidney stones  . Hypertension   . Arthritis     osteoarthritis  . Pneumonia   . Diabetes mellitus     insulin dependent  . Peripheral vascular disease ([place])   . Hyperlipidemia   . Morbid obesity ([place])   . Hypertrophy of prostate   . Acute respiratory failure ([place])   . Cellulitis and abscess of leg   . Chronic kidney disease, stage III (moderate)   . Nephrolithiasis   . Anxiety   . Chronic pain   . Embolism and thrombosis of unspecified artery ([place])   . DVT (deep venous thrombosis) ([facility])     Lower Extremity  . Personal history of arthritis     Osteoarthritis  . History of nephrolithiasis     Bilateral  . AR (allergic rhinitis)   . BPH (benign prostatic hypertrophy)   . Displacement of lumbar intervertebral disc without myelopathy     L4-L5  . Cholelithiasis   . Other lymphedema     . Unspecified septicemia   . Impotence of organic origin   . Pancreatitis, acute   . Diverticulosis of colon   . Gallstone     Current Outpatient Prescriptions on File Prior to Visit  Medication Sig Dispense Refill  . atorvastatin (LIPITOR) 40 MG tablet Take 1 tablet (40 mg total) by mouth daily. 90 tablet 1  . ferrous sulfate 325 (65 FE) MG tablet Take 1 tablet (325 mg total) by mouth 2 (two) times daily. 180 tablet 1  . gabapentin (NEURONTIN) 100 MG capsule TAKE 1 CAPSULE (100 MG TOTAL) BY MOUTH 3 (THREE) TIMES DAILY. 90 capsule 1  . glucose blood (BAYER CONTOUR TEST) test strip USE AS DIRECTED TO CHECK BLOOD SUGARS TWICE DAILY Dx: E11.9 100 each 3  . insulin aspart (NOVOLOG) 100 UNIT/ML injection INJECT 3 UNITS THREE TIMES DAILY WITH MEALS. AND AT NIGHTIME IF BLOOD SUGARS GREATER THAN 150 DX: E11.29 40 mL 1  . insulin glargine (LANTUS) 100 UNIT/ML injection Inject 0.4 mLs (40 Units total) into the skin at bedtime. 120 mL 1  . Insulin Syringe-Needle U-100 (B-D INS SYRINGE 0.5CC/31GX5/16) 31G X 5/16" 0.5 ML MISC USE 4 TIMES A DAY FOR DIABETES BLOOD GLUCOSE TESTING DX: E11.29 300 each 1  . levothyroxine (SYNTHROID, LEVOTHROID) 200 MCG tablet TAKE 1  TABLET (200 MCG TOTAL) BY MOUTH EVERY MORNING. 90 tablet 1  . levothyroxine (SYNTHROID, LEVOTHROID) 25 MCG tablet TAKE 1 TABLET (25 MCG TOTAL) BY MOUTH DAILY BEFORE BREAKFAST. 90 tablet 1  . Multiple Vitamins-Minerals (MULTIVITAMINS THER. W/MINERALS) TABS Take 1 tablet by mouth daily.     Marland Kitchen ofloxacin (FLOXIN) 0.3 % otic solution Place 10 drops into the left ear daily. 5 mL 0  . sevelamer carbonate (RENVELA) 800 MG tablet Take 800 mg by mouth 3 (three) times daily with meals.     No current facility-administered medications on file prior to visit.    No Known Allergies  Family History  Problem Relation Age of Onset  . Diabetes Mother   . Heart attack Mother   . Stroke Mother   . Colon cancer Father   . Dementia Father   . Heart attack  Father   . Arthritis Paternal Grandmother   . Hypertension Other   . Hypothyroidism Other     Social History   Social History  . Marital Status: Married    Spouse Name: N/A  . Number of Children: N/A  . Years of Education: N/A   Social History Main Topics  . Smoking status: Never Smoker   . Smokeless tobacco: Never Used  . Alcohol Use: No  . Drug Use: No  . Sexual Activity: Yes   Other Topics Concern  . None   Social History Narrative   Lives with his wife and uses a wheelchair for transfers.     Review of Systems - See HPI.  All other ROS are negative.  BP 130/68 mmHg  Pulse 89  Temp(Src) 98.6 F (37 C) (Oral)  Ht   Wt   SpO2 96%  Physical Exam  Constitutional: He is oriented to person, place, and time and well-developed, well-nourished, and in no distress.  HENT:  Head: Normocephalic and atraumatic.  Eyes: Conjunctivae are normal.  Neck: Neck supple.  Cardiovascular: Normal rate, regular rhythm, normal heart sounds and intact distal pulses.   Pulmonary/Chest: Effort normal and breath sounds normal. No respiratory distress. He has no wheezes. He has no rales. He exhibits no tenderness.  Musculoskeletal:  S/p R below knee amputation. Is not wearing prosthetic today.   Neurological: He is alert and oriented to person, place, and time.  Skin: Skin is warm and dry.  Psychiatric:  Unkempt. Notable food stains on clothing.   Vitals reviewed.   No results found for this or any previous visit (from the past 2160 hour(s)).  Assessment/Plan: Diabetes mellitus type II, controlled Discussed history of noncompliance. Has been noncompliant with medication instructions, Diabetic eye exams, labs, antiplatelet therapy, etc.  A1C previously very well controlled despite multiple comorbid conditions and relatively no regard for managing his own health. I am limited in what I can do for him as a healthcare provider due to his unwillingness to follow instructions. He is agreeable  to having labs updated through [place]. We have discussed issue in the past where he has requested labs through [facility] or Home Health RN but then refuses to have drawn when the time comes. Unfortunately I will not supervise his neglect of his healthcare. If he is not compliant with labs and further instructions he will be dismissed from our practice.

## 2015-05-20 DIAGNOSIS — N186 End stage renal disease: Secondary | ICD-10-CM | POA: Diagnosis not present

## 2015-05-20 DIAGNOSIS — D509 Iron deficiency anemia, unspecified: Secondary | ICD-10-CM | POA: Diagnosis not present

## 2015-05-20 DIAGNOSIS — Z23 Encounter for immunization: Secondary | ICD-10-CM | POA: Diagnosis not present

## 2015-05-20 DIAGNOSIS — D631 Anemia in chronic kidney disease: Secondary | ICD-10-CM | POA: Diagnosis not present

## 2015-05-21 ENCOUNTER — Other Ambulatory Visit: Payer: Self-pay | Admitting: Physician Assistant

## 2015-05-21 ENCOUNTER — Encounter: Payer: Self-pay | Admitting: Physician Assistant

## 2015-05-21 DIAGNOSIS — E119 Type 2 diabetes mellitus without complications: Secondary | ICD-10-CM

## 2015-05-21 DIAGNOSIS — M1990 Unspecified osteoarthritis, unspecified site: Secondary | ICD-10-CM | POA: Diagnosis not present

## 2015-05-21 DIAGNOSIS — I120 Hypertensive chronic kidney disease with stage 5 chronic kidney disease or end stage renal disease: Secondary | ICD-10-CM | POA: Diagnosis not present

## 2015-05-21 DIAGNOSIS — E1122 Type 2 diabetes mellitus with diabetic chronic kidney disease: Secondary | ICD-10-CM | POA: Diagnosis not present

## 2015-05-21 DIAGNOSIS — L89312 Pressure ulcer of right buttock, stage 2: Secondary | ICD-10-CM | POA: Diagnosis not present

## 2015-05-21 DIAGNOSIS — E1151 Type 2 diabetes mellitus with diabetic peripheral angiopathy without gangrene: Secondary | ICD-10-CM | POA: Diagnosis not present

## 2015-05-21 DIAGNOSIS — N186 End stage renal disease: Secondary | ICD-10-CM | POA: Diagnosis not present

## 2015-05-22 DIAGNOSIS — D631 Anemia in chronic kidney disease: Secondary | ICD-10-CM | POA: Diagnosis not present

## 2015-05-22 DIAGNOSIS — D509 Iron deficiency anemia, unspecified: Secondary | ICD-10-CM | POA: Diagnosis not present

## 2015-05-22 DIAGNOSIS — Z23 Encounter for immunization: Secondary | ICD-10-CM | POA: Diagnosis not present

## 2015-05-22 DIAGNOSIS — N186 End stage renal disease: Secondary | ICD-10-CM | POA: Diagnosis not present

## 2015-05-24 DIAGNOSIS — N186 End stage renal disease: Secondary | ICD-10-CM | POA: Diagnosis not present

## 2015-05-24 DIAGNOSIS — E1151 Type 2 diabetes mellitus with diabetic peripheral angiopathy without gangrene: Secondary | ICD-10-CM | POA: Diagnosis not present

## 2015-05-24 DIAGNOSIS — L89312 Pressure ulcer of right buttock, stage 2: Secondary | ICD-10-CM | POA: Diagnosis not present

## 2015-05-24 DIAGNOSIS — I120 Hypertensive chronic kidney disease with stage 5 chronic kidney disease or end stage renal disease: Secondary | ICD-10-CM | POA: Diagnosis not present

## 2015-05-24 DIAGNOSIS — M1990 Unspecified osteoarthritis, unspecified site: Secondary | ICD-10-CM | POA: Diagnosis not present

## 2015-05-24 DIAGNOSIS — E1122 Type 2 diabetes mellitus with diabetic chronic kidney disease: Secondary | ICD-10-CM | POA: Diagnosis not present

## 2015-05-25 ENCOUNTER — Telehealth: Payer: Self-pay | Admitting: Physician Assistant

## 2015-05-25 DIAGNOSIS — D509 Iron deficiency anemia, unspecified: Secondary | ICD-10-CM | POA: Diagnosis not present

## 2015-05-25 DIAGNOSIS — N186 End stage renal disease: Secondary | ICD-10-CM | POA: Diagnosis not present

## 2015-05-25 DIAGNOSIS — Z23 Encounter for immunization: Secondary | ICD-10-CM | POA: Diagnosis not present

## 2015-05-25 DIAGNOSIS — D631 Anemia in chronic kidney disease: Secondary | ICD-10-CM | POA: Diagnosis not present

## 2015-05-25 NOTE — Telephone Encounter (Signed)
OK to give verbal order for PT as requested

## 2015-05-25 NOTE — Telephone Encounter (Signed)
Called and spoke with Anderson Malta with [facility] and gave her the verbal order for the PT as requested.   She stated that she will fax over the form to be signed.//AB/CMA

## 2015-05-25 NOTE — Telephone Encounter (Signed)
Caller name: Anderson Malta - PT [facility]   Can be reached: [phone]  Reason for call: called requesting verbal orders to work with pt 1 time this week, 3 times for the next 4 weeks and twice the next two weeks.

## 2015-05-25 NOTE — Telephone Encounter (Signed)
Please advise.//AB/CMA 

## 2015-05-25 NOTE — Telephone Encounter (Signed)
Please add-on verbal order for repeat RN assessment and wound care as well.

## 2015-05-26 ENCOUNTER — Telehealth: Payer: Self-pay | Admitting: *Deleted

## 2015-05-26 ENCOUNTER — Telehealth: Payer: Self-pay | Admitting: Physician Assistant

## 2015-05-26 DIAGNOSIS — L8990 Pressure ulcer of unspecified site, unspecified stage: Secondary | ICD-10-CM

## 2015-05-26 DIAGNOSIS — H9209 Otalgia, unspecified ear: Secondary | ICD-10-CM

## 2015-05-26 NOTE — Telephone Encounter (Signed)
Can be reached: call home and/or cell  Reason for call: Pt states that he has not received call from nursing about his wound care that was discussed. Used [place] previously. Pt also asked about referral for ENT for ear problems discussed. Please f/u as needed.

## 2015-05-26 NOTE — Telephone Encounter (Signed)
Called and spoke with Anderson Malta with [facility] and gave her verbal orders from the note below to add-on RN wound care.  She stated that she will inform the nurse of the orders.  Called and informed the pt of the note below as well.  Pt verbalized understanding.//AB/CMA

## 2015-05-26 NOTE — Telephone Encounter (Signed)
Received request for Medical Records 05/24/14 to 05/24/15; forwarded to provider/SLS 03/29

## 2015-05-26 NOTE — Telephone Encounter (Signed)
Received Physician Communication and Interim Order; forwarded to provider/SLS 03/29

## 2015-05-26 NOTE — Telephone Encounter (Signed)
Again, please call to add-on RN wound care again. I will place another order as well.  Will resubmit ENT referral

## 2015-05-27 DIAGNOSIS — N186 End stage renal disease: Secondary | ICD-10-CM | POA: Diagnosis not present

## 2015-05-27 DIAGNOSIS — L89312 Pressure ulcer of right buttock, stage 2: Secondary | ICD-10-CM | POA: Diagnosis not present

## 2015-05-27 DIAGNOSIS — R531 Weakness: Secondary | ICD-10-CM | POA: Diagnosis not present

## 2015-05-27 DIAGNOSIS — I120 Hypertensive chronic kidney disease with stage 5 chronic kidney disease or end stage renal disease: Secondary | ICD-10-CM | POA: Diagnosis not present

## 2015-05-27 DIAGNOSIS — D631 Anemia in chronic kidney disease: Secondary | ICD-10-CM | POA: Diagnosis not present

## 2015-05-27 DIAGNOSIS — D509 Iron deficiency anemia, unspecified: Secondary | ICD-10-CM | POA: Diagnosis not present

## 2015-05-27 DIAGNOSIS — Z23 Encounter for immunization: Secondary | ICD-10-CM | POA: Diagnosis not present

## 2015-05-27 DIAGNOSIS — E1122 Type 2 diabetes mellitus with diabetic chronic kidney disease: Secondary | ICD-10-CM | POA: Diagnosis not present

## 2015-05-27 DIAGNOSIS — M1990 Unspecified osteoarthritis, unspecified site: Secondary | ICD-10-CM | POA: Diagnosis not present

## 2015-05-28 DIAGNOSIS — E1122 Type 2 diabetes mellitus with diabetic chronic kidney disease: Secondary | ICD-10-CM | POA: Diagnosis not present

## 2015-05-28 DIAGNOSIS — N186 End stage renal disease: Secondary | ICD-10-CM | POA: Diagnosis not present

## 2015-05-28 DIAGNOSIS — L89312 Pressure ulcer of right buttock, stage 2: Secondary | ICD-10-CM | POA: Diagnosis not present

## 2015-05-28 DIAGNOSIS — I120 Hypertensive chronic kidney disease with stage 5 chronic kidney disease or end stage renal disease: Secondary | ICD-10-CM | POA: Diagnosis not present

## 2015-05-28 DIAGNOSIS — R531 Weakness: Secondary | ICD-10-CM | POA: Diagnosis not present

## 2015-05-28 DIAGNOSIS — M1990 Unspecified osteoarthritis, unspecified site: Secondary | ICD-10-CM | POA: Diagnosis not present

## 2015-05-29 DIAGNOSIS — N2581 Secondary hyperparathyroidism of renal origin: Secondary | ICD-10-CM | POA: Diagnosis not present

## 2015-05-29 DIAGNOSIS — Z23 Encounter for immunization: Secondary | ICD-10-CM | POA: Diagnosis not present

## 2015-05-29 DIAGNOSIS — D631 Anemia in chronic kidney disease: Secondary | ICD-10-CM | POA: Diagnosis not present

## 2015-05-29 DIAGNOSIS — I10 Essential (primary) hypertension: Secondary | ICD-10-CM | POA: Diagnosis not present

## 2015-05-29 DIAGNOSIS — N186 End stage renal disease: Secondary | ICD-10-CM | POA: Diagnosis not present

## 2015-05-30 DIAGNOSIS — M1990 Unspecified osteoarthritis, unspecified site: Secondary | ICD-10-CM | POA: Diagnosis not present

## 2015-05-30 DIAGNOSIS — L89312 Pressure ulcer of right buttock, stage 2: Secondary | ICD-10-CM | POA: Diagnosis not present

## 2015-05-30 DIAGNOSIS — R531 Weakness: Secondary | ICD-10-CM | POA: Diagnosis not present

## 2015-05-30 DIAGNOSIS — N186 End stage renal disease: Secondary | ICD-10-CM | POA: Diagnosis not present

## 2015-05-30 DIAGNOSIS — E1122 Type 2 diabetes mellitus with diabetic chronic kidney disease: Secondary | ICD-10-CM | POA: Diagnosis not present

## 2015-05-30 DIAGNOSIS — I120 Hypertensive chronic kidney disease with stage 5 chronic kidney disease or end stage renal disease: Secondary | ICD-10-CM | POA: Diagnosis not present

## 2015-06-02 DIAGNOSIS — N2581 Secondary hyperparathyroidism of renal origin: Secondary | ICD-10-CM | POA: Diagnosis not present

## 2015-06-02 DIAGNOSIS — Z23 Encounter for immunization: Secondary | ICD-10-CM | POA: Diagnosis not present

## 2015-06-02 DIAGNOSIS — R531 Weakness: Secondary | ICD-10-CM | POA: Diagnosis not present

## 2015-06-02 DIAGNOSIS — L89312 Pressure ulcer of right buttock, stage 2: Secondary | ICD-10-CM | POA: Diagnosis not present

## 2015-06-02 DIAGNOSIS — M1990 Unspecified osteoarthritis, unspecified site: Secondary | ICD-10-CM | POA: Diagnosis not present

## 2015-06-02 DIAGNOSIS — D631 Anemia in chronic kidney disease: Secondary | ICD-10-CM | POA: Diagnosis not present

## 2015-06-02 DIAGNOSIS — I120 Hypertensive chronic kidney disease with stage 5 chronic kidney disease or end stage renal disease: Secondary | ICD-10-CM | POA: Diagnosis not present

## 2015-06-02 DIAGNOSIS — E1122 Type 2 diabetes mellitus with diabetic chronic kidney disease: Secondary | ICD-10-CM | POA: Diagnosis not present

## 2015-06-02 DIAGNOSIS — N186 End stage renal disease: Secondary | ICD-10-CM | POA: Diagnosis not present

## 2015-06-02 NOTE — Telephone Encounter (Signed)
Called patient to discuss below.  Was unable to reach him.  Left a message on home phone for call back.

## 2015-06-02 NOTE — Telephone Encounter (Signed)
Called and spoke with Pam from [place] regarding her assessment and concerns.   Notes she has assessed wound of buttocks -- no worsening. Is healing well. Endorses the issue is from where he is still not strong enough even with physical therapy, to be able to lift himself up to get to the restroom. States patient is found covered in stool, sometimes for 12 hours as he will not allow anyone but the RN to help with cleaning. Wife is not present from 9 AM to sometimes 9 PM as she is at work. Endorses wife is unwilling caregiver. Social work has gone to the house and has recommended SNF due to significant chronic issues, patient unwillingness to help himself improve, and unwilling caregiver at home. Patient has refused.  Please call patient to inform I have received report from nurse that they have many concerns regarding his health and non compliance with their recommendations. I do agree that he is in need of a SNF at least temporary for in house round-the-clock care and for more in-depth phyiscal therapy to increase his strength and endurance so that he can remain safely at home. He and I have discussed previously but he has declined. Unfortunately I will not be able to continue supervising his neglect of his care. If he is not willing to let us set up more skilled care for him then I will no longer be able to provide care.

## 2015-06-02 NOTE — Telephone Encounter (Signed)
Caller name: Olin Hauser w/[facility] Relationship to patient: Can be reached: [phone] Pharmacy:  Reason for call: Martin Majestic out to eval pt for [facility] nursing again for wound on buttocks and urostomy. She states pt and wife have been taught before on both and are very well versed. Nurse is not needed at this time. The pts wife works and is not in the home most of the day so the pt is essentially want help on and off the bed pan. Pt is not wanting to have the male PT help him on and off bed pan. Nursing cannot provide this function. Pt refused placement but was advised HH cannot be utilized for this function.

## 2015-06-03 DIAGNOSIS — N2581 Secondary hyperparathyroidism of renal origin: Secondary | ICD-10-CM | POA: Diagnosis not present

## 2015-06-03 DIAGNOSIS — K769 Liver disease, unspecified: Secondary | ICD-10-CM | POA: Diagnosis not present

## 2015-06-03 DIAGNOSIS — D631 Anemia in chronic kidney disease: Secondary | ICD-10-CM | POA: Diagnosis not present

## 2015-06-03 DIAGNOSIS — E1165 Type 2 diabetes mellitus with hyperglycemia: Secondary | ICD-10-CM | POA: Diagnosis not present

## 2015-06-03 DIAGNOSIS — N186 End stage renal disease: Secondary | ICD-10-CM | POA: Diagnosis not present

## 2015-06-03 DIAGNOSIS — Z23 Encounter for immunization: Secondary | ICD-10-CM | POA: Diagnosis not present

## 2015-06-03 DIAGNOSIS — E039 Hypothyroidism, unspecified: Secondary | ICD-10-CM | POA: Diagnosis not present

## 2015-06-03 NOTE — Telephone Encounter (Addendum)
Left a message for call back on both numbers listed for patient.

## 2015-06-03 NOTE — Assessment & Plan Note (Addendum)
Discussed history of noncompliance. Has been noncompliant with medication instructions, Diabetic eye exams, labs, antiplatelet therapy, etc.  A1C previously very well controlled despite multiple comorbid conditions and relatively no regard for managing his own health. I am limited in what I can do for him as a healthcare provider due to his unwillingness to follow instructions. He is agreeable to having labs updated through [place]. We have discussed issue in the past where he has requested labs through [facility] or Home Health RN but then refuses to have drawn when the time comes. Unfortunately I will not supervise his neglect of his healthcare. If he is not compliant with labs and further instructions he will be dismissed from our practice.

## 2015-06-04 DIAGNOSIS — E1122 Type 2 diabetes mellitus with diabetic chronic kidney disease: Secondary | ICD-10-CM | POA: Diagnosis not present

## 2015-06-04 DIAGNOSIS — N186 End stage renal disease: Secondary | ICD-10-CM | POA: Diagnosis not present

## 2015-06-04 DIAGNOSIS — R531 Weakness: Secondary | ICD-10-CM | POA: Diagnosis not present

## 2015-06-04 DIAGNOSIS — M1990 Unspecified osteoarthritis, unspecified site: Secondary | ICD-10-CM | POA: Diagnosis not present

## 2015-06-04 DIAGNOSIS — I120 Hypertensive chronic kidney disease with stage 5 chronic kidney disease or end stage renal disease: Secondary | ICD-10-CM | POA: Diagnosis not present

## 2015-06-04 DIAGNOSIS — L89312 Pressure ulcer of right buttock, stage 2: Secondary | ICD-10-CM | POA: Diagnosis not present

## 2015-06-04 NOTE — Telephone Encounter (Signed)
Called and left a message for call back  

## 2015-06-04 NOTE — Telephone Encounter (Addendum)
Pt returned call.  Provider's concerns discussed with patient.  He says he's not going to a skilled nursing facility.  That when he went to [place] before, he did not get any better.  He says he has started the admission process for [facility], but says he may not be admitted until July of this year.  In the meantime, he says he does not know where else to go, but will not be going to [facility]. He says PT will be coming out 3 days per week.  He's able to get himself on a bedpan and bedside commode, and his wife is able to provide wound care.

## 2015-06-05 DIAGNOSIS — Z23 Encounter for immunization: Secondary | ICD-10-CM | POA: Diagnosis not present

## 2015-06-05 DIAGNOSIS — N186 End stage renal disease: Secondary | ICD-10-CM | POA: Diagnosis not present

## 2015-06-05 DIAGNOSIS — D631 Anemia in chronic kidney disease: Secondary | ICD-10-CM | POA: Diagnosis not present

## 2015-06-05 DIAGNOSIS — N2581 Secondary hyperparathyroidism of renal origin: Secondary | ICD-10-CM | POA: Diagnosis not present

## 2015-06-07 DIAGNOSIS — M1990 Unspecified osteoarthritis, unspecified site: Secondary | ICD-10-CM | POA: Diagnosis not present

## 2015-06-07 DIAGNOSIS — E1122 Type 2 diabetes mellitus with diabetic chronic kidney disease: Secondary | ICD-10-CM | POA: Diagnosis not present

## 2015-06-07 DIAGNOSIS — R531 Weakness: Secondary | ICD-10-CM | POA: Diagnosis not present

## 2015-06-07 DIAGNOSIS — N186 End stage renal disease: Secondary | ICD-10-CM | POA: Diagnosis not present

## 2015-06-07 DIAGNOSIS — L89312 Pressure ulcer of right buttock, stage 2: Secondary | ICD-10-CM | POA: Diagnosis not present

## 2015-06-07 DIAGNOSIS — I120 Hypertensive chronic kidney disease with stage 5 chronic kidney disease or end stage renal disease: Secondary | ICD-10-CM | POA: Diagnosis not present

## 2015-06-07 NOTE — Telephone Encounter (Signed)
Received noted from PT at [facility] regarding two patient refusals for PT, one was from last week.

## 2015-06-08 DIAGNOSIS — N186 End stage renal disease: Secondary | ICD-10-CM | POA: Diagnosis not present

## 2015-06-08 DIAGNOSIS — D631 Anemia in chronic kidney disease: Secondary | ICD-10-CM | POA: Diagnosis not present

## 2015-06-08 DIAGNOSIS — Z23 Encounter for immunization: Secondary | ICD-10-CM | POA: Diagnosis not present

## 2015-06-08 DIAGNOSIS — N2581 Secondary hyperparathyroidism of renal origin: Secondary | ICD-10-CM | POA: Diagnosis not present

## 2015-06-10 DIAGNOSIS — D631 Anemia in chronic kidney disease: Secondary | ICD-10-CM | POA: Diagnosis not present

## 2015-06-10 DIAGNOSIS — N186 End stage renal disease: Secondary | ICD-10-CM | POA: Diagnosis not present

## 2015-06-10 DIAGNOSIS — Z23 Encounter for immunization: Secondary | ICD-10-CM | POA: Diagnosis not present

## 2015-06-10 DIAGNOSIS — N2581 Secondary hyperparathyroidism of renal origin: Secondary | ICD-10-CM | POA: Diagnosis not present

## 2015-06-12 DIAGNOSIS — N186 End stage renal disease: Secondary | ICD-10-CM | POA: Diagnosis not present

## 2015-06-12 DIAGNOSIS — N2581 Secondary hyperparathyroidism of renal origin: Secondary | ICD-10-CM | POA: Diagnosis not present

## 2015-06-12 DIAGNOSIS — D631 Anemia in chronic kidney disease: Secondary | ICD-10-CM | POA: Diagnosis not present

## 2015-06-12 DIAGNOSIS — Z23 Encounter for immunization: Secondary | ICD-10-CM | POA: Diagnosis not present

## 2015-06-14 ENCOUNTER — Telehealth: Payer: Self-pay | Admitting: *Deleted

## 2015-06-14 DIAGNOSIS — E1122 Type 2 diabetes mellitus with diabetic chronic kidney disease: Secondary | ICD-10-CM | POA: Diagnosis not present

## 2015-06-14 DIAGNOSIS — R531 Weakness: Secondary | ICD-10-CM | POA: Diagnosis not present

## 2015-06-14 DIAGNOSIS — N186 End stage renal disease: Secondary | ICD-10-CM | POA: Diagnosis not present

## 2015-06-14 DIAGNOSIS — L89312 Pressure ulcer of right buttock, stage 2: Secondary | ICD-10-CM | POA: Diagnosis not present

## 2015-06-14 DIAGNOSIS — M1990 Unspecified osteoarthritis, unspecified site: Secondary | ICD-10-CM | POA: Diagnosis not present

## 2015-06-14 DIAGNOSIS — I120 Hypertensive chronic kidney disease with stage 5 chronic kidney disease or end stage renal disease: Secondary | ICD-10-CM | POA: Diagnosis not present

## 2015-06-14 NOTE — Telephone Encounter (Signed)
Forwarded to Cody. JG//CMA 

## 2015-06-15 ENCOUNTER — Encounter: Payer: Self-pay | Admitting: Physician Assistant

## 2015-06-15 DIAGNOSIS — N186 End stage renal disease: Secondary | ICD-10-CM | POA: Diagnosis not present

## 2015-06-15 DIAGNOSIS — D631 Anemia in chronic kidney disease: Secondary | ICD-10-CM | POA: Diagnosis not present

## 2015-06-15 DIAGNOSIS — Z23 Encounter for immunization: Secondary | ICD-10-CM | POA: Diagnosis not present

## 2015-06-15 DIAGNOSIS — N2581 Secondary hyperparathyroidism of renal origin: Secondary | ICD-10-CM | POA: Diagnosis not present

## 2015-06-15 NOTE — Telephone Encounter (Signed)
Received another note from PT at [facility] for 2 additional missed visits -- 06/09/15 (Patient refused) and 06/11/15 (Patient was in bed and declined his scheduled appointment).  This is 4 refused/missed visits in the past couple of weeks.  Again I cannot supervise his neglect of his health. He is refusing PT, sitting in stool all day, not trying to work on improving strength so he can take care of himself. He has previously refused blood thinners despite history of multiple DVT and PE along with the fact that he is sitting most all day and is at increased risk for clots.  He will have to be dismissed from the practice.

## 2015-06-16 DIAGNOSIS — N186 End stage renal disease: Secondary | ICD-10-CM | POA: Diagnosis not present

## 2015-06-16 DIAGNOSIS — R531 Weakness: Secondary | ICD-10-CM | POA: Diagnosis not present

## 2015-06-16 DIAGNOSIS — E1122 Type 2 diabetes mellitus with diabetic chronic kidney disease: Secondary | ICD-10-CM | POA: Diagnosis not present

## 2015-06-16 DIAGNOSIS — L89312 Pressure ulcer of right buttock, stage 2: Secondary | ICD-10-CM | POA: Diagnosis not present

## 2015-06-16 DIAGNOSIS — I120 Hypertensive chronic kidney disease with stage 5 chronic kidney disease or end stage renal disease: Secondary | ICD-10-CM | POA: Diagnosis not present

## 2015-06-16 DIAGNOSIS — M1990 Unspecified osteoarthritis, unspecified site: Secondary | ICD-10-CM | POA: Diagnosis not present

## 2015-06-17 DIAGNOSIS — Z23 Encounter for immunization: Secondary | ICD-10-CM | POA: Diagnosis not present

## 2015-06-17 DIAGNOSIS — D631 Anemia in chronic kidney disease: Secondary | ICD-10-CM | POA: Diagnosis not present

## 2015-06-17 DIAGNOSIS — N186 End stage renal disease: Secondary | ICD-10-CM | POA: Diagnosis not present

## 2015-06-17 DIAGNOSIS — N2581 Secondary hyperparathyroidism of renal origin: Secondary | ICD-10-CM | POA: Diagnosis not present

## 2015-06-18 DIAGNOSIS — E1122 Type 2 diabetes mellitus with diabetic chronic kidney disease: Secondary | ICD-10-CM | POA: Diagnosis not present

## 2015-06-18 DIAGNOSIS — M1990 Unspecified osteoarthritis, unspecified site: Secondary | ICD-10-CM | POA: Diagnosis not present

## 2015-06-18 DIAGNOSIS — R531 Weakness: Secondary | ICD-10-CM | POA: Diagnosis not present

## 2015-06-18 DIAGNOSIS — N186 End stage renal disease: Secondary | ICD-10-CM | POA: Diagnosis not present

## 2015-06-18 DIAGNOSIS — I120 Hypertensive chronic kidney disease with stage 5 chronic kidney disease or end stage renal disease: Secondary | ICD-10-CM | POA: Diagnosis not present

## 2015-06-18 DIAGNOSIS — L89312 Pressure ulcer of right buttock, stage 2: Secondary | ICD-10-CM | POA: Diagnosis not present

## 2015-06-19 DIAGNOSIS — N186 End stage renal disease: Secondary | ICD-10-CM | POA: Diagnosis not present

## 2015-06-19 DIAGNOSIS — Z23 Encounter for immunization: Secondary | ICD-10-CM | POA: Diagnosis not present

## 2015-06-19 DIAGNOSIS — N2581 Secondary hyperparathyroidism of renal origin: Secondary | ICD-10-CM | POA: Diagnosis not present

## 2015-06-19 DIAGNOSIS — D631 Anemia in chronic kidney disease: Secondary | ICD-10-CM | POA: Diagnosis not present

## 2015-06-21 DIAGNOSIS — L89312 Pressure ulcer of right buttock, stage 2: Secondary | ICD-10-CM | POA: Diagnosis not present

## 2015-06-21 DIAGNOSIS — N186 End stage renal disease: Secondary | ICD-10-CM | POA: Diagnosis not present

## 2015-06-21 DIAGNOSIS — E1122 Type 2 diabetes mellitus with diabetic chronic kidney disease: Secondary | ICD-10-CM | POA: Diagnosis not present

## 2015-06-21 DIAGNOSIS — R531 Weakness: Secondary | ICD-10-CM | POA: Diagnosis not present

## 2015-06-21 DIAGNOSIS — M1990 Unspecified osteoarthritis, unspecified site: Secondary | ICD-10-CM | POA: Diagnosis not present

## 2015-06-21 DIAGNOSIS — I120 Hypertensive chronic kidney disease with stage 5 chronic kidney disease or end stage renal disease: Secondary | ICD-10-CM | POA: Diagnosis not present

## 2015-06-22 DIAGNOSIS — Z23 Encounter for immunization: Secondary | ICD-10-CM | POA: Diagnosis not present

## 2015-06-22 DIAGNOSIS — D631 Anemia in chronic kidney disease: Secondary | ICD-10-CM | POA: Diagnosis not present

## 2015-06-22 DIAGNOSIS — N2581 Secondary hyperparathyroidism of renal origin: Secondary | ICD-10-CM | POA: Diagnosis not present

## 2015-06-22 DIAGNOSIS — N186 End stage renal disease: Secondary | ICD-10-CM | POA: Diagnosis not present

## 2015-06-24 DIAGNOSIS — Z23 Encounter for immunization: Secondary | ICD-10-CM | POA: Diagnosis not present

## 2015-06-24 DIAGNOSIS — D631 Anemia in chronic kidney disease: Secondary | ICD-10-CM | POA: Diagnosis not present

## 2015-06-24 DIAGNOSIS — N186 End stage renal disease: Secondary | ICD-10-CM | POA: Diagnosis not present

## 2015-06-24 DIAGNOSIS — N2581 Secondary hyperparathyroidism of renal origin: Secondary | ICD-10-CM | POA: Diagnosis not present

## 2015-06-26 DIAGNOSIS — N186 End stage renal disease: Secondary | ICD-10-CM | POA: Diagnosis not present

## 2015-06-26 DIAGNOSIS — N2581 Secondary hyperparathyroidism of renal origin: Secondary | ICD-10-CM | POA: Diagnosis not present

## 2015-06-26 DIAGNOSIS — D631 Anemia in chronic kidney disease: Secondary | ICD-10-CM | POA: Diagnosis not present

## 2015-06-26 DIAGNOSIS — Z23 Encounter for immunization: Secondary | ICD-10-CM | POA: Diagnosis not present

## 2015-06-28 DIAGNOSIS — D631 Anemia in chronic kidney disease: Secondary | ICD-10-CM | POA: Diagnosis not present

## 2015-06-28 DIAGNOSIS — N186 End stage renal disease: Secondary | ICD-10-CM | POA: Diagnosis not present

## 2015-06-28 DIAGNOSIS — I10 Essential (primary) hypertension: Secondary | ICD-10-CM | POA: Diagnosis not present

## 2015-06-28 DIAGNOSIS — E1122 Type 2 diabetes mellitus with diabetic chronic kidney disease: Secondary | ICD-10-CM | POA: Diagnosis not present

## 2015-06-28 DIAGNOSIS — I120 Hypertensive chronic kidney disease with stage 5 chronic kidney disease or end stage renal disease: Secondary | ICD-10-CM | POA: Diagnosis not present

## 2015-06-28 DIAGNOSIS — M1990 Unspecified osteoarthritis, unspecified site: Secondary | ICD-10-CM | POA: Diagnosis not present

## 2015-06-28 DIAGNOSIS — R531 Weakness: Secondary | ICD-10-CM | POA: Diagnosis not present

## 2015-06-28 DIAGNOSIS — L89312 Pressure ulcer of right buttock, stage 2: Secondary | ICD-10-CM | POA: Diagnosis not present

## 2015-06-29 DIAGNOSIS — D631 Anemia in chronic kidney disease: Secondary | ICD-10-CM | POA: Diagnosis not present

## 2015-06-29 DIAGNOSIS — N186 End stage renal disease: Secondary | ICD-10-CM | POA: Diagnosis not present

## 2015-07-01 DIAGNOSIS — K769 Liver disease, unspecified: Secondary | ICD-10-CM | POA: Diagnosis not present

## 2015-07-01 DIAGNOSIS — N186 End stage renal disease: Secondary | ICD-10-CM | POA: Diagnosis not present

## 2015-07-01 DIAGNOSIS — D631 Anemia in chronic kidney disease: Secondary | ICD-10-CM | POA: Diagnosis not present

## 2015-07-01 DIAGNOSIS — E1165 Type 2 diabetes mellitus with hyperglycemia: Secondary | ICD-10-CM | POA: Diagnosis not present

## 2015-07-02 ENCOUNTER — Other Ambulatory Visit: Payer: Self-pay | Admitting: Physician Assistant

## 2015-07-02 DIAGNOSIS — N186 End stage renal disease: Secondary | ICD-10-CM | POA: Diagnosis not present

## 2015-07-02 DIAGNOSIS — R531 Weakness: Secondary | ICD-10-CM | POA: Diagnosis not present

## 2015-07-02 DIAGNOSIS — L89312 Pressure ulcer of right buttock, stage 2: Secondary | ICD-10-CM | POA: Diagnosis not present

## 2015-07-02 DIAGNOSIS — M1990 Unspecified osteoarthritis, unspecified site: Secondary | ICD-10-CM | POA: Diagnosis not present

## 2015-07-02 DIAGNOSIS — E1122 Type 2 diabetes mellitus with diabetic chronic kidney disease: Secondary | ICD-10-CM | POA: Diagnosis not present

## 2015-07-02 DIAGNOSIS — I120 Hypertensive chronic kidney disease with stage 5 chronic kidney disease or end stage renal disease: Secondary | ICD-10-CM | POA: Diagnosis not present

## 2015-07-02 NOTE — Telephone Encounter (Signed)
Cody, please advise. I see documentation that pt will be dismissed, but unsure if the dismissal process has been started or completed for this patient. Thanks.

## 2015-07-04 NOTE — Telephone Encounter (Signed)
Ok to send in refill. Please verify dosing with patient.  He will be transitioning to [facility] in June so I am postponing the dismissal process for now since he will be transferring care. (Dismissal was for repetitive non-compliance and inability to form therapeutic relationship).

## 2015-07-05 DIAGNOSIS — N186 End stage renal disease: Secondary | ICD-10-CM | POA: Diagnosis not present

## 2015-07-05 DIAGNOSIS — D631 Anemia in chronic kidney disease: Secondary | ICD-10-CM | POA: Diagnosis not present

## 2015-07-05 NOTE — Telephone Encounter (Signed)
Medication filled to pharmacy as requested. Called pt and LMOM to return call to verify dosing.

## 2015-07-07 ENCOUNTER — Telehealth: Payer: Self-pay | Admitting: Physician Assistant

## 2015-07-07 DIAGNOSIS — R531 Weakness: Secondary | ICD-10-CM | POA: Diagnosis not present

## 2015-07-07 DIAGNOSIS — M1990 Unspecified osteoarthritis, unspecified site: Secondary | ICD-10-CM | POA: Diagnosis not present

## 2015-07-07 DIAGNOSIS — L89312 Pressure ulcer of right buttock, stage 2: Secondary | ICD-10-CM | POA: Diagnosis not present

## 2015-07-07 DIAGNOSIS — E1122 Type 2 diabetes mellitus with diabetic chronic kidney disease: Secondary | ICD-10-CM | POA: Diagnosis not present

## 2015-07-07 DIAGNOSIS — I120 Hypertensive chronic kidney disease with stage 5 chronic kidney disease or end stage renal disease: Secondary | ICD-10-CM | POA: Diagnosis not present

## 2015-07-07 DIAGNOSIS — N186 End stage renal disease: Secondary | ICD-10-CM | POA: Diagnosis not present

## 2015-07-07 NOTE — Telephone Encounter (Signed)
Caller name: Anderson Malta w/[facility] Can be reached: [phone]   Reason for call: in home with PT, they would like orders to extend [facility] PT 2x week for 2 weeks starting next week, please call with VO, ok to leave on VM

## 2015-07-07 NOTE — Telephone Encounter (Signed)
Called and [facility] for [name] with [facility] @ 10:39am @ [phone]) given her verbal orders per [facility] for the extend [facility] PT 2x week for 2 weeks starting next week.  Asked [name] to give me a call back if she has any questions are concerns.//AB/CMA

## 2015-07-07 NOTE — Telephone Encounter (Signed)
Ok to call and give verbal order.

## 2015-07-08 DIAGNOSIS — D631 Anemia in chronic kidney disease: Secondary | ICD-10-CM | POA: Diagnosis not present

## 2015-07-08 DIAGNOSIS — N186 End stage renal disease: Secondary | ICD-10-CM | POA: Diagnosis not present

## 2015-07-09 DIAGNOSIS — N186 End stage renal disease: Secondary | ICD-10-CM | POA: Diagnosis not present

## 2015-07-09 DIAGNOSIS — M1990 Unspecified osteoarthritis, unspecified site: Secondary | ICD-10-CM | POA: Diagnosis not present

## 2015-07-09 DIAGNOSIS — E1122 Type 2 diabetes mellitus with diabetic chronic kidney disease: Secondary | ICD-10-CM | POA: Diagnosis not present

## 2015-07-09 DIAGNOSIS — L89312 Pressure ulcer of right buttock, stage 2: Secondary | ICD-10-CM | POA: Diagnosis not present

## 2015-07-09 DIAGNOSIS — R531 Weakness: Secondary | ICD-10-CM | POA: Diagnosis not present

## 2015-07-09 DIAGNOSIS — I120 Hypertensive chronic kidney disease with stage 5 chronic kidney disease or end stage renal disease: Secondary | ICD-10-CM | POA: Diagnosis not present

## 2015-07-10 DIAGNOSIS — N186 End stage renal disease: Secondary | ICD-10-CM | POA: Diagnosis not present

## 2015-07-10 DIAGNOSIS — D631 Anemia in chronic kidney disease: Secondary | ICD-10-CM | POA: Diagnosis not present

## 2015-07-13 DIAGNOSIS — N186 End stage renal disease: Secondary | ICD-10-CM | POA: Diagnosis not present

## 2015-07-13 DIAGNOSIS — D631 Anemia in chronic kidney disease: Secondary | ICD-10-CM | POA: Diagnosis not present

## 2015-07-14 DIAGNOSIS — E1122 Type 2 diabetes mellitus with diabetic chronic kidney disease: Secondary | ICD-10-CM | POA: Diagnosis not present

## 2015-07-14 DIAGNOSIS — M1990 Unspecified osteoarthritis, unspecified site: Secondary | ICD-10-CM | POA: Diagnosis not present

## 2015-07-14 DIAGNOSIS — L89312 Pressure ulcer of right buttock, stage 2: Secondary | ICD-10-CM | POA: Diagnosis not present

## 2015-07-14 DIAGNOSIS — R531 Weakness: Secondary | ICD-10-CM | POA: Diagnosis not present

## 2015-07-14 DIAGNOSIS — I120 Hypertensive chronic kidney disease with stage 5 chronic kidney disease or end stage renal disease: Secondary | ICD-10-CM | POA: Diagnosis not present

## 2015-07-14 DIAGNOSIS — N186 End stage renal disease: Secondary | ICD-10-CM | POA: Diagnosis not present

## 2015-07-15 DIAGNOSIS — L89312 Pressure ulcer of right buttock, stage 2: Secondary | ICD-10-CM | POA: Diagnosis not present

## 2015-07-15 DIAGNOSIS — R531 Weakness: Secondary | ICD-10-CM | POA: Diagnosis not present

## 2015-07-15 DIAGNOSIS — I120 Hypertensive chronic kidney disease with stage 5 chronic kidney disease or end stage renal disease: Secondary | ICD-10-CM | POA: Diagnosis not present

## 2015-07-15 DIAGNOSIS — E1122 Type 2 diabetes mellitus with diabetic chronic kidney disease: Secondary | ICD-10-CM | POA: Diagnosis not present

## 2015-07-15 DIAGNOSIS — N186 End stage renal disease: Secondary | ICD-10-CM | POA: Diagnosis not present

## 2015-07-15 DIAGNOSIS — M1990 Unspecified osteoarthritis, unspecified site: Secondary | ICD-10-CM | POA: Diagnosis not present

## 2015-07-16 DIAGNOSIS — N186 End stage renal disease: Secondary | ICD-10-CM | POA: Diagnosis not present

## 2015-07-16 DIAGNOSIS — D631 Anemia in chronic kidney disease: Secondary | ICD-10-CM | POA: Diagnosis not present

## 2015-07-17 DIAGNOSIS — N186 End stage renal disease: Secondary | ICD-10-CM | POA: Diagnosis not present

## 2015-07-17 DIAGNOSIS — D631 Anemia in chronic kidney disease: Secondary | ICD-10-CM | POA: Diagnosis not present

## 2015-07-21 ENCOUNTER — Telehealth: Payer: Self-pay | Admitting: Physician Assistant

## 2015-07-21 DIAGNOSIS — N186 End stage renal disease: Secondary | ICD-10-CM | POA: Diagnosis not present

## 2015-07-21 DIAGNOSIS — D631 Anemia in chronic kidney disease: Secondary | ICD-10-CM | POA: Diagnosis not present

## 2015-07-21 NOTE — Telephone Encounter (Signed)
Caller name:Jenniffer  Relation to pt: PT from [place]  Call back number: [phone]   Reason for call:  Physical Therapist wanted to inform PCP patient has reached he's maxium potential and will be discharged. Patient transfer and gait training is successful. Physical Therapist states patient will regress because he doesn't have a home health aid attending with assistance when needed.

## 2015-07-22 DIAGNOSIS — M1990 Unspecified osteoarthritis, unspecified site: Secondary | ICD-10-CM | POA: Diagnosis not present

## 2015-07-22 DIAGNOSIS — E1122 Type 2 diabetes mellitus with diabetic chronic kidney disease: Secondary | ICD-10-CM | POA: Diagnosis not present

## 2015-07-22 DIAGNOSIS — D631 Anemia in chronic kidney disease: Secondary | ICD-10-CM | POA: Diagnosis not present

## 2015-07-22 DIAGNOSIS — I120 Hypertensive chronic kidney disease with stage 5 chronic kidney disease or end stage renal disease: Secondary | ICD-10-CM | POA: Diagnosis not present

## 2015-07-22 DIAGNOSIS — N186 End stage renal disease: Secondary | ICD-10-CM | POA: Diagnosis not present

## 2015-07-22 DIAGNOSIS — L89312 Pressure ulcer of right buttock, stage 2: Secondary | ICD-10-CM | POA: Diagnosis not present

## 2015-07-22 DIAGNOSIS — R531 Weakness: Secondary | ICD-10-CM | POA: Diagnosis not present

## 2015-07-22 NOTE — Telephone Encounter (Signed)
Thank you for making me aware.  Please call therapist back and thank him for the input. I have sent home health aides and RN to the home multiple times previously with patient refusing services and being non compliant with there care. I have recommended Assisted Living and SNF for continued care but patient has refused. It will be up to the patient from this point forward to decide if he will agree to further care.

## 2015-07-22 NOTE — Telephone Encounter (Signed)
Called PT back and offered our appreciation.  She was very appreciative for the call and stated she wish there was more that we could do.

## 2015-07-23 DIAGNOSIS — E1122 Type 2 diabetes mellitus with diabetic chronic kidney disease: Secondary | ICD-10-CM | POA: Diagnosis not present

## 2015-07-23 DIAGNOSIS — R531 Weakness: Secondary | ICD-10-CM | POA: Diagnosis not present

## 2015-07-23 DIAGNOSIS — I120 Hypertensive chronic kidney disease with stage 5 chronic kidney disease or end stage renal disease: Secondary | ICD-10-CM | POA: Diagnosis not present

## 2015-07-23 DIAGNOSIS — M1990 Unspecified osteoarthritis, unspecified site: Secondary | ICD-10-CM | POA: Diagnosis not present

## 2015-07-23 DIAGNOSIS — N186 End stage renal disease: Secondary | ICD-10-CM | POA: Diagnosis not present

## 2015-07-23 DIAGNOSIS — L89312 Pressure ulcer of right buttock, stage 2: Secondary | ICD-10-CM | POA: Diagnosis not present

## 2015-07-23 DIAGNOSIS — Z992 Dependence on renal dialysis: Secondary | ICD-10-CM | POA: Diagnosis not present

## 2015-07-24 DIAGNOSIS — N186 End stage renal disease: Secondary | ICD-10-CM | POA: Diagnosis not present

## 2015-07-24 DIAGNOSIS — D631 Anemia in chronic kidney disease: Secondary | ICD-10-CM | POA: Diagnosis not present

## 2015-07-27 DIAGNOSIS — D631 Anemia in chronic kidney disease: Secondary | ICD-10-CM | POA: Diagnosis not present

## 2015-07-27 DIAGNOSIS — N186 End stage renal disease: Secondary | ICD-10-CM | POA: Diagnosis not present

## 2015-07-28 ENCOUNTER — Telehealth: Payer: Self-pay | Admitting: Physician Assistant

## 2015-07-28 ENCOUNTER — Ambulatory Visit: Payer: Medicare Other | Admitting: Physician Assistant

## 2015-07-28 NOTE — Telephone Encounter (Signed)
No charge. 

## 2015-07-28 NOTE — Telephone Encounter (Signed)
Pt will not be here for 3:00pm appt, father was bringing him but now he can't, pt does not have a ride, charge or no charge?

## 2015-07-29 DIAGNOSIS — D631 Anemia in chronic kidney disease: Secondary | ICD-10-CM | POA: Diagnosis not present

## 2015-07-29 DIAGNOSIS — I10 Essential (primary) hypertension: Secondary | ICD-10-CM | POA: Diagnosis not present

## 2015-07-29 DIAGNOSIS — N186 End stage renal disease: Secondary | ICD-10-CM | POA: Diagnosis not present

## 2015-07-31 DIAGNOSIS — D631 Anemia in chronic kidney disease: Secondary | ICD-10-CM | POA: Diagnosis not present

## 2015-07-31 DIAGNOSIS — N186 End stage renal disease: Secondary | ICD-10-CM | POA: Diagnosis not present

## 2015-08-01 ENCOUNTER — Other Ambulatory Visit: Payer: Self-pay | Admitting: Physician Assistant

## 2015-08-02 NOTE — Telephone Encounter (Signed)
Rx request to pharmacy/SLS Requested drug refills are authorized 30-day Only, however, the patient needs further evaluation and/or laboratory testing before further refills are given. Ask him to make an appointment for this.

## 2015-08-03 DIAGNOSIS — N186 End stage renal disease: Secondary | ICD-10-CM | POA: Diagnosis not present

## 2015-08-03 DIAGNOSIS — D631 Anemia in chronic kidney disease: Secondary | ICD-10-CM | POA: Diagnosis not present

## 2015-08-04 ENCOUNTER — Ambulatory Visit (INDEPENDENT_AMBULATORY_CARE_PROVIDER_SITE_OTHER): Payer: Medicare Other | Admitting: Physician Assistant

## 2015-08-04 ENCOUNTER — Encounter: Payer: Self-pay | Admitting: Physician Assistant

## 2015-08-04 VITALS — BP 123/77 | HR 78 | Temp 98.0°F | Resp 16 | Ht 71.0 in | Wt 259.0 lb

## 2015-08-04 DIAGNOSIS — E1140 Type 2 diabetes mellitus with diabetic neuropathy, unspecified: Secondary | ICD-10-CM | POA: Diagnosis not present

## 2015-08-04 DIAGNOSIS — E039 Hypothyroidism, unspecified: Secondary | ICD-10-CM | POA: Diagnosis not present

## 2015-08-04 DIAGNOSIS — Z89512 Acquired absence of left leg below knee: Secondary | ICD-10-CM | POA: Diagnosis not present

## 2015-08-04 DIAGNOSIS — I10 Essential (primary) hypertension: Secondary | ICD-10-CM | POA: Diagnosis not present

## 2015-08-04 DIAGNOSIS — Z794 Long term (current) use of insulin: Secondary | ICD-10-CM

## 2015-08-04 NOTE — Progress Notes (Signed)
Pre visit review using our clinic review tool, if applicable. No additional management support is needed unless otherwise documented below in the visit note/SLS  

## 2015-08-04 NOTE — Patient Instructions (Signed)
I will call around to see about outpatient Physical therapy and transportation while we are waiting on [facility] for you.  Please continue medications as directed.  Reconsider letting me setting up home health for you. You absolutely need it. As discussed before I recommend Assistant Living or a more skilled facility for you but you have refuses. Please reconsider.  I will send orders to Nephrology for blood work to draw at dialysis. Follow-up will be based on those results.

## 2015-08-05 DIAGNOSIS — D631 Anemia in chronic kidney disease: Secondary | ICD-10-CM | POA: Diagnosis not present

## 2015-08-05 DIAGNOSIS — K769 Liver disease, unspecified: Secondary | ICD-10-CM | POA: Diagnosis not present

## 2015-08-05 DIAGNOSIS — E1165 Type 2 diabetes mellitus with hyperglycemia: Secondary | ICD-10-CM | POA: Diagnosis not present

## 2015-08-05 DIAGNOSIS — N186 End stage renal disease: Secondary | ICD-10-CM | POA: Diagnosis not present

## 2015-08-07 DIAGNOSIS — N186 End stage renal disease: Secondary | ICD-10-CM | POA: Diagnosis not present

## 2015-08-07 DIAGNOSIS — D631 Anemia in chronic kidney disease: Secondary | ICD-10-CM | POA: Diagnosis not present

## 2015-08-09 NOTE — Progress Notes (Signed)
Patient presents to clinic today for follow-up of diabetes. Is also here so that we can have another discussion regarding ability to stay safely at home.  Diabetes -- patient currently on Lantus 40 units daily. Endorses taking as directed. Has a sliding scale regimen for NovoLog. States he rarely has to use it. Patient with history of diabetic neuropathy, nephropathy and retinopathy. Is followed in nephrology. Is still having dialysis 3 times a week. States he wishes he could get a kidney. Is tired of being on dialysis buthe has no other option. Neuropathy is controlled with regimen of gabapentin and fentanyl. Patient has a right lower extremity prosthetic for his DKA. Does not wear. Has previously underwent physical therapy via home health. States this helped a lot but he is feeling weak again. Was also followed by skilled nursing with home health, to discontinue care due to patient noncompliance.  Having previous reports from skilled nursing revealing that patient is at home alone for 12 hours a day, while wife is at work. Patient is not getting himself up to use the toilet, but using a bedpan. RNs have noted that they have found patient still lying on bed for hours after use as he was unable to get off of it.   Past Medical History  Diagnosis Date  . Sleep apnea     uses cpap  . Hypothyroidism   . Anemia   . Blood transfusion   . Chronic kidney disease     hx of kidney stones  . Hypertension   . Arthritis     osteoarthritis  . Pneumonia   . Diabetes mellitus     insulin dependent  . Peripheral vascular disease ([place])   . Hyperlipidemia   . Morbid obesity ([place])   . Hypertrophy of prostate   . Acute respiratory failure ([place])   . Cellulitis and abscess of leg   . Chronic kidney disease, stage III (moderate)   . Nephrolithiasis   . Anxiety   . Chronic pain   . Embolism and thrombosis of unspecified artery ([place])   . DVT (deep venous thrombosis) ([facility])     Lower Extremity  .  Personal history of arthritis     Osteoarthritis  . History of nephrolithiasis     Bilateral  . AR (allergic rhinitis)   . BPH (benign prostatic hypertrophy)   . Displacement of lumbar intervertebral disc without myelopathy     L4-L5  . Cholelithiasis   . Other lymphedema   . Unspecified septicemia   . Impotence of organic origin   . Pancreatitis, acute   . Diverticulosis of colon   . Gallstone     Current Outpatient Prescriptions on File Prior to Visit  Medication Sig Dispense Refill  . atorvastatin (LIPITOR) 40 MG tablet Take 1 tablet (40 mg total) by mouth daily. 90 tablet 1  . fentaNYL (DURAGESIC - DOSED MCG/HR) 50 MCG/HR Place 1 patch (50 mcg total) onto the skin every 3 (three) days. 10 patch 0  . ferrous sulfate 325 (65 FE) MG tablet Take 1 tablet (325 mg total) by mouth 2 (two) times daily. 180 tablet 1  . glucose blood (BAYER CONTOUR TEST) test strip USE AS DIRECTED TO CHECK BLOOD SUGARS TWICE DAILY Dx: E11.9 100 each 3  . insulin aspart (NOVOLOG) 100 UNIT/ML injection INJECT 3 UNITS THREE TIMES DAILY WITH MEALS. AND AT NIGHTIME IF BLOOD SUGARS GREATER THAN 150 DX: E11.29 40 mL 1  . Insulin Syringe-Needle U-100 (B-D INS SYRINGE 0.5CC/31GX5/16)  31G X 5/16" 0.5 ML MISC USE 4 TIMES A DAY FOR DIABETES BLOOD GLUCOSE TESTING DX: E11.29 300 each 1  . LANTUS 100 UNIT/ML injection INJECT 0.4 MLS (40 UNITS TOTAL) INTO THE SKIN AT BEDTIME. 120 mL 0  . levothyroxine (SYNTHROID, LEVOTHROID) 200 MCG tablet TAKE 1 TABLET (200MG  TOTAL) BY MOUTH EVERY MORNING 30 tablet 0  . levothyroxine (SYNTHROID, LEVOTHROID) 25 MCG tablet TAKE 1 TABLET (25 MCG TOTAL) BY MOUTH DAILY BEFORE BREAKFAST. 90 tablet 1  . Multiple Vitamins-Minerals (MULTIVITAMINS THER. W/MINERALS) TABS Take 1 tablet by mouth daily.     . sevelamer carbonate (RENVELA) 800 MG tablet Take 800 mg by mouth 3 (three) times daily with meals.     No current facility-administered medications on file prior to visit.    No Known  Allergies  Family History  Problem Relation Age of Onset  . Diabetes Mother   . Heart attack Mother   . Stroke Mother   . Colon cancer Father   . Dementia Father   . Heart attack Father   . Arthritis Paternal Grandmother   . Hypertension Other   . Hypothyroidism Other     Social History   Social History  . Marital Status: Married    Spouse Name: N/A  . Number of Children: N/A  . Years of Education: N/A   Social History Main Topics  . Smoking status: Never Smoker   . Smokeless tobacco: Never Used  . Alcohol Use: No  . Drug Use: No  . Sexual Activity: Yes   Other Topics Concern  . None   Social History Narrative   Lives with his wife and uses a wheelchair for transfers.      Review of Systems - See HPI.  All other ROS are negative.  BP 123/77 mmHg  Pulse 78  Temp(Src) 98 F (36.7 C) (Oral)  Resp 16  Ht 5\' 11"  (1.803 m)  Wt 259 lb (117.482 kg)  BMI 36.14 kg/m2  SpO2 100%  Physical Exam  Constitutional: He is oriented to person, place, and time and well-developed, well-nourished, and in no distress.  HENT:  Head: Normocephalic and atraumatic.  Eyes: Conjunctivae are normal. Pupils are equal, round, and reactive to light.  Neck: Neck supple.  Cardiovascular: Normal rate, regular rhythm, normal heart sounds and intact distal pulses.   Pulmonary/Chest: Effort normal and breath sounds normal. No respiratory distress. He has no wheezes. He has no rales. He exhibits no tenderness.  Neurological: He is alert and oriented to person, place, and time.  Skin: Skin is warm and dry. No rash noted.  Psychiatric: Affect normal.    No results found for this or any previous visit (from the past 2160 hour(s)).  Assessment/Plan: Essential hypertension BP well controlled. Managed by Nephrology. Will request recent labs.  Hypothyroidism Endorses taking medications as directed. Will have TSH drawn at dialysis as patient refuses lab draw here at the office  Diabetes  mellitus type II, controlled Last A1C at 5.3. Discussed again the need to wean down medication to prevent hypoglycemia giving such a low A1C. Will try to stop Mealtime insulin. Titration for Lantus discussed. Patient endorses understanding but there is a significant history of non-compliance. Patient to be starting [facility] this month who will take over further management.   Amputated below knee Patient recently discharged from home PT. Concern that patient would deteriorate again if left at home. This was also concern from home health RN. Patient has refused care mutliple times. Reports of him  sitting in stool for up to 12 hours while waitin on wife to come home as he refuses to wear prosthetic or to use the bedside commode. Patient states this is false that he only uses the bedpan sometimes. When questioned further about safety at home, it does seem that he is sitting most of the day, sometimes in stool if unable to get on the bed pan or get off of the bed pan without making a mess. States he refuses to consider ALF or SNF. I am highly concerned for patient safety and discussed with him that I cannot supervise the neglect of his care. He does agree to outpatient PT which I feel will be of more benefit than home PT. He is starting [facility] this month who will be able to give him more access to services and transportation to those services.

## 2015-08-10 DIAGNOSIS — D631 Anemia in chronic kidney disease: Secondary | ICD-10-CM | POA: Diagnosis not present

## 2015-08-10 DIAGNOSIS — N186 End stage renal disease: Secondary | ICD-10-CM | POA: Diagnosis not present

## 2015-08-12 DIAGNOSIS — D631 Anemia in chronic kidney disease: Secondary | ICD-10-CM | POA: Diagnosis not present

## 2015-08-12 DIAGNOSIS — N186 End stage renal disease: Secondary | ICD-10-CM | POA: Diagnosis not present

## 2015-08-14 DIAGNOSIS — N186 End stage renal disease: Secondary | ICD-10-CM | POA: Diagnosis not present

## 2015-08-14 DIAGNOSIS — D631 Anemia in chronic kidney disease: Secondary | ICD-10-CM | POA: Diagnosis not present

## 2015-08-17 DIAGNOSIS — D631 Anemia in chronic kidney disease: Secondary | ICD-10-CM | POA: Diagnosis not present

## 2015-08-17 DIAGNOSIS — N186 End stage renal disease: Secondary | ICD-10-CM | POA: Diagnosis not present

## 2015-08-19 DIAGNOSIS — D631 Anemia in chronic kidney disease: Secondary | ICD-10-CM | POA: Diagnosis not present

## 2015-08-19 DIAGNOSIS — N186 End stage renal disease: Secondary | ICD-10-CM | POA: Diagnosis not present

## 2015-08-19 DIAGNOSIS — E1165 Type 2 diabetes mellitus with hyperglycemia: Secondary | ICD-10-CM | POA: Diagnosis not present

## 2015-08-21 DIAGNOSIS — D631 Anemia in chronic kidney disease: Secondary | ICD-10-CM | POA: Diagnosis not present

## 2015-08-21 DIAGNOSIS — N186 End stage renal disease: Secondary | ICD-10-CM | POA: Diagnosis not present

## 2015-08-24 DIAGNOSIS — D631 Anemia in chronic kidney disease: Secondary | ICD-10-CM | POA: Diagnosis not present

## 2015-08-24 DIAGNOSIS — N186 End stage renal disease: Secondary | ICD-10-CM | POA: Diagnosis not present

## 2015-08-25 ENCOUNTER — Other Ambulatory Visit: Payer: Self-pay | Admitting: Physician Assistant

## 2015-08-25 NOTE — Telephone Encounter (Signed)
Refill sent per [facility] refill protocol/SLS  

## 2015-08-26 DIAGNOSIS — Z89512 Acquired absence of left leg below knee: Secondary | ICD-10-CM | POA: Diagnosis not present

## 2015-08-26 DIAGNOSIS — I890 Lymphedema, not elsewhere classified: Secondary | ICD-10-CM | POA: Diagnosis not present

## 2015-08-26 DIAGNOSIS — Z992 Dependence on renal dialysis: Secondary | ICD-10-CM | POA: Diagnosis not present

## 2015-08-26 DIAGNOSIS — D631 Anemia in chronic kidney disease: Secondary | ICD-10-CM | POA: Diagnosis not present

## 2015-08-26 DIAGNOSIS — N186 End stage renal disease: Secondary | ICD-10-CM | POA: Diagnosis not present

## 2015-08-28 DIAGNOSIS — N186 End stage renal disease: Secondary | ICD-10-CM | POA: Diagnosis not present

## 2015-08-28 DIAGNOSIS — D631 Anemia in chronic kidney disease: Secondary | ICD-10-CM | POA: Diagnosis not present

## 2015-08-28 DIAGNOSIS — I10 Essential (primary) hypertension: Secondary | ICD-10-CM | POA: Diagnosis not present

## 2015-08-30 DIAGNOSIS — N186 End stage renal disease: Secondary | ICD-10-CM | POA: Diagnosis not present

## 2015-08-30 DIAGNOSIS — N2581 Secondary hyperparathyroidism of renal origin: Secondary | ICD-10-CM | POA: Diagnosis not present

## 2015-08-30 DIAGNOSIS — D631 Anemia in chronic kidney disease: Secondary | ICD-10-CM | POA: Diagnosis not present

## 2015-09-02 DIAGNOSIS — E039 Hypothyroidism, unspecified: Secondary | ICD-10-CM | POA: Diagnosis not present

## 2015-09-02 DIAGNOSIS — N186 End stage renal disease: Secondary | ICD-10-CM | POA: Diagnosis not present

## 2015-09-02 DIAGNOSIS — E1165 Type 2 diabetes mellitus with hyperglycemia: Secondary | ICD-10-CM | POA: Diagnosis not present

## 2015-09-02 DIAGNOSIS — N2581 Secondary hyperparathyroidism of renal origin: Secondary | ICD-10-CM | POA: Diagnosis not present

## 2015-09-02 DIAGNOSIS — D631 Anemia in chronic kidney disease: Secondary | ICD-10-CM | POA: Diagnosis not present

## 2015-09-02 DIAGNOSIS — K769 Liver disease, unspecified: Secondary | ICD-10-CM | POA: Diagnosis not present

## 2015-09-02 LAB — BASIC METABOLIC PANEL
GLUCOSE: 128 mg/dL
POTASSIUM: 4.7 mmol/L (ref 3.4–5.3)

## 2015-09-04 DIAGNOSIS — N2581 Secondary hyperparathyroidism of renal origin: Secondary | ICD-10-CM | POA: Diagnosis not present

## 2015-09-04 DIAGNOSIS — N186 End stage renal disease: Secondary | ICD-10-CM | POA: Diagnosis not present

## 2015-09-04 DIAGNOSIS — D631 Anemia in chronic kidney disease: Secondary | ICD-10-CM | POA: Diagnosis not present

## 2015-09-05 NOTE — Assessment & Plan Note (Signed)
Patient recently discharged from home PT. Concern that patient would deteriorate again if left at home. This was also concern from home health RN. Patient has refused care mutliple times. Reports of him sitting in stool for up to 12 hours while waitin on wife to come home as he refuses to wear prosthetic or to use the bedside commode. Patient states this is false that he only uses the bedpan sometimes. When questioned further about safety at home, it does seem that he is sitting most of the day, sometimes in stool if unable to get on the bed pan or get off of the bed pan without making a mess. States he refuses to consider ALF or SNF. I am highly concerned for patient safety and discussed with him that I cannot supervise the neglect of his care. He does agree to outpatient PT which I feel will be of more benefit than home PT. He is starting [facility] this month who will be able to give him more access to services and transportation to those services.

## 2015-09-05 NOTE — Assessment & Plan Note (Signed)
Last A1C at 5.3. Discussed again the need to wean down medication to prevent hypoglycemia giving such a low A1C. Will try to stop Mealtime insulin. Titration for Lantus discussed. Patient endorses understanding but there is a significant history of non-compliance. Patient to be starting [facility] this month who will take over further management.

## 2015-09-05 NOTE — Assessment & Plan Note (Signed)
BP well controlled. Managed by Nephrology. Will request recent labs.

## 2015-09-05 NOTE — Assessment & Plan Note (Signed)
Endorses taking medications as directed. Will have TSH drawn at dialysis as patient refuses lab draw here at the office

## 2015-09-07 DIAGNOSIS — N2581 Secondary hyperparathyroidism of renal origin: Secondary | ICD-10-CM | POA: Diagnosis not present

## 2015-09-07 DIAGNOSIS — N186 End stage renal disease: Secondary | ICD-10-CM | POA: Diagnosis not present

## 2015-09-07 DIAGNOSIS — D631 Anemia in chronic kidney disease: Secondary | ICD-10-CM | POA: Diagnosis not present

## 2015-09-08 ENCOUNTER — Other Ambulatory Visit: Payer: Self-pay | Admitting: Physician Assistant

## 2015-09-08 NOTE — Telephone Encounter (Signed)
Rx request to pharmacy/SLS  

## 2015-09-09 DIAGNOSIS — N2581 Secondary hyperparathyroidism of renal origin: Secondary | ICD-10-CM | POA: Diagnosis not present

## 2015-09-09 DIAGNOSIS — D631 Anemia in chronic kidney disease: Secondary | ICD-10-CM | POA: Diagnosis not present

## 2015-09-09 DIAGNOSIS — N186 End stage renal disease: Secondary | ICD-10-CM | POA: Diagnosis not present

## 2015-09-10 DIAGNOSIS — N2581 Secondary hyperparathyroidism of renal origin: Secondary | ICD-10-CM | POA: Diagnosis not present

## 2015-09-10 DIAGNOSIS — D631 Anemia in chronic kidney disease: Secondary | ICD-10-CM | POA: Diagnosis not present

## 2015-09-10 DIAGNOSIS — N186 End stage renal disease: Secondary | ICD-10-CM | POA: Diagnosis not present

## 2015-09-11 ENCOUNTER — Other Ambulatory Visit: Payer: Self-pay | Admitting: Physician Assistant

## 2015-09-13 DIAGNOSIS — N186 End stage renal disease: Secondary | ICD-10-CM | POA: Diagnosis not present

## 2015-09-13 DIAGNOSIS — D631 Anemia in chronic kidney disease: Secondary | ICD-10-CM | POA: Diagnosis not present

## 2015-09-13 DIAGNOSIS — N2581 Secondary hyperparathyroidism of renal origin: Secondary | ICD-10-CM | POA: Diagnosis not present

## 2015-09-13 NOTE — Telephone Encounter (Signed)
Refill sent per [facility] refill protocol/SLS 07/17

## 2015-09-14 ENCOUNTER — Telehealth: Payer: Self-pay | Admitting: Physician Assistant

## 2015-09-14 DIAGNOSIS — R2681 Unsteadiness on feet: Secondary | ICD-10-CM

## 2015-09-14 NOTE — Telephone Encounter (Signed)
Referral placed. Check with patient to see what locations may work best for him.

## 2015-09-14 NOTE — Telephone Encounter (Signed)
Caller name:Raman Adell Relationship to [phone] Can be reached: Pharmacy:  Reason for call:requesting referral for PT, would like outpatient

## 2015-09-14 NOTE — Telephone Encounter (Signed)
Pt says that he received his refill for his medication but he only received a 30 day supply. He says that PCP usually provides him with a 90 day supply. Pt says that it is less expensive for him to receive a 90 day instead.      Please advise pt further.

## 2015-09-16 DIAGNOSIS — D631 Anemia in chronic kidney disease: Secondary | ICD-10-CM | POA: Diagnosis not present

## 2015-09-16 DIAGNOSIS — N2581 Secondary hyperparathyroidism of renal origin: Secondary | ICD-10-CM | POA: Diagnosis not present

## 2015-09-16 DIAGNOSIS — N186 End stage renal disease: Secondary | ICD-10-CM | POA: Diagnosis not present

## 2015-09-16 NOTE — Telephone Encounter (Signed)
Which medication is Pt referring?

## 2015-09-18 DIAGNOSIS — D631 Anemia in chronic kidney disease: Secondary | ICD-10-CM | POA: Diagnosis not present

## 2015-09-18 DIAGNOSIS — N2581 Secondary hyperparathyroidism of renal origin: Secondary | ICD-10-CM | POA: Diagnosis not present

## 2015-09-18 DIAGNOSIS — N186 End stage renal disease: Secondary | ICD-10-CM | POA: Diagnosis not present

## 2015-09-20 NOTE — Telephone Encounter (Deleted)
Ca

## 2015-09-20 NOTE — Telephone Encounter (Signed)
Called pt to follow up and get clarity. No answer. Lvm for him to return call with detail.

## 2015-09-21 DIAGNOSIS — N2581 Secondary hyperparathyroidism of renal origin: Secondary | ICD-10-CM | POA: Diagnosis not present

## 2015-09-21 DIAGNOSIS — N186 End stage renal disease: Secondary | ICD-10-CM | POA: Diagnosis not present

## 2015-09-21 DIAGNOSIS — D631 Anemia in chronic kidney disease: Secondary | ICD-10-CM | POA: Diagnosis not present

## 2015-09-23 DIAGNOSIS — N2581 Secondary hyperparathyroidism of renal origin: Secondary | ICD-10-CM | POA: Diagnosis not present

## 2015-09-23 DIAGNOSIS — N186 End stage renal disease: Secondary | ICD-10-CM | POA: Diagnosis not present

## 2015-09-23 DIAGNOSIS — D631 Anemia in chronic kidney disease: Secondary | ICD-10-CM | POA: Diagnosis not present

## 2015-09-24 ENCOUNTER — Telehealth: Payer: Self-pay

## 2015-09-24 NOTE — Telephone Encounter (Signed)
[facility] note received via fax  Call Date: 09/23/15 Time:8:05 pm  Caller: Teion Cureton Return number: [id number]  Nurse: Malachi Carl   Chief Complaint:Lantus Insulin  Reason for call: Medication Question    Disposition:  Nurse called [facility] to change Lantus vial order to Basaglar Quick Pen for the pt who has 2 refills on his Lantus. Spoke with Pharmacist Joe who advised patient. His wife works at [employer] and will bring the refill home for the patient.    Called patient for follow up. Left message on answering machine for return call.

## 2015-09-25 DIAGNOSIS — N186 End stage renal disease: Secondary | ICD-10-CM | POA: Diagnosis not present

## 2015-09-25 DIAGNOSIS — D631 Anemia in chronic kidney disease: Secondary | ICD-10-CM | POA: Diagnosis not present

## 2015-09-25 DIAGNOSIS — N2581 Secondary hyperparathyroidism of renal origin: Secondary | ICD-10-CM | POA: Diagnosis not present

## 2015-09-27 DIAGNOSIS — N200 Calculus of kidney: Secondary | ICD-10-CM | POA: Diagnosis not present

## 2015-09-27 DIAGNOSIS — N186 End stage renal disease: Secondary | ICD-10-CM | POA: Diagnosis not present

## 2015-09-27 DIAGNOSIS — I890 Lymphedema, not elsewhere classified: Secondary | ICD-10-CM | POA: Diagnosis not present

## 2015-09-27 DIAGNOSIS — Z992 Dependence on renal dialysis: Secondary | ICD-10-CM | POA: Diagnosis not present

## 2015-09-27 DIAGNOSIS — N289 Disorder of kidney and ureter, unspecified: Secondary | ICD-10-CM | POA: Diagnosis not present

## 2015-09-27 DIAGNOSIS — Z436 Encounter for attention to other artificial openings of urinary tract: Secondary | ICD-10-CM | POA: Diagnosis not present

## 2015-09-27 DIAGNOSIS — R102 Pelvic and perineal pain: Secondary | ICD-10-CM | POA: Diagnosis not present

## 2015-09-27 DIAGNOSIS — Z906 Acquired absence of other parts of urinary tract: Secondary | ICD-10-CM | POA: Diagnosis not present

## 2015-09-27 DIAGNOSIS — Z86718 Personal history of other venous thrombosis and embolism: Secondary | ICD-10-CM | POA: Diagnosis not present

## 2015-09-28 DIAGNOSIS — N186 End stage renal disease: Secondary | ICD-10-CM | POA: Diagnosis not present

## 2015-09-28 DIAGNOSIS — I10 Essential (primary) hypertension: Secondary | ICD-10-CM | POA: Diagnosis not present

## 2015-09-28 DIAGNOSIS — D631 Anemia in chronic kidney disease: Secondary | ICD-10-CM | POA: Diagnosis not present

## 2015-09-30 DIAGNOSIS — K769 Liver disease, unspecified: Secondary | ICD-10-CM | POA: Diagnosis not present

## 2015-09-30 DIAGNOSIS — E1165 Type 2 diabetes mellitus with hyperglycemia: Secondary | ICD-10-CM | POA: Diagnosis not present

## 2015-09-30 DIAGNOSIS — N186 End stage renal disease: Secondary | ICD-10-CM | POA: Diagnosis not present

## 2015-09-30 DIAGNOSIS — D631 Anemia in chronic kidney disease: Secondary | ICD-10-CM | POA: Diagnosis not present

## 2015-10-02 DIAGNOSIS — N186 End stage renal disease: Secondary | ICD-10-CM | POA: Diagnosis not present

## 2015-10-02 DIAGNOSIS — D631 Anemia in chronic kidney disease: Secondary | ICD-10-CM | POA: Diagnosis not present

## 2015-10-05 DIAGNOSIS — N186 End stage renal disease: Secondary | ICD-10-CM | POA: Diagnosis not present

## 2015-10-05 DIAGNOSIS — D631 Anemia in chronic kidney disease: Secondary | ICD-10-CM | POA: Diagnosis not present

## 2015-10-07 ENCOUNTER — Other Ambulatory Visit: Payer: Self-pay | Admitting: Physician Assistant

## 2015-10-07 DIAGNOSIS — N186 End stage renal disease: Secondary | ICD-10-CM | POA: Diagnosis not present

## 2015-10-07 DIAGNOSIS — D631 Anemia in chronic kidney disease: Secondary | ICD-10-CM | POA: Diagnosis not present

## 2015-10-08 DIAGNOSIS — D631 Anemia in chronic kidney disease: Secondary | ICD-10-CM | POA: Diagnosis not present

## 2015-10-08 DIAGNOSIS — N186 End stage renal disease: Secondary | ICD-10-CM | POA: Diagnosis not present

## 2015-10-11 DIAGNOSIS — N186 End stage renal disease: Secondary | ICD-10-CM | POA: Diagnosis not present

## 2015-10-11 DIAGNOSIS — D631 Anemia in chronic kidney disease: Secondary | ICD-10-CM | POA: Diagnosis not present

## 2015-10-11 NOTE — Telephone Encounter (Signed)
Rx request to pharmacy/SLS  

## 2015-10-14 DIAGNOSIS — D631 Anemia in chronic kidney disease: Secondary | ICD-10-CM | POA: Diagnosis not present

## 2015-10-14 DIAGNOSIS — N186 End stage renal disease: Secondary | ICD-10-CM | POA: Diagnosis not present

## 2015-10-16 DIAGNOSIS — D631 Anemia in chronic kidney disease: Secondary | ICD-10-CM | POA: Diagnosis not present

## 2015-10-16 DIAGNOSIS — N186 End stage renal disease: Secondary | ICD-10-CM | POA: Diagnosis not present

## 2015-10-19 DIAGNOSIS — N186 End stage renal disease: Secondary | ICD-10-CM | POA: Diagnosis not present

## 2015-10-19 DIAGNOSIS — D631 Anemia in chronic kidney disease: Secondary | ICD-10-CM | POA: Diagnosis not present

## 2015-10-21 DIAGNOSIS — N186 End stage renal disease: Secondary | ICD-10-CM | POA: Diagnosis not present

## 2015-10-21 DIAGNOSIS — D631 Anemia in chronic kidney disease: Secondary | ICD-10-CM | POA: Diagnosis not present

## 2015-10-22 DIAGNOSIS — D631 Anemia in chronic kidney disease: Secondary | ICD-10-CM | POA: Diagnosis not present

## 2015-10-22 DIAGNOSIS — N186 End stage renal disease: Secondary | ICD-10-CM | POA: Diagnosis not present

## 2015-10-25 DIAGNOSIS — N186 End stage renal disease: Secondary | ICD-10-CM | POA: Diagnosis not present

## 2015-10-25 DIAGNOSIS — D631 Anemia in chronic kidney disease: Secondary | ICD-10-CM | POA: Diagnosis not present

## 2015-10-28 DIAGNOSIS — D631 Anemia in chronic kidney disease: Secondary | ICD-10-CM | POA: Diagnosis not present

## 2015-10-28 DIAGNOSIS — N186 End stage renal disease: Secondary | ICD-10-CM | POA: Diagnosis not present

## 2015-10-29 DIAGNOSIS — I10 Essential (primary) hypertension: Secondary | ICD-10-CM | POA: Diagnosis not present

## 2015-10-29 DIAGNOSIS — D631 Anemia in chronic kidney disease: Secondary | ICD-10-CM | POA: Diagnosis not present

## 2015-10-29 DIAGNOSIS — N186 End stage renal disease: Secondary | ICD-10-CM | POA: Diagnosis not present

## 2015-10-30 DIAGNOSIS — N186 End stage renal disease: Secondary | ICD-10-CM | POA: Diagnosis not present

## 2015-10-30 DIAGNOSIS — Z23 Encounter for immunization: Secondary | ICD-10-CM | POA: Diagnosis not present

## 2015-10-30 DIAGNOSIS — D631 Anemia in chronic kidney disease: Secondary | ICD-10-CM | POA: Diagnosis not present

## 2015-11-02 DIAGNOSIS — Z23 Encounter for immunization: Secondary | ICD-10-CM | POA: Diagnosis not present

## 2015-11-02 DIAGNOSIS — D631 Anemia in chronic kidney disease: Secondary | ICD-10-CM | POA: Diagnosis not present

## 2015-11-02 DIAGNOSIS — N186 End stage renal disease: Secondary | ICD-10-CM | POA: Diagnosis not present

## 2015-11-04 DIAGNOSIS — D631 Anemia in chronic kidney disease: Secondary | ICD-10-CM | POA: Diagnosis not present

## 2015-11-04 DIAGNOSIS — K769 Liver disease, unspecified: Secondary | ICD-10-CM | POA: Diagnosis not present

## 2015-11-04 DIAGNOSIS — E1165 Type 2 diabetes mellitus with hyperglycemia: Secondary | ICD-10-CM | POA: Diagnosis not present

## 2015-11-04 DIAGNOSIS — N186 End stage renal disease: Secondary | ICD-10-CM | POA: Diagnosis not present

## 2015-11-04 DIAGNOSIS — Z23 Encounter for immunization: Secondary | ICD-10-CM | POA: Diagnosis not present

## 2015-11-06 DIAGNOSIS — Z23 Encounter for immunization: Secondary | ICD-10-CM | POA: Diagnosis not present

## 2015-11-06 DIAGNOSIS — D631 Anemia in chronic kidney disease: Secondary | ICD-10-CM | POA: Diagnosis not present

## 2015-11-06 DIAGNOSIS — N186 End stage renal disease: Secondary | ICD-10-CM | POA: Diagnosis not present

## 2015-11-09 DIAGNOSIS — N186 End stage renal disease: Secondary | ICD-10-CM | POA: Diagnosis not present

## 2015-11-09 DIAGNOSIS — D631 Anemia in chronic kidney disease: Secondary | ICD-10-CM | POA: Diagnosis not present

## 2015-11-09 DIAGNOSIS — Z23 Encounter for immunization: Secondary | ICD-10-CM | POA: Diagnosis not present

## 2015-11-11 DIAGNOSIS — N186 End stage renal disease: Secondary | ICD-10-CM | POA: Diagnosis not present

## 2015-11-11 DIAGNOSIS — D631 Anemia in chronic kidney disease: Secondary | ICD-10-CM | POA: Diagnosis not present

## 2015-11-11 DIAGNOSIS — Z23 Encounter for immunization: Secondary | ICD-10-CM | POA: Diagnosis not present

## 2015-11-13 DIAGNOSIS — Z23 Encounter for immunization: Secondary | ICD-10-CM | POA: Diagnosis not present

## 2015-11-13 DIAGNOSIS — N186 End stage renal disease: Secondary | ICD-10-CM | POA: Diagnosis not present

## 2015-11-13 DIAGNOSIS — D631 Anemia in chronic kidney disease: Secondary | ICD-10-CM | POA: Diagnosis not present

## 2015-11-16 DIAGNOSIS — N186 End stage renal disease: Secondary | ICD-10-CM | POA: Diagnosis not present

## 2015-11-16 DIAGNOSIS — D631 Anemia in chronic kidney disease: Secondary | ICD-10-CM | POA: Diagnosis not present

## 2015-11-16 DIAGNOSIS — Z23 Encounter for immunization: Secondary | ICD-10-CM | POA: Diagnosis not present

## 2015-11-18 DIAGNOSIS — N186 End stage renal disease: Secondary | ICD-10-CM | POA: Diagnosis not present

## 2015-11-18 DIAGNOSIS — D631 Anemia in chronic kidney disease: Secondary | ICD-10-CM | POA: Diagnosis not present

## 2015-11-18 DIAGNOSIS — Z114 Encounter for screening for human immunodeficiency virus [HIV]: Secondary | ICD-10-CM | POA: Diagnosis not present

## 2015-11-18 DIAGNOSIS — Z23 Encounter for immunization: Secondary | ICD-10-CM | POA: Diagnosis not present

## 2015-11-20 DIAGNOSIS — N186 End stage renal disease: Secondary | ICD-10-CM | POA: Diagnosis not present

## 2015-11-20 DIAGNOSIS — D631 Anemia in chronic kidney disease: Secondary | ICD-10-CM | POA: Diagnosis not present

## 2015-11-20 DIAGNOSIS — Z23 Encounter for immunization: Secondary | ICD-10-CM | POA: Diagnosis not present

## 2015-11-23 ENCOUNTER — Inpatient Hospital Stay (HOSPITAL_COMMUNITY)
Admission: EM | Admit: 2015-11-23 | Discharge: 2015-12-01 | DRG: 312 | Disposition: A | Discharge status: Skilled Nursing Facility | Payer: Medicare Other | Attending: Internal Medicine | Admitting: Internal Medicine

## 2015-11-23 ENCOUNTER — Encounter (HOSPITAL_COMMUNITY): Payer: Self-pay | Admitting: Emergency Medicine

## 2015-11-23 DIAGNOSIS — I1311 Hypertensive heart and chronic kidney disease without heart failure, with stage 5 chronic kidney disease, or end stage renal disease: Secondary | ICD-10-CM | POA: Diagnosis present

## 2015-11-23 DIAGNOSIS — N189 Chronic kidney disease, unspecified: Secondary | ICD-10-CM

## 2015-11-23 DIAGNOSIS — Z936 Other artificial openings of urinary tract status: Secondary | ICD-10-CM

## 2015-11-23 DIAGNOSIS — E039 Hypothyroidism, unspecified: Secondary | ICD-10-CM | POA: Diagnosis present

## 2015-11-23 DIAGNOSIS — T148 Other injury of unspecified body region: Secondary | ICD-10-CM | POA: Diagnosis not present

## 2015-11-23 DIAGNOSIS — I959 Hypotension, unspecified: Secondary | ICD-10-CM

## 2015-11-23 DIAGNOSIS — Z993 Dependence on wheelchair: Secondary | ICD-10-CM

## 2015-11-23 DIAGNOSIS — N186 End stage renal disease: Secondary | ICD-10-CM | POA: Diagnosis not present

## 2015-11-23 DIAGNOSIS — Z992 Dependence on renal dialysis: Secondary | ICD-10-CM

## 2015-11-23 DIAGNOSIS — M25562 Pain in left knee: Secondary | ICD-10-CM | POA: Diagnosis not present

## 2015-11-23 DIAGNOSIS — M25571 Pain in right ankle and joints of right foot: Secondary | ICD-10-CM | POA: Diagnosis not present

## 2015-11-23 DIAGNOSIS — Z87442 Personal history of urinary calculi: Secondary | ICD-10-CM

## 2015-11-23 DIAGNOSIS — Z89512 Acquired absence of left leg below knee: Secondary | ICD-10-CM

## 2015-11-23 DIAGNOSIS — I952 Hypotension due to drugs: Secondary | ICD-10-CM | POA: Diagnosis not present

## 2015-11-23 DIAGNOSIS — Z794 Long term (current) use of insulin: Secondary | ICD-10-CM

## 2015-11-23 DIAGNOSIS — T404X5A Adverse effect of other synthetic narcotics, initial encounter: Secondary | ICD-10-CM | POA: Diagnosis present

## 2015-11-23 DIAGNOSIS — S8992XA Unspecified injury of left lower leg, initial encounter: Secondary | ICD-10-CM | POA: Diagnosis not present

## 2015-11-23 DIAGNOSIS — E1151 Type 2 diabetes mellitus with diabetic peripheral angiopathy without gangrene: Secondary | ICD-10-CM | POA: Diagnosis present

## 2015-11-23 DIAGNOSIS — Z89519 Acquired absence of unspecified leg below knee: Secondary | ICD-10-CM

## 2015-11-23 DIAGNOSIS — E1122 Type 2 diabetes mellitus with diabetic chronic kidney disease: Secondary | ICD-10-CM | POA: Diagnosis present

## 2015-11-23 DIAGNOSIS — Z6838 Body mass index (BMI) 38.0-38.9, adult: Secondary | ICD-10-CM

## 2015-11-23 DIAGNOSIS — Z833 Family history of diabetes mellitus: Secondary | ICD-10-CM

## 2015-11-23 DIAGNOSIS — Z8249 Family history of ischemic heart disease and other diseases of the circulatory system: Secondary | ICD-10-CM

## 2015-11-23 DIAGNOSIS — D631 Anemia in chronic kidney disease: Secondary | ICD-10-CM | POA: Diagnosis present

## 2015-11-23 DIAGNOSIS — E1121 Type 2 diabetes mellitus with diabetic nephropathy: Secondary | ICD-10-CM | POA: Diagnosis present

## 2015-11-23 DIAGNOSIS — I953 Hypotension of hemodialysis: Secondary | ICD-10-CM | POA: Diagnosis present

## 2015-11-23 DIAGNOSIS — I129 Hypertensive chronic kidney disease with stage 1 through stage 4 chronic kidney disease, or unspecified chronic kidney disease: Secondary | ICD-10-CM | POA: Diagnosis not present

## 2015-11-23 DIAGNOSIS — S99911A Unspecified injury of right ankle, initial encounter: Secondary | ICD-10-CM | POA: Diagnosis not present

## 2015-11-23 DIAGNOSIS — M25552 Pain in left hip: Secondary | ICD-10-CM | POA: Diagnosis not present

## 2015-11-23 DIAGNOSIS — E785 Hyperlipidemia, unspecified: Secondary | ICD-10-CM | POA: Diagnosis present

## 2015-11-23 DIAGNOSIS — M21961 Unspecified acquired deformity of right lower leg: Secondary | ICD-10-CM | POA: Diagnosis present

## 2015-11-23 DIAGNOSIS — S73102A Unspecified sprain of left hip, initial encounter: Secondary | ICD-10-CM | POA: Diagnosis not present

## 2015-11-23 DIAGNOSIS — M79605 Pain in left leg: Secondary | ICD-10-CM | POA: Diagnosis not present

## 2015-11-23 DIAGNOSIS — Z800 Family history of malignant neoplasm of digestive organs: Secondary | ICD-10-CM

## 2015-11-23 DIAGNOSIS — G8929 Other chronic pain: Secondary | ICD-10-CM | POA: Diagnosis present

## 2015-11-23 DIAGNOSIS — Y92538 Other ambulatory health services establishments as the place of occurrence of the external cause: Secondary | ICD-10-CM | POA: Diagnosis present

## 2015-11-23 DIAGNOSIS — W050XXA Fall from non-moving wheelchair, initial encounter: Secondary | ICD-10-CM | POA: Diagnosis present

## 2015-11-23 DIAGNOSIS — Z86718 Personal history of other venous thrombosis and embolism: Secondary | ICD-10-CM

## 2015-11-23 DIAGNOSIS — Z96642 Presence of left artificial hip joint: Secondary | ICD-10-CM | POA: Diagnosis present

## 2015-11-23 DIAGNOSIS — R0602 Shortness of breath: Secondary | ICD-10-CM | POA: Diagnosis not present

## 2015-11-23 DIAGNOSIS — E663 Overweight: Secondary | ICD-10-CM | POA: Diagnosis present

## 2015-11-23 NOTE — ED Triage Notes (Signed)
Patient arrived to ED via [facility]. EMS reports: patient from home. Reports he fell Saturday while at dialysis onto his L side. C/o L hip/thigh pain. 9-10. Patient wearing Fentanyl patch applied by spouse approx 2100 tonight.  Patient has dialysis Tu, Th & Sat. Did not go to dialysis today.  Patient has Left BKA. Also, has had L hip replacement as history.  Diabetic. Has not taken DM medications x 2 weeks.  VSS. BP 110/90, Pulse 92, Resp 16, 97% on 2 LPM. (EMS reports pulse ox 89-90% on room air & applied oxygen). CBG 204.

## 2015-11-23 NOTE — ED Provider Notes (Signed)
[facility] DEPT Provider Note   CSN: [id number] Arrival date & time: 11/23/15  2252  By signing my name below, I, [name], attest that this documentation has been prepared under the direction and in the presence of physician practitioner, Ripley Fraise, MD. Electronically Signed: [name], Scribe. 11/23/2015. 11:44 PM.  History   Chief Complaint Chief Complaint  Patient presents with  . Fall    The history is provided by the patient. No language interpreter was used.  Fall  This is a new problem. The current episode started more than 2 days ago. The problem occurs rarely. The problem has not changed since onset.Pertinent negatives include no chest pain, no abdominal pain, no headaches and no shortness of breath. Nothing aggravates the symptoms. Nothing relieves the symptoms. He has tried nothing for the symptoms.     HPI Comments: [name] is a 60 y.o. male brought in by EMS who presents to the Emergency Department complaining of sudden onset, constant, worsening, left hip pain s/p falling three days ago. Pt reports he was transporting into his wheelchair in dialysis and fell on his left hip. Pt notes he woke up the following day and his left hip pain had become severe. He notes he has been laying in bed due to his left hip pain since the fall. Pt also reports some pain in his left knee since the fall. He reports a chronic deformity of his right ankle and notes some pain and tingling in his right ankle since the fall. He notes a h/o left knee and left hip replacement. He states his last session of dialysis was three days ago; he states he was supposed to have treatment today but could not go due to left hip pain. Pt notes he has not been SOB since missing dialysis today.  Pt denies headache, head injury, neck pain, back pain, chest pain, abdominal pain, right hip pain, or any other associated symptoms. Pt has left BKA.  Past Medical History:  Diagnosis Date  . Acute  respiratory failure ([place])   . Anemia   . Anxiety   . AR (allergic rhinitis)   . Arthritis    osteoarthritis  . Blood transfusion   . BPH (benign prostatic hypertrophy)   . Cellulitis and abscess of leg   . Cholelithiasis   . Chronic kidney disease    hx of kidney stones  . Chronic kidney disease, stage III (moderate)   . Chronic pain   . Diabetes mellitus    insulin dependent  . Displacement of lumbar intervertebral disc without myelopathy    L4-L5  . Diverticulosis of colon   . DVT (deep venous thrombosis) (HCC)    Lower Extremity  . Embolism and thrombosis of unspecified artery ([place])   . Gallstone   . History of nephrolithiasis    Bilateral  . Hyperlipidemia   . Hypertension   . Hypertrophy of prostate   . Hypothyroidism   . Impotence of organic origin   . Morbid obesity ([place])   . Nephrolithiasis   . Other lymphedema   . Pancreatitis, acute   . Peripheral vascular disease ([place])   . Personal history of arthritis    Osteoarthritis  . Pneumonia   . Sleep apnea    uses cpap  . Unspecified septicemia     Patient Active Problem List   Diagnosis Date Noted  . Decubitus ulcer of buttock 01/22/2015  . S/P BKA (below knee amputation) unilateral ([place]) 01/22/2015  . Chronic venous insufficiency  09/08/2014  . Hyperlipidemia 12/23/2013  . Diabetic foot ([place]) 11/20/2013  . Chronic pain 05/21/2013  . Amputated below knee ([place]) 05/17/2013  . Chronic interstitial cystitis 02/06/2013  . UI (urinary incontinence) 10/29/2012  . CKD (chronic kidney disease) stage 3, GFR 30-59 ml/min 08/18/2012  . Hypothyroidism 08/04/2012  . OBESITY 09/21/2009  . Essential hypertension 09/21/2009  . DIVERTICULOSIS OF COLON 09/21/2009  . Diabetes mellitus type II, controlled ([place]) 10/01/2007  . DVT 10/01/2007    Past Surgical History:  Procedure Laterality Date  . AMPUTATION  02/07/2011   Procedure: AMPUTATION BELOW KNEE;  Surgeon: Newt Minion, MD;  Location: [facility];  Service:  Orthopedics;  Laterality: Left;  Left Below Knee Amputation  . bilteral hip     replacement & revison  's   . CYSTOSCOPY W/ RETROGRADES Bilateral 10/24/2012   Procedure: CYSTOSCOPY WITH RETROGRADE PYELOGRAM Procedure: Cystoscopy, Bilateral Retrogarde Pyelograms, Bladder Biopsy, Hydrodistension;  Surgeon: Franchot Gallo, MD;  Location: [facility];  Service: Urology;  Laterality: Bilateral;  . KIDNEY SURGERY    . KNEE SURGERY     left knee    . LEG AMPUTATION BELOW KNEE    . REPLACEMENT TOTAL KNEE    . TONSILLECTOMY         Home Medications    Prior to Admission medications   Medication Sig Start Date End Date Taking? Authorizing Provider  atorvastatin (LIPITOR) 40 MG tablet TAKE 1 TABLET (40 MG TOTAL) BY MOUTH DAILY. 09/13/15   Brunetta Jeans, PA-C  BAYER CONTOUR TEST test strip USE AS DIRECTED TO CHECK BLOOD SUGARS TWICE DAILY DX: E11.9 10/11/15   Brunetta Jeans, PA-C  fentaNYL (DURAGESIC - DOSED MCG/HR) 50 MCG/HR Place 1 patch (50 mcg total) onto the skin every 3 (three) days. 05/19/15   Brunetta Jeans, PA-C  ferrous sulfate 325 (65 FE) MG tablet Take 1 tablet (325 mg total) by mouth 2 (two) times daily. 08/10/14   Brunetta Jeans, PA-C  gabapentin (NEURONTIN) 100 MG capsule TAKE 1 CAPSULE (100 MG TOTAL) BY MOUTH 3 (THREE) TIMES DAILY. 08/25/15   Brunetta Jeans, PA-C  insulin aspart (NOVOLOG) 100 UNIT/ML injection INJECT 3 UNITS THREE TIMES DAILY WITH MEALS. AND AT NIGHTIME IF BLOOD SUGARS GREATER THAN 150 DX: E11.29 08/10/14   Brunetta Jeans, PA-C  Insulin Syringe-Needle U-100 (B-D INS SYRINGE 0.5CC/31GX5/16) 31G X 5/16" 0.5 ML MISC USE 4 TIMES A DAY FOR DIABETES BLOOD GLUCOSE TESTING DX: E11.29 08/10/14   Brunetta Jeans, PA-C  LANTUS 100 UNIT/ML injection INJECT 0.4 MLS (40 UNITS TOTAL) INTO THE SKIN AT BEDTIME. 07/05/15   Brunetta Jeans, PA-C  levothyroxine (SYNTHROID, LEVOTHROID) 200 MCG tablet TAKE 1 TABLET BY MOUTH EVERY MORNING 09/08/15   Brunetta Jeans, PA-C  levothyroxine  (SYNTHROID, LEVOTHROID) 25 MCG tablet TAKE 1 TABLET (25 MCG TOTAL) BY MOUTH DAILY BEFORE BREAKFAST. 09/13/15   Brunetta Jeans, PA-C  Multiple Vitamins-Minerals (MULTIVITAMINS THER. W/MINERALS) TABS Take 1 tablet by mouth daily.     Historical Provider, MD  sevelamer carbonate (RENVELA) 800 MG tablet Take 800 mg by mouth 3 (three) times daily with meals.    Historical Provider, MD    Family History Family History  Problem Relation Age of Onset  . Diabetes Mother   . Heart attack Mother   . Stroke Mother   . Colon cancer Father   . Dementia Father   . Heart attack Father   . Arthritis Paternal Grandmother   . Hypertension Other   .  Hypothyroidism Other     Social History Social History  Substance Use Topics  . Smoking status: Never Smoker  . Smokeless tobacco: Never Used  . Alcohol use No     Allergies   Review of patient's allergies indicates no known allergies.   Review of Systems Review of Systems  Respiratory: Negative for shortness of breath.   Cardiovascular: Negative for chest pain.  Gastrointestinal: Negative for abdominal pain.  Musculoskeletal: Positive for arthralgias (left knee, right ankle) and myalgias (left hip). Negative for back pain and neck pain.  Skin: Negative for wound.  Neurological: Positive for numbness (right ankle). Negative for headaches.  All other systems reviewed and are negative.   Physical Exam Updated Vital Signs BP (!) 90/54 (BP Location: Left Arm)   Pulse 92   Temp 98.3 F (36.8 C) (Oral)   Resp 20   SpO2 97%   Physical Exam  CONSTITUTIONAL: older than stated age.  Chronically ill appearing HEAD: Normocephalic/atraumatic EYES: EOMI ENMT: Mucous membranes moist NECK: supple no meningeal signs SPINE/BACK:entire spine nontender CV: S1/S2 noted, no murmurs/rubs/gallops noted LUNGS: Lungs are clear to auscultation bilaterally, no apparent distress ABDOMEN: soft, nontender, no rebound or guarding, bowel sounds noted throughout  abdomen. Obese with urostomy in place. NEURO: Pt is awake/alert/appropriate, moves all extremitiesx4. EXTREMITIES: pulses normal/equal, full ROM. Left BKA noted with tenderness to palpation of left hip and left knee. No left femur tenderness to palpation.  Tenderness to right ankle with chronic deformity noted. Patient with overlying chronic skin changes to extremities. No difficulty with ROM of right hip. Dialysis access to right UE with thrill noted SKIN: warm, color normal PSYCH: no abnormalities of mood noted, alert and oriented to situation  ED Treatments / Results  Labs (all labs ordered are listed, but only abnormal results are displayed) Labs Reviewed  BASIC METABOLIC PANEL - Abnormal; Notable for the following:       Result Value   Potassium 5.5 (*)    CO2 19 (*)    Glucose, Bld 171 (*)    BUN 83 (*)    Creatinine, Ser 9.00 (*)    Calcium 8.1 (*)    GFR calc non Af Amer 6 (*)    GFR calc Af Amer 7 (*)    All other components within normal limits  CBC WITH DIFFERENTIAL/PLATELET - Abnormal; Notable for the following:    RBC 3.06 (*)    Hemoglobin 9.6 (*)    HCT 30.9 (*)    MCV 101.0 (*)    RDW 16.7 (*)    Platelets 132 (*)    Lymphs Abs 0.5 (*)    All other components within normal limits  URINE CULTURE  URINALYSIS, ROUTINE W REFLEX MICROSCOPIC (NOT AT [facility])  I-STAT CG4 LACTIC ACID, ED    EKG  EKG Interpretation  Date/Time:  Wednesday November 24 2015 01:09:45 EDT Ventricular Rate:  89 PR Interval:    QRS Duration: 89 QT Interval:  384 QTC Calculation: 468 R Axis:   29 Text Interpretation:  Sinus rhythm Atrial premature complexes Prolonged PR interval Low voltage, extremity and precordial leads Confirmed by Christy Gentles  MD, Kebrina Friend ([id number]) on 11/24/2015 1:51:26 AM       Radiology Dg Ankle Complete Right  Result Date: 11/24/2015 CLINICAL DATA:  Status post fall, with right ankle pain. Initial encounter. EXAM: RIGHT ANKLE - COMPLETE 3+ VIEW COMPARISON:  Right  tibia/fibula radiographs performed 11/26/2008 FINDINGS: There is no evidence of acute fracture or dislocation. There is chronic flattening  of the ankle mortise and subtalar joint, possibly reflecting remote calcaneal injury or Charcot joint. Diffuse vascular calcifications are seen. Mild soft tissue swelling is noted at the heel. IMPRESSION: 1. No evidence of acute fracture or dislocation. Chronic flattening of the ankle mortise and subtalar joint, possibly reflecting remote calcaneal injury or Charcot joint. 2. Diffuse vascular calcifications seen. Electronically Signed   By: Garald Balding M.D.   On: 11/24/2015 01:23   Dg Knee Complete 4 Views Left  Result Date: 11/24/2015 CLINICAL DATA:  Status post fall yesterday, with left knee pain. Initial encounter. EXAM: LEFT KNEE - COMPLETE 4+ VIEW COMPARISON:  CT of the abdomen and pelvis performed 04/13/2015 FINDINGS: There is no evidence of acute fracture or dislocation. A total knee arthroplasty with chronic components is noted, without evidence of loosening. The patient is status post below-the-knee amputation. The stump is grossly unremarkable, though not well assessed on radiograph. Scattered vascular calcifications are noted. IMPRESSION: 1. No evidence of acute fracture or dislocation. Total knee arthroplasty is grossly unremarkable in appearance, without evidence of loosening. 2. Scattered vascular calcifications seen. Electronically Signed   By: Garald Balding M.D.   On: 11/24/2015 01:21   Dg Hip Unilat W Or Wo Pelvis 2-3 Views Left  Result Date: 11/24/2015 CLINICAL DATA:  Status post fall yesterday, with left hip pain. Initial encounter. EXAM: DG HIP (WITH OR WITHOUT PELVIS) 2-3V LEFT COMPARISON:  CT of the abdomen and pelvis from 04/13/2015 FINDINGS: There is no evidence of fracture or dislocation. 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 The right hip arthroplasty is grossly unremarkable  in appearance. Surrounding lucency is thought to reflect overlying soft tissues. The sacroiliac joints are grossly unremarkable. Mild degenerative change is noted at the lower lumbar spine. The visualized bowel gas pattern is grossly unremarkable in appearance. Scattered vascular calcifications are seen. IMPRESSION: 1. No evidence of fracture or dislocation. 2. 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 3. Scattered vascular calcifications seen. Electronically Signed   By: Garald Balding M.D.   On: 11/24/2015 01:19    Procedures Procedures (including critical care time)  DIAGNOSTIC STUDIES: Oxygen Saturation is 97% on RA, normal by my interpretation.    COORDINATION OF CARE: 11:53 PM Discussed treatment plan with pt at bedside and pt agreed to plan.  Medications Ordered in ED Medications  sodium chloride 0.9 % bolus 500 mL (not administered)     Initial Impression / Assessment and Plan / ED Course  I have reviewed the triage vital signs and the nursing notes.  Pertinent labs & imaging results that were available during my care of the patient were reviewed by me and considered in my medical decision making (see chart for details).  Clinical Course    Imaging negative Pt still reports pain and has a hard time with movement due to pain He is still mildly hypotensive Labs and further imaging ordered Pt did miss today's dialysis session 4:14 AM Pt admits to recent diarrhea Also - recently re-started fentanyl patch due to recent leg pain I suspect this may be contributing to his hypotension as well as due to diarrhea No signs of sepsis However he continues to feel weak No signs of acute traumatic injury to left LE Due to weakness/hypotension and need for dialysis, will admit 4:27 AM D/w dr danford for admission He requests I ordered 532ml bolus for patient   I personally performed the services described  in this  documentation, which was scribed in my presence. The recorded information has been reviewed and is accurate.      Final Clinical Impressions(s) / ED Diagnoses   Final diagnoses:  Hypotension, unspecified hypotension type  Chronic renal failure, unspecified stage  Sprain of left hip, initial encounter    New Prescriptions New Prescriptions   No medications on file     Ripley Fraise, MD 11/24/15 [phone]

## 2015-11-23 NOTE — ED Notes (Signed)
Notified Dr. Lita Mains that patient's BP low - 82 systolic. Fentanyl patch removed.

## 2015-11-23 NOTE — ED Notes (Addendum)
BP 89/52. Removed Fentanyl patch.

## 2015-11-24 ENCOUNTER — Emergency Department (HOSPITAL_COMMUNITY): Payer: Medicare Other

## 2015-11-24 ENCOUNTER — Encounter (HOSPITAL_COMMUNITY): Payer: Self-pay | Admitting: Family Medicine

## 2015-11-24 DIAGNOSIS — R0602 Shortness of breath: Secondary | ICD-10-CM | POA: Diagnosis not present

## 2015-11-24 DIAGNOSIS — I959 Hypotension, unspecified: Secondary | ICD-10-CM | POA: Diagnosis not present

## 2015-11-24 DIAGNOSIS — G8929 Other chronic pain: Secondary | ICD-10-CM | POA: Diagnosis not present

## 2015-11-24 DIAGNOSIS — E039 Hypothyroidism, unspecified: Secondary | ICD-10-CM | POA: Diagnosis not present

## 2015-11-24 DIAGNOSIS — S99911A Unspecified injury of right ankle, initial encounter: Secondary | ICD-10-CM | POA: Diagnosis not present

## 2015-11-24 DIAGNOSIS — M25552 Pain in left hip: Secondary | ICD-10-CM | POA: Diagnosis not present

## 2015-11-24 DIAGNOSIS — M25571 Pain in right ankle and joints of right foot: Secondary | ICD-10-CM | POA: Diagnosis not present

## 2015-11-24 DIAGNOSIS — S8992XA Unspecified injury of left lower leg, initial encounter: Secondary | ICD-10-CM | POA: Diagnosis not present

## 2015-11-24 DIAGNOSIS — E1121 Type 2 diabetes mellitus with diabetic nephropathy: Secondary | ICD-10-CM

## 2015-11-24 DIAGNOSIS — I953 Hypotension of hemodialysis: Secondary | ICD-10-CM | POA: Diagnosis present

## 2015-11-24 DIAGNOSIS — N186 End stage renal disease: Secondary | ICD-10-CM | POA: Diagnosis not present

## 2015-11-24 DIAGNOSIS — Z89512 Acquired absence of left leg below knee: Secondary | ICD-10-CM

## 2015-11-24 DIAGNOSIS — M25562 Pain in left knee: Secondary | ICD-10-CM | POA: Diagnosis present

## 2015-11-24 DIAGNOSIS — Z794 Long term (current) use of insulin: Secondary | ICD-10-CM

## 2015-11-24 LAB — CBC WITH DIFFERENTIAL/PLATELET
BASOS PCT: 0 %
Basophils Absolute: 0 10*3/uL (ref 0.0–0.1)
Eosinophils Absolute: 0.1 10*3/uL (ref 0.0–0.7)
Eosinophils Relative: 2 %
HEMATOCRIT: 30.9 % — AB (ref 39.0–52.0)
Hemoglobin: 9.6 g/dL — ABNORMAL LOW (ref 13.0–17.0)
LYMPHS PCT: 12 %
Lymphs Abs: 0.5 10*3/uL — ABNORMAL LOW (ref 0.7–4.0)
MCH: 31.4 pg (ref 26.0–34.0)
MCHC: 31.1 g/dL (ref 30.0–36.0)
MCV: 101 fL — AB (ref 78.0–100.0)
MONO ABS: 0.6 10*3/uL (ref 0.1–1.0)
MONOS PCT: 13 %
NEUTROS ABS: 3.1 10*3/uL (ref 1.7–7.7)
Neutrophils Relative %: 73 %
Platelets: 132 10*3/uL — ABNORMAL LOW (ref 150–400)
RBC: 3.06 MIL/uL — ABNORMAL LOW (ref 4.22–5.81)
RDW: 16.7 % — AB (ref 11.5–15.5)
WBC: 4.3 10*3/uL (ref 4.0–10.5)

## 2015-11-24 LAB — BASIC METABOLIC PANEL
ANION GAP: 15 (ref 5–15)
BUN: 83 mg/dL — ABNORMAL HIGH (ref 6–20)
CALCIUM: 8.1 mg/dL — AB (ref 8.9–10.3)
CO2: 19 mmol/L — AB (ref 22–32)
Chloride: 102 mmol/L (ref 101–111)
Creatinine, Ser: 9 mg/dL — ABNORMAL HIGH (ref 0.61–1.24)
GFR calc Af Amer: 7 mL/min — ABNORMAL LOW (ref >60)
GFR calc non Af Amer: 6 mL/min — ABNORMAL LOW (ref >60)
GLUCOSE: 171 mg/dL — AB (ref 65–99)
Potassium: 5.5 mmol/L — ABNORMAL HIGH (ref 3.5–5.1)
Sodium: 136 mmol/L (ref 135–145)

## 2015-11-24 LAB — COMPREHENSIVE METABOLIC PANEL
ALBUMIN: 2.5 g/dL — AB (ref 3.5–5.0)
ALK PHOS: 79 U/L (ref 38–126)
ALT: 8 U/L — ABNORMAL LOW (ref 17–63)
ANION GAP: 24 — AB (ref 5–15)
AST: 10 U/L — AB (ref 15–41)
BILIRUBIN TOTAL: 1.4 mg/dL — AB (ref 0.3–1.2)
BUN: 89 mg/dL — AB (ref 6–20)
CALCIUM: 8 mg/dL — AB (ref 8.9–10.3)
CO2: 18 mmol/L — ABNORMAL LOW (ref 22–32)
Chloride: 96 mmol/L — ABNORMAL LOW (ref 101–111)
Creatinine, Ser: 9.37 mg/dL — ABNORMAL HIGH (ref 0.61–1.24)
GFR calc Af Amer: 6 mL/min — ABNORMAL LOW (ref >60)
GFR, EST NON AFRICAN AMERICAN: 5 mL/min — AB (ref >60)
GLUCOSE: 136 mg/dL — AB (ref 65–99)
Potassium: 5.6 mmol/L — ABNORMAL HIGH (ref 3.5–5.1)
Sodium: 138 mmol/L (ref 135–145)
TOTAL PROTEIN: 6.2 g/dL — AB (ref 6.5–8.1)

## 2015-11-24 LAB — CBC
HCT: 27.8 % — ABNORMAL LOW (ref 39.0–52.0)
Hemoglobin: 8.5 g/dL — ABNORMAL LOW (ref 13.0–17.0)
MCH: 30.8 pg (ref 26.0–34.0)
MCHC: 30.6 g/dL (ref 30.0–36.0)
MCV: 100.7 fL — ABNORMAL HIGH (ref 78.0–100.0)
Platelets: 104 10*3/uL — ABNORMAL LOW (ref 150–400)
RBC: 2.76 MIL/uL — ABNORMAL LOW (ref 4.22–5.81)
RDW: 16.6 % — AB (ref 11.5–15.5)
WBC: 3.6 10*3/uL — ABNORMAL LOW (ref 4.0–10.5)

## 2015-11-24 LAB — CBG MONITORING, ED: Glucose-Capillary: 133 mg/dL — ABNORMAL HIGH (ref 65–99)

## 2015-11-24 LAB — I-STAT CG4 LACTIC ACID, ED: Lactic Acid, Venous: 1.61 mmol/L (ref 0.5–1.9)

## 2015-11-24 MED ORDER — INSULIN ASPART 100 UNIT/ML ~~LOC~~ SOLN
0.0000 [IU] | Freq: Three times a day (TID) | SUBCUTANEOUS | Status: DC
  Administered 2015-11-24 – 2015-11-26 (×4): 1 [IU] via SUBCUTANEOUS
  Administered 2015-11-27 (×2): 2 [IU] via SUBCUTANEOUS
  Administered 2015-11-29 – 2015-11-30 (×3): 1 [IU] via SUBCUTANEOUS
  Filled 2015-11-24: qty 1

## 2015-11-24 MED ORDER — SODIUM CHLORIDE 0.9 % IV BOLUS (SEPSIS)
500.0000 mL | Freq: Once | INTRAVENOUS | Status: AC
  Administered 2015-11-24: 500 mL via INTRAVENOUS

## 2015-11-24 MED ORDER — LEVOTHYROXINE SODIUM 100 MCG PO TABS
200.0000 ug | ORAL_TABLET | Freq: Every day | ORAL | Status: DC
  Administered 2015-11-24 – 2015-11-30 (×7): 200 ug via ORAL
  Filled 2015-11-24: qty 1
  Filled 2015-11-24 (×6): qty 2

## 2015-11-24 MED ORDER — LEVOTHYROXINE SODIUM 25 MCG PO TABS
25.0000 ug | ORAL_TABLET | Freq: Every day | ORAL | Status: DC
  Administered 2015-11-24 – 2015-11-30 (×7): 25 ug via ORAL
  Filled 2015-11-24 (×7): qty 1

## 2015-11-24 MED ORDER — ATORVASTATIN CALCIUM 40 MG PO TABS
40.0000 mg | ORAL_TABLET | Freq: Every day | ORAL | Status: DC
  Administered 2015-11-25 – 2015-11-30 (×6): 40 mg via ORAL
  Filled 2015-11-24 (×6): qty 1

## 2015-11-24 MED ORDER — FERROUS SULFATE 325 (65 FE) MG PO TABS
325.0000 mg | ORAL_TABLET | Freq: Two times a day (BID) | ORAL | Status: DC
  Administered 2015-11-25 – 2015-11-30 (×13): 325 mg via ORAL
  Filled 2015-11-24 (×12): qty 1

## 2015-11-24 MED ORDER — ONDANSETRON HCL 4 MG/2ML IJ SOLN: 4.0000 mg | Freq: Four times a day (QID) | INTRAMUSCULAR | Status: DC | PRN

## 2015-11-24 MED ORDER — INSULIN ASPART 100 UNIT/ML ~~LOC~~ SOLN
0.0000 [IU] | Freq: Every day | SUBCUTANEOUS | Status: DC
  Administered 2015-11-26: 2 [IU] via SUBCUTANEOUS

## 2015-11-24 MED ORDER — METHENAMINE MANDELATE 0.5 G PO TABS
500.0000 mg | ORAL_TABLET | Freq: Four times a day (QID) | ORAL | Status: DC
  Administered 2015-11-25 – 2015-11-30 (×19): 500 mg via ORAL
  Filled 2015-11-24 (×26): qty 1

## 2015-11-24 MED ORDER — ACETAMINOPHEN 325 MG PO TABS
650.0000 mg | ORAL_TABLET | Freq: Four times a day (QID) | ORAL | Status: DC | PRN
  Administered 2015-11-25 – 2015-11-29 (×6): 650 mg via ORAL
  Filled 2015-11-24 (×5): qty 2

## 2015-11-24 MED ORDER — SENNOSIDES-DOCUSATE SODIUM 8.6-50 MG PO TABS
1.0000 | ORAL_TABLET | Freq: Every evening | ORAL | Status: DC | PRN
  Filled 2015-11-24: qty 1

## 2015-11-24 MED ORDER — TRAMADOL HCL 50 MG PO TABS
50.0000 mg | ORAL_TABLET | Freq: Four times a day (QID) | ORAL | Status: DC | PRN
  Administered 2015-11-24 – 2015-11-30 (×12): 50 mg via ORAL
  Filled 2015-11-24 (×13): qty 1

## 2015-11-24 MED ORDER — ACETAMINOPHEN 650 MG RE SUPP: 650.0000 mg | Freq: Four times a day (QID) | RECTAL | Status: DC | PRN

## 2015-11-24 MED ORDER — INSULIN GLARGINE 100 UNIT/ML ~~LOC~~ SOLN
40.0000 [IU] | Freq: Every day | SUBCUTANEOUS | Status: DC
  Administered 2015-11-25 – 2015-11-30 (×7): 40 [IU] via SUBCUTANEOUS
  Filled 2015-11-24 (×8): qty 0.4

## 2015-11-24 MED ORDER — GABAPENTIN 100 MG PO CAPS
100.0000 mg | ORAL_CAPSULE | Freq: Three times a day (TID) | ORAL | Status: DC
  Administered 2015-11-25 – 2015-11-30 (×17): 100 mg via ORAL
  Filled 2015-11-24 (×18): qty 1

## 2015-11-24 MED ORDER — HEPARIN SODIUM (PORCINE) 5000 UNIT/ML IJ SOLN
5000.0000 [IU] | Freq: Three times a day (TID) | INTRAMUSCULAR | Status: DC
  Administered 2015-11-24 – 2015-11-30 (×15): 5000 [IU] via SUBCUTANEOUS
  Filled 2015-11-24 (×13): qty 1

## 2015-11-24 MED ORDER — ONDANSETRON HCL 4 MG PO TABS: 4.0000 mg | ORAL_TABLET | Freq: Four times a day (QID) | ORAL | Status: DC | PRN

## 2015-11-24 MED ORDER — SEVELAMER CARBONATE 800 MG PO TABS
800.0000 mg | ORAL_TABLET | Freq: Three times a day (TID) | ORAL | Status: DC
  Administered 2015-11-24 – 2015-11-30 (×17): 800 mg via ORAL
  Filled 2015-11-24 (×19): qty 1

## 2015-11-24 MED ORDER — METHENAMINE HIPPURATE 1 G PO TABS: 1.0000 g | ORAL_TABLET | Freq: Two times a day (BID) | ORAL | Status: DC

## 2015-11-24 NOTE — ED Notes (Signed)
Pt. Rolled onto right side. BP not picking up as well. Pt. Placed this way to take weight off sacral wound.

## 2015-11-24 NOTE — ED Notes (Signed)
Pt would not comply with bedside monitor and continued to remove B/P and pulse ox after it was placed on by tech.

## 2015-11-24 NOTE — ED Notes (Signed)
Patient transported to X-ray 

## 2015-11-24 NOTE — Progress Notes (Signed)
PROGRESS NOTE    [name]  QMG:[id number] DOB: [birth year] DOA: 11/23/2015 PCP: Leeanne Rio, PA-C   Brief Narrative:  [name] is a 60 year old gentleman with a past medical history of end-stage renal disease on hemodialysis Tuesdays Thursdays and Saturdays, history of left low the knee amputation, presented to the emergency department overnight with complaints of left knee pain. He reports being in his usual state of health until last Saturday where he had a fall at his dialysis center. Stated landing on his BKA and has had pain for which he started using results that no patches. In the emergency department he was found to be hypotensive with systolic blood pressures in the 80s. Workup did not reveal an obvious source of infection as it was suspected that opioids may have contributed to hypotension. He stated missing dialysis on 09/22/2015 due to knee pain. During this hospitalization nephrology was consulted.    Assessment & Plan:   Principal Problem:   Hypotension, unspecified Active Problems:   Type 2 diabetes mellitus with diabetic nephropathy, with long-term current use of insulin (H[facility])   Hypothyroidism, acquired   Chronic pain   ESRD on dialysis ([place])   S/P BKA (below knee amputation) unilateral ([facility])   Left knee pain   Hypotension   1. Left knee pain  -Unfortunately [name]. Haberle having a fall at his dialysis center landing on his left knee.  -Films obtained in the emergency department did not reveal evidence of acute fracture or dislocation of left knee. Radiology reporting that total knee arthroplasty was grossly unremarkable in appearance without evidence of loosening.  -Continue supportive care.   2.  Hypotension. -She was found to be hypotensive with systolic blood pressures in the 80s, improving over the course of the day -He is nontoxic, afebrile, nonfocal, as workup did not show obvious source of infection -Plan to continue watching him off antimicrobial  therapy -I suspect narcotic analgesics may have precipitated hypotension.  3.  History of end-stage renal disease -He is currently on the Tuesday Thursday Saturday dialysis scheduled -Nephrology consulted as he reports skipping his hemodialysis yesterday. -On exam he does not appear fluid overloaded  4.  Insulin dependent diabetes mellitus -Continue glargine 40 units subcutaneous at bedtime   DVT prophylaxis: Heparin Code Status: Full code  Family Communication:  Disposition Plan: 24 hour observation   Consultants:   Nephrology   Procedures:     Antimicrobials:       Subjective: Complains of ongoing pain to his left knee  Objective: Vitals:   11/24/15 0900 11/24/15 1030 11/24/15 1100 11/24/15 1145  BP: (!) 91/52 96/57 101/57 94/56  Pulse: 83 76 74 74  Resp: 17  16 22   Temp:      TempSrc:      SpO2: 99% 97% 94% 99%    Intake/Output Summary (Last 24 hours) at 11/24/15 1457 Last data filed at 11/24/15 1209  Gross per 24 hour  Intake             1500 ml  Output                0 ml  Net             1500 ml   There were no vitals filed for this visit.  Examination:  General exam: Nontoxic appearing Respiratory system: Clear to auscultation. Respiratory effort normal. Cardiovascular system: S1 & S2 heard, RRR. No JVD, murmurs, rubs, gallops or clicks. No pedal edema. Gastrointestinal system: Abdomen is  nondistended, soft and nontender. No organomegaly or masses felt. Normal bowel sounds heard. Central nervous system: Alert and oriented. No focal neurological deficits. Extremities: status post left below the knee amputation, stump does not appear infected Psychiatry: Judgement and insight appear normal. Mood & affect appropriate.     Data Reviewed: I have personally reviewed following labs and imaging studies  CBC:  Recent Labs Lab 11/24/15 0000 11/24/15 0623  WBC 4.3 3.6*  NEUTROABS 3.1  --   HGB 9.6* 8.5*  HCT 30.9* 27.8*  MCV 101.0* 100.7*  PLT  132* 419*   Basic Metabolic Panel:  Recent Labs Lab 11/24/15 0000 11/24/15 0623  NA 136 138  K 5.5* 5.6*  CL 102 96*  CO2 19* 18*  GLUCOSE 171* 136*  BUN 83* 89*  CREATININE 9.00* 9.37*  CALCIUM 8.1* 8.0*   GFR: CrCl cannot be calculated (Unknown ideal weight.). Liver Function Tests:  Recent Labs Lab 11/24/15 0623  AST 10*  ALT 8*  ALKPHOS 79  BILITOT 1.4*  PROT 6.2*  ALBUMIN 2.5*   No results for input(s): LIPASE, AMYLASE in the last 168 hours. No results for input(s): AMMONIA in the last 168 hours. Coagulation Profile: No results for input(s): INR, PROTIME in the last 168 hours. Cardiac Enzymes: No results for input(s): CKTOTAL, CKMB, CKMBINDEX, TROPONINI in the last 168 hours. BNP (last 3 results) No results for input(s): PROBNP in the last 8760 hours. HbA1C: No results for input(s): HGBA1C in the last 72 hours. CBG:  Recent Labs Lab 11/24/15 1202  GLUCAP 133*   Lipid Profile: No results for input(s): CHOL, HDL, LDLCALC, TRIG, CHOLHDL, LDLDIRECT in the last 72 hours. Thyroid Function Tests: No results for input(s): TSH, T4TOTAL, FREET4, T3FREE, THYROIDAB in the last 72 hours. Anemia Panel: No results for input(s): VITAMINB12, FOLATE, FERRITIN, TIBC, IRON, RETICCTPCT in the last 72 hours. Sepsis Labs:  Recent Labs Lab 11/24/15 0209  LATICACIDVEN 1.61    No results found for this or any previous visit (from the past 240 hour(s)).       Radiology Studies: Dg Ankle Complete Right  Result Date: 11/24/2015 CLINICAL DATA:  Status post fall, with right ankle pain. Initial encounter. EXAM: RIGHT ANKLE - COMPLETE 3+ VIEW COMPARISON:  Right tibia/fibula radiographs performed 11/26/2008 FINDINGS: There is no evidence of acute fracture or dislocation. There is chronic flattening of the ankle mortise and subtalar joint, possibly reflecting remote calcaneal injury or Charcot joint. Diffuse vascular calcifications are seen. Mild soft tissue swelling is noted  at the heel. IMPRESSION: 1. No evidence of acute fracture or dislocation. Chronic flattening of the ankle mortise and subtalar joint, possibly reflecting remote calcaneal injury or Charcot joint. 2. Diffuse vascular calcifications seen. Electronically Signed   By: Garald Balding M.D.   On: 11/24/2015 01:23   Dg Chest Portable 1 View  Result Date: 11/24/2015 CLINICAL DATA:  60 year old male with shortness of breath. History of renal failure. EXAM: PORTABLE CHEST 1 VIEW COMPARISON:  Chest radiograph dated 03/03/2015 FINDINGS: Single portable view of the chest demonstrates stable cardiomegaly. No significant vascular congestion or pulmonary edema. A small focal area of increased density at the left lung base likely superimposition of the ribs. There is no pleural effusion or pneumothorax. There is scoliosis of the thoracic spine. No acute fracture. Right subclavian/axillary vascular stent. IMPRESSION: Stable cardiomegaly without definite congestion or edema. No focal consolidation. Electronically Signed   By: Anner Crete M.D.   On: 11/24/2015 03:24   Dg Knee Complete 4 Views  Left  Result Date: 11/24/2015 CLINICAL DATA:  Status post fall yesterday, with left knee pain. Initial encounter. EXAM: LEFT KNEE - COMPLETE 4+ VIEW COMPARISON:  CT of the abdomen and pelvis performed 04/13/2015 FINDINGS: There is no evidence of acute fracture or dislocation. A total knee arthroplasty with chronic components is noted, without evidence of loosening. The patient is status post below-the-knee amputation. The stump is grossly unremarkable, though not well assessed on radiograph. Scattered vascular calcifications are noted. IMPRESSION: 1. No evidence of acute fracture or dislocation. Total knee arthroplasty is grossly unremarkable in appearance, without evidence of loosening. 2. Scattered vascular calcifications seen. Electronically Signed   By: Garald Balding M.D.   On: 11/24/2015 01:21   Dg Hip Unilat W Or Wo Pelvis 2-3  Views Left  Result Date: 11/24/2015 CLINICAL DATA:  Status post fall yesterday, with left hip pain. Initial encounter. EXAM: DG HIP (WITH OR WITHOUT PELVIS) 2-3V LEFT COMPARISON:  CT of the abdomen and pelvis from 04/13/2015 FINDINGS: There is no evidence of fracture or dislocation. 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 The right hip arthroplasty is grossly unremarkable in appearance. Surrounding lucency is thought to reflect overlying soft tissues. The sacroiliac joints are grossly unremarkable. Mild degenerative change is noted at the lower lumbar spine. The visualized bowel gas pattern is grossly unremarkable in appearance. Scattered vascular calcifications are seen. IMPRESSION: 1. No evidence of fracture or dislocation. 2. 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 3. Scattered vascular calcifications seen. Electronically Signed   By: Garald Balding M.D.   On: 11/24/2015 01:19        Scheduled Meds: . atorvastatin  40 mg Oral q1800  . BASAGLAR KWIKPEN  40 Units Subcutaneous QHS  . ferrous sulfate  325 mg Oral BID  . gabapentin  100 mg Oral TID  . heparin  5,000 Units Subcutaneous Q8H  . insulin aspart  0-5 Units Subcutaneous QHS  . insulin aspart  0-9 Units Subcutaneous TID WC  . levothyroxine  200 mcg Oral QAC breakfast  . levothyroxine  25 mcg Oral QAC breakfast  . methenamine  1 g Oral BID  . sevelamer carbonate  800 mg Oral TID WC   Continuous Infusions:    LOS: 0 days    Time spent:     Kelvin Cellar, MD [facility] Pager [phone]  If 7PM-7AM, please contact night-coverage [email or web address] Password [facility] 11/24/2015, 2:57 PM

## 2015-11-24 NOTE — Progress Notes (Signed)
Patient received to room 6E28 from ED.   Introduced to staff and unit routine.  Bed in low position and locked.

## 2015-11-24 NOTE — ED Notes (Signed)
Pt. Moved to hospital bed at this time for comfort.

## 2015-11-24 NOTE — H&P (Signed)
History and Physical  Patient Name: [name]     RXV:[id number]    DOB: [birth year]    DOA: 11/23/2015 PCP: Leeanne Rio, PA-C   Patient coming from: Home via EMS  Chief Complaint: Knee pain  HPI: [name] is a 60 y.o. male with a past medical history significant for ESRD on HD TThS, chronic pain no longer on opioids, IDDM, and left BKA who presents with left knee pain after a fall and is found incidentally to be hypotensive.  The patient was in his usual state of health until Saturday when he was at dialysis, making a transfer back into his wheelchair and fell, landing on his left side where his BKA is. After the fall he developed severe constant pain in the left buttock, hip, and knee.  He found his old fentanyl patches and put them on to treat the pain, but today was still and so much pain that he was not able to go to dialysis, and so he came to the ER in the evening.  ED course: -Afebrile, heart rate 90s, respirations normal, blood pressure 80/50 -Na 136, K 5.5, Cr 9, WBC4.3K, Hgb9.6, lactate normal -radiographs of the left hip, left knee, and right ankle showed no fracture or dislocation of hardware -Chest x-ray negative -His fentanyl patch was removed because of suspicion that this might be contributing to his low blood pressure -He was monitored for another 3 hours, and then TRH Rask to observe given unexplained hypotension  At no point did he have fever, chills, cough, sputum. He had no hematuria from his urostomy. He had no redness or swelling of the left knee. He had no abdominal pain, bloody diarrhea, vomiting (only a few loose stools yesterday).        ROS: Review of Systems  Constitutional: Negative for chills, diaphoresis, fever and malaise/fatigue.  Respiratory: Negative for cough, sputum production and shortness of breath.   Gastrointestinal: Positive for diarrhea (only 2 loose stools yesterday). Negative for abdominal pain, nausea and vomiting.    Genitourinary: Negative for hematuria.       Only urine is in urostomy  Musculoskeletal: Positive for falls (at dialysis, that started episode) and joint pain (left knee).  Neurological: Negative for loss of consciousness and weakness.  All other systems reviewed and are negative.         Past Medical History:  Diagnosis Date  . Acute respiratory failure ([place])   . Anemia   . Anxiety   . AR (allergic rhinitis)   . Arthritis    osteoarthritis  . Blood transfusion   . BPH (benign prostatic hypertrophy)   . Cellulitis and abscess of leg   . Cholelithiasis   . Chronic kidney disease    hx of kidney stones  . Chronic kidney disease, stage III (moderate)   . Chronic pain   . Diabetes mellitus    insulin dependent  . Displacement of lumbar intervertebral disc without myelopathy    L4-L5  . Diverticulosis of colon   . DVT (deep venous thrombosis) (HCC)    Lower Extremity  . Embolism and thrombosis of unspecified artery ([place])   . Gallstone   . History of nephrolithiasis    Bilateral  . Hyperlipidemia   . Hypertension   . Hypertrophy of prostate   . Hypothyroidism   . Impotence of organic origin   . Morbid obesity ([place])   . Nephrolithiasis   . Other lymphedema   . Pancreatitis, acute   .  Peripheral vascular disease ([place])   . Personal history of arthritis    Osteoarthritis  . Pneumonia   . Sleep apnea    uses cpap  . Unspecified septicemia     Past Surgical History:  Procedure Laterality Date  . AMPUTATION  02/07/2011   Procedure: AMPUTATION BELOW KNEE;  Surgeon: Newt Minion, MD;  Location: [facility];  Service: Orthopedics;  Laterality: Left;  Left Below Knee Amputation  . bilteral hip     replacement & revison  's   . CYSTOSCOPY W/ RETROGRADES Bilateral 10/24/2012   Procedure: CYSTOSCOPY WITH RETROGRADE PYELOGRAM Procedure: Cystoscopy, Bilateral Retrogarde Pyelograms, Bladder Biopsy, Hydrodistension;  Surgeon: Franchot Gallo, MD;  Location: [facility];  Service:  Urology;  Laterality: Bilateral;  . KIDNEY SURGERY    . KNEE SURGERY     left knee    . LEG AMPUTATION BELOW KNEE    . REPLACEMENT TOTAL KNEE    . TONSILLECTOMY      Social History: Patient lives with his wife.  The patient is wheelchair bound.  He used to work for [employer].  He does not smoke  No Known Allergies  Family history:  family history includes Arthritis in his paternal grandmother; Colon cancer in his father; Dementia in his father; Diabetes in his mother; Heart attack in his father and mother; Hypertension in his other; Hypothyroidism in his other; Stroke in his mother.  Prior to Admission medications   Medication Sig Start Date End Date Taking? Authorizing Provider  Ascorbic Acid (VITAMIN C PO) Take 1 tablet by mouth 2 (two) times daily.   Yes Historical Provider, MD  atorvastatin (LIPITOR) 40 MG tablet TAKE 1 TABLET (40 MG TOTAL) BY MOUTH DAILY. 09/13/15  Yes Brunetta Jeans, PA-C  fentaNYL (DURAGESIC - DOSED MCG/HR) 50 MCG/HR Place 1 patch (50 mcg total) onto the skin every 3 (three) days. 05/19/15  Yes Brunetta Jeans, PA-C  ferrous sulfate 325 (65 FE) MG tablet Take 1 tablet (325 mg total) by mouth 2 (two) times daily. 08/10/14  Yes Brunetta Jeans, PA-C  gabapentin (NEURONTIN) 100 MG capsule TAKE 1 CAPSULE (100 MG TOTAL) BY MOUTH 3 (THREE) TIMES DAILY. 08/25/15  Yes Brunetta Jeans, PA-C  insulin aspart (NOVOLOG) 100 UNIT/ML injection INJECT 3 UNITS THREE TIMES DAILY WITH MEALS. AND AT NIGHTIME IF BLOOD SUGARS GREATER THAN 150 DX: E11.29 Patient taking differently: Inject 1-9 Units into the skin 3 (three) times daily with meals. I IF BLOOD SUGARS GREATER THAN 150 DX: E11.29 08/10/14  Yes Brunetta Jeans, PA-C  Insulin Glargine (BASAGLAR KWIKPEN) 100 UNIT/ML SOPN Inject 40 Units into the skin at bedtime. 09/23/15  Yes Historical Provider, MD  Insulin Syringe-Needle U-100 (B-D INS SYRINGE 0.5CC/31GX5/16) 31G X 5/16" 0.5 ML MISC USE 4 TIMES A DAY FOR DIABETES  BLOOD GLUCOSE TESTING DX: E11.29 08/10/14  Yes Brunetta Jeans, PA-C  levothyroxine (SYNTHROID, LEVOTHROID) 200 MCG tablet TAKE 1 TABLET BY MOUTH EVERY MORNING 09/08/15  Yes Brunetta Jeans, PA-C  levothyroxine (SYNTHROID, LEVOTHROID) 25 MCG tablet TAKE 1 TABLET (25 MCG TOTAL) BY MOUTH DAILY BEFORE BREAKFAST. 09/13/15  Yes Brunetta Jeans, PA-C  methenamine (HIPREX) 1 g tablet Take 1 g by mouth 2 (two) times daily. 09/27/15  Yes Historical Provider, MD  Multiple Vitamins-Minerals (MULTIVITAMINS THER. W/MINERALS) TABS Take 1 tablet by mouth daily.    Yes Historical Provider, MD  sevelamer carbonate (RENVELA) 800 MG tablet Take 800 mg by mouth 3 (three) times daily with meals.  Yes Historical Provider, MD  BAYER CONTOUR TEST test strip USE AS DIRECTED TO CHECK BLOOD SUGARS TWICE DAILY DX: E11.9 10/11/15   Brunetta Jeans, PA-C  LANTUS 100 UNIT/ML injection INJECT 0.4 MLS (40 UNITS TOTAL) INTO THE SKIN AT BEDTIME. Patient not taking: Reported on 11/24/2015 07/05/15   Brunetta Jeans, PA-C       Physical Exam: BP (!) 89/64   Pulse 81   Temp 98.3 F (36.8 C) (Oral)   Resp 19   SpO2 97%  General appearance: Overweight, adult male, alert and in no acute distress, but in moderate pain.   Eyes: Anicteric, conjunctiva pink, lids and lashes normal. PERRL.    ENT: No nasal deformity, discharge, epistaxis.  Hearing normal. OP moist without lesions.   Neck: No neck masses.  Trachea midline.  No thyromegaly/tenderness. Lymph: No cervical or supraclavicular lymphadenopathy. Skin: Warm and dry.  No jaundice.  No suspicious rashes or lesions.  There is hyperpigmentation at left BKA stump, no erosions, lesions, purulence, drainage, wounds.  No tenderness to palpation over hyperpigmentation, patient is unable to see it to confirm if it is or isn't new. Cardiac: RRR, nl S1-S2, no murmurs appreciated.  Capillary refill is brisk.  JVP normal.  No LE edema on right.  Radial pulses 2+ and symmetric. Respiratory:  Normal respiratory rate and rhythm.  CTAB without rales or wheezes. Abdomen: Abdomen soft.  No TTP or guarding. No ascites, distension, hepatosplenomegaly.   MSK: No deformities or effusions in left knee.  Pain to palpation over left knee.  No cyanosis or clubbing. Neuro: Cranial nerves normal.  Sensation intact to light touch. Speech is fluent.  Muscle strength globally diminished but symmetric.Marland Kitchen    Psych: Sensorium intact and responding to questions, attention normal.  Behavior appropriate.  Affect normal.  Judgment and insight appear normal.     Labs on Admission:  I have personally reviewed following labs and imaging studies: CBC:  Recent Labs Lab 11/24/15 0000  WBC 4.3  NEUTROABS 3.1  HGB 9.6*  HCT 30.9*  MCV 101.0*  PLT 195*   Basic Metabolic Panel:  Recent Labs Lab 11/24/15 0000  NA 136  K 5.5*  CL 102  CO2 19*  GLUCOSE 171*  BUN 83*  CREATININE 9.00*  CALCIUM 8.1*   GFR: CrCl cannot be calculated (Unknown ideal weight.).  Liver Function Tests: No results for input(s): AST, ALT, ALKPHOS, BILITOT, PROT, ALBUMIN in the last 168 hours. No results for input(s): LIPASE, AMYLASE in the last 168 hours. No results for input(s): AMMONIA in the last 168 hours. Coagulation Profile: No results for input(s): INR, PROTIME in the last 168 hours. Cardiac Enzymes: No results for input(s): CKTOTAL, CKMB, CKMBINDEX, TROPONINI in the last 168 hours. BNP (last 3 results) No results for input(s): PROBNP in the last 8760 hours. HbA1C: No results for input(s): HGBA1C in the last 72 hours. CBG: No results for input(s): GLUCAP in the last 168 hours. Lipid Profile: No results for input(s): CHOL, HDL, LDLCALC, TRIG, CHOLHDL, LDLDIRECT in the last 72 hours. Thyroid Function Tests: No results for input(s): TSH, T4TOTAL, FREET4, T3FREE, THYROIDAB in the last 72 hours. Anemia Panel: No results for input(s): VITAMINB12, FOLATE, FERRITIN, TIBC, IRON, RETICCTPCT in the last 72  hours. Sepsis Labs: Lactic acid normal Invalid input(s): PROCALCITONIN, LACTICIDVEN No results found for this or any previous visit (from the past 240 hour(s)).       Radiological Exams on Admission: CXR shows no focal opacity or pneumonia.  Personally  reviewed the following reports: Dg Ankle Complete Right  Result Date: 11/24/2015 CLINICAL DATA:  Status post fall, with right ankle pain. Initial encounter. EXAM: RIGHT ANKLE - COMPLETE 3+ VIEW COMPARISON:  Right tibia/fibula radiographs performed 11/26/2008 FINDINGS: There is no evidence of acute fracture or dislocation. There is chronic flattening of the ankle mortise and subtalar joint, possibly reflecting remote calcaneal injury or Charcot joint. Diffuse vascular calcifications are seen. Mild soft tissue swelling is noted at the heel. IMPRESSION: 1. No evidence of acute fracture or dislocation. Chronic flattening of the ankle mortise and subtalar joint, possibly reflecting remote calcaneal injury or Charcot joint. 2. Diffuse vascular calcifications seen. Electronically Signed   By: Garald Balding M.D.   On: 11/24/2015 01:23   Dg Chest Portable 1 View  Result Date: 11/24/2015 CLINICAL DATA:  60 year old male with shortness of breath. History of renal failure. EXAM: PORTABLE CHEST 1 VIEW COMPARISON:  Chest radiograph dated 03/03/2015 FINDINGS: Single portable view of the chest demonstrates stable cardiomegaly. No significant vascular congestion or pulmonary edema. A small focal area of increased density at the left lung base likely superimposition of the ribs. There is no pleural effusion or pneumothorax. There is scoliosis of the thoracic spine. No acute fracture. Right subclavian/axillary vascular stent. IMPRESSION: Stable cardiomegaly without definite congestion or edema. No focal consolidation. Electronically Signed   By: Anner Crete M.D.   On: 11/24/2015 03:24   Dg Knee Complete 4 Views Left  Result Date: 11/24/2015 CLINICAL DATA:   Status post fall yesterday, with left knee pain. Initial encounter. EXAM: LEFT KNEE - COMPLETE 4+ VIEW COMPARISON:  CT of the abdomen and pelvis performed 04/13/2015 FINDINGS: There is no evidence of acute fracture or dislocation. A total knee arthroplasty with chronic components is noted, without evidence of loosening. The patient is status post below-the-knee amputation. The stump is grossly unremarkable, though not well assessed on radiograph. Scattered vascular calcifications are noted. IMPRESSION: 1. No evidence of acute fracture or dislocation. Total knee arthroplasty is grossly unremarkable in appearance, without evidence of loosening. 2. Scattered vascular calcifications seen. Electronically Signed   By: Garald Balding M.D.   On: 11/24/2015 01:21   Dg Hip Unilat W Or Wo Pelvis 2-3 Views Left  Result Date: 11/24/2015 CLINICAL DATA:  Status post fall yesterday, with left hip pain. Initial encounter. EXAM: DG HIP (WITH OR WITHOUT PELVIS) 2-3V LEFT COMPARISON:  CT of the abdomen and pelvis from 04/13/2015 FINDINGS: There is no evidence of fracture or dislocation. 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 The right hip arthroplasty is grossly unremarkable in appearance. Surrounding lucency is thought to reflect overlying soft tissues. The sacroiliac joints are grossly unremarkable. Mild degenerative change is noted at the lower lumbar spine. The visualized bowel gas pattern is grossly unremarkable in appearance. Scattered vascular calcifications are seen. IMPRESSION: 1. No evidence of fracture or dislocation. 2. 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 3. Scattered vascular calcifications seen. Electronically Signed   By: Garald Balding M.D.   On: 11/24/2015 01:19    EKG: Independently reviewed. Rate 89, QTc normal, PR interval long.  No ST or T wave changes.    Assessment/Plan 1.  Hypotension:  Unclear cause. BP 440-347 systolic at recent PCP appointments. Does not take antihypertensives.  Recently restarted fentanyl patch, ?hypotensive effect. Sepsis is doubted.  History without prominent volume loss reported.   -Gentle IVF now -Monitor BP -Hold fentanyl -Culture  if develops fever   2. Left knee pain:  No fractures or dislodgment of hardware.  Suspect contusion.  Pain out of proportion to exam.   -Tramdadol and acetaminophen for pain  3. IDDM:  -Continue Lantus and statin -SSI  4. ESRD on HD TThS:  -Continue Renvela  5. Hypothyroidism:  -Continue levothyroxine  6. Chronic pain:  -Continue gabapentin  7. Urostomy tube in place: -Continue WOC -Continue methenamine -Follow UA and urine culture which are still pending       DVT prophylaxis: Heparin  Code Status: FULL  Family Communication: None present  Disposition Plan: Anticipate gentle fluids and monitor off fentanyl. If BP resolves, discharge to home this evening or tomorrow with HD on [place]. Consults called: None Admission status: OBS, med surg        Medical decision making: Patient seen at 4:26 AM on 11/24/2015.  The patient was discussed with Dr. Christy Gentles.  What exists of the patient's chart was reviewed in depth and summarized above.  Clinical condition: hypotensive but mentating well, appears comfortable with exception of kene pain, heart rate normal, no evidence of end organ damage.        Edwin Dada [facility] Pager [phone]

## 2015-11-24 NOTE — ED Notes (Signed)
Provider at bedside

## 2015-11-24 NOTE — ED Notes (Signed)
Attempted report 

## 2015-11-24 NOTE — ED Notes (Signed)
Notified Dr. Loleta Books of SBP 82 after efforts of repositioning patient and NS 500 bolus. Patient drowsy/sleepy, but easily arousable. AOx4. Answers questions appropriately. New order to be placed for additional NS 500 bolus.

## 2015-11-25 DIAGNOSIS — D631 Anemia in chronic kidney disease: Secondary | ICD-10-CM | POA: Diagnosis not present

## 2015-11-25 DIAGNOSIS — M25562 Pain in left knee: Secondary | ICD-10-CM | POA: Diagnosis not present

## 2015-11-25 DIAGNOSIS — N2581 Secondary hyperparathyroidism of renal origin: Secondary | ICD-10-CM | POA: Diagnosis not present

## 2015-11-25 DIAGNOSIS — N186 End stage renal disease: Secondary | ICD-10-CM | POA: Diagnosis not present

## 2015-11-25 DIAGNOSIS — Z992 Dependence on renal dialysis: Secondary | ICD-10-CM | POA: Diagnosis not present

## 2015-11-25 DIAGNOSIS — I959 Hypotension, unspecified: Secondary | ICD-10-CM | POA: Diagnosis not present

## 2015-11-25 DIAGNOSIS — Z89512 Acquired absence of left leg below knee: Secondary | ICD-10-CM | POA: Diagnosis not present

## 2015-11-25 LAB — RENAL FUNCTION PANEL
Albumin: 2.6 g/dL — ABNORMAL LOW (ref 3.5–5.0)
Anion gap: 19 — ABNORMAL HIGH (ref 5–15)
BUN: 108 mg/dL — AB (ref 6–20)
CHLORIDE: 98 mmol/L — AB (ref 101–111)
CO2: 17 mmol/L — ABNORMAL LOW (ref 22–32)
Calcium: 7.9 mg/dL — ABNORMAL LOW (ref 8.9–10.3)
Creatinine, Ser: 10.33 mg/dL — ABNORMAL HIGH (ref 0.61–1.24)
GFR calc Af Amer: 6 mL/min — ABNORMAL LOW (ref >60)
GFR, EST NON AFRICAN AMERICAN: 5 mL/min — AB (ref >60)
GLUCOSE: 124 mg/dL — AB (ref 65–99)
POTASSIUM: 6.5 mmol/L — AB (ref 3.5–5.1)
Phosphorus: 6.9 mg/dL — ABNORMAL HIGH (ref 2.5–4.6)
Sodium: 134 mmol/L — ABNORMAL LOW (ref 135–145)

## 2015-11-25 LAB — CBC
HEMATOCRIT: 28.5 % — AB (ref 39.0–52.0)
HEMOGLOBIN: 8.8 g/dL — AB (ref 13.0–17.0)
MCH: 30.8 pg (ref 26.0–34.0)
MCHC: 30.9 g/dL (ref 30.0–36.0)
MCV: 99.7 fL (ref 78.0–100.0)
Platelets: 110 10*3/uL — ABNORMAL LOW (ref 150–400)
RBC: 2.86 MIL/uL — ABNORMAL LOW (ref 4.22–5.81)
RDW: 16.3 % — AB (ref 11.5–15.5)
WBC: 3.7 10*3/uL — AB (ref 4.0–10.5)

## 2015-11-25 LAB — GLUCOSE, CAPILLARY
GLUCOSE-CAPILLARY: 102 mg/dL — AB (ref 65–99)
GLUCOSE-CAPILLARY: 123 mg/dL — AB (ref 65–99)
Glucose-Capillary: 127 mg/dL — ABNORMAL HIGH (ref 65–99)
Glucose-Capillary: 113 mg/dL — ABNORMAL HIGH (ref 65–99)
Glucose-Capillary: 119 mg/dL — ABNORMAL HIGH (ref 65–99)

## 2015-11-25 LAB — MRSA PCR SCREENING: MRSA by PCR: POSITIVE — AB

## 2015-11-25 LAB — URIC ACID: Uric Acid, Serum: 8.3 mg/dL — ABNORMAL HIGH (ref 4.4–7.6)

## 2015-11-25 MED ORDER — SODIUM CHLORIDE 0.9 % IV BOLUS (SEPSIS)
500.0000 mL | Freq: Once | INTRAVENOUS | Status: AC
  Administered 2015-11-25: 500 mL via INTRAVENOUS

## 2015-11-25 MED ORDER — LIDOCAINE-PRILOCAINE 2.5-2.5 % EX CREA: 1.0000 "application " | TOPICAL_CREAM | CUTANEOUS | Status: DC | PRN

## 2015-11-25 MED ORDER — PENTAFLUOROPROP-TETRAFLUOROETH EX AERO: 1.0000 "application " | INHALATION_SPRAY | CUTANEOUS | Status: DC | PRN

## 2015-11-25 MED ORDER — INFLUENZA VAC SPLIT QUAD 0.5 ML IM SUSY: 0.5000 mL | PREFILLED_SYRINGE | INTRAMUSCULAR | Status: DC

## 2015-11-25 MED ORDER — SODIUM CHLORIDE 0.9 % IV SOLN: 100.0000 mL | INTRAVENOUS | Status: DC | PRN

## 2015-11-25 MED ORDER — HEPARIN SODIUM (PORCINE) 1000 UNIT/ML DIALYSIS: 1000.0000 [IU] | INTRAMUSCULAR | Status: DC | PRN

## 2015-11-25 MED ORDER — LIDOCAINE HCL (PF) 1 % IJ SOLN: 5.0000 mL | INTRAMUSCULAR | Status: DC | PRN

## 2015-11-25 MED ORDER — ALTEPLASE 2 MG IJ SOLR: 2.0000 mg | Freq: Once | INTRAMUSCULAR | Status: DC | PRN

## 2015-11-25 MED ORDER — HEPARIN SODIUM (PORCINE) 1000 UNIT/ML DIALYSIS
6000.0000 [IU] | INTRAMUSCULAR | Status: DC | PRN
  Administered 2015-11-25: 6000 [IU] via INTRAVENOUS_CENTRAL
  Filled 2015-11-25 (×2): qty 6

## 2015-11-25 NOTE — Consult Note (Signed)
Reason for Consult: To manage dialysis and dialysis related needs Referring Physician: [name] is an 60 y.o. male.  HPI: Pt is a 25M with ESRD on HD at [facility] on [place], BPH, urostomy, s/p L BKA who presented for knee pain and was found to be hypotensive.    Pt fell out of his wheelchair Saturday after dialysis and started having L hip and knee pain.  Put on fentanyl patches.  Went to ED and was found to be hypotensive.    Pt reports knee pain.  Does note that he has cloudy urine from his urostomy which is longstanding.  Dialyzes at [facility]  EDW 252 lbs TTS HD 4 hrs 25 min Bath 2K / 2.5 Ca Dialyzer F200, Heparin 6000 bolus then 700 u/ hr . Access LUE AVF Aranesp 120 mcg q Thursday Renvela 800 mg 2 tabs with meals and snacks No IV iron  Past Medical History:  Diagnosis Date  . Acute respiratory failure ([place])   . Anemia   . Anxiety   . AR (allergic rhinitis)   . Arthritis    osteoarthritis  . Blood transfusion   . BPH (benign prostatic hypertrophy)   . Cellulitis and abscess of leg   . Cholelithiasis   . Chronic kidney disease    hx of kidney stones  . Chronic kidney disease, stage III (moderate)   . Chronic pain   . Diabetes mellitus    insulin dependent  . Displacement of lumbar intervertebral disc without myelopathy    L4-L5  . Diverticulosis of colon   . DVT (deep venous thrombosis) (HCC)    Lower Extremity  . Embolism and thrombosis of unspecified artery ([place])   . Gallstone   . History of nephrolithiasis    Bilateral  . Hyperlipidemia   . Hypertension   . Hypertrophy of prostate   . Hypothyroidism   . Impotence of organic origin   . Morbid obesity ([place])   . Nephrolithiasis   . Other lymphedema   . Pancreatitis, acute   . Peripheral vascular disease ([place])   . Personal history of arthritis    Osteoarthritis  . Pneumonia   . Sleep apnea    uses cpap  . Unspecified septicemia     Past Surgical History:  Procedure  Laterality Date  . AMPUTATION  02/07/2011   Procedure: AMPUTATION BELOW KNEE;  Surgeon: Newt Minion, MD;  Location: [facility];  Service: Orthopedics;  Laterality: Left;  Left Below Knee Amputation  . bilteral hip     replacement & revison  's   . CYSTOSCOPY W/ RETROGRADES Bilateral 10/24/2012   Procedure: CYSTOSCOPY WITH RETROGRADE PYELOGRAM Procedure: Cystoscopy, Bilateral Retrogarde Pyelograms, Bladder Biopsy, Hydrodistension;  Surgeon: Franchot Gallo, MD;  Location: [facility];  Service: Urology;  Laterality: Bilateral;  . KIDNEY SURGERY    . KNEE SURGERY     left knee    . LEG AMPUTATION BELOW KNEE    . REPLACEMENT TOTAL KNEE    . TONSILLECTOMY      Family History  Problem Relation Age of Onset  . Diabetes Mother   . Heart attack Mother   . Stroke Mother   . Colon cancer Father   . Dementia Father   . Heart attack Father   . Arthritis Paternal Grandmother   . Hypertension Other   . Hypothyroidism Other     Social History:  reports that he has never smoked. He has never used smokeless tobacco. He reports  that he does not drink alcohol or use drugs.  Allergies: No Known Allergies  Medications: I have reviewed the patient's current medications.   Results for orders placed or performed during the hospital encounter of 11/23/15 (from the past 48 hour(s))  CBG monitoring, ED     Status: Abnormal   Collection Time: 11/24/15 12:02 PM  Result Value Ref Range   Glucose-Capillary 133 (H) 65 - 99 mg/dL  Glucose, capillary     Status: Abnormal   Collection Time: 11/25/15 12:40 AM  Result Value Ref Range   Glucose-Capillary 123 (H) 65 - 99 mg/dL  MRSA PCR Screening     Status: Abnormal   Collection Time: 11/25/15  4:48 AM  Result Value Ref Range   MRSA by PCR POSITIVE (A) NEGATIVE    Comment:        The GeneXpert MRSA Assay (FDA approved for NASAL specimens only), is one component of a comprehensive MRSA colonization surveillance program. It is not intended to diagnose  MRSA infection nor to guide or monitor treatment for MRSA infections. RESULT CALLED TO, READ BACK BY AND VERIFIED WITH: E.STREET RN AT 0745 11/25/15 BY A.DAVIS   Glucose, capillary     Status: Abnormal   Collection Time: 11/25/15  7:17 AM  Result Value Ref Range   Glucose-Capillary 127 (H) 65 - 99 mg/dL  Uric acid     Status: Abnormal   Collection Time: 11/25/15  8:30 AM  Result Value Ref Range   Uric Acid, Serum 8.3 (H) 4.4 - 7.6 mg/dL  CBC     Status: Abnormal   Collection Time: 11/25/15  8:30 AM  Result Value Ref Range   WBC 3.7 (L) 4.0 - 10.5 K/uL   RBC 2.86 (L) 4.22 - 5.81 MIL/uL   Hemoglobin 8.8 (L) 13.0 - 17.0 g/dL   HCT 28.5 (L) 39.0 - 52.0 %   MCV 99.7 78.0 - 100.0 fL   MCH 30.8 26.0 - 34.0 pg   MCHC 30.9 30.0 - 36.0 g/dL   RDW 16.3 (H) 11.5 - 15.5 %   Platelets 110 (L) 150 - 400 K/uL    Comment: SPECIMEN CHECKED FOR CLOTS REPEATED TO VERIFY CONSISTENT WITH PREVIOUS RESULT   Renal function panel     Status: Abnormal   Collection Time: 11/25/15  9:00 AM  Result Value Ref Range   Sodium 134 (L) 135 - 145 mmol/L   Potassium 6.5 (HH) 3.5 - 5.1 mmol/L    Comment: NO VISIBLE HEMOLYSIS CRITICAL RESULT CALLED TO, READ BACK BY AND VERIFIED WITH: H.STREET,RN 1023 11/25/15 CLARK,S    Chloride 98 (L) 101 - 111 mmol/L   CO2 17 (L) 22 - 32 mmol/L   Glucose, Bld 124 (H) 65 - 99 mg/dL   BUN 108 (H) 6 - 20 mg/dL   Creatinine, Ser 10.33 (H) 0.61 - 1.24 mg/dL   Calcium 7.9 (L) 8.9 - 10.3 mg/dL   Phosphorus 6.9 (H) 2.5 - 4.6 mg/dL   Albumin 2.6 (L) 3.5 - 5.0 g/dL   GFR calc non Af Amer 5 (L) >60 mL/min   GFR calc Af Amer 6 (L) >60 mL/min    Comment: (NOTE) The eGFR has been calculated using the CKD EPI equation. This calculation has not been validated in all clinical situations. eGFR's persistently <60 mL/min signify possible Chronic Kidney Disease.    Anion gap 19 (H) 5 - 15  Glucose, capillary     Status: Abnormal   Collection Time: 11/25/15 12:12 PM  Result Value  Ref  Range   Glucose-Capillary 113 (H) 65 - 99 mg/dL  Glucose, capillary     Status: Abnormal   Collection Time: 11/25/15  5:50 PM  Result Value Ref Range   Glucose-Capillary 119 (H) 65 - 99 mg/dL  Glucose, capillary     Status: Abnormal   Collection Time: 11/25/15  9:39 PM  Result Value Ref Range   Glucose-Capillary 102 (H) 65 - 99 mg/dL  Glucose, capillary     Status: Abnormal   Collection Time: 11/26/15  7:15 AM  Result Value Ref Range   Glucose-Capillary 119 (H) 65 - 99 mg/dL    No results found.  ROS: All other systems reviewed and are negaive  Blood pressure (!) 86/53, pulse 77, temperature 97.5 F (36.4 C), temperature source Oral, resp. rate 16, height '5\' 11"'  (1.803 m), weight 118.8 kg (261 lb 14.5 oz), SpO2 93 %. .  GEN: chronically ill-appearing, sleeping but easily arousable HEENT: sclerae anicteric PULM: CTAB no c/w/r CV : RRR soft systolic murmur ABD: urostomy in RLQ drainin yellow cloudy urine with mucus chunks EXT: s/p L BKA NEURO: AAO x 3  Assessment/Plan: 1 hypotension: perhaps due to Fentanyl patches which have been removed.  Consider infectious etiologies 2 ESRD: TTS.  Providing HD on normal schedule 3 Hypertension: holding home BP meds 4. Anemia of ESRD: on Aranesp 120 mcg q week starting today 9/28, Hgb 8.8, will add iron studies 5. Metabolic Bone Disease: on Renvela as OP , no VDRA listed on meds, Phos 6.9 6.  Knee pain: per primary  Madelon Lips MD [facility]  Cell: [phone] Pgr: [phone]

## 2015-11-25 NOTE — Care Management Obs Status (Signed)
[place] NOTIFICATION   Patient Details  Name: [name] MRN: [id number] Date of Birth: 07/15/1955   Medicare Observation Status Notification Given:  Yes Notification given and discussed with pt who is having to much pain to sign.    Abhi Moccia, Rory Percy, RN 11/25/2015, 12:41 PM

## 2015-11-25 NOTE — Care Management Obs Status (Signed)
[facility] NOTIFICATION   Patient Details  Name: [name] MRN: [id number] Date of Birth: 1955/05/19   Medicare Observation Status Notification Given:  Yes Notification given and explained, pt unable to sign due to discomfort.    Traniya Prichett, Rory Percy, RN 11/25/2015, 12:07 PM

## 2015-11-25 NOTE — Progress Notes (Signed)
Pt BP has been running low. Current BP 87/51. Had 3L removed from Dialysis tody. Notified Dr. Baltazar Najjar. Waiting on orders. Isac Caddy, RN

## 2015-11-25 NOTE — Consult Note (Addendum)
[facility] Nurse ostomy consult note Reason for Consult: Consult requested for urostomy pouch supplies.  Pt states he had surgery at [facility] more than a year ago, and he is independent with pouch application and emptying when at home.  He  uses a one piece Convatec pouch, which is not available in the [place], and a barrier ring.  Stoma red and viable when visualized through pouch to right abd, which is currently intact with a good seal.  Pt declines offer for pouch change to be performed.  States, "it is on there fine and I don't need a new one now."  Encouraged patient to call for bedside nurse assistance for pouch emptying to avoid overfilling while in the hospital.  He declines offer to connect to a bedside drainage bag.  Urine is cloudy and green-tinged.  He states this was the appearance prior to admission and his urologist is aware. Ostomy supplies ordered to the bedside for staff nurse/patient use when he desires a pouch change; one piece urostomy pouch and barrier ring to maintain seal.  Pt denies further questions or need for assistance. Please re-consult if further assistance is needed.  Thank-you,  Julien Girt MSN, [place], [place], [place], [place]

## 2015-11-25 NOTE — Consult Note (Signed)
ORTHOPAEDIC CONSULTATION  REQUESTING PHYSICIAN: Kelvin Cellar, MD  Chief Complaint: Left BKA stump pain.  HPI: [name] is a 60 y.o. male who presents with left BKA stump pain after fall on Saturday. Patient states that he fell on his buttocks but had a hyperflexion injury to the left knees sustaining blunt trauma to the transtibial amputation residual limb.  Past Medical History:  Diagnosis Date  . Acute respiratory failure ([place])   . Anemia   . Anxiety   . AR (allergic rhinitis)   . Arthritis    osteoarthritis  . Blood transfusion   . BPH (benign prostatic hypertrophy)   . Cellulitis and abscess of leg   . Cholelithiasis   . Chronic kidney disease    hx of kidney stones  . Chronic kidney disease, stage III (moderate)   . Chronic pain   . Diabetes mellitus    insulin dependent  . Displacement of lumbar intervertebral disc without myelopathy    L4-L5  . Diverticulosis of colon   . DVT (deep venous thrombosis) (HCC)    Lower Extremity  . Embolism and thrombosis of unspecified artery ([place])   . Gallstone   . History of nephrolithiasis    Bilateral  . Hyperlipidemia   . Hypertension   . Hypertrophy of prostate   . Hypothyroidism   . Impotence of organic origin   . Morbid obesity ([place])   . Nephrolithiasis   . Other lymphedema   . Pancreatitis, acute   . Peripheral vascular disease ([place])   . Personal history of arthritis    Osteoarthritis  . Pneumonia   . Sleep apnea    uses cpap  . Unspecified septicemia    Past Surgical History:  Procedure Laterality Date  . AMPUTATION  02/07/2011   Procedure: AMPUTATION BELOW KNEE;  Surgeon: Newt Minion, MD;  Location: [facility];  Service: Orthopedics;  Laterality: Left;  Left Below Knee Amputation  . bilteral hip     replacement & revison  's   . CYSTOSCOPY W/ RETROGRADES Bilateral 10/24/2012   Procedure: CYSTOSCOPY WITH RETROGRADE PYELOGRAM Procedure: Cystoscopy, Bilateral Retrogarde Pyelograms, Bladder Biopsy,  Hydrodistension;  Surgeon: Franchot Gallo, MD;  Location: [facility];  Service: Urology;  Laterality: Bilateral;  . KIDNEY SURGERY    . KNEE SURGERY     left knee    . LEG AMPUTATION BELOW KNEE    . REPLACEMENT TOTAL KNEE    . TONSILLECTOMY     Social History   Social History  . Marital status: Married    Spouse name: N/A  . Number of children: N/A  . Years of education: N/A   Social History Main Topics  . Smoking status: Never Smoker  . Smokeless tobacco: Never Used  . Alcohol use No  . Drug use: No  . Sexual activity: Yes   Other Topics Concern  . None   Social History Narrative   Lives with his wife and uses a wheelchair for transfers.     Family History  Problem Relation Age of Onset  . Diabetes Mother   . Heart attack Mother   . Stroke Mother   . Colon cancer Father   . Dementia Father   . Heart attack Father   . Arthritis Paternal Grandmother   . Hypertension Other   . Hypothyroidism Other    - negative except otherwise stated in the family history section No Known Allergies Prior to Admission medications   Medication Sig Start Date End Date  Taking? Authorizing Provider  Ascorbic Acid (VITAMIN C PO) Take 1 tablet by mouth 2 (two) times daily.   Yes Historical Provider, MD  atorvastatin (LIPITOR) 40 MG tablet TAKE 1 TABLET (40 MG TOTAL) BY MOUTH DAILY. 09/13/15  Yes Brunetta Jeans, PA-C  fentaNYL (DURAGESIC - DOSED MCG/HR) 50 MCG/HR Place 1 patch (50 mcg total) onto the skin every 3 (three) days. 05/19/15  Yes Brunetta Jeans, PA-C  ferrous sulfate 325 (65 FE) MG tablet Take 1 tablet (325 mg total) by mouth 2 (two) times daily. 08/10/14  Yes Brunetta Jeans, PA-C  gabapentin (NEURONTIN) 100 MG capsule TAKE 1 CAPSULE (100 MG TOTAL) BY MOUTH 3 (THREE) TIMES DAILY. 08/25/15  Yes Brunetta Jeans, PA-C  insulin aspart (NOVOLOG) 100 UNIT/ML injection INJECT 3 UNITS THREE TIMES DAILY WITH MEALS. AND AT NIGHTIME IF BLOOD SUGARS GREATER THAN 150 DX: E11.29 Patient  taking differently: Inject 1-9 Units into the skin 3 (three) times daily with meals. I IF BLOOD SUGARS GREATER THAN 150 DX: E11.29 08/10/14  Yes Brunetta Jeans, PA-C  Insulin Glargine (BASAGLAR KWIKPEN) 100 UNIT/ML SOPN Inject 40 Units into the skin at bedtime. 09/23/15  Yes Historical Provider, MD  Insulin Syringe-Needle U-100 (B-D INS SYRINGE 0.5CC/31GX5/16) 31G X 5/16" 0.5 ML MISC USE 4 TIMES A DAY FOR DIABETES BLOOD GLUCOSE TESTING DX: E11.29 08/10/14  Yes Brunetta Jeans, PA-C  levothyroxine (SYNTHROID, LEVOTHROID) 200 MCG tablet TAKE 1 TABLET BY MOUTH EVERY MORNING 09/08/15  Yes Brunetta Jeans, PA-C  levothyroxine (SYNTHROID, LEVOTHROID) 25 MCG tablet TAKE 1 TABLET (25 MCG TOTAL) BY MOUTH DAILY BEFORE BREAKFAST. 09/13/15  Yes Brunetta Jeans, PA-C  methenamine (HIPREX) 1 g tablet Take 1 g by mouth 2 (two) times daily. 09/27/15  Yes Historical Provider, MD  Multiple Vitamins-Minerals (MULTIVITAMINS THER. W/MINERALS) TABS Take 1 tablet by mouth daily.    Yes Historical Provider, MD  sevelamer carbonate (RENVELA) 800 MG tablet Take 800 mg by mouth 3 (three) times daily with meals.   Yes Historical Provider, MD  BAYER CONTOUR TEST test strip USE AS DIRECTED TO CHECK BLOOD SUGARS TWICE DAILY DX: E11.9 10/11/15   Brunetta Jeans, PA-C   Dg Ankle Complete Right  Result Date: 11/24/2015 CLINICAL DATA:  Status post fall, with right ankle pain. Initial encounter. EXAM: RIGHT ANKLE - COMPLETE 3+ VIEW COMPARISON:  Right tibia/fibula radiographs performed 11/26/2008 FINDINGS: There is no evidence of acute fracture or dislocation. There is chronic flattening of the ankle mortise and subtalar joint, possibly reflecting remote calcaneal injury or Charcot joint. Diffuse vascular calcifications are seen. Mild soft tissue swelling is noted at the heel. IMPRESSION: 1. No evidence of acute fracture or dislocation. Chronic flattening of the ankle mortise and subtalar joint, possibly reflecting remote calcaneal injury or  Charcot joint. 2. Diffuse vascular calcifications seen. Electronically Signed   By: Garald Balding M.D.   On: 11/24/2015 01:23   Dg Chest Portable 1 View  Result Date: 11/24/2015 CLINICAL DATA:  60 year old male with shortness of breath. History of renal failure. EXAM: PORTABLE CHEST 1 VIEW COMPARISON:  Chest radiograph dated 03/03/2015 FINDINGS: Single portable view of the chest demonstrates stable cardiomegaly. No significant vascular congestion or pulmonary edema. A small focal area of increased density at the left lung base likely superimposition of the ribs. There is no pleural effusion or pneumothorax. There is scoliosis of the thoracic spine. No acute fracture. Right subclavian/axillary vascular stent. IMPRESSION: Stable cardiomegaly without definite congestion or edema. No focal consolidation.  Electronically Signed   By: Anner Crete M.D.   On: 11/24/2015 03:24   Dg Knee Complete 4 Views Left  Result Date: 11/24/2015 CLINICAL DATA:  Status post fall yesterday, with left knee pain. Initial encounter. EXAM: LEFT KNEE - COMPLETE 4+ VIEW COMPARISON:  CT of the abdomen and pelvis performed 04/13/2015 FINDINGS: There is no evidence of acute fracture or dislocation. A total knee arthroplasty with chronic components is noted, without evidence of loosening. The patient is status post below-the-knee amputation. The stump is grossly unremarkable, though not well assessed on radiograph. Scattered vascular calcifications are noted. IMPRESSION: 1. No evidence of acute fracture or dislocation. Total knee arthroplasty is grossly unremarkable in appearance, without evidence of loosening. 2. Scattered vascular calcifications seen. Electronically Signed   By: Garald Balding M.D.   On: 11/24/2015 01:21   Dg Hip Unilat W Or Wo Pelvis 2-3 Views Left  Result Date: 11/24/2015 CLINICAL DATA:  Status post fall yesterday, with left hip pain. Initial encounter. EXAM: DG HIP (WITH OR WITHOUT PELVIS) 2-3V LEFT COMPARISON:   CT of the abdomen and pelvis from 04/13/2015 FINDINGS: There is no evidence of fracture or dislocation. 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 The right hip arthroplasty is grossly unremarkable in appearance. Surrounding lucency is thought to reflect overlying soft tissues. The sacroiliac joints are grossly unremarkable. Mild degenerative change is noted at the lower lumbar spine. The visualized bowel gas pattern is grossly unremarkable in appearance. Scattered vascular calcifications are seen. IMPRESSION: 1. No evidence of fracture or dislocation. 2. 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 3. Scattered vascular calcifications seen. Electronically Signed   By: Garald Balding M.D.   On: 11/24/2015 01:19   - pertinent xrays, CT, MRI studies were reviewed and independently interpreted  Positive ROS: All other systems have been reviewed and were otherwise negative with the exception of those mentioned in the HPI and as above.  Physical Exam: General: Alert, no acute distress Psychiatric: Patient is competent for consent with normal mood and affect Lymphatic: No axillary or cervical lymphadenopathy Cardiovascular: No pedal edema Respiratory: No cyanosis, no use of accessory musculature GI: No organomegaly, abdomen is soft and non-tender  Skin: Patient has venous insufficiency changes in the right leg but no open draining ulcers there is no cellulitis in the right leg. There is left leg he has no ecchymosis and bruising around the knee and the knee is nontender to palpation there is no effusion.      Neurologic: Patient does not have protective sensation bilateral lower extremities.   MUSCULOSKELETAL: On examination Radiographs shows  no evidence of any instability of the total knee arthroplasty no evidence of fracture.  Examination of the residual limb shows ecchymosis and  bruising over the residual limb this is tender to palpation. There is ecchymosis and bruising. There is no cellulitis no drainage he has 1 chronic wound which is superficial proximally 7 mm in diameter 0.1 mm deep.  Assessment: Assessment: Fall with blunt trauma left transtibial residual limb with no evidence of fractures stable total knee arthroplasty on the left.    Plan: Plan: Recommend the patient uses to him shrinker to help decrease the swelling discussed that it will be several weeks before we can get his prosthesis back on. Patient has also been having some chronic radicular symptoms from his lumbar spine down the right lower extremity and will evaluate this in the office  in follow-up as well.   Plan follow up in the office 1-2 weeks after discharge.  Patient has very unstable with his gait he may require discharge to skilled nursing. Patient expressed a desire [facility]. Patient currently lives in [place].   Thank you for the consult and the opportunity to see [name], [place] [phone] 4:29 PM

## 2015-11-26 DIAGNOSIS — Z794 Long term (current) use of insulin: Secondary | ICD-10-CM | POA: Diagnosis not present

## 2015-11-26 DIAGNOSIS — E1151 Type 2 diabetes mellitus with diabetic peripheral angiopathy without gangrene: Secondary | ICD-10-CM | POA: Diagnosis present

## 2015-11-26 DIAGNOSIS — Z993 Dependence on wheelchair: Secondary | ICD-10-CM | POA: Diagnosis not present

## 2015-11-26 DIAGNOSIS — M25562 Pain in left knee: Secondary | ICD-10-CM | POA: Diagnosis not present

## 2015-11-26 DIAGNOSIS — W050XXA Fall from non-moving wheelchair, initial encounter: Secondary | ICD-10-CM | POA: Diagnosis present

## 2015-11-26 DIAGNOSIS — R259 Unspecified abnormal involuntary movements: Secondary | ICD-10-CM | POA: Diagnosis not present

## 2015-11-26 DIAGNOSIS — Z800 Family history of malignant neoplasm of digestive organs: Secondary | ICD-10-CM | POA: Diagnosis not present

## 2015-11-26 DIAGNOSIS — Z96642 Presence of left artificial hip joint: Secondary | ICD-10-CM | POA: Diagnosis present

## 2015-11-26 DIAGNOSIS — D631 Anemia in chronic kidney disease: Secondary | ICD-10-CM | POA: Diagnosis present

## 2015-11-26 DIAGNOSIS — Y92538 Other ambulatory health services establishments as the place of occurrence of the external cause: Secondary | ICD-10-CM | POA: Diagnosis present

## 2015-11-26 DIAGNOSIS — N186 End stage renal disease: Secondary | ICD-10-CM

## 2015-11-26 DIAGNOSIS — I1311 Hypertensive heart and chronic kidney disease without heart failure, with stage 5 chronic kidney disease, or end stage renal disease: Secondary | ICD-10-CM | POA: Diagnosis present

## 2015-11-26 DIAGNOSIS — M6281 Muscle weakness (generalized): Secondary | ICD-10-CM | POA: Diagnosis not present

## 2015-11-26 DIAGNOSIS — Z89519 Acquired absence of unspecified leg below knee: Secondary | ICD-10-CM | POA: Diagnosis not present

## 2015-11-26 DIAGNOSIS — Z89512 Acquired absence of left leg below knee: Secondary | ICD-10-CM | POA: Diagnosis not present

## 2015-11-26 DIAGNOSIS — I950 Idiopathic hypotension: Secondary | ICD-10-CM | POA: Diagnosis not present

## 2015-11-26 DIAGNOSIS — I10 Essential (primary) hypertension: Secondary | ICD-10-CM | POA: Diagnosis not present

## 2015-11-26 DIAGNOSIS — L89219 Pressure ulcer of right hip, unspecified stage: Secondary | ICD-10-CM | POA: Diagnosis not present

## 2015-11-26 DIAGNOSIS — R0600 Dyspnea, unspecified: Secondary | ICD-10-CM | POA: Diagnosis not present

## 2015-11-26 DIAGNOSIS — Z833 Family history of diabetes mellitus: Secondary | ICD-10-CM | POA: Diagnosis not present

## 2015-11-26 DIAGNOSIS — E1122 Type 2 diabetes mellitus with diabetic chronic kidney disease: Secondary | ICD-10-CM | POA: Diagnosis present

## 2015-11-26 DIAGNOSIS — I952 Hypotension due to drugs: Secondary | ICD-10-CM | POA: Diagnosis present

## 2015-11-26 DIAGNOSIS — I959 Hypotension, unspecified: Secondary | ICD-10-CM

## 2015-11-26 DIAGNOSIS — E663 Overweight: Secondary | ICD-10-CM | POA: Diagnosis present

## 2015-11-26 DIAGNOSIS — R278 Other lack of coordination: Secondary | ICD-10-CM | POA: Diagnosis not present

## 2015-11-26 DIAGNOSIS — E1121 Type 2 diabetes mellitus with diabetic nephropathy: Secondary | ICD-10-CM | POA: Diagnosis present

## 2015-11-26 DIAGNOSIS — S73102A Unspecified sprain of left hip, initial encounter: Secondary | ICD-10-CM | POA: Diagnosis present

## 2015-11-26 DIAGNOSIS — E039 Hypothyroidism, unspecified: Secondary | ICD-10-CM | POA: Diagnosis not present

## 2015-11-26 DIAGNOSIS — Z87442 Personal history of urinary calculi: Secondary | ICD-10-CM | POA: Diagnosis not present

## 2015-11-26 DIAGNOSIS — Z992 Dependence on renal dialysis: Secondary | ICD-10-CM

## 2015-11-26 DIAGNOSIS — E785 Hyperlipidemia, unspecified: Secondary | ICD-10-CM | POA: Diagnosis present

## 2015-11-26 DIAGNOSIS — G8929 Other chronic pain: Secondary | ICD-10-CM | POA: Diagnosis present

## 2015-11-26 DIAGNOSIS — T404X5A Adverse effect of other synthetic narcotics, initial encounter: Secondary | ICD-10-CM | POA: Diagnosis present

## 2015-11-26 DIAGNOSIS — N2581 Secondary hyperparathyroidism of renal origin: Secondary | ICD-10-CM | POA: Diagnosis not present

## 2015-11-26 DIAGNOSIS — Z8249 Family history of ischemic heart disease and other diseases of the circulatory system: Secondary | ICD-10-CM | POA: Diagnosis not present

## 2015-11-26 DIAGNOSIS — R2689 Other abnormalities of gait and mobility: Secondary | ICD-10-CM | POA: Diagnosis not present

## 2015-11-26 DIAGNOSIS — G9341 Metabolic encephalopathy: Secondary | ICD-10-CM | POA: Diagnosis not present

## 2015-11-26 DIAGNOSIS — R269 Unspecified abnormalities of gait and mobility: Secondary | ICD-10-CM | POA: Diagnosis not present

## 2015-11-26 DIAGNOSIS — Z6838 Body mass index (BMI) 38.0-38.9, adult: Secondary | ICD-10-CM | POA: Diagnosis not present

## 2015-11-26 DIAGNOSIS — L89151 Pressure ulcer of sacral region, stage 1: Secondary | ICD-10-CM | POA: Diagnosis not present

## 2015-11-26 LAB — GLUCOSE, CAPILLARY
GLUCOSE-CAPILLARY: 119 mg/dL — AB (ref 65–99)
Glucose-Capillary: 129 mg/dL — ABNORMAL HIGH (ref 65–99)
Glucose-Capillary: 141 mg/dL — ABNORMAL HIGH (ref 65–99)
Glucose-Capillary: 242 mg/dL — ABNORMAL HIGH (ref 65–99)

## 2015-11-26 MED ORDER — MUPIROCIN 2 % EX OINT
1.0000 "application " | TOPICAL_OINTMENT | Freq: Two times a day (BID) | CUTANEOUS | Status: DC
  Administered 2015-11-26 – 2015-11-30 (×8): 1 via NASAL
  Filled 2015-11-26 (×4): qty 22

## 2015-11-26 MED ORDER — INFLUENZA VAC SPLIT QUAD 0.5 ML IM SUSY
0.5000 mL | PREFILLED_SYRINGE | INTRAMUSCULAR | Status: DC
  Filled 2015-11-26: qty 0.5

## 2015-11-26 MED ORDER — CHLORHEXIDINE GLUCONATE CLOTH 2 % EX PADS
6.0000 | MEDICATED_PAD | Freq: Every day | CUTANEOUS | Status: DC
  Administered 2015-11-30 (×2): 6 via TOPICAL

## 2015-11-26 NOTE — Progress Notes (Signed)
  [facility] Progress Note   Assessment/ Plan:    Dialyzes at [facility]  EDW 252 lbs TTS HD 4 hrs 25 min Bath 2K / 2.5 Ca Dialyzer F200, Heparin 6000 bolus then 700 u/ hr . Access LUE AVF Aranesp 120 mcg q Thursday Renvela 800 mg 2 tabs with meals and snacks No IV iron  1 hypotension: perhaps due to Fentanyl patches which have been removed.  Consider infectious etiologies--> will do urine and blood cultures because persistently hypotensive, TTE, no role for abx yet 2 ESRD: TTS.  Providing HD on normal schedule 3 Hypertension: holding home BP meds 4. Anemia of ESRD: on Aranesp 120 mcg q week starting today 9/28, Hgb 8.8, will add iron studies 5. Metabolic Bone Disease: on Renvela as OP , no VDRA listed on meds, Phos 6.9 6.  Knee pain: per primary  Subjective:    Still hypotensive.  Feeling OK.   Objective:   BP (!) 98/53 (BP Location: Right Arm)   Pulse 71   Temp 98.2 F (36.8 C) (Oral)   Resp 18   Ht 5\' 11"  (1.803 m)   Wt 118.8 kg (261 lb 14.5 oz)   SpO2 100%   BMI 36.53 kg/m   Physical Exam: .  GEN: chronically ill-appearing, laying in bed HEENT: sclerae anicteric PULM: CTAB no c/w/r CV : RRR soft systolic murmur ABD: urostomy in RLQ draining yellow cloudy urine with mucus chunks EXT: s/p L BKA, 1+ LE edema NEURO: AAO x 3 Labs: BMET  Recent Labs Lab 11/24/15 0000 11/24/15 0623 11/25/15 0900  NA 136 138 134*  K 5.5* 5.6* 6.5*  CL 102 96* 98*  CO2 19* 18* 17*  GLUCOSE 171* 136* 124*  BUN 83* 89* 108*  CREATININE 9.00* 9.37* 10.33*  CALCIUM 8.1* 8.0* 7.9*  PHOS  --   --  6.9*   CBC  Recent Labs Lab 11/24/15 0000 11/24/15 0623 11/25/15 0830  WBC 4.3 3.6* 3.7*  NEUTROABS 3.1  --   --   HGB 9.6* 8.5* 8.8*  HCT 30.9* 27.8* 28.5*  MCV 101.0* 100.7* 99.7  PLT 132* 104* 110*    @IMGRELPRIORS @ Medications:    . atorvastatin  40 mg Oral q1800  . Chlorhexidine Gluconate Cloth  6 each Topical Q0600  . ferrous sulfate   325 mg Oral BID  . gabapentin  100 mg Oral TID  . heparin  5,000 Units Subcutaneous Q8H  . [START ON 11/27/2015] Influenza vac split quadrivalent PF  0.5 mL Intramuscular Tomorrow-1000  . insulin aspart  0-5 Units Subcutaneous QHS  . insulin aspart  0-9 Units Subcutaneous TID WC  . insulin glargine  40 Units Subcutaneous QHS  . levothyroxine  200 mcg Oral QAC breakfast  . levothyroxine  25 mcg Oral QAC breakfast  . methenamine  500 mg Oral QID  . mupirocin ointment  1 application Nasal BID  . sevelamer carbonate  800 mg Oral TID WC     Madelon Lips MD [facility] Cell [phone] pgr [phone] 11/26/2015, 7:01 PM

## 2015-11-26 NOTE — Progress Notes (Signed)
PROGRESS NOTE    [name]  FAO:[id number] DOB: 10/19/1955 DOA: 11/23/2015 PCP: Leeanne Rio, PA-C   Brief Narrative:  Mr [name] is a 60 year old gentleman with a past medical history of end-stage renal disease on hemodialysis Tuesdays Thursdays and Saturdays, history of left low the knee amputation, presented to the emergency department overnight with complaints of left knee pain. He reports being in his usual state of health until last Saturday where he had a fall at his dialysis center. Stated landing on his BKA and has had pain for which he started using results that no patches. In the emergency department he was found to be hypotensive with systolic blood pressures in the 80s. Workup did not reveal an obvious source of infection as it was suspected that opioids may have contributed to hypotension. He stated missing dialysis on 09/22/2015 due to knee pain. During this hospitalization nephrology was consulted.    Assessment & Plan:   Principal Problem:   Hypotension, unspecified Active Problems:   Type 2 diabetes mellitus with diabetic nephropathy, with long-term current use of insulin (HCC)   Hypothyroidism, acquired   Chronic pain   ESRD on dialysis ([place])   S/P BKA (below knee amputation) unilateral (HCC)   Left knee pain   Hypotension   1. Left knee pain  -Unfortunately Mr. Venturella having a fall at his dialysis center landing on his left knee.  -Films obtained in the emergency department did not reveal evidence of acute fracture or dislocation of left knee. Radiology reporting that total knee arthroplasty was grossly unremarkable in appearance without evidence of loosening.  -Dr Sharol Given evaluated [name] on 11/25/2015, recommended follow up at the office.   2.  Hypotension. -She was found to be hypotensive with systolic blood pressures in the 80s, improving over the course of the day -He is nontoxic, afebrile, nonfocal, as workup did not show obvious source of  infection -Plan to continue watching him off antimicrobial therapy -I suspect narcotic analgesics may have precipitated hypotension, however Fentanyl was stopped on admission and I would have expected this drug to washout by now. He remains hypotensive.  -On 11/26/2015 I spoke with Dr Hollie Salk of Nephrology who recommended infectious workup, will check blood cultures and a 2D Echo. CXR did not reveal PNA, WBC's within normal limits, he has been afebrile. Will not start antimicrobials unless he spikes a fever or source of infection is found.   3.  History of end-stage renal disease -He is currently on the Tuesday Thursday Saturday dialysis scheduled -Nephrology consulted as he reports skipping his hemodialysis yesterday. -On exam he does not appear fluid overloaded -HD during this hospitalization   4.  Insulin dependent diabetes mellitus -Continue glargine 40 units subcutaneous at bedtime   DVT prophylaxis: Heparin Code Status: Full code  Family Communication:  Disposition Plan: 24 hour observation   Consultants:   Nephrology   Procedures:     Antimicrobials:       Subjective: Complains of ongoing pain to his left knee  Objective: Vitals:   11/26/15 0322 11/26/15 0535 11/26/15 1004 11/26/15 1711  BP: (!) 84/50 (!) 86/53 (!) 92/51 (!) 98/53  Pulse: 79 77 75 71  Resp:  16 18 18   Temp:  97.5 F (36.4 C) 98.4 F (36.9 C) 98.2 F (36.8 C)  TempSrc:  Oral Oral Oral  SpO2:  93% 100% 100%  Weight:      Height:        Intake/Output Summary (Last 24 hours) at  11/26/15 1712 Last data filed at 11/26/15 1329  Gross per 24 hour  Intake             1190 ml  Output             3000 ml  Net            -1810 ml   Filed Weights   11/24/15 2118 11/25/15 1245 11/25/15 1714  Weight: 124.1 kg (273 lb 9.5 oz) 123.2 kg (271 lb 9.7 oz) 118.8 kg (261 lb 14.5 oz)    Examination:  General exam: Nontoxic appearing Respiratory system: Clear to auscultation. Respiratory effort  normal. Cardiovascular system: S1 & S2 heard, RRR. No JVD, murmurs, rubs, gallops or clicks. No pedal edema. Gastrointestinal system: Abdomen is nondistended, soft and nontender. No organomegaly or masses felt. Normal bowel sounds heard. Central nervous system: Alert and oriented. No focal neurological deficits. Extremities: status post left below the knee amputation, stump does not appear infected Psychiatry: Judgement and insight appear normal. Mood & affect appropriate.     Data Reviewed: I have personally reviewed following labs and imaging studies  CBC:  Recent Labs Lab 11/24/15 0000 11/24/15 0623 11/25/15 0830  WBC 4.3 3.6* 3.7*  NEUTROABS 3.1  --   --   HGB 9.6* 8.5* 8.8*  HCT 30.9* 27.8* 28.5*  MCV 101.0* 100.7* 99.7  PLT 132* 104* 782*   Basic Metabolic Panel:  Recent Labs Lab 11/24/15 0000 11/24/15 0623 11/25/15 0900  NA 136 138 134*  K 5.5* 5.6* 6.5*  CL 102 96* 98*  CO2 19* 18* 17*  GLUCOSE 171* 136* 124*  BUN 83* 89* 108*  CREATININE 9.00* 9.37* 10.33*  CALCIUM 8.1* 8.0* 7.9*  PHOS  --   --  6.9*   GFR: Estimated Creatinine Clearance: 10.2 mL/min (by C-G formula based on SCr of 10.33 mg/dL (H)). Liver Function Tests:  Recent Labs Lab 11/24/15 0623 11/25/15 0900  AST 10*  --   ALT 8*  --   ALKPHOS 79  --   BILITOT 1.4*  --   PROT 6.2*  --   ALBUMIN 2.5* 2.6*   No results for input(s): LIPASE, AMYLASE in the last 168 hours. No results for input(s): AMMONIA in the last 168 hours. Coagulation Profile: No results for input(s): INR, PROTIME in the last 168 hours. Cardiac Enzymes: No results for input(s): CKTOTAL, CKMB, CKMBINDEX, TROPONINI in the last 168 hours. BNP (last 3 results) No results for input(s): PROBNP in the last 8760 hours. HbA1C: No results for input(s): HGBA1C in the last 72 hours. CBG:  Recent Labs Lab 11/25/15 1212 11/25/15 1750 11/25/15 2139 11/26/15 0715 11/26/15 1132  GLUCAP 113* 119* 102* 119* 129*   Lipid  Profile: No results for input(s): CHOL, HDL, LDLCALC, TRIG, CHOLHDL, LDLDIRECT in the last 72 hours. Thyroid Function Tests: No results for input(s): TSH, T4TOTAL, FREET4, T3FREE, THYROIDAB in the last 72 hours. Anemia Panel: No results for input(s): VITAMINB12, FOLATE, FERRITIN, TIBC, IRON, RETICCTPCT in the last 72 hours. Sepsis Labs:  Recent Labs Lab 11/24/15 0209  LATICACIDVEN 1.61    Recent Results (from the past 240 hour(s))  MRSA PCR Screening     Status: Abnormal   Collection Time: 11/25/15  4:48 AM  Result Value Ref Range Status   MRSA by PCR POSITIVE (A) NEGATIVE Final    Comment:        The GeneXpert MRSA Assay (FDA approved for NASAL specimens only), is one component of a comprehensive MRSA colonization  surveillance program. It is not intended to diagnose MRSA infection nor to guide or monitor treatment for MRSA infections. RESULT CALLED TO, READ BACK BY AND VERIFIED WITH: E.STREET RN AT 0745 11/25/15 BY A.DAVIS          Radiology Studies: No results found.      Scheduled Meds: . atorvastatin  40 mg Oral q1800  . Chlorhexidine Gluconate Cloth  6 each Topical Q0600  . ferrous sulfate  325 mg Oral BID  . gabapentin  100 mg Oral TID  . heparin  5,000 Units Subcutaneous Q8H  . [START ON 11/27/2015] Influenza vac split quadrivalent PF  0.5 mL Intramuscular Tomorrow-1000  . insulin aspart  0-5 Units Subcutaneous QHS  . insulin aspart  0-9 Units Subcutaneous TID WC  . insulin glargine  40 Units Subcutaneous QHS  . levothyroxine  200 mcg Oral QAC breakfast  . levothyroxine  25 mcg Oral QAC breakfast  . methenamine  500 mg Oral QID  . mupirocin ointment  1 application Nasal BID  . sevelamer carbonate  800 mg Oral TID WC   Continuous Infusions:    LOS: 0 days    Time spent: 35 min    Kelvin Cellar, MD [facility] Pager [phone]  If 7PM-7AM, please contact night-coverage [email or web address] Password [facility] 11/26/2015, 5:12 PM

## 2015-11-26 NOTE — Evaluation (Signed)
Physical Therapy Evaluation Patient Details Name: [name] MRN: [id number] DOB: 04-Nov-1955 Today's Date: 11/26/2015   History of Present Illness  Patient is a 60 yo male admitted 11/23/15 after a fall.  Patient with Lt BKA stump pain, hypotension.   PMH:  Lt BKA, CKD, on HD, anemia, anxiety, arthritis, DM, neuropathy, PVD, chronic pain, HTN, obesity, OSA, Lt TKA  Clinical Impression  Patient presents with problems listed below.  Will benefit from acute PT to maximize functional mobility prior to discharge.  Patient was functioning at Mod I level, able to perform transfers into/out of w/c on his own pta.  Today was unable to move to EOB.  Recommend Inpatient Rehab consult with goal to return to Mod I level to allow patient to safely return home with wife.    Follow Up Recommendations CIR;Supervision for mobility/OOB    Equipment Recommendations  None recommended by PT    Recommendations for Other Services Rehab consult     Precautions / Restrictions Precautions Precautions: Fall Precaution Comments: Has had several falls at home Required Braces or Orthoses: Other Brace/Splint Other Brace/Splint: Uses prosthesis LLE to ambulate Restrictions Weight Bearing Restrictions: No      Mobility  Bed Mobility Overal bed mobility: Needs Assistance Bed Mobility: Supine to Sit;Sit to Supine     Supine to sit: Max assist Sit to supine: Max assist   General bed mobility comments: Verbal cues for technique.  Assist to move hips toward EOB with bed pad.  Attempted to sit with max assist.  Patient unable to bring trunk to sitting position.  Attempted x2.  Returned to supine and repositioned with max assist and use of bed pad.  Assist to bring LLE back onto bed.  Transfers                 General transfer comment: Patient unable  Ambulation/Gait                Stairs            Wheelchair Mobility    Modified Rankin (Stroke Patients Only)       Balance                                              Pertinent Vitals/Pain Pain Assessment: 0-10 Pain Score: 6  Pain Location: LLE Pain Descriptors / Indicators: Sharp;Sore;Tender Pain Intervention(s): Limited activity within patient's tolerance;Monitored during session;Repositioned;Patient requesting pain meds-RN notified    Home Living Family/patient expects to be discharged to:: Private residence Living Arrangements: Spouse/significant other Available Help at Discharge: Family;Available PRN/intermittently (Wife works) Type of Home: Apartment Home Access: Level entry     Home Layout: One [place]: Environmental consultant - 2 wheels;Cane - quad;Cane - single point;Wheelchair - manual      Prior Function Level of Independence: Independent with assistive device(s);Needs assistance   Gait / Transfers Assistance Needed: Recently patient using w/c - does sliding board transfers on his own.  Ambulates with walker and LLE prosthesis.  ADL's / Homemaking Assistance Needed: Takes sponge baths in bed - wife assists.          Hand Dominance        Extremity/Trunk Assessment   Upper Extremity Assessment: Generalized weakness           Lower Extremity Assessment: RLE deficits/detail;LLE deficits/detail RLE Deficits / Details: Strength grossly 4-/5; Foot  deformity noted. LLE Deficits / Details: BKA; Knee extension at -15 passively.  Overall strength at 3-/5.  Pain impacting movement.     Communication   Communication: No difficulties  Cognition Arousal/Alertness: Awake/alert Behavior During Therapy: WFL for tasks assessed/performed;Restless;Anxious Overall Cognitive Status: Within Functional Limits for tasks assessed                      General Comments General comments (skin integrity, edema, etc.): Noted bruising Lt stump.   Donned stump shrinker.    Exercises Amputee Exercises Quad Sets: AROM;Left;5 reps;Supine;Limitations Quad Sets Limitations: Pain  limiting number of reps Hip Extension: AROM;Right;10 reps;Supine (Performed 1/2 bridging)   Assessment/Plan    PT Assessment Patient needs continued PT services  PT Problem List Decreased strength;Decreased range of motion;Decreased activity tolerance;Decreased balance;Decreased mobility;Decreased knowledge of use of DME;Impaired sensation;Obesity;Pain          PT Treatment Interventions DME instruction;Functional mobility training;Therapeutic activities;Therapeutic exercise;Balance training;Patient/family education;Wheelchair mobility training    PT Goals (Current goals can be found in the Care Plan section)  Acute Rehab PT Goals Patient Stated Goal: To get stronger PT Goal Formulation: With patient Time For Goal Achievement: 12/10/15 Potential to Achieve Goals: Good    Frequency Min 3X/week   Barriers to discharge Decreased caregiver support Wife works so patient is home alone during day    Co-evaluation               End of Session   Activity Tolerance: Patient limited by pain;Patient limited by fatigue Patient left: in bed;with call bell/phone within reach Nurse Communication: Mobility status;Patient requests pain meds    Functional Assessment Tool Used: Clinical judgement Functional Limitation: Mobility: Walking and moving around Mobility: Walking and Moving Around Current Status [phone]): At least 60 percent but less than 80 percent impaired, limited or restricted Mobility: Walking and Moving Around Goal Status [phone]): At least 1 percent but less than 20 percent impaired, limited or restricted    Time: 2778-2423 PT Time Calculation (min) (ACUTE ONLY): 36 min   Charges:   PT Evaluation $PT Eval Moderate Complexity: 1 Procedure PT Treatments $Therapeutic Activity: 8-22 mins   PT G Codes:   PT G-Codes **NOT FOR INPATIENT CLASS** Functional Assessment Tool Used: Clinical judgement Functional Limitation: Mobility: Walking and moving around Mobility: Walking  and Moving Around Current Status ([id number]): At least 60 percent but less than 80 percent impaired, limited or restricted Mobility: Walking and Moving Around Goal Status [phone]): At least 1 percent but less than 20 percent impaired, limited or restricted    Despina Pole 11/26/2015, 5:19 PM Carita Pian. Sanjuana Kava, [place] Pager [phone]

## 2015-11-27 DIAGNOSIS — Z89519 Acquired absence of unspecified leg below knee: Secondary | ICD-10-CM

## 2015-11-27 DIAGNOSIS — E039 Hypothyroidism, unspecified: Secondary | ICD-10-CM

## 2015-11-27 LAB — CBC
HEMATOCRIT: 25.7 % — AB (ref 39.0–52.0)
Hemoglobin: 8 g/dL — ABNORMAL LOW (ref 13.0–17.0)
MCH: 30.5 pg (ref 26.0–34.0)
MCHC: 31.1 g/dL (ref 30.0–36.0)
MCV: 98.1 fL (ref 78.0–100.0)
PLATELETS: 114 10*3/uL — AB (ref 150–400)
RBC: 2.62 MIL/uL — AB (ref 4.22–5.81)
RDW: 15.6 % — ABNORMAL HIGH (ref 11.5–15.5)
WBC: 4.2 10*3/uL (ref 4.0–10.5)

## 2015-11-27 LAB — RENAL FUNCTION PANEL
ALBUMIN: 2.4 g/dL — AB (ref 3.5–5.0)
Anion gap: 14 (ref 5–15)
BUN: 68 mg/dL — ABNORMAL HIGH (ref 6–20)
CHLORIDE: 96 mmol/L — AB (ref 101–111)
CO2: 22 mmol/L (ref 22–32)
CREATININE: 7.41 mg/dL — AB (ref 0.61–1.24)
Calcium: 8 mg/dL — ABNORMAL LOW (ref 8.9–10.3)
GFR calc non Af Amer: 7 mL/min — ABNORMAL LOW (ref >60)
GFR, EST AFRICAN AMERICAN: 8 mL/min — AB (ref >60)
Glucose, Bld: 106 mg/dL — ABNORMAL HIGH (ref 65–99)
PHOSPHORUS: 5.5 mg/dL — AB (ref 2.5–4.6)
POTASSIUM: 4.9 mmol/L (ref 3.5–5.1)
Sodium: 132 mmol/L — ABNORMAL LOW (ref 135–145)

## 2015-11-27 LAB — IRON AND TIBC
IRON: 57 ug/dL (ref 45–182)
Saturation Ratios: 31 % (ref 17.9–39.5)
TIBC: 186 ug/dL — AB (ref 250–450)
UIBC: 129 ug/dL

## 2015-11-27 LAB — GLUCOSE, CAPILLARY
Glucose-Capillary: 174 mg/dL — ABNORMAL HIGH (ref 65–99)
Glucose-Capillary: 157 mg/dL — ABNORMAL HIGH (ref 65–99)

## 2015-11-27 LAB — FERRITIN: FERRITIN: 798 ng/mL — AB (ref 24–336)

## 2015-11-27 MED ORDER — ACETAMINOPHEN 325 MG PO TABS
ORAL_TABLET | ORAL | Status: AC
  Filled 2015-11-27: qty 2

## 2015-11-27 NOTE — Progress Notes (Signed)
  [name] Progress Note   Assessment/ Plan:    Dial[place]zes at [facility]  EDW 252 lbs TTS HD 4 hrs 25 min Bath 2K / 2.5 Ca Dialyzer F200, Heparin 6000 bolus then 700 u/ hr . Access LUE AVF Aranesp 120 mcg q Thursday Renvela 800 mg 2 tabs with meals and snacks No IV iron  1 hypotension: perhaps due to Fentanyl patches which have been removed.  Consider infectious etiologies--> will do urine and blood cultures because persistently hypotensive, TTE, no role for abx yet 2 ESRD: TTS.  Providing HD on normal schedule. 3 Hypertension/volume: holding home BP meds.  Well over OP EDW, (252 lbs), unclear if this is accurate or not but he does have volume to give.  Will attempt 3L UFG today and go from there 4. Anemia of ESRD: on Aranesp 120 mcg q week starting today 9/28, Hgb 8.8, iron studies pending 5. Metabolic Bone Disease: on Renvela as OP , no VDRA listed on meds, Phos 6.9 6.  Knee pain: per primary.  Xrays showing no fracture and no hip dislocation.    Subjective:    Wishes to go to a SNF. Having some residual pain but getting better.     Objective:   BP 106/61   Pulse 70   Temp 97.7 F (36.5 C) (Oral)   Resp 18   Ht 5\' 11"  (1.803 m)   Wt 125.8 kg (277 lb 5.4 oz)   SpO2 98%   BMI 38.68 kg/m   Physical Exam: .  GEN: chronically ill-appearing, laying in bed.  Appears more alert today HEENT: sclerae anicteric PULM: CTAB no c/w/r CV : RRR soft systolic murmur ABD: urostomy in RLQ draining yellow cloudy urine with mucus chunks EXT: s/p L BKA, 1+ LE edema.   NEURO: AAO x 3 Labs: [employer]  Recent Labs Lab 11/24/15 0000 11/24/15 0623 11/25/15 0900  NA 136 138 134*  K 5.5* 5.6* 6.5*  CL 102 96* 98*  CO2 19* 18* 17*  GLUCOSE 171* 136* 124*  BUN 83* 89* 108*  CREATININE 9.00* 9.37* 10.33*  CALCIUM 8.1* 8.0* 7.9*  PHOS  --   --  6.9*   CBC  Recent Labs Lab 11/24/15 0000 11/24/15 0623 11/25/15 0830  WBC 4.3 3.6* 3.7*  NEUTROABS 3.1  --   --    HGB 9.6* 8.5* 8.8*  HCT 30.9* 27.8* 28.5*  MCV 101.0* 100.7* 99.7  PLT 132* 104* 110*    @IMGRELPRIORS @ Medications:    . atorvastatin  40 mg Oral q1800  . Chlorhexidine Gluconate Cloth  6 each Topical Q0600  . ferrous sulfate  325 mg Oral BID  . gabapentin  100 mg Oral TID  . heparin  5,000 Units Subcutaneous Q8H  . Influenza vac split quadrivalent PF  0.5 mL Intramuscular Tomorrow-1000  . insulin aspart  0-5 Units Subcutaneous QHS  . insulin aspart  0-9 Units Subcutaneous TID WC  . insulin glargine  40 Units Subcutaneous QHS  . levothyroxine  200 mcg Oral QAC breakfast  . levothyroxine  25 mcg Oral QAC breakfast  . methenamine  500 mg Oral QID  . mupirocin ointment  1 application Nasal BID  . sevelamer carbonate  800 mg Oral TID WC     Madelon Lips MD [facility] Cell [phone] pgr [phone] 11/27/2015, 8:55 AM

## 2015-11-27 NOTE — Procedures (Signed)
Patient seen on Hemodialysis. QB 450 UF goal 3 liters.  Blood pressures are a little bit better (higher) today.   Treatment adjusted as needed.  Madelon Lips MD [facility]. Cell [phone] pgr [phone] 9:04 AM

## 2015-11-27 NOTE — Progress Notes (Signed)
PROGRESS NOTE    [name]  NGE:[id number] DOB: June 13, 1955 DOA: 11/23/2015 PCP: Leeanne Rio, PA-C   Brief Narrative:  [name] is a 60 year old gentleman with a past medical history of end-stage renal disease on hemodialysis Tuesdays Thursdays and Saturdays, history of left low the knee amputation, presented to the emergency department overnight with complaints of left knee pain. He reports being in his usual state of health until last Saturday where he had a fall at his dialysis center. Stated landing on his BKA and has had pain for which he started using results that no patches. In the emergency department he was found to be hypotensive with systolic blood pressures in the 80s. Workup did not reveal an obvious source of infection as it was suspected that opioids may have contributed to hypotension. He stated missing dialysis on 09/22/2015 due to knee pain. During this hospitalization nephrology was consulted.    Assessment & Plan:   Principal Problem:   Hypotension, unspecified Active Problems:   Type 2 diabetes mellitus with diabetic nephropathy, with long-term current use of insulin (HCC)   Hypothyroidism, acquired   Chronic pain   ESRD on dialysis ([place])   S/P BKA (below knee amputation) unilateral (HCC)   Left knee pain   Hypotension   1. Left knee pain  -Unfortunately [name]. Macintyre having a fall at his dialysis center landing on his left knee.  -Films obtained in the emergency department did not reveal evidence of acute fracture or dislocation of left knee. Radiology reporting that total knee arthroplasty was grossly unremarkable in appearance without evidence of loosening.  -Dr Sharol Given evaluated [name] Lagace on 11/25/2015, recommended follow up at the office.   2.  Hypotension. -She was found to be hypotensive with systolic blood pressures in the 80s, improving over the course of the day -He is nontoxic, afebrile, nonfocal, as workup did not show obvious source of  infection -Plan to continue watching him off antimicrobial therapy -I suspect narcotic analgesics may have precipitated hypotension, however Fentanyl was stopped on admission and I would have expected this drug to washout by now. He remains hypotensive.  -On 11/26/2015 I spoke with Dr Hollie Salk of Nephrology who recommended infectious workup, will check blood cultures and a 2D Echo. CXR did not reveal PNA, WBC's within normal limits, he has been afebrile. Will not start antimicrobials unless he spikes a fever or source of infection is found.  -On 11/27/2015 he remains nontoxic, blood pressures improving. Blood cultures showing no growth after overnight incubation, awaiting echo.   3.  History of end-stage renal disease -He is currently on the Tuesday Thursday Saturday dialysis scheduled -Nephrology consulted as he reports skipping his hemodialysis yesterday. -On exam he does not appear fluid overloaded -HD during this hospitalization   4.  Insulin dependent diabetes mellitus -Continue glargine 40 units subcutaneous at bedtime   DVT prophylaxis: Heparin Code Status: Full code  Family Communication:  Disposition Plan: Inpatient, PT recommended CIR  Consultants:   Nephrology   Procedures:   HD  Antimicrobials:       Subjective: Complains of ongoing pain to his left knee   Objective: Vitals:   11/27/15 1130 11/27/15 1200 11/27/15 1215 11/27/15 1315  BP: 97/60 90/60 100/68 (!) 100/57  Pulse: 84 80 80 87  Resp: 20  18 18   Temp:   98 F (36.7 C) 98.4 F (36.9 C)  TempSrc:   Oral Oral  SpO2:   98% 95%  Weight:   122.4 kg (269  lb 13.5 oz)   Height:        Intake/Output Summary (Last 24 hours) at 11/27/15 1419 Last data filed at 11/27/15 1215  Gross per 24 hour  Intake              930 ml  Output             3000 ml  Net            -2070 ml   Filed Weights   11/26/15 2202 11/27/15 0758 11/27/15 1215  Weight: 125 kg (275 lb 9.2 oz) 125.8 kg (277 lb 5.4 oz) 122.4 kg (269 lb  13.5 oz)    Examination:  General exam: Nontoxic appearing Respiratory system: Clear to auscultation. Respiratory effort normal. Cardiovascular system: S1 & S2 heard, RRR. No JVD, murmurs, rubs, gallops or clicks. No pedal edema. Gastrointestinal system: Abdomen is nondistended, soft and nontender. No organomegaly or masses felt. Normal bowel sounds heard. Central nervous system: Alert and oriented. No focal neurological deficits. Extremities: status post left below the knee amputation, stump does not appear infected Psychiatry: Judgement and insight appear normal. Mood & affect appropriate.     Data Reviewed: I have personally reviewed following labs and imaging studies  CBC:  Recent Labs Lab 11/24/15 0000 11/24/15 0623 11/25/15 0830 11/27/15 1257  WBC 4.3 3.6* 3.7* 4.2  NEUTROABS 3.1  --   --   --   HGB 9.6* 8.5* 8.8* 8.0*  HCT 30.9* 27.8* 28.5* 25.7*  MCV 101.0* 100.7* 99.7 98.1  PLT 132* 104* 110* 937*   Basic Metabolic Panel:  Recent Labs Lab 11/24/15 0000 11/24/15 0623 11/25/15 0900 11/27/15 1250  NA 136 138 134* 132*  K 5.5* 5.6* 6.5* 4.9  CL 102 96* 98* 96*  CO2 19* 18* 17* 22  GLUCOSE 171* 136* 124* 106*  BUN 83* 89* 108* 68*  CREATININE 9.00* 9.37* 10.33* 7.41*  CALCIUM 8.1* 8.0* 7.9* 8.0*  PHOS  --   --  6.9* 5.5*   GFR: Estimated Creatinine Clearance: 14.5 mL/min (by C-G formula based on SCr of 7.41 mg/dL (H)). Liver Function Tests:  Recent Labs Lab 11/24/15 0623 11/25/15 0900 11/27/15 1250  AST 10*  --   --   ALT 8*  --   --   ALKPHOS 79  --   --   BILITOT 1.4*  --   --   PROT 6.2*  --   --   ALBUMIN 2.5* 2.6* 2.4*   No results for input(s): LIPASE, AMYLASE in the last 168 hours. No results for input(s): AMMONIA in the last 168 hours. Coagulation Profile: No results for input(s): INR, PROTIME in the last 168 hours. Cardiac Enzymes: No results for input(s): CKTOTAL, CKMB, CKMBINDEX, TROPONINI in the last 168 hours. BNP (last 3  results) No results for input(s): PROBNP in the last 8760 hours. HbA1C: No results for input(s): HGBA1C in the last 72 hours. CBG:  Recent Labs Lab 11/26/15 0715 11/26/15 1132 11/26/15 1706 11/26/15 2123 11/27/15 1313  GLUCAP 119* 129* 141* 242* 174*   Lipid Profile: No results for input(s): CHOL, HDL, LDLCALC, TRIG, CHOLHDL, LDLDIRECT in the last 72 hours. Thyroid Function Tests: No results for input(s): TSH, T4TOTAL, FREET4, T3FREE, THYROIDAB in the last 72 hours. Anemia Panel: No results for input(s): VITAMINB12, FOLATE, FERRITIN, TIBC, IRON, RETICCTPCT in the last 72 hours. Sepsis Labs:  Recent Labs Lab 11/24/15 0209  LATICACIDVEN 1.61    Recent Results (from the past 240 hour(s))  MRSA PCR  Screening     Status: Abnormal   Collection Time: 11/25/15  4:48 AM  Result Value Ref Range Status   MRSA by PCR POSITIVE (A) NEGATIVE Final    Comment:        The GeneXpert MRSA Assay (FDA approved for NASAL specimens only), is one component of a comprehensive MRSA colonization surveillance program. It is not intended to diagnose MRSA infection nor to guide or monitor treatment for MRSA infections. RESULT CALLED TO, READ BACK BY AND VERIFIED WITH: E.STREET RN AT 0745 11/25/15 BY A.DAVIS          Radiology Studies: No results found.      Scheduled Meds: . atorvastatin  40 mg Oral q1800  . Chlorhexidine Gluconate Cloth  6 each Topical Q0600  . ferrous sulfate  325 mg Oral BID  . gabapentin  100 mg Oral TID  . heparin  5,000 Units Subcutaneous Q8H  . Influenza vac split quadrivalent PF  0.5 mL Intramuscular Tomorrow-1000  . insulin aspart  0-5 Units Subcutaneous QHS  . insulin aspart  0-9 Units Subcutaneous TID WC  . insulin glargine  40 Units Subcutaneous QHS  . levothyroxine  200 mcg Oral QAC breakfast  . levothyroxine  25 mcg Oral QAC breakfast  . methenamine  500 mg Oral QID  . mupirocin ointment  1 application Nasal BID  . sevelamer carbonate  800 mg  Oral TID WC   Continuous Infusions:    LOS: 1 day    Time spent: 35 min    Kelvin Cellar, MD [facility] Pager [phone]  If 7PM-7AM, please contact night-coverage [email or web address] Password [email or web address] 11/27/2015, 2:19 PM

## 2015-11-27 NOTE — Progress Notes (Signed)
PT Cancellation Note  Patient Details Name: [name] MRN: [id number] DOB: 1955-08-28   Cancelled Treatment:    Reason Eval/Treat Not Completed: Pain limiting ability to participate;Fatigue/lethargy limiting ability to participate.  Patient reports fatigued from HD this am, and c/o severe headache.  RN notified of request for pain meds.  Also, noted Rehab Admission Coordinator note recommend SNF rather than CIR.  Agree with need for SNF and placed in recommendations.   Despina Pole 11/27/2015, 4:21 PM Carita Pian Sanjuana Kava, [place] Pager [phone]

## 2015-11-27 NOTE — Progress Notes (Signed)
Rehab Admissions Coordinator Note:  Patient was screened by Retta Diones for appropriateness for an Inpatient Acute Rehab Consult.  At this time, we are recommending [facility].Likely does not have the medical necessity to support an acute inpatient rehab admission  Jodell Cipro M 11/27/2015, 8:54 AM  I can be reached at [phone].

## 2015-11-28 ENCOUNTER — Other Ambulatory Visit (HOSPITAL_COMMUNITY): Payer: Medicare Other

## 2015-11-28 DIAGNOSIS — I10 Essential (primary) hypertension: Secondary | ICD-10-CM | POA: Diagnosis not present

## 2015-11-28 DIAGNOSIS — D631 Anemia in chronic kidney disease: Secondary | ICD-10-CM | POA: Diagnosis not present

## 2015-11-28 DIAGNOSIS — N186 End stage renal disease: Secondary | ICD-10-CM | POA: Diagnosis not present

## 2015-11-28 DIAGNOSIS — I950 Idiopathic hypotension: Secondary | ICD-10-CM

## 2015-11-28 LAB — GLUCOSE, CAPILLARY
GLUCOSE-CAPILLARY: 100 mg/dL — AB (ref 65–99)
GLUCOSE-CAPILLARY: 106 mg/dL — AB (ref 65–99)
GLUCOSE-CAPILLARY: 118 mg/dL — AB (ref 65–99)
Glucose-Capillary: 58 mg/dL — ABNORMAL LOW (ref 65–99)
Glucose-Capillary: 187 mg/dL — ABNORMAL HIGH (ref 65–99)

## 2015-11-28 MED ORDER — RENA-VITE PO TABS
1.0000 | ORAL_TABLET | Freq: Every day | ORAL | Status: DC
  Administered 2015-11-28 – 2015-11-30 (×3): 1 via ORAL
  Filled 2015-11-28 (×3): qty 1

## 2015-11-28 NOTE — Progress Notes (Signed)
PROGRESS NOTE    [name]  VPX:[id number] DOB: Nov 19, 1955 DOA: 11/23/2015 PCP: Leeanne Rio, PA-C   Brief Narrative:  [name] is a 60 year old gentleman with a past medical history of end-stage renal disease on hemodialysis Tuesdays Thursdays and Saturdays, history of left low the knee amputation, presented to the emergency department overnight with complaints of left knee pain. He reports being in his usual state of health until last Saturday where he had a fall at his dialysis center. Stated landing on his BKA and has had pain for which he started using results that no patches. In the emergency department he was found to be hypotensive with systolic blood pressures in the 80s. Workup did not reveal an obvious source of infection as it was suspected that opioids may have contributed to hypotension. He stated missing dialysis on 09/22/2015 due to knee pain. During this hospitalization nephrology was consulted.    Assessment & Plan:   Principal Problem:   Hypotension, unspecified Active Problems:   Type 2 diabetes mellitus with diabetic nephropathy, with long-term current use of insulin (HCC)   Hypothyroidism, acquired   Chronic pain   ESRD on dialysis ([place])   S/P BKA (below knee amputation) unilateral (HCC)   Left knee pain   Hypotension   1. Left knee pain  -Unfortunately [name] having a fall at his dialysis center landing on his left knee.  -Films obtained in the emergency department did not reveal evidence of acute fracture or dislocation of left knee. Radiology reporting that total knee arthroplasty was grossly unremarkable in appearance without evidence of loosening.  -Dr Sharol Given evaluated [name] on 11/25/2015, recommended follow up at the office.   2.  Hypotension. -She was found to be hypotensive with systolic blood pressures in the 80s, improving over the course of the day -He is nontoxic, afebrile, nonfocal, as workup did not show obvious source of  infection -Plan to continue watching him off antimicrobial therapy -I suspect narcotic analgesics may have precipitated hypotension, however Fentanyl was stopped on admission and I would have expected this drug to washout by now. He remains hypotensive.  -On 11/26/2015 I spoke with Dr Hollie Salk of Nephrology who recommended infectious workup, will check blood cultures and a 2D Echo. CXR did not reveal PNA, WBC's within normal limits, he has been afebrile. Will not start antimicrobials unless he spikes a fever or source of infection is found.  -On 11/27/2015 he remains nontoxic, blood pressures improving. Blood cultures showing no growth after overnight incubation, awaiting echo.  -On 11/28/2015 he remains afebrile, blood pressures remain in the low-normal range, but overall improved. Blood cultures showing no growth to date. Antimicrobial therapy not indicated.    3.  History of end-stage renal disease -He is currently on the Tuesday Thursday Saturday dialysis scheduled -Nephrology consulted as he reports skipping his hemodialysis yesterday. -On exam he does not appear fluid overloaded -HD during this hospitalization   4.  Insulin dependent diabetes mellitus -Continue glargine 40 units subcutaneous at bedtime   DVT prophylaxis: Heparin Code Status: Full code  Family Communication:  Disposition Plan: PT recommends SNF placement, consult placed to social work for SNF placement which he is agreeable to.   Consultants:   Nephrology   Procedures:   HD  Antimicrobials:       Subjective: Complains of ongoing pain to his left knee   Objective: Vitals:   11/27/15 1715 11/27/15 2103 11/28/15 0503 11/28/15 1020  BP: 104/68 (!) 97/55 (!) 102/54 Marland Kitchen)  102/54  Pulse: 76 75 77 71  Resp: 17 17 18 17   Temp: 98.2 F (36.8 C) 98.4 F (36.9 C) 98.4 F (36.9 C) 98.7 F (37.1 C)  TempSrc: Oral Oral Oral Oral  SpO2: 95% 96% 97% 95%  Weight:  122.6 kg (270 lb 4.5 oz)    Height:         Intake/Output Summary (Last 24 hours) at 11/28/15 1432 Last data filed at 11/28/15 1238  Gross per 24 hour  Intake              590 ml  Output                1 ml  Net              589 ml   Filed Weights   11/27/15 0758 11/27/15 1215 11/27/15 2103  Weight: 125.8 kg (277 lb 5.4 oz) 122.4 kg (269 lb 13.5 oz) 122.6 kg (270 lb 4.5 oz)    Examination:  General exam: Nontoxic appearing Respiratory system: Clear to auscultation. Respiratory effort normal. Cardiovascular system: S1 & S2 heard, RRR. No JVD, murmurs, rubs, gallops or clicks. No pedal edema. Gastrointestinal system: Abdomen is nondistended, soft and nontender. No organomegaly or masses felt. Normal bowel sounds heard. Central nervous system: Alert and oriented. No focal neurological deficits. Extremities: status post left below the knee amputation, stump does not appear infected Psychiatry: Judgement and insight appear normal. Mood & affect appropriate.     Data Reviewed: I have personally reviewed following labs and imaging studies  CBC:  Recent Labs Lab 11/24/15 0000 11/24/15 0623 11/25/15 0830 11/27/15 1257  WBC 4.3 3.6* 3.7* 4.2  NEUTROABS 3.1  --   --   --   HGB 9.6* 8.5* 8.8* 8.0*  HCT 30.9* 27.8* 28.5* 25.7*  MCV 101.0* 100.7* 99.7 98.1  PLT 132* 104* 110* 269*   Basic Metabolic Panel:  Recent Labs Lab 11/24/15 0000 11/24/15 0623 11/25/15 0900 11/27/15 1250  NA 136 138 134* 132*  K 5.5* 5.6* 6.5* 4.9  CL 102 96* 98* 96*  CO2 19* 18* 17* 22  GLUCOSE 171* 136* 124* 106*  BUN 83* 89* 108* 68*  CREATININE 9.00* 9.37* 10.33* 7.41*  CALCIUM 8.1* 8.0* 7.9* 8.0*  PHOS  --   --  6.9* 5.5*   GFR: Estimated Creatinine Clearance: 14.5 mL/min (by C-G formula based on SCr of 7.41 mg/dL (H)). Liver Function Tests:  Recent Labs Lab 11/24/15 0623 11/25/15 0900 11/27/15 1250  AST 10*  --   --   ALT 8*  --   --   ALKPHOS 79  --   --   BILITOT 1.4*  --   --   PROT 6.2*  --   --   ALBUMIN 2.5*  2.6* 2.4*   No results for input(s): LIPASE, AMYLASE in the last 168 hours. No results for input(s): AMMONIA in the last 168 hours. Coagulation Profile: No results for input(s): INR, PROTIME in the last 168 hours. Cardiac Enzymes: No results for input(s): CKTOTAL, CKMB, CKMBINDEX, TROPONINI in the last 168 hours. BNP (last 3 results) No results for input(s): PROBNP in the last 8760 hours. HbA1C: No results for input(s): HGBA1C in the last 72 hours. CBG:  Recent Labs Lab 11/26/15 2123 11/27/15 1313 11/27/15 1712 11/28/15 0819 11/28/15 1204  GLUCAP 242* 174* 157* 100* 106*   Lipid Profile: No results for input(s): CHOL, HDL, LDLCALC, TRIG, CHOLHDL, LDLDIRECT in the last 72 hours. Thyroid Function Tests:  No results for input(s): TSH, T4TOTAL, FREET4, T3FREE, THYROIDAB in the last 72 hours. Anemia Panel:  Recent Labs  11/27/15 1422  FERRITIN 798*  TIBC 186*  IRON 57   Sepsis Labs:  Recent Labs Lab 11/24/15 0209  LATICACIDVEN 1.61    Recent Results (from the past 240 hour(s))  MRSA PCR Screening     Status: Abnormal   Collection Time: 11/25/15  4:48 AM  Result Value Ref Range Status   MRSA by PCR POSITIVE (A) NEGATIVE Final    Comment:        The GeneXpert MRSA Assay (FDA approved for NASAL specimens only), is one component of a comprehensive MRSA colonization surveillance program. It is not intended to diagnose MRSA infection nor to guide or monitor treatment for MRSA infections. RESULT CALLED TO, READ BACK BY AND VERIFIED WITH: E.STREET RN AT 0745 11/25/15 BY A.DAVIS   Culture, blood (Routine X 2) w Reflex to ID Panel     Status: None (Preliminary result)   Collection Time: 11/26/15  2:44 PM  Result Value Ref Range Status   Specimen Description BLOOD LEFT HAND  Final   Special Requests BOTTLES DRAWN AEROBIC AND ANAEROBIC 5CC  Final   Culture NO GROWTH < 24 HOURS  Final   Report Status PENDING  Incomplete  Culture, blood (Routine X 2) w Reflex to ID  Panel     Status: None (Preliminary result)   Collection Time: 11/26/15  3:53 PM  Result Value Ref Range Status   Specimen Description BLOOD LEFT ARM  Final   Special Requests IN PEDIATRIC BOTTLE 3CC  Final   Culture NO GROWTH < 24 HOURS  Final   Report Status PENDING  Incomplete         Radiology Studies: No results found.      Scheduled Meds: . atorvastatin  40 mg Oral q1800  . Chlorhexidine Gluconate Cloth  6 each Topical Q0600  . ferrous sulfate  325 mg Oral BID  . gabapentin  100 mg Oral TID  . heparin  5,000 Units Subcutaneous Q8H  . Influenza vac split quadrivalent PF  0.5 mL Intramuscular Tomorrow-1000  . insulin aspart  0-5 Units Subcutaneous QHS  . insulin aspart  0-9 Units Subcutaneous TID WC  . insulin glargine  40 Units Subcutaneous QHS  . levothyroxine  200 mcg Oral QAC breakfast  . levothyroxine  25 mcg Oral QAC breakfast  . methenamine  500 mg Oral QID  . multivitamin  1 tablet Oral QHS  . mupirocin ointment  1 application Nasal BID  . sevelamer carbonate  800 mg Oral TID WC   Continuous Infusions:    LOS: 2 days    Time spent: 25 min    Kelvin Cellar, MD [facility] Pager [phone]  If 7PM-7AM, please contact night-coverage [email or web address] Password [email or web address] 11/28/2015, 2:32 PM

## 2015-11-28 NOTE — Progress Notes (Signed)
Subjective:  Co Headache/ tolerated HD yest on Schedule   Objective Vital signs in last 24 hours: Vitals:   11/27/15 1715 11/27/15 2103 11/28/15 0503 11/28/15 1020  BP: 104/68 (!) 97/55 (!) 102/54 (!) 102/54  Pulse: 76 75 77 71  Resp: 17 17 18 17   Temp: 98.2 F (36.8 C) 98.4 F (36.9 C) 98.4 F (36.9 C) 98.7 F (37.1 C)  TempSrc: Oral Oral Oral Oral  SpO2: 95% 96% 97% 95%  Weight:  122.6 kg (270 lb 4.5 oz)    Height:       Weight change: 0.8 kg (1 lb 12.2 oz)  Physical Exam: General: alert/ chronically ill appearing WM OX3 / NAD Heart: RRR, 1/6 sem , no rub  Lungs: CTA  No c/w/r Abdomen: Obese soft/ Urostomy in RLQ Extremities:L BKA/ 1+ R Pedal edema with chronic venous skin changes lower leg  Dialysis Access: pos bruit  LUA AVF   [facility] 4 hrs 25 min Bath 2K / 2.5 CaDialyzer F200, Heparin 6000 bolus then 700 u/ hr . Access LUE AVF Aranesp 120 mcg q Thursday Renvela 800 mg 2 tabs with meals and snacks No IV iron  Problem/Plan:  1 Hypotension:  Improved after  Fentanyl patches  removed. Consider infectious etiologies--> BC no growth so far / IF persistently hypotensive, TTE, no role for abx yet 2 ESRD: TTS. Providing HD on normal schedule. 3 Hypertension/volume: holding home BP meds.  Well over OP EDW, (252 lbs),BUT Needs STAND WTS/ unclear if this is accurate or not but he does have volume to give./ Yest.  3L UFG tolerated  4. Anemia of ESRD:  hgb 8.0 ,on Aranesp 120 mcg q week starting  9/28,  iron studies pending 5. Metabolic Bone Disease: on Renvela as OP , no VDRA listed on meds, Phos 6.9>5.5 6. Knee pain: per primary.  Xrays showing no fracture and no hip dislocation. Dr. Sharol Given saw  ="will fu in OV"  7. Disposition = noted need for DC to rehab  Op facility   Ernest Haber, PA-C [place] [phone] 11/28/2015,11:43 AM  LOS: 2 days   Labs: Basic Metabolic Panel:  Recent Labs Lab  11/24/15 0623 11/25/15 0900 11/27/15 1250  NA 138 134* 132*  K 5.6* 6.5* 4.9  CL 96* 98* 96*  CO2 18* 17* 22  GLUCOSE 136* 124* 106*  BUN 89* 108* 68*  CREATININE 9.37* 10.33* 7.41*  CALCIUM 8.0* 7.9* 8.0*  PHOS  --  6.9* 5.5*   Liver Function Tests:  Recent Labs Lab 11/24/15 0623 11/25/15 0900 11/27/15 1250  AST 10*  --   --   ALT 8*  --   --   ALKPHOS 79  --   --   BILITOT 1.4*  --   --   PROT 6.2*  --   --   ALBUMIN 2.5* 2.6* 2.4*   No results for input(s): LIPASE, AMYLASE in the last 168 hours. No results for input(s): AMMONIA in the last 168 hours. CBC:  Recent Labs Lab 11/24/15 0000 11/24/15 0623 11/25/15 0830 11/27/15 1257  WBC 4.3 3.6* 3.7* 4.2  NEUTROABS 3.1  --   --   --   HGB 9.6* 8.5* 8.8* 8.0*  HCT 30.9* 27.8* 28.5* 25.7*  MCV 101.0* 100.7* 99.7 98.1  PLT 132* 104* 110* 114*   Cardiac Enzymes: No results for input(s): CKTOTAL, CKMB, CKMBINDEX, TROPONINI in the last 168 hours. CBG:  Recent Labs Lab  11/26/15 1706 11/26/15 2123 11/27/15 1313 11/27/15 1712 11/28/15 0819  GLUCAP 141* 242* 174* 157* 100*    Studies/Results: No results found. Medications:   . atorvastatin  40 mg Oral q1800  . Chlorhexidine Gluconate Cloth  6 each Topical Q0600  . ferrous sulfate  325 mg Oral BID  . gabapentin  100 mg Oral TID  . heparin  5,000 Units Subcutaneous Q8H  . Influenza vac split quadrivalent PF  0.5 mL Intramuscular Tomorrow-1000  . insulin aspart  0-5 Units Subcutaneous QHS  . insulin aspart  0-9 Units Subcutaneous TID WC  . insulin glargine  40 Units Subcutaneous QHS  . levothyroxine  200 mcg Oral QAC breakfast  . levothyroxine  25 mcg Oral QAC breakfast  . methenamine  500 mg Oral QID  . mupirocin ointment  1 application Nasal BID  . sevelamer carbonate  800 mg Oral TID WC

## 2015-11-29 ENCOUNTER — Other Ambulatory Visit (HOSPITAL_COMMUNITY): Payer: Medicare Other

## 2015-11-29 LAB — GLUCOSE, CAPILLARY
GLUCOSE-CAPILLARY: 153 mg/dL — AB (ref 65–99)
GLUCOSE-CAPILLARY: 104 mg/dL — AB (ref 65–99)
GLUCOSE-CAPILLARY: 148 mg/dL — AB (ref 65–99)
Glucose-Capillary: 119 mg/dL — ABNORMAL HIGH (ref 65–99)
Glucose-Capillary: 171 mg/dL — ABNORMAL HIGH (ref 65–99)

## 2015-11-29 MED ORDER — TRAMADOL HCL 50 MG PO TABS: 50.0000 mg | ORAL_TABLET | Freq: Four times a day (QID) | ORAL | 0 refills | Status: DC | PRN

## 2015-11-29 NOTE — NC FL2 (Signed)
[place] MEDICAID FL2 LEVEL OF CARE SCREENING TOOL     IDENTIFICATION  Patient Name: [name] Birthdate: [birth year] Sex: male Admission Date (Current Location): 11/23/2015  [facility] and [place] Number:  Herbalist and Address:  [facility], [place] [address], [place], [place] [address]      Provider Number: [id number]  Attending Physician Name and Address:  Kelvin Cellar, MD  Relative Name and Phone Number:  [place], Boy - [phone]; [phone]; (mobile) [phone]     Current Level of Care: Hospital Recommended Level of Care: [place] Prior Approval Number:    Date Approved/Denied:   PASRR Number:    Discharge Plan: SNF    Current Diagnoses: Patient Active Problem List   Diagnosis Date Noted  . Hypotension, unspecified 11/24/2015  . Left knee pain 11/24/2015  . Hypotension 11/24/2015  . Decubitus ulcer of buttock 01/22/2015  . S/P BKA (below knee amputation) unilateral ([place]) 01/22/2015  . Chronic venous insufficiency 09/08/2014  . Diabetes mellitus, type 2 ([place]) 09/08/2014  . ESRD on dialysis ([place]) 09/08/2014  . Hyperlipidemia 12/23/2013  . Diabetic foot ([place]) 11/20/2013  . Chronic pain 05/21/2013  . Amputated below knee ([place]) 05/17/2013  . Chronic interstitial cystitis 02/06/2013  . UI (urinary incontinence) 10/29/2012  . CKD (chronic kidney disease) stage 3, GFR 30-59 ml/min 08/18/2012  . Hypothyroidism, acquired 08/04/2012  . OBESITY 09/21/2009  . Essential hypertension 09/21/2009  . DIVERTICULOSIS OF COLON 09/21/2009  . Type 2 diabetes mellitus with diabetic nephropathy, with long-term current use of insulin ([place]) 10/01/2007  . DVT 10/01/2007    Orientation RESPIRATION BLADDER Height & Weight     Self, Time, Situation, Place  Normal (Patient has sleep apnea and uses CPAP machine at night) Continent (Has urostomy pouch - uses 1-piece Convatec pouch with barrier ring Kellie Simmering #3).  Kellie Simmering [phone]) Weight: 270 lb 4.5 oz (122.6 kg) Height:  5\' 11"  (180.3 cm)  BEHAVIORAL SYMPTOMS/MOOD NEUROLOGICAL BOWEL NUTRITION STATUS      Incontinent Diet (Low sodium - Heart healthy)  AMBULATORY STATUS COMMUNICATION OF NEEDS Skin   Extensive Assist (Patient left BKA - uses wheelchair) Verbally Normal                       Personal Care Assistance Level of Assistance  Bathing, Feeding, Dressing Bathing Assistance: Maximum assistance Feeding assistance: Independent Dressing Assistance: Maximum assistance     Functional Limitations Info  Sight, Hearing, Speech Sight Info: Adequate Hearing Info: Adequate Speech Info: Adequate    SPECIAL CARE FACTORS FREQUENCY  PT (By licensed PT)     PT Frequency: Evaluation 9/29 and a minimum of 3X per week therapy recommended              Contractures Contractures Info: Not present    Additional Factors Info  Code Status, Allergies, Insulin Sliding Scale, Isolation Precautions Code Status Info: Full Allergies Info: No known allergies   Insulin Sliding Scale Info: Insulin 0-5 Units daily at bedtime; Insulin 0-9 Units 3X daily with meals Isolation Precautions Info: MRSA PCR nares     Current Medications (11/29/2015):  This is the current hospital active medication list Current Facility-Administered Medications  Medication Dose Route Frequency Provider Last Rate Last Dose  . 0.9 %  sodium chloride infusion  100 mL Intravenous PRN Madelon Lips, MD      . 0.9 %  sodium chloride infusion  100 mL Intravenous PRN Madelon Lips, MD      .  acetaminophen (TYLENOL) tablet 650 mg  650 mg Oral Q6H PRN Edwin Dada, MD   650 mg at 11/29/15 7353   Or  . acetaminophen (TYLENOL) suppository 650 mg  650 mg Rectal Q6H PRN Edwin Dada, MD      . alteplase (CATHFLO ACTIVASE) injection 2 mg  2 mg Intracatheter Once PRN Madelon Lips, MD      . atorvastatin (LIPITOR) tablet 40 mg  40 mg Oral q1800 Edwin Dada, MD    40 mg at 11/28/15 1722  . Chlorhexidine Gluconate Cloth 2 % PADS 6 each  6 each Topical Q0600 Kelvin Cellar, MD      . ferrous sulfate tablet 325 mg  325 mg Oral BID Edwin Dada, MD   325 mg at 11/29/15 1041  . gabapentin (NEURONTIN) capsule 100 mg  100 mg Oral TID Edwin Dada, MD   100 mg at 11/29/15 1042  . heparin injection 1,000 Units  1,000 Units Dialysis PRN Madelon Lips, MD      . heparin injection 1,000 Units  1,000 Units Dialysis PRN Madelon Lips, MD      . heparin injection 5,000 Units  5,000 Units Subcutaneous Q8H Edwin Dada, MD   5,000 Units at 11/29/15 0551  . heparin injection 6,000 Units  6,000 Units Dialysis PRN Madelon Lips, MD   6,000 Units at 11/25/15 1349  . Influenza vac split quadrivalent PF (FLUARIX) injection 0.5 mL  0.5 mL Intramuscular Tomorrow-1000 Kelvin Cellar, MD      . insulin aspart (novoLOG) injection 0-5 Units  0-5 Units Subcutaneous QHS Edwin Dada, MD   2 Units at 11/26/15 2204  . insulin aspart (novoLOG) injection 0-9 Units  0-9 Units Subcutaneous TID WC Edwin Dada, MD   2 Units at 11/27/15 1801  . insulin glargine (LANTUS) injection 40 Units  40 Units Subcutaneous QHS Edwin Dada, MD   40 Units at 11/28/15 2207  . levothyroxine (SYNTHROID, LEVOTHROID) tablet 200 mcg  200 mcg Oral QAC breakfast Edwin Dada, MD   200 mcg at 11/29/15 0843  . levothyroxine (SYNTHROID, LEVOTHROID) tablet 25 mcg  25 mcg Oral QAC breakfast Edwin Dada, MD   25 mcg at 11/29/15 0844  . lidocaine (PF) (XYLOCAINE) 1 % injection 5 mL  5 mL Intradermal PRN Madelon Lips, MD      . lidocaine-prilocaine (EMLA) cream 1 application  1 application Topical PRN Madelon Lips, MD      . methenamine (MANDELAMINE) tablet 500 mg  500 mg Oral QID Kelvin Cellar, MD   500 mg at 11/29/15 1041  . multivitamin (RENA-VIT) tablet 1 tablet  1 tablet Oral QHS Ernest Haber, PA-C   1 tablet at 11/28/15 2154  .  mupirocin ointment (BACTROBAN) 2 % 1 application  1 application Nasal BID Kelvin Cellar, MD   1 application at 29/92/42 1042  . ondansetron (ZOFRAN) tablet 4 mg  4 mg Oral Q6H PRN Edwin Dada, MD       Or  . ondansetron (ZOFRAN) injection 4 mg  4 mg Intravenous Q6H PRN Edwin Dada, MD      . pentafluoroprop-tetrafluoroeth (GEBAUERS) aerosol 1 application  1 application Topical PRN Madelon Lips, MD      . senna-docusate (Senokot-S) tablet 1 tablet  1 tablet Oral QHS PRN Edwin Dada, MD      . sevelamer carbonate (RENVELA) tablet 800 mg  800 mg Oral TID WC Edwin Dada, MD   800  mg at 11/29/15 0844  . traMADol (ULTRAM) tablet 50 mg  50 mg Oral Q6H PRN Edwin Dada, MD   50 mg at 11/29/15 0550     Discharge Medications: Please see discharge summary for a list of discharge medications.  Relevant Imaging Results:  Relevant Lab Results:   Additional Information SS# [id number].  Dailysis patient TTS - [facility] on [place].    Sable Feil, LCSW

## 2015-11-29 NOTE — Progress Notes (Signed)
Echo attempted. Nurse would like the test done at another time.

## 2015-11-29 NOTE — Progress Notes (Signed)
This nurse tried to empty urostomy bag and pt refused. He stated that he only empties it once every 3 days. The bag was a little less than half full. Will inform on coming nurse to monitor bag.

## 2015-11-29 NOTE — Care Management Important Message (Signed)
Important Message  Patient Details  Name: [name] MRN: [id number] Date of Birth: 1955/09/08   Medicare Important Message Given:  Yes    Lacretia Leigh, RN 11/29/2015, 9:16 AM

## 2015-11-29 NOTE — Progress Notes (Signed)
Physical Therapy Treatment Patient Details Name: [name] MRN: [id number] DOB: [birth year] Today's Date: 11/29/2015    History of Present Illness Patient is a 60 yo male admitted 11/23/15 after a fall.  Patient with Lt BKA stump pain, hypotension.   PMH:  Lt BKA, CKD, on HD, anemia, anxiety, arthritis, DM, neuropathy, PVD, chronic pain, HTN, obesity, OSA, Lt TKA    PT Comments    Making slow progress; continues to be quite anxious, especially with the anticipation of pain -- leading to difficulty with bed mobility and transfers; Asked pt to bring in his prosthesis for lateral scooting OOB to recliner next session (tells me his own sliding board is in the room);   My impression is that [name] is predominantly at a sliding board, lateral scoot transfer to wc functional level; Will need to ask about and understand the nature of his recent falls next session.   Follow Up Recommendations  SNF;Supervision/Assistance - 24 hour     Equipment Recommendations  None recommended by PT    Recommendations for Other Services       Precautions / Restrictions Precautions Precautions: Fall Precaution Comments: Has had several falls at home Required Braces or Orthoses: Other Brace/Splint Other Brace/Splint: Prosthesis LLE    Mobility  Bed Mobility Overal bed mobility: Needs Assistance Bed Mobility: Supine to Sit;Sit to Supine     Supine to sit: +2 for physical assistance;Mod assist Sit to supine: +2 for physical assistance;Mod assist   General bed mobility comments: Used bed pad to position hips closer to EOB; Pt put forth at least 50% effort in pushing self up to sitting using rail, HOB elevated; Pain limiting tolerance of moving, and pt required heavy mod assist to help LEs back onto bed  Transfers Overall transfer level: Needs assistance Equipment used:  (bed pad) Transfers: Lateral/Scoot Transfers (Simulated)          Lateral/Scoot Transfers: +2 safety/equipment;Max  assist General transfer comment: Simulated lateral scoot transfer by scooting to [facility] before laying back down; difficulty with weight shifting to scoot, without prostesis, and therefore had less stability for pivot point on feet during transfer; +2 and heavy use of bed pad to scoot  Ambulation/Gait                 Stairs            Wheelchair Mobility    Modified Rankin (Stroke Patients Only)       Balance Overall balance assessment: Needs assistance Sitting-balance support: Single extremity supported;Bilateral upper extremity supported;No upper extremity supported Sitting balance-Leahy Scale: Fair                              Cognition Arousal/Alertness: Awake/alert Behavior During Therapy: WFL for tasks assessed/performed;Restless;Anxious Overall Cognitive Status: Within Functional Limits for tasks assessed                      Exercises      General Comments General comments (skin integrity, edema, etc.): BP stable during session; see Doc Flowsheets      Pertinent Vitals/Pain Pain Assessment: 0-10 Pain Score: 8  Pain Location: L knee pain, and headache Pain Descriptors / Indicators: Aching;Grimacing;Headache Pain Intervention(s): Limited activity within patient's tolerance;Monitored during session;Repositioned    Home Living                      Prior Function  PT Goals (current goals can now be found in the care plan section) Acute Rehab PT Goals Patient Stated Goal: To get stronger, return to independence PT Goal Formulation: With patient Time For Goal Achievement: 12/10/15 Potential to Achieve Goals: Good Progress towards PT goals: Progressing toward goals (Slowly)    Frequency    Min 3X/week      PT Plan Current plan remains appropriate    Co-evaluation             End of Session Equipment Utilized During Treatment:  (bed pad) Activity Tolerance: Patient limited by pain Patient left: in  bed;with call bell/phone within reach (bed in semi-chair position)     Time: 9872-1587 PT Time Calculation (min) (ACUTE ONLY): 25 min  Charges:  $Therapeutic Activity: 23-37 mins                    G CodesRoney Marion [name] 11/29/2015, 10:08 AM  [name] Marion, [place] Pager [phone] Office [phone]

## 2015-11-29 NOTE — Discharge Summary (Addendum)
Physician Discharge Summary  [name]:[id number] DOB: 10-12-1955 DOA: 11/23/2015  PCP: [name] Rio, PA-C  Admit date: 11/23/2015 Discharge date: 11/30/2015  Time spent: 35 minutes  Recommendations for Outpatient Follow-up:  1. [name] is a dialysis patient, T, TH and Sat schedule  2. Admitted for hypotension, no source of infection, could have been related to narcotic analgesics.  3. Please follow up on 2D echo performed on 11/30/2015, prior to discharge to SNF 4. He was discharged to SNF   Discharge Diagnoses:  Principal Problem:   Hypotension, unspecified Active Problems:   Type 2 diabetes mellitus with diabetic nephropathy, with long-term current use of insulin (HCC)   Hypothyroidism, acquired   Chronic pain   ESRD on dialysis ([place])   S/P BKA (below knee amputation) unilateral (HCC)   Left knee pain   Hypotension   Discharge Condition: Stable  Diet recommendation: Carb Mod/Renal Diet  Filed Weights   11/27/15 0758 11/27/15 1215 11/27/15 2103  Weight: 125.8 kg (277 lb 5.4 oz) 122.4 kg (269 lb 13.5 oz) 122.6 kg (270 lb 4.5 oz)    History of present illness:  [name] is a 60 y.o. male with a past medical history significant for ESRD on HD TThS, chronic pain no longer on opioids, IDDM, and left BKA who presents with left knee pain after a fall and is found incidentally to be hypotensive.  The patient was in his usual state of health until Saturday when he was at dialysis, making a transfer back into his wheelchair and fell, landing on his left side where his BKA is. After the fall he developed severe constant pain in the left buttock, hip, and knee.  He found his old fentanyl patches and put them on to treat the pain, but today was still and so much pain that he was not able to go to dialysis, and so he came to the ER in the evening.  Hospital Course:  [name] is a 60 year old gentleman with a past medical history of end-stage renal disease on  hemodialysis Tuesdays Thursdays and Saturdays, history of left low the knee amputation, presented to the emergency department overnight with complaints of left knee pain. He reports being in his usual state of health until last Saturday where he had a fall at his dialysis center. Stated landing on his BKA and has had pain for which he started using results that no patches. In the emergency department he was found to be hypotensive with systolic blood pressures in the 80s. Workup did not reveal an obvious source of infection as it was suspected that opioids may have contributed to hypotension. He stated missing dialysis on 09/22/2015 due to knee pain. During this hospitalization nephrology was consulted.   1. Left knee pain  -Unfortunately [name]. [name] having a fall at his dialysis center landing on his left knee.  -Films obtained in the emergency department did not reveal evidence of acute fracture or dislocation of left knee. Radiology reporting that total knee arthroplasty was grossly unremarkable in appearance without evidence of loosening.  -[name] evaluated [name] on 11/25/2015, recommended follow up at the office.   2.  Hypotension. -She was found to be hypotensive with systolic blood pressures in the 80s, improving over the course of the day -He is nontoxic, afebrile, nonfocal, as workup did not show obvious source of infection -Plan to continue watching him off antimicrobial therapy -I suspect narcotic analgesics may have precipitated hypotension, however Fentanyl was stopped on  admission and I would have expected this drug to washout by now. He remains hypotensive.  -On 11/26/2015 I spoke with [name] of Nephrology who recommended infectious workup, will check blood cultures. CXR did not reveal PNA, WBC's within normal limits, he has been afebrile. Will not start antimicrobials unless he spikes a fever or source of infection is found.  -On 11/27/2015 he remains nontoxic, blood pressures  improving. Blood cultures showing no growth after overnight incubation, awaiting echo.  -On 11/28/2015 he remains afebrile, blood pressures remain in the low-normal range, but overall improved. Blood cultures showing no growth to date. Antimicrobial therapy not indicated.   -On 11/29/2015 blood cultures from 12/26/2015 showing no growth to date. He remains stable, nonfocal, nontoxic on my evaluation. Plan to discharge to SNF when bed available.   3.  History of end-stage renal disease -He is currently on the Tuesday Thursday Saturday dialysis scheduled -Nephrology consulted as he reports skipping his hemodialysis yesterday. -On exam he does not appear fluid overloaded -HD during this hospitalization   4.  Insulin dependent diabetes mellitus -Continue glargine 40 units subcutaneous at bedtime  Procedures:  HD  Consultations:  Nephrology  Discharge Exam: Vitals:   11/28/15 1020 11/28/15 1701  BP: (!) 102/54 101/64  Pulse: 71 72  Resp: 17 16  Temp: 98.7 F (37.1 C) 98.1 F (36.7 C)    General exam: Nontoxic appearing Respiratory system: Clear to auscultation. Respiratory effort normal. Cardiovascular system: S1 & S2 heard, RRR. No JVD, murmurs, rubs, gallops or clicks. No pedal edema. Gastrointestinal system: Abdomen is nondistended, soft and nontender. No organomegaly or masses felt. Normal bowel sounds heard. Central nervous system: Alert and oriented. No focal neurological deficits. Extremities: status post left below the knee amputation, stump does not appear infected Psychiatry: Judgement and insight appear normal. Mood & affect appropriate.   Discharge Instructions   Discharge Instructions    Call MD for:    Complete by:  As directed    Call MD for:  difficulty breathing, headache or visual disturbances    Complete by:  As directed    Call MD for:  extreme fatigue    Complete by:  As directed    Call MD for:  hives    Complete by:  As directed    Call MD for:   persistant dizziness or light-headedness    Complete by:  As directed    Call MD for:  persistant nausea and vomiting    Complete by:  As directed    Call MD for:  redness, tenderness, or signs of infection (pain, swelling, redness, odor or green/yellow discharge around incision site)    Complete by:  As directed    Call MD for:  severe uncontrolled pain    Complete by:  As directed    Call MD for:  temperature >100.4    Complete by:  As directed    Diet - low sodium heart healthy    Complete by:  As directed    Increase activity slowly    Complete by:  As directed      Current Discharge Medication List    START taking these medications   Details  traMADol (ULTRAM) 50 MG tablet Take 1 tablet (50 mg total) by mouth every 6 (six) hours as needed for moderate pain. Qty: 15 tablet, Refills: 0      CONTINUE these medications which have NOT CHANGED   Details  Ascorbic Acid (VITAMIN C PO) Take 1 tablet by mouth  2 (two) times daily.    atorvastatin (LIPITOR) 40 MG tablet TAKE 1 TABLET (40 MG TOTAL) BY MOUTH DAILY. Qty: 90 tablet, Refills: 1    ferrous sulfate 325 (65 FE) MG tablet Take 1 tablet (325 mg total) by mouth 2 (two) times daily. Qty: 180 tablet, Refills: 1    gabapentin (NEURONTIN) 100 MG capsule TAKE 1 CAPSULE (100 MG TOTAL) BY MOUTH 3 (THREE) TIMES DAILY. Qty: 90 capsule, Refills: 1    insulin aspart (NOVOLOG) 100 UNIT/ML injection INJECT 3 UNITS THREE TIMES DAILY WITH MEALS. AND AT NIGHTIME IF BLOOD SUGARS GREATER THAN 150 DX: E11.29 Qty: 40 mL, Refills: 1    Insulin Glargine (BASAGLAR KWIKPEN) 100 UNIT/ML SOPN Inject 40 Units into the skin at bedtime. Refills: 1    Insulin Syringe-Needle U-100 (B-D INS SYRINGE 0.5CC/31GX5/16) 31G X 5/16" 0.5 ML MISC USE 4 TIMES A DAY FOR DIABETES BLOOD GLUCOSE TESTING DX: E11.29 Qty: 300 each, Refills: 1    !! levothyroxine (SYNTHROID, LEVOTHROID) 200 MCG tablet TAKE 1 TABLET BY MOUTH EVERY MORNING Qty: 30 tablet, Refills: 2     !! levothyroxine (SYNTHROID, LEVOTHROID) 25 MCG tablet TAKE 1 TABLET (25 MCG TOTAL) BY MOUTH DAILY BEFORE BREAKFAST. Qty: 90 tablet, Refills: 1    methenamine (HIPREX) 1 g tablet Take 1 g by mouth 2 (two) times daily.    Multiple Vitamins-Minerals (MULTIVITAMINS THER. W/MINERALS) TABS Take 1 tablet by mouth daily.     sevelamer carbonate (RENVELA) 800 MG tablet Take 800 mg by mouth 3 (three) times daily with meals.     !! - Potential duplicate medications found. Please discuss with provider.    STOP taking these medications     fentaNYL (DURAGESIC - DOSED MCG/HR) 50 MCG/HR      BAYER CONTOUR TEST test strip        No Known Allergies Follow-up Information    Newt Minion, MD Follow up in 2 week(s).   Specialty:  Orthopedic Surgery Contact information: [place] [address] [phone]        [name] Rio, PA-C Follow up in 1 week(s).   Specialty:  Family Medicine Contact information: [place] [address] [phone]            The results of significant diagnostics from this hospitalization (including imaging, microbiology, ancillary and laboratory) are listed below for reference.    Significant Diagnostic Studies: Dg Ankle Complete Right  Result Date: 11/24/2015 CLINICAL DATA:  Status post fall, with right ankle pain. Initial encounter. EXAM: RIGHT ANKLE - COMPLETE 3+ VIEW COMPARISON:  Right tibia/fibula radiographs performed 11/26/2008 FINDINGS: There is no evidence of acute fracture or dislocation. There is chronic flattening of the ankle mortise and subtalar joint, possibly reflecting remote calcaneal injury or Charcot joint. Diffuse vascular calcifications are seen. Mild soft tissue swelling is noted at the heel. IMPRESSION: 1. No evidence of acute fracture or dislocation. Chronic flattening of the ankle mortise and subtalar joint, possibly reflecting remote calcaneal injury or Charcot joint. 2.  Diffuse vascular calcifications seen. Electronically Signed   By: Garald Balding M.D.   On: 11/24/2015 01:23   Dg Chest Portable 1 View  Result Date: 11/24/2015 CLINICAL DATA:  60 year old male with shortness of breath. History of renal failure. EXAM: PORTABLE CHEST 1 VIEW COMPARISON:  Chest radiograph dated 03/03/2015 FINDINGS: Single portable view of the chest demonstrates stable cardiomegaly. No significant vascular congestion or pulmonary edema. A small focal area of increased  density at the left lung base likely superimposition of the ribs. There is no pleural effusion or pneumothorax. There is scoliosis of the thoracic spine. No acute fracture. Right subclavian/axillary vascular stent. IMPRESSION: Stable cardiomegaly without definite congestion or edema. No focal consolidation. Electronically Signed   By: Anner Crete M.D.   On: 11/24/2015 03:24   Dg Knee Complete 4 Views Left  Result Date: 11/24/2015 CLINICAL DATA:  Status post fall yesterday, with left knee pain. Initial encounter. EXAM: LEFT KNEE - COMPLETE 4+ VIEW COMPARISON:  CT of the abdomen and pelvis performed 04/13/2015 FINDINGS: There is no evidence of acute fracture or dislocation. A total knee arthroplasty with chronic components is noted, without evidence of loosening. The patient is status post below-the-knee amputation. The stump is grossly unremarkable, though not well assessed on radiograph. Scattered vascular calcifications are noted. IMPRESSION: 1. No evidence of acute fracture or dislocation. Total knee arthroplasty is grossly unremarkable in appearance, without evidence of loosening. 2. Scattered vascular calcifications seen. Electronically Signed   By: Garald Balding M.D.   On: 11/24/2015 01:21   Dg Hip Unilat W Or Wo Pelvis 2-3 Views Left  Result Date: 11/24/2015 CLINICAL DATA:  Status post fall yesterday, with left hip pain. Initial encounter. EXAM: DG HIP (WITH OR WITHOUT PELVIS) 2-3V LEFT COMPARISON:  CT of the  abdomen and pelvis from 04/13/2015 FINDINGS: There is no evidence of fracture or dislocation. 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 The right hip arthroplasty is grossly unremarkable in appearance. Surrounding lucency is thought to reflect overlying soft tissues. The sacroiliac joints are grossly unremarkable. Mild degenerative change is noted at the lower lumbar spine. The visualized bowel gas pattern is grossly unremarkable in appearance. Scattered vascular calcifications are seen. IMPRESSION: 1. No evidence of fracture or dislocation. 2. 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 3. Scattered vascular calcifications seen. Electronically Signed   By: Garald Balding M.D.   On: 11/24/2015 01:19    Microbiology: Recent Results (from the past 240 hour(s))  MRSA PCR Screening     Status: Abnormal   Collection Time: 11/25/15  4:48 AM  Result Value Ref Range Status   MRSA by PCR POSITIVE (A) NEGATIVE Final    Comment:        The GeneXpert MRSA Assay (FDA approved for NASAL specimens only), is one component of a comprehensive MRSA colonization surveillance program. It is not intended to diagnose MRSA infection nor to guide or monitor treatment for MRSA infections. RESULT CALLED TO, READ BACK BY AND VERIFIED WITH: E.STREET RN AT 0745 11/25/15 BY A.DAVIS   Culture, blood (Routine X 2) w Reflex to ID Panel     Status: None (Preliminary result)   Collection Time: 11/26/15  2:44 PM  Result Value Ref Range Status   Specimen Description BLOOD LEFT HAND  Final   Special Requests BOTTLES DRAWN AEROBIC AND ANAEROBIC 5CC  Final   Culture NO GROWTH 2 DAYS  Final   Report Status PENDING  Incomplete  Culture, blood (Routine X 2) w Reflex to ID Panel     Status: None (Preliminary result)   Collection Time: 11/26/15  3:53 PM  Result Value Ref Range Status   Specimen Description BLOOD  LEFT ARM  Final   Special Requests IN PEDIATRIC BOTTLE 3CC  Final   Culture NO GROWTH 2 DAYS  Final   Report Status PENDING  Incomplete  Labs: Basic Metabolic Panel:  Recent Labs Lab 11/24/15 0000 11/24/15 0623 11/25/15 0900 11/27/15 1250  NA 136 138 134* 132*  K 5.5* 5.6* 6.5* 4.9  CL 102 96* 98* 96*  CO2 19* 18* 17* 22  GLUCOSE 171* 136* 124* 106*  BUN 83* 89* 108* 68*  CREATININE 9.00* 9.37* 10.33* 7.41*  CALCIUM 8.1* 8.0* 7.9* 8.0*  PHOS  --   --  6.9* 5.5*   Liver Function Tests:  Recent Labs Lab 11/24/15 0623 11/25/15 0900 11/27/15 1250  AST 10*  --   --   ALT 8*  --   --   ALKPHOS 79  --   --   BILITOT 1.4*  --   --   PROT 6.2*  --   --   ALBUMIN 2.5* 2.6* 2.4*   No results for input(s): LIPASE, AMYLASE in the last 168 hours. No results for input(s): AMMONIA in the last 168 hours. CBC:  Recent Labs Lab 11/24/15 0000 11/24/15 0623 11/25/15 0830 11/27/15 1257  WBC 4.3 3.6* 3.7* 4.2  NEUTROABS 3.1  --   --   --   HGB 9.6* 8.5* 8.8* 8.0*  HCT 30.9* 27.8* 28.5* 25.7*  MCV 101.0* 100.7* 99.7 98.1  PLT 132* 104* 110* 114*   Cardiac Enzymes: No results for input(s): CKTOTAL, CKMB, CKMBINDEX, TROPONINI in the last 168 hours. BNP: BNP (last 3 results) No results for input(s): BNP in the last 8760 hours.  ProBNP (last 3 results) No results for input(s): PROBNP in the last 8760 hours.  CBG:  Recent Labs Lab 11/28/15 1204 11/28/15 1700 11/28/15 2105 11/28/15 2159 11/29/15 0707  GLUCAP 106* 118* 58* 187* 119*       Signed:  Kelvin Cellar MD.  [facility] 11/29/2015, 8:21 AM

## 2015-11-29 NOTE — Clinical Social Work Note (Signed)
Clinical Social Work Assessment  Patient Details  Name: [name] MRN: [id number] Date of Birth: Jun 10, 1955  Date of referral:  11/29/15               Reason for consult:  Facility Placement                Permission sought to share information with:  Family Supports Permission granted to share information::  Yes, Verbal Permission Granted  Name::     [name]  Agency::     Relationship::  Wife  Contact Information:  [phone] (h); [phone] (w);  [phone] (m)   Housing/Transportation Living arrangements for the past 2 months:  Single Family Home Source of Information:  Patient Patient Interpreter Needed:  None Criminal Activity/Legal Involvement Pertinent to Current Situation/Hospitalization:  No - Comment as needed Significant Relationships:  Spouse [name]) Lives with:  Spouse Do you feel safe going back to the place where you live?  Yes (Patient feels safe at home but is in agreement with ST rehab for strenghtening before returning home) Need for family participation in patient care:  Yes (Comment)  Care giving concerns:  Patient in agreement with ST rehab before returning home.   Social Worker assessment / plan:  CSW talked with patient at the bedside regarding recommendation of ST rehab. Patient was in bed, sitting up and was alert, oriented and engaged easily with CSW in conversation regarding his discharge disposition. [name] reported that he has been to [name], [name], [name] before and based on the facility responses, decided on [name] as he dialyzes in [name].  Employment status:  Disabled (Comment on whether or not currently receiving Disability) Insurance information:  Medicare, Managed Care PT Recommendations:  [place] / Referral to community resources:  [place] (Patient provided with facility responses)  Patient/Family's Response to care:  Patient  expressed no concerns regarding care during hospitalization.  Patient/Family's Understanding of and Emotional Response to Diagnosis, Current Treatment, and Prognosis:  Not discussed.  Emotional Assessment Appearance:  Appears older than stated age Attitude/Demeanor/Rapport:  Other (Appropriate) Affect (typically observed):  Pleasant, Appropriate Orientation:  Oriented to Self, Oriented to Place, Oriented to  Time, Oriented to Situation Alcohol / Substance use:  Tobacco Use, Alcohol Use, Illicit Drugs (Patient reports that he does not smoke, drink alcohol or use illicit drugs) Psych involvement (Current and /or in the community):  No (Comment)  Discharge Needs  Concerns to be addressed:  Discharge Planning Concerns Readmission within the last 30 days:  No Current discharge risk:  None Barriers to Discharge:  Other (Confirming PASRR number, [place] MUST down)   [name] Salina Mila Homer, LCSW 11/29/2015, 4:40 PM

## 2015-11-29 NOTE — Clinical Social Work Placement (Signed)
   CLINICAL SOCIAL WORK PLACEMENT  NOTE  Date:  11/29/2015  Patient Details  Name: [name] MRN: [id number] Date of Birth: [birth year]  Clinical Social Work is seeking post-discharge placement for this patient at the [place] level of care (*CSW will initial, date and re-position this form in  chart as items are completed):   (Patient provided CSW with his facility preference based on bed offers provided.)   Patient/family provided with [place] Work Department's list of facilities offering this level of care within the geographic area requested by the patient (or if unable, by the patient's family).  Yes   Patient/family informed of their freedom to choose among providers that offer the needed level of care, that participate in Medicare, Medicaid or managed care program needed by the patient, have an available bed and are willing to accept the patient.  Yes   Patient/family informed of [facility]'s ownership interest in [facility] and [facility], as well as of the fact that they are under no obligation to receive care at these facilities.  PASRR submitted to EDS on       PASRR number received on       Existing PASRR number confirmed on  (Unable to confirm PASRR - [place] MUST down.)     FL2 transmitted to all facilities in geographic area requested by pt/family on 11/29/15     FL2 transmitted to all facilities within larger geographic area on       Patient informed that his/her managed care company has contracts with or will negotiate with certain facilities, including the following:        Yes   Patient/family informed of bed offers received.  Patient chooses bed at [facility], [facility]     Physician recommends and patient chooses bed at      Patient to be transferred to [facility], [facility] on  .  Patient to be transferred to facility by       Patient family notified on   of transfer.  Name of family  member notified:        PHYSICIAN       Additional Comment:    _______________________________________________ Sable Feil, LCSW 11/29/2015, 4:47 PM

## 2015-11-29 NOTE — Progress Notes (Signed)
[facility] Progress Note   Subjective: "I didn't know I was leaving today-this is the first time I've heard anything about it".  Pt has DC summary in EPIC, social work is working on [employer] placement. No C/Os.  Objective Vitals:   11/28/15 0503 11/28/15 1020 11/28/15 1701 11/29/15 1000  BP: (!) 102/54 (!) 102/54 101/64 (!) 100/57  Pulse: 77 71 72 70  Resp: 18 17 16 18   Temp: 98.4 F (36.9 C) 98.7 F (37.1 C) 98.1 F (36.7 C) 98.6 F (37 C)  TempSrc: Oral Oral Oral Oral  SpO2: 97% 95% 98% 98%  Weight:      Height:       Physical Exam General: Chronically ill appearing male, NAD Heart: T8,U8, 2/6 systolic M. No R/G Lungs: BBS CTA. No WOB present. Abdomen: RLQ urostomy to gravity drain. Active BS.  Extremities: L BKA. Trace RLE edema.  Dialysis Access: LUA AVF + Bruit.  [facility] 4 hrs 25 min Bath 2K / 2.5 CaDialyzer F200, Heparin 6000 bolus then 700 u/ hr . Access LUE AVF Aranesp 120 mcg q Thursday Renvela 800 mg 2 tabs with meals and snacks  Additional Objective Labs: Basic Metabolic Panel:  Recent Labs Lab 11/24/15 0623 11/25/15 0900 11/27/15 1250  NA 138 134* 132*  K 5.6* 6.5* 4.9  CL 96* 98* 96*  CO2 18* 17* 22  GLUCOSE 136* 124* 106*  BUN 89* 108* 68*  CREATININE 9.37* 10.33* 7.41*  CALCIUM 8.0* 7.9* 8.0*  PHOS  --  6.9* 5.5*   Liver Function Tests:  Recent Labs Lab 11/24/15 0623 11/25/15 0900 11/27/15 1250  AST 10*  --   --   ALT 8*  --   --   ALKPHOS 79  --   --   BILITOT 1.4*  --   --   PROT 6.2*  --   --   ALBUMIN 2.5* 2.6* 2.4*   No results for input(s): LIPASE, AMYLASE in the last 168 hours. CBC:  Recent Labs Lab 11/24/15 0000 11/24/15 0623 11/25/15 0830 11/27/15 1257  WBC 4.3 3.6* 3.7* 4.2  NEUTROABS 3.1  --   --   --   HGB 9.6* 8.5* 8.8* 8.0*  HCT 30.9* 27.8* 28.5* 25.7*  MCV 101.0* 100.7* 99.7 98.1  PLT 132* 104* 110* 114*   Blood Culture    Component Value  Date/Time   SDES BLOOD LEFT ARM 11/26/2015 1553   SPECREQUEST IN PEDIATRIC BOTTLE 3CC 11/26/2015 1553   CULT NO GROWTH 2 DAYS 11/26/2015 1553   REPTSTATUS PENDING 11/26/2015 1553    Cardiac Enzymes: No results for input(s): CKTOTAL, CKMB, CKMBINDEX, TROPONINI in the last 168 hours. CBG:  Recent Labs Lab 11/28/15 1700 11/28/15 2105 11/28/15 2159 11/29/15 0707 11/29/15 1144  GLUCAP 118* 58* 187* 119* 104*   Iron Studies:  Recent Labs  11/27/15 1422  IRON 57  TIBC 186*  FERRITIN 798*   @lablastinr3 @ Studies/Results: No results found. Medications:   . atorvastatin  40 mg Oral q1800  . Chlorhexidine Gluconate Cloth  6 each Topical Q0600  . ferrous sulfate  325 mg Oral BID  . gabapentin  100 mg Oral TID  . heparin  5,000 Units Subcutaneous Q8H  . Influenza vac split quadrivalent PF  0.5 mL Intramuscular Tomorrow-1000  . insulin aspart  0-5 Units Subcutaneous QHS  . insulin aspart  0-9 Units Subcutaneous TID WC  . insulin glargine  40 Units Subcutaneous QHS  .  levothyroxine  200 mcg Oral QAC breakfast  . levothyroxine  25 mcg Oral QAC breakfast  . methenamine  500 mg Oral QID  . multivitamin  1 tablet Oral QHS  . mupirocin ointment  1 application Nasal BID  . sevelamer carbonate  800 mg Oral TID WC     Assessment/Plan:  1. Hypotension:  Improved after  Fentanyl patches  removed. Consider infectious etiologies--> BC no growth X 2 days. Most likely attributed to narcotic use.  2 ESRD: TTS. Providing HD on normal schedule. Will have HD at [facility] on [address] tomorrow.  3 Hypertension/volume: holding home BP meds. Well over OP EDW, (252 lbs),BUT Needs STAND WTS/ unclear if this is accurate or not but he does have volume to give. Last HD 11/27/15 pre wt 125.8 kg Net UF 3000. Post wt 122.4 4 Anemia of ESRD:  hgb 8.0 ,on Aranesp 120 mcg q week starting  9/28,  Fe 57 UIBC 129 TIBC 186 Tsat 31 Ferritin 220.  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 Knee pain: per primary. Xrays showing no fracture and no hip dislocation. Dr. Sharol Given saw  ="will fu in OV" 7. Disposition: DC today to SNF. Have asked social work to come talk to patient.    Rita H. Brown NP-C 11/29/2015, 2:05 PM  [facility] [phone]  Pt seen, examined and agree w A/P as above.  Kelly Splinter MD [employer] pager [phone]   11/29/2015, 3:26 PM

## 2015-11-29 NOTE — Clinical Social Work Note (Signed)
Patient medically stable for discharge today to a skilled facility - [name], however [name] MUST down all day and unable to confirm patient's PASRR number, Attempted to get number from facilities patient has been to [employer] [employer], [place], [facility]), however none of these facilities could find record of patient's [place] number. [name] will d/c to [facility] on Tuesday if CSW able to confirm [place] number.  [name], MSW, LCSW Licensed Clinical Social Worker [place] [phone]

## 2015-11-30 ENCOUNTER — Inpatient Hospital Stay (HOSPITAL_COMMUNITY): Payer: Medicare Other

## 2015-11-30 DIAGNOSIS — R0600 Dyspnea, unspecified: Secondary | ICD-10-CM

## 2015-11-30 LAB — GLUCOSE, CAPILLARY
GLUCOSE-CAPILLARY: 186 mg/dL — AB (ref 65–99)
Glucose-Capillary: 125 mg/dL — ABNORMAL HIGH (ref 65–99)
Glucose-Capillary: 134 mg/dL — ABNORMAL HIGH (ref 65–99)

## 2015-11-30 LAB — ECHOCARDIOGRAM COMPLETE
HEIGHTINCHES: 71 in
WEIGHTICAEL: 4324.54 [oz_av]

## 2015-11-30 MED ORDER — PENTAFLUOROPROP-TETRAFLUOROETH EX AERO
1.0000 | INHALATION_SPRAY | CUTANEOUS | Status: DC | PRN
Start: 2015-11-30 — End: 2015-12-01

## 2015-11-30 MED ORDER — SODIUM CHLORIDE 0.9 % IV SOLN: 100.0000 mL | INTRAVENOUS | Status: DC | PRN

## 2015-11-30 MED ORDER — PERFLUTREN LIPID MICROSPHERE
1.0000 mL | INTRAVENOUS | Status: AC | PRN
  Administered 2015-11-30: 2 mL via INTRAVENOUS
  Filled 2015-11-30: qty 10

## 2015-11-30 MED ORDER — HEPARIN SODIUM (PORCINE) 1000 UNIT/ML DIALYSIS: 1000.0000 [IU] | INTRAMUSCULAR | Status: DC | PRN

## 2015-11-30 MED ORDER — ALTEPLASE 2 MG IJ SOLR: 2.0000 mg | Freq: Once | INTRAMUSCULAR | Status: DC | PRN

## 2015-11-30 MED ORDER — LIDOCAINE HCL (PF) 1 % IJ SOLN: 5.0000 mL | INTRAMUSCULAR | Status: DC | PRN

## 2015-11-30 MED ORDER — LIDOCAINE-PRILOCAINE 2.5-2.5 % EX CREA
1.0000 | TOPICAL_CREAM | CUTANEOUS | Status: DC | PRN
Start: 2015-11-30 — End: 2015-12-01

## 2015-11-30 MED ORDER — HEPARIN SODIUM (PORCINE) 1000 UNIT/ML DIALYSIS
6000.0000 [IU] | Freq: Once | INTRAMUSCULAR | Status: DC
  Filled 2015-11-30: qty 6

## 2015-11-30 NOTE — Progress Notes (Deleted)
CSW Intern received consult from doctor regarding patient needing assistance with transportation home after discharge.  CSW Intern spoke patient about his transportation needs. Patient stated that his family were out of town and he would call his friend as a last resort.  He asked about the cost of an [employer] or a taxi service.  CSW Intern checked the cost of both and patient wanted to go with [employer] because it was cheaper.  Patient was asked patient to download the [employer] app so that he can arrange transportation with this service.  Patient verbalized understanding.  [place] Intern signing off as no other social needs were identified.

## 2015-11-30 NOTE — Consult Note (Signed)
            [facility] CM Primary Care Navigator  11/30/2015  BALLARD BUDNEY 1955/11/06 [id number]  Went to see patient at bedside to identify possible discharge needs but his nursing assistant confirmed that patient is currently in dialysis at this time.  Will try to see patient again at another time.  For additional questions please contact:  Edwena Felty A. Tabita Corbo, BSN, RN-BC [facility] PRIMARY CARE Navigator Cell: [phone]

## 2015-11-30 NOTE — Progress Notes (Signed)
Echocardiogram 2D Echocardiogram has been performed with definity.  [name] 11/30/2015, 9:48 AM

## 2015-11-30 NOTE — Progress Notes (Signed)
PROGRESS NOTE    [name]  PFX:[id number] DOB: 1955/04/30 DOA: 11/23/2015 PCP: Leeanne Rio, PA-C   Brief Narrative:  Mr [name] is a 60 year old gentleman with a past medical history of end-stage renal disease on hemodialysis Tuesdays Thursdays and Saturdays, history of left low the knee amputation, presented to the emergency department overnight with complaints of left knee pain. He reports being in his usual state of health until last Saturday where he had a fall at his dialysis center. Stated landing on his BKA and has had pain for which he started using results that no patches. In the emergency department he was found to be hypotensive with systolic blood pressures in the 80s. Workup did not reveal an obvious source of infection as it was suspected that opioids may have contributed to hypotension. He stated missing dialysis on 09/22/2015 due to knee pain. During this hospitalization nephrology was consulted.    Assessment & Plan:   Principal Problem:   Hypotension, unspecified Active Problems:   Type 2 diabetes mellitus with diabetic nephropathy, with long-term current use of insulin (HCC)   Hypothyroidism, acquired   Chronic pain   ESRD on dialysis ([place])   S/P BKA (below knee amputation) unilateral (HCC)   Left knee pain   Hypotension   1. Left knee pain  -Unfortunately Mr. [name] having a fall at his dialysis center landing on his left knee.  -Films obtained in the emergency department did not reveal evidence of acute fracture or dislocation of left knee. Radiology reporting that total knee arthroplasty was grossly unremarkable in appearance without evidence of loosening.  -Dr Sharol Given evaluated Mr [name] on 11/25/2015, recommended follow up at the office.   2.  Hypotension. -She was found to be hypotensive with systolic blood pressures in the 80s, improving over the course of the day -He is nontoxic, afebrile, nonfocal, as workup did not show obvious source of  infection -Plan to continue watching him off antimicrobial therapy -I suspect narcotic analgesics may have precipitated hypotension, however Fentanyl was stopped on admission and I would have expected this drug to washout by now. He remains hypotensive.  -On 11/26/2015 I spoke with Dr Hollie Salk of Nephrology who recommended infectious workup, will check blood cultures and a 2D Echo. CXR did not reveal PNA, WBC's within normal limits, he has been afebrile. Will not start antimicrobials unless he spikes a fever or source of infection is found.  -On 11/27/2015 he remains nontoxic, blood pressures improving. Blood cultures showing no growth after overnight incubation, awaiting echo.  -On 11/28/2015 he remains afebrile, blood pressures remain in the low-normal range, but overall improved. Blood cultures showing no growth to date. Antimicrobial therapy not indicated.    3.  History of end-stage renal disease -He is currently on the Tuesday Thursday Saturday dialysis scheduled -Nephrology consulted as he reports skipping his hemodialysis yesterday. -On exam he does not appear fluid overloaded -HD during this hospitalization   4.  Insulin dependent diabetes mellitus -Continue glargine 40 units subcutaneous at bedtime  5.  Disposition -Discharge order written yesterday 11/29/2015, however per SW could not confirm PASRR number. Spoke with SW today, will likely be able to get him to SNF today.  -I assessed him today, he remains stable, nontoxic,  Last set of vitals showing BP=106/65, HR=72, RR=18. Exam unchanged. I think he is medically stable for d/c.   DVT prophylaxis: Heparin Code Status: Full code  Family Communication:  Disposition Plan: SNF  Consultants:   Nephrology   Procedures:  HD  Antimicrobials:       Subjective: Complains of ongoing pain to his left knee   Objective: Vitals:   11/29/15 1700 11/29/15 2059 11/30/15 0357 11/30/15 0955  BP: 101/60 99/60 99/63  106/65  Pulse: 73 70  72 72  Resp: 18 18 18 18   Temp: 98.2 F (36.8 C) 98.2 F (36.8 C) 97.8 F (36.6 C) 98 F (36.7 C)  TempSrc: Oral Oral Oral Oral  SpO2: 98% 97% 97% 98%  Weight:      Height:        Intake/Output Summary (Last 24 hours) at 11/30/15 1008 Last data filed at 11/30/15 0900  Gross per 24 hour  Intake              480 ml  Output                1 ml  Net              479 ml   Filed Weights   11/27/15 0758 11/27/15 1215 11/27/15 2103  Weight: 125.8 kg (277 lb 5.4 oz) 122.4 kg (269 lb 13.5 oz) 122.6 kg (270 lb 4.5 oz)    Examination:  General exam: Nontoxic appearing Respiratory system: Clear to auscultation. Respiratory effort normal. Cardiovascular system: S1 & S2 heard, RRR. No JVD, murmurs, rubs, gallops or clicks. No pedal edema. Gastrointestinal system: Abdomen is nondistended, soft and nontender. No organomegaly or masses felt. Normal bowel sounds heard. Central nervous system: Alert and oriented. No focal neurological deficits. Extremities: status post left below the knee amputation, stump does not appear infected Psychiatry: Judgement and insight appear normal. Mood & affect appropriate.     Data Reviewed: I have personally reviewed following labs and imaging studies  CBC:  Recent Labs Lab 11/24/15 0000 11/24/15 0623 11/25/15 0830 11/27/15 1257  WBC 4.3 3.6* 3.7* 4.2  NEUTROABS 3.1  --   --   --   HGB 9.6* 8.5* 8.8* 8.0*  HCT 30.9* 27.8* 28.5* 25.7*  MCV 101.0* 100.7* 99.7 98.1  PLT 132* 104* 110* 902*   Basic Metabolic Panel:  Recent Labs Lab 11/24/15 0000 11/24/15 0623 11/25/15 0900 11/27/15 1250  NA 136 138 134* 132*  K 5.5* 5.6* 6.5* 4.9  CL 102 96* 98* 96*  CO2 19* 18* 17* 22  GLUCOSE 171* 136* 124* 106*  BUN 83* 89* 108* 68*  CREATININE 9.00* 9.37* 10.33* 7.41*  CALCIUM 8.1* 8.0* 7.9* 8.0*  PHOS  --   --  6.9* 5.5*   GFR: Estimated Creatinine Clearance: 14.5 mL/min (by C-G formula based on SCr of 7.41 mg/dL (H)). Liver Function  Tests:  Recent Labs Lab 11/24/15 0623 11/25/15 0900 11/27/15 1250  AST 10*  --   --   ALT 8*  --   --   ALKPHOS 79  --   --   BILITOT 1.4*  --   --   PROT 6.2*  --   --   ALBUMIN 2.5* 2.6* 2.4*   No results for input(s): LIPASE, AMYLASE in the last 168 hours. No results for input(s): AMMONIA in the last 168 hours. Coagulation Profile: No results for input(s): INR, PROTIME in the last 168 hours. Cardiac Enzymes: No results for input(s): CKTOTAL, CKMB, CKMBINDEX, TROPONINI in the last 168 hours. BNP (last 3 results) No results for input(s): PROBNP in the last 8760 hours. HbA1C: No results for input(s): HGBA1C in the last 72 hours. CBG:  Recent Labs Lab 11/29/15 0707 11/29/15 1144 11/29/15 1610 11/29/15  2255 11/30/15 0628  GLUCAP 119* 104* 148* 171* 125*   Lipid Profile: No results for input(s): CHOL, HDL, LDLCALC, TRIG, CHOLHDL, LDLDIRECT in the last 72 hours. Thyroid Function Tests: No results for input(s): TSH, T4TOTAL, FREET4, T3FREE, THYROIDAB in the last 72 hours. Anemia Panel:  Recent Labs  11/27/15 1422  FERRITIN 798*  TIBC 186*  IRON 57   Sepsis Labs:  Recent Labs Lab 11/24/15 0209  LATICACIDVEN 1.61    Recent Results (from the past 240 hour(s))  MRSA PCR Screening     Status: Abnormal   Collection Time: 11/25/15  4:48 AM  Result Value Ref Range Status   MRSA by PCR POSITIVE (A) NEGATIVE Final    Comment:        The GeneXpert MRSA Assay (FDA approved for NASAL specimens only), is one component of a comprehensive MRSA colonization surveillance program. It is not intended to diagnose MRSA infection nor to guide or monitor treatment for MRSA infections. RESULT CALLED TO, READ BACK BY AND VERIFIED WITH: E.STREET RN AT 0745 11/25/15 BY A.DAVIS   Culture, blood (Routine X 2) w Reflex to ID Panel     Status: None (Preliminary result)   Collection Time: 11/26/15  2:44 PM  Result Value Ref Range Status   Specimen Description BLOOD LEFT HAND   Final   Special Requests BOTTLES DRAWN AEROBIC AND ANAEROBIC 5CC  Final   Culture NO GROWTH 3 DAYS  Final   Report Status PENDING  Incomplete  Culture, blood (Routine X 2) w Reflex to ID Panel     Status: None (Preliminary result)   Collection Time: 11/26/15  3:53 PM  Result Value Ref Range Status   Specimen Description BLOOD LEFT ARM  Final   Special Requests IN PEDIATRIC BOTTLE 3CC  Final   Culture NO GROWTH 3 DAYS  Final   Report Status PENDING  Incomplete         Radiology Studies: No results found.      Scheduled Meds: . atorvastatin  40 mg Oral q1800  . Chlorhexidine Gluconate Cloth  6 each Topical Q0600  . ferrous sulfate  325 mg Oral BID  . gabapentin  100 mg Oral TID  . heparin  5,000 Units Subcutaneous Q8H  . Influenza vac split quadrivalent PF  0.5 mL Intramuscular Tomorrow-1000  . insulin aspart  0-5 Units Subcutaneous QHS  . insulin aspart  0-9 Units Subcutaneous TID WC  . insulin glargine  40 Units Subcutaneous QHS  . levothyroxine  200 mcg Oral QAC breakfast  . levothyroxine  25 mcg Oral QAC breakfast  . methenamine  500 mg Oral QID  . multivitamin  1 tablet Oral QHS  . mupirocin ointment  1 application Nasal BID  . sevelamer carbonate  800 mg Oral TID WC   Continuous Infusions:    LOS: 4 days    Time spent: 15 min    Kelvin Cellar, MD [facility] Pager [phone]  If 7PM-7AM, please contact night-coverage [email or web address] Password [email or web address] 11/30/2015, 10:08 AM

## 2015-11-30 NOTE — Progress Notes (Signed)
[facility] called for patient transport to [facility]. Dorthey Sawyer, RN

## 2015-11-30 NOTE — Care Management Note (Signed)
Case Management Note  Patient Details  Name: [name] MRN: [id number] Date of Birth: May 24, 1955  Subjective/Objective:  Hypotension, Type 2 DM                 Action/Plan: Discharge Planning: AVS reviewed:   Expected Discharge Date:                  Expected Discharge Plan:  Skilled Nursing Facility  In-House Referral:  Clinical Social Work  Discharge planning Services  CM Consult  Post Acute Care Choice:  NA Choice offered to:  NA  DME Arranged:  N/A DME Agency:  NA  HH Arranged:  NA HH Agency:  NA  Status of Service:  Completed, signed off  If discussed at [employer] of Stay Meetings, dates discussed:    Additional Comments:  Erenest Rasher, RN 11/30/2015, 10:10 AM

## 2015-11-30 NOTE — Progress Notes (Signed)
Report called to [facility]. Spoke with Roselyn Reef, RN. Dorthey Sawyer, RN

## 2015-12-01 DIAGNOSIS — L89151 Pressure ulcer of sacral region, stage 1: Secondary | ICD-10-CM | POA: Diagnosis not present

## 2015-12-01 LAB — CULTURE, BLOOD (ROUTINE X 2)
Culture: NO GROWTH
Culture: NO GROWTH

## 2015-12-01 NOTE — Progress Notes (Signed)
Pt remains stable and transferred to [facility] via [place]. Dorthey Sawyer, RN

## 2015-12-02 DIAGNOSIS — D631 Anemia in chronic kidney disease: Secondary | ICD-10-CM | POA: Diagnosis not present

## 2015-12-02 DIAGNOSIS — N186 End stage renal disease: Secondary | ICD-10-CM | POA: Diagnosis not present

## 2015-12-02 DIAGNOSIS — E039 Hypothyroidism, unspecified: Secondary | ICD-10-CM | POA: Diagnosis not present

## 2015-12-02 DIAGNOSIS — E1165 Type 2 diabetes mellitus with hyperglycemia: Secondary | ICD-10-CM | POA: Diagnosis not present

## 2015-12-02 DIAGNOSIS — K769 Liver disease, unspecified: Secondary | ICD-10-CM | POA: Diagnosis not present

## 2015-12-02 DIAGNOSIS — N2581 Secondary hyperparathyroidism of renal origin: Secondary | ICD-10-CM | POA: Diagnosis not present

## 2015-12-03 DIAGNOSIS — R197 Diarrhea, unspecified: Secondary | ICD-10-CM | POA: Diagnosis not present

## 2015-12-04 DIAGNOSIS — N186 End stage renal disease: Secondary | ICD-10-CM | POA: Diagnosis not present

## 2015-12-04 DIAGNOSIS — N2581 Secondary hyperparathyroidism of renal origin: Secondary | ICD-10-CM | POA: Diagnosis not present

## 2015-12-04 DIAGNOSIS — D631 Anemia in chronic kidney disease: Secondary | ICD-10-CM | POA: Diagnosis not present

## 2015-12-07 DIAGNOSIS — N186 End stage renal disease: Secondary | ICD-10-CM | POA: Diagnosis not present

## 2015-12-07 DIAGNOSIS — N2581 Secondary hyperparathyroidism of renal origin: Secondary | ICD-10-CM | POA: Diagnosis not present

## 2015-12-07 DIAGNOSIS — D631 Anemia in chronic kidney disease: Secondary | ICD-10-CM | POA: Diagnosis not present

## 2015-12-08 DIAGNOSIS — L89151 Pressure ulcer of sacral region, stage 1: Secondary | ICD-10-CM | POA: Diagnosis not present

## 2015-12-09 DIAGNOSIS — N2581 Secondary hyperparathyroidism of renal origin: Secondary | ICD-10-CM | POA: Diagnosis not present

## 2015-12-09 DIAGNOSIS — D631 Anemia in chronic kidney disease: Secondary | ICD-10-CM | POA: Diagnosis not present

## 2015-12-09 DIAGNOSIS — N186 End stage renal disease: Secondary | ICD-10-CM | POA: Diagnosis not present

## 2015-12-11 DIAGNOSIS — N2581 Secondary hyperparathyroidism of renal origin: Secondary | ICD-10-CM | POA: Diagnosis not present

## 2015-12-11 DIAGNOSIS — N186 End stage renal disease: Secondary | ICD-10-CM | POA: Diagnosis not present

## 2015-12-11 DIAGNOSIS — D631 Anemia in chronic kidney disease: Secondary | ICD-10-CM | POA: Diagnosis not present

## 2015-12-14 ENCOUNTER — Inpatient Hospital Stay (INDEPENDENT_AMBULATORY_CARE_PROVIDER_SITE_OTHER): Payer: Medicare Other | Admitting: Orthopedic Surgery

## 2015-12-14 DIAGNOSIS — N2581 Secondary hyperparathyroidism of renal origin: Secondary | ICD-10-CM | POA: Diagnosis not present

## 2015-12-14 DIAGNOSIS — D631 Anemia in chronic kidney disease: Secondary | ICD-10-CM | POA: Diagnosis not present

## 2015-12-14 DIAGNOSIS — N186 End stage renal disease: Secondary | ICD-10-CM | POA: Diagnosis not present

## 2015-12-15 ENCOUNTER — Inpatient Hospital Stay (INDEPENDENT_AMBULATORY_CARE_PROVIDER_SITE_OTHER): Payer: Medicare Other | Admitting: Family

## 2015-12-15 DIAGNOSIS — Z89512 Acquired absence of left leg below knee: Secondary | ICD-10-CM | POA: Diagnosis not present

## 2015-12-15 DIAGNOSIS — M25562 Pain in left knee: Secondary | ICD-10-CM

## 2015-12-16 DIAGNOSIS — N186 End stage renal disease: Secondary | ICD-10-CM | POA: Diagnosis not present

## 2015-12-16 DIAGNOSIS — N2581 Secondary hyperparathyroidism of renal origin: Secondary | ICD-10-CM | POA: Diagnosis not present

## 2015-12-16 DIAGNOSIS — D631 Anemia in chronic kidney disease: Secondary | ICD-10-CM | POA: Diagnosis not present

## 2015-12-18 DIAGNOSIS — D631 Anemia in chronic kidney disease: Secondary | ICD-10-CM | POA: Diagnosis not present

## 2015-12-18 DIAGNOSIS — N2581 Secondary hyperparathyroidism of renal origin: Secondary | ICD-10-CM | POA: Diagnosis not present

## 2015-12-18 DIAGNOSIS — N186 End stage renal disease: Secondary | ICD-10-CM | POA: Diagnosis not present

## 2015-12-21 DIAGNOSIS — N2581 Secondary hyperparathyroidism of renal origin: Secondary | ICD-10-CM | POA: Diagnosis not present

## 2015-12-21 DIAGNOSIS — D631 Anemia in chronic kidney disease: Secondary | ICD-10-CM | POA: Diagnosis not present

## 2015-12-21 DIAGNOSIS — N186 End stage renal disease: Secondary | ICD-10-CM | POA: Diagnosis not present

## 2015-12-23 DIAGNOSIS — D631 Anemia in chronic kidney disease: Secondary | ICD-10-CM | POA: Diagnosis not present

## 2015-12-23 DIAGNOSIS — N186 End stage renal disease: Secondary | ICD-10-CM | POA: Diagnosis not present

## 2015-12-23 DIAGNOSIS — N2581 Secondary hyperparathyroidism of renal origin: Secondary | ICD-10-CM | POA: Diagnosis not present

## 2015-12-25 ENCOUNTER — Other Ambulatory Visit: Payer: Self-pay | Admitting: Physician Assistant

## 2015-12-25 DIAGNOSIS — N2581 Secondary hyperparathyroidism of renal origin: Secondary | ICD-10-CM | POA: Diagnosis not present

## 2015-12-25 DIAGNOSIS — D631 Anemia in chronic kidney disease: Secondary | ICD-10-CM | POA: Diagnosis not present

## 2015-12-25 DIAGNOSIS — N186 End stage renal disease: Secondary | ICD-10-CM | POA: Diagnosis not present

## 2015-12-28 DIAGNOSIS — N186 End stage renal disease: Secondary | ICD-10-CM | POA: Diagnosis not present

## 2015-12-28 DIAGNOSIS — N2581 Secondary hyperparathyroidism of renal origin: Secondary | ICD-10-CM | POA: Diagnosis not present

## 2015-12-28 DIAGNOSIS — D631 Anemia in chronic kidney disease: Secondary | ICD-10-CM | POA: Diagnosis not present

## 2015-12-29 DIAGNOSIS — D631 Anemia in chronic kidney disease: Secondary | ICD-10-CM | POA: Diagnosis not present

## 2015-12-29 DIAGNOSIS — N186 End stage renal disease: Secondary | ICD-10-CM | POA: Diagnosis not present

## 2015-12-29 DIAGNOSIS — I10 Essential (primary) hypertension: Secondary | ICD-10-CM | POA: Diagnosis not present

## 2015-12-30 DIAGNOSIS — K769 Liver disease, unspecified: Secondary | ICD-10-CM | POA: Diagnosis not present

## 2015-12-30 DIAGNOSIS — N186 End stage renal disease: Secondary | ICD-10-CM | POA: Diagnosis not present

## 2015-12-30 DIAGNOSIS — E8779 Other fluid overload: Secondary | ICD-10-CM | POA: Diagnosis not present

## 2015-12-30 DIAGNOSIS — D631 Anemia in chronic kidney disease: Secondary | ICD-10-CM | POA: Diagnosis not present

## 2015-12-30 DIAGNOSIS — E1165 Type 2 diabetes mellitus with hyperglycemia: Secondary | ICD-10-CM | POA: Diagnosis not present

## 2016-01-01 DIAGNOSIS — D631 Anemia in chronic kidney disease: Secondary | ICD-10-CM | POA: Diagnosis not present

## 2016-01-01 DIAGNOSIS — E8779 Other fluid overload: Secondary | ICD-10-CM | POA: Diagnosis not present

## 2016-01-01 DIAGNOSIS — N186 End stage renal disease: Secondary | ICD-10-CM | POA: Diagnosis not present

## 2016-01-03 ENCOUNTER — Telehealth: Payer: Self-pay | Admitting: Physician Assistant

## 2016-01-03 NOTE — Telephone Encounter (Signed)
Patient fell and would like to see a doctor regarding his rehab from falling and follow up from the ED patient can only schedule after the 13th, [name] will no longer be in [facility]. He would like to know Cody's recommendation on who to follow up with. Please advise.   Phone: [phone] or [phone]

## 2016-01-04 DIAGNOSIS — D631 Anemia in chronic kidney disease: Secondary | ICD-10-CM | POA: Diagnosis not present

## 2016-01-04 DIAGNOSIS — N186 End stage renal disease: Secondary | ICD-10-CM | POA: Diagnosis not present

## 2016-01-04 DIAGNOSIS — E8779 Other fluid overload: Secondary | ICD-10-CM | POA: Diagnosis not present

## 2016-01-04 NOTE — Telephone Encounter (Signed)
Called and spoke with patient regarding his healthcare management that [facility] was to take over in July; pt states that he never got this set up d/t his wife needing to finish the paperwork. Patient had fall with hypotension and injury to Left hip & leg. He cannot come in until 01/14/16 d/t transportation issues; hospital f/u scheduled with Dr. Etter Sjogren on Fri, 01/14/16 at 11:15am/SLS 11/07

## 2016-01-05 DIAGNOSIS — D631 Anemia in chronic kidney disease: Secondary | ICD-10-CM | POA: Diagnosis not present

## 2016-01-05 DIAGNOSIS — E8779 Other fluid overload: Secondary | ICD-10-CM | POA: Diagnosis not present

## 2016-01-05 DIAGNOSIS — N186 End stage renal disease: Secondary | ICD-10-CM | POA: Diagnosis not present

## 2016-01-06 DIAGNOSIS — E8779 Other fluid overload: Secondary | ICD-10-CM | POA: Diagnosis not present

## 2016-01-06 DIAGNOSIS — D631 Anemia in chronic kidney disease: Secondary | ICD-10-CM | POA: Diagnosis not present

## 2016-01-06 DIAGNOSIS — N186 End stage renal disease: Secondary | ICD-10-CM | POA: Diagnosis not present

## 2016-01-08 DIAGNOSIS — N186 End stage renal disease: Secondary | ICD-10-CM | POA: Diagnosis not present

## 2016-01-08 DIAGNOSIS — Z992 Dependence on renal dialysis: Secondary | ICD-10-CM | POA: Diagnosis not present

## 2016-01-08 DIAGNOSIS — N2581 Secondary hyperparathyroidism of renal origin: Secondary | ICD-10-CM | POA: Diagnosis not present

## 2016-01-10 ENCOUNTER — Other Ambulatory Visit: Payer: Self-pay | Admitting: *Deleted

## 2016-01-10 MED ORDER — LEVOTHYROXINE SODIUM 200 MCG PO TABS: 200.0000 ug | ORAL_TABLET | Freq: Every morning | ORAL | 0 refills | Status: AC

## 2016-01-10 NOTE — Progress Notes (Signed)
Fax received with Rx refill request from [facility]; patient has hospital f/u scheduled with Dr. Etter Sjogren on 01/14/16, Rx request to pharmacy/SLS 11/13

## 2016-01-11 DIAGNOSIS — E11628 Type 2 diabetes mellitus with other skin complications: Secondary | ICD-10-CM | POA: Diagnosis not present

## 2016-01-11 DIAGNOSIS — D500 Iron deficiency anemia secondary to blood loss (chronic): Secondary | ICD-10-CM | POA: Diagnosis not present

## 2016-01-11 DIAGNOSIS — Z6837 Body mass index (BMI) 37.0-37.9, adult: Secondary | ICD-10-CM | POA: Diagnosis not present

## 2016-01-11 DIAGNOSIS — K921 Melena: Secondary | ICD-10-CM | POA: Diagnosis not present

## 2016-01-11 DIAGNOSIS — Z0389 Encounter for observation for other suspected diseases and conditions ruled out: Secondary | ICD-10-CM | POA: Diagnosis not present

## 2016-01-11 DIAGNOSIS — D696 Thrombocytopenia, unspecified: Secondary | ICD-10-CM | POA: Diagnosis not present

## 2016-01-11 DIAGNOSIS — N390 Urinary tract infection, site not specified: Secondary | ICD-10-CM | POA: Diagnosis not present

## 2016-01-11 DIAGNOSIS — Z794 Long term (current) use of insulin: Secondary | ICD-10-CM | POA: Diagnosis not present

## 2016-01-11 DIAGNOSIS — R195 Other fecal abnormalities: Secondary | ICD-10-CM | POA: Diagnosis not present

## 2016-01-11 DIAGNOSIS — N189 Chronic kidney disease, unspecified: Secondary | ICD-10-CM | POA: Diagnosis not present

## 2016-01-11 DIAGNOSIS — Z89519 Acquired absence of unspecified leg below knee: Secondary | ICD-10-CM | POA: Diagnosis not present

## 2016-01-11 DIAGNOSIS — Z992 Dependence on renal dialysis: Secondary | ICD-10-CM | POA: Diagnosis not present

## 2016-01-11 DIAGNOSIS — N132 Hydronephrosis with renal and ureteral calculous obstruction: Secondary | ICD-10-CM | POA: Diagnosis not present

## 2016-01-11 DIAGNOSIS — D649 Anemia, unspecified: Secondary | ICD-10-CM | POA: Diagnosis not present

## 2016-01-11 DIAGNOSIS — D62 Acute posthemorrhagic anemia: Secondary | ICD-10-CM | POA: Diagnosis not present

## 2016-01-11 DIAGNOSIS — N186 End stage renal disease: Secondary | ICD-10-CM | POA: Diagnosis present

## 2016-01-11 DIAGNOSIS — K254 Chronic or unspecified gastric ulcer with hemorrhage: Secondary | ICD-10-CM | POA: Diagnosis present

## 2016-01-11 DIAGNOSIS — L89310 Pressure ulcer of right buttock, unstageable: Secondary | ICD-10-CM | POA: Diagnosis present

## 2016-01-11 DIAGNOSIS — K922 Gastrointestinal hemorrhage, unspecified: Secondary | ICD-10-CM | POA: Diagnosis not present

## 2016-01-11 DIAGNOSIS — L98499 Non-pressure chronic ulcer of skin of other sites with unspecified severity: Secondary | ICD-10-CM | POA: Diagnosis not present

## 2016-01-11 DIAGNOSIS — R531 Weakness: Secondary | ICD-10-CM | POA: Diagnosis not present

## 2016-01-11 DIAGNOSIS — K2970 Gastritis, unspecified, without bleeding: Secondary | ICD-10-CM | POA: Diagnosis present

## 2016-01-11 DIAGNOSIS — K259 Gastric ulcer, unspecified as acute or chronic, without hemorrhage or perforation: Secondary | ICD-10-CM | POA: Diagnosis not present

## 2016-01-11 DIAGNOSIS — D61818 Other pancytopenia: Secondary | ICD-10-CM | POA: Diagnosis not present

## 2016-01-11 DIAGNOSIS — I959 Hypotension, unspecified: Secondary | ICD-10-CM | POA: Diagnosis present

## 2016-01-11 DIAGNOSIS — E039 Hypothyroidism, unspecified: Secondary | ICD-10-CM | POA: Diagnosis present

## 2016-01-11 DIAGNOSIS — K2950 Unspecified chronic gastritis without bleeding: Secondary | ICD-10-CM | POA: Diagnosis not present

## 2016-01-11 DIAGNOSIS — K317 Polyp of stomach and duodenum: Secondary | ICD-10-CM | POA: Diagnosis not present

## 2016-01-11 DIAGNOSIS — R404 Transient alteration of awareness: Secondary | ICD-10-CM | POA: Diagnosis not present

## 2016-01-11 DIAGNOSIS — I10 Essential (primary) hypertension: Secondary | ICD-10-CM | POA: Diagnosis not present

## 2016-01-11 DIAGNOSIS — E1122 Type 2 diabetes mellitus with diabetic chronic kidney disease: Secondary | ICD-10-CM | POA: Diagnosis present

## 2016-01-11 DIAGNOSIS — D72819 Decreased white blood cell count, unspecified: Secondary | ICD-10-CM | POA: Diagnosis not present

## 2016-01-11 DIAGNOSIS — Z6838 Body mass index (BMI) 38.0-38.9, adult: Secondary | ICD-10-CM | POA: Diagnosis not present

## 2016-01-11 DIAGNOSIS — N185 Chronic kidney disease, stage 5: Secondary | ICD-10-CM | POA: Diagnosis not present

## 2016-01-11 DIAGNOSIS — Z86718 Personal history of other venous thrombosis and embolism: Secondary | ICD-10-CM | POA: Diagnosis not present

## 2016-01-11 DIAGNOSIS — D631 Anemia in chronic kidney disease: Secondary | ICD-10-CM | POA: Diagnosis not present

## 2016-01-11 DIAGNOSIS — E11622 Type 2 diabetes mellitus with other skin ulcer: Secondary | ICD-10-CM | POA: Diagnosis not present

## 2016-01-11 DIAGNOSIS — M1990 Unspecified osteoarthritis, unspecified site: Secondary | ICD-10-CM | POA: Diagnosis present

## 2016-01-11 DIAGNOSIS — I120 Hypertensive chronic kidney disease with stage 5 chronic kidney disease or end stage renal disease: Secondary | ICD-10-CM | POA: Diagnosis present

## 2016-01-14 ENCOUNTER — Inpatient Hospital Stay: Payer: Medicare Other | Admitting: Family Medicine

## 2016-01-14 DIAGNOSIS — Z0289 Encounter for other administrative examinations: Secondary | ICD-10-CM

## 2016-01-15 DIAGNOSIS — L89310 Pressure ulcer of right buttock, unstageable: Secondary | ICD-10-CM | POA: Diagnosis not present

## 2016-01-15 DIAGNOSIS — E11622 Type 2 diabetes mellitus with other skin ulcer: Secondary | ICD-10-CM | POA: Diagnosis not present

## 2016-01-15 DIAGNOSIS — D62 Acute posthemorrhagic anemia: Secondary | ICD-10-CM | POA: Diagnosis not present

## 2016-01-15 DIAGNOSIS — I120 Hypertensive chronic kidney disease with stage 5 chronic kidney disease or end stage renal disease: Secondary | ICD-10-CM | POA: Diagnosis not present

## 2016-01-15 DIAGNOSIS — E8779 Other fluid overload: Secondary | ICD-10-CM | POA: Diagnosis not present

## 2016-01-15 DIAGNOSIS — Z6838 Body mass index (BMI) 38.0-38.9, adult: Secondary | ICD-10-CM | POA: Diagnosis not present

## 2016-01-15 DIAGNOSIS — K254 Chronic or unspecified gastric ulcer with hemorrhage: Secondary | ICD-10-CM | POA: Diagnosis not present

## 2016-01-15 DIAGNOSIS — Z794 Long term (current) use of insulin: Secondary | ICD-10-CM | POA: Diagnosis not present

## 2016-01-15 DIAGNOSIS — D631 Anemia in chronic kidney disease: Secondary | ICD-10-CM | POA: Diagnosis not present

## 2016-01-15 DIAGNOSIS — Z760 Encounter for issue of repeat prescription: Secondary | ICD-10-CM | POA: Diagnosis not present

## 2016-01-15 DIAGNOSIS — D61818 Other pancytopenia: Secondary | ICD-10-CM | POA: Diagnosis not present

## 2016-01-15 DIAGNOSIS — L98499 Non-pressure chronic ulcer of skin of other sites with unspecified severity: Secondary | ICD-10-CM | POA: Diagnosis not present

## 2016-01-15 DIAGNOSIS — N390 Urinary tract infection, site not specified: Secondary | ICD-10-CM | POA: Diagnosis not present

## 2016-01-15 DIAGNOSIS — E119 Type 2 diabetes mellitus without complications: Secondary | ICD-10-CM | POA: Diagnosis not present

## 2016-01-15 DIAGNOSIS — R531 Weakness: Secondary | ICD-10-CM | POA: Diagnosis not present

## 2016-01-15 DIAGNOSIS — D649 Anemia, unspecified: Secondary | ICD-10-CM | POA: Diagnosis not present

## 2016-01-15 DIAGNOSIS — E11620 Type 2 diabetes mellitus with diabetic dermatitis: Secondary | ICD-10-CM | POA: Diagnosis not present

## 2016-01-15 DIAGNOSIS — K922 Gastrointestinal hemorrhage, unspecified: Secondary | ICD-10-CM | POA: Diagnosis not present

## 2016-01-15 DIAGNOSIS — N186 End stage renal disease: Secondary | ICD-10-CM | POA: Diagnosis not present

## 2016-01-15 DIAGNOSIS — E11628 Type 2 diabetes mellitus with other skin complications: Secondary | ICD-10-CM | POA: Diagnosis not present

## 2016-01-15 DIAGNOSIS — I10 Essential (primary) hypertension: Secondary | ICD-10-CM | POA: Diagnosis not present

## 2016-01-15 DIAGNOSIS — Z992 Dependence on renal dialysis: Secondary | ICD-10-CM | POA: Diagnosis not present

## 2016-01-17 ENCOUNTER — Ambulatory Visit (INDEPENDENT_AMBULATORY_CARE_PROVIDER_SITE_OTHER): Payer: Medicare Other | Admitting: Orthopedic Surgery

## 2016-01-17 DIAGNOSIS — N186 End stage renal disease: Secondary | ICD-10-CM | POA: Diagnosis not present

## 2016-01-17 DIAGNOSIS — D631 Anemia in chronic kidney disease: Secondary | ICD-10-CM | POA: Diagnosis not present

## 2016-01-17 DIAGNOSIS — E8779 Other fluid overload: Secondary | ICD-10-CM | POA: Diagnosis not present

## 2016-01-18 DIAGNOSIS — N186 End stage renal disease: Secondary | ICD-10-CM | POA: Diagnosis not present

## 2016-01-18 DIAGNOSIS — D631 Anemia in chronic kidney disease: Secondary | ICD-10-CM | POA: Diagnosis not present

## 2016-01-18 DIAGNOSIS — E8779 Other fluid overload: Secondary | ICD-10-CM | POA: Diagnosis not present

## 2016-01-19 DIAGNOSIS — L89310 Pressure ulcer of right buttock, unstageable: Secondary | ICD-10-CM | POA: Diagnosis not present

## 2016-01-19 DIAGNOSIS — E11620 Type 2 diabetes mellitus with diabetic dermatitis: Secondary | ICD-10-CM | POA: Diagnosis not present

## 2016-01-19 DIAGNOSIS — D631 Anemia in chronic kidney disease: Secondary | ICD-10-CM | POA: Diagnosis not present

## 2016-01-19 DIAGNOSIS — N186 End stage renal disease: Secondary | ICD-10-CM | POA: Diagnosis not present

## 2016-01-19 DIAGNOSIS — D62 Acute posthemorrhagic anemia: Secondary | ICD-10-CM | POA: Diagnosis not present

## 2016-01-19 DIAGNOSIS — I10 Essential (primary) hypertension: Secondary | ICD-10-CM | POA: Diagnosis not present

## 2016-01-19 DIAGNOSIS — E8779 Other fluid overload: Secondary | ICD-10-CM | POA: Diagnosis not present

## 2016-01-21 DIAGNOSIS — I10 Essential (primary) hypertension: Secondary | ICD-10-CM | POA: Diagnosis not present

## 2016-01-21 DIAGNOSIS — E119 Type 2 diabetes mellitus without complications: Secondary | ICD-10-CM | POA: Diagnosis not present

## 2016-01-21 DIAGNOSIS — Z760 Encounter for issue of repeat prescription: Secondary | ICD-10-CM | POA: Diagnosis not present

## 2016-01-21 DIAGNOSIS — N186 End stage renal disease: Secondary | ICD-10-CM | POA: Diagnosis not present

## 2016-01-22 DIAGNOSIS — N186 End stage renal disease: Secondary | ICD-10-CM | POA: Diagnosis not present

## 2016-01-22 DIAGNOSIS — E8779 Other fluid overload: Secondary | ICD-10-CM | POA: Diagnosis not present

## 2016-01-22 DIAGNOSIS — D631 Anemia in chronic kidney disease: Secondary | ICD-10-CM | POA: Diagnosis not present

## 2016-01-25 DIAGNOSIS — N186 End stage renal disease: Secondary | ICD-10-CM | POA: Diagnosis not present

## 2016-01-25 DIAGNOSIS — D631 Anemia in chronic kidney disease: Secondary | ICD-10-CM | POA: Diagnosis not present

## 2016-01-25 DIAGNOSIS — E8779 Other fluid overload: Secondary | ICD-10-CM | POA: Diagnosis not present

## 2016-01-27 DIAGNOSIS — N186 End stage renal disease: Secondary | ICD-10-CM | POA: Diagnosis not present

## 2016-01-27 DIAGNOSIS — E8779 Other fluid overload: Secondary | ICD-10-CM | POA: Diagnosis not present

## 2016-01-27 DIAGNOSIS — D631 Anemia in chronic kidney disease: Secondary | ICD-10-CM | POA: Diagnosis not present

## 2016-01-28 DIAGNOSIS — I10 Essential (primary) hypertension: Secondary | ICD-10-CM | POA: Diagnosis not present

## 2016-01-28 DIAGNOSIS — D631 Anemia in chronic kidney disease: Secondary | ICD-10-CM | POA: Diagnosis not present

## 2016-01-28 DIAGNOSIS — N186 End stage renal disease: Secondary | ICD-10-CM | POA: Diagnosis not present

## 2016-01-29 DIAGNOSIS — N186 End stage renal disease: Secondary | ICD-10-CM | POA: Diagnosis not present

## 2016-01-29 DIAGNOSIS — D631 Anemia in chronic kidney disease: Secondary | ICD-10-CM | POA: Diagnosis not present

## 2016-02-01 DIAGNOSIS — D631 Anemia in chronic kidney disease: Secondary | ICD-10-CM | POA: Diagnosis not present

## 2016-02-01 DIAGNOSIS — N186 End stage renal disease: Secondary | ICD-10-CM | POA: Diagnosis not present

## 2016-02-03 DIAGNOSIS — E1165 Type 2 diabetes mellitus with hyperglycemia: Secondary | ICD-10-CM | POA: Diagnosis not present

## 2016-02-03 DIAGNOSIS — N186 End stage renal disease: Secondary | ICD-10-CM | POA: Diagnosis not present

## 2016-02-03 DIAGNOSIS — D631 Anemia in chronic kidney disease: Secondary | ICD-10-CM | POA: Diagnosis not present

## 2016-02-03 DIAGNOSIS — K769 Liver disease, unspecified: Secondary | ICD-10-CM | POA: Diagnosis not present

## 2016-02-04 ENCOUNTER — Telehealth: Payer: Self-pay | Admitting: Physician Assistant

## 2016-02-04 DIAGNOSIS — N186 End stage renal disease: Secondary | ICD-10-CM | POA: Diagnosis not present

## 2016-02-04 DIAGNOSIS — I120 Hypertensive chronic kidney disease with stage 5 chronic kidney disease or end stage renal disease: Secondary | ICD-10-CM | POA: Diagnosis not present

## 2016-02-04 DIAGNOSIS — E1122 Type 2 diabetes mellitus with diabetic chronic kidney disease: Secondary | ICD-10-CM | POA: Diagnosis not present

## 2016-02-04 DIAGNOSIS — E1140 Type 2 diabetes mellitus with diabetic neuropathy, unspecified: Secondary | ICD-10-CM | POA: Diagnosis not present

## 2016-02-04 DIAGNOSIS — R531 Weakness: Secondary | ICD-10-CM | POA: Diagnosis not present

## 2016-02-04 DIAGNOSIS — I872 Venous insufficiency (chronic) (peripheral): Secondary | ICD-10-CM | POA: Diagnosis not present

## 2016-02-04 NOTE — Telephone Encounter (Signed)
FYI, should we try another agency?

## 2016-02-04 NOTE — Telephone Encounter (Signed)
LMOVM for annotation below

## 2016-02-04 NOTE — Telephone Encounter (Signed)
Please call patient. Received note regarding OT. They will be coming out in a few weeks.  Patient was previously going to be under [facility] care. He should be following up with them regarding health care needs. If he is not seeing them then he needs to schedule a follow-up with [place] to continue his medical care as he is overdue for follow-up if not seeing another primary care provider or [facility].

## 2016-02-04 NOTE — Telephone Encounter (Signed)
Caller name: Horris Latino Relationship to patient: [facility] Can be reached: [phone]    Reason for call: FYI: [facility] called to inform provider that OT evaluation on patient will not be done until 12/20 dur to limited staffing.

## 2016-02-05 DIAGNOSIS — D631 Anemia in chronic kidney disease: Secondary | ICD-10-CM | POA: Diagnosis not present

## 2016-02-05 DIAGNOSIS — N186 End stage renal disease: Secondary | ICD-10-CM | POA: Diagnosis not present

## 2016-02-07 ENCOUNTER — Other Ambulatory Visit: Payer: Self-pay | Admitting: Physician Assistant

## 2016-02-07 ENCOUNTER — Ambulatory Visit (INDEPENDENT_AMBULATORY_CARE_PROVIDER_SITE_OTHER): Payer: Medicare Other | Admitting: Orthopedic Surgery

## 2016-02-07 ENCOUNTER — Telehealth: Payer: Self-pay | Admitting: Physician Assistant

## 2016-02-07 DIAGNOSIS — L97221 Non-pressure chronic ulcer of left calf limited to breakdown of skin: Secondary | ICD-10-CM

## 2016-02-07 DIAGNOSIS — Z89519 Acquired absence of unspecified leg below knee: Secondary | ICD-10-CM | POA: Diagnosis not present

## 2016-02-07 DIAGNOSIS — E1121 Type 2 diabetes mellitus with diabetic nephropathy: Secondary | ICD-10-CM | POA: Diagnosis not present

## 2016-02-07 DIAGNOSIS — Z794 Long term (current) use of insulin: Secondary | ICD-10-CM | POA: Diagnosis not present

## 2016-02-07 HISTORY — DX: Non-pressure chronic ulcer of left calf limited to breakdown of skin: L97.221

## 2016-02-07 NOTE — Telephone Encounter (Signed)
Spoke with patient, he states he was admitted to [facility] for bleeding colon polyp. He states his blood pressure was fluctuating and was put on medication to take tid (he did not have the medication with him to look at the name), was given blood transfusion. He was sent to [facility] for 2 weeks for therapy. He states his blood sugars are running 100-130 FBS, and 180 > at bedtime. He states he is taking Levemir 40 units at bedtime and Novolog for sliding scale (samles given from nurse).  I did contact patient pharmacy and his Nancee Liter was filled 09/23/15 and Lantus was last filled in May. Both sent in under [facility] office. Reminded patient to keep his appt with Dr Nani Ravens on 02/09/16.

## 2016-02-07 NOTE — Telephone Encounter (Signed)
Caller name: Olin Hauser with [facility] Can be reached: [phone]  Reason for call: Pt case was opened on Friday 12/8 for [facility]. Olin Hauser is requesting VO to provide med mgmt 1 week 9 & wound mgmt L stump 3 PRN visits (wife has done WC before)

## 2016-02-07 NOTE — Progress Notes (Signed)
Wound Care Note   Patient: [name]           Date of Birth: [birth year]           MRN: [id number]             PCP: Leeanne Rio, PA-C Visit Date: 02/07/2016   Assessment & Plan: Visit Diagnoses:  1. S/P BKA (below knee amputation) unilateral ([place])   2. Type 2 diabetes mellitus with diabetic nephropathy, with long-term current use of insulin ([place])   3. Non-pressure chronic ulcer of left calf, limited to breakdown of skin ([place])     Plan: Patient is given a prescription for [employer] for a new stump shrinker new socket and new liner. Patient's ulcer is approximately 25 mm in diameter 0.1 mm deep with beefy granulation tissue there is no synovitis no drainage no signs of infection. Plan continue with antibiotic ointment and dressing changes daily   Follow-Up Instructions: Return in about 4 weeks (around 03/06/2016).  Orders:  No orders of the defined types were placed in this encounter.  No orders of the defined types were placed in this encounter.     Procedures: No notes on file   Clinical Data: No additional findings.   No images are attached to the encounter.   Subjective: Chief Complaint  Patient presents with  . Left Leg - Open Wound    Patient is a 60 year old gentleman with a history of a left below the knee amputation. States was recently hospitalized during hospitalization his shrinker was lost. States while hospitalized was notified that he had an ulcer to his left below the knee amputation. Does have purulent drainage today from the wound. Dressing with dry dressings.  Patient states that he has recently been in [facility] he has had a bladder resection with Dr. Alona Bene he has had an upper endoscopy with bleeding ulcer.  Review of Systems    Objective: Vital Signs: There were no vitals taken for this visit.  Physical Exam: On examination patient is alert oriented no adenopathy well-dressed normal affect and wrist whenever he is  ambulating in a wheelchair. Examination he has brawny skin color changes with lymphedema. He has an ulcer anterior laterally which measures 25 mm diameter 0.1 mm deep this has good beefy granulation tissue there is no odor no drainage no signs of infection.  Specialty Comments: No specialty comments available.   PMFS History: Patient Active Problem List   Diagnosis Date Noted  . Non-pressure chronic ulcer of left calf, limited to breakdown of skin ([place]) 02/07/2016  . Hypotension, unspecified 11/24/2015  . Left knee pain 11/24/2015  . Hypotension 11/24/2015  . Decubitus ulcer of buttock 01/22/2015  . S/P BKA (below knee amputation) unilateral ([place]) 01/22/2015  . Chronic venous insufficiency 09/08/2014  . Diabetes mellitus, type 2 ([place]) 09/08/2014  . ESRD on dialysis ([place]) 09/08/2014  . Hyperlipidemia 12/23/2013  . Diabetic foot ([place]) 11/20/2013  . Chronic pain 05/21/2013  . Amputated below knee ([place]) 05/17/2013  . Chronic interstitial cystitis 02/06/2013  . UI (urinary incontinence) 10/29/2012  . CKD (chronic kidney disease) stage 3, GFR 30-59 ml/min 08/18/2012  . Hypothyroidism, acquired 08/04/2012  . OBESITY 09/21/2009  . Essential hypertension 09/21/2009  . DIVERTICULOSIS OF COLON 09/21/2009  . Type 2 diabetes mellitus with diabetic nephropathy, with long-term current use of insulin ([place]) 10/01/2007  . DVT 10/01/2007   Past Medical History:  Diagnosis Date  . Acute respiratory failure ([place])   . Anemia   .  Anxiety   . AR (allergic rhinitis)   . Arthritis    osteoarthritis  . Blood transfusion   . BPH (benign prostatic hypertrophy)   . Cellulitis and abscess of leg   . Cholelithiasis   . Chronic kidney disease    hx of kidney stones  . Chronic kidney disease, stage III (moderate)   . Chronic pain   . Diabetes mellitus    insulin dependent  . Displacement of lumbar intervertebral disc without myelopathy    L4-L5  . Diverticulosis of colon   . DVT (deep venous  thrombosis) (HCC)    Lower Extremity  . Embolism and thrombosis of unspecified artery ([place])   . Gallstone   . History of nephrolithiasis    Bilateral  . Hyperlipidemia   . Hypertension   . Hypertrophy of prostate   . Hypothyroidism   . Impotence of organic origin   . Morbid obesity ([place])   . Nephrolithiasis   . Other lymphedema   . Pancreatitis, acute   . Peripheral vascular disease ([place])   . Personal history of arthritis    Osteoarthritis  . Pneumonia   . Sleep apnea    uses cpap  . Unspecified septicemia ([place])     Family History  Problem Relation Age of Onset  . Diabetes Mother   . Heart attack Mother   . Stroke Mother   . Colon cancer Father   . Dementia Father   . Heart attack Father   . Arthritis Paternal Grandmother   . Hypertension Other   . Hypothyroidism Other    Past Surgical History:  Procedure Laterality Date  . AMPUTATION  02/07/2011   Procedure: AMPUTATION BELOW KNEE;  Surgeon: Newt Minion, MD;  Location: [place];  Service: Orthopedics;  Laterality: Left;  Left Below Knee Amputation  . bilteral hip     replacement & revison  's   . CYSTOSCOPY W/ RETROGRADES Bilateral 10/24/2012   Procedure: CYSTOSCOPY WITH RETROGRADE PYELOGRAM Procedure: Cystoscopy, Bilateral Retrogarde Pyelograms, Bladder Biopsy, Hydrodistension;  Surgeon: Franchot Gallo, MD;  Location: [facility];  Service: Urology;  Laterality: Bilateral;  . KIDNEY SURGERY    . KNEE SURGERY     left knee    . LEG AMPUTATION BELOW KNEE    . REPLACEMENT TOTAL KNEE    . TONSILLECTOMY     Social History   Occupational History  . Not on file.   Social History Main Topics  . Smoking status: Never Smoker  . Smokeless tobacco: Never Used  . Alcohol use No  . Drug use: No  . Sexual activity: Yes

## 2016-02-07 NOTE — Telephone Encounter (Signed)
Spoke with [facility] representative. HH ordered after discharge from [facility]. Gave verbal orders for PT/OT and wound care. Patient is scheduled for appointment with Dr. Nani Ravens on 02/09/16 to transfer care and follow-up from rehabilitation. Will defer further orders for [facility] to him.   Also reviewed patient's medication list.  He is overdue for TSH check. Spoke with Dr. Nani Ravens who will check this at visit.

## 2016-02-08 DIAGNOSIS — N186 End stage renal disease: Secondary | ICD-10-CM | POA: Diagnosis not present

## 2016-02-08 DIAGNOSIS — D631 Anemia in chronic kidney disease: Secondary | ICD-10-CM | POA: Diagnosis not present

## 2016-02-09 ENCOUNTER — Ambulatory Visit (INDEPENDENT_AMBULATORY_CARE_PROVIDER_SITE_OTHER): Payer: Medicare Other | Admitting: Family Medicine

## 2016-02-09 ENCOUNTER — Telehealth: Payer: Self-pay | Admitting: Family Medicine

## 2016-02-09 ENCOUNTER — Encounter: Payer: Self-pay | Admitting: Family Medicine

## 2016-02-09 VITALS — BP 102/58 | HR 84 | Temp 97.6°F

## 2016-02-09 DIAGNOSIS — Z09 Encounter for follow-up examination after completed treatment for conditions other than malignant neoplasm: Secondary | ICD-10-CM | POA: Diagnosis not present

## 2016-02-09 MED ORDER — MIDODRINE HCL 5 MG PO TABS: 5.0000 mg | ORAL_TABLET | Freq: Three times a day (TID) | ORAL | 2 refills | Status: AC

## 2016-02-09 NOTE — Telephone Encounter (Signed)
Can we get in touch with the clinic that does his HD and request they draw a CBC, TSH, and free T4 please? Thanks.

## 2016-02-09 NOTE — Progress Notes (Signed)
Chief Complaint  Patient presents with  . Hospitalization Follow-up    Subjective: Patient is a 60 y.o. male here for rehab f/u.  D/C'd from [facility] around 3 weeks ago for Anemia and GI bleed. A bleeding polyp was found during his admission. Had issues with BP being low and was D/C'd to rehab. He was given Midodrine in the hospital. This helped with his BP's and he was He got out of rehab one week ago today. He is feeling fine today and has no complaints.  He also has a history of hypothyroidism. His thyroid level has not been checked in many months. He declines having labs drawn today as he would rather have it done during HD.  ROS: Heart: Denies chest pain  Lungs: Denies SOB  Family History  Problem Relation Age of Onset  . Diabetes Mother   . Heart attack Mother   . Stroke Mother   . Colon cancer Father   . Dementia Father   . Heart attack Father   . Arthritis Paternal Grandmother   . Hypertension Other   . Hypothyroidism Other    Past Medical History:  Diagnosis Date  . Acute respiratory failure ([place])   . Anemia   . Anxiety   . AR (allergic rhinitis)   . Arthritis    osteoarthritis  . Blood transfusion   . BPH (benign prostatic hypertrophy)   . Cellulitis and abscess of leg   . Cholelithiasis   . Chronic kidney disease    hx of kidney stones  . Chronic kidney disease, stage III (moderate)   . Chronic pain   . Diabetes mellitus    insulin dependent  . Displacement of lumbar intervertebral disc without myelopathy    L4-L5  . Diverticulosis of colon   . DVT (deep venous thrombosis) (HCC)    Lower Extremity  . Embolism and thrombosis of unspecified artery   . Gallstone   . History of nephrolithiasis    Bilateral  . Hyperlipidemia   . Hypertension   . Hypertrophy of prostate   . Hypothyroidism   . Impotence of organic origin   . Morbid obesity ([place])   . Nephrolithiasis   . Other lymphedema   . Pancreatitis, acute   . Peripheral  vascular disease ([place])   . Personal history of arthritis    Osteoarthritis  . Pneumonia   . Sleep apnea    uses cpap  . Unspecified septicemia(038.9) ([place])    No Known Allergies  Current Outpatient Prescriptions:  .  Ascorbic Acid (VITAMIN C PO), Take 1 tablet by mouth 2 (two) times daily., Disp: , Rfl:  .  atorvastatin (LIPITOR) 40 MG tablet, TAKE 1 TABLET (40 MG TOTAL) BY MOUTH DAILY., Disp: 90 tablet, Rfl: 1 .  ferrous sulfate 325 (65 FE) MG tablet, Take 1 tablet (325 mg total) by mouth 2 (two) times daily., Disp: 180 tablet, Rfl: 1 .  gabapentin (NEURONTIN) 100 MG capsule, TAKE 1 CAPSULE (100 MG TOTAL) BY MOUTH 3 (THREE) TIMES DAILY., Disp: 90 capsule, Rfl: 1 .  Insulin Glargine (BASAGLAR KWIKPEN) 100 UNIT/ML SOPN, Inject 40 Units into the skin at bedtime., Disp: , Rfl: 1 .  Insulin Syringe-Needle U-100 (B-D INS SYRINGE 0.5CC/31GX5/16) 31G X 5/16" 0.5 ML MISC, USE 4 TIMES A DAY FOR DIABETES BLOOD GLUCOSE TESTING DX: E11.29, Disp: 300 each, Rfl: 1 .  levothyroxine (SYNTHROID, LEVOTHROID) 200 MCG tablet, Take 1 tablet (200 mcg total) by mouth every morning., Disp: 90 tablet, Rfl: 0 .  levothyroxine (SYNTHROID, LEVOTHROID) 25 MCG tablet, TAKE 1 TABLET (25 MCG TOTAL) BY MOUTH DAILY BEFORE BREAKFAST., Disp: 90 tablet, Rfl: 1 .  midodrine (PROAMATINE) 5 MG tablet, Take 1 tablet (5 mg total) by mouth 3 (three) times daily., Disp: 90 tablet, Rfl: 2 .  Multiple Vitamins-Minerals (MULTIVITAMINS THER. W/MINERALS) TABS, Take 1 tablet by mouth daily. , Disp: , Rfl:  .  sevelamer carbonate (RENVELA) 800 MG tablet, Take 800 mg by mouth 3 (three) times daily with meals., Disp: , Rfl:  .  methenamine (HIPREX) 1 g tablet, Take 1 g by mouth 2 (two) times daily., Disp: , Rfl:  .  traMADol (ULTRAM) 50 MG tablet, Take 1 tablet (50 mg total) by mouth every 6 (six) hours as needed for moderate pain. (Patient not taking: Reported on 02/09/2016), Disp: 15 tablet, Rfl: 0  Objective: BP (!) 102/58 (BP Location:  Left Arm, Patient Position: Sitting, Cuff Size: Large)   Pulse 84   Temp 97.6 F (36.4 C) (Oral)   SpO2 99%  General: Awake, appears stated age HEENT: MMM, EOMi Heart: RRR, no murmurs Lungs: CTAB, no rales, wheezes or rhonchi. No accessory muscle use Psych: Age appropriate judgment and insight, normal affect and mood   Assessment and Plan: Hospital discharge follow-up - Plan: midodrine (PROAMATINE) 5 MG tablet  Orders as above. Will try to get HD to draw CBC and TSH and Free T4. F/u in 1 mo for dedicated DM visit. The patient voiced understanding and agreement to the plan.  [place], DO 02/09/16  12:06 PM

## 2016-02-10 DIAGNOSIS — N186 End stage renal disease: Secondary | ICD-10-CM | POA: Diagnosis not present

## 2016-02-10 DIAGNOSIS — D631 Anemia in chronic kidney disease: Secondary | ICD-10-CM | POA: Diagnosis not present

## 2016-02-12 DIAGNOSIS — N186 End stage renal disease: Secondary | ICD-10-CM | POA: Diagnosis not present

## 2016-02-12 DIAGNOSIS — D631 Anemia in chronic kidney disease: Secondary | ICD-10-CM | POA: Diagnosis not present

## 2016-02-14 NOTE — Telephone Encounter (Signed)
Called [facility] and spoke with Benjamine Mola and informed her of the note below.  She stated that we will need to fax over orders for the blood work to be drawn.//AB/CMA

## 2016-02-15 DIAGNOSIS — D631 Anemia in chronic kidney disease: Secondary | ICD-10-CM | POA: Diagnosis not present

## 2016-02-15 DIAGNOSIS — N186 End stage renal disease: Secondary | ICD-10-CM | POA: Diagnosis not present

## 2016-02-16 DIAGNOSIS — I872 Venous insufficiency (chronic) (peripheral): Secondary | ICD-10-CM | POA: Diagnosis not present

## 2016-02-16 DIAGNOSIS — I120 Hypertensive chronic kidney disease with stage 5 chronic kidney disease or end stage renal disease: Secondary | ICD-10-CM | POA: Diagnosis not present

## 2016-02-16 DIAGNOSIS — E1122 Type 2 diabetes mellitus with diabetic chronic kidney disease: Secondary | ICD-10-CM | POA: Diagnosis not present

## 2016-02-16 DIAGNOSIS — R531 Weakness: Secondary | ICD-10-CM | POA: Diagnosis not present

## 2016-02-16 DIAGNOSIS — N186 End stage renal disease: Secondary | ICD-10-CM | POA: Diagnosis not present

## 2016-02-16 DIAGNOSIS — E1140 Type 2 diabetes mellitus with diabetic neuropathy, unspecified: Secondary | ICD-10-CM | POA: Diagnosis not present

## 2016-02-16 NOTE — Telephone Encounter (Addendum)
Faxed orders to [facility]-Attn:Charge nurse for the blood work to be drawn on the pt-per Dr. Wendling.//AB/CMA

## 2016-02-17 DIAGNOSIS — D631 Anemia in chronic kidney disease: Secondary | ICD-10-CM | POA: Diagnosis not present

## 2016-02-17 DIAGNOSIS — N186 End stage renal disease: Secondary | ICD-10-CM | POA: Diagnosis not present

## 2016-02-17 DIAGNOSIS — E039 Hypothyroidism, unspecified: Secondary | ICD-10-CM | POA: Diagnosis not present

## 2016-02-18 ENCOUNTER — Telehealth: Payer: Self-pay | Admitting: Family Medicine

## 2016-02-18 DIAGNOSIS — E1140 Type 2 diabetes mellitus with diabetic neuropathy, unspecified: Secondary | ICD-10-CM | POA: Diagnosis not present

## 2016-02-18 DIAGNOSIS — I120 Hypertensive chronic kidney disease with stage 5 chronic kidney disease or end stage renal disease: Secondary | ICD-10-CM | POA: Diagnosis not present

## 2016-02-18 DIAGNOSIS — E1122 Type 2 diabetes mellitus with diabetic chronic kidney disease: Secondary | ICD-10-CM | POA: Diagnosis not present

## 2016-02-18 DIAGNOSIS — N186 End stage renal disease: Secondary | ICD-10-CM | POA: Diagnosis not present

## 2016-02-18 DIAGNOSIS — R531 Weakness: Secondary | ICD-10-CM | POA: Diagnosis not present

## 2016-02-18 DIAGNOSIS — I872 Venous insufficiency (chronic) (peripheral): Secondary | ICD-10-CM | POA: Diagnosis not present

## 2016-02-18 NOTE — Telephone Encounter (Signed)
Caller name: Anderson Malta Relationship to patient: [facility] Can be reached: [place]:  Reason for call: [facility] called to inform provider that patient was assessed for PT and was not admitted because patient is at baseline level of function.

## 2016-02-19 DIAGNOSIS — N186 End stage renal disease: Secondary | ICD-10-CM | POA: Diagnosis not present

## 2016-02-19 DIAGNOSIS — D631 Anemia in chronic kidney disease: Secondary | ICD-10-CM | POA: Diagnosis not present

## 2016-02-22 NOTE — Telephone Encounter (Signed)
FYI. Please advise.

## 2016-02-22 NOTE — Telephone Encounter (Signed)
OK 

## 2016-02-23 DIAGNOSIS — D631 Anemia in chronic kidney disease: Secondary | ICD-10-CM | POA: Diagnosis not present

## 2016-02-23 DIAGNOSIS — N186 End stage renal disease: Secondary | ICD-10-CM | POA: Diagnosis not present

## 2016-02-24 DIAGNOSIS — N186 End stage renal disease: Secondary | ICD-10-CM | POA: Diagnosis not present

## 2016-02-24 DIAGNOSIS — D631 Anemia in chronic kidney disease: Secondary | ICD-10-CM | POA: Diagnosis not present

## 2016-02-25 DIAGNOSIS — E1140 Type 2 diabetes mellitus with diabetic neuropathy, unspecified: Secondary | ICD-10-CM | POA: Diagnosis not present

## 2016-02-25 DIAGNOSIS — N186 End stage renal disease: Secondary | ICD-10-CM | POA: Diagnosis not present

## 2016-02-25 DIAGNOSIS — E1122 Type 2 diabetes mellitus with diabetic chronic kidney disease: Secondary | ICD-10-CM | POA: Diagnosis not present

## 2016-02-25 DIAGNOSIS — I120 Hypertensive chronic kidney disease with stage 5 chronic kidney disease or end stage renal disease: Secondary | ICD-10-CM | POA: Diagnosis not present

## 2016-02-25 DIAGNOSIS — R531 Weakness: Secondary | ICD-10-CM | POA: Diagnosis not present

## 2016-02-25 DIAGNOSIS — I872 Venous insufficiency (chronic) (peripheral): Secondary | ICD-10-CM | POA: Diagnosis not present

## 2016-02-26 DIAGNOSIS — D631 Anemia in chronic kidney disease: Secondary | ICD-10-CM | POA: Diagnosis not present

## 2016-02-26 DIAGNOSIS — N186 End stage renal disease: Secondary | ICD-10-CM | POA: Diagnosis not present

## 2016-02-27 ENCOUNTER — Other Ambulatory Visit: Payer: Self-pay | Admitting: Physician Assistant

## 2016-02-28 DIAGNOSIS — N186 End stage renal disease: Secondary | ICD-10-CM | POA: Diagnosis not present

## 2016-02-28 DIAGNOSIS — I10 Essential (primary) hypertension: Secondary | ICD-10-CM | POA: Diagnosis not present

## 2016-02-28 DIAGNOSIS — D631 Anemia in chronic kidney disease: Secondary | ICD-10-CM | POA: Diagnosis not present

## 2016-03-01 DIAGNOSIS — N186 End stage renal disease: Secondary | ICD-10-CM | POA: Diagnosis not present

## 2016-03-01 DIAGNOSIS — Z992 Dependence on renal dialysis: Secondary | ICD-10-CM | POA: Diagnosis not present

## 2016-03-03 DIAGNOSIS — K769 Liver disease, unspecified: Secondary | ICD-10-CM | POA: Diagnosis not present

## 2016-03-03 DIAGNOSIS — E039 Hypothyroidism, unspecified: Secondary | ICD-10-CM | POA: Diagnosis not present

## 2016-03-03 DIAGNOSIS — E1165 Type 2 diabetes mellitus with hyperglycemia: Secondary | ICD-10-CM | POA: Diagnosis not present

## 2016-03-04 ENCOUNTER — Other Ambulatory Visit: Payer: Self-pay | Admitting: Physician Assistant

## 2016-03-06 ENCOUNTER — Ambulatory Visit (INDEPENDENT_AMBULATORY_CARE_PROVIDER_SITE_OTHER): Payer: Medicare Other | Admitting: Orthopedic Surgery

## 2016-03-06 DIAGNOSIS — L97221 Non-pressure chronic ulcer of left calf limited to breakdown of skin: Secondary | ICD-10-CM

## 2016-03-06 DIAGNOSIS — E1121 Type 2 diabetes mellitus with diabetic nephropathy: Secondary | ICD-10-CM

## 2016-03-06 DIAGNOSIS — Z89519 Acquired absence of unspecified leg below knee: Secondary | ICD-10-CM

## 2016-03-06 DIAGNOSIS — Z794 Long term (current) use of insulin: Secondary | ICD-10-CM

## 2016-03-06 NOTE — Telephone Encounter (Signed)
I have refilled Rx for Novolog and sent to [facility] pharmacy, TL/CMA

## 2016-03-06 NOTE — Progress Notes (Signed)
Office Visit Note   Patient: [name]           Date of Birth: 17-Nov-1955           MRN: [id number] Visit Date: 03/06/2016              Requested by: Brunetta Jeans, PA-C [address] [place], [place] [address] PCP: Shelda Pal, DO  Chief Complaint  Patient presents with  . Left Leg - Wound Check    HPI: The patient is a 61 year old gentleman who is seen today in follow-up for ulcerations to his left below the knee amputation. He has been nonweightbearing in a wheelchair while these ulcers healed. Has been dressing with alleven foam, no ointment. No concerns today. Does wonder when he can begin weightbearing. Also states he is trying to get set up with [facility].    Assessment & Plan: Visit Diagnoses:  1. S/P BKA (below knee amputation) unilateral ([place])   2. Non-pressure chronic ulcer of left calf, limited to breakdown of skin ([place])   3. Type 2 diabetes mellitus with diabetic nephropathy, with long-term current use of insulin (HCC)     Plan: Have recommended Silvadene dressings daily. Do not use prosthetic until ulcers have healed. He will follow up in office with [place] in 4 more weeks  Follow-Up Instructions: Return in about 4 weeks (around 04/03/2016).   Ortho Exam Patient is alert and oriented. No adenopathy. Well-dressed. Normal affect. Respirations easy. He believes wearing a wheelchair today. Not wearing his prosthetic on the left. The amputation has healed well as well consolidated however there are 2 ulcerations laterally. Warm is dime-sized in diameter. The distal ulcer is 5 mm in diameter. This filled in with bleeding granulation tissue. No odor noted drainage no sign of infection. No surrounding Erythema.   Imaging: No results found.  Orders:  No orders of the defined types were placed in this encounter.  No orders of the defined types were placed in this encounter.    Procedures: No procedures performed  Clinical Data: No  additional findings.  Subjective: Review of Systems  Constitutional: Negative for chills and fever.  Musculoskeletal: Positive for gait problem.  Skin: Positive for wound.    Objective: Vital Signs: There were no vitals taken for this visit.  Specialty Comments:  No specialty comments available.  PMFS History: Patient Active Problem List   Diagnosis Date Noted  . Non-pressure chronic ulcer of left calf, limited to breakdown of skin ([place]) 02/07/2016  . Hypotension, unspecified 11/24/2015  . Left knee pain 11/24/2015  . Hypotension 11/24/2015  . Decubitus ulcer of buttock 01/22/2015  . S/P BKA (below knee amputation) unilateral ([place]) 01/22/2015  . Chronic venous insufficiency 09/08/2014  . Diabetes mellitus, type 2 ([place]) 09/08/2014  . ESRD on dialysis ([place]) 09/08/2014  . Hyperlipidemia 12/23/2013  . Diabetic foot ([place]) 11/20/2013  . Chronic pain 05/21/2013  . Amputated below knee ([place]) 05/17/2013  . Chronic interstitial cystitis 02/06/2013  . UI (urinary incontinence) 10/29/2012  . CKD (chronic kidney disease) stage 3, GFR 30-59 ml/min 08/18/2012  . Hypothyroidism, acquired 08/04/2012  . OBESITY 09/21/2009  . Essential hypertension 09/21/2009  . DIVERTICULOSIS OF COLON 09/21/2009  . Type 2 diabetes mellitus with diabetic nephropathy, with long-term current use of insulin ([place]) 10/01/2007  . DVT 10/01/2007   Past Medical History:  Diagnosis Date  . Acute respiratory failure ([place])   . Anemia   . Anxiety   .  AR (allergic rhinitis)   . Arthritis    osteoarthritis  . Blood transfusion   . BPH (benign prostatic hypertrophy)   . Cellulitis and abscess of leg   . Cholelithiasis   . Chronic kidney disease    hx of kidney stones  . Chronic kidney disease, stage III (moderate)   . Chronic pain   . Diabetes mellitus    insulin dependent  . Displacement of lumbar intervertebral disc without myelopathy    L4-L5  . Diverticulosis of colon   . DVT (deep venous thrombosis)  (HCC)    Lower Extremity  . Embolism and thrombosis of unspecified artery   . Gallstone   . History of nephrolithiasis    Bilateral  . Hyperlipidemia   . Hypertension   . Hypertrophy of prostate   . Hypothyroidism   . Impotence of organic origin   . Morbid obesity ([place])   . Nephrolithiasis   . Other lymphedema   . Pancreatitis, acute   . Peripheral vascular disease ([place])   . Personal history of arthritis    Osteoarthritis  . Pneumonia   . Sleep apnea    uses cpap  . Unspecified septicemia(038.9) ([place])     Family History  Problem Relation Age of Onset  . Diabetes Mother   . Heart attack Mother   . Stroke Mother   . Colon cancer Father   . Dementia Father   . Heart attack Father   . Arthritis Paternal Grandmother   . Hypertension Other   . Hypothyroidism Other     Past Surgical History:  Procedure Laterality Date  . AMPUTATION  02/07/2011   Procedure: AMPUTATION BELOW KNEE;  Surgeon: Newt Minion, MD;  Location: [place];  Service: Orthopedics;  Laterality: Left;  Left Below Knee Amputation  . bilteral hip     replacement & revison  's   . CYSTOSCOPY W/ RETROGRADES Bilateral 10/24/2012   Procedure: CYSTOSCOPY WITH RETROGRADE PYELOGRAM Procedure: Cystoscopy, Bilateral Retrogarde Pyelograms, Bladder Biopsy, Hydrodistension;  Surgeon: Franchot Gallo, MD;  [facility]: [facility];  Service: Urology;  Laterality: Bilateral;  . KIDNEY SURGERY    . KNEE SURGERY     left knee    . LEG AMPUTATION BELOW KNEE    . REPLACEMENT TOTAL KNEE    . TONSILLECTOMY     Social History   Occupational History  . Not on file.   Social History Main Topics  . Smoking status: Never Smoker  . Smokeless tobacco: Never Used  . Alcohol use No  . Drug use: No  . Sexual activity: Yes

## 2016-03-06 NOTE — Telephone Encounter (Signed)
Per Dr. Nani Ravens recommendation I have refilled Pin needle and insulin syringe TL/CMA

## 2016-03-06 NOTE — Telephone Encounter (Signed)
OK to do-

## 2016-03-08 DIAGNOSIS — N390 Urinary tract infection, site not specified: Secondary | ICD-10-CM | POA: Diagnosis not present

## 2016-03-08 DIAGNOSIS — R319 Hematuria, unspecified: Secondary | ICD-10-CM | POA: Diagnosis not present

## 2016-03-08 DIAGNOSIS — N185 Chronic kidney disease, stage 5: Secondary | ICD-10-CM | POA: Diagnosis not present

## 2016-03-10 ENCOUNTER — Telehealth (INDEPENDENT_AMBULATORY_CARE_PROVIDER_SITE_OTHER): Payer: Self-pay | Admitting: *Deleted

## 2016-03-10 NOTE — Telephone Encounter (Signed)
Pt called stating he needs a new RX faxed to [facility], his wife misplaced the one he was given. He has an appt for this at 10:45 today 1/12

## 2016-03-10 NOTE — Telephone Encounter (Signed)
Script faxed to number provided below.

## 2016-03-10 NOTE — Telephone Encounter (Signed)
[name] called back to provide Fax number to the [facility] office:  [phone].  They are going to go ahead and evaluate him now that he is there, but they are in needed of the script immediately.  Please send ASAP (pt has misplaced)

## 2016-03-13 ENCOUNTER — Telehealth: Payer: Self-pay | Admitting: Family Medicine

## 2016-03-13 ENCOUNTER — Ambulatory Visit: Payer: Medicare Other | Admitting: Family Medicine

## 2016-03-13 NOTE — Telephone Encounter (Signed)
No charge. 

## 2016-03-13 NOTE — Telephone Encounter (Signed)
Patient canceled his appointment for 03/13/16 due to his friend having car trouble. Patient states that he is totally dependent on other people bringing him to the doctor and since today is a holiday the transportation service is unavailable. Patient has rescheduled for 03/20/16. Charge or no charge?

## 2016-03-13 NOTE — Telephone Encounter (Signed)
Pt has >3 no shows in the past 12 mo. Please begin the discharge process.   04/13/15- [name] 5/31/17[name] 01/14/16- [name] 1/15/18[name]

## 2016-03-14 ENCOUNTER — Telehealth: Payer: Self-pay | Admitting: Family Medicine

## 2016-03-14 NOTE — Telephone Encounter (Signed)
Pam- Home health nurse called in to request a new starter kit order. She says that pt changed insurance so she need new orders. She says that pt currently receives in home nursing and also home health.  ° ° °CB: [phone] °

## 2016-03-14 NOTE — Telephone Encounter (Signed)
Please advise. TL/CMA 

## 2016-03-17 NOTE — Telephone Encounter (Signed)
Discharge letter created and given to [name]. TL/CMA

## 2016-03-17 NOTE — Telephone Encounter (Signed)
[name] is calling to follow up on this order request. Please advise   Phone: [phone]

## 2016-03-17 NOTE — Telephone Encounter (Signed)
Please advise. TL/CMA 

## 2016-03-17 NOTE — Telephone Encounter (Signed)
Starter kit in what regard? If she needs a verbal for nursing/PT/OT, OK to give. I'm not sure what is being asked of [place].

## 2016-03-20 ENCOUNTER — Ambulatory Visit (INDEPENDENT_AMBULATORY_CARE_PROVIDER_SITE_OTHER): Payer: Medicare Other | Admitting: Family Medicine

## 2016-03-20 ENCOUNTER — Telehealth: Payer: Self-pay | Admitting: Emergency Medicine

## 2016-03-20 ENCOUNTER — Encounter: Payer: Self-pay | Admitting: Family Medicine

## 2016-03-20 VITALS — BP 115/69 | HR 72 | Temp 97.9°F | Resp 16 | Ht 71.0 in

## 2016-03-20 DIAGNOSIS — Z794 Long term (current) use of insulin: Secondary | ICD-10-CM | POA: Diagnosis not present

## 2016-03-20 DIAGNOSIS — E1121 Type 2 diabetes mellitus with diabetic nephropathy: Secondary | ICD-10-CM

## 2016-03-20 DIAGNOSIS — Z9119 Patient's noncompliance with other medical treatment and regimen: Secondary | ICD-10-CM

## 2016-03-20 DIAGNOSIS — Z91199 Patient's noncompliance with other medical treatment and regimen due to unspecified reason: Secondary | ICD-10-CM

## 2016-03-20 LAB — COMPREHENSIVE METABOLIC PANEL
ALK PHOS: 84 U/L (ref 39–117)
ALT: 5 U/L (ref 0–53)
AST: 6 U/L (ref 0–37)
Albumin: 3.4 g/dL — ABNORMAL LOW (ref 3.5–5.2)
BILIRUBIN TOTAL: 0.6 mg/dL (ref 0.2–1.2)
BUN: 46 mg/dL — ABNORMAL HIGH (ref 6–23)
CALCIUM: 7.2 mg/dL — AB (ref 8.4–10.5)
CO2: 24 mEq/L (ref 19–32)
CREATININE: 6.83 mg/dL — AB (ref 0.40–1.50)
Chloride: 100 mEq/L (ref 96–112)
GFR: 8.87 mL/min — AB (ref >60.00)
GLUCOSE: 140 mg/dL — AB (ref 70–99)
Potassium: 4.5 mEq/L (ref 3.5–5.1)
Sodium: 136 mEq/L (ref 135–145)
TOTAL PROTEIN: 7.1 g/dL (ref 6.0–8.3)

## 2016-03-20 LAB — LIPID PANEL
Cholesterol: 87 mg/dL (ref 0–200)
HDL: 17.4 mg/dL — AB (ref >39.00)
LDL Cholesterol: 32 mg/dL (ref 0–99)
NONHDL: 69.87
Total CHOL/HDL Ratio: 5
Triglycerides: 187 mg/dL — ABNORMAL HIGH (ref 0.0–149.0)
VLDL: 37.4 mg/dL (ref 0.0–40.0)

## 2016-03-20 LAB — HEMOGLOBIN A1C: Hgb A1c MFr Bld: 5.3 % (ref 4.6–6.5)

## 2016-03-20 NOTE — Telephone Encounter (Signed)
Hope from [facility] called Critical Creat 6.83 & GFR 8.87.Marland KitchenMarland KitchenKMP reported to [place] CMA

## 2016-03-20 NOTE — Telephone Encounter (Signed)
Yes, that's exactly what they are requesting Dr. Nani Ravens. There plan is to work with pt once on week 1 and week 2-8 twice a week.   I read the message to Santiago Glad which is the supervisor that called in to follow up on request. They will begin working with pt.

## 2016-03-20 NOTE — Progress Notes (Signed)
Subjective:   Chief Complaint  Patient presents with  . Diabetes    Pt here for follow up. Unsure when he will do eye exam but states we can place referral.  Needs foot exam today.    [name] is a 61 y.o. male here for follow-up of diabetes.   [name]'s self monitored glucose range is 130's in AM.  Patient denies hypoglycemic reactions. He checks his glucose levels 2 times per day. Patient does require insulin. On 40 units of Lantus nightly. Sliding scale for morning only, usually around 3 units. Medications include: Insulin Patient exercises 0 days per week on average.   He does take an aspirin daily. Statin? Yes ACEi/ARB? No- was hypotensive and anti-hypertensives were stopped  Past Medical History:  Diagnosis Date  . Anemia   . Anxiety   . AR (allergic rhinitis)   . Arthritis    osteoarthritis  . Blood transfusion   . BPH (benign prostatic hypertrophy)   . Cholelithiasis   . Chronic kidney disease    hx of kidney stones  . Chronic pain   . Diabetes mellitus    insulin dependent  . Displacement of lumbar intervertebral disc without myelopathy    L4-L5  . Diverticulosis of colon   . DVT (deep venous thrombosis) (HCC)    Lower Extremity  . Embolism and thrombosis of unspecified artery   . Gallstone   . History of nephrolithiasis    Bilateral  . Hyperlipidemia   . Hypertension   . Hypertrophy of prostate   . Hypothyroidism   . Impotence of organic origin   . Morbid obesity ([place])   . Nephrolithiasis   . Other lymphedema   . Pancreatitis, acute   . Peripheral vascular disease ([place])   . Personal history of arthritis    Osteoarthritis  . Sleep apnea    uses cpap  . Unspecified septicemia(038.9) [facility])     Past Surgical History:  Procedure Laterality Date  . AMPUTATION  02/07/2011   Procedure: AMPUTATION BELOW KNEE;  Surgeon: Newt Minion, MD;  Location: [facility];  Service: Orthopedics;  Laterality: Left;  Left Below Knee Amputation  . bilteral hip     replacement & revison  's   . CYSTOSCOPY W/ RETROGRADES Bilateral 10/24/2012   Procedure: CYSTOSCOPY WITH RETROGRADE PYELOGRAM Procedure: Cystoscopy, Bilateral Retrogarde Pyelograms, Bladder Biopsy, Hydrodistension;  Surgeon: Franchot Gallo, MD;  [facility];  Service: Urology;  Laterality: Bilateral;  . KIDNEY SURGERY    . KNEE SURGERY     left knee    . LEG AMPUTATION BELOW KNEE    . REPLACEMENT TOTAL KNEE    . TONSILLECTOMY      Social History   Social History  . Marital status: Married   Social History Main Topics  . Smoking status: Never Smoker  . Smokeless tobacco: Never Used  . Alcohol use No  . Drug use: No  . Sexual activity: Yes   Social History Narrative   Lives with his wife and uses a wheelchair for transfers.      Current Outpatient Prescriptions on File Prior to Visit  Medication Sig Dispense Refill  . Ascorbic Acid (VITAMIN C PO) Take 1 tablet by mouth 2 (two) times daily.    Marland Kitchen atorvastatin (LIPITOR) 40 MG tablet TAKE 1 TABLET (40 MG TOTAL) BY MOUTH DAILY. 90 tablet 1  . B-D INS SYRINGE 0.5CC/31GX5/16 31G X 5/16" 0.5 ML MISC USE 4 TIMES A DAY FOR DIABETES (DX E11.29) 100 each  1  . B-D ULTRAFINE III SHORT PEN 31G X 8 MM MISC USE AS DIRECTED WITH BASAGLAR PEN 100 each 0  . ferrous sulfate 325 (65 FE) MG tablet Take 1 tablet (325 mg total) by mouth 2 (two) times daily. 180 tablet 1  . gabapentin (NEURONTIN) 100 MG capsule TAKE 1 CAPSULE (100 MG TOTAL) BY MOUTH 3 (THREE) TIMES DAILY. 90 capsule 1  . Insulin Glargine (BASAGLAR KWIKPEN) 100 UNIT/ML SOPN Inject 40 Units into the skin at bedtime.  1  . Insulin Syringe-Needle U-100 (B-D INS SYRINGE 0.5CC/31GX5/16) 31G X 5/16" 0.5 ML MISC USE 4 TIMES A DAY FOR DIABETES BLOOD GLUCOSE TESTING DX: E11.29 300 each 1  . levothyroxine (SYNTHROID, LEVOTHROID) 200 MCG tablet Take 1 tablet (200 mcg total) by mouth every morning. 90 tablet 0  . levothyroxine (SYNTHROID, LEVOTHROID) 25 MCG tablet TAKE 1 TABLET (25 MCG TOTAL) BY  MOUTH DAILY BEFORE BREAKFAST. 90 tablet 1  . methenamine (HIPREX) 1 g tablet Take 1 g by mouth 2 (two) times daily.    . midodrine (PROAMATINE) 5 MG tablet Take 1 tablet (5 mg total) by mouth 3 (three) times daily. 90 tablet 2  . Multiple Vitamins-Minerals (MULTIVITAMINS THER. W/MINERALS) TABS Take 1 tablet by mouth daily.     Marland Kitchen NOVOLOG 100 UNIT/ML injection INJECT 3 UNITS 3 TIMES A DAY WITH MEALS AND AT BEDTIME IF SUGAR IS GREATER THAN 150 (DX E11.29) (Patient taking differently: INJECT 3 TIMES A DAY WITH MEALS AND AT BEDTIME on a sliding scale (DX E11.29)) 40 mL 0  . sevelamer carbonate (RENVELA) 800 MG tablet Take 800 mg by mouth 3 (three) times daily with meals.     Related testing: Foot exam(monofilament and inspection): Refuses, would like to see podiatrist Retinal exam:done Date of retinal exam: Due Pneumovax: 2014 Flu Shot: done  Review of Systems: Eye:  No recent significant change in vision Pulmonary:  No SOB Cardiovascular:  No chest pain, no palpitations Skin/Integumentary ROS:  No abnormal skin lesions reported Neurologic:  No numbness, tingling  Objective:  BP 115/69 (BP Location: Left Arm, Cuff Size: Large)   Pulse 72   Temp 97.9 F (36.6 C) (Oral)   Resp 16   Ht 5\' 11"  (1.803 m)   SpO2 100% Comment: room air General:  Well developed, well nourished, in no apparent distress Head:  Normocephalic, atraumatic Eyes:  Pupils equal and round, sclera anicteric without injection  Nose:  External nares without trauma, no discharge Throat/Pharynx:  Lips and gingiva without lesion Neck: Neck supple.  No obvious thyromegaly or masses.  No bruits Lungs:  clear to auscultation, breath sounds equal bilaterally, no wheezes, rales, or stridor Cardio:  regular rate and rhythm without murmurs, no bruits, no LE edema Abdomen:  Abdomen soft, non-tender, BS normal Musculoskeletal:  BKA noted on the L Psych: Age appropriate judgment and insight  Assessment:   Type 2 diabetes  mellitus with diabetic nephropathy, with long-term current use of insulin ([place]) - Plan: Ambulatory referral to Ophthalmology, Lipid panel, Comprehensive metabolic panel, Hemoglobin A1c, Microalbumin / creatinine urine ratio, Ambulatory referral to Podiatry   Plan:   Orders as above. Pt declined to leave a urine sample today in addition to getting lab work done.  He did refuse to take off his shoe/sock for me to do a foot exam today. I offered to refer him to podiatry as an alternative, to which he agreed. He is being discharged for repeated no-shows to the office. The patient voiced understanding and  agreement to the plan.  [place], DO 03/20/16 11:24 AM

## 2016-03-20 NOTE — Telephone Encounter (Signed)
For clarification, they got what they needed? TY.

## 2016-03-20 NOTE — Telephone Encounter (Signed)
Report given to Dr Nani Ravens.

## 2016-03-20 NOTE — Progress Notes (Signed)
Pre visit review using our clinic review tool, if applicable. No additional management support is needed unless otherwise documented below in the visit note. 

## 2016-03-21 NOTE — Telephone Encounter (Signed)
Yes

## 2016-03-23 NOTE — Progress Notes (Signed)
Called home/cell number left message to call back.

## 2016-03-25 ENCOUNTER — Other Ambulatory Visit: Payer: Self-pay | Admitting: Physician Assistant

## 2016-03-25 NOTE — Telephone Encounter (Signed)
Will defer further refills of patient's medications to PCP  

## 2016-03-27 ENCOUNTER — Telehealth: Payer: Self-pay | Admitting: Family Medicine

## 2016-03-27 NOTE — Telephone Encounter (Signed)
OK 

## 2016-03-27 NOTE — Telephone Encounter (Signed)
Please advise. TL/CMA 

## 2016-03-27 NOTE — Telephone Encounter (Signed)
Caller name: Shanice  Relation to pt: Education officer, museum from [employer] back number: [phone]   Pharmacy:  Reason for call:  Social worker wanted to inform PCP unable to reach patient for home visit.

## 2016-03-28 ENCOUNTER — Telehealth: Payer: Self-pay

## 2016-03-28 NOTE — Telephone Encounter (Signed)
OK to do Humalog instead of Novolog then.

## 2016-03-28 NOTE — Telephone Encounter (Signed)
Patient insurance will only cover Novolog for #30 days and will no longer be covered after that. Insurance suggest Humalog. Please advise how we should move forward.

## 2016-03-29 ENCOUNTER — Telehealth: Payer: Self-pay | Admitting: Family Medicine

## 2016-03-29 MED ORDER — INSULIN LISPRO 100 UNIT/ML ~~LOC~~ SOLN: SUBCUTANEOUS | 3 refills | Status: DC

## 2016-03-29 NOTE — Telephone Encounter (Signed)
Per [employer] request patient has been switched to Humalog. TL/CMA

## 2016-03-29 NOTE — Telephone Encounter (Signed)
Patient dismissed from [facility] by Riki Sheer Do , effective March 17, 2016. Dismissal letter sent out by certified / registered mail.  DAJ

## 2016-03-30 DIAGNOSIS — I10 Essential (primary) hypertension: Secondary | ICD-10-CM | POA: Diagnosis not present

## 2016-03-30 DIAGNOSIS — D631 Anemia in chronic kidney disease: Secondary | ICD-10-CM | POA: Diagnosis not present

## 2016-03-30 DIAGNOSIS — N186 End stage renal disease: Secondary | ICD-10-CM | POA: Diagnosis not present

## 2016-04-01 DIAGNOSIS — D631 Anemia in chronic kidney disease: Secondary | ICD-10-CM | POA: Diagnosis not present

## 2016-04-01 DIAGNOSIS — N186 End stage renal disease: Secondary | ICD-10-CM | POA: Diagnosis not present

## 2016-04-03 ENCOUNTER — Ambulatory Visit (INDEPENDENT_AMBULATORY_CARE_PROVIDER_SITE_OTHER): Payer: Medicare Other | Admitting: Orthopedic Surgery

## 2016-04-03 ENCOUNTER — Encounter (INDEPENDENT_AMBULATORY_CARE_PROVIDER_SITE_OTHER): Payer: Self-pay | Admitting: Orthopedic Surgery

## 2016-04-03 VITALS — Ht 71.0 in | Wt 275.0 lb

## 2016-04-03 DIAGNOSIS — Z89512 Acquired absence of left leg below knee: Secondary | ICD-10-CM | POA: Diagnosis not present

## 2016-04-03 NOTE — Progress Notes (Signed)
Office Visit Note   Patient: [name]           Date of Birth: January 13, 1956           MRN: [id number] Visit Date: 04/03/2016              Requested by: Shelda Pal, DO [address], [place] [address] PCP: Shelda Pal, DO  Chief Complaint  Patient presents with  . Left Leg - Follow-up    Calf ulcer    HPI: Patient has a small pea sized open area on the lateral side of his BKA. Pt has this covered with DuoDerm. He is also wearing his shrinker.  The area is flat, pink and draining a spot amount of yellowish drain and there is slight maceration to the wound boarder. The pt does not voice any concerns today. Autumn L Forrest, RMA  Patient does undergo dialysis and he gets large fluctuation changes in the volume of the residual limb due to dialysis currently working with [employer].  Assessment & Plan: Visit Diagnoses:  1. History of left below knee amputation [facility])     Plan: Patient was placed in a black medical compression stump shrinker. This was folded down 4 fingerbreadths the importance of checking the top edges to make sure he doesn't develop any pressure points of his thigh was discussed proper care and washing instructions were discussed. Patient will follow-up [facility] for prosthetic fitting changes.  Follow-Up Instructions: Return in about 4 weeks (around 05/01/2016).   Ortho Exam On examination patient is alert oriented no adenopathy well-dressed normal affect normal respiratory effort he has massive pitting edema in the left transtibial amputation. He has 2 small wounds that are about 5 mm in diameter 0.1 mm deep with healthy granulation tissue there is no cellulitis no drainage no signs of infection. Patient's increased swelling is most likely due to the fact that he has had no dialysis since Saturday.  Imaging: No results found.  Orders:  No orders of the defined types were placed in this encounter.  No orders of the defined  types were placed in this encounter.    Procedures: No procedures performed  Clinical Data: No additional findings.  Subjective: Review of Systems  Objective: Vital Signs: Ht 5\' 11"  (1.803 m)   Wt 275 lb (124.7 kg)   BMI 38.35 kg/m   Specialty Comments:  No specialty comments available.  PMFS History: Patient Active Problem List   Diagnosis Date Noted  . Non-pressure chronic ulcer of left calf, limited to breakdown of skin ([place]) 02/07/2016  . Hypotension, unspecified 11/24/2015  . Left knee pain 11/24/2015  . Hypotension 11/24/2015  . Decubitus ulcer of buttock 01/22/2015  . S/P BKA (below knee amputation) unilateral ([place]) 01/22/2015  . Chronic venous insufficiency 09/08/2014  . Diabetes mellitus, type 2 ([place]) 09/08/2014  . ESRD on dialysis ([place]) 09/08/2014  . Hyperlipidemia 12/23/2013  . Diabetic foot ([place]) 11/20/2013  . Chronic pain 05/21/2013  . History of left below knee amputation ([place]) 05/17/2013  . Chronic interstitial cystitis 02/06/2013  . UI (urinary incontinence) 10/29/2012  . CKD (chronic kidney disease) stage 3, GFR 30-59 ml/min 08/18/2012  . Hypothyroidism, acquired 08/04/2012  . OBESITY 09/21/2009  . Essential hypertension 09/21/2009  . DIVERTICULOSIS OF COLON 09/21/2009  . Type 2 diabetes mellitus with diabetic nephropathy, with long-term current use of insulin ([place]) 10/01/2007  . DVT 10/01/2007   Past Medical History:  Diagnosis Date  . Anemia   .  Anxiety   . AR (allergic rhinitis)   . Arthritis    osteoarthritis  . Blood transfusion   . BPH (benign prostatic hypertrophy)   . Cholelithiasis   . Chronic kidney disease    hx of kidney stones  . Chronic pain   . Diabetes mellitus    insulin dependent  . Displacement of lumbar intervertebral disc without myelopathy    L4-L5  . Diverticulosis of colon   . DVT (deep venous thrombosis) (HCC)    Lower Extremity  . Embolism and thrombosis of unspecified artery   . Gallstone   . History of  nephrolithiasis    Bilateral  . Hyperlipidemia   . Hypertension   . Hypertrophy of prostate   . Hypothyroidism   . Impotence of organic origin   . Morbid obesity ([place])   . Nephrolithiasis   . Other lymphedema   . Pancreatitis, acute   . Peripheral vascular disease ([place])   . Personal history of arthritis    Osteoarthritis  . Sleep apnea    uses cpap  . Unspecified septicemia(038.9) ([place])     Family History  Problem Relation Age of Onset  . Diabetes Mother   . Heart attack Mother   . Stroke Mother   . Colon cancer Father   . Dementia Father   . Heart attack Father   . Arthritis Paternal Grandmother   . Hypertension Other   . Hypothyroidism Other     Past Surgical History:  Procedure Laterality Date  . AMPUTATION  02/07/2011   Procedure: AMPUTATION BELOW KNEE;  Surgeon: Newt Minion, MD;  Location: [facility];  Service: Orthopedics;  Laterality: Left;  Left Below Knee Amputation  . bilteral hip     replacement & revison  's   . CYSTOSCOPY W/ RETROGRADES Bilateral 10/24/2012   Procedure: CYSTOSCOPY WITH RETROGRADE PYELOGRAM Procedure: Cystoscopy, Bilateral Retrogarde Pyelograms, Bladder Biopsy, Hydrodistension;  Surgeon: Franchot Gallo, MD;  [facility];  Service: Urology;  Laterality: Bilateral;  . KIDNEY SURGERY    . KNEE SURGERY     left knee    . LEG AMPUTATION BELOW KNEE    . REPLACEMENT TOTAL KNEE    . TONSILLECTOMY     Social History   Occupational History  . Not on file.   Social History Main Topics  . Smoking status: Never Smoker  . Smokeless tobacco: Never Used  . Alcohol use No  . Drug use: No  . Sexual activity: Yes

## 2016-04-04 DIAGNOSIS — N186 End stage renal disease: Secondary | ICD-10-CM | POA: Diagnosis not present

## 2016-04-04 DIAGNOSIS — D631 Anemia in chronic kidney disease: Secondary | ICD-10-CM | POA: Diagnosis not present

## 2016-04-06 DIAGNOSIS — D631 Anemia in chronic kidney disease: Secondary | ICD-10-CM | POA: Diagnosis not present

## 2016-04-06 DIAGNOSIS — N186 End stage renal disease: Secondary | ICD-10-CM | POA: Diagnosis not present

## 2016-04-06 DIAGNOSIS — E1165 Type 2 diabetes mellitus with hyperglycemia: Secondary | ICD-10-CM | POA: Diagnosis not present

## 2016-04-06 DIAGNOSIS — Z1159 Encounter for screening for other viral diseases: Secondary | ICD-10-CM | POA: Diagnosis not present

## 2016-04-08 DIAGNOSIS — N186 End stage renal disease: Secondary | ICD-10-CM | POA: Diagnosis not present

## 2016-04-08 DIAGNOSIS — D631 Anemia in chronic kidney disease: Secondary | ICD-10-CM | POA: Diagnosis not present

## 2016-04-12 DIAGNOSIS — N186 End stage renal disease: Secondary | ICD-10-CM | POA: Diagnosis not present

## 2016-04-12 DIAGNOSIS — D631 Anemia in chronic kidney disease: Secondary | ICD-10-CM | POA: Diagnosis not present

## 2016-04-13 DIAGNOSIS — D631 Anemia in chronic kidney disease: Secondary | ICD-10-CM | POA: Diagnosis not present

## 2016-04-13 DIAGNOSIS — N186 End stage renal disease: Secondary | ICD-10-CM | POA: Diagnosis not present

## 2016-04-14 DIAGNOSIS — I120 Hypertensive chronic kidney disease with stage 5 chronic kidney disease or end stage renal disease: Secondary | ICD-10-CM | POA: Diagnosis not present

## 2016-04-14 DIAGNOSIS — Z89512 Acquired absence of left leg below knee: Secondary | ICD-10-CM | POA: Diagnosis not present

## 2016-04-14 DIAGNOSIS — L308 Other specified dermatitis: Secondary | ICD-10-CM | POA: Diagnosis not present

## 2016-04-14 DIAGNOSIS — N186 End stage renal disease: Secondary | ICD-10-CM | POA: Diagnosis not present

## 2016-04-14 DIAGNOSIS — T85898S Other specified complication of other internal prosthetic devices, implants and grafts, sequela: Secondary | ICD-10-CM | POA: Diagnosis not present

## 2016-04-14 DIAGNOSIS — I770 Arteriovenous fistula, acquired: Secondary | ICD-10-CM | POA: Diagnosis not present

## 2016-04-14 DIAGNOSIS — Z992 Dependence on renal dialysis: Secondary | ICD-10-CM | POA: Diagnosis not present

## 2016-04-14 DIAGNOSIS — I890 Lymphedema, not elsewhere classified: Secondary | ICD-10-CM | POA: Diagnosis not present

## 2016-04-15 DIAGNOSIS — D631 Anemia in chronic kidney disease: Secondary | ICD-10-CM | POA: Diagnosis not present

## 2016-04-15 DIAGNOSIS — N186 End stage renal disease: Secondary | ICD-10-CM | POA: Diagnosis not present

## 2016-04-18 ENCOUNTER — Telehealth: Payer: Self-pay | Admitting: Family Medicine

## 2016-04-18 DIAGNOSIS — D631 Anemia in chronic kidney disease: Secondary | ICD-10-CM | POA: Diagnosis not present

## 2016-04-18 DIAGNOSIS — N186 End stage renal disease: Secondary | ICD-10-CM | POA: Diagnosis not present

## 2016-04-18 NOTE — Telephone Encounter (Signed)
[facility] called today regarding this patient.   Informed that the patient has been dismissed from our practice on 03/17/2016 by Dr. Nani Ravens.  She will contact the patient then

## 2016-04-20 DIAGNOSIS — N186 End stage renal disease: Secondary | ICD-10-CM | POA: Diagnosis not present

## 2016-04-20 DIAGNOSIS — D631 Anemia in chronic kidney disease: Secondary | ICD-10-CM | POA: Diagnosis not present

## 2016-04-22 DIAGNOSIS — N186 End stage renal disease: Secondary | ICD-10-CM | POA: Diagnosis not present

## 2016-04-22 DIAGNOSIS — D631 Anemia in chronic kidney disease: Secondary | ICD-10-CM | POA: Diagnosis not present

## 2016-04-25 ENCOUNTER — Telehealth: Payer: Self-pay | Admitting: Family Medicine

## 2016-04-25 DIAGNOSIS — D631 Anemia in chronic kidney disease: Secondary | ICD-10-CM | POA: Diagnosis not present

## 2016-04-25 DIAGNOSIS — N186 End stage renal disease: Secondary | ICD-10-CM | POA: Diagnosis not present

## 2016-04-25 NOTE — Telephone Encounter (Signed)
lvm advising patient to schedule AWV °

## 2016-04-25 NOTE — Telephone Encounter (Signed)
OK TY

## 2016-04-25 NOTE — Telephone Encounter (Signed)
[facility] called to request orders for skilled nursing and a home health aid. Please advise  Phone: [phone]

## 2016-04-26 NOTE — Telephone Encounter (Signed)
Called and [facility] @ 9:27am @ [phone]) for [name] with [facility] to RTC regarding orders for the pt.//AB/CMA

## 2016-04-27 DIAGNOSIS — I10 Essential (primary) hypertension: Secondary | ICD-10-CM | POA: Diagnosis not present

## 2016-04-27 DIAGNOSIS — D631 Anemia in chronic kidney disease: Secondary | ICD-10-CM | POA: Diagnosis not present

## 2016-04-27 DIAGNOSIS — N186 End stage renal disease: Secondary | ICD-10-CM | POA: Diagnosis not present

## 2016-04-27 DIAGNOSIS — D509 Iron deficiency anemia, unspecified: Secondary | ICD-10-CM | POA: Diagnosis not present

## 2016-04-27 NOTE — Telephone Encounter (Signed)
OK. He is no longer a patient of [facility] though. He will need to find a new PCP to approve these orders in the future. TY.

## 2016-04-27 NOTE — Telephone Encounter (Signed)
Pam returned call for orders. She said that the number provided is her personal cell. It is okay to lvm stating yes or no on start of home health service

## 2016-04-28 NOTE — Telephone Encounter (Signed)
[name] calling back checking on the status of message below, informed [name] of PCP instructions but [name] would like to speak with nurse directly. Best  [phone] and please leave a message confirming PCP instructions,

## 2016-04-28 NOTE — Telephone Encounter (Signed)
Called and spoke with Olin Hauser with [facility].  Informed her that Dr. Napoleon Form the order for skilled nursing and home health aid for the pt.    Also informed her per Dr. Nani Ravens that the pt is no longer a patient of [facility], and he will need to find a new PCP to approve these orders in the future.  She verbalized understanding and asked how long will this order stand.  Per Dr. Nani Ravens the order will be for 30 days.  She verbalized understanding and agreed.//AB/CMA

## 2016-04-29 DIAGNOSIS — D631 Anemia in chronic kidney disease: Secondary | ICD-10-CM | POA: Diagnosis not present

## 2016-04-29 DIAGNOSIS — N186 End stage renal disease: Secondary | ICD-10-CM | POA: Diagnosis not present

## 2016-04-29 DIAGNOSIS — D509 Iron deficiency anemia, unspecified: Secondary | ICD-10-CM | POA: Diagnosis not present

## 2016-05-01 ENCOUNTER — Ambulatory Visit (INDEPENDENT_AMBULATORY_CARE_PROVIDER_SITE_OTHER): Payer: Medicare Other | Admitting: Orthopedic Surgery

## 2016-05-01 NOTE — Telephone Encounter (Signed)
Certified dismissal letter returned as undeliverable, unclaimed, return to sender after three attempts by [employer]PS on May 01, 2016.  Letter placed in another envelope and resent as 1st class mail which does not require a signature. DAJ

## 2016-05-02 DIAGNOSIS — N186 End stage renal disease: Secondary | ICD-10-CM | POA: Diagnosis not present

## 2016-05-02 DIAGNOSIS — D631 Anemia in chronic kidney disease: Secondary | ICD-10-CM | POA: Diagnosis not present

## 2016-05-02 DIAGNOSIS — D509 Iron deficiency anemia, unspecified: Secondary | ICD-10-CM | POA: Diagnosis not present

## 2016-05-04 DIAGNOSIS — E1165 Type 2 diabetes mellitus with hyperglycemia: Secondary | ICD-10-CM | POA: Diagnosis not present

## 2016-05-04 DIAGNOSIS — N186 End stage renal disease: Secondary | ICD-10-CM | POA: Diagnosis not present

## 2016-05-04 DIAGNOSIS — D631 Anemia in chronic kidney disease: Secondary | ICD-10-CM | POA: Diagnosis not present

## 2016-05-04 DIAGNOSIS — D509 Iron deficiency anemia, unspecified: Secondary | ICD-10-CM | POA: Diagnosis not present

## 2016-05-06 DIAGNOSIS — N186 End stage renal disease: Secondary | ICD-10-CM | POA: Diagnosis not present

## 2016-05-06 DIAGNOSIS — D631 Anemia in chronic kidney disease: Secondary | ICD-10-CM | POA: Diagnosis not present

## 2016-05-06 DIAGNOSIS — D509 Iron deficiency anemia, unspecified: Secondary | ICD-10-CM | POA: Diagnosis not present

## 2016-05-08 ENCOUNTER — Ambulatory Visit (INDEPENDENT_AMBULATORY_CARE_PROVIDER_SITE_OTHER): Payer: Medicare Other | Admitting: Orthopedic Surgery

## 2016-05-09 DIAGNOSIS — D509 Iron deficiency anemia, unspecified: Secondary | ICD-10-CM | POA: Diagnosis not present

## 2016-05-09 DIAGNOSIS — D631 Anemia in chronic kidney disease: Secondary | ICD-10-CM | POA: Diagnosis not present

## 2016-05-09 DIAGNOSIS — N186 End stage renal disease: Secondary | ICD-10-CM | POA: Diagnosis not present

## 2016-05-11 DIAGNOSIS — D631 Anemia in chronic kidney disease: Secondary | ICD-10-CM | POA: Diagnosis not present

## 2016-05-11 DIAGNOSIS — D509 Iron deficiency anemia, unspecified: Secondary | ICD-10-CM | POA: Diagnosis not present

## 2016-05-11 DIAGNOSIS — N186 End stage renal disease: Secondary | ICD-10-CM | POA: Diagnosis not present

## 2016-05-12 ENCOUNTER — Telehealth: Payer: Self-pay | Admitting: Family Medicine

## 2016-05-12 ENCOUNTER — Ambulatory Visit: Payer: Medicare Other | Admitting: *Deleted

## 2016-05-12 NOTE — Telephone Encounter (Signed)
Pt came in office stating had an appt for today 05-12-2016 for AWV at 11:30, on the system the appt was canceled since pt was dismissed from our office since 03-11-2016, pt was only informed that his appt was canceled and that he was going to receive a letter by mail. Pt stated that he received a call to confirm his appt and that was not informed about the cancellation, pt was upset and stated that wanted to set up another appt for his AWV or provider and did not want to leave, pt numerous time was mentioned had to wait to received letter and from there can call if needed. Pt still was very upset and stated "what does that mean I can't see a provider here", pt still wanted to set up an appt with provider, so I had to verify with Dr Nani Ravens to see the protocol and we spoke with Dr Charlett Blake since Charlena Cross, [place] and [place]) managers were not in office. Dr Charlett Blake was informed about situation, provider was going to spoke with pt with security guard in office.

## 2016-05-13 DIAGNOSIS — N186 End stage renal disease: Secondary | ICD-10-CM | POA: Diagnosis not present

## 2016-05-13 DIAGNOSIS — D631 Anemia in chronic kidney disease: Secondary | ICD-10-CM | POA: Diagnosis not present

## 2016-05-13 DIAGNOSIS — D509 Iron deficiency anemia, unspecified: Secondary | ICD-10-CM | POA: Diagnosis not present

## 2016-05-14 NOTE — Telephone Encounter (Signed)
Spoke with patient for more than 30 minutes with nursing and security present and gave him copies of  His dismissal letter and the certified letter form that was returned unsigned. He was given a copy of the list of his No Show appointments as well. He declined to accept that he had missed so many appointments and kept saying that the computer was wrong. He was informed that he had been dismissed from the practice and that he had 30 days to find a new physician. He was upset and he was told we would have an administrator call him next week and give him some options for other offices to contact.

## 2016-05-16 DIAGNOSIS — D509 Iron deficiency anemia, unspecified: Secondary | ICD-10-CM | POA: Diagnosis not present

## 2016-05-16 DIAGNOSIS — N186 End stage renal disease: Secondary | ICD-10-CM | POA: Diagnosis not present

## 2016-05-16 DIAGNOSIS — D631 Anemia in chronic kidney disease: Secondary | ICD-10-CM | POA: Diagnosis not present

## 2016-05-17 ENCOUNTER — Encounter (INDEPENDENT_AMBULATORY_CARE_PROVIDER_SITE_OTHER): Payer: Self-pay | Admitting: Orthopedic Surgery

## 2016-05-17 ENCOUNTER — Ambulatory Visit (INDEPENDENT_AMBULATORY_CARE_PROVIDER_SITE_OTHER): Payer: BC Managed Care – PPO | Admitting: Orthopedic Surgery

## 2016-05-17 DIAGNOSIS — Z89519 Acquired absence of unspecified leg below knee: Secondary | ICD-10-CM

## 2016-05-17 DIAGNOSIS — L97221 Non-pressure chronic ulcer of left calf limited to breakdown of skin: Secondary | ICD-10-CM

## 2016-05-17 NOTE — Progress Notes (Signed)
Office Visit Note   Patient: [name]           Date of Birth: 1955/04/08           MRN: [id number] Visit Date: 05/17/2016              Requested by: Shelda Pal, DO [address], [place] [address] PCP: No PCP Per Patient  Chief Complaint  Patient presents with  . Left Knee - Follow-up    LT BKA chronic ulceration    HPI: The patient is a 61 year old gentleman who presents today in follow-up for ulceration to his left below the knee amputation. Has recently lost his primary care provider. States was dropped due to too many missed appointments. States home health is also stopped coming, they were previously doing silver alginate dressings ([facility]). today has dry flaking skin complains that there is no one to help him do dressing changes.  States would like to begin going to [facility]. States has gotten a lot weaker would like physical therapy to assist with gait training and strength training.  Has been out of his prosthetic awaiting wound healing.  Assessment & Plan: Visit Diagnoses:  1. S/P BKA (below knee amputation) unilateral ([place])   2. Non-pressure chronic ulcer of left calf, limited to breakdown of skin ([place])     Plan: wife to do Silvadene dressing changes daily. Advised proper wear of the shrinker. Is to wear the shrinker daily above the knee properly.  Follow-Up Instructions: Return in about 3 weeks (around 06/07/2016).   Ortho Exam Physical Exam  Constitutional: Appears well-developed.  Head: Normocephalic.  Eyes: EOM are normal.  Neck: Normal range of motion.  Cardiovascular: Normal rate.   Pulmonary/Chest: Effort normal.  Neurological: Is alert.  Skin: Skin is warm.  Psychiatric: Has a normal mood and affect. Him left below the knee amputation: This is well consolidated and well healed. Laterally there is a small ulceration is covered in eschar this measures 6 mm in diameter. There is no surrounding  erythema no drainage no odor no sign of infection no cellulitis. Imaging: No results found.  Labs: Lab Results  Component Value Date   HGBA1C 5.3 03/20/2016   HGBA1C 5.3 04/29/2015   HGBA1C 6.5 (A) 12/24/2013   ESRSEDRATE 57 (H) 06/13/2010   ESRSEDRATE 40 (H) Jun 02, 202011   ESRSEDRATE 8 11/26/2008   CRP 4.8 (H) Jun 02, 202011   CRP 0.6 (H) 11/26/2008   LABURIC 8.3 (H) 11/25/2015   LABURIC 6.2 10/10/2008   LABURIC 5.6 03/10/2008   REPTSTATUS 12/01/2015 FINAL 11/26/2015   GRAMSTAIN  08/16/2012    NO WBC SEEN MODERATE SQUAMOUS EPITHELIAL CELLS PRESENT MODERATE YEAST   CULT NO GROWTH 5 DAYS 11/26/2015   LABORGA ESCHERICHIA COLI 08/03/2014    Orders:  No orders of the defined types were placed in this encounter.  No orders of the defined types were placed in this encounter.    Procedures: No procedures performed  Clinical Data: No additional findings.  Subjective: Review of Systems  Constitutional: Negative for chills and fever.  Skin: Positive for wound.    Objective: Vital Signs: There were no vitals taken for this visit.  Specialty Comments:  No specialty comments available.  PMFS History: Patient Active Problem List   Diagnosis Date Noted  . Non-pressure chronic ulcer of left calf, limited to breakdown of skin ([place]) 02/07/2016  . Hypotension, unspecified 11/24/2015  . Left knee pain 11/24/2015  .  Hypotension 11/24/2015  . Decubitus ulcer of buttock 01/22/2015  . S/P BKA (below knee amputation) unilateral ([place]) 01/22/2015  . Chronic venous insufficiency 09/08/2014  . Diabetes mellitus, type 2 ([place]) 09/08/2014  . ESRD on dialysis ([place]) 09/08/2014  . Hyperlipidemia 12/23/2013  . Diabetic foot ([place]) 11/20/2013  . Chronic pain 05/21/2013  . Chronic interstitial cystitis 02/06/2013  . UI (urinary incontinence) 10/29/2012  . CKD (chronic kidney disease) stage 3, GFR 30-59 ml/min 08/18/2012  . Hypothyroidism, acquired 08/04/2012  . OBESITY 09/21/2009  .  Essential hypertension 09/21/2009  . DIVERTICULOSIS OF COLON 09/21/2009  . Type 2 diabetes mellitus with diabetic nephropathy, with long-term current use of insulin ([place]) 10/01/2007  . DVT 10/01/2007   Past Medical History:  Diagnosis Date  . Anemia   . Anxiety   . AR (allergic rhinitis)   . Arthritis    osteoarthritis  . Blood transfusion   . BPH (benign prostatic hypertrophy)   . Cholelithiasis   . Chronic kidney disease    hx of kidney stones  . Chronic pain   . Diabetes mellitus    insulin dependent  . Displacement of lumbar intervertebral disc without myelopathy    L4-L5  . Diverticulosis of colon   . DVT (deep venous thrombosis) (HCC)    Lower Extremity  . Embolism and thrombosis of unspecified artery   . Gallstone   . History of nephrolithiasis    Bilateral  . Hyperlipidemia   . Hypertension   . Hypertrophy of prostate   . Hypothyroidism   . Impotence of organic origin   . Morbid obesity ([place])   . Nephrolithiasis   . Other lymphedema   . Pancreatitis, acute   . Peripheral vascular disease ([place])   . Personal history of arthritis    Osteoarthritis  . Sleep apnea    uses cpap  . Unspecified septicemia(038.9) ([place])     Family History  Problem Relation Age of Onset  . Diabetes Mother   . Heart attack Mother   . Stroke Mother   . Colon cancer Father   . Dementia Father   . Heart attack Father   . Arthritis Paternal Grandmother   . Hypertension Other   . Hypothyroidism Other     Past Surgical History:  Procedure Laterality Date  . AMPUTATION  02/07/2011   Procedure: AMPUTATION BELOW KNEE;  Surgeon: Newt Minion, MD;  Location: [facility];  Service: Orthopedics;  Laterality: Left;  Left Below Knee Amputation  . bilteral hip     replacement & revison  's   . CYSTOSCOPY W/ RETROGRADES Bilateral 10/24/2012   Procedure: CYSTOSCOPY WITH RETROGRADE PYELOGRAM Procedure: Cystoscopy, Bilateral Retrogarde Pyelograms, Bladder Biopsy, Hydrodistension;  Surgeon: Franchot Gallo, MD;  [facility];  Service: Urology;  Laterality: Bilateral;  . KIDNEY SURGERY    . KNEE SURGERY     left knee    . LEG AMPUTATION BELOW KNEE    . REPLACEMENT TOTAL KNEE    . TONSILLECTOMY     Social History   Occupational History  . Not on file.   Social History Main Topics  . Smoking status: Never Smoker  . Smokeless tobacco: Never Used  . Alcohol use No  . Drug use: No  . Sexual activity: Yes

## 2016-05-18 ENCOUNTER — Telehealth: Payer: Self-pay | Admitting: *Deleted

## 2016-05-18 DIAGNOSIS — N186 End stage renal disease: Secondary | ICD-10-CM | POA: Diagnosis not present

## 2016-05-18 DIAGNOSIS — D509 Iron deficiency anemia, unspecified: Secondary | ICD-10-CM | POA: Diagnosis not present

## 2016-05-18 DIAGNOSIS — D631 Anemia in chronic kidney disease: Secondary | ICD-10-CM | POA: Diagnosis not present

## 2016-05-18 NOTE — Telephone Encounter (Signed)
Called and spoke with Myrene Galas with [facility].  Informed her that we received Plan of Care for the pt.  Informed her that the pt has been discharged from our practice, and the provider choose not to sign of on the Plan of Care.  She asked if the pt was seen in our office with in the certification period which was (04/11/16-06/09/16).  Informed her that the pt was last seen here by Dr. Nani Ravens on (03/20/16).  She checked the pt's chart to see where the referral started, which she found.  She asked if I would fax the Plan of Care back to her at [phone]), and she will have if faxed to the office who initiated the referral.  Faxed Plan of Care to Myrene Galas at [phone]) and confirmation received.//AB/CMA

## 2016-05-20 ENCOUNTER — Other Ambulatory Visit: Payer: Self-pay | Admitting: Family Medicine

## 2016-05-20 ENCOUNTER — Other Ambulatory Visit: Payer: Self-pay | Admitting: Physician Assistant

## 2016-05-20 DIAGNOSIS — D631 Anemia in chronic kidney disease: Secondary | ICD-10-CM | POA: Diagnosis not present

## 2016-05-20 DIAGNOSIS — D509 Iron deficiency anemia, unspecified: Secondary | ICD-10-CM | POA: Diagnosis not present

## 2016-05-20 DIAGNOSIS — N186 End stage renal disease: Secondary | ICD-10-CM | POA: Diagnosis not present

## 2016-05-20 DIAGNOSIS — Z09 Encounter for follow-up examination after completed treatment for conditions other than malignant neoplasm: Secondary | ICD-10-CM

## 2016-05-23 DIAGNOSIS — D509 Iron deficiency anemia, unspecified: Secondary | ICD-10-CM | POA: Diagnosis not present

## 2016-05-23 DIAGNOSIS — D631 Anemia in chronic kidney disease: Secondary | ICD-10-CM | POA: Diagnosis not present

## 2016-05-23 DIAGNOSIS — N186 End stage renal disease: Secondary | ICD-10-CM | POA: Diagnosis not present

## 2016-05-24 ENCOUNTER — Other Ambulatory Visit: Payer: Self-pay | Admitting: Physician Assistant

## 2016-05-25 DIAGNOSIS — D509 Iron deficiency anemia, unspecified: Secondary | ICD-10-CM | POA: Diagnosis not present

## 2016-05-25 DIAGNOSIS — N186 End stage renal disease: Secondary | ICD-10-CM | POA: Diagnosis not present

## 2016-05-25 DIAGNOSIS — D631 Anemia in chronic kidney disease: Secondary | ICD-10-CM | POA: Diagnosis not present

## 2016-05-26 ENCOUNTER — Other Ambulatory Visit: Payer: Self-pay | Admitting: Family Medicine

## 2016-05-26 DIAGNOSIS — Z09 Encounter for follow-up examination after completed treatment for conditions other than malignant neoplasm: Secondary | ICD-10-CM

## 2016-05-27 DIAGNOSIS — D631 Anemia in chronic kidney disease: Secondary | ICD-10-CM | POA: Diagnosis not present

## 2016-05-27 DIAGNOSIS — D509 Iron deficiency anemia, unspecified: Secondary | ICD-10-CM | POA: Diagnosis not present

## 2016-05-27 DIAGNOSIS — N186 End stage renal disease: Secondary | ICD-10-CM | POA: Diagnosis not present

## 2016-05-28 ENCOUNTER — Other Ambulatory Visit: Payer: Self-pay | Admitting: Physician Assistant

## 2016-05-28 DIAGNOSIS — N186 End stage renal disease: Secondary | ICD-10-CM | POA: Diagnosis not present

## 2016-05-28 DIAGNOSIS — I10 Essential (primary) hypertension: Secondary | ICD-10-CM | POA: Diagnosis not present

## 2016-05-28 DIAGNOSIS — D631 Anemia in chronic kidney disease: Secondary | ICD-10-CM | POA: Diagnosis not present

## 2016-05-29 NOTE — Telephone Encounter (Signed)
Rx denied.  Pt is no longer under Dr. Irene Limbo care.//AB/CMA

## 2016-05-30 DIAGNOSIS — D509 Iron deficiency anemia, unspecified: Secondary | ICD-10-CM | POA: Diagnosis not present

## 2016-05-30 DIAGNOSIS — N186 End stage renal disease: Secondary | ICD-10-CM | POA: Diagnosis not present

## 2016-05-30 DIAGNOSIS — N2581 Secondary hyperparathyroidism of renal origin: Secondary | ICD-10-CM | POA: Diagnosis not present

## 2016-05-30 DIAGNOSIS — D631 Anemia in chronic kidney disease: Secondary | ICD-10-CM | POA: Diagnosis not present

## 2016-05-31 ENCOUNTER — Other Ambulatory Visit: Payer: Self-pay | Admitting: Family Medicine

## 2016-05-31 ENCOUNTER — Other Ambulatory Visit: Payer: Self-pay | Admitting: Physician Assistant

## 2016-05-31 DIAGNOSIS — Z09 Encounter for follow-up examination after completed treatment for conditions other than malignant neoplasm: Secondary | ICD-10-CM

## 2016-06-01 DIAGNOSIS — N186 End stage renal disease: Secondary | ICD-10-CM | POA: Diagnosis not present

## 2016-06-01 DIAGNOSIS — D509 Iron deficiency anemia, unspecified: Secondary | ICD-10-CM | POA: Diagnosis not present

## 2016-06-01 DIAGNOSIS — D631 Anemia in chronic kidney disease: Secondary | ICD-10-CM | POA: Diagnosis not present

## 2016-06-01 DIAGNOSIS — E1165 Type 2 diabetes mellitus with hyperglycemia: Secondary | ICD-10-CM | POA: Diagnosis not present

## 2016-06-01 DIAGNOSIS — N2581 Secondary hyperparathyroidism of renal origin: Secondary | ICD-10-CM | POA: Diagnosis not present

## 2016-06-01 DIAGNOSIS — E039 Hypothyroidism, unspecified: Secondary | ICD-10-CM | POA: Diagnosis not present

## 2016-06-01 NOTE — Telephone Encounter (Signed)
Rx denied.  Patient no longer under providers care.//AB/CMA °

## 2016-06-03 DIAGNOSIS — D509 Iron deficiency anemia, unspecified: Secondary | ICD-10-CM | POA: Diagnosis not present

## 2016-06-03 DIAGNOSIS — D631 Anemia in chronic kidney disease: Secondary | ICD-10-CM | POA: Diagnosis not present

## 2016-06-03 DIAGNOSIS — N186 End stage renal disease: Secondary | ICD-10-CM | POA: Diagnosis not present

## 2016-06-03 DIAGNOSIS — N2581 Secondary hyperparathyroidism of renal origin: Secondary | ICD-10-CM | POA: Diagnosis not present

## 2016-06-06 DIAGNOSIS — D631 Anemia in chronic kidney disease: Secondary | ICD-10-CM | POA: Diagnosis not present

## 2016-06-06 DIAGNOSIS — N2581 Secondary hyperparathyroidism of renal origin: Secondary | ICD-10-CM | POA: Diagnosis not present

## 2016-06-06 DIAGNOSIS — D509 Iron deficiency anemia, unspecified: Secondary | ICD-10-CM | POA: Diagnosis not present

## 2016-06-06 DIAGNOSIS — N186 End stage renal disease: Secondary | ICD-10-CM | POA: Diagnosis not present

## 2016-06-07 ENCOUNTER — Ambulatory Visit (INDEPENDENT_AMBULATORY_CARE_PROVIDER_SITE_OTHER): Payer: BC Managed Care – PPO | Admitting: Orthopedic Surgery

## 2016-06-07 ENCOUNTER — Ambulatory Visit (INDEPENDENT_AMBULATORY_CARE_PROVIDER_SITE_OTHER): Payer: BC Managed Care – PPO | Admitting: Family

## 2016-06-07 ENCOUNTER — Encounter (INDEPENDENT_AMBULATORY_CARE_PROVIDER_SITE_OTHER): Payer: Self-pay | Admitting: Family

## 2016-06-07 VITALS — Ht 71.0 in | Wt 275.0 lb

## 2016-06-07 DIAGNOSIS — L97221 Non-pressure chronic ulcer of left calf limited to breakdown of skin: Secondary | ICD-10-CM

## 2016-06-07 DIAGNOSIS — Z89519 Acquired absence of unspecified leg below knee: Secondary | ICD-10-CM

## 2016-06-07 DIAGNOSIS — R2681 Unsteadiness on feet: Secondary | ICD-10-CM

## 2016-06-07 NOTE — Progress Notes (Signed)
Office Visit Note   Patient: [name]           Date of Birth: March 07, 1955           MRN: [id number] Visit Date: 06/07/2016              Requested by: No referring provider defined for this encounter. PCP: No PCP Per Patient  Chief Complaint  Patient presents with  . Left Leg - Follow-up    02/07/11 left below the knee amputation      HPI: The patient is a 61 year old gentleman who presents today for evaluation of his left below the knee amputation. He has had an ulceration distally from an bearing. Today is nonweightbearing in wheelchair. She has wearing a shrinker directly against the skin.  Assessment & Plan: Visit Diagnoses:  1. Non-pressure chronic ulcer of left calf, limited to breakdown of skin ([place])   2. S/P BKA (below knee amputation) unilateral (HCC)   3. Gait instability     Plan:  may resume weightbearing in a static as tolerated. As for this ulcer with any return ulceration discontinue the prosthetic and will return to the office for evaluation. Follow-Up Instructions: Return if symptoms worsen or fail to improve.   Ortho Exam  Patient is alert, oriented, no adenopathy, well-dressed, normal affect, normal respiratory effort. Him an bearing ulcer to the left below the knee amputation has fully epithelialized there is no erythema and induration no drainage no open areas no sign of infection  Imaging: No results found.  Labs: Lab Results  Component Value Date   HGBA1C 5.3 03/20/2016   HGBA1C 5.3 04/29/2015   HGBA1C 6.5 (A) 12/24/2013   ESRSEDRATE 57 (H) 06/13/2010   ESRSEDRATE 40 (H) 2020/09/2709   ESRSEDRATE 8 11/26/2008   CRP 4.8 (H) 2020/09/2709   CRP 0.6 (H) 11/26/2008   LABURIC 8.3 (H) 11/25/2015   LABURIC 6.2 10/10/2008   LABURIC 5.6 03/10/2008   REPTSTATUS 12/01/2015 FINAL 11/26/2015   GRAMSTAIN  08/16/2012    NO WBC SEEN MODERATE SQUAMOUS EPITHELIAL CELLS PRESENT MODERATE YEAST   CULT NO GROWTH 5 DAYS 11/26/2015   LABORGA ESCHERICHIA  COLI 08/03/2014    Orders:  No orders of the defined types were placed in this encounter.  No orders of the defined types were placed in this encounter.    Procedures: No procedures performed  Clinical Data: No additional findings.  ROS: Review of Systems  Constitutional: Negative for chills and fever.  Musculoskeletal: Positive for gait problem.  Skin: Negative for color change and wound.  Neurological: Positive for weakness. Negative for numbness.    Objective: Vital Signs: Ht 5\' 11"  (1.803 m)   Wt 275 lb (124.7 kg)   BMI 38.35 kg/m   Specialty Comments:  No specialty comments available.  PMFS History: Patient Active Problem List   Diagnosis Date Noted  . Non-pressure chronic ulcer of left calf, limited to breakdown of skin ([place]) 02/07/2016  . Hypotension, unspecified 11/24/2015  . Left knee pain 11/24/2015  . Hypotension 11/24/2015  . Decubitus ulcer of buttock 01/22/2015  . S/P BKA (below knee amputation) unilateral ([place]) 01/22/2015  . Chronic venous insufficiency 09/08/2014  . Diabetes mellitus, type 2 ([place]) 09/08/2014  . ESRD on dialysis ([place]) 09/08/2014  . Hyperlipidemia 12/23/2013  . Diabetic foot ([place]) 11/20/2013  . Chronic pain 05/21/2013  . Chronic interstitial cystitis 02/06/2013  . UI (urinary incontinence) 10/29/2012  . CKD (chronic kidney disease) stage 3, GFR 30-59 ml/min 08/18/2012  .  Hypothyroidism, acquired 08/04/2012  . OBESITY 09/21/2009  . Essential hypertension 09/21/2009  . DIVERTICULOSIS OF COLON 09/21/2009  . Type 2 diabetes mellitus with diabetic nephropathy, with long-term current use of insulin ([place]) 10/01/2007  . DVT 10/01/2007   Past Medical History:  Diagnosis Date  . Anemia   . Anxiety   . AR (allergic rhinitis)   . Arthritis    osteoarthritis  . Blood transfusion   . BPH (benign prostatic hypertrophy)   . Cholelithiasis   . Chronic kidney disease    hx of kidney stones  . Chronic pain   . Diabetes mellitus     insulin dependent  . Displacement of lumbar intervertebral disc without myelopathy    L4-L5  . Diverticulosis of colon   . DVT (deep venous thrombosis) (HCC)    Lower Extremity  . Embolism and thrombosis of unspecified artery   . Gallstone   . History of nephrolithiasis    Bilateral  . Hyperlipidemia   . Hypertension   . Hypertrophy of prostate   . Hypothyroidism   . Impotence of organic origin   . Morbid obesity ([place])   . Nephrolithiasis   . Other lymphedema   . Pancreatitis, acute   . Peripheral vascular disease ([place])   . Personal history of arthritis    Osteoarthritis  . Sleep apnea    uses cpap  . Unspecified septicemia(038.9) ([place])     Family History  Problem Relation Age of Onset  . Diabetes Mother   . Heart attack Mother   . Stroke Mother   . Colon cancer Father   . Dementia Father   . Heart attack Father   . Arthritis Paternal Grandmother   . Hypertension Other   . Hypothyroidism Other     Past Surgical History:  Procedure Laterality Date  . AMPUTATION  02/07/2011   Procedure: AMPUTATION BELOW KNEE;  Surgeon: Newt Minion, MD;  Location: [facility];  Service: Orthopedics;  Laterality: Left;  Left Below Knee Amputation  . bilteral hip     replacement & revison  's   . CYSTOSCOPY W/ RETROGRADES Bilateral 10/24/2012   Procedure: CYSTOSCOPY WITH RETROGRADE PYELOGRAM Procedure: Cystoscopy, Bilateral Retrogarde Pyelograms, Bladder Biopsy, Hydrodistension;  Surgeon: Franchot Gallo, MD;  Location: [facility];  Service: Urology;  Laterality: Bilateral;  . KIDNEY SURGERY    . KNEE SURGERY     left knee    . LEG AMPUTATION BELOW KNEE    . REPLACEMENT TOTAL KNEE    . TONSILLECTOMY     Social History   Occupational History  . Not on file.   Social History Main Topics  . Smoking status: Never Smoker  . Smokeless tobacco: Never Used  . Alcohol use No  . Drug use: No  . Sexual activity: Yes

## 2016-06-08 ENCOUNTER — Telehealth (INDEPENDENT_AMBULATORY_CARE_PROVIDER_SITE_OTHER): Payer: Self-pay | Admitting: Radiology

## 2016-06-08 DIAGNOSIS — D631 Anemia in chronic kidney disease: Secondary | ICD-10-CM | POA: Diagnosis not present

## 2016-06-08 DIAGNOSIS — N2581 Secondary hyperparathyroidism of renal origin: Secondary | ICD-10-CM | POA: Diagnosis not present

## 2016-06-08 DIAGNOSIS — N186 End stage renal disease: Secondary | ICD-10-CM | POA: Diagnosis not present

## 2016-06-08 DIAGNOSIS — D509 Iron deficiency anemia, unspecified: Secondary | ICD-10-CM | POA: Diagnosis not present

## 2016-06-08 NOTE — Telephone Encounter (Signed)
-----   Message from Suzan Slick, NP sent at 06/07/2016  1:13 PM EDT ----- Will you see if he can get some hh pt, prefers [facility], has used [facility] before, I put in the referral

## 2016-06-08 NOTE — Telephone Encounter (Signed)
Faxed referral to [phone]

## 2016-06-09 DIAGNOSIS — E1122 Type 2 diabetes mellitus with diabetic chronic kidney disease: Secondary | ICD-10-CM | POA: Diagnosis not present

## 2016-06-09 DIAGNOSIS — Z992 Dependence on renal dialysis: Secondary | ICD-10-CM | POA: Diagnosis not present

## 2016-06-09 DIAGNOSIS — Z794 Long term (current) use of insulin: Secondary | ICD-10-CM | POA: Diagnosis not present

## 2016-06-09 DIAGNOSIS — Z89512 Acquired absence of left leg below knee: Secondary | ICD-10-CM | POA: Diagnosis not present

## 2016-06-09 DIAGNOSIS — Z436 Encounter for attention to other artificial openings of urinary tract: Secondary | ICD-10-CM | POA: Diagnosis not present

## 2016-06-09 DIAGNOSIS — Z6836 Body mass index (BMI) 36.0-36.9, adult: Secondary | ICD-10-CM | POA: Diagnosis not present

## 2016-06-09 DIAGNOSIS — I120 Hypertensive chronic kidney disease with stage 5 chronic kidney disease or end stage renal disease: Secondary | ICD-10-CM | POA: Diagnosis not present

## 2016-06-09 DIAGNOSIS — N186 End stage renal disease: Secondary | ICD-10-CM | POA: Diagnosis not present

## 2016-06-09 DIAGNOSIS — E039 Hypothyroidism, unspecified: Secondary | ICD-10-CM | POA: Diagnosis not present

## 2016-06-10 DIAGNOSIS — N2581 Secondary hyperparathyroidism of renal origin: Secondary | ICD-10-CM | POA: Diagnosis not present

## 2016-06-10 DIAGNOSIS — D631 Anemia in chronic kidney disease: Secondary | ICD-10-CM | POA: Diagnosis not present

## 2016-06-10 DIAGNOSIS — N186 End stage renal disease: Secondary | ICD-10-CM | POA: Diagnosis not present

## 2016-06-10 DIAGNOSIS — D509 Iron deficiency anemia, unspecified: Secondary | ICD-10-CM | POA: Diagnosis not present

## 2016-06-13 DIAGNOSIS — N2581 Secondary hyperparathyroidism of renal origin: Secondary | ICD-10-CM | POA: Diagnosis not present

## 2016-06-13 DIAGNOSIS — D631 Anemia in chronic kidney disease: Secondary | ICD-10-CM | POA: Diagnosis not present

## 2016-06-13 DIAGNOSIS — N186 End stage renal disease: Secondary | ICD-10-CM | POA: Diagnosis not present

## 2016-06-13 DIAGNOSIS — E209 Hypoparathyroidism, unspecified: Secondary | ICD-10-CM | POA: Diagnosis not present

## 2016-06-13 DIAGNOSIS — E039 Hypothyroidism, unspecified: Secondary | ICD-10-CM | POA: Diagnosis not present

## 2016-06-13 DIAGNOSIS — D509 Iron deficiency anemia, unspecified: Secondary | ICD-10-CM | POA: Diagnosis not present

## 2016-06-15 DIAGNOSIS — D509 Iron deficiency anemia, unspecified: Secondary | ICD-10-CM | POA: Diagnosis not present

## 2016-06-15 DIAGNOSIS — N186 End stage renal disease: Secondary | ICD-10-CM | POA: Diagnosis not present

## 2016-06-15 DIAGNOSIS — N2581 Secondary hyperparathyroidism of renal origin: Secondary | ICD-10-CM | POA: Diagnosis not present

## 2016-06-15 DIAGNOSIS — D631 Anemia in chronic kidney disease: Secondary | ICD-10-CM | POA: Diagnosis not present

## 2016-06-16 ENCOUNTER — Telehealth (INDEPENDENT_AMBULATORY_CARE_PROVIDER_SITE_OTHER): Payer: Self-pay | Admitting: *Deleted

## 2016-06-16 DIAGNOSIS — Z89512 Acquired absence of left leg below knee: Secondary | ICD-10-CM | POA: Diagnosis not present

## 2016-06-16 DIAGNOSIS — E1122 Type 2 diabetes mellitus with diabetic chronic kidney disease: Secondary | ICD-10-CM | POA: Diagnosis not present

## 2016-06-16 DIAGNOSIS — R2689 Other abnormalities of gait and mobility: Secondary | ICD-10-CM | POA: Diagnosis not present

## 2016-06-16 DIAGNOSIS — Z992 Dependence on renal dialysis: Secondary | ICD-10-CM | POA: Diagnosis not present

## 2016-06-16 DIAGNOSIS — Z6838 Body mass index (BMI) 38.0-38.9, adult: Secondary | ICD-10-CM | POA: Diagnosis not present

## 2016-06-16 DIAGNOSIS — I120 Hypertensive chronic kidney disease with stage 5 chronic kidney disease or end stage renal disease: Secondary | ICD-10-CM | POA: Diagnosis not present

## 2016-06-16 DIAGNOSIS — E039 Hypothyroidism, unspecified: Secondary | ICD-10-CM | POA: Diagnosis not present

## 2016-06-16 DIAGNOSIS — E785 Hyperlipidemia, unspecified: Secondary | ICD-10-CM | POA: Diagnosis not present

## 2016-06-16 DIAGNOSIS — N186 End stage renal disease: Secondary | ICD-10-CM | POA: Diagnosis not present

## 2016-06-16 DIAGNOSIS — Z794 Long term (current) use of insulin: Secondary | ICD-10-CM | POA: Diagnosis not present

## 2016-06-16 DIAGNOSIS — K5790 Diverticulosis of intestine, part unspecified, without perforation or abscess without bleeding: Secondary | ICD-10-CM | POA: Diagnosis not present

## 2016-06-16 NOTE — Telephone Encounter (Signed)
Kristine from [facility] called this afternoon needing verbal orders. She needs PT orders for 1 time a week for one week and twice a week for 4 weeks, also for OT I time a week for one week, and also for a skilled nurse to access and social worker as well please. Thank you Her CB # [phone].

## 2016-06-17 DIAGNOSIS — N186 End stage renal disease: Secondary | ICD-10-CM | POA: Diagnosis not present

## 2016-06-17 DIAGNOSIS — D509 Iron deficiency anemia, unspecified: Secondary | ICD-10-CM | POA: Diagnosis not present

## 2016-06-17 DIAGNOSIS — N2581 Secondary hyperparathyroidism of renal origin: Secondary | ICD-10-CM | POA: Diagnosis not present

## 2016-06-17 DIAGNOSIS — D631 Anemia in chronic kidney disease: Secondary | ICD-10-CM | POA: Diagnosis not present

## 2016-06-20 DIAGNOSIS — N2581 Secondary hyperparathyroidism of renal origin: Secondary | ICD-10-CM | POA: Diagnosis not present

## 2016-06-20 DIAGNOSIS — N186 End stage renal disease: Secondary | ICD-10-CM | POA: Diagnosis not present

## 2016-06-20 DIAGNOSIS — D509 Iron deficiency anemia, unspecified: Secondary | ICD-10-CM | POA: Diagnosis not present

## 2016-06-20 DIAGNOSIS — D631 Anemia in chronic kidney disease: Secondary | ICD-10-CM | POA: Diagnosis not present

## 2016-06-20 NOTE — Telephone Encounter (Signed)
I called and sw [name] to give verbal ok for the orders below.

## 2016-06-21 DIAGNOSIS — I120 Hypertensive chronic kidney disease with stage 5 chronic kidney disease or end stage renal disease: Secondary | ICD-10-CM | POA: Diagnosis not present

## 2016-06-21 DIAGNOSIS — K5790 Diverticulosis of intestine, part unspecified, without perforation or abscess without bleeding: Secondary | ICD-10-CM | POA: Diagnosis not present

## 2016-06-21 DIAGNOSIS — E785 Hyperlipidemia, unspecified: Secondary | ICD-10-CM | POA: Diagnosis not present

## 2016-06-21 DIAGNOSIS — E1122 Type 2 diabetes mellitus with diabetic chronic kidney disease: Secondary | ICD-10-CM | POA: Diagnosis not present

## 2016-06-21 DIAGNOSIS — N186 End stage renal disease: Secondary | ICD-10-CM | POA: Diagnosis not present

## 2016-06-21 DIAGNOSIS — R2689 Other abnormalities of gait and mobility: Secondary | ICD-10-CM | POA: Diagnosis not present

## 2016-06-22 ENCOUNTER — Telehealth (INDEPENDENT_AMBULATORY_CARE_PROVIDER_SITE_OTHER): Payer: Self-pay | Admitting: Orthopedic Surgery

## 2016-06-22 DIAGNOSIS — D509 Iron deficiency anemia, unspecified: Secondary | ICD-10-CM | POA: Diagnosis not present

## 2016-06-22 DIAGNOSIS — N186 End stage renal disease: Secondary | ICD-10-CM | POA: Diagnosis not present

## 2016-06-22 DIAGNOSIS — D631 Anemia in chronic kidney disease: Secondary | ICD-10-CM | POA: Diagnosis not present

## 2016-06-22 DIAGNOSIS — N2581 Secondary hyperparathyroidism of renal origin: Secondary | ICD-10-CM | POA: Diagnosis not present

## 2016-06-22 NOTE — Telephone Encounter (Signed)
[name]  (OT) with [facility] called needing verbal orders to see patient 4 more times. 1 day a wk. for ADL training and Strengthening. The number to contact patient is [phone]

## 2016-06-22 NOTE — Telephone Encounter (Signed)
IC and advised ok for orders.

## 2016-06-23 DIAGNOSIS — E785 Hyperlipidemia, unspecified: Secondary | ICD-10-CM | POA: Diagnosis not present

## 2016-06-23 DIAGNOSIS — R2689 Other abnormalities of gait and mobility: Secondary | ICD-10-CM | POA: Diagnosis not present

## 2016-06-23 DIAGNOSIS — N186 End stage renal disease: Secondary | ICD-10-CM | POA: Diagnosis not present

## 2016-06-23 DIAGNOSIS — E1122 Type 2 diabetes mellitus with diabetic chronic kidney disease: Secondary | ICD-10-CM | POA: Diagnosis not present

## 2016-06-23 DIAGNOSIS — K5790 Diverticulosis of intestine, part unspecified, without perforation or abscess without bleeding: Secondary | ICD-10-CM | POA: Diagnosis not present

## 2016-06-23 DIAGNOSIS — I120 Hypertensive chronic kidney disease with stage 5 chronic kidney disease or end stage renal disease: Secondary | ICD-10-CM | POA: Diagnosis not present

## 2016-06-24 DIAGNOSIS — N186 End stage renal disease: Secondary | ICD-10-CM | POA: Diagnosis not present

## 2016-06-24 DIAGNOSIS — D631 Anemia in chronic kidney disease: Secondary | ICD-10-CM | POA: Diagnosis not present

## 2016-06-24 DIAGNOSIS — N2581 Secondary hyperparathyroidism of renal origin: Secondary | ICD-10-CM | POA: Diagnosis not present

## 2016-06-24 DIAGNOSIS — D509 Iron deficiency anemia, unspecified: Secondary | ICD-10-CM | POA: Diagnosis not present

## 2016-06-27 DIAGNOSIS — N186 End stage renal disease: Secondary | ICD-10-CM | POA: Diagnosis not present

## 2016-06-27 DIAGNOSIS — I10 Essential (primary) hypertension: Secondary | ICD-10-CM | POA: Diagnosis not present

## 2016-06-27 DIAGNOSIS — D631 Anemia in chronic kidney disease: Secondary | ICD-10-CM | POA: Diagnosis not present

## 2016-06-28 DIAGNOSIS — E785 Hyperlipidemia, unspecified: Secondary | ICD-10-CM | POA: Diagnosis not present

## 2016-06-28 DIAGNOSIS — R2689 Other abnormalities of gait and mobility: Secondary | ICD-10-CM | POA: Diagnosis not present

## 2016-06-28 DIAGNOSIS — N186 End stage renal disease: Secondary | ICD-10-CM | POA: Diagnosis not present

## 2016-06-28 DIAGNOSIS — E1122 Type 2 diabetes mellitus with diabetic chronic kidney disease: Secondary | ICD-10-CM | POA: Diagnosis not present

## 2016-06-28 DIAGNOSIS — I120 Hypertensive chronic kidney disease with stage 5 chronic kidney disease or end stage renal disease: Secondary | ICD-10-CM | POA: Diagnosis not present

## 2016-06-28 DIAGNOSIS — K5790 Diverticulosis of intestine, part unspecified, without perforation or abscess without bleeding: Secondary | ICD-10-CM | POA: Diagnosis not present

## 2016-06-29 DIAGNOSIS — N186 End stage renal disease: Secondary | ICD-10-CM | POA: Diagnosis not present

## 2016-06-29 DIAGNOSIS — D631 Anemia in chronic kidney disease: Secondary | ICD-10-CM | POA: Diagnosis not present

## 2016-06-29 DIAGNOSIS — E1165 Type 2 diabetes mellitus with hyperglycemia: Secondary | ICD-10-CM | POA: Diagnosis not present

## 2016-06-30 DIAGNOSIS — N186 End stage renal disease: Secondary | ICD-10-CM | POA: Diagnosis not present

## 2016-06-30 DIAGNOSIS — R2689 Other abnormalities of gait and mobility: Secondary | ICD-10-CM | POA: Diagnosis not present

## 2016-06-30 DIAGNOSIS — I120 Hypertensive chronic kidney disease with stage 5 chronic kidney disease or end stage renal disease: Secondary | ICD-10-CM | POA: Diagnosis not present

## 2016-06-30 DIAGNOSIS — E1122 Type 2 diabetes mellitus with diabetic chronic kidney disease: Secondary | ICD-10-CM | POA: Diagnosis not present

## 2016-06-30 DIAGNOSIS — E785 Hyperlipidemia, unspecified: Secondary | ICD-10-CM | POA: Diagnosis not present

## 2016-06-30 DIAGNOSIS — K5790 Diverticulosis of intestine, part unspecified, without perforation or abscess without bleeding: Secondary | ICD-10-CM | POA: Diagnosis not present

## 2016-07-01 DIAGNOSIS — N186 End stage renal disease: Secondary | ICD-10-CM | POA: Diagnosis not present

## 2016-07-01 DIAGNOSIS — D631 Anemia in chronic kidney disease: Secondary | ICD-10-CM | POA: Diagnosis not present

## 2016-07-03 DIAGNOSIS — K5790 Diverticulosis of intestine, part unspecified, without perforation or abscess without bleeding: Secondary | ICD-10-CM | POA: Diagnosis not present

## 2016-07-03 DIAGNOSIS — E785 Hyperlipidemia, unspecified: Secondary | ICD-10-CM | POA: Diagnosis not present

## 2016-07-03 DIAGNOSIS — R2689 Other abnormalities of gait and mobility: Secondary | ICD-10-CM | POA: Diagnosis not present

## 2016-07-03 DIAGNOSIS — I120 Hypertensive chronic kidney disease with stage 5 chronic kidney disease or end stage renal disease: Secondary | ICD-10-CM | POA: Diagnosis not present

## 2016-07-03 DIAGNOSIS — N186 End stage renal disease: Secondary | ICD-10-CM | POA: Diagnosis not present

## 2016-07-03 DIAGNOSIS — E1122 Type 2 diabetes mellitus with diabetic chronic kidney disease: Secondary | ICD-10-CM | POA: Diagnosis not present

## 2016-07-04 DIAGNOSIS — D631 Anemia in chronic kidney disease: Secondary | ICD-10-CM | POA: Diagnosis not present

## 2016-07-04 DIAGNOSIS — N186 End stage renal disease: Secondary | ICD-10-CM | POA: Diagnosis not present

## 2016-07-05 DIAGNOSIS — K5790 Diverticulosis of intestine, part unspecified, without perforation or abscess without bleeding: Secondary | ICD-10-CM | POA: Diagnosis not present

## 2016-07-05 DIAGNOSIS — I120 Hypertensive chronic kidney disease with stage 5 chronic kidney disease or end stage renal disease: Secondary | ICD-10-CM | POA: Diagnosis not present

## 2016-07-05 DIAGNOSIS — E1122 Type 2 diabetes mellitus with diabetic chronic kidney disease: Secondary | ICD-10-CM | POA: Diagnosis not present

## 2016-07-05 DIAGNOSIS — R2689 Other abnormalities of gait and mobility: Secondary | ICD-10-CM | POA: Diagnosis not present

## 2016-07-05 DIAGNOSIS — N186 End stage renal disease: Secondary | ICD-10-CM | POA: Diagnosis not present

## 2016-07-05 DIAGNOSIS — E785 Hyperlipidemia, unspecified: Secondary | ICD-10-CM | POA: Diagnosis not present

## 2016-07-06 DIAGNOSIS — N186 End stage renal disease: Secondary | ICD-10-CM | POA: Diagnosis not present

## 2016-07-06 DIAGNOSIS — D631 Anemia in chronic kidney disease: Secondary | ICD-10-CM | POA: Diagnosis not present

## 2016-07-07 ENCOUNTER — Telehealth (INDEPENDENT_AMBULATORY_CARE_PROVIDER_SITE_OTHER): Payer: Self-pay | Admitting: Orthopedic Surgery

## 2016-07-08 DIAGNOSIS — D631 Anemia in chronic kidney disease: Secondary | ICD-10-CM | POA: Diagnosis not present

## 2016-07-08 DIAGNOSIS — N186 End stage renal disease: Secondary | ICD-10-CM | POA: Diagnosis not present

## 2016-07-10 DIAGNOSIS — R2689 Other abnormalities of gait and mobility: Secondary | ICD-10-CM | POA: Diagnosis not present

## 2016-07-10 DIAGNOSIS — K5790 Diverticulosis of intestine, part unspecified, without perforation or abscess without bleeding: Secondary | ICD-10-CM | POA: Diagnosis not present

## 2016-07-10 DIAGNOSIS — I120 Hypertensive chronic kidney disease with stage 5 chronic kidney disease or end stage renal disease: Secondary | ICD-10-CM | POA: Diagnosis not present

## 2016-07-10 DIAGNOSIS — E1122 Type 2 diabetes mellitus with diabetic chronic kidney disease: Secondary | ICD-10-CM | POA: Diagnosis not present

## 2016-07-10 DIAGNOSIS — N186 End stage renal disease: Secondary | ICD-10-CM | POA: Diagnosis not present

## 2016-07-10 DIAGNOSIS — E785 Hyperlipidemia, unspecified: Secondary | ICD-10-CM | POA: Diagnosis not present

## 2016-07-11 DIAGNOSIS — D631 Anemia in chronic kidney disease: Secondary | ICD-10-CM | POA: Diagnosis not present

## 2016-07-11 DIAGNOSIS — N186 End stage renal disease: Secondary | ICD-10-CM | POA: Diagnosis not present

## 2016-07-12 DIAGNOSIS — I120 Hypertensive chronic kidney disease with stage 5 chronic kidney disease or end stage renal disease: Secondary | ICD-10-CM | POA: Diagnosis not present

## 2016-07-12 DIAGNOSIS — E1122 Type 2 diabetes mellitus with diabetic chronic kidney disease: Secondary | ICD-10-CM | POA: Diagnosis not present

## 2016-07-12 DIAGNOSIS — K5790 Diverticulosis of intestine, part unspecified, without perforation or abscess without bleeding: Secondary | ICD-10-CM | POA: Diagnosis not present

## 2016-07-12 DIAGNOSIS — N186 End stage renal disease: Secondary | ICD-10-CM | POA: Diagnosis not present

## 2016-07-12 DIAGNOSIS — E785 Hyperlipidemia, unspecified: Secondary | ICD-10-CM | POA: Diagnosis not present

## 2016-07-12 DIAGNOSIS — R2689 Other abnormalities of gait and mobility: Secondary | ICD-10-CM | POA: Diagnosis not present

## 2016-07-13 ENCOUNTER — Telehealth (INDEPENDENT_AMBULATORY_CARE_PROVIDER_SITE_OTHER): Payer: Self-pay | Admitting: Orthopedic Surgery

## 2016-07-13 DIAGNOSIS — D631 Anemia in chronic kidney disease: Secondary | ICD-10-CM | POA: Diagnosis not present

## 2016-07-13 DIAGNOSIS — N186 End stage renal disease: Secondary | ICD-10-CM | POA: Diagnosis not present

## 2016-07-13 NOTE — Telephone Encounter (Signed)
[employer] [facility] request verbal order for: 1 time a week for 4 weeks

## 2016-07-13 NOTE — Telephone Encounter (Signed)
I called to speak with Ashanti to give verbal order for 1x week for 4 weeks.

## 2016-07-14 ENCOUNTER — Other Ambulatory Visit: Payer: Self-pay | Admitting: Family Medicine

## 2016-07-14 DIAGNOSIS — N186 End stage renal disease: Secondary | ICD-10-CM | POA: Diagnosis not present

## 2016-07-14 DIAGNOSIS — R2689 Other abnormalities of gait and mobility: Secondary | ICD-10-CM | POA: Diagnosis not present

## 2016-07-14 DIAGNOSIS — K5790 Diverticulosis of intestine, part unspecified, without perforation or abscess without bleeding: Secondary | ICD-10-CM | POA: Diagnosis not present

## 2016-07-14 DIAGNOSIS — E1122 Type 2 diabetes mellitus with diabetic chronic kidney disease: Secondary | ICD-10-CM | POA: Diagnosis not present

## 2016-07-14 DIAGNOSIS — E785 Hyperlipidemia, unspecified: Secondary | ICD-10-CM | POA: Diagnosis not present

## 2016-07-14 DIAGNOSIS — I120 Hypertensive chronic kidney disease with stage 5 chronic kidney disease or end stage renal disease: Secondary | ICD-10-CM | POA: Diagnosis not present

## 2016-07-15 ENCOUNTER — Other Ambulatory Visit: Payer: Self-pay | Admitting: Family Medicine

## 2016-07-15 DIAGNOSIS — E1122 Type 2 diabetes mellitus with diabetic chronic kidney disease: Secondary | ICD-10-CM | POA: Diagnosis not present

## 2016-07-15 DIAGNOSIS — E785 Hyperlipidemia, unspecified: Secondary | ICD-10-CM | POA: Diagnosis not present

## 2016-07-15 DIAGNOSIS — D631 Anemia in chronic kidney disease: Secondary | ICD-10-CM | POA: Diagnosis not present

## 2016-07-15 DIAGNOSIS — K5790 Diverticulosis of intestine, part unspecified, without perforation or abscess without bleeding: Secondary | ICD-10-CM | POA: Diagnosis not present

## 2016-07-15 DIAGNOSIS — R2689 Other abnormalities of gait and mobility: Secondary | ICD-10-CM | POA: Diagnosis not present

## 2016-07-15 DIAGNOSIS — I120 Hypertensive chronic kidney disease with stage 5 chronic kidney disease or end stage renal disease: Secondary | ICD-10-CM | POA: Diagnosis not present

## 2016-07-15 DIAGNOSIS — N186 End stage renal disease: Secondary | ICD-10-CM | POA: Diagnosis not present

## 2016-07-17 ENCOUNTER — Telehealth (INDEPENDENT_AMBULATORY_CARE_PROVIDER_SITE_OTHER): Payer: Self-pay | Admitting: Orthopedic Surgery

## 2016-07-17 DIAGNOSIS — E1122 Type 2 diabetes mellitus with diabetic chronic kidney disease: Secondary | ICD-10-CM | POA: Diagnosis not present

## 2016-07-17 DIAGNOSIS — K5790 Diverticulosis of intestine, part unspecified, without perforation or abscess without bleeding: Secondary | ICD-10-CM | POA: Diagnosis not present

## 2016-07-17 DIAGNOSIS — N186 End stage renal disease: Secondary | ICD-10-CM | POA: Diagnosis not present

## 2016-07-17 DIAGNOSIS — R2689 Other abnormalities of gait and mobility: Secondary | ICD-10-CM | POA: Diagnosis not present

## 2016-07-17 DIAGNOSIS — I120 Hypertensive chronic kidney disease with stage 5 chronic kidney disease or end stage renal disease: Secondary | ICD-10-CM | POA: Diagnosis not present

## 2016-07-17 DIAGNOSIS — E785 Hyperlipidemia, unspecified: Secondary | ICD-10-CM | POA: Diagnosis not present

## 2016-07-17 NOTE — Telephone Encounter (Signed)
Rosann Auerbach called wanting to add an extension to the patients therapy. His last PT appointment was today but she thinks he needs more therapy. CB # [phone]

## 2016-07-17 NOTE — Telephone Encounter (Signed)
Rx denied.  Pt is no longer under providers care.//AB/CMA

## 2016-07-17 NOTE — Telephone Encounter (Signed)
Rx denied.  Pt no longer under providers care.//AB/CMA

## 2016-07-17 NOTE — Telephone Encounter (Signed)
Called and sw Rosann Auerbach to advise ok for extension of OT services.

## 2016-07-18 DIAGNOSIS — D631 Anemia in chronic kidney disease: Secondary | ICD-10-CM | POA: Diagnosis not present

## 2016-07-18 DIAGNOSIS — N186 End stage renal disease: Secondary | ICD-10-CM | POA: Diagnosis not present

## 2016-07-19 DIAGNOSIS — I120 Hypertensive chronic kidney disease with stage 5 chronic kidney disease or end stage renal disease: Secondary | ICD-10-CM | POA: Diagnosis not present

## 2016-07-19 DIAGNOSIS — N186 End stage renal disease: Secondary | ICD-10-CM | POA: Diagnosis not present

## 2016-07-19 DIAGNOSIS — E785 Hyperlipidemia, unspecified: Secondary | ICD-10-CM | POA: Diagnosis not present

## 2016-07-19 DIAGNOSIS — R2689 Other abnormalities of gait and mobility: Secondary | ICD-10-CM | POA: Diagnosis not present

## 2016-07-19 DIAGNOSIS — K5790 Diverticulosis of intestine, part unspecified, without perforation or abscess without bleeding: Secondary | ICD-10-CM | POA: Diagnosis not present

## 2016-07-19 DIAGNOSIS — E1122 Type 2 diabetes mellitus with diabetic chronic kidney disease: Secondary | ICD-10-CM | POA: Diagnosis not present

## 2016-07-20 ENCOUNTER — Telehealth (INDEPENDENT_AMBULATORY_CARE_PROVIDER_SITE_OTHER): Payer: Self-pay

## 2016-07-20 DIAGNOSIS — D631 Anemia in chronic kidney disease: Secondary | ICD-10-CM | POA: Diagnosis not present

## 2016-07-20 DIAGNOSIS — N186 End stage renal disease: Secondary | ICD-10-CM | POA: Diagnosis not present

## 2016-07-20 NOTE — Telephone Encounter (Signed)
Patty with [facility] want ed to let Dr. Sharol Given know that she was not able to reach patient.  CB# is [phone].

## 2016-07-22 DIAGNOSIS — D631 Anemia in chronic kidney disease: Secondary | ICD-10-CM | POA: Diagnosis not present

## 2016-07-22 DIAGNOSIS — N186 End stage renal disease: Secondary | ICD-10-CM | POA: Diagnosis not present

## 2016-07-24 DIAGNOSIS — E1122 Type 2 diabetes mellitus with diabetic chronic kidney disease: Secondary | ICD-10-CM | POA: Diagnosis not present

## 2016-07-24 DIAGNOSIS — E785 Hyperlipidemia, unspecified: Secondary | ICD-10-CM | POA: Diagnosis not present

## 2016-07-24 DIAGNOSIS — N186 End stage renal disease: Secondary | ICD-10-CM | POA: Diagnosis not present

## 2016-07-24 DIAGNOSIS — I120 Hypertensive chronic kidney disease with stage 5 chronic kidney disease or end stage renal disease: Secondary | ICD-10-CM | POA: Diagnosis not present

## 2016-07-24 DIAGNOSIS — R2689 Other abnormalities of gait and mobility: Secondary | ICD-10-CM | POA: Diagnosis not present

## 2016-07-24 DIAGNOSIS — K5790 Diverticulosis of intestine, part unspecified, without perforation or abscess without bleeding: Secondary | ICD-10-CM | POA: Diagnosis not present

## 2016-07-25 DIAGNOSIS — N186 End stage renal disease: Secondary | ICD-10-CM | POA: Diagnosis not present

## 2016-07-25 DIAGNOSIS — D631 Anemia in chronic kidney disease: Secondary | ICD-10-CM | POA: Diagnosis not present

## 2016-07-27 DIAGNOSIS — N186 End stage renal disease: Secondary | ICD-10-CM | POA: Diagnosis not present

## 2016-07-27 DIAGNOSIS — D631 Anemia in chronic kidney disease: Secondary | ICD-10-CM | POA: Diagnosis not present

## 2016-07-28 DIAGNOSIS — I10 Essential (primary) hypertension: Secondary | ICD-10-CM | POA: Diagnosis not present

## 2016-07-28 DIAGNOSIS — E785 Hyperlipidemia, unspecified: Secondary | ICD-10-CM | POA: Diagnosis not present

## 2016-07-28 DIAGNOSIS — R2689 Other abnormalities of gait and mobility: Secondary | ICD-10-CM | POA: Diagnosis not present

## 2016-07-28 DIAGNOSIS — N186 End stage renal disease: Secondary | ICD-10-CM | POA: Diagnosis not present

## 2016-07-28 DIAGNOSIS — D631 Anemia in chronic kidney disease: Secondary | ICD-10-CM | POA: Diagnosis not present

## 2016-07-28 DIAGNOSIS — I120 Hypertensive chronic kidney disease with stage 5 chronic kidney disease or end stage renal disease: Secondary | ICD-10-CM | POA: Diagnosis not present

## 2016-07-28 DIAGNOSIS — E1122 Type 2 diabetes mellitus with diabetic chronic kidney disease: Secondary | ICD-10-CM | POA: Diagnosis not present

## 2016-07-28 DIAGNOSIS — K5790 Diverticulosis of intestine, part unspecified, without perforation or abscess without bleeding: Secondary | ICD-10-CM | POA: Diagnosis not present

## 2016-07-29 DIAGNOSIS — D631 Anemia in chronic kidney disease: Secondary | ICD-10-CM | POA: Diagnosis not present

## 2016-07-29 DIAGNOSIS — N186 End stage renal disease: Secondary | ICD-10-CM | POA: Diagnosis not present

## 2016-07-31 DIAGNOSIS — K5790 Diverticulosis of intestine, part unspecified, without perforation or abscess without bleeding: Secondary | ICD-10-CM | POA: Diagnosis not present

## 2016-07-31 DIAGNOSIS — N186 End stage renal disease: Secondary | ICD-10-CM | POA: Diagnosis not present

## 2016-07-31 DIAGNOSIS — E785 Hyperlipidemia, unspecified: Secondary | ICD-10-CM | POA: Diagnosis not present

## 2016-07-31 DIAGNOSIS — I120 Hypertensive chronic kidney disease with stage 5 chronic kidney disease or end stage renal disease: Secondary | ICD-10-CM | POA: Diagnosis not present

## 2016-07-31 DIAGNOSIS — E1122 Type 2 diabetes mellitus with diabetic chronic kidney disease: Secondary | ICD-10-CM | POA: Diagnosis not present

## 2016-07-31 DIAGNOSIS — R2689 Other abnormalities of gait and mobility: Secondary | ICD-10-CM | POA: Diagnosis not present

## 2016-08-01 DIAGNOSIS — D631 Anemia in chronic kidney disease: Secondary | ICD-10-CM | POA: Diagnosis not present

## 2016-08-01 DIAGNOSIS — N186 End stage renal disease: Secondary | ICD-10-CM | POA: Diagnosis not present

## 2016-08-02 DIAGNOSIS — I120 Hypertensive chronic kidney disease with stage 5 chronic kidney disease or end stage renal disease: Secondary | ICD-10-CM | POA: Diagnosis not present

## 2016-08-02 DIAGNOSIS — R2689 Other abnormalities of gait and mobility: Secondary | ICD-10-CM | POA: Diagnosis not present

## 2016-08-02 DIAGNOSIS — E1122 Type 2 diabetes mellitus with diabetic chronic kidney disease: Secondary | ICD-10-CM | POA: Diagnosis not present

## 2016-08-02 DIAGNOSIS — K5790 Diverticulosis of intestine, part unspecified, without perforation or abscess without bleeding: Secondary | ICD-10-CM | POA: Diagnosis not present

## 2016-08-02 DIAGNOSIS — N186 End stage renal disease: Secondary | ICD-10-CM | POA: Diagnosis not present

## 2016-08-02 DIAGNOSIS — E785 Hyperlipidemia, unspecified: Secondary | ICD-10-CM | POA: Diagnosis not present

## 2016-08-03 DIAGNOSIS — N186 End stage renal disease: Secondary | ICD-10-CM | POA: Diagnosis not present

## 2016-08-03 DIAGNOSIS — D631 Anemia in chronic kidney disease: Secondary | ICD-10-CM | POA: Diagnosis not present

## 2016-08-03 DIAGNOSIS — E1165 Type 2 diabetes mellitus with hyperglycemia: Secondary | ICD-10-CM | POA: Diagnosis not present

## 2016-08-03 DIAGNOSIS — E1121 Type 2 diabetes mellitus with diabetic nephropathy: Secondary | ICD-10-CM | POA: Diagnosis not present

## 2016-08-04 DIAGNOSIS — E785 Hyperlipidemia, unspecified: Secondary | ICD-10-CM | POA: Diagnosis not present

## 2016-08-04 DIAGNOSIS — E1122 Type 2 diabetes mellitus with diabetic chronic kidney disease: Secondary | ICD-10-CM | POA: Diagnosis not present

## 2016-08-04 DIAGNOSIS — K5790 Diverticulosis of intestine, part unspecified, without perforation or abscess without bleeding: Secondary | ICD-10-CM | POA: Diagnosis not present

## 2016-08-04 DIAGNOSIS — N186 End stage renal disease: Secondary | ICD-10-CM | POA: Diagnosis not present

## 2016-08-04 DIAGNOSIS — R2689 Other abnormalities of gait and mobility: Secondary | ICD-10-CM | POA: Diagnosis not present

## 2016-08-04 DIAGNOSIS — I120 Hypertensive chronic kidney disease with stage 5 chronic kidney disease or end stage renal disease: Secondary | ICD-10-CM | POA: Diagnosis not present

## 2016-08-05 DIAGNOSIS — D631 Anemia in chronic kidney disease: Secondary | ICD-10-CM | POA: Diagnosis not present

## 2016-08-05 DIAGNOSIS — N186 End stage renal disease: Secondary | ICD-10-CM | POA: Diagnosis not present

## 2016-08-07 DIAGNOSIS — E1122 Type 2 diabetes mellitus with diabetic chronic kidney disease: Secondary | ICD-10-CM | POA: Diagnosis not present

## 2016-08-07 DIAGNOSIS — K5790 Diverticulosis of intestine, part unspecified, without perforation or abscess without bleeding: Secondary | ICD-10-CM | POA: Diagnosis not present

## 2016-08-07 DIAGNOSIS — N186 End stage renal disease: Secondary | ICD-10-CM | POA: Diagnosis not present

## 2016-08-07 DIAGNOSIS — I120 Hypertensive chronic kidney disease with stage 5 chronic kidney disease or end stage renal disease: Secondary | ICD-10-CM | POA: Diagnosis not present

## 2016-08-07 DIAGNOSIS — R2689 Other abnormalities of gait and mobility: Secondary | ICD-10-CM | POA: Diagnosis not present

## 2016-08-07 DIAGNOSIS — E785 Hyperlipidemia, unspecified: Secondary | ICD-10-CM | POA: Diagnosis not present

## 2016-08-08 DIAGNOSIS — D631 Anemia in chronic kidney disease: Secondary | ICD-10-CM | POA: Diagnosis not present

## 2016-08-08 DIAGNOSIS — N186 End stage renal disease: Secondary | ICD-10-CM | POA: Diagnosis not present

## 2016-08-09 DIAGNOSIS — I120 Hypertensive chronic kidney disease with stage 5 chronic kidney disease or end stage renal disease: Secondary | ICD-10-CM | POA: Diagnosis not present

## 2016-08-09 DIAGNOSIS — E785 Hyperlipidemia, unspecified: Secondary | ICD-10-CM | POA: Diagnosis not present

## 2016-08-09 DIAGNOSIS — K5790 Diverticulosis of intestine, part unspecified, without perforation or abscess without bleeding: Secondary | ICD-10-CM | POA: Diagnosis not present

## 2016-08-09 DIAGNOSIS — R2689 Other abnormalities of gait and mobility: Secondary | ICD-10-CM | POA: Diagnosis not present

## 2016-08-09 DIAGNOSIS — N186 End stage renal disease: Secondary | ICD-10-CM | POA: Diagnosis not present

## 2016-08-09 DIAGNOSIS — E1122 Type 2 diabetes mellitus with diabetic chronic kidney disease: Secondary | ICD-10-CM | POA: Diagnosis not present

## 2016-08-10 DIAGNOSIS — D631 Anemia in chronic kidney disease: Secondary | ICD-10-CM | POA: Diagnosis not present

## 2016-08-10 DIAGNOSIS — N186 End stage renal disease: Secondary | ICD-10-CM | POA: Diagnosis not present

## 2016-08-12 DIAGNOSIS — N186 End stage renal disease: Secondary | ICD-10-CM | POA: Diagnosis not present

## 2016-08-12 DIAGNOSIS — D631 Anemia in chronic kidney disease: Secondary | ICD-10-CM | POA: Diagnosis not present

## 2016-08-15 DIAGNOSIS — D631 Anemia in chronic kidney disease: Secondary | ICD-10-CM | POA: Diagnosis not present

## 2016-08-15 DIAGNOSIS — N186 End stage renal disease: Secondary | ICD-10-CM | POA: Diagnosis not present

## 2016-08-17 DIAGNOSIS — N186 End stage renal disease: Secondary | ICD-10-CM | POA: Diagnosis not present

## 2016-08-17 DIAGNOSIS — D631 Anemia in chronic kidney disease: Secondary | ICD-10-CM | POA: Diagnosis not present

## 2016-08-19 DIAGNOSIS — N186 End stage renal disease: Secondary | ICD-10-CM | POA: Diagnosis not present

## 2016-08-19 DIAGNOSIS — D631 Anemia in chronic kidney disease: Secondary | ICD-10-CM | POA: Diagnosis not present

## 2016-08-22 DIAGNOSIS — N186 End stage renal disease: Secondary | ICD-10-CM | POA: Diagnosis not present

## 2016-08-22 DIAGNOSIS — D631 Anemia in chronic kidney disease: Secondary | ICD-10-CM | POA: Diagnosis not present

## 2016-08-24 DIAGNOSIS — N186 End stage renal disease: Secondary | ICD-10-CM | POA: Diagnosis not present

## 2016-08-24 DIAGNOSIS — D631 Anemia in chronic kidney disease: Secondary | ICD-10-CM | POA: Diagnosis not present

## 2016-08-26 DIAGNOSIS — N186 End stage renal disease: Secondary | ICD-10-CM | POA: Diagnosis not present

## 2016-08-26 DIAGNOSIS — D631 Anemia in chronic kidney disease: Secondary | ICD-10-CM | POA: Diagnosis not present

## 2016-08-27 DIAGNOSIS — I10 Essential (primary) hypertension: Secondary | ICD-10-CM | POA: Diagnosis not present

## 2016-08-27 DIAGNOSIS — N186 End stage renal disease: Secondary | ICD-10-CM | POA: Diagnosis not present

## 2016-08-27 DIAGNOSIS — D631 Anemia in chronic kidney disease: Secondary | ICD-10-CM | POA: Diagnosis not present

## 2016-08-29 DIAGNOSIS — N186 End stage renal disease: Secondary | ICD-10-CM | POA: Diagnosis not present

## 2016-08-29 DIAGNOSIS — N2581 Secondary hyperparathyroidism of renal origin: Secondary | ICD-10-CM | POA: Diagnosis not present

## 2016-08-29 DIAGNOSIS — D631 Anemia in chronic kidney disease: Secondary | ICD-10-CM | POA: Diagnosis not present

## 2016-08-31 DIAGNOSIS — N2581 Secondary hyperparathyroidism of renal origin: Secondary | ICD-10-CM | POA: Diagnosis not present

## 2016-08-31 DIAGNOSIS — E039 Hypothyroidism, unspecified: Secondary | ICD-10-CM | POA: Diagnosis not present

## 2016-08-31 DIAGNOSIS — E1165 Type 2 diabetes mellitus with hyperglycemia: Secondary | ICD-10-CM | POA: Diagnosis not present

## 2016-08-31 DIAGNOSIS — N186 End stage renal disease: Secondary | ICD-10-CM | POA: Diagnosis not present

## 2016-08-31 DIAGNOSIS — D631 Anemia in chronic kidney disease: Secondary | ICD-10-CM | POA: Diagnosis not present

## 2016-09-01 ENCOUNTER — Other Ambulatory Visit: Payer: Self-pay | Admitting: Physician Assistant

## 2016-09-02 DIAGNOSIS — N186 End stage renal disease: Secondary | ICD-10-CM | POA: Diagnosis not present

## 2016-09-02 DIAGNOSIS — D631 Anemia in chronic kidney disease: Secondary | ICD-10-CM | POA: Diagnosis not present

## 2016-09-02 DIAGNOSIS — N2581 Secondary hyperparathyroidism of renal origin: Secondary | ICD-10-CM | POA: Diagnosis not present

## 2016-09-05 DIAGNOSIS — D631 Anemia in chronic kidney disease: Secondary | ICD-10-CM | POA: Diagnosis not present

## 2016-09-05 DIAGNOSIS — M25561 Pain in right knee: Secondary | ICD-10-CM | POA: Diagnosis not present

## 2016-09-05 DIAGNOSIS — Z6838 Body mass index (BMI) 38.0-38.9, adult: Secondary | ICD-10-CM | POA: Diagnosis not present

## 2016-09-05 DIAGNOSIS — Z794 Long term (current) use of insulin: Secondary | ICD-10-CM | POA: Diagnosis not present

## 2016-09-05 DIAGNOSIS — S728X9A Other fracture of unspecified femur, initial encounter for closed fracture: Secondary | ICD-10-CM | POA: Diagnosis not present

## 2016-09-05 DIAGNOSIS — S72401A Unspecified fracture of lower end of right femur, initial encounter for closed fracture: Secondary | ICD-10-CM | POA: Diagnosis not present

## 2016-09-05 DIAGNOSIS — E039 Hypothyroidism, unspecified: Secondary | ICD-10-CM | POA: Diagnosis present

## 2016-09-05 DIAGNOSIS — E11622 Type 2 diabetes mellitus with other skin ulcer: Secondary | ICD-10-CM | POA: Diagnosis present

## 2016-09-05 DIAGNOSIS — E669 Obesity, unspecified: Secondary | ICD-10-CM | POA: Diagnosis not present

## 2016-09-05 DIAGNOSIS — W1789XA Other fall from one level to another, initial encounter: Secondary | ICD-10-CM | POA: Diagnosis not present

## 2016-09-05 DIAGNOSIS — M25562 Pain in left knee: Secondary | ICD-10-CM | POA: Diagnosis not present

## 2016-09-05 DIAGNOSIS — T148XXA Other injury of unspecified body region, initial encounter: Secondary | ICD-10-CM | POA: Diagnosis not present

## 2016-09-05 DIAGNOSIS — D696 Thrombocytopenia, unspecified: Secondary | ICD-10-CM | POA: Diagnosis present

## 2016-09-05 DIAGNOSIS — S72491A Other fracture of lower end of right femur, initial encounter for closed fracture: Secondary | ICD-10-CM | POA: Diagnosis not present

## 2016-09-05 DIAGNOSIS — I9589 Other hypotension: Secondary | ICD-10-CM | POA: Diagnosis present

## 2016-09-05 DIAGNOSIS — Z96652 Presence of left artificial knee joint: Secondary | ICD-10-CM | POA: Diagnosis not present

## 2016-09-05 DIAGNOSIS — Z6836 Body mass index (BMI) 36.0-36.9, adult: Secondary | ICD-10-CM | POA: Diagnosis not present

## 2016-09-05 DIAGNOSIS — D62 Acute posthemorrhagic anemia: Secondary | ICD-10-CM | POA: Diagnosis not present

## 2016-09-05 DIAGNOSIS — E1122 Type 2 diabetes mellitus with diabetic chronic kidney disease: Secondary | ICD-10-CM | POA: Diagnosis present

## 2016-09-05 DIAGNOSIS — S79101A Unspecified physeal fracture of lower end of right femur, initial encounter for closed fracture: Secondary | ICD-10-CM | POA: Diagnosis not present

## 2016-09-05 DIAGNOSIS — I120 Hypertensive chronic kidney disease with stage 5 chronic kidney disease or end stage renal disease: Secondary | ICD-10-CM | POA: Diagnosis present

## 2016-09-05 DIAGNOSIS — L97911 Non-pressure chronic ulcer of unspecified part of right lower leg limited to breakdown of skin: Secondary | ICD-10-CM | POA: Diagnosis not present

## 2016-09-05 DIAGNOSIS — G8911 Acute pain due to trauma: Secondary | ICD-10-CM | POA: Diagnosis not present

## 2016-09-05 DIAGNOSIS — Z6835 Body mass index (BMI) 35.0-35.9, adult: Secondary | ICD-10-CM | POA: Diagnosis not present

## 2016-09-05 DIAGNOSIS — D638 Anemia in other chronic diseases classified elsewhere: Secondary | ICD-10-CM

## 2016-09-05 DIAGNOSIS — N186 End stage renal disease: Secondary | ICD-10-CM | POA: Diagnosis present

## 2016-09-05 DIAGNOSIS — Z96641 Presence of right artificial hip joint: Secondary | ICD-10-CM | POA: Diagnosis not present

## 2016-09-05 DIAGNOSIS — Z89512 Acquired absence of left leg below knee: Secondary | ICD-10-CM | POA: Diagnosis not present

## 2016-09-05 DIAGNOSIS — Z471 Aftercare following joint replacement surgery: Secondary | ICD-10-CM | POA: Diagnosis not present

## 2016-09-05 DIAGNOSIS — Z6837 Body mass index (BMI) 37.0-37.9, adult: Secondary | ICD-10-CM | POA: Diagnosis not present

## 2016-09-05 DIAGNOSIS — Z992 Dependence on renal dialysis: Secondary | ICD-10-CM | POA: Diagnosis not present

## 2016-09-05 DIAGNOSIS — E1159 Type 2 diabetes mellitus with other circulatory complications: Secondary | ICD-10-CM | POA: Diagnosis present

## 2016-09-05 DIAGNOSIS — Z86718 Personal history of other venous thrombosis and embolism: Secondary | ICD-10-CM | POA: Diagnosis not present

## 2016-09-05 DIAGNOSIS — M25569 Pain in unspecified knee: Secondary | ICD-10-CM | POA: Diagnosis not present

## 2016-09-05 DIAGNOSIS — E1129 Type 2 diabetes mellitus with other diabetic kidney complication: Secondary | ICD-10-CM | POA: Diagnosis present

## 2016-09-05 DIAGNOSIS — Y92019 Unspecified place in single-family (private) house as the place of occurrence of the external cause: Secondary | ICD-10-CM | POA: Diagnosis not present

## 2016-09-05 DIAGNOSIS — L89310 Pressure ulcer of right buttock, unstageable: Secondary | ICD-10-CM | POA: Diagnosis present

## 2016-09-05 DIAGNOSIS — Z6841 Body Mass Index (BMI) 40.0 and over, adult: Secondary | ICD-10-CM | POA: Diagnosis not present

## 2016-09-05 DIAGNOSIS — E875 Hyperkalemia: Secondary | ICD-10-CM | POA: Diagnosis present

## 2016-09-05 DIAGNOSIS — I872 Venous insufficiency (chronic) (peripheral): Secondary | ICD-10-CM | POA: Diagnosis present

## 2016-09-05 DIAGNOSIS — Z96642 Presence of left artificial hip joint: Secondary | ICD-10-CM | POA: Diagnosis not present

## 2016-09-05 DIAGNOSIS — E1140 Type 2 diabetes mellitus with diabetic neuropathy, unspecified: Secondary | ICD-10-CM | POA: Diagnosis present

## 2016-09-05 DIAGNOSIS — I953 Hypotension of hemodialysis: Secondary | ICD-10-CM | POA: Diagnosis not present

## 2016-09-05 DIAGNOSIS — I10 Essential (primary) hypertension: Secondary | ICD-10-CM | POA: Diagnosis not present

## 2016-09-05 DIAGNOSIS — S72401D Unspecified fracture of lower end of right femur, subsequent encounter for closed fracture with routine healing: Secondary | ICD-10-CM | POA: Diagnosis not present

## 2016-09-06 ENCOUNTER — Telehealth (INDEPENDENT_AMBULATORY_CARE_PROVIDER_SITE_OTHER): Payer: Self-pay | Admitting: Orthopedic Surgery

## 2016-09-06 NOTE — Telephone Encounter (Signed)
Patient's wife Katharine Look) called to let Dr Sharol Given know that patient is in the hospital at [facility] asked if Dr Sharol Given can call her. Katharine Look advised patient is having surgery tomorrow at 1:30pm. The doctor that is doing the surgery is Dr. Delena Bali.  The number to contact her is [phone]

## 2016-09-07 ENCOUNTER — Telehealth (INDEPENDENT_AMBULATORY_CARE_PROVIDER_SITE_OTHER): Payer: Self-pay | Admitting: Orthopedic Surgery

## 2016-09-07 NOTE — Telephone Encounter (Signed)
I called patient's wife there was no answer.

## 2016-09-07 NOTE — Telephone Encounter (Signed)
I called and left message with patient's wife

## 2016-09-07 NOTE — Telephone Encounter (Signed)
Patient called saying that he is currently at [facility] with a broken femur. Was wondering if Dr. Sharol Given could speak with his doctor Dr. Lily Kocher. CB # [phone]

## 2016-09-07 NOTE — Telephone Encounter (Signed)
See below

## 2016-09-08 NOTE — Telephone Encounter (Signed)
I called and spoke with [name] patients wife. Patient did have orthopedic surgery for femur fracture in [place] where they live. He is doing well so far. Advised that Dr. Sharol Given did call and leave a voicemail. And that if they need anything from our office to please call.

## 2016-09-15 DIAGNOSIS — S72491D Other fracture of lower end of right femur, subsequent encounter for closed fracture with routine healing: Secondary | ICD-10-CM | POA: Diagnosis not present

## 2016-09-15 DIAGNOSIS — E1121 Type 2 diabetes mellitus with diabetic nephropathy: Secondary | ICD-10-CM | POA: Diagnosis not present

## 2016-09-15 DIAGNOSIS — M25551 Pain in right hip: Secondary | ICD-10-CM | POA: Diagnosis not present

## 2016-09-15 DIAGNOSIS — E039 Hypothyroidism, unspecified: Secondary | ICD-10-CM | POA: Diagnosis not present

## 2016-09-15 DIAGNOSIS — E875 Hyperkalemia: Secondary | ICD-10-CM | POA: Diagnosis not present

## 2016-09-15 DIAGNOSIS — M25552 Pain in left hip: Secondary | ICD-10-CM | POA: Diagnosis not present

## 2016-09-15 DIAGNOSIS — L89611 Pressure ulcer of right heel, stage 1: Secondary | ICD-10-CM | POA: Diagnosis not present

## 2016-09-15 DIAGNOSIS — S72002S Fracture of unspecified part of neck of left femur, sequela: Secondary | ICD-10-CM | POA: Diagnosis not present

## 2016-09-15 DIAGNOSIS — L89612 Pressure ulcer of right heel, stage 2: Secondary | ICD-10-CM | POA: Diagnosis not present

## 2016-09-15 DIAGNOSIS — E8779 Other fluid overload: Secondary | ICD-10-CM | POA: Diagnosis not present

## 2016-09-15 DIAGNOSIS — L89613 Pressure ulcer of right heel, stage 3: Secondary | ICD-10-CM | POA: Diagnosis not present

## 2016-09-15 DIAGNOSIS — Z23 Encounter for immunization: Secondary | ICD-10-CM | POA: Diagnosis not present

## 2016-09-15 DIAGNOSIS — D638 Anemia in other chronic diseases classified elsewhere: Secondary | ICD-10-CM | POA: Diagnosis not present

## 2016-09-15 DIAGNOSIS — Z794 Long term (current) use of insulin: Secondary | ICD-10-CM | POA: Diagnosis not present

## 2016-09-15 DIAGNOSIS — D649 Anemia, unspecified: Secondary | ICD-10-CM | POA: Diagnosis not present

## 2016-09-15 DIAGNOSIS — N2581 Secondary hyperparathyroidism of renal origin: Secondary | ICD-10-CM | POA: Diagnosis not present

## 2016-09-15 DIAGNOSIS — Z79899 Other long term (current) drug therapy: Secondary | ICD-10-CM | POA: Diagnosis not present

## 2016-09-15 DIAGNOSIS — M79652 Pain in left thigh: Secondary | ICD-10-CM | POA: Diagnosis not present

## 2016-09-15 DIAGNOSIS — L89890 Pressure ulcer of other site, unstageable: Secondary | ICD-10-CM | POA: Diagnosis not present

## 2016-09-15 DIAGNOSIS — E1122 Type 2 diabetes mellitus with diabetic chronic kidney disease: Secondary | ICD-10-CM | POA: Diagnosis not present

## 2016-09-15 DIAGNOSIS — S72401A Unspecified fracture of lower end of right femur, initial encounter for closed fracture: Secondary | ICD-10-CM | POA: Diagnosis not present

## 2016-09-15 DIAGNOSIS — Z992 Dependence on renal dialysis: Secondary | ICD-10-CM | POA: Diagnosis not present

## 2016-09-15 DIAGNOSIS — I10 Essential (primary) hypertension: Secondary | ICD-10-CM | POA: Diagnosis not present

## 2016-09-15 DIAGNOSIS — F329 Major depressive disorder, single episode, unspecified: Secondary | ICD-10-CM | POA: Diagnosis not present

## 2016-09-15 DIAGNOSIS — L89893 Pressure ulcer of other site, stage 3: Secondary | ICD-10-CM | POA: Diagnosis not present

## 2016-09-15 DIAGNOSIS — M25561 Pain in right knee: Secondary | ICD-10-CM | POA: Diagnosis not present

## 2016-09-15 DIAGNOSIS — D509 Iron deficiency anemia, unspecified: Secondary | ICD-10-CM | POA: Diagnosis not present

## 2016-09-15 DIAGNOSIS — S72401D Unspecified fracture of lower end of right femur, subsequent encounter for closed fracture with routine healing: Secondary | ICD-10-CM | POA: Diagnosis not present

## 2016-09-15 DIAGNOSIS — N186 End stage renal disease: Secondary | ICD-10-CM | POA: Diagnosis not present

## 2016-09-15 DIAGNOSIS — Z6837 Body mass index (BMI) 37.0-37.9, adult: Secondary | ICD-10-CM | POA: Diagnosis not present

## 2016-09-15 DIAGNOSIS — Z471 Aftercare following joint replacement surgery: Secondary | ICD-10-CM | POA: Diagnosis not present

## 2016-09-15 DIAGNOSIS — S81801D Unspecified open wound, right lower leg, subsequent encounter: Secondary | ICD-10-CM | POA: Diagnosis not present

## 2016-09-15 DIAGNOSIS — G8911 Acute pain due to trauma: Secondary | ICD-10-CM | POA: Diagnosis not present

## 2016-09-15 DIAGNOSIS — M25559 Pain in unspecified hip: Secondary | ICD-10-CM | POA: Diagnosis not present

## 2016-09-15 DIAGNOSIS — S81801A Unspecified open wound, right lower leg, initial encounter: Secondary | ICD-10-CM | POA: Diagnosis not present

## 2016-09-15 DIAGNOSIS — L89150 Pressure ulcer of sacral region, unstageable: Secondary | ICD-10-CM | POA: Diagnosis not present

## 2016-09-15 DIAGNOSIS — Z96643 Presence of artificial hip joint, bilateral: Secondary | ICD-10-CM | POA: Diagnosis not present

## 2016-09-15 DIAGNOSIS — F419 Anxiety disorder, unspecified: Secondary | ICD-10-CM | POA: Diagnosis not present

## 2016-09-15 DIAGNOSIS — E1165 Type 2 diabetes mellitus with hyperglycemia: Secondary | ICD-10-CM | POA: Diagnosis not present

## 2016-09-15 DIAGNOSIS — D631 Anemia in chronic kidney disease: Secondary | ICD-10-CM | POA: Diagnosis not present

## 2016-09-15 DIAGNOSIS — S728X9A Other fracture of unspecified femur, initial encounter for closed fracture: Secondary | ICD-10-CM | POA: Diagnosis not present

## 2016-09-18 DIAGNOSIS — N186 End stage renal disease: Secondary | ICD-10-CM | POA: Diagnosis not present

## 2016-09-18 DIAGNOSIS — N2581 Secondary hyperparathyroidism of renal origin: Secondary | ICD-10-CM | POA: Diagnosis not present

## 2016-09-18 DIAGNOSIS — D631 Anemia in chronic kidney disease: Secondary | ICD-10-CM | POA: Diagnosis not present

## 2016-09-19 DIAGNOSIS — D631 Anemia in chronic kidney disease: Secondary | ICD-10-CM | POA: Diagnosis not present

## 2016-09-19 DIAGNOSIS — N186 End stage renal disease: Secondary | ICD-10-CM | POA: Diagnosis not present

## 2016-09-19 DIAGNOSIS — N2581 Secondary hyperparathyroidism of renal origin: Secondary | ICD-10-CM | POA: Diagnosis not present

## 2016-09-20 ENCOUNTER — Ambulatory Visit: Payer: Medicare Other | Admitting: Family Medicine

## 2016-09-21 DIAGNOSIS — N2581 Secondary hyperparathyroidism of renal origin: Secondary | ICD-10-CM | POA: Diagnosis not present

## 2016-09-21 DIAGNOSIS — D631 Anemia in chronic kidney disease: Secondary | ICD-10-CM | POA: Diagnosis not present

## 2016-09-21 DIAGNOSIS — N186 End stage renal disease: Secondary | ICD-10-CM | POA: Diagnosis not present

## 2016-09-23 DIAGNOSIS — D631 Anemia in chronic kidney disease: Secondary | ICD-10-CM | POA: Diagnosis not present

## 2016-09-23 DIAGNOSIS — N186 End stage renal disease: Secondary | ICD-10-CM | POA: Diagnosis not present

## 2016-09-23 DIAGNOSIS — N2581 Secondary hyperparathyroidism of renal origin: Secondary | ICD-10-CM | POA: Diagnosis not present

## 2016-09-26 DIAGNOSIS — L89611 Pressure ulcer of right heel, stage 1: Secondary | ICD-10-CM | POA: Diagnosis not present

## 2016-09-26 DIAGNOSIS — L89150 Pressure ulcer of sacral region, unstageable: Secondary | ICD-10-CM | POA: Diagnosis not present

## 2016-09-26 DIAGNOSIS — N2581 Secondary hyperparathyroidism of renal origin: Secondary | ICD-10-CM | POA: Diagnosis not present

## 2016-09-26 DIAGNOSIS — N186 End stage renal disease: Secondary | ICD-10-CM | POA: Diagnosis not present

## 2016-09-26 DIAGNOSIS — L89890 Pressure ulcer of other site, unstageable: Secondary | ICD-10-CM | POA: Diagnosis not present

## 2016-09-26 DIAGNOSIS — D631 Anemia in chronic kidney disease: Secondary | ICD-10-CM | POA: Diagnosis not present

## 2016-09-26 DIAGNOSIS — S81801A Unspecified open wound, right lower leg, initial encounter: Secondary | ICD-10-CM | POA: Diagnosis not present

## 2016-09-26 DIAGNOSIS — S81801D Unspecified open wound, right lower leg, subsequent encounter: Secondary | ICD-10-CM | POA: Diagnosis not present

## 2016-09-27 DIAGNOSIS — D631 Anemia in chronic kidney disease: Secondary | ICD-10-CM | POA: Diagnosis not present

## 2016-09-27 DIAGNOSIS — N186 End stage renal disease: Secondary | ICD-10-CM | POA: Diagnosis not present

## 2016-09-27 DIAGNOSIS — I10 Essential (primary) hypertension: Secondary | ICD-10-CM | POA: Diagnosis not present

## 2016-09-28 DIAGNOSIS — N186 End stage renal disease: Secondary | ICD-10-CM | POA: Diagnosis not present

## 2016-09-28 DIAGNOSIS — D631 Anemia in chronic kidney disease: Secondary | ICD-10-CM | POA: Diagnosis not present

## 2016-09-30 DIAGNOSIS — N186 End stage renal disease: Secondary | ICD-10-CM | POA: Diagnosis not present

## 2016-09-30 DIAGNOSIS — D631 Anemia in chronic kidney disease: Secondary | ICD-10-CM | POA: Diagnosis not present

## 2016-10-03 DIAGNOSIS — D631 Anemia in chronic kidney disease: Secondary | ICD-10-CM | POA: Diagnosis not present

## 2016-10-03 DIAGNOSIS — N186 End stage renal disease: Secondary | ICD-10-CM | POA: Diagnosis not present

## 2016-10-05 DIAGNOSIS — N186 End stage renal disease: Secondary | ICD-10-CM | POA: Diagnosis not present

## 2016-10-05 DIAGNOSIS — D631 Anemia in chronic kidney disease: Secondary | ICD-10-CM | POA: Diagnosis not present

## 2016-10-07 DIAGNOSIS — D631 Anemia in chronic kidney disease: Secondary | ICD-10-CM | POA: Diagnosis not present

## 2016-10-07 DIAGNOSIS — N186 End stage renal disease: Secondary | ICD-10-CM | POA: Diagnosis not present

## 2016-10-10 DIAGNOSIS — S81801A Unspecified open wound, right lower leg, initial encounter: Secondary | ICD-10-CM | POA: Diagnosis not present

## 2016-10-10 DIAGNOSIS — S81801D Unspecified open wound, right lower leg, subsequent encounter: Secondary | ICD-10-CM | POA: Diagnosis not present

## 2016-10-10 DIAGNOSIS — L89890 Pressure ulcer of other site, unstageable: Secondary | ICD-10-CM | POA: Diagnosis not present

## 2016-10-10 DIAGNOSIS — D631 Anemia in chronic kidney disease: Secondary | ICD-10-CM | POA: Diagnosis not present

## 2016-10-10 DIAGNOSIS — N186 End stage renal disease: Secondary | ICD-10-CM | POA: Diagnosis not present

## 2016-10-10 DIAGNOSIS — L89611 Pressure ulcer of right heel, stage 1: Secondary | ICD-10-CM | POA: Diagnosis not present

## 2016-10-10 DIAGNOSIS — L89150 Pressure ulcer of sacral region, unstageable: Secondary | ICD-10-CM | POA: Diagnosis not present

## 2016-10-12 DIAGNOSIS — D631 Anemia in chronic kidney disease: Secondary | ICD-10-CM | POA: Diagnosis not present

## 2016-10-12 DIAGNOSIS — N186 End stage renal disease: Secondary | ICD-10-CM | POA: Diagnosis not present

## 2016-10-14 DIAGNOSIS — N186 End stage renal disease: Secondary | ICD-10-CM | POA: Diagnosis not present

## 2016-10-14 DIAGNOSIS — D631 Anemia in chronic kidney disease: Secondary | ICD-10-CM | POA: Diagnosis not present

## 2016-10-17 DIAGNOSIS — L89890 Pressure ulcer of other site, unstageable: Secondary | ICD-10-CM | POA: Diagnosis not present

## 2016-10-17 DIAGNOSIS — S81801D Unspecified open wound, right lower leg, subsequent encounter: Secondary | ICD-10-CM | POA: Diagnosis not present

## 2016-10-17 DIAGNOSIS — S81801A Unspecified open wound, right lower leg, initial encounter: Secondary | ICD-10-CM | POA: Diagnosis not present

## 2016-10-17 DIAGNOSIS — L89150 Pressure ulcer of sacral region, unstageable: Secondary | ICD-10-CM | POA: Diagnosis not present

## 2016-10-17 DIAGNOSIS — N186 End stage renal disease: Secondary | ICD-10-CM | POA: Diagnosis not present

## 2016-10-17 DIAGNOSIS — D631 Anemia in chronic kidney disease: Secondary | ICD-10-CM | POA: Diagnosis not present

## 2016-10-17 DIAGNOSIS — L89611 Pressure ulcer of right heel, stage 1: Secondary | ICD-10-CM | POA: Diagnosis not present

## 2016-10-19 DIAGNOSIS — D631 Anemia in chronic kidney disease: Secondary | ICD-10-CM | POA: Diagnosis not present

## 2016-10-19 DIAGNOSIS — N186 End stage renal disease: Secondary | ICD-10-CM | POA: Diagnosis not present

## 2016-10-21 DIAGNOSIS — N186 End stage renal disease: Secondary | ICD-10-CM | POA: Diagnosis not present

## 2016-10-21 DIAGNOSIS — D631 Anemia in chronic kidney disease: Secondary | ICD-10-CM | POA: Diagnosis not present

## 2016-10-23 DIAGNOSIS — M25561 Pain in right knee: Secondary | ICD-10-CM | POA: Diagnosis not present

## 2016-10-23 DIAGNOSIS — S72401A Unspecified fracture of lower end of right femur, initial encounter for closed fracture: Secondary | ICD-10-CM | POA: Diagnosis not present

## 2016-10-23 DIAGNOSIS — M25552 Pain in left hip: Secondary | ICD-10-CM | POA: Diagnosis not present

## 2016-10-23 DIAGNOSIS — S72491D Other fracture of lower end of right femur, subsequent encounter for closed fracture with routine healing: Secondary | ICD-10-CM | POA: Diagnosis not present

## 2016-10-23 DIAGNOSIS — M25551 Pain in right hip: Secondary | ICD-10-CM | POA: Diagnosis not present

## 2016-10-23 DIAGNOSIS — Z6837 Body mass index (BMI) 37.0-37.9, adult: Secondary | ICD-10-CM | POA: Diagnosis not present

## 2016-10-24 DIAGNOSIS — N186 End stage renal disease: Secondary | ICD-10-CM | POA: Diagnosis not present

## 2016-10-24 DIAGNOSIS — L89150 Pressure ulcer of sacral region, unstageable: Secondary | ICD-10-CM | POA: Diagnosis not present

## 2016-10-24 DIAGNOSIS — S81801D Unspecified open wound, right lower leg, subsequent encounter: Secondary | ICD-10-CM | POA: Diagnosis not present

## 2016-10-24 DIAGNOSIS — S81801A Unspecified open wound, right lower leg, initial encounter: Secondary | ICD-10-CM | POA: Diagnosis not present

## 2016-10-24 DIAGNOSIS — D631 Anemia in chronic kidney disease: Secondary | ICD-10-CM | POA: Diagnosis not present

## 2016-10-24 DIAGNOSIS — L89890 Pressure ulcer of other site, unstageable: Secondary | ICD-10-CM | POA: Diagnosis not present

## 2016-10-24 DIAGNOSIS — L89611 Pressure ulcer of right heel, stage 1: Secondary | ICD-10-CM | POA: Diagnosis not present

## 2016-10-26 DIAGNOSIS — N186 End stage renal disease: Secondary | ICD-10-CM | POA: Diagnosis not present

## 2016-10-26 DIAGNOSIS — D631 Anemia in chronic kidney disease: Secondary | ICD-10-CM | POA: Diagnosis not present

## 2016-10-27 DIAGNOSIS — Z96643 Presence of artificial hip joint, bilateral: Secondary | ICD-10-CM | POA: Diagnosis not present

## 2016-10-27 DIAGNOSIS — Z471 Aftercare following joint replacement surgery: Secondary | ICD-10-CM | POA: Diagnosis not present

## 2016-10-27 DIAGNOSIS — M25559 Pain in unspecified hip: Secondary | ICD-10-CM | POA: Diagnosis not present

## 2016-10-27 DIAGNOSIS — M25561 Pain in right knee: Secondary | ICD-10-CM | POA: Diagnosis not present

## 2016-10-28 DIAGNOSIS — D509 Iron deficiency anemia, unspecified: Secondary | ICD-10-CM | POA: Diagnosis not present

## 2016-10-28 DIAGNOSIS — I10 Essential (primary) hypertension: Secondary | ICD-10-CM | POA: Diagnosis not present

## 2016-10-28 DIAGNOSIS — D631 Anemia in chronic kidney disease: Secondary | ICD-10-CM | POA: Diagnosis not present

## 2016-10-28 DIAGNOSIS — N186 End stage renal disease: Secondary | ICD-10-CM | POA: Diagnosis not present

## 2016-10-31 ENCOUNTER — Telehealth (INDEPENDENT_AMBULATORY_CARE_PROVIDER_SITE_OTHER): Payer: Self-pay | Admitting: Family

## 2016-10-31 DIAGNOSIS — L89890 Pressure ulcer of other site, unstageable: Secondary | ICD-10-CM | POA: Diagnosis not present

## 2016-10-31 DIAGNOSIS — L89611 Pressure ulcer of right heel, stage 1: Secondary | ICD-10-CM | POA: Diagnosis not present

## 2016-10-31 DIAGNOSIS — S81801A Unspecified open wound, right lower leg, initial encounter: Secondary | ICD-10-CM | POA: Diagnosis not present

## 2016-10-31 DIAGNOSIS — D631 Anemia in chronic kidney disease: Secondary | ICD-10-CM | POA: Diagnosis not present

## 2016-10-31 DIAGNOSIS — D509 Iron deficiency anemia, unspecified: Secondary | ICD-10-CM | POA: Diagnosis not present

## 2016-10-31 DIAGNOSIS — L89150 Pressure ulcer of sacral region, unstageable: Secondary | ICD-10-CM | POA: Diagnosis not present

## 2016-10-31 DIAGNOSIS — N186 End stage renal disease: Secondary | ICD-10-CM | POA: Diagnosis not present

## 2016-10-31 DIAGNOSIS — S81801D Unspecified open wound, right lower leg, subsequent encounter: Secondary | ICD-10-CM | POA: Diagnosis not present

## 2016-10-31 NOTE — Telephone Encounter (Signed)
Patient called left voicemail message stating he can not keep his appointment tomorrow because the Lucianne Lei has broke down at the facility. Patient said the facility will call and reschedule his appointment. Patient  [name] he can not drive his wife's car because of his leg. Patient said he do not know what else to do. The number to contact patient is [phone]

## 2016-11-01 ENCOUNTER — Ambulatory Visit (INDEPENDENT_AMBULATORY_CARE_PROVIDER_SITE_OTHER): Payer: BC Managed Care – PPO | Admitting: Family

## 2016-11-01 NOTE — Telephone Encounter (Signed)
I called and left voicemail advising the only other thing he could do is EMS nonemergent transport to our office.

## 2016-11-02 DIAGNOSIS — D509 Iron deficiency anemia, unspecified: Secondary | ICD-10-CM | POA: Diagnosis not present

## 2016-11-02 DIAGNOSIS — N186 End stage renal disease: Secondary | ICD-10-CM | POA: Diagnosis not present

## 2016-11-02 DIAGNOSIS — D631 Anemia in chronic kidney disease: Secondary | ICD-10-CM | POA: Diagnosis not present

## 2016-11-04 DIAGNOSIS — D509 Iron deficiency anemia, unspecified: Secondary | ICD-10-CM | POA: Diagnosis not present

## 2016-11-04 DIAGNOSIS — D631 Anemia in chronic kidney disease: Secondary | ICD-10-CM | POA: Diagnosis not present

## 2016-11-04 DIAGNOSIS — N186 End stage renal disease: Secondary | ICD-10-CM | POA: Diagnosis not present

## 2016-11-07 ENCOUNTER — Telehealth (INDEPENDENT_AMBULATORY_CARE_PROVIDER_SITE_OTHER): Payer: Self-pay | Admitting: Orthopedic Surgery

## 2016-11-07 DIAGNOSIS — D509 Iron deficiency anemia, unspecified: Secondary | ICD-10-CM | POA: Diagnosis not present

## 2016-11-07 DIAGNOSIS — L89612 Pressure ulcer of right heel, stage 2: Secondary | ICD-10-CM | POA: Diagnosis not present

## 2016-11-07 DIAGNOSIS — D631 Anemia in chronic kidney disease: Secondary | ICD-10-CM | POA: Diagnosis not present

## 2016-11-07 DIAGNOSIS — L89890 Pressure ulcer of other site, unstageable: Secondary | ICD-10-CM | POA: Diagnosis not present

## 2016-11-07 DIAGNOSIS — S81801D Unspecified open wound, right lower leg, subsequent encounter: Secondary | ICD-10-CM | POA: Diagnosis not present

## 2016-11-07 DIAGNOSIS — N186 End stage renal disease: Secondary | ICD-10-CM | POA: Diagnosis not present

## 2016-11-07 NOTE — Telephone Encounter (Signed)
[name] with [name] called advised patient want to be under Dr Jess Barters care to follow up from his surgery done by doctor Flower. Dr Brion Aliment wanted to know if Dr Sharol Given will except [name] before his office will release him   The number to contact [name] is [phone].  Dr Casimer Leek office is located in [address] in [place]. The number to contact Dr. Casimer Leek office is [phone]

## 2016-11-08 NOTE — Telephone Encounter (Signed)
Call patient and let him know that it's okay for [place] to follow-up for his femur fracture

## 2016-11-09 DIAGNOSIS — D509 Iron deficiency anemia, unspecified: Secondary | ICD-10-CM | POA: Diagnosis not present

## 2016-11-09 DIAGNOSIS — N186 End stage renal disease: Secondary | ICD-10-CM | POA: Diagnosis not present

## 2016-11-09 DIAGNOSIS — D631 Anemia in chronic kidney disease: Secondary | ICD-10-CM | POA: Diagnosis not present

## 2016-11-09 NOTE — Telephone Encounter (Signed)
I have called [name] and arranged patients first appointment since he is at a facility. Also called Dr. Casimer Leek and left message for his assistant Sonia Baller to let her know Dr. Sharol Given would resume patients orthopedic care.

## 2016-11-11 DIAGNOSIS — D509 Iron deficiency anemia, unspecified: Secondary | ICD-10-CM | POA: Diagnosis not present

## 2016-11-11 DIAGNOSIS — D631 Anemia in chronic kidney disease: Secondary | ICD-10-CM | POA: Diagnosis not present

## 2016-11-11 DIAGNOSIS — N186 End stage renal disease: Secondary | ICD-10-CM | POA: Diagnosis not present

## 2016-11-14 DIAGNOSIS — D631 Anemia in chronic kidney disease: Secondary | ICD-10-CM | POA: Diagnosis not present

## 2016-11-14 DIAGNOSIS — D509 Iron deficiency anemia, unspecified: Secondary | ICD-10-CM | POA: Diagnosis not present

## 2016-11-14 DIAGNOSIS — N186 End stage renal disease: Secondary | ICD-10-CM | POA: Diagnosis not present

## 2016-11-14 DIAGNOSIS — S81801D Unspecified open wound, right lower leg, subsequent encounter: Secondary | ICD-10-CM | POA: Diagnosis not present

## 2016-11-14 DIAGNOSIS — L89893 Pressure ulcer of other site, stage 3: Secondary | ICD-10-CM | POA: Diagnosis not present

## 2016-11-14 DIAGNOSIS — L89612 Pressure ulcer of right heel, stage 2: Secondary | ICD-10-CM | POA: Diagnosis not present

## 2016-11-16 DIAGNOSIS — D509 Iron deficiency anemia, unspecified: Secondary | ICD-10-CM | POA: Diagnosis not present

## 2016-11-16 DIAGNOSIS — D631 Anemia in chronic kidney disease: Secondary | ICD-10-CM | POA: Diagnosis not present

## 2016-11-16 DIAGNOSIS — N186 End stage renal disease: Secondary | ICD-10-CM | POA: Diagnosis not present

## 2016-11-17 DIAGNOSIS — Z992 Dependence on renal dialysis: Secondary | ICD-10-CM | POA: Diagnosis not present

## 2016-11-17 DIAGNOSIS — N186 End stage renal disease: Secondary | ICD-10-CM | POA: Diagnosis not present

## 2016-11-18 DIAGNOSIS — D631 Anemia in chronic kidney disease: Secondary | ICD-10-CM | POA: Diagnosis not present

## 2016-11-18 DIAGNOSIS — N186 End stage renal disease: Secondary | ICD-10-CM | POA: Diagnosis not present

## 2016-11-18 DIAGNOSIS — D509 Iron deficiency anemia, unspecified: Secondary | ICD-10-CM | POA: Diagnosis not present

## 2016-11-20 ENCOUNTER — Ambulatory Visit (INDEPENDENT_AMBULATORY_CARE_PROVIDER_SITE_OTHER): Payer: BC Managed Care – PPO

## 2016-11-20 ENCOUNTER — Ambulatory Visit (INDEPENDENT_AMBULATORY_CARE_PROVIDER_SITE_OTHER): Payer: BC Managed Care – PPO | Admitting: Orthopedic Surgery

## 2016-11-20 ENCOUNTER — Encounter (INDEPENDENT_AMBULATORY_CARE_PROVIDER_SITE_OTHER): Payer: Self-pay | Admitting: Orthopedic Surgery

## 2016-11-20 DIAGNOSIS — S72401D Unspecified fracture of lower end of right femur, subsequent encounter for closed fracture with routine healing: Secondary | ICD-10-CM | POA: Diagnosis not present

## 2016-11-20 DIAGNOSIS — S72002S Fracture of unspecified part of neck of left femur, sequela: Secondary | ICD-10-CM

## 2016-11-20 DIAGNOSIS — M25552 Pain in left hip: Secondary | ICD-10-CM

## 2016-11-20 DIAGNOSIS — M25551 Pain in right hip: Secondary | ICD-10-CM

## 2016-11-20 NOTE — Progress Notes (Signed)
Office Visit Note   Patient: [name]           Date of Birth: 03/18/1955           MRN: [id number] Visit Date: 11/20/2016              Requested by: No referring provider defined for this encounter. PCP: Patient, No Pcp Per  Chief Complaint  Patient presents with  . Right Hip - Pain    orif right distal femur 09/08/16      HPI: Patient is a 61 year old gentleman who is 2 months status post a right supracondylar femur fracture and a left periprosthetic femur fracture. The supracondylar femur fracture was treated at [facility] the peri prosthetic femur fracture the left was not surgically treated. Patient is currently a complete assistance with a [employer] fast streamer.  Assessment & Plan: Visit Diagnoses:  1. Closed fracture of distal end of right femur with routine healing, unspecified fracture morphology, subsequent encounter   2. Pain in right hip   3. Pain of left hip joint   4. Closed fracture of left hip, sequela     Plan: The left periprosthetic femur fracture appears to have healed in its current position patient is still nonweightbearing on the right lower extremity due to no interval callus formation will need to continue nonweightbearing With continued strict nonweightbearing for the next 4 weeks.  Repeat 2 view radiographs of the left hip and right femur at follow up  Follow-Up Instructions: Return in about 3 weeks (around Feb 13, 202018).   Ortho Exam  Patient is alert, oriented, no adenopathy, well-dressed, normal affect, normal respiratory effort. Examination patient's left lower extremity is externally rotated with a knee flexion contracture patient has not been using his prosthesis on the left leg. Examination the right leg the surgical incision is well-healed. The radiographs shows no interval healing however there is interval callus formation on the left periprosthetic femur fracture with varus alignment. Patient has significant calcification of the femoral vessels  bilaterally.  Imaging: Xr Femur, Min 2 Views Right  Result Date: 11/20/2016 2 view radiographs of the right femur shows stable internal fixation for the supracondylar fracture right knee there is no evidence of callus formation no evidence of any interval healing over the past several months.  Xr Hip Unilat W Or W/o Pelvis 2-3 Views Left  Result Date: 11/20/2016 2 view radiographs of the left hip shows callus formation of the periprosthetic femur fracture this fracture was present on his initial injury films 2 months ago.  No images are attached to the encounter.  Labs: Lab Results  Component Value Date   HGBA1C 5.3 03/20/2016   HGBA1C 5.3 04/29/2015   HGBA1C 6.5 (A) 12/24/2013   ESRSEDRATE 57 (H) 06/13/2010   ESRSEDRATE 40 (H) Feb 13, 202011   ESRSEDRATE 8 11/26/2008   CRP 4.8 (H) Feb 13, 202011   CRP 0.6 (H) 11/26/2008   LABURIC 8.3 (H) 11/25/2015   LABURIC 6.2 10/10/2008   LABURIC 5.6 03/10/2008   REPTSTATUS 12/01/2015 FINAL 11/26/2015   GRAMSTAIN  08/16/2012    NO WBC SEEN MODERATE SQUAMOUS EPITHELIAL CELLS PRESENT MODERATE YEAST   CULT NO GROWTH 5 DAYS 11/26/2015   LABORGA ESCHERICHIA COLI 08/03/2014    Orders:  Orders Placed This Encounter  Procedures  . XR FEMUR, MIN 2 VIEWS RIGHT  . XR HIP UNILAT W OR W/O PELVIS 2-3 VIEWS LEFT   No orders of the defined types were placed in this encounter.    Procedures: No procedures  performed  Clinical Data: No additional findings.  ROS:  All other systems negative, except as noted in the HPI. Review of Systems  Objective: Vital Signs: There were no vitals taken for this visit.  Specialty Comments:  No specialty comments available.  PMFS History: Patient Active Problem List   Diagnosis Date Noted  . Non-pressure chronic ulcer of left calf, limited to breakdown of skin ([place]) 02/07/2016  . Hypotension, unspecified 11/24/2015  . Left knee pain 11/24/2015  . Hypotension 11/24/2015  . Decubitus ulcer of buttock  01/22/2015  . S/P BKA (below knee amputation) unilateral ([place]) 01/22/2015  . Chronic venous insufficiency 09/08/2014  . Diabetes mellitus, type 2 ([place]) 09/08/2014  . ESRD on dialysis ([place]) 09/08/2014  . Hyperlipidemia 12/23/2013  . Diabetic foot ([place]) 11/20/2013  . Chronic pain 05/21/2013  . Chronic interstitial cystitis 02/06/2013  . UI (urinary incontinence) 10/29/2012  . CKD (chronic kidney disease) stage 3, GFR 30-59 ml/min 08/18/2012  . Hypothyroidism, acquired 08/04/2012  . OBESITY 09/21/2009  . Essential hypertension 09/21/2009  . DIVERTICULOSIS OF COLON 09/21/2009  . Type 2 diabetes mellitus with diabetic nephropathy, with long-term current use of insulin ([place]) 10/01/2007  . DVT 10/01/2007   Past Medical History:  Diagnosis Date  . Anemia   . Anxiety   . AR (allergic rhinitis)   . Arthritis    osteoarthritis  . Blood transfusion   . BPH (benign prostatic hypertrophy)   . Cholelithiasis   . Chronic kidney disease    hx of kidney stones  . Chronic pain   . Diabetes mellitus    insulin dependent  . Displacement of lumbar intervertebral disc without myelopathy    L4-L5  . Diverticulosis of colon   . DVT (deep venous thrombosis) (HCC)    Lower Extremity  . Embolism and thrombosis of unspecified artery ([place])   . Gallstone   . History of nephrolithiasis    Bilateral  . Hyperlipidemia   . Hypertension   . Hypertrophy of prostate   . Hypothyroidism   . Impotence of organic origin   . Morbid obesity ([place])   . Nephrolithiasis   . Other lymphedema   . Pancreatitis, acute   . Peripheral vascular disease ([place])   . Personal history of arthritis    Osteoarthritis  . Sleep apnea    uses cpap  . Unspecified septicemia(038.9) ([place])     Family History  Problem Relation Age of Onset  . Diabetes Mother   . Heart attack Mother   . Stroke Mother   . Colon cancer Father   . Dementia Father   . Heart attack Father   . Arthritis Paternal Grandmother   . Hypertension  Other   . Hypothyroidism Other     Past Surgical History:  Procedure Laterality Date  . AMPUTATION  02/07/2011   Procedure: AMPUTATION BELOW KNEE;  Surgeon: Newt Minion, MD;  Location: [facility];  Service: Orthopedics;  Laterality: Left;  Left Below Knee Amputation  . bilteral hip     replacement & revison  's   . CYSTOSCOPY W/ RETROGRADES Bilateral 10/24/2012   Procedure: CYSTOSCOPY WITH RETROGRADE PYELOGRAM Procedure: Cystoscopy, Bilateral Retrogarde Pyelograms, Bladder Biopsy, Hydrodistension;  Surgeon: Franchot Gallo, MD;  Location: [facility];  Service: Urology;  Laterality: Bilateral;  . KIDNEY SURGERY    . KNEE SURGERY     left knee    . LEG AMPUTATION BELOW KNEE    . REPLACEMENT TOTAL KNEE    . TONSILLECTOMY  Social History   Occupational History  . Not on file.   Social History Main Topics  . Smoking status: Never Smoker  . Smokeless tobacco: Never Used  . Alcohol use No  . Drug use: No  . Sexual activity: Yes

## 2016-11-21 ENCOUNTER — Telehealth (INDEPENDENT_AMBULATORY_CARE_PROVIDER_SITE_OTHER): Payer: Self-pay

## 2016-11-21 DIAGNOSIS — D631 Anemia in chronic kidney disease: Secondary | ICD-10-CM | POA: Diagnosis not present

## 2016-11-21 DIAGNOSIS — D509 Iron deficiency anemia, unspecified: Secondary | ICD-10-CM | POA: Diagnosis not present

## 2016-11-21 DIAGNOSIS — N186 End stage renal disease: Secondary | ICD-10-CM | POA: Diagnosis not present

## 2016-11-21 NOTE — Telephone Encounter (Signed)
[name], physical therapist needs clarification on orders for patient.  Cb3 is [phone].  Please advise.  Thank You.

## 2016-11-22 ENCOUNTER — Telehealth (INDEPENDENT_AMBULATORY_CARE_PROVIDER_SITE_OTHER): Payer: Self-pay | Admitting: Radiology

## 2016-11-22 NOTE — Telephone Encounter (Signed)
The fax number you need to send info on Zaivion Kundrat to is [phone], attn Karl Ito.

## 2016-11-22 NOTE — Telephone Encounter (Signed)
Okay for range of motion both lower extremities no weightbearing.

## 2016-11-22 NOTE — Telephone Encounter (Signed)
I called back and spoke with Manuela Schwartz again advised ok for ROM as long as patient is nonweightbearing. She will call us back to give [place] a fax number so we can fax an order since she is currently driving now.

## 2016-11-22 NOTE — Telephone Encounter (Signed)
I called and spoke with Manuela Schwartz, advised nonweightbearing bilateral lower extremities. Wanting to know if patient can at least do any type of ROM. I told her no for now. If you want them do ROM in physical therapy let me know I will call her back.

## 2016-11-22 NOTE — Telephone Encounter (Signed)
Faxed to [phone] attn susan fay

## 2016-11-23 DIAGNOSIS — D509 Iron deficiency anemia, unspecified: Secondary | ICD-10-CM | POA: Diagnosis not present

## 2016-11-23 DIAGNOSIS — D631 Anemia in chronic kidney disease: Secondary | ICD-10-CM | POA: Diagnosis not present

## 2016-11-23 DIAGNOSIS — N186 End stage renal disease: Secondary | ICD-10-CM | POA: Diagnosis not present

## 2016-11-23 NOTE — Telephone Encounter (Signed)
Correct fax number [phone] please refax

## 2016-11-23 NOTE — Telephone Encounter (Signed)
Faxed to correct number listed below.

## 2016-11-25 DIAGNOSIS — N186 End stage renal disease: Secondary | ICD-10-CM | POA: Diagnosis not present

## 2016-11-25 DIAGNOSIS — D509 Iron deficiency anemia, unspecified: Secondary | ICD-10-CM | POA: Diagnosis not present

## 2016-11-25 DIAGNOSIS — D631 Anemia in chronic kidney disease: Secondary | ICD-10-CM | POA: Diagnosis not present

## 2016-11-27 DIAGNOSIS — I10 Essential (primary) hypertension: Secondary | ICD-10-CM | POA: Diagnosis not present

## 2016-11-27 DIAGNOSIS — N2581 Secondary hyperparathyroidism of renal origin: Secondary | ICD-10-CM | POA: Diagnosis not present

## 2016-11-27 DIAGNOSIS — N186 End stage renal disease: Secondary | ICD-10-CM | POA: Diagnosis not present

## 2016-11-27 DIAGNOSIS — E8779 Other fluid overload: Secondary | ICD-10-CM | POA: Diagnosis not present

## 2016-11-27 DIAGNOSIS — Z23 Encounter for immunization: Secondary | ICD-10-CM | POA: Diagnosis not present

## 2016-11-27 DIAGNOSIS — D631 Anemia in chronic kidney disease: Secondary | ICD-10-CM | POA: Diagnosis not present

## 2016-11-28 DIAGNOSIS — L89893 Pressure ulcer of other site, stage 3: Secondary | ICD-10-CM | POA: Diagnosis not present

## 2016-11-28 DIAGNOSIS — L89612 Pressure ulcer of right heel, stage 2: Secondary | ICD-10-CM | POA: Diagnosis not present

## 2016-11-28 DIAGNOSIS — N2581 Secondary hyperparathyroidism of renal origin: Secondary | ICD-10-CM | POA: Diagnosis not present

## 2016-11-28 DIAGNOSIS — S81801D Unspecified open wound, right lower leg, subsequent encounter: Secondary | ICD-10-CM | POA: Diagnosis not present

## 2016-11-28 DIAGNOSIS — Z23 Encounter for immunization: Secondary | ICD-10-CM | POA: Diagnosis not present

## 2016-11-28 DIAGNOSIS — D631 Anemia in chronic kidney disease: Secondary | ICD-10-CM | POA: Diagnosis not present

## 2016-11-28 DIAGNOSIS — N186 End stage renal disease: Secondary | ICD-10-CM | POA: Diagnosis not present

## 2016-11-28 DIAGNOSIS — E8779 Other fluid overload: Secondary | ICD-10-CM | POA: Diagnosis not present

## 2016-11-30 DIAGNOSIS — D631 Anemia in chronic kidney disease: Secondary | ICD-10-CM | POA: Diagnosis not present

## 2016-11-30 DIAGNOSIS — E8779 Other fluid overload: Secondary | ICD-10-CM | POA: Diagnosis not present

## 2016-11-30 DIAGNOSIS — N186 End stage renal disease: Secondary | ICD-10-CM | POA: Diagnosis not present

## 2016-11-30 DIAGNOSIS — N2581 Secondary hyperparathyroidism of renal origin: Secondary | ICD-10-CM | POA: Diagnosis not present

## 2016-11-30 DIAGNOSIS — Z23 Encounter for immunization: Secondary | ICD-10-CM | POA: Diagnosis not present

## 2016-12-02 DIAGNOSIS — D631 Anemia in chronic kidney disease: Secondary | ICD-10-CM | POA: Diagnosis not present

## 2016-12-02 DIAGNOSIS — E8779 Other fluid overload: Secondary | ICD-10-CM | POA: Diagnosis not present

## 2016-12-02 DIAGNOSIS — N2581 Secondary hyperparathyroidism of renal origin: Secondary | ICD-10-CM | POA: Diagnosis not present

## 2016-12-02 DIAGNOSIS — Z23 Encounter for immunization: Secondary | ICD-10-CM | POA: Diagnosis not present

## 2016-12-02 DIAGNOSIS — N186 End stage renal disease: Secondary | ICD-10-CM | POA: Diagnosis not present

## 2016-12-05 DIAGNOSIS — E8779 Other fluid overload: Secondary | ICD-10-CM | POA: Diagnosis not present

## 2016-12-05 DIAGNOSIS — N186 End stage renal disease: Secondary | ICD-10-CM | POA: Diagnosis not present

## 2016-12-05 DIAGNOSIS — Z23 Encounter for immunization: Secondary | ICD-10-CM | POA: Diagnosis not present

## 2016-12-05 DIAGNOSIS — D631 Anemia in chronic kidney disease: Secondary | ICD-10-CM | POA: Diagnosis not present

## 2016-12-05 DIAGNOSIS — L89893 Pressure ulcer of other site, stage 3: Secondary | ICD-10-CM | POA: Diagnosis not present

## 2016-12-05 DIAGNOSIS — N2581 Secondary hyperparathyroidism of renal origin: Secondary | ICD-10-CM | POA: Diagnosis not present

## 2016-12-05 DIAGNOSIS — L89613 Pressure ulcer of right heel, stage 3: Secondary | ICD-10-CM | POA: Diagnosis not present

## 2016-12-07 DIAGNOSIS — N186 End stage renal disease: Secondary | ICD-10-CM | POA: Diagnosis not present

## 2016-12-07 DIAGNOSIS — Z23 Encounter for immunization: Secondary | ICD-10-CM | POA: Diagnosis not present

## 2016-12-07 DIAGNOSIS — E8779 Other fluid overload: Secondary | ICD-10-CM | POA: Diagnosis not present

## 2016-12-07 DIAGNOSIS — D631 Anemia in chronic kidney disease: Secondary | ICD-10-CM | POA: Diagnosis not present

## 2016-12-07 DIAGNOSIS — N2581 Secondary hyperparathyroidism of renal origin: Secondary | ICD-10-CM | POA: Diagnosis not present

## 2016-12-09 DIAGNOSIS — N2581 Secondary hyperparathyroidism of renal origin: Secondary | ICD-10-CM | POA: Diagnosis not present

## 2016-12-09 DIAGNOSIS — E8779 Other fluid overload: Secondary | ICD-10-CM | POA: Diagnosis not present

## 2016-12-09 DIAGNOSIS — N186 End stage renal disease: Secondary | ICD-10-CM | POA: Diagnosis not present

## 2016-12-09 DIAGNOSIS — D631 Anemia in chronic kidney disease: Secondary | ICD-10-CM | POA: Diagnosis not present

## 2016-12-09 DIAGNOSIS — Z23 Encounter for immunization: Secondary | ICD-10-CM | POA: Diagnosis not present

## 2016-12-11 ENCOUNTER — Ambulatory Visit (INDEPENDENT_AMBULATORY_CARE_PROVIDER_SITE_OTHER): Payer: BC Managed Care – PPO | Admitting: Orthopedic Surgery

## 2016-12-12 DIAGNOSIS — L89613 Pressure ulcer of right heel, stage 3: Secondary | ICD-10-CM | POA: Diagnosis not present

## 2016-12-12 DIAGNOSIS — E8779 Other fluid overload: Secondary | ICD-10-CM | POA: Diagnosis not present

## 2016-12-12 DIAGNOSIS — D631 Anemia in chronic kidney disease: Secondary | ICD-10-CM | POA: Diagnosis not present

## 2016-12-12 DIAGNOSIS — N2581 Secondary hyperparathyroidism of renal origin: Secondary | ICD-10-CM | POA: Diagnosis not present

## 2016-12-12 DIAGNOSIS — Z23 Encounter for immunization: Secondary | ICD-10-CM | POA: Diagnosis not present

## 2016-12-12 DIAGNOSIS — N186 End stage renal disease: Secondary | ICD-10-CM | POA: Diagnosis not present

## 2016-12-12 DIAGNOSIS — L89893 Pressure ulcer of other site, stage 3: Secondary | ICD-10-CM | POA: Diagnosis not present

## 2016-12-14 DIAGNOSIS — D631 Anemia in chronic kidney disease: Secondary | ICD-10-CM | POA: Diagnosis not present

## 2016-12-14 DIAGNOSIS — E8779 Other fluid overload: Secondary | ICD-10-CM | POA: Diagnosis not present

## 2016-12-14 DIAGNOSIS — N2581 Secondary hyperparathyroidism of renal origin: Secondary | ICD-10-CM | POA: Diagnosis not present

## 2016-12-14 DIAGNOSIS — N186 End stage renal disease: Secondary | ICD-10-CM | POA: Diagnosis not present

## 2016-12-14 DIAGNOSIS — Z23 Encounter for immunization: Secondary | ICD-10-CM | POA: Diagnosis not present

## 2016-12-16 DIAGNOSIS — D631 Anemia in chronic kidney disease: Secondary | ICD-10-CM | POA: Diagnosis not present

## 2016-12-16 DIAGNOSIS — N2581 Secondary hyperparathyroidism of renal origin: Secondary | ICD-10-CM | POA: Diagnosis not present

## 2016-12-16 DIAGNOSIS — Z23 Encounter for immunization: Secondary | ICD-10-CM | POA: Diagnosis not present

## 2016-12-16 DIAGNOSIS — E8779 Other fluid overload: Secondary | ICD-10-CM | POA: Diagnosis not present

## 2016-12-16 DIAGNOSIS — N186 End stage renal disease: Secondary | ICD-10-CM | POA: Diagnosis not present

## 2016-12-19 DIAGNOSIS — E8779 Other fluid overload: Secondary | ICD-10-CM | POA: Diagnosis not present

## 2016-12-19 DIAGNOSIS — N186 End stage renal disease: Secondary | ICD-10-CM | POA: Diagnosis not present

## 2016-12-19 DIAGNOSIS — L89893 Pressure ulcer of other site, stage 3: Secondary | ICD-10-CM | POA: Diagnosis not present

## 2016-12-19 DIAGNOSIS — D631 Anemia in chronic kidney disease: Secondary | ICD-10-CM | POA: Diagnosis not present

## 2016-12-19 DIAGNOSIS — Z23 Encounter for immunization: Secondary | ICD-10-CM | POA: Diagnosis not present

## 2016-12-19 DIAGNOSIS — N2581 Secondary hyperparathyroidism of renal origin: Secondary | ICD-10-CM | POA: Diagnosis not present

## 2016-12-19 DIAGNOSIS — L89613 Pressure ulcer of right heel, stage 3: Secondary | ICD-10-CM | POA: Diagnosis not present

## 2016-12-20 ENCOUNTER — Ambulatory Visit (INDEPENDENT_AMBULATORY_CARE_PROVIDER_SITE_OTHER): Payer: BC Managed Care – PPO

## 2016-12-20 ENCOUNTER — Ambulatory Visit (INDEPENDENT_AMBULATORY_CARE_PROVIDER_SITE_OTHER): Payer: BC Managed Care – PPO | Admitting: Orthopedic Surgery

## 2016-12-20 DIAGNOSIS — S72002S Fracture of unspecified part of neck of left femur, sequela: Secondary | ICD-10-CM | POA: Diagnosis not present

## 2016-12-20 DIAGNOSIS — S72491D Other fracture of lower end of right femur, subsequent encounter for closed fracture with routine healing: Secondary | ICD-10-CM

## 2016-12-20 NOTE — Progress Notes (Signed)
Office Visit Note   Patient: [name]           Date of Birth: Sep 26, 1955           MRN: [id number] Visit Date: 12/20/2016              Requested by: No referring provider defined for this encounter. PCP: Patient, No Pcp Per  No chief complaint on file.     HPI: Patient is a 61 year old gentleman who presents in follow-up status post left periprosthetic femur fracture on the left and a right supracondylar femur fracture.  The supracondylar femur fracture in the right underwent open reduction internal fixation.  Patient is full assist with a Hoyer lift for transfers.  He has developed a larger decubitus ulcer on the left transtibial amputation.  Assessment & Plan: Visit Diagnoses:  1. Other closed fracture of distal end of right femur with routine healing, subsequent encounter   2. Traumatic closed nondisplaced fracture of neck of femur, left, sequela     Plan: Orders were written the patient may begin transfer training weightbearing on the right for transfers only no gait training.  Patient was given orders for dressing changes to the left decubitus below the knee amputation ulcer orders were written for foam padding around the ulcer to unload pressure from the ulcer..  Plan: 2 view radiographs of the left hip and 2 view radiographs of the right knee at follow-up.  Follow-Up Instructions: Return in about 3 weeks (around 01/10/2017).   Ortho Exam  Patient is alert, oriented, no adenopathy, well-dressed, normal affect, normal respiratory effort. Examination patient has a larger decubitus ulcer on the posterior aspect of his left transtibial amputation due to pressure.  He lacks about 20 degrees to full extension of the knee.  He has flexion of about 30 degrees.  There is good granulation tissue in the wound bed there is no cellulitis no abscess no exposed bone.  Examination the right lower extremity he has venous and arterial insufficiency to the right lower extremity  incisions well-healed.  Imaging: Xr Femur, Min 2 Views Right  Result Date: 12/20/2016 2 view radiographs of the right femur shows no interval callus formation.  Patient has significant calcification of the femoral arteries with a cut off just proximal to the popliteal vessels.  Xr Hip Unilat W Or W/o Pelvis 2-3 Views Left  Result Date: 12/20/2016 2 view radiographs of the left femur shows good callus formation of the femoral fracture.  No images are attached to the encounter.  Labs: Lab Results  Component Value Date   HGBA1C 5.3 03/20/2016   HGBA1C 5.3 04/29/2015   HGBA1C 6.5 (A) 12/24/2013   ESRSEDRATE 57 (H) 06/13/2010   ESRSEDRATE 40 (H) 09/26/202011   ESRSEDRATE 8 11/26/2008   CRP 4.8 (H) 09/26/202011   CRP 0.6 (H) 11/26/2008   LABURIC 8.3 (H) 11/25/2015   LABURIC 6.2 10/10/2008   LABURIC 5.6 03/10/2008   REPTSTATUS 12/01/2015 FINAL 11/26/2015   GRAMSTAIN  08/16/2012    NO WBC SEEN MODERATE SQUAMOUS EPITHELIAL CELLS PRESENT MODERATE YEAST   CULT NO GROWTH 5 DAYS 11/26/2015   LABORGA ESCHERICHIA COLI 08/03/2014    Orders:  Orders Placed This Encounter  Procedures  . XR HIP UNILAT W OR W/O PELVIS 2-3 VIEWS LEFT  . XR FEMUR, MIN 2 VIEWS RIGHT   No orders of the defined types were placed in this encounter.    Procedures: No procedures performed  Clinical Data: No additional findings.  ROS:  All other systems negative, except as noted in the HPI. Review of Systems  Objective: Vital Signs: There were no vitals taken for this visit.  Specialty Comments:  No specialty comments available.  PMFS History: Patient Active Problem List   Diagnosis Date Noted  . Non-pressure chronic ulcer of left calf, limited to breakdown of skin ([place]) 02/07/2016  . Hypotension, unspecified 11/24/2015  . Left knee pain 11/24/2015  . Hypotension 11/24/2015  . Decubitus ulcer of buttock 01/22/2015  . S/P BKA (below knee amputation) unilateral ([place]) 01/22/2015  . Chronic venous  insufficiency 09/08/2014  . Diabetes mellitus, type 2 ([place]) 09/08/2014  . ESRD on dialysis ([place]) 09/08/2014  . Hyperlipidemia 12/23/2013  . Diabetic foot ([place]) 11/20/2013  . Chronic pain 05/21/2013  . Chronic interstitial cystitis 02/06/2013  . UI (urinary incontinence) 10/29/2012  . CKD (chronic kidney disease) stage 3, GFR 30-59 ml/min (HCC) 08/18/2012  . Hypothyroidism, acquired 08/04/2012  . OBESITY 09/21/2009  . Essential hypertension 09/21/2009  . DIVERTICULOSIS OF COLON 09/21/2009  . Type 2 diabetes mellitus with diabetic nephropathy, with long-term current use of insulin ([place]) 10/01/2007  . DVT 10/01/2007   Past Medical History:  Diagnosis Date  . Anemia   . Anxiety   . AR (allergic rhinitis)   . Arthritis    osteoarthritis  . Blood transfusion   . BPH (benign prostatic hypertrophy)   . Cholelithiasis   . Chronic kidney disease    hx of kidney stones  . Chronic pain   . Diabetes mellitus    insulin dependent  . Displacement of lumbar intervertebral disc without myelopathy    L4-L5  . Diverticulosis of colon   . DVT (deep venous thrombosis) (HCC)    Lower Extremity  . Embolism and thrombosis of unspecified artery ([place])   . Gallstone   . History of nephrolithiasis    Bilateral  . Hyperlipidemia   . Hypertension   . Hypertrophy of prostate   . Hypothyroidism   . Impotence of organic origin   . Morbid obesity ([place])   . Nephrolithiasis   . Other lymphedema   . Pancreatitis, acute   . Peripheral vascular disease ([place])   . Personal history of arthritis    Osteoarthritis  . Sleep apnea    uses cpap  . Unspecified septicemia(038.9) ([place])     Family History  Problem Relation Age of Onset  . Diabetes Mother   . Heart attack Mother   . Stroke Mother   . Colon cancer Father   . Dementia Father   . Heart attack Father   . Arthritis Paternal Grandmother   . Hypertension Other   . Hypothyroidism Other     Past Surgical History:  Procedure Laterality Date    . AMPUTATION  02/07/2011   Procedure: AMPUTATION BELOW KNEE;  Surgeon: Newt Minion, MD;  Location: [facility];  Service: Orthopedics;  Laterality: Left;  Left Below Knee Amputation  . bilteral hip     replacement & revison  's   . CYSTOSCOPY W/ RETROGRADES Bilateral 10/24/2012   Procedure: CYSTOSCOPY WITH RETROGRADE PYELOGRAM Procedure: Cystoscopy, Bilateral Retrogarde Pyelograms, Bladder Biopsy, Hydrodistension;  Surgeon: Franchot Gallo, MD;  Location: [facility];  Service: Urology;  Laterality: Bilateral;  . KIDNEY SURGERY    . KNEE SURGERY     left knee    . LEG AMPUTATION BELOW KNEE    . REPLACEMENT TOTAL KNEE    . TONSILLECTOMY     Social History   Occupational History  .  Not on file.   Social History Main Topics  . Smoking status: Never Smoker  . Smokeless tobacco: Never Used  . Alcohol use No  . Drug use: No  . Sexual activity: Yes

## 2016-12-21 DIAGNOSIS — Z23 Encounter for immunization: Secondary | ICD-10-CM | POA: Diagnosis not present

## 2016-12-21 DIAGNOSIS — D631 Anemia in chronic kidney disease: Secondary | ICD-10-CM | POA: Diagnosis not present

## 2016-12-21 DIAGNOSIS — N2581 Secondary hyperparathyroidism of renal origin: Secondary | ICD-10-CM | POA: Diagnosis not present

## 2016-12-21 DIAGNOSIS — N186 End stage renal disease: Secondary | ICD-10-CM | POA: Diagnosis not present

## 2016-12-21 DIAGNOSIS — E8779 Other fluid overload: Secondary | ICD-10-CM | POA: Diagnosis not present

## 2016-12-23 DIAGNOSIS — N2581 Secondary hyperparathyroidism of renal origin: Secondary | ICD-10-CM | POA: Diagnosis not present

## 2016-12-23 DIAGNOSIS — E8779 Other fluid overload: Secondary | ICD-10-CM | POA: Diagnosis not present

## 2016-12-23 DIAGNOSIS — D631 Anemia in chronic kidney disease: Secondary | ICD-10-CM | POA: Diagnosis not present

## 2016-12-23 DIAGNOSIS — N186 End stage renal disease: Secondary | ICD-10-CM | POA: Diagnosis not present

## 2016-12-23 DIAGNOSIS — Z23 Encounter for immunization: Secondary | ICD-10-CM | POA: Diagnosis not present

## 2016-12-26 ENCOUNTER — Telehealth (INDEPENDENT_AMBULATORY_CARE_PROVIDER_SITE_OTHER): Payer: Self-pay | Admitting: Orthopedic Surgery

## 2016-12-26 NOTE — Telephone Encounter (Signed)
Patient called asked if he can get referred to a [place]. Patient said he he was reaching for the handle on the wheelchair and almost which scared him. Patient said his right leg is not healing up. Patient said [facility] was going to charge him $218.00 a day to stay an extra week. Patient said he need more therapy until he can take care of himself. The number to contact patient is [phone] or [phone] wife's number

## 2016-12-27 ENCOUNTER — Emergency Department (HOSPITAL_COMMUNITY): Payer: Medicare Other

## 2016-12-27 ENCOUNTER — Inpatient Hospital Stay (HOSPITAL_COMMUNITY)
Admission: EM | Admit: 2016-12-27 | Discharge: 2017-01-05 | DRG: 542 | Disposition: A | Discharge status: Home Health | Payer: Medicare Other | Attending: Family Medicine | Admitting: Family Medicine

## 2016-12-27 ENCOUNTER — Encounter (HOSPITAL_COMMUNITY): Payer: Self-pay | Admitting: Emergency Medicine

## 2016-12-27 DIAGNOSIS — D631 Anemia in chronic kidney disease: Secondary | ICD-10-CM | POA: Diagnosis not present

## 2016-12-27 DIAGNOSIS — F419 Anxiety disorder, unspecified: Secondary | ICD-10-CM | POA: Diagnosis present

## 2016-12-27 DIAGNOSIS — D638 Anemia in other chronic diseases classified elsewhere: Secondary | ICD-10-CM | POA: Diagnosis not present

## 2016-12-27 DIAGNOSIS — N2581 Secondary hyperparathyroidism of renal origin: Secondary | ICD-10-CM | POA: Diagnosis present

## 2016-12-27 DIAGNOSIS — E875 Hyperkalemia: Secondary | ICD-10-CM | POA: Diagnosis present

## 2016-12-27 DIAGNOSIS — D696 Thrombocytopenia, unspecified: Secondary | ICD-10-CM

## 2016-12-27 DIAGNOSIS — M7989 Other specified soft tissue disorders: Secondary | ICD-10-CM

## 2016-12-27 DIAGNOSIS — G4733 Obstructive sleep apnea (adult) (pediatric): Secondary | ICD-10-CM | POA: Diagnosis present

## 2016-12-27 DIAGNOSIS — R7982 Elevated C-reactive protein (CRP): Secondary | ICD-10-CM | POA: Diagnosis present

## 2016-12-27 DIAGNOSIS — I87321 Chronic venous hypertension (idiopathic) with inflammation of right lower extremity: Secondary | ICD-10-CM | POA: Diagnosis not present

## 2016-12-27 DIAGNOSIS — M5137 Other intervertebral disc degeneration, lumbosacral region: Secondary | ICD-10-CM

## 2016-12-27 DIAGNOSIS — Z89512 Acquired absence of left leg below knee: Secondary | ICD-10-CM | POA: Diagnosis not present

## 2016-12-27 DIAGNOSIS — Z794 Long term (current) use of insulin: Secondary | ICD-10-CM | POA: Diagnosis not present

## 2016-12-27 DIAGNOSIS — E11622 Type 2 diabetes mellitus with other skin ulcer: Secondary | ICD-10-CM | POA: Diagnosis present

## 2016-12-27 DIAGNOSIS — I890 Lymphedema, not elsewhere classified: Secondary | ICD-10-CM | POA: Diagnosis present

## 2016-12-27 DIAGNOSIS — M545 Low back pain, unspecified: Secondary | ICD-10-CM | POA: Diagnosis present

## 2016-12-27 DIAGNOSIS — M4856XA Collapsed vertebra, not elsewhere classified, lumbar region, initial encounter for fracture: Secondary | ICD-10-CM | POA: Diagnosis not present

## 2016-12-27 DIAGNOSIS — E66813 Obesity, class 3: Secondary | ICD-10-CM

## 2016-12-27 DIAGNOSIS — E1121 Type 2 diabetes mellitus with diabetic nephropathy: Secondary | ICD-10-CM

## 2016-12-27 DIAGNOSIS — L03119 Cellulitis of unspecified part of limb: Secondary | ICD-10-CM | POA: Diagnosis not present

## 2016-12-27 DIAGNOSIS — L03115 Cellulitis of right lower limb: Secondary | ICD-10-CM | POA: Diagnosis not present

## 2016-12-27 DIAGNOSIS — M546 Pain in thoracic spine: Secondary | ICD-10-CM | POA: Diagnosis not present

## 2016-12-27 DIAGNOSIS — N186 End stage renal disease: Secondary | ICD-10-CM

## 2016-12-27 DIAGNOSIS — E1122 Type 2 diabetes mellitus with diabetic chronic kidney disease: Secondary | ICD-10-CM | POA: Diagnosis present

## 2016-12-27 DIAGNOSIS — N390 Urinary tract infection, site not specified: Secondary | ICD-10-CM | POA: Diagnosis present

## 2016-12-27 DIAGNOSIS — L03116 Cellulitis of left lower limb: Secondary | ICD-10-CM | POA: Diagnosis present

## 2016-12-27 DIAGNOSIS — I959 Hypotension, unspecified: Secondary | ICD-10-CM | POA: Diagnosis present

## 2016-12-27 DIAGNOSIS — R509 Fever, unspecified: Secondary | ICD-10-CM | POA: Diagnosis not present

## 2016-12-27 DIAGNOSIS — L97929 Non-pressure chronic ulcer of unspecified part of left lower leg with unspecified severity: Secondary | ICD-10-CM | POA: Diagnosis present

## 2016-12-27 DIAGNOSIS — E039 Hypothyroidism, unspecified: Secondary | ICD-10-CM | POA: Diagnosis not present

## 2016-12-27 DIAGNOSIS — I120 Hypertensive chronic kidney disease with stage 5 chronic kidney disease or end stage renal disease: Secondary | ICD-10-CM | POA: Diagnosis present

## 2016-12-27 DIAGNOSIS — Z992 Dependence on renal dialysis: Secondary | ICD-10-CM | POA: Diagnosis not present

## 2016-12-27 DIAGNOSIS — M47816 Spondylosis without myelopathy or radiculopathy, lumbar region: Secondary | ICD-10-CM | POA: Diagnosis present

## 2016-12-27 DIAGNOSIS — I10 Essential (primary) hypertension: Secondary | ICD-10-CM | POA: Diagnosis not present

## 2016-12-27 DIAGNOSIS — G8929 Other chronic pain: Secondary | ICD-10-CM | POA: Diagnosis present

## 2016-12-27 DIAGNOSIS — E1151 Type 2 diabetes mellitus with diabetic peripheral angiopathy without gangrene: Secondary | ICD-10-CM | POA: Diagnosis present

## 2016-12-27 DIAGNOSIS — Z96652 Presence of left artificial knee joint: Secondary | ICD-10-CM | POA: Diagnosis present

## 2016-12-27 DIAGNOSIS — R0902 Hypoxemia: Secondary | ICD-10-CM | POA: Diagnosis not present

## 2016-12-27 DIAGNOSIS — Z96643 Presence of artificial hip joint, bilateral: Secondary | ICD-10-CM | POA: Diagnosis present

## 2016-12-27 DIAGNOSIS — E785 Hyperlipidemia, unspecified: Secondary | ICD-10-CM | POA: Diagnosis present

## 2016-12-27 DIAGNOSIS — Z936 Other artificial openings of urinary tract status: Secondary | ICD-10-CM

## 2016-12-27 DIAGNOSIS — Z86718 Personal history of other venous thrombosis and embolism: Secondary | ICD-10-CM | POA: Diagnosis not present

## 2016-12-27 DIAGNOSIS — Z79899 Other long term (current) drug therapy: Secondary | ICD-10-CM

## 2016-12-27 DIAGNOSIS — B9620 Unspecified Escherichia coli [E. coli] as the cause of diseases classified elsewhere: Secondary | ICD-10-CM | POA: Diagnosis present

## 2016-12-27 DIAGNOSIS — Z6838 Body mass index (BMI) 38.0-38.9, adult: Secondary | ICD-10-CM | POA: Diagnosis not present

## 2016-12-27 DIAGNOSIS — M5442 Lumbago with sciatica, left side: Secondary | ICD-10-CM

## 2016-12-27 DIAGNOSIS — N400 Enlarged prostate without lower urinary tract symptoms: Secondary | ICD-10-CM | POA: Diagnosis present

## 2016-12-27 DIAGNOSIS — M5441 Lumbago with sciatica, right side: Secondary | ICD-10-CM

## 2016-12-27 DIAGNOSIS — I87309 Chronic venous hypertension (idiopathic) without complications of unspecified lower extremity: Secondary | ICD-10-CM | POA: Diagnosis present

## 2016-12-27 DIAGNOSIS — R2241 Localized swelling, mass and lump, right lower limb: Secondary | ICD-10-CM | POA: Diagnosis not present

## 2016-12-27 DIAGNOSIS — M51379 Other intervertebral disc degeneration, lumbosacral region without mention of lumbar back pain or lower extremity pain: Secondary | ICD-10-CM

## 2016-12-27 DIAGNOSIS — Z6841 Body Mass Index (BMI) 40.0 and over, adult: Secondary | ICD-10-CM

## 2016-12-27 LAB — SEDIMENTATION RATE: SED RATE: 52 mm/h — AB (ref 0–16)

## 2016-12-27 LAB — COMPREHENSIVE METABOLIC PANEL
ALBUMIN: 2.5 g/dL — AB (ref 3.5–5.0)
ALK PHOS: 111 U/L (ref 38–126)
ALT: 9 U/L — ABNORMAL LOW (ref 17–63)
AST: 9 U/L — AB (ref 15–41)
Anion gap: 12 (ref 5–15)
BILIRUBIN TOTAL: 0.8 mg/dL (ref 0.3–1.2)
BUN: 74 mg/dL — AB (ref 6–20)
CALCIUM: 8.1 mg/dL — AB (ref 8.9–10.3)
CO2: 22 mmol/L (ref 22–32)
Chloride: 100 mmol/L — ABNORMAL LOW (ref 101–111)
Creatinine, Ser: 7.06 mg/dL — ABNORMAL HIGH (ref 0.61–1.24)
GFR calc Af Amer: 9 mL/min — ABNORMAL LOW (ref >60)
GFR calc non Af Amer: 8 mL/min — ABNORMAL LOW (ref >60)
GLUCOSE: 128 mg/dL — AB (ref 65–99)
POTASSIUM: 5.4 mmol/L — AB (ref 3.5–5.1)
SODIUM: 134 mmol/L — AB (ref 135–145)
Total Protein: 6.4 g/dL — ABNORMAL LOW (ref 6.5–8.1)

## 2016-12-27 LAB — URINALYSIS, ROUTINE W REFLEX MICROSCOPIC
BILIRUBIN URINE: NEGATIVE
Glucose, UA: NEGATIVE mg/dL
Hgb urine dipstick: NEGATIVE
KETONES UR: NEGATIVE mg/dL
Nitrite: NEGATIVE
PH: 5 (ref 5.0–8.0)
Protein, ur: NEGATIVE mg/dL
SQUAMOUS EPITHELIAL / LPF: NONE SEEN
Specific Gravity, Urine: >1.046 — ABNORMAL HIGH (ref 1.005–1.030)

## 2016-12-27 LAB — CBC WITH DIFFERENTIAL/PLATELET
BASOS ABS: 0 10*3/uL (ref 0.0–0.1)
BASOS PCT: 0 %
Eosinophils Absolute: 0.1 10*3/uL (ref 0.0–0.7)
Eosinophils Relative: 1 %
HEMATOCRIT: 28.8 % — AB (ref 39.0–52.0)
HEMOGLOBIN: 8.6 g/dL — AB (ref 13.0–17.0)
Lymphocytes Relative: 10 %
Lymphs Abs: 0.7 10*3/uL (ref 0.7–4.0)
MCH: 28.8 pg (ref 26.0–34.0)
MCHC: 29.9 g/dL — ABNORMAL LOW (ref 30.0–36.0)
MCV: 96.3 fL (ref 78.0–100.0)
Monocytes Absolute: 0.5 10*3/uL (ref 0.1–1.0)
Monocytes Relative: 7 %
NEUTROS ABS: 5.6 10*3/uL (ref 1.7–7.7)
NEUTROS PCT: 82 %
Platelets: 136 10*3/uL — ABNORMAL LOW (ref 150–400)
RBC: 2.99 MIL/uL — AB (ref 4.22–5.81)
RDW: 16.8 % — ABNORMAL HIGH (ref 11.5–15.5)
WBC: 6.9 10*3/uL (ref 4.0–10.5)

## 2016-12-27 LAB — CBG MONITORING, ED: Glucose-Capillary: 140 mg/dL — ABNORMAL HIGH (ref 65–99)

## 2016-12-27 LAB — I-STAT CG4 LACTIC ACID, ED: LACTIC ACID, VENOUS: 0.9 mmol/L (ref 0.5–1.9)

## 2016-12-27 LAB — C-REACTIVE PROTEIN: CRP: 10.4 mg/dL — AB (ref <1.0)

## 2016-12-27 MED ORDER — DEXTROSE 5 % IV SOLN
1.0000 g | Freq: Once | INTRAVENOUS | Status: AC
  Administered 2016-12-27: 1 g via INTRAVENOUS
  Filled 2016-12-27: qty 10

## 2016-12-27 MED ORDER — INSULIN ASPART 100 UNIT/ML ~~LOC~~ SOLN
0.0000 [IU] | Freq: Three times a day (TID) | SUBCUTANEOUS | Status: DC
  Administered 2016-12-28: 1 [IU] via SUBCUTANEOUS
  Administered 2016-12-28: 2 [IU] via SUBCUTANEOUS
  Administered 2016-12-29: 1 [IU] via SUBCUTANEOUS
  Administered 2016-12-29 – 2016-12-30 (×3): 2 [IU] via SUBCUTANEOUS
  Administered 2016-12-31: 3 [IU] via SUBCUTANEOUS
  Administered 2016-12-31 – 2017-01-01 (×3): 2 [IU] via SUBCUTANEOUS
  Administered 2017-01-01: 1 [IU] via SUBCUTANEOUS
  Administered 2017-01-02 – 2017-01-05 (×4): 2 [IU] via SUBCUTANEOUS

## 2016-12-27 MED ORDER — LEVOTHYROXINE SODIUM 25 MCG PO TABS: 25.0000 ug | ORAL_TABLET | Freq: Every day | ORAL | Status: DC

## 2016-12-27 MED ORDER — VITAMIN C 500 MG PO TABS
500.0000 mg | ORAL_TABLET | Freq: Two times a day (BID) | ORAL | Status: DC
  Administered 2016-12-28 – 2017-01-05 (×16): 500 mg via ORAL
  Filled 2016-12-27 (×16): qty 1

## 2016-12-27 MED ORDER — HEPARIN SODIUM (PORCINE) 5000 UNIT/ML IJ SOLN
5000.0000 [IU] | Freq: Three times a day (TID) | INTRAMUSCULAR | Status: DC
  Administered 2016-12-28 – 2017-01-05 (×20): 5000 [IU] via SUBCUTANEOUS
  Filled 2016-12-27 (×21): qty 1

## 2016-12-27 MED ORDER — ZOLPIDEM TARTRATE 5 MG PO TABS
5.0000 mg | ORAL_TABLET | Freq: Every evening | ORAL | Status: DC | PRN
  Filled 2016-12-27 (×2): qty 1

## 2016-12-27 MED ORDER — INSULIN GLARGINE 100 UNIT/ML ~~LOC~~ SOLN
28.0000 [IU] | Freq: Every day | SUBCUTANEOUS | Status: DC
  Administered 2016-12-28 – 2017-01-04 (×9): 28 [IU] via SUBCUTANEOUS
  Filled 2016-12-27 (×10): qty 0.28

## 2016-12-27 MED ORDER — ONDANSETRON HCL 4 MG PO TABS
4.0000 mg | ORAL_TABLET | Freq: Four times a day (QID) | ORAL | Status: DC | PRN
  Administered 2017-01-01: 4 mg via ORAL
  Filled 2016-12-27: qty 1

## 2016-12-27 MED ORDER — FERROUS SULFATE 325 (65 FE) MG PO TABS
325.0000 mg | ORAL_TABLET | Freq: Two times a day (BID) | ORAL | Status: DC
  Administered 2016-12-28 – 2017-01-05 (×18): 325 mg via ORAL
  Filled 2016-12-27 (×19): qty 1

## 2016-12-27 MED ORDER — LEVOTHYROXINE SODIUM 200 MCG PO TABS: 200.0000 ug | ORAL_TABLET | Freq: Every morning | ORAL | Status: DC

## 2016-12-27 MED ORDER — SODIUM POLYSTYRENE SULFONATE 15 GM/60ML PO SUSP
30.0000 g | Freq: Once | ORAL | Status: AC
  Administered 2016-12-28: 30 g via ORAL
  Filled 2016-12-27: qty 120

## 2016-12-27 MED ORDER — OMEGA-3-ACID ETHYL ESTERS 1 G PO CAPS
1.0000 g | ORAL_CAPSULE | Freq: Two times a day (BID) | ORAL | Status: DC
  Administered 2016-12-28 – 2017-01-05 (×18): 1 g via ORAL
  Filled 2016-12-27 (×19): qty 1

## 2016-12-27 MED ORDER — SENNOSIDES-DOCUSATE SODIUM 8.6-50 MG PO TABS
1.0000 | ORAL_TABLET | Freq: Every evening | ORAL | Status: DC | PRN
  Administered 2016-12-31: 1 via ORAL
  Filled 2016-12-27: qty 1

## 2016-12-27 MED ORDER — HYDRALAZINE HCL 20 MG/ML IJ SOLN: 5.0000 mg | INTRAMUSCULAR | Status: DC | PRN

## 2016-12-27 MED ORDER — VANCOMYCIN HCL IN DEXTROSE 1-5 GM/200ML-% IV SOLN
1000.0000 mg | INTRAVENOUS | Status: DC
  Administered 2016-12-28 – 2017-01-02 (×2): 1000 mg via INTRAVENOUS
  Filled 2016-12-27 (×3): qty 200

## 2016-12-27 MED ORDER — ONDANSETRON HCL 4 MG/2ML IJ SOLN
4.0000 mg | Freq: Four times a day (QID) | INTRAMUSCULAR | Status: DC | PRN
  Administered 2016-12-30: 4 mg via INTRAVENOUS
  Filled 2016-12-27: qty 2

## 2016-12-27 MED ORDER — DEXTROSE 5 % IV SOLN
1.0000 g | INTRAVENOUS | Status: DC
  Administered 2016-12-28 – 2017-01-02 (×6): 1 g via INTRAVENOUS
  Filled 2016-12-27 (×7): qty 10

## 2016-12-27 MED ORDER — METHENAMINE MANDELATE 1 G PO TABS
1.0000 g | ORAL_TABLET | Freq: Two times a day (BID) | ORAL | Status: DC
  Administered 2016-12-28 – 2016-12-30 (×6): 1 g via ORAL
  Filled 2016-12-27 (×6): qty 1

## 2016-12-27 MED ORDER — ACETAMINOPHEN 325 MG PO TABS
650.0000 mg | ORAL_TABLET | Freq: Four times a day (QID) | ORAL | Status: DC | PRN
  Administered 2016-12-28 – 2017-01-02 (×2): 650 mg via ORAL

## 2016-12-27 MED ORDER — SEVELAMER CARBONATE 800 MG PO TABS
800.0000 mg | ORAL_TABLET | Freq: Three times a day (TID) | ORAL | Status: DC
  Administered 2016-12-28 – 2017-01-05 (×22): 800 mg via ORAL
  Filled 2016-12-27 (×23): qty 1

## 2016-12-27 MED ORDER — OXYCODONE-ACETAMINOPHEN 5-325 MG PO TABS
1.0000 | ORAL_TABLET | ORAL | Status: DC | PRN
  Administered 2016-12-27 – 2017-01-03 (×15): 1 via ORAL
  Filled 2016-12-27 (×16): qty 1

## 2016-12-27 MED ORDER — ATORVASTATIN CALCIUM 40 MG PO TABS
40.0000 mg | ORAL_TABLET | Freq: Every day | ORAL | Status: DC
  Administered 2016-12-28 – 2017-01-04 (×9): 40 mg via ORAL
  Filled 2016-12-27 (×10): qty 1

## 2016-12-27 MED ORDER — ACETAMINOPHEN 650 MG RE SUPP: 650.0000 mg | Freq: Four times a day (QID) | RECTAL | Status: DC | PRN

## 2016-12-27 MED ORDER — VANCOMYCIN HCL 10 G IV SOLR
2500.0000 mg | Freq: Once | INTRAVENOUS | Status: AC
  Administered 2016-12-27: 2500 mg via INTRAVENOUS
  Filled 2016-12-27: qty 2500

## 2016-12-27 MED ORDER — ADULT MULTIVITAMIN W/MINERALS CH
1.0000 | ORAL_TABLET | Freq: Every day | ORAL | Status: DC
  Administered 2016-12-28 – 2017-01-05 (×9): 1 via ORAL
  Filled 2016-12-27 (×9): qty 1

## 2016-12-27 MED ORDER — METHOCARBAMOL 500 MG PO TABS
500.0000 mg | ORAL_TABLET | Freq: Three times a day (TID) | ORAL | Status: DC | PRN
  Administered 2016-12-29 – 2017-01-03 (×7): 500 mg via ORAL
  Filled 2016-12-27 (×8): qty 1

## 2016-12-27 NOTE — Progress Notes (Signed)
Pharmacy Antibiotic Note  OSEPH IMBURGIA is a 61 y.o. male admitted on 12/27/2016 with cellulitis.  Pharmacy has been consulted for vancomycin dosing. Afebrile, WBC normal, LA 0.90.  ESRD HD T,TH,S.  Plan: Vancomycin 2500mg  IV X1, then vancomycin 1000mg  IV after each HD session   Pre-HD  Vancomycin level as indicated, monitor for c/s, clinical resolution,  F/u de-escalation plan/LOT, F/u inpatient HD schedule/tolerance for entering maintenance doses   Weight: 275 lb (124.7 kg)  Temp (24hrs), Avg:98.6 F (37 C), Min:98.6 F (37 C), Max:98.6 F (37 C)   Recent Labs Lab 12/27/16 1600 12/27/16 1607  WBC 6.9  --   CREATININE 7.06*  --   LATICACIDVEN  --  0.90    CrCl cannot be calculated (Unknown ideal weight.).    No Known Allergies  Antimicrobials this admission: 10/31 vancomycin >>  10/31 Ceftriaxone>> Microbiology results: 10/31 UCx:     Thank you for allowing pharmacy to be a part of this patient's care.  Jerrye Noble, PharmD Candidate  12/27/2016 7:38 PM

## 2016-12-27 NOTE — H&P (Signed)
History and Physical    [name] WJX:[id number] DOB: 1955-04-02 DOA: 12/27/2016  Referring MD/NP/PA:   PCP: Patient, No Pcp Per   Patient coming from:  The patient is coming from home.  At baseline, pt is dependent for most of ADL.   Chief Complaint: lower back pain, foul smelling in urine, right leg wound and draining  HPI: [name] is a 61 y.o. male with medical history significant of hypertension, hyperlipidemia, diabetes mellitus, hypothyroidism, anxiety, OSA not on CPAP, morbid obesity, PVD, DVT, BPH, iron deficiency anemia, ESRD-HD (TTS), s/p of urostomy, s/p of left BKA, who presents with lower back pain, foul smelling in urine, right leg wound and draining.  Pt states that he had left femur and right hip fracture, and had surgery for left femur, but no surgery to the right hip. He did rehab and was just discharged from a nursing home on Monday. He states that he has severe pain in his lower back. The pain is constantly, shooting and stabbing, 10 out of 10 in severity, radiating to the left thigh. The symptoms are aggravated by certain positions, twisting and bending. Patient says he has had sciatic pain for a long time but says that the pain worsened acutely yesterday and today. He states that he has lymphedema in legs. He has ulcers and wounds on right leg with draining, but only has mild tenderness . He says that he has a urostomy and his urine has been green and very foul-smelling. Patient denies chest pain, shortness breath, cough, fever or chills. No nausea, vomiting, diarrhea or abdominal pain.  ED Course: pt was found to have WBC 6.9, lactic acid 0.90, potassium 5.4, bicarbonate 22, creatinine 7.06, BUN 24, positive urinalysis with trace amount of leukocytes and many bacteria. Pt is admitted to telemetry bed as inpatient.  # CT-L spin showed: 1.  No acute osseous injury of the lumbar spine. 2. Chronic L5 vertebral body compression fracture. 3. Lumbar spine  spondylosis as described above.  Review of Systems:   General: no fevers, chills, has fatigue HEENT: no blurry vision, hearing changes or sore throat Respiratory: no dyspnea, coughing, wheezing CV: no chest pain, no palpitations GI: no nausea, vomiting, abdominal pain, diarrhea, constipation GU: no dysuria, burning on urination, increased urinary frequency, hematuria. Has foul smell in urine. Ext: has leg edema Neuro: no unilateral weakness, numbness, or tingling, no vision change or hearing loss Skin: has skin tear and would in left leg. MSK: No muscle spasm, no deformity, no limitation of range of movement in spin Heme: No easy bruising.  Travel history: No recent long distant travel.  Allergy: No Known Allergies  Past Medical History:  Diagnosis Date  . Anemia   . Anxiety   . AR (allergic rhinitis)   . Arthritis    osteoarthritis  . Blood transfusion   . BPH (benign prostatic hypertrophy)   . Cholelithiasis   . Chronic kidney disease    hx of kidney stones  . Chronic pain   . Diabetes mellitus    insulin dependent  . Displacement of lumbar intervertebral disc without myelopathy    L4-L5  . Diverticulosis of colon   . DVT (deep venous thrombosis) (HCC)    Lower Extremity  . Embolism and thrombosis of unspecified artery ([place])   . Gallstone   . History of nephrolithiasis    Bilateral  . Hyperlipidemia   . Hypertension   . Hypertrophy of prostate   . Hypothyroidism   . Impotence  of organic origin   . Morbid obesity ([place])   . Nephrolithiasis   . Other lymphedema   . Pancreatitis, acute   . Peripheral vascular disease ([place])   . Personal history of arthritis    Osteoarthritis  . Sleep apnea    uses cpap  . Unspecified septicemia(038.9) [facility])     Past Surgical History:  Procedure Laterality Date  . AMPUTATION  02/07/2011   Procedure: AMPUTATION BELOW KNEE;  Surgeon: Newt Minion, MD;  Location: [place];  Service: Orthopedics;  Laterality: Left;  Left Below  Knee Amputation  . bilteral hip     replacement & revison  's   . CYSTOSCOPY W/ RETROGRADES Bilateral 10/24/2012   Procedure: CYSTOSCOPY WITH RETROGRADE PYELOGRAM Procedure: Cystoscopy, Bilateral Retrogarde Pyelograms, Bladder Biopsy, Hydrodistension;  Surgeon: Franchot Gallo, MD;  [facility];  Service: Urology;  Laterality: Bilateral;  . KIDNEY SURGERY    . KNEE SURGERY     left knee    . LEG AMPUTATION BELOW KNEE    . REPLACEMENT TOTAL KNEE    . TONSILLECTOMY      Social History:  reports that he has never smoked. He has never used smokeless tobacco. He reports that he does not drink alcohol or use drugs.  Family History:  Family History  Problem Relation Age of Onset  . Diabetes Mother   . Heart attack Mother   . Stroke Mother   . Colon cancer Father   . Dementia Father   . Heart attack Father   . Arthritis Paternal Grandmother   . Hypertension Other   . Hypothyroidism Other      Prior to Admission medications   Medication Sig Start Date End Date Taking? Authorizing Provider  Ascorbic Acid (VITAMIN C PO) Take 1 tablet by mouth 2 (two) times daily.    [provider]  atorvastatin (LIPITOR) 40 MG tablet TAKE 1 TABLET (40 MG TOTAL) BY MOUTH DAILY. 09/13/15   Brunetta Jeans, PA-C  B-D INS SYRINGE 0.5CC/31GX5/16 31G X 5/16" 0.5 ML MISC USE 4 TIMES A DAY FOR DIABETES (DX E11.29) 03/06/16   Shelda Pal, DO  B-D ULTRAFINE III SHORT PEN 31G X 8 MM MISC USE AS DIRECTED WITH BASAGLAR PEN 03/06/16   Wendling, Crosby Oyster, DO  ferrous sulfate 325 (65 FE) MG tablet Take 1 tablet (325 mg total) by mouth 2 (two) times daily. 08/10/14   Brunetta Jeans, PA-C  gabapentin (NEURONTIN) 100 MG capsule TAKE 1 CAPSULE (100 MG TOTAL) BY MOUTH 3 (THREE) TIMES DAILY. 12/26/15   Brunetta Jeans, PA-C  Insulin Glargine (BASAGLAR KWIKPEN) 100 UNIT/ML SOPN Inject 40 Units into the skin at bedtime. 09/23/15   [provider]  insulin lispro (HUMALOG) 100 UNIT/ML  injection INJECT 3 UNITS 3 TIMES A DAY WITH MEALS AND AT BEDTIME IF SUGAR IS GREATER THAN 150 03/29/16   Wendling, Crosby Oyster, DO  Insulin Syringe-Needle U-100 (B-D INS SYRINGE 0.5CC/31GX5/16) 31G X 5/16" 0.5 ML MISC USE 4 TIMES A DAY FOR DIABETES BLOOD GLUCOSE TESTING DX: E11.29 08/10/14   Brunetta Jeans, PA-C  levothyroxine (SYNTHROID, LEVOTHROID) 200 MCG tablet Take 1 tablet (200 mcg total) by mouth every morning. 01/10/16   Brunetta Jeans, PA-C  levothyroxine (SYNTHROID, LEVOTHROID) 25 MCG tablet TAKE 1 TABLET (25 MCG TOTAL) BY MOUTH DAILY BEFORE BREAKFAST. 09/13/15   Brunetta Jeans, PA-C  methenamine (HIPREX) 1 g tablet Take 1 g by mouth 2 (two) times daily. 09/27/15   [provider]  Multiple Vitamins-Minerals (MULTIVITAMINS THER. W/MINERALS) TABS Take 1 tablet by mouth daily.     [provider]  sevelamer carbonate (RENVELA) 800 MG tablet Take 800 mg by mouth 3 (three) times daily with meals.    [provider]    Physical Exam: Vitals:   12/27/16 1246 12/27/16 1257 12/27/16 2001  BP: (!) 124/54  (!) 115/57  Pulse: 73  86  Resp: 20  18  Temp: 98.6 F (37 C)    TempSrc: Oral    SpO2: 95%  99%  Weight:  124.7 kg (275 lb)    General: Not in acute distress HEENT:       Eyes: PERRL, EOMI, no scleral icterus.       ENT: No discharge from the ears and nose, no pharynx injection, no tonsillar enlargement.        Neck: No JVD, no bruit, no mass felt. Heme: No neck lymph node enlargement. Cardiac: S1/S2, RRR, No murmurs, No gallops or rubs. Respiratory: No rales, wheezing, rhonchi or rubs. GI: Soft, nondistended, nontender, no rebound pain, no organomegaly, BS present. S/p of urostomy with foul-smelling green discolored urine. GU: No hematuria Ext: s/p of left BKA. 1+DP/PT pulse in right leg. Has wounds on the R heel with draining,  wound on his right shin that been bandaged. The right leg is erythematous and warm.  Musculoskeletal: No joint  deformities, No joint redness or warmth, no limitation of ROM in spin. Skin: No rashes.  Neuro: Alert, oriented X3, cranial nerves II-XII grossly intact, moves all extremitie. Psych: Patient is not psychotic, no suicidal or hemocidal ideation.  Labs on Admission: I have personally reviewed following labs and imaging studies  CBC:  Recent Labs Lab 12/27/16 1600  WBC 6.9  NEUTROABS 5.6  HGB 8.6*  HCT 28.8*  MCV 96.3  PLT 191*   Basic Metabolic Panel:  Recent Labs Lab 12/27/16 1600  NA 134*  K 5.4*  CL 100*  CO2 22  GLUCOSE 128*  BUN 74*  CREATININE 7.06*  CALCIUM 8.1*   GFR: CrCl cannot be calculated (Unknown ideal weight.). Liver Function Tests:  Recent Labs Lab 12/27/16 1600  AST 9*  ALT 9*  ALKPHOS 111  BILITOT 0.8  PROT 6.4*  ALBUMIN 2.5*   No results for input(s): LIPASE, AMYLASE in the last 168 hours. No results for input(s): AMMONIA in the last 168 hours. Coagulation Profile: No results for input(s): INR, PROTIME in the last 168 hours. Cardiac Enzymes: No results for input(s): CKTOTAL, CKMB, CKMBINDEX, TROPONINI in the last 168 hours. BNP (last 3 results) No results for input(s): PROBNP in the last 8760 hours. HbA1C: No results for input(s): HGBA1C in the last 72 hours. CBG:  Recent Labs Lab 12/27/16 1257  GLUCAP 140*   Lipid Profile: No results for input(s): CHOL, HDL, LDLCALC, TRIG, CHOLHDL, LDLDIRECT in the last 72 hours. Thyroid Function Tests: No results for input(s): TSH, T4TOTAL, FREET4, T3FREE, THYROIDAB in the last 72 hours. Anemia Panel: No results for input(s): VITAMINB12, FOLATE, FERRITIN, TIBC, IRON, RETICCTPCT in the last 72 hours. Urine analysis:    Component Value Date/Time   COLORURINE AMBER (A) 12/27/2016 1601   APPEARANCEUR TURBID (A) 12/27/2016 1601   LABSPEC >1.046 (H) 12/27/2016 1601   PHURINE 5.0 12/27/2016 1601   GLUCOSEU NEGATIVE 12/27/2016 1601   HGBUR NEGATIVE 12/27/2016 1601   BILIRUBINUR NEGATIVE  12/27/2016 1601   BILIRUBINUR neg 08/10/2014 [place] 12/27/2016 1601   PROTEINUR NEGATIVE 12/27/2016 1601  UROBILINOGEN 1.0 08/10/2014 1637   UROBILINOGEN 0.2 08/03/2014 1225   NITRITE NEGATIVE 12/27/2016 1601   LEUKOCYTESUR TRACE (A) 12/27/2016 1601   Sepsis Labs: '@LABRCNTIP' (procalcitonin:4,lacticidven:4) )No results found for this or any previous visit (from the past 240 hour(s)).   Radiological Exams on Admission: Ct Lumbar Spine Wo Contrast  Result Date: 12/27/2016 CLINICAL DATA:  Status post fall July 2018. Severe low back pain for 3 days. EXAM: CT LUMBAR SPINE WITHOUT CONTRAST TECHNIQUE: Multidetector CT imaging of the lumbar spine was performed without intravenous contrast administration. Multiplanar CT image reconstructions were also generated. COMPARISON:  None. FINDINGS: Segmentation: 5 lumbar type vertebrae. Alignment: Normal. Vertebrae: Severe osteopenia. Severe L5 vertebral body compression fracture with approximately 80% anterior height loss unchanged compared with 01/13/2016. Remainder the vertebral body heights are maintained. No aggressive osseous lesion. Bone destruction. Paraspinal and other soft tissues: No acute paraspinal abnormality. Bilateral atrophic kidneys with multiple cysts. Abdominal aortic atherosclerosis. Disc levels: Disc heights are maintained. At L1-2: Mild broad-based disc bulge. Mild bilateral facet arthropathy. No foraminal stenosis. At L2-3: Mild broad-based disc bulge. Mild bilateral facet arthropathy. No foraminal stenosis. At L3-4: Mild broad-based disc bulge. Mild bilateral facet arthropathy. Mild right foraminal stenosis. No left foraminal stenosis. At L4-5: Mild broad-based disc bulge. Moderate bilateral facet arthropathy, right worse than left. Mild right foraminal stenosis. No left foraminal stenosis. Central canal stenosis. At L5-S1: Broad-based disc osteophyte complex. Mild bilateral facet arthropathy. Bilateral foraminal stenosis.  IMPRESSION: 1.  No acute osseous injury of the lumbar spine. 2. Chronic L5 vertebral body compression fracture. 3. Lumbar spine spondylosis as described above. Electronically Signed   By: Kathreen Devoid   On: 12/27/2016 16:30     EKG:  Not done in ED, will get one.   Assessment/Plan Principal Problem:   Lower back pain Active Problems:   Type 2 diabetes mellitus with diabetic nephropathy, with long-term current use of insulin (HCC)   Essential hypertension   Hyperkalemia   Hypothyroidism, acquired   Cellulitis of left leg   Hyperlipidemia   ESRD on dialysis [facility])   UTI (urinary tract infection)   Lower back pain: CT-L spin showed chronic L5 vertebral body compression fracture and Lumbar spine spondylosis. Pt has severe back pain. He seems not be able to take care himself at home and my need rehab at [facility]. -will admit to tele bed as inpt -prn percocet and Robaxin -continue Neurontin PT/OT -CM and SW consult  Possible UTI (urinary tract infection): s/p of urostomy. UA is positive. Pt had positive urine culture for E Coli which were resistant to multiple Abx on 08/03/14 - start Rocephin and vanco bu IV -f/u Bx and Ux.  Possible cellulitis of left leg: No fever or leukocytosis. Not septic. Lactic acid is normal. -On recephin and vancomycin -f/u Bx and Ux -wound care consult -check Crp and ESR -get LE doppler to r/o DVT  Type 2 diabetes mellitus with diabetic nephropathy, with long-term current use of insulin ([place]): Last A1c 5.3, well controled. Patient is taking glargin and Novolog at home -will decrease glargin dose from 40 to 28 units daily -SSI  Essential hypertension: bp 124/54. Not taking Bp meds at home -IV hydralazine when necessary  ESRD on dialysis (TTS): missed Tuesday HD. potassium 5.4, bicarbonate 22, creatinine 7.06, BUN 24. Pt does not have respiratory distress, I don't think patient needs urgent dialysis tonight. -Left message to renal box for HD -Continue  Renvela  Hyperkalemia: mild, K=5.4 -will get EKG -give Kayexalate 30 g 1  Hypothyroidism: Last TSH was 5.94 on 01/21/14 -Continue home Synthroid  HLD -Lipitor  DVT ppx: SQ Heparin Code Status: Full code Family Communication: None at bed side.   Disposition Plan:  Anticipate discharge back to previous home environment Consults called:  None Admission status:   Inpatient/tele      Date of Service 12/27/2016    Ivor Costa [facility] Pager [phone]  If 7PM-7AM, please contact night-coverage [email or web address] Password [email or web address] 12/27/2016, 9:18 PM

## 2016-12-27 NOTE — ED Provider Notes (Signed)
61 y.o. Male h.o. Dm, esrd, missed dialysis yesterday home from [facility] after 100 days due to bilateral lower extremity fractures (keft hip and right femure) s/p left bka, now home with wife and c.o. Severe back pain after transfer ysterday on left with sciatica- ct done with no report of acute injury but chronic compression fx.  Pain improved.    Urinalysis significant for urinary tract infection And admission for 1 cellulitis left lower BKA area and right lower extremity 2 renal failure with hyperkalemia and will require his standard dialysis but not emergent 3 urinary tract infection with ostomy tube in place   Pattricia Boss, MD 12/30/16 1507

## 2016-12-27 NOTE — ED Triage Notes (Signed)
To ED from home via [facility], with pt c/o back pain "it is my sciatic nerve". Pt was discharged from [facility]) on Monday after having fx right hip-- pt has not taken any meds since discharge,  Pt is ESRD - dialysis on T, TH, Sat-- has not had dialysis since Saturday.  Has not taken any of home meds since discharge from nursing home.  Pt also has urostomy-- urine is milky green colored-- pt states that is from a yeast infection

## 2016-12-27 NOTE — Progress Notes (Signed)
CSW received call from EDP who stated EDP ws inquiring about possible placement options bit as of now it seems as pt may be admitted. CSW asked EDP to call back if CSW is needed.  Please reconsult if future social work needs arise.  CSW signing off, as social work intervention is no longer needed.  Alphonse Guild. Joanna Hall, LCSW, LCAS, CSI Clinical Social Worker Ph: [phone]

## 2016-12-27 NOTE — ED Provider Notes (Signed)
[facility] Provider Note   CSN: [id number] Arrival date & time: 12/27/16  1246     History   Chief Complaint Chief Complaint  Patient presents with  . Back Pain  . Chronic Renal Failure    HPI [name] is a 61 y.o. male.  The history is provided by the patient, the spouse and medical records.  Back Pain   This is a recurrent problem. The current episode started 2 days ago. The problem occurs constantly. The problem has been gradually improving. The pain is associated with twisting (Trying to self transfer after discharged home). The pain is present in the lumbar spine. The quality of the pain is described as shooting and stabbing. The pain radiates to the left thigh. The pain is at a severity of 10/10. The pain is severe. The symptoms are aggravated by certain positions, twisting and bending. The pain is the same all the time. Pertinent negatives include no chest pain, no fever, no numbness, no headaches, no abdominal pain, no abdominal swelling, no leg pain, no paresthesias, no tingling and no weakness. He has tried nothing for the symptoms. The treatment provided no relief. Risk factors include obesity.    Past Medical History:  Diagnosis Date  . Anemia   . Anxiety   . AR (allergic rhinitis)   . Arthritis    osteoarthritis  . Blood transfusion   . BPH (benign prostatic hypertrophy)   . Cholelithiasis   . Chronic kidney disease    hx of kidney stones  . Chronic pain   . Diabetes mellitus    insulin dependent  . Displacement of lumbar intervertebral disc without myelopathy    L4-L5  . Diverticulosis of colon   . DVT (deep venous thrombosis) (HCC)    Lower Extremity  . Embolism and thrombosis of unspecified artery ([place])   . Gallstone   . History of nephrolithiasis    Bilateral  . Hyperlipidemia   . Hypertension   . Hypertrophy of prostate   . Hypothyroidism   . Impotence of organic origin   . Morbid obesity ([place])     . Nephrolithiasis   . Other lymphedema   . Pancreatitis, acute   . Peripheral vascular disease ([place])   . Personal history of arthritis    Osteoarthritis  . Sleep apnea    uses cpap  . Unspecified septicemia(038.9) [facility])     Patient Active Problem List   Diagnosis Date Noted  . Non-pressure chronic ulcer of left calf, limited to breakdown of skin ([place]) 02/07/2016  . Hypotension, unspecified 11/24/2015  . Left knee pain 11/24/2015  . Hypotension 11/24/2015  . Decubitus ulcer of buttock 01/22/2015  . S/P BKA (below knee amputation) unilateral ([place]) 01/22/2015  . Chronic venous insufficiency 09/08/2014  . Diabetes mellitus, type 2 ([place]) 09/08/2014  . ESRD on dialysis ([place]) 09/08/2014  . Hyperlipidemia 12/23/2013  . Diabetic foot ([employer]) 11/20/2013  . Chronic pain 05/21/2013  . Chronic interstitial cystitis 02/06/2013  . UI (urinary incontinence) 10/29/2012  . CKD (chronic kidney disease) stage 3, GFR 30-59 ml/min (HCC) 08/18/2012  . Hypothyroidism, acquired 08/04/2012  . OBESITY 09/21/2009  . Essential hypertension 09/21/2009  . DIVERTICULOSIS OF COLON 09/21/2009  . Type 2 diabetes mellitus with diabetic nephropathy, with long-term current use of insulin ([place]) 10/01/2007  . DVT 10/01/2007    Past Surgical History:  Procedure Laterality Date  . AMPUTATION  02/07/2011   Procedure: AMPUTATION BELOW KNEE;  Surgeon: Newt Minion,  MD;  Location: [facility];  Service: Orthopedics;  Laterality: Left;  Left Below Knee Amputation  . bilteral hip     replacement & revison  's   . CYSTOSCOPY W/ RETROGRADES Bilateral 10/24/2012   Procedure: CYSTOSCOPY WITH RETROGRADE PYELOGRAM Procedure: Cystoscopy, Bilateral Retrogarde Pyelograms, Bladder Biopsy, Hydrodistension;  Surgeon: Franchot Gallo, MD;  Location: [facility];  Service: Urology;  Laterality: Bilateral;  . KIDNEY SURGERY    . KNEE SURGERY     left knee    . LEG AMPUTATION BELOW KNEE    . REPLACEMENT TOTAL KNEE    . TONSILLECTOMY          Home Medications    Prior to Admission medications   Medication Sig Start Date End Date Taking? Authorizing Provider  Ascorbic Acid (VITAMIN C PO) Take 1 tablet by mouth 2 (two) times daily.    [provider]  atorvastatin (LIPITOR) 40 MG tablet TAKE 1 TABLET (40 MG TOTAL) BY MOUTH DAILY. 09/13/15   Brunetta Jeans, PA-C  B-D INS SYRINGE 0.5CC/31GX5/16 31G X 5/16" 0.5 ML MISC USE 4 TIMES A DAY FOR DIABETES (DX E11.29) 03/06/16   Shelda Pal, DO  B-D ULTRAFINE III SHORT PEN 31G X 8 MM MISC USE AS DIRECTED WITH BASAGLAR PEN 03/06/16   Wendling, Crosby Oyster, DO  ferrous sulfate 325 (65 FE) MG tablet Take 1 tablet (325 mg total) by mouth 2 (two) times daily. 08/10/14   Brunetta Jeans, PA-C  gabapentin (NEURONTIN) 100 MG capsule TAKE 1 CAPSULE (100 MG TOTAL) BY MOUTH 3 (THREE) TIMES DAILY. 12/26/15   Brunetta Jeans, PA-C  Insulin Glargine (BASAGLAR KWIKPEN) 100 UNIT/ML SOPN Inject 40 Units into the skin at bedtime. 09/23/15   [provider]  insulin lispro (HUMALOG) 100 UNIT/ML injection INJECT 3 UNITS 3 TIMES A DAY WITH MEALS AND AT BEDTIME IF SUGAR IS GREATER THAN 150 03/29/16   Wendling, Crosby Oyster, DO  Insulin Syringe-Needle U-100 (B-D INS SYRINGE 0.5CC/31GX5/16) 31G X 5/16" 0.5 ML MISC USE 4 TIMES A DAY FOR DIABETES BLOOD GLUCOSE TESTING DX: E11.29 08/10/14   Brunetta Jeans, PA-C  levothyroxine (SYNTHROID, LEVOTHROID) 200 MCG tablet Take 1 tablet (200 mcg total) by mouth every morning. 01/10/16   Brunetta Jeans, PA-C  levothyroxine (SYNTHROID, LEVOTHROID) 25 MCG tablet TAKE 1 TABLET (25 MCG TOTAL) BY MOUTH DAILY BEFORE BREAKFAST. 09/13/15   Brunetta Jeans, PA-C  methenamine (HIPREX) 1 g tablet Take 1 g by mouth 2 (two) times daily. 09/27/15   [provider]  Multiple Vitamins-Minerals (MULTIVITAMINS THER. W/MINERALS) TABS Take 1 tablet by mouth daily.     [provider]  sevelamer carbonate (RENVELA) 800 MG tablet Take 800 mg  by mouth 3 (three) times daily with meals.    [provider]    Family History Family History  Problem Relation Age of Onset  . Diabetes Mother   . Heart attack Mother   . Stroke Mother   . Colon cancer Father   . Dementia Father   . Heart attack Father   . Arthritis Paternal Grandmother   . Hypertension Other   . Hypothyroidism Other     Social History Social History  Substance Use Topics  . Smoking status: Never Smoker  . Smokeless tobacco: Never Used  . Alcohol use No     Allergies   Patient has no known allergies.   Review of Systems Review of Systems  Constitutional: Negative for chills, diaphoresis, fatigue and fever.  HENT: Negative for congestion.   Eyes: Negative for visual disturbance.  Respiratory: Negative for cough, chest tightness, shortness of breath, wheezing and stridor.   Cardiovascular: Negative for chest pain, palpitations and leg swelling.  Gastrointestinal: Negative for abdominal pain, constipation, diarrhea, nausea and vomiting.  Genitourinary: Positive for flank pain. Negative for enuresis.  Musculoskeletal: Positive for back pain. Negative for neck pain and neck stiffness.  Skin: Negative for rash and wound.  Neurological: Negative for tingling, weakness, light-headedness, numbness, headaches and paresthesias.  Psychiatric/Behavioral: Negative for agitation and confusion.  All other systems reviewed and are negative.    Physical Exam Updated Vital Signs BP (!) 124/54 (BP Location: Left Arm)   Pulse 73   Temp 98.6 F (37 C) (Oral)   Resp 20   Wt 124.7 kg (275 lb)   SpO2 95%   BMI 38.35 kg/m   Physical Exam  Constitutional: He appears well-developed and well-nourished. No distress.  HENT:  Head: Normocephalic and atraumatic.  Mouth/Throat: Oropharynx is clear and moist. No oropharyngeal exudate.  Eyes: Pupils are equal, round, and reactive to light. Conjunctivae and EOM are normal.  Neck: Normal range of motion. Neck  supple.  Cardiovascular: Normal rate and intact distal pulses.   No murmur heard. Pulmonary/Chest: Effort normal. No stridor. No respiratory distress. He has no wheezes. He has rales. He exhibits no tenderness.  Abdominal: Soft. There is no tenderness.  Urostomy in place with cloudy green urine.  Musculoskeletal: He exhibits tenderness. He exhibits no edema.       Back:       Right lower leg: He exhibits no tenderness.       Legs:      Right foot: There is no tenderness.       Feet:  Neurological: He is alert. No sensory deficit. He exhibits normal muscle tone.  Skin: Skin is warm and dry. Capillary refill takes less than 2 seconds. He is not diaphoretic. There is erythema. No pallor.  Psychiatric: He has a normal mood and affect.  Nursing note and vitals reviewed.    ED Treatments / Results  Labs (all labs ordered are listed, but only abnormal results are displayed) Labs Reviewed  CBC WITH DIFFERENTIAL/PLATELET - Abnormal; Notable for the following:       Result Value   RBC 2.99 (*)    Hemoglobin 8.6 (*)    HCT 28.8 (*)    MCHC 29.9 (*)    RDW 16.8 (*)    Platelets 136 (*)    All other components within normal limits  COMPREHENSIVE METABOLIC PANEL - Abnormal; Notable for the following:    Sodium 134 (*)    Potassium 5.4 (*)    Chloride 100 (*)    Glucose, Bld 128 (*)    BUN 74 (*)    Creatinine, Ser 7.06 (*)    Calcium 8.1 (*)    Total Protein 6.4 (*)    Albumin 2.5 (*)    AST 9 (*)    ALT 9 (*)    GFR calc non Af Amer 8 (*)    GFR calc Af Amer 9 (*)    All other components within normal limits  URINALYSIS, ROUTINE W REFLEX MICROSCOPIC - Abnormal; Notable for the following:    Color, Urine AMBER (*)    APPearance TURBID (*)    Specific Gravity, Urine >1.046 (*)    Leukocytes, UA TRACE (*)    Bacteria, UA MANY (*)    All other components within normal  limits  C-REACTIVE PROTEIN - Abnormal; Notable for the following:    CRP 10.4 (*)    All other components  within normal limits  CBG MONITORING, ED - Abnormal; Notable for the following:    Glucose-Capillary 140 (*)    All other components within normal limits  URINE CULTURE  CULTURE, BLOOD (ROUTINE X 2)  CULTURE, BLOOD (ROUTINE X 2)  SEDIMENTATION RATE  HIV ANTIBODY (ROUTINE TESTING)  I-STAT CG4 LACTIC ACID, ED    EKG  EKG Interpretation None       Radiology Ct Lumbar Spine Wo Contrast  Result Date: 12/27/2016 CLINICAL DATA:  Status post fall July 2018. Severe low back pain for 3 days. EXAM: CT LUMBAR SPINE WITHOUT CONTRAST TECHNIQUE: Multidetector CT imaging of the lumbar spine was performed without intravenous contrast administration. Multiplanar CT image reconstructions were also generated. COMPARISON:  None. FINDINGS: Segmentation: 5 lumbar type vertebrae. Alignment: Normal. Vertebrae: Severe osteopenia. Severe L5 vertebral body compression fracture with approximately 80% anterior height loss unchanged compared with 01/13/2016. Remainder the vertebral body heights are maintained. No aggressive osseous lesion. Bone destruction. Paraspinal and other soft tissues: No acute paraspinal abnormality. Bilateral atrophic kidneys with multiple cysts. Abdominal aortic atherosclerosis. Disc levels: Disc heights are maintained. At L1-2: Mild broad-based disc bulge. Mild bilateral facet arthropathy. No foraminal stenosis. At L2-3: Mild broad-based disc bulge. Mild bilateral facet arthropathy. No foraminal stenosis. At L3-4: Mild broad-based disc bulge. Mild bilateral facet arthropathy. Mild right foraminal stenosis. No left foraminal stenosis. At L4-5: Mild broad-based disc bulge. Moderate bilateral facet arthropathy, right worse than left. Mild right foraminal stenosis. No left foraminal stenosis. Central canal stenosis. At L5-S1: Broad-based disc osteophyte complex. Mild bilateral facet arthropathy. Bilateral foraminal stenosis. IMPRESSION: 1.  No acute osseous injury of the lumbar spine. 2. Chronic L5  vertebral body compression fracture. 3. Lumbar spine spondylosis as described above. Electronically Signed   By: Kathreen Devoid   On: 12/27/2016 16:30    Procedures Procedures (including critical care time)  Medications Ordered in ED Medications  cefTRIAXone (ROCEPHIN) 1 g in dextrose 5 % 50 mL IVPB (not administered)  levothyroxine (SYNTHROID, LEVOTHROID) tablet 200 mcg (not administered)  vitamin C (ASCORBIC ACID) tablet 500 mg (not administered)  BASAGLAR KWIKPEN KwikPen 28 Units (not administered)  methenamine (HIPREX) tablet 1 g (not administered)  atorvastatin (LIPITOR) tablet 40 mg (not administered)  levothyroxine (SYNTHROID, LEVOTHROID) tablet 25 mcg (not administered)  ferrous sulfate tablet 325 mg (not administered)  sevelamer carbonate (RENVELA) tablet 800 mg (not administered)  multivitamins ther. w/minerals tablet 1 tablet (not administered)  sodium polystyrene (KAYEXALATE) 15 GM/60ML suspension 30 g (not administered)  cefTRIAXone (ROCEPHIN) 1 g in dextrose 5 % 50 mL IVPB (not administered)  oxyCODONE-acetaminophen (PERCOCET/ROXICET) 5-325 MG per tablet 1 tablet (not administered)  methocarbamol (ROBAXIN) tablet 500 mg (not administered)  heparin injection 5,000 Units (not administered)  acetaminophen (TYLENOL) tablet 650 mg (not administered)    Or  acetaminophen (TYLENOL) suppository 650 mg (not administered)  ondansetron (ZOFRAN) tablet 4 mg (not administered)    Or  ondansetron (ZOFRAN) injection 4 mg (not administered)  senna-docusate (Senokot-S) tablet 1 tablet (not administered)  hydrALAZINE (APRESOLINE) injection 5 mg (not administered)  zolpidem (AMBIEN) tablet 5 mg (not administered)  vancomycin (VANCOCIN) 2,500 mg in sodium chloride 0.9 % 500 mL IVPB (not administered)  vancomycin (VANCOCIN) IVPB 1000 mg/200 mL premix (not administered)  insulin aspart (novoLOG) injection 0-9 Units (not administered)  omega-3 acid ethyl esters (LOVAZA) capsule 1 g (not  administered)     Initial Impression / Assessment and Plan / ED Course  I have reviewed the triage vital signs and the nursing notes.  Pertinent labs & imaging results that were available during my care of the patient were reviewed by me and considered in my medical decision making (see chart for details).     [name] is a 60 y.o. male with a past medical history significant for hypertension, diabetes, hyperlipidemia, ESRD on dialysis T/T/S and prior DVT as well as bilateral femur fractures several months ago who presents with severe low back pain with left-sided sciatica and extremely foul-smelling urine.  Patient reports that he was in a rehab facility for 100 days that ended on Monday.  He says that he was in the rehab facility after he had a left hip fracture and a right femur fracture.  He was discharged back home with his wife on Monday and since then has been having a difficult time with ambulation.  He has a left below the knee amputation and the right leg has ulcers and wounds on his heel and shin.  He reports that he is barely able to place weight on his leg for transfers.  He says that he has a urostomy and his urine has been green and very foul-smelling.  He reports that at one point he was told it was a fungal infection.  He denies severe pain in his abdomen but does report the pain in his left flank and left back.  Patient says he has had sciatic pain for a long time but says that the pain worsened acutely yesterday and today.  He says that the pain prevented him from getting to dialysis yesterday.  He denies worsened leg swelling shortness of breath or chest pain.  His primary complaint today was the left low back pain in the urine changes.  On exam, patient has wounds on the R heel that appears to be draining.  Patient says that wound care has been managing it.  Patient also has a wound on his right shin that been bandaged.  He has minimal tenderness in these locations.   Patient's hips are nontender on my exam.  Patient had crackles in both lungs but no difficulty breathing.  Patient did not have significant back tenderness on my exam but he reports the pain yesterday and today was excruciating with a sciatic pain going down his left leg.  Since urostomy had a very foul-smelling green discolored urine.  Made based on patient's foul-smelling urine, suspect UTI.  With the flank pain and back pain, cannot rule out a hollow nephritis leading to his new flank pain.  Patient will have laboratory testing and urinalysis for this.  Due to the patient worsened low back pain and sciatica, patient will have imaging of his lumbar spine.  Patient says that he has not been imaged in quite some time and has had severe pain when he was trying to transfer himself.  Patient may have reinjured his back during movements.  Patient will have workup to look for pyelonephritis, infection, or worsened back injury.  The patient is found to have infection, suspect patient may need admission for further management and determine his PT/OT needs with his severe pain and near immobility.  Urinalysis showed evidence of infection. Patient will be given dose of antibiotics.  Patient skin was reassessed and he does have evidence of erythema on his left stump.  Also worsening erythema in his right leg.  CT  imaging showed no acute findings.  Given his cellulitis and urinary tract infection as well as his mobility problems and severe back pain, patient will require admission.  Care transferred to Dr. Jeanell Sparrow while awaiting laboratory testing and admission.  Care transferred in stable condition.   Final Clinical Impressions(s) / ED Diagnoses   Final diagnoses:  Right leg swelling    Clinical Impression: 1. Right leg swelling     Disposition: Care transferred to Dr. Jeanell Sparrow while awaiting final lab testing prior to admission    Camie Hauss, Gwenyth Allegra, MD 12/27/16 2133

## 2016-12-28 ENCOUNTER — Encounter (HOSPITAL_COMMUNITY): Payer: Medicare Other

## 2016-12-28 ENCOUNTER — Encounter (HOSPITAL_COMMUNITY): Payer: Self-pay

## 2016-12-28 DIAGNOSIS — I10 Essential (primary) hypertension: Secondary | ICD-10-CM | POA: Diagnosis not present

## 2016-12-28 DIAGNOSIS — D631 Anemia in chronic kidney disease: Secondary | ICD-10-CM | POA: Diagnosis not present

## 2016-12-28 DIAGNOSIS — N186 End stage renal disease: Secondary | ICD-10-CM | POA: Diagnosis not present

## 2016-12-28 LAB — HIV ANTIBODY (ROUTINE TESTING W REFLEX): HIV SCREEN 4TH GENERATION: NONREACTIVE

## 2016-12-28 LAB — MRSA PCR SCREENING: MRSA by PCR: POSITIVE — AB

## 2016-12-28 LAB — GLUCOSE, CAPILLARY
GLUCOSE-CAPILLARY: 160 mg/dL — AB (ref 65–99)
Glucose-Capillary: 123 mg/dL — ABNORMAL HIGH (ref 65–99)
Glucose-Capillary: 125 mg/dL — ABNORMAL HIGH (ref 65–99)

## 2016-12-28 LAB — CBG MONITORING, ED: GLUCOSE-CAPILLARY: 168 mg/dL — AB (ref 65–99)

## 2016-12-28 MED ORDER — LEVOTHYROXINE SODIUM 125 MCG PO TABS
225.0000 ug | ORAL_TABLET | Freq: Every day | ORAL | Status: DC
  Administered 2016-12-28 – 2017-01-05 (×9): 225 ug via ORAL
  Filled 2016-12-28 (×10): qty 1

## 2016-12-28 MED ORDER — CHLORHEXIDINE GLUCONATE CLOTH 2 % EX PADS
6.0000 | MEDICATED_PAD | Freq: Every day | CUTANEOUS | Status: AC
  Administered 2016-12-29: 6 via TOPICAL

## 2016-12-28 MED ORDER — LIDOCAINE HCL (PF) 1 % IJ SOLN: 5.0000 mL | INTRAMUSCULAR | Status: DC | PRN

## 2016-12-28 MED ORDER — MIDODRINE HCL 5 MG PO TABS
ORAL_TABLET | ORAL | Status: AC
  Filled 2016-12-28: qty 2

## 2016-12-28 MED ORDER — MIDODRINE HCL 5 MG PO TABS
10.0000 mg | ORAL_TABLET | Freq: Every day | ORAL | Status: DC
  Administered 2016-12-28 – 2017-01-05 (×9): 10 mg via ORAL
  Filled 2016-12-28 (×7): qty 2

## 2016-12-28 MED ORDER — MUPIROCIN 2 % EX OINT
1.0000 "application " | TOPICAL_OINTMENT | Freq: Two times a day (BID) | CUTANEOUS | Status: AC
  Administered 2016-12-28 – 2017-01-02 (×10): 1 via NASAL
  Filled 2016-12-28 (×4): qty 22

## 2016-12-28 MED ORDER — PENTAFLUOROPROP-TETRAFLUOROETH EX AERO: 1.0000 "application " | INHALATION_SPRAY | CUTANEOUS | Status: DC | PRN

## 2016-12-28 MED ORDER — LIDOCAINE-PRILOCAINE 2.5-2.5 % EX CREA: 1.0000 "application " | TOPICAL_CREAM | CUTANEOUS | Status: DC | PRN

## 2016-12-28 MED ORDER — SODIUM CHLORIDE 0.9 % IV SOLN: 100.0000 mL | INTRAVENOUS | Status: DC | PRN

## 2016-12-28 MED ORDER — ACETAMINOPHEN 325 MG PO TABS
ORAL_TABLET | ORAL | Status: AC
  Administered 2016-12-28: 650 mg via ORAL
  Filled 2016-12-28: qty 2

## 2016-12-28 MED ORDER — HEPARIN SODIUM (PORCINE) 1000 UNIT/ML DIALYSIS: 1000.0000 [IU] | INTRAMUSCULAR | Status: DC | PRN

## 2016-12-28 NOTE — Progress Notes (Signed)
CM received consult : home health needs. PT/OT evaluations pending...Marland KitchenCM to f/u with disposition needs. Whitman Hero RN,BSN,CM
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
## 2016-12-28 NOTE — Consult Note (Signed)
[facility] Renal Consultation Note    Indication for Consultation:  Management of ESRD/hemodialysis; anemia, hypertension/volume and secondary hyperparathyroidism  HPI: [name] is a 61 y.o. male with ESRD on HD TTS, HTN, DM, HLD, hx nephroliths, s/p urostomy, s/p L BKA, s/p R femur fracture repair 08/2016.   He presented to [facility] ED yesterday with low back pain after falling while transferring out his wheelchair. In the ED was found to have foul smelling urine, with UA suggesting UTI, and drainage from R leg wound.  IV Rocephin started. Blood and urine cultures pending. Lumbar spine CT showed chronic L5 vertebral body compression fracture and lumbar spine spondylosis. He is admitted for further treatment   Seen in room eating breakfast. Reports chronic sciatica that comes and goes, says back pain has resolved this morning. He has a lower leg wound with drainage that he says is chronic. He has no other complaints, but does want to know if he can be referred to a new rehab center. He had recently been discharged home feels he needs assistance. Denies fever, chills, CP, SOB, N,V,D.   Dialyzes at [facility] TTS. Due for HD today. His last dialysis was Saturday. He missed Tuesday because he missed his transport bus, had trouble getting ready. Denies any issues on dialysis recently.    Past Medical History:  Diagnosis Date  . Anemia   . Anxiety   . AR (allergic rhinitis)   . Arthritis    osteoarthritis  . Blood transfusion   . BPH (benign prostatic hypertrophy)   . Cholelithiasis   . Chronic kidney disease    hx of kidney stones  . Chronic pain   . Diabetes mellitus    insulin dependent  . Displacement of lumbar intervertebral disc without myelopathy    L4-L5  . Diverticulosis of colon   . DVT (deep venous thrombosis) (HCC)    Lower Extremity  . Embolism and thrombosis of unspecified artery ([place])   . Gallstone   . History of nephrolithiasis     Bilateral  . Hyperlipidemia   . Hypertension   . Hypertrophy of prostate   . Hypothyroidism   . Impotence of organic origin   . Morbid obesity ([place])   . Nephrolithiasis   . Other lymphedema   . Pancreatitis, acute   . Peripheral vascular disease ([place])   . Personal history of arthritis    Osteoarthritis  . Sleep apnea    uses cpap  . Unspecified septicemia(038.9) [facility])    Past Surgical History:  Procedure Laterality Date  . AMPUTATION  02/07/2011   Procedure: AMPUTATION BELOW KNEE;  Surgeon: Newt Minion, MD;  Location: [place];  Service: Orthopedics;  Laterality: Left;  Left Below Knee Amputation  . bilteral hip     replacement & revison  's   . CYSTOSCOPY W/ RETROGRADES Bilateral 10/24/2012   Procedure: CYSTOSCOPY WITH RETROGRADE PYELOGRAM Procedure: Cystoscopy, Bilateral Retrogarde Pyelograms, Bladder Biopsy, Hydrodistension;  Surgeon: Franchot Gallo, MD;  Location: [facility];  Service: Urology;  Laterality: Bilateral;  . KIDNEY SURGERY    . KNEE SURGERY     left knee    . LEG AMPUTATION BELOW KNEE    . REPLACEMENT TOTAL KNEE    . TONSILLECTOMY     Family History  Problem Relation Age of Onset  . Diabetes Mother   . Heart attack Mother   . Stroke Mother   . Colon cancer Father   . Dementia Father   .  Heart attack Father   . Arthritis Paternal Grandmother   . Hypertension Other   . Hypothyroidism Other    Social History:  reports that he has never smoked. He has never used smokeless tobacco. He reports that he does not drink alcohol or use drugs. No Known Allergies Prior to Admission medications   Medication Sig Start Date End Date Taking? Authorizing Provider  Ascorbic Acid (VITAMIN C PO) Take 1 tablet by mouth 2 (two) times daily.   Yes [provider]  atorvastatin (LIPITOR) 40 MG tablet TAKE 1 TABLET (40 MG TOTAL) BY MOUTH DAILY. 09/13/15  Yes Brunetta Jeans, PA-C  ferrous sulfate 325 (65 FE) MG tablet Take 1 tablet (325 mg total) by mouth 2 (two)  times daily. 08/10/14  Yes Brunetta Jeans, PA-C  gabapentin (NEURONTIN) 100 MG capsule TAKE 1 CAPSULE (100 MG TOTAL) BY MOUTH 3 (THREE) TIMES DAILY. 12/26/15  Yes Brunetta Jeans, PA-C  insulin aspart (NOVOLOG) 100 UNIT/ML injection Inject as directed 2 (two) times daily. Sliding scale 09/27/16  Yes [provider]  Insulin Glargine (BASAGLAR KWIKPEN) 100 UNIT/ML SOPN Inject 40 Units into the skin at bedtime. 09/23/15  Yes [provider]  levothyroxine (SYNTHROID, LEVOTHROID) 200 MCG tablet Take 1 tablet (200 mcg total) by mouth every morning. Patient taking differently: Take 225 mcg by mouth every morning.  01/10/16  Yes Brunetta Jeans, PA-C  methenamine (HIPREX) 1 g tablet Take 1 g by mouth 2 (two) times daily. 09/27/15  Yes [provider]  midodrine (PROAMATINE) 5 MG tablet Take 5 mg by mouth. 06/02/16  Yes [provider]  Multiple Vitamins-Minerals (MULTIVITAMINS THER. W/MINERALS) TABS Take 1 tablet by mouth daily.    Yes [provider]  omega-3 acid ethyl esters (LOVAZA) 1 g capsule Take 1 g by mouth 2 (two) times daily.   Yes [provider]  sevelamer carbonate (RENVELA) 800 MG tablet Take 800 mg by mouth 3 (three) times daily with meals.   Yes [provider]   Current Facility-Administered Medications  Medication Dose Route Frequency Provider Last Rate Last Dose  . acetaminophen (TYLENOL) tablet 650 mg  650 mg Oral Q6H PRN Ivor Costa, MD       Or  . acetaminophen (TYLENOL) suppository 650 mg  650 mg Rectal Q6H PRN Ivor Costa, MD      . atorvastatin (LIPITOR) tablet 40 mg  40 mg Oral q1800 Ivor Costa, MD   40 mg at 12/28/16 0242  . cefTRIAXone (ROCEPHIN) 1 g in dextrose 5 % 50 mL IVPB  1 g Intravenous Q24H Ivor Costa, MD      . ferrous sulfate tablet 325 mg  325 mg Oral BID Ivor Costa, MD   325 mg at 12/28/16 1001  . heparin injection 5,000 Units  5,000 Units Subcutaneous Q8H Ivor Costa, MD   5,000 Units at 12/28/16 [phone]   . hydrALAZINE (APRESOLINE) injection 5 mg  5 mg Intravenous Q2H PRN Ivor Costa, MD      . insulin aspart (novoLOG) injection 0-9 Units  0-9 Units Subcutaneous TID WC Ivor Costa, MD   1 Units at 12/28/16 1001  . insulin glargine (LANTUS) injection 28 Units  28 Units Subcutaneous QHS Ivor Costa, MD   28 Units at 12/28/16 0243  . levothyroxine (SYNTHROID, LEVOTHROID) tablet 225 mcg  225 mcg Oral QAC breakfast Dana Allan I, MD   225 mcg at 12/28/16 0656  . methenamine (MANDELAMINE) tablet 1 g  1 g Oral BID  Ivor Costa, MD   1 g at 12/28/16 1001  . methocarbamol (ROBAXIN) tablet 500 mg  500 mg Oral Q8H PRN Ivor Costa, MD      . multivitamin with minerals tablet 1 tablet  1 tablet Oral Daily Ivor Costa, MD   1 tablet at 12/28/16 1001  . omega-3 acid ethyl esters (LOVAZA) capsule 1 g  1 g Oral BID Ivor Costa, MD   1 g at 12/28/16 1001  . ondansetron (ZOFRAN) tablet 4 mg  4 mg Oral Q6H PRN Ivor Costa, MD       Or  . ondansetron [facility]) injection 4 mg  4 mg Intravenous Q6H PRN Ivor Costa, MD      . oxyCODONE-acetaminophen (PERCOCET/ROXICET) 5-325 MG per tablet 1 tablet  1 tablet Oral Q4H PRN Ivor Costa, MD   1 tablet at 12/28/16 0656  . senna-docusate (Senokot-S) tablet 1 tablet  1 tablet Oral QHS PRN Ivor Costa, MD      . sevelamer carbonate (RENVELA) tablet 800 mg  800 mg Oral TID WC Ivor Costa, MD   800 mg at 12/28/16 1001  . vancomycin (VANCOCIN) IVPB 1000 mg/200 mL premix  1,000 mg Intravenous Q T,Th,Sa-HD Ivor Costa, MD      . vitamin C (ASCORBIC ACID) tablet 500 mg  500 mg Oral BID Ivor Costa, MD   500 mg at 12/28/16 1001  . zolpidem (AMBIEN) tablet 5 mg  5 mg Oral QHS PRN Ivor Costa, MD        ROS: As per HPI otherwise negative.  Physical Exam: Vitals:   12/27/16 2001 12/28/16 0315 12/28/16 0610 12/28/16 0948  BP: (!) 115/57 105/72 118/66 128/75  Pulse: 86 79 82 78  Resp: 18 18 17 18   Temp:   98.1 F (36.7 C) 98 F (36.7 C)  TempSrc:   Oral Oral  SpO2: 99% 99% 94% 100%   Weight:   120.2 kg (265 lb 1.6 oz)   Height:   5' 11.5" (1.816 m)      General: WDWN male NAD  Head: NCAT sclera not icteric MMM Neck: Supple. No JVD No masses Lungs: CTA bilaterally without wheezes, rales, or rhonchi. Breathing is unlabored. Heart: RRR with S1 S2 Abdomen: soft NT + BS Lower extremities: L BKA, chronic venous changes, trace edema superficial ulcer RLE, brown drainage  Neuro: A & O  X 3. Moves all extremities spontaneously. Psych:  Responds to questions appropriately with a normal affect. Dialysis Access: RUE AVF +bruit   Labs: Basic Metabolic Panel:  Recent Labs Lab 12/27/16 1600  NA 134*  K 5.4*  CL 100*  CO2 22  GLUCOSE 128*  BUN 74*  CREATININE 7.06*  CALCIUM 8.1*   Liver Function Tests:  Recent Labs Lab 12/27/16 1600  AST 9*  ALT 9*  ALKPHOS 111  BILITOT 0.8  PROT 6.4*  ALBUMIN 2.5*   No results for input(s): LIPASE, AMYLASE in the last 168 hours. No results for input(s): AMMONIA in the last 168 hours. CBC:  Recent Labs Lab 12/27/16 1600  WBC 6.9  NEUTROABS 5.6  HGB 8.6*  HCT 28.8*  MCV 96.3  PLT 136*   Cardiac Enzymes: No results for input(s): CKTOTAL, CKMB, CKMBINDEX, TROPONINI in the last 168 hours. CBG:  Recent Labs Lab 12/27/16 1257 12/28/16 0245 12/28/16 0745 12/28/16 1207  GLUCAP 140* 168* 123* 125*   Iron Studies: No results for input(s): IRON, TIBC, TRANSFERRIN, FERRITIN in the last 72 hours. Studies/Results: Ct Lumbar Spine Wo Contrast  Result Date: 12/27/2016 CLINICAL DATA:  Status post fall July 2018. Severe low back pain for 3 days. EXAM: CT LUMBAR SPINE WITHOUT CONTRAST TECHNIQUE: Multidetector CT imaging of the lumbar spine was performed without intravenous contrast administration. Multiplanar CT image reconstructions were also generated. COMPARISON:  None. FINDINGS: Segmentation: 5 lumbar type vertebrae. Alignment: Normal. Vertebrae: Severe osteopenia. Severe L5 vertebral body compression fracture with  approximately 80% anterior height loss unchanged compared with 01/13/2016. Remainder the vertebral body heights are maintained. No aggressive osseous lesion. Bone destruction. Paraspinal and other soft tissues: No acute paraspinal abnormality. Bilateral atrophic kidneys with multiple cysts. Abdominal aortic atherosclerosis. Disc levels: Disc heights are maintained. At L1-2: Mild broad-based disc bulge. Mild bilateral facet arthropathy. No foraminal stenosis. At L2-3: Mild broad-based disc bulge. Mild bilateral facet arthropathy. No foraminal stenosis. At L3-4: Mild broad-based disc bulge. Mild bilateral facet arthropathy. Mild right foraminal stenosis. No left foraminal stenosis. At L4-5: Mild broad-based disc bulge. Moderate bilateral facet arthropathy, right worse than left. Mild right foraminal stenosis. No left foraminal stenosis. Central canal stenosis. At L5-S1: Broad-based disc osteophyte complex. Mild bilateral facet arthropathy. Bilateral foraminal stenosis. IMPRESSION: 1.  No acute osseous injury of the lumbar spine. 2. Chronic L5 vertebral body compression fracture. 3. Lumbar spine spondylosis as described above. Electronically Signed   By: Kathreen Devoid   On: 12/27/2016 16:30    Dialysis Orders:  [facility] TTS 4.25h F200 2K/2.5Ca 450/800  EDW 117.5kg  RU AVF No heparin  Mircera 200 q 2 wks last 10/25 No VDRA   Assessment/Plan: 1. Low back pain- Chronic L5 compression fxs, spondylosis on L-spine CT - pain resolved this am - per primary  2. UTI s/p urostomy UA positive, cultures pending, IV Rocephin started - per primary  3. ESRD -  TTS. HD today on schedule  4. Hypertension/volume  - BP stable/on midodrine for hypotension/Plan UF to EDW as tolerated  5. Anemia  - Hgb 8.6, Follow trend. On OP ESA  6. Metabolic bone disease -  Cont Renvela binder  7. Nutrition - Renal diet/vitamins  8. DM - per primary   Lynnda Child PA-C [place] Pager  [phone] 12/28/2016, 12:15 PM   Pt seen, examined and agree w A/P as above.  Kelly Splinter MD [employer] pager [phone]   12/28/2016, 3:57 PM

## 2016-12-28 NOTE — Progress Notes (Signed)
PT Cancellation Note  Patient Details Name: [name] MRN: [id number] DOB: February 11, 1956   Cancelled Treatment:    Reason Eval/Treat Not Completed: Patient declined, no reason specified. Will check back as schedule allows to complete PT eval.    Thelma Comp 12/28/2016, 9:04 AM   Rolinda Roan, PT, DPT Acute Rehabilitation Services Pager: [phone]

## 2016-12-28 NOTE — Progress Notes (Signed)
Patient was in his bed asleep. Patient later woke up but was too drowsy and week to make any decision about AD. Chaplain will revisit at a later time .  Chaplain Meria Crilly/

## 2016-12-28 NOTE — Progress Notes (Signed)
PROGRESS NOTE    [name]  LYY:[id number] DOB: 02-27-1956 DOA: 12/27/2016 PCP: Patient, No Pcp Per  Outpatient Specialists:     Brief Narrative:  Patient is a 61 y.o. male with past medical history significant for hypertension, hyperlipidemia, diabetes mellitus, hypothyroidism, anxiety, OSA not on CPAP, morbid obesity, PVD, DVT, BPH, iron deficiency anemia, ESRD-HD (TTS), s/p of urostomy, s/p of left BKA, who presents with lower back pain, foul smelling in urine, right leg wound and draining.  Assessment & Plan:   Principal Problem:   Lower back pain Active Problems:   Type 2 diabetes mellitus with diabetic nephropathy, with long-term current use of insulin (HCC)   Essential hypertension   Hyperkalemia   Hypothyroidism, acquired   Cellulitis of left leg   Hyperlipidemia   ESRD on dialysis [facility])   UTI (urinary tract infection)   Lower back pain: CT-L spin showed chronic L5 vertebral body compression fracture and Lumbar spine spondylosis. Pt has severe back pain. He seems not be able to take care himself at home and my need rehab at [facility]. - Pain is better controlled -prn percocet and Robaxin -continue Neurontin PT/OT -CM and SW consult  Possible UTI (urinary tract infection): s/p of urostomy. UA is positive. Pt had positive urine culture for E Coli which were resistant to multiple Abx on 08/03/14 - start Rocephin and vanco bu IV -f/u Bx and Ux.  Possible cellulitis of left leg: No fever or leukocytosis. Not septic. Lactic acid is normal. -On recephin and vancomycin -f/u Bx and Ux -wound care consult -check Crp and ESR -get LE doppler to r/o DVT  Type 2 diabetes mellitus with diabetic nephropathy, with long-term current use of insulin ([place]): Last A1c 5.3, well controled. Patient is taking glargin and Novolog at home -will decrease glargin dose from 40 to 28 units daily -SSI  Essential hypertension: bp 124/54. Not taking Bp meds at home -IV hydralazine when  necessary  ESRD on dialysis (TTS): missed Tuesday HD. potassium 5.4, bicarbonate 22, creatinine 7.06, BUN 24. Pt does not have respiratory distress, I don't think patient needs urgent dialysis tonight. -Left message to renal box for HD -Continue Renvela  Hyperkalemia: mild, K=5.4 - Should resolve with HD.  Hypothyroidism: Last TSH was 5.94 on 01/21/14 -Continue home Synthroid  HLD -Lipitor  DVT ppx: SQ Heparin Code Status: Full code Family Communication: None at bed side.   Disposition Plan:  Anticipate discharge back to previous home environment Consults called:  None     Consultants:   Nephrology  Antimicrobials:   Vancomycin  Rocephin   Subjective: No new complaints  Objective: Vitals:   12/28/16 0948 12/28/16 1723 12/28/16 1737 12/28/16 1742  BP: 128/75 114/68 105/60 (!) 101/56  Pulse: 78 77 75 71  Resp: '18 18 18 18  ' Temp: 98 F (36.7 C) 98 F (36.7 C)    TempSrc: Oral Oral    SpO2: 100% 100%    Weight:  121.5 kg (267 lb 13.7 oz)    Height:        Intake/Output Summary (Last 24 hours) at 12/28/16 1822 Last data filed at 12/28/16 1458  Gross per 24 hour  Intake              480 ml  Output                0 ml  Net              480 ml   [employer]  12/27/16 1257 12/28/16 0610 12/28/16 1723  Weight: 124.7 kg (275 lb) 120.2 kg (265 lb 1.6 oz) 121.5 kg (267 lb 13.7 oz)    Examination:  General exam: Morbidly obese. Not in any distress.  Respiratory system: Clear to auscultation.  Cardiovascular system: S1 & S2   Gastrointestinal system: Abdomen is morbidly obese, and nontender. Urostomy bad on right side of abdomen with milky urine. Central nervous system: Alert and oriented. No focal neurological deficits. Extremities: Left BKA with skin changes around the stump. Skin changes RLE.   Data Reviewed: I have personally reviewed following labs and imaging studies  CBC:  Recent Labs Lab 12/27/16 1600  WBC 6.9  NEUTROABS 5.6  HGB 8.6*   HCT 28.8*  MCV 96.3  PLT 502*   Basic Metabolic Panel:  Recent Labs Lab 12/27/16 1600  NA 134*  K 5.4*  CL 100*  CO2 22  GLUCOSE 128*  BUN 74*  CREATININE 7.06*  CALCIUM 8.1*   GFR: Estimated Creatinine Clearance: 15.1 mL/min (A) (by C-G formula based on SCr of 7.06 mg/dL (H)). Liver Function Tests:  Recent Labs Lab 12/27/16 1600  AST 9*  ALT 9*  ALKPHOS 111  BILITOT 0.8  PROT 6.4*  ALBUMIN 2.5*   No results for input(s): LIPASE, AMYLASE in the last 168 hours. No results for input(s): AMMONIA in the last 168 hours. Coagulation Profile: No results for input(s): INR, PROTIME in the last 168 hours. Cardiac Enzymes: No results for input(s): CKTOTAL, CKMB, CKMBINDEX, TROPONINI in the last 168 hours. BNP (last 3 results) No results for input(s): PROBNP in the last 8760 hours. HbA1C: No results for input(s): HGBA1C in the last 72 hours. CBG:  Recent Labs Lab 12/27/16 1257 12/28/16 0245 12/28/16 0745 12/28/16 1207  GLUCAP 140* 168* 123* 125*   Lipid Profile: No results for input(s): CHOL, HDL, LDLCALC, TRIG, CHOLHDL, LDLDIRECT in the last 72 hours. Thyroid Function Tests: No results for input(s): TSH, T4TOTAL, FREET4, T3FREE, THYROIDAB in the last 72 hours. Anemia Panel: No results for input(s): VITAMINB12, FOLATE, FERRITIN, TIBC, IRON, RETICCTPCT in the last 72 hours. Urine analysis:    Component Value Date/Time   COLORURINE AMBER (A) 12/27/2016 1601   APPEARANCEUR TURBID (A) 12/27/2016 1601   LABSPEC >1.046 (H) 12/27/2016 1601   PHURINE 5.0 12/27/2016 1601   GLUCOSEU NEGATIVE 12/27/2016 1601   HGBUR NEGATIVE 12/27/2016 1601   BILIRUBINUR NEGATIVE 12/27/2016 1601   BILIRUBINUR neg 08/10/2014 1637   KETONESUR NEGATIVE 12/27/2016 1601   PROTEINUR NEGATIVE 12/27/2016 1601   UROBILINOGEN 1.0 08/10/2014 1637   UROBILINOGEN 0.2 08/03/2014 1225   NITRITE NEGATIVE 12/27/2016 1601   LEUKOCYTESUR TRACE (A) 12/27/2016 1601   Sepsis  Labs: '@LABRCNTIP' (procalcitonin:4,lacticidven:4)  ) Recent Results (from the past 240 hour(s))  Urine culture     Status: Abnormal (Preliminary result)   Collection Time: 12/27/16  4:01 PM  Result Value Ref Range Status   Specimen Description URINE, RANDOM  Final   Special Requests NONE  Final   Culture >=100,000 COLONIES/mL GRAM NEGATIVE RODS (A)  Final   Report Status PENDING  Incomplete  MRSA PCR Screening     Status: Abnormal   Collection Time: 12/28/16  7:04 AM  Result Value Ref Range Status   MRSA by PCR POSITIVE (A) NEGATIVE Final    Comment:        The GeneXpert MRSA Assay (FDA approved for NASAL specimens only), is one component of a comprehensive MRSA colonization surveillance program. It is not intended to diagnose MRSA  infection nor to guide or monitor treatment for MRSA infections. RESULT CALLED TO, READ BACK BY AND VERIFIED WITH: Gordy Clement RN 9:45 12/28/16 ([email or web address])          Radiology Studies: Ct Lumbar Spine Wo Contrast  Result Date: 12/27/2016 CLINICAL DATA:  Status post fall July 2018. Severe low back pain for 3 days. EXAM: CT LUMBAR SPINE WITHOUT CONTRAST TECHNIQUE: Multidetector CT imaging of the lumbar spine was performed without intravenous contrast administration. Multiplanar CT image reconstructions were also generated. COMPARISON:  None. FINDINGS: Segmentation: 5 lumbar type vertebrae. Alignment: Normal. Vertebrae: Severe osteopenia. Severe L5 vertebral body compression fracture with approximately 80% anterior height loss unchanged compared with 01/13/2016. Remainder the vertebral body heights are maintained. No aggressive osseous lesion. Bone destruction. Paraspinal and other soft tissues: No acute paraspinal abnormality. Bilateral atrophic kidneys with multiple cysts. Abdominal aortic atherosclerosis. Disc levels: Disc heights are maintained. At L1-2: Mild broad-based disc bulge. Mild bilateral facet arthropathy. No foraminal stenosis. At L2-3: Mild  broad-based disc bulge. Mild bilateral facet arthropathy. No foraminal stenosis. At L3-4: Mild broad-based disc bulge. Mild bilateral facet arthropathy. Mild right foraminal stenosis. No left foraminal stenosis. At L4-5: Mild broad-based disc bulge. Moderate bilateral facet arthropathy, right worse than left. Mild right foraminal stenosis. No left foraminal stenosis. Central canal stenosis. At L5-S1: Broad-based disc osteophyte complex. Mild bilateral facet arthropathy. Bilateral foraminal stenosis. IMPRESSION: 1.  No acute osseous injury of the lumbar spine. 2. Chronic L5 vertebral body compression fracture. 3. Lumbar spine spondylosis as described above. Electronically Signed   By: Kathreen Devoid   On: 12/27/2016 16:30        Scheduled Meds: . atorvastatin  40 mg Oral q1800  . [START ON 12/29/2016] Chlorhexidine Gluconate Cloth  6 each Topical Q0600  . ferrous sulfate  325 mg Oral BID  . heparin  5,000 Units Subcutaneous Q8H  . insulin aspart  0-9 Units Subcutaneous TID WC  . insulin glargine  28 Units Subcutaneous QHS  . levothyroxine  225 mcg Oral QAC breakfast  . methenamine  1 g Oral BID  . midodrine  10 mg Oral Daily  . multivitamin with minerals  1 tablet Oral Daily  . mupirocin ointment  1 application Nasal BID  . omega-3 acid ethyl esters  1 g Oral BID  . sevelamer carbonate  800 mg Oral TID WC  . vitamin C  500 mg Oral BID   Continuous Infusions: . sodium chloride    . sodium chloride    . cefTRIAXone (ROCEPHIN)  IV    . vancomycin       LOS: 1 day    Time spent: 68 Minutes    Dana Allan, MD  [facility] Pager #: [phone] 7PM-7AM contact night coverage as above

## 2016-12-28 NOTE — Evaluation (Signed)
Physical Therapy Evaluation Patient Details Name: [name] MRN: [id number] DOB: 02-02-1956 Today's Date: 12/28/2016   History of Present Illness  61 y.o. male with medical history significant of hypertension, hyperlipidemia, diabetes mellitus, hypothyroidism, anxiety, OSA not on CPAP, morbid obesity, PVD, DVT, BPH, iron deficiency anemia, ESRD-HD (TTS), s/p of urostomy, s/p of left BKA, who presents with lower back pain, foul smelling in urine, right leg wound and draining.  Clinical Impression  Pt admitted with above diagnosis. Pt currently with functional limitations due to the deficits listed below (see PT Problem List). At the time of PT eval pt was able to perform transfers with +2 max assist for all aspects of transitioning to/from EOB. Pt from SNF (returned home 10/29), and reports that he used a slide board to transfer into the car, and once he got home, he was not able to fully transfer out of the car. Apparently neighbors saw the patient and his family struggling to get him out of the car and came over to help, which is where pt states his R shoulder got injured. Shoulder pain was the main complaint throughout session. I do not feel that this patient is safe to return home at this time, and PT recommends further rehab at the SNF level. If pt declines, will need an ambulance transfer home, [facility] lift, and proper pressure distribution cushion for his wheelchair. Acutely, pt will benefit from skilled PT to increase their independence and safety with mobility to allow discharge to the venue listed below.       Follow Up Recommendations SNF;Supervision/Assistance - 24 hour    Equipment Recommendations   Product manager Lift if patient returns home)    Recommendations for Other Services       Precautions / Restrictions Precautions Precautions: Fall;Other (comment) (BLE wounds; urostomy bag) Restrictions Other Position/Activity Restrictions: none noted in chart      Mobility  Bed  Mobility Overal bed mobility: Needs Assistance Bed Mobility: Supine to Sit;Sit to Supine     Supine to sit: Max assist;+2 for physical assistance;HOB elevated Sit to supine: Max assist;+2 for physical assistance;HOB elevated   General bed mobility comments: Significant +2 assist required for all aspects of bed mobility. Once sitting EOB pt was able to gain/maintain sitting balance without assistance, however did continue to use UE support.   Transfers                 General transfer comment: unable to complete. this patient is appropriate for transfers OOB with [facility] with nursing staff.  Ambulation/Gait                Stairs            Wheelchair Mobility    Modified Rankin (Stroke Patients Only)       Balance Overall balance assessment: Needs assistance Sitting-balance support: Single extremity supported;Feet unsupported Sitting balance-Leahy Scale: Fair                                       Pertinent Vitals/Pain Pain Assessment: Faces Faces Pain Scale: Hurts even more Pain Location: R shoulder with movement Pain Descriptors / Indicators: Aching;Discomfort;Sore Pain Intervention(s): Limited activity within patient's tolerance;Monitored during session    [place] expects to be discharged to:: Unsure                 Additional Comments: recently Discharged from SNF on Monday  10/29 per pt; states he had "just started doing slide board transfers; assistance needed for self care    Prior Function           Comments: receiving assistnace from SNF; pt reports he was able to complete slide board transfer but question amount of assistance needed; When asked about self care and managing pericare, he states " I hadn't had to do that yet - I was only home 2 days"     Hand Dominance   Dominant Hand: Right    Extremity/Trunk Assessment   Upper Extremity Assessment Upper Extremity Assessment: Defer to OT  evaluation RUE Deficits / Details: unable to achieve full extension; "claw hand" appearance B hands; poor inhand manipulation skills B; able to use hands to feed self but difficulty with holding utnesils; c/o R shoulder pain consistent with impingement (improves when scapula is depressed); strength overall 3+/5 throughout as tolerated RUE Coordination: decreased fine motor;decreased gross motor LUE Deficits / Details: stronger than R and does not complain of L shoulder pain; "; decreaed fine motor skills at baseline "claw hand"" LUE Coordination: decreased fine motor    Lower Extremity Assessment Lower Extremity Assessment: RLE deficits/detail;LLE deficits/detail RLE Deficits / Details: Charcot appearing foot with significant discoloration of the R lower leg. Noted a pressure injury to the R heel (draining) LLE Deficits / Details: Prior BKA. Noted open wound to the residual limb (draining).     Cervical / Trunk Assessment Cervical / Trunk Assessment: Kyphotic  Communication   Communication: No difficulties  Cognition Arousal/Alertness: Awake/alert Behavior During Therapy: Anxious Overall Cognitive Status: No family/caregiver present to determine baseline cognitive functioning                                 General Comments: impaired judgement; problem solving; awareness of deficits      General Comments      Exercises    Assessment/Plan    PT Assessment Patient needs continued PT services  PT Problem List Decreased strength;Decreased range of motion;Decreased activity tolerance;Decreased balance;Decreased mobility;Decreased knowledge of use of DME;Decreased safety awareness;Decreased knowledge of precautions;Pain       PT Treatment Interventions DME instruction;Gait training;Stair training;Functional mobility training;Therapeutic activities;Therapeutic exercise;Neuromuscular re-education;Patient/family education    PT Goals (Current goals can be found in the  Care Plan section)  Acute Rehab PT Goals Patient Stated Goal: to get stronger PT Goal Formulation: With patient Time For Goal Achievement: 01/11/17 Potential to Achieve Goals: Fair    Frequency Min 2X/week   Barriers to discharge Decreased caregiver support Unsure if pt's family will be able to accommodate the increased level of assistance required at this time.     Co-evaluation PT/OT/SLP Co-Evaluation/Treatment: Yes Reason for Co-Treatment: Complexity of the patient's impairments (multi-system involvement);For patient/therapist safety;To address functional/ADL transfers PT goals addressed during session: Mobility/safety with mobility;Balance OT goals addressed during session: ADL's and self-care       AM-PAC PT "6 Clicks" Daily Activity  Outcome Measure Difficulty turning over in bed (including adjusting bedclothes, sheets and blankets)?: Unable Difficulty moving from lying on back to sitting on the side of the bed? : Unable Difficulty sitting down on and standing up from a chair with arms (e.g., wheelchair, bedside commode, etc,.)?: Unable Help needed moving to and from a bed to chair (including a wheelchair)?: Total Help needed walking in hospital room?: Total Help needed climbing 3-5 steps with a railing? : Total 6 Click Score: 6  End of Session   Activity Tolerance: Patient limited by fatigue;Patient limited by pain Patient left: in bed;with call bell/phone within reach (OT present) Nurse Communication: Mobility status;Need for lift equipment PT Visit Diagnosis: Pain;Muscle weakness (generalized) (M62.81);Difficulty in walking, not elsewhere classified (R26.2) Pain - Right/Left: Right Pain - part of body: Shoulder    Time: 1206-1228 PT Time Calculation (min) (ACUTE ONLY): 22 min   Charges:   PT Evaluation $PT Eval High Complexity: 1 High     PT G Codes:        Rolinda Roan, PT, DPT Acute Rehabilitation Services Pager: [phone]   Thelma Comp 12/28/2016, 2:00 PM

## 2016-12-28 NOTE — Telephone Encounter (Signed)
I left a voicemail for patients wife. I'm unsure how to transfer him to another rehab facility. If they feel like it is safe for him to go home, social worker would set him up for home health care at time of discharge.

## 2016-12-28 NOTE — Consult Note (Addendum)
[place] Nurse wound consult note Reason for Consult: multiple wounds Patient from SNF, has BKA of the left LE.  Vascular changes in the RLE, with weak pulses.+hemosiderin staining. Reports the stump wound is followed by Dr. Sharol Given and that he saw him in his office last week.    Wound type: Full thickness ulceration on the posterior side of the stump, unclear if this is related to pressure from bed or stump shrinker? He reports he has not worn prothesis in some time  Trauma wound of the right pretibial region, it is 100% black, however with his history I would leave this area intact    Pressure Injury POA: Yes Measurement: Left stump: 3cm x 3cm x 0.2.  I was unable to probe wound today. Right pretibial: 3cm x 0.7cm x 0cm  Wound bed: left stump: ruddy red, small area of black tissue present; right pretibial 100% black  Drainage (amount, consistency, odor) none from the RLE, however the left stump wound does have an odor and I am unable to get a clear picture from the patient on if this is new.  There is not a dressing in place at the time of my assessment but the bedside nurse took a dressing off from the SNF and reports green drainage also with an odor Periwound: vascular skin changes on the RLE Dressing procedure/placement/frequency: Foam to the right pretibial wound.   I have contacted Dr. Sharol Given to see if he recalled that this wound has drainage with odor, he will evaluate the patient. For that reason I will not enter wound care orders at this time.   [place] Nurse ostomy consult note Stoma type/location: RLQ, ileal conduit Stomal assessment/size: 1: round, visualized through the pouch Peristomal assessment:  Not performed today Treatment options for stomal/peristomal skin: NA Output thick, milky green urine, patient reports his urine has been like that for a long time and it's because he has "yeast in it" Ostomy pouching: 1pc.     [place], [place], [place]

## 2016-12-28 NOTE — Progress Notes (Signed)
Pt admitted to [facility] from ER. On arrival pt is alert and oriented x4. VSS. Pt oriented to unit and equipment. White identification bracelet in place. Call light within reach. WCTM

## 2016-12-29 ENCOUNTER — Encounter (HOSPITAL_COMMUNITY): Payer: Self-pay

## 2016-12-29 ENCOUNTER — Inpatient Hospital Stay (HOSPITAL_COMMUNITY): Payer: Medicare Other

## 2016-12-29 DIAGNOSIS — E039 Hypothyroidism, unspecified: Secondary | ICD-10-CM

## 2016-12-29 DIAGNOSIS — M7989 Other specified soft tissue disorders: Secondary | ICD-10-CM

## 2016-12-29 DIAGNOSIS — I87321 Chronic venous hypertension (idiopathic) with inflammation of right lower extremity: Secondary | ICD-10-CM

## 2016-12-29 LAB — URINE CULTURE

## 2016-12-29 LAB — GLUCOSE, CAPILLARY
GLUCOSE-CAPILLARY: 193 mg/dL — AB (ref 65–99)
GLUCOSE-CAPILLARY: 145 mg/dL — AB (ref 65–99)
GLUCOSE-CAPILLARY: 141 mg/dL — AB (ref 65–99)
Glucose-Capillary: 155 mg/dL — ABNORMAL HIGH (ref 65–99)

## 2016-12-29 LAB — HEPATITIS B SURFACE ANTIGEN: HEP B S AG: NEGATIVE

## 2016-12-29 LAB — HEPATITIS B SURFACE ANTIBODY,QUALITATIVE: HEP B S AB: REACTIVE

## 2016-12-29 MED ORDER — PRO-STAT SUGAR FREE PO LIQD
30.0000 mL | Freq: Two times a day (BID) | ORAL | Status: DC
  Administered 2016-12-29 – 2017-01-05 (×10): 30 mL via ORAL
  Filled 2016-12-29 (×14): qty 30

## 2016-12-29 NOTE — Progress Notes (Signed)
PROGRESS NOTE  [name] QBH:[id number] DOB: May 21, 1955 DOA: 12/27/2016 PCP: Patient, No Pcp Per  HPI/Recap of past 79 hours: 61 year old male with a medical history significant for hypertension, hyperlipidemia, diabetes, hypothyroidism, anxiety, morbid obesity, peripheral vascular disease, ESRD, urostomy, left BKA presents with lower back pain, foul-smelling urine, right leg wound with draining  Today patient reports feeling much better, denies worsening back pain, denies chest pain, abdominal pain, nausea/vomiting, diarrhea, fever/chills.  Assessment/Plan: Principal Problem:   Lower back pain Active Problems:   Type 2 diabetes mellitus with diabetic nephropathy, with long-term current use of insulin (HCC)   Essential hypertension   Hyperkalemia   Hypothyroidism, acquired   Cellulitis of left leg   Hyperlipidemia   ESRD on dialysis [facility])   UTI (urinary tract infection)   Idiopathic chronic venous hypertension of right lower extremity with inflammation  #Chronic low back pain Improved CT lumbar spine showed chronic L5 vertebral body compression fracture Pain management PT/OT Will need placements, case manager and social worker on board  #Complicated UTI Status post urostomy, patient is most likely colonized UA positive Continue Rocephin and vancomycin IV Blood culture urine culture pending  #Cellulitis of left BKA stump Afebrile, no leukocytosis CRP elevated Lower extremity Doppler to rule out DVT pending Will consider MRI, to rule out osteomyelitis Wound care consult  #Right leg lymphedema  Ortho on consult, recommended Unna wrap  #Type 2 diabetes mellitus Continue insulin regimen, SSI  #Hypertension Not taking any BP meds Monitor  #ESRD Nephrology on board dialysis, T/T/S  #Hypothyroidism Continue home Synthroid     Code Status: Full  Family Communication: None at bedside  Disposition Plan: Possible  SNF   Consultants:  Nephrology  Orthopedics  Procedures:  None  Antimicrobials:  Vancomycin  Rocephin  DVT prophylaxis: Heparin   Objective: Vitals:   12/29/16 0534 12/29/16 1000 12/29/16 1754 12/29/16 2103  BP: (!) 103/59 105/62 (!) 99/58 124/64  Pulse: 73 74 68 74  Resp: 19 18 18 18   Temp: 98.6 F (37 C) 98.2 F (36.8 C) 98 F (36.7 C) 97.8 F (36.6 C)  TempSrc: Oral Oral Oral Oral  SpO2: 98% 98% 96% 97%  Weight:      Height:        Intake/Output Summary (Last 24 hours) at 12/29/16 2256 Last data filed at 12/29/16 2229  Gross per 24 hour  Intake              850 ml  Output                0 ml  Net              850 ml   Filed Weights   12/28/16 0610 12/28/16 1723 12/28/16 2037  Weight: 120.2 kg (265 lb 1.6 oz) 121.5 kg (267 lb 13.7 oz) 120.3 kg (265 lb 3.4 oz)    Exam:   General: Alert, awake, oriented  Cardiovascular: S1-S2 present no added heart sounds  Respiratory: Clear to auscultation bilaterally  Abdomen: Obese, soft, nontender.  Urostomy bag on the right side of abdomen with frothy urine  Musculoskeletal: Left BKA with stump ulcer and drainage.  Right leg lymphedema  Skin: Normal  Psychiatry: Normal mood   Data Reviewed: CBC:  Recent Labs Lab 12/27/16 1600  WBC 6.9  NEUTROABS 5.6  HGB 8.6*  HCT 28.8*  MCV 96.3  PLT 097*   Basic Metabolic Panel:  Recent Labs Lab 12/27/16 1600  NA 134*  K 5.4*  CL  100*  CO2 22  GLUCOSE 128*  BUN 74*  CREATININE 7.06*  CALCIUM 8.1*   GFR: Estimated Creatinine Clearance: 15 mL/min (A) (by C-G formula based on SCr of 7.06 mg/dL (H)). Liver Function Tests:  Recent Labs Lab 12/27/16 1600  AST 9*  ALT 9*  ALKPHOS 111  BILITOT 0.8  PROT 6.4*  ALBUMIN 2.5*   No results for input(s): LIPASE, AMYLASE in the last 168 hours. No results for input(s): AMMONIA in the last 168 hours. Coagulation Profile: No results for input(s): INR, PROTIME in the last 168 hours. Cardiac  Enzymes: No results for input(s): CKTOTAL, CKMB, CKMBINDEX, TROPONINI in the last 168 hours. BNP (last 3 results) No results for input(s): PROBNP in the last 8760 hours. HbA1C: No results for input(s): HGBA1C in the last 72 hours. CBG:  Recent Labs Lab 12/28/16 2142 12/29/16 0748 12/29/16 1157 12/29/16 1658 12/29/16 2100  GLUCAP 160* 155* 193* 145* 141*   Lipid Profile: No results for input(s): CHOL, HDL, LDLCALC, TRIG, CHOLHDL, LDLDIRECT in the last 72 hours. Thyroid Function Tests: No results for input(s): TSH, T4TOTAL, FREET4, T3FREE, THYROIDAB in the last 72 hours. Anemia Panel: No results for input(s): VITAMINB12, FOLATE, FERRITIN, TIBC, IRON, RETICCTPCT in the last 72 hours. Urine analysis:    Component Value Date/Time   COLORURINE AMBER (A) 12/27/2016 1601   APPEARANCEUR TURBID (A) 12/27/2016 1601   LABSPEC >1.046 (H) 12/27/2016 1601   PHURINE 5.0 12/27/2016 1601   GLUCOSEU NEGATIVE 12/27/2016 1601   HGBUR NEGATIVE 12/27/2016 1601   BILIRUBINUR NEGATIVE 12/27/2016 1601   BILIRUBINUR neg 08/10/2014 1637   KETONESUR NEGATIVE 12/27/2016 1601   PROTEINUR NEGATIVE 12/27/2016 1601   UROBILINOGEN 1.0 08/10/2014 1637   UROBILINOGEN 0.2 08/03/2014 1225   NITRITE NEGATIVE 12/27/2016 1601   LEUKOCYTESUR TRACE (A) 12/27/2016 1601   Sepsis Labs: @LABRCNTIP (procalcitonin:4,lacticidven:4)  ) Recent Results (from the past 240 hour(s))  Urine culture     Status: Abnormal   Collection Time: 12/27/16  4:01 PM  Result Value Ref Range Status   Specimen Description URINE, RANDOM  Final   Special Requests NONE  Final   Culture >=100,000 COLONIES/mL ESCHERICHIA COLI (A)  Final   Report Status 12/29/2016 FINAL  Final   Organism ID, Bacteria ESCHERICHIA COLI (A)  Final      Susceptibility   Escherichia coli - MIC*    AMPICILLIN >=32 RESISTANT Resistant     CEFAZOLIN <=4 SENSITIVE Sensitive     CEFTRIAXONE <=1 SENSITIVE Sensitive     CIPROFLOXACIN >=4 RESISTANT Resistant      GENTAMICIN >=16 RESISTANT Resistant     IMIPENEM <=0.25 SENSITIVE Sensitive     NITROFURANTOIN <=16 SENSITIVE Sensitive     TRIMETH/SULFA >=320 RESISTANT Resistant     AMPICILLIN/SULBACTAM >=32 RESISTANT Resistant     PIP/TAZO <=4 SENSITIVE Sensitive     Extended ESBL NEGATIVE Sensitive     * >=100,000 COLONIES/mL ESCHERICHIA COLI  Culture, blood (Routine X 2) w Reflex to ID Panel     Status: None (Preliminary result)   Collection Time: 12/28/16  3:05 AM  Result Value Ref Range Status   Specimen Description BLOOD LEFT ARM  Final   Special Requests IN PEDIATRIC BOTTLE Blood Culture adequate volume  Final   Culture NO GROWTH 1 DAY  Final   Report Status PENDING  Incomplete  MRSA PCR Screening     Status: Abnormal   Collection Time: 12/28/16  7:04 AM  Result Value Ref Range Status   MRSA by PCR POSITIVE (  A) NEGATIVE Final    Comment:        The GeneXpert MRSA Assay (FDA approved for NASAL specimens only), is one component of a comprehensive MRSA colonization surveillance program. It is not intended to diagnose MRSA infection nor to guide or monitor treatment for MRSA infections. RESULT CALLED TO, READ BACK BY AND VERIFIED WITH: Gordy Clement RN 9:45 12/28/16 ([email or web address])       Studies: No results found.  Scheduled Meds: . atorvastatin  40 mg Oral q1800  . Chlorhexidine Gluconate Cloth  6 each Topical Q0600  . feeding supplement (PRO-STAT SUGAR FREE 64)  30 mL Oral BID  . ferrous sulfate  325 mg Oral BID  . heparin  5,000 Units Subcutaneous Q8H  . insulin aspart  0-9 Units Subcutaneous TID WC  . insulin glargine  28 Units Subcutaneous QHS  . levothyroxine  225 mcg Oral QAC breakfast  . methenamine  1 g Oral BID  . midodrine  10 mg Oral Daily  . multivitamin with minerals  1 tablet Oral Daily  . mupirocin ointment  1 application Nasal BID  . omega-3 acid ethyl esters  1 g Oral BID  . sevelamer carbonate  800 mg Oral TID WC  . vitamin C  500 mg Oral BID    Continuous  Infusions: . cefTRIAXone (ROCEPHIN)  IV Stopped (12/29/16 1909)  . vancomycin Stopped (12/28/16 2031)     LOS: 2 days     Alma Friendly, MD [facility]  If 7PM-7AM, please contact night-coverage [email or web address] Password [facility] 12/29/2016, 10:56 PM

## 2016-12-29 NOTE — Progress Notes (Signed)
[facility] Progress Note   Subjective:  Seen in room. Denies CP, dyspnea, stump or back pain at this time.  Objective Vitals:   12/28/16 2015 12/28/16 2037 12/28/16 2134 12/29/16 0534  BP: 106/63 (!) 105/58 (!) 95/56 (!) 103/59  Pulse: 79 69 81 73  Resp:  18 18 19   Temp:  97.9 F (36.6 C) 98.5 F (36.9 C) 98.6 F (37 C)  TempSrc:  Oral Oral Oral  SpO2:  97% 98% 98%  Weight:  120.3 kg (265 lb 3.4 oz)    Height:       Physical Exam General: Obese male, NAD. Heart: RRR; no murmur Lungs: CTA anteriorly Abdomen: soft, non-tender Extremities: RLE without edema/+hyperpigmentation/small wound, L stump with yellow drainage and odor present Dialysis Access: RUE AVF + bruit  Additional Objective Labs: Basic Metabolic Panel:  Recent Labs Lab 12/27/16 1600  NA 134*  K 5.4*  CL 100*  CO2 22  GLUCOSE 128*  BUN 74*  CREATININE 7.06*  CALCIUM 8.1*   Liver Function Tests:  Recent Labs Lab 12/27/16 1600  AST 9*  ALT 9*  ALKPHOS 111  BILITOT 0.8  PROT 6.4*  ALBUMIN 2.5*   CBC:  Recent Labs Lab 12/27/16 1600  WBC 6.9  NEUTROABS 5.6  HGB 8.6*  HCT 28.8*  MCV 96.3  PLT 136*   Blood Culture    Component Value Date/Time   SDES URINE, RANDOM 12/27/2016 1601   SPECREQUEST NONE 12/27/2016 1601   CULT >=100,000 COLONIES/mL GRAM NEGATIVE RODS (A) 12/27/2016 1601   REPTSTATUS PENDING 12/27/2016 1601   CBG:  Recent Labs Lab 12/28/16 0245 12/28/16 0745 12/28/16 1207 12/28/16 2142 12/29/16 0748  GLUCAP 168* 123* 125* 160* 155*   Studies/Results: Ct Lumbar Spine Wo Contrast  Result Date: 12/27/2016 CLINICAL DATA:  Status post fall July 2018. Severe low back pain for 3 days. EXAM: CT LUMBAR SPINE WITHOUT CONTRAST TECHNIQUE: Multidetector CT imaging of the lumbar spine was performed without intravenous contrast administration. Multiplanar CT image reconstructions were also generated. COMPARISON:  None. FINDINGS: Segmentation: 5 lumbar type  vertebrae. Alignment: Normal. Vertebrae: Severe osteopenia. Severe L5 vertebral body compression fracture with approximately 80% anterior height loss unchanged compared with 01/13/2016. Remainder the vertebral body heights are maintained. No aggressive osseous lesion. Bone destruction. Paraspinal and other soft tissues: No acute paraspinal abnormality. Bilateral atrophic kidneys with multiple cysts. Abdominal aortic atherosclerosis. Disc levels: Disc heights are maintained. At L1-2: Mild broad-based disc bulge. Mild bilateral facet arthropathy. No foraminal stenosis. At L2-3: Mild broad-based disc bulge. Mild bilateral facet arthropathy. No foraminal stenosis. At L3-4: Mild broad-based disc bulge. Mild bilateral facet arthropathy. Mild right foraminal stenosis. No left foraminal stenosis. At L4-5: Mild broad-based disc bulge. Moderate bilateral facet arthropathy, right worse than left. Mild right foraminal stenosis. No left foraminal stenosis. Central canal stenosis. At L5-S1: Broad-based disc osteophyte complex. Mild bilateral facet arthropathy. Bilateral foraminal stenosis. IMPRESSION: 1.  No acute osseous injury of the lumbar spine. 2. Chronic L5 vertebral body compression fracture. 3. Lumbar spine spondylosis as described above. Electronically Signed   By: Kathreen Devoid   On: 12/27/2016 16:30   Medications: . cefTRIAXone (ROCEPHIN)  IV Stopped (12/28/16 2252)  . vancomycin Stopped (12/28/16 2031)   . atorvastatin  40 mg Oral q1800  . Chlorhexidine Gluconate Cloth  6 each Topical Q0600  . ferrous sulfate  325 mg Oral BID  . heparin  5,000 Units Subcutaneous Q8H  . insulin aspart  0-9 Units Subcutaneous TID WC  .  insulin glargine  28 Units Subcutaneous QHS  . levothyroxine  225 mcg Oral QAC breakfast  . methenamine  1 g Oral BID  . midodrine  10 mg Oral Daily  . multivitamin with minerals  1 tablet Oral Daily  . mupirocin ointment  1 application Nasal BID  . omega-3 acid ethyl esters  1 g Oral BID   . sevelamer carbonate  800 mg Oral TID WC  . vitamin C  500 mg Oral BID    Dialysis Orders: [place] [facility] 4h 35min   2/2.5   117.5kg   RUE AVF   Hep none Mircera 200 q 2 wks (last 10/25) No VDRA   Assessment/Plan: 1. Low Back Pain: Chronic L5 compression fracture, pain improved. Per primary. 2. UTI (chronic urostomy):  UCx + GNR, on Ceftriaxone. 3. L stump wound: Drainage and odor present this morning, pt reports ortho eval this morning. Note pending, plan per ortho and wound care. Looks like Vanc has been added. 4. ESRD: Will continue HD per TTS schedule, next 11/3. No heparin. 5. HypoTN/volume: BP low side, on midodrine. UF as tolerated. 6. Anemia: Hgb 8.6, Not due for ESA yet. Follow. On PO iron BID. 7. Secondary hyperparathyroidism: Ca ok, Phos pending. Continue Renvela as binder. 8. Nutrition: Alb very low. Adding pro-stat BID. 9. DM: On insulin, per primary.  Veneta Penton, PA-C 12/29/2016, 9:17 AM  [facility] Pager: [phone]  Pt seen, examined and agree w A/P as above.  Kelly Splinter MD [employer] pager [phone]   12/29/2016, 12:43 PM

## 2016-12-29 NOTE — Progress Notes (Signed)
RLE venous duplex: no DVT. Unable to image calf due to unna boot.

## 2016-12-29 NOTE — Progress Notes (Signed)
Pharmacy Antibiotic Note  [name] is a 61 y.o. male admitted on 12/27/2016 with cellulitis.  Pharmacy has been consulted for vancomycin dosing.   Patient is ESRD on HD TTSat- went to HD yesterday and tolerated 3h @ BFR 451mL/min- vancomycin given afterwards appropriately.  CRP 10.4, LA 0.9.   Plan: Vancomycin 1000mg  IV qHD-TTSat Ceftriaxone 1g IV q24h per MD F/u clinical progression, c/s, de-escalation plan/LOT, HD schedule/tolerance, pre-HD level PRN  Recommend stopping patient's prophylactic methenamine as it is contraindicated in any degree of renal impairment   Height: 5' 11.5" (181.6 cm) Weight: 265 lb 3.4 oz (120.3 kg) IBW/kg (Calculated) : 76.45  Temp (24hrs), Avg:98.2 F (36.8 C), Min:97.9 F (36.6 C), Max:98.6 F (37 C)   Recent Labs Lab 12/27/16 1600 12/27/16 1607  WBC 6.9  --   CREATININE 7.06*  --   LATICACIDVEN  --  0.90    Estimated Creatinine Clearance: 15 mL/min (A) (by C-G formula based on SCr of 7.06 mg/dL (H)).    No Known Allergies  Antimicrobials this admission: Vancomycin 10/31 >>  Ceftriaxone 10/31>>  Microbiology results: 11/1 BCx:  11/1 MRSA PCR: pos 10/31 urine: >100K GNR   Thank you for allowing pharmacy to be a part of this patient's care.  [name] D. Yousuf Ager, PharmD, BCPS Clinical Pharmacist Pager: [phone] Clinical Phone for 12/29/2016 until 3:30pm: [phone] If after 3:30pm, please call main pharmacy at [phone] 12/29/2016 8:59 AM

## 2016-12-29 NOTE — Consult Note (Signed)
ORTHOPAEDIC CONSULTATION  REQUESTING PHYSICIAN: Alma Friendly, MD  Chief Complaint: Chronic venous insufficiency right leg.  HPI: [name] is a 61 y.o. male who presents with with chronic venous ulceration tight leg with left BKA  Past Medical History:  Diagnosis Date  . Anemia   . Anxiety   . AR (allergic rhinitis)   . Arthritis    osteoarthritis  . Blood transfusion   . BPH (benign prostatic hypertrophy)   . Cholelithiasis   . Chronic kidney disease    hx of kidney stones  . Chronic pain   . Diabetes mellitus    insulin dependent  . Displacement of lumbar intervertebral disc without myelopathy    L4-L5  . Diverticulosis of colon   . DVT (deep venous thrombosis) (HCC)    Lower Extremity  . Embolism and thrombosis of unspecified artery ([place])   . Gallstone   . History of nephrolithiasis    Bilateral  . Hyperlipidemia   . Hypertension   . Hypertrophy of prostate   . Hypothyroidism   . Impotence of organic origin   . Morbid obesity ([place])   . Nephrolithiasis   . Other lymphedema   . Pancreatitis, acute   . Peripheral vascular disease ([place])   . Personal history of arthritis    Osteoarthritis  . Sleep apnea    uses cpap  . Unspecified septicemia(038.9) [facility])    Past Surgical History:  Procedure Laterality Date  . AMPUTATION  02/07/2011   Procedure: AMPUTATION BELOW KNEE;  Surgeon: Newt Minion, MD;  Location: [facility];  Service: Orthopedics;  Laterality: Left;  Left Below Knee Amputation  . bilteral hip     replacement & revison  's   . CYSTOSCOPY W/ RETROGRADES Bilateral 10/24/2012   Procedure: CYSTOSCOPY WITH RETROGRADE PYELOGRAM Procedure: Cystoscopy, Bilateral Retrogarde Pyelograms, Bladder Biopsy, Hydrodistension;  Surgeon: Franchot Gallo, MD;  Location: [facility];  Service: Urology;  Laterality: Bilateral;  . KIDNEY SURGERY    . KNEE SURGERY     left knee    . LEG AMPUTATION BELOW KNEE    . REPLACEMENT TOTAL KNEE    . TONSILLECTOMY      Social History   Social History  . Marital status: Married    Spouse name: N/A  . Number of children: N/A  . Years of education: N/A   Social History Main Topics  . Smoking status: Never Smoker  . Smokeless tobacco: Never Used  . Alcohol use No  . Drug use: No  . Sexual activity: Yes   Other Topics Concern  . None   Social History Narrative   Lives with his wife and uses a wheelchair for transfers.     Family History  Problem Relation Age of Onset  . Diabetes Mother   . Heart attack Mother   . Stroke Mother   . Colon cancer Father   . Dementia Father   . Heart attack Father   . Arthritis Paternal Grandmother   . Hypertension Other   . Hypothyroidism Other    - negative except otherwise stated in the family history section No Known Allergies Prior to Admission medications   Medication Sig Start Date End Date Taking? Authorizing Provider  Ascorbic Acid (VITAMIN C PO) Take 1 tablet by mouth 2 (two) times daily.   Yes [provider]  atorvastatin (LIPITOR) 40 MG tablet TAKE 1 TABLET (40 MG TOTAL) BY MOUTH DAILY. 09/13/15  Yes Brunetta Jeans, PA-C  ferrous sulfate 325 (  65 FE) MG tablet Take 1 tablet (325 mg total) by mouth 2 (two) times daily. 08/10/14  Yes Brunetta Jeans, PA-C  gabapentin (NEURONTIN) 100 MG capsule TAKE 1 CAPSULE (100 MG TOTAL) BY MOUTH 3 (THREE) TIMES DAILY. 12/26/15  Yes Brunetta Jeans, PA-C  insulin aspart (NOVOLOG) 100 UNIT/ML injection Inject as directed 2 (two) times daily. Sliding scale 09/27/16  Yes [provider]  Insulin Glargine (BASAGLAR KWIKPEN) 100 UNIT/ML SOPN Inject 40 Units into the skin at bedtime. 09/23/15  Yes [provider]  levothyroxine (SYNTHROID, LEVOTHROID) 200 MCG tablet Take 1 tablet (200 mcg total) by mouth every morning. Patient taking differently: Take 225 mcg by mouth every morning.  01/10/16  Yes Brunetta Jeans, PA-C  methenamine (HIPREX) 1 g tablet Take 1 g by mouth 2 (two) times  daily. 09/27/15  Yes [provider]  midodrine (PROAMATINE) 5 MG tablet Take 5 mg by mouth. 06/02/16  Yes [provider]  Multiple Vitamins-Minerals (MULTIVITAMINS THER. W/MINERALS) TABS Take 1 tablet by mouth daily.    Yes [provider]  omega-3 acid ethyl esters (LOVAZA) 1 g capsule Take 1 g by mouth 2 (two) times daily.   Yes [provider]  sevelamer carbonate (RENVELA) 800 MG tablet Take 800 mg by mouth 3 (three) times daily with meals.   Yes [provider]   Ct Lumbar Spine Wo Contrast  Result Date: 12/27/2016 CLINICAL DATA:  Status post fall July 2018. Severe low back pain for 3 days. EXAM: CT LUMBAR SPINE WITHOUT CONTRAST TECHNIQUE: Multidetector CT imaging of the lumbar spine was performed without intravenous contrast administration. Multiplanar CT image reconstructions were also generated. COMPARISON:  None. FINDINGS: Segmentation: 5 lumbar type vertebrae. Alignment: Normal. Vertebrae: Severe osteopenia. Severe L5 vertebral body compression fracture with approximately 80% anterior height loss unchanged compared with 01/13/2016. Remainder the vertebral body heights are maintained. No aggressive osseous lesion. Bone destruction. Paraspinal and other soft tissues: No acute paraspinal abnormality. Bilateral atrophic kidneys with multiple cysts. Abdominal aortic atherosclerosis. Disc levels: Disc heights are maintained. At L1-2: Mild broad-based disc bulge. Mild bilateral facet arthropathy. No foraminal stenosis. At L2-3: Mild broad-based disc bulge. Mild bilateral facet arthropathy. No foraminal stenosis. At L3-4: Mild broad-based disc bulge. Mild bilateral facet arthropathy. Mild right foraminal stenosis. No left foraminal stenosis. At L4-5: Mild broad-based disc bulge. Moderate bilateral facet arthropathy, right worse than left. Mild right foraminal stenosis. No left foraminal stenosis. Central canal stenosis. At L5-S1: Broad-based disc osteophyte  complex. Mild bilateral facet arthropathy. Bilateral foraminal stenosis. IMPRESSION: 1.  No acute osseous injury of the lumbar spine. 2. Chronic L5 vertebral body compression fracture. 3. Lumbar spine spondylosis as described above. Electronically Signed   By: Kathreen Devoid   On: 12/27/2016 16:30   - pertinent xrays, CT, MRI studies were reviewed and independently interpreted  Positive ROS: All other systems have been reviewed and were otherwise negative with the exception of those mentioned in the HPI and as above.  Physical Exam: General: Alert, no acute distress Psychiatric: Patient is competent for consent with normal mood and affect Lymphatic: No axillary or cervical lymphadenopathy Cardiovascular: No pedal edema Respiratory: No cyanosis, no use of accessory musculature GI: No organomegaly, abdomen is soft and non-tender  Skin: brawney edema right leg, no cellulitis no drainage   Neurologic: Patient does not have protective sensation bilateral lower extremities.   MUSCULOSKELETAL:  Weak DP pulse, no sign of infection, no plantar ulcers, BKA left stable  Assessment: Chronic  venous insufficiency right leg, with stable left BKA.  Plan: Orders written for Unna wrap right leg, I'll follow up in office in 1 week  Thank you for the consult and the opportunity to see [name], [place] [phone] 11:42 AM

## 2016-12-29 NOTE — Progress Notes (Signed)
Orthopedic Tech Progress Note Patient Details:  [name] 1955-10-07 [id number]  Ortho Devices Type of Ortho Device: Louretta Parma boot Ortho Device/Splint Location: rle Ortho Device/Splint Interventions: Application   [name] 12/29/2016, 12:08 PM

## 2016-12-29 NOTE — Care Management Note (Signed)
Case Management Note  Patient Details  Name: TUKKER BYRNS MRN: [id number] Date of Birth: 05/12/1955  Subjective/Objective:                 Patient DC to [facility] SNF in July from [place]. He was recently DC'd back to home where he cannot take care of himself. Admitted for UTI, per RN has grey colored urine draining from urostomy, and multiple wounds to lower extremities, with at least one draining. Admitted from home w spouse, per PT rec will require hoyer lift if he is to DC back home, however SNF is recommended.    Action/Plan:   Expected Discharge Date:                  Expected Discharge Plan:  Skilled Nursing Facility  In-House Referral:  Clinical Social Work  Discharge planning Services  CM Consult  Post Acute Care Choice:    Choice offered to:     DME Arranged:    DME Agency:     HH Arranged:    [place] Agency:     Status of Service:  In process, will continue to follow  If discussed at Long Length of Stay Meetings, dates discussed:    Additional Comments:  Carles Collet, RN 12/29/2016, 11:30 AM

## 2016-12-30 LAB — GLUCOSE, CAPILLARY
GLUCOSE-CAPILLARY: 112 mg/dL — AB (ref 65–99)
Glucose-Capillary: 177 mg/dL — ABNORMAL HIGH (ref 65–99)
Glucose-Capillary: 148 mg/dL — ABNORMAL HIGH (ref 65–99)
Glucose-Capillary: 217 mg/dL — ABNORMAL HIGH (ref 65–99)

## 2016-12-30 LAB — CBC WITH DIFFERENTIAL/PLATELET
BASOS PCT: 0 %
Basophils Absolute: 0 10*3/uL (ref 0.0–0.1)
EOS ABS: 0.2 10*3/uL (ref 0.0–0.7)
Eosinophils Relative: 3 %
HCT: 26.9 % — ABNORMAL LOW (ref 39.0–52.0)
HEMOGLOBIN: 8.1 g/dL — AB (ref 13.0–17.0)
LYMPHS ABS: 1 10*3/uL (ref 0.7–4.0)
Lymphocytes Relative: 19 %
MCH: 28.9 pg (ref 26.0–34.0)
MCHC: 30.1 g/dL (ref 30.0–36.0)
MCV: 96.1 fL (ref 78.0–100.0)
MONO ABS: 0.6 10*3/uL (ref 0.1–1.0)
MONOS PCT: 10 %
NEUTROS PCT: 68 %
Neutro Abs: 3.7 10*3/uL (ref 1.7–7.7)
Platelets: 122 10*3/uL — ABNORMAL LOW (ref 150–400)
RBC: 2.8 MIL/uL — ABNORMAL LOW (ref 4.22–5.81)
RDW: 16.5 % — AB (ref 11.5–15.5)
WBC: 5.4 10*3/uL (ref 4.0–10.5)

## 2016-12-30 LAB — BASIC METABOLIC PANEL
Anion gap: 13 (ref 5–15)
BUN: 59 mg/dL — AB (ref 6–20)
CALCIUM: 7.7 mg/dL — AB (ref 8.9–10.3)
CHLORIDE: 97 mmol/L — AB (ref 101–111)
CO2: 24 mmol/L (ref 22–32)
CREATININE: 6.31 mg/dL — AB (ref 0.61–1.24)
GFR calc Af Amer: 10 mL/min — ABNORMAL LOW (ref >60)
GFR calc non Af Amer: 9 mL/min — ABNORMAL LOW (ref >60)
GLUCOSE: 132 mg/dL — AB (ref 65–99)
Potassium: 3.8 mmol/L (ref 3.5–5.1)
Sodium: 134 mmol/L — ABNORMAL LOW (ref 135–145)

## 2016-12-30 NOTE — Progress Notes (Signed)
PROGRESS NOTE  [name] TDD:[id number] DOB: 03-Nov-1955 DOA: 12/27/2016 PCP: Patient, No Pcp Per  HPI/Recap of past 59 hours: 61 year old male with a medical history significant for hypertension, hyperlipidemia, diabetes, hypothyroidism, anxiety, morbid obesity, peripheral vascular disease, ESRD, urostomy, left BKA presents with lower back pain, foul-smelling urine, right leg wound with draining.  Today, patient's reports feeling better, denies worsening back pain, chest pain, abdominal pain, nausea/vomiting, diarrhea, fever/chills.  Assessment/Plan: Principal Problem:   Lower back pain Active Problems:   Type 2 diabetes mellitus with diabetic nephropathy, with long-term current use of insulin (HCC)   Essential hypertension   Hyperkalemia   Hypothyroidism, acquired   Cellulitis of left leg   Hyperlipidemia   ESRD on dialysis [facility])   UTI (urinary tract infection)   Idiopathic chronic venous hypertension of right lower extremity with inflammation  *Chronic low back pain Improved CT lumbar spine showed chronic L5 vertebral body compression fracture Pain management PT/OT Case management social worker for placement  #Complicated UTI Status post urostomy, 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 coli sensitive to ceftriaxone Continue Rocephin  #Cellulitis of the left BKA stump Afebrile, no leukocytosis CRP elevated Lower extremity Doppler, negative Consider MRI to rule out osteomyelitis Continue IV vancomycin Wound care consult  #Right leg lymphedema Also on consult, recommend Unna wrap  #Type 2 diabetes mellitus Continue insulin regimen, SSI  #Hypertension Not on any meds Monitor  #ESRD Nephrology on board, dialysis T/T/S  #Hypothyroidism Continue home Synthroid     Code Status: Full  Family Communication: None at bedside  Disposition Plan: Possible  SNF   Consultants:  Nephrology  Orthopedics  Procedures:  None  Antimicrobials:  Vancomycin  Rocephin  DVT prophylaxis:  Heparin   Objective: Vitals:   12/30/16 1700 12/30/16 1730 12/30/16 1739 12/30/16 2059  BP: 96/61 101/68 114/70 119/62  Pulse: 73 77 77 85  Resp: 15 16 17 17   Temp:   98.1 F (36.7 C) 98.6 F (37 C)  TempSrc:   Oral Oral  SpO2:   98% 96%  Weight:   121.5 kg (267 lb 13.7 oz) 122.8 kg (270 lb 12.8 oz)  Height:        Intake/Output Summary (Last 24 hours) at 12/30/16 2149 Last data filed at 12/30/16 2100  Gross per 24 hour  Intake              942 ml  Output             2500 ml  Net            -1558 ml   Filed Weights   12/28/16 2037 12/30/16 1739 12/30/16 2059  Weight: 120.3 kg (265 lb 3.4 oz) 121.5 kg (267 lb 13.7 oz) 122.8 kg (270 lb 12.8 oz)    Exam:   General: Awake alert, oriented x3  Cardiovascular: S1-S2 present, no added heart sounds  Respiratory: Clear to auscultation bilaterally  Abdomen: Obese, soft, nontender.  Urostomy bag on the right side of the abdomen with frothy urine  Musculoskeletal: Left BKA with stump ulcer drainage.  Right leg lymphedema  Skin: Normal  Psychiatry: Normal mood   Data Reviewed: CBC:  Recent Labs Lab 12/27/16 1600 12/30/16 0404  WBC 6.9 5.4  NEUTROABS 5.6 3.7  HGB 8.6* 8.1*  HCT 28.8* 26.9*  MCV 96.3 96.1  PLT 136* 623*   Basic Metabolic Panel:  Recent Labs Lab 12/27/16 1600 12/30/16 0404  NA 134* 134*  K 5.4* 3.8  CL 100* 97*  CO2 22 24  GLUCOSE 128* 132*  BUN 74* 59*  CREATININE 7.06* 6.31*  CALCIUM 8.1* 7.7*   GFR: Estimated Creatinine Clearance: 16.9 mL/min (A) (by C-G formula based on SCr of 6.31 mg/dL (H)). Liver Function Tests:  Recent Labs Lab 12/27/16 1600  AST 9*  ALT 9*  ALKPHOS 111  BILITOT 0.8  PROT 6.4*  ALBUMIN 2.5*   No results for input(s): LIPASE, AMYLASE in the last 168 hours. No results for input(s): AMMONIA in the last 168  hours. Coagulation Profile: No results for input(s): INR, PROTIME in the last 168 hours. Cardiac Enzymes: No results for input(s): CKTOTAL, CKMB, CKMBINDEX, TROPONINI in the last 168 hours. BNP (last 3 results) No results for input(s): PROBNP in the last 8760 hours. HbA1C: No results for input(s): HGBA1C in the last 72 hours. CBG:  Recent Labs Lab 12/29/16 2100 12/30/16 0824 12/30/16 1220 12/30/16 1902 12/30/16 2146  GLUCAP 141* 112* 177* 148* 217*   Lipid Profile: No results for input(s): CHOL, HDL, LDLCALC, TRIG, CHOLHDL, LDLDIRECT in the last 72 hours. Thyroid Function Tests: No results for input(s): TSH, T4TOTAL, FREET4, T3FREE, THYROIDAB in the last 72 hours. Anemia Panel: No results for input(s): VITAMINB12, FOLATE, FERRITIN, TIBC, IRON, RETICCTPCT in the last 72 hours. Urine analysis:    Component Value Date/Time   COLORURINE AMBER (A) 12/27/2016 1601   APPEARANCEUR TURBID (A) 12/27/2016 1601   LABSPEC >1.046 (H) 12/27/2016 1601   PHURINE 5.0 12/27/2016 1601   GLUCOSEU NEGATIVE 12/27/2016 1601   HGBUR NEGATIVE 12/27/2016 1601   BILIRUBINUR NEGATIVE 12/27/2016 1601   BILIRUBINUR neg 08/10/2014 1637   KETONESUR NEGATIVE 12/27/2016 1601   PROTEINUR NEGATIVE 12/27/2016 1601   UROBILINOGEN 1.0 08/10/2014 1637   UROBILINOGEN 0.2 08/03/2014 1225   NITRITE NEGATIVE 12/27/2016 1601   LEUKOCYTESUR TRACE (A) 12/27/2016 1601   Sepsis Labs: @LABRCNTIP (procalcitonin:4,lacticidven:4)  ) Recent Results (from the past 240 hour(s))  Urine culture     Status: Abnormal   Collection Time: 12/27/16  4:01 PM  Result Value Ref Range Status   Specimen Description URINE, RANDOM  Final   Special Requests NONE  Final   Culture >=100,000 COLONIES/mL ESCHERICHIA COLI (A)  Final   Report Status 12/29/2016 FINAL  Final   Organism ID, Bacteria ESCHERICHIA COLI (A)  Final      Susceptibility   Escherichia coli - MIC*    AMPICILLIN >=32 RESISTANT Resistant     CEFAZOLIN <=4 SENSITIVE  Sensitive     CEFTRIAXONE <=1 SENSITIVE Sensitive     CIPROFLOXACIN >=4 RESISTANT Resistant     GENTAMICIN >=16 RESISTANT Resistant     IMIPENEM <=0.25 SENSITIVE Sensitive     NITROFURANTOIN <=16 SENSITIVE Sensitive     TRIMETH/SULFA >=320 RESISTANT Resistant     AMPICILLIN/SULBACTAM >=32 RESISTANT Resistant     PIP/TAZO <=4 SENSITIVE Sensitive     Extended ESBL NEGATIVE Sensitive     * >=100,000 COLONIES/mL ESCHERICHIA COLI  Culture, blood (Routine X 2) w Reflex to ID Panel     Status: None (Preliminary result)   Collection Time: 12/28/16  3:05 AM  Result Value Ref Range Status   Specimen Description BLOOD LEFT ARM  Final   Special Requests IN PEDIATRIC BOTTLE Blood Culture adequate volume  Final   Culture NO GROWTH 2 DAYS  Final   Report Status PENDING  Incomplete  MRSA PCR Screening     Status: Abnormal   Collection Time: 12/28/16  7:04 AM  Result Value Ref Range  Status   MRSA by PCR POSITIVE (A) NEGATIVE Final    Comment:        The GeneXpert MRSA Assay (FDA approved for NASAL specimens only), is one component of a comprehensive MRSA colonization surveillance program. It is not intended to diagnose MRSA infection nor to guide or monitor treatment for MRSA infections. RESULT CALLED TO, READ BACK BY AND VERIFIED WITH: Gordy Clement RN 9:45 12/28/16 ([email or web address])       Studies: No results found.  Scheduled Meds: . atorvastatin  40 mg Oral q1800  . Chlorhexidine Gluconate Cloth  6 each Topical Q0600  . feeding supplement (PRO-STAT SUGAR FREE 64)  30 mL Oral BID  . ferrous sulfate  325 mg Oral BID  . heparin  5,000 Units Subcutaneous Q8H  . insulin aspart  0-9 Units Subcutaneous TID WC  . insulin glargine  28 Units Subcutaneous QHS  . levothyroxine  225 mcg Oral QAC breakfast  . midodrine  10 mg Oral Daily  . multivitamin with minerals  1 tablet Oral Daily  . mupirocin ointment  1 application Nasal BID  . omega-3 acid ethyl esters  1 g Oral BID  . sevelamer carbonate  800  mg Oral TID WC  . vitamin C  500 mg Oral BID    Continuous Infusions: . cefTRIAXone (ROCEPHIN)  IV Stopped (12/29/16 1909)  . vancomycin Stopped (12/28/16 2031)     LOS: 3 days     Alma Friendly, MD [facility]   If 7PM-7AM, please contact night-coverage [email or web address] Password [facility] 12/30/2016, 9:49 PM

## 2016-12-30 NOTE — Progress Notes (Signed)
Pt gone to dialysis via bed.   

## 2016-12-30 NOTE — Progress Notes (Signed)
[name] Progress Note   Subjective:  Seen in room. Tired, didn't sleep well. Denies CP, SOB, back pain, abdominal pain, leg pain   Objective Vitals:   12/29/16 1754 12/29/16 2103 12/30/16 0510 12/30/16 0912  BP: (!) 99/58 124/64 107/60 (!) 124/59  Pulse: 68 74 73 80  Resp: 18 18 16 17   Temp: 98 F (36.7 C) 97.8 F (36.6 C) 98 F (36.7 C) 98 F (36.7 C)  TempSrc: Oral Oral Oral Oral  SpO2: 96% 97% 99% 99%  Weight:      Height:       Physical Exam General: Obese male, NAD. Heart: RRR; no murmur Lungs: CTA anteriorly Abdomen: soft, non-tender, urostomy with green fluid  Extremities: RLE without edema/+hyperpigmentation/small wound, L stump edema  Dialysis Access: RUE AVF + bruit  Additional Objective Labs: Basic Metabolic Panel:  Recent Labs Lab 12/27/16 1600 12/30/16 0404  NA 134* 134*  K 5.4* 3.8  CL 100* 97*  CO2 22 24  GLUCOSE 128* 132*  BUN 74* 59*  CREATININE 7.06* 6.31*  CALCIUM 8.1* 7.7*   Liver Function Tests:  Recent Labs Lab 12/27/16 1600  AST 9*  ALT 9*  ALKPHOS 111  BILITOT 0.8  PROT 6.4*  ALBUMIN 2.5*   CBC:  Recent Labs Lab 12/27/16 1600 12/30/16 0404  WBC 6.9 5.4  NEUTROABS 5.6 3.7  HGB 8.6* 8.1*  HCT 28.8* 26.9*  MCV 96.3 96.1  PLT 136* 122*   Blood Culture    Component Value Date/Time   SDES BLOOD LEFT ARM 12/28/2016 0305   SPECREQUEST IN PEDIATRIC BOTTLE Blood Culture adequate volume 12/28/2016 0305   CULT NO GROWTH 1 DAY 12/28/2016 0305   REPTSTATUS PENDING 12/28/2016 0305   CBG:  Recent Labs Lab 12/29/16 0748 12/29/16 1157 12/29/16 1658 12/29/16 2100 12/30/16 0824  GLUCAP 155* 193* 145* 141* 112*   Studies/Results: No results found. Medications: . cefTRIAXone (ROCEPHIN)  IV Stopped (12/29/16 1909)  . vancomycin Stopped (12/28/16 2031)   . atorvastatin  40 mg Oral q1800  . Chlorhexidine Gluconate Cloth  6 each Topical Q0600  . feeding supplement (PRO-STAT SUGAR FREE 64)  30 mL Oral  BID  . ferrous sulfate  325 mg Oral BID  . heparin  5,000 Units Subcutaneous Q8H  . insulin aspart  0-9 Units Subcutaneous TID WC  . insulin glargine  28 Units Subcutaneous QHS  . levothyroxine  225 mcg Oral QAC breakfast  . methenamine  1 g Oral BID  . midodrine  10 mg Oral Daily  . multivitamin with minerals  1 tablet Oral Daily  . mupirocin ointment  1 application Nasal BID  . omega-3 acid ethyl esters  1 g Oral BID  . sevelamer carbonate  800 mg Oral TID WC  . vitamin C  500 mg Oral BID    Dialysis Orders: [facility] on [address]  TTS 4h 51min   2/2.5   117.5kg   RUE AVF   Hep none Mircera 200 q 2 wks (last 10/25) No VDRA   Assessment/Plan: 1. Low Back Pain: Chronic L5 compression fracture, pain improved. Per primary. 2. UTI (chronic urostomy):  UCx + E. Coli on Ceftriaxone.   3. R Leg wound/L RKA : Unna wrap per ortho IV Vanc has been added. 4. ESRD: Will continue HD per TTS schedule, HD today 11/3. No heparin. 5. HypoTN/volume: BP low side, on midodrine. UF as tolerated. 6. Anemia: Hgb trending down,  Not due for ESA yet. Follow. On PO  iron BID. 7. Secondary hyperparathyroidism: Ca ok, Phos pending. Continue Renvela as binder. 8. Nutrition: Alb very low. Adding pro-stat BID. 9. DM: On insulin, per primary.  Lynnda Child PA-C [name] Pager [phone] 12/30/2016,11:28 AM   Pt seen, examined and agree w A/P as above. EColi UTI, on Rocephin and afebrile.  Vanc added for stump infection/ cellulitis.  Dispo per primary.  HD today.   Kelly Splinter MD [employer] pager [phone]   12/30/2016, 12:31 PM

## 2016-12-31 ENCOUNTER — Encounter (HOSPITAL_COMMUNITY): Payer: Self-pay | Admitting: *Deleted

## 2016-12-31 ENCOUNTER — Other Ambulatory Visit: Payer: Self-pay

## 2016-12-31 LAB — CBC WITH DIFFERENTIAL/PLATELET
Basophils Absolute: 0 10*3/uL (ref 0.0–0.1)
Basophils Relative: 0 %
EOS ABS: 0.1 10*3/uL (ref 0.0–0.7)
EOS PCT: 3 %
HCT: 27.1 % — ABNORMAL LOW (ref 39.0–52.0)
Hemoglobin: 8.1 g/dL — ABNORMAL LOW (ref 13.0–17.0)
LYMPHS ABS: 0.9 10*3/uL (ref 0.7–4.0)
Lymphocytes Relative: 19 %
MCH: 28.8 pg (ref 26.0–34.0)
MCHC: 29.9 g/dL — AB (ref 30.0–36.0)
MCV: 96.4 fL (ref 78.0–100.0)
MONOS PCT: 11 %
Monocytes Absolute: 0.5 10*3/uL (ref 0.1–1.0)
Neutro Abs: 3.2 10*3/uL (ref 1.7–7.7)
Neutrophils Relative %: 67 %
PLATELETS: 110 10*3/uL — AB (ref 150–400)
RBC: 2.81 MIL/uL — AB (ref 4.22–5.81)
RDW: 16.4 % — ABNORMAL HIGH (ref 11.5–15.5)
WBC: 4.8 10*3/uL (ref 4.0–10.5)

## 2016-12-31 LAB — GLUCOSE, CAPILLARY
GLUCOSE-CAPILLARY: 164 mg/dL — AB (ref 65–99)
GLUCOSE-CAPILLARY: 141 mg/dL — AB (ref 65–99)
Glucose-Capillary: 179 mg/dL — ABNORMAL HIGH (ref 65–99)
Glucose-Capillary: 228 mg/dL — ABNORMAL HIGH (ref 65–99)

## 2016-12-31 LAB — BASIC METABOLIC PANEL
Anion gap: 11 (ref 5–15)
BUN: 41 mg/dL — AB (ref 6–20)
CALCIUM: 7.7 mg/dL — AB (ref 8.9–10.3)
CO2: 25 mmol/L (ref 22–32)
Chloride: 99 mmol/L — ABNORMAL LOW (ref 101–111)
Creatinine, Ser: 5.48 mg/dL — ABNORMAL HIGH (ref 0.61–1.24)
GFR calc Af Amer: 12 mL/min — ABNORMAL LOW (ref >60)
GFR, EST NON AFRICAN AMERICAN: 10 mL/min — AB (ref >60)
Glucose, Bld: 123 mg/dL — ABNORMAL HIGH (ref 65–99)
POTASSIUM: 4.1 mmol/L (ref 3.5–5.1)
Sodium: 135 mmol/L (ref 135–145)

## 2016-12-31 LAB — IRON AND TIBC
Iron: 34 ug/dL — ABNORMAL LOW (ref 45–182)
SATURATION RATIOS: 19 % (ref 17.9–39.5)
TIBC: 178 ug/dL — AB (ref 250–450)
UIBC: 144 ug/dL

## 2016-12-31 LAB — C-REACTIVE PROTEIN: CRP: 4.3 mg/dL — AB (ref <1.0)

## 2016-12-31 LAB — FERRITIN: Ferritin: 518 ng/mL — ABNORMAL HIGH (ref 24–336)

## 2016-12-31 LAB — SEDIMENTATION RATE: SED RATE: 67 mm/h — AB (ref 0–16)

## 2016-12-31 MED ORDER — VANCOMYCIN HCL IN DEXTROSE 1-5 GM/200ML-% IV SOLN
1000.0000 mg | Freq: Once | INTRAVENOUS | Status: AC
  Administered 2016-12-31: 1000 mg via INTRAVENOUS
  Filled 2016-12-31: qty 200

## 2016-12-31 NOTE — Progress Notes (Signed)
[facility] Progress Note   Subjective:  Seen in room. No c/o today  Objective Vitals:   12/30/16 1730 12/30/16 1739 12/30/16 2059 12/31/16 0601  BP: 101/68 114/70 119/62 107/65  Pulse: 77 77 85 73  Resp: 16 17 17 18   Temp:  98.1 F (36.7 C) 98.6 F (37 C) 98.8 F (37.1 C)  TempSrc:  Oral Oral Oral  SpO2:  98% 96% 98%  Weight:  121.5 kg (267 lb 13.7 oz) 122.8 kg (270 lb 12.8 oz)   Height:       Physical Exam General: Obese male, NAD. Heart: RRR; no murmur Lungs: CTA anteriorly Abdomen: soft, non-tender, urostomy with green fluid  Extremities: RLE without edema/+hyperpigmentation/small wound, L stump edema  Dialysis Access: RUE AVF + bruit  Additional Objective Labs: Basic Metabolic Panel: Recent Labs  Lab 12/27/16 1600 12/30/16 0404 12/31/16 0326  NA 134* 134* 135  K 5.4* 3.8 4.1  CL 100* 97* 99*  CO2 22 24 25   GLUCOSE 128* 132* 123*  BUN 74* 59* 41*  CREATININE 7.06* 6.31* 5.48*  CALCIUM 8.1* 7.7* 7.7*   Liver Function Tests: Recent Labs  Lab 12/27/16 1600  AST 9*  ALT 9*  ALKPHOS 111  BILITOT 0.8  PROT 6.4*  ALBUMIN 2.5*   CBC: Recent Labs  Lab 12/27/16 1600 12/30/16 0404 12/31/16 0326  WBC 6.9 5.4 4.8  NEUTROABS 5.6 3.7 3.2  HGB 8.6* 8.1* 8.1*  HCT 28.8* 26.9* 27.1*  MCV 96.3 96.1 96.4  PLT 136* 122* 110*   Blood Culture    Component Value Date/Time   SDES BLOOD LEFT ARM 12/28/2016 0305   SPECREQUEST IN PEDIATRIC BOTTLE Blood Culture adequate volume 12/28/2016 0305   CULT NO GROWTH 2 DAYS 12/28/2016 0305   REPTSTATUS PENDING 12/28/2016 0305   CBG: Recent Labs  Lab 12/29/16 2100 12/30/16 0824 12/30/16 1220 12/30/16 1902 12/30/16 2146  GLUCAP 141* 112* 177* 148* 217*   Studies/Results: No results found. Medications: . cefTRIAXone (ROCEPHIN)  IV Stopped (12/30/16 2303)  . vancomycin Stopped (12/28/16 2031)   . atorvastatin  40 mg Oral q1800  . Chlorhexidine Gluconate Cloth  6 each Topical Q0600  . feeding  supplement (PRO-STAT SUGAR FREE 64)  30 mL Oral BID  . ferrous sulfate  325 mg Oral BID  . heparin  5,000 Units Subcutaneous Q8H  . insulin aspart  0-9 Units Subcutaneous TID WC  . insulin glargine  28 Units Subcutaneous QHS  . levothyroxine  225 mcg Oral QAC breakfast  . midodrine  10 mg Oral Daily  . multivitamin with minerals  1 tablet Oral Daily  . mupirocin ointment  1 application Nasal BID  . omega-3 acid ethyl esters  1 g Oral BID  . sevelamer carbonate  800 mg Oral TID WC  . vitamin C  500 mg Oral BID    Dialysis Orders: [facility] on [address]  TTS 4h 75min   2/2.5   117.5kg   RUE AVF   Hep none Mircera 200 q 2 wks (last 10/25) No VDRA   Assessment: 1. Low Back Pain: Chronic L5 compression fracture, pain improved. Per primary. 2. UTI (chronic urostomy):  UCx + E. Coli on Ceftriaxone.   3. R Leg wound/L RKA : Unna wrap per ortho. IV Vanc has been added. 4. ESRD: Will continue HD per TTS schedule. No heparin. 5. HypoTN/volume: BP low side, on midodrine. Up 5kg + by wts.  6. Anemia: Hgb trending down,  Not due for ESA  yet. Follow. On PO iron BID. 7. Secondary hyperparathyroidism: Ca ok, Phos pending. Continue Renvela as binder. 8. Nutrition: Alb very low. Adding pro-stat BID. 9. DM: On insulin, per primary. 10. Dispo: per primary svc, is prob for SNF placement  Plan - next HD on Tuesday     Rob Waimea MD [place] pager [phone]   12/31/2016, 7:00 AM

## 2016-12-31 NOTE — Progress Notes (Signed)
Pharmacy Antibiotic Note  [name] is a 60 y.o. male admitted on 12/27/2016 with cellulitis.  Pharmacy has been consulted for vancomycin dosing.   Patient is ESRD on HD TTSat- went to HD yesterday and tolerated 3h @ BFR 365mL/min- did NOT receive vancomycin after HD session  WBC down, afebrile. CRP trended down and LA normal  Plan: Vancomycin 1000mg  IV x1 to be given on the floor, then resume vancomycin 1g IV qHD-TTSat Ceftriaxone 1g IV q24h per MD F/u clinical progression, c/s, de-escalation plan/LOT, HD schedule/tolerance, pre-HD level PRN   Height: 5' 11.5" (181.6 cm) Weight: 270 lb 12.8 oz (122.8 kg) IBW/kg (Calculated) : 76.45  Temp (24hrs), Avg:98.4 F (36.9 C), Min:97.9 F (36.6 C), Max:98.8 F (37.1 C)  Recent Labs  Lab 12/27/16 1600 12/27/16 1607 12/30/16 0404 12/31/16 0326  WBC 6.9  --  5.4 4.8  CREATININE 7.06*  --  6.31* 5.48*  LATICACIDVEN  --  0.90  --   --     Estimated Creatinine Clearance: 19.5 mL/min (A) (by C-G formula based on SCr of 5.48 mg/dL (H)).    No Known Allergies  Antimicrobials this admission: Vancomycin 10/31 >>  Ceftriaxone 10/31>>  Microbiology results: 11/1 BCx: ngtd 11/1 MRSA PCR: pos 10/31 urine: >100K EColi, R to ampicillin/Unasyn, cipro, Bactrim   Thank you for allowing pharmacy to be a part of this patient's care.  Abriel Hattery D. Anamari Galeas, PharmD, BCPS Clinical Pharmacist Pager: [phone] Clinical Phone for 12/31/2016 until 3:30pm: [phone] If after 3:30pm, please call main pharmacy at [phone] 12/31/2016 11:10 AM

## 2016-12-31 NOTE — Progress Notes (Signed)
PROGRESS NOTE  [name] TDH:[id number] DOB: 1955/05/28 DOA: 12/27/2016 PCP: Patient, No Pcp Per  HPI/Recap of past 98 hours: 61 year old male with a medical history significant for hypertension, hyperlipidemia, diabetes, hypothyroidism, anxiety, morbid obesity, peripheral vascular disease, ESRD, urostomy, left BKA presents with lower back pain, foul-smelling urine, right leg wound with draining.  Today, patient's reports feeling better, denies worsening back pain, chest pain, abdominal pain, nausea/vomiting, diarrhea, fever/chills.  Assessment/Plan: Principal Problem:   Lower back pain Active Problems:   Type 2 diabetes mellitus with diabetic nephropathy, with long-term current use of insulin (HCC)   Essential hypertension   Hyperkalemia   Hypothyroidism, acquired   Cellulitis of left leg   Hyperlipidemia   ESRD on dialysis [facility])   UTI (urinary tract infection)   Idiopathic chronic venous hypertension of right lower extremity with inflammation  *Chronic low back pain Improved CT lumbar spine showed chronic L5 vertebral body compression fracture Pain management PT/OT Case management social worker for placement  #Complicated UTI Status post urostomy, 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 coli sensitive to ceftriaxone Continue Rocephin  #Cellulitis of the left BKA stump Afebrile, no leukocytosis CRP elevated, trending downwards Lower extremity Doppler, negative Consider MRI to rule out osteomyelitis Continue IV vancomycin Wound care consult  #Right leg lymphedema Also on consult, recommend Unna wrap  #Type 2 diabetes mellitus Continue insulin regimen, SSI  #Hypertension Not on any meds Monitor  #ESRD Nephrology on board, dialysis T/T/S  #Hypothyroidism Continue home Synthroid     Code Status: Full  Family Communication: None at bedside  Disposition Plan: Possible  SNF   Consultants:  Nephrology  Orthopedics  Procedures:  None  Antimicrobials:  Vancomycin  Rocephin  DVT prophylaxis:  Heparin   Objective: Vitals:   12/30/16 2059 12/31/16 0601 12/31/16 0948 12/31/16 1700  BP: 119/62 107/65 109/64 127/64  Pulse: 85 73 72 69  Resp: 17 18 18 18   Temp: 98.6 F (37 C) 98.8 F (37.1 C) 98.4 F (36.9 C) 98.4 F (36.9 C)  TempSrc: Oral Oral Oral Oral  SpO2: 96% 98% 99% 99%  Weight: 122.8 kg (270 lb 12.8 oz)     Height:        Intake/Output Summary (Last 24 hours) at 12/31/2016 1853 Last data filed at 12/31/2016 1700 Gross per 24 hour  Intake 942 ml  Output 275 ml  Net 667 ml   Filed Weights   12/28/16 2037 12/30/16 1739 12/30/16 2059  Weight: 120.3 kg (265 lb 3.4 oz) 121.5 kg (267 lb 13.7 oz) 122.8 kg (270 lb 12.8 oz)    Exam:   General: Awake alert, oriented x3  Cardiovascular: S1-S2 present, no added heart sounds  Respiratory: Clear to auscultation bilaterally  Abdomen: Obese, soft, nontender.  Urostomy bag on the right side of the abdomen with frothy urine  Musculoskeletal: Left BKA with stump ulcer drainage.  Right leg lymphedema  Skin: Normal  Psychiatry: Normal mood   Data Reviewed: CBC: Recent Labs  Lab 12/27/16 1600 12/30/16 0404 12/31/16 0326  WBC 6.9 5.4 4.8  NEUTROABS 5.6 3.7 3.2  HGB 8.6* 8.1* 8.1*  HCT 28.8* 26.9* 27.1*  MCV 96.3 96.1 96.4  PLT 136* 122* 646*   Basic Metabolic Panel: Recent Labs  Lab 12/27/16 1600 12/30/16 0404 12/31/16 0326  NA 134* 134* 135  K 5.4* 3.8 4.1  CL 100* 97* 99*  CO2 22 24 25   GLUCOSE 128* 132* 123*  BUN 74* 59* 41*  CREATININE  7.06* 6.31* 5.48*  CALCIUM 8.1* 7.7* 7.7*   GFR: Estimated Creatinine Clearance: 19.5 mL/min (A) (by C-G formula based on SCr of 5.48 mg/dL (H)). Liver Function Tests: Recent Labs  Lab 12/27/16 1600  AST 9*  ALT 9*  ALKPHOS 111  BILITOT 0.8  PROT 6.4*  ALBUMIN 2.5*   No results for input(s): LIPASE, AMYLASE in  the last 168 hours. No results for input(s): AMMONIA in the last 168 hours. Coagulation Profile: No results for input(s): INR, PROTIME in the last 168 hours. Cardiac Enzymes: No results for input(s): CKTOTAL, CKMB, CKMBINDEX, TROPONINI in the last 168 hours. BNP (last 3 results) No results for input(s): PROBNP in the last 8760 hours. HbA1C: No results for input(s): HGBA1C in the last 72 hours. CBG: Recent Labs  Lab 12/30/16 1902 12/30/16 2146 12/31/16 1007 12/31/16 1233 12/31/16 1709  GLUCAP 148* 217* 179* 228* 164*   Lipid Profile: No results for input(s): CHOL, HDL, LDLCALC, TRIG, CHOLHDL, LDLDIRECT in the last 72 hours. Thyroid Function Tests: No results for input(s): TSH, T4TOTAL, FREET4, T3FREE, THYROIDAB in the last 72 hours. Anemia Panel: Recent Labs    12/31/16 0326  FERRITIN 518*  TIBC 178*  IRON 34*   Urine analysis:    Component Value Date/Time   COLORURINE AMBER (A) 12/27/2016 1601   APPEARANCEUR TURBID (A) 12/27/2016 1601   LABSPEC >1.046 (H) 12/27/2016 1601   PHURINE 5.0 12/27/2016 1601   GLUCOSEU NEGATIVE 12/27/2016 1601   HGBUR NEGATIVE 12/27/2016 1601   BILIRUBINUR NEGATIVE 12/27/2016 1601   BILIRUBINUR neg 08/10/2014 1637   KETONESUR NEGATIVE 12/27/2016 1601   PROTEINUR NEGATIVE 12/27/2016 1601   UROBILINOGEN 1.0 08/10/2014 1637   UROBILINOGEN 0.2 08/03/2014 1225   NITRITE NEGATIVE 12/27/2016 1601   LEUKOCYTESUR TRACE (A) 12/27/2016 1601   Sepsis Labs: @LABRCNTIP (procalcitonin:4,lacticidven:4)  ) Recent Results (from the past 240 hour(s))  Urine culture     Status: Abnormal   Collection Time: 12/27/16  4:01 PM  Result Value Ref Range Status   Specimen Description URINE, RANDOM  Final   Special Requests NONE  Final   Culture >=100,000 COLONIES/mL ESCHERICHIA COLI (A)  Final   Report Status 12/29/2016 FINAL  Final   Organism ID, Bacteria ESCHERICHIA COLI (A)  Final      Susceptibility   Escherichia coli - MIC*    AMPICILLIN >=32  RESISTANT Resistant     CEFAZOLIN <=4 SENSITIVE Sensitive     CEFTRIAXONE <=1 SENSITIVE Sensitive     CIPROFLOXACIN >=4 RESISTANT Resistant     GENTAMICIN >=16 RESISTANT Resistant     IMIPENEM <=0.25 SENSITIVE Sensitive     NITROFURANTOIN <=16 SENSITIVE Sensitive     TRIMETH/SULFA >=320 RESISTANT Resistant     AMPICILLIN/SULBACTAM >=32 RESISTANT Resistant     PIP/TAZO <=4 SENSITIVE Sensitive     Extended ESBL NEGATIVE Sensitive     * >=100,000 COLONIES/mL ESCHERICHIA COLI  Culture, blood (Routine X 2) w Reflex to ID Panel     Status: None (Preliminary result)   Collection Time: 12/28/16  3:05 AM  Result Value Ref Range Status   Specimen Description BLOOD LEFT ARM  Final   Special Requests IN PEDIATRIC BOTTLE Blood Culture adequate volume  Final   Culture NO GROWTH 3 DAYS  Final   Report Status PENDING  Incomplete  MRSA PCR Screening     Status: Abnormal   Collection Time: 12/28/16  7:04 AM  Result Value Ref Range Status   MRSA by PCR POSITIVE (A) NEGATIVE Final  Comment:        The GeneXpert MRSA Assay (FDA approved for NASAL specimens only), is one component of a comprehensive MRSA colonization surveillance program. It is not intended to diagnose MRSA infection nor to guide or monitor treatment for MRSA infections. RESULT CALLED TO, READ BACK BY AND VERIFIED WITH: Gordy Clement RN 9:45 12/28/16 ([email or web address])       Studies: No results found.  Scheduled Meds: . atorvastatin  40 mg Oral q1800  . Chlorhexidine Gluconate Cloth  6 each Topical Q0600  . feeding supplement (PRO-STAT SUGAR FREE 64)  30 mL Oral BID  . ferrous sulfate  325 mg Oral BID  . heparin  5,000 Units Subcutaneous Q8H  . insulin aspart  0-9 Units Subcutaneous TID WC  . insulin glargine  28 Units Subcutaneous QHS  . levothyroxine  225 mcg Oral QAC breakfast  . midodrine  10 mg Oral Daily  . multivitamin with minerals  1 tablet Oral Daily  . mupirocin ointment  1 application Nasal BID  . omega-3 acid ethyl  esters  1 g Oral BID  . sevelamer carbonate  800 mg Oral TID WC  . vitamin C  500 mg Oral BID    Continuous Infusions: . cefTRIAXone (ROCEPHIN)  IV Stopped (12/30/16 2303)  . vancomycin Stopped (12/28/16 2031)     LOS: 4 days     Alma Friendly, MD [facility]   If 7PM-7AM, please contact night-coverage [email or web address] Password [facility] 12/31/2016, 6:53 PM

## 2017-01-01 LAB — CBC WITH DIFFERENTIAL/PLATELET
Basophils Absolute: 0 K/uL (ref 0.0–0.1)
Basophils Relative: 0 %
Eosinophils Absolute: 0.1 K/uL (ref 0.0–0.7)
Eosinophils Relative: 2 %
HCT: 26.6 % — ABNORMAL LOW (ref 39.0–52.0)
Hemoglobin: 7.9 g/dL — ABNORMAL LOW (ref 13.0–17.0)
Lymphocytes Relative: 21 %
Lymphs Abs: 1 K/uL (ref 0.7–4.0)
MCH: 28.8 pg (ref 26.0–34.0)
MCHC: 29.7 g/dL — ABNORMAL LOW (ref 30.0–36.0)
MCV: 97.1 fL (ref 78.0–100.0)
Monocytes Absolute: 0.4 K/uL (ref 0.1–1.0)
Monocytes Relative: 8 %
Neutro Abs: 3.4 K/uL (ref 1.7–7.7)
Neutrophils Relative %: 69 %
Platelets: 104 K/uL — ABNORMAL LOW (ref 150–400)
RBC: 2.74 MIL/uL — ABNORMAL LOW (ref 4.22–5.81)
RDW: 16.4 % — ABNORMAL HIGH (ref 11.5–15.5)
WBC: 4.9 K/uL (ref 4.0–10.5)

## 2017-01-01 LAB — GLUCOSE, CAPILLARY
GLUCOSE-CAPILLARY: 120 mg/dL — AB (ref 65–99)
GLUCOSE-CAPILLARY: 157 mg/dL — AB (ref 65–99)
GLUCOSE-CAPILLARY: 132 mg/dL — AB (ref 65–99)
GLUCOSE-CAPILLARY: 118 mg/dL — AB (ref 65–99)

## 2017-01-01 NOTE — Progress Notes (Signed)
PROGRESS NOTE  [name]:[id number] DOB: 1955-06-24 DOA: 12/27/2016 PCP: Patient, No Pcp Per  HPI/Recap of past 63 hours: 61 year old male with a medical history significant for hypertension, hyperlipidemia, diabetes, hypothyroidism, anxiety, morbid obesity, peripheral vascular disease, ESRD, urostomy, left BKA presents with lower back pain, foul-smelling urine, right leg wound with draining.  Today, patient's reports feeling better, denies worsening back pain, chest pain, abdominal pain, nausea/vomiting, diarrhea, fever/chills.  Assessment/Plan: Principal Problem:   Lower back pain Active Problems:   Type 2 diabetes mellitus with diabetic nephropathy, with long-term current use of insulin (HCC)   Essential hypertension   Hyperkalemia   Hypothyroidism, acquired   Cellulitis of left leg   Hyperlipidemia   ESRD on dialysis [facility])   UTI (urinary tract infection)   Idiopathic chronic venous hypertension of right lower extremity with inflammation  *Chronic low back pain Improved CT lumbar spine showed chronic L5 vertebral body compression fracture Pain management PT/OT Case management social worker for placement  #Complicated UTI Status post urostomy, 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 coli sensitive to ceftriaxone Continue Rocephin  #Cellulitis of the left BKA stump Afebrile, no leukocytosis CRP elevated, trending downwards Lower extremity Doppler, negative Consider MRI to rule out osteomyelitis Continue IV vancomycin Wound care consult  #Right leg lymphedema Also on consult, recommend Unna wrap  #Type 2 diabetes mellitus Continue insulin regimen, SSI  #Hypertension Not on any meds Monitor  #ESRD Nephrology on board, dialysis T/T/S  #Hypothyroidism Continue home Synthroid     Code Status: Full  Family Communication: None at bedside  Disposition Plan: Possible  SNF   Consultants:  Nephrology  Orthopedics  Procedures:  None  Antimicrobials:  Vancomycin  Rocephin  DVT prophylaxis:  Heparin   Objective: Vitals:   01/01/17 0652 01/01/17 0738 01/01/17 1442 01/01/17 1742  BP: 122/61 113/64 124/65 110/60  Pulse: 71 73 75 71  Resp: 18 16 18 18   Temp: 98.6 F (37 C) 98.3 F (36.8 C) 98.7 F (37.1 C) 98.2 F (36.8 C)  TempSrc: Axillary Oral Oral Oral  SpO2: 100% 100% 95% 98%  Weight:      Height:        Intake/Output Summary (Last 24 hours) at 01/01/2017 1946 Last data filed at 01/01/2017 1853 Gross per 24 hour  Intake 800 ml  Output 100 ml  Net 700 ml   Filed Weights   12/30/16 1739 12/30/16 2059 12/31/16 2107  Weight: 121.5 kg (267 lb 13.7 oz) 122.8 kg (270 lb 12.8 oz) 122.9 kg (271 lb)    Exam:   General: Awake alert, oriented x3  Cardiovascular: S1-S2 present, no added heart sounds  Respiratory: Clear to auscultation bilaterally  Abdomen: Obese, soft, nontender.  Urostomy bag on the right side of the abdomen with frothy urine  Musculoskeletal: Left BKA with stump ulcer drainage.  Right leg lymphedema  Skin: Normal  Psychiatry: Normal mood   Data Reviewed: CBC: Recent Labs  Lab 12/27/16 1600 12/30/16 0404 12/31/16 0326 01/01/17 0440  WBC 6.9 5.4 4.8 4.9  NEUTROABS 5.6 3.7 3.2 3.4  HGB 8.6* 8.1* 8.1* 7.9*  HCT 28.8* 26.9* 27.1* 26.6*  MCV 96.3 96.1 96.4 97.1  PLT 136* 122* 110* 956*   Basic Metabolic Panel: Recent Labs  Lab 12/27/16 1600 12/30/16 0404 12/31/16 0326  NA 134* 134* 135  K 5.4* 3.8 4.1  CL 100* 97* 99*  CO2 22 24 25   GLUCOSE 128* 132* 123*  BUN 74* 59* 41*  CREATININE 7.06* 6.31* 5.48*  CALCIUM 8.1* 7.7* 7.7*   GFR: Estimated Creatinine Clearance: 19.5 mL/min (A) (by C-G formula based on SCr of 5.48 mg/dL (H)). Liver Function Tests: Recent Labs  Lab 12/27/16 1600  AST 9*  ALT 9*  ALKPHOS 111  BILITOT 0.8  PROT 6.4*  ALBUMIN 2.5*   No results for input(s):  LIPASE, AMYLASE in the last 168 hours. No results for input(s): AMMONIA in the last 168 hours. Coagulation Profile: No results for input(s): INR, PROTIME in the last 168 hours. Cardiac Enzymes: No results for input(s): CKTOTAL, CKMB, CKMBINDEX, TROPONINI in the last 168 hours. BNP (last 3 results) No results for input(s): PROBNP in the last 8760 hours. HbA1C: No results for input(s): HGBA1C in the last 72 hours. CBG: Recent Labs  Lab 12/31/16 1709 12/31/16 2056 01/01/17 0742 01/01/17 1152 01/01/17 1644  GLUCAP 164* 141* 120* 157* 132*   Lipid Profile: No results for input(s): CHOL, HDL, LDLCALC, TRIG, CHOLHDL, LDLDIRECT in the last 72 hours. Thyroid Function Tests: No results for input(s): TSH, T4TOTAL, FREET4, T3FREE, THYROIDAB in the last 72 hours. Anemia Panel: Recent Labs    12/31/16 0326  FERRITIN 518*  TIBC 178*  IRON 34*   Urine analysis:    Component Value Date/Time   COLORURINE AMBER (A) 12/27/2016 1601   APPEARANCEUR TURBID (A) 12/27/2016 1601   LABSPEC >1.046 (H) 12/27/2016 1601   PHURINE 5.0 12/27/2016 1601   GLUCOSEU NEGATIVE 12/27/2016 1601   HGBUR NEGATIVE 12/27/2016 1601   BILIRUBINUR NEGATIVE 12/27/2016 1601   BILIRUBINUR neg 08/10/2014 1637   KETONESUR NEGATIVE 12/27/2016 1601   PROTEINUR NEGATIVE 12/27/2016 1601   UROBILINOGEN 1.0 08/10/2014 1637   UROBILINOGEN 0.2 08/03/2014 1225   NITRITE NEGATIVE 12/27/2016 1601   LEUKOCYTESUR TRACE (A) 12/27/2016 1601   Sepsis Labs: @LABRCNTIP (procalcitonin:4,lacticidven:4)  ) Recent Results (from the past 240 hour(s))  Urine culture     Status: Abnormal   Collection Time: 12/27/16  4:01 PM  Result Value Ref Range Status   Specimen Description URINE, RANDOM  Final   Special Requests NONE  Final   Culture >=100,000 COLONIES/mL ESCHERICHIA COLI (A)  Final   Report Status 12/29/2016 FINAL  Final   Organism ID, Bacteria ESCHERICHIA COLI (A)  Final      Susceptibility   Escherichia coli - MIC*     AMPICILLIN >=32 RESISTANT Resistant     CEFAZOLIN <=4 SENSITIVE Sensitive     CEFTRIAXONE <=1 SENSITIVE Sensitive     CIPROFLOXACIN >=4 RESISTANT Resistant     GENTAMICIN >=16 RESISTANT Resistant     IMIPENEM <=0.25 SENSITIVE Sensitive     NITROFURANTOIN <=16 SENSITIVE Sensitive     TRIMETH/SULFA >=320 RESISTANT Resistant     AMPICILLIN/SULBACTAM >=32 RESISTANT Resistant     PIP/TAZO <=4 SENSITIVE Sensitive     Extended ESBL NEGATIVE Sensitive     * >=100,000 COLONIES/mL ESCHERICHIA COLI  Culture, blood (Routine X 2) w Reflex to ID Panel     Status: None (Preliminary result)   Collection Time: 12/28/16  3:05 AM  Result Value Ref Range Status   Specimen Description BLOOD LEFT ARM  Final   Special Requests IN PEDIATRIC BOTTLE Blood Culture adequate volume  Final   Culture NO GROWTH 4 DAYS  Final   Report Status PENDING  Incomplete  MRSA PCR Screening     Status: Abnormal   Collection Time: 12/28/16  7:04 AM  Result Value Ref Range Status   MRSA by PCR POSITIVE (A) NEGATIVE Final  Comment:        The GeneXpert MRSA Assay (FDA approved for NASAL specimens only), is one component of a comprehensive MRSA colonization surveillance program. It is not intended to diagnose MRSA infection nor to guide or monitor treatment for MRSA infections. RESULT CALLED TO, READ BACK BY AND VERIFIED WITH: Gordy Clement RN 9:45 12/28/16 ([email or web address])       Studies: No results found.  Scheduled Meds: . atorvastatin  40 mg Oral q1800  . Chlorhexidine Gluconate Cloth  6 each Topical Q0600  . feeding supplement (PRO-STAT SUGAR FREE 64)  30 mL Oral BID  . ferrous sulfate  325 mg Oral BID  . heparin  5,000 Units Subcutaneous Q8H  . insulin aspart  0-9 Units Subcutaneous TID WC  . insulin glargine  28 Units Subcutaneous QHS  . levothyroxine  225 mcg Oral QAC breakfast  . midodrine  10 mg Oral Daily  . multivitamin with minerals  1 tablet Oral Daily  . mupirocin ointment  1 application Nasal BID  .  omega-3 acid ethyl esters  1 g Oral BID  . sevelamer carbonate  800 mg Oral TID WC  . vitamin C  500 mg Oral BID    Continuous Infusions: . cefTRIAXone (ROCEPHIN)  IV Stopped (01/01/17 1853)  . vancomycin Stopped (12/28/16 2031)     LOS: 5 days     Alma Friendly, MD [facility]   If 7PM-7AM, please contact night-coverage [email or web address] Password [facility] 01/01/2017, 7:46 PM

## 2017-01-01 NOTE — Plan of Care (Signed)
  Progressing Pain Managment: General experience of comfort will improve 01/01/2017 1209 - Progressing by Marice Potter, RN Skin Integrity: Risk for impaired skin integrity will decrease 01/01/2017 1209 - Progressing by Marice Potter, RN   Completed/Met Bowel/Gastric: Will not experience complications related to bowel motility 01/01/2017 1209 - Completed/Met by Marice Potter, RN

## 2017-01-01 NOTE — Progress Notes (Signed)
Physical Therapy Treatment Patient Details Name: [name] MRN: [id number] DOB: [birth year] Today's Date: 01/01/2017    History of Present Illness 61 y.o. male with medical history significant of hypertension, hyperlipidemia, diabetes mellitus, hypothyroidism, anxiety, OSA not on CPAP, morbid obesity, PVD, DVT, BPH, iron deficiency anemia, ESRD-HD (TTS), s/p of urostomy, s/p of left BKA, who presents with lower back pain, foul smelling in urine, right leg wound and draining.    PT Comments    Patient very emotional and labile during session. Max encouragement to direct to task with some inconsistent effort at times. Patient noted to have some redness and reports soreness on his buttocks. Recommend an air overlay at this time. Continue to feel patient will need placement upon discharge.   Follow Up Recommendations  SNF;Supervision/Assistance - 24 hour     Equipment Recommendations  Product manager Lift if patient returns home)    Recommendations for Other Services       Precautions / Restrictions Precautions Precautions: Fall;Other (comment)(BLE wounds; urostomy bag) Restrictions Weight Bearing Restrictions: No Other Position/Activity Restrictions: none noted in chart    Mobility  Bed Mobility Overal bed mobility: Needs Assistance Bed Mobility: Supine to Sit;Sit to Supine     Supine to sit: Max assist;+2 for physical assistance;HOB elevated Sit to supine: Max assist;+2 for physical assistance;HOB elevated   General bed mobility comments: Significant physical assist to come to EOB, Max cues for patient assist and safety. Patient with inconstant effort  Transfers                 General transfer comment: unable to perform  Ambulation/Gait                 Stairs            Wheelchair Mobility    Modified Rankin (Stroke Patients Only)       Balance Overall balance assessment: Needs assistance Sitting-balance support: Single extremity supported;Feet  unsupported Sitting balance-Leahy Scale: Fair                                      Cognition Arousal/Alertness: Awake/alert Behavior During Therapy: Anxious Overall Cognitive Status: No family/caregiver present to determine baseline cognitive functioning                                 General Comments: impaired judgement; problem solving; awareness of deficits      Exercises Other Exercises Other Exercises: Modified LAQs RLE x8 Other Exercises: Left knee extension x8 Other Exercises: Modified marching bilaterally x5    General Comments        Pertinent Vitals/Pain Pain Assessment: Faces Faces Pain Scale: Hurts little more Pain Location: R shoulder with movement Pain Descriptors / Indicators: Aching;Discomfort;Sore Pain Intervention(s): Limited activity within patient's tolerance;Monitored during session    Home Living                      Prior Function            PT Goals (current goals can now be found in the care plan section) Acute Rehab PT Goals Patient Stated Goal: to get stronger PT Goal Formulation: With patient Time For Goal Achievement: 01/11/17 Potential to Achieve Goals: Fair Progress towards PT goals: Progressing toward goals    Frequency    Min 2X/week  PT Plan      Co-evaluation              AM-PAC PT "6 Clicks" Daily Activity  Outcome Measure  Difficulty turning over in bed (including adjusting bedclothes, sheets and blankets)?: Unable Difficulty moving from lying on back to sitting on the side of the bed? : Unable Difficulty sitting down on and standing up from a chair with arms (e.g., wheelchair, bedside commode, etc,.)?: Unable Help needed moving to and from a bed to chair (including a wheelchair)?: Total Help needed walking in hospital room?: Total Help needed climbing 3-5 steps with a railing? : Total 6 Click Score: 6    End of Session   Activity Tolerance: Patient limited by  fatigue;Patient limited by pain Patient left: in bed;with call bell/phone within reach(OT present) Nurse Communication: Mobility status;Need for lift equipment PT Visit Diagnosis: Pain;Muscle weakness (generalized) (M62.81);Difficulty in walking, not elsewhere classified (R26.2) Pain - Right/Left: Right Pain - part of body: Shoulder     Time: 1359-1420 PT Time Calculation (min) (ACUTE ONLY): 21 min  Charges:  $Therapeutic Activity: 8-22 mins                    G Codes:       [name], PT DPT  Board Certified Neurologic Specialist [phone]    [name] 01/01/2017, 4:16 PM

## 2017-01-01 NOTE — Progress Notes (Signed)
[facility] Progress Note   Subjective:  Tired today, says he was up half the night. Denies CP or dyspnea. Still with drainage from R foot and L stump.   Objective Vitals:   12/31/16 1700 12/31/16 2107 01/01/17 0652 01/01/17 0738  BP: 127/64 116/65 122/61 113/64  Pulse: 69 72 71 73  Resp: 18 17 18 16   Temp: 98.4 F (36.9 C) 98.6 F (37 C) 98.6 F (37 C) 98.3 F (36.8 C)  TempSrc: Oral  Axillary Oral  SpO2: 99% 100% 100% 100%  Weight:  122.9 kg (271 lb)    Height:       Physical Exam General: Obese male, NAD. Heart: RRR; no murmur Lungs: CTA anteriorly Abdomen: soft, non-tender, urostomy with green fluid  Extremities: RLE bandaged, yellow drainage from toes.  L stump edema with hyperpigmentation changes and drainage present. Dialysis Access: RUE AVF + bruit  Additional Objective Labs: Basic Metabolic Panel: Recent Labs  Lab 12/27/16 1600 12/30/16 0404 12/31/16 0326  NA 134* 134* 135  K 5.4* 3.8 4.1  CL 100* 97* 99*  CO2 22 24 25   GLUCOSE 128* 132* 123*  BUN 74* 59* 41*  CREATININE 7.06* 6.31* 5.48*  CALCIUM 8.1* 7.7* 7.7*   Liver Function Tests: Recent Labs  Lab 12/27/16 1600  AST 9*  ALT 9*  ALKPHOS 111  BILITOT 0.8  PROT 6.4*  ALBUMIN 2.5*   CBC: Recent Labs  Lab 12/27/16 1600 12/30/16 0404 12/31/16 0326 01/01/17 0440  WBC 6.9 5.4 4.8 4.9  NEUTROABS 5.6 3.7 3.2 3.4  HGB 8.6* 8.1* 8.1* 7.9*  HCT 28.8* 26.9* 27.1* 26.6*  MCV 96.3 96.1 96.4 97.1  PLT 136* 122* 110* 104*   CBG: Recent Labs  Lab 12/31/16 1007 12/31/16 1233 12/31/16 1709 12/31/16 2056 01/01/17 0742  GLUCAP 179* 228* 164* 141* 120*   Iron Studies:  Recent Labs    12/31/16 0326  IRON 34*  TIBC 178*  FERRITIN 518*   Medications: . cefTRIAXone (ROCEPHIN)  IV Stopped (12/31/16 1948)  . vancomycin Stopped (12/28/16 2031)   . atorvastatin  40 mg Oral q1800  . Chlorhexidine Gluconate Cloth  6 each Topical Q0600  . feeding supplement (PRO-STAT SUGAR  FREE 64)  30 mL Oral BID  . ferrous sulfate  325 mg Oral BID  . heparin  5,000 Units Subcutaneous Q8H  . insulin aspart  0-9 Units Subcutaneous TID WC  . insulin glargine  28 Units Subcutaneous QHS  . levothyroxine  225 mcg Oral QAC breakfast  . midodrine  10 mg Oral Daily  . multivitamin with minerals  1 tablet Oral Daily  . mupirocin ointment  1 application Nasal BID  . omega-3 acid ethyl esters  1 g Oral BID  . sevelamer carbonate  800 mg Oral TID WC  . vitamin C  500 mg Oral BID    Dialysis Orders: [facility] on [address]  TTS 4h 56min   2/2.5   117.5kg   RUE AVF   Hep none Mircera 200 q 2 wks (last 10/25) No VDRA   Assessment: 1. Low Back Pain: Chronic L5 compression fracture, pain improved. Per primary. 2. UTI (chronic urostomy):  UCx + E. Coli on Ceftriaxone.   3. R Leg wound/L BKA stump wound : Unna wrap per ortho. IV Vanc has been added. 4. ESRD: Will continue HD per TTS schedule, next 11/6. No heparin. 5. HypoTN/volume: BP ok, on midodrine. Over EDW by ~5kg, will try to get back down. 6. Anemia:  Hgb trending down, due for ESA 11/8. Follow. On PO iron BID. 7. Secondary hyperparathyroidism: Ca ok, Phos pending. Continue Renvela as binder. 8. Nutrition: Alb very low. Continue pro-stat BID. 9. DM: On insulin, per primary. 10. Dispo: per primary, likely to SNF.     Veneta Penton, PA-C 01/01/2017, 9:09 AM  [facility] Pager: [phone]

## 2017-01-02 ENCOUNTER — Inpatient Hospital Stay (HOSPITAL_COMMUNITY): Payer: Medicare Other

## 2017-01-02 LAB — RENAL FUNCTION PANEL
ANION GAP: 14 (ref 5–15)
Albumin: 2.5 g/dL — ABNORMAL LOW (ref 3.5–5.0)
BUN: 77 mg/dL — ABNORMAL HIGH (ref 6–20)
CALCIUM: 8 mg/dL — AB (ref 8.9–10.3)
CO2: 22 mmol/L (ref 22–32)
CREATININE: 8.03 mg/dL — AB (ref 0.61–1.24)
Chloride: 102 mmol/L (ref 101–111)
GFR, EST AFRICAN AMERICAN: 8 mL/min — AB (ref >60)
GFR, EST NON AFRICAN AMERICAN: 6 mL/min — AB (ref >60)
Glucose, Bld: 125 mg/dL — ABNORMAL HIGH (ref 65–99)
PHOSPHORUS: 3.5 mg/dL (ref 2.5–4.6)
Potassium: 5.3 mmol/L — ABNORMAL HIGH (ref 3.5–5.1)
SODIUM: 138 mmol/L (ref 135–145)

## 2017-01-02 LAB — CULTURE, BLOOD (ROUTINE X 2)
CULTURE: NO GROWTH
SPECIAL REQUESTS: ADEQUATE

## 2017-01-02 LAB — CBC
HCT: 26 % — ABNORMAL LOW (ref 39.0–52.0)
Hemoglobin: 7.8 g/dL — ABNORMAL LOW (ref 13.0–17.0)
MCH: 29.1 pg (ref 26.0–34.0)
MCHC: 30 g/dL (ref 30.0–36.0)
MCV: 97 fL (ref 78.0–100.0)
PLATELETS: 116 10*3/uL — AB (ref 150–400)
RBC: 2.68 MIL/uL — AB (ref 4.22–5.81)
RDW: 16.3 % — ABNORMAL HIGH (ref 11.5–15.5)
WBC: 5.1 10*3/uL (ref 4.0–10.5)

## 2017-01-02 LAB — GLUCOSE, CAPILLARY
GLUCOSE-CAPILLARY: 168 mg/dL — AB (ref 65–99)
GLUCOSE-CAPILLARY: 194 mg/dL — AB (ref 65–99)
GLUCOSE-CAPILLARY: 144 mg/dL — AB (ref 65–99)

## 2017-01-02 MED ORDER — VANCOMYCIN HCL IN DEXTROSE 1-5 GM/200ML-% IV SOLN
INTRAVENOUS | Status: AC
  Filled 2017-01-02: qty 200

## 2017-01-02 MED ORDER — MIDODRINE HCL 5 MG PO TABS
ORAL_TABLET | ORAL | Status: AC
  Filled 2017-01-02: qty 2

## 2017-01-02 NOTE — Progress Notes (Signed)
PROGRESS NOTE  [name] SLH:[id number] DOB: 1955-05-14 DOA: 12/27/2016 PCP: Patient, No Pcp Per  HPI/Recap of past 39 hours: 61 year old male with a medical history significant for hypertension, hyperlipidemia, diabetes, hypothyroidism, anxiety, morbid obesity, peripheral vascular disease, ESRD, urostomy, left BKA presents with lower back pain, foul-smelling urine, right leg wound with draining.  Today, patient's reports feeling better, denies worsening back pain, chest pain, abdominal pain, nausea/vomiting, diarrhea, fever/chills.  Assessment/Plan: Principal Problem:   Lower back pain Active Problems:   Type 2 diabetes mellitus with diabetic nephropathy, with long-term current use of insulin (HCC)   Essential hypertension   Hyperkalemia   Hypothyroidism, acquired   Cellulitis of left leg   Hyperlipidemia   ESRD on dialysis [facility])   UTI (urinary tract infection)   Idiopathic chronic venous hypertension of right lower extremity with inflammation  *Chronic low back pain Improved CT lumbar spine showed chronic L5 vertebral body compression fracture Pain management PT/OT Case management social worker for placement  #Complicated UTI Status post urostomy, 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 coli sensitive to ceftriaxone Continue Rocephin  #Cellulitis of the left BKA stump Afebrile, no leukocytosis CRP elevated, trending downwards Lower extremity Doppler, negative MRI of L stump to rule out osteomyelitis as wound still draining Consider consulting ID if pt has osteomyelitis Continue IV vancomycin Wound care consult  #Right leg lymphedema Ortho consulted, recommended Unna wrap  #Type 2 diabetes mellitus Continue insulin regimen, SSI  #Hypertension Not on any meds Monitor  #ESRD Nephrology on board, dialysis T/T/S  #Hypothyroidism Continue home Synthroid     Code Status: Full  Family Communication: None at  bedside  Disposition Plan: Possible SNF, SW/CM on board   Consultants:  Nephrology  Orthopedics  Procedures:  None  Antimicrobials:  Vancomycin  Rocephin  DVT prophylaxis:  Heparin   Objective: Vitals:   01/02/17 1000 01/02/17 1030 01/02/17 1100 01/02/17 1126  BP: 97/63 104/62 110/65 125/67  Pulse: 76 74 78 78  Resp: 17 18 17 16   Temp:    (!) 97 F (36.1 C)  TempSrc:    Oral  SpO2:    98%  Weight:      Height:        Intake/Output Summary (Last 24 hours) at 01/02/2017 1258 Last data filed at 01/02/2017 1126 Gross per 24 hour  Intake 440 ml  Output 5110 ml  Net -4670 ml   Filed Weights   12/31/16 2107 01/01/17 2151 01/02/17 0705  Weight: 122.9 kg (271 lb) 122.9 kg (271 lb) 122.1 kg (269 lb 2.9 oz)    Exam:   General: Awake alert, oriented x3  Cardiovascular: S1-S2 present, no added heart sounds  Respiratory: Clear to auscultation bilaterally  Abdomen: Obese, soft, nontender.  Urostomy bag on the right side of the abdomen with frothy urine  Musculoskeletal: Left BKA with stump ulcer drainage.  Right leg lymphedema  Skin: Normal  Psychiatry: Normal mood   Data Reviewed: CBC: Recent Labs  Lab 12/27/16 1600 12/30/16 0404 12/31/16 0326 01/01/17 0440 01/02/17 0719  WBC 6.9 5.4 4.8 4.9 5.1  NEUTROABS 5.6 3.7 3.2 3.4  --   HGB 8.6* 8.1* 8.1* 7.9* 7.8*  HCT 28.8* 26.9* 27.1* 26.6* 26.0*  MCV 96.3 96.1 96.4 97.1 97.0  PLT 136* 122* 110* 104* 157*   Basic Metabolic Panel: Recent Labs  Lab 12/27/16 1600 12/30/16 0404 12/31/16 0326 01/02/17 0719  NA 134* 134* 135 138  K 5.4* 3.8 4.1 5.3*  CL  100* 97* 99* 102  CO2 22 24 25 22   GLUCOSE 128* 132* 123* 125*  BUN 74* 59* 41* 77*  CREATININE 7.06* 6.31* 5.48* 8.03*  CALCIUM 8.1* 7.7* 7.7* 8.0*  PHOS  --   --   --  3.5   GFR: Estimated Creatinine Clearance: 13.3 mL/min (A) (by C-G formula based on SCr of 8.03 mg/dL (H)). Liver Function Tests: Recent Labs  Lab 12/27/16 1600  01/02/17 0719  AST 9*  --   ALT 9*  --   ALKPHOS 111  --   BILITOT 0.8  --   PROT 6.4*  --   ALBUMIN 2.5* 2.5*   No results for input(s): LIPASE, AMYLASE in the last 168 hours. No results for input(s): AMMONIA in the last 168 hours. Coagulation Profile: No results for input(s): INR, PROTIME in the last 168 hours. Cardiac Enzymes: No results for input(s): CKTOTAL, CKMB, CKMBINDEX, TROPONINI in the last 168 hours. BNP (last 3 results) No results for input(s): PROBNP in the last 8760 hours. HbA1C: No results for input(s): HGBA1C in the last 72 hours. CBG: Recent Labs  Lab 12/31/16 2056 01/01/17 0742 01/01/17 1152 01/01/17 1644 01/01/17 2147  GLUCAP 141* 120* 157* 132* 118*   Lipid Profile: No results for input(s): CHOL, HDL, LDLCALC, TRIG, CHOLHDL, LDLDIRECT in the last 72 hours. Thyroid Function Tests: No results for input(s): TSH, T4TOTAL, FREET4, T3FREE, THYROIDAB in the last 72 hours. Anemia Panel: Recent Labs    12/31/16 0326  FERRITIN 518*  TIBC 178*  IRON 34*   Urine analysis:    Component Value Date/Time   COLORURINE AMBER (A) 12/27/2016 1601   APPEARANCEUR TURBID (A) 12/27/2016 1601   LABSPEC >1.046 (H) 12/27/2016 1601   PHURINE 5.0 12/27/2016 1601   GLUCOSEU NEGATIVE 12/27/2016 1601   HGBUR NEGATIVE 12/27/2016 1601   BILIRUBINUR NEGATIVE 12/27/2016 1601   BILIRUBINUR neg 08/10/2014 1637   KETONESUR NEGATIVE 12/27/2016 1601   PROTEINUR NEGATIVE 12/27/2016 1601   UROBILINOGEN 1.0 08/10/2014 1637   UROBILINOGEN 0.2 08/03/2014 1225   NITRITE NEGATIVE 12/27/2016 1601   LEUKOCYTESUR TRACE (A) 12/27/2016 1601   Sepsis Labs: @LABRCNTIP (procalcitonin:4,lacticidven:4)  ) Recent Results (from the past 240 hour(s))  Urine culture     Status: Abnormal   Collection Time: 12/27/16  4:01 PM  Result Value Ref Range Status   Specimen Description URINE, RANDOM  Final   Special Requests NONE  Final   Culture >=100,000 COLONIES/mL ESCHERICHIA COLI (A)  Final    Report Status 12/29/2016 FINAL  Final   Organism ID, Bacteria ESCHERICHIA COLI (A)  Final      Susceptibility   Escherichia coli - MIC*    AMPICILLIN >=32 RESISTANT Resistant     CEFAZOLIN <=4 SENSITIVE Sensitive     CEFTRIAXONE <=1 SENSITIVE Sensitive     CIPROFLOXACIN >=4 RESISTANT Resistant     GENTAMICIN >=16 RESISTANT Resistant     IMIPENEM <=0.25 SENSITIVE Sensitive     NITROFURANTOIN <=16 SENSITIVE Sensitive     TRIMETH/SULFA >=320 RESISTANT Resistant     AMPICILLIN/SULBACTAM >=32 RESISTANT Resistant     PIP/TAZO <=4 SENSITIVE Sensitive     Extended ESBL NEGATIVE Sensitive     * >=100,000 COLONIES/mL ESCHERICHIA COLI  Culture, blood (Routine X 2) w Reflex to ID Panel     Status: None (Preliminary result)   Collection Time: 12/28/16  3:05 AM  Result Value Ref Range Status   Specimen Description BLOOD LEFT ARM  Final   Special Requests IN PEDIATRIC BOTTLE Blood  Culture adequate volume  Final   Culture NO GROWTH 4 DAYS  Final   Report Status PENDING  Incomplete  MRSA PCR Screening     Status: Abnormal   Collection Time: 12/28/16  7:04 AM  Result Value Ref Range Status   MRSA by PCR POSITIVE (A) NEGATIVE Final    Comment:        The GeneXpert MRSA Assay (FDA approved for NASAL specimens only), is one component of a comprehensive MRSA colonization surveillance program. It is not intended to diagnose MRSA infection nor to guide or monitor treatment for MRSA infections. RESULT CALLED TO, READ BACK BY AND VERIFIED WITH: Gordy Clement RN 9:45 12/28/16 ([email or web address])       Studies: No results found.  Scheduled Meds: . atorvastatin  40 mg Oral q1800  . Chlorhexidine Gluconate Cloth  6 each Topical Q0600  . feeding supplement (PRO-STAT SUGAR FREE 64)  30 mL Oral BID  . ferrous sulfate  325 mg Oral BID  . heparin  5,000 Units Subcutaneous Q8H  . insulin aspart  0-9 Units Subcutaneous TID WC  . insulin glargine  28 Units Subcutaneous QHS  . levothyroxine  225 mcg Oral QAC  breakfast  . midodrine  10 mg Oral Daily  . multivitamin with minerals  1 tablet Oral Daily  . mupirocin ointment  1 application Nasal BID  . omega-3 acid ethyl esters  1 g Oral BID  . sevelamer carbonate  800 mg Oral TID WC  . vitamin C  500 mg Oral BID    Continuous Infusions: . cefTRIAXone (ROCEPHIN)  IV Stopped (01/01/17 1853)  . vancomycin 1,000 mg (01/02/17 1028)     LOS: 6 days     Alma Friendly, MD [facility]   If 7PM-7AM, please contact night-coverage [email or web address] Password [email or web address] 01/02/2017, 12:58 PM

## 2017-01-02 NOTE — Clinical Social Work Note (Signed)
Clinical Social Work Assessment  Patient Details  Name: [name] MRN: [id number] Date of Birth: 1955/06/14  Date of referral:  01/02/17               Reason for consult:  Facility Placement, Discharge Planning                Permission sought to share information with:  Facility Sport and exercise psychologist, Family Supports Permission granted to share information::  Yes, Verbal Permission Granted  Name::     Midwife::  SNF  Relationship::  wife  Contact Information:     Housing/Transportation Living arrangements for the past 2 months:  [place] of Information:  Patient Patient Interpreter Needed:  None Criminal Activity/Legal Involvement Pertinent to Current Situation/Hospitalization:  No - Comment as needed Significant Relationships:  Spouse Lives with:  Spouse Do you feel safe going back to the place where you live?  No Need for family participation in patient care:  No (Coment)  Care giving concerns:  Pt lives at home with wife- wife works 45-80 hours a week per patient and there is no other family in this area that can assist with patient care.   Social Worker assessment / plan:  CSW met with pt to discuss plan for time of DC.  Patient alert and oriented but very anxious during interview sobbing sporadically.  Patient reports he was recently discharged from [facility] where patient was for rehab.  Patient reports he had been there 100 days and decided to leave when they informed him his 100 days are up and he would have to hand over his SSI check to continue care.  Patient then discharged home 10/31(?) where it was very difficult to care for himself.  CSW confirmed with [facility] that patient has used all 100 medicare days.  Patient states he cannot afford to pay privately for SNF, states he has applied for Medicaid in the past but was denied due to his wife's income.  Patient not wanting to go to SNF for long term care wanting to go for a  few weeks of therapy then return home.  Patient states he did not get enough therapy at [facility] though states part of this is because he had weight baring restrictions due to hip fractures.    Employment status:    Insurance information:  Medicare PT Recommendations:  [place]am[place] / Referral to community resources:  [place]  Patient/Family's Response to care:  Pt wanting SNF placement but acknowledges he can't pay for it privately- feels as if he went home he would return to the hospital almost immediately.  Patient/Family's Understanding of and Emotional Response to Diagnosis, Current Treatment, and Prognosis:  Patient overwhelmed by what to do at this time feeling as if he cannot return home in current state but not sure what else to do.  Emotional Assessment Appearance:  Appears older than stated age Attitude/Demeanor/Rapport:  Crying Affect (typically observed):  Anxious, Appropriate Orientation:  Oriented to Self, Oriented to Place, Oriented to  Time, Oriented to Situation Alcohol / Substance use:  Not Applicable Psych involvement (Current and /or in the community):  No (Comment)  Discharge Needs  Concerns to be addressed:  Care Coordination Readmission within the last 30 days:  No Current discharge risk:  Physical Impairment Barriers to Discharge:  Continued Medical Work up, Inadequate or no insurance   Bebe Liter, [place] H, LCSW 01/02/2017, 3:26 PM

## 2017-01-02 NOTE — Procedures (Signed)
Patient seen on Hemodialysis. QB 400, UF goal 5.5L Treatment adjusted as needed.  Elmarie Shiley MD [facility] Office # [phone] Pager # [phone] 8:41 AM

## 2017-01-02 NOTE — Progress Notes (Signed)
OT Cancellation Note  Patient Details Name: [name] MRN: [id number] DOB: 1955-11-17   Cancelled Treatment:    Reason Eval/Treat Not Completed: Patient at procedure or test/ unavailable. Pt off unit at HD. Will check back as able.   Norman Herrlich, MS OTR/L  Pager: [phone]   Norman Herrlich 01/02/2017, 7:33 AM

## 2017-01-02 NOTE — Progress Notes (Signed)
Patient ID: [name], male   DOB: 1955-10-15, 61 y.o.   MRN: [id number]  [facility] Progress Note   Assessment/ Plan:   1. Low Back Pain: Chronic L5 compression fracture, improving pain control noted at this time. 2. UTI (chronic urostomy): UCx + E. Coli on Ceftriaxone- next dose this evening.  3. R Leg wound/L BKA stump wound :Unna wrap per ortho.ON intravenous vancomycin. 4.ESRD: Will continue HD per TTS schedule, No heparin. Will re-evaluate EDW /UF goals closely.  5.Hypotension/volume: acceptable blood pressures on midodrine- monitor with UF. 6. Anemia: Hgb trending down, due for ESA 11/8. Follow. On PO iron BID. 7. Secondary hyperparathyroidism:Ca ok, Phos pending. Continue Renvela as binder. 8. Nutrition: Alb very low. Continue pro-stat BID. 9. DM: On insulin, per primary. 10. Dispo: per primary, likely to SNF.  Subjective:   Reports some back pain while on dialysis.    Objective:   BP 118/66   Pulse 73   Temp 98.5 F (36.9 C) (Oral)   Resp 14   Ht 5' 11.5" (1.816 m)   Wt 122.1 kg (269 lb 2.9 oz)   SpO2 95%   BMI 37.02 kg/m   Physical Exam: TLX:BWIOMBT somewhat uncomfortable on dialysis DHR:CBULA regular rhythm and normal rate, normal s1 and s2 Resp:decreased breath sounds over bases- no rales/rhonchi GTX:MIWO, obese, NT EHO:ZYYQM leg in Unna wrap and left AKA stump dusky  Labs: BMET Recent Labs  Lab 12/27/16 1600 12/30/16 0404 12/31/16 0326 01/02/17 0719  NA 134* 134* 135 138  K 5.4* 3.8 4.1 5.3*  CL 100* 97* 99* 102  CO2 22 24 25 22   GLUCOSE 128* 132* 123* 125*  BUN 74* 59* 41* 77*  CREATININE 7.06* 6.31* 5.48* 8.03*  CALCIUM 8.1* 7.7* 7.7* 8.0*  PHOS  --   --   --  3.5   CBC Recent Labs  Lab 12/27/16 1600 12/30/16 0404 12/31/16 0326 01/01/17 0440 01/02/17 0719  WBC 6.9 5.4 4.8 4.9 5.1  NEUTROABS 5.6 3.7 3.2 3.4  --   HGB 8.6* 8.1* 8.1* 7.9* 7.8*  HCT 28.8* 26.9* 27.1* 26.6* 26.0*  MCV 96.3 96.1 96.4 97.1 97.0   PLT 136* 122* 110* 104* PENDING   Medications:    . atorvastatin  40 mg Oral q1800  . Chlorhexidine Gluconate Cloth  6 each Topical Q0600  . feeding supplement (PRO-STAT SUGAR FREE 64)  30 mL Oral BID  . ferrous sulfate  325 mg Oral BID  . heparin  5,000 Units Subcutaneous Q8H  . insulin aspart  0-9 Units Subcutaneous TID WC  . insulin glargine  28 Units Subcutaneous QHS  . levothyroxine  225 mcg Oral QAC breakfast  . midodrine  10 mg Oral Daily  . multivitamin with minerals  1 tablet Oral Daily  . mupirocin ointment  1 application Nasal BID  . omega-3 acid ethyl esters  1 g Oral BID  . sevelamer carbonate  800 mg Oral TID WC  . vitamin C  500 mg Oral BID   Elmarie Shiley, MD 01/02/2017, 8:31 AM

## 2017-01-02 NOTE — NC FL2 (Signed)
[place] MEDICAID FL2 LEVEL OF CARE SCREENING TOOL     IDENTIFICATION  Patient Name: [name] Birthdate: 1955/09/07 Sex: male Admission Date (Current Location): 12/27/2016  [facility] and [place] Number:  Herbalist and Address:  [facility], [place] [address] [place], [place] [address]      Provider Number: [id number]  Attending Physician Name and Address:  Alma Friendly, MD  Relative Name and Phone Number:       Current Level of Care: Hospital Recommended Level of Care: [place] Prior Approval Number:    Date Approved/Denied:   PASRR Number:   [id number]   Discharge Plan: SNF    Current Diagnoses: Patient Active Problem List   Diagnosis Date Noted  . Idiopathic chronic venous hypertension of right lower extremity with inflammation   . Lower back pain 12/27/2016  . UTI (urinary tract infection) 12/27/2016  . Non-pressure chronic ulcer of left calf, limited to breakdown of skin ([place]) 02/07/2016  . Hypotension, unspecified 11/24/2015  . Left knee pain 11/24/2015  . Hypotension 11/24/2015  . Decubitus ulcer of buttock 01/22/2015  . S/P BKA (below knee amputation) unilateral ([place]) 01/22/2015  . Chronic venous insufficiency 09/08/2014  . Diabetes mellitus, type 2 ([place]) 09/08/2014  . ESRD on dialysis ([place]) 09/08/2014  . Hyperlipidemia 12/23/2013  . Diabetic foot ([place]) 11/20/2013  . Chronic pain 05/21/2013  . Chronic interstitial cystitis 02/06/2013  . UI (urinary incontinence) 10/29/2012  . CKD (chronic kidney disease) stage 3, GFR 30-59 ml/min (HCC) 08/18/2012  . Cellulitis of left leg 08/14/2012  . Hyperkalemia 08/04/2012  . Hypothyroidism, acquired 08/04/2012  . OBESITY 09/21/2009  . Essential hypertension 09/21/2009  . DIVERTICULOSIS OF COLON 09/21/2009  . Type 2 diabetes mellitus with diabetic nephropathy, with long-term current use of insulin ([place]) 10/01/2007  . DVT 10/01/2007    Orientation  RESPIRATION BLADDER Height & Weight     Self, Time, Situation, Place  Normal Incontinent, Urostomy Weight: 269 lb 2.9 oz (122.1 kg) Height:  5' 11.5" (181.6 cm)  BEHAVIORAL SYMPTOMS/MOOD NEUROLOGICAL BOWEL NUTRITION STATUS      Continent Diet(renal diet)  AMBULATORY STATUS COMMUNICATION OF NEEDS Skin   Extensive Assist Verbally Normal                       Personal Care Assistance Level of Assistance  Bathing, Dressing Bathing Assistance: Maximum assistance   Dressing Assistance: Maximum assistance     Functional Limitations Info             SPECIAL CARE FACTORS FREQUENCY  PT (By licensed PT), OT (By licensed OT)     PT Frequency: 5/wk OT Frequency: 5/wk            Contractures      Additional Factors Info  Code Status, Allergies Code Status Info: FULL Allergies Info: NKA           Current Medications (01/02/2017):  This is the current hospital active medication list Current Facility-Administered Medications  Medication Dose Route Frequency Provider Last Rate Last Dose  . acetaminophen (TYLENOL) tablet 650 mg  650 mg Oral Q6H PRN Ivor Costa, MD   650 mg at 12/28/16 2023   Or  . acetaminophen (TYLENOL) suppository 650 mg  650 mg Rectal Q6H PRN Ivor Costa, MD      . atorvastatin (LIPITOR) tablet 40 mg  40 mg Oral q1800 Ivor Costa, MD   40 mg at 01/02/17 1335  .  cefTRIAXone (ROCEPHIN) 1 g in dextrose 5 % 50 mL IVPB  1 g Intravenous Q24H Ivor Costa, MD   Stopped at 01/01/17 1853  . Chlorhexidine Gluconate Cloth 2 % PADS 6 each  6 each Topical Q0600 Bonnell Public, MD   6 each at 12/29/16 [phone]  . feeding supplement (PRO-STAT SUGAR FREE 64) liquid 30 mL  30 mL Oral BID Loren Racer, PA-C   30 mL at 12/31/16 2156  . ferrous sulfate tablet 325 mg  325 mg Oral BID Ivor Costa, MD   325 mg at 01/02/17 1334  . heparin injection 5,000 Units  5,000 Units Subcutaneous Q8H Ivor Costa, MD   5,000 Units at 01/02/17 1335  . hydrALAZINE (APRESOLINE) injection 5 mg   5 mg Intravenous Q2H PRN Ivor Costa, MD      . insulin aspart (novoLOG) injection 0-9 Units  0-9 Units Subcutaneous TID WC Ivor Costa, MD   1 Units at 01/01/17 1746  . insulin glargine (LANTUS) injection 28 Units  28 Units Subcutaneous QHS Ivor Costa, MD   28 Units at 01/01/17 2245  . levothyroxine (SYNTHROID, LEVOTHROID) tablet 225 mcg  225 mcg Oral QAC breakfast Dana Allan I, MD   225 mcg at 01/02/17 1334  . methocarbamol (ROBAXIN) tablet 500 mg  500 mg Oral Q8H PRN Ivor Costa, MD   500 mg at 01/02/17 1334  . midodrine (PROAMATINE) tablet 10 mg  10 mg Oral Daily Lynnda Child, PA-C   10 mg at 01/02/17 0734  . multivitamin with minerals tablet 1 tablet  1 tablet Oral Daily Ivor Costa, MD   1 tablet at 01/02/17 1334  . omega-3 acid ethyl esters (LOVAZA) capsule 1 g  1 g Oral BID Ivor Costa, MD   1 g at 01/02/17 1335  . ondansetron (ZOFRAN) tablet 4 mg  4 mg Oral Q6H PRN Ivor Costa, MD   4 mg at 01/01/17 1204   Or  . ondansetron (ZOFRAN) injection 4 mg  4 mg Intravenous Q6H PRN Ivor Costa, MD   4 mg at 12/30/16 0145  . oxyCODONE-acetaminophen (PERCOCET/ROXICET) 5-325 MG per tablet 1 tablet  1 tablet Oral Q4H PRN Ivor Costa, MD   1 tablet at 01/02/17 1334  . senna-docusate (Senokot-S) tablet 1 tablet  1 tablet Oral QHS PRN Ivor Costa, MD   1 tablet at 12/31/16 2156  . sevelamer carbonate (RENVELA) tablet 800 mg  800 mg Oral TID WC Ivor Costa, MD   800 mg at 01/02/17 1333  . vancomycin (VANCOCIN) IVPB 1000 mg/200 mL premix  1,000 mg Intravenous Q T,Th,Sa-HD Ivor Costa, MD 200 mL/hr at 01/02/17 1028 1,000 mg at 01/02/17 1028  . vitamin C (ASCORBIC ACID) tablet 500 mg  500 mg Oral BID Ivor Costa, MD   500 mg at 01/02/17 1334  . zolpidem (AMBIEN) tablet 5 mg  5 mg Oral QHS PRN Ivor Costa, MD         Discharge Medications: Please see discharge summary for a list of discharge medications.  Relevant Imaging Results:  Relevant Lab Results:   Additional Information SS# [id number].   Dailysis patient TTS - [facility] [place].    Jorge Ny, LCSW

## 2017-01-03 LAB — CBC WITH DIFFERENTIAL/PLATELET
Basophils Absolute: 0 10*3/uL (ref 0.0–0.1)
Basophils Relative: 0 %
Eosinophils Absolute: 0.2 10*3/uL (ref 0.0–0.7)
Eosinophils Relative: 3 %
HEMATOCRIT: 27.3 % — AB (ref 39.0–52.0)
Hemoglobin: 8.2 g/dL — ABNORMAL LOW (ref 13.0–17.0)
LYMPHS ABS: 1.2 10*3/uL (ref 0.7–4.0)
LYMPHS PCT: 21 %
MCH: 29 pg (ref 26.0–34.0)
MCHC: 30 g/dL (ref 30.0–36.0)
MCV: 96.5 fL (ref 78.0–100.0)
MONO ABS: 0.4 10*3/uL (ref 0.1–1.0)
Monocytes Relative: 7 %
NEUTROS ABS: 4 10*3/uL (ref 1.7–7.7)
Neutrophils Relative %: 69 %
PLATELETS: 125 10*3/uL — AB (ref 150–400)
RBC: 2.83 MIL/uL — AB (ref 4.22–5.81)
RDW: 15.8 % — ABNORMAL HIGH (ref 11.5–15.5)
WBC: 5.7 10*3/uL (ref 4.0–10.5)

## 2017-01-03 LAB — GLUCOSE, CAPILLARY
GLUCOSE-CAPILLARY: 155 mg/dL — AB (ref 65–99)
GLUCOSE-CAPILLARY: 117 mg/dL — AB (ref 65–99)
Glucose-Capillary: 103 mg/dL — ABNORMAL HIGH (ref 65–99)
Glucose-Capillary: 182 mg/dL — ABNORMAL HIGH (ref 65–99)

## 2017-01-03 LAB — BASIC METABOLIC PANEL
ANION GAP: 13 (ref 5–15)
BUN: 43 mg/dL — AB (ref 6–20)
CHLORIDE: 98 mmol/L — AB (ref 101–111)
CO2: 26 mmol/L (ref 22–32)
Calcium: 8.2 mg/dL — ABNORMAL LOW (ref 8.9–10.3)
Creatinine, Ser: 5.18 mg/dL — ABNORMAL HIGH (ref 0.61–1.24)
GFR calc Af Amer: 13 mL/min — ABNORMAL LOW (ref >60)
GFR calc non Af Amer: 11 mL/min — ABNORMAL LOW (ref >60)
GLUCOSE: 125 mg/dL — AB (ref 65–99)
Potassium: 4.2 mmol/L (ref 3.5–5.1)
Sodium: 137 mmol/L (ref 135–145)

## 2017-01-03 MED ORDER — DARBEPOETIN ALFA 200 MCG/0.4ML IJ SOSY
200.0000 ug | PREFILLED_SYRINGE | INTRAMUSCULAR | Status: DC
  Administered 2017-01-04: 200 ug via INTRAVENOUS
  Filled 2017-01-03: qty 0.4

## 2017-01-03 MED ORDER — CLINDAMYCIN HCL 300 MG PO CAPS
300.0000 mg | ORAL_CAPSULE | Freq: Three times a day (TID) | ORAL | Status: DC
  Administered 2017-01-03 – 2017-01-05 (×7): 300 mg via ORAL
  Filled 2017-01-03 (×7): qty 1

## 2017-01-03 NOTE — Progress Notes (Signed)
Pharmacy Antibiotic Note  [name] is a 61 y.o. male admitted on 12/27/2016 with cellulitis.  Pharmacy has been consulted for vancomycin dosing.   Patient is ESRD on HD TTSat- tolerating dialysis sessions WBC down, afebrile. CRP trended down and LA normal  Plan: Vancomycin 1 gram iv TTS after HD - Day # 8 Ceftriaxone 1 gram iv Q 24 hours - Day # 7  What is planned length of treatment?   Height: 5' 11.5" (181.6 cm) Weight: 269 lb 2.9 oz (122.1 kg) IBW/kg (Calculated) : 76.45  Temp (24hrs), Avg:97.9 F (36.6 C), Min:97 F (36.1 C), Max:98.5 F (36.9 C)  Recent Labs  Lab 12/27/16 1600 12/27/16 1607 12/30/16 0404 12/31/16 0326 01/01/17 0440 01/02/17 0719 01/03/17 0320  WBC 6.9  --  5.4 4.8 4.9 5.1 5.7  CREATININE 7.06*  --  6.31* 5.48*  --  8.03* 5.18*  LATICACIDVEN  --  0.90  --   --   --   --   --     Estimated Creatinine Clearance: 20.6 mL/min (A) (by C-G formula based on SCr of 5.18 mg/dL (H)).    No Known Allergies  Antimicrobials this admission: Vancomycin 10/31 >>  Ceftriaxone 11/1>>  Microbiology results: 11/1 BCx: ngtd 11/1 MRSA PCR: pos 10/31 urine: >100K EColi, R to ampicillin/Unasyn, cipro, Bactrim  Thank you Anette Guarneri, PharmD [phone] 01/03/2017 8:06 AM

## 2017-01-03 NOTE — Progress Notes (Signed)
CSW spoke with supervisor concerning pt case- since pt has insurance and has already used all 100 days of SNF benefits he is not eligible for any kind of assistance at this time.  CSW informed pt he would either have to pay privately for SNF or return home with home services- pt feels as if it would be unaffordable to pay for SNF and will need to return home- continues to state it is just him and his wife and no other support system.  CSW left message for wife and will explain situation when she returns call  CSW to update assigned RNCM of need for home health- CSW signing off  Jorge Ny, [place] Social Worker [phone]

## 2017-01-03 NOTE — Progress Notes (Signed)
[facility] Progress Note   Subjective: Seen in room. Tired today. HD yesterday, net UF 5L. Denies CP, SOB, N,V,D. Pain controlled   Objective Vitals:   01/02/17 1126 01/02/17 1737 01/02/17 2238 01/03/17 0615  BP: 125/67 114/60 117/63 114/64  Pulse: 78 80 76 75  Resp: 16 18 18 19   Temp: (!) 97 F (36.1 C) 98 F (36.7 C) 98.5 F (36.9 C) 98 F (36.7 C)  TempSrc: Oral Oral Oral Oral  SpO2: 98% 96% 97% 96%  Weight:   122.1 kg (269 lb 2.9 oz)   Height:       Physical Exam General: Obese male NAD Heart: RRR  Lungs: CTA anteriorly  Abdomen: soft, non-tender, urostomy present Extremities: RLE in unna wrap, L stump edema, wound  Dialysis Access: RUE AVF +bruit    Additional Objective Labs: Basic Metabolic Panel: Recent Labs  Lab 12/31/16 0326 01/02/17 0719 01/03/17 0320  NA 135 138 137  K 4.1 5.3* 4.2  CL 99* 102 98*  CO2 25 22 26   GLUCOSE 123* 125* 125*  BUN 41* 77* 43*  CREATININE 5.48* 8.03* 5.18*  CALCIUM 7.7* 8.0* 8.2*  PHOS  --  3.5  --    CBC: Recent Labs  Lab 12/30/16 0404 12/31/16 0326 01/01/17 0440 01/02/17 0719 01/03/17 0320  WBC 5.4 4.8 4.9 5.1 5.7  NEUTROABS 3.7 3.2 3.4  --  4.0  HGB 8.1* 8.1* 7.9* 7.8* 8.2*  HCT 26.9* 27.1* 26.6* 26.0* 27.3*  MCV 96.1 96.4 97.1 97.0 96.5  PLT 122* 110* 104* 116* 125*   Blood Culture    Component Value Date/Time   SDES BLOOD LEFT ARM 12/28/2016 0305   SPECREQUEST IN PEDIATRIC BOTTLE Blood Culture adequate volume 12/28/2016 0305   CULT NO GROWTH 5 DAYS 12/28/2016 0305   REPTSTATUS 01/02/2017 FINAL 12/28/2016 0305    Cardiac Enzymes: No results for input(s): CKTOTAL, CKMB, CKMBINDEX, TROPONINI in the last 168 hours. CBG: Recent Labs  Lab 01/01/17 2147 01/02/17 1304 01/02/17 1713 01/02/17 2235 01/03/17 0812  GLUCAP 118* 168* 194* 144* 103*   Iron Studies: No results for input(s): IRON, TIBC, TRANSFERRIN, FERRITIN in the last 72 hours. Lab Results  Component Value Date   INR 1.78  (H) 03/07/2013   INR 2.59 (H) 01/03/2013   INR 1.84 (H) 12/09/2012   Medications: . cefTRIAXone (ROCEPHIN)  IV Stopped (01/02/17 2032)  . vancomycin 1,000 mg (01/02/17 1028)   . atorvastatin  40 mg Oral q1800  . feeding supplement (PRO-STAT SUGAR FREE 64)  30 mL Oral BID  . ferrous sulfate  325 mg Oral BID  . heparin  5,000 Units Subcutaneous Q8H  . insulin aspart  0-9 Units Subcutaneous TID WC  . insulin glargine  28 Units Subcutaneous QHS  . levothyroxine  225 mcg Oral QAC breakfast  . midodrine  10 mg Oral Daily  . multivitamin with minerals  1 tablet Oral Daily  . omega-3 acid ethyl esters  1 g Oral BID  . sevelamer carbonate  800 mg Oral TID WC  . vitamin C  500 mg Oral BID   Dialysis Orders:  [facility] on [address]  TTS 4h 27min   2/2.5   117.5kg   RUE AVF   Hep none Mircera 200 q 2 wks (last 10/25)  No VDRA    Assessment/Plan: 1. Low Back Pain: Chronic L5 compression fracture, improving pain control noted at this time. 2. UTI (chronic urostomy): UCx + E. Coli on Ceftriaxone-  3. R Leg  wound/LBKAstump wound:Unna wrap per ortho.ON intravenous vancomycin. 4.ESRD: Will continue HD per TTS schedule, No heparin. Will re-evaluate EDW /UF goals closely.  5.Hypotension/volume: acceptable blood pressures on midodrine- monitor with UF. 6. Anemia: Hgb trending down,due for ESA 11/8.Follow. Tsat 19% On PO iron BID. 7. Secondary hyperparathyroidism:Ca/Phos ok. Continue Renvela as binder. 8. Nutrition: Alb very low.Continuepro-stat BID. 9. DM: On insulin, per primary. 10. Dispo: per primary, likely to SNF.   Lynnda Child PA-C [facility] Pager [phone] 01/03/2017,9:14 AM  LOS: 7 days

## 2017-01-03 NOTE — Progress Notes (Addendum)
Patient has been refusing to turn, patient allowed tech and RN to turn him once during shift to complete skin assessment but then refused to stay positioned on his side. Foam placed on stage 2 pressure injury.

## 2017-01-03 NOTE — Progress Notes (Addendum)
PROGRESS NOTE  [name] PYP:[id number] DOB: 05-07-1955 DOA: 12/27/2016 PCP: Patient, No Pcp Per  HPI/Recap of past 24 hours:  24 Hypertension, Hyperlipidemia, Diabetes, Hypothyroidism, Anxiety, morbid obesity peripheral vascular disease, ESRD, Urostomy, left BKA presents with lower back pain, foul-smelling urine, right leg wound with draining.    Assessment/Plan: Principal Problem:   Lower back pain Active Problems:   Type 2 diabetes mellitus with diabetic nephropathy, with long-term current use of insulin (HCC)   Essential hypertension   Hyperkalemia   Hypothyroidism, acquired   Cellulitis of left leg   Hyperlipidemia   ESRD on dialysis [facility])   UTI (urinary tract infection)   Idiopathic chronic venous hypertension of right lower extremity with inflammation  Chronic low back pain Improved CT lumbar spine showed chronic L5 vertebral body compression fracture Pain management PT/OT Case management social worker for placement  #Complicated UTI Status post urostomy, 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 coli sensitive to ceftriaxone D/c IV rocephin 11/7  #Cellulitis of the left BKA stump Afebrile, no leukocytosis CRP elevated, trending downwards Lower extremity Doppler, negative MRI of L stump to rule out osteomyelitis was incocnlusive  -examined wound and spoke to Dr. Jomarie Longs [place] nurse input, will see in 2 weeks op  To get shrinker from home  Change to clindamycin 300 tid x 5 days then stop [has had 7 days total ABx 11/7]  #Right leg lymphedema Ortho consulted, recommended Unna wrap Recheck RLE in am  #Type 2 diabetes mellitus Continue insulin regimen, SSI  #Hypertension Not on any meds Monitor  #ESRD Nephrology on board, dialysis T/T/S  #Hypothyroidism Continue home Synthroid  Code Status: Full  Family Communication: None at bedside  Disposition Plan: Possible SNF, SW/CM on  board   Consultants:  Nephrology  Orthopedics  Procedures:  None  Antimicrobials:  Vancomycin  Rocephin  DVT prophylaxis:  Heparin   Objective: Vitals:   01/02/17 1737 01/02/17 2238 01/03/17 0615 01/03/17 0900  BP: 114/60 117/63 114/64 118/62  Pulse: 80 76 75 86  Resp: 18 18 19 18   Temp: 98 F (36.7 C) 98.5 F (36.9 C) 98 F (36.7 C) 98.6 F (37 C)  TempSrc: Oral Oral Oral Oral  SpO2: 96% 97% 96% 98%  Weight:  122.1 kg (269 lb 2.9 oz)    Height:        Intake/Output Summary (Last 24 hours) at 01/03/2017 1559 Last data filed at 01/03/2017 1300 Gross per 24 hour  Intake 480 ml  Output 0 ml  Net 480 ml   Filed Weights   01/01/17 2151 01/02/17 0705 01/02/17 2238  Weight: 122.9 kg (271 lb) 122.1 kg (269 lb 2.9 oz) 122.1 kg (269 lb 2.9 oz)    Exam:   Awake alert no distress rambling  eomi ncat  s1 s 2no m/r/g  abd soft, Urostomy in place, midline abd scar      No purulence expressed looks fair   Data Reviewed: CBC: Recent Labs  Lab 12/30/16 0404 12/31/16 0326 01/01/17 0440 01/02/17 0719 01/03/17 0320  WBC 5.4 4.8 4.9 5.1 5.7  NEUTROABS 3.7 3.2 3.4  --  4.0  HGB 8.1* 8.1* 7.9* 7.8* 8.2*  HCT 26.9* 27.1* 26.6* 26.0* 27.3*  MCV 96.1 96.4 97.1 97.0 96.5  PLT 122* 110* 104* 116* 245*   Basic Metabolic Panel: Recent Labs  Lab 12/30/16 0404 12/31/16 0326 01/02/17 0719 01/03/17 0320  NA 134* 135 138 137  K 3.8 4.1 5.3* 4.2  CL 97*  99* 102 98*  CO2 24 25 22 26   GLUCOSE 132* 123* 125* 125*  BUN 59* 41* 77* 43*  CREATININE 6.31* 5.48* 8.03* 5.18*  CALCIUM 7.7* 7.7* 8.0* 8.2*  PHOS  --   --  3.5  --    GFR: Estimated Creatinine Clearance: 20.6 mL/min (A) (by C-G formula based on SCr of 5.18 mg/dL (H)). Liver Function Tests: Recent Labs  Lab 01/02/17 0719  ALBUMIN 2.5*   No results for input(s): LIPASE, AMYLASE in the last 168 hours. No results for input(s): AMMONIA in the last 168 hours. Coagulation Profile: No results for  input(s): INR, PROTIME in the last 168 hours. Cardiac Enzymes: No results for input(s): CKTOTAL, CKMB, CKMBINDEX, TROPONINI in the last 168 hours. BNP (last 3 results) No results for input(s): PROBNP in the last 8760 hours. HbA1C: No results for input(s): HGBA1C in the last 72 hours. CBG: Recent Labs  Lab 01/02/17 1304 01/02/17 1713 01/02/17 2235 01/03/17 0812 01/03/17 1147  GLUCAP 168* 194* 144* 103* 182*   Lipid Profile: No results for input(s): CHOL, HDL, LDLCALC, TRIG, CHOLHDL, LDLDIRECT in the last 72 hours. Thyroid Function Tests: No results for input(s): TSH, T4TOTAL, FREET4, T3FREE, THYROIDAB in the last 72 hours. Anemia Panel: No results for input(s): VITAMINB12, FOLATE, FERRITIN, TIBC, IRON, RETICCTPCT in the last 72 hours. Urine analysis:    Component Value Date/Time   COLORURINE AMBER (A) 12/27/2016 1601   APPEARANCEUR TURBID (A) 12/27/2016 1601   LABSPEC >1.046 (H) 12/27/2016 1601   PHURINE 5.0 12/27/2016 1601   GLUCOSEU NEGATIVE 12/27/2016 1601   HGBUR NEGATIVE 12/27/2016 1601   BILIRUBINUR NEGATIVE 12/27/2016 1601   BILIRUBINUR neg 08/10/2014 1637   KETONESUR NEGATIVE 12/27/2016 1601   PROTEINUR NEGATIVE 12/27/2016 1601   UROBILINOGEN 1.0 08/10/2014 1637   UROBILINOGEN 0.2 08/03/2014 1225   NITRITE NEGATIVE 12/27/2016 1601   LEUKOCYTESUR TRACE (A) 12/27/2016 1601   Sepsis Labs: @LABRCNTIP (procalcitonin:4,lacticidven:4)  ) Recent Results (from the past 240 hour(s))  Urine culture     Status: Abnormal   Collection Time: 12/27/16  4:01 PM  Result Value Ref Range Status   Specimen Description URINE, RANDOM  Final   Special Requests NONE  Final   Culture >=100,000 COLONIES/mL ESCHERICHIA COLI (A)  Final   Report Status 12/29/2016 FINAL  Final   Organism ID, Bacteria ESCHERICHIA COLI (A)  Final      Susceptibility   Escherichia coli - MIC*    AMPICILLIN >=32 RESISTANT Resistant     CEFAZOLIN <=4 SENSITIVE Sensitive     CEFTRIAXONE <=1 SENSITIVE  Sensitive     CIPROFLOXACIN >=4 RESISTANT Resistant     GENTAMICIN >=16 RESISTANT Resistant     IMIPENEM <=0.25 SENSITIVE Sensitive     NITROFURANTOIN <=16 SENSITIVE Sensitive     TRIMETH/SULFA >=320 RESISTANT Resistant     AMPICILLIN/SULBACTAM >=32 RESISTANT Resistant     PIP/TAZO <=4 SENSITIVE Sensitive     Extended ESBL NEGATIVE Sensitive     * >=100,000 COLONIES/mL ESCHERICHIA COLI  Culture, blood (Routine X 2) w Reflex to ID Panel     Status: None   Collection Time: 12/28/16  3:05 AM  Result Value Ref Range Status   Specimen Description BLOOD LEFT ARM  Final   Special Requests IN PEDIATRIC BOTTLE Blood Culture adequate volume  Final   Culture NO GROWTH 5 DAYS  Final   Report Status 01/02/2017 FINAL  Final  MRSA PCR Screening     Status: Abnormal   Collection Time: 12/28/16  7:04  AM  Result Value Ref Range Status   MRSA by PCR POSITIVE (A) NEGATIVE Final    Comment:        The GeneXpert MRSA Assay (FDA approved for NASAL specimens only), is one component of a comprehensive MRSA colonization surveillance program. It is not intended to diagnose MRSA infection nor to guide or monitor treatment for MRSA infections. RESULT CALLED TO, READ BACK BY AND VERIFIED WITH: Gordy Clement RN 9:45 12/28/16 ([email or web address])       Studies: Mr Tibia Fibula Left Wo Contrast  Result Date: 01/02/2017 CLINICAL DATA:  Left below-knee amputation with drainage. Rule out osteomyelitis. EXAM: MRI OF LOWER LEFT EXTREMITY WITHOUT CONTRAST TECHNIQUE: Multiplanar, multisequence MR imaging of the left leg was performed. No intravenous contrast was administered. COMPARISON:  Radiographs of the left knee from 09/05/2016. FINDINGS: The patient could not tolerate the exam and terminated study prior to completion. Axial T1, axial T2 and coronal T2 STIR images were acquired only prior to terminating the exam. Bones/Joint/Cartilage Susceptibility artifacts from the patient's bilateral hip arthroplasties, left knee  arthroplasty and right plate and screw fixation of the femur. The patient is status post below-knee amputation. No marrow signal abnormalities indicative of osteomyelitis or edema of the remaining left tibial nor fibular diaphysis. Ligaments Noncontributory Soft tissues /muscles: Generalized mild-to-moderate diffuse soft tissue and intramuscular edema about the below-knee amputation with more focal subcutaneous 4 x 1.9 x 1.7 cm subcutaneous fluid collection or abscess along the lateral aspect of the stump. IMPRESSION: 1. The study is limited by patient inability to complete the exam due to marked discomfort. 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 2. There is generalized soft tissue and intramuscular edema surrounding the amputation site in the proximal left leg with more focal 4 x 1.9 x 1.7 cm fluid collection along the lateral aspect of the stump that may represent a small abscess given history a draining wound. 3. No marrow signal abnormality of the visualized proximal left tibia nor fibula to suggest acute osteomyelitis. Electronically Signed   By: Ashley Royalty M.D.   On: 01/02/2017 22:47    Scheduled Meds: . atorvastatin  40 mg Oral q1800  . [START ON 01/04/2017] darbepoetin (ARANESP) injection - DIALYSIS  200 mcg Intravenous Q Thu-HD  . feeding supplement (PRO-STAT SUGAR FREE 64)  30 mL Oral BID  . ferrous sulfate  325 mg Oral BID  . heparin  5,000 Units Subcutaneous Q8H  . insulin aspart  0-9 Units Subcutaneous TID WC  . insulin glargine  28 Units Subcutaneous QHS  . levothyroxine  225 mcg Oral QAC breakfast  . midodrine  10 mg Oral Daily  . multivitamin with minerals  1 tablet Oral Daily  . omega-3 acid ethyl esters  1 g Oral BID  . sevelamer carbonate  800 mg Oral TID WC  . vitamin C  500 mg Oral BID    Continuous Infusions: . cefTRIAXone  (ROCEPHIN)  IV Stopped (01/02/17 2032)  . vancomycin 1,000 mg (01/02/17 1028)     LOS: 7 days     Nita Sells, MD [facility]   If 7PM-7AM, please contact night-coverage [email or web address] Password [email or web address] 01/03/2017, 3:59 PM

## 2017-01-03 NOTE — Care Management Note (Signed)
Case Management Note  Patient Details  Name: [name] MRN: [id number] Date of Birth: 24-Oct-1955  Subjective/Objective:     Low back pain, ESRD, cellulitis of left BKA stump               Action/Plan: Discharge Planning: NCM spoke to pt at bedside. Offered choice for HH/list provided. Pt states he had [facility] in the past. But states he does not believe that will accept him back Will follow up with [facility] on 01/04/2017 with new referral. Pt states he has Wheelchair and bedside commode at home. States he does not have room in home for hospital bed or hoyer lift. States his wife works and does not have anyone at home to assist him. Attempted call to wife, [name] # [phone] or [phone]. Left message for wife to return call. Will need [employer] transportation home.     Expected Discharge Date:  01/04/2017              Expected Discharge Plan:  [place]  In-House Referral:  Clinical Social Work  Discharge planning Services  CM Consult  Post Acute Care Choice:  Home Health Choice offered to:  Patient  DME Arranged:  N/A DME Agency:  NA  HH Arranged:  RN, PT, Nurse's Aide, Social Work [employer] Agency:  [facility]  Status of Service:  In process, will continue to follow  If discussed at Long Length of Stay Meetings, dates discussed:    Additional Comments:  Erenest Rasher, RN 01/03/2017, 6:08 PM

## 2017-01-04 LAB — CBC WITH DIFFERENTIAL/PLATELET
BASOS PCT: 0 %
Basophils Absolute: 0 10*3/uL (ref 0.0–0.1)
EOS ABS: 0.1 10*3/uL (ref 0.0–0.7)
EOS PCT: 3 %
HCT: 26.6 % — ABNORMAL LOW (ref 39.0–52.0)
Hemoglobin: 8 g/dL — ABNORMAL LOW (ref 13.0–17.0)
LYMPHS ABS: 1.2 10*3/uL (ref 0.7–4.0)
Lymphocytes Relative: 22 %
MCH: 29.2 pg (ref 26.0–34.0)
MCHC: 30.1 g/dL (ref 30.0–36.0)
MCV: 97.1 fL (ref 78.0–100.0)
Monocytes Absolute: 0.4 10*3/uL (ref 0.1–1.0)
Monocytes Relative: 7 %
Neutro Abs: 3.7 10*3/uL (ref 1.7–7.7)
Neutrophils Relative %: 68 %
PLATELETS: 136 10*3/uL — AB (ref 150–400)
RBC: 2.74 MIL/uL — AB (ref 4.22–5.81)
RDW: 16.1 % — ABNORMAL HIGH (ref 11.5–15.5)
WBC: 5.4 10*3/uL (ref 4.0–10.5)

## 2017-01-04 LAB — VANCOMYCIN, RANDOM: VANCOMYCIN RM: 20

## 2017-01-04 LAB — GLUCOSE, CAPILLARY
Glucose-Capillary: 100 mg/dL — ABNORMAL HIGH (ref 65–99)
Glucose-Capillary: 113 mg/dL — ABNORMAL HIGH (ref 65–99)
Glucose-Capillary: 152 mg/dL — ABNORMAL HIGH (ref 65–99)

## 2017-01-04 LAB — RENAL FUNCTION PANEL
Albumin: 2.5 g/dL — ABNORMAL LOW (ref 3.5–5.0)
Anion gap: 15 (ref 5–15)
BUN: 63 mg/dL — ABNORMAL HIGH (ref 6–20)
CALCIUM: 8 mg/dL — AB (ref 8.9–10.3)
CO2: 21 mmol/L — AB (ref 22–32)
CREATININE: 6.55 mg/dL — AB (ref 0.61–1.24)
Chloride: 99 mmol/L — ABNORMAL LOW (ref 101–111)
GFR calc non Af Amer: 8 mL/min — ABNORMAL LOW (ref >60)
GFR, EST AFRICAN AMERICAN: 10 mL/min — AB (ref >60)
GLUCOSE: 95 mg/dL (ref 65–99)
Phosphorus: 3.9 mg/dL (ref 2.5–4.6)
Potassium: 4.9 mmol/L (ref 3.5–5.1)
SODIUM: 135 mmol/L (ref 135–145)

## 2017-01-04 MED ORDER — MIDODRINE HCL 5 MG PO TABS
ORAL_TABLET | ORAL | Status: AC
  Filled 2017-01-04: qty 2

## 2017-01-04 MED ORDER — LIDOCAINE-PRILOCAINE 2.5-2.5 % EX CREA: 1.0000 "application " | TOPICAL_CREAM | CUTANEOUS | Status: DC | PRN

## 2017-01-04 MED ORDER — COLLAGENASE 250 UNIT/GM EX OINT
TOPICAL_OINTMENT | Freq: Every day | CUTANEOUS | Status: DC
  Administered 2017-01-04 – 2017-01-05 (×2): via TOPICAL
  Filled 2017-01-04: qty 30

## 2017-01-04 MED ORDER — DARBEPOETIN ALFA 200 MCG/0.4ML IJ SOSY
PREFILLED_SYRINGE | INTRAMUSCULAR | Status: AC
  Filled 2017-01-04: qty 0.4

## 2017-01-04 MED ORDER — SODIUM CHLORIDE 0.9 % IV SOLN
100.0000 mL | INTRAVENOUS | Status: DC | PRN
Start: 2017-01-04 — End: 2017-01-04

## 2017-01-04 MED ORDER — LIDOCAINE HCL (PF) 1 % IJ SOLN: 5.0000 mL | INTRAMUSCULAR | Status: DC | PRN

## 2017-01-04 MED ORDER — SODIUM CHLORIDE 0.9 % IV SOLN: 100.0000 mL | INTRAVENOUS | Status: DC | PRN

## 2017-01-04 MED ORDER — PENTAFLUOROPROP-TETRAFLUOROETH EX AERO: 1.0000 "application " | INHALATION_SPRAY | CUTANEOUS | Status: DC | PRN

## 2017-01-04 MED ORDER — HEPARIN SODIUM (PORCINE) 1000 UNIT/ML DIALYSIS: 1000.0000 [IU] | INTRAMUSCULAR | Status: DC | PRN

## 2017-01-04 NOTE — Consult Note (Signed)
[place] Nurse wound consult note Reason for Consult: re-consulted, Dr. Sharol Given following this patient, however no wound care orders written for stump wound Wound type: full thickness ulceration of the left stump, venous stasis right LE.  It is also noted that patient has Stage 2 Pressure injury on his sacrum that was POA, treated currently per nursing staff with skin care order set. Pressure Injury POA: Yes sacrum, others are not Measurement: left stump: 3cm x 2cm x 0.1cm  Wound bed:50% yellow/50% pink Drainage (amount, consistency, odor) minimal, no odor today Periwound: intact  Dressing procedure/placement/frequency: Add enzymatic debridement ointment to the left stump wound, cover with 2x2 moistened with saline, top with dry dressing. Change daily.  Continue skin care order set for Stage 2 on the sacrum, foam dressing.  Noted patient is sometimes refusing to turn, needs turning and repositioned every 2 hours. Or at least attempt. Unnas boot ordered per Dr. Sharol Given for the RLE, it will be due for change tomorrow and each Friday.  Bedside nurse to notified orthopedic tech to change.   Discussed POC with patient and bedside nurse.  Re consult if needed, will not follow at this time. Thanks  Aithana Kushner [employer], RN,CWOCN, CNS, [place] [phone])

## 2017-01-04 NOTE — Consult Note (Signed)
Physical Medicine and Rehabilitation Consult  Reason for Consult: Debility in patient with L-BKA Referring Physician: Dr. Verlon Au.    HPI: [name] is a 61 y.o. male with history of T2DM, morbid obesity, DDD lumbar spine with chronic pain, ESRD- on HD TTS, L-BKA, ORIF right distal femur fracture 08/2016 ( NWB X 3 months and 10/29 was discharged from SNF). History taken from chart review and patient. He was admitted 12/28/16 after fall from wheelchair, drainage from RLE wound, low back pain and foul smelling urine.  He ws found to have E coli UTI and started on Ceftriaxone for treatment.  Dr. Sharol Given consulted for input on right tibial ulcer/edema and recommended Unna boot with weekly changes. Patient with significant deficits in mobility and ADLs. MD recommending CIR.   He has used up his SNF benefits and wife works days. He has been non-ambulatory for about a year and was doing SB transfers prior to fall out of the chair in July. He injured his shoulder which while trying to transfer out of car at home. Reports was cleared for [facility]? For transfer only due to poor healing of right hip.  Currently unable to manage by himself at home and MD recommending CIR for follow up therapy.      Review of Systems  HENT: Negative for hearing loss and tinnitus.   Eyes: Negative for blurred vision and double vision.  Respiratory: Negative for cough and shortness of breath.   Cardiovascular: Positive for leg swelling. Negative for chest pain and palpitations.  Gastrointestinal: Negative for heartburn and nausea.  Genitourinary: Negative for dysuria and urgency.  Musculoskeletal: Positive for back pain (sciatica LLE) and myalgias.  Neurological: Positive for sensory change (numbness left hand and steal syndrome RUE) and weakness. Negative for dizziness and headaches.  Psychiatric/Behavioral: The patient is nervous/anxious. The patient does not have insomnia.   All other systems reviewed and are  negative.     Past Medical History:  Diagnosis Date  . Anemia   . Anxiety   . AR (allergic rhinitis)   . Arthritis    osteoarthritis  . Blood transfusion   . BPH (benign prostatic hypertrophy)   . Cholelithiasis   . Chronic kidney disease    hx of kidney stones  . Chronic pain   . Diabetes mellitus    insulin dependent  . Displacement of lumbar intervertebral disc without myelopathy    L4-L5  . Diverticulosis of colon   . DVT (deep venous thrombosis) (HCC)    Lower Extremity  . Embolism and thrombosis of unspecified artery ([place])   . Gallstone   . History of nephrolithiasis    Bilateral  . Hyperlipidemia   . Hypertension   . Hypertrophy of prostate   . Hypothyroidism   . Impotence of organic origin   . Morbid obesity ([place])   . Nephrolithiasis   . Other lymphedema   . Pancreatitis, acute   . Peripheral vascular disease ([place])   . Personal history of arthritis    Osteoarthritis  . Sleep apnea    uses cpap  . Unspecified septicemia(038.9) [facility])     Past Surgical History:  Procedure Laterality Date  . bilteral hip     replacement & revison  's   . KIDNEY SURGERY    . KNEE SURGERY     left knee    . LEG AMPUTATION BELOW KNEE    . REPLACEMENT TOTAL KNEE    . TONSILLECTOMY  Family History  Problem Relation Age of Onset  . Diabetes Mother   . Heart attack Mother   . Stroke Mother   . Colon cancer Father   . Dementia Father   . Heart attack Father   . Arthritis Paternal Grandmother   . Hypertension Other   . Hypothyroidism Other     Social History: Married. Independent prior to July.  reports that  has never smoked. he has never used smokeless tobacco. He reports that he does not drink alcohol or use drugs.    Allergies: No Known Allergies    Medications Prior to Admission  Medication Sig Dispense Refill  . Ascorbic Acid (VITAMIN C PO) Take 1 tablet by mouth 2 (two) times daily.    Marland Kitchen atorvastatin (LIPITOR) 40 MG tablet TAKE 1 TABLET (40 MG  TOTAL) BY MOUTH DAILY. 90 tablet 1  . ferrous sulfate 325 (65 FE) MG tablet Take 1 tablet (325 mg total) by mouth 2 (two) times daily. 180 tablet 1  . gabapentin (NEURONTIN) 100 MG capsule TAKE 1 CAPSULE (100 MG TOTAL) BY MOUTH 3 (THREE) TIMES DAILY. 90 capsule 1  . insulin aspart (NOVOLOG) 100 UNIT/ML injection Inject as directed 2 (two) times daily. Sliding scale    . Insulin Glargine (BASAGLAR KWIKPEN) 100 UNIT/ML SOPN Inject 40 Units into the skin at bedtime.  1  . levothyroxine (SYNTHROID, LEVOTHROID) 200 MCG tablet Take 1 tablet (200 mcg total) by mouth every morning. (Patient taking differently: Take 225 mcg by mouth every morning. ) 90 tablet 0  . methenamine (HIPREX) 1 g tablet Take 1 g by mouth 2 (two) times daily.    . midodrine (PROAMATINE) 5 MG tablet Take 5 mg by mouth.    . Multiple Vitamins-Minerals (MULTIVITAMINS THER. W/MINERALS) TABS Take 1 tablet by mouth daily.     Marland Kitchen omega-3 acid ethyl esters (LOVAZA) 1 g capsule Take 1 g by mouth 2 (two) times daily.    . sevelamer carbonate (RENVELA) 800 MG tablet Take 800 mg by mouth 3 (three) times daily with meals.      Home: Home Living Family/patient expects to be discharged to:: Unsure Living Arrangements: Spouse/significant other Additional Comments: recently Discharged from SNF on Monday 10/29 per pt; states he had "just started doing slide board transfers; assistance needed for self care  Functional History: Prior Function Comments: receiving assistnace from SNF; pt reports he was able to complete slide board transfer but question amount of assistance needed; When asked about self care and managing pericare, he states " I hadn't had to do that yet - I was only home 2 days" Functional Status:  Mobility: Bed Mobility Overal bed mobility: Needs Assistance Bed Mobility: Rolling, Sidelying to Sit, Sit to Sidelying Rolling: Max assist, +2 for physical assistance Sidelying to sit: Max assist, +2 for physical assistance Supine to  sit: Max assist, +2 for physical assistance, HOB elevated Sit to supine: Max assist, +2 for physical assistance, HOB elevated Sit to sidelying: Max assist, +2 for physical assistance General bed mobility comments: Patient assisting with using railing and, though not following all cues, assisting some in coming upright, to supine assist to lower trunk and guide legs into bed Transfers General transfer comment: not attempted this session due to planned HD later this am      ADL: ADL Overall ADL's : Needs assistance/impaired Eating/Feeding: Set up Eating/Feeding Details (indicate cue type and reason): difficulty holding utnesils at times; may benefit from red tubing Grooming: Wash/dry hands, Wash/dry face, Oral  care, Moderate assistance, Sitting Grooming Details (indicate cue type and reason): EOB, needed assist for getting toothpaste on brush, assist for thoroughness with washing face Upper Body Bathing: Minimal assistance, Bed level Upper Body Bathing Details (indicate cue type and reason): to wash off chest Lower Body Bathing: Maximal assistance, Bed level Upper Body Dressing : Moderate assistance, Bed level Lower Body Dressing: Maximal assistance, +2 for physical assistance, Bed level Lower Body Dressing Details (indicate cue type and reason): to don adult diapers Functional mobility during ADLs: +2 for physical assistance, Maximal assistance General ADL Comments: Pt attempts to assist with bed mobility (benefits from multimodal cuing) and was able to maintain sitting balance for sitting EOB for grooming 15 min  Cognition: Cognition Overall Cognitive Status: Impaired/Different from baseline Orientation Level: Oriented X4 Cognition Arousal/Alertness: Awake/alert Behavior During Therapy: Anxious Overall Cognitive Status: Impaired/Different from baseline Area of Impairment: Attention, Following commands, Safety/judgement, Problem solving Current Attention Level: Sustained Following  Commands: Follows one step commands with increased time Safety/Judgement: Decreased awareness of deficits, Decreased awareness of safety Problem Solving: Slow processing, Decreased initiation, Requires verbal cues, Requires tactile cues General Comments: perseverated on having on needing to don underwear  Blood pressure 115/63, pulse 77, temperature 98.1 F (36.7 C), temperature source Oral, resp. rate 18, height 5' 11.5" (1.816 m), weight 117.2 kg (258 lb 6.1 oz), SpO2 99 %. Physical Exam  Nursing note and vitals reviewed. Constitutional: He is oriented to person, place, and time. He appears well-developed.  Obese  HENT:  Head: Normocephalic and atraumatic.  Mouth/Throat: Oropharynx is clear and moist.  Eyes: Conjunctivae and EOM are normal. Pupils are equal, round, and reactive to light.  Neck: Normal range of motion. Neck supple.  Cardiovascular: Normal rate and regular rhythm.  Respiratory: Effort normal and breath sounds normal. No stridor. No respiratory distress. He has no wheezes.  GI: Soft. Bowel sounds are normal. He exhibits no distension. There is no tenderness.  Musculoskeletal: He exhibits edema and tenderness.  L-BKA with foam dressing, appears cyanotic distally and with flexion contracture.  Kept RLE externally rotated--charcot foot?.  Neurological: He is alert and oriented to person, place, and time.  Motor: B/l UE 4/5 proximal to distal RLE: HF 2/5, KE 2/5 LLE: HF 2/5  Skin: Skin is warm and dry.  RLE: with dressing c/d/i Left BKA: with dressing c/d/i  Psychiatric: He has a normal mood and affect. His speech is tangential. Cognition and memory are normal. He is inattentive.    Results for orders placed or performed during the hospital encounter of 12/27/16 (from the past 24 hour(s))  Glucose, capillary     Status: Abnormal   Collection Time: 01/04/17  5:14 PM  Result Value Ref Range   Glucose-Capillary 113 (H) 65 - 99 mg/dL  Glucose, capillary     Status:  Abnormal   Collection Time: 01/04/17  9:10 PM  Result Value Ref Range   Glucose-Capillary 152 (H) 65 - 99 mg/dL  Glucose, capillary     Status: Abnormal   Collection Time: 01/05/17  7:33 AM  Result Value Ref Range   Glucose-Capillary 107 (H) 65 - 99 mg/dL   No results found.  Assessment/Plan: Diagnosis: Debility in patient with left BKA and RLE fracture Labs independently reviewed.  Records reviewed and summated above.  1. Does the need for close, 24 hr/day medical supervision in concert with the patient's rehab needs make it unreasonable for this patient to be served in a less intensive setting? No  2. Co-Morbidities requiring  supervision/potential complications: T2 DM (Monitor in accordance with exercise and adjust meds as necessary), morbid obesity (Body mass index is 35.53 kg/m., encourage weight loss to increase endurance and promote overall health), DDD lumbar spine with chronic pain (Biofeedback training with therapies to help reduce reliance on opiate pain medications, monitor pain control during therapies, and sedation at rest and titrate to maximum efficacy to ensure participation and gains in therapies), ESRD- on HD TTS, L-BKA, anemia of chronic disease (transfuse if necessary to ensure appropriate perfusion for increased activity tolerance), Thrombocytopenia (< 60,000/mm3 no resistive exercise) 3. Due to safety, skin/wound care, disease management, pain management and patient education, does the patient require 24 hr/day rehab nursing? Yes 4. Does the patient require coordinated care of a physician, rehab nurse, PT (1-2 hrs/day, 5 days/week) and OT (1-2 hrs/day, 5 days/week) to address physical and functional deficits in the context of the above medical diagnosis(es)? Yes Addressing deficits in the following areas: balance, endurance, locomotion, strength, transferring, bathing, dressing, toileting and psychosocial support 5. Can the patient actively participate in an intensive  therapy program of at least 3 hrs of therapy per day at least 5 days per week? No 6. The potential for patient to make measurable gains while on inpatient rehab is NA 7. Anticipated functional outcomes upon discharge from inpatient rehab are n/a  with PT, n/a with OT, n/a with SLP. 8. Estimated rehab length of stay to reach the above functional goals is: NA 9. Anticipated D/C setting: SNF 10. Anticipated post D/C treatments: SNF 11. Overall Rehab/Functional Prognosis: good and fair  RECOMMENDATIONS: This patient's condition is appropriate for continued rehabilitative care in the following setting: Pt unable to tolerate 3 hours of therapy/day and without caregiver support at discharge. Recommend SNF and if not available, recommend home with caregiver support and PM&R as outpt.  Patient has agreed to participate in recommended program. Potentially Note that insurance prior authorization may be required for reimbursement for recommended care.  Comment: Rehab Admissions Coordinator to follow up.  Delice Lesch, MD, ABPMR [facility] [place], [place] 01/05/2017

## 2017-01-04 NOTE — Progress Notes (Signed)
PROGRESS NOTE  [name] UUV:[id number] DOB: [birth year] DOA: 12/27/2016 PCP: Patient, No Pcp Per  HPI/Recap of past 24 hours:  90 Hypertension, Hyperlipidemia, Diabetes, Hypothyroidism, Anxiety, morbid obesity peripheral vascular disease, ESRD, Urostomy, left BKA presents with lower back pain, foul-smelling urine, right leg wound with draining.   SUBJ  Multiple non-medical complaints-no sleep and uncomfrotable Questioning how Hoyer lift will fit in his small house Also wants to try to go home by car, not PTAR No new issue Seen on HD No diarr no fever no chills  Assessment/Plan: Principal Problem:   Lower back pain Active Problems:   Type 2 diabetes mellitus with diabetic nephropathy, with long-term current use of insulin (HCC)   Essential hypertension   Hyperkalemia   Hypothyroidism, acquired   Cellulitis of left leg   Hyperlipidemia   ESRD on dialysis [facility])   UTI (urinary tract infection)   Idiopathic chronic venous hypertension of right lower extremity with inflammation  Chronic low back pain Improved CT lumbar spine showed chronic L5 vertebral body compression fracture Pain management PT/OT Case management HH Rn Pt Ot Aide and SW Doesn't wish HOyer lift at home  #Complicated UTI Status post urostomy, 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 coli sensitive to ceftriaxone D/c IV rocephin 11/7  #Cellulitis of the left BKA stump Afebrile, no leukocytosis CRP elevated, trending downwards Lower extremity Doppler, negative MRI of L stump to rule out osteomyelitis was incocnlusive  -examined wound and spoke to Dr. Jomarie Longs [place] nurse input, will see in 2 weeks op  To get shrinker from home  Change to clindamycin 300 tid till11/11/18 [has had 7 days total ABx 11/7]  #Right leg lymphedema Ortho consulted, recommended Unna wrap Recheck RLE priro to d/c home in am  #Type 2 diabetes mellitus Continue insulin  regimen, SSI Sugars are 100 range Continue lantus 28 U  #Hypertension Not on any meds Monitor  #ESRD Nephrology on board, dialysis T/T/S Binders as per Nephro Midodrine for low pressures in Dialysis 10 mg daily  #Hypothyroidism Continue home Synthroid 225 mcg daily Needs tsh in 1 mo  Code Status: Full Family Communication: None at bedside Disposition Plan: Possible SNF, SW/CM on board   Consultants:  Nephrology  Orthopedics  Procedures:  None  Antimicrobials:  Vancomycin  Rocephin  DVT prophylaxis:  Heparin   Objective: Vitals:   01/04/17 1130 01/04/17 1200 01/04/17 1230 01/04/17 1300  BP: (!) 109/58 117/64 108/60 (!) 114/58  Pulse: 80 82 84 84  Resp:      Temp:      TempSrc:      SpO2:      Weight:      Height:        Intake/Output Summary (Last 24 hours) at 01/04/2017 1404 Last data filed at 01/04/2017 0859 Gross per 24 hour  Intake 660 ml  Output 100 ml  Net 560 ml   Filed Weights   01/02/17 0705 01/02/17 2238 01/04/17 1100  Weight: 122.1 kg (269 lb 2.9 oz) 122.1 kg (269 lb 2.9 oz) 120 kg (264 lb 8.8 oz)    Exam:   Awake alert no distress rambling  eomi ncat  s1 s 2no m/r/g  abd soft, Urostomy in place, midline abd scar  Wound not examined today  Data Reviewed: CBC: Recent Labs  Lab 12/30/16 0404 12/31/16 0326 01/01/17 0440 01/02/17 0719 01/03/17 0320 01/04/17 0507  WBC 5.4 4.8 4.9 5.1 5.7 5.4  NEUTROABS 3.7 3.2 3.4  --  4.0 3.7  HGB 8.1* 8.1* 7.9* 7.8* 8.2* 8.0*  HCT 26.9* 27.1* 26.6* 26.0* 27.3* 26.6*  MCV 96.1 96.4 97.1 97.0 96.5 97.1  PLT 122* 110* 104* 116* 125* 893*   Basic Metabolic Panel: Recent Labs  Lab 12/30/16 0404 12/31/16 0326 01/02/17 0719 01/03/17 0320 01/04/17 0507  NA 134* 135 138 137 135  K 3.8 4.1 5.3* 4.2 4.9  CL 97* 99* 102 98* 99*  CO2 24 25 22 26  21*  GLUCOSE 132* 123* 125* 125* 95  BUN 59* 41* 77* 43* 63*  CREATININE 6.31* 5.48* 8.03* 5.18* 6.55*  CALCIUM 7.7* 7.7* 8.0* 8.2* 8.0*    PHOS  --   --  3.5  --  3.9   GFR: Estimated Creatinine Clearance: 16.1 mL/min (A) (by C-G formula based on SCr of 6.55 mg/dL (H)). Liver Function Tests: Recent Labs  Lab 01/02/17 0719 01/04/17 0507  ALBUMIN 2.5* 2.5*   No results for input(s): LIPASE, AMYLASE in the last 168 hours. No results for input(s): AMMONIA in the last 168 hours. Coagulation Profile: No results for input(s): INR, PROTIME in the last 168 hours. Cardiac Enzymes: No results for input(s): CKTOTAL, CKMB, CKMBINDEX, TROPONINI in the last 168 hours. BNP (last 3 results) No results for input(s): PROBNP in the last 8760 hours. HbA1C: No results for input(s): HGBA1C in the last 72 hours. CBG: Recent Labs  Lab 01/03/17 0812 01/03/17 1147 01/03/17 1710 01/03/17 2156 01/04/17 0740  GLUCAP 103* 182* 155* 117* 100*   Lipid Profile: No results for input(s): CHOL, HDL, LDLCALC, TRIG, CHOLHDL, LDLDIRECT in the last 72 hours. Thyroid Function Tests: No results for input(s): TSH, T4TOTAL, FREET4, T3FREE, THYROIDAB in the last 72 hours. Anemia Panel: No results for input(s): VITAMINB12, FOLATE, FERRITIN, TIBC, IRON, RETICCTPCT in the last 72 hours. Urine analysis:    Component Value Date/Time   COLORURINE AMBER (A) 12/27/2016 1601   APPEARANCEUR TURBID (A) 12/27/2016 1601   LABSPEC >1.046 (H) 12/27/2016 1601   PHURINE 5.0 12/27/2016 1601   GLUCOSEU NEGATIVE 12/27/2016 1601   HGBUR NEGATIVE 12/27/2016 1601   BILIRUBINUR NEGATIVE 12/27/2016 1601   BILIRUBINUR neg 08/10/2014 1637   KETONESUR NEGATIVE 12/27/2016 1601   PROTEINUR NEGATIVE 12/27/2016 1601   UROBILINOGEN 1.0 08/10/2014 1637   UROBILINOGEN 0.2 08/03/2014 1225   NITRITE NEGATIVE 12/27/2016 1601   LEUKOCYTESUR TRACE (A) 12/27/2016 1601   Sepsis Labs: @LABRCNTIP (procalcitonin:4,lacticidven:4)  ) Recent Results (from the past 240 hour(s))  Urine culture     Status: Abnormal   Collection Time: 12/27/16  4:01 PM  Result Value Ref Range Status    Specimen Description URINE, RANDOM  Final   Special Requests NONE  Final   Culture >=100,000 COLONIES/mL ESCHERICHIA COLI (A)  Final   Report Status 12/29/2016 FINAL  Final   Organism ID, Bacteria ESCHERICHIA COLI (A)  Final      Susceptibility   Escherichia coli - MIC*    AMPICILLIN >=32 RESISTANT Resistant     CEFAZOLIN <=4 SENSITIVE Sensitive     CEFTRIAXONE <=1 SENSITIVE Sensitive     CIPROFLOXACIN >=4 RESISTANT Resistant     GENTAMICIN >=16 RESISTANT Resistant     IMIPENEM <=0.25 SENSITIVE Sensitive     NITROFURANTOIN <=16 SENSITIVE Sensitive     TRIMETH/SULFA >=320 RESISTANT Resistant     AMPICILLIN/SULBACTAM >=32 RESISTANT Resistant     PIP/TAZO <=4 SENSITIVE Sensitive     Extended ESBL NEGATIVE Sensitive     * >=100,000 COLONIES/mL ESCHERICHIA COLI  Culture, blood (Routine X 2)  w Reflex to ID Panel     Status: None   Collection Time: 12/28/16  3:05 AM  Result Value Ref Range Status   Specimen Description BLOOD LEFT ARM  Final   Special Requests IN PEDIATRIC BOTTLE Blood Culture adequate volume  Final   Culture NO GROWTH 5 DAYS  Final   Report Status 01/02/2017 FINAL  Final  MRSA PCR Screening     Status: Abnormal   Collection Time: 12/28/16  7:04 AM  Result Value Ref Range Status   MRSA by PCR POSITIVE (A) NEGATIVE Final    Comment:        The GeneXpert MRSA Assay (FDA approved for NASAL specimens only), is one component of a comprehensive MRSA colonization surveillance program. It is not intended to diagnose MRSA infection nor to guide or monitor treatment for MRSA infections. RESULT CALLED TO, READ BACK BY AND VERIFIED WITH: Gordy Clement RN 9:45 12/28/16 ([email or web address])       Studies: No results found.  Scheduled Meds: . atorvastatin  40 mg Oral q1800  . clindamycin  300 mg Oral Q8H  . collagenase   Topical Daily  . darbepoetin (ARANESP) injection - DIALYSIS  200 mcg Intravenous Q Thu-HD  . feeding supplement (PRO-STAT SUGAR FREE 64)  30 mL Oral BID  . ferrous  sulfate  325 mg Oral BID  . heparin  5,000 Units Subcutaneous Q8H  . insulin aspart  0-9 Units Subcutaneous TID WC  . insulin glargine  28 Units Subcutaneous QHS  . levothyroxine  225 mcg Oral QAC breakfast  . midodrine  10 mg Oral Daily  . multivitamin with minerals  1 tablet Oral Daily  . omega-3 acid ethyl esters  1 g Oral BID  . sevelamer carbonate  800 mg Oral TID WC  . vitamin C  500 mg Oral BID    Continuous Infusions: . sodium chloride    . sodium chloride       LOS: 8 days     Nita Sells, MD [facility]   If 7PM-7AM, please contact night-coverage [email or web address] Password [email or web address] 01/04/2017, 2:04 PM

## 2017-01-04 NOTE — Progress Notes (Signed)
Occupational Therapy Treatment Patient Details Name: [name] MRN: [id number] DOB: 25-Aug-1955 Today's Date: 01/04/2017    History of present illness 61 y.o. male with medical history significant of hypertension, hyperlipidemia, diabetes mellitus, hypothyroidism, anxiety, OSA not on CPAP, morbid obesity, PVD, DVT, BPH, iron deficiency anemia, ESRD-HD (TTS), s/p of urostomy, s/p of left BKA, who presents with lower back pain, foul smelling in urine, right leg wound and draining.   OT comments  PT making progress towards OT goals this session. Pt putting forth good effort to assist during bed mobility. Pt really benefitted from being engaged in grooming activity EOB. He was able to tolerate sitting EOB without support (min guard) for approx 15 min and could have sat longer. Pt with recent discharge from SNF, and was at home less than 24 hours prior to re-hospitalization. After careful consideration with case management, PT, patient, and wife, we feel strongly that the best option for the Pt is comprehensive inpatient rehabilitation to work on functional transfers with a sliding board, increased independence in ADL and bed mobility so that the Pt can participate in more meaningful occupations and also be at a level to safely get home and get to and from a chair for HD. The Pt's house is too small/not condusive for lift equipment and so the Pt needs to be able to participate in wc level transfers with sliding board. OT will continue to follow in the acute setting.  Follow Up Recommendations  CIR;Supervision/Assistance - 24 hour    Equipment Recommendations  Other (comment)(defer to next venue)    Recommendations for Other Services Rehab consult    Precautions / Restrictions Precautions Precautions: Fall;Other (comment)(BLE wounds, urostomy bag) Restrictions Weight Bearing Restrictions: No       Mobility Bed Mobility Overal bed mobility: Needs Assistance Bed Mobility: Rolling;Sidelying  to Sit;Sit to Sidelying Rolling: Max assist;+2 for physical assistance(Pt participates in assisting) Sidelying to sit: Max assist;+2 for physical assistance(use of rails to assist)     Sit to sidelying: Max assist;+2 for physical assistance(multimodal cues, BLE assist, trunk descent assist) General bed mobility comments: Pt puts in genuine effort to assist with bed mobility, requires multimodal cues for sequencing  Transfers                 General transfer comment: not attempted this session    Balance Overall balance assessment: Needs assistance Sitting-balance support: Single extremity supported;Feet supported Sitting balance-Leahy Scale: Fair Sitting balance - Comments: initially pushing backwards due to discomfort, able to sit min guard for approx 15 min to complete grooming exercises                                   ADL either performed or assessed with clinical judgement   ADL Overall ADL's : Needs assistance/impaired     Grooming: Wash/dry hands;Wash/dry face;Oral care;Moderate assistance;Sitting Grooming Details (indicate cue type and reason): EOB, needed assist for getting toothpaste on brush, assist for thoroughness with washing face Upper Body Bathing: Minimal assistance;Bed level Upper Body Bathing Details (indicate cue type and reason): to wash off chest         Lower Body Dressing: Maximal assistance;+2 for physical assistance;Bed level Lower Body Dressing Details (indicate cue type and reason): to don adult diapers               General ADL Comments: Pt attempts to assist with bed mobility (benefits from multimodal cuing)  and was able to maintain sitting balance for sitting EOB for grooming 15 min     Vision       Perception     Praxis      Cognition Arousal/Alertness: Awake/alert Behavior During Therapy: Anxious Overall Cognitive Status: Impaired/Different from baseline Area of Impairment: Attention;Following  commands;Safety/judgement;Problem solving                   Current Attention Level: Sustained   Following Commands: Follows one step commands with increased time Safety/Judgement: Decreased awareness of deficits;Decreased awareness of safety   Problem Solving: Slow processing;Decreased initiation;Requires verbal cues;Requires tactile cues General Comments: perseverated on having on his adult diapers        Exercises     Shoulder Instructions       General Comments Pt's wife present this session, they do not have room in their home for lift equipment, and even then it would take an additional person to assist her with everything.     Pertinent Vitals/ Pain       Pain Assessment: Faces Faces Pain Scale: Hurts little more Pain Location: bottom Pain Descriptors / Indicators: Aching;Discomfort;Sore Pain Intervention(s): Monitored during session;Repositioned  Home Living                                          Prior Functioning/Environment              Frequency  Min 3X/week        Progress Toward Goals  OT Goals(current goals can now be found in the care plan section)  Progress towards OT goals: Progressing toward goals  Acute Rehab OT Goals Patient Stated Goal: to get stronger OT Goal Formulation: With patient Time For Goal Achievement: 01/11/17 Potential to Achieve Goals: Good  Plan Discharge plan needs to be updated;Frequency needs to be updated    Co-evaluation    PT/OT/SLP Co-Evaluation/Treatment: Yes Reason for Co-Treatment: For patient/therapist safety;To address functional/ADL transfers;Other (comment)(HD scheduled for later)   OT goals addressed during session: ADL's and self-care;Strengthening/ROM      AM-PAC PT "6 Clicks" Daily Activity     Outcome Measure   Help from another person eating meals?: None Help from another person taking care of personal grooming?: A Little Help from another person toileting, which  includes using toliet, bedpan, or urinal?: Total Help from another person bathing (including washing, rinsing, drying)?: A Lot Help from another person to put on and taking off regular upper body clothing?: A Lot Help from another person to put on and taking off regular lower body clothing?: Total 6 Click Score: 13    End of Session    OT Visit Diagnosis: Other abnormalities of gait and mobility (R26.89);Muscle weakness (generalized) (M62.81);Pain Pain - Right/Left: Right Pain - part of body: Shoulder;Leg   Activity Tolerance Patient tolerated treatment well   Patient Left in bed;with call bell/phone within reach;with bed alarm set   Nurse Communication Mobility status;Need for lift equipment        Time: [phone] OT Time Calculation (min): 45 min  Charges: OT General Charges $OT Visit: 1 Visit OT Treatments $Self Care/Home Management : 8-22 mins  Hulda Humphrey OTR/L [place] 01/04/2017, 12:31 PM

## 2017-01-04 NOTE — Progress Notes (Signed)
[facility] Progress Note   Subjective: Seen in room. Tired, not very interactive this morning. For HD today   Objective Vitals:   01/03/17 1710 01/03/17 2158 01/04/17 0548 01/04/17 0900  BP: 116/64 120/63 107/63 116/65  Pulse: 74 76 (!) 0 65  Resp: 18 18 18    Temp: 98.9 F (37.2 C) 98.1 F (36.7 C) 98.3 F (36.8 C) (!) 97.4 F (36.3 C)  TempSrc: Oral Oral Oral Axillary  SpO2: 97% 100% 96% 98%  Weight:      Height:       Physical Exam General: Obese male NAD Heart: RRR  Lungs: CTA anteriorly  Abdomen: soft, non-tender, urostomy present Extremities: RLE in unna wrap, L stump wound, trace edema  Dialysis Access: RUE AVF +bruit    Additional Objective Labs: Basic Metabolic Panel: Recent Labs  Lab 01/02/17 0719 01/03/17 0320 01/04/17 0507  NA 138 137 135  K 5.3* 4.2 4.9  CL 102 98* 99*  CO2 22 26 21*  GLUCOSE 125* 125* 95  BUN 77* 43* 63*  CREATININE 8.03* 5.18* 6.55*  CALCIUM 8.0* 8.2* 8.0*  PHOS 3.5  --  3.9   CBC: Recent Labs  Lab 12/31/16 0326 01/01/17 0440 01/02/17 0719 01/03/17 0320 01/04/17 0507  WBC 4.8 4.9 5.1 5.7 5.4  NEUTROABS 3.2 3.4  --  4.0 3.7  HGB 8.1* 7.9* 7.8* 8.2* 8.0*  HCT 27.1* 26.6* 26.0* 27.3* 26.6*  MCV 96.4 97.1 97.0 96.5 97.1  PLT 110* 104* 116* 125* 136*   Blood Culture    Component Value Date/Time   SDES BLOOD LEFT ARM 12/28/2016 0305   SPECREQUEST IN PEDIATRIC BOTTLE Blood Culture adequate volume 12/28/2016 0305   CULT NO GROWTH 5 DAYS 12/28/2016 0305   REPTSTATUS 01/02/2017 FINAL 12/28/2016 0305    Cardiac Enzymes: No results for input(s): CKTOTAL, CKMB, CKMBINDEX, TROPONINI in the last 168 hours. CBG: Recent Labs  Lab 01/03/17 0812 01/03/17 1147 01/03/17 1710 01/03/17 2156 01/04/17 0740  GLUCAP 103* 182* 155* 117* 100*   Iron Studies: No results for input(s): IRON, TIBC, TRANSFERRIN, FERRITIN in the last 72 hours. Lab Results  Component Value Date   INR 1.78 (H) 03/07/2013   INR 2.59  (H) 01/03/2013   INR 1.84 (H) 12/09/2012   Medications:  . atorvastatin  40 mg Oral q1800  . clindamycin  300 mg Oral Q8H  . collagenase   Topical Daily  . darbepoetin (ARANESP) injection - DIALYSIS  200 mcg Intravenous Q Thu-HD  . feeding supplement (PRO-STAT SUGAR FREE 64)  30 mL Oral BID  . ferrous sulfate  325 mg Oral BID  . heparin  5,000 Units Subcutaneous Q8H  . insulin aspart  0-9 Units Subcutaneous TID WC  . insulin glargine  28 Units Subcutaneous QHS  . levothyroxine  225 mcg Oral QAC breakfast  . midodrine  10 mg Oral Daily  . multivitamin with minerals  1 tablet Oral Daily  . omega-3 acid ethyl esters  1 g Oral BID  . sevelamer carbonate  800 mg Oral TID WC  . vitamin C  500 mg Oral BID   Dialysis Orders:  [facility] on [address]  TTS 4h 51min   2/2.5   117.5kg   RUE AVF   Hep none Mircera 200 q 2 wks (last 10/25)  No VDRA    Assessment/Plan: 1. Low Back Pain: Chronic L5 compression fracture, improving pain control noted at this time. 2. UTI (chronic urostomy): UCx + E. Coli s/p course of  ceftriaxone  3. R Leg wound/LBKAstump wound:Unna wrap per ortho.Completed course of IV Vanc changed to clindamycin x 5 days Onward following  4.ESRD: Will continue HD per TTS schedule, No heparin. Will re-evaluate EDW /UF goals closely.  5.Hypotension/volume: acceptable blood pressures on midodrine- monitor with UF. 6. Anemia: Hgb trending down,due for ESA 11/8.Follow. Tsat 19% On PO iron BID. Start IV Fe course when abx complete  7. Secondary hyperparathyroidism:Ca/Phos ok. Continue Renvela as binder. 8. Nutrition: Alb very low.Continuepro-stat BID. 9. DM: On insulin, per primary. 10. Dispo: per primary, CM/SW involved SNF vs HH    Aldous Housel Larina Earthly PA-C [place] Pager [phone] 01/04/2017,9:17 AM

## 2017-01-04 NOTE — Care Management Note (Addendum)
Case Management Note Previous Note Created by Jonnie Finner  Patient Details  Name: [name] MRN: [id number] Date of Birth: 10-Oct-1955  Subjective/Objective:     Low back pain, ESRD, cellulitis of left BKA stump               Action/Plan: Discharge Planning: NCM spoke to pt at bedside. Offered choice for HH/list provided. Pt states he had [facility] in the past. But states he does not believe that will accept him back Will follow up with [facility] on 01/04/2017 with new referral. Pt states he has Wheelchair and bedside commode at home. States he does not have room in home for hospital bed or hoyer lift. States his wife works and does not have anyone at home to assist him. Attempted call to wife, Brodin Gelpi # [phone] or [phone]. Left message for wife to return call. Will need PTAR transportation home.     Expected Discharge Date:  01/04/2017              Expected Discharge Plan:  [place]  In-House Referral:  Clinical Social Work  Discharge planning Services  CM Consult  Post Acute Care Choice:  Home Health Choice offered to:  Patient  DME Arranged:  N/A DME Agency:  NA  HH Arranged:  RN, PT, Nurse's Aide, Social Work, OT [place] Agency:  [place]  Status of Service:  In process, will continue to follow  If discussed at Long Length of Stay Meetings, dates discussed:    Additional Comments: Wife agreed for CM to locate additional resources - CM provided contact information to [facility] - resource will reach out to wife/pt and provide education on program and requirements.  Pt will require PTAR transport home  Physician advisor reviewed case and spoke directly with PT.  Physician Advisor confirmed that pt will not be able to receive LOG due to care being considered custodial - discharge plan will be to discharge home with [facility] and DME.  CM spoke with multiple [place] agencies in an attempt to develop a tentative schedule for modalities to be able to assist pt  in and out of wheelchair during T,T,S.  [facility] informed CM they could not commit to HD days for [facility].  [name] can commit to TTS but not specific times, [name] can commit to needed time schedule (session to be completed prior to 8:40 am as pt has a tentative pickup time for HD between 8:40 and 10am) and then again post HD on T,T - unfortunately Saturday times can not be committed.  CM spoke with wife - wife chose [name] based on time commitments - wife will develop plan for weekend.  Wife is also in agreement for a hoyer lift to be delivered to the home Friday 01/05/17 (wife has voiced concerns that equipment will not "fit" in the home) to see if lift can be utilized.  Wife is scheduled off work tomorrow and will be available all day - CM asked for [facility] and DME orders.  CM discussed this plan with both physician advisor and attending  CM left voicemail for Physician Advisor regarding case and pt discharge plan without support or equipment as recommended in the home; pt can not pivot to get into wheelchair from bed - he needs to be in wheelchair to ride [facility] bus to HD.  CM informed by CSW that LOG is not appropriate. CM informed that pt is not eligible for Home first program nor CIR. [name]  S, RN 01/04/2017, 2:09 PM

## 2017-01-04 NOTE — Consult Note (Signed)
   [facility] CM Inpatient Consult   01/04/2017  NAKSH RADI [birth year] [id number]   Telephone call received from [facility] with [facility] First program to request writer to cross check for [facility] eligibility.  Unfortunately Mr. Bartling is not eligible for [place] program as he in not eligible for [facility].   Reason:  Not a beneficiary currently attributed to one of the [place].  Membership roster used to verify non- eligible status. Tommi Rumps made aware of this.  Marthenia Rolling, MSN-Ed, RN,BSN [facility] Liaison [phone]

## 2017-01-04 NOTE — Progress Notes (Signed)
Physical Therapy Treatment Patient Details Name: [name] MRN: [id number] DOB: 05/04/1955 Today's Date: 01/04/2017    History of Present Illness 61 y.o. male with medical history significant of hypertension, hyperlipidemia, diabetes mellitus, hypothyroidism, anxiety, OSA not on CPAP, morbid obesity, PVD, DVT, BPH, iron deficiency anemia, ESRD-HD (TTS), s/p of urostomy, s/p of left BKA, who presents with lower back pain, foul smelling in urine, right leg wound and draining.    PT Comments    Patient progressing this session able to sit EOB and participate in ADL's and in supine LE therex.  Feel patient would not be able to access dialysis if d/c home due to extensive assist and wife not able to provide nor use hoyer lift in the home.  Patient has potential to progress to perform sliding board transfers with S level assist with aggressive interdisciplinary therapies in the inpatient rehab setting.  PT to follow acutely.   Follow Up Recommendations  Supervision/Assistance - 24 hour;CIR     Equipment Recommendations  None recommended by PT    Recommendations for Other Services       Precautions / Restrictions Precautions Precautions: Fall;Other (comment)(B LE wounds, L BKA, urostomy bag) Restrictions Weight Bearing Restrictions: No    Mobility  Bed Mobility Overal bed mobility: Needs Assistance Bed Mobility: Rolling;Sidelying to Sit;Sit to Sidelying Rolling: Max assist;+2 for physical assistance Sidelying to sit: Max assist;+2 for physical assistance     Sit to sidelying: Max assist;+2 for physical assistance General bed mobility comments: Patient assisting with using railing and, though not following all cues, assisting some in coming upright, to supine assist to lower trunk and guide legs into bed  Transfers                 General transfer comment: not attempted this session due to planned HD later this am  Ambulation/Gait                 Stairs             Wheelchair Mobility    Modified Rankin (Stroke Patients Only)       Balance Overall balance assessment: Needs assistance Sitting-balance support: Single extremity supported;Feet unsupported Sitting balance-Leahy Scale: Fair Sitting balance - Comments: initially leaning back due to scrotal discomfort, once positioned sat with close S for grooming tasks                                     Cognition Arousal/Alertness: Awake/alert Behavior During Therapy: Anxious Overall Cognitive Status: Impaired/Different from baseline Area of Impairment: Attention;Following commands;Safety/judgement;Problem solving                   Current Attention Level: Sustained   Following Commands: Follows one step commands with increased time Safety/Judgement: Decreased awareness of deficits;Decreased awareness of safety   Problem Solving: Slow processing;Decreased initiation;Requires verbal cues;Requires tactile cues General Comments: perseverated on having on needing to don underwear      Exercises General Exercises - Lower Extremity Long Arc Quad: AROM;Right;5 reps Heel Slides: AROM;AAROM;Right;Left;5 reps    General Comments General comments (skin integrity, edema, etc.): Patient initially complaining of needing time to rest and relax and read the paper prior to participating,  Easy to redirect that needing to participate due to having dialysis scheduled later today.  Wife entered later in session and reports unable to use hoyer lift in the home due to space limitations.  Also discussed concern for getting him to HD.  Spoke with RN CM and rehab admissions coordinator about possible options.      Pertinent Vitals/Pain Pain Assessment: Faces Faces Pain Scale: Hurts little more Pain Location: bottom Pain Descriptors / Indicators: Aching;Discomfort;Sore Pain Intervention(s): Repositioned;Monitored during session    Home Living                       Prior Function            PT Goals (current goals can now be found in the care plan section) Acute Rehab PT Goals Patient Stated Goal: to get stronger Progress towards PT goals: Progressing toward goals    Frequency    Min 2X/week      PT Plan Discharge plan needs to be updated    Co-evaluation PT/OT/SLP Co-Evaluation/Treatment: Yes Reason for Co-Treatment: For patient/therapist safety;To address functional/ADL transfers PT goals addressed during session: Mobility/safety with mobility;Balance;Strengthening/ROM OT goals addressed during session: ADL's and self-care;Strengthening/ROM      AM-PAC PT "6 Clicks" Daily Activity  Outcome Measure  Difficulty turning over in bed (including adjusting bedclothes, sheets and blankets)?: Unable Difficulty moving from lying on back to sitting on the side of the bed? : Unable Difficulty sitting down on and standing up from a chair with arms (e.g., wheelchair, bedside commode, etc,.)?: Unable Help needed moving to and from a bed to chair (including a wheelchair)?: Total Help needed walking in hospital room?: Total Help needed climbing 3-5 steps with a railing? : Total 6 Click Score: 6    End of Session   Activity Tolerance: Patient tolerated treatment well Patient left: in bed;with call bell/phone within reach;with family/visitor present   PT Visit Diagnosis: Muscle weakness (generalized) (M62.81);Other abnormalities of gait and mobility (R26.89)     Time: 1594-5859 PT Time Calculation (min) (ACUTE ONLY): 45 min  Charges:  $Therapeutic Exercise: 8-22 mins $Therapeutic Activity: 8-22 mins                    G CodesMagda Kiel, [place] [phone] 01/04/2017    Reginia Naas 01/04/2017, 12:56 PM

## 2017-01-05 DIAGNOSIS — E1121 Type 2 diabetes mellitus with diabetic nephropathy: Secondary | ICD-10-CM

## 2017-01-05 DIAGNOSIS — D638 Anemia in other chronic diseases classified elsewhere: Secondary | ICD-10-CM

## 2017-01-05 DIAGNOSIS — Z794 Long term (current) use of insulin: Secondary | ICD-10-CM

## 2017-01-05 DIAGNOSIS — D696 Thrombocytopenia, unspecified: Secondary | ICD-10-CM

## 2017-01-05 DIAGNOSIS — L03116 Cellulitis of left lower limb: Secondary | ICD-10-CM

## 2017-01-05 DIAGNOSIS — M5137 Other intervertebral disc degeneration, lumbosacral region: Secondary | ICD-10-CM

## 2017-01-05 LAB — GLUCOSE, CAPILLARY
GLUCOSE-CAPILLARY: 107 mg/dL — AB (ref 65–99)
GLUCOSE-CAPILLARY: 167 mg/dL — AB (ref 65–99)

## 2017-01-05 MED ORDER — OXYCODONE-ACETAMINOPHEN 5-325 MG PO TABS: 1.0000 | ORAL_TABLET | ORAL | 0 refills | Status: DC | PRN

## 2017-01-05 MED ORDER — COLLAGENASE 250 UNIT/GM EX OINT: TOPICAL_OINTMENT | Freq: Every day | CUTANEOUS | 0 refills | Status: DC

## 2017-01-05 MED ORDER — CLINDAMYCIN HCL 300 MG PO CAPS: 300.0000 mg | ORAL_CAPSULE | Freq: Three times a day (TID) | ORAL | 0 refills | Status: DC

## 2017-01-05 MED ORDER — INSULIN GLARGINE 100 UNIT/ML ~~LOC~~ SOLN: 28.0000 [IU] | Freq: Every day | SUBCUTANEOUS | 11 refills | Status: DC

## 2017-01-05 NOTE — Progress Notes (Signed)
[facility] Progress Note   Subjective: Eating breakfast, No C/O except about food. Denies fevers, chills, no new complaints.  Objective Vitals:   01/04/17 1519 01/04/17 1720 01/04/17 2126 01/05/17 0539  BP: 115/67 (!) 103/50 (!) 108/58 113/67  Pulse: 80 79 81 (!) 52  Resp: 18 18 20 18   Temp: 98 F (36.7 C) 98.3 F (36.8 C) 98.7 F (37.1 C) 98.2 F (36.8 C)  TempSrc: Oral Oral Oral   SpO2: 98% 98% 100% 100%  Weight: 117 kg (257 lb 15 oz)  117.2 kg (258 lb 6.1 oz)   Height:       Physical Exam General: Obese, NAD Heart: S1,S2 HS sl distant No M/R/B Lungs: CTAB A/P Abdomen: Urostomy RLQ No distention, no tenderness Extremities: RLE with UNA wrap, No L stump edema Dialysis Access: RUA AVF + Bruit  Additional Objective Labs: Basic Metabolic Panel: Recent Labs  Lab 01/02/17 0719 01/03/17 0320 01/04/17 0507  NA 138 137 135  K 5.3* 4.2 4.9  CL 102 98* 99*  CO2 22 26 21*  GLUCOSE 125* 125* 95  BUN 77* 43* 63*  CREATININE 8.03* 5.18* 6.55*  CALCIUM 8.0* 8.2* 8.0*  PHOS 3.5  --  3.9   Liver Function Tests: Recent Labs  Lab 01/02/17 0719 01/04/17 0507  ALBUMIN 2.5* 2.5*   No results for input(s): LIPASE, AMYLASE in the last 168 hours. CBC: Recent Labs  Lab 12/31/16 0326 01/01/17 0440 01/02/17 0719 01/03/17 0320 01/04/17 0507  WBC 4.8 4.9 5.1 5.7 5.4  NEUTROABS 3.2 3.4  --  4.0 3.7  HGB 8.1* 7.9* 7.8* 8.2* 8.0*  HCT 27.1* 26.6* 26.0* 27.3* 26.6*  MCV 96.4 97.1 97.0 96.5 97.1  PLT 110* 104* 116* 125* 136*   Blood Culture    Component Value Date/Time   SDES BLOOD LEFT ARM 12/28/2016 0305   SPECREQUEST IN PEDIATRIC BOTTLE Blood Culture adequate volume 12/28/2016 0305   CULT NO GROWTH 5 DAYS 12/28/2016 0305   REPTSTATUS 01/02/2017 FINAL 12/28/2016 0305    Cardiac Enzymes: No results for input(s): CKTOTAL, CKMB, CKMBINDEX, TROPONINI in the last 168 hours. CBG: Recent Labs  Lab 01/03/17 2156 01/04/17 0740 01/04/17 1714  01/04/17 2110 01/05/17 0733  GLUCAP 117* 100* 113* 152* 107*   Iron Studies: No results for input(s): IRON, TIBC, TRANSFERRIN, FERRITIN in the last 72 hours. @lablastinr3 @ Studies/Results: No results found. Medications:  . atorvastatin  40 mg Oral q1800  . clindamycin  300 mg Oral Q8H  . collagenase   Topical Daily  . darbepoetin (ARANESP) injection - DIALYSIS  200 mcg Intravenous Q Thu-HD  . feeding supplement (PRO-STAT SUGAR FREE 64)  30 mL Oral BID  . ferrous sulfate  325 mg Oral BID  . heparin  5,000 Units Subcutaneous Q8H  . insulin aspart  0-9 Units Subcutaneous TID WC  . insulin glargine  28 Units Subcutaneous QHS  . levothyroxine  225 mcg Oral QAC breakfast  . midodrine  10 mg Oral Daily  . multivitamin with minerals  1 tablet Oral Daily  . omega-3 acid ethyl esters  1 g Oral BID  . sevelamer carbonate  800 mg Oral TID WC  . vitamin C  500 mg Oral BID     Dialysis Orders:  [facility] on [address] TTS (Dr. Dimas Aguas) 4h 35min 2/2.5 117.5kg RUE AVF Hep none Mircera 200 q 2 wks (last 10/25)  No VDRA    Assessment/Plan: 1. Low Back Pain: Chronic L5 compression fracture,improving pain control noted at  this time. Is being evaluated for CIRC.  2. UTI (chronic urostomy): UCx + E. Coli s/p course of ceftriaxone, Ceftriaxone DC'd 01/03/2017 3. R Leg wound/LBKAstump wound:Unna wrap per ortho.Was on  IV Vanc now de-escalated to clindamycin x 5 days WOC following  4.ESRD: Will continue HD per TTS schedule, No heparin. HD tomorrow. K+ 4.9 11/08. Check labs with HD.  5.Hypotension/volume:acceptable blood pressures on midodrine- monitor with UF. 6. Anemia: HD 8.0 Rec'd Aranesp 200 mcg IV 01/04/17. Follow. Tsat 19% On PO iron BID. Start IV Fe course when abx complete  7. Secondary hyperparathyroidism:Ca/Phos ok. Continue Renvela as binder. 8. Nutrition: Alb very low.Continuepro-stat BID. 9. DM: On insulin, per primary. 10. Dispo: per primary,  CM/SW involved SNF vs HH however is now being evaluated for CIRC placement.      Clarke Peretz H. Charnay Nazario NP-C 01/05/2017, 8:36 AM  [employer] [phone]

## 2017-01-05 NOTE — Care Management Important Message (Signed)
Important Message  Patient Details  Name: [name] MRN: [id number] Date of Birth: May 04, 1955   Medicare Important Message Given:  Yes    [name], RN 01/05/2017, 9:50 AM

## 2017-01-05 NOTE — Progress Notes (Signed)
Gareth Eagle to be D/C'd Home per MD order.  Discussed prescriptions and follow up appointments with the patient. Prescriptions given to patient, medication list explained in detail. Pt verbalized understanding.  Allergies as of 01/05/2017   No Known Allergies     Medication List    STOP taking these medications   BASAGLAR KWIKPEN 100 UNIT/ML Sopn Replaced by:  insulin glargine 100 UNIT/ML injection   methenamine 1 g tablet Commonly known as:  HIPREX     TAKE these medications   atorvastatin 40 MG tablet Commonly known as:  LIPITOR TAKE 1 TABLET (40 MG TOTAL) BY MOUTH DAILY.   clindamycin 300 MG capsule Commonly known as:  CLEOCIN Take 1 capsule (300 mg total) every 8 (eight) hours by mouth.   collagenase ointment Commonly known as:  SANTYL Apply daily topically.   ferrous sulfate 325 (65 FE) MG tablet Take 1 tablet (325 mg total) by mouth 2 (two) times daily.   gabapentin 100 MG capsule Commonly known as:  NEURONTIN TAKE 1 CAPSULE (100 MG TOTAL) BY MOUTH 3 (THREE) TIMES DAILY.   insulin glargine 100 UNIT/ML injection Commonly known as:  LANTUS Inject 0.28 mLs (28 Units total) at bedtime into the skin. Replaces:  BASAGLAR KWIKPEN 100 UNIT/ML Sopn   levothyroxine 200 MCG tablet Commonly known as:  SYNTHROID, LEVOTHROID Take 1 tablet (200 mcg total) by mouth every morning. What changed:  how much to take   midodrine 5 MG tablet Commonly known as:  PROAMATINE Take 5 mg by mouth.   multivitamins ther. w/minerals Tabs tablet Take 1 tablet by mouth daily.   NOVOLOG 100 UNIT/ML injection Generic drug:  insulin aspart Inject as directed 2 (two) times daily. Sliding scale   omega-3 acid ethyl esters 1 g capsule Commonly known as:  LOVAZA Take 1 g by mouth 2 (two) times daily.   oxyCODONE-acetaminophen 5-325 MG tablet Commonly known as:  PERCOCET/ROXICET Take 1 tablet every 4 (four) hours as needed by mouth for moderate pain.   sevelamer carbonate 800 MG  tablet Commonly known as:  RENVELA Take 800 mg by mouth 3 (three) times daily with meals.   VITAMIN C PO Take 1 tablet by mouth 2 (two) times daily.            Durable Medical Equipment  (From admission, onward)        Start     Ordered   01/05/17 0953  For home use only DME Other see comment  Once    Comments:  Hoyer lift   01/05/17 0952      Vitals:   01/05/17 0539 01/05/17 1043  BP: 113/67 115/63  Pulse: (!) 52 77  Resp: 18 18  Temp: 98.2 F (36.8 C) 98.1 F (36.7 C)  SpO2: 100% 99%    Skin clean, dry and intact without evidence of skin break down, no evidence of skin tears noted. IV catheter discontinued intact. Site without signs and symptoms of complications. Dressing and pressure applied.Left stump dressing changed 01/05/17. Pt denies pain at this time. No complaints noted.  An After Visit Summary was printed and given to the patient. Patient escorted via stretcher, and D/C home via PTAR.  Chuck Hint RN [employer]

## 2017-01-05 NOTE — Care Management Note (Addendum)
Case Management Note Previous Note Created by [name]  Patient Details  Name: [name] MRN: [id number] Date of Birth: 1955-12-21  Subjective/Objective:     Low back pain, ESRD, cellulitis of left BKA stump               Action/Plan: Discharge Planning: NCM spoke to pt at bedside. Offered choice for HH/list provided. Pt states he had [facility] in the past. But states he does not believe that will accept him back Will follow up with [name] on 01/04/2017 with new referral. Pt states he has Wheelchair and bedside commode at home. States he does not have room in home for hospital bed or hoyer lift. States his wife works and does not have anyone at home to assist him. Attempted call to wife, Ac [place] # [phone] or [phone]. Left message for wife to return call. Will need [name] transportation home.     Expected Discharge Date:  01/04/2017              Expected Discharge Plan:  [name]  In-House Referral:  Clinical Social Work  Discharge planning Services  CM Consult  Post Acute Care Choice:  Home Health Choice offered to:  Patient  DME Arranged:  N/A, Other see comment(hoyer lift) DME Agency:  NA  HH Arranged:  PT, Nurse's Aide, Social Work, OT HH Agency:  [name]  Status of Service:  Completed, signed off  If discussed at [name], dates discussed:    Additional Comments: Pt to discharge home today via [name].  CM offered choice for DME - wife chose [name] - agency contacted and referral accepted - agency informed that pt will discharge home today.  CM informed [name] that pt will discharge today.  Pt has been deemed [name] appropriate by [name].   CM spoke with wife- wife stated she will be at home all day - CM informed wife that transportation will be arranged for approximately 2pm - bedside nurse to call wife once [name] arrives in facility.  Both pt and wife confirmed consent to refer to [name]  01/04/17 Wife  agreed for CM to locate additional resources - CM provided contact information to [name] - resource will reach out to wife/pt and provide education on program and requirements.  Pt will require [name] transport home  [name] reviewed case and spoke directly with PT.  [name] confirmed that pt will not be able to receive LOG due to care being considered custodial - discharge plan will be to discharge home with [facility] and DME.  CM spoke with multiple [name] agencies in an attempt to develop a tentative schedule for modalities to be able to assist pt in and out of wheelchair during T,T,S.  [name] informed CM they could not commit to HD days for [name].  [name] can commit to TTS but not specific times, [name] can commit to needed time schedule (session to be completed prior to 8:40 am as pt has a tentative pickup time for HD between 8:40 and 10am) and then again post HD on T,T - unfortunately Saturday times can not be committed.  CM spoke with wife - wife chose [name] based on time commitments - wife will develop plan for weekend.  Wife is also in agreement for a hoyer lift to be delivered to the home Friday 01/05/17 (wife has voiced concerns that equipment will not "fit" in the home) to see if lift can be utilized.  Wife is scheduled off work tomorrow and will be available all day - CM asked for [name] and DME orders.  CM discussed this plan with both [name] and attending  CM left voicemail for [name] regarding case and pt discharge plan without support or equipment as recommended in the home; pt can not pivot to get into wheelchair from bed - he needs to be in wheelchair to ride [facility] bus to HD.  CM informed by CSW that LOG is not appropriate. CM informed that pt is not eligible for Home first program nor CIR. [name] Labrador, RN 01/05/2017, 9:53 AM

## 2017-01-05 NOTE — Discharge Summary (Signed)
Physician Discharge Summary  [name] [id number]:[id number] DOB: [birth year] DOA: 12/27/2016  PCP: Patient, No Pcp Per  Admit date: 12/27/2016 Discharge date: 01/05/2017  Time spent: 35 minutes Continue Recommendations for Outpatient Follow-up:  1. Continue p.o. clindamycin until  11/12/201 or lower extremity wound and will need outpatient follow-up with orthopedist Dr. Sharol Given  with regards to the same  2. Una boots placed at this admission  continued use of the same and recommend as an outpatient  3. labs as per dialysis protocol CBC and renal panel in about 1 week 4. Obtain iron studies as well as consider IV iron infusion as well as erythropoietin if needed  Discharge Diagnoses:  Principal Problem:   Lower back pain Active Problems:   Type 2 diabetes mellitus with diabetic nephropathy, with long-term current use of insulin (HCC)   Essential hypertension   Hyperkalemia   Hypothyroidism, acquired   Cellulitis of left leg   Hyperlipidemia   ESRD on dialysis [facility])   UTI (urinary tract infection)   Idiopathic chronic venous hypertension of right lower extremity with inflammation   Discharge Condition: Improved  Diet recommendation: Diabetic heart healthy  Filed Weights   01/04/17 1100 01/04/17 1519 01/04/17 2126  Weight: 120 kg (264 lb 8.8 oz) 117 kg (257 lb 15 oz) 117.2 kg (258 lb 6.1 oz)    History of present illness: 61 year old male Type 2 diabetes mellitus, orbit obesity, ESRD HD TTS, left BKA, ORIFRight distal femoral fracture not weightbearing for 3 months and discharge 10/29 to SNF  found to have a E. coli UTI and orthopedics Dr. Sharol Given consulted for right tibial ulcer and edema  Hospital Course:  Chronic low back pain Improved CT lumbar spine showed chronic L5 vertebral body compression fracture Pain management PT/OT Case management HH Rn Pt Ot Aide and [employer] lift ordered as well for home PTAR to transfer to home   #Complicated UTI Status post urostomy, patient  is most likely colonized UA positive, urine culture growing E. coli sensitive to ceftriaxone D/c IV rocephin 11/7   #Cellulitis of the left BKA stump Afebrile, no leukocytosis CRP elevated, trending downwards Lower extremity Doppler, negative MRI of L stump to rule out osteomyelitis was incocnlusive             -examined wound and spoke to Dr. Jomarie Longs [place] nurse input, will see in 2 weeks op             To get shrinker from home             Change to clindamycin 300 tid till 01/07/17 [has had 7 days total ABx 11/7]   #Right leg lymphedema Ortho consulted, recommended Unna wrap LE examined priro to d/c and looked good as per pictures below   #Type 2 diabetes mellitus Continue insulin regimen, SSI as per home regimen on d/c Sugars are 100 range Continue lantus 28 U   #Hypertension Not on any meds Monitor   #ESRD Nephrology on board, dialysis T/T/S Binders as per Nephro Midodrine for low pressures in Dialysis 10 mg daily   #Hypothyroidism Continue home Synthroid 225 mcg daily Needs tsh in 1 mo  Consultations:  Orthopedics Dr. Sharol Given  Nephrology  Discharge Exam: Vitals:   01/04/17 2126 01/05/17 0539  BP: (!) 108/58 113/67  Pulse: 81 (!) 52  Resp: 20 18  Temp: 98.7 F (37.1 C) 98.2 F (36.8 C)  SpO2: 100% 100%    General: alert pleasant in nad no distress Cardiovascular:  s1 s  2no m/r/g, no added sound Respiratory: chest clear without added sound             Discharge Instructions   Discharge Instructions    Diet - low sodium heart healthy   Complete by:  As directed    Discharge instructions   Complete by:  As directed    You will need to complete the clindamycin as directed  Please keep your feet wrapped witht he Unna boots and get a follow up of your lower extremity by Dr. Sharol Given as an OP Please get labs as per Dialysis protocol We will get the necessary equipment ofr you and get you set up with home health You will probably need to have  further workup of this wound as an outpatient   Increase activity slowly   Complete by:  As directed      Current Discharge Medication List    START taking these medications   Details  clindamycin (CLEOCIN) 300 MG capsule Take 1 capsule (300 mg total) every 8 (eight) hours by mouth. Qty: 12 capsule, Refills: 0    collagenase (SANTYL) ointment Apply daily topically. Qty: 15 g, Refills: 0    insulin glargine (LANTUS) 100 UNIT/ML injection Inject 0.28 mLs (28 Units total) at bedtime into the skin. Qty: 10 mL, Refills: 11    oxyCODONE-acetaminophen (PERCOCET/ROXICET) 5-325 MG tablet Take 1 tablet every 4 (four) hours as needed by mouth for moderate pain. Qty: 20 tablet, Refills: 0      CONTINUE these medications which have NOT CHANGED   Details  Ascorbic Acid (VITAMIN C PO) Take 1 tablet by mouth 2 (two) times daily.    atorvastatin (LIPITOR) 40 MG tablet TAKE 1 TABLET (40 MG TOTAL) BY MOUTH DAILY. Qty: 90 tablet, Refills: 1    ferrous sulfate 325 (65 FE) MG tablet Take 1 tablet (325 mg total) by mouth 2 (two) times daily. Qty: 180 tablet, Refills: 1    gabapentin (NEURONTIN) 100 MG capsule TAKE 1 CAPSULE (100 MG TOTAL) BY MOUTH 3 (THREE) TIMES DAILY. Qty: 90 capsule, Refills: 1    insulin aspart (NOVOLOG) 100 UNIT/ML injection Inject as directed 2 (two) times daily. Sliding scale    levothyroxine (SYNTHROID, LEVOTHROID) 200 MCG tablet Take 1 tablet (200 mcg total) by mouth every morning. Qty: 90 tablet, Refills: 0    midodrine (PROAMATINE) 5 MG tablet Take 5 mg by mouth.    Multiple Vitamins-Minerals (MULTIVITAMINS THER. W/MINERALS) TABS Take 1 tablet by mouth daily.     omega-3 acid ethyl esters (LOVAZA) 1 g capsule Take 1 g by mouth 2 (two) times daily.    sevelamer carbonate (RENVELA) 800 MG tablet Take 800 mg by mouth 3 (three) times daily with meals.      STOP taking these medications     Insulin Glargine (BASAGLAR KWIKPEN) 100 UNIT/ML SOPN      methenamine  (HIPREX) 1 g tablet        No Known Allergies    The results of significant diagnostics from this hospitalization (including imaging, microbiology, ancillary and laboratory) are listed below for reference.    Significant Diagnostic Studies: Ct Lumbar Spine Wo Contrast  Result Date: 12/27/2016 CLINICAL DATA:  Status post fall July 2018. Severe low back pain for 3 days. EXAM: CT LUMBAR SPINE WITHOUT CONTRAST TECHNIQUE: Multidetector CT imaging of the lumbar spine was performed without intravenous contrast administration. Multiplanar CT image reconstructions were also generated. COMPARISON:  None. FINDINGS: Segmentation: 5 lumbar type vertebrae. Alignment: Normal. Vertebrae: Severe  osteopenia. Severe L5 vertebral body compression fracture with approximately 80% anterior height loss unchanged compared with 01/13/2016. Remainder the vertebral body heights are maintained. No aggressive osseous lesion. Bone destruction. Paraspinal and other soft tissues: No acute paraspinal abnormality. Bilateral atrophic kidneys with multiple cysts. Abdominal aortic atherosclerosis. Disc levels: Disc heights are maintained. At L1-2: Mild broad-based disc bulge. Mild bilateral facet arthropathy. No foraminal stenosis. At L2-3: Mild broad-based disc bulge. Mild bilateral facet arthropathy. No foraminal stenosis. At L3-4: Mild broad-based disc bulge. Mild bilateral facet arthropathy. Mild right foraminal stenosis. No left foraminal stenosis. At L4-5: Mild broad-based disc bulge. Moderate bilateral facet arthropathy, right worse than left. Mild right foraminal stenosis. No left foraminal stenosis. Central canal stenosis. At L5-S1: Broad-based disc osteophyte complex. Mild bilateral facet arthropathy. Bilateral foraminal stenosis. IMPRESSION: 1.  No acute osseous injury of the lumbar spine. 2. Chronic L5 vertebral body compression fracture. 3. Lumbar spine spondylosis as described above. Electronically Signed   By: Kathreen Devoid    On: 12/27/2016 16:30   Mr Tibia Fibula Left Wo Contrast  Result Date: 01/02/2017 CLINICAL DATA:  Left below-knee amputation with drainage. Rule out osteomyelitis. EXAM: MRI OF LOWER LEFT EXTREMITY WITHOUT CONTRAST TECHNIQUE: Multiplanar, multisequence MR imaging of the left leg was performed. No intravenous contrast was administered. COMPARISON:  Radiographs of the left knee from 09/05/2016. FINDINGS: The patient could not tolerate the exam and terminated study prior to completion. Axial T1, axial T2 and coronal T2 STIR images were acquired only prior to terminating the exam. Bones/Joint/Cartilage Susceptibility artifacts from the patient's bilateral hip arthroplasties, left knee arthroplasty and right plate and screw fixation of the femur. The patient is status post below-knee amputation. No marrow signal abnormalities indicative of osteomyelitis or edema of the remaining left tibial nor fibular diaphysis. Ligaments Noncontributory Soft tissues /muscles: Generalized mild-to-moderate diffuse soft tissue and intramuscular edema about the below-knee amputation with more focal subcutaneous 4 x 1.9 x 1.7 cm subcutaneous fluid collection or abscess along the lateral aspect of the stump. IMPRESSION: 1. The study is limited by patient inability to complete the exam due to marked discomfort. 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 2. There is generalized soft tissue and intramuscular edema surrounding the amputation site in the proximal left leg with more focal 4 x 1.9 x 1.7 cm fluid collection along the lateral aspect of the stump that may represent a small abscess given history a draining wound. 3. No marrow signal abnormality of the visualized proximal left tibia nor fibula to suggest acute osteomyelitis. Electronically Signed   By: Ashley Royalty M.D.   On: 01/02/2017  22:47   Xr Femur, Min 2 Views Right  Result Date: 12/20/2016 2 view radiographs of the right femur shows no interval callus formation.  Patient has significant calcification of the femoral arteries with a cut off just proximal to the popliteal vessels.  Xr Hip Unilat W Or W/o Pelvis 2-3 Views Left  Result Date: 12/20/2016 2 view radiographs of the left femur shows good callus formation of the femoral fracture.   Microbiology:  Labs: Basic Metabolic Panel: Recent Labs  Lab 12/30/16 0404 12/31/16 0326 01/02/17 0719 01/03/17 0320 01/04/17 0507  NA 134* 135 138 137 135  K 3.8 4.1 5.3* 4.2 4.9  CL 97* 99* 102 98* 99*  CO2 24 25 22 26  21*  GLUCOSE 132* 123* 125* 125* 95  BUN  59* 41* 77* 43* 63*  CREATININE 6.31* 5.48* 8.03* 5.18* 6.55*  CALCIUM 7.7* 7.7* 8.0* 8.2* 8.0*  PHOS  --   --  3.5  --  3.9   Liver Function Tests: Recent Labs  Lab 01/02/17 0719 01/04/17 0507  ALBUMIN 2.5* 2.5*   No results for input(s): LIPASE, AMYLASE in the last 168 hours. No results for input(s): AMMONIA in the last 168 hours. CBC: Recent Labs  Lab 12/30/16 0404 12/31/16 0326 01/01/17 0440 01/02/17 0719 01/03/17 0320 01/04/17 0507  WBC 5.4 4.8 4.9 5.1 5.7 5.4  NEUTROABS 3.7 3.2 3.4  --  4.0 3.7  HGB 8.1* 8.1* 7.9* 7.8* 8.2* 8.0*  HCT 26.9* 27.1* 26.6* 26.0* 27.3* 26.6*  MCV 96.1 96.4 97.1 97.0 96.5 97.1  PLT 122* 110* 104* 116* 125* 136*   Cardiac Enzymes: No results for input(s): CKTOTAL, CKMB, CKMBINDEX, TROPONINI in the last 168 hours. BNP: BNP (last 3 results) No results for input(s): BNP in the last 8760 hours.  ProBNP (last 3 results) No results for input(s): PROBNP in the last 8760 hours.  CBG: Recent Labs  Lab 01/03/17 2156 01/04/17 0740 01/04/17 1714 01/04/17 2110 01/05/17 0733  GLUCAP 117* 100* 113* 152* 107*       Signed:  Nita Sells MD   [facility] 01/05/2017, 9:41 AM

## 2017-01-05 NOTE — Progress Notes (Signed)
Pt is not an inpt rehab candidate for admission. SNF I recommended. I have alerted RN CM. [phone]

## 2017-01-05 NOTE — Progress Notes (Signed)
Orthopedic Tech Progress Note Patient Details:  [name] 1955/09/01 [id number]  Ortho Devices Type of Ortho Device: Ace wrap, Unna boot Ortho Device/Splint Location: rle Ortho Device/Splint Interventions: Application   [name] 01/05/2017, 11:41 AM

## 2017-01-05 NOTE — Progress Notes (Signed)
Physical Therapy Treatment Patient Details Name: [name] MRN: [id number] DOB: 02/18/1956 Today's Date: 01/05/2017    History of Present Illness 61 y.o. male with medical history significant of hypertension, hyperlipidemia, diabetes mellitus, hypothyroidism, anxiety, OSA not on CPAP, morbid obesity, PVD, DVT, BPH, iron deficiency anemia, ESRD-HD (TTS), s/p of urostomy, s/p of left BKA, who presents with lower back pain, foul smelling in urine, right leg wound and draining.    PT Comments    Pt/wife will likely have trouble dealing with transfers OOB if they go home.  Needs further rehab before going home and a lot of equipment to manage well at home with HHPT if he is unable to go to SNF.   Follow Up Recommendations  SNF     Equipment Recommendations  None recommended by PT(lift system for home)    Recommendations for Other Services       Precautions / Restrictions Precautions Precautions: Fall    Mobility  Bed Mobility Overal bed mobility: Needs Assistance Bed Mobility: Supine to Sit;Sit to Sidelying     Supine to sit: Max assist;+2 for physical assistance;HOB elevated   Sit to sidelying: Max assist;+2 for physical assistance General bed mobility comments: pt attempting assist with UE's and w/shifting with little success.  Pt unable to get himself to lean forward to build momentum..  Pt also unable to use UE's to scoot posteriorly.  Transfers                 General transfer comment: pt deferred try  Ambulation/Gait                 Stairs            Wheelchair Mobility    Modified Rankin (Stroke Patients Only)       Balance Overall balance assessment: Needs assistance Sitting-balance support: Feet unsupported;No upper extremity supported;Single extremity supported Sitting balance-Leahy Scale: Fair Sitting balance - Comments: intially leaning posteriorly until situated at EOB                                     Cognition Arousal/Alertness: Awake/alert Behavior During Therapy: Anxious Overall Cognitive Status: No family/caregiver present to determine baseline cognitive functioning(very perseverative)                                        Exercises Other Exercises Other Exercises: Worked on HS with graded resistance in extension x5 Other Exercises: Worked on stretching ER on the left to get L LE mor in functional alignment.    General Comments General comments (skin integrity, edema, etc.): pt not able to focus well to discuss needs for home.  If pt does not have room for a hoyer, he will likely not be getting up OOB except with PT and then only after working up to that point.  Would benefit from further therapy at SNF      Pertinent Vitals/Pain Pain Assessment: Faces Faces Pain Scale: Hurts little more Pain Location: left leg Pain Descriptors / Indicators: Sore Pain Intervention(s): Monitored during session;Limited activity within patient's tolerance    Home Living                      Prior Function            PT Goals (current  goals can now be found in the care plan section) Acute Rehab PT Goals Patient Stated Goal: to get stronger PT Goal Formulation: With patient Time For Goal Achievement: 01/11/17 Potential to Achieve Goals: Fair Progress towards PT goals: Not progressing toward goals - comment(self limiting with fears)    Frequency    Min 2X/week      PT Plan Current plan remains appropriate    Co-evaluation              AM-PAC PT "6 Clicks" Daily Activity  Outcome Measure  Difficulty turning over in bed (including adjusting bedclothes, sheets and blankets)?: Unable Difficulty moving from lying on back to sitting on the side of the bed? : Unable Difficulty sitting down on and standing up from a chair with arms (e.g., wheelchair, bedside commode, etc,.)?: Unable Help needed moving to and from a bed to chair (including a  wheelchair)?: Total Help needed walking in hospital room?: Total Help needed climbing 3-5 steps with a railing? : Total 6 Click Score: 6    End of Session   Activity Tolerance: Patient tolerated treatment well Patient left: in bed;with call bell/phone within reach;with family/visitor present Nurse Communication: Mobility status;Need for lift equipment PT Visit Diagnosis: Muscle weakness (generalized) (M62.81);Difficulty in walking, not elsewhere classified (R26.2) Pain - Right/Left: Right     Time: 1202-1228 PT Time Calculation (min) (ACUTE ONLY): 26 min  Charges:  $Therapeutic Activity: 23-37 mins                    G Codes:       18-Jan-2017  Donnella Sham, PT [phone]  (pager)   [name] 2017-01-18, 2:17 PM

## 2017-01-06 DIAGNOSIS — N186 End stage renal disease: Secondary | ICD-10-CM | POA: Diagnosis not present

## 2017-01-06 DIAGNOSIS — D631 Anemia in chronic kidney disease: Secondary | ICD-10-CM | POA: Diagnosis not present

## 2017-01-07 DIAGNOSIS — M545 Low back pain: Secondary | ICD-10-CM | POA: Diagnosis not present

## 2017-01-07 DIAGNOSIS — S32050D Wedge compression fracture of fifth lumbar vertebra, subsequent encounter for fracture with routine healing: Secondary | ICD-10-CM | POA: Diagnosis not present

## 2017-01-09 DIAGNOSIS — N186 End stage renal disease: Secondary | ICD-10-CM | POA: Diagnosis not present

## 2017-01-09 DIAGNOSIS — D631 Anemia in chronic kidney disease: Secondary | ICD-10-CM | POA: Diagnosis not present

## 2017-01-10 ENCOUNTER — Telehealth (INDEPENDENT_AMBULATORY_CARE_PROVIDER_SITE_OTHER): Payer: Self-pay | Admitting: Orthopedic Surgery

## 2017-01-10 NOTE — Telephone Encounter (Signed)
Patient called advised he want to speak with Dr Sharol Given concerning his left hip when he come to his appointment 01/17/17  The number to contact patient is [phone]

## 2017-01-11 DIAGNOSIS — N186 End stage renal disease: Secondary | ICD-10-CM | POA: Diagnosis not present

## 2017-01-11 DIAGNOSIS — D631 Anemia in chronic kidney disease: Secondary | ICD-10-CM | POA: Diagnosis not present

## 2017-01-13 DIAGNOSIS — D631 Anemia in chronic kidney disease: Secondary | ICD-10-CM | POA: Diagnosis not present

## 2017-01-13 DIAGNOSIS — N186 End stage renal disease: Secondary | ICD-10-CM | POA: Diagnosis not present

## 2017-01-15 DIAGNOSIS — N186 End stage renal disease: Secondary | ICD-10-CM | POA: Diagnosis not present

## 2017-01-15 DIAGNOSIS — D631 Anemia in chronic kidney disease: Secondary | ICD-10-CM | POA: Diagnosis not present

## 2017-01-17 ENCOUNTER — Ambulatory Visit (INDEPENDENT_AMBULATORY_CARE_PROVIDER_SITE_OTHER): Payer: BC Managed Care – PPO | Admitting: Orthopedic Surgery

## 2017-01-17 DIAGNOSIS — N186 End stage renal disease: Secondary | ICD-10-CM | POA: Diagnosis not present

## 2017-01-17 DIAGNOSIS — D631 Anemia in chronic kidney disease: Secondary | ICD-10-CM | POA: Diagnosis not present

## 2017-01-20 DIAGNOSIS — N186 End stage renal disease: Secondary | ICD-10-CM | POA: Diagnosis not present

## 2017-01-20 DIAGNOSIS — D631 Anemia in chronic kidney disease: Secondary | ICD-10-CM | POA: Diagnosis not present

## 2017-01-23 DIAGNOSIS — D631 Anemia in chronic kidney disease: Secondary | ICD-10-CM | POA: Diagnosis not present

## 2017-01-23 DIAGNOSIS — N186 End stage renal disease: Secondary | ICD-10-CM | POA: Diagnosis not present

## 2017-01-24 ENCOUNTER — Ambulatory Visit (INDEPENDENT_AMBULATORY_CARE_PROVIDER_SITE_OTHER): Payer: Medicare Other

## 2017-01-24 ENCOUNTER — Ambulatory Visit (INDEPENDENT_AMBULATORY_CARE_PROVIDER_SITE_OTHER): Payer: Medicare Other | Admitting: Family

## 2017-01-24 ENCOUNTER — Encounter (INDEPENDENT_AMBULATORY_CARE_PROVIDER_SITE_OTHER): Payer: Self-pay | Admitting: Orthopedic Surgery

## 2017-01-24 DIAGNOSIS — M25552 Pain in left hip: Secondary | ICD-10-CM

## 2017-01-24 DIAGNOSIS — G8929 Other chronic pain: Secondary | ICD-10-CM | POA: Diagnosis not present

## 2017-01-24 DIAGNOSIS — M25561 Pain in right knee: Secondary | ICD-10-CM

## 2017-01-24 NOTE — Progress Notes (Signed)
Office Visit Note   Patient: [name]           Date of Birth: [birth year]           MRN: [id number] Visit Date: 12/20/2016              Requested by: No referring provider defined for this encounter. PCP: Patient, No Pcp Per  No chief complaint on file.     HPI: Patient is a 61 year old gentleman who presents in follow-up status post left periprosthetic femur fracture on the left and a right supracondylar femur fracture.  The supracondylar femur fracture in the right underwent open reduction internal fixation.  Patient is full assist with a Hoyer lift for transfers.  He has a large ulcer on the left transtibial amputation.  Assessment & Plan: Visit Diagnoses:  1. Other closed fracture of distal end of right femur with routine healing, subsequent encounter   2. Traumatic closed nondisplaced fracture of neck of femur, left, sequela     Plan: No change in weight bearing status, transfers only.  Patient was given orders for dressing changes to the left decubitus below the knee amputation ulcer orders were written for foam padding around the ulcer to unload pressure from the ulcer.  Plan: 2 view radiographs of the left hip and 2 view radiographs of the right knee at follow-up.  Follow-Up Instructions: Return in about 3 weeks (around 01/10/2017).   Ortho Exam  Patient is alert, oriented, no adenopathy, well-dressed, normal affect, normal respiratory effort. Examination patient has a large decubitus ulcer on the posterior aspect of his left transtibial amputation due to pressure.  He lacks about 20 degrees to full extension of the knee.  He has flexion of about 30 degrees.  There is good granulation tissue in the wound bed. No drainage. there is no cellulitis no abscess no exposed bone.    Imaging:  No images are attached to the encounter.  Labs: Lab Results  Component Value Date   HGBA1C 5.3 03/20/2016   HGBA1C 5.3 04/29/2015   HGBA1C 6.5 (A) 12/24/2013   ESRSEDRATE 57  (H) 06/13/2010   ESRSEDRATE 40 (H) 07/04/2009   ESRSEDRATE 8 11/26/2008   CRP 4.8 (H) 07/04/2009   CRP 0.6 (H) 11/26/2008   LABURIC 8.3 (H) 11/25/2015   LABURIC 6.2 10/10/2008   LABURIC 5.6 03/10/2008   REPTSTATUS 12/01/2015 FINAL 11/26/2015   GRAMSTAIN  08/16/2012    NO WBC SEEN MODERATE SQUAMOUS EPITHELIAL CELLS PRESENT MODERATE YEAST   CULT NO GROWTH 5 DAYS 11/26/2015   LABORGA ESCHERICHIA COLI 08/03/2014    Orders:  Orders Placed This Encounter  Procedures  . XR HIP UNILAT W OR W/O PELVIS 2-3 VIEWS LEFT  . XR FEMUR, MIN 2 VIEWS RIGHT   No orders of the defined types were placed in this encounter.    Procedures: No procedures performed  Clinical Data: No additional findings.  ROS:  All other systems negative, except as noted in the HPI. Review of Systems  Objective: Vital Signs: There were no vitals taken for this visit.  Specialty Comments:  No specialty comments available.  PMFS History: Patient Active Problem List   Diagnosis Date Noted  . Non-pressure chronic ulcer of left calf, limited to breakdown of skin ([place]) 02/07/2016  . Hypotension, unspecified 11/24/2015  . Left knee pain 11/24/2015  . Hypotension 11/24/2015  . Decubitus ulcer of buttock 01/22/2015  . S/P BKA (below knee amputation) unilateral ([place]) 01/22/2015  . Chronic venous insufficiency 09/08/2014  .  Diabetes mellitus, type 2 ([place]) 09/08/2014  . ESRD on dialysis ([place]) 09/08/2014  . Hyperlipidemia 12/23/2013  . Diabetic foot ([place]) 11/20/2013  . Chronic pain 05/21/2013  . Chronic interstitial cystitis 02/06/2013  . UI (urinary incontinence) 10/29/2012  . CKD (chronic kidney disease) stage 3, GFR 30-59 ml/min (HCC) 08/18/2012  . Hypothyroidism, acquired 08/04/2012  . OBESITY 09/21/2009  . Essential hypertension 09/21/2009  . DIVERTICULOSIS OF COLON 09/21/2009  . Type 2 diabetes mellitus with diabetic nephropathy, with long-term current use of insulin ([place]) 10/01/2007  . DVT 10/01/2007    Past Medical History:  Diagnosis Date  . Anemia   . Anxiety   . AR (allergic rhinitis)   . Arthritis    osteoarthritis  . Blood transfusion   . BPH (benign prostatic hypertrophy)   . Cholelithiasis   . Chronic kidney disease    hx of kidney stones  . Chronic pain   . Diabetes mellitus    insulin dependent  . Displacement of lumbar intervertebral disc without myelopathy    L4-L5  . Diverticulosis of colon   . DVT (deep venous thrombosis) (H[facility])    Lower Extremity  . Embolism and thrombosis of unspecified artery ([place])   . Gallstone   . History of nephrolithiasis    Bilateral  . Hyperlipidemia   . Hypertension   . Hypertrophy of prostate   . Hypothyroidism   . Impotence of organic origin   . Morbid obesity ([place])   . Nephrolithiasis   . Other lymphedema   . Pancreatitis, acute   . Peripheral vascular disease ([place])   . Personal history of arthritis    Osteoarthritis  . Sleep apnea    uses cpap  . Unspecified septicemia(038.9) ([place])     Family History  Problem Relation Age of Onset  . Diabetes Mother   . Heart attack Mother   . Stroke Mother   . Colon cancer Father   . Dementia Father   . Heart attack Father   . Arthritis Paternal Grandmother   . Hypertension Other   . Hypothyroidism Other     Past Surgical History:  Procedure Laterality Date  . AMPUTATION  02/07/2011   Procedure: AMPUTATION BELOW KNEE;  Surgeon: Newt Minion, MD;  Location: [facility];  Service: Orthopedics;  Laterality: Left;  Left Below Knee Amputation  . bilteral hip     replacement & revison  's   . CYSTOSCOPY W/ RETROGRADES Bilateral 10/24/2012   Procedure: CYSTOSCOPY WITH RETROGRADE PYELOGRAM Procedure: Cystoscopy, Bilateral Retrogarde Pyelograms, Bladder Biopsy, Hydrodistension;  Surgeon: Franchot Gallo, MD;  Location: [facility];  Service: Urology;  Laterality: Bilateral;  . KIDNEY SURGERY    . KNEE SURGERY     left knee    . LEG AMPUTATION BELOW KNEE    . REPLACEMENT TOTAL KNEE    .  TONSILLECTOMY     Social History   Occupational History  . Not on file.   Social History Main Topics  . Smoking status: Never Smoker  . Smokeless tobacco: Never Used  . Alcohol use No  . Drug use: No  . Sexual activity: Yes

## 2017-01-25 DIAGNOSIS — N186 End stage renal disease: Secondary | ICD-10-CM | POA: Diagnosis not present

## 2017-01-25 DIAGNOSIS — D631 Anemia in chronic kidney disease: Secondary | ICD-10-CM | POA: Diagnosis not present

## 2017-01-26 ENCOUNTER — Telehealth (INDEPENDENT_AMBULATORY_CARE_PROVIDER_SITE_OTHER): Payer: Self-pay | Admitting: Orthopedic Surgery

## 2017-01-26 MED ORDER — OXYCODONE-ACETAMINOPHEN 5-325 MG PO TABS: 1.0000 | ORAL_TABLET | Freq: Three times a day (TID) | ORAL | 0 refills | Status: DC | PRN

## 2017-01-26 NOTE — Addendum Note (Signed)
Addended by: Maxcine Ham on: 01/26/2017 03:36 PM   Modules accepted: Orders

## 2017-01-26 NOTE — Telephone Encounter (Signed)
I called and spoke with patient advised rx at front desk, cannot call in bc it is a narcotic. He is going to have father pick up [employer].
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
## 2017-01-29 ENCOUNTER — Telehealth (INDEPENDENT_AMBULATORY_CARE_PROVIDER_SITE_OTHER): Payer: Self-pay | Admitting: Orthopedic Surgery

## 2017-01-29 ENCOUNTER — Encounter (INDEPENDENT_AMBULATORY_CARE_PROVIDER_SITE_OTHER): Payer: Self-pay | Admitting: Family

## 2017-01-29 NOTE — Telephone Encounter (Signed)
Patient called asked if he can get the wheelchair and cushion. Patient asked if he can continue the (PT,OT and have a nurse come to his home.) Patient advised he has been getting a unna boot each week and would need a nurse to assist him with that. The number to contact patient is [phone]

## 2017-01-30 ENCOUNTER — Telehealth (INDEPENDENT_AMBULATORY_CARE_PROVIDER_SITE_OTHER): Payer: Self-pay | Admitting: Orthopedic Surgery

## 2017-01-30 ENCOUNTER — Telehealth (INDEPENDENT_AMBULATORY_CARE_PROVIDER_SITE_OTHER): Payer: Self-pay | Admitting: Radiology

## 2017-01-30 NOTE — Telephone Encounter (Signed)
[name], PT with [facility] left voicemail on triage line stating patient's home health nurse advised patient is supposed to be NWB on Right LE. [name] would like to know if you would approve them training the patient to use a sliding board for transfers. He is now using a hoyer lift. If sliding board is approved, patient will have to use both upper extremities and may have to use partial weight on his right lower extremity.  Please call to advise.   [phone]

## 2017-01-30 NOTE — Telephone Encounter (Signed)
Nira Conn, the home health nurse, saw him this morning.  The patient and the wife are not being compliant with his wound care and putting a dirty sock on his foot.  The patient told the nurse that Dr. Sharol Given said he could be weight bearing on his right leg and he is not keeping the unna boot on and wont' keep his leg elevated.  The patient has developed a sore on his foot.  She would like to talk to someone about this.  The nurses CB#[phone]. It is okay to leave a detailed message on the voicemail. Thank you.

## 2017-01-30 NOTE — Telephone Encounter (Signed)
Don calling to see if patient will be transferred with [facility] lift or sliding boards transfers.  Please call to advise.  Pt is to be non weight bearing during transfers if approval is given for sliding boards

## 2017-01-30 NOTE — Telephone Encounter (Signed)
Called and sw physical therapy to advise that he can do tdwtb for transfers only .

## 2017-01-30 NOTE — Telephone Encounter (Signed)
error 

## 2017-01-30 NOTE — Telephone Encounter (Signed)
Called and lm on vm to advise HHN that per Dr. Sharol Given we could increase the dressing change frequency to twice a week and that she should encourage elevation and compliance. We could move the follow up appt up if she felt this was needed and that the pt is not to be wtb on the right leg except for transfer purposes if possible. I called the pt and lm on vm to advise that he must do his part in managing the swelling of this limb. The compression from the dressing can only do so much and so he is responsible for elevation and to not manipulate the dressing once it has been applied and to keep it dry and clean. Advised of his weight bearing status as well and to call the office should he have any questions.

## 2017-02-01 ENCOUNTER — Telehealth (INDEPENDENT_AMBULATORY_CARE_PROVIDER_SITE_OTHER): Payer: Self-pay | Admitting: Orthopedic Surgery

## 2017-02-01 ENCOUNTER — Other Ambulatory Visit (INDEPENDENT_AMBULATORY_CARE_PROVIDER_SITE_OTHER): Payer: Self-pay | Admitting: Family

## 2017-02-01 MED ORDER — OXYCODONE-ACETAMINOPHEN 5-325 MG PO TABS: 1.0000 | ORAL_TABLET | Freq: Two times a day (BID) | ORAL | 0 refills | Status: DC | PRN

## 2017-02-01 NOTE — Telephone Encounter (Signed)
Patient called asking for a refill on oxycodone. States he only has 2 pills left. CB # [phone]

## 2017-02-01 NOTE — Telephone Encounter (Signed)
I called pt and advised that rx is at the desk for pick up

## 2017-02-12 ENCOUNTER — Telehealth (INDEPENDENT_AMBULATORY_CARE_PROVIDER_SITE_OTHER): Payer: Self-pay | Admitting: Orthopedic Surgery

## 2017-02-12 NOTE — Telephone Encounter (Signed)
Patient calls to state that therapy order is running out and he really feels he still need it.  Please call patient he is concerned that no one else will be giving him much needed therapy

## 2017-02-13 NOTE — Telephone Encounter (Signed)
I called [name] with [facility] patients physical therapist. He explained they have one remaining visit. They are at a stand still really with patient. They have been seeing him for over 5 weeks now. Explains there is no constant caregiver. Patient moves with hoyer lift, has recommended to the patient a slide board on numerous occasions. This patient is a multi person assist, requiring him and occupational therapist and the patients wife for transfer on most occasions due to patients large body habitus. Which is understandable since it takes almost our entire office staff to transfer patient from chair to xray table. Advised patient on voicemail we will reevaluate at patients next office visit on 12/31 since Dr. Sharol Given nor Junie Panning are in the office all of next week.

## 2017-02-14 ENCOUNTER — Telehealth (INDEPENDENT_AMBULATORY_CARE_PROVIDER_SITE_OTHER): Payer: Self-pay | Admitting: Orthopedic Surgery

## 2017-02-14 NOTE — Telephone Encounter (Signed)
Error- encountered other message

## 2017-02-14 NOTE — Telephone Encounter (Signed)
Patient called with multiple concerns. His primary concern right now seems to be physical therapy. His last visit is Friday and I told him Sharol Given or Junie Panning would not be in the office next week and it looked like he would be reevaluated in the office on 12/31. He did not agree with this, saying that he would lose all the strength he had built up if he waits until then, he was asking about putting in another order for a different PT to see him. He also is wanting another wheel chair. He states that it is not deep or tall enough, the slide board is cracked and needs a new cushion. He said he expressed this his last visit here. He feels he might need to be remeasured for another wheelchair. His CB # [phone]

## 2017-02-14 NOTE — Telephone Encounter (Signed)
I called and lm on vm to advise that after 5 weeks of physical therapy that they do not belive there is anything else that they can offer the pt at this point until his weight bearing status changes. Needs an appt with Dr. Sharol Given to discuss what the next steps are and any equipment that is needed at that point that can be ordered when he is in the office.

## 2017-02-15 ENCOUNTER — Telehealth (INDEPENDENT_AMBULATORY_CARE_PROVIDER_SITE_OTHER): Payer: Self-pay | Admitting: Orthopedic Surgery

## 2017-02-15 NOTE — Telephone Encounter (Signed)
[place]   Pt is currently not being compliant with orders. Pt continues to take is boot (Unnacoot)off.[name] suggested teaching his wife how to do his dressings.

## 2017-02-15 NOTE — Telephone Encounter (Signed)
Had long discussion with [name]. Patient has history of noncompliance with wound care. He removes dressing after dialysis,states it is too tight, and applies dirty sock over his foot. They are going to transition to foam dressing, hopefully patient can have better compliance with this. It is difficult trying to communicate with patient that if he is noncompliant home health cannot continue care. And due to lack of progress with physical therapy they will have to discharge him from physical therapy, patient has follow up week after Christmas. Patient is at least 3 person assist even with a sliding board or hoyver lift. We will reevaluate at office visit. Home Health will be teaching wife dressing changes and discharge once family feels comfortable doing this on their own.

## 2017-02-16 ENCOUNTER — Telehealth (INDEPENDENT_AMBULATORY_CARE_PROVIDER_SITE_OTHER): Payer: Self-pay | Admitting: Orthopedic Surgery

## 2017-02-16 NOTE — Telephone Encounter (Signed)
Patient called needing Rx refilled (Oxycodone) Patient asked if he can get 2 Rx for (Oxycodone) so he will not have to come back to the office so often. Patient asked if a Rx can be faxed to [facility] for him to get a  a [employer]. Patient advised he is suing a regular slide board now. The number to contact patient is [phone]

## 2017-02-21 MED ORDER — OXYCODONE-ACETAMINOPHEN 5-325 MG PO TABS: 1.0000 | ORAL_TABLET | Freq: Four times a day (QID) | ORAL | 0 refills | Status: DC | PRN

## 2017-02-21 NOTE — Telephone Encounter (Signed)
[place] for [employer] - ok for rf pain meds 1 po q 6  to 8 #30    5 mg tablets

## 2017-02-21 NOTE — Telephone Encounter (Signed)
I called and left voicemail advising ok for board rx. I will fax to [facility]. He cannot get two prescriptions at a time for narcotics this is no feasible due to opoid laws. Dr. Marlou Sa did ok for refill, rx was signed and left at front desk for him to pick up when he is ready.

## 2017-02-24 ENCOUNTER — Emergency Department (HOSPITAL_COMMUNITY): Payer: Medicare Other

## 2017-02-24 ENCOUNTER — Encounter (HOSPITAL_COMMUNITY): Payer: Self-pay

## 2017-02-24 ENCOUNTER — Emergency Department (HOSPITAL_COMMUNITY)
Admission: EM | Admit: 2017-02-24 | Discharge: 2017-02-24 | Disposition: A | Discharge status: Home / Self Care | Payer: Medicare Other | Attending: Emergency Medicine | Admitting: Emergency Medicine

## 2017-02-24 ENCOUNTER — Emergency Department (HOSPITAL_BASED_OUTPATIENT_CLINIC_OR_DEPARTMENT_OTHER)
Admit: 2017-02-24 | Discharge: 2017-02-24 | Disposition: A | Discharge status: Home / Self Care | Payer: Medicare Other | Attending: Emergency Medicine | Admitting: Emergency Medicine

## 2017-02-24 DIAGNOSIS — E039 Hypothyroidism, unspecified: Secondary | ICD-10-CM | POA: Insufficient documentation

## 2017-02-24 DIAGNOSIS — I120 Hypertensive chronic kidney disease with stage 5 chronic kidney disease or end stage renal disease: Secondary | ICD-10-CM | POA: Diagnosis not present

## 2017-02-24 DIAGNOSIS — Z992 Dependence on renal dialysis: Secondary | ICD-10-CM | POA: Insufficient documentation

## 2017-02-24 DIAGNOSIS — E1122 Type 2 diabetes mellitus with diabetic chronic kidney disease: Secondary | ICD-10-CM | POA: Insufficient documentation

## 2017-02-24 DIAGNOSIS — Z96643 Presence of artificial hip joint, bilateral: Secondary | ICD-10-CM | POA: Diagnosis not present

## 2017-02-24 DIAGNOSIS — Z794 Long term (current) use of insulin: Secondary | ICD-10-CM | POA: Insufficient documentation

## 2017-02-24 DIAGNOSIS — N186 End stage renal disease: Secondary | ICD-10-CM | POA: Insufficient documentation

## 2017-02-24 DIAGNOSIS — Z79899 Other long term (current) drug therapy: Secondary | ICD-10-CM | POA: Insufficient documentation

## 2017-02-24 DIAGNOSIS — M79609 Pain in unspecified limb: Secondary | ICD-10-CM

## 2017-02-24 DIAGNOSIS — M25551 Pain in right hip: Secondary | ICD-10-CM | POA: Insufficient documentation

## 2017-02-24 DIAGNOSIS — Z96652 Presence of left artificial knee joint: Secondary | ICD-10-CM | POA: Insufficient documentation

## 2017-02-24 LAB — CBC WITH DIFFERENTIAL/PLATELET
BASOS ABS: 0 10*3/uL (ref 0.0–0.1)
Basophils Relative: 0 %
EOS PCT: 2 %
Eosinophils Absolute: 0.1 10*3/uL (ref 0.0–0.7)
HEMATOCRIT: 31.4 % — AB (ref 39.0–52.0)
Hemoglobin: 9.5 g/dL — ABNORMAL LOW (ref 13.0–17.0)
LYMPHS PCT: 19 %
Lymphs Abs: 1.1 10*3/uL (ref 0.7–4.0)
MCH: 29.9 pg (ref 26.0–34.0)
MCHC: 30.3 g/dL (ref 30.0–36.0)
MCV: 98.7 fL (ref 78.0–100.0)
MONO ABS: 0.4 10*3/uL (ref 0.1–1.0)
MONOS PCT: 7 %
NEUTROS ABS: 4.3 10*3/uL (ref 1.7–7.7)
Neutrophils Relative %: 72 %
PLATELETS: 145 10*3/uL — AB (ref 150–400)
RBC: 3.18 MIL/uL — ABNORMAL LOW (ref 4.22–5.81)
RDW: 17.1 % — AB (ref 11.5–15.5)
WBC: 5.9 10*3/uL (ref 4.0–10.5)

## 2017-02-24 LAB — BASIC METABOLIC PANEL
Anion gap: 12 (ref 5–15)
BUN: 57 mg/dL — AB (ref 6–20)
CALCIUM: 7.5 mg/dL — AB (ref 8.9–10.3)
CO2: 23 mmol/L (ref 22–32)
Chloride: 100 mmol/L — ABNORMAL LOW (ref 101–111)
Creatinine, Ser: 6.6 mg/dL — ABNORMAL HIGH (ref 0.61–1.24)
GFR calc Af Amer: 10 mL/min — ABNORMAL LOW (ref >60)
GFR, EST NON AFRICAN AMERICAN: 8 mL/min — AB (ref >60)
GLUCOSE: 153 mg/dL — AB (ref 65–99)
POTASSIUM: 5.2 mmol/L — AB (ref 3.5–5.1)
Sodium: 135 mmol/L (ref 135–145)

## 2017-02-24 MED ORDER — OXYCODONE-ACETAMINOPHEN 5-325 MG PO TABS
1.0000 | ORAL_TABLET | Freq: Once | ORAL | Status: AC
  Administered 2017-02-24: 1 via ORAL
  Filled 2017-02-24: qty 1

## 2017-02-24 MED ORDER — HYDROMORPHONE HCL 1 MG/ML IJ SOLN
1.0000 mg | Freq: Once | INTRAMUSCULAR | Status: AC
  Administered 2017-02-24: 1 mg via INTRAVENOUS
  Filled 2017-02-24: qty 1

## 2017-02-24 MED ORDER — INSULIN GLARGINE 100 UNIT/ML ~~LOC~~ SOLN: 28.0000 [IU] | Freq: Every day | SUBCUTANEOUS | 0 refills | Status: DC

## 2017-02-24 NOTE — ED Notes (Signed)
Signature pad unavailable.

## 2017-02-24 NOTE — ED Provider Notes (Signed)
[facility] DEPT Provider Note   CSN: [id number] Arrival date & time: 02/24/17  1133     History   Chief Complaint Chief Complaint  Patient presents with  . Groin Pain    right leg pain    HPI [name] is a 61 y.o. male.  HPI  61 year old male presents with right leg pain.  It is up near his groin on the medial aspect of his thigh.  He states over the last 1 week it has been severe but has been ongoing for longer than that.  He cannot quantify but states at least several weeks.  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  Whenever he puts his foot down it causes pain in that area.  No weakness or numbness.  No abdominal pain or testicular/scrotal pain.  No fevers during this time.  He is on Percocet but states that it is not helping.  Pain is severe.  He normally does dialysis today but he skipped because the pain was too severe.  Last dialysis 2 days ago, Thursday.  Past Medical History:  Diagnosis Date  . Anemia   . Anxiety   . AR (allergic rhinitis)   . Arthritis    osteoarthritis  . Blood transfusion   . BPH (benign prostatic hypertrophy)   . Cholelithiasis   . Chronic kidney disease    hx of kidney stones  . Chronic pain   . Diabetes mellitus    insulin dependent  . Displacement of lumbar intervertebral disc without myelopathy    L4-L5  . Diverticulosis of colon   . DVT (deep venous thrombosis) (HCC)    Lower Extremity  . Embolism and thrombosis of unspecified artery ([place])   . Gallstone   . History of nephrolithiasis    Bilateral  . Hyperlipidemia   . Hypertension   . Hypertrophy of prostate   . Hypothyroidism   . Impotence of organic origin   . Morbid obesity ([place])   . Nephrolithiasis   . Other lymphedema   . Pancreatitis, acute   . Peripheral vascular disease ([place])   . Personal history of arthritis    Osteoarthritis  . Sleep apnea    uses cpap  . Unspecified septicemia(038.9) [facility])      Patient Active Problem List   Diagnosis Date Noted  . DDD (degenerative disc disease), lumbosacral   . Morbid obesity ([place])   . Anemia of chronic disease   . Thrombocytopenia ([place])   . Idiopathic chronic venous hypertension of right lower extremity with inflammation   . Lower back pain 12/27/2016  . UTI (urinary tract infection) 12/27/2016  . Non-pressure chronic ulcer of left calf, limited to breakdown of skin ([place]) 02/07/2016  . Hypotension, unspecified 11/24/2015  . Left knee pain 11/24/2015  . Hypotension 11/24/2015  . Decubitus ulcer of buttock 01/22/2015  . S/P BKA (below knee amputation) unilateral ([place]) 01/22/2015  . Chronic venous insufficiency 09/08/2014  . Diabetes mellitus, type 2 ([place]) 09/08/2014  . ESRD on dialysis ([place]) 09/08/2014  . Hyperlipidemia 12/23/2013  . Diabetic foot ([place]) 11/20/2013  . Chronic pain 05/21/2013  . Chronic interstitial cystitis 02/06/2013  . UI (urinary incontinence) 10/29/2012  . CKD (chronic kidney disease) stage 3, GFR 30-59 ml/min (HCC) 08/18/2012  . Cellulitis of left leg 08/14/2012  . Hyperkalemia 08/04/2012  . Hypothyroidism, acquired 08/04/2012  . OBESITY 09/21/2009  . Essential hypertension 09/21/2009  . DIVERTICULOSIS OF  COLON 09/21/2009  . Type 2 diabetes mellitus with diabetic nephropathy, with long-term current use of insulin ([place]) 10/01/2007  . DVT 10/01/2007    Past Surgical History:  Procedure Laterality Date  . AMPUTATION  02/07/2011   Procedure: AMPUTATION BELOW KNEE;  Surgeon: Newt Minion, MD;  Location: [facility];  Service: Orthopedics;  Laterality: Left;  Left Below Knee Amputation  . bilteral hip     replacement & revison  's   . CYSTOSCOPY W/ RETROGRADES Bilateral 10/24/2012   Procedure: CYSTOSCOPY WITH RETROGRADE PYELOGRAM Procedure: Cystoscopy, Bilateral Retrogarde Pyelograms, Bladder Biopsy, Hydrodistension;  Surgeon: Franchot Gallo, MD;  Location: [facility];  Service: Urology;  Laterality: Bilateral;  .  KIDNEY SURGERY    . KNEE SURGERY     left knee    . LEG AMPUTATION BELOW KNEE    . REPLACEMENT TOTAL KNEE    . TONSILLECTOMY         Home Medications    Prior to Admission medications   Medication Sig Start Date End Date Taking? Authorizing Provider  Ascorbic Acid (VITAMIN C PO) Take 1 tablet by mouth 2 (two) times daily.   Yes [provider]  atorvastatin (LIPITOR) 40 MG tablet TAKE 1 TABLET (40 MG TOTAL) BY MOUTH DAILY. 09/13/15  Yes Brunetta Jeans, PA-C  ferrous sulfate 325 (65 FE) MG tablet Take 1 tablet (325 mg total) by mouth 2 (two) times daily. 08/10/14  Yes Brunetta Jeans, PA-C  gabapentin (NEURONTIN) 100 MG capsule TAKE 1 CAPSULE (100 MG TOTAL) BY MOUTH 3 (THREE) TIMES DAILY. 12/26/15  Yes Brunetta Jeans, PA-C  insulin aspart (NOVOLOG) 100 UNIT/ML injection Inject as directed 2 (two) times daily. Sliding scale 09/27/16  Yes [provider]  insulin glargine (LANTUS) 100 UNIT/ML injection Inject 0.28 mLs (28 Units total) at bedtime into the skin. 01/05/17  Yes Nita Sells, MD  levothyroxine (SYNTHROID, LEVOTHROID) 200 MCG tablet Take 1 tablet (200 mcg total) by mouth every morning. Patient taking differently: Take 225 mcg by mouth every morning.  01/10/16  Yes Brunetta Jeans, PA-C  midodrine (PROAMATINE) 5 MG tablet Take 5 mg by mouth. 06/02/16  Yes [provider]  Multiple Vitamins-Minerals (MULTIVITAMINS THER. W/MINERALS) TABS Take 1 tablet by mouth daily.    Yes [provider]  omega-3 acid ethyl esters (LOVAZA) 1 g capsule Take 1 g by mouth 2 (two) times daily.   Yes [provider]  oxyCODONE-acetaminophen (PERCOCET/ROXICET) 5-325 MG tablet Take 1 tablet by mouth 2 (two) times daily as needed for severe pain. 02/01/17  Yes Suzan Slick, NP  sevelamer carbonate (RENVELA) 800 MG tablet Take 800 mg by mouth 3 (three) times daily with meals.   Yes [provider]  clindamycin (CLEOCIN) 300 MG capsule Take 1  capsule (300 mg total) every 8 (eight) hours by mouth. Patient not taking: Reported on 02/24/2017 01/05/17   Nita Sells, MD  collagenase (SANTYL) ointment Apply daily topically. 01/05/17   Nita Sells, MD  oxyCODONE-acetaminophen (PERCOCET/ROXICET) 5-325 MG tablet Take 1 tablet by mouth every 6 (six) hours as needed for severe pain. 02/21/17   Meredith Pel, MD    Family History Family History  Problem Relation Age of Onset  . Diabetes Mother   . Heart attack Mother   . Stroke Mother   . Colon cancer Father   . Dementia Father   . Heart attack Father   . Arthritis Paternal Grandmother   . Hypertension Other   . Hypothyroidism  Other     Social History Social History   Tobacco Use  . Smoking status: Never Smoker  . Smokeless tobacco: Never Used  Substance Use Topics  . Alcohol use: No  . Drug use: No     Allergies   Patient has no known allergies.   Review of Systems Review of Systems  Constitutional: Negative for fever.  Musculoskeletal: Positive for arthralgias (right groin/leg).  Neurological: Negative for weakness and numbness.  All other systems reviewed and are negative.    Physical Exam Updated Vital Signs BP 122/70   Pulse 85   Resp 18   SpO2 96%   Physical Exam  Constitutional: He is oriented to person, place, and time. He appears well-developed and well-nourished.  Morbidly obese  HENT:  Head: Normocephalic and atraumatic.  Right Ear: External ear normal.  Left Ear: External ear normal.  Nose: Nose normal.  Eyes: Right eye exhibits no discharge. Left eye exhibits no discharge.  Neck: Neck supple.  Cardiovascular: Normal rate and regular rhythm.  Pulses:      Dorsalis pedis pulses are 2+ on the right side.  Pulmonary/Chest: Effort normal.  Abdominal: There is no tenderness.  Musculoskeletal: He exhibits no edema.       Right hip: He exhibits tenderness. He exhibits normal range of motion (pain with active, passive ROM).        Right knee: He exhibits normal range of motion and no swelling. No tenderness found.       Right upper leg: He exhibits no tenderness.       Legs: Tenderness along right groin/hip joint. Pain with ROM. No skin changes  Neurological: He is alert and oriented to person, place, and time.  Skin: Skin is warm and dry.  Nursing note and vitals reviewed.    ED Treatments / Results  Labs (all labs ordered are listed, but only abnormal results are displayed) Labs Reviewed  BASIC METABOLIC PANEL - Abnormal; Notable for the following components:      Result Value   Potassium 5.2 (*)    Chloride 100 (*)    Glucose, Bld 153 (*)    BUN 57 (*)    Creatinine, Ser 6.60 (*)    Calcium 7.5 (*)    GFR calc non Af Amer 8 (*)    GFR calc Af Amer 10 (*)    All other components within normal limits  CBC WITH DIFFERENTIAL/PLATELET - Abnormal; Notable for the following components:   RBC 3.18 (*)    Hemoglobin 9.5 (*)    HCT 31.4 (*)    RDW 17.1 (*)    Platelets 145 (*)    All other components within normal limits    EKG  EKG Interpretation None       Radiology Dg Hip Unilat W Or Wo Pelvis 2-3 Views Right  Result Date: 02/24/2017 CLINICAL DATA:  Worsening right hip pain for several days. No known injury. EXAM: DG HIP (WITH OR WITHOUT PELVIS) 2-3V RIGHT COMPARISON:  None. FINDINGS: Technically suboptimal due to large patient habitus. Bipolar right hip prosthesis is seen as well as femoral fixation plate and screws. No evidence of fracture or dislocation. Left hip prosthesis also noted. IMPRESSION: No acute findings. Electronically Signed   By: Earle Gell M.D.   On: 02/24/2017 13:57   Dg Femur Min 2 Views Right  Result Date: 02/24/2017 CLINICAL DATA:  Right femur pain for several days.  No known injury. EXAM: RIGHT FEMUR 2 VIEWS COMPARISON:  12/20/2016 FINDINGS:  Right hip prosthesis is seen as well as internal fixation plate and screws in the mid and distal femur. Comminuted fracture of  the distal femoral metaphysis is seen showing increased sclerosis, consistent with partial interval healing. Mild medial displacement and anterior angulation of the distal fracture fragment appears stable. Generalized osteopenia noted as well as extensive peripheral vascular calcification. IMPRESSION: No acute findings. Partial interval healing of comminuted fracture of distal femoral metaphysis. Stable mild medial displacement and anterior angulation of distal fracture fragment. Electronically Signed   By: Earle Gell M.D.   On: 02/24/2017 14:00   *Preliminary Results* Right lower extremity venous duplex completed. There is no obvious evidence of deep vein thrombosis involving visualized veins of the right lower extremity. There is no evidence of right Baker's cyst.  02/24/2017 4:21 PM  Maudry Mayhew, BS, RVT, RDCS, RDMS            Electronically signed by Maudry Mayhew A at 02/24/2017 4:21 PM    Procedures Procedures (including critical care time)  Medications Ordered in ED Medications  oxyCODONE-acetaminophen (PERCOCET/ROXICET) 5-325 MG per tablet 1 tablet (not administered)  HYDROmorphone (DILAUDID) injection 1 mg (1 mg Intravenous Given 02/24/17 1343)     Initial Impression / Assessment and Plan / ED Course  I have reviewed the triage vital signs and the nursing notes.  Pertinent labs & imaging results that were available during my care of the patient were reviewed by me and considered in my medical decision making (see chart for details).     Patient with worsening acute on chronic pain in right hip. Pain improved. No fevers, his neurovascular status is stable. No signs of fracture/dislocation. Doubt infection. DVT u/s negative. Appears stable to f/u with his orthopedist as outpatient like scheduled. Encouraged to call dialysis to see if he can get in tomorrow, or at worst Monday (2 days). K mildly high at 5.2, stable at this time for discharge.   Final Clinical  Impressions(s) / ED Diagnoses   Final diagnoses:  Right hip pain    ED Discharge Orders    None       Sherwood Gambler, MD 02/24/17 [phone]

## 2017-02-24 NOTE — ED Triage Notes (Signed)
Patient arrived from home via [facility] for right leg/groin pain that has gradually got worse a few days ago and severely worse since yesterday. Hx. Of right femoral surgey with plate. Didn't get dialysis this morning because of the pain. Hx. DM.

## 2017-02-24 NOTE — Progress Notes (Signed)
*  Preliminary Results* Right lower extremity venous duplex completed. There is no obvious evidence of deep vein thrombosis involving visualized veins of the right lower extremity. There is no evidence of right Baker's cyst.  02/24/2017 4:21 PM  Maudry Mayhew, BS, RVT, RDCS, RDMS

## 2017-02-26 ENCOUNTER — Ambulatory Visit (INDEPENDENT_AMBULATORY_CARE_PROVIDER_SITE_OTHER): Payer: Medicare Other | Admitting: Orthopedic Surgery

## 2017-02-26 NOTE — Telephone Encounter (Signed)
Faxed to [facility] [phone] for beasy board

## 2017-03-05 ENCOUNTER — Ambulatory Visit (INDEPENDENT_AMBULATORY_CARE_PROVIDER_SITE_OTHER): Payer: Medicare Other | Admitting: Orthopedic Surgery

## 2017-03-05 ENCOUNTER — Encounter (INDEPENDENT_AMBULATORY_CARE_PROVIDER_SITE_OTHER): Payer: Self-pay | Admitting: Orthopedic Surgery

## 2017-03-05 DIAGNOSIS — S72491D Other fracture of lower end of right femur, subsequent encounter for closed fracture with routine healing: Secondary | ICD-10-CM

## 2017-03-05 DIAGNOSIS — S72002S Fracture of unspecified part of neck of left femur, sequela: Secondary | ICD-10-CM | POA: Diagnosis not present

## 2017-03-05 DIAGNOSIS — M86262 Subacute osteomyelitis, left tibia and fibula: Secondary | ICD-10-CM

## 2017-03-05 DIAGNOSIS — S72401D Unspecified fracture of lower end of right femur, subsequent encounter for closed fracture with routine healing: Secondary | ICD-10-CM

## 2017-03-05 HISTORY — DX: Fracture of unspecified part of neck of left femur, sequela: S72.002S

## 2017-03-05 HISTORY — DX: Subacute osteomyelitis, left tibia and fibula: M86.262

## 2017-03-05 NOTE — Progress Notes (Signed)
Office Visit Note   Patient: [name]           Date of Birth: [birth year]           MRN: [id number] Visit Date: 03/05/2017              Requested by: No referring provider defined for this encounter. PCP: Patient, No Pcp Per  Chief Complaint  Patient presents with  . Left Leg - Follow-up  . Right Leg - Follow-up      HPI: Patient presents for 2 separate issues #1 status post internal fixation for periprosthetic right femur fracture he is also status post closed fracture periprosthetic of the left hip and states that during dialysis they noticed purulent drainage from his left transtibial amputation.  Patient states that he recently has had problems with sciatic nerve pain went to the emergency room but he states that he did not pay attention to what he had to say.  Patient states that he requires a refill prescription for his oxycodone for his sciatic pain.  Patient does have end-stage renal disease on dialysis Tuesday Thursday Saturday.  Patient lives at home he is total assist with a Harrel Lemon lift he states that the therapy was stopped due to failure of progression.  Assessment & Plan: Visit Diagnoses:  1. Other closed fracture of distal end of right femur with routine healing, subsequent encounter   2. Traumatic closed nondisplaced fracture of neck of femur, left, sequela   3. Subacute osteomyelitis of left tibia (HCC)     Plan: Due to patient's purulent drainage from the left transtibial amputation and the fact that this communicates to the stem of the tibial component would require a above-the-knee amputation to correct the infection.  There is no modification to the transtibial amputation available due to the fact that the stemmed tibial component extends down to the amputation site.  Patient does have a total hip with a periprosthetic fracture around the left total hip arthroplasty and has a stemmed femoral component as well.  Will need to proceed with a above-the-knee  amputation between the stems of the femoral and total knee components.  Patient will need discharge to skilled nursing.  Patient also has a decubitus heel ulcer on the right and we will place him in a [facility] for this.  Follow-Up Instructions: Return in about 3 weeks (around 03/26/2017).   Ortho Exam  Patient is alert, oriented, no adenopathy, well-dressed, normal affect, normal respiratory effort. Examination patient has purulent drainage from the left transtibial amputation this is probed the probes all the way down to the distal tibia and to the lateral of the stemmed tibial component.  Patient has a flexion contracture of the left knee and essentially no range of motion of the left total knee arthroplasty.  Examination the right heel patient has a decubitus ulcer which is 10 mm in diameter and 1 mm deep has good granulation tissue is currently wearing a shoe which is applying pressure to this ulcer.  Imaging: No results found. No images are attached to the encounter.  Labs: Lab Results  Component Value Date   HGBA1C 5.3 03/20/2016   HGBA1C 5.3 04/29/2015   HGBA1C 6.5 (A) 12/24/2013   ESRSEDRATE 67 (H) 12/31/2016   ESRSEDRATE 52 (H) 12/27/2016   ESRSEDRATE 57 (H) 06/13/2010   CRP 4.3 (H) 12/31/2016   CRP 10.4 (H) 12/27/2016   CRP 4.8 (H) 04/13/202011   LABURIC 8.3 (H) 11/25/2015   LABURIC 6.2 10/10/2008  LABURIC 5.6 03/10/2008   REPTSTATUS 01/02/2017 FINAL 12/28/2016   GRAMSTAIN  08/16/2012    NO WBC SEEN MODERATE SQUAMOUS EPITHELIAL CELLS PRESENT MODERATE YEAST   CULT NO GROWTH 5 DAYS 12/28/2016   LABORGA ESCHERICHIA COLI (A) 12/27/2016    @LABSALLVALUES (HGBA1)@  There is no height or weight on file to calculate BMI.  Orders:  No orders of the defined types were placed in this encounter.  No orders of the defined types were placed in this encounter.    Procedures: No procedures performed  Clinical Data: No additional findings.  ROS:  All other systems  negative, except as noted in the HPI. Review of Systems  Objective: Vital Signs: There were no vitals taken for this visit.  Specialty Comments:  No specialty comments available.  PMFS History: Patient Active Problem List   Diagnosis Date Noted  . Subacute osteomyelitis of left tibia ([place]) 03/05/2017  . Traumatic closed nondisplaced fracture of neck of femur, left, sequela 03/05/2017  . Closed fracture of lower end of right femur with routine healing 03/05/2017  . DDD (degenerative disc disease), lumbosacral   . Morbid obesity ([place])   . Anemia of chronic disease   . Thrombocytopenia ([place])   . Idiopathic chronic venous hypertension of right lower extremity with inflammation   . Lower back pain 12/27/2016  . UTI (urinary tract infection) 12/27/2016  . Non-pressure chronic ulcer of left calf, limited to breakdown of skin ([place]) 02/07/2016  . Hypotension, unspecified 11/24/2015  . Left knee pain 11/24/2015  . Hypotension 11/24/2015  . Decubitus ulcer of buttock 01/22/2015  . S/P BKA (below knee amputation) unilateral ([place]) 01/22/2015  . Chronic venous insufficiency 09/08/2014  . Diabetes mellitus, type 2 ([place]) 09/08/2014  . ESRD on dialysis ([place]) 09/08/2014  . Hyperlipidemia 12/23/2013  . Diabetic foot ([place]) 11/20/2013  . Chronic pain 05/21/2013  . Chronic interstitial cystitis 02/06/2013  . UI (urinary incontinence) 10/29/2012  . CKD (chronic kidney disease) stage 3, GFR 30-59 ml/min (HCC) 08/18/2012  . Cellulitis of left leg 08/14/2012  . Hyperkalemia 08/04/2012  . Hypothyroidism, acquired 08/04/2012  . OBESITY 09/21/2009  . Essential hypertension 09/21/2009  . DIVERTICULOSIS OF COLON 09/21/2009  . Type 2 diabetes mellitus with diabetic nephropathy, with long-term current use of insulin ([place]) 10/01/2007  . DVT 10/01/2007   Past Medical History:  Diagnosis Date  . Anemia   . Anxiety   . AR (allergic rhinitis)   . Arthritis    osteoarthritis  . Blood transfusion   .  BPH (benign prostatic hypertrophy)   . Cholelithiasis   . Chronic kidney disease    hx of kidney stones  . Chronic pain   . Diabetes mellitus    insulin dependent  . Displacement of lumbar intervertebral disc without myelopathy    L4-L5  . Diverticulosis of colon   . DVT (deep venous thrombosis) (HCC)    Lower Extremity  . Embolism and thrombosis of unspecified artery ([place])   . Gallstone   . History of nephrolithiasis    Bilateral  . Hyperlipidemia   . Hypertension   . Hypertrophy of prostate   . Hypothyroidism   . Impotence of organic origin   . Morbid obesity ([place])   . Nephrolithiasis   . Other lymphedema   . Pancreatitis, acute   . Peripheral vascular disease ([place])   . Personal history of arthritis    Osteoarthritis  . Sleep apnea    uses cpap  . Unspecified septicemia(038.9) ([place])  Family History  Problem Relation Age of Onset  . Diabetes Mother   . Heart attack Mother   . Stroke Mother   . Colon cancer Father   . Dementia Father   . Heart attack Father   . Arthritis Paternal Grandmother   . Hypertension Other   . Hypothyroidism Other     Past Surgical History:  Procedure Laterality Date  . AMPUTATION  02/07/2011   Procedure: AMPUTATION BELOW KNEE;  Surgeon: Newt Minion, MD;  Location: [place];  Service: Orthopedics;  Laterality: Left;  Left Below Knee Amputation  . bilteral hip     replacement & revison  's   . CYSTOSCOPY W/ RETROGRADES Bilateral 10/24/2012   Procedure: CYSTOSCOPY WITH RETROGRADE PYELOGRAM Procedure: Cystoscopy, Bilateral Retrogarde Pyelograms, Bladder Biopsy, Hydrodistension;  Surgeon: Franchot Gallo, MD;  [facility];  Service: Urology;  Laterality: Bilateral;  . KIDNEY SURGERY    . KNEE SURGERY     left knee    . LEG AMPUTATION BELOW KNEE    . REPLACEMENT TOTAL KNEE    . TONSILLECTOMY     Social History   Occupational History  . Not on file  Tobacco Use  . Smoking status: Never Smoker  . Smokeless tobacco: Never Used    Substance and Sexual Activity  . Alcohol use: No  . Drug use: No  . Sexual activity: Yes

## 2017-03-07 ENCOUNTER — Other Ambulatory Visit (INDEPENDENT_AMBULATORY_CARE_PROVIDER_SITE_OTHER): Payer: Self-pay | Admitting: Family

## 2017-03-08 ENCOUNTER — Encounter (HOSPITAL_COMMUNITY): Payer: Self-pay | Admitting: Vascular Surgery

## 2017-03-08 ENCOUNTER — Other Ambulatory Visit: Payer: Self-pay

## 2017-03-08 NOTE — Progress Notes (Signed)
Anesthesia Chart Review: Patient is a 62 year old male scheduled for left AKA on 03/09/17 by Dr. Meridee Score.  History includes never smoker, OSA (CPAP), ESRD (hemodialysis TTS), hypothyroidism, anemia, HTN, HLD, IDDM, PVD (s/p left BKA 02/07/11), BPH, chronic pain, DVT, lymphedema, pancreatitis, anxiety, arthritis, tonsillectomy, bilateral THA '04 (s/p I&D left 12/03/09 for infection), left TKA (infected, s/p removal replacement TKA 04/26/10, revision 06/03/10), right distal femur fracture 08/2016.   - Admitted 12/27/16-01/05/17. Low back pain with chronic L5 vertebral body compression fracture. Complicated (E. Coli) UTI. Left BKA cellulitis.    No PCP is listed. In Care Everywhere, there are several notes from providers/specialists in [facility].   Meds include Lipitor, ferrous sulfate, Neurontin, Novolog, insulin glargine, levothyroxine, midodrine, Renvela.  EKG 11/24/15: SR, PACs, first degree AV block, low voltage. If he has not had a more recent EKG then plan for updated EKG on the day of surgery.  Echo 11/30/15: Study Conclusions - Left ventricle: The cavity size was normal. Systolic function was   normal. The estimated ejection fraction was in the range of 60%   to 65%. Wall motion was normal; there were no regional wall   motion abnormalities. Features are consistent with a pseudonormal   left ventricular filling pattern, with concomitant abnormal   relaxation and increased filling pressure (grade 2 diastolic   dysfunction). Doppler parameters are consistent with high   ventricular filling pressure. - Aortic valve: Trileaflet; moderately thickened, moderately   calcified leaflets. Valve mobility was restricted. - Aorta: Aortic root dimension: 40 mm (ED). - Aortic root: The aortic root was mildly dilated. - Mitral valve: Calcified annulus. - Atrial septum: There was increased thickness of the septum,   consistent with lipomatous hypertrophy.  In Care Everywhere there are "Cardiac  Catheterization"/"Cath/Vascular Procedure" encounters from 2016. Although I cannot review reports, records suggest there were actually vascular studies, related to either PVD or hemodialysis. I cannot be certain without records, however.   Labs from 02/24/17 noted. H/H 9.5/31.4. PLT 145. K 5.2. Cr 6.60. Glucose 153.  Patient is a same day work-up, so further evaluation by his anesthesiologist on the day of surgery.  George Hugh [facility] Short Stay Center/Anesthesiology Phone [phone] 03/08/2017 4:55 PM

## 2017-03-08 NOTE — Progress Notes (Signed)
Pt denies SOB, chest pain, and being under the care of a cardiologist. Pt denies having a stress test. Pt made aware to stop taking  vitamins, fish oil and herbal medications. Do not take any NSAIDs ie: Ibuprofen, Advil, Naproxen (Aleve), Motrin, BC and Goody Powder. Pt made aware to take half dose of Basaglar insulin tonight (15 units). Pt made aware to check BG every 2 hours prior to arrival to hospital on DOS. Pt made aware to treat a BG < 70 with 4 ounces of apple  juice, wait 15 minutes after intervention to recheck BG, if BG remains < 70, call Short Stay unit to speak with a nurse. Pt made aware to take half of sliding scale correction dose for BG > 220. Pt verbalized understanding of all pre-op instructions.

## 2017-03-09 ENCOUNTER — Inpatient Hospital Stay (HOSPITAL_COMMUNITY): Payer: Medicare Other | Admitting: Certified Registered Nurse Anesthetist

## 2017-03-09 ENCOUNTER — Inpatient Hospital Stay (HOSPITAL_COMMUNITY)
Admission: RE | Admit: 2017-03-09 | Discharge: 2017-03-14 | DRG: 474 | Disposition: A | Discharge status: Skilled Nursing Facility | Payer: Medicare Other | Source: Ambulatory Visit | Attending: Orthopedic Surgery | Admitting: Orthopedic Surgery

## 2017-03-09 ENCOUNTER — Other Ambulatory Visit: Payer: Self-pay

## 2017-03-09 ENCOUNTER — Encounter (HOSPITAL_COMMUNITY)
Admission: RE | Disposition: A | Discharge status: Skilled Nursing Facility | Payer: Self-pay | Source: Ambulatory Visit | Attending: Orthopedic Surgery

## 2017-03-09 DIAGNOSIS — Z6835 Body mass index (BMI) 35.0-35.9, adult: Secondary | ICD-10-CM

## 2017-03-09 DIAGNOSIS — E1122 Type 2 diabetes mellitus with diabetic chronic kidney disease: Secondary | ICD-10-CM | POA: Diagnosis present

## 2017-03-09 DIAGNOSIS — Z800 Family history of malignant neoplasm of digestive organs: Secondary | ICD-10-CM

## 2017-03-09 DIAGNOSIS — M86262 Subacute osteomyelitis, left tibia and fibula: Secondary | ICD-10-CM | POA: Diagnosis present

## 2017-03-09 DIAGNOSIS — G4730 Sleep apnea, unspecified: Secondary | ICD-10-CM | POA: Diagnosis present

## 2017-03-09 DIAGNOSIS — Z96643 Presence of artificial hip joint, bilateral: Secondary | ICD-10-CM | POA: Diagnosis present

## 2017-03-09 DIAGNOSIS — Z89512 Acquired absence of left leg below knee: Secondary | ICD-10-CM

## 2017-03-09 DIAGNOSIS — N186 End stage renal disease: Secondary | ICD-10-CM | POA: Diagnosis present

## 2017-03-09 DIAGNOSIS — S72401D Unspecified fracture of lower end of right femur, subsequent encounter for closed fracture with routine healing: Secondary | ICD-10-CM

## 2017-03-09 DIAGNOSIS — M009 Pyogenic arthritis, unspecified: Secondary | ICD-10-CM | POA: Diagnosis present

## 2017-03-09 DIAGNOSIS — M898X9 Other specified disorders of bone, unspecified site: Secondary | ICD-10-CM | POA: Diagnosis present

## 2017-03-09 DIAGNOSIS — D631 Anemia in chronic kidney disease: Secondary | ICD-10-CM | POA: Diagnosis present

## 2017-03-09 DIAGNOSIS — S72002S Fracture of unspecified part of neck of left femur, sequela: Secondary | ICD-10-CM

## 2017-03-09 DIAGNOSIS — L8990 Pressure ulcer of unspecified site, unspecified stage: Secondary | ICD-10-CM

## 2017-03-09 DIAGNOSIS — I9589 Other hypotension: Secondary | ICD-10-CM | POA: Diagnosis present

## 2017-03-09 DIAGNOSIS — Y831 Surgical operation with implant of artificial internal device as the cause of abnormal reaction of the patient, or of later complication, without mention of misadventure at the time of the procedure: Secondary | ICD-10-CM | POA: Diagnosis not present

## 2017-03-09 DIAGNOSIS — T8781 Dehiscence of amputation stump: Secondary | ICD-10-CM | POA: Diagnosis present

## 2017-03-09 DIAGNOSIS — E785 Hyperlipidemia, unspecified: Secondary | ICD-10-CM | POA: Diagnosis present

## 2017-03-09 DIAGNOSIS — L89153 Pressure ulcer of sacral region, stage 3: Secondary | ICD-10-CM | POA: Diagnosis present

## 2017-03-09 DIAGNOSIS — Z992 Dependence on renal dialysis: Secondary | ICD-10-CM

## 2017-03-09 DIAGNOSIS — I120 Hypertensive chronic kidney disease with stage 5 chronic kidney disease or end stage renal disease: Secondary | ICD-10-CM | POA: Diagnosis present

## 2017-03-09 DIAGNOSIS — Z8739 Personal history of other diseases of the musculoskeletal system and connective tissue: Secondary | ICD-10-CM

## 2017-03-09 DIAGNOSIS — D62 Acute posthemorrhagic anemia: Secondary | ICD-10-CM | POA: Diagnosis not present

## 2017-03-09 DIAGNOSIS — E1169 Type 2 diabetes mellitus with other specified complication: Secondary | ICD-10-CM | POA: Diagnosis present

## 2017-03-09 DIAGNOSIS — Z794 Long term (current) use of insulin: Secondary | ICD-10-CM | POA: Diagnosis not present

## 2017-03-09 DIAGNOSIS — N2581 Secondary hyperparathyroidism of renal origin: Secondary | ICD-10-CM | POA: Diagnosis present

## 2017-03-09 DIAGNOSIS — Z8249 Family history of ischemic heart disease and other diseases of the circulatory system: Secondary | ICD-10-CM

## 2017-03-09 DIAGNOSIS — T8744 Infection of amputation stump, left lower extremity: Secondary | ICD-10-CM | POA: Diagnosis present

## 2017-03-09 DIAGNOSIS — T8454XA Infection and inflammatory reaction due to internal left knee prosthesis, initial encounter: Principal | ICD-10-CM | POA: Diagnosis present

## 2017-03-09 DIAGNOSIS — Z833 Family history of diabetes mellitus: Secondary | ICD-10-CM

## 2017-03-09 DIAGNOSIS — Z89612 Acquired absence of left leg above knee: Secondary | ICD-10-CM

## 2017-03-09 DIAGNOSIS — E1151 Type 2 diabetes mellitus with diabetic peripheral angiopathy without gangrene: Secondary | ICD-10-CM | POA: Diagnosis present

## 2017-03-09 DIAGNOSIS — I890 Lymphedema, not elsewhere classified: Secondary | ICD-10-CM | POA: Diagnosis present

## 2017-03-09 DIAGNOSIS — E039 Hypothyroidism, unspecified: Secondary | ICD-10-CM | POA: Diagnosis present

## 2017-03-09 DIAGNOSIS — F419 Anxiety disorder, unspecified: Secondary | ICD-10-CM | POA: Diagnosis present

## 2017-03-09 DIAGNOSIS — Z936 Other artificial openings of urinary tract status: Secondary | ICD-10-CM

## 2017-03-09 DIAGNOSIS — G8929 Other chronic pain: Secondary | ICD-10-CM | POA: Diagnosis present

## 2017-03-09 HISTORY — DX: End stage renal disease: N18.6

## 2017-03-09 HISTORY — PX: AMPUTATION: SHX166

## 2017-03-09 HISTORY — DX: Osteomyelitis, unspecified: M86.9

## 2017-03-09 LAB — GLUCOSE, CAPILLARY
GLUCOSE-CAPILLARY: 124 mg/dL — AB (ref 65–99)
GLUCOSE-CAPILLARY: 114 mg/dL — AB (ref 65–99)
GLUCOSE-CAPILLARY: 117 mg/dL — AB (ref 65–99)
Glucose-Capillary: 105 mg/dL — ABNORMAL HIGH (ref 65–99)

## 2017-03-09 LAB — HEMOGLOBIN A1C
Hgb A1c MFr Bld: 5.1 % (ref 4.8–5.6)
Mean Plasma Glucose: 99.67 mg/dL

## 2017-03-09 LAB — MRSA PCR SCREENING: MRSA by PCR: NEGATIVE

## 2017-03-09 LAB — POCT I-STAT, CHEM 8
BUN: 32 mg/dL — ABNORMAL HIGH (ref 6–20)
CHLORIDE: 103 mmol/L (ref 101–111)
CREATININE: 4.8 mg/dL — AB (ref 0.61–1.24)
Calcium, Ion: 0.95 mmol/L — ABNORMAL LOW (ref 1.15–1.40)
GLUCOSE: 133 mg/dL — AB (ref 65–99)
HCT: 30 % — ABNORMAL LOW (ref 39.0–52.0)
Hemoglobin: 10.2 g/dL — ABNORMAL LOW (ref 13.0–17.0)
Potassium: 4.3 mmol/L (ref 3.5–5.1)
Sodium: 140 mmol/L (ref 135–145)
TCO2: 24 mmol/L (ref 22–32)

## 2017-03-09 SURGERY — AMPUTATION, ABOVE KNEE
Anesthesia: General | Laterality: Left

## 2017-03-09 MED ORDER — FENTANYL CITRATE (PF) 100 MCG/2ML IJ SOLN
25.0000 ug | INTRAMUSCULAR | Status: DC | PRN
  Administered 2017-03-09: 25 ug via INTRAVENOUS

## 2017-03-09 MED ORDER — LACTATED RINGERS IV SOLN
INTRAVENOUS | Status: DC | PRN
  Administered 2017-03-09: 07:00:00 via INTRAVENOUS

## 2017-03-09 MED ORDER — SUCCINYLCHOLINE CHLORIDE 200 MG/10ML IV SOSY
PREFILLED_SYRINGE | INTRAVENOUS | Status: AC
  Filled 2017-03-09: qty 10

## 2017-03-09 MED ORDER — METHOCARBAMOL 1000 MG/10ML IJ SOLN
500.0000 mg | Freq: Four times a day (QID) | INTRAVENOUS | Status: DC | PRN
  Filled 2017-03-09: qty 5

## 2017-03-09 MED ORDER — GABAPENTIN 100 MG PO CAPS
100.0000 mg | ORAL_CAPSULE | Freq: Three times a day (TID) | ORAL | Status: DC
  Administered 2017-03-09 – 2017-03-14 (×13): 100 mg via ORAL
  Filled 2017-03-09 (×13): qty 1

## 2017-03-09 MED ORDER — CEFAZOLIN SODIUM-DEXTROSE 2-4 GM/100ML-% IV SOLN
INTRAVENOUS | Status: AC
  Filled 2017-03-09: qty 100

## 2017-03-09 MED ORDER — OXYCODONE HCL 5 MG/5ML PO SOLN: 5.0000 mg | Freq: Once | ORAL | Status: DC | PRN

## 2017-03-09 MED ORDER — SEVELAMER CARBONATE 800 MG PO TABS: 800.0000 mg | ORAL_TABLET | ORAL | Status: DC | PRN

## 2017-03-09 MED ORDER — ROCURONIUM BROMIDE 10 MG/ML (PF) SYRINGE
PREFILLED_SYRINGE | INTRAVENOUS | Status: AC
  Filled 2017-03-09: qty 5

## 2017-03-09 MED ORDER — PROPOFOL 10 MG/ML IV BOLUS
INTRAVENOUS | Status: AC
Start: 2017-03-09 — End: 2017-03-09
  Filled 2017-03-09: qty 20

## 2017-03-09 MED ORDER — INSULIN GLARGINE 100 UNIT/ML ~~LOC~~ SOLN
20.0000 [IU] | Freq: Every day | SUBCUTANEOUS | Status: DC
  Administered 2017-03-09 – 2017-03-13 (×5): 20 [IU] via SUBCUTANEOUS
  Filled 2017-03-09 (×6): qty 0.2

## 2017-03-09 MED ORDER — CEFAZOLIN SODIUM-DEXTROSE 1-4 GM/50ML-% IV SOLN
1.0000 g | Freq: Once | INTRAVENOUS | Status: AC
Start: 2017-03-10 — End: 2017-03-10
  Administered 2017-03-10: 1 g via INTRAVENOUS
  Filled 2017-03-09 (×2): qty 50

## 2017-03-09 MED ORDER — ATORVASTATIN CALCIUM 40 MG PO TABS
40.0000 mg | ORAL_TABLET | Freq: Every day | ORAL | Status: DC
  Administered 2017-03-09 – 2017-03-14 (×4): 40 mg via ORAL
  Filled 2017-03-09 (×4): qty 1

## 2017-03-09 MED ORDER — OXYCODONE HCL 5 MG PO TABS: 5.0000 mg | ORAL_TABLET | ORAL | Status: DC | PRN

## 2017-03-09 MED ORDER — SEVELAMER CARBONATE 800 MG PO TABS
1600.0000 mg | ORAL_TABLET | Freq: Three times a day (TID) | ORAL | Status: DC
  Administered 2017-03-09 – 2017-03-14 (×10): 1600 mg via ORAL
  Filled 2017-03-09 (×12): qty 2

## 2017-03-09 MED ORDER — ONDANSETRON HCL 4 MG/2ML IJ SOLN: 4.0000 mg | Freq: Once | INTRAMUSCULAR | Status: DC | PRN

## 2017-03-09 MED ORDER — ASPIRIN EC 325 MG PO TBEC
325.0000 mg | DELAYED_RELEASE_TABLET | Freq: Every day | ORAL | Status: DC
  Administered 2017-03-11 – 2017-03-14 (×3): 325 mg via ORAL
  Filled 2017-03-09 (×3): qty 1

## 2017-03-09 MED ORDER — LIDOCAINE 2% (20 MG/ML) 5 ML SYRINGE
INTRAMUSCULAR | Status: AC
  Filled 2017-03-09: qty 5

## 2017-03-09 MED ORDER — MIDAZOLAM HCL 2 MG/2ML IJ SOLN
INTRAMUSCULAR | Status: AC
  Filled 2017-03-09: qty 2

## 2017-03-09 MED ORDER — ALBUMIN HUMAN 5 % IV SOLN
12.5000 g | Freq: Once | INTRAVENOUS | Status: AC
  Administered 2017-03-09: 12.5 g via INTRAVENOUS

## 2017-03-09 MED ORDER — CHLORHEXIDINE GLUCONATE 4 % EX LIQD: 60.0000 mL | Freq: Once | CUTANEOUS | Status: DC

## 2017-03-09 MED ORDER — METOCLOPRAMIDE HCL 5 MG/ML IJ SOLN: 5.0000 mg | Freq: Three times a day (TID) | INTRAMUSCULAR | Status: DC | PRN

## 2017-03-09 MED ORDER — PROPOFOL 10 MG/ML IV BOLUS
INTRAVENOUS | Status: AC
  Filled 2017-03-09: qty 20

## 2017-03-09 MED ORDER — LEVOTHYROXINE SODIUM 75 MCG PO TABS
225.0000 ug | ORAL_TABLET | Freq: Every day | ORAL | Status: DC
  Administered 2017-03-10 – 2017-03-14 (×4): 225 ug via ORAL
  Filled 2017-03-09: qty 1
  Filled 2017-03-09: qty 9
  Filled 2017-03-09 (×2): qty 3
  Filled 2017-03-09: qty 2

## 2017-03-09 MED ORDER — ALBUMIN HUMAN 5 % IV SOLN
INTRAVENOUS | Status: AC
  Administered 2017-03-09: 12.5 g via INTRAVENOUS
  Filled 2017-03-09: qty 250

## 2017-03-09 MED ORDER — FENTANYL CITRATE (PF) 250 MCG/5ML IJ SOLN
INTRAMUSCULAR | Status: AC
  Filled 2017-03-09: qty 5

## 2017-03-09 MED ORDER — OXYCODONE HCL 5 MG PO TABS: 5.0000 mg | ORAL_TABLET | Freq: Once | ORAL | Status: DC | PRN

## 2017-03-09 MED ORDER — SUGAMMADEX SODIUM 200 MG/2ML IV SOLN
INTRAVENOUS | Status: AC
  Filled 2017-03-09: qty 2

## 2017-03-09 MED ORDER — ACETAMINOPHEN 650 MG RE SUPP: 650.0000 mg | RECTAL | Status: DC | PRN

## 2017-03-09 MED ORDER — INSULIN ASPART 100 UNIT/ML ~~LOC~~ SOLN
0.0000 [IU] | Freq: Three times a day (TID) | SUBCUTANEOUS | Status: DC
  Administered 2017-03-10: 3 [IU] via SUBCUTANEOUS
  Administered 2017-03-10 – 2017-03-11 (×3): 1 [IU] via SUBCUTANEOUS
  Administered 2017-03-11: 2 [IU] via SUBCUTANEOUS
  Administered 2017-03-12: 1 [IU] via SUBCUTANEOUS
  Administered 2017-03-12 – 2017-03-14 (×2): 2 [IU] via SUBCUTANEOUS
  Administered 2017-03-14: 1 [IU] via SUBCUTANEOUS

## 2017-03-09 MED ORDER — MIDODRINE HCL 5 MG PO TABS
5.0000 mg | ORAL_TABLET | Freq: Three times a day (TID) | ORAL | Status: DC
  Administered 2017-03-09 – 2017-03-11 (×5): 5 mg via ORAL
  Filled 2017-03-09 (×6): qty 1

## 2017-03-09 MED ORDER — CEFAZOLIN SODIUM-DEXTROSE 2-4 GM/100ML-% IV SOLN
2.0000 g | INTRAVENOUS | Status: AC
  Administered 2017-03-09: 2 g via INTRAVENOUS

## 2017-03-09 MED ORDER — ACETAMINOPHEN 325 MG PO TABS
650.0000 mg | ORAL_TABLET | ORAL | Status: DC | PRN
  Administered 2017-03-13: 650 mg via ORAL

## 2017-03-09 MED ORDER — ONDANSETRON HCL 4 MG/2ML IJ SOLN
INTRAMUSCULAR | Status: AC
  Filled 2017-03-09: qty 2

## 2017-03-09 MED ORDER — PROPOFOL 10 MG/ML IV BOLUS
INTRAVENOUS | Status: DC | PRN
  Administered 2017-03-09: 150 mg via INTRAVENOUS

## 2017-03-09 MED ORDER — FENTANYL CITRATE (PF) 100 MCG/2ML IJ SOLN
INTRAMUSCULAR | Status: AC
  Administered 2017-03-09: 25 ug via INTRAVENOUS
  Filled 2017-03-09: qty 2

## 2017-03-09 MED ORDER — CEFAZOLIN SODIUM-DEXTROSE 2-4 GM/100ML-% IV SOLN: 2.0000 g | Freq: Four times a day (QID) | INTRAVENOUS | Status: DC

## 2017-03-09 MED ORDER — METHOCARBAMOL 500 MG PO TABS
500.0000 mg | ORAL_TABLET | Freq: Four times a day (QID) | ORAL | Status: DC | PRN
  Administered 2017-03-09 – 2017-03-14 (×6): 500 mg via ORAL
  Filled 2017-03-09 (×6): qty 1

## 2017-03-09 MED ORDER — SODIUM CHLORIDE 0.9 % IV SOLN: INTRAVENOUS | Status: DC

## 2017-03-09 MED ORDER — LIDOCAINE HCL (CARDIAC) 20 MG/ML IV SOLN
INTRAVENOUS | Status: DC | PRN
  Administered 2017-03-09: 40 mg via INTRATRACHEAL

## 2017-03-09 MED ORDER — PHENYLEPHRINE HCL 10 MG/ML IJ SOLN
INTRAMUSCULAR | Status: DC | PRN
  Administered 2017-03-09: 80 ug via INTRAVENOUS
  Administered 2017-03-09: 120 ug via INTRAVENOUS
  Administered 2017-03-09 (×2): 80 ug via INTRAVENOUS

## 2017-03-09 MED ORDER — MAGNESIUM CITRATE PO SOLN: 1.0000 | Freq: Once | ORAL | Status: DC | PRN

## 2017-03-09 MED ORDER — ONDANSETRON HCL 4 MG PO TABS: 4.0000 mg | ORAL_TABLET | Freq: Four times a day (QID) | ORAL | Status: DC | PRN

## 2017-03-09 MED ORDER — DOCUSATE SODIUM 100 MG PO CAPS
100.0000 mg | ORAL_CAPSULE | Freq: Two times a day (BID) | ORAL | Status: DC
  Administered 2017-03-09 – 2017-03-12 (×3): 100 mg via ORAL
  Filled 2017-03-09 (×9): qty 1

## 2017-03-09 MED ORDER — DEXAMETHASONE SODIUM PHOSPHATE 10 MG/ML IJ SOLN
INTRAMUSCULAR | Status: AC
  Filled 2017-03-09: qty 1

## 2017-03-09 MED ORDER — HYDROMORPHONE HCL 1 MG/ML IJ SOLN
1.0000 mg | INTRAMUSCULAR | Status: DC | PRN
  Administered 2017-03-09 – 2017-03-13 (×10): 1 mg via INTRAVENOUS
  Filled 2017-03-09 (×11): qty 1

## 2017-03-09 MED ORDER — FENTANYL CITRATE (PF) 100 MCG/2ML IJ SOLN
INTRAMUSCULAR | Status: DC | PRN
  Administered 2017-03-09: 100 ug via INTRAVENOUS

## 2017-03-09 MED ORDER — ONDANSETRON HCL 4 MG/2ML IJ SOLN: 4.0000 mg | Freq: Four times a day (QID) | INTRAMUSCULAR | Status: DC | PRN

## 2017-03-09 MED ORDER — SUCCINYLCHOLINE CHLORIDE 20 MG/ML IJ SOLN
INTRAMUSCULAR | Status: DC | PRN
  Administered 2017-03-09: 120 mg via INTRAVENOUS

## 2017-03-09 MED ORDER — POLYETHYLENE GLYCOL 3350 17 G PO PACK: 17.0000 g | PACK | Freq: Every day | ORAL | Status: DC | PRN

## 2017-03-09 MED ORDER — EPHEDRINE 5 MG/ML INJ
INTRAVENOUS | Status: AC
  Filled 2017-03-09: qty 10

## 2017-03-09 MED ORDER — METOCLOPRAMIDE HCL 5 MG PO TABS: 5.0000 mg | ORAL_TABLET | Freq: Three times a day (TID) | ORAL | Status: DC | PRN

## 2017-03-09 MED ORDER — 0.9 % SODIUM CHLORIDE (POUR BTL) OPTIME
TOPICAL | Status: DC | PRN
  Administered 2017-03-09: 1000 mL

## 2017-03-09 MED ORDER — MIDAZOLAM HCL 5 MG/5ML IJ SOLN
INTRAMUSCULAR | Status: DC | PRN
  Administered 2017-03-09: 1 mg via INTRAVENOUS

## 2017-03-09 MED ORDER — PHENYLEPHRINE 40 MCG/ML (10ML) SYRINGE FOR IV PUSH (FOR BLOOD PRESSURE SUPPORT)
PREFILLED_SYRINGE | INTRAVENOUS | Status: AC
  Filled 2017-03-09: qty 10

## 2017-03-09 MED ORDER — BISACODYL 10 MG RE SUPP: 10.0000 mg | Freq: Every day | RECTAL | Status: DC | PRN

## 2017-03-09 MED ORDER — OXYCODONE HCL 5 MG PO TABS
10.0000 mg | ORAL_TABLET | ORAL | Status: DC | PRN
  Administered 2017-03-09 – 2017-03-14 (×17): 10 mg via ORAL
  Filled 2017-03-09 (×17): qty 2

## 2017-03-09 MED ORDER — ONDANSETRON HCL 4 MG/2ML IJ SOLN
INTRAMUSCULAR | Status: DC | PRN
  Administered 2017-03-09: 4 mg via INTRAVENOUS

## 2017-03-09 MED ORDER — INSULIN ASPART 100 UNIT/ML ~~LOC~~ SOLN
3.0000 [IU] | Freq: Three times a day (TID) | SUBCUTANEOUS | Status: DC
  Administered 2017-03-09 – 2017-03-14 (×11): 3 [IU] via SUBCUTANEOUS

## 2017-03-09 SURGICAL SUPPLY — 33 items
BLADE SAW RECIP 87.9 MT (BLADE) ×5 IMPLANT
BNDG COHESIVE 6X5 TAN STRL LF (GAUZE/BANDAGES/DRESSINGS) ×3 IMPLANT
CANISTER WOUND CARE 500ML ATS (WOUND CARE) ×2 IMPLANT
COVER SURGICAL LIGHT HANDLE (MISCELLANEOUS) ×3 IMPLANT
CUFF TOURNIQUET SINGLE 34IN LL (TOURNIQUET CUFF) IMPLANT
DRAPE INCISE IOBAN 66X45 STRL (DRAPES) ×6 IMPLANT
DRAPE U-SHAPE 47X51 STRL (DRAPES) ×3 IMPLANT
DRESSING PREVENA PLUS CUSTOM (GAUZE/BANDAGES/DRESSINGS) ×1 IMPLANT
DRSG PREVENA PLUS CUSTOM (GAUZE/BANDAGES/DRESSINGS) ×3
DRSG VAC ATS LRG SENSATRAC (GAUZE/BANDAGES/DRESSINGS) ×2 IMPLANT
DURAPREP 26ML APPLICATOR (WOUND CARE) ×3 IMPLANT
ELECT REM PT RETURN 9FT ADLT (ELECTROSURGICAL) ×3
ELECTRODE REM PT RTRN 9FT ADLT (ELECTROSURGICAL) ×1 IMPLANT
GLOVE BIOGEL PI IND STRL 9 (GLOVE) ×1 IMPLANT
GLOVE BIOGEL PI INDICATOR 9 (GLOVE) ×2
GLOVE SURG ORTHO 9.0 STRL STRW (GLOVE) ×3 IMPLANT
GOWN STRL REUS W/ TWL XL LVL3 (GOWN DISPOSABLE) ×2 IMPLANT
GOWN STRL REUS W/TWL XL LVL3 (GOWN DISPOSABLE) ×6
KIT BASIN OR (CUSTOM PROCEDURE TRAY) ×3 IMPLANT
KIT ROOM TURNOVER OR (KITS) ×3 IMPLANT
MANIFOLD NEPTUNE II (INSTRUMENTS) ×1 IMPLANT
NS IRRIG 1000ML POUR BTL (IV SOLUTION) ×3 IMPLANT
PACK ORTHO EXTREMITY (CUSTOM PROCEDURE TRAY) ×3 IMPLANT
PAD ARMBOARD 7.5X6 YLW CONV (MISCELLANEOUS) ×3 IMPLANT
STAPLER VISISTAT 35W (STAPLE) IMPLANT
STOCKINETTE IMPERVIOUS LG (DRAPES) IMPLANT
SUT ETHILON 2 0 PSLX (SUTURE) ×6 IMPLANT
SUT SILK 2 0 (SUTURE)
SUT SILK 2-0 18XBRD TIE 12 (SUTURE) ×1 IMPLANT
TOWEL GREEN STERILE FF (TOWEL DISPOSABLE) ×3 IMPLANT
TUBE CONNECTING 20'X1/4 (TUBING) ×1
TUBE CONNECTING 20X1/4 (TUBING) ×2 IMPLANT
YANKAUER SUCT BULB TIP NO VENT (SUCTIONS) ×3 IMPLANT

## 2017-03-09 NOTE — Transfer of Care (Signed)
Immediate Anesthesia Transfer of Care Note  Patient: [name]  Procedure(s) Performed: LEFT ABOVE KNEE AMPUTATION (Left )  Patient Location: PACU  Anesthesia Type:General  Level of Consciousness: awake, alert , oriented and patient cooperative  Airway & Oxygen Therapy: Patient Spontanous Breathing and Patient connected to nasal cannula oxygen  Post-op Assessment: Report given to RN and Post -op Vital signs reviewed and stable  Post vital signs: Reviewed stable   Last Vitals:  Vitals:   03/09/17 0634 03/09/17 0638  BP:  (!) 96/50  Pulse: 83   Resp: 20   Temp: 37 C   SpO2: 99%     Last Pain:  Vitals:   03/09/17 0647  TempSrc:   PainSc: 5       Patients Stated Pain Goal: 3 (82/08/13 8871)  Complications: No apparent anesthesia complications

## 2017-03-09 NOTE — Evaluation (Signed)
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 PMH anxiety, OA, anemia, CKD, DM, displaced lumbar disc, hx DVT, ESRD, HTN, hypothyroidism, obesity, PAD, hx BKA, TKR   Clinical Impression   Patient received in bed, reporting high pain levels AKA site; spouse present and assisted in motivating patient to participate in PT evaluation this afternoon. Patient is very anxious and tearful, at times bordering on hysterical regarding his recent amputation and lack of mobility; note his spouse has been transferring him to his WC using a Hoyer lift since this past July following hip fractures but prior to this point he was independent with all mobility. Attempted bed mobility, patient unable to perform functional bed mobility even with Max assist from PT and becomes increasingly more anxious regarding mobility as session continued. Spouse reports that she cannot care well for patient at home at this point. He may benefit from skilled PT services primarily to address functional bed mobility, address amputee education, and to work on functional strengthening while hospitalized, and will likely benefit from SNF moving forward. Patient left in bed with all needs met, spouse present this afternoon.     Follow Up Recommendations SNF    Equipment Recommendations  None recommended by PT    Recommendations for Other Services       Precautions / Restrictions Precautions Precautions: Fall Precaution Comments: wound vac  Restrictions Weight Bearing Restrictions: No      Mobility  Bed Mobility Overal bed mobility: Needs Assistance Bed Mobility: Supine to Sit     Supine to sit: Total assist;HOB elevated     General bed mobility comments: attempted with max assist +1, recommend +2 for transfers OOB as  patient used Hoyer lift at home   Transfers                 General transfer comment: DNT, limited by pain/patient requires +2 for safe mobility   Ambulation/Gait             General Gait Details: unable at evaluation   Stairs            Wheelchair Mobility    Modified Rankin (Stroke Patients Only)       Balance Overall balance assessment: Needs assistance Sitting-balance support: Bilateral upper extremity supported;Feet supported Sitting balance-Leahy Scale: Fair       Standing balance-Leahy Scale: Poor Standing balance comment: DNT, assuming based on measures of assist needed for bed mobility                              Pertinent Vitals/Pain Pain Assessment: 0-10 Pain Score: 8  Pain Location: L AKA site  Pain Descriptors / Indicators: Sharp;Tender Pain Intervention(s): Limited activity within patient's tolerance;Monitored during session;Repositioned    Home Living Family/patient expects to be discharged to:: Skilled nursing facility Living Arrangements: Spouse/significant other               Additional Comments: has been living at home with his spouse, who he is dependent on for transfers OOB to [facility] with hoyer lift; he is generally able to mobilize in his [facility]     Prior Function Level of Independence: Needs assistance   Gait / Transfers Assistance Needed: needs Hoyer lift for transfer to [facility]  ADL's / Homemaking Assistance Needed:  wife assists         Hand Dominance   Dominant Hand: Right    Extremity/Trunk Assessment   Upper Extremity Assessment Upper Extremity Assessment: Overall WFL for tasks assessed    Lower Extremity Assessment Lower Extremity Assessment: Generalized weakness    Cervical / Trunk Assessment Cervical / Trunk Assessment: Kyphotic  Communication   Communication: No difficulties  Cognition Arousal/Alertness: Awake/alert Behavior During Therapy: WFL for tasks assessed/performed;Anxious Overall  Cognitive Status: Within Functional Limits for tasks assessed                                        General Comments      Exercises     Assessment/Plan    PT Assessment Patient needs continued PT services  PT Problem List Decreased strength;Decreased mobility;Decreased safety awareness;Decreased coordination;Decreased activity tolerance;Decreased balance;Decreased knowledge of use of DME;Pain       PT Treatment Interventions DME instruction;Therapeutic activities;Gait training;Therapeutic exercise;Patient/family education;Stair training;Balance training;Functional mobility training;Neuromuscular re-education    PT Goals (Current goals can be found in the Care Plan section)  Acute Rehab PT Goals Patient Stated Goal: to get well, go home  PT Goal Formulation: With patient/family Time For Goal Achievement: 03/23/17 Potential to Achieve Goals: Fair    Frequency Min 2X/week   Barriers to discharge        Co-evaluation               AM-PAC PT "6 Clicks" Daily Activity  Outcome Measure Difficulty turning over in bed (including adjusting bedclothes, sheets and blankets)?: Unable Difficulty moving from lying on back to sitting on the side of the bed? : Unable Difficulty sitting down on and standing up from a chair with arms (e.g., wheelchair, bedside commode, etc,.)?: Unable Help needed moving to and from a bed to chair (including a wheelchair)?: Total Help needed walking in hospital room?: Total Help needed climbing 3-5 steps with a railing? : Total 6 Click Score: 6    End of Session   Activity Tolerance: Patient limited by pain Patient left: in bed;with call bell/phone within reach;with family/visitor present   PT Visit Diagnosis: Unsteadiness on feet (R26.81);Muscle weakness (generalized) (M62.81);Other abnormalities of gait and mobility (R26.89);History of falling (Z91.81);Difficulty in walking, not elsewhere classified (R26.2)    Time:  3818-2993 PT Time Calculation (min) (ACUTE ONLY): 32 min   Charges:   PT Evaluation $PT Eval Moderate Complexity: 1 Mod PT Treatments $Self Care/Home Management: 8-22   PT G Codes:   PT G-Codes **NOT FOR INPATIENT CLASS** Functional Assessment Tool Used: AM-PAC 6 Clicks Basic Mobility;Clinical judgement    Deniece Ree PT, DPT, CBIS  Supplemental Physical Therapist [place]   Pager [phone]

## 2017-03-09 NOTE — Progress Notes (Addendum)
Patient crying and saying that his pain is 10/10 in left aka. Spoke with Dr. Sharol Given regarding his systolic BP being in the 25-63'S, asymptomatic. Dr. Sharol Given states okay to give patient IV Dilaudid and to keep an eye on BP.

## 2017-03-09 NOTE — Progress Notes (Signed)
Called Dr Linna Caprice MDA re: inability to titrate the neo below 61mcg/min. Pt's heart rate is never tachycardic, skin remains warm/dry and menatation is always appropriate. Dr Arloa Koh to d/c neo and take pt to hsi assigned room (med surg).

## 2017-03-09 NOTE — H&P (Signed)
[name] is an 62 y.o. male.   Chief Complaint: Abscess osteomyelitis left below-knee amputation. HPI: Patient is a 62 year old gentleman end-stage renal disease diabetes most recently status post internal fixation for distal femur fracture on the right who presents with abscess osteomyelitis ulceration left transtibial amputation.  Past Medical History:  Diagnosis Date  . Anemia   . Anxiety   . AR (allergic rhinitis)   . Arthritis    osteoarthritis  . Blood transfusion   . BPH (benign prostatic hypertrophy)   . Cholelithiasis   . Chronic kidney disease    hx of kidney stones  . Chronic pain   . Diabetes mellitus    insulin dependent  . Displacement of lumbar intervertebral disc without myelopathy    L4-L5  . Diverticulosis of colon   . DVT (deep venous thrombosis) (HCC)    Lower Extremity  . Embolism and thrombosis of unspecified artery ([place])   . ESRD (end stage renal disease) ([place])   . Gallstone   . History of nephrolithiasis    Bilateral  . Hyperlipidemia   . Hypertension   . Hypertrophy of prostate   . Hypothyroidism   . Impotence of organic origin   . Morbid obesity ([place])   . Nephrolithiasis   . Osteomyelitis ([place])    left tibia and left knee  . Other lymphedema   . Pancreatitis, acute   . Peripheral vascular disease ([place])   . Personal history of arthritis    Osteoarthritis  . Sleep apnea    uses cpap  . Unspecified septicemia(038.9) [facility])     Past Surgical History:  Procedure Laterality Date  . AMPUTATION  02/07/2011   Procedure: AMPUTATION BELOW KNEE;  Surgeon: Newt Minion, MD;  Location: [place];  Service: Orthopedics;  Laterality: Left;  Left Below Knee Amputation  . bilteral hip     replacement & revison  's   . CYSTOSCOPY W/ RETROGRADES Bilateral 10/24/2012   Procedure: CYSTOSCOPY WITH RETROGRADE PYELOGRAM Procedure: Cystoscopy, Bilateral Retrogarde Pyelograms, Bladder Biopsy, Hydrodistension;  Surgeon: Franchot Gallo, MD;  Location: [facility];   Service: Urology;  Laterality: Bilateral;  . KIDNEY SURGERY    . KNEE SURGERY     left knee    . LEG AMPUTATION BELOW KNEE    . REPLACEMENT TOTAL KNEE    . TONSILLECTOMY      Family History  Problem Relation Age of Onset  . Diabetes Mother   . Heart attack Mother   . Stroke Mother   . Colon cancer Father   . Dementia Father   . Heart attack Father   . Arthritis Paternal Grandmother   . Hypertension Other   . Hypothyroidism Other    Social History:  reports that  has never smoked. he has never used smokeless tobacco. He reports that he does not drink alcohol or use drugs.  Allergies: No Known Allergies  Medications Prior to Admission  Medication Sig Dispense Refill  . acetaminophen (TYLENOL) 500 MG tablet Take 500 mg by mouth every 6 (six) hours as needed for moderate pain.    Marland Kitchen atorvastatin (LIPITOR) 40 MG tablet TAKE 1 TABLET (40 MG TOTAL) BY MOUTH DAILY. 90 tablet 1  . gabapentin (NEURONTIN) 100 MG capsule TAKE 1 CAPSULE (100 MG TOTAL) BY MOUTH 3 (THREE) TIMES DAILY. 90 capsule 1  . HYDROCODONE-ACETAMINOPHEN PO Take 1 tablet by mouth 2 (two) times daily as needed (pain).    . insulin aspart (NOVOLOG) 100 UNIT/ML injection Inject as directed 2 (  two) times daily. Sliding scale    . Insulin Glargine (BASAGLAR KWIKPEN) 100 UNIT/ML SOPN Inject 30 Units into the skin at bedtime.    Marland Kitchen levothyroxine (SYNTHROID, LEVOTHROID) 200 MCG tablet Take 1 tablet (200 mcg total) by mouth every morning. (Patient taking differently: Take 225 mcg by mouth every morning. ) 90 tablet 0  . midodrine (PROAMATINE) 5 MG tablet Take 5 mg by mouth 3 (three) times daily with meals.     . sevelamer carbonate (RENVELA) 800 MG tablet Take 800-1,600 mg by mouth 3 (three) times daily with meals.     . Ascorbic Acid (VITAMIN C PO) Take 1 tablet by mouth 2 (two) times daily.    . clindamycin (CLEOCIN) 300 MG capsule Take 1 capsule (300 mg total) every 8 (eight) hours by mouth. (Patient not taking: Reported on  03/05/2017) 12 capsule 0  . collagenase (SANTYL) ointment Apply daily topically. (Patient not taking: Reported on 03/07/2017) 15 g 0  . ferrous sulfate 325 (65 FE) MG tablet Take 1 tablet (325 mg total) by mouth 2 (two) times daily. 180 tablet 1  . insulin glargine (LANTUS) 100 UNIT/ML injection Inject 0.28 mLs (28 Units total) into the skin at bedtime. (Patient not taking: Reported on 03/08/2017) 10 mL 0  . Multiple Vitamins-Minerals (MULTIVITAMINS THER. W/MINERALS) TABS Take 1 tablet by mouth daily.     Marland Kitchen oxyCODONE-acetaminophen (PERCOCET/ROXICET) 5-325 MG tablet Take 1 tablet by mouth 2 (two) times daily as needed for severe pain. (Patient not taking: Reported on 03/07/2017) 20 tablet 0  . oxyCODONE-acetaminophen (PERCOCET/ROXICET) 5-325 MG tablet Take 1 tablet by mouth every 6 (six) hours as needed for severe pain. (Patient not taking: Reported on 03/07/2017) 30 tablet 0    Results for orders placed or performed during the hospital encounter of 03/09/17 (from the past 48 hour(s))  Glucose, capillary     Status: Abnormal   Collection Time: 03/09/17  6:32 AM  Result Value Ref Range   Glucose-Capillary 124 (H) 65 - 99 mg/dL   No results found.  Review of Systems  All other systems reviewed and are negative.   Blood pressure (!) 96/50, pulse 83, temperature 98.6 F (37 C), temperature source Oral, resp. rate 20, height 6' (1.829 m), weight 260 lb (117.9 kg), SpO2 99 %. Physical Exam   Patient is alert, oriented, no adenopathy, well-dressed, normal affect, normal respiratory effort. Examination patient has purulent drainage from the left transtibial amputation this is probed the probes all the way down to the distal tibia and to the lateral of the stemmed tibial component.  Patient has a flexion contracture of the left knee and essentially no range of motion of the left total knee arthroplasty.  Examination the right heel patient has a decubitus ulcer which is 10 mm in diameter and 1 mm deep has good  granulation tissue is currently wearing a shoe which is applying pressure to this ulcer.  Assessment/Plan 1. Other closed fracture of distal end of right femur with routine healing, subsequent encounter   2. Traumatic closed nondisplaced fracture of neck of femur, left, sequela   3. Subacute osteomyelitis of left tibia (HCC)     Plan: Due to patient's purulent drainage from the left transtibial amputation and the fact that this communicates to the stem of the tibial component would require a above-the-knee amputation to correct the infection.  There is no modification to the transtibial amputation available due to the fact that the stemmed tibial component extends down to the amputation  site.  Patient does have a total hip with a periprosthetic fracture around the left total hip arthroplasty and has a stemmed femoral component as well.  Will need to proceed with a above-the-knee amputation between the stems of the femoral and total knee components.     Newt Minion, MD 03/09/2017, 6:52 AM

## 2017-03-09 NOTE — Consult Note (Signed)
[facility] Renal Consultation Note  Indication for Consultation:  Management of ESRD/hemodialysis; anemia, hypertension/volume and secondary hyperparathyroidism  HPI: [name] is a 62 y.o. male with ESRD on HD TTS ( op HD with High pt Dialysis on [place]  TTS )  , HTN, DM, HLD, hx nephroliths, s/p urostomy, s/p L BKA, s/p R femur fracture repair 08/2016. Now admitted post op L AKA with wound vac  By Dr Sharol Given. Ho fall with  recently status post internal fixation for distal femur fracture on the right who presents with abscess osteomyelitis ulceration left transtibial amputation thus AKA  With Wound vac.  We are consulted for ESRD needs and Hd.   Called OP kidney center for Dialysis  prescription and no reported problems  or missed recent txs using [facility] .    Past Medical History:  Diagnosis Date  . Anemia   . Anxiety   . AR (allergic rhinitis)   . Arthritis    osteoarthritis  . Blood transfusion   . BPH (benign prostatic hypertrophy)   . Cholelithiasis   . Chronic kidney disease    hx of kidney stones  . Chronic pain   . Diabetes mellitus    insulin dependent  . Displacement of lumbar intervertebral disc without myelopathy    L4-L5  . Diverticulosis of colon   . DVT (deep venous thrombosis) (HCC)    Lower Extremity  . Embolism and thrombosis of unspecified artery ([place])   . ESRD (end stage renal disease) ([place])   . Gallstone   . History of nephrolithiasis    Bilateral  . Hyperlipidemia   . Hypertension   . Hypertrophy of prostate   . Hypothyroidism   . Impotence of organic origin   . Morbid obesity ([place])   . Nephrolithiasis   . Osteomyelitis ([place])    left tibia and left knee  . Other lymphedema   . Pancreatitis, acute   . Peripheral vascular disease ([place])   . Personal history of arthritis    Osteoarthritis  . Sleep apnea    uses cpap  . Unspecified septicemia(038.9) [facility])     Past Surgical History:  Procedure Laterality Date  .  AMPUTATION  02/07/2011   Procedure: AMPUTATION BELOW KNEE;  Surgeon: Newt Minion, MD;  Location: [place];  Service: Orthopedics;  Laterality: Left;  Left Below Knee Amputation  . bilteral hip     replacement & revison  's   . CYSTOSCOPY W/ RETROGRADES Bilateral 10/24/2012   Procedure: CYSTOSCOPY WITH RETROGRADE PYELOGRAM Procedure: Cystoscopy, Bilateral Retrogarde Pyelograms, Bladder Biopsy, Hydrodistension;  Surgeon: Franchot Gallo, MD;  [facility];  Service: Urology;  Laterality: Bilateral;  . KIDNEY SURGERY    . KNEE SURGERY     left knee    . LEG AMPUTATION BELOW KNEE    . REPLACEMENT TOTAL KNEE    . TONSILLECTOMY        Family History  Problem Relation Age of Onset  . Diabetes Mother   . Heart attack Mother   . Stroke Mother   . Colon cancer Father   . Dementia Father   . Heart attack Father   . Arthritis Paternal Grandmother   . Hypertension Other   . Hypothyroidism Other       reports that  has never smoked. he has never used smokeless tobacco. He reports that he does not drink alcohol or use drugs.  No Known Allergies  Prior to Admission medications   Medication Sig Start  Date End Date Taking? Authorizing Provider  acetaminophen (TYLENOL) 500 MG tablet Take 500 mg by mouth every 6 (six) hours as needed for moderate pain.   Yes [provider]  atorvastatin (LIPITOR) 40 MG tablet TAKE 1 TABLET (40 MG TOTAL) BY MOUTH DAILY. 09/13/15  Yes Brunetta Jeans, PA-C  gabapentin (NEURONTIN) 100 MG capsule TAKE 1 CAPSULE (100 MG TOTAL) BY MOUTH 3 (THREE) TIMES DAILY. 12/26/15  Yes Brunetta Jeans, PA-C  HYDROCODONE-ACETAMINOPHEN PO Take 1 tablet by mouth 2 (two) times daily as needed (pain).   Yes [provider]  insulin aspart (NOVOLOG) 100 UNIT/ML injection Inject as directed 2 (two) times daily. Sliding scale 09/27/16  Yes [provider]  Insulin Glargine (BASAGLAR KWIKPEN) 100 UNIT/ML SOPN Inject 30 Units into the skin at bedtime.   Yes  [provider]  levothyroxine (SYNTHROID, LEVOTHROID) 200 MCG tablet Take 1 tablet (200 mcg total) by mouth every morning. Patient taking differently: Take 225 mcg by mouth every morning.  01/10/16  Yes Brunetta Jeans, PA-C  midodrine (PROAMATINE) 5 MG tablet Take 5 mg by mouth 3 (three) times daily with meals.  06/02/16  Yes [provider]  sevelamer carbonate (RENVELA) 800 MG tablet Take 800-1,600 mg by mouth 3 (three) times daily with meals.    Yes [provider]  Ascorbic Acid (VITAMIN C PO) Take 1 tablet by mouth 2 (two) times daily.    [provider]  clindamycin (CLEOCIN) 300 MG capsule Take 1 capsule (300 mg total) every 8 (eight) hours by mouth. Patient not taking: Reported on 03/05/2017 01/05/17   Nita Sells, MD  collagenase (SANTYL) ointment Apply daily topically. Patient not taking: Reported on 03/07/2017 01/05/17   Nita Sells, MD  ferrous sulfate 325 (65 FE) MG tablet Take 1 tablet (325 mg total) by mouth 2 (two) times daily. 08/10/14   Brunetta Jeans, PA-C  insulin glargine (LANTUS) 100 UNIT/ML injection Inject 0.28 mLs (28 Units total) into the skin at bedtime. Patient not taking: Reported on 03/08/2017 02/24/17   Sherwood Gambler, MD  Multiple Vitamins-Minerals (MULTIVITAMINS THER. W/MINERALS) TABS Take 1 tablet by mouth daily.     [provider]  oxyCODONE-acetaminophen (PERCOCET/ROXICET) 5-325 MG tablet Take 1 tablet by mouth 2 (two) times daily as needed for severe pain. Patient not taking: Reported on 03/07/2017 02/01/17   Suzan Slick, NP  oxyCODONE-acetaminophen (PERCOCET/ROXICET) 5-325 MG tablet Take 1 tablet by mouth every 6 (six) hours as needed for severe pain. Patient not taking: Reported on 03/07/2017 02/21/17   Meredith Pel, MD     Continuous: . sodium chloride    . [START ON 03/10/2017]  ceFAZolin (ANCEF) IV    . methocarbamol (ROBAXIN)  IV      Results for orders placed or performed during the  hospital encounter of 03/09/17 (from the past 48 hour(s))  Glucose, capillary     Status: Abnormal   Collection Time: 03/09/17  6:32 AM  Result Value Ref Range   Glucose-Capillary 124 (H) 65 - 99 mg/dL  Hemoglobin A1c     Status: None   Collection Time: 03/09/17  6:50 AM  Result Value Ref Range   Hgb A1c MFr Bld 5.1 4.8 - 5.6 %    Comment: (NOTE) Pre diabetes:          5.7%-6.4% Diabetes:              >6.4% Glycemic control for   <7.0% adults with diabetes  Mean Plasma Glucose 99.67 mg/dL  I-STAT, chem 8     Status: Abnormal   Collection Time: 03/09/17  6:53 AM  Result Value Ref Range   Sodium 140 135 - 145 mmol/L   Potassium 4.3 3.5 - 5.1 mmol/L   Chloride 103 101 - 111 mmol/L   BUN 32 (H) 6 - 20 mg/dL   Creatinine, Ser 4.80 (H) 0.61 - 1.24 mg/dL   Glucose, Bld 133 (H) 65 - 99 mg/dL   Calcium, Ion 0.95 (L) 1.15 - 1.40 mmol/L   TCO2 24 22 - 32 mmol/L   Hemoglobin 10.2 (L) 13.0 - 17.0 g/dL   HCT 30.0 (L) 39.0 - 52.0 %  Glucose, capillary     Status: Abnormal   Collection Time: 03/09/17  8:36 AM  Result Value Ref Range   Glucose-Capillary 114 (H) 65 - 99 mg/dL     ROS: see hpi  Physical Exam: Vitals:   03/09/17 1400 03/09/17 1415  BP:    Pulse: 71 75  Resp: 15 20  Temp:    SpO2: 99% 97%     General: Alert Obese WM OX3 , NAD  Talkative  HEENT:[place] , eomi, MMM Neck: no jvd  Heart: RRR no mur, no gallop or rub Lungs: CTA anteriorly  Abdomen: bs pos .soft, non-tender, urostomy present Extremities: RLE  Chronic venous changes 2+ pedal edema and Heal with ulcer wound dressing not removed , L AKA  Wound vac in place/ Dialysis Access: RUE AVF +bruit  Neuro- alert, OX3 , moves all extrem  To request  Dialysis Orders: Center: High pt Dialysis on [place]  TTS   . EDW 259 pounds = 118kg  HD Bath 2k, 2.5 ca  Time 4hr 67min  Heparin NONE. Access  RUA AVF     NO VIT D  Mircera 200 q 2wks  Last given 03/08/17      Assessment/Plan 1. L AKA sec . Osteom. L Tibia= per Dr  Sharol Given  Has wound vac 2. ESRD -  HD TTS , no hep  3. Chron hypotension - on midodrine 5 mg tid at home , pedal edema "chronic per wife  ,attempt 2/3liters 4. Volume - mild RLE edema, at dry wt 5. Anemia  -ESA  Next 1/24 on po iron , fu trend  6. Metabolic bone disease -  No vit d / binder Renvela  7. DM type 2- per admit   Ernest Haber, PA-C [place] [phone] 03/09/2017, 2:20 PM   Pt seen, examined and agree w A/P as above.  Kelly Splinter MD [employer] pager [phone]   03/10/2017, 7:35 AM

## 2017-03-09 NOTE — Anesthesia Postprocedure Evaluation (Signed)
Anesthesia Post Note  Patient: [name]  Procedure(s) Performed: LEFT ABOVE KNEE AMPUTATION (Left )     Patient location during evaluation: PACU Anesthesia Type: General Level of consciousness: awake, awake and alert and oriented Pain management: pain level controlled Vital Signs Assessment: post-procedure vital signs reviewed and stable Respiratory status: nonlabored ventilation, respiratory function stable and spontaneous breathing Cardiovascular status: blood pressure returned to baseline and stable Postop Assessment: no apparent nausea or vomiting Anesthetic complications: no    Last Vitals:  Vitals:   03/09/17 1434 03/09/17 1638  BP: (!) 83/46 (!) 86/56  Pulse: 72 74  Resp: 20   Temp:    SpO2: 100% 96%    Last Pain:  Vitals:   03/09/17 1406  TempSrc:   PainSc: Asleep                 Deryck Hippler COKER

## 2017-03-09 NOTE — Op Note (Signed)
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  SURGEON:  Newt Minion, MD  PHYSICIAN ASSISTANT:None ANESTHESIA:   General  PREOPERATIVE INDICATIONS:  [name] is a  62 y.o. 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    The risks benefits and alternatives were discussed with the patient preoperatively including but not limited to the risks of infection, bleeding, nerve injury, cardiopulmonary complications, the need for revision surgery, among others, and the patient was willing to proceed.  OPERATIVE IMPLANTS: Praveena wound VAC.  OPERATIVE FINDINGS: No infection at the most proximal aspect of the femoral stem.  No infection at the amputation site.  OPERATIVE PROCEDURE: Patient was brought to the operating room and underwent a general anesthetic.  After adequate levels of anesthesia were obtained patient's left lower extremity was prepped using DuraPrep draped into a sterile field a timeout was called.  A fishmouth incision was made just proximal to the patella.  This was carried down to the femur care was taken to ensure this was outside the joint capsule.  A segment of femur was resected to allow for removal of the intramedullary stem of the femoral component.  The vascular bundles medially were clamped and tied with 2-0 silk.  Hemostasis was obtained the amputation was completed the leg was delivered off the field.  After hemostasis was obtained the deep and superficial fascial layers and skin was closed using 2-0 nylon.  A Praveena wound VAC was applied this had a good suction fit patient was extubated taken the PACU in stable condition.   DISCHARGE PLANNING:  Antibiotic  duration: 24 hours postoperatively  Weightbearing: Nonweightbearing on the left weightbearing as tolerated on the right for transfers  Pain medication: As needed.  Dressing care/ Wound VAC: Wound VAC to remain in place for 1 week.  Ambulatory devices: As needed.  Discharge to: Skilled nursing facility.  Follow-up: In the office 1 week post operative.

## 2017-03-09 NOTE — Anesthesia Procedure Notes (Signed)
Procedure Name: Intubation Date/Time: 03/09/2017 7:51 AM Performed by: Shirlyn Goltz, CRNA Pre-anesthesia Checklist: Patient identified, Emergency Drugs available, Suction available and Patient being monitored Patient Re-evaluated:Patient Re-evaluated prior to induction Oxygen Delivery Method: Circle system utilized Preoxygenation: Pre-oxygenation with 100% oxygen Induction Type: IV induction Ventilation: Mask ventilation without difficulty Laryngoscope Size: Mac, Glidescope and 4 Grade View: Grade I Tube type: Oral Tube size: 7.5 mm Number of attempts: 1 Airway Equipment and Method: Rigid stylet,  Video-laryngoscopy and LTA kit utilized Placement Confirmation: ETT inserted through vocal cords under direct vision,  breath sounds checked- equal and bilateral and positive ETCO2 Secured at: 21 cm Tube secured with: Tape Dental Injury: Teeth and Oropharynx as per pre-operative assessment

## 2017-03-09 NOTE — Anesthesia Preprocedure Evaluation (Signed)
Anesthesia Evaluation  Patient identified by MRN, date of birth, ID band Patient awake    Reviewed: Allergy & Precautions, NPO status , Patient's Chart, lab work & pertinent test results  Airway Mallampati: III  TM Distance: >3 FB Neck ROM: Full    Dental  (+) Teeth Intact, Poor Dentition, Dental Advisory Given   Pulmonary    breath sounds clear to auscultation       Cardiovascular hypertension,  Rhythm:Regular Rate:Normal     Neuro/Psych    GI/Hepatic   Endo/Other  diabetes  Renal/GU      Musculoskeletal   Abdominal   Peds  Hematology   Anesthesia Other Findings   Reproductive/Obstetrics                             Anesthesia Physical Anesthesia Plan  ASA: III  Anesthesia Plan: General   Post-op Pain Management:    Induction: Intravenous  PONV Risk Score and Plan: Ondansetron and Metaclopromide  Airway Management Planned: Oral ETT  Additional Equipment:   Intra-op Plan:   Post-operative Plan:   Informed Consent: I have reviewed the patients History and Physical, chart, labs and discussed the procedure including the risks, benefits and alternatives for the proposed anesthesia with the patient or authorized representative who has indicated his/her understanding and acceptance.   Dental advisory given  Plan Discussed with: CRNA and Anesthesiologist  Anesthesia Plan Comments:         Anesthesia Quick Evaluation

## 2017-03-09 NOTE — Progress Notes (Signed)
PHARMACY NOTE:  ANTIMICROBIAL RENAL DOSAGE ADJUSTMENT  Current antimicrobial regimen includes a mismatch between antimicrobial dosage and estimated renal function.  As per policy approved by the Pharmacy & Therapeutics and Medical Executive Committees, the antimicrobial dosage will be adjusted accordingly.  Current antimicrobial dosage:  Ancef 2gm IV Q6H x 3 doses  Indication: surgical prophylaxis  Renal Function:  Estimated Creatinine Clearance: 22 mL/min (A) (by C-G formula based on SCr of 4.8 mg/dL (H)). [x]      On intermittent HD, scheduled:  TTS, may go tomorrow  []      On CRRT    Antimicrobial dosage has been changed to:  1gm IV 24 hrs post pre-op dose  Additional comments:   Thank you for allowing pharmacy to be a part of this patient's care.  Manases Etchison D. Mina Marble, PharmD, BCPS Pager:  [phone] 03/09/2017, 3:07 PM

## 2017-03-10 ENCOUNTER — Encounter (HOSPITAL_COMMUNITY): Payer: Self-pay | Admitting: Orthopedic Surgery

## 2017-03-10 LAB — GLUCOSE, CAPILLARY
GLUCOSE-CAPILLARY: 153 mg/dL — AB (ref 65–99)
Glucose-Capillary: 121 mg/dL — ABNORMAL HIGH (ref 65–99)
Glucose-Capillary: 139 mg/dL — ABNORMAL HIGH (ref 65–99)
Glucose-Capillary: 121 mg/dL — ABNORMAL HIGH (ref 65–99)

## 2017-03-10 LAB — RENAL FUNCTION PANEL
ANION GAP: 14 (ref 5–15)
Albumin: 2.7 g/dL — ABNORMAL LOW (ref 3.5–5.0)
BUN: 49 mg/dL — ABNORMAL HIGH (ref 6–20)
CO2: 20 mmol/L — ABNORMAL LOW (ref 22–32)
Calcium: 7.1 mg/dL — ABNORMAL LOW (ref 8.9–10.3)
Chloride: 98 mmol/L — ABNORMAL LOW (ref 101–111)
Creatinine, Ser: 5.94 mg/dL — ABNORMAL HIGH (ref 0.61–1.24)
GFR calc Af Amer: 11 mL/min — ABNORMAL LOW (ref >60)
GFR, EST NON AFRICAN AMERICAN: 9 mL/min — AB (ref >60)
Glucose, Bld: 181 mg/dL — ABNORMAL HIGH (ref 65–99)
PHOSPHORUS: 3.6 mg/dL (ref 2.5–4.6)
POTASSIUM: 5.3 mmol/L — AB (ref 3.5–5.1)
Sodium: 132 mmol/L — ABNORMAL LOW (ref 135–145)

## 2017-03-10 LAB — PREPARE RBC (CROSSMATCH)

## 2017-03-10 LAB — CBC
HEMATOCRIT: 22.3 % — AB (ref 39.0–52.0)
HEMOGLOBIN: 6.6 g/dL — AB (ref 13.0–17.0)
MCH: 28.8 pg (ref 26.0–34.0)
MCHC: 29.6 g/dL — ABNORMAL LOW (ref 30.0–36.0)
MCV: 97.4 fL (ref 78.0–100.0)
Platelets: 127 10*3/uL — ABNORMAL LOW (ref 150–400)
RBC: 2.29 MIL/uL — ABNORMAL LOW (ref 4.22–5.81)
RDW: 17.1 % — ABNORMAL HIGH (ref 11.5–15.5)
WBC: 5.4 10*3/uL (ref 4.0–10.5)

## 2017-03-10 LAB — HEPATITIS B SURFACE ANTIGEN: HEP B S AG: NEGATIVE

## 2017-03-10 MED ORDER — OXYCODONE HCL 5 MG PO TABS
ORAL_TABLET | ORAL | Status: AC
  Filled 2017-03-10: qty 2

## 2017-03-10 MED ORDER — SODIUM CHLORIDE 0.9 % IV SOLN: Freq: Once | INTRAVENOUS | Status: DC

## 2017-03-10 NOTE — Plan of Care (Signed)
  Activity: Risk for activity intolerance will decrease 03/10/2017 1326 - Progressing by Williams Che, RN   Nutrition: Adequate nutrition will be maintained 03/10/2017 1326 - Progressing by Williams Che, RN   Coping: Level of anxiety will decrease 03/10/2017 1326 - Progressing by Williams Che, RN   Pain Managment: General experience of comfort will improve 03/10/2017 1326 - Progressing by Williams Che, RN   Safety: Ability to remain free from injury will improve 03/10/2017 1326 - Progressing by Williams Che, RN

## 2017-03-10 NOTE — Progress Notes (Signed)
[facility] Progress Note  Subjective: no new c/o, Hb down 6.6 today, no cp or sob  Vitals:   03/10/17 0900 03/10/17 0915 03/10/17 0930 03/10/17 1000  BP: (!) 102/49 (!) 89/52 (!) 106/57 (!) 87/53  Pulse: 94 94 91 75  Resp: 18     Temp:      TempSrc:      SpO2:      Weight:      Height:        Inpatient medications: . aspirin EC  325 mg Oral Daily  . atorvastatin  40 mg Oral Daily  . docusate sodium  100 mg Oral BID  . gabapentin  100 mg Oral TID  . insulin aspart  0-9 Units Subcutaneous TID WC  . insulin aspart  3 Units Subcutaneous TID WC  . insulin glargine  20 Units Subcutaneous QHS  . levothyroxine  225 mcg Oral QAC breakfast  . midodrine  5 mg Oral TID WC  . sevelamer carbonate  1,600 mg Oral TID WC   . sodium chloride    . sodium chloride    .  ceFAZolin (ANCEF) IV    . methocarbamol (ROBAXIN)  IV     acetaminophen **OR** acetaminophen, bisacodyl, HYDROmorphone (DILAUDID) injection, magnesium citrate, methocarbamol **OR** methocarbamol (ROBAXIN)  IV, metoCLOPramide **OR** metoCLOPramide (REGLAN) injection, ondansetron **OR** ondansetron (ZOFRAN) IV, oxyCODONE, oxyCODONE, polyethylene glycol, sevelamer carbonate  Exam: General: Alert Obese WM OX3 , NAD  Talkative  HEENT: [place] , eomi, MMM Neck: no jvd  Heart: RRR no mur, no gallop or rub Lungs: CTA anteriorly  Abdomen: bs pos .soft, non-tender, urostomy present Extremities: RLE  Chronic venous changes, minimal pedal edema and Heal with ulcer wound dressing not removed , L AKA  Wound vac in place/ Dialysis Access: RUE AVF +bruit  Neuro- alert, OX3 , moves all extrem  To request  Dialysis: High pt Dialysis on [place]  TTS   . 4h 72min   118kg (259lb)    2/2.5 bath  Hep none  RUA AVF     NO VIT D  Mircera 200 q 2wks  Last given 03/08/17      Assessment: 1. L AKA sec . Osteom. L Tibia= per Dr Sharol Given  Has wound vac 2. ESRD -  HD TTS , no hep  3. Chron hypotension - on midodrine 5 mg tid at  home 4. Volume - mild RLE edema, 1kg up + tissue loss from BK revision 5. Anemia of ABL/ CKD - Hb down 6.6, give 2u prbc on HD, ESA  Next 1/24 on po iron 6.  7. Metabolic bone disease -  No vit d / binder Renvela  8. DM type 2- per admit    Plan - UF 1-2 L as tol, prbcs   Kelly Splinter MD [facility] [place] [facility] pager [phone]   03/10/2017, 10:14 AM   Recent Labs  Lab 03/09/17 0653 03/10/17 0855  NA 140 132*  K 4.3 5.3*  CL 103 98*  CO2  --  20*  GLUCOSE 133* 181*  BUN 32* 49*  CREATININE 4.80* 5.94*  CALCIUM  --  7.1*  PHOS  --  3.6   Recent Labs  Lab 03/10/17 0855  ALBUMIN 2.7*   Recent Labs  Lab 03/09/17 0653 03/10/17 0854  WBC  --  5.4  HGB 10.2* 6.6*  HCT 30.0* 22.3*  MCV  --  97.4  PLT  --  127*   Iron/TIBC/Ferritin/ %Sat    Component Value Date/Time  IRON 34 (L) 12/31/2016 0326   TIBC 178 (L) 12/31/2016 0326   FERRITIN 518 (H) 12/31/2016 0326   IRONPCTSAT 19 12/31/2016 0326

## 2017-03-10 NOTE — Progress Notes (Signed)
CRITICAL VALUE ALERT  Critical Value:  Hemoglobin 6.6  Date & Time Notied:  03/10/2017 0930  Provider Notified: Dr. Durward Fortes (MD on-call)  Orders Received/Actions taken: MD notified  Pt currently on hemodialysis, called the unit and was informed that Pt will be receiving 2u of blood.

## 2017-03-11 LAB — GLUCOSE, CAPILLARY
GLUCOSE-CAPILLARY: 135 mg/dL — AB (ref 65–99)
GLUCOSE-CAPILLARY: 158 mg/dL — AB (ref 65–99)
GLUCOSE-CAPILLARY: 376 mg/dL — AB (ref 65–99)
GLUCOSE-CAPILLARY: 173 mg/dL — AB (ref 65–99)
Glucose-Capillary: 149 mg/dL — ABNORMAL HIGH (ref 65–99)

## 2017-03-11 LAB — TYPE AND SCREEN
ABO/RH(D): O POS
Antibody Screen: NEGATIVE
Unit division: 0
Unit division: 0

## 2017-03-11 LAB — HEMOGLOBIN AND HEMATOCRIT, BLOOD
HEMATOCRIT: 26.7 % — AB (ref 39.0–52.0)
HEMOGLOBIN: 8.2 g/dL — AB (ref 13.0–17.0)

## 2017-03-11 LAB — BPAM RBC
BLOOD PRODUCT EXPIRATION DATE: 201902032359
BLOOD PRODUCT EXPIRATION DATE: 201902042359
ISSUE DATE / TIME: 201901121023
ISSUE DATE / TIME: 201901121023
UNIT TYPE AND RH: 5100
Unit Type and Rh: 5100

## 2017-03-11 MED ORDER — MIDODRINE HCL 5 MG PO TABS
10.0000 mg | ORAL_TABLET | Freq: Three times a day (TID) | ORAL | Status: DC
  Administered 2017-03-11 – 2017-03-14 (×10): 10 mg via ORAL
  Filled 2017-03-11 (×8): qty 2

## 2017-03-11 NOTE — Plan of Care (Signed)
  Nutrition: Adequate nutrition will be maintained 03/11/2017 0951 - Progressing by Williams Che, RN   Coping: Level of anxiety will decrease 03/11/2017 0951 - Progressing by Williams Che, RN   Elimination: Will not experience complications related to bowel motility 03/11/2017 0951 - Progressing by Williams Che, RN   Pain Managment: General experience of comfort will improve 03/11/2017 0951 - Progressing by Williams Che, RN   Safety: Ability to remain free from injury will improve 03/11/2017 0951 - Progressing by Williams Che, RN

## 2017-03-11 NOTE — Progress Notes (Signed)
Subjective:  Tolerated HD yesterday with Prbcs given  Objective Vital signs in last 24 hours: Vitals:   03/10/17 1325 03/10/17 2058 03/10/17 2100 03/11/17 0548  BP: (!) 107/55 (!) 81/45 (!) 92/44 (!) 88/48  Pulse: 95 85  79  Resp: 18     Temp: 98.4 F (36.9 C) 99.6 F (37.6 C)  98.9 F (37.2 C)  TempSrc: Oral Oral  Oral  SpO2: 98% 94%  94%  Weight:      Height:       Weight change:   Physical Exam: General:Alert Obese WM OX3 , NAD Talkative  Heart: RRRno mur, no gallop or rub Lungs: CTA anteriorly  Abdomen:bs pos .soft, non-tender, urostomy present Extremities: RLEChronic venous changes, minimal pedal edema and Heal with ulcer wound dressing not removed, LAKA Wound vac in place/ Dialysis Access: RUE AVF +bruit    Op Dialysis:High pt Dialysis on [place] TTS . 4h 39min   118kg (259lb)    2/2.5 bath  Hep none  RUA AVF  NO VIT D  Mircera 200 q 2wks Last given 03/08/17   :Problem/Plan: 1. L AKA sec . Osteom. L Tibia=  Rx per Dr Sharol Given Has wound vac 2. ESRD -HD TTS , no hep 3. Chron hypotension-on midodrine5 mg tid at home/ increase to 10 mg  4. Volume - mild RLE edema, yest  1kg up + tissue loss from BK revision 5. Anemia of ABL/ CKD- Hb down 6.6, give prbc on HD,check hgb today / ESA Next 1/24 on po iron 6. Metabolic bone disease -No vit d / binder Renvela phos 3.6 / corec Ca 8.2 7. DM type 2- per admit  Ernest Haber, PA-C [place] [phone] 03/11/2017,10:52 AM  LOS: 2 days   Pt seen, examined and agree w A/P as above.  Kelly Splinter MD [employer] pager [phone]   03/11/2017, 3:24 PM    Labs: Basic Metabolic Panel: Recent Labs  Lab 03/09/17 0653 03/10/17 0855  NA 140 132*  K 4.3 5.3*  CL 103 98*  CO2  --  20*  GLUCOSE 133* 181*  BUN 32* 49*  CREATININE 4.80* 5.94*  CALCIUM  --  7.1*  PHOS  --  3.6   Liver Function Tests: Recent Labs  Lab 03/10/17 0855  ALBUMIN 2.7*    No results for input(s): LIPASE, AMYLASE in the last 168 hours. No results for input(s): AMMONIA in the last 168 hours. CBC: Recent Labs  Lab 03/09/17 0653 03/10/17 0854  WBC  --  5.4  HGB 10.2* 6.6*  HCT 30.0* 22.3*  MCV  --  97.4  PLT  --  127*   Cardiac Enzymes: No results for input(s): CKTOTAL, CKMB, CKMBINDEX, TROPONINI in the last 168 hours. CBG: Recent Labs  Lab 03/10/17 0643 03/10/17 1334 03/10/17 1644 03/10/17 2101 03/11/17 0634  GLUCAP 121* 139* 121* 153* 135*    Studies/Results: No results found. Medications: . sodium chloride    . sodium chloride    . methocarbamol (ROBAXIN)  IV     . aspirin EC  325 mg Oral Daily  . atorvastatin  40 mg Oral Daily  . docusate sodium  100 mg Oral BID  . gabapentin  100 mg Oral TID  . insulin aspart  0-9 Units Subcutaneous TID WC  . insulin aspart  3 Units Subcutaneous TID WC  . insulin glargine  20 Units Subcutaneous QHS  . levothyroxine  225 mcg Oral QAC breakfast  . midodrine  5 mg Oral  TID WC  . sevelamer carbonate  1,600 mg Oral TID WC

## 2017-03-11 NOTE — Progress Notes (Signed)
Subjective: 2 Days Post-Op Procedure(s) (LRB): LEFT ABOVE KNEE AMPUTATION (Left) Patient reports pain as moderate.    Objective: Vital signs in last 24 hours: Temp:  [97.6 F (36.4 C)-99.6 F (37.6 C)] 98.9 F (37.2 C) (01/13 0548) Pulse Rate:  [73-95] 79 (01/13 0548) Resp:  [17-19] 18 (01/12 1325) BP: (80-107)/(44-68) 88/48 (01/13 0548) SpO2:  [93 %-98 %] 94 % (01/13 0548) Weight:  [259 lb 14.8 oz (117.9 kg)] 259 lb 14.8 oz (117.9 kg) (01/12 1253)  Intake/Output from previous day: 01/12 0701 - 01/13 0700 In: 530 [Blood:530] Out: 1035 [Drains:20] Intake/Output this shift: No intake/output data recorded.  Recent Labs    03/09/17 0653 03/10/17 0854  HGB 10.2* 6.6*   Recent Labs    03/09/17 0653 03/10/17 0854  WBC  --  5.4  RBC  --  2.29*  HCT 30.0* 22.3*  PLT  --  127*   Recent Labs    03/09/17 0653 03/10/17 0855  NA 140 132*  K 4.3 5.3*  CL 103 98*  CO2  --  20*  BUN 32* 49*  CREATININE 4.80* 5.94*  GLUCOSE 133* 181*  CALCIUM  --  7.1*   No results for input(s): LABPT, INR in the last 72 hours.  VAC in place  Assessment/Plan: 2 Days Post-Op Procedure(s) (LRB): LEFT ABOVE KNEE AMPUTATION (Left) Discharge to SNF when bed available  [name] 03/11/2017, 10:59 AM

## 2017-03-12 DIAGNOSIS — L8990 Pressure ulcer of unspecified site, unspecified stage: Secondary | ICD-10-CM

## 2017-03-12 LAB — RENAL FUNCTION PANEL
ALBUMIN: 2.5 g/dL — AB (ref 3.5–5.0)
Anion gap: 16 — ABNORMAL HIGH (ref 5–15)
BUN: 54 mg/dL — ABNORMAL HIGH (ref 6–20)
CALCIUM: 7.2 mg/dL — AB (ref 8.9–10.3)
CO2: 21 mmol/L — ABNORMAL LOW (ref 22–32)
Chloride: 95 mmol/L — ABNORMAL LOW (ref 101–111)
Creatinine, Ser: 6.17 mg/dL — ABNORMAL HIGH (ref 0.61–1.24)
GFR calc Af Amer: 10 mL/min — ABNORMAL LOW (ref >60)
GFR calc non Af Amer: 9 mL/min — ABNORMAL LOW (ref >60)
GLUCOSE: 121 mg/dL — AB (ref 65–99)
PHOSPHORUS: 4 mg/dL (ref 2.5–4.6)
POTASSIUM: 4.9 mmol/L (ref 3.5–5.1)
SODIUM: 132 mmol/L — AB (ref 135–145)

## 2017-03-12 LAB — GLUCOSE, CAPILLARY
GLUCOSE-CAPILLARY: 126 mg/dL — AB (ref 65–99)
Glucose-Capillary: 117 mg/dL — ABNORMAL HIGH (ref 65–99)
Glucose-Capillary: 155 mg/dL — ABNORMAL HIGH (ref 65–99)
Glucose-Capillary: 145 mg/dL — ABNORMAL HIGH (ref 65–99)

## 2017-03-12 LAB — CBC
HEMATOCRIT: 28.4 % — AB (ref 39.0–52.0)
Hemoglobin: 8.8 g/dL — ABNORMAL LOW (ref 13.0–17.0)
MCH: 29.2 pg (ref 26.0–34.0)
MCHC: 31 g/dL (ref 30.0–36.0)
MCV: 94.4 fL (ref 78.0–100.0)
Platelets: 131 10*3/uL — ABNORMAL LOW (ref 150–400)
RBC: 3.01 MIL/uL — ABNORMAL LOW (ref 4.22–5.81)
RDW: 17.4 % — ABNORMAL HIGH (ref 11.5–15.5)
WBC: 5.8 10*3/uL (ref 4.0–10.5)

## 2017-03-12 NOTE — Progress Notes (Signed)
[name] Progress Note   Subjective:   Patient states he is feeling ok. Tolerating dialysis well.   Objective Vitals:   03/11/17 0548 03/11/17 1306 03/11/17 2233 03/12/17 0552  BP: (!) 88/48 (!) 86/46 (!) 96/55 (!) 97/53  Pulse: 79 72 72 72  Resp:  18 18 18   Temp: 98.9 F (37.2 C) 98.7 F (37.1 C) 99.5 F (37.5 C) 98.4 F (36.9 C)  TempSrc: Oral Oral Oral Oral  SpO2: 94% 97% 97% 98%  Weight:      Height:       Physical Exam General:NAD, obese male, resting in bed, AAOx3 Heart: RRR, +7/8 systolic murmur, no rub or gallop Lungs:CTAB, no increased WOB Abdomen: obese, soft, NTND, +BS, no edema Extremities:L AKA w/wound vac in place, no stump edema. RLE - no edema, chronic venous changes  Dialysis Access: RUE AVF - aneurysmal, +b/t  Filed Weights   03/09/17 0634 03/10/17 0845 03/10/17 1253  Weight: 117.9 kg (260 lb) 119.1 kg (262 lb 9.1 oz) 117.9 kg (259 lb 14.8 oz)    Intake/Output Summary (Last 24 hours) at 03/12/2017 1337 Last data filed at 03/12/2017 1100 Gross per 24 hour  Intake 720 ml  Output -  Net 720 ml    Additional Objective Labs: Basic Metabolic Panel: Recent Labs  Lab 03/09/17 0653 03/10/17 0855 03/12/17 0723  NA 140 132* 132*  K 4.3 5.3* 4.9  CL 103 98* 95*  CO2  --  20* 21*  GLUCOSE 133* 181* 121*  BUN 32* 49* 54*  CREATININE 4.80* 5.94* 6.17*  CALCIUM  --  7.1* 7.2*  PHOS  --  3.6 4.0   Liver Function Tests: Recent Labs  Lab 03/10/17 0855 03/12/17 0723  ALBUMIN 2.7* 2.5*   CBC: Recent Labs  Lab 03/10/17 0854 03/11/17 1204 03/12/17 0723  WBC 5.4  --  5.8  HGB 6.6* 8.2* 8.8*  HCT 22.3* 26.7* 28.4*  MCV 97.4  --  94.4  PLT 127*  --  131*   CBG: Recent Labs  Lab 03/11/17 1631 03/11/17 1634 03/11/17 2232 03/12/17 0636 03/12/17 1149  GLUCAP 376* 149* 173* 117* 155*    Lab Results  Component Value Date   INR 1.78 (H) 03/07/2013   INR 2.59 (H) 01/03/2013   INR 1.84 (H) 12/09/2012    Studies/Results: No results found.  Medications: . sodium chloride    . sodium chloride    . methocarbamol (ROBAXIN)  IV     . aspirin EC  325 mg Oral Daily  . atorvastatin  40 mg Oral Daily  . docusate sodium  100 mg Oral BID  . gabapentin  100 mg Oral TID  . insulin aspart  0-9 Units Subcutaneous TID WC  . insulin aspart  3 Units Subcutaneous TID WC  . insulin glargine  20 Units Subcutaneous QHS  . levothyroxine  225 mcg Oral QAC breakfast  . midodrine  10 mg Oral TID WC  . sevelamer carbonate  1,600 mg Oral TID WC    Dialysis Orders: High pt Dialysis on [place] TTS  4h 51min 118kg (259lb) 2/2.5 bath Hep none RUA AVF NO VIT D  Mircera 200 q 2wks Last given 03/08/17    Assessment/Plan: 1. Osteomyelitis L tibia & L total knee arthroplasty - s/p L AKA Dr. Sharol Given on 03/09/17. Per ortho 2. ESRD - IP HD per normal schedule TTS, while hospitalized. No Heparin 3. Anemia of CKD- Hgb trending up 8.2>8.8. S/p 2 Units PRBC 1/12 4. Secondary hyperparathyroidism -  Cor Ca 8.4, P 4.0. Continue binders. Not on VDRA 5. Hypotension/Volume- Chronic on midodrine.  Does not appear volume overloaded on exam, no recent weights.  Titrate down volume as tolerated. 6. Nutrition - Alb 2.5,  Renal diet, Renavite, Nepro TID 7. DMT2 - per primary  Jen Mow, PA-C [name] Pager: [phone] 03/12/2017,1:37 PM  LOS: 3 days

## 2017-03-12 NOTE — Progress Notes (Signed)
Patient ID: [name], male   DOB: [birth year], 62 y.o.   MRN: [id number] Postoperative day 3 above-knee amputation on the left.  Patient has minimal drainage in the wound VAC.  Patient states he is also having some sciatic pain down the left lower extremity.  Plan for discharge to skilled nursing facility.

## 2017-03-12 NOTE — NC FL2 (Signed)
[place] MEDICAID FL2 LEVEL OF CARE SCREENING TOOL     IDENTIFICATION  Patient Name: [name] Birthdate: November 17, 1955 Sex: male Admission Date (Current Location): 03/09/2017  [facility] and [place] Number:  Herbalist and Address:  [facility] [place], [place] [address] [place],  [address]      Provider Number: [id number]  Attending Physician Name and Address:  Newt Minion, MD  Relative Name and Phone Number:  Dominic Mahaney    Current Level of Care: Hospital Recommended Level of Care: [place] Prior Approval Number:    Date Approved/Denied:   PASRR Number: [id number]  Discharge Plan: SNF    Current Diagnoses: Patient Active Problem List   Diagnosis Date Noted  . Pressure injury of skin 03/12/2017  . S/P AKA (above knee amputation) unilateral, left ([place]) 03/09/2017  . Pyogenic arthritis of left knee joint ([place])   . Subacute osteomyelitis of left tibia ([place]) 03/05/2017  . Traumatic closed nondisplaced fracture of neck of femur, left, sequela 03/05/2017  . Closed fracture of lower end of right femur with routine healing 03/05/2017  . DDD (degenerative disc disease), lumbosacral   . Morbid obesity ([place])   . Anemia of chronic disease   . Thrombocytopenia ([place])   . Idiopathic chronic venous hypertension of right lower extremity with inflammation   . Lower back pain 12/27/2016  . UTI (urinary tract infection) 12/27/2016  . Non-pressure chronic ulcer of left calf, limited to breakdown of skin ([place]) 02/07/2016  . Hypotension, unspecified 11/24/2015  . Left knee pain 11/24/2015  . Hypotension 11/24/2015  . Decubitus ulcer of buttock 01/22/2015  . S/P BKA (below knee amputation) unilateral ([place]) 01/22/2015  . Chronic venous insufficiency 09/08/2014  . Diabetes mellitus, type 2 ([place]) 09/08/2014  . ESRD on dialysis ([place]) 09/08/2014  . Hyperlipidemia 12/23/2013  . Diabetic foot ([place]) 11/20/2013  . Chronic pain 05/21/2013   . Chronic interstitial cystitis 02/06/2013  . UI (urinary incontinence) 10/29/2012  . CKD (chronic kidney disease) stage 3, GFR 30-59 ml/min (HCC) 08/18/2012  . Cellulitis of left leg 08/14/2012  . Hyperkalemia 08/04/2012  . Hypothyroidism, acquired 08/04/2012  . OBESITY 09/21/2009  . Essential hypertension 09/21/2009  . DIVERTICULOSIS OF COLON 09/21/2009  . Type 2 diabetes mellitus with diabetic nephropathy, with long-term current use of insulin ([place]) 10/01/2007  . DVT 10/01/2007    Orientation RESPIRATION BLADDER Height & Weight     Self, Time, Situation, Place  Normal Continent Weight: 259 lb 14.8 oz (117.9 kg) Height:  6' (182.9 cm)  BEHAVIORAL SYMPTOMS/MOOD NEUROLOGICAL BOWEL NUTRITION STATUS      Continent Diet(See DC Summary)  AMBULATORY STATUS COMMUNICATION OF NEEDS Skin   Extensive Assist Verbally Surgical wounds, Wound Vac, PU Stage and Appropriate Care PU Stage 1 Dressing: (PRN)                     Personal Care Assistance Level of Assistance  Dressing, Bathing, Feeding Bathing Assistance: Maximum assistance Feeding assistance: Independent Dressing Assistance: Maximum assistance     Functional Limitations Info  Sight, Hearing, Speech Sight Info: Adequate Hearing Info: Adequate Speech Info: Adequate    SPECIAL CARE FACTORS FREQUENCY  PT (By licensed PT), OT (By licensed OT)     PT Frequency: 5x week OT Frequency: 5x week            Contractures      Additional Factors Info  Code Status, Allergies, Insulin Sliding Scale, Isolation Precautions Code Status  Info: Full Allergies Info: No Known Allergies   Insulin Sliding Scale Info: Insulin Daily Isolation Precautions Info: MRSA     Current Medications (03/12/2017):  This is the current hospital active medication list Current Facility-Administered Medications  Medication Dose Route Frequency Provider Last Rate Last Dose  . 0.9 %  sodium chloride infusion   Intravenous Continuous Newt Minion,  MD      . 0.9 %  sodium chloride infusion   Intravenous Once Roney Jaffe, MD      . acetaminophen (TYLENOL) tablet 650 mg  650 mg Oral Q4H PRN Newt Minion, MD       Or  . acetaminophen (TYLENOL) suppository 650 mg  650 mg Rectal Q4H PRN Newt Minion, MD      . aspirin EC tablet 325 mg  325 mg Oral Daily Newt Minion, MD   325 mg at 03/12/17 0811  . atorvastatin (LIPITOR) tablet 40 mg  40 mg Oral Daily Newt Minion, MD   40 mg at 03/12/17 3875  . bisacodyl (DULCOLAX) suppository 10 mg  10 mg Rectal Daily PRN Newt Minion, MD      . docusate sodium (COLACE) capsule 100 mg  100 mg Oral BID Newt Minion, MD   100 mg at 03/12/17 0811  . gabapentin (NEURONTIN) capsule 100 mg  100 mg Oral TID Newt Minion, MD   100 mg at 03/12/17 0811  . HYDROmorphone (DILAUDID) injection 1 mg  1 mg Intravenous Q2H PRN Newt Minion, MD   1 mg at 03/11/17 0417  . insulin aspart (novoLOG) injection 0-9 Units  0-9 Units Subcutaneous TID WC Newt Minion, MD   2 Units at 03/12/17 1247  . insulin aspart (novoLOG) injection 3 Units  3 Units Subcutaneous TID WC Newt Minion, MD   3 Units at 03/12/17 1247  . insulin glargine (LANTUS) injection 20 Units  20 Units Subcutaneous QHS Newt Minion, MD   20 Units at 03/11/17 2127  . levothyroxine (SYNTHROID, LEVOTHROID) tablet 225 mcg  225 mcg Oral QAC breakfast Newt Minion, MD   225 mcg at 03/12/17 [phone]  . magnesium citrate solution 1 Bottle  1 Bottle Oral Once PRN Newt Minion, MD      . methocarbamol (ROBAXIN) tablet 500 mg  500 mg Oral Q6H PRN Newt Minion, MD   500 mg at 03/11/17 1657   Or  . methocarbamol (ROBAXIN) 500 mg in dextrose 5 % 50 mL IVPB  500 mg Intravenous Q6H PRN Newt Minion, MD      . metoCLOPramide (REGLAN) tablet 5 mg  5 mg Oral Q8H PRN Newt Minion, MD       Or  . metoCLOPramide (REGLAN) injection 5 mg  5 mg Intravenous Q8H PRN Newt Minion, MD      . midodrine (PROAMATINE) tablet 10 mg  10 mg Oral TID WC Ernest Haber,  PA-C   10 mg at 03/12/17 1246  . ondansetron (ZOFRAN) tablet 4 mg  4 mg Oral Q6H PRN Newt Minion, MD       Or  . ondansetron [facility]) injection 4 mg  4 mg Intravenous Q6H PRN Newt Minion, MD      . oxyCODONE (Oxy IR/ROXICODONE) immediate release tablet 10 mg  10 mg Oral Q3H PRN Newt Minion, MD   10 mg at 03/12/17 1246  . oxyCODONE (Oxy IR/ROXICODONE) immediate release tablet 5 mg  5 mg Oral  Q3H PRN Newt Minion, MD      . polyethylene glycol (MIRALAX / GLYCOLAX) packet 17 g  17 g Oral Daily PRN Newt Minion, MD      . sevelamer carbonate (RENVELA) tablet 1,600 mg  1,600 mg Oral TID WC Newt Minion, MD   1,600 mg at 03/12/17 1246  . sevelamer carbonate (RENVELA) tablet 800 mg  800 mg Oral PRN Newt Minion, MD         Discharge Medications: Please see discharge summary for a list of discharge medications.  Relevant Imaging Results:  Relevant Lab Results:   Additional Information SS#: [id number]  Hemodialysis T/Th/Saturday  Normajean Baxter, LCSW

## 2017-03-12 NOTE — Social Work (Signed)
CSW went to meet with patient, however, patient resting.   CSW will f/u.  Elissa Hefty, LCSW Clinical Social Worker [phone]

## 2017-03-13 LAB — CBC
HEMATOCRIT: 27.1 % — AB (ref 39.0–52.0)
HEMOGLOBIN: 8.4 g/dL — AB (ref 13.0–17.0)
MCH: 29.4 pg (ref 26.0–34.0)
MCHC: 31 g/dL (ref 30.0–36.0)
MCV: 94.8 fL (ref 78.0–100.0)
Platelets: 135 10*3/uL — ABNORMAL LOW (ref 150–400)
RBC: 2.86 MIL/uL — AB (ref 4.22–5.81)
RDW: 17.1 % — ABNORMAL HIGH (ref 11.5–15.5)
WBC: 7.1 10*3/uL (ref 4.0–10.5)

## 2017-03-13 LAB — GLUCOSE, CAPILLARY
GLUCOSE-CAPILLARY: 113 mg/dL — AB (ref 65–99)
Glucose-Capillary: 112 mg/dL — ABNORMAL HIGH (ref 65–99)
Glucose-Capillary: 160 mg/dL — ABNORMAL HIGH (ref 65–99)
Glucose-Capillary: 180 mg/dL — ABNORMAL HIGH (ref 65–99)

## 2017-03-13 LAB — RENAL FUNCTION PANEL
ANION GAP: 17 — AB (ref 5–15)
Albumin: 2.5 g/dL — ABNORMAL LOW (ref 3.5–5.0)
BUN: 70 mg/dL — ABNORMAL HIGH (ref 6–20)
CALCIUM: 7.3 mg/dL — AB (ref 8.9–10.3)
CHLORIDE: 95 mmol/L — AB (ref 101–111)
CO2: 20 mmol/L — ABNORMAL LOW (ref 22–32)
Creatinine, Ser: 8.13 mg/dL — ABNORMAL HIGH (ref 0.61–1.24)
GFR calc non Af Amer: 6 mL/min — ABNORMAL LOW (ref >60)
GFR, EST AFRICAN AMERICAN: 7 mL/min — AB (ref >60)
GLUCOSE: 106 mg/dL — AB (ref 65–99)
POTASSIUM: 5.2 mmol/L — AB (ref 3.5–5.1)
Phosphorus: 4.5 mg/dL (ref 2.5–4.6)
Sodium: 132 mmol/L — ABNORMAL LOW (ref 135–145)

## 2017-03-13 MED ORDER — ALTEPLASE 2 MG IJ SOLR: 2.0000 mg | Freq: Once | INTRAMUSCULAR | Status: DC | PRN

## 2017-03-13 MED ORDER — ACETAMINOPHEN 325 MG PO TABS
ORAL_TABLET | ORAL | Status: AC
  Administered 2017-03-13: 650 mg via ORAL
  Filled 2017-03-13: qty 2

## 2017-03-13 MED ORDER — LIDOCAINE HCL (PF) 1 % IJ SOLN: 5.0000 mL | INTRAMUSCULAR | Status: DC | PRN

## 2017-03-13 MED ORDER — SODIUM CHLORIDE 0.9 % IV SOLN: 100.0000 mL | INTRAVENOUS | Status: DC | PRN

## 2017-03-13 MED ORDER — PENTAFLUOROPROP-TETRAFLUOROETH EX AERO: 1.0000 "application " | INHALATION_SPRAY | CUTANEOUS | Status: DC | PRN

## 2017-03-13 MED ORDER — LIDOCAINE-PRILOCAINE 2.5-2.5 % EX CREA: 1.0000 "application " | TOPICAL_CREAM | CUTANEOUS | Status: DC | PRN

## 2017-03-13 MED ORDER — MIDODRINE HCL 5 MG PO TABS
ORAL_TABLET | ORAL | Status: AC
  Administered 2017-03-13: 10 mg via ORAL
  Filled 2017-03-13: qty 2

## 2017-03-13 MED ORDER — HEPARIN SODIUM (PORCINE) 1000 UNIT/ML DIALYSIS: 1000.0000 [IU] | INTRAMUSCULAR | Status: DC | PRN

## 2017-03-13 MED ORDER — DARBEPOETIN ALFA 150 MCG/0.3ML IJ SOSY: 150.0000 ug | PREFILLED_SYRINGE | INTRAMUSCULAR | Status: DC

## 2017-03-13 NOTE — Clinical Social Work Note (Signed)
Clinical Social Work Assessment  Patient Details  Name: MELBERT BOTELHO MRN: [id number] Date of Birth: December 21, 1955  Date of referral:  03/13/17               Reason for consult:  Facility Placement                Permission sought to share information with:  Chartered certified accountant granted to share information::  Yes, Verbal Permission Granted  Name::     Personnel officer::  SNF  Relationship::     Contact Information:     Housing/Transportation Living arrangements for the past 2 months:  Apartment Source of Information:  Patient Patient Interpreter Needed:  None Criminal Activity/Legal Involvement Pertinent to Current Situation/Hospitalization:  No - Comment as needed Significant Relationships:  Spouse Lives with:  Spouse Do you feel safe going back to the place where you live?  No Need for family participation in patient care:  Yes (Comment)  Care giving concerns:  Pt resides at home with spouse. Pt indicated that he was independent prior to impairment about July. Pt has new impairment and will need skilled nursing. Pt in agreement and has experience with SNF.  Pt had minimal questions as he familiar with SNF process and options. Pt indicated SNF's that he would like receive rehab prior to returning home with wife. Pt goies to dialysis T/Th/Saturday at [facility] and chair time is 10:30 am.  Facilities manager / plan:  CSW will f/u for disposition.   Employment status:  Disabled (Comment on whether or not currently receiving Disability) Insurance information:  Managed Medicare PT Recommendations:  [place] / Referral to community resources:  [place]  Patient/Family's Response to care:  Patient appreciative of CSW assistance with SNF placement and options and thanked CSW for coming to meet. CSW agreed to f/u with patient on SNF offers.  Patient/Family's Understanding of and Emotional Response  to Diagnosis, Current Treatment, and Prognosis:  Patient has understanding of his limitations and identified that life has changed back in July when he had the initial impairment. He desires to return to baseline and hopes to get good rehab so that he can improve significantly. Pt has been to a few SNF's and hopes that he gets good rehab. CSW validates patients concerns. CSW will continue to follow up. No issues and concerns.  Emotional Assessment Appearance:  Appears stated age Attitude/Demeanor/Rapport:  (Cooperative) Affect (typically observed):  Accepting, Appropriate Orientation:  Oriented to Self, Oriented to Place, Oriented to  Time, Oriented to Situation Alcohol / Substance use:  Not Applicable Psych involvement (Current and /or in the community):  No (Comment)  Discharge Needs  Concerns to be addressed:  Discharge Planning Concerns Readmission within the last 30 days:  No Current discharge risk:  Physical Impairment, Dependent with Mobility Barriers to Discharge:  No Barriers Identified   Normajean Baxter, LCSW 03/13/2017, 10:38 AM

## 2017-03-13 NOTE — Progress Notes (Signed)
[facility] Progress Note   Subjective:   No new complaints. Seen while cannulated in HD.  Objective Vitals:   03/12/17 0552 03/12/17 1300 03/12/17 2050 03/13/17 0539  BP: (!) 97/53 (!) 102/57 (!) 108/54 (!) 109/53  Pulse: 72 70 70 84  Resp: 18 18 18 17   Temp: 98.4 F (36.9 C) 98.1 F (36.7 C) 98.6 F (37 C) 98.4 F (36.9 C)  TempSrc: Oral Oral Oral Oral  SpO2: 98% 98% 97% 94%  Weight:      Height:       Physical Exam General: NAD, obese male, laying in bed. AAOx3 Heart: RRR, +1/0 systolic murmur, no rub or gallop Lungs:CTAB, nml WOB Abdomen: obese, soft, NTND, +BS no edema Extremities:L AKA w/wound vac inplace, no stump edema. RLE - no edema, +chronic venous changes Dialysis Access: RU AVF cannulated   Filed Weights   03/09/17 0634 03/10/17 0845 03/10/17 1253  Weight: 117.9 kg (260 lb) 119.1 kg (262 lb 9.1 oz) 117.9 kg (259 lb 14.8 oz)    Intake/Output Summary (Last 24 hours) at 03/13/2017 1224 Last data filed at 03/13/2017 1015 Gross per 24 hour  Intake 240 ml  Output -  Net 240 ml    Additional Objective Labs: Basic Metabolic Panel: Recent Labs  Lab 03/09/17 0653 03/10/17 0855 03/12/17 0723  NA 140 132* 132*  K 4.3 5.3* 4.9  CL 103 98* 95*  CO2  --  20* 21*  GLUCOSE 133* 181* 121*  BUN 32* 49* 54*  CREATININE 4.80* 5.94* 6.17*  CALCIUM  --  7.1* 7.2*  PHOS  --  3.6 4.0   Liver Function Tests: Recent Labs  Lab 03/10/17 0855 03/12/17 0723  ALBUMIN 2.7* 2.5*   CBC: Recent Labs  Lab 03/10/17 0854 03/11/17 1204 03/12/17 0723 03/13/17 1157  WBC 5.4  --  5.8 7.1  HGB 6.6* 8.2* 8.8* 8.4*  HCT 22.3* 26.7* 28.4* 27.1*  MCV 97.4  --  94.4 94.8  PLT 127*  --  131* 135*    Recent Labs  Lab 03/12/17 1149 03/12/17 1636 03/12/17 2222 03/13/17 0715 03/13/17 1207  GLUCAP 155* 145* 126* 112* 113*    Medications: . sodium chloride    . sodium chloride    . methocarbamol (ROBAXIN)  IV     . aspirin EC  325 mg Oral Daily  .  atorvastatin  40 mg Oral Daily  . docusate sodium  100 mg Oral BID  . gabapentin  100 mg Oral TID  . insulin aspart  0-9 Units Subcutaneous TID WC  . insulin aspart  3 Units Subcutaneous TID WC  . insulin glargine  20 Units Subcutaneous QHS  . levothyroxine  225 mcg Oral QAC breakfast  . midodrine  10 mg Oral TID WC  . sevelamer carbonate  1,600 mg Oral TID WC    Dialysis Orders: [facility] pt Dialysis on [place] T[facility]  4h 33min 118kg (259lb) 2/2.5 bath Hep none RUA AVF NO VIT D  Mircera 200 q 2wks Last given 03/08/17    Assessment/Plan: 1. Osteomyelitis L tibia & L total knee arthroplasty - s/p L AKA Dr. Sharol Given on 03/09/17. Wound vac in place. Per ortho 2. ESRD - HD today, continue IP HD per normal TTS schedule while admitted. No Heparin. 3. Anemia of CKD- Hgb up and down 8.4>8.8>8.2. S/p 2 Units PRBC 1/12, Aranesp 146mcg IV qwk to start Thurs. follow 4. Secondary hyperparathyroidism -  Cor Ca 8.5, P 4.5. Continue binders. Not on VDRA 5.  Hypotension/Volume- Chronically soft, on midodrine. If weights correct close to EDW, titrate down volume as tolerated.  Will need new EDW at discharge. 6. Nutrition - Alb 2.5,  Renal diet with fluid restriction, Renavite, Nepro TID 7. DMT2 - per primary    Jen Mow, PA-C [facility] Pager: [phone] 03/13/2017,12:24 PM  LOS: 4 days

## 2017-03-13 NOTE — Social Work (Signed)
CSW spoke with admission staff at [facility] to see if they can offer SNF bed. Pt has been to SNF in the past and would be interested in that SNF. CSW will continue to follow up for SNF placement.  Elissa Hefty, LCSW Clinical Social Worker [phone]

## 2017-03-13 NOTE — Social Work (Addendum)
Pt in dialysis now and CSW unable to discuss SNF offers.  CSW will f/u as patient will need Insurance Auth for SNF placement and SNF will need to obtain.  CSW called Admissions at [facility] and will continue to follow up as SNF reviewing clinicals.  4:20pm [facility] has offered a SNF bed, however patient in dialysis and CSW unable to discuss SNF offers. CSW called spouse and left message requesting a call back.  SNF will need to obtain Insurance Auth for placement.   CSW will f/u.  Elissa Hefty, LCSW Clinical Social Worker [phone]

## 2017-03-13 NOTE — Progress Notes (Signed)
Patient ID: [name], male   DOB: 03/01/1955, 63 y.o.   MRN: [id number] Patient resting this morning his wife is with him.  They have not talked to the social worker yet.  Plan for discharge to rehab.  Wound VAC functioning well.

## 2017-03-13 NOTE — Progress Notes (Signed)
Physical Therapy Treatment Patient Details Name: [name] MRN: [id number] DOB: 12-Feb-1956 Today's Date: 03/13/2017    History of Present Illness 62yo male with abscess osteomyelitis ulceration at L transtibial amputation site, s/p L AKA 03/09/17. PMH anxiety, OA, anemia, CKD, DM, displaced lumbar disc, hx DVT, ESRD, HTN, hypothyroidism, obesity, PAD, hx BKA, TKR     PT Comments    Patient received in bed, pleasant but anxious regarding his cell phone as he has not been able to find it. Reassured patient and then began functional exercises for intact LE and residual limb with patient able to follow cues well and perform all exercises without pain limitation. Then practiced functional rolling in bed with patient initially requiring Max assist/Mod VC, improving to Min-Mod assist/Min VC with practice; note cell phone was found in bed under patient. Patient fatigued at this point, and was left in bed with all needs met this morning.     Follow Up Recommendations  SNF     Equipment Recommendations  None recommended by PT    Recommendations for Other Services       Precautions / Restrictions Precautions Precautions: Fall Precaution Comments: wound vac  Restrictions Weight Bearing Restrictions: No    Mobility  Bed Mobility Overal bed mobility: Needs Assistance Bed Mobility: Rolling Rolling: Mod assist         General bed mobility comments: VC for hand placement and efficiency of roll  Transfers                 General transfer comment: DNT, used hoyer at baseline   Ambulation/Gait             General Gait Details: unable at evaluation, WC bound at baseline    Stairs            Wheelchair Mobility    Modified Rankin (Stroke Patients Only)       Balance Overall balance assessment: Needs assistance Sitting-balance support: Bilateral upper extremity supported;Feet supported Sitting balance-Leahy Scale: Fair       Standing balance-Leahy  Scale: Poor Standing balance comment: DNT, assuming based on measures of assist needed for bed mobility                             Cognition Arousal/Alertness: Awake/alert Behavior During Therapy: WFL for tasks assessed/performed;Anxious Overall Cognitive Status: Within Functional Limits for tasks assessed                                        Exercises General Exercises - Lower Extremity Heel Slides: Right;15 reps;Supine Hip ABduction/ADduction: Right;15 reps;Supine Straight Leg Raises: Right;15 reps;Supine Amputee Exercises Hip Extension: Left;15 reps;Supine Hip ABduction/ADduction: Left;15 reps;Supine Straight Leg Raises: Left;15 reps;Supine    General Comments        Pertinent Vitals/Pain Pain Assessment: 0-10 Pain Score: 5  Pain Location: L AKA site  Pain Descriptors / Indicators: Sharp;Tender Pain Intervention(s): Limited activity within patient's tolerance;Monitored during session;Repositioned    Home Living                      Prior Function            PT Goals (current goals can now be found in the care plan section) Acute Rehab PT Goals Patient Stated Goal: to get well, go home  PT Goal Formulation: With patient/family  Time For Goal Achievement: 03/23/17 Potential to Achieve Goals: Fair Progress towards PT goals: Progressing toward goals    Frequency    Min 2X/week      PT Plan Current plan remains appropriate    Co-evaluation              AM-PAC PT "6 Clicks" Daily Activity  Outcome Measure  Difficulty turning over in bed (including adjusting bedclothes, sheets and blankets)?: Unable Difficulty moving from lying on back to sitting on the side of the bed? : Unable Difficulty sitting down on and standing up from a chair with arms (e.g., wheelchair, bedside commode, etc,.)?: Unable Help needed moving to and from a bed to chair (including a wheelchair)?: Total Help needed walking in hospital room?:  Total Help needed climbing 3-5 steps with a railing? : Total 6 Click Score: 6    End of Session   Activity Tolerance: Patient limited by fatigue Patient left: in bed;with call bell/phone within reach   PT Visit Diagnosis: Unsteadiness on feet (R26.81);Muscle weakness (generalized) (M62.81);Other abnormalities of gait and mobility (R26.89);History of falling (Z91.81);Difficulty in walking, not elsewhere classified (R26.2)     Time: 1010-1030 PT Time Calculation (min) (ACUTE ONLY): 20 min  Charges:  $Therapeutic Exercise: 8-22 mins                    G Codes:  Functional Assessment Tool Used: AM-PAC 6 Clicks Basic Mobility;Clinical judgement    Deniece Ree PT, DPT, CBIS  Supplemental Physical Therapist [place]   Pager [phone]

## 2017-03-13 NOTE — Plan of Care (Signed)
  Progressing Education: Knowledge of General Education information will improve 03/13/2017 1012 - Progressing by Rance Muir, RN Health Behavior/Discharge Planning: Ability to manage health-related needs will improve 03/13/2017 1012 - Progressing by Rance Muir, RN Clinical Measurements: Ability to maintain clinical measurements within normal limits will improve 03/13/2017 1012 - Progressing by Rance Muir, RN Will remain free from infection 03/13/2017 1012 - Progressing by Rance Muir, RN Diagnostic test results will improve 03/13/2017 1012 - Progressing by Rance Muir, RN Respiratory complications will improve 03/13/2017 1012 - Progressing by Rance Muir, RN Cardiovascular complication will be avoided 03/13/2017 1012 - Progressing by Rance Muir, RN Activity: Risk for activity intolerance will decrease 03/13/2017 1012 - Progressing by Rance Muir, RN Nutrition: Adequate nutrition will be maintained 03/13/2017 1012 - Progressing by Rance Muir, RN Coping: Level of anxiety will decrease 03/13/2017 1012 - Progressing by Rance Muir, RN Elimination: Will not experience complications related to bowel motility 03/13/2017 1012 - Progressing by Rance Muir, RN Will not experience complications related to urinary retention 03/13/2017 1012 - Progressing by Rance Muir, RN Pain Managment: General experience of comfort will improve 03/13/2017 1012 - Progressing by Rance Muir, RN Safety: Ability to remain free from injury will improve 03/13/2017 1012 - Progressing by Rance Muir, RN Skin Integrity: Risk for impaired skin integrity will decrease 03/13/2017 1012 - Progressing by Rance Muir, RN Education: Knowledge of the prescribed therapeutic regimen will improve 03/13/2017 1012 - Progressing by Rance Muir, RN Ability to verbalize activity precautions or restrictions will improve 03/13/2017 1012 - Progressing by Rance Muir, RN Understanding of discharge needs will improve 03/13/2017 1012 - Progressing by Rance Muir,  RN Activity: Ability to perform//tolerate increased activity and mobilize with assistive devices will improve 03/13/2017 1012 - Progressing by Rance Muir, RN Clinical Measurements: Postoperative complications will be avoided or minimized 03/13/2017 1012 - Progressing by Rance Muir, RN Self-Care: Ability to meet self-care needs will improve 03/13/2017 1012 - Progressing by Rance Muir, RN Self-Concept: Ability to maintain and perform role responsibilities to the fullest extent possible will improve 03/13/2017 1012 - Progressing by Rance Muir, RN Pain Management: Pain level will decrease with appropriate interventions 03/13/2017 1012 - Progressing by Rance Muir, RN Skin Integrity: Demonstration of wound healing without infection will improve 03/13/2017 1012 - Progressing by Rance Muir, RN

## 2017-03-13 NOTE — Procedures (Signed)
Patient seen and examined on hemodialysis. QB 400 mL/ min UF goal 2L  Tolerating treatment well.  Treatment adjusted as needed.  Madelon Lips MD [facility] pgr [phone] 5:46 PM

## 2017-03-14 LAB — GLUCOSE, CAPILLARY
GLUCOSE-CAPILLARY: 153 mg/dL — AB (ref 65–99)
Glucose-Capillary: 130 mg/dL — ABNORMAL HIGH (ref 65–99)

## 2017-03-14 MED ORDER — OXYCODONE-ACETAMINOPHEN 5-325 MG PO TABS: 1.0000 | ORAL_TABLET | ORAL | 0 refills | Status: DC | PRN

## 2017-03-14 NOTE — Clinical Social Work Placement (Signed)
   CLINICAL SOCIAL WORK PLACEMENT  NOTE  Date:  03/14/2017  Patient Details  Name: [name] MRN: [id number] Date of Birth: 1955/09/18  Clinical Social Work is seeking post-discharge placement for this patient at the [place] level of care (*CSW will initial, date and re-position this form in  chart as items are completed):  Yes   Patient/family provided with [facility] Work Department's list of facilities offering this level of care within the geographic area requested by the patient (or if unable, by the patient's family).  Yes   Patient/family informed of their freedom to choose among providers that offer the needed level of care, that participate in Medicare, Medicaid or managed care program needed by the patient, have an available bed and are willing to accept the patient.  Yes   Patient/family informed of [facility]'s ownership interest in [facility] and [facility], as well as of the fact that they are under no obligation to receive care at these facilities.  PASRR submitted to EDS on       PASRR number received on       Existing PASRR number confirmed on 03/12/17     FL2 transmitted to all facilities in geographic area requested by pt/family on       FL2 transmitted to all facilities within larger geographic area on 03/12/17     Patient informed that his/her managed care company has contracts with or will negotiate with certain facilities, including the following:        Yes   Patient/family informed of bed offers received.  Patient chooses bed at [facility]     Physician recommends and patient chooses bed at      Patient to be transferred to [facility] on 03/14/17.  Patient to be transferred to facility by [facility]     Patient family notified on 03/14/17 of transfer.  Name of family member notified:  pt responsible for self     PHYSICIAN       Additional Comment:     _______________________________________________ Normajean Baxter, LCSW 03/14/2017, 10:26 AM

## 2017-03-14 NOTE — Social Work (Signed)
Clinical Social Worker facilitated patient discharge including contacting patient family and facility to confirm patient discharge plans.  Clinical information faxed to facility and family agreeable with plan.    CSW arranged ambulance transport via [facility] to [facility]  RN to call [phone] to give report prior to discharge. Ask for [address] Nurse, patient going to Room 104P.  Clinical Social Worker will sign off for now as social work intervention is no longer needed. Please consult [place] again if new need arises.  Elissa Hefty, LCSW Clinical Social Worker [phone]

## 2017-03-14 NOTE — Progress Notes (Signed)
Patient refused to be repositioned to allow this nurse to view his sacrum for redness or skin breakdown.

## 2017-03-14 NOTE — Social Work (Signed)
CSW met with patient and discussed SNF offers. Pt accepted SNF bed from [facility].  CSW contacted admission staff at [facility] and they confirmed bed offers. SNF will get Insurance Auth.  CSW sent dc summary to SNF as well.  CSW will follow for disposition.  Elissa Hefty, LCSW Clinical Social Worker [phone]

## 2017-03-14 NOTE — Discharge Summary (Signed)
Discharge Diagnoses:  Active Problems:   Pyogenic arthritis of left knee joint (HCC)   S/P AKA (above knee amputation) unilateral, left (HCC)   Pressure injury of skin   Surgeries: Procedure(s): LEFT ABOVE KNEE AMPUTATION on 03/09/2017    Consultants: Treatment Team:  Roney Jaffe, MD  Discharged Condition: Improved  Hospital Course: [name] is an 62 y.o. 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  Patient was brought to the operating room on 03/09/2017 and underwent Procedure(s): LEFT ABOVE KNEE AMPUTATION.    Patient was given perioperative antibiotics:  Anti-infectives (From admission, onward)   Start     Dose/Rate Route Frequency Ordered Stop   03/10/17 0800  ceFAZolin (ANCEF) IVPB 1 g/50 mL premix     1 g 100 mL/hr over 30 Minutes Intravenous  Once 03/09/17 1509 03/10/17 1246   03/09/17 0900  ceFAZolin (ANCEF) IVPB 2g/100 mL premix  Status:  Discontinued     2 g 200 mL/hr over 30 Minutes Intravenous Every 6 hours 03/09/17 0855 03/09/17 1506   03/09/17 0618  ceFAZolin (ANCEF) 2-4 GM/100ML-% IVPB    Comments:  Block, Sarah   : cabinet override      03/09/17 0618 03/09/17 0753   03/09/17 0616  ceFAZolin (ANCEF) IVPB 2g/100 mL premix     2 g 200 mL/hr over 30 Minutes Intravenous On call to O.R. 03/09/17 0017 03/09/17 0753    .  Patient was given sequential compression devices, early ambulation, and aspirin for DVT prophylaxis.  Recent vital signs:  Patient Vitals for the past 24 hrs:  BP Temp Temp src Pulse Resp SpO2 Weight  03/14/17 0525 (!) 101/50 98.3 F (36.8 C) Oral 66 16 98 % -  03/13/17 2010 (!) 103/52 98.3 F (36.8 C) Oral 73 16 98 % -  03/13/17 1700 92/65 - - (!) 56 - - -  03/13/17 1630 (!) 89/53 - - (!) 57 - - -  03/13/17 1600 (!) 95/49 - - 67 - - -  03/13/17 1530 (!) 82/52 - - 68 - - -   03/13/17 1500 (!) 82/49 - - 70 - - -  03/13/17 1430 (!) 92/54 - - 72 - - -  03/13/17 1400 (!) 93/55 - - 70 - - -  03/13/17 1330 (!) 96/58 - - 72 - - -  03/13/17 1315 92/68 - - 69 - - -  03/13/17 1306 (!) 93/59 - - 71 - - -  03/13/17 1300 95/60 98 F (36.7 C) Oral 70 18 99 % 263 lb 7.2 oz (119.5 kg)  .  Recent laboratory studies: No results found.  Discharge Medications:   Allergies as of 03/14/2017   No Known Allergies     Medication List    STOP taking these medications   clindamycin 300 MG capsule Commonly known as:  CLEOCIN   collagenase ointment Commonly known as:  SANTYL     TAKE these medications   acetaminophen 500 MG tablet Commonly known as:  TYLENOL Take 500 mg by mouth every 6 (six) hours as needed for moderate pain.   atorvastatin 40 MG tablet Commonly known as:  LIPITOR TAKE 1 TABLET (40 MG TOTAL) BY MOUTH DAILY.   ferrous sulfate 325 (65 FE) MG tablet Take 1 tablet (325 mg total) by mouth 2 (two) times daily.   gabapentin 100 MG capsule Commonly known as:  NEURONTIN TAKE 1 CAPSULE (100 MG TOTAL) BY MOUTH 3 (THREE) TIMES DAILY.   HYDROCODONE-ACETAMINOPHEN PO Take 1 tablet by mouth 2 (two) times daily as needed (pain).   BASAGLAR KWIKPEN 100 UNIT/ML Sopn Inject 30 Units into the skin at bedtime.   insulin glargine 100 UNIT/ML injection Commonly known as:  LANTUS Inject 0.28 mLs (28 Units total) into the skin at bedtime.   levothyroxine 200 MCG tablet Commonly known as:  SYNTHROID, LEVOTHROID Take 1 tablet (200 mcg total) by mouth every morning. What changed:  how much to take   midodrine 5 MG tablet Commonly known as:  PROAMATINE Take 5 mg by mouth 3 (three) times daily with meals.   multivitamins ther. w/minerals Tabs tablet Take 1 tablet by mouth daily.   NOVOLOG 100 UNIT/ML injection Generic drug:  insulin aspart Inject as directed 2 (two) times daily. Sliding scale   oxyCODONE-acetaminophen 5-325 MG tablet Commonly known as:   PERCOCET/ROXICET Take 1 tablet by mouth 2 (two) times daily as needed for severe pain. What changed:  Another medication with the same name was added. Make sure you understand how and when to take each.   oxyCODONE-acetaminophen 5-325 MG tablet Commonly known as:  PERCOCET/ROXICET Take 1 tablet by mouth every 6 (six) hours as needed for severe pain. What changed:  Another medication with the same name was added. Make sure you understand how and when to take each.   oxyCODONE-acetaminophen 5-325 MG tablet Commonly known as:  PERCOCET/ROXICET Take 1 tablet by mouth every 4 (four) hours as needed for severe pain. What changed:  You were already taking a medication with the same name, and this prescription was added. Make sure you understand how and when to take each.   sevelamer carbonate 800 MG tablet Commonly known as:  RENVELA Take 800-1,600 mg by mouth 3 (three) times daily with meals.   VITAMIN C PO Take 1 tablet by mouth 2 (two) times daily.            Discharge Care Instructions  (From admission, onward)        Start     Ordered   03/14/17 0000  Change dressing     03/14/17 0643      Diagnostic Studies: Dg Hip Unilat W Or Wo Pelvis 2-3 Views Right  Result Date: 02/24/2017 CLINICAL DATA:  Worsening right hip pain for several days. No known injury. EXAM: DG HIP (WITH OR WITHOUT PELVIS) 2-3V RIGHT COMPARISON:  None. FINDINGS: Technically suboptimal due to large patient habitus. Bipolar right hip prosthesis is seen as well as femoral fixation plate and screws. No evidence of fracture or dislocation. Left hip prosthesis also noted. IMPRESSION: No acute findings. Electronically Signed   By: Earle Gell M.D.   On: 02/24/2017 13:57   Dg Femur Min 2 Views Right  Result Date: 02/24/2017 CLINICAL DATA:  Right femur pain for several days.  No known injury. EXAM: RIGHT FEMUR 2 VIEWS COMPARISON:  12/20/2016 FINDINGS: Right hip prosthesis is seen as well as internal fixation plate  and screws in the mid and distal femur. Comminuted fracture of the distal femoral metaphysis is seen showing increased sclerosis, consistent with partial interval healing. Mild medial displacement and anterior angulation of the distal fracture fragment appears stable. Generalized osteopenia noted as well as extensive peripheral vascular calcification. IMPRESSION: No acute findings. Partial interval healing of comminuted fracture of distal femoral metaphysis. Stable mild medial displacement and anterior angulation of distal fracture fragment. Electronically Signed   By: Jenny Reichmann  Kris Hartmann M.D.   On: 02/24/2017 14:00    Patient benefited maximally from their hospital stay and there were no complications.     Disposition: 01-Home or Self Care Discharge Instructions    Call MD / Call 911   Complete by:  As directed    If you experience chest pain or shortness of breath, CALL 911 and be transported to the hospital emergency room.  If you develope a fever above 101 F, pus (white drainage) or increased drainage or redness at the wound, or calf pain, call your surgeon's office.   Change dressing   Complete by:  As directed    Change dressing as needed   Constipation Prevention   Complete by:  As directed    Drink plenty of fluids.  Prune juice may be helpful.  You may use a stool softener, such as Colace (over the counter) 100 mg twice a day.  Use MiraLax (over the counter) for constipation as needed.   Diet - low sodium heart healthy   Complete by:  As directed    Increase activity slowly as tolerated   Complete by:  As directed      Follow-up Information    Newt Minion, MD Follow up in 1 week(s).   Specialty:  Orthopedic Surgery Contact information: [address] [place] [address] [phone]            Signed: Newt Minion 03/14/2017, 6:43 AM

## 2017-03-14 NOTE — Progress Notes (Signed)
Report given to Quillian Quince at [facility]. All questions/concerns addressed. IV removed, catheter tip intact. Wound vac removed and aquacel placed with no issues. No s/s of distress. VSS. Awaiting [facility] transport to SNF for discharge.

## 2017-03-14 NOTE — Progress Notes (Signed)
[facility] Progress Note   Subjective:   No new complaints. Patient ready to be discharged.  Objective Vitals:   03/13/17 1630 03/13/17 1700 03/13/17 2010 03/14/17 0525  BP: (!) 89/53 92/65 (!) 103/52 (!) 101/50  Pulse: (!) 57 (!) 56 73 66  Resp:   16 16  Temp:   98.3 F (36.8 C) 98.3 F (36.8 C)  TempSrc:   Oral Oral  SpO2:   98% 98%  Weight:      Height:       Physical Exam General:NAD, obese male resting in bed Heart:RRR, +0/1 systolic murmur Lungs:CTAB Abdomen:obese, soft, NTND, +BS Extremities:L AKA w/wound vac in place, RLE- no edema, +chronic venous changes Dialysis Access: RU AVF cannulated   Filed Weights   03/10/17 0845 03/10/17 1253 03/13/17 1300  Weight: 119.1 kg (262 lb 9.1 oz) 117.9 kg (259 lb 14.8 oz) 119.5 kg (263 lb 7.2 oz)    Intake/Output Summary (Last 24 hours) at 03/14/2017 1051 Last data filed at 03/13/2017 2008 Gross per 24 hour  Intake -  Output 2402 ml  Net -2402 ml    Additional Objective Labs: Basic Metabolic Panel: Recent Labs  Lab 03/10/17 0855 03/12/17 0723 03/13/17 1157  NA 132* 132* 132*  K 5.3* 4.9 5.2*  CL 98* 95* 95*  CO2 20* 21* 20*  GLUCOSE 181* 121* 106*  BUN 49* 54* 70*  CREATININE 5.94* 6.17* 8.13*  CALCIUM 7.1* 7.2* 7.3*  PHOS 3.6 4.0 4.5   Liver Function Tests: Recent Labs  Lab 03/10/17 0855 03/12/17 0723 03/13/17 1157  ALBUMIN 2.7* 2.5* 2.5*   No results for input(s): LIPASE, AMYLASE in the last 168 hours. CBC: Recent Labs  Lab 03/10/17 0854 03/11/17 1204 03/12/17 0723 03/13/17 1157  WBC 5.4  --  5.8 7.1  HGB 6.6* 8.2* 8.8* 8.4*  HCT 22.3* 26.7* 28.4* 27.1*  MCV 97.4  --  94.4 94.8  PLT 127*  --  131* 135*    CBG: Recent Labs  Lab 03/13/17 0715 03/13/17 1207 03/13/17 1955 03/13/17 2138 03/14/17 0633  GLUCAP 112* 113* 160* 180* 130*    Lab Results  Component Value Date   INR 1.78 (H) 03/07/2013   INR 2.59 (H) 01/03/2013   INR 1.84 (H) 12/09/2012   Medications: .  sodium chloride    . sodium chloride    . methocarbamol (ROBAXIN)  IV     . aspirin EC  325 mg Oral Daily  . atorvastatin  40 mg Oral Daily  . [START ON 03/15/2017] darbepoetin (ARANESP) injection - DIALYSIS  150 mcg Intravenous Q Thu-HD  . docusate sodium  100 mg Oral BID  . gabapentin  100 mg Oral TID  . insulin aspart  0-9 Units Subcutaneous TID WC  . insulin aspart  3 Units Subcutaneous TID WC  . insulin glargine  20 Units Subcutaneous QHS  . levothyroxine  225 mcg Oral QAC breakfast  . midodrine  10 mg Oral TID WC  . sevelamer carbonate  1,600 mg Oral TID WC    Dialysis Orders: [facility] pt Dialysis on [facility]  4h 33min 118kg (259lb) 2/2.5 bath Hep none RUA AVF NO VIT D  Mircera 200 q 2wks Last given 03/08/17    Assessment/Plan: 1.Osteomyelitis L tibia & L total knee arthroplasty - s/p L AKA Dr. Sharol Given on 03/09/17. Wound vac in place. To be discharged to SNF. 2. ESRD -HD yesterday tolerated well. Will continue per regular schedule at outpatient facility. 3. Anemia of CKD-Hgb variable,  last 8.2,  S/p 2 Units PRBC 1/12. 4. Secondary hyperparathyroidism -last Cor Ca 8.5, P 4.5. Continue binders. Not on VDRA 5. Hypotension/Volume- Chronically soft, on midodrine.Titrate down volume as tolerated. Does not appear volume overloaded on exam.  6. Nutrition -Alb 2.5, Renal diet with fluid restriction, Renavite, Nepro TID 7. DMT2 - per primary 8. Dispo - ok to d/c from renal standpoint    Jen Mow, PA-C [facility] Pager: [phone] 03/14/2017,10:51 AM  LOS: 5 days

## 2017-03-14 NOTE — Care Management Important Message (Signed)
Important Message  Patient Details  Name: [name] MRN: [id number] Date of Birth: [birth year]   Medicare Important Message Given:  Yes    Orbie Pyo 03/14/2017, 2:21 PM

## 2017-03-26 ENCOUNTER — Encounter (INDEPENDENT_AMBULATORY_CARE_PROVIDER_SITE_OTHER): Payer: Self-pay | Admitting: Orthopedic Surgery

## 2017-03-26 ENCOUNTER — Ambulatory Visit (INDEPENDENT_AMBULATORY_CARE_PROVIDER_SITE_OTHER): Payer: Medicare Other | Admitting: Orthopedic Surgery

## 2017-03-26 VITALS — Ht 72.0 in | Wt 263.0 lb

## 2017-03-26 DIAGNOSIS — Z89612 Acquired absence of left leg above knee: Secondary | ICD-10-CM

## 2017-03-26 NOTE — Progress Notes (Signed)
Office Visit Note   Patient: [name]           Date of Birth: 11/07/1955           MRN: [id number] Visit Date: 03/26/2017              Requested by: No referring provider defined for this encounter. PCP: Patient, No Pcp Per  Chief Complaint  Patient presents with  . Left Leg - Routine Post Op    03/09/17 left above the knee amputation       HPI: Patient presents status post left above the knee amputation on 03/09/17.  Patient does have end-stage renal disease on dialysis Tuesday Thursday Saturday.  Currently is at [facility].   Assessment & Plan: Visit Diagnoses:  No diagnosis found.  Plan: Sutures and staples harvested today. Encouraged him to continue with PT for upper extremity strengthening. Given an order for a shrinker for wear on L AKA daily.   Follow-Up Instructions: No Follow-up on file.   Ortho Exam  Patient is alert, oriented, no adenopathy, well-dressed, normal affect, normal respiratory effort. Examination patient's left above knee amputation is healing well. No gaping, drainage, redness. No sign of infection.   Imaging: No results found. No images are attached to the encounter.  Labs: Lab Results  Component Value Date   HGBA1C 5.1 03/09/2017   HGBA1C 5.3 03/20/2016   HGBA1C 5.3 04/29/2015   ESRSEDRATE 67 (H) 12/31/2016   ESRSEDRATE 52 (H) 12/27/2016   ESRSEDRATE 57 (H) 06/13/2010   CRP 4.3 (H) 12/31/2016   CRP 10.4 (H) 12/27/2016   CRP 4.8 (H) 24-Nov-202011   LABURIC 8.3 (H) 11/25/2015   LABURIC 6.2 10/10/2008   LABURIC 5.6 03/10/2008   REPTSTATUS 01/02/2017 FINAL 12/28/2016   GRAMSTAIN  08/16/2012    NO WBC SEEN MODERATE SQUAMOUS EPITHELIAL CELLS PRESENT MODERATE YEAST   CULT NO GROWTH 5 DAYS 12/28/2016   LABORGA ESCHERICHIA COLI (A) 12/27/2016    @LABSALLVALUES (HGBA1)@  Body mass index is 35.67 kg/m.  Orders:  No orders of the defined types were placed in this encounter.  No orders of the defined types were placed in  this encounter.    Procedures: No procedures performed  Clinical Data: No additional findings.  ROS:  All other systems negative, except as noted in the HPI. Review of Systems  Constitutional: Negative for chills and fever.  Skin: Negative for color change and wound.    Objective: Vital Signs: Ht 6' (1.829 m)   Wt 263 lb (119.3 kg)   BMI 35.67 kg/m   Specialty Comments:  No specialty comments available.  PMFS History: Patient Active Problem List   Diagnosis Date Noted  . Pressure injury of skin 03/12/2017  . S/P AKA (above knee amputation) unilateral, left ([place]) 03/09/2017  . Pyogenic arthritis of left knee joint ([place])   . Subacute osteomyelitis of left tibia ([place]) 03/05/2017  . Traumatic closed nondisplaced fracture of neck of femur, left, sequela 03/05/2017  . Closed fracture of lower end of right femur with routine healing 03/05/2017  . DDD (degenerative disc disease), lumbosacral   . Morbid obesity ([place])   . Anemia of chronic disease   . Thrombocytopenia ([place])   . Idiopathic chronic venous hypertension of right lower extremity with inflammation   . Lower back pain 12/27/2016  . UTI (urinary tract infection) 12/27/2016  . Non-pressure chronic ulcer of left calf, limited to breakdown of skin ([place]) 02/07/2016  . Hypotension, unspecified 11/24/2015  . Left knee  pain 11/24/2015  . Hypotension 11/24/2015  . Decubitus ulcer of buttock 01/22/2015  . S/P BKA (below knee amputation) unilateral ([place]) 01/22/2015  . Chronic venous insufficiency 09/08/2014  . Diabetes mellitus, type 2 ([place]) 09/08/2014  . ESRD on dialysis ([place]) 09/08/2014  . Hyperlipidemia 12/23/2013  . Diabetic foot ([place]) 11/20/2013  . Chronic pain 05/21/2013  . Chronic interstitial cystitis 02/06/2013  . UI (urinary incontinence) 10/29/2012  . CKD (chronic kidney disease) stage 3, GFR 30-59 ml/min (HCC) 08/18/2012  . Cellulitis of left leg 08/14/2012  . Hyperkalemia 08/04/2012  . Hypothyroidism,  acquired 08/04/2012  . OBESITY 09/21/2009  . Essential hypertension 09/21/2009  . DIVERTICULOSIS OF COLON 09/21/2009  . Type 2 diabetes mellitus with diabetic nephropathy, with long-term current use of insulin ([place]) 10/01/2007  . DVT 10/01/2007   Past Medical History:  Diagnosis Date  . Anemia   . Anxiety   . AR (allergic rhinitis)   . Arthritis    osteoarthritis  . Blood transfusion   . BPH (benign prostatic hypertrophy)   . Cholelithiasis   . Chronic kidney disease    hx of kidney stones  . Chronic pain   . Diabetes mellitus    insulin dependent  . Displacement of lumbar intervertebral disc without myelopathy    L4-L5  . Diverticulosis of colon   . DVT (deep venous thrombosis) (HCC)    Lower Extremity  . Embolism and thrombosis of unspecified artery ([place])   . ESRD (end stage renal disease) ([place])   . Gallstone   . History of nephrolithiasis    Bilateral  . Hyperlipidemia   . Hypertension   . Hypertrophy of prostate   . Hypothyroidism   . Impotence of organic origin   . Morbid obesity ([place])   . Nephrolithiasis   . Osteomyelitis ([place])    left tibia and left knee  . Other lymphedema   . Pancreatitis, acute   . Peripheral vascular disease ([place])   . Personal history of arthritis    Osteoarthritis  . Sleep apnea    uses cpap  . Unspecified septicemia(038.9) ([place])     Family History  Problem Relation Age of Onset  . Diabetes Mother   . Heart attack Mother   . Stroke Mother   . Colon cancer Father   . Dementia Father   . Heart attack Father   . Arthritis Paternal Grandmother   . Hypertension Other   . Hypothyroidism Other     Past Surgical History:  Procedure Laterality Date  . AMPUTATION  02/07/2011   Procedure: AMPUTATION BELOW KNEE;  Surgeon: Newt Minion, MD;  Location: [facility];  Service: Orthopedics;  Laterality: Left;  Left Below Knee Amputation  . AMPUTATION Left 03/09/2017   Procedure: LEFT ABOVE KNEE AMPUTATION;  Surgeon: Newt Minion, MD;   Location: [facility];  Service: Orthopedics;  Laterality: Left;  . bilteral hip     replacement & revison  's   . CYSTOSCOPY W/ RETROGRADES Bilateral 10/24/2012   Procedure: CYSTOSCOPY WITH RETROGRADE PYELOGRAM Procedure: Cystoscopy, Bilateral Retrogarde Pyelograms, Bladder Biopsy, Hydrodistension;  Surgeon: Franchot Gallo, MD;  Location: [facility];  Service: Urology;  Laterality: Bilateral;  . KIDNEY SURGERY    . KNEE SURGERY     left knee    . LEG AMPUTATION BELOW KNEE    . REPLACEMENT TOTAL KNEE    . TONSILLECTOMY     Social History   Occupational History  . Not on file  Tobacco Use  . Smoking status:  Never Smoker  . Smokeless tobacco: Never Used  Substance and Sexual Activity  . Alcohol use: No  . Drug use: No  . Sexual activity: Yes

## 2017-04-09 ENCOUNTER — Encounter (INDEPENDENT_AMBULATORY_CARE_PROVIDER_SITE_OTHER): Payer: Self-pay | Admitting: Orthopedic Surgery

## 2017-04-09 ENCOUNTER — Ambulatory Visit (INDEPENDENT_AMBULATORY_CARE_PROVIDER_SITE_OTHER): Payer: Medicare Other | Admitting: Orthopedic Surgery

## 2017-04-09 VITALS — Ht 72.0 in | Wt 263.0 lb

## 2017-04-09 DIAGNOSIS — Z89612 Acquired absence of left leg above knee: Secondary | ICD-10-CM

## 2017-04-09 NOTE — Progress Notes (Signed)
Office Visit Note   Patient: [name]           Date of Birth: June 19, 1955           MRN: [id number] Visit Date: 04/09/2017              Requested by: No referring provider defined for this encounter. PCP: Patient, No Pcp Per  Chief Complaint  Patient presents with  . Left Leg - Routine Post Op    03/09/17 left AKA      HPI: Patient is a 62 year old gentleman who is seen in follow-up for left above-knee amputation and a right heel decubitus ulcer.  He is currently wearing a PRAFO on the right.  Assessment & Plan: Visit Diagnoses:  1. S/P AKA (above knee amputation) unilateral, left (HCC)     Plan: The sutures are harvested from the left AKA.  Patient previously been provided a prescription for [facility] for a stump shrinker on the left.  He is encouraged with the skilled nursing facility to get this prescription filled.  He is to continue wearing the [facility] on the right to protect the heel from further ulcerations. Follow-Up Instructions: Return in about 4 weeks (around 05/07/2017).   Ortho Exam  Patient is alert, oriented, no adenopathy, well-dressed, normal affect, normal respiratory effort. Examination the surgical incision is well-healed there is no drainage no cellulitis no signs of infection from the AKA.  The right heel has a little bit of callus on the heel but there is no open wound no signs of infection.  Imaging: No results found. No images are attached to the encounter.  Labs: Lab Results  Component Value Date   HGBA1C 5.1 03/09/2017   HGBA1C 5.3 03/20/2016   HGBA1C 5.3 04/29/2015   ESRSEDRATE 67 (H) 12/31/2016   ESRSEDRATE 52 (H) 12/27/2016   ESRSEDRATE 57 (H) 06/13/2010   CRP 4.3 (H) 12/31/2016   CRP 10.4 (H) 12/27/2016   CRP 4.8 (H) August 10, 202011   LABURIC 8.3 (H) 11/25/2015   LABURIC 6.2 10/10/2008   LABURIC 5.6 03/10/2008   REPTSTATUS 01/02/2017 FINAL 12/28/2016   GRAMSTAIN  08/16/2012    NO WBC SEEN MODERATE SQUAMOUS EPITHELIAL CELLS  PRESENT MODERATE YEAST   CULT NO GROWTH 5 DAYS 12/28/2016   LABORGA ESCHERICHIA COLI (A) 12/27/2016    @LABSALLVALUES (HGBA1)@  Body mass index is 35.67 kg/m.  Orders:  No orders of the defined types were placed in this encounter.  No orders of the defined types were placed in this encounter.    Procedures: No procedures performed  Clinical Data: No additional findings.  ROS:  All other systems negative, except as noted in the HPI. Review of Systems  Objective: Vital Signs: Ht 6' (1.829 m)   Wt 263 lb (119.3 kg)   BMI 35.67 kg/m   Specialty Comments:  No specialty comments available.  PMFS History: Patient Active Problem List   Diagnosis Date Noted  . Pressure injury of skin 03/12/2017  . S/P AKA (above knee amputation) unilateral, left ([place]) 03/09/2017  . Closed fracture of lower end of right femur with routine healing 03/05/2017  . DDD (degenerative disc disease), lumbosacral   . Morbid obesity ([place])   . Anemia of chronic disease   . Thrombocytopenia ([place])   . Idiopathic chronic venous hypertension of right lower extremity with inflammation   . Lower back pain 12/27/2016  . UTI (urinary tract infection) 12/27/2016  . Hypotension, unspecified 11/24/2015  . Left knee pain 11/24/2015  . Hypotension  11/24/2015  . Decubitus ulcer of buttock 01/22/2015  . Chronic venous insufficiency 09/08/2014  . Diabetes mellitus, type 2 ([place]) 09/08/2014  . ESRD on dialysis ([place]) 09/08/2014  . Hyperlipidemia 12/23/2013  . Diabetic foot ([place]) 11/20/2013  . Chronic pain 05/21/2013  . Chronic interstitial cystitis 02/06/2013  . UI (urinary incontinence) 10/29/2012  . CKD (chronic kidney disease) stage 3, GFR 30-59 ml/min (HCC) 08/18/2012  . Hyperkalemia 08/04/2012  . Hypothyroidism, acquired 08/04/2012  . OBESITY 09/21/2009  . Essential hypertension 09/21/2009  . DIVERTICULOSIS OF COLON 09/21/2009  . Type 2 diabetes mellitus with diabetic nephropathy, with long-term  current use of insulin ([place]) 10/01/2007  . DVT 10/01/2007   Past Medical History:  Diagnosis Date  . Anemia   . Anxiety   . AR (allergic rhinitis)   . Arthritis    osteoarthritis  . Blood transfusion   . BPH (benign prostatic hypertrophy)   . Cellulitis of left leg 08/14/2012  . Cholelithiasis   . Chronic kidney disease    hx of kidney stones  . Chronic pain   . Diabetes mellitus    insulin dependent  . Displacement of lumbar intervertebral disc without myelopathy    L4-L5  . Diverticulosis of colon   . DVT (deep venous thrombosis) (HCC)    Lower Extremity  . Embolism and thrombosis of unspecified artery ([place])   . ESRD (end stage renal disease) ([place])   . Gallstone   . History of nephrolithiasis    Bilateral  . Hyperlipidemia   . Hypertension   . Hypertrophy of prostate   . Hypothyroidism   . Impotence of organic origin   . Morbid obesity ([place])   . Nephrolithiasis   . Non-pressure chronic ulcer of left calf, limited to breakdown of skin ([place]) 02/07/2016  . Osteomyelitis ([place])    left tibia and left knee  . Other lymphedema   . Pancreatitis, acute   . Peripheral vascular disease ([place])   . Personal history of arthritis    Osteoarthritis  . Pyogenic arthritis of left knee joint ([place])   . S/P BKA (below knee amputation) unilateral ([place]) 01/22/2015  . Sleep apnea    uses cpap  . Subacute osteomyelitis of left tibia ([place]) 03/05/2017  . Traumatic closed nondisplaced fracture of neck of femur, left, sequela 03/05/2017  . Unspecified septicemia(038.9) ([place])     Family History  Problem Relation Age of Onset  . Diabetes Mother   . Heart attack Mother   . Stroke Mother   . Colon cancer Father   . Dementia Father   . Heart attack Father   . Arthritis Paternal Grandmother   . Hypertension Other   . Hypothyroidism Other     Past Surgical History:  Procedure Laterality Date  . AMPUTATION  02/07/2011   Procedure: AMPUTATION BELOW KNEE;  Surgeon: Newt Minion, MD;   Location: [facility];  Service: Orthopedics;  Laterality: Left;  Left Below Knee Amputation  . AMPUTATION Left 03/09/2017   Procedure: LEFT ABOVE KNEE AMPUTATION;  Surgeon: Newt Minion, MD;  Location: [facility];  Service: Orthopedics;  Laterality: Left;  . bilteral hip     replacement & revison  's   . CYSTOSCOPY W/ RETROGRADES Bilateral 10/24/2012   Procedure: CYSTOSCOPY WITH RETROGRADE PYELOGRAM Procedure: Cystoscopy, Bilateral Retrogarde Pyelograms, Bladder Biopsy, Hydrodistension;  Surgeon: Franchot Gallo, MD;  [facility];  Service: Urology;  Laterality: Bilateral;  . KIDNEY SURGERY    . KNEE SURGERY     left knee    .  LEG AMPUTATION BELOW KNEE    . REPLACEMENT TOTAL KNEE    . TONSILLECTOMY     Social History   Occupational History  . Not on file  Tobacco Use  . Smoking status: Never Smoker  . Smokeless tobacco: Never Used  Substance and Sexual Activity  . Alcohol use: No  . Drug use: No  . Sexual activity: Yes

## 2017-04-16 ENCOUNTER — Telehealth (INDEPENDENT_AMBULATORY_CARE_PROVIDER_SITE_OTHER): Payer: Self-pay | Admitting: Orthopedic Surgery

## 2017-04-16 NOTE — Telephone Encounter (Signed)
Anderson Malta (PT) with [facility] called needing verbal orders for HHPT twice a week for the next 6 weeks. Also, Anderson Malta need clarification orders on weight bearing status on right lower extremity. The number to contact Anderson Malta is [phone]

## 2017-04-16 NOTE — Telephone Encounter (Signed)
Called Ala Bent back and gave verbal ok for [facility] visits.

## 2017-04-16 NOTE — Telephone Encounter (Signed)
Turner with [facility] called needing verbal orders  For skilled nursing 3 wk 4 and 2 wk 5  The number to contact Cloyde Reams is [phone]

## 2017-04-16 NOTE — Telephone Encounter (Signed)
Please advise 

## 2017-04-17 NOTE — Telephone Encounter (Signed)
Called and sw Anderson Malta to advise of orders below.

## 2017-04-30 ENCOUNTER — Telehealth (INDEPENDENT_AMBULATORY_CARE_PROVIDER_SITE_OTHER): Payer: Self-pay | Admitting: Orthopedic Surgery

## 2017-04-30 NOTE — Telephone Encounter (Signed)
Called and lm on vm for therapist to advise verbal ok for slide board and wheelchair cushion

## 2017-04-30 NOTE — Telephone Encounter (Signed)
Cyndy Freeze -(OT) with [facility] called  advised patient need medical equipment. She asked if order can be faxed over to [facility] for new sliding broad, new wheel chair cushion 22' 18 for pressure release. The number to contact Katharine Look is [phone]

## 2017-05-07 ENCOUNTER — Ambulatory Visit (INDEPENDENT_AMBULATORY_CARE_PROVIDER_SITE_OTHER): Payer: Medicare Other | Admitting: Orthopedic Surgery

## 2017-05-15 ENCOUNTER — Ambulatory Visit (INDEPENDENT_AMBULATORY_CARE_PROVIDER_SITE_OTHER): Payer: Medicare Other | Admitting: Orthopedic Surgery

## 2017-05-16 ENCOUNTER — Encounter (INDEPENDENT_AMBULATORY_CARE_PROVIDER_SITE_OTHER): Payer: Self-pay | Admitting: Orthopedic Surgery

## 2017-05-16 ENCOUNTER — Other Ambulatory Visit (INDEPENDENT_AMBULATORY_CARE_PROVIDER_SITE_OTHER): Payer: Self-pay

## 2017-05-16 ENCOUNTER — Ambulatory Visit (INDEPENDENT_AMBULATORY_CARE_PROVIDER_SITE_OTHER): Payer: Medicare Other | Admitting: Orthopedic Surgery

## 2017-05-16 VITALS — Ht 72.0 in | Wt 263.0 lb

## 2017-05-16 DIAGNOSIS — Z89612 Acquired absence of left leg above knee: Secondary | ICD-10-CM

## 2017-05-16 MED ORDER — OXYCODONE-ACETAMINOPHEN 5-325 MG PO TABS: 1.0000 | ORAL_TABLET | Freq: Four times a day (QID) | ORAL | 0 refills | Status: DC | PRN

## 2017-05-16 NOTE — Progress Notes (Signed)
Office Visit Note   Patient: [name]           Date of Birth: 03-25-1955           MRN: [id number] Visit Date: 05/16/2017              Requested by: No referring provider defined for this encounter. PCP: Patient, No Pcp Per  Chief Complaint  Patient presents with  . Left Leg - Routine Post Op    03/09/17 left above the knee amputation       HPI: Patient is a 62 year old gentleman status post left above-the-knee amputation about 2-1/2 months out.  Patient states he feels well no concerns he is currently not wearing his stump shrinker.  Patient also has venous insufficiency but is not wearing his compression stockings.  Assessment & Plan: Visit Diagnoses:  1. S/P AKA (above knee amputation) unilateral, left (HCC)     Plan: Recommended that he obtain compression stockings to wear daily for the right lower extremity recommend that he wear the stump shrinker.  Anticipate he could obtain a prosthetic fitting in about a month.  He will follow-up with [facility].  He will follow-up here once he has his prosthesis.  Follow-Up Instructions: Return if symptoms worsen or fail to improve.   Ortho Exam  Patient is alert, oriented, no adenopathy, well-dressed, normal affect, normal respiratory effort. Examination the above-knee amputation is healed well he has no redness no cellulitis there is swelling there is still some swelling that needs consolidation with the stump shrinker.  Imaging: No results found. No images are attached to the encounter.  Labs: Lab Results  Component Value Date   HGBA1C 5.1 03/09/2017   HGBA1C 5.3 03/20/2016   HGBA1C 5.3 04/29/2015   ESRSEDRATE 67 (H) 12/31/2016   ESRSEDRATE 52 (H) 12/27/2016   ESRSEDRATE 57 (H) 06/13/2010   CRP 4.3 (H) 12/31/2016   CRP 10.4 (H) 12/27/2016   CRP 4.8 (H) 01-04-2010   LABURIC 8.3 (H) 11/25/2015   LABURIC 6.2 10/10/2008   LABURIC 5.6 03/10/2008   REPTSTATUS 01/02/2017 FINAL 12/28/2016   GRAMSTAIN  08/16/2012    NO  WBC SEEN MODERATE SQUAMOUS EPITHELIAL CELLS PRESENT MODERATE YEAST   CULT NO GROWTH 5 DAYS 12/28/2016   LABORGA ESCHERICHIA COLI (A) 12/27/2016    @LABSALLVALUES (HGBA1)@  Body mass index is 35.67 kg/m.  Orders:  No orders of the defined types were placed in this encounter.  No orders of the defined types were placed in this encounter.    Procedures: No procedures performed  Clinical Data: No additional findings.  ROS:  All other systems negative, except as noted in the HPI. Review of Systems  Objective: Vital Signs: Ht 6' (1.829 m)   Wt 263 lb (119.3 kg)   BMI 35.67 kg/m   Specialty Comments:  No specialty comments available.  PMFS History: Patient Active Problem List   Diagnosis Date Noted  . Pressure injury of skin 03/12/2017  . S/P AKA (above knee amputation) unilateral, left ([place]) 03/09/2017  . Closed fracture of lower end of right femur with routine healing 03/05/2017  . DDD (degenerative disc disease), lumbosacral   . Morbid obesity ([place])   . Anemia of chronic disease   . Thrombocytopenia ([place])   . Idiopathic chronic venous hypertension of right lower extremity with inflammation   . Lower back pain 12/27/2016  . UTI (urinary tract infection) 12/27/2016  . Hypotension, unspecified 11/24/2015  . Left knee pain 11/24/2015  . Hypotension 11/24/2015  .  Decubitus ulcer of buttock 01/22/2015  . Chronic venous insufficiency 09/08/2014  . Diabetes mellitus, type 2 ([place]) 09/08/2014  . ESRD on dialysis ([place]) 09/08/2014  . Hyperlipidemia 12/23/2013  . Diabetic foot ([place]) 11/20/2013  . Chronic pain 05/21/2013  . Chronic interstitial cystitis 02/06/2013  . UI (urinary incontinence) 10/29/2012  . CKD (chronic kidney disease) stage 3, GFR 30-59 ml/min (HCC) 08/18/2012  . Hyperkalemia 08/04/2012  . Hypothyroidism, acquired 08/04/2012  . OBESITY 09/21/2009  . Essential hypertension 09/21/2009  . DIVERTICULOSIS OF COLON 09/21/2009  . Type 2 diabetes mellitus  with diabetic nephropathy, with long-term current use of insulin ([place]) 10/01/2007  . DVT 10/01/2007   Past Medical History:  Diagnosis Date  . Anemia   . Anxiety   . AR (allergic rhinitis)   . Arthritis    osteoarthritis  . Blood transfusion   . BPH (benign prostatic hypertrophy)   . Cellulitis of left leg 08/14/2012  . Cholelithiasis   . Chronic kidney disease    hx of kidney stones  . Chronic pain   . Diabetes mellitus    insulin dependent  . Displacement of lumbar intervertebral disc without myelopathy    L4-L5  . Diverticulosis of colon   . DVT (deep venous thrombosis) (HCC)    Lower Extremity  . Embolism and thrombosis of unspecified artery ([place])   . ESRD (end stage renal disease) ([place])   . Gallstone   . History of nephrolithiasis    Bilateral  . Hyperlipidemia   . Hypertension   . Hypertrophy of prostate   . Hypothyroidism   . Impotence of organic origin   . Morbid obesity ([place])   . Nephrolithiasis   . Non-pressure chronic ulcer of left calf, limited to breakdown of skin ([place]) 02/07/2016  . Osteomyelitis ([place])    left tibia and left knee  . Other lymphedema   . Pancreatitis, acute   . Peripheral vascular disease ([place])   . Personal history of arthritis    Osteoarthritis  . Pyogenic arthritis of left knee joint ([place])   . S/P BKA (below knee amputation) unilateral ([place]) 01/22/2015  . Sleep apnea    uses cpap  . Subacute osteomyelitis of left tibia ([place]) 03/05/2017  . Traumatic closed nondisplaced fracture of neck of femur, left, sequela 03/05/2017  . Unspecified septicemia(038.9) ([place])     Family History  Problem Relation Age of Onset  . Diabetes Mother   . Heart attack Mother   . Stroke Mother   . Colon cancer Father   . Dementia Father   . Heart attack Father   . Arthritis Paternal Grandmother   . Hypertension Other   . Hypothyroidism Other     Past Surgical History:  Procedure Laterality Date  . AMPUTATION  02/07/2011   Procedure: AMPUTATION BELOW  KNEE;  Surgeon: Newt Minion, MD;  Location: [place];  Service: Orthopedics;  Laterality: Left;  Left Below Knee Amputation  . AMPUTATION Left 03/09/2017   Procedure: LEFT ABOVE KNEE AMPUTATION;  Surgeon: Newt Minion, MD;  Location: [facility];  Service: Orthopedics;  Laterality: Left;  . bilteral hip     replacement & revison  's   . CYSTOSCOPY W/ RETROGRADES Bilateral 10/24/2012   Procedure: CYSTOSCOPY WITH RETROGRADE PYELOGRAM Procedure: Cystoscopy, Bilateral Retrogarde Pyelograms, Bladder Biopsy, Hydrodistension;  Surgeon: Franchot Gallo, MD;  [facility];  Service: Urology;  Laterality: Bilateral;  . KIDNEY SURGERY    . KNEE SURGERY     left knee    .  LEG AMPUTATION BELOW KNEE    . REPLACEMENT TOTAL KNEE    . TONSILLECTOMY     Social History   Occupational History  . Not on file  Tobacco Use  . Smoking status: Never Smoker  . Smokeless tobacco: Never Used  Substance and Sexual Activity  . Alcohol use: No  . Drug use: No  . Sexual activity: Yes

## 2017-05-18 ENCOUNTER — Telehealth (INDEPENDENT_AMBULATORY_CARE_PROVIDER_SITE_OTHER): Payer: Self-pay | Admitting: Orthopedic Surgery

## 2017-05-18 NOTE — Telephone Encounter (Signed)
Jennifer physical therapist with [facility] is requesting an extension of visits starting the week of 05/28/17. 2x for 2 weeks 1x for 1 week  CB# [phone]

## 2017-05-18 NOTE — Telephone Encounter (Signed)
Called and lm on vm to advise ok to verbal order below.

## 2017-05-21 ENCOUNTER — Telehealth (INDEPENDENT_AMBULATORY_CARE_PROVIDER_SITE_OTHER): Payer: Self-pay | Admitting: Orthopedic Surgery

## 2017-05-21 NOTE — Telephone Encounter (Signed)
Called lm on vm to advise verbal ok to extend orders as listed below.

## 2017-05-21 NOTE — Telephone Encounter (Signed)
[place]? Occupational therapist with [facility] left a voicemail requesting extended OT visits for this patient for  2 x for 2 more weeks.  CB# [phone]

## 2017-06-13 DIAGNOSIS — Z9189 Other specified personal risk factors, not elsewhere classified: Secondary | ICD-10-CM | POA: Insufficient documentation

## 2017-10-17 ENCOUNTER — Ambulatory Visit (INDEPENDENT_AMBULATORY_CARE_PROVIDER_SITE_OTHER): Payer: Medicare Other | Admitting: Family

## 2017-10-17 ENCOUNTER — Ambulatory Visit (INDEPENDENT_AMBULATORY_CARE_PROVIDER_SITE_OTHER): Payer: Self-pay

## 2017-10-17 ENCOUNTER — Encounter (INDEPENDENT_AMBULATORY_CARE_PROVIDER_SITE_OTHER): Payer: Self-pay | Admitting: Family

## 2017-10-17 VITALS — Ht 72.0 in | Wt 263.0 lb

## 2017-10-17 DIAGNOSIS — M545 Low back pain: Secondary | ICD-10-CM

## 2017-10-17 DIAGNOSIS — G8929 Other chronic pain: Secondary | ICD-10-CM | POA: Diagnosis not present

## 2017-10-17 DIAGNOSIS — M25561 Pain in right knee: Secondary | ICD-10-CM | POA: Diagnosis not present

## 2017-10-17 DIAGNOSIS — M25551 Pain in right hip: Secondary | ICD-10-CM | POA: Diagnosis not present

## 2017-10-17 MED ORDER — OXYCODONE-ACETAMINOPHEN 5-325 MG PO TABS: 1.0000 | ORAL_TABLET | Freq: Two times a day (BID) | ORAL | 0 refills | Status: DC | PRN

## 2017-10-17 MED ORDER — PREDNISONE 50 MG PO TABS: ORAL_TABLET | ORAL | 0 refills | Status: DC

## 2017-10-17 NOTE — Progress Notes (Signed)
Office Visit Note   Patient: [name]           Date of Birth: 1955-07-01           MRN: [id number] Visit Date: 10/17/2017              Requested by: No referring provider defined for this encounter. PCP: Patient, No Pcp Per  Chief Complaint  Patient presents with  . Right Leg - Pain    Hx femur fx  . Left Leg - Follow-up    AKA and periprosthetic femur fx       HPI: The patient is a 62 year old gentleman with a history of left below the knee amputation left femoral condyle fracture right femur fracture and chronic low back pain who today is complaining of pain and stiffness to the right knee which is chronic as well as low back and right buttock pain.  Patient is predominantly wheelchair bound his use a [employer] fast streamer for mobility.  Complaining of pain at rest in his wheelchair.  THR 15 years ago.  Assessment & Plan: Visit Diagnoses:  1. Chronic right-sided low back pain, with sciatica presence unspecified   2. Pain in right hip   3. Chronic pain of right knee     Plan: We will trial prednisone course for his radiculopathy.  Encouraged him to work on range of motion of his knee is currently trying to given with patient the [facility] able to do so will be evaluated by physical therapy there.  Follow-Up Instructions: Return in about 4 weeks (around 11/14/2017), or if symptoms worsen or fail to improve.   Right Knee Exam   Tenderness  Right knee tenderness location: global.  Comments:  Rom -15 degrees to 85   Back Exam   Tenderness  The patient is experiencing no tenderness.   Tests  Straight leg raise right: positive      Patient is alert, oriented, no adenopathy, well-dressed, normal affect, normal respiratory effort. Poor exam, unable to get on bed for exam.   Imaging: No results found. No images are attached to the encounter.  Labs: Lab Results  Component Value Date   HGBA1C 5.1 03/09/2017   HGBA1C 5.3 03/20/2016   HGBA1C 5.3 04/29/2015   ESRSEDRATE 67 (H) 12/31/2016   ESRSEDRATE 52 (H) 12/27/2016   ESRSEDRATE 57 (H) 06/13/2010   CRP 4.3 (H) 12/31/2016   CRP 10.4 (H) 12/27/2016   CRP 4.8 (H) 2020-04-309   LABURIC 8.3 (H) 11/25/2015   LABURIC 6.2 10/10/2008   LABURIC 5.6 03/10/2008   REPTSTATUS 01/02/2017 FINAL 12/28/2016   GRAMSTAIN  08/16/2012    NO WBC SEEN MODERATE SQUAMOUS EPITHELIAL CELLS PRESENT MODERATE YEAST   CULT NO GROWTH 5 DAYS 12/28/2016   LABORGA ESCHERICHIA COLI (A) 12/27/2016     Lab Results  Component Value Date   ALBUMIN 2.5 (L) 03/13/2017   ALBUMIN 2.5 (L) 03/12/2017   ALBUMIN 2.7 (L) 03/10/2017   LABURIC 8.3 (H) 11/25/2015   LABURIC 6.2 10/10/2008   LABURIC 5.6 03/10/2008    Body mass index is 35.67 kg/m.  Orders:  Orders Placed This Encounter  Procedures  . XR HIP UNILAT W OR W/O PELVIS 1V RIGHT  . XR Lumbar Spine 2-3 Views   Meds ordered this encounter  Medications  . oxyCODONE-acetaminophen (PERCOCET/ROXICET) 5-325 MG tablet    Sig: Take 1 tablet by mouth 2 (two) times daily as needed for severe pain.    Dispense:  10 tablet  Refill:  0  . predniSONE (DELTASONE) 50 MG tablet    Sig: Take one tablet by mouth once daily for 5 days    Dispense:  5 tablet    Refill:  0     Procedures: No procedures performed  Clinical Data: No additional findings.  ROS:  All other systems negative, except as noted in the HPI. Review of Systems  Constitutional: Negative for chills and fever.  Musculoskeletal: Positive for arthralgias, back pain and gait problem.    Objective: Vital Signs: Ht 6' (1.829 m)   Wt 263 lb (119.3 kg)   BMI 35.67 kg/m   Specialty Comments:  No specialty comments available.  PMFS History: Patient Active Problem List   Diagnosis Date Noted  . Pressure injury of skin 03/12/2017  . S/P AKA (above knee amputation) unilateral, left ([place]) 03/09/2017  . Closed fracture of lower end of right femur with routine healing 03/05/2017  . DDD (degenerative disc  disease), lumbosacral   . Morbid obesity ([place])   . Anemia of chronic disease   . Thrombocytopenia ([place])   . Idiopathic chronic venous hypertension of right lower extremity with inflammation   . Lower back pain 12/27/2016  . UTI (urinary tract infection) 12/27/2016  . Hypotension, unspecified 11/24/2015  . Left knee pain 11/24/2015  . Hypotension 11/24/2015  . Decubitus ulcer of buttock 01/22/2015  . Chronic venous insufficiency 09/08/2014  . Diabetes mellitus, type 2 ([place]) 09/08/2014  . ESRD on dialysis ([place]) 09/08/2014  . Hyperlipidemia 12/23/2013  . Diabetic foot ([place]) 11/20/2013  . Chronic pain 05/21/2013  . Chronic interstitial cystitis 02/06/2013  . UI (urinary incontinence) 10/29/2012  . CKD (chronic kidney disease) stage 3, GFR 30-59 ml/min (HCC) 08/18/2012  . Hyperkalemia 08/04/2012  . Hypothyroidism, acquired 08/04/2012  . OBESITY 09/21/2009  . Essential hypertension 09/21/2009  . DIVERTICULOSIS OF COLON 09/21/2009  . Type 2 diabetes mellitus with diabetic nephropathy, with long-term current use of insulin ([place]) 10/01/2007  . DVT 10/01/2007   Past Medical History:  Diagnosis Date  . Anemia   . Anxiety   . AR (allergic rhinitis)   . Arthritis    osteoarthritis  . Blood transfusion   . BPH (benign prostatic hypertrophy)   . Cellulitis of left leg 08/14/2012  . Cholelithiasis   . Chronic kidney disease    hx of kidney stones  . Chronic pain   . Diabetes mellitus    insulin dependent  . Displacement of lumbar intervertebral disc without myelopathy    L4-L5  . Diverticulosis of colon   . DVT (deep venous thrombosis) (HCC)    Lower Extremity  . Embolism and thrombosis of unspecified artery ([place])   . ESRD (end stage renal disease) ([place])   . Gallstone   . History of nephrolithiasis    Bilateral  . Hyperlipidemia   . Hypertension   . Hypertrophy of prostate   . Hypothyroidism   . Impotence of organic origin   . Morbid obesity ([place])   . Nephrolithiasis   .  Non-pressure chronic ulcer of left calf, limited to breakdown of skin ([place]) 02/07/2016  . Osteomyelitis ([place])    left tibia and left knee  . Other lymphedema   . Pancreatitis, acute   . Peripheral vascular disease ([place])   . Personal history of arthritis    Osteoarthritis  . Pyogenic arthritis of left knee joint ([place])   . S/P BKA (below knee amputation) unilateral ([place]) 01/22/2015  . Sleep apnea    uses cpap  .  Subacute osteomyelitis of left tibia ([place]) 03/05/2017  . Traumatic closed nondisplaced fracture of neck of femur, left, sequela 03/05/2017  . Unspecified septicemia(038.9) ([place])     Family History  Problem Relation Age of Onset  . Diabetes Mother   . Heart attack Mother   . Stroke Mother   . Colon cancer Father   . Dementia Father   . Heart attack Father   . Arthritis Paternal Grandmother   . Hypertension Other   . Hypothyroidism Other     Past Surgical History:  Procedure Laterality Date  . AMPUTATION  02/07/2011   Procedure: AMPUTATION BELOW KNEE;  Surgeon: Newt Minion, MD;  Location: [place];  Service: Orthopedics;  Laterality: Left;  Left Below Knee Amputation  . AMPUTATION Left 03/09/2017   Procedure: LEFT ABOVE KNEE AMPUTATION;  Surgeon: Newt Minion, MD;  Location: [facility];  Service: Orthopedics;  Laterality: Left;  . bilteral hip     replacement & revison  's   . CYSTOSCOPY W/ RETROGRADES Bilateral 10/24/2012   Procedure: CYSTOSCOPY WITH RETROGRADE PYELOGRAM Procedure: Cystoscopy, Bilateral Retrogarde Pyelograms, Bladder Biopsy, Hydrodistension;  Surgeon: Franchot Gallo, MD;  [facility];  Service: Urology;  Laterality: Bilateral;  . KIDNEY SURGERY    . KNEE SURGERY     left knee    . LEG AMPUTATION BELOW KNEE    . REPLACEMENT TOTAL KNEE    . TONSILLECTOMY     Social History   Occupational History  . Not on file  Tobacco Use  . Smoking status: Never Smoker  . Smokeless tobacco: Never Used  Substance and Sexual Activity  . Alcohol use: No  . Drug  use: No  . Sexual activity: Yes

## 2017-11-02 ENCOUNTER — Telehealth (INDEPENDENT_AMBULATORY_CARE_PROVIDER_SITE_OTHER): Payer: Self-pay | Admitting: Orthopedic Surgery

## 2017-11-02 NOTE — Telephone Encounter (Signed)
Patient called in a lot of pain and crying, patient would like to discuss his back pain.

## 2017-11-02 NOTE — Telephone Encounter (Signed)
Patient called Triage phone and left VM. States he is in so much pain. Would like to have something for pain that will actually help. He states last time he was here he had xrays done. Pain is getting worse.   (Towards the end of the voicemail he started crying.)   Please call patient.    CB [phone]

## 2017-11-02 NOTE — Telephone Encounter (Signed)
I called and sw pt. He was not crying at that time but states that they "oxy isn't working" he did say that the pred taper did help slightly.  advised that he has an appt on 9/17 and can discuss treatment options at that time.

## 2017-11-04 IMAGING — CT CT L SPINE W/O CM
4 of 6 series · 12 of 33 positions shown, 14 images · non-contrast
Comparison: None.

CLINICAL DATA: Status post fall August 2016. Severe low back pain for
3 days.

EXAM:
CT LUMBAR SPINE WITHOUT CONTRAST
TECHNIQUE: Multidetector CT imaging of the lumbar spine was performed without
intravenous contrast administration. Multiplanar CT image
reconstructions were also generated.

[Series 4: l spine bone · axial · 0.47mm/px · z∈[+1282,+1466]mm · 5 of 138 slices shown, 7 images]
[im 23/138  soft-tissue]
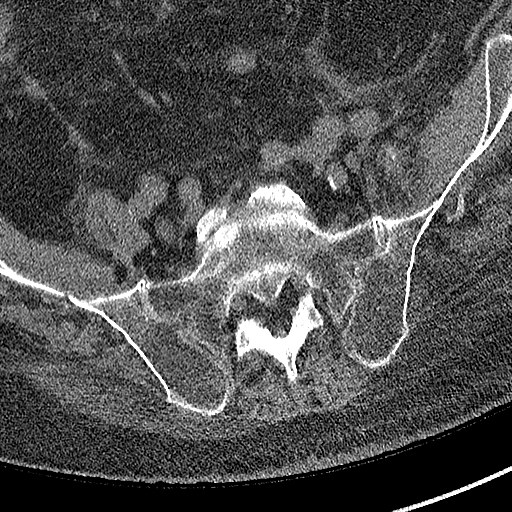
[im 23/138  bone]
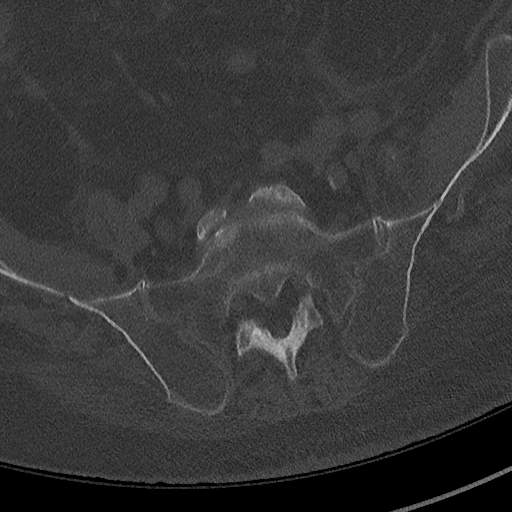
[im 46/138  bone]
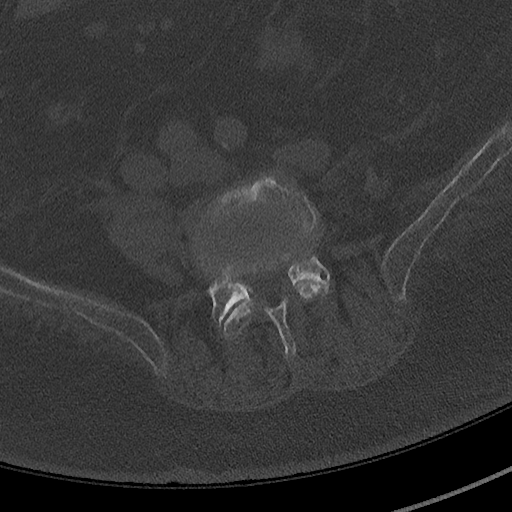
[im 69/138  bone]
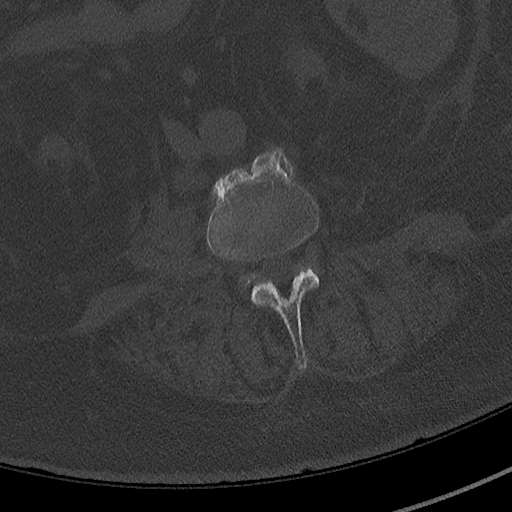
[im 92/138  bone]
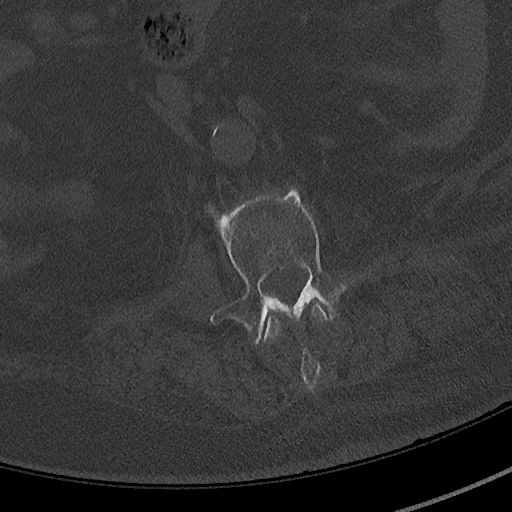
[im 115/138  soft-tissue]
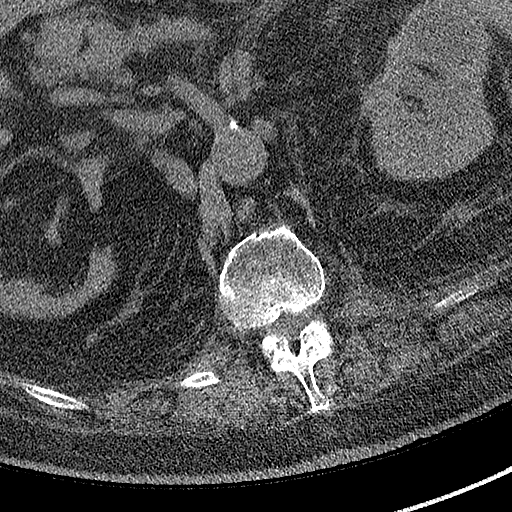
[im 115/138  bone]
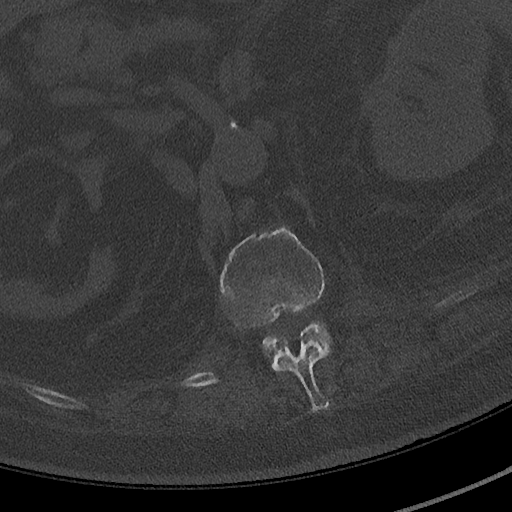

[Series 5: l spine soft · axial · 0.40mm/px · 1 of 154 slices shown]
[im 26/154  soft-tissue]
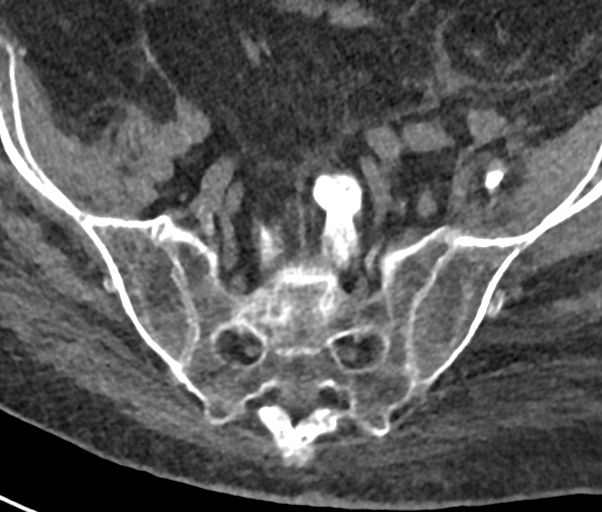

[Series 7: cor bone · coronal · 0.34mm/px · 1 of 94 slices shown]
[im 47/94  bone]
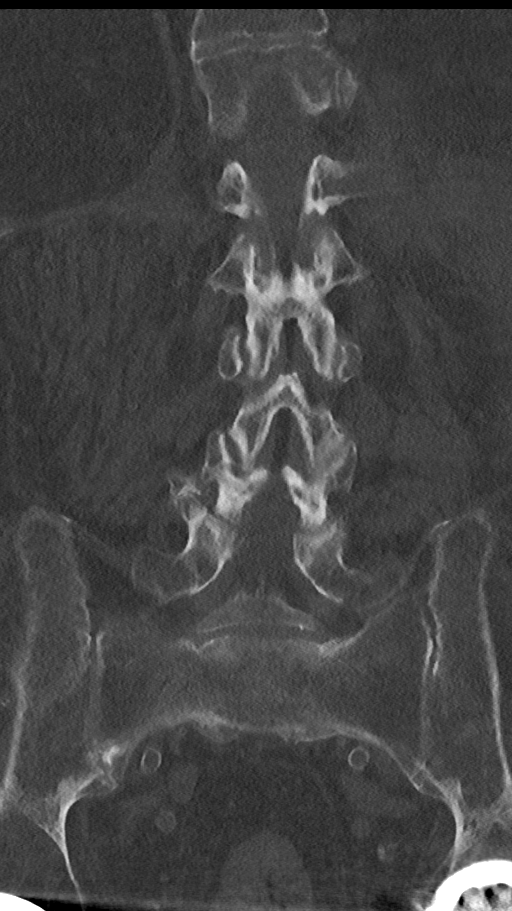

[Series 9: sag st · sagittal · 0.36mm/px · 5 of 93 slices shown]
[im 16/93  bone]
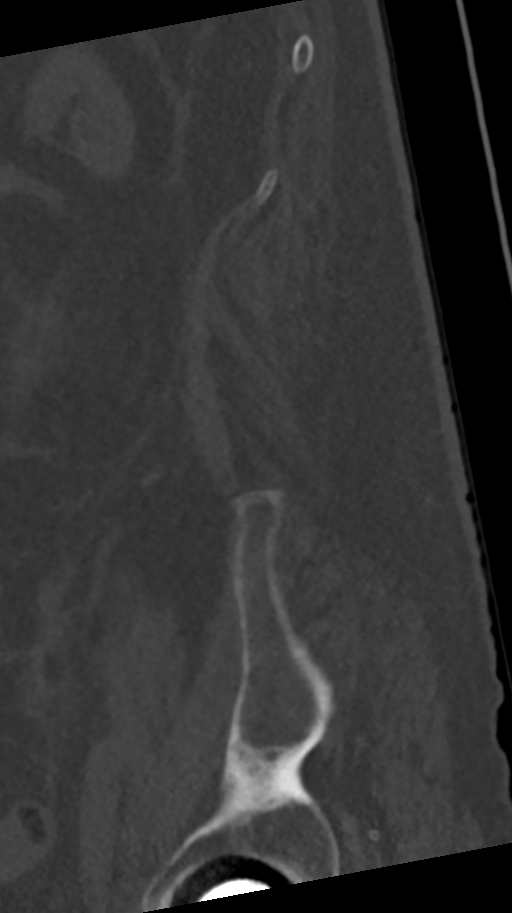
[im 31/93  bone]
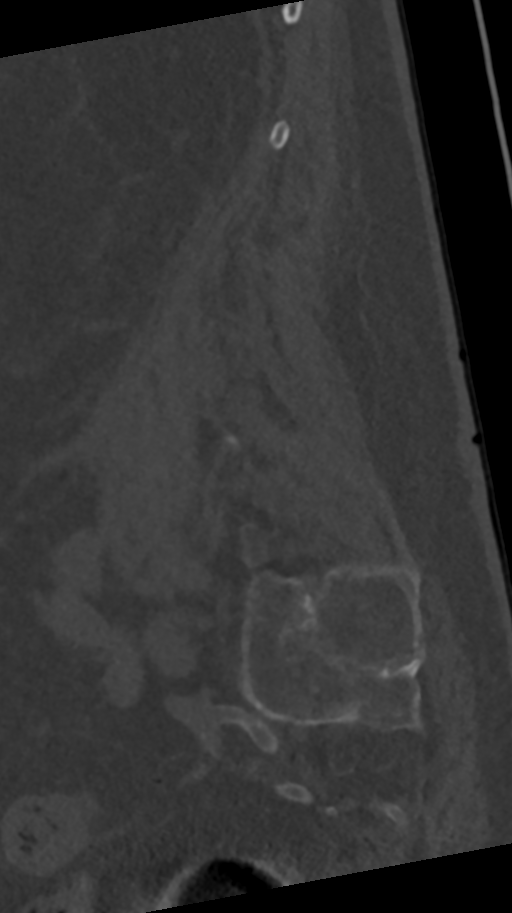
[im 47/93  bone]
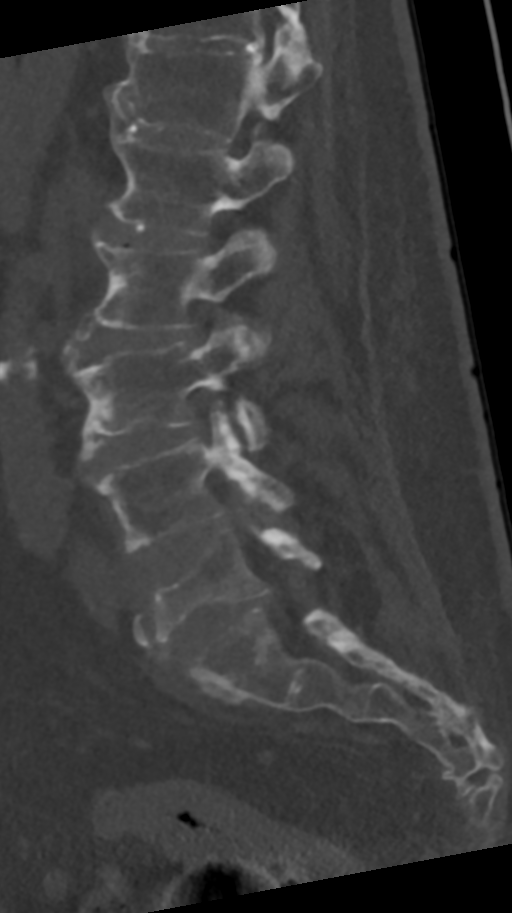
[im 62/93  bone]
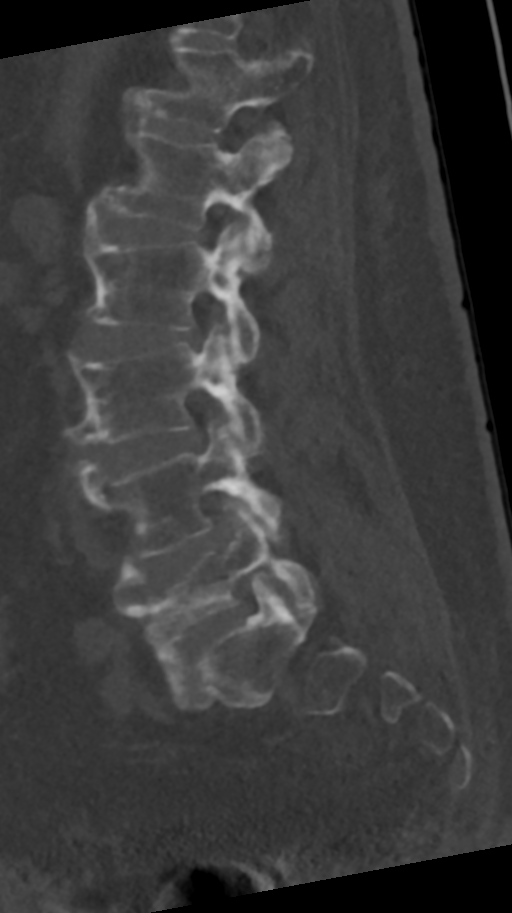
[im 77/93  bone]
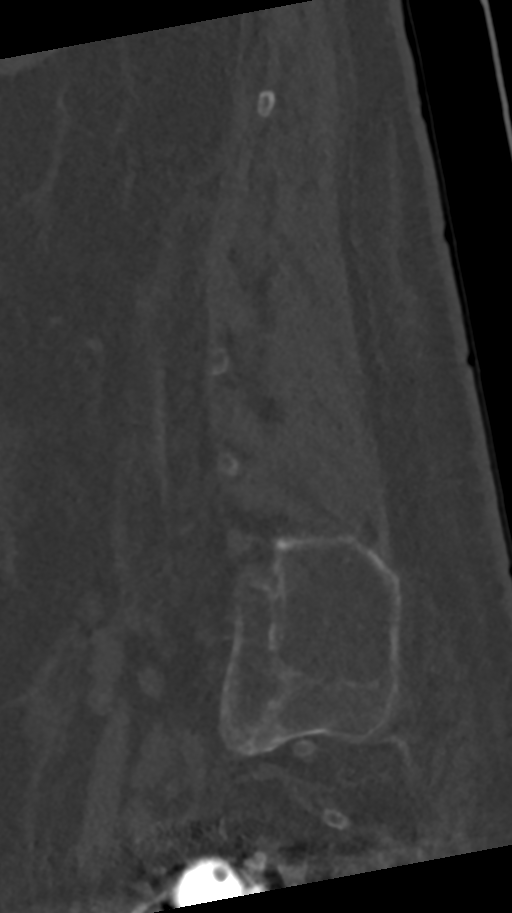

[12 of 33 positions shown; findings below may reference images not displayed]

FINDINGS: Segmentation: 5 lumbar type vertebrae.

Alignment: Normal.

Vertebrae: Severe osteopenia. Severe L5 vertebral body compression
fracture with approximately 80% anterior height loss unchanged
compared with 01/13/2016. Remainder the vertebral body heights are
maintained. No aggressive osseous lesion. Bone destruction.

Paraspinal and other soft tissues: No acute paraspinal abnormality.
Bilateral atrophic kidneys with multiple cysts. Abdominal aortic
atherosclerosis.

Disc levels:

Disc heights are maintained.

At L1-2: Mild broad-based disc bulge. Mild bilateral facet
arthropathy. No foraminal stenosis.

At L2-3: Mild broad-based disc bulge. Mild bilateral facet
arthropathy. No foraminal stenosis.

At L3-4: Mild broad-based disc bulge. Mild bilateral facet
arthropathy. Mild right foraminal stenosis. No left foraminal
stenosis.

At L4-5: Mild broad-based disc bulge. Moderate bilateral facet
arthropathy, right worse than left. Mild right foraminal stenosis.
No left foraminal stenosis. Central canal stenosis.

At L5-S1: Broad-based disc osteophyte complex. Mild bilateral facet
arthropathy. Bilateral foraminal stenosis.
IMPRESSION: 1.  No acute osseous injury of the lumbar spine.
2. Chronic L5 vertebral body compression fracture.
3. Lumbar spine spondylosis as described above.

## 2017-11-14 ENCOUNTER — Ambulatory Visit (INDEPENDENT_AMBULATORY_CARE_PROVIDER_SITE_OTHER): Payer: Medicare Other | Admitting: Orthopedic Surgery

## 2017-11-14 ENCOUNTER — Ambulatory Visit (INDEPENDENT_AMBULATORY_CARE_PROVIDER_SITE_OTHER): Payer: Medicare Other | Admitting: Family

## 2017-11-14 ENCOUNTER — Encounter (INDEPENDENT_AMBULATORY_CARE_PROVIDER_SITE_OTHER): Payer: Self-pay | Admitting: Family

## 2017-11-14 DIAGNOSIS — M5137 Other intervertebral disc degeneration, lumbosacral region: Secondary | ICD-10-CM

## 2017-11-14 DIAGNOSIS — M545 Low back pain, unspecified: Secondary | ICD-10-CM

## 2017-11-14 DIAGNOSIS — G8929 Other chronic pain: Secondary | ICD-10-CM | POA: Diagnosis not present

## 2017-11-14 MED ORDER — HYDROCODONE-ACETAMINOPHEN 5-325 MG PO TABS: 1.0000 | ORAL_TABLET | Freq: Two times a day (BID) | ORAL | 0 refills | Status: DC | PRN

## 2017-11-14 NOTE — Progress Notes (Signed)
Office Visit Note   Patient: [name]           Date of Birth: [birth year]           MRN: [id number] Visit Date: 11/14/2017              Requested by: No referring provider defined for this encounter. PCP: Patient, No Pcp Per  No chief complaint on file.     HPI: Patient is a 62 year old gentleman who presents today in follow-up for low back pain mainly right-sided.  This is a chronic problem problem.  No recent falls or injuries.  He is wheelchair-bound with multiple medical problems.  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  Complaining of some right hip pain however he is pointing to his buttocks on the right as well as his right side of his low back.  Denies any groin pain.  Denies radicular symptoms.  Assessment & Plan: Visit Diagnoses:  1. DDD (degenerative disc disease), lumbosacral   2. Chronic right-sided low back pain without sciatica     Plan: We will provide a one-time refill of his hydrocodone.  Encouraged him to progress with physical therapy.  Encouraged him to get in with [facility].  He will follow-up in the office as needed.  Follow-Up Instructions: Return if symptoms worsen or fail to improve.   Right Hip Exam   Tenderness  The patient is experiencing no tenderness.   Comments:  Poor exam however painless range of motion right hip   Back Exam   Tenderness  The patient is experiencing no tenderness.   Range of Motion  The patient has normal back ROM.  Tests  Straight leg raise right: negative Straight leg raise left: negative      Patient is alert, oriented, no adenopathy, well-dressed, normal affect, normal respiratory effort.   Imaging: No results found. No images are attached to the encounter.  Labs: Lab Results  Component Value Date   HGBA1C 5.1 03/09/2017   HGBA1C 5.3 03/20/2016   HGBA1C 5.3 04/29/2015   ESRSEDRATE 67 (H) 12/31/2016   ESRSEDRATE 52 (H) 12/27/2016   ESRSEDRATE 57 (H) 06/13/2010   CRP 4.3 (H)  12/31/2016   CRP 10.4 (H) 12/27/2016   CRP 4.8 (H) 2020-07-809   LABURIC 8.3 (H) 11/25/2015   LABURIC 6.2 10/10/2008   LABURIC 5.6 03/10/2008   REPTSTATUS 01/02/2017 FINAL 12/28/2016   GRAMSTAIN  08/16/2012    NO WBC SEEN MODERATE SQUAMOUS EPITHELIAL CELLS PRESENT MODERATE YEAST   CULT NO GROWTH 5 DAYS 12/28/2016   LABORGA ESCHERICHIA COLI (A) 12/27/2016     Lab Results  Component Value Date   ALBUMIN 2.5 (L) 03/13/2017   ALBUMIN 2.5 (L) 03/12/2017   ALBUMIN 2.7 (L) 03/10/2017   LABURIC 8.3 (H) 11/25/2015   LABURIC 6.2 10/10/2008   LABURIC 5.6 03/10/2008    There is no height or weight on file to calculate BMI.  Orders:  No orders of the defined types were placed in this encounter.  Meds ordered this encounter  Medications  . HYDROcodone-acetaminophen (NORCO/VICODIN) 5-325 MG tablet    Sig: Take 1 tablet by mouth 2 (two) times daily as needed (pain).    Dispense:  28 tablet    Refill:  0     Procedures: No procedures performed  Clinical Data: No additional findings.  ROS:  All other systems negative, except as noted in the HPI. Review of Systems  Constitutional: Negative for chills and fever.  Musculoskeletal: Positive for arthralgias, back pain and myalgias.    Objective: Vital Signs: There were no vitals taken for this visit.  Specialty Comments:  No specialty comments available.  PMFS History: Patient Active Problem List   Diagnosis Date Noted  . Pressure injury of skin 03/12/2017  . S/P AKA (above knee amputation) unilateral, left ([place]) 03/09/2017  . Closed fracture of lower end of right femur with routine healing 03/05/2017  . DDD (degenerative disc disease), lumbosacral   . Morbid obesity ([place])   . Anemia of chronic disease   . Thrombocytopenia ([place])   . Idiopathic chronic venous hypertension of right lower extremity with inflammation   . Lower back pain 12/27/2016  . UTI (urinary tract infection) 12/27/2016  . Hypotension, unspecified  11/24/2015  . Left knee pain 11/24/2015  . Hypotension 11/24/2015  . Decubitus ulcer of buttock 01/22/2015  . Chronic venous insufficiency 09/08/2014  . Diabetes mellitus, type 2 ([place]) 09/08/2014  . ESRD on dialysis ([place]) 09/08/2014  . Hyperlipidemia 12/23/2013  . Diabetic foot ([place]) 11/20/2013  . Chronic pain 05/21/2013  . Chronic interstitial cystitis 02/06/2013  . UI (urinary incontinence) 10/29/2012  . CKD (chronic kidney disease) stage 3, GFR 30-59 ml/min (HCC) 08/18/2012  . Hyperkalemia 08/04/2012  . Hypothyroidism, acquired 08/04/2012  . OBESITY 09/21/2009  . Essential hypertension 09/21/2009  . DIVERTICULOSIS OF COLON 09/21/2009  . Type 2 diabetes mellitus with diabetic nephropathy, with long-term current use of insulin ([place]) 10/01/2007  . DVT 10/01/2007   Past Medical History:  Diagnosis Date  . Anemia   . Anxiety   . AR (allergic rhinitis)   . Arthritis    osteoarthritis  . Blood transfusion   . BPH (benign prostatic hypertrophy)   . Cellulitis of left leg 08/14/2012  . Cholelithiasis   . Chronic kidney disease    hx of kidney stones  . Chronic pain   . Diabetes mellitus    insulin dependent  . Displacement of lumbar intervertebral disc without myelopathy    L4-L5  . Diverticulosis of colon   . DVT (deep venous thrombosis) (HCC)    Lower Extremity  . Embolism and thrombosis of unspecified artery ([place])   . ESRD (end stage renal disease) ([place])   . Gallstone   . History of nephrolithiasis    Bilateral  . Hyperlipidemia   . Hypertension   . Hypertrophy of prostate   . Hypothyroidism   . Impotence of organic origin   . Morbid obesity ([place])   . Nephrolithiasis   . Non-pressure chronic ulcer of left calf, limited to breakdown of skin ([place]) 02/07/2016  . Osteomyelitis ([place])    left tibia and left knee  . Other lymphedema   . Pancreatitis, acute   . Peripheral vascular disease ([place])   . Personal history of arthritis    Osteoarthritis  . Pyogenic arthritis  of left knee joint ([place])   . S/P BKA (below knee amputation) unilateral ([place]) 01/22/2015  . Sleep apnea    uses cpap  . Subacute osteomyelitis of left tibia ([place]) 03/05/2017  . Traumatic closed nondisplaced fracture of neck of femur, left, sequela 03/05/2017  . Unspecified septicemia(038.9) ([place])     Family History  Problem Relation Age of Onset  . Diabetes Mother   . Heart attack Mother   . Stroke Mother   . Colon cancer Father   . Dementia Father   . Heart attack Father   . Arthritis Paternal Grandmother   . Hypertension Other   .  Hypothyroidism Other     Past Surgical History:  Procedure Laterality Date  . AMPUTATION  02/07/2011   Procedure: AMPUTATION BELOW KNEE;  Surgeon: Newt Minion, MD;  Location: [facility];  Service: Orthopedics;  Laterality: Left;  Left Below Knee Amputation  . AMPUTATION Left 03/09/2017   Procedure: LEFT ABOVE KNEE AMPUTATION;  Surgeon: Newt Minion, MD;  Location: [facility];  Service: Orthopedics;  Laterality: Left;  . bilteral hip     replacement & revison  's   . CYSTOSCOPY W/ RETROGRADES Bilateral 10/24/2012   Procedure: CYSTOSCOPY WITH RETROGRADE PYELOGRAM Procedure: Cystoscopy, Bilateral Retrogarde Pyelograms, Bladder Biopsy, Hydrodistension;  Surgeon: Franchot Gallo, MD;  Location: [facility];  Service: Urology;  Laterality: Bilateral;  . KIDNEY SURGERY    . KNEE SURGERY     left knee    . LEG AMPUTATION BELOW KNEE    . REPLACEMENT TOTAL KNEE    . TONSILLECTOMY     Social History   Occupational History  . Not on file  Tobacco Use  . Smoking status: Never Smoker  . Smokeless tobacco: Never Used  Substance and Sexual Activity  . Alcohol use: No  . Drug use: No  . Sexual activity: Yes

## 2017-11-21 ENCOUNTER — Emergency Department (HOSPITAL_COMMUNITY): Payer: Medicare Other

## 2017-11-21 ENCOUNTER — Encounter (HOSPITAL_COMMUNITY): Payer: Self-pay

## 2017-11-21 ENCOUNTER — Emergency Department (HOSPITAL_COMMUNITY)
Admission: EM | Admit: 2017-11-21 | Discharge: 2017-11-21 | Disposition: A | Discharge status: Home / Self Care | Payer: Medicare Other | Attending: Emergency Medicine | Admitting: Emergency Medicine

## 2017-11-21 DIAGNOSIS — E039 Hypothyroidism, unspecified: Secondary | ICD-10-CM | POA: Insufficient documentation

## 2017-11-21 DIAGNOSIS — I129 Hypertensive chronic kidney disease with stage 1 through stage 4 chronic kidney disease, or unspecified chronic kidney disease: Secondary | ICD-10-CM | POA: Diagnosis not present

## 2017-11-21 DIAGNOSIS — E875 Hyperkalemia: Secondary | ICD-10-CM | POA: Diagnosis not present

## 2017-11-21 DIAGNOSIS — Z794 Long term (current) use of insulin: Secondary | ICD-10-CM | POA: Insufficient documentation

## 2017-11-21 DIAGNOSIS — M25551 Pain in right hip: Secondary | ICD-10-CM | POA: Insufficient documentation

## 2017-11-21 DIAGNOSIS — E119 Type 2 diabetes mellitus without complications: Secondary | ICD-10-CM | POA: Diagnosis not present

## 2017-11-21 DIAGNOSIS — N183 Chronic kidney disease, stage 3 (moderate): Secondary | ICD-10-CM | POA: Diagnosis not present

## 2017-11-21 DIAGNOSIS — M79604 Pain in right leg: Secondary | ICD-10-CM

## 2017-11-21 LAB — I-STAT CHEM 8, ED
BUN: 110 mg/dL — ABNORMAL HIGH (ref 6–20)
CALCIUM ION: 0.72 mmol/L — AB (ref 1.15–1.40)
Chloride: 101 mmol/L (ref 98–111)
Creatinine, Ser: 10.4 mg/dL — ABNORMAL HIGH (ref 0.61–1.24)
Glucose, Bld: 91 mg/dL (ref 70–99)
HEMATOCRIT: 24 % — AB (ref 39.0–52.0)
HEMOGLOBIN: 8.2 g/dL — AB (ref 13.0–17.0)
Potassium: 6.1 mmol/L — ABNORMAL HIGH (ref 3.5–5.1)
SODIUM: 132 mmol/L — AB (ref 135–145)
TCO2: 21 mmol/L — ABNORMAL LOW (ref 22–32)

## 2017-11-21 LAB — CBC
HCT: 26.7 % — ABNORMAL LOW (ref 39.0–52.0)
Hemoglobin: 7.8 g/dL — ABNORMAL LOW (ref 13.0–17.0)
MCH: 28.8 pg (ref 26.0–34.0)
MCHC: 29.2 g/dL — ABNORMAL LOW (ref 30.0–36.0)
MCV: 98.5 fL (ref 78.0–100.0)
PLATELETS: 172 10*3/uL (ref 150–400)
RBC: 2.71 MIL/uL — AB (ref 4.22–5.81)
RDW: 15.3 % (ref 11.5–15.5)
WBC: 10.8 10*3/uL — AB (ref 4.0–10.5)

## 2017-11-21 LAB — POTASSIUM: POTASSIUM: 3.3 mmol/L — AB (ref 3.5–5.1)

## 2017-11-21 MED ORDER — CHLORHEXIDINE GLUCONATE CLOTH 2 % EX PADS: 6.0000 | MEDICATED_PAD | Freq: Every day | CUTANEOUS | Status: DC

## 2017-11-21 MED ORDER — SODIUM POLYSTYRENE SULFONATE 15 GM/60ML PO SUSP
30.0000 g | Freq: Once | ORAL | Status: AC
  Administered 2017-11-21: 30 g via ORAL
  Filled 2017-11-21: qty 120

## 2017-11-21 MED ORDER — HEPARIN SODIUM (PORCINE) 1000 UNIT/ML DIALYSIS
1000.0000 [IU] | INTRAMUSCULAR | Status: DC | PRN
  Filled 2017-11-21: qty 1

## 2017-11-21 MED ORDER — LIDOCAINE HCL (PF) 1 % IJ SOLN: 5.0000 mL | INTRAMUSCULAR | Status: DC | PRN

## 2017-11-21 MED ORDER — SODIUM CHLORIDE 0.9 % IV SOLN: 100.0000 mL | INTRAVENOUS | Status: DC | PRN

## 2017-11-21 MED ORDER — LIDOCAINE-PRILOCAINE 2.5-2.5 % EX CREA
1.0000 "application " | TOPICAL_CREAM | CUTANEOUS | Status: DC | PRN
  Filled 2017-11-21: qty 5

## 2017-11-21 MED ORDER — PENTAFLUOROPROP-TETRAFLUOROETH EX AERO
1.0000 "application " | INHALATION_SPRAY | CUTANEOUS | Status: DC | PRN
  Filled 2017-11-21: qty 103.5

## 2017-11-21 MED ORDER — MIDODRINE HCL 5 MG PO TABS
10.0000 mg | ORAL_TABLET | Freq: Three times a day (TID) | ORAL | Status: DC
  Administered 2017-11-21: 10 mg via ORAL
  Filled 2017-11-21 (×2): qty 2

## 2017-11-21 MED ORDER — MIDODRINE HCL 5 MG PO TABS
ORAL_TABLET | ORAL | Status: AC
  Filled 2017-11-21: qty 2

## 2017-11-21 MED ORDER — ACETAMINOPHEN 500 MG PO TABS: 500.0000 mg | ORAL_TABLET | Freq: Four times a day (QID) | ORAL | Status: DC | PRN

## 2017-11-21 MED ORDER — HYDROCODONE-ACETAMINOPHEN 5-325 MG PO TABS
1.0000 | ORAL_TABLET | Freq: Two times a day (BID) | ORAL | Status: DC | PRN
  Administered 2017-11-21: 1 via ORAL
  Filled 2017-11-21: qty 1

## 2017-11-21 MED ORDER — ALTEPLASE 2 MG IJ SOLR: 2.0000 mg | Freq: Once | INTRAMUSCULAR | Status: DC | PRN

## 2017-11-21 MED ORDER — HYDROMORPHONE HCL 1 MG/ML IJ SOLN
1.0000 mg | Freq: Once | INTRAMUSCULAR | Status: AC
  Administered 2017-11-21: 1 mg via INTRAMUSCULAR
  Filled 2017-11-21: qty 1

## 2017-11-21 NOTE — ED Notes (Signed)
Patient transported to X-ray 

## 2017-11-21 NOTE — ED Triage Notes (Signed)
Pt form home via ems; pt fell last Saturday while transferring into car, slid off slide board into floorboard of vehicle; ems helped him up, declined to come to hospital; had increased pain that night; hx chronic back pain, ESRD, femur fracture, diabetes, sciatica  Dialysis pt, When to dialysis T, Th, Sat, but missed Tuesday this week due to not being able to get out of bed  neurologically intact, able to R move leg; AKA L  Takes oxycodone and tramadol at home for chronic leg and back pain, took today  120/70 76 P 96% RA CBG 126 RR 18

## 2017-11-21 NOTE — Discharge Planning (Signed)
[facility] consulted regarding home health services.  Pt in process of enrolling in [facility] program.  [facility] placed call to [facility] to inquire pt status.  Will update team as information becomes available.

## 2017-11-21 NOTE — ED Notes (Signed)
Pt coming back down from dialysis for d/c home, pt will need to go home via [facility]tar.

## 2017-11-21 NOTE — ED Provider Notes (Signed)
Emergency Department Provider Note   I have reviewed the triage vital signs and the nursing notes.   HISTORY  Chief Complaint Fall; Leg Pain; and Back Pain   HPI [name] is a 62 y.o. male with multiple medical problems as documented below the presents to the emergency department today secondary to leg pain.  Patient states that he had a fall on Saturday while going to dialysis and since then he has had some right posterior hip and right leg pain.  He has a left amputation.  At baseline he gets around with a wheelchair and has abnormal orientation of his right leg with discoloration as well.  This is not changed.  He tried Vicodin today prior to coming in which did not help.   I reviewed the patient's narcotic database history in PMP aware for [place] and found that they had received 13 Rx (combined) for vicodin, tramadol, percocet, ambien from 5 different providers in 2 states just in the last 6 months.   No other associated or modifying symptoms.    Past Medical History:  Diagnosis Date  . Anemia   . Anxiety   . AR (allergic rhinitis)   . Arthritis    osteoarthritis  . Blood transfusion   . BPH (benign prostatic hypertrophy)   . Cellulitis of left leg 08/14/2012  . Cholelithiasis   . Chronic kidney disease    hx of kidney stones  . Chronic pain   . Diabetes mellitus    insulin dependent  . Displacement of lumbar intervertebral disc without myelopathy    L4-L5  . Diverticulosis of colon   . DVT (deep venous thrombosis) (HCC)    Lower Extremity  . Embolism and thrombosis of unspecified artery ([place])   . ESRD (end stage renal disease) ([place])   . Gallstone   . History of nephrolithiasis    Bilateral  . Hyperlipidemia   . Hypertension   . Hypertrophy of prostate   . Hypothyroidism   . Impotence of organic origin   . Morbid obesity ([place])   . Nephrolithiasis   . Non-pressure chronic ulcer of left calf, limited to breakdown of skin ([place]) 02/07/2016  .  Osteomyelitis ([place])    left tibia and left knee  . Other lymphedema   . Pancreatitis, acute   . Peripheral vascular disease ([place])   . Personal history of arthritis    Osteoarthritis  . Pyogenic arthritis of left knee joint ([place])   . S/P BKA (below knee amputation) unilateral ([place]) 01/22/2015  . Sleep apnea    uses cpap  . Subacute osteomyelitis of left tibia ([place]) 03/05/2017  . Traumatic closed nondisplaced fracture of neck of femur, left, sequela 03/05/2017  . Unspecified septicemia(038.9) [facility])     Patient Active Problem List   Diagnosis Date Noted  . Pressure injury of skin 03/12/2017  . S/P AKA (above knee amputation) unilateral, left ([place]) 03/09/2017  . Closed fracture of lower end of right femur with routine healing 03/05/2017  . DDD (degenerative disc disease), lumbosacral   . Morbid obesity ([place])   . Anemia of chronic disease   . Thrombocytopenia ([place])   . Idiopathic chronic venous hypertension of right lower extremity with inflammation   . Lower back pain 12/27/2016  . UTI (urinary tract infection) 12/27/2016  . Hypotension, unspecified 11/24/2015  . Left knee pain 11/24/2015  . Hypotension 11/24/2015  . Decubitus ulcer of buttock 01/22/2015  . Chronic venous insufficiency 09/08/2014  . Diabetes mellitus, type 2 (  [place]) 09/08/2014  . ESRD on dialysis ([place]) 09/08/2014  . Hyperlipidemia 12/23/2013  . Diabetic foot ([place]) 11/20/2013  . Chronic pain 05/21/2013  . Chronic interstitial cystitis 02/06/2013  . UI (urinary incontinence) 10/29/2012  . CKD (chronic kidney disease) stage 3, GFR 30-59 ml/min (HCC) 08/18/2012  . Hyperkalemia 08/04/2012  . Hypothyroidism, acquired 08/04/2012  . OBESITY 09/21/2009  . Essential hypertension 09/21/2009  . DIVERTICULOSIS OF COLON 09/21/2009  . Type 2 diabetes mellitus with diabetic nephropathy, with long-term current use of insulin ([place]) 10/01/2007  . DVT 10/01/2007    Past Surgical History:  Procedure Laterality Date  . AMPUTATION   02/07/2011   Procedure: AMPUTATION BELOW KNEE;  Surgeon: Newt Minion, MD;  Location: [facility];  Service: Orthopedics;  Laterality: Left;  Left Below Knee Amputation  . AMPUTATION Left 03/09/2017   Procedure: LEFT ABOVE KNEE AMPUTATION;  Surgeon: Newt Minion, MD;  Location: [facility];  Service: Orthopedics;  Laterality: Left;  . bilteral hip     replacement & revison  's   . CYSTOSCOPY W/ RETROGRADES Bilateral 10/24/2012   Procedure: CYSTOSCOPY WITH RETROGRADE PYELOGRAM Procedure: Cystoscopy, Bilateral Retrogarde Pyelograms, Bladder Biopsy, Hydrodistension;  Surgeon: Franchot Gallo, MD;  [facility];  Service: Urology;  Laterality: Bilateral;  . KIDNEY SURGERY    . KNEE SURGERY     left knee    . LEG AMPUTATION BELOW KNEE    . REPLACEMENT TOTAL KNEE    . TONSILLECTOMY      Current Outpatient Rx  . Order #: [id number] Class: Historical Med  . Order #: [id number] Class: Normal  . Order #: [id number] Class: Normal  . Order #: [id number] Class: Print  . Order #: [id number] Class: Historical Med  . Order #: [id number] Class: Historical Med  . Order #: [id number] Class: Normal  . Order #: [id number] Class: Historical Med  . Order #: [id number] Class: Historical Med  . Order #: [id number] Class: Historical Med  . Order #: [id number] Class: Historical Med  . Order #: [id number] Class: Normal  . Order #: [id number] Class: Print    Allergies Patient has no known allergies.  Family History  Problem Relation Age of Onset  . Diabetes Mother   . Heart attack Mother   . Stroke Mother   . Colon cancer Father   . Dementia Father   . Heart attack Father   . Arthritis Paternal Grandmother   . Hypertension Other   . Hypothyroidism Other     Social History Social History   Tobacco Use  . Smoking status: Never Smoker  . Smokeless tobacco: Never Used  Substance Use Topics  . Alcohol use: No  . Drug use: No    Review of Systems  All other systems negative except as documented in the HPI. All pertinent  positives and negatives as reviewed in the HPI. ____________________________________________   PHYSICAL EXAM:  VITAL SIGNS: Blood pressure 113/69, pulse 78, temperature 97.6 F (36.4 C), temperature source Oral, resp. rate 16, height 6' (1.829 m), weight 117.9 kg, SpO2 95 %.\  Constitutional: Alert and oriented. Chronically ill-appearing and in no acute distress. Eyes: Conjunctivae are normal. PERRL. EOMI. Head: Atraumatic. Nose: No congestion/rhinnorhea. Mouth/Throat: Mucous membranes are moist.  Oropharynx non-erythematous. Neck: No stridor.  No meningeal signs.   Cardiovascular: Normal rate, regular rhythm. Good peripheral circulation. Grossly normal heart sounds.   Respiratory: Normal respiratory effort.  No retractions. Lungs CTAB. Gastrointestinal: Soft and nontender. No distention.  Musculoskeletal: LAKA, right leg externally rotated, dark and discolored. Normal pulse. ttp at distal femur but when distracted doesn't  wince when palpating or ROM. Neurologic:  Normal speech and language. No gross focal neurologic deficits are appreciated.  Skin:  Skin is warm, dry and intact. No rash noted.   ____________________________________________   LABS (all labs ordered are listed, but only abnormal results are displayed)  Labs Reviewed  I-STAT CHEM 8, ED - Abnormal; Notable for the following components:      Result Value   Sodium 132 (*)    Potassium 6.1 (*)    BUN 110 (*)    Creatinine, Ser 10.40 (*)    Calcium, Ion 0.72 (*)    TCO2 21 (*)    Hemoglobin 8.2 (*)    HCT 24.0 (*)    All other components within normal limits   ____________________________________________  EKG   EKG Interpretation  Date/Time:  Wednesday November 21 2017 09:39:13 EDT Ventricular Rate:  74 PR Interval:    QRS Duration: 89 QT Interval:  433 QTC Calculation: 481 R Axis:   12 Text Interpretation:  Sinus rhythm Prolonged PR interval Low voltage, extremity and precordial leads Consider  anterior infarct No significant change since last tracing in 2017 Confirmed by Merrily Pew [phone]) on 11/21/2017 11:33:43 AM       ____________________________________________  RADIOLOGY  Dg Pelvis 1-2 Views  Result Date: 11/21/2017 CLINICAL DATA:  Diffuse pelvic pain following a fall several days ago. EXAM: PELVIS - 1-2 VIEW COMPARISON:  01/24/2017. FINDINGS: Again demonstrated are bilateral hip prostheses with protrusio acetabuli on the left and plate and screw fixation of the right femoral shaft. The bones are difficult to visualize due to severe osteopenia. No gross fracture or dislocation seen. Diffuse arterial calcifications are noted. IMPRESSION: 1. Marked osteopenia with no gross fracture or dislocation. 2. Hardware intact. Electronically Signed   By: Claudie Revering M.D.   On: 11/21/2017 11:29   Dg Femur Min 2 Views Right  Result Date: 11/21/2017 CLINICAL DATA:  Fall.  Leg pain EXAM: RIGHT FEMUR 2 VIEWS COMPARISON:  Radiograph 02/04/2017 FINDINGS: There is internal plate fixation distal RIGHT femoral fracture. A chronic fracture at the RIGHT distal metaphysis with angulation and impaction not changed from comparison exam. Diffuse osteopenia of the femur. Proximal RIGHT hip arthroplasty. No evidence of fracture dislocation. IMPRESSION: 1. No evidence acute fracture or dislocation. 2. Chronic fracture of the distal RIGHT femoral metaphysis with internal dynamic plate fixation. 3. RIGHT hip arthroplasty without evidence of fracture dislocation. 4. Osteopenia. Electronically Signed   By: Suzy Bouchard M.D.   On: 11/21/2017 11:29    ____________________________________________   PROCEDURES  Procedure(s) performed:   Procedures   ____________________________________________   INITIAL IMPRESSION / ASSESSMENT AND PLAN / ED COURSE  Patient with leg pain missed dialysis yesterday found to have potassium 6.1.  No EKG changes.  I discussed with his nephrology center and they would not  really do dialysis in a timely manner today.  I discussed with our nephrologist, Dr. Carolin Sicks he will set up for dialysis afternoon and discharge afterwards.  Patient's x-ray negative for any acute changes in his leg.  We will follow-up with orthopedics for the same.  Will engage care management to see if any services available at home to make it safer. .  Pertinent labs & imaging results that were available during my care of the patient were reviewed by me and considered in my medical decision making (see chart for details).  ____________________________________________  FINAL CLINICAL IMPRESSION(S) / ED DIAGNOSES  Final diagnoses:  Pain of right lower extremity  Hyperkalemia  MEDICATIONS GIVEN DURING THIS VISIT:  Medications  sodium polystyrene (KAYEXALATE) 15 GM/60ML suspension 30 g (has no administration in time range)  HYDROmorphone (DILAUDID) injection 1 mg (1 mg Intramuscular Given 11/21/17 1033)     NEW OUTPATIENT MEDICATIONS STARTED DURING THIS VISIT:  New Prescriptions   No medications on file    Note:  This note was prepared with assistance of Dragon voice recognition software. Occasional wrong-word or sound-a-like substitutions may have occurred due to the inherent limitations of voice recognition software.   Merrily Pew, MD 11/21/17 1520

## 2017-11-21 NOTE — ED Notes (Signed)
Pt's R foot externally rotated; discoloration of lower leg; pt states this is normal for him; pedal pulse palpated

## 2017-11-21 NOTE — Discharge Planning (Signed)
Pt currently active with [facility] for RN services.  Resumption of care requested. Tiffany of [facility]  notified.  No DME needs identified at this time.

## 2017-11-21 NOTE — ED Notes (Signed)
[facility] called for transport.  

## 2017-12-25 ENCOUNTER — Telehealth (INDEPENDENT_AMBULATORY_CARE_PROVIDER_SITE_OTHER): Payer: Self-pay | Admitting: Orthopedic Surgery

## 2017-12-25 ENCOUNTER — Other Ambulatory Visit (INDEPENDENT_AMBULATORY_CARE_PROVIDER_SITE_OTHER): Payer: Self-pay | Admitting: Orthopedic Surgery

## 2017-12-25 MED ORDER — HYDROCODONE-ACETAMINOPHEN 5-325 MG PO TABS: 1.0000 | ORAL_TABLET | Freq: Two times a day (BID) | ORAL | 0 refills | Status: DC | PRN

## 2017-12-25 NOTE — Telephone Encounter (Signed)
Pt is requesting a refill on hi Vicodin. lst refill was 11/14/17 #28

## 2017-12-25 NOTE — Telephone Encounter (Signed)
Patient left a voicemail saying that his pharmacy, [facility] [address] has sent over multiple rx refill requests for hydrocodone. He wants to know if these have been received. Patients # [phone]

## 2017-12-25 NOTE — Telephone Encounter (Signed)
rx written

## 2017-12-26 NOTE — Telephone Encounter (Signed)
I printed out and signed, will rewrite tomorrow

## 2017-12-26 NOTE — Telephone Encounter (Signed)
Did you fax this rx to the pharm or print it out?

## 2017-12-27 NOTE — Telephone Encounter (Signed)
Can you re write. You did not put it on my desk

## 2018-02-13 DIAGNOSIS — Z8719 Personal history of other diseases of the digestive system: Secondary | ICD-10-CM | POA: Insufficient documentation

## 2018-03-25 ENCOUNTER — Encounter (HOSPITAL_COMMUNITY): Payer: Self-pay

## 2018-03-25 ENCOUNTER — Other Ambulatory Visit: Payer: Self-pay

## 2018-03-25 ENCOUNTER — Ambulatory Visit (HOSPITAL_BASED_OUTPATIENT_CLINIC_OR_DEPARTMENT_OTHER): Payer: Medicare Other

## 2018-03-25 ENCOUNTER — Emergency Department (HOSPITAL_COMMUNITY): Payer: Medicare Other

## 2018-03-25 ENCOUNTER — Emergency Department (HOSPITAL_COMMUNITY)
Admission: EM | Admit: 2018-03-25 | Discharge: 2018-03-26 | Disposition: A | Discharge status: Home / Self Care | Payer: Medicare Other | Attending: Emergency Medicine | Admitting: Emergency Medicine

## 2018-03-25 DIAGNOSIS — M7989 Other specified soft tissue disorders: Secondary | ICD-10-CM

## 2018-03-25 DIAGNOSIS — N186 End stage renal disease: Secondary | ICD-10-CM | POA: Diagnosis not present

## 2018-03-25 DIAGNOSIS — IMO0002 Reserved for concepts with insufficient information to code with codable children: Secondary | ICD-10-CM

## 2018-03-25 DIAGNOSIS — I120 Hypertensive chronic kidney disease with stage 5 chronic kidney disease or end stage renal disease: Secondary | ICD-10-CM | POA: Diagnosis not present

## 2018-03-25 DIAGNOSIS — T829XXA Unspecified complication of cardiac and vascular prosthetic device, implant and graft, initial encounter: Secondary | ICD-10-CM

## 2018-03-25 DIAGNOSIS — E039 Hypothyroidism, unspecified: Secondary | ICD-10-CM | POA: Insufficient documentation

## 2018-03-25 DIAGNOSIS — Z79899 Other long term (current) drug therapy: Secondary | ICD-10-CM | POA: Diagnosis not present

## 2018-03-25 DIAGNOSIS — G8929 Other chronic pain: Secondary | ICD-10-CM

## 2018-03-25 DIAGNOSIS — E119 Type 2 diabetes mellitus without complications: Secondary | ICD-10-CM | POA: Diagnosis not present

## 2018-03-25 DIAGNOSIS — R229 Localized swelling, mass and lump, unspecified: Secondary | ICD-10-CM

## 2018-03-25 DIAGNOSIS — M25511 Pain in right shoulder: Secondary | ICD-10-CM | POA: Insufficient documentation

## 2018-03-25 DIAGNOSIS — R52 Pain, unspecified: Secondary | ICD-10-CM

## 2018-03-25 LAB — CBC WITH DIFFERENTIAL/PLATELET
Abs Immature Granulocytes: 0.12 10*3/uL — ABNORMAL HIGH (ref 0.00–0.07)
BASOS PCT: 1 %
Basophils Absolute: 0.1 10*3/uL (ref 0.0–0.1)
Eosinophils Absolute: 0.1 10*3/uL (ref 0.0–0.5)
Eosinophils Relative: 1 %
HCT: 31.7 % — ABNORMAL LOW (ref 39.0–52.0)
Hemoglobin: 8.7 g/dL — ABNORMAL LOW (ref 13.0–17.0)
Immature Granulocytes: 1 %
Lymphocytes Relative: 11 %
Lymphs Abs: 1 10*3/uL (ref 0.7–4.0)
MCH: 27.4 pg (ref 26.0–34.0)
MCHC: 27.4 g/dL — ABNORMAL LOW (ref 30.0–36.0)
MCV: 100 fL (ref 80.0–100.0)
Monocytes Absolute: 0.5 10*3/uL (ref 0.1–1.0)
Monocytes Relative: 6 %
NEUTROS PCT: 80 %
Neutro Abs: 7.4 10*3/uL (ref 1.7–7.7)
Platelets: 187 10*3/uL (ref 150–400)
RBC: 3.17 MIL/uL — ABNORMAL LOW (ref 4.22–5.81)
RDW: 17.5 % — ABNORMAL HIGH (ref 11.5–15.5)
WBC: 9.1 10*3/uL (ref 4.0–10.5)
nRBC: 0 % (ref 0.0–0.2)

## 2018-03-25 LAB — BASIC METABOLIC PANEL
Anion gap: 16 — ABNORMAL HIGH (ref 5–15)
BUN: 46 mg/dL — ABNORMAL HIGH (ref 6–20)
CO2: 22 mmol/L (ref 22–32)
CREATININE: 5.65 mg/dL — AB (ref 0.61–1.24)
Calcium: 6.4 mg/dL — CL (ref 8.9–10.3)
Chloride: 100 mmol/L (ref 98–111)
GFR calc Af Amer: 12 mL/min — ABNORMAL LOW (ref >60)
GFR calc non Af Amer: 10 mL/min — ABNORMAL LOW (ref >60)
Glucose, Bld: 124 mg/dL — ABNORMAL HIGH (ref 70–99)
Potassium: 5.1 mmol/L (ref 3.5–5.1)
Sodium: 138 mmol/L (ref 135–145)

## 2018-03-25 MED ORDER — SODIUM CHLORIDE 0.9 % IV SOLN: 1.0000 g | Freq: Once | INTRAVENOUS | Status: DC

## 2018-03-25 MED ORDER — SODIUM CHLORIDE 0.9 % IV SOLN
1.0000 g | Freq: Once | INTRAVENOUS | Status: DC
  Filled 2018-03-25 (×2): qty 10

## 2018-03-25 MED ORDER — CALCIUM GLUCONATE-NACL 1-0.675 GM/50ML-% IV SOLN
1.0000 g | Freq: Once | INTRAVENOUS | Status: AC
  Administered 2018-03-25: 1000 mg via INTRAVENOUS
  Filled 2018-03-25 (×2): qty 50

## 2018-03-25 MED ORDER — CALCIUM GLUCONATE-NACL 1-0.675 GM/50ML-% IV SOLN
1.0000 g | Freq: Once | INTRAVENOUS | Status: AC
  Administered 2018-03-25: 1000 mg via INTRAVENOUS

## 2018-03-25 MED ORDER — OXYCODONE HCL 5 MG PO TABS
5.0000 mg | ORAL_TABLET | Freq: Once | ORAL | Status: AC
  Administered 2018-03-25: 5 mg via ORAL
  Filled 2018-03-25: qty 1

## 2018-03-25 NOTE — ED Provider Notes (Signed)
[facility] EMERGENCY DEPARTMENT Provider Note   CSN: [id number] Arrival date & time: 03/25/18  1246     History   Chief Complaint Chief Complaint  Patient presents with  . Extremity Pain    right arm and leg    HPI [name] is a 63 y.o. male with history of ESRD last dialysis 2 days ago presenting for right upper extremity pain.  Patient states that he had his fistula placed 3 years ago and has had continuous right arm pain since that time.  He states that during placement of his fistula the nerves in his arm "got messed up".  Patient states that he has had continuous pain for the past 3 years that waxes and wanes in intensity.  Patient endorses a constant moderate intensity aching pain made worse with movement of the right shoulder and improved with lying still.  Patient states that he has had increased right upper arm pain for the past 3 weeks.  Additionally he endorses some tingling sensation to his right hand but states that this is been continuous since fistula placement 3 years ago.  Patient denies recent fall or injury, he states that he did have a fall in September 2019 but was evaluated and cleared without injury.  Of note patient is morbidly obese with left BKA, with chronic wound to right lower extremity that he is currently seeing wound care for.  HPI  Past Medical History:  Diagnosis Date  . Anemia   . Anxiety   . AR (allergic rhinitis)   . Arthritis    osteoarthritis  . Blood transfusion   . BPH (benign prostatic hypertrophy)   . Cellulitis of left leg 08/14/2012  . Cholelithiasis   . Chronic kidney disease    hx of kidney stones  . Chronic pain   . Diabetes mellitus    insulin dependent  . Displacement of lumbar intervertebral disc without myelopathy    L4-L5  . Diverticulosis of colon   . DVT (deep venous thrombosis) (HCC)    Lower Extremity  . Embolism and thrombosis of unspecified artery ([place])   . ESRD (end stage renal  disease) ([place])   . Gallstone   . History of nephrolithiasis    Bilateral  . Hyperlipidemia   . Hypertension   . Hypertrophy of prostate   . Hypothyroidism   . Impotence of organic origin   . Morbid obesity ([place])   . Nephrolithiasis   . Non-pressure chronic ulcer of left calf, limited to breakdown of skin ([place]) 02/07/2016  . Osteomyelitis ([place])    left tibia and left knee  . Other lymphedema   . Pancreatitis, acute   . Peripheral vascular disease ([place])   . Personal history of arthritis    Osteoarthritis  . Pyogenic arthritis of left knee joint ([place])   . S/P BKA (below knee amputation) unilateral ([place]) 01/22/2015  . Sleep apnea    uses cpap  . Subacute osteomyelitis of left tibia ([place]) 03/05/2017  . Traumatic closed nondisplaced fracture of neck of femur, left, sequela 03/05/2017  . Unspecified septicemia(038.9) [facility])     Patient Active Problem List   Diagnosis Date Noted  . Pressure injury of skin 03/12/2017  . S/P AKA (above knee amputation) unilateral, left ([place]) 03/09/2017  . Closed fracture of lower end of right femur with routine healing 03/05/2017  . DDD (degenerative disc disease), lumbosacral   . Morbid obesity ([place])   . Anemia of chronic disease   .  Thrombocytopenia ([place])   . Idiopathic chronic venous hypertension of right lower extremity with inflammation   . Lower back pain 12/27/2016  . UTI (urinary tract infection) 12/27/2016  . Hypotension, unspecified 11/24/2015  . Left knee pain 11/24/2015  . Hypotension 11/24/2015  . Decubitus ulcer of buttock 01/22/2015  . Chronic venous insufficiency 09/08/2014  . Diabetes mellitus, type 2 ([place]) 09/08/2014  . ESRD on dialysis ([place]) 09/08/2014  . Hyperlipidemia 12/23/2013  . Diabetic foot ([place]) 11/20/2013  . Chronic pain 05/21/2013  . Chronic interstitial cystitis 02/06/2013  . UI (urinary incontinence) 10/29/2012  . CKD (chronic kidney disease) stage 3, GFR 30-59 ml/min (HCC) 08/18/2012  . Hyperkalemia 08/04/2012  .  Hypothyroidism, acquired 08/04/2012  . OBESITY 09/21/2009  . Essential hypertension 09/21/2009  . DIVERTICULOSIS OF COLON 09/21/2009  . Type 2 diabetes mellitus with diabetic nephropathy, with long-term current use of insulin ([place]) 10/01/2007  . DVT 10/01/2007    Past Surgical History:  Procedure Laterality Date  . AMPUTATION  02/07/2011   Procedure: AMPUTATION BELOW KNEE;  Surgeon: Newt Minion, MD;  Location: [facility];  Service: Orthopedics;  Laterality: Left;  Left Below Knee Amputation  . AMPUTATION Left 03/09/2017   Procedure: LEFT ABOVE KNEE AMPUTATION;  Surgeon: Newt Minion, MD;  Location: [facility];  Service: Orthopedics;  Laterality: Left;  . bilteral hip     replacement & revison  's   . CYSTOSCOPY W/ RETROGRADES Bilateral 10/24/2012   Procedure: CYSTOSCOPY WITH RETROGRADE PYELOGRAM Procedure: Cystoscopy, Bilateral Retrogarde Pyelograms, Bladder Biopsy, Hydrodistension;  Surgeon: Franchot Gallo, MD;  Location: [facility];  Service: Urology;  Laterality: Bilateral;  . KIDNEY SURGERY    . KNEE SURGERY     left knee    . LEG AMPUTATION BELOW KNEE    . REPLACEMENT TOTAL KNEE    . TONSILLECTOMY          Home Medications    Prior to Admission medications   Medication Sig Start Date End Date Taking? Authorizing Provider  acetaminophen (TYLENOL) 500 MG tablet Take 500 mg by mouth every 6 (six) hours as needed for moderate pain.    [provider]  atorvastatin (LIPITOR) 40 MG tablet TAKE 1 TABLET (40 MG TOTAL) BY MOUTH DAILY. Patient taking differently: Take 40 mg by mouth daily.  09/13/15   Brunetta Jeans, PA-C  gabapentin (NEURONTIN) 100 MG capsule TAKE 1 CAPSULE (100 MG TOTAL) BY MOUTH 3 (THREE) TIMES DAILY. Patient taking differently: Take 100 mg by mouth 3 (three) times daily.  12/26/15   Brunetta Jeans, PA-C  HYDROcodone-acetaminophen (NORCO/VICODIN) 5-325 MG tablet Take 1 tablet by mouth 2 (two) times daily as needed (pain). 12/25/17   Newt Minion, MD    insulin aspart (NOVOLOG) 100 UNIT/ML injection Inject 3-10 Units as directed 2 (two) times daily. Sliding scale 100-150 3 units 151-200 4 units 201-250 6 units 251-300 8 units 301-350 10 units 09/27/16   [provider]  Insulin Glargine (BASAGLAR KWIKPEN) 100 UNIT/ML SOPN Inject 30 Units into the skin at bedtime.    [provider]  levothyroxine (SYNTHROID, LEVOTHROID) 200 MCG tablet Take 1 tablet (200 mcg total) by mouth every morning. Patient taking differently: Take 225 mcg by mouth every morning.  01/10/16   Brunetta Jeans, PA-C  loperamide (IMODIUM A-D) 2 MG tablet Take 2 mg by mouth daily.    [provider]  midodrine (PROAMATINE) 5 MG tablet Take 5 mg by mouth 3 (three) times daily with meals.  06/02/16   [provider]  sevelamer carbonate (RENVELA) 800 MG tablet Take 800-1,600 mg by mouth 3 (three) times daily with meals.     [provider]  traMADol (ULTRAM) 50 MG tablet Take 50 mg by mouth as needed for pain. 11/08/17   [provider]    Family History Family History  Problem Relation Age of Onset  . Diabetes Mother   . Heart attack Mother   . Stroke Mother   . Colon cancer Father   . Dementia Father   . Heart attack Father   . Arthritis Paternal Grandmother   . Hypertension Other   . Hypothyroidism Other     Social History Social History   Tobacco Use  . Smoking status: Never Smoker  . Smokeless tobacco: Never Used  Substance Use Topics  . Alcohol use: No  . Drug use: No     Allergies   Patient has no known allergies.   Review of Systems Review of Systems  Constitutional: Negative.  Negative for chills and fever.  Respiratory: Negative.  Negative for cough and shortness of breath.   Cardiovascular: Negative.  Negative for chest pain.  Gastrointestinal: Negative.  Negative for abdominal pain, nausea and vomiting.  Musculoskeletal: Positive for arthralgias and myalgias. Negative for neck pain.   Neurological: Positive for numbness (Right hand tingling for the past 3years without change). Negative for headaches.  All other systems reviewed and are negative.   Physical Exam Updated Vital Signs BP (!) 91/54   Pulse 77   Temp 97.9 F (36.6 C)   Resp 11   Ht 6' (1.829 m)   Wt 119.3 kg   SpO2 97%   BMI 35.67 kg/m   Physical Exam Constitutional:      General: He is not in acute distress.    Appearance: He is well-developed. He is obese. He is not ill-appearing or diaphoretic.  HENT:     Head: Normocephalic and atraumatic.     Right Ear: External ear normal.     Left Ear: External ear normal.     Nose: Nose normal.  Eyes:     Pupils: Pupils are equal, round, and reactive to light.  Neck:     Musculoskeletal: Normal range of motion.     Trachea: Trachea normal. No tracheal deviation.  Cardiovascular:     Rate and Rhythm: Normal rate and regular rhythm.  Pulmonary:     Effort: Pulmonary effort is normal. No respiratory distress.     Breath sounds: Normal breath sounds.  Chest:     Chest wall: No deformity, tenderness or crepitus.  Abdominal:     Palpations: Abdomen is soft.     Tenderness: There is no abdominal tenderness. There is no guarding or rebound.    Musculoskeletal: Normal range of motion.       Arms:     Comments: Fistula in place to the right upper extremity, thrill present. No tenderness to palpation of the fistula, no overlying erythema or drainage at the fistula site.  Fistula palpated, soft and without tenderness.  Cervical Spine: Appearance normal. No obvious bony deformity. No skin swelling, erythema, heat, fluctuance or break of the skin. No TTP over the cervical spinous processes. No paraspinal tenderness. No step-offs. Patient is able to actively rotate their neck 45 degrees left and right voluntarily without pain and flex and extend the neck without pain.   Right Shoulder: Appearance normal but obese. No obvious bony deformity. No skin swelling,  erythema, heat,  fluctuance or break of the skin. No clavicular deformity or TTP. Patient with diffuse muscular tenderness to the right deltoid, right trapezius and right biceps.  States that pain is worse with movement.  Active and passive flexion, extension, abduction, adduction, and internal/external rotation intact with increased pain but without crepitus. Strength for flexion, extension, abduction, adduction, and internal/external rotation intact and appropriate for age and body habitus.  Right Elbow: Appearance normal. No obvious bony deformity. No skin swelling, erythema, heat, fluctuance or break of the skin. No TTP over joint. Active flexion, extension, supination and pronation full and intact without pain. Strength able and appropriate for age for flexion and extension.  Cap refill <2 seconds. SILT for M/U/R distributions but does endorse tingling sensation. Compartments soft.  Grip equal bilaterally.   Skin:    General: Skin is warm and dry.  Neurological:     Mental Status: He is alert.     GCS: GCS eye subscore is 4. GCS verbal subscore is 5. GCS motor subscore is 6.     Comments: Speech is clear and goal oriented, follows commands Major Cranial nerves without deficit, no facial droop Equal grip strength bilaterally. Sensation normal to light touch in remaining extremities. Moves extremities without ataxia, coordination intact  Psychiatric:        Behavior: Behavior normal.      ED Treatments / Results  Labs (all labs ordered are listed, but only abnormal results are displayed) Labs Reviewed  CBC WITH DIFFERENTIAL/PLATELET - Abnormal; Notable for the following components:      Result Value   RBC 3.17 (*)    Hemoglobin 8.7 (*)    HCT 31.7 (*)    MCHC 27.4 (*)    RDW 17.5 (*)    Abs Immature Granulocytes 0.12 (*)    All other components within normal limits  BASIC METABOLIC PANEL - Abnormal; Notable for the following components:   Glucose, Bld 124 (*)    BUN 46 (*)     Creatinine, Ser 5.65 (*)    Calcium 6.4 (*)    GFR calc non Af Amer 10 (*)    GFR calc Af Amer 12 (*)    Anion gap 16 (*)    All other components within normal limits    EKG None  Radiology Dg Humerus Right  Result Date: 03/25/2018 CLINICAL DATA:  Pain at mid shaft of humerus at fistula site EXAM: RIGHT HUMERUS - 2+ VIEW COMPARISON: IS UP: COMPARISON: IS UP None FINDINGS: Is vascular stents identified at the RIGHT axilla. Numerous surgical clips at the mid and distal upper arm as well as at the RIGHT elbow. Tortuous facial identified. Abnormal prominent soft tissue density measuring approximately 7.4 x 4.5 x 3.5 cm superimposed with the fistula at the distal RIGHT upper arm could represent an aneurysm or pseudoaneurysm, potentially hematoma, or other soft tissue mass. Bones demineralized. No fracture, dislocation or bone destruction.  The optic IMPRESSION: No acute osseous abnormalities. Abnormal focal soft tissue opacity at the fistula at the distal RIGHT upper arm 7.4 x 4.5 x 3.5 cm, concerning for aneurysm versus pseudoaneurysm, or potentially hematoma; vascular ultrasound assessment recommended for evaluation. Findings called to Nuala Alpha PA on 03/25/2018 at 1445 hours. Electronically Signed   By: Lavonia Dana M.D.   On: 03/25/2018 14:46   [place] Rt Upper Extrem Ltd Soft Tissue Non Vascular  Result Date: 03/25/2018 CLINICAL DATA:  63 year old patient with AV fistula place 12/09/2014 with soft tissue prominence seen on plain radiographs of  the arm dated 03/25/2018 EXAM: ULTRASOUND RIGHT UPPER EXTREMITY LIMITED TECHNIQUE: Ultrasound examination of the upper extremity soft tissues was performed in the area of clinical concern. COMPARISON:  03/26/2027 radiographs FINDINGS: The soft tissue prominence seen in the distal right upper extremity on the earlier same day radiographs corresponds with the patient's AV fistula measuring approximately 7.5 x 3.1 x 2.9 cm in craniocaudad by AP by transverse. No  thrombosis is identified. No soft tissue mass nor on accountable soft tissue collections. IMPRESSION: Dilated AV fistula to 7.5 x 3.1 x 2.3 cm accounting for the soft tissue abnormality identified. No thrombosis of the fistula is identified. Electronically Signed   By: Ashley Royalty M.D.   On: 03/25/2018 18:21   Ue Venous Duplex ([facility] Only)  Result Date: 03/25/2018 UPPER VENOUS STUDY  Indications: swelling at fistula Other Indications: Brachiocephalic AVF.  Examination Guidelines: A complete evaluation includes B-mode imaging, spectral Doppler, color Doppler, and power Doppler as needed of all accessible portions of each vessel. Bilateral testing is considered an integral part of a complete examination. Limited examinations for reoccurring indications may be performed as noted.  Right Findings: +--------+------------+----------+---------+-----------+-------+ RIGHT   CompressiblePropertiesPhasicitySpontaneousSummary +--------+------------+----------+---------+-----------+-------+ Axillary    Full                 Yes       Yes            +--------+------------+----------+---------+-----------+-------+ Brachial    Full                 Yes       Yes            +--------+------------+----------+---------+-----------+-------+ Radial      Full                                          +--------+------------+----------+---------+-----------+-------+ Cephalic    Full                                          +--------+------------+----------+---------+-----------+-------+ Basilic     Full                                          +--------+------------+----------+---------+-----------+-------+  Summary:  Right: No evidence of DVT. Right AV fistula is patent with dilation of cephalic vein (outflow vein) at mid upper arm.  *See table(s) above for measurements and observations.    Preliminary     Procedures Procedures (including critical care time)  Medications Ordered in  ED Medications  calcium gluconate 1 g/ 50 mL sodium chloride IVPB (has no administration in time range)  oxyCODONE (Oxy IR/ROXICODONE) immediate release tablet 5 mg (5 mg Oral Given 03/25/18 1457)  calcium gluconate 1 g/ 50 mL sodium chloride IVPB (1,000 mg Intravenous New Bag/Given 03/25/18 2134)     Initial Impression / Assessment and Plan / ED Course  I have reviewed the triage vital signs and the nursing notes.  Pertinent labs & imaging results that were available during my care of the patient were reviewed by me and considered in my medical decision making (see chart for details).    Patient presenting for right shoulder/upper arm pain that has been ongoing for  the past 3 years.  Worsening over the past few weeks.  No injury/trauma to the area.  Patient is dialysis patient with fistula in this area, no tenderness to the fistula and thrill is present.  Patient is overall well-appearing and in no acute distress.  Patient with appropriate range of motion of the shoulder for body habitus.  Sensation, capillary refill and movement intact.  Equal grip strength.  CBC appears baseline BMP with hypocalcemia, otherwise at baseline Plan from of right humerus concerning for mass, no acute orthopedic findings ------------------- Case discussed with Dr. Wilson Singer, IV calcium ordered, commended ultrasound studies ordered.  Patient seen and evaluated by Dr. Wilson Singer who agrees with plan of care at this time. -------------------- Patient reevaluated multiple times during ED visit, resting comfortably and in no acute distress.  Patient using both hands to use his smart phone.  Patient states some improvement of pain following oxycodone here. ------------------- Ultrasound studies:  IMPRESSION:  Dilated AV fistula to 7.5 x 3.1 x 2.3 cm accounting for the soft  tissue abnormality identified. No thrombosis of the fistula is  identified.  -------------- IV and pharmacy delay, patient currently receiving calcium  infusion.  Care handoff given to Dr. Wilson Singer shift change.    Note: Portions of this report may have been transcribed using voice recognition software. Every effort was made to ensure accuracy; however, inadvertent computerized transcription errors may still be present. Final Clinical Impressions(s) / ED Diagnoses   Final diagnoses:  Mass  Chronic right shoulder pain  Complication of arteriovenous dialysis fistula, initial encounter  Hypocalcemia    ED Discharge Orders    None       Gari Crown 03/25/18 2248    Virgel Manifold, MD 03/26/18 [phone]

## 2018-03-25 NOTE — Discharge Instructions (Addendum)
You have been diagnosed today with Right Shoulder Pain, Hypocalcemia, Dilation of Fistula.  At this time there does not appear to be the presence of an emergent medical condition, however there is always the potential for conditions to change. Please read and follow the below instructions.  Please return to the Emergency Department immediately for any new or worsening symptoms or if your symptoms do not improve. Please be sure to follow up with your Primary Care Provider this week regarding your visit today; please call their office to schedule an appointment even if you are feeling better for a follow-up visit.    Please read the additional information packets attached to your discharge summary.  Do not take your medicine if  develop an itchy rash, swelling in your mouth or lips, or difficulty breathing.

## 2018-03-25 NOTE — Progress Notes (Signed)
RUE venous duplex       has been completed. Preliminary results can be found under CV proc through chart review. Lise Pincus, BS, RDMS, RVT   

## 2018-03-25 NOTE — ED Triage Notes (Signed)
Pt states he fell in September 2019 and had pain in right arm and leg that was xrayed and cleared for injuries.  States it still hurts and recently got worse over the past few weeks.

## 2018-03-25 NOTE — ED Notes (Signed)
Calcium 6.4

## 2018-03-25 NOTE — ED Notes (Addendum)
IV team arrived. PT out at imaging. IV team will return.

## 2018-09-23 DIAGNOSIS — J9621 Acute and chronic respiratory failure with hypoxia: Secondary | ICD-10-CM | POA: Insufficient documentation

## 2018-09-24 DIAGNOSIS — Z9119 Patient's noncompliance with other medical treatment and regimen: Secondary | ICD-10-CM | POA: Insufficient documentation

## 2018-09-24 DIAGNOSIS — Z91199 Patient's noncompliance with other medical treatment and regimen due to unspecified reason: Secondary | ICD-10-CM | POA: Insufficient documentation

## 2018-09-27 DIAGNOSIS — Z7409 Other reduced mobility: Secondary | ICD-10-CM | POA: Insufficient documentation

## 2018-09-27 DIAGNOSIS — Z789 Other specified health status: Secondary | ICD-10-CM | POA: Insufficient documentation

## 2018-09-29 IMAGING — DX DG PELVIS 1-2V
2 series · 2 of 2 positions shown · non-contrast
Comparison: 01/24/2017.

CLINICAL DATA: Diffuse pelvic pain following a fall several days
ago.

EXAM:
PELVIS - 1-2 VIEW

[pelvis ap (1 of 2)]
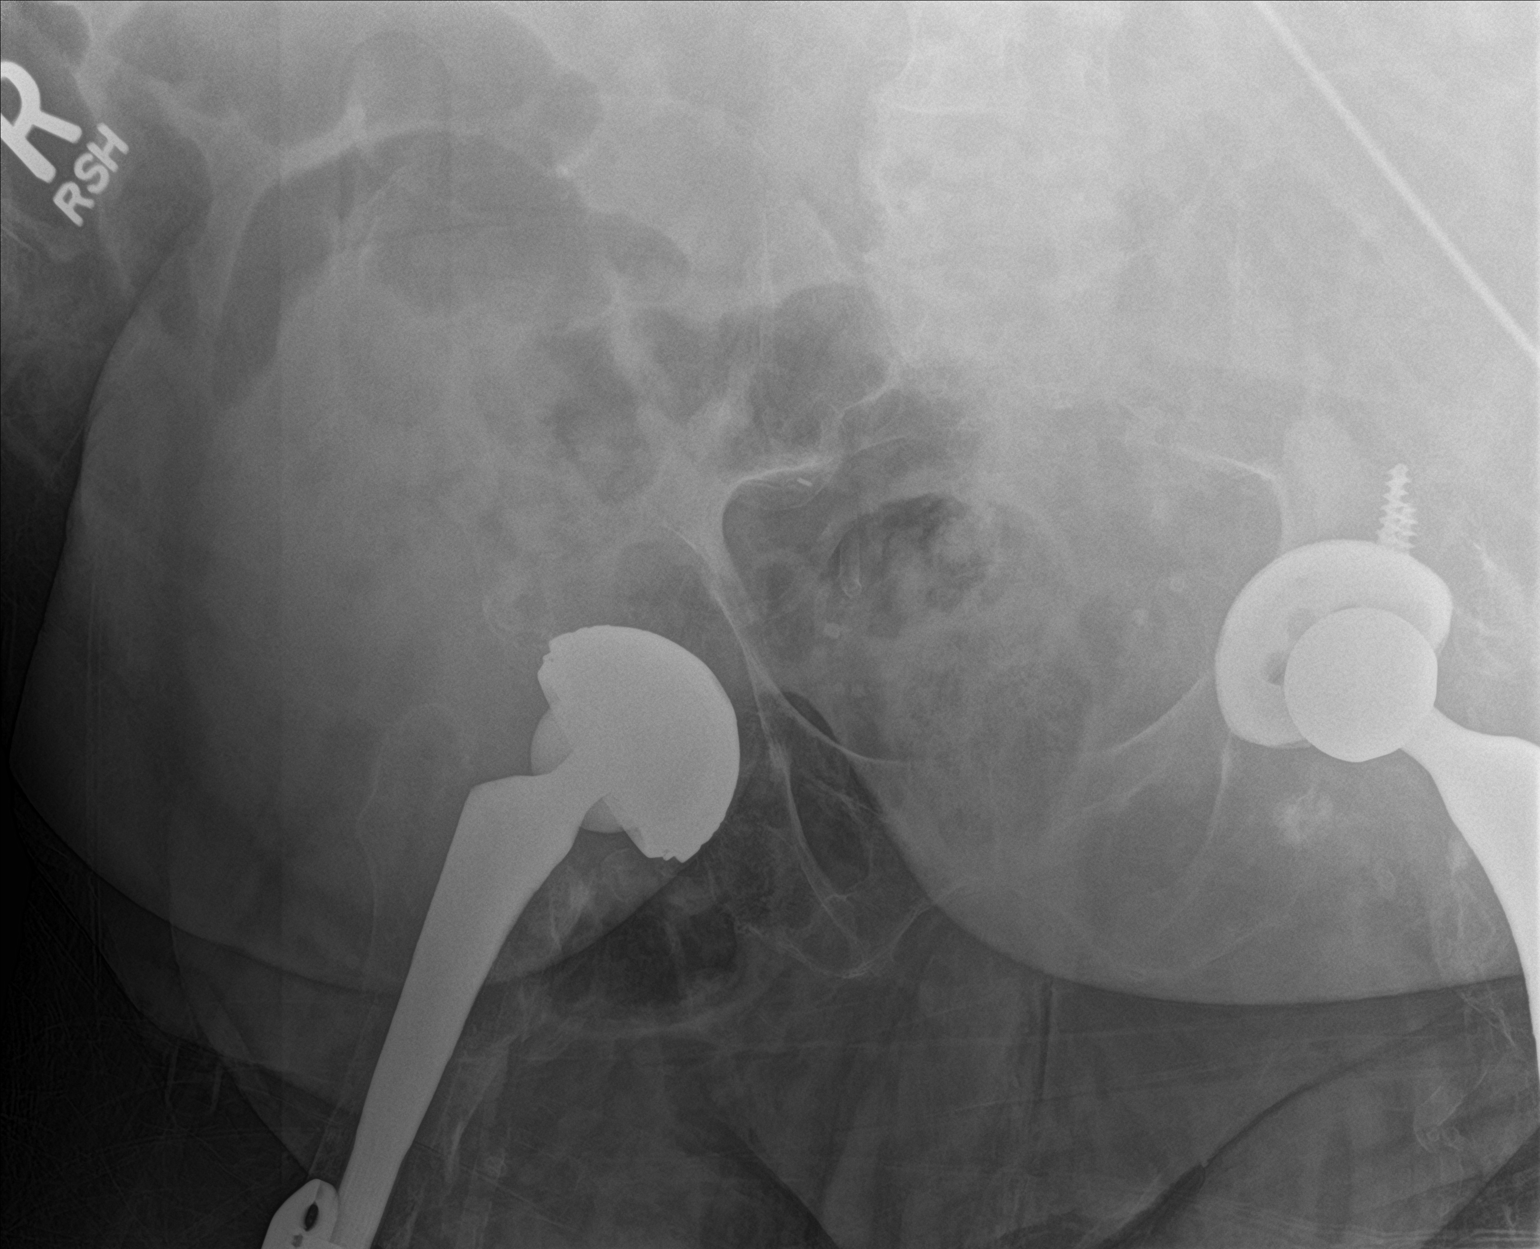

[pelvis ap (2 of 2)]
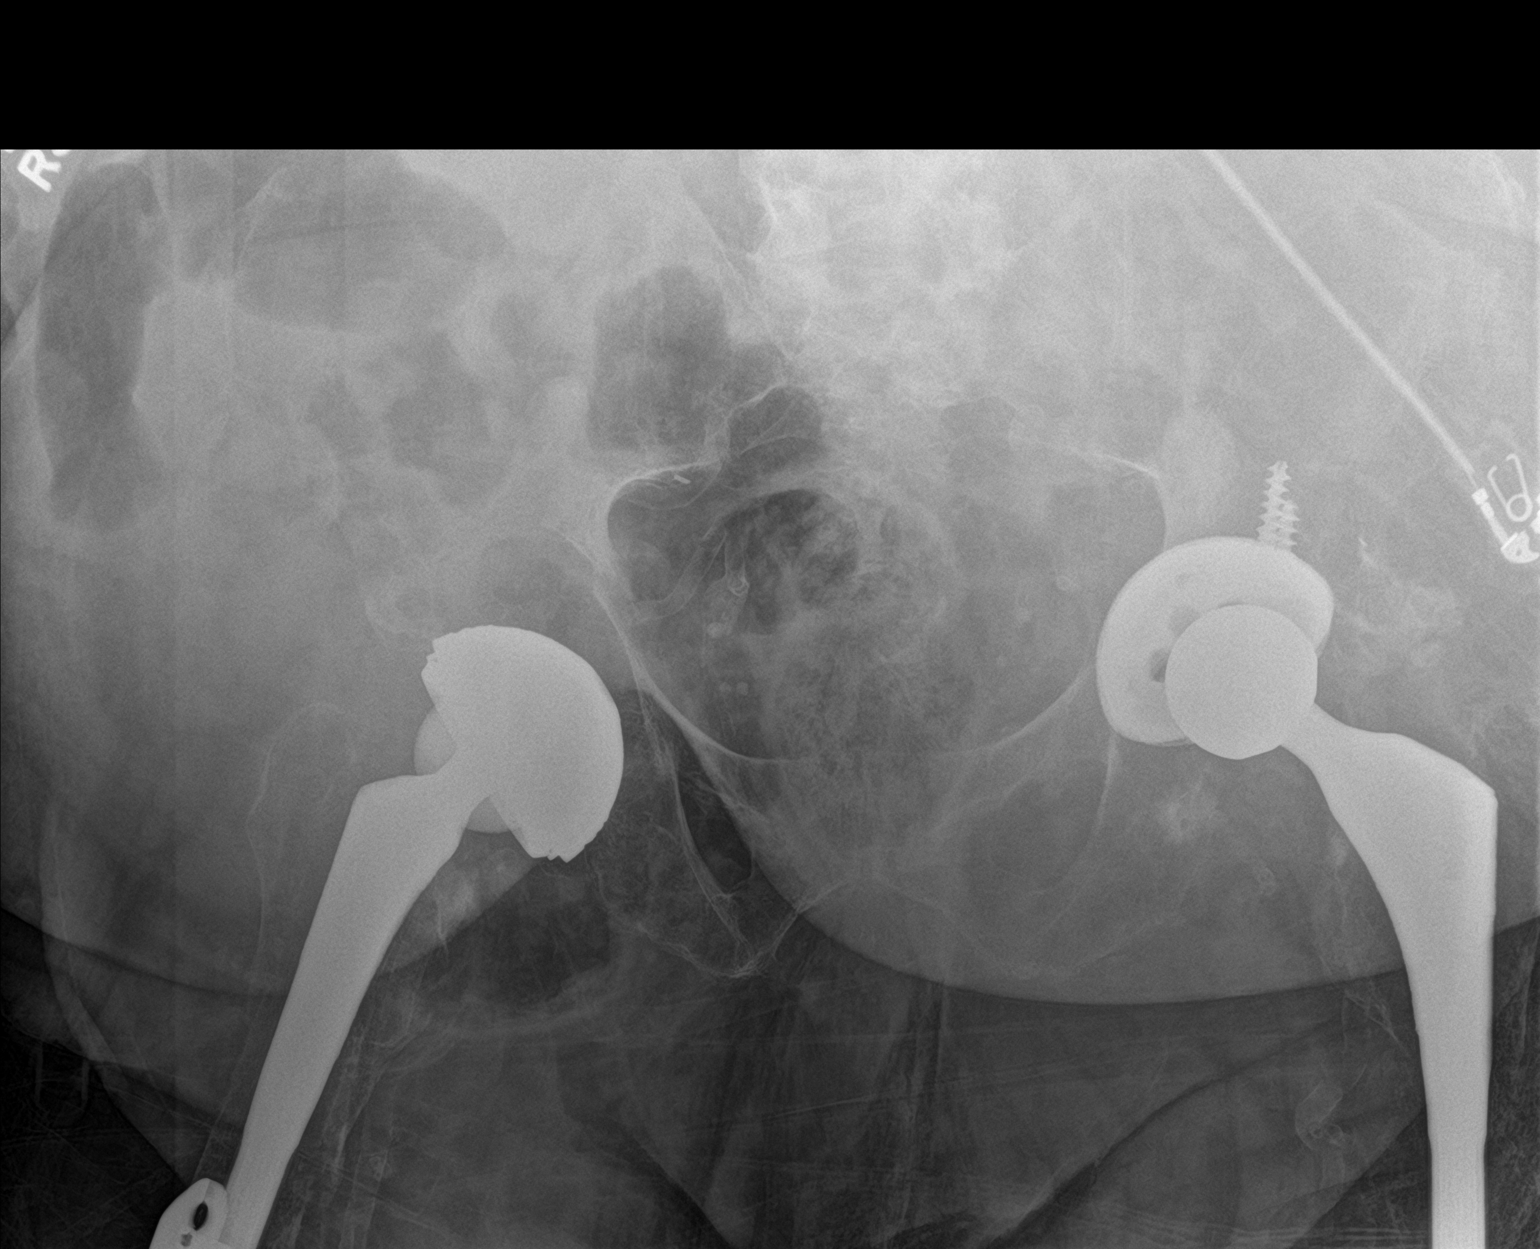

[2 of 2 positions shown; findings below may reference images not displayed]

FINDINGS: Again demonstrated are bilateral hip prostheses with protrusio
acetabuli on the left and plate and screw fixation of the right
femoral shaft. The bones are difficult to visualize due to severe
osteopenia. No gross fracture or dislocation seen. Diffuse arterial
calcifications are noted.
IMPRESSION: 1. Marked osteopenia with no gross fracture or dislocation.
2. Hardware intact.

## 2018-10-18 ENCOUNTER — Telehealth: Payer: Self-pay | Admitting: Orthopedic Surgery

## 2018-10-18 NOTE — Telephone Encounter (Signed)
Patient has an appointment with Dr. Lorin Mercy for a back fx, but wants to know if Dr. Sharol Given would send him in an RX for pain medication until he can get into see Dr. Lorin Mercy.  CB#[phone].  Thank you.

## 2018-10-21 ENCOUNTER — Other Ambulatory Visit: Payer: Self-pay | Admitting: Orthopedic Surgery

## 2018-10-21 MED ORDER — HYDROCODONE-ACETAMINOPHEN 5-325 MG PO TABS: 1.0000 | ORAL_TABLET | Freq: Four times a day (QID) | ORAL | 0 refills | Status: AC | PRN

## 2018-10-21 NOTE — Telephone Encounter (Signed)
Dr Sharol Given please advise, pt appt 11/05/2018 with Dr Lorin Mercy. He has not been in to see you in the office since 11/14/2017. Thank you

## 2018-10-21 NOTE — Telephone Encounter (Signed)
rx sent

## 2018-10-21 NOTE — Telephone Encounter (Signed)
Patient was notified.

## 2018-11-05 ENCOUNTER — Ambulatory Visit: Payer: Medicare Other | Admitting: Orthopaedic Surgery

## 2018-11-08 ENCOUNTER — Ambulatory Visit: Payer: Medicare Other | Admitting: Orthopaedic Surgery

## 2018-11-24 ENCOUNTER — Emergency Department (HOSPITAL_COMMUNITY): Payer: Medicare Other

## 2018-11-24 ENCOUNTER — Other Ambulatory Visit: Payer: Self-pay

## 2018-11-24 ENCOUNTER — Inpatient Hospital Stay (HOSPITAL_COMMUNITY)
Admission: EM | Admit: 2018-11-24 | Discharge: 2018-12-29 | DRG: 592 | Disposition: E | Payer: Medicare Other | Attending: Pulmonary Disease | Admitting: Pulmonary Disease

## 2018-11-24 ENCOUNTER — Encounter (HOSPITAL_COMMUNITY): Payer: Self-pay

## 2018-11-24 DIAGNOSIS — Z794 Long term (current) use of insulin: Secondary | ICD-10-CM

## 2018-11-24 DIAGNOSIS — J90 Pleural effusion, not elsewhere classified: Secondary | ICD-10-CM | POA: Diagnosis present

## 2018-11-24 DIAGNOSIS — N400 Enlarged prostate without lower urinary tract symptoms: Secondary | ICD-10-CM | POA: Diagnosis present

## 2018-11-24 DIAGNOSIS — N200 Calculus of kidney: Secondary | ICD-10-CM | POA: Insufficient documentation

## 2018-11-24 DIAGNOSIS — Z4659 Encounter for fitting and adjustment of other gastrointestinal appliance and device: Secondary | ICD-10-CM

## 2018-11-24 DIAGNOSIS — Z96659 Presence of unspecified artificial knee joint: Secondary | ICD-10-CM | POA: Diagnosis present

## 2018-11-24 DIAGNOSIS — S72401A Unspecified fracture of lower end of right femur, initial encounter for closed fracture: Secondary | ICD-10-CM | POA: Diagnosis present

## 2018-11-24 DIAGNOSIS — L8990 Pressure ulcer of unspecified site, unspecified stage: Secondary | ICD-10-CM

## 2018-11-24 DIAGNOSIS — I9589 Other hypotension: Secondary | ICD-10-CM | POA: Diagnosis present

## 2018-11-24 DIAGNOSIS — D696 Thrombocytopenia, unspecified: Secondary | ICD-10-CM | POA: Diagnosis not present

## 2018-11-24 DIAGNOSIS — N529 Male erectile dysfunction, unspecified: Secondary | ICD-10-CM | POA: Diagnosis present

## 2018-11-24 DIAGNOSIS — E669 Obesity, unspecified: Secondary | ICD-10-CM | POA: Diagnosis present

## 2018-11-24 DIAGNOSIS — I132 Hypertensive heart and chronic kidney disease with heart failure and with stage 5 chronic kidney disease, or end stage renal disease: Secondary | ICD-10-CM | POA: Diagnosis present

## 2018-11-24 DIAGNOSIS — Z87442 Personal history of urinary calculi: Secondary | ICD-10-CM

## 2018-11-24 DIAGNOSIS — F419 Anxiety disorder, unspecified: Secondary | ICD-10-CM | POA: Diagnosis present

## 2018-11-24 DIAGNOSIS — Z6833 Body mass index (BMI) 33.0-33.9, adult: Secondary | ICD-10-CM

## 2018-11-24 DIAGNOSIS — L89104 Pressure ulcer of unspecified part of back, stage 4: Secondary | ICD-10-CM | POA: Diagnosis not present

## 2018-11-24 DIAGNOSIS — Z515 Encounter for palliative care: Secondary | ICD-10-CM | POA: Diagnosis present

## 2018-11-24 DIAGNOSIS — G8929 Other chronic pain: Secondary | ICD-10-CM | POA: Diagnosis present

## 2018-11-24 DIAGNOSIS — E1169 Type 2 diabetes mellitus with other specified complication: Secondary | ICD-10-CM | POA: Diagnosis present

## 2018-11-24 DIAGNOSIS — I5022 Chronic systolic (congestive) heart failure: Secondary | ICD-10-CM | POA: Diagnosis present

## 2018-11-24 DIAGNOSIS — Z7189 Other specified counseling: Secondary | ICD-10-CM

## 2018-11-24 DIAGNOSIS — E039 Hypothyroidism, unspecified: Secondary | ICD-10-CM | POA: Diagnosis present

## 2018-11-24 DIAGNOSIS — J9621 Acute and chronic respiratory failure with hypoxia: Secondary | ICD-10-CM | POA: Diagnosis not present

## 2018-11-24 DIAGNOSIS — I10 Essential (primary) hypertension: Secondary | ICD-10-CM

## 2018-11-24 DIAGNOSIS — L02211 Cutaneous abscess of abdominal wall: Secondary | ICD-10-CM | POA: Diagnosis present

## 2018-11-24 DIAGNOSIS — L89310 Pressure ulcer of right buttock, unstageable: Secondary | ICD-10-CM | POA: Diagnosis not present

## 2018-11-24 DIAGNOSIS — J9611 Chronic respiratory failure with hypoxia: Secondary | ICD-10-CM

## 2018-11-24 DIAGNOSIS — D631 Anemia in chronic kidney disease: Secondary | ICD-10-CM | POA: Diagnosis present

## 2018-11-24 DIAGNOSIS — E1122 Type 2 diabetes mellitus with diabetic chronic kidney disease: Secondary | ICD-10-CM | POA: Diagnosis present

## 2018-11-24 DIAGNOSIS — N5089 Other specified disorders of the male genital organs: Secondary | ICD-10-CM | POA: Diagnosis present

## 2018-11-24 DIAGNOSIS — S72491G Other fracture of lower end of right femur, subsequent encounter for closed fracture with delayed healing: Secondary | ICD-10-CM | POA: Diagnosis not present

## 2018-11-24 DIAGNOSIS — E1121 Type 2 diabetes mellitus with diabetic nephropathy: Secondary | ICD-10-CM

## 2018-11-24 DIAGNOSIS — Z79891 Long term (current) use of opiate analgesic: Secondary | ICD-10-CM

## 2018-11-24 DIAGNOSIS — I878 Other specified disorders of veins: Secondary | ICD-10-CM | POA: Diagnosis present

## 2018-11-24 DIAGNOSIS — I959 Hypotension, unspecified: Secondary | ICD-10-CM

## 2018-11-24 DIAGNOSIS — J9690 Respiratory failure, unspecified, unspecified whether with hypoxia or hypercapnia: Secondary | ICD-10-CM

## 2018-11-24 DIAGNOSIS — I872 Venous insufficiency (chronic) (peripheral): Secondary | ICD-10-CM | POA: Diagnosis present

## 2018-11-24 DIAGNOSIS — Z20828 Contact with and (suspected) exposure to other viral communicable diseases: Secondary | ICD-10-CM | POA: Diagnosis present

## 2018-11-24 DIAGNOSIS — Z993 Dependence on wheelchair: Secondary | ICD-10-CM

## 2018-11-24 DIAGNOSIS — Z0189 Encounter for other specified special examinations: Secondary | ICD-10-CM

## 2018-11-24 DIAGNOSIS — R627 Adult failure to thrive: Secondary | ICD-10-CM

## 2018-11-24 DIAGNOSIS — R6521 Severe sepsis with septic shock: Secondary | ICD-10-CM | POA: Diagnosis not present

## 2018-11-24 DIAGNOSIS — I272 Pulmonary hypertension, unspecified: Secondary | ICD-10-CM | POA: Diagnosis present

## 2018-11-24 DIAGNOSIS — Z8249 Family history of ischemic heart disease and other diseases of the circulatory system: Secondary | ICD-10-CM

## 2018-11-24 DIAGNOSIS — Z6837 Body mass index (BMI) 37.0-37.9, adult: Secondary | ICD-10-CM

## 2018-11-24 DIAGNOSIS — E1151 Type 2 diabetes mellitus with diabetic peripheral angiopathy without gangrene: Secondary | ICD-10-CM | POA: Diagnosis present

## 2018-11-24 DIAGNOSIS — K750 Abscess of liver: Secondary | ICD-10-CM | POA: Diagnosis present

## 2018-11-24 DIAGNOSIS — Z89612 Acquired absence of left leg above knee: Secondary | ICD-10-CM | POA: Diagnosis not present

## 2018-11-24 DIAGNOSIS — I4891 Unspecified atrial fibrillation: Secondary | ICD-10-CM | POA: Diagnosis not present

## 2018-11-24 DIAGNOSIS — L89154 Pressure ulcer of sacral region, stage 4: Secondary | ICD-10-CM | POA: Diagnosis present

## 2018-11-24 DIAGNOSIS — A419 Sepsis, unspecified organism: Secondary | ICD-10-CM | POA: Diagnosis not present

## 2018-11-24 DIAGNOSIS — Z789 Other specified health status: Secondary | ICD-10-CM

## 2018-11-24 DIAGNOSIS — N186 End stage renal disease: Secondary | ICD-10-CM | POA: Diagnosis present

## 2018-11-24 DIAGNOSIS — E8889 Other specified metabolic disorders: Secondary | ICD-10-CM | POA: Diagnosis present

## 2018-11-24 DIAGNOSIS — L97221 Non-pressure chronic ulcer of left calf limited to breakdown of skin: Secondary | ICD-10-CM | POA: Diagnosis present

## 2018-11-24 DIAGNOSIS — G4733 Obstructive sleep apnea (adult) (pediatric): Secondary | ICD-10-CM | POA: Diagnosis present

## 2018-11-24 DIAGNOSIS — Z9981 Dependence on supplemental oxygen: Secondary | ICD-10-CM

## 2018-11-24 DIAGNOSIS — I953 Hypotension of hemodialysis: Secondary | ICD-10-CM | POA: Diagnosis present

## 2018-11-24 DIAGNOSIS — Z452 Encounter for adjustment and management of vascular access device: Secondary | ICD-10-CM

## 2018-11-24 DIAGNOSIS — N12 Tubulo-interstitial nephritis, not specified as acute or chronic: Secondary | ICD-10-CM

## 2018-11-24 DIAGNOSIS — M5126 Other intervertebral disc displacement, lumbar region: Secondary | ICD-10-CM | POA: Diagnosis present

## 2018-11-24 DIAGNOSIS — Z833 Family history of diabetes mellitus: Secondary | ICD-10-CM

## 2018-11-24 DIAGNOSIS — I5033 Acute on chronic diastolic (congestive) heart failure: Secondary | ICD-10-CM | POA: Diagnosis not present

## 2018-11-24 DIAGNOSIS — J9601 Acute respiratory failure with hypoxia: Secondary | ICD-10-CM | POA: Diagnosis not present

## 2018-11-24 DIAGNOSIS — Z800 Family history of malignant neoplasm of digestive organs: Secondary | ICD-10-CM

## 2018-11-24 DIAGNOSIS — Z7401 Bed confinement status: Secondary | ICD-10-CM

## 2018-11-24 DIAGNOSIS — R0602 Shortness of breath: Secondary | ICD-10-CM

## 2018-11-24 DIAGNOSIS — E785 Hyperlipidemia, unspecified: Secondary | ICD-10-CM | POA: Diagnosis present

## 2018-11-24 DIAGNOSIS — R1310 Dysphagia, unspecified: Secondary | ICD-10-CM | POA: Diagnosis present

## 2018-11-24 DIAGNOSIS — G4730 Sleep apnea, unspecified: Secondary | ICD-10-CM | POA: Insufficient documentation

## 2018-11-24 DIAGNOSIS — Z992 Dependence on renal dialysis: Secondary | ICD-10-CM

## 2018-11-24 DIAGNOSIS — Z86718 Personal history of other venous thrombosis and embolism: Secondary | ICD-10-CM

## 2018-11-24 DIAGNOSIS — E875 Hyperkalemia: Secondary | ICD-10-CM | POA: Diagnosis not present

## 2018-11-24 DIAGNOSIS — Z66 Do not resuscitate: Secondary | ICD-10-CM | POA: Diagnosis present

## 2018-11-24 DIAGNOSIS — Z79890 Hormone replacement therapy (postmenopausal): Secondary | ICD-10-CM

## 2018-11-24 DIAGNOSIS — K5900 Constipation, unspecified: Secondary | ICD-10-CM | POA: Diagnosis present

## 2018-11-24 DIAGNOSIS — K4090 Unilateral inguinal hernia, without obstruction or gangrene, not specified as recurrent: Secondary | ICD-10-CM | POA: Diagnosis present

## 2018-11-24 DIAGNOSIS — J9622 Acute and chronic respiratory failure with hypercapnia: Secondary | ICD-10-CM | POA: Diagnosis not present

## 2018-11-24 DIAGNOSIS — Z906 Acquired absence of other parts of urinary tract: Secondary | ICD-10-CM

## 2018-11-24 DIAGNOSIS — S72401D Unspecified fracture of lower end of right femur, subsequent encounter for closed fracture with routine healing: Secondary | ICD-10-CM

## 2018-11-24 DIAGNOSIS — E1140 Type 2 diabetes mellitus with diabetic neuropathy, unspecified: Secondary | ICD-10-CM | POA: Diagnosis present

## 2018-11-24 DIAGNOSIS — Z79899 Other long term (current) drug therapy: Secondary | ICD-10-CM

## 2018-11-24 DIAGNOSIS — M1990 Unspecified osteoarthritis, unspecified site: Secondary | ICD-10-CM | POA: Diagnosis present

## 2018-11-24 LAB — SEDIMENTATION RATE: Sed Rate: 31 mm/hr — ABNORMAL HIGH (ref 0–16)

## 2018-11-24 LAB — CBC WITH DIFFERENTIAL/PLATELET
Abs Immature Granulocytes: 0.13 10*3/uL — ABNORMAL HIGH (ref 0.00–0.07)
Basophils Absolute: 0 10*3/uL (ref 0.0–0.1)
Basophils Relative: 0 %
Eosinophils Absolute: 0 10*3/uL (ref 0.0–0.5)
Eosinophils Relative: 0 %
HCT: 35.4 % — ABNORMAL LOW (ref 39.0–52.0)
Hemoglobin: 9.8 g/dL — ABNORMAL LOW (ref 13.0–17.0)
Immature Granulocytes: 1 %
Lymphocytes Relative: 5 %
Lymphs Abs: 0.5 10*3/uL — ABNORMAL LOW (ref 0.7–4.0)
MCH: 25.2 pg — ABNORMAL LOW (ref 26.0–34.0)
MCHC: 27.7 g/dL — ABNORMAL LOW (ref 30.0–36.0)
MCV: 91 fL (ref 80.0–100.0)
Monocytes Absolute: 0.5 10*3/uL (ref 0.1–1.0)
Monocytes Relative: 5 %
Neutro Abs: 9.6 10*3/uL — ABNORMAL HIGH (ref 1.7–7.7)
Neutrophils Relative %: 89 %
Platelets: 152 10*3/uL (ref 150–400)
RBC: 3.89 MIL/uL — ABNORMAL LOW (ref 4.22–5.81)
RDW: 17 % — ABNORMAL HIGH (ref 11.5–15.5)
WBC: 10.9 10*3/uL — ABNORMAL HIGH (ref 4.0–10.5)
nRBC: 0.2 % (ref 0.0–0.2)

## 2018-11-24 LAB — COMPREHENSIVE METABOLIC PANEL
ALT: 10 U/L (ref 0–44)
AST: 12 U/L — ABNORMAL LOW (ref 15–41)
Albumin: 2.2 g/dL — ABNORMAL LOW (ref 3.5–5.0)
Alkaline Phosphatase: 198 U/L — ABNORMAL HIGH (ref 38–126)
Anion gap: 12 (ref 5–15)
BUN: 32 mg/dL — ABNORMAL HIGH (ref 8–23)
CO2: 26 mmol/L (ref 22–32)
Calcium: 6.9 mg/dL — ABNORMAL LOW (ref 8.9–10.3)
Chloride: 96 mmol/L — ABNORMAL LOW (ref 98–111)
Creatinine, Ser: 3.11 mg/dL — ABNORMAL HIGH (ref 0.61–1.24)
GFR calc Af Amer: 24 mL/min — ABNORMAL LOW (ref >60)
GFR calc non Af Amer: 21 mL/min — ABNORMAL LOW (ref >60)
Glucose, Bld: 162 mg/dL — ABNORMAL HIGH (ref 70–99)
Potassium: 4.2 mmol/L (ref 3.5–5.1)
Sodium: 134 mmol/L — ABNORMAL LOW (ref 135–145)
Total Bilirubin: 1.1 mg/dL (ref 0.3–1.2)
Total Protein: 6.2 g/dL — ABNORMAL LOW (ref 6.5–8.1)

## 2018-11-24 LAB — PROTIME-INR
INR: 1.2 (ref 0.8–1.2)
Prothrombin Time: 15 seconds (ref 11.4–15.2)

## 2018-11-24 LAB — LACTIC ACID, PLASMA: Lactic Acid, Venous: 2.5 mmol/L (ref 0.5–1.9)

## 2018-11-24 LAB — SARS CORONAVIRUS 2 (TAT 6-24 HRS): SARS Coronavirus 2: NEGATIVE

## 2018-11-24 LAB — APTT: aPTT: 32 seconds (ref 24–36)

## 2018-11-24 MED ORDER — ONDANSETRON HCL 4 MG/2ML IJ SOLN
4.0000 mg | Freq: Four times a day (QID) | INTRAMUSCULAR | Status: DC | PRN
  Administered 2018-11-25 – 2018-11-29 (×2): 4 mg via INTRAVENOUS
  Filled 2018-11-24 (×2): qty 2

## 2018-11-24 MED ORDER — ACETAMINOPHEN 650 MG RE SUPP: 650.0000 mg | Freq: Four times a day (QID) | RECTAL | Status: DC | PRN

## 2018-11-24 MED ORDER — SODIUM CHLORIDE 0.9 % IV BOLUS
500.0000 mL | Freq: Once | INTRAVENOUS | Status: AC
  Administered 2018-11-24: 17:00:00 500 mL via INTRAVENOUS

## 2018-11-24 MED ORDER — ACETAMINOPHEN 325 MG PO TABS: 650.0000 mg | ORAL_TABLET | Freq: Four times a day (QID) | ORAL | Status: DC | PRN

## 2018-11-24 MED ORDER — VANCOMYCIN HCL 10 G IV SOLR
2500.0000 mg | Freq: Once | INTRAVENOUS | Status: AC
  Administered 2018-11-24: 21:00:00 2500 mg via INTRAVENOUS
  Filled 2018-11-24: qty 2500

## 2018-11-24 MED ORDER — SODIUM CHLORIDE 0.9 % IV BOLUS: 1000.0000 mL | Freq: Once | INTRAVENOUS | Status: DC

## 2018-11-24 MED ORDER — ONDANSETRON HCL 4 MG PO TABS: 4.0000 mg | ORAL_TABLET | Freq: Four times a day (QID) | ORAL | Status: DC | PRN

## 2018-11-24 MED ORDER — VANCOMYCIN HCL IN DEXTROSE 1-5 GM/200ML-% IV SOLN
1000.0000 mg | INTRAVENOUS | Status: DC
  Administered 2018-11-26 – 2018-12-05 (×5): 1000 mg via INTRAVENOUS
  Filled 2018-11-24 (×6): qty 200

## 2018-11-24 MED ORDER — HEPARIN SODIUM (PORCINE) 5000 UNIT/ML IJ SOLN
5000.0000 [IU] | Freq: Three times a day (TID) | INTRAMUSCULAR | Status: DC
  Administered 2018-11-25 – 2018-12-09 (×40): 5000 [IU] via SUBCUTANEOUS
  Filled 2018-11-24 (×42): qty 1

## 2018-11-24 NOTE — ED Provider Notes (Addendum)
[facility] DEPT Provider Note   CSN: [id number] Arrival date & time: 11/16/2018  1404     History   Chief Complaint Chief Complaint  Patient presents with  . Bed Sore    HPI [name] is a 63 y.o. male.     HPI  63 year old comes in a chief complaint of worsening back pain/sore. Patient has history of ESRD on hemodialysis TTS, status post BKA of the right lower extremity, chronic hypotension on Midodrin, chronic hypoxia on 4 L of oxygen with BiPAP at night.  Patient reports that about 2 weeks ago he started having a rash in the back.  Over time the rash opened up and now the ulcer has gotten larger.  Today it looked extremely bad and was foul-smelling therefore wife sent him to the ER.  Patient denies any nausea, vomiting, fevers, chills, pain.  He is noted to have low blood pressure.  He reports his BP is always low, typically in the 90s during dialysis.  He has no dizziness, lightheadedness, confusion.  Patient reports he has chronic back pain and has fracture in his spine or hip, following up with Dr. Sharol Given.    Past Medical History:  Diagnosis Date  . Anemia   . Anxiety   . AR (allergic rhinitis)   . Arthritis    osteoarthritis  . Blood transfusion   . BPH (benign prostatic hypertrophy)   . Cellulitis of left leg 08/14/2012  . Cholelithiasis   . Chronic kidney disease    hx of kidney stones  . Chronic pain   . Diabetes mellitus    insulin dependent  . Displacement of lumbar intervertebral disc without myelopathy    L4-L5  . Diverticulosis of colon   . DVT (deep venous thrombosis) (HCC)    Lower Extremity  . Embolism and thrombosis of unspecified artery ([place])   . ESRD (end stage renal disease) ([place])   . Gallstone   . History of nephrolithiasis    Bilateral  . Hyperlipidemia   . Hypertension   . Hypertrophy of prostate   . Hypothyroidism   . Impotence of organic origin   . Morbid obesity ([place])   . Nephrolithiasis   .  Non-pressure chronic ulcer of left calf, limited to breakdown of skin ([place]) 02/07/2016  . Osteomyelitis ([place])    left tibia and left knee  . Other lymphedema   . Pancreatitis, acute   . Peripheral vascular disease ([place])   . Personal history of arthritis    Osteoarthritis  . Pyogenic arthritis of left knee joint ([place])   . S/P BKA (below knee amputation) unilateral ([place]) 01/22/2015  . Sleep apnea    uses cpap  . Subacute osteomyelitis of left tibia ([place]) 03/05/2017  . Traumatic closed nondisplaced fracture of neck of femur, left, sequela 03/05/2017  . Unspecified septicemia(038.9) [facility])     Patient Active Problem List   Diagnosis Date Noted  . Pressure injury of skin 03/12/2017  . S/P AKA (above knee amputation) unilateral, left ([place]) 03/09/2017  . Closed fracture of lower end of right femur with routine healing 03/05/2017  . DDD (degenerative disc disease), lumbosacral   . Morbid obesity ()   . Anemia of chronic disease   . Thrombocytopenia ([place])   . Idiopathic chronic venous hypertension of right lower extremity with inflammation   . Lower back pain 12/27/2016  . UTI (urinary tract infection) 12/27/2016  . Hypotension, unspecified 11/24/2015  . Left knee pain 11/24/2015  .  Hypotension 11/24/2015  . Decubitus ulcer of buttock 01/22/2015  . Chronic venous insufficiency 09/08/2014  . Diabetes mellitus, type 2 ([place]) 09/08/2014  . ESRD on dialysis ([place]) 09/08/2014  . Hyperlipidemia 12/23/2013  . Diabetic foot ([place]) 11/20/2013  . Chronic pain 05/21/2013  . Chronic interstitial cystitis 02/06/2013  . UI (urinary incontinence) 10/29/2012  . CKD (chronic kidney disease) stage 3, GFR 30-59 ml/min (HCC) 08/18/2012  . Hyperkalemia 08/04/2012  . Hypothyroidism, acquired 08/04/2012  . OBESITY 09/21/2009  . Essential hypertension 09/21/2009  . DIVERTICULOSIS OF COLON 09/21/2009  . Type 2 diabetes mellitus with diabetic nephropathy, with long-term current use of insulin ([place]) 10/01/2007   . DVT 10/01/2007    Past Surgical History:  Procedure Laterality Date  . AMPUTATION  02/07/2011   Procedure: AMPUTATION BELOW KNEE;  Surgeon: Newt Minion, MD;  Location: [facility];  Service: Orthopedics;  Laterality: Left;  Left Below Knee Amputation  . AMPUTATION Left 03/09/2017   Procedure: LEFT ABOVE KNEE AMPUTATION;  Surgeon: Newt Minion, MD;  Location: [facility];  Service: Orthopedics;  Laterality: Left;  . bilteral hip     replacement & revison  's   . CYSTOSCOPY W/ RETROGRADES Bilateral 10/24/2012   Procedure: CYSTOSCOPY WITH RETROGRADE PYELOGRAM Procedure: Cystoscopy, Bilateral Retrogarde Pyelograms, Bladder Biopsy, Hydrodistension;  Surgeon: Franchot Gallo, MD;  Location: [facility];  Service: Urology;  Laterality: Bilateral;  . KIDNEY SURGERY    . KNEE SURGERY     left knee    . LEG AMPUTATION BELOW KNEE    . REPLACEMENT TOTAL KNEE    . TONSILLECTOMY          Home Medications    Prior to Admission medications   Medication Sig Start Date End Date Taking? Authorizing Provider  acetaminophen (TYLENOL) 500 MG tablet Take 500 mg by mouth every 6 (six) hours as needed for moderate pain.    [provider]  atorvastatin (LIPITOR) 40 MG tablet TAKE 1 TABLET (40 MG TOTAL) BY MOUTH DAILY. Patient taking differently: Take 40 mg by mouth daily.  09/13/15   Brunetta Jeans, PA-C  gabapentin (NEURONTIN) 100 MG capsule TAKE 1 CAPSULE (100 MG TOTAL) BY MOUTH 3 (THREE) TIMES DAILY. Patient taking differently: Take 100 mg by mouth 3 (three) times daily.  12/26/15   Brunetta Jeans, PA-C  HYDROcodone-acetaminophen (NORCO/VICODIN) 5-325 MG tablet Take 1 tablet by mouth every 6 (six) hours as needed (pain). 10/21/18   Newt Minion, MD  insulin aspart (NOVOLOG) 100 UNIT/ML injection Inject 3-10 Units as directed 2 (two) times daily. Sliding scale 100-150 3 units 151-200 4 units 201-250 6 units 251-300 8 units 301-350 10 units 09/27/16   [provider]  Insulin Glargine  (BASAGLAR KWIKPEN) 100 UNIT/ML SOPN Inject 30 Units into the skin at bedtime.    [provider]  levothyroxine (SYNTHROID, LEVOTHROID) 200 MCG tablet Take 1 tablet (200 mcg total) by mouth every morning. Patient taking differently: Take 225 mcg by mouth every morning.  01/10/16   Brunetta Jeans, PA-C  loperamide (IMODIUM A-D) 2 MG tablet Take 2 mg by mouth daily.    [provider]  midodrine (PROAMATINE) 5 MG tablet Take 5 mg by mouth 3 (three) times daily with meals.  06/02/16   [provider]  sevelamer carbonate (RENVELA) 800 MG tablet Take 800-1,600 mg by mouth 3 (three) times daily with meals.     [provider]  traMADol (ULTRAM) 50 MG tablet Take 50 mg by mouth as  needed for pain. 11/08/17   [provider]    Family History Family History  Problem Relation Age of Onset  . Diabetes Mother   . Heart attack Mother   . Stroke Mother   . Colon cancer Father   . Dementia Father   . Heart attack Father   . Arthritis Paternal Grandmother   . Hypertension Other   . Hypothyroidism Other     Social History Social History   Tobacco Use  . Smoking status: Never Smoker  . Smokeless tobacco: Never Used  Substance Use Topics  . Alcohol use: No  . Drug use: No     Allergies   Patient has no known allergies.   Review of Systems Review of Systems  Constitutional: Positive for activity change.  Respiratory: Negative for shortness of breath.   Cardiovascular: Negative for chest pain.  Gastrointestinal: Negative for nausea and vomiting.  Skin: Positive for rash and wound.  Allergic/Immunologic: Negative for immunocompromised state.  All other systems reviewed and are negative.    Physical Exam Updated Vital Signs BP (!) 87/60   Pulse 88   Temp 97.6 F (36.4 C) (Oral)   Resp 18   SpO2 100%   Physical Exam Vitals signs and nursing note reviewed.  Constitutional:      Appearance: He is well-developed.  HENT:     Head:  Normocephalic and atraumatic.  Eyes:     Conjunctiva/sclera: Conjunctivae normal.     Pupils: Pupils are equal, round, and reactive to light.  Neck:     Musculoskeletal: Normal range of motion and neck supple.  Cardiovascular:     Rate and Rhythm: Normal rate and regular rhythm.  Pulmonary:     Effort: Pulmonary effort is normal.     Breath sounds: Normal breath sounds.  Abdominal:     General: Bowel sounds are normal.     Palpations: Abdomen is soft.     Tenderness: There is no abdominal tenderness.  Musculoskeletal:        General: Tenderness present. No deformity.     Comments: Patient has a 9 cm ulcer in the left supra gluteal region.  The lesion is foul-smelling and there is surrounding necrotic skin.  No purulent drainage.  No surrounding erythema.  Skin:    General: Skin is warm.  Neurological:     Mental Status: He is alert and oriented to person, place, and time.      ED Treatments / Results  Labs (all labs ordered are listed, but only abnormal results are displayed) Labs Reviewed  CBC WITH DIFFERENTIAL/PLATELET - Abnormal; Notable for the following components:      Result Value   WBC 10.9 (*)    RBC 3.89 (*)    Hemoglobin 9.8 (*)    HCT 35.4 (*)    MCH 25.2 (*)    MCHC 27.7 (*)    RDW 17.0 (*)    Neutro Abs 9.6 (*)    Lymphs Abs 0.5 (*)    Abs Immature Granulocytes 0.13 (*)    All other components within normal limits  COMPREHENSIVE METABOLIC PANEL - Abnormal; Notable for the following components:   Sodium 134 (*)    Chloride 96 (*)    Glucose, Bld 162 (*)    BUN 32 (*)    Creatinine, Ser 3.11 (*)    Calcium 6.9 (*)    Total Protein 6.2 (*)    Albumin 2.2 (*)    AST 12 (*)    Alkaline  Phosphatase 198 (*)    GFR calc non Af Amer 21 (*)    GFR calc Af Amer 24 (*)    All other components within normal limits  SARS CORONAVIRUS 2 (TAT 6-24 HRS)  PROTIME-INR  APTT  SEDIMENTATION RATE  LACTIC ACID, PLASMA  LACTIC ACID, PLASMA  C-REACTIVE PROTEIN     EKG None  Radiology Dg Chest Port 1 View  Result Date: 11/22/2018 CLINICAL DATA:  Hypoxia. EXAM: PORTABLE CHEST 1 VIEW COMPARISON:  Multiple chest x-rays since March 30, 2018 FINDINGS: No pneumothorax. Stable cardiomegaly. Opacity in the left base is more pronounced in the interval and somewhat platelike. No overt edema. No other acute abnormalities. IMPRESSION: There is opacity in the left base which is more pronounced in the interval. The somewhat platelike appearance suggests the possibility of atelectasis rather than pneumonia. Recommend clinical correlation and short-term follow-up to ensure resolution. Electronically Signed   By: Dorise Bullion III M.D   On: 11/01/2018 15:38    Procedures Procedures (including critical care time)  Medications Ordered in ED Medications  sodium chloride 0.9 % bolus 500 mL (500 mLs Intravenous New Bag/Given 10/31/2018 1702)     Initial Impression / Assessment and Plan / ED Course  I have reviewed the triage vital signs and the nursing notes.  Pertinent labs & imaging results that were available during my care of the patient were reviewed by me and considered in my medical decision making (see chart for details).  Clinical Course as of Nov 23 1808  Sun Nov 24, 2018  1807 White count is 10.9.  Sed rate and CRP pending.  Rest of the labs are at baseline normal for the patient given his renal failure.  WBC(!): 10.9 [AN]  1808 Blood pressure has been up and down. He has not shown any evidence of malperfusion to the brain.  Continues to have no fevers.  I do not think patient needs pressors and it does not appear that there is secondary cause for hypotension such as sepsis.  BP(!): 87/60 [AN]  1808 Corrected calcium is 8.3.  Total Protein(!): 6.2 [AN]    Clinical Course User Index [AN] Varney Biles, MD       63 year old patient with history of diabetes, ESRD, low the knee amputation was bedbound comes in a chief complaint of worsening rash.   He is noted to have a large pressure ulcer that appears to have newly developed and rapidly gotten worse.  Likely patient had an unstageable ulcer for a while that just opened up and is progressing quickly.  The lesion is foul-smelling and he will need debridement of his ulcer.  Of note patient is hypotensive, but he has chronic hypoxia and is on Midodrin.  He denies any systemic symptoms of infection or poor perfusion.  He is chronically on oxygen.  I do not think there is underlying sepsis.  It is unclear why he is on oxygen as there is no known history of lung problems.  He has history of DVT, therefore one possibility is that he could be having a PE, and the PE could be causing hypoxic event.  We will discuss this finding with the admitting service and defer further management with them -there really is no acute change in his respiratory status.   10:02 PM I spoke with Dr. Johney Maine at the request of admitting team. He has requested that the admitting team consult surgical service tomorrow as there is no need for him to emergently see non septic  pressure ulcer. Dr. Jonelle Sidle made aware.  Final Clinical Impressions(s) / ED Diagnoses   Final diagnoses:  Pressure injury of back, stage 4 (HCC)  Chronic respiratory failure with hypoxia (HCC)  Hypotension, unspecified hypotension type    ED Discharge Orders    None       Varney Biles, MD 11/14/2018 [place], Niaya Hickok, MD 11/12/2018 2204

## 2018-11-24 NOTE — Consult Note (Signed)
[name]  Mar 06, 1955 [id number]  Patient Care Team: [place] as PCP - General (Internal Medicine) Orvan July, MD as Consulting Physician (Pulmonary Disease) Caren Macadam, PA-C as Physician Assistant (Wound Care) Newt Minion, MD as Consulting Physician (Orthopedic Surgery) Hermelinda Medicus, MD as Consulting Physician (Rheumatology) Domingo Pulse, MD as Consulting Physician (Urology) Robert Bellow, PA-C as Physician Assistant (Internal Medicine)  This patient is a 63 y.o.male   Reason for call: Decubitus ulcer  Called by Dr. Kathrynn Humble with the [facility] emergency department.  Patient with multiple medical problems.  History of poorly controlled diabetes and hypertension.  Recurrent pyelonephritis seemed secondary to kidney and ureteral stones causing chronic scarring.  Very small bladder size presumed to be interstitial cystitis failing SP tube and further interventions.  Ultimately required cystectomy with urostomy by Dr. Amalia Hailey at [facility].  But progressed from chronic kidney disease to complete end-stage renal disease.  Now dialysis dependent.  Morbidly obese with severe spinal degenerative joint disease.  Very limited mobility and tended to be wheelchair-bound.  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  Gets most of his care at [facility] and seems to bounce between [facility] health and [facility] [facility].  He has other chronic wounds followed by the wound care clinic in [facility].  Gets dialysis Tuesday/Thursday/Saturday.  Claims worsening skin changes on lower back.  Found to have foul-smelling decubitus by family and brought in the [facility] emergency room Sunday night.  There is no leukocytosis or tachycardia.  According to Dr. Kathrynn Humble there is no hard evidence of massive purulent drainage nor crepitus concerning for necrotizing fasciitis.  He is not tachycardic.  Starting IV antibiotics.  Request made for surgical  evaluation & debridement.  Patient will need to be admitted to a place that can provide dialysis for stabilization in this very medically fragile and complicated patient.  Patient now is oxygen dependent and was recently admitted for pulmonary issues.  Want to make sure he is medically stabilized and at some point can consider surgical debridement most likely in the operating room, but possibly could start at the bedside.  There is no hard evidence of necrotizing fasciitis at this time.  Once the patient is admitted stabilized, surgery will formally evaluate in the morning and consider operative debridement tomorrow if medically cleared depending on if patient gets admitted to [facility] main hospital versus goes back to [place].  Keep n.p.o. after midnight for possible surgical debridement tomorrow.  General surgery to evaluate in the morning since there is no evidence of sepsis or shock at this moment  Patient Active Problem List   Diagnosis Date Noted  . End stage renal disease ([place]) 08/18/2012    Priority: Medium  . Decubitus ulcer of sacral region, stage 4 ([place]) 11/05/2018  . Sleep apnea   . Nephrolithiasis   . Impaired mobility and activities of daily living 09/27/2018  . History of noncompliance with medical treatment, presenting hazards to health 09/24/2018  . Acute on chronic respiratory failure with hypoxia and hypercapnia ([place]) 09/23/2018  . History of gastric polyp 02/13/2018  . Sedentary lifestyle 06/13/2017  . Pressure injury of skin 03/12/2017  . S/P AKA (above knee amputation) unilateral, left ([place]) 03/09/2017  . DDD (degenerative disc disease), lumbosacral   . Thrombocytopenia ([place])   . Idiopathic chronic venous hypertension of right lower extremity with inflammation   . Lower back pain 12/27/2016  . UTI (urinary tract infection)  12/27/2016  . Anemia, chronic disease 09/05/2016  . Closed fracture of distal end of right femur ([place]) 09/05/2016  .  Hemodialysis-associated hypotension 11/24/2015  . Left knee pain 11/24/2015  . Hypotension 11/24/2015  . Unstageable pressure ulcer of right buttock ([place]) 10/05/2014  . Chronic venous insufficiency 09/08/2014  . ESRD on dialysis ([place]) 09/08/2014  . Diabetic neuropathy, type II diabetes mellitus ([place]) 09/08/2014  . Hyperlipidemia 12/23/2013  . Diabetic foot ([place]) 11/20/2013  . Class 3 severe obesity with body mass index (BMI) of 40.0 to 44.9 in adult ([place]) 07/04/2013  . Chronic pain 05/21/2013  . Chronic interstitial cystitis 02/06/2013  . UI (urinary incontinence) 10/29/2012  . Acute respiratory failure with hypoxia ([place]) 08/14/2012  . Hyperkalemia 08/04/2012  . Hypothyroidism, acquired 08/04/2012  . OBESITY 09/21/2009  . Essential hypertension 09/21/2009  . DIVERTICULOSIS OF COLON 09/21/2009  . Type 2 diabetes mellitus with diabetic nephropathy, with long-term current use of insulin ([place]) 10/01/2007  . DVT 10/01/2007    Past Medical History:  Diagnosis Date  . Anemia   . Anxiety   . AR (allergic rhinitis)   . Arthritis    osteoarthritis  . Blood transfusion   . BPH (benign prostatic hypertrophy)   . Cellulitis of left leg 08/14/2012  . Cholelithiasis   . Chronic kidney disease    hx of kidney stones  . Chronic pain   . Diabetes mellitus    insulin dependent  . Displacement of lumbar intervertebral disc without myelopathy    L4-L5  . Diverticulosis of colon   . DVT (deep venous thrombosis) (HCC)    Lower Extremity  . Embolism and thrombosis of unspecified artery ([place])   . ESRD (end stage renal disease) ([place])   . Gallstone   . History of nephrolithiasis    Bilateral  . Hyperlipidemia   . Hypertension   . Hypertrophy of prostate   . Hypothyroidism   . Impotence of organic origin   . Morbid obesity ([place])   . Nephrolithiasis   . Non-pressure chronic ulcer of left calf, limited to breakdown of skin ([place]) 02/07/2016  . Osteomyelitis ([place])    left tibia and left knee   . Other lymphedema   . Pancreatitis, acute   . Peripheral vascular disease ([place])   . Personal history of arthritis    Osteoarthritis  . Pyelonephritis 10/29/2012  . Pyogenic arthritis of left knee joint ([place])   . S/P BKA (below knee amputation) unilateral ([place]) 01/22/2015  . Sleep apnea    uses cpap  . Subacute osteomyelitis of left tibia ([place]) 03/05/2017  . Traumatic closed nondisplaced fracture of neck of femur, left, sequela 03/05/2017  . Unspecified septicemia(038.9) [facility])     Past Surgical History:  Procedure Laterality Date  . AMPUTATION  02/07/2011   Procedure: AMPUTATION BELOW KNEE;  Surgeon: Newt Minion, MD;  Location: [place];  Service: Orthopedics;  Laterality: Left;  Left Below Knee Amputation  . AMPUTATION Left 03/09/2017   Procedure: LEFT ABOVE KNEE AMPUTATION;  Surgeon: Newt Minion, MD;  Location: [place];  Service: Orthopedics;  Laterality: Left;  . bilteral hip     replacement & revison  's   . CYSTOSCOPY W/ RETROGRADES Bilateral 10/24/2012   Procedure: CYSTOSCOPY WITH RETROGRADE PYELOGRAM Procedure: Cystoscopy, Bilateral Retrogarde Pyelograms, Bladder Biopsy, Hydrodistension;  Surgeon: Franchot Gallo, MD;  Location: [facility];  Service: Urology;  Laterality: Bilateral;  . KIDNEY SURGERY    . KNEE SURGERY     left knee    .  LEG AMPUTATION BELOW KNEE    . REPLACEMENT TOTAL KNEE    . TONSILLECTOMY      Social History   Socioeconomic History  . Marital status: Married    Spouse name: Not on file  . Number of children: Not on file  . Years of education: Not on file  . Highest education level: Not on file  Occupational History  . Not on file  Social Needs  . Financial resource strain: Not on file  . Food insecurity    Worry: Not on file    Inability: Not on file  . Transportation needs    Medical: Not on file    Non-medical: Not on file  Tobacco Use  . Smoking status: Never Smoker  . Smokeless tobacco: Never Used  Substance and Sexual Activity  . Alcohol  use: No  . Drug use: No  . Sexual activity: Not Currently  Lifestyle  . Physical activity    Days per week: Not on file    Minutes per session: Not on file  . Stress: Not on file  Relationships  . Social Herbalist on phone: Not on file    Gets together: Not on file    Attends religious service: Not on file    Active member of club or organization: Not on file    Attends meetings of clubs or organizations: Not on file    Relationship status: Not on file  . Intimate partner violence    Fear of current or ex partner: Not on file    Emotionally abused: Not on file    Physically abused: Not on file    Forced sexual activity: Not on file  Other Topics Concern  . Not on file  Social History Narrative   Lives with his wife and uses a wheelchair for transfers.      Family History  Problem Relation Age of Onset  . Diabetes Mother   . Heart attack Mother   . Stroke Mother   . Colon cancer Father   . Dementia Father   . Heart attack Father   . Arthritis Paternal Grandmother   . Hypertension Other   . Hypothyroidism Other     Current Facility-Administered Medications  Medication Dose Route Frequency Provider Last Rate Last Dose  . vancomycin (VANCOCIN) 2,500 mg in sodium chloride 0.9 % 500 mL IVPB  2,500 mg Intravenous Once Eudelia Bunch, RPH      . [START ON 11/26/2018] vancomycin (VANCOCIN) IVPB 1000 mg/200 mL premix  1,000 mg Intravenous Q T,Th,Sa-HD Eudelia Bunch, [facility]       Current Outpatient Medications  Medication Sig Dispense Refill  . acetaminophen (TYLENOL) 500 MG tablet Take 500 mg by mouth every 6 (six) hours as needed for moderate pain.    Marland Kitchen atorvastatin (LIPITOR) 40 MG tablet TAKE 1 TABLET (40 MG TOTAL) BY MOUTH DAILY. (Patient taking differently: Take 40 mg by mouth daily. ) 90 tablet 1  . gabapentin (NEURONTIN) 100 MG capsule TAKE 1 CAPSULE (100 MG TOTAL) BY MOUTH 3 (THREE) TIMES DAILY. (Patient taking differently: Take 100 mg by mouth 3 (three)  times daily. ) 90 capsule 1  . HYDROcodone-acetaminophen (NORCO/VICODIN) 5-325 MG tablet Take 1 tablet by mouth every 6 (six) hours as needed (pain). 28 tablet 0  . insulin aspart (NOVOLOG) 100 UNIT/ML injection Inject 3-10 Units as directed 2 (two) times daily. Sliding scale 100-150 3 units 151-200 4 units 201-250 6 units 251-300 8 units 301-350  10 units    . Insulin Glargine (BASAGLAR KWIKPEN) 100 UNIT/ML SOPN Inject 30 Units into the skin at bedtime.    Marland Kitchen levothyroxine (SYNTHROID, LEVOTHROID) 200 MCG tablet Take 1 tablet (200 mcg total) by mouth every morning. (Patient taking differently: Take 225 mcg by mouth every morning. ) 90 tablet 0  . loperamide (IMODIUM A-D) 2 MG tablet Take 2 mg by mouth daily.    . midodrine (PROAMATINE) 5 MG tablet Take 5 mg by mouth 3 (three) times daily with meals.     . sevelamer carbonate (RENVELA) 800 MG tablet Take 800-1,600 mg by mouth 3 (three) times daily with meals.     . traMADol (ULTRAM) 50 MG tablet Take 50 mg by mouth as needed for pain.  2     No Known Allergies  BP 90/69   Pulse 87   Temp 97.6 F (36.4 C) (Oral)   Resp 18   Wt 113.4 kg   SpO2 95%   BMI 33.91 kg/m   Dg Chest Port 1 View  Result Date: 11/19/2018 CLINICAL DATA:  Hypoxia. EXAM: PORTABLE CHEST 1 VIEW COMPARISON:  Multiple chest x-rays since March 30, 2018 FINDINGS: No pneumothorax. Stable cardiomegaly. Opacity in the left base is more pronounced in the interval and somewhat platelike. No overt edema. No other acute abnormalities. IMPRESSION: There is opacity in the left base which is more pronounced in the interval. The somewhat platelike appearance suggests the possibility of atelectasis rather than pneumonia. Recommend clinical correlation and short-term follow-up to ensure resolution. Electronically Signed   By: Dorise Bullion III M.D   On: 11/01/2018 15:38    Note: This dictation was prepared with Dragon/digital dictation along with [facility] technology. Any  transcriptional errors that result from this process are unintentional.   .Adin Hector, M.D., F.A.C.S. Gastrointestinal and Minimally Invasive Surgery [facility] [address], [place], [place] [address] [phone] Main / Paging  11/26/2018 7:51 PM

## 2018-11-24 NOTE — ED Triage Notes (Signed)
EMS reports from home, Pt c/o progressively worsening bed sore for past two weeks. Pt normally on 4lts O2 and Bi-Pap at night.   BP 96/60 HR 94 RR 19 Sp02 100 @ 4 lts CBG 230 Temp 97.0

## 2018-11-24 NOTE — ED Notes (Signed)
CRITICAL VALUE STICKER  CRITICAL VALUE: Lactic 2.5  RECEIVER (on-site recipient of call): Antonyo Hinderer RN   [place] NOTIFIED: 11/08/2018 2100  MESSENGER (representative from lab):   MD NOTIFIED:  Garba MD  TIME OF NOTIFICATION: 2101

## 2018-11-24 NOTE — ED Notes (Signed)
ED TO INPATIENT HANDOFF REPORT  Name/Age/Gender [name] 63 y.o. male  Code Status Code Status History    Date Active Date Inactive Code Status Order ID Comments User Context   03/09/2017 1432 03/14/2017 2002 Full Code [id number]  Newt Minion, MD Inpatient   12/27/2016 1938 01/05/2017 1719 Full Code [id number]  Ivor Costa, MD ED   11/24/2015 0607 12/01/2015 0404 Full Code [id number]  Danford, Suann Larry, MD ED   10/29/2012 2225 11/04/2012 1953 Full Code [id number]  Janece Canterbury, MD Inpatient   08/14/2012 0126 08/21/2012 2148 Full Code [id number]  Juanito Doom, MD Inpatient   08/04/2012 2015 08/07/2012 2037 Full Code [id number]  Bonnielee Haff, MD Inpatient   02/07/2011 2159 02/10/2011 2012 Full Code [id number]  Imelda Pillow, RN Inpatient   02/01/2011 1702 02/07/2011 2159 Full Code [id number]  Pilar Plate, RN Inpatient   Advance Care Planning Activity      Home/SNF/Other Home  Chief Complaint bed sores  Level of Care/Admitting Diagnosis ED Disposition    ED Disposition [facility] Area: [facility] [[id number]]  Level of Care: Med-Surg [16]  Covid Evaluation: Asymptomatic Screening Protocol (No Symptoms)  Diagnosis: Decubitus ulcer of sacral region, stage 4 [facility]) [id number]  Admitting Physician: Elwyn Reach [[id number]]  Attending Physician: Elwyn Reach [[id number]]  Estimated length of stay: past midnight tomorrow  Certification:: I certify this patient will need inpatient services for at least 2 midnights  PT Class (Do Not Modify): Inpatient [101]  PT Acc Code (Do Not Modify): Private [1]       Medical History Past Medical History:  Diagnosis Date  . Anemia   . Anxiety   . AR (allergic rhinitis)   . Arthritis    osteoarthritis  . Blood transfusion   . BPH (benign prostatic hypertrophy)   . Cellulitis of left leg 08/14/2012  . Cholelithiasis   . Chronic kidney disease    hx of kidney stones  . Chronic pain    . Diabetes mellitus    insulin dependent  . Displacement of lumbar intervertebral disc without myelopathy    L4-L5  . Diverticulosis of colon   . DVT (deep venous thrombosis) (HCC)    Lower Extremity  . Embolism and thrombosis of unspecified artery ([place])   . ESRD (end stage renal disease) ([place])   . Gallstone   . History of nephrolithiasis    Bilateral  . Hyperlipidemia   . Hypertension   . Hypertrophy of prostate   . Hypothyroidism   . Impotence of organic origin   . Morbid obesity ([place])   . Nephrolithiasis   . Non-pressure chronic ulcer of left calf, limited to breakdown of skin ([place]) 02/07/2016  . Osteomyelitis ([place])    left tibia and left knee  . Other lymphedema   . Pancreatitis, acute   . Peripheral vascular disease ([place])   . Personal history of arthritis    Osteoarthritis  . Pyogenic arthritis of left knee joint ([place])   . S/P BKA (below knee amputation) unilateral ([place]) 01/22/2015  . Sleep apnea    uses cpap  . Subacute osteomyelitis of left tibia ([place]) 03/05/2017  . Traumatic closed nondisplaced fracture of neck of femur, left, sequela 03/05/2017  . Unspecified septicemia(038.9) ([place])     Allergies No Known Allergies  IV Location/Drains/Wounds Patient Lines/Drains/Airways Status   Active Line/Drains/Airways    Name:   Placement date:   Placement time:   Site:  Days:   Peripheral IV 10/31/2018 Left Hand   10/30/2018    1657    Hand   less than 1   Fistula / Graft Right Upper arm Arteriovenous fistula   -    -    Upper arm      Negative Pressure Wound Therapy Leg Left   03/09/17    0757    -   625   Urostomy RLQ   -    -    RLQ      Incision (Closed) 03/09/17 Leg Left   03/09/17    0759     625   Pressure Ulcer 08/19/12 Deep Tissue Injury - Purple or maroon localized area of discolored intact skin or blood-filled blister due to damage of underlying soft tissue from pressure and/or shear. dark pink, non blanchable ? dti vs stage one, painful to to   08/19/12    1000      2288   Pressure Injury 11/29/15 Stage II -  Partial thickness loss of dermis presenting as a shallow open ulcer with a red, pink wound bed without slough.   11/29/15    2230     1091   Pressure Injury 11/29/15 Stage I -  Intact skin with non-blanchable redness of a localized area usually over a bony prominence.   11/29/15    2230     1091   Pressure Injury 03/09/17 Stage I -  Intact skin with non-blanchable redness of a localized area usually over a bony prominence. skin pink, dry skin peeling, blanchable   03/09/17    0653     625   Wound 02/01/11 Other (Comment) Leg Left weaping cellulitis   02/01/11    1657    Leg   2853   Wound 08/19/12 Other (Comment) Leg Left open red and whitish area on left stump   08/19/12    1100    Leg   2288   Wound 10/29/12 Other (Comment) Leg Right;Lower red, edematous RLL one scab 1x1cm   10/29/12    2235    Leg   2217          Labs/Imaging Results for orders placed or performed during the hospital encounter of 11/27/2018 (from the past 48 hour(s))  CBC with Differential     Status: Abnormal   Collection Time: 11/23/2018  5:22 PM  Result Value Ref Range   WBC 10.9 (H) 4.0 - 10.5 K/uL   RBC 3.89 (L) 4.22 - 5.81 MIL/uL   Hemoglobin 9.8 (L) 13.0 - 17.0 g/dL   HCT 35.4 (L) 39.0 - 52.0 %   MCV 91.0 80.0 - 100.0 fL   MCH 25.2 (L) 26.0 - 34.0 pg   MCHC 27.7 (L) 30.0 - 36.0 g/dL   RDW 17.0 (H) 11.5 - 15.5 %   Platelets 152 150 - 400 K/uL   nRBC 0.2 0.0 - 0.2 %   Neutrophils Relative % 89 %   Neutro Abs 9.6 (H) 1.7 - 7.7 K/uL   Lymphocytes Relative 5 %   Lymphs Abs 0.5 (L) 0.7 - 4.0 K/uL   Monocytes Relative 5 %   Monocytes Absolute 0.5 0.1 - 1.0 K/uL   Eosinophils Relative 0 %   Eosinophils Absolute 0.0 0.0 - 0.5 K/uL   Basophils Relative 0 %   Basophils Absolute 0.0 0.0 - 0.1 K/uL   Immature Granulocytes 1 %   Abs Immature Granulocytes 0.13 (H) 0.00 - 0.07 K/uL    Comment: Performed  at [facility], [place] [address] [place], [place]  [address]  Comprehensive metabolic panel     Status: Abnormal   Collection Time: 11/19/2018  5:22 PM  Result Value Ref Range   Sodium 134 (L) 135 - 145 mmol/L   Potassium 4.2 3.5 - 5.1 mmol/L   Chloride 96 (L) 98 - 111 mmol/L   CO2 26 22 - 32 mmol/L   Glucose, Bld 162 (H) 70 - 99 mg/dL   BUN 32 (H) 8 - 23 mg/dL   Creatinine, Ser 3.11 (H) 0.61 - 1.24 mg/dL   Calcium 6.9 (L) 8.9 - 10.3 mg/dL   Total Protein 6.2 (L) 6.5 - 8.1 g/dL   Albumin 2.2 (L) 3.5 - 5.0 g/dL   AST 12 (L) 15 - 41 U/L   ALT 10 0 - 44 U/L   Alkaline Phosphatase 198 (H) 38 - 126 U/L   Total Bilirubin 1.1 0.3 - 1.2 mg/dL   GFR calc non Af Amer 21 (L) >60 mL/min   GFR calc Af Amer 24 (L) >60 mL/min   Anion gap 12 5 - 15    Comment: Performed at [facility], [place] [address], [place], [place] [address]  Protime-INR     Status: None   Collection Time: 10/31/2018  5:22 PM  Result Value Ref Range   Prothrombin Time 15.0 11.4 - 15.2 seconds   INR 1.2 0.8 - 1.2    Comment: (NOTE) INR goal varies based on device and disease states. Performed at [facility], [place] [address], [place], [place] [address]   APTT     Status: None   Collection Time: 11/08/2018  5:22 PM  Result Value Ref Range   aPTT 32 24 - 36 seconds    Comment: Performed at [facility], [place] [address] [place], [place] [address]  Sedimentation rate     Status: Abnormal   Collection Time: 11/21/2018  5:22 PM  Result Value Ref Range   Sed Rate 31 (H) 0 - 16 mm/hr    Comment: Performed at [facility], [place] [address] [place], [place] [address]   Dg Chest Port 1 View  Result Date: 10/29/2018 CLINICAL DATA:  Hypoxia. EXAM: PORTABLE CHEST 1 VIEW COMPARISON:  Multiple chest x-rays since March 30, 2018 FINDINGS: No pneumothorax. Stable cardiomegaly. Opacity in the left base is more pronounced in the interval and somewhat platelike. No overt edema. No other acute abnormalities. IMPRESSION: There  is opacity in the left base which is more pronounced in the interval. The somewhat platelike appearance suggests the possibility of atelectasis rather than pneumonia. Recommend clinical correlation and short-term follow-up to ensure resolution. Electronically Signed   By: Dorise Bullion III M.D   On: 11/16/2018 15:38    Pending Labs Unresulted Labs (From admission, onward)    Start     Ordered   10/31/2018 1447  SARS CORONAVIRUS 2 (TAT 6-24 HRS) Nasopharyngeal Nasopharyngeal Swab  (Asymptomatic/Tier 2 Patients Labs)  Once,   STAT    Question Answer Comment  Is this test for diagnosis or screening Screening   Symptomatic for COVID-19 as defined by CDC No   Hospitalized for COVID-19 No   Admitted to ICU for COVID-19 No   Previously tested for COVID-19 No   Resident in a congregate (group) care setting No   Employed in healthcare setting No      11/09/2018 1446   10/29/2018 1446  Lactic acid, plasma  Now then  every 2 hours,   STAT     11/19/2018 1445   11/10/2018 1446  C-reactive protein  Once,   STAT     11/18/2018 1445   Signed and Held  HIV Antibody  (Routine Testing)  Once,   R     Signed and Held   Signed and Held  CBC  (heparin)  Once,   R    Comments: Baseline for heparin therapy IF NOT ALREADY DRAWN.  Notify MD if PLT < 100 K.    Signed and Held   Signed and Held  Creatinine, serum  (heparin)  Once,   R    Comments: Baseline for heparin therapy IF NOT ALREADY DRAWN.    Signed and Held   Signed and Held  Comprehensive metabolic panel  Tomorrow morning,   R     Signed and Held   Signed and Held  CBC  Tomorrow morning,   R     Signed and Held          Vitals/Pain Today's Vitals   10/29/2018 1756 11/23/2018 1831 11/17/2018 1900 11/06/2018 1905  BP:  (!) 91/59 90/69   Pulse: 88 90 87   Resp: 18 18 18    Temp:      TempSrc:      SpO2: 100% 98% 95%   Weight:    113.4 kg  PainSc:        Isolation Precautions No active isolations  Medications Medications  vancomycin (VANCOCIN) 2,500  mg in sodium chloride 0.9 % 500 mL IVPB (has no administration in time range)  vancomycin (VANCOCIN) IVPB 1000 mg/200 mL premix (has no administration in time range)  sodium chloride 0.9 % bolus 500 mL (0 mLs Intravenous Stopped 11/04/2018 1830)    Mobility non-ambulatory

## 2018-11-24 NOTE — Progress Notes (Signed)
Pharmacy Antibiotic Note  [name] is a 63 y.o. male admitted on 11/18/2018 with a bed sore wound infection.  Pharmacy has been consulted for vancomycin dosing. Pt states his dry weight is 250 pounds and he does not recall getting any vancomycin at his HD center.   ESRD on HD TTS.  Plan: Vancomyin 2500 mg IV loading dose then vancomycin 1000 mg after each HD on TTS F/u HD schedule F/u WBC, temp, clinical course  Weight: 250 lb (113.4 kg)  Temp (24hrs), Avg:97.6 F (36.4 C), Min:97.6 F (36.4 C), Max:97.6 F (36.4 C)  Recent Labs  Lab 11/26/2018 1722  WBC 10.9*  CREATININE 3.11*    CrCl cannot be calculated (Unknown ideal weight.).    No Known Allergies  Antimicrobials this admission: 9/27 Vanc>>  Dose adjustments this admission:  Microbiology results:  Thank you for allowing pharmacy to be a part of this patient's care.  Eudelia Bunch, Pharm.D [phone] 11/14/2018 7:15 PM

## 2018-11-24 NOTE — H&P (Signed)
History and Physical   MARKHAM STRAHL [id number] DOB: 1955-08-24 DOA: 10/31/2018  Referring MD/NP/PA: Dr. Kathrynn Humble  PCP: [facility]   Outpatient Specialists: Dr. Lorin [facility] orthopedics  Patient coming from: Home  Chief Complaint: Decubitus ulcer  HPI: [name] is a 63 y.o. male with medical history significant of end-stage renal disease on hemodialysis TTS, peripheral vascular disease status post left AKA, right femoral fracture, diabetes, morbid obesity, hypertension, hyperlipidemia presenting to the ER with ulcer in his sacral.  Patient is largely wheelchair and bedbound.  He also spends at least 4 hours at dialysis 3 times a week therefore putting a lot of pressure on his sacral area.  He developed a small ulcer apparently about 4 weeks ago which has now progressed to stage IV sacral decubitus ulcer.  He came in with a pain as well as discharge from the area.  Patient was seen in the ER and evaluated and help to have what appears to be infected decubitus ulcer.  He has some eschar over it.  Has not used anything prior to now.  Denied any fever or chills no nausea vomiting or diarrhea.  Patient is being admitted for management of his decubitus ulcer which may require debridement and aggressive wound care..  ED Course: temperature is 97.6 blood pressure 80/56 pulse 93 respirate of 18 oxygen sats 88% on room air.  Sodium 134 potassium 4.2 chloride 96 CO2 26 glucose 162.  BUN 30 creatinine 3.11 and calcium 6.9.  White count 10.9 hemoglobin 9.8 and platelet count of 152.  Lactic acid 2.5.  PT 15.0 INR 1.2 sed rate of 31.  COVID-19 screen negative.  Chest x-ray showed no acute finding.  Patient is being admitted for treatment of his decubitus ulcer  Review of Systems: As per HPI otherwise 10 point review of systems negative.    Past Medical History:  Diagnosis Date   Anemia    Anxiety    AR (allergic rhinitis)    Arthritis    osteoarthritis   Blood transfusion      BPH (benign prostatic hypertrophy)    Cellulitis of left leg 08/14/2012   Cholelithiasis    Chronic kidney disease    hx of kidney stones   Chronic pain    Diabetes mellitus    insulin dependent   Displacement of lumbar intervertebral disc without myelopathy    L4-L5   Diverticulosis of colon    DVT (deep venous thrombosis) (HCC)    Lower Extremity   Embolism and thrombosis of unspecified artery (HCC)    ESRD (end stage renal disease) ([place])    Gallstone    History of nephrolithiasis    Bilateral   Hyperlipidemia    Hypertension    Hypertrophy of prostate    Hypothyroidism    Impotence of organic origin    Morbid obesity ([place])    Nephrolithiasis    Non-pressure chronic ulcer of left calf, limited to breakdown of skin ([place]) 02/07/2016   Osteomyelitis ([place])    left tibia and left knee   Other lymphedema    Pancreatitis, acute    Peripheral vascular disease ([place])    Personal history of arthritis    Osteoarthritis   Pyelonephritis 10/29/2012   Pyogenic arthritis of left knee joint (HCC)    S/P BKA (below knee amputation) unilateral ([place]) 01/22/2015   Sleep apnea    uses cpap   Subacute osteomyelitis of left tibia ([place]) 03/05/2017   Traumatic closed nondisplaced fracture of neck of  femur, left, sequela 03/05/2017   Unspecified septicemia(038.9) [facility])     Past Surgical History:  Procedure Laterality Date   AMPUTATION  02/07/2011   Procedure: AMPUTATION BELOW KNEE;  Surgeon: Newt Minion, MD;  Location: [facility];  Service: Orthopedics;  Laterality: Left;  Left Below Knee Amputation   AMPUTATION Left 03/09/2017   Procedure: LEFT ABOVE KNEE AMPUTATION;  Surgeon: Newt Minion, MD;  Location: [facility];  Service: Orthopedics;  Laterality: Left;   bilteral hip     replacement & revison  's    CYSTOSCOPY W/ RETROGRADES Bilateral 10/24/2012   Procedure: CYSTOSCOPY WITH RETROGRADE PYELOGRAM Procedure: Cystoscopy, Bilateral Retrogarde Pyelograms, Bladder  Biopsy, Hydrodistension;  Surgeon: Franchot Gallo, MD;  Location: [facility];  Service: Urology;  Laterality: Bilateral;   KIDNEY SURGERY     KNEE SURGERY     left knee     LEG AMPUTATION BELOW KNEE     REPLACEMENT TOTAL KNEE     TONSILLECTOMY       reports that he has never smoked. He has never used smokeless tobacco. He reports that he does not drink alcohol or use drugs.  No Known Allergies  Family History  Problem Relation Age of Onset   Diabetes Mother    Heart attack Mother    Stroke Mother    Colon cancer Father    Dementia Father    Heart attack Father    Arthritis Paternal Grandmother    Hypertension Other    Hypothyroidism Other      Prior to Admission medications   Medication Sig Start Date End Date Taking? Authorizing Provider  acetaminophen (TYLENOL) 500 MG tablet Take 500 mg by mouth every 6 (six) hours as needed for moderate pain.    [provider]  atorvastatin (LIPITOR) 40 MG tablet TAKE 1 TABLET (40 MG TOTAL) BY MOUTH DAILY. Patient taking differently: Take 40 mg by mouth daily.  09/13/15   Brunetta Jeans, PA-C  gabapentin (NEURONTIN) 100 MG capsule TAKE 1 CAPSULE (100 MG TOTAL) BY MOUTH 3 (THREE) TIMES DAILY. Patient taking differently: Take 100 mg by mouth 3 (three) times daily.  12/26/15   Brunetta Jeans, PA-C  HYDROcodone-acetaminophen (NORCO/VICODIN) 5-325 MG tablet Take 1 tablet by mouth every 6 (six) hours as needed (pain). 10/21/18   Newt Minion, MD  insulin aspart (NOVOLOG) 100 UNIT/ML injection Inject 3-10 Units as directed 2 (two) times daily. Sliding scale 100-150 3 units 151-200 4 units 201-250 6 units 251-300 8 units 301-350 10 units 09/27/16   [provider]  Insulin Glargine (BASAGLAR KWIKPEN) 100 UNIT/ML SOPN Inject 30 Units into the skin at bedtime.    [provider]  levothyroxine (SYNTHROID, LEVOTHROID) 200 MCG tablet Take 1 tablet (200 mcg total) by mouth every morning. Patient taking  differently: Take 225 mcg by mouth every morning.  01/10/16   Brunetta Jeans, PA-C  loperamide (IMODIUM A-D) 2 MG tablet Take 2 mg by mouth daily.    [provider]  midodrine (PROAMATINE) 5 MG tablet Take 5 mg by mouth 3 (three) times daily with meals.  06/02/16   [provider]  sevelamer carbonate (RENVELA) 800 MG tablet Take 800-1,600 mg by mouth 3 (three) times daily with meals.     [provider]  traMADol (ULTRAM) 50 MG tablet Take 50 mg by mouth as needed for pain. 11/08/17   [provider]    Physical Exam: Vitals:   11/13/2018 1831 11/21/2018 1900 11/02/2018 1905 11/10/2018  2100  BP: (!) 91/59 90/69  (!) 89/61  Pulse: 90 87  86  Resp: 18 18  18   Temp:      TempSrc:      SpO2: 98% 95%  98%  Weight:   113.4 kg       Constitutional: Morbidly obese with no acute distress Vitals:   11/13/2018 1831 11/18/2018 1900 11/25/2018 1905 11/03/2018 2100  BP: (!) 91/59 90/69  (!) 89/61  Pulse: 90 87  86  Resp: 18 18  18   Temp:      TempSrc:      SpO2: 98% 95%  98%  Weight:   113.4 kg    Eyes: PERRL, lids and conjunctivae normal ENMT: Mucous membranes are moist. Posterior pharynx clear of any exudate or lesions.Normal dentition.  Neck: normal, supple, no masses, no thyromegaly Respiratory: clear to auscultation bilaterally, no wheezing, no crackles. Normal respiratory effort. No accessory muscle use.  Cardiovascular: Regular rate and rhythm, no murmurs / rubs / gallops. No extremity edema. 2+ pedal pulses. No carotid bruits.  Abdomen: no tenderness, no masses palpated. No hepatosplenomegaly. Bowel sounds positive.  Stage IV sacral decubitus ulcer, eschar with mild purulent discharge Musculoskeletal: no clubbing / cyanosis. No joint deformity upper and lower extremities.  Status post left AKA, right lower extremity laterally rotated swollen. Skin: no rashes, lesions, ulcers. No induration Neurologic: CN 2-12 grossly intact. Sensation intact, DTR normal.  Strength 5/5 in all 4.  Psychiatric: Normal judgment and insight. Alert and oriented x 3. Normal mood.     Labs on Admission: I have personally reviewed following labs and imaging studies  CBC: Recent Labs  Lab 11/26/2018 1722  WBC 10.9*  NEUTROABS 9.6*  HGB 9.8*  HCT 35.4*  MCV 91.0  PLT 0000000   Basic Metabolic Panel: Recent Labs  Lab 11/10/2018 1722  NA 134*  K 4.2  CL 96*  CO2 26  GLUCOSE 162*  BUN 32*  CREATININE 3.11*  CALCIUM 6.9*   GFR: CrCl cannot be calculated (Unknown ideal weight.). Liver Function Tests: Recent Labs  Lab 11/15/2018 1722  AST 12*  ALT 10  ALKPHOS 198*  BILITOT 1.1  PROT 6.2*  ALBUMIN 2.2*   No results for input(s): LIPASE, AMYLASE in the last 168 hours. No results for input(s): AMMONIA in the last 168 hours. Coagulation Profile: Recent Labs  Lab 11/09/2018 1722  INR 1.2   Cardiac Enzymes: No results for input(s): CKTOTAL, CKMB, CKMBINDEX, TROPONINI in the last 168 hours. BNP (last 3 results) No results for input(s): PROBNP in the last 8760 hours. HbA1C: No results for input(s): HGBA1C in the last 72 hours. CBG: No results for input(s): GLUCAP in the last 168 hours. Lipid Profile: No results for input(s): CHOL, HDL, LDLCALC, TRIG, CHOLHDL, LDLDIRECT in the last 72 hours. Thyroid Function Tests: No results for input(s): TSH, T4TOTAL, FREET4, T3FREE, THYROIDAB in the last 72 hours. Anemia Panel: No results for input(s): VITAMINB12, FOLATE, FERRITIN, TIBC, IRON, RETICCTPCT in the last 72 hours. Urine analysis:    Component Value Date/Time   COLORURINE AMBER (A) 12/27/2016 1601   APPEARANCEUR TURBID (A) 12/27/2016 1601   LABSPEC >1.046 (H) 12/27/2016 1601   PHURINE 5.0 12/27/2016 1601   GLUCOSEU NEGATIVE 12/27/2016 1601   HGBUR NEGATIVE 12/27/2016 1601   BILIRUBINUR NEGATIVE 12/27/2016 1601   BILIRUBINUR neg 08/10/2014 [place] 12/27/2016 1601   PROTEINUR NEGATIVE 12/27/2016 1601   UROBILINOGEN 1.0 08/10/2014  1637   UROBILINOGEN 0.2 08/03/2014 1225  NITRITE NEGATIVE 12/27/2016 1601   LEUKOCYTESUR TRACE (A) 12/27/2016 1601   Sepsis Labs: @LABRCNTIP (procalcitonin:4,lacticidven:4) )No results found for this or any previous visit (from the past 240 hour(s)).   Radiological Exams on Admission: Dg Chest Port 1 View  Result Date: 11/23/2018 CLINICAL DATA:  Hypoxia. EXAM: PORTABLE CHEST 1 VIEW COMPARISON:  Multiple chest x-rays since March 30, 2018 FINDINGS: No pneumothorax. Stable cardiomegaly. Opacity in the left base is more pronounced in the interval and somewhat platelike. No overt edema. No other acute abnormalities. IMPRESSION: There is opacity in the left base which is more pronounced in the interval. The somewhat platelike appearance suggests the possibility of atelectasis rather than pneumonia. Recommend clinical correlation and short-term follow-up to ensure resolution. Electronically Signed   By: Dorise Bullion III M.D   On: 11/08/2018 15:38      Assessment/Plan Active Problems:   Type 2 diabetes mellitus with diabetic nephropathy, with long-term current use of insulin ([place])   OBESITY   Essential hypertension   End stage renal disease (HCC)   Chronic venous insufficiency   Unstageable pressure ulcer of right buttock (HCC)   Closed fracture of distal end of right femur (HCC)   S/P AKA (above knee amputation) unilateral, left (HCC)   Decubitus ulcer of sacral region, stage 4 ([place])     #1 stage IV sacral decubitus ulcer: Appears infected.  Patient will be admitted.  Surgical consultation in the morning.  Dr. gross of general surgery was consulted here at [facility] but asked that the surgeon in the morning be consulted.  Will initiate IV antibiotics.  Wound care will also be initiated.  Follow surgical recommendations  #2 end-stage renal disease: On hemodialysis TTS nephrology consulted  #3 essential hypertension: Continue blood pressure medications  #4 closed fracture  of the distal end of the femur: Being followed by Dr. Lorin Mercy the orthopedic surgeon in the outpatient setting  #5 morbid obesity: Continue close monitoring usually bedbound  #6 diabetes: Initiate sliding scale insulin   DVT prophylaxis: Heparin Code Status: Full code Family Communication: Family not available Disposition Plan: To be determined Consults called: Nephrology Dr. Jonnie Finner Admission status: Inpatient  Severity of Illness: The appropriate patient status for this patient is INPATIENT. Inpatient status is judged to be reasonable and necessary in order to provide the required intensity of service to ensure the patient's safety. The patient's presenting symptoms, physical exam findings, and initial radiographic and laboratory data in the context of their chronic comorbidities is felt to place them at high risk for further clinical deterioration. Furthermore, it is not anticipated that the patient will be medically stable for discharge from the hospital within 2 midnights of admission. The following factors support the patient status of inpatient.   " The patient's presenting symptoms include ulcer in the sacral region. " The worrisome physical exam findings include sacral decubitus ulcer. " The initial radiographic and laboratory data are worrisome because of leukocytosis. " The chronic co-morbidities include diabetes with end-stage renal disease.   * I certify that at the point of admission it is my clinical judgment that the patient will require inpatient hospital care spanning beyond 2 midnights from the point of admission due to high intensity of service, high risk for further deterioration and high frequency of surveillance required.Barbette Merino MD [facility] Pager [phone]  If 7PM-7AM, please contact night-coverage [email or web address] Password [facility]  11/22/2018, 9:47 PM

## 2018-11-25 DIAGNOSIS — E1121 Type 2 diabetes mellitus with diabetic nephropathy: Secondary | ICD-10-CM

## 2018-11-25 DIAGNOSIS — Z794 Long term (current) use of insulin: Secondary | ICD-10-CM

## 2018-11-25 LAB — CBC
HCT: 38.1 % — ABNORMAL LOW (ref 39.0–52.0)
Hemoglobin: 10.3 g/dL — ABNORMAL LOW (ref 13.0–17.0)
MCH: 24.9 pg — ABNORMAL LOW (ref 26.0–34.0)
MCHC: 27 g/dL — ABNORMAL LOW (ref 30.0–36.0)
MCV: 92.3 fL (ref 80.0–100.0)
Platelets: 133 10*3/uL — ABNORMAL LOW (ref 150–400)
RBC: 4.13 MIL/uL — ABNORMAL LOW (ref 4.22–5.81)
RDW: 17.1 % — ABNORMAL HIGH (ref 11.5–15.5)
WBC: 11.2 10*3/uL — ABNORMAL HIGH (ref 4.0–10.5)
nRBC: 0.4 % — ABNORMAL HIGH (ref 0.0–0.2)

## 2018-11-25 LAB — COMPREHENSIVE METABOLIC PANEL
ALT: 11 U/L (ref 0–44)
AST: 12 U/L — ABNORMAL LOW (ref 15–41)
Albumin: 2.3 g/dL — ABNORMAL LOW (ref 3.5–5.0)
Alkaline Phosphatase: 206 U/L — ABNORMAL HIGH (ref 38–126)
Anion gap: 15 (ref 5–15)
BUN: 41 mg/dL — ABNORMAL HIGH (ref 8–23)
CO2: 22 mmol/L (ref 22–32)
Calcium: 7 mg/dL — ABNORMAL LOW (ref 8.9–10.3)
Chloride: 95 mmol/L — ABNORMAL LOW (ref 98–111)
Creatinine, Ser: 3.34 mg/dL — ABNORMAL HIGH (ref 0.61–1.24)
GFR calc Af Amer: 22 mL/min — ABNORMAL LOW (ref >60)
GFR calc non Af Amer: 19 mL/min — ABNORMAL LOW (ref >60)
Glucose, Bld: 133 mg/dL — ABNORMAL HIGH (ref 70–99)
Potassium: 4.5 mmol/L (ref 3.5–5.1)
Sodium: 132 mmol/L — ABNORMAL LOW (ref 135–145)
Total Bilirubin: 1.2 mg/dL (ref 0.3–1.2)
Total Protein: 6.4 g/dL — ABNORMAL LOW (ref 6.5–8.1)

## 2018-11-25 LAB — LACTIC ACID, PLASMA: Lactic Acid, Venous: 2.3 mmol/L (ref 0.5–1.9)

## 2018-11-25 LAB — GLUCOSE, CAPILLARY
Glucose-Capillary: 209 mg/dL — ABNORMAL HIGH (ref 70–99)
Glucose-Capillary: 152 mg/dL — ABNORMAL HIGH (ref 70–99)

## 2018-11-25 LAB — C-REACTIVE PROTEIN: CRP: 21.9 mg/dL — ABNORMAL HIGH (ref <1.0)

## 2018-11-25 MED ORDER — INSULIN GLARGINE 100 UNIT/ML ~~LOC~~ SOLN
20.0000 [IU] | Freq: Every day | SUBCUTANEOUS | Status: DC
  Administered 2018-11-25 – 2018-12-07 (×13): 20 [IU] via SUBCUTANEOUS
  Filled 2018-11-25 (×15): qty 0.2

## 2018-11-25 MED ORDER — INSULIN ASPART 100 UNIT/ML ~~LOC~~ SOLN
0.0000 [IU] | Freq: Three times a day (TID) | SUBCUTANEOUS | Status: DC
  Administered 2018-11-25: 3 [IU] via SUBCUTANEOUS
  Administered 2018-11-26: 1 [IU] via SUBCUTANEOUS
  Administered 2018-11-26: 12:00:00 3 [IU] via SUBCUTANEOUS
  Administered 2018-11-27 (×2): 1 [IU] via SUBCUTANEOUS
  Administered 2018-11-27: 2 [IU] via SUBCUTANEOUS
  Administered 2018-11-28: 17:00:00 1 [IU] via SUBCUTANEOUS
  Administered 2018-11-29 (×2): 2 [IU] via SUBCUTANEOUS
  Administered 2018-11-30 – 2018-12-03 (×2): 1 [IU] via SUBCUTANEOUS
  Administered 2018-12-03: 2 [IU] via SUBCUTANEOUS
  Administered 2018-12-04 (×3): 1 [IU] via SUBCUTANEOUS

## 2018-11-25 MED ORDER — SODIUM CHLORIDE 0.9 % IV BOLUS
500.0000 mL | Freq: Once | INTRAVENOUS | Status: AC
  Administered 2018-11-25: 500 mL via INTRAVENOUS

## 2018-11-25 MED ORDER — MIDODRINE HCL 5 MG PO TABS
15.0000 mg | ORAL_TABLET | Freq: Three times a day (TID) | ORAL | Status: DC
  Filled 2018-11-25: qty 3

## 2018-11-25 MED ORDER — MIDODRINE HCL 5 MG PO TABS
15.0000 mg | ORAL_TABLET | Freq: Three times a day (TID) | ORAL | Status: DC
  Administered 2018-11-25 – 2018-12-04 (×27): 15 mg via ORAL
  Filled 2018-11-25 (×37): qty 3

## 2018-11-25 MED ORDER — SEVELAMER CARBONATE 800 MG PO TABS
800.0000 mg | ORAL_TABLET | Freq: Three times a day (TID) | ORAL | Status: DC
  Administered 2018-11-25 – 2018-12-04 (×21): 800 mg via ORAL
  Filled 2018-11-25 (×25): qty 1

## 2018-11-25 MED ORDER — SODIUM CHLORIDE 0.9 % IV BOLUS
250.0000 mL | Freq: Once | INTRAVENOUS | Status: AC
  Administered 2018-11-25: 08:00:00 500 mL via INTRAVENOUS

## 2018-11-25 MED ORDER — LOPERAMIDE HCL 1 MG/7.5ML PO SUSP
2.0000 mg | ORAL | Status: DC | PRN
  Administered 2018-11-25 – 2018-11-29 (×3): 2 mg via ORAL
  Filled 2018-11-25 (×6): qty 15

## 2018-11-25 MED ORDER — SODIUM CHLORIDE 0.9 % IV SOLN
2.0000 g | INTRAVENOUS | Status: AC
  Administered 2018-11-25: 2 g via INTRAVENOUS
  Filled 2018-11-25: qty 2

## 2018-11-25 MED ORDER — SODIUM CHLORIDE 0.9 % IV SOLN
2.0000 g | INTRAVENOUS | Status: DC
  Administered 2018-11-28 – 2018-12-01 (×4): 2 g via INTRAVENOUS
  Filled 2018-11-25 (×9): qty 2

## 2018-11-25 MED ORDER — GABAPENTIN 100 MG PO CAPS
100.0000 mg | ORAL_CAPSULE | Freq: Three times a day (TID) | ORAL | Status: DC
  Administered 2018-11-25 – 2018-12-04 (×27): 100 mg via ORAL
  Filled 2018-11-25 (×28): qty 1

## 2018-11-25 MED ORDER — OXYCODONE-ACETAMINOPHEN 5-325 MG PO TABS
2.0000 | ORAL_TABLET | Freq: Once | ORAL | Status: AC
  Administered 2018-11-25: 04:00:00 2 via ORAL
  Filled 2018-11-25: qty 2

## 2018-11-25 NOTE — Progress Notes (Signed)
[name]  [name] 1955-06-29  ZA:[id number].    Requesting MD: Cherylann Ratel Chief Complaint/Reason for Consult: sacral wound  HPI:  [name] is a 63yo male with MMP including DM, ESRD, MO, HTN, h/o left AKA, and mostly bed/wheelchair bound, who was admitted to [facility] yesterday with concerns of a sacral wound. States that he has had small wounds in the past that resolved without intervention. This wound appeared 1-2 weeks ago and has gradually gotten worse. His wife has been trying to do dressing changes, but states that wound looked significantly worse yesterday so he decided to come to the ED. Denies fever, chills, abdominal pain, nausea, vomiting. Urostomy with green output but he states he has a yeast infection and it has looked like this for some time. He has been seen at a wound clinic in [facility] for other wounds.  ROS: Review of Systems  Constitutional: Negative.  Negative for chills and fever.  HENT: Negative.   Eyes: Negative.   Respiratory: Negative.   Cardiovascular: Negative.   Gastrointestinal: Negative for abdominal pain, constipation, diarrhea, nausea and vomiting.  Genitourinary: Negative.   Musculoskeletal: Positive for back pain and joint pain.  Skin:       Sacral wound  Neurological: Negative.     All systems reviewed and otherwise negative except for as above  Family History  Problem Relation Age of Onset  . Diabetes Mother   . Heart attack Mother   . Stroke Mother   . Colon cancer Father   . Dementia Father   . Heart attack Father   . Arthritis Paternal Grandmother   . Hypertension Other   . Hypothyroidism Other     Past Medical History:  Diagnosis Date  . Anemia   . Anxiety   . AR (allergic rhinitis)   . Arthritis    osteoarthritis  . Blood transfusion   . BPH (benign prostatic hypertrophy)   . Cellulitis of left leg 08/14/2012  . Cholelithiasis   . Chronic kidney disease    hx of kidney stones  .  Chronic pain   . Diabetes mellitus    insulin dependent  . Displacement of lumbar intervertebral disc without myelopathy    L4-L5  . Diverticulosis of colon   . DVT (deep venous thrombosis) (HCC)    Lower Extremity  . Embolism and thrombosis of unspecified artery ([place])   . ESRD (end stage renal disease) ([place])   . Gallstone   . History of nephrolithiasis    Bilateral  . Hyperlipidemia   . Hypertension   . Hypertrophy of prostate   . Hypothyroidism   . Impotence of organic origin   . Morbid obesity ([place])   . Nephrolithiasis   . Non-pressure chronic ulcer of left calf, limited to breakdown of skin ([place]) 02/07/2016  . Osteomyelitis ([place])    left tibia and left knee  . Other lymphedema   . Pancreatitis, acute   . Peripheral vascular disease ([place])   . Personal history of arthritis    Osteoarthritis  . Pyelonephritis 10/29/2012  . Pyogenic arthritis of left knee joint ([place])   . S/P BKA (below knee amputation) unilateral ([place]) 01/22/2015  . Sleep apnea    uses cpap  . Subacute osteomyelitis of left tibia ([place]) 03/05/2017  . Traumatic closed nondisplaced fracture of neck of femur, left, sequela 03/05/2017  . Unspecified septicemia(038.9) [facility])     Past Surgical History:  Procedure Laterality Date  . AMPUTATION  02/07/2011  Procedure: AMPUTATION BELOW KNEE;  Surgeon: Newt Minion, MD;  Location: [place];  Service: Orthopedics;  Laterality: Left;  Left Below Knee Amputation  . AMPUTATION Left 03/09/2017   Procedure: LEFT ABOVE KNEE AMPUTATION;  Surgeon: Newt Minion, MD;  Location: [place];  Service: Orthopedics;  Laterality: Left;  . bilteral hip     replacement & revison  's   . CYSTOSCOPY W/ RETROGRADES Bilateral 10/24/2012   Procedure: CYSTOSCOPY WITH RETROGRADE PYELOGRAM Procedure: Cystoscopy, Bilateral Retrogarde Pyelograms, Bladder Biopsy, Hydrodistension;  Surgeon: Franchot Gallo, MD;  Location: [facility];  Service: Urology;  Laterality: Bilateral;  . KIDNEY SURGERY    . KNEE  SURGERY     left knee    . LEG AMPUTATION BELOW KNEE    . REPLACEMENT TOTAL KNEE    . TONSILLECTOMY      Social History:  reports that he has never smoked. He has never used smokeless tobacco. He reports that he does not drink alcohol or use drugs.  Allergies: No Known Allergies  Medications Prior to Admission  Medication Sig Dispense Refill  . acetaminophen (TYLENOL) 500 MG tablet Take 500 mg by mouth every 6 (six) hours as needed for moderate pain.    Marland Kitchen atorvastatin (LIPITOR) 40 MG tablet TAKE 1 TABLET (40 MG TOTAL) BY MOUTH DAILY. (Patient taking differently: Take 40 mg by mouth daily. ) 90 tablet 1  . gabapentin (NEURONTIN) 100 MG capsule TAKE 1 CAPSULE (100 MG TOTAL) BY MOUTH 3 (THREE) TIMES DAILY. (Patient taking differently: Take 100 mg by mouth 3 (three) times daily. ) 90 capsule 1  . HYDROcodone-acetaminophen (NORCO/VICODIN) 5-325 MG tablet Take 1 tablet by mouth every 6 (six) hours as needed (pain). 28 tablet 0  . insulin aspart (NOVOLOG) 100 UNIT/ML injection Inject 3-10 Units as directed 2 (two) times daily. Sliding scale 100-150 3 units 151-200 4 units 201-250 6 units 251-300 8 units 301-350 10 units    . Insulin Glargine (BASAGLAR KWIKPEN) 100 UNIT/ML SOPN Inject 30 Units into the skin daily.     Marland Kitchen levothyroxine (SYNTHROID) 125 MCG tablet Take by mouth.    . loperamide (IMODIUM A-D) 2 MG tablet Take 2 mg by mouth 2 (two) times daily.     . midodrine (PROAMATINE) 5 MG tablet Take 15 mg by mouth 3 (three) times daily with meals.     . sevelamer carbonate (RENVELA) 800 MG tablet Take 800 mg by mouth 3 (three) times daily with meals.     . traMADol (ULTRAM) 50 MG tablet Take 50 mg by mouth as needed for pain.  2  . levothyroxine (SYNTHROID, LEVOTHROID) 200 MCG tablet Take 1 tablet (200 mcg total) by mouth every morning. (Patient not taking: Reported on 11/03/2018) 90 tablet 0    Prior to Admission medications   Medication Sig Start Date End Date Taking? Authorizing Provider   acetaminophen (TYLENOL) 500 MG tablet Take 500 mg by mouth every 6 (six) hours as needed for moderate pain.   Yes [provider]  atorvastatin (LIPITOR) 40 MG tablet TAKE 1 TABLET (40 MG TOTAL) BY MOUTH DAILY. Patient taking differently: Take 40 mg by mouth daily.  09/13/15  Yes Brunetta Jeans, PA-C  gabapentin (NEURONTIN) 100 MG capsule TAKE 1 CAPSULE (100 MG TOTAL) BY MOUTH 3 (THREE) TIMES DAILY. Patient taking differently: Take 100 mg by mouth 3 (three) times daily.  12/26/15  Yes Brunetta Jeans, PA-C  HYDROcodone-acetaminophen (NORCO/VICODIN) 5-325 MG tablet Take 1 tablet by mouth every 6 (six)  hours as needed (pain). 10/21/18  Yes Newt Minion, MD  insulin aspart (NOVOLOG) 100 UNIT/ML injection Inject 3-10 Units as directed 2 (two) times daily. Sliding scale 100-150 3 units 151-200 4 units 201-250 6 units 251-300 8 units 301-350 10 units 09/27/16  Yes [provider]  Insulin Glargine (BASAGLAR KWIKPEN) 100 UNIT/ML SOPN Inject 30 Units into the skin daily.    Yes [provider]  levothyroxine (SYNTHROID) 125 MCG tablet Take by mouth. 07/05/18  Yes [provider]  loperamide (IMODIUM A-D) 2 MG tablet Take 2 mg by mouth 2 (two) times daily.    Yes [provider]  midodrine (PROAMATINE) 5 MG tablet Take 15 mg by mouth 3 (three) times daily with meals.  06/02/16  Yes [provider]  sevelamer carbonate (RENVELA) 800 MG tablet Take 800 mg by mouth 3 (three) times daily with meals.    Yes [provider]  traMADol (ULTRAM) 50 MG tablet Take 50 mg by mouth as needed for pain. 11/08/17  Yes [provider]  levothyroxine (SYNTHROID, LEVOTHROID) 200 MCG tablet Take 1 tablet (200 mcg total) by mouth every morning. Patient not taking: Reported on 11/16/2018 01/10/16   Brunetta Jeans, PA-C    Blood pressure (!) 73/47, pulse 88, temperature 98 F (36.7 C), resp. rate 18, weight 113.4 kg, SpO2 97 %. Physical Exam: General:  pleasant, chronically ill appearing white male who is laying in bed in NAD HEENT: head is normocephalic, atraumatic.  Sclera are noninjected.  Pupils equal and round.  Ears and nose without any masses or lesions.  Mouth is pink and moist. Dentition poor Heart: regular, rate, and rhythm.  No obvious murmurs, gallops, or rubs noted.  Lungs: CTAB, no wheezes, rhonchi, or rales noted.  Respiratory effort nonlabored Abd: obese, soft, NT/ND, +BS, no masses, hernias, or organomegaly. Urostomy with green drainage in pouch MS: s/p L AKA. R calf soft and nontender without edema Skin: warm and dry Psych: A&Ox3 with an appropriate affect. Neuro: moving all 4 extremities GU: sacral wound with thick black leather-y layer, this was debrided off, no tunnels noted; wound packed with saline dampened kerlex    After debridement:     Results for orders placed or performed during the hospital encounter of 11/14/2018 (from the past 48 hour(s))  SARS CORONAVIRUS 2 (TAT 6-24 HRS) Nasopharyngeal Nasopharyngeal Swab     Status: None   Collection Time: 11/12/2018  3:10 PM   Specimen: Nasopharyngeal Swab  Result Value Ref Range   SARS Coronavirus 2 NEGATIVE NEGATIVE    Comment: (NOTE) SARS-CoV-2 target nucleic acids are NOT DETECTED. The SARS-CoV-2 RNA is generally detectable in upper and lower respiratory specimens during the acute phase of infection. Negative results do not preclude SARS-CoV-2 infection, do not rule out co-infections with other pathogens, and should not be used as the sole basis for treatment or other patient management decisions. Negative results must be combined with clinical observations, patient history, and epidemiological information. The expected result is Negative. Fact Sheet for Patients: SugarRoll.be Fact Sheet for Healthcare Providers: https://www.woods-mathews.com/ This test is not yet approved or cleared by the [place] FDA and  has  been authorized for detection and/or diagnosis of SARS-CoV-2 by FDA under an Emergency Use Authorization (EUA). This EUA will remain  in effect (meaning this test can be used) for the duration of the COVID-19 declaration under Section 56 4(b)(1) of the Act, 21 U.S.C. section 360bbb-3(b)(1), unless the authorization is terminated or revoked sooner. Performed  at [facility], [place] [address], [place], [place] [address]   CBC with Differential     Status: Abnormal   Collection Time: 11/21/2018  5:22 PM  Result Value Ref Range   WBC 10.9 (H) 4.0 - 10.5 K/uL   RBC 3.89 (L) 4.22 - 5.81 MIL/uL   Hemoglobin 9.8 (L) 13.0 - 17.0 g/dL   HCT 35.4 (L) 39.0 - 52.0 %   MCV 91.0 80.0 - 100.0 fL   MCH 25.2 (L) 26.0 - 34.0 pg   MCHC 27.7 (L) 30.0 - 36.0 g/dL   RDW 17.0 (H) 11.5 - 15.5 %   Platelets 152 150 - 400 K/uL   nRBC 0.2 0.0 - 0.2 %   Neutrophils Relative % 89 %   Neutro Abs 9.6 (H) 1.7 - 7.7 K/uL   Lymphocytes Relative 5 %   Lymphs Abs 0.5 (L) 0.7 - 4.0 K/uL   Monocytes Relative 5 %   Monocytes Absolute 0.5 0.1 - 1.0 K/uL   Eosinophils Relative 0 %   Eosinophils Absolute 0.0 0.0 - 0.5 K/uL   Basophils Relative 0 %   Basophils Absolute 0.0 0.0 - 0.1 K/uL   Immature Granulocytes 1 %   Abs Immature Granulocytes 0.13 (H) 0.00 - 0.07 K/uL    Comment: Performed at [facility], [place] [address] [place], [place] [address]  Comprehensive metabolic panel     Status: Abnormal   Collection Time: 11/08/2018  5:22 PM  Result Value Ref Range   Sodium 134 (L) 135 - 145 mmol/L   Potassium 4.2 3.5 - 5.1 mmol/L   Chloride 96 (L) 98 - 111 mmol/L   CO2 26 22 - 32 mmol/L   Glucose, Bld 162 (H) 70 - 99 mg/dL   BUN 32 (H) 8 - 23 mg/dL   Creatinine, Ser 3.11 (H) 0.61 - 1.24 mg/dL   Calcium 6.9 (L) 8.9 - 10.3 mg/dL   Total Protein 6.2 (L) 6.5 - 8.1 g/dL   Albumin 2.2 (L) 3.5 - 5.0 g/dL   AST 12 (L) 15 - 41 U/L   ALT 10 0 - 44 U/L   Alkaline Phosphatase 198 (H) 38 - 126 U/L   Total  Bilirubin 1.1 0.3 - 1.2 mg/dL   GFR calc non Af Amer 21 (L) >60 mL/min   GFR calc Af Amer 24 (L) >60 mL/min   Anion gap 12 5 - 15    Comment: Performed at [facility], [place] [address], [place], [place] [address]  Protime-INR     Status: None   Collection Time: 11/16/2018  5:22 PM  Result Value Ref Range   Prothrombin Time 15.0 11.4 - 15.2 seconds   INR 1.2 0.8 - 1.2    Comment: (NOTE) INR goal varies based on device and disease states. Performed at [facility], [place] [address], [place], [place] [address]   APTT     Status: None   Collection Time: 11/21/2018  5:22 PM  Result Value Ref Range   aPTT 32 24 - 36 seconds    Comment: Performed at [facility], [place] [address] [place], [place] [address]  Sedimentation rate     Status: Abnormal   Collection Time: 10/29/2018  5:22 PM  Result Value Ref Range   Sed Rate 31 (H) 0 - 16 mm/hr    Comment: Performed at [facility], [place] [address], [place], [place] [address]  Lactic acid, plasma  Status: Abnormal   Collection Time: 11/26/2018  5:23 PM  Result Value Ref Range   Lactic Acid, Venous 2.5 (HH) 0.5 - 1.9 mmol/L    Comment: CRITICAL RESULT CALLED TO, READ BACK BY AND VERIFIED WITH: RAVILLE,C RN @2100  ON 11/01/2018 JACKSON,K Performed at [facility], [place] [address] [place], [place] [address]   Lactic acid, plasma     Status: Abnormal   Collection Time: 11/25/18  6:34 AM  Result Value Ref Range   Lactic Acid, Venous 2.3 (HH) 0.5 - 1.9 mmol/L    Comment: CRITICAL VALUE NOTED.  VALUE IS CONSISTENT WITH PREVIOUSLY REPORTED AND CALLED VALUE. Performed at [facility], [place] [address] [place], [place] [address]   C-reactive protein     Status: Abnormal   Collection Time: 11/25/18  6:34 AM  Result Value Ref Range   CRP 21.9 (H) <1.0 mg/dL    Comment: Performed at [facility], [place] [address] [place],  [place] [address]  Comprehensive metabolic panel     Status: Abnormal   Collection Time: 11/25/18  6:34 AM  Result Value Ref Range   Sodium 132 (L) 135 - 145 mmol/L   Potassium 4.5 3.5 - 5.1 mmol/L   Chloride 95 (L) 98 - 111 mmol/L   CO2 22 22 - 32 mmol/L   Glucose, Bld 133 (H) 70 - 99 mg/dL   BUN 41 (H) 8 - 23 mg/dL   Creatinine, Ser 3.34 (H) 0.61 - 1.24 mg/dL   Calcium 7.0 (L) 8.9 - 10.3 mg/dL   Total Protein 6.4 (L) 6.5 - 8.1 g/dL   Albumin 2.3 (L) 3.5 - 5.0 g/dL   AST 12 (L) 15 - 41 U/L   ALT 11 0 - 44 U/L   Alkaline Phosphatase 206 (H) 38 - 126 U/L   Total Bilirubin 1.2 0.3 - 1.2 mg/dL   GFR calc non Af Amer 19 (L) >60 mL/min   GFR calc Af Amer 22 (L) >60 mL/min   Anion gap 15 5 - 15    Comment: Performed at [facility], [place] [address], [place], [place] [address]  CBC     Status: Abnormal   Collection Time: 11/25/18  6:34 AM  Result Value Ref Range   WBC 11.2 (H) 4.0 - 10.5 K/uL   RBC 4.13 (L) 4.22 - 5.81 MIL/uL   Hemoglobin 10.3 (L) 13.0 - 17.0 g/dL   HCT 38.1 (L) 39.0 - 52.0 %   MCV 92.3 80.0 - 100.0 fL   MCH 24.9 (L) 26.0 - 34.0 pg   MCHC 27.0 (L) 30.0 - 36.0 g/dL   RDW 17.1 (H) 11.5 - 15.5 %   Platelets 133 (L) 150 - 400 K/uL   nRBC 0.4 (H) 0.0 - 0.2 %    Comment: Performed at [facility] [place] [address] [place], [place] [address]   Dg Chest Port 1 View  Result Date: 11/12/2018 CLINICAL DATA:  Hypoxia. EXAM: PORTABLE CHEST 1 VIEW COMPARISON:  Multiple chest x-rays since March 30, 2018 FINDINGS: No pneumothorax. Stable cardiomegaly. Opacity in the left base is more pronounced in the interval and somewhat platelike. No overt edema. No other acute abnormalities. IMPRESSION: There is opacity in the left base which is more pronounced in the interval. The somewhat platelike appearance suggests the possibility of atelectasis rather than pneumonia. Recommend clinical correlation and short-term follow-up to ensure resolution. Electronically  Signed   By: Dorise Bullion III M.D   On:  11/17/2018 15:38   Anti-infectives (From admission, onward)   Start     Dose/Rate Route Frequency Ordered Stop   11/26/18 1800  ceFEPIme (MAXIPIME) 2 g in sodium chloride 0.9 % 100 mL IVPB     2 g 200 mL/hr over 30 Minutes Intravenous Every T-Th-Sa (1800) 11/25/18 0916     11/26/18 1200  vancomycin (VANCOCIN) IVPB 1000 mg/200 mL premix     1,000 mg 200 mL/hr over 60 Minutes Intravenous Every T-Th-Sa (Hemodialysis) 11/03/2018 1916     11/25/18 0945  ceFEPIme (MAXIPIME) 2 g in sodium chloride 0.9 % 100 mL IVPB     2 g 200 mL/hr over 30 Minutes Intravenous STAT 11/25/18 0915 11/25/18 1039   11/18/2018 1930  vancomycin (VANCOCIN) 2,500 mg in sodium chloride 0.9 % 500 mL IVPB     2,500 mg 250 mL/hr over 120 Minutes Intravenous  Once 11/17/2018 1847 11/22/2018 2342        Assessment/Plan DM ESRD - TTS Hypotension Hypothyroidism HLD S/p Left AKA  Sacral wound - Sacral wound debrided at bedside (procedure note below). Likely with some underlying osteomyelitis. Will ask PT to see and begin hydrotherapy. BID wet to dry dressing changes. If his wound does not clean up well may need surgical debridement.  ID - maxipime/vancomycin 9/27>> VTE - SCDs, sq heparin FEN - CM/HH diet Foley - urostomy Follow up - TBD  Wellington Hampshire, [facility] Surgery 11/25/2018, 10:59 AM Pager: [phone] Mon-Thurs 7:00 am-4:30 pm Fri 7:00 am -11:30 AM Sat-Sun 7:00 am-11:30 am      Debridement Procedure Note   Indications: The patient is a 63 y.o. male with stage 4 sacral wound. I recommended debridement of dead tissue followed by good wound care.  Pre-operative Diagnosis: Sacral wound   Post-operative Diagnosis: Same   Procedure Details  The risks, benefits, complications, treatment options, and expected outcomes were discussed with the patient. The patient agreed to proceed.    Large area of thick/black necrotic tissue was debrided from the  wound. No tunnels were noted or pockets of pus. Wound measures 15x7x3cm. The wound was packed with saline dampened kerlex and secured with a dry ABD pad and tape.    Complications:  None; patient tolerated the procedure well.  Wellington Hampshire, [facility] 11/25/2018, 11:12 AM Pager: [phone] Mon 7:00 am -11:30 AM Tues-Fri 7:00 am-4:30 pm Sat-Sun 7:00 am-11:30 am

## 2018-11-25 NOTE — ED Notes (Signed)
IV team unable to get blood from pt.  

## 2018-11-25 NOTE — ED Notes (Signed)
Phlebotomy contacted for blood draw.

## 2018-11-25 NOTE — ED Notes (Signed)
[facility] contacted for transport and paperwork printed 

## 2018-11-25 NOTE — ED Notes (Signed)
ED TO INPATIENT HANDOFF REPORT  Name/Age/Gender [name] 63 y.o. male  Code Status    Code Status Orders  (From admission, onward)         Start     Ordered   11/15/2018 2306  Full code  Continuous     11/07/2018 2306        Code Status History    Date Active Date Inactive Code Status Order ID Comments User Context   03/09/2017 1432 03/14/2017 2002 Full Code [id number]  Newt Minion, MD Inpatient   12/27/2016 1938 01/05/2017 1719 Full Code [id number]  Ivor Costa, MD ED   11/24/2015 0607 12/01/2015 0404 Full Code [id number]  Danford, Suann Larry, MD ED   10/29/2012 2225 11/04/2012 1953 Full Code [id number]  Janece Canterbury, MD Inpatient   08/14/2012 0126 08/21/2012 2148 Full Code [id number]  Juanito Doom, MD Inpatient   08/04/2012 2015 08/07/2012 2037 Full Code [id number]  Bonnielee Haff, MD Inpatient   02/07/2011 2159 02/10/2011 2012 Full Code [id number]  Imelda Pillow, RN Inpatient   02/01/2011 1702 02/07/2011 2159 Full Code [id number]  Pilar Plate, RN Inpatient   Advance Care Planning Activity      Home/SNF/Other Home  Chief Complaint bed sores  Level of Care/Admitting Diagnosis ED Disposition    ED Disposition Condition [place]: Sac [100100]  Level of Care: Med-Surg [16]  Covid Evaluation: Asymptomatic Screening Protocol (No Symptoms)  Diagnosis: Decubitus ulcer of sacral region, stage 4 [facility]) [id number]  Admitting Physician: Elwyn Reach [[id number]]  Attending Physician: Elwyn Reach [[id number]]  Estimated length of stay: past midnight tomorrow  Certification:: I certify this patient will need inpatient services for at least 2 midnights  PT Class (Do Not Modify): Inpatient [101]  PT Acc Code (Do Not Modify): Private [1]       Medical History Past Medical History:  Diagnosis Date  . Anemia   . Anxiety   . AR (allergic rhinitis)   . Arthritis    osteoarthritis  . Blood transfusion   . BPH (benign  prostatic hypertrophy)   . Cellulitis of left leg 08/14/2012  . Cholelithiasis   . Chronic kidney disease    hx of kidney stones  . Chronic pain   . Diabetes mellitus    insulin dependent  . Displacement of lumbar intervertebral disc without myelopathy    L4-L5  . Diverticulosis of colon   . DVT (deep venous thrombosis) (HCC)    Lower Extremity  . Embolism and thrombosis of unspecified artery ([place])   . ESRD (end stage renal disease) ([place])   . Gallstone   . History of nephrolithiasis    Bilateral  . Hyperlipidemia   . Hypertension   . Hypertrophy of prostate   . Hypothyroidism   . Impotence of organic origin   . Morbid obesity ([place])   . Nephrolithiasis   . Non-pressure chronic ulcer of left calf, limited to breakdown of skin ([place]) 02/07/2016  . Osteomyelitis ([place])    left tibia and left knee  . Other lymphedema   . Pancreatitis, acute   . Peripheral vascular disease ([name])   . Personal history of arthritis    Osteoarthritis  . Pyelonephritis 10/29/2012  . Pyogenic arthritis of left knee joint ([place])   . S/P BKA (below knee amputation) unilateral ([place]) 01/22/2015  . Sleep apnea    uses cpap  . Subacute osteomyelitis of left tibia ([place]) 03/05/2017  . Traumatic  closed nondisplaced fracture of neck of femur, left, sequela 03/05/2017  . Unspecified septicemia(038.9) ([place])     Allergies No Known Allergies  IV Location/Drains/Wounds Patient Lines/Drains/Airways Status   Active Line/Drains/Airways    Name:   Placement date:   Placement time:   Site:   Days:   Peripheral IV 11/25/18 Left;Lateral Forearm   11/25/18    0150    Forearm   less than 1   Fistula / Graft Right Upper arm Arteriovenous fistula   -    -    Upper arm      Negative Pressure Wound Therapy Leg Left   03/09/17    0757    -   626   Urostomy RLQ   -    -    RLQ      Incision (Closed) 03/09/17 Leg Left   03/09/17    0759     626   Pressure Ulcer 08/19/12 Deep Tissue Injury - Purple or maroon localized area of  discolored intact skin or blood-filled blister due to damage of underlying soft tissue from pressure and/or shear. dark pink, non blanchable ? dti vs stage one, painful to to   08/19/12    1000     2289   Pressure Injury 11/29/15 Stage II -  Partial thickness loss of dermis presenting as a shallow open ulcer with a red, pink wound bed without slough.   11/29/15    2230     1092   Pressure Injury 11/29/15 Stage I -  Intact skin with non-blanchable redness of a localized area usually over a bony prominence.   11/29/15    2230     1092   Pressure Injury 03/09/17 Stage I -  Intact skin with non-blanchable redness of a localized area usually over a bony prominence. skin pink, dry skin peeling, blanchable   03/09/17    0653     626   Wound 02/01/11 Other (Comment) Leg Left weaping cellulitis   02/01/11    1657    Leg   2854   Wound 08/19/12 Other (Comment) Leg Left open red and whitish area on left stump   08/19/12    1100    Leg   2289   Wound 10/29/12 Other (Comment) Leg Right;Lower red, edematous RLL one scab 1x1cm   10/29/12    2235    Leg   2218          Labs/Imaging Results for orders placed or performed during the hospital encounter of 11/09/2018 (from the past 48 hour(s))  SARS CORONAVIRUS 2 (TAT 6-24 HRS) Nasopharyngeal Nasopharyngeal Swab     Status: None   Collection Time: 11/14/2018  3:10 PM   Specimen: Nasopharyngeal Swab  Result Value Ref Range   SARS Coronavirus 2 NEGATIVE NEGATIVE    Comment: (NOTE) SARS-CoV-2 target nucleic acids are NOT DETECTED. The SARS-CoV-2 RNA is generally detectable in upper and lower respiratory specimens during the acute phase of infection. Negative results do not preclude SARS-CoV-2 infection, do not rule out co-infections with other pathogens, and should not be used as the sole basis for treatment or other patient management decisions. Negative results must be combined with clinical observations, patient history, and epidemiological information. The  expected result is Negative. Fact Sheet for Patients: SugarRoll.be Fact Sheet for Healthcare Providers: https://www.woods-mathews.com/ This test is not yet approved or cleared by the [place] FDA and  has been authorized for detection and/or diagnosis of SARS-CoV-2 by FDA under an  Emergency Use Authorization (EUA). This EUA will remain  in effect (meaning this test can be used) for the duration of the COVID-19 declaration under Section 56 4(b)(1) of the Act, 21 U.S.C. section 360bbb-3(b)(1), unless the authorization is terminated or revoked sooner. Performed at [facility], [place] [address] [place], [place] [address]   CBC with Differential     Status: Abnormal   Collection Time: 11/25/2018  5:22 PM  Result Value Ref Range   WBC 10.9 (H) 4.0 - 10.5 K/uL   RBC 3.89 (L) 4.22 - 5.81 MIL/uL   Hemoglobin 9.8 (L) 13.0 - 17.0 g/dL   HCT 35.4 (L) 39.0 - 52.0 %   MCV 91.0 80.0 - 100.0 fL   MCH 25.2 (L) 26.0 - 34.0 pg   MCHC 27.7 (L) 30.0 - 36.0 g/dL   RDW 17.0 (H) 11.5 - 15.5 %   Platelets 152 150 - 400 K/uL   nRBC 0.2 0.0 - 0.2 %   Neutrophils Relative % 89 %   Neutro Abs 9.6 (H) 1.7 - 7.7 K/uL   Lymphocytes Relative 5 %   Lymphs Abs 0.5 (L) 0.7 - 4.0 K/uL   Monocytes Relative 5 %   Monocytes Absolute 0.5 0.1 - 1.0 K/uL   Eosinophils Relative 0 %   Eosinophils Absolute 0.0 0.0 - 0.5 K/uL   Basophils Relative 0 %   Basophils Absolute 0.0 0.0 - 0.1 K/uL   Immature Granulocytes 1 %   Abs Immature Granulocytes 0.13 (H) 0.00 - 0.07 K/uL    Comment: Performed at [facility], [place] [address], [place], [place] [address]  Comprehensive metabolic panel     Status: Abnormal   Collection Time: 11/08/2018  5:22 PM  Result Value Ref Range   Sodium 134 (L) 135 - 145 mmol/L   Potassium 4.2 3.5 - 5.1 mmol/L   Chloride 96 (L) 98 - 111 mmol/L   CO2 26 22 - 32 mmol/L   Glucose, Bld 162 (H) 70 - 99 mg/dL   BUN 32 (H) 8 - 23 mg/dL    Creatinine, Ser 3.11 (H) 0.61 - 1.24 mg/dL   Calcium 6.9 (L) 8.9 - 10.3 mg/dL   Total Protein 6.2 (L) 6.5 - 8.1 g/dL   Albumin 2.2 (L) 3.5 - 5.0 g/dL   AST 12 (L) 15 - 41 U/L   ALT 10 0 - 44 U/L   Alkaline Phosphatase 198 (H) 38 - 126 U/L   Total Bilirubin 1.1 0.3 - 1.2 mg/dL   GFR calc non Af Amer 21 (L) >60 mL/min   GFR calc Af Amer 24 (L) >60 mL/min   Anion gap 12 5 - 15    Comment: Performed at [facility], [place] [address] [place], [place] [address]  Protime-INR     Status: None   Collection Time: 11/11/2018  5:22 PM  Result Value Ref Range   Prothrombin Time 15.0 11.4 - 15.2 seconds   INR 1.2 0.8 - 1.2    Comment: (NOTE) INR goal varies based on device and disease states. Performed at [facility] [place] [address], [place], [place] [address]   APTT     Status: None   Collection Time: 11/06/2018  5:22 PM  Result Value Ref Range   aPTT 32 24 - 36 seconds    Comment: Performed at [facility], [place] [address] [place], [place] [address]  Sedimentation rate     Status: Abnormal   Collection Time:  11/01/2018  5:22 PM  Result Value Ref Range   Sed Rate 31 (H) 0 - 16 mm/hr    Comment: Performed at [facility], [place] [address] [place], [place] [address]  Lactic acid, plasma     Status: Abnormal   Collection Time: 11/26/2018  5:23 PM  Result Value Ref Range   Lactic Acid, Venous 2.5 (HH) 0.5 - 1.9 mmol/L    Comment: CRITICAL RESULT CALLED TO, READ BACK BY AND VERIFIED WITH: Makinzee Durley,C RN @2100  ON 11/23/2018 JACKSON,K Performed at [facility], [place] [address] [place], [place] [address]    Dg Chest Port 1 View  Result Date: 11/05/2018 CLINICAL DATA:  Hypoxia. EXAM: PORTABLE CHEST 1 VIEW COMPARISON:  Multiple chest x-rays since March 30, 2018 FINDINGS: No pneumothorax. Stable cardiomegaly. Opacity in the left base is more pronounced in the interval and somewhat platelike. No overt edema. No  other acute abnormalities. IMPRESSION: There is opacity in the left base which is more pronounced in the interval. The somewhat platelike appearance suggests the possibility of atelectasis rather than pneumonia. Recommend clinical correlation and short-term follow-up to ensure resolution. Electronically Signed   By: Dorise Bullion III M.D   On: 11/01/2018 15:38    Pending Labs Unresulted Labs (From admission, onward)    Start     Ordered   11/25/18 0500  Comprehensive metabolic panel  Tomorrow morning,   R     11/03/2018 2306   11/25/18 0500  CBC  Tomorrow morning,   R     11/23/2018 2306   11/17/2018 2306  HIV Antibody  (Routine Testing)  Once,   STAT     11/04/2018 2306   11/14/2018 2306  CBC  (heparin)  Once,   STAT    Comments: Baseline for heparin therapy IF NOT ALREADY DRAWN.  Notify MD if PLT < 100 K.    11/17/2018 2306   11/02/2018 2306  Creatinine, serum  (heparin)  Once,   STAT    Comments: Baseline for heparin therapy IF NOT ALREADY DRAWN.    11/22/2018 2306   11/16/2018 1446  Lactic acid, plasma  Now then every 2 hours,   STAT     11/19/2018 1445   11/09/2018 1446  C-reactive protein  Once,   STAT     11/23/2018 1445          Vitals/Pain Today's Vitals   11/25/18 0114 11/25/18 0530 11/25/18 0556 11/25/18 0601  BP: (!) 89/64  (!) 79/50 (!) 73/53  Pulse: 80 79 83 82  Resp: 18 15  (!) 23  Temp:      TempSrc:      SpO2: 100% 100% 100% 100%  Weight:      PainSc:        Isolation Precautions No active isolations  Medications Medications  heparin injection 5,000 Units (has no administration in time range)  ondansetron (ZOFRAN) tablet 4 mg ( Oral See Alternative 11/25/18 0553)    Or  ondansetron (ZOFRAN) injection 4 mg (4 mg Intravenous Given 11/25/18 0553)  acetaminophen (TYLENOL) tablet 650 mg (has no administration in time range)    Or  acetaminophen (TYLENOL) suppository 650 mg (has no administration in time range)  vancomycin (VANCOCIN) IVPB 1000 mg/200 mL premix (has no  administration in time range)  sodium chloride 0.9 % bolus 500 mL (0 mLs Intravenous Stopped 11/19/2018 1830)  vancomycin (VANCOCIN) 2,500 mg in sodium chloride 0.9 % 500 mL IVPB (0 mg Intravenous Stopped 11/01/2018 2342)  oxyCODONE-acetaminophen (PERCOCET/ROXICET) 5-325 MG per tablet 2 tablet (2 tablets Oral Given 11/25/18 0428)    Mobility non-ambulatory

## 2018-11-25 NOTE — ED Notes (Signed)
Phlebotomy team called again with request to come draw blood from pt.

## 2018-11-25 NOTE — Progress Notes (Addendum)
Pt arrived to [facility] via [facility]. Pt remains on chronic 4L oxygen at this time. Cherylann Ratel, DO paged regarding pt's BP 73/47 and made aware that pt is now yellow MEWS - see new orders. Will continue to monitor.

## 2018-11-25 NOTE — Progress Notes (Signed)
Pharmacy Antibiotic Note  [name] is a 63 y.o. male admitted on 11/21/2018 with a decubitus ulcer wound infection.  Pharmacy consulted on 11/04/2018 for vancomycin dosing. Pt stated on admit his dry weight is 250 pounds and he does not recall getting any vancomycin at his HD center.   ESRD on HD TTS. Today 9/28, pharmacy consulted to add Cefepime for sepsis. WBC 11.2, afebrile.  Surgery consulted for decubitus ulcer and plans for surgical debridement today 9/28  Plan: Cefepime 2 g IV STAT and 2 g IV qHD-TTS. Continue Vancomyin 1000 mg after each HD on TTS F/u HD schedule F/u WBC, temp, clinical course  Weight: 250 lb (113.4 kg)  Temp (24hrs), Avg:97.8 F (36.6 C), Min:97.6 F (36.4 C), Max:98 F (36.7 C)  Recent Labs  Lab 10/30/2018 1722 11/06/2018 1723 11/25/18 0634  WBC 10.9*  --  11.2*  CREATININE 3.11*  --  3.34*  LATICACIDVEN  --  2.5* 2.3*    CrCl cannot be calculated (Unknown ideal weight.).    No Known Allergies  Antimicrobials this admission: 9/27 Vanc>> 9/28 Cefepime>>  Dose adjustments this admission:  Microbiology results: 9/27 COVID : negative  Thank you for allowing pharmacy to be a part of this patient's care.  Nicole Cella, [place] Please check AMION for all [facility] phone numbers After 10:00 PM, call [place] [phone] 11/25/2018 10:37 AM

## 2018-11-25 NOTE — Plan of Care (Signed)
  Problem: Education: Goal: Knowledge of General Education information will improve Description: Including pain rating scale, medication(s)/side effects and non-pharmacologic comfort measures Outcome: Progressing   Problem: Health Behavior/Discharge Planning: Goal: Ability to manage health-related needs will improve Outcome: Progressing   Problem: Clinical Measurements: Goal: Respiratory complications will improve Outcome: Progressing Goal: Cardiovascular complication will be avoided Outcome: Progressing   Problem: Nutrition: Goal: Adequate nutrition will be maintained Outcome: Progressing   Problem: Elimination: Goal: Will not experience complications related to bowel motility Outcome: Progressing Goal: Will not experience complications related to urinary retention Outcome: Progressing   Problem: Pain Managment: Goal: General experience of comfort will improve Outcome: Progressing   Problem: Safety: Goal: Ability to remain free from injury will improve Outcome: Progressing   Problem: Skin Integrity: Goal: Risk for impaired skin integrity will decrease Outcome: Progressing   

## 2018-11-25 NOTE — Progress Notes (Signed)
RN spoke with Abigail Butts, RN at [facility] to inform of pt arrival to unit. Will continue to monitor.

## 2018-11-25 NOTE — Progress Notes (Signed)
[facility]  PROGRESS NOTE    [name] HEFFERN  BRA:[id number] DOB: 09/22/1955 DOA: 10/30/2018 PCP: [place]   Brief Narrative:   [name] is a 63 y.o. male with medical history significant of end-stage renal disease on hemodialysis TTS, peripheral vascular disease status post left AKA, right femoral fracture, diabetes, morbid obesity, hypertension, hyperlipidemia presenting to the ER with ulcer in his sacral.  Patient is largely wheelchair and bedbound.  He also spends at least 4 hours at dialysis 3 times a week therefore putting a lot of pressure on his sacral area.  He developed a small ulcer apparently about 4 weeks ago which has now progressed to stage IV sacral decubitus ulcer.  He came in with a pain as well as discharge from the area.  Patient was seen in the ER and evaluated and help to have what appears to be infected decubitus ulcer.  He has some eschar over it.  Has not used anything prior to now.  Denied any fever or chills no nausea vomiting or diarrhea.  Patient is being admitted for management of his decubitus ulcer which may require debridement and aggressive wound care.  9/28: [facility]. Hypotensive but in no distress. Denies complaints.   Assessment & Plan:   Active Problems:   Type 2 diabetes mellitus with diabetic nephropathy, with long-term current use of insulin (HCC)   OBESITY   Essential hypertension   End stage renal disease (HCC)   Chronic venous insufficiency   Unstageable pressure ulcer of right buttock (HCC)   Closed fracture of distal end of right femur (HCC)   S/P AKA (above knee amputation) unilateral, left (HCC)   Decubitus ulcer of sacral region, stage 4 (HCC)   Stage 4 sacral decubitis ulcer     - vanc, cefepime     - surgery consult; recs noted, appreciated; Per Dr Johney [place], surgery to eval this morning     - NPO for now     - MRI sacrum?     - hypotensive this AM; 500cc bolus, then run rate; watch resp status d/t Hx of ESRD   ESRD     - TTS     - consult nephrology     - resume renvela when no longer NPO  DM2     - DM diet once NPO status lifted     - SSI, home lantus, glucose checks  Hx of hypotension     - takes midodrine at home; can resume once no longer NPO  Elevated alk phos     - check ggt  Hypothyroidism     - resume synthroid when no longer NPO  HLD     - resume lipitor when no longer NPO  Neuropathy     - resume gabapentin when no longer NPO  DVT prophylaxis: Heparin Code Status: FULL Family Communication: None at bed side   Disposition Plan: TBD  Consultants:   General Surgery  Antimicrobials:  . Vanc/cefepime   ROS:  Denies CP, dyspnea, HA, palpitations . Remainder 10-pt ROS is negative for all not previously mentioned.  Subjective: "I need my medicine."  Objective: Vitals:   11/25/18 0700 11/25/18 0715 11/25/18 0730 11/25/18 0851  BP: (!) 82/64  94/63 (!) 73/47  Pulse:    88  Resp: _0 Temp:    98 F (36.7 C)  TempSrc:      SpO2:    97%  Weight:  Intake/Output Summary (Last 24 hours) at 11/25/2018 0953 Last data filed at 11/09/2018 1830 Gross per 24 hour  Intake 500 ml  Output -  Net 500 ml   Filed Weights   11/08/2018 1905  Weight: 113.4 kg    Examination:  General: 63 y.o. male resting in bed in NAD Eyes: PERRL, normal sclera ENMT: Nares patent w/o discharge, orophaynx clear, dentition normal, ears w/o discharge/lesions/ulcers Cardiovascular: RRR, +S1, S2, no m/g/r, equal pulses throughout Respiratory: CTABL, no w/r/r, normal WOB GI: BS+, NDNT, no masses noted, no organomegaly noted MSK: No e/c/c; L AKA, RLE in boot Skin: No rashes, bruises, ulcerations noted Neuro: A&O x 3, no focal deficits Psyc: Appropriate interaction and affect, calm/cooperative   Data Reviewed: I have personally reviewed following labs and imaging studies.  CBC: Recent Labs  Lab 10/30/2018 1722 11/25/18 0634  WBC 10.9* 11.2*  NEUTROABS 9.6*  --   HGB  9.8* 10.3*  HCT 35.4* 38.1*  MCV 91.0 92.3  PLT 152 546*   Basic Metabolic Panel: Recent Labs  Lab 11/23/2018 1722 11/25/18 0634  NA 134* 132*  K 4.2 4.5  CL 96* 95*  CO2 26 22  GLUCOSE 162* 133*  BUN 32* 41*  CREATININE 3.11* 3.34*  CALCIUM 6.9* 7.0*   GFR: CrCl cannot be calculated (Unknown ideal weight.). Liver Function Tests: Recent Labs  Lab 11/15/2018 1722 11/25/18 0634  AST 12* 12*  ALT 10 11  ALKPHOS 198* 206*  BILITOT 1.1 1.2  PROT 6.2* 6.4*  ALBUMIN 2.2* 2.3*   No results for input(s): LIPASE, AMYLASE in the last 168 hours. No results for input(s): AMMONIA in the last 168 hours. Coagulation Profile: Recent Labs  Lab 11/01/2018 1722  INR 1.2   Cardiac Enzymes: No results for input(s): CKTOTAL, CKMB, CKMBINDEX, TROPONINI in the last 168 hours. BNP (last 3 results) No results for input(s): PROBNP in the last 8760 hours. HbA1C: No results for input(s): HGBA1C in the last 72 hours. CBG: No results for input(s): GLUCAP in the last 168 hours. Lipid Profile: No results for input(s): CHOL, HDL, LDLCALC, TRIG, CHOLHDL, LDLDIRECT in the last 72 hours. Thyroid Function Tests: No results for input(s): TSH, T4TOTAL, FREET4, T3FREE, THYROIDAB in the last 72 hours. Anemia Panel: No results for input(s): VITAMINB12, FOLATE, FERRITIN, TIBC, IRON, RETICCTPCT in the last 72 hours. Sepsis Labs: Recent Labs  Lab 11/27/2018 1723 11/25/18 0634  LATICACIDVEN 2.5* 2.3*    Recent Results (from the past 240 hour(s))  SARS CORONAVIRUS 2 (TAT 6-24 HRS) Nasopharyngeal Nasopharyngeal Swab     Status: None   Collection Time: 11/15/2018  3:10 PM   Specimen: Nasopharyngeal Swab  Result Value Ref Range Status   SARS Coronavirus 2 NEGATIVE NEGATIVE Final    Comment: (NOTE) SARS-CoV-2 target nucleic acids are NOT DETECTED. The SARS-CoV-2 RNA is generally detectable in upper and lower respiratory specimens during the acute phase of infection. Negative results do not preclude  SARS-CoV-2 infection, do not rule out co-infections with other pathogens, and should not be used as the sole basis for treatment or other patient management decisions. Negative results must be combined with clinical observations, patient history, and epidemiological information. The expected result is Negative. Fact Sheet for Patients: SugarRoll.be Fact Sheet for Healthcare Providers: https://www.woods-mathews.com/ This test is not yet approved or cleared by the [place] FDA and  has been authorized for detection and/or diagnosis of SARS-CoV-2 by FDA under an Emergency Use Authorization (EUA). This EUA will remain  in effect (meaning this test  can be used) for the duration of the COVID-19 declaration under Section 56 4(b)(1) of the Act, 21 U.S.C. section 360bbb-3(b)(1), unless the authorization is terminated or revoked sooner. Performed at [facility], [place] [address] [place], [place] [address]       Radiology Studies: Dg Chest Port 1 View  Result Date: 10/30/2018 CLINICAL DATA:  Hypoxia. EXAM: PORTABLE CHEST 1 VIEW COMPARISON:  Multiple chest x-rays since March 30, 2018 FINDINGS: No pneumothorax. Stable cardiomegaly. Opacity in the left base is more pronounced in the interval and somewhat platelike. No overt edema. No other acute abnormalities. IMPRESSION: There is opacity in the left base which is more pronounced in the interval. The somewhat platelike appearance suggests the possibility of atelectasis rather than pneumonia. Recommend clinical correlation and short-term follow-up to ensure resolution. Electronically Signed   By: Dorise Bullion III M.D   On: 11/09/2018 15:38     Scheduled Meds: . heparin  5,000 Units Subcutaneous Q8H   Continuous Infusions: . ceFEPime (MAXIPIME) IV    . [START ON 11/26/2018] ceFEPime (MAXIPIME) IV    . [START ON 11/26/2018] vancomycin       LOS: 1 day    Time spent: 35 minutes spent in  the coordination of care today.    Jonnie Finner, DO [facility] Pager [phone]  If 7PM-7AM, please contact night-coverage [email or web address] Password [facility] 11/25/2018, 9:53 AM

## 2018-11-25 NOTE — Consult Note (Addendum)
[facility] Renal Consultation Note    Indication for Consultation:  Management of ESRD/hemodialysis; anemia, hypertension/volume and secondary hyperparathyroidism  HPI: [name] is a 63 y.o. male with ESRD on HD, DM, HTN, PVD s/p L AKA, chronic hypotension on midodrine, cystectomy s/p urostomy, obesity.   Admitted for management of infected sacral decubitius ulcer. Patient coming from home, limited mobility and mostly bed bound, wheelchair bound. Surgery consulted and underwent bedside debridement this am.  Empiric antibiotics started.   Dialyzes TTS at [facility] on [address]. Asked to see for routine dialysis needs. Labs significant for Na 132, K 4.5, Ca 7.0, Alk phos 206.   Seen and examined at bedside. Afebrile. SBPs 80s. O2 sats 100% on 4L [place]. He is alert, upset about being woken from sleep. Endorses O2 use at home. Didn't remember he had debridement this am. Denies CP, SOB, N,V,D.   Past Medical History:  Diagnosis Date  . Anemia   . Anxiety   . AR (allergic rhinitis)   . Arthritis    osteoarthritis  . Blood transfusion   . BPH (benign prostatic hypertrophy)   . Cellulitis of left leg 08/14/2012  . Cholelithiasis   . Chronic kidney disease    hx of kidney stones  . Chronic pain   . Diabetes mellitus    insulin dependent  . Displacement of lumbar intervertebral disc without myelopathy    L4-L5  . Diverticulosis of colon   . DVT (deep venous thrombosis) (HCC)    Lower Extremity  . Embolism and thrombosis of unspecified artery ([place])   . ESRD (end stage renal disease) ([place])   . Gallstone   . History of nephrolithiasis    Bilateral  . Hyperlipidemia   . Hypertension   . Hypertrophy of prostate   . Hypothyroidism   . Impotence of organic origin   . Morbid obesity ([place])   . Nephrolithiasis   . Non-pressure chronic ulcer of left calf, limited to breakdown of skin ([place]) 02/07/2016  . Osteomyelitis ([place])    left tibia and left knee  .  Other lymphedema   . Pancreatitis, acute   . Peripheral vascular disease ([place])   . Personal history of arthritis    Osteoarthritis  . Pyelonephritis 10/29/2012  . Pyogenic arthritis of left knee joint ([place])   . S/P BKA (below knee amputation) unilateral ([place]) 01/22/2015  . Sleep apnea    uses cpap  . Subacute osteomyelitis of left tibia ([place]) 03/05/2017  . Traumatic closed nondisplaced fracture of neck of femur, left, sequela 03/05/2017  . Unspecified septicemia(038.9) [facility])    Past Surgical History:  Procedure Laterality Date  . AMPUTATION  02/07/2011   Procedure: AMPUTATION BELOW KNEE;  Surgeon: Newt Minion, MD;  Location: [facility];  Service: Orthopedics;  Laterality: Left;  Left Below Knee Amputation  . AMPUTATION Left 03/09/2017   Procedure: LEFT ABOVE KNEE AMPUTATION;  Surgeon: Newt Minion, MD;  Location: [facility];  Service: Orthopedics;  Laterality: Left;  . bilteral hip     replacement & revison  's   . CYSTOSCOPY W/ RETROGRADES Bilateral 10/24/2012   Procedure: CYSTOSCOPY WITH RETROGRADE PYELOGRAM Procedure: Cystoscopy, Bilateral [place]arde Pyelograms, Bladder Biopsy, Hydrodistension;  Surgeon: Franchot Gallo, MD;  Location: [facility];  Service: Urology;  Laterality: Bilateral;  . KIDNEY SURGERY    . KNEE SURGERY     left knee    . LEG AMPUTATION BELOW KNEE    . REPLACEMENT TOTAL KNEE    .  TONSILLECTOMY     Family History  Problem Relation Age of Onset  . Diabetes Mother   . Heart attack Mother   . Stroke Mother   . Colon cancer Father   . Dementia Father   . Heart attack Father   . Arthritis Paternal Grandmother   . Hypertension Other   . Hypothyroidism Other    Social History:  reports that he has never smoked. He has never used smokeless tobacco. He reports that he does not drink alcohol or use drugs. No Known Allergies Prior to Admission medications   Medication Sig Start Date End Date Taking? Authorizing Provider  acetaminophen (TYLENOL) 500 MG tablet Take 500 mg  by mouth every 6 (six) hours as needed for moderate pain.   Yes [provider]  atorvastatin (LIPITOR) 40 MG tablet TAKE 1 TABLET (40 MG TOTAL) BY MOUTH DAILY. Patient taking differently: Take 40 mg by mouth daily.  09/13/15  Yes Brunetta Jeans, PA-C  gabapentin (NEURONTIN) 100 MG capsule TAKE 1 CAPSULE (100 MG TOTAL) BY MOUTH 3 (THREE) TIMES DAILY. Patient taking differently: Take 100 mg by mouth 3 (three) times daily.  12/26/15  Yes Brunetta Jeans, PA-C  HYDROcodone-acetaminophen (NORCO/VICODIN) 5-325 MG tablet Take 1 tablet by mouth every 6 (six) hours as needed (pain). 10/21/18  Yes Newt Minion, MD  insulin aspart (NOVOLOG) 100 UNIT/ML injection Inject 3-10 Units as directed 2 (two) times daily. Sliding scale 100-150 3 units 151-200 4 units 201-250 6 units 251-300 8 units 301-350 10 units 09/27/16  Yes [provider]  Insulin Glargine (BASAGLAR KWIKPEN) 100 UNIT/ML SOPN Inject 30 Units into the skin daily.    Yes [provider]  levothyroxine (SYNTHROID) 125 MCG tablet Take by mouth. 07/05/18  Yes [provider]  loperamide (IMODIUM A-D) 2 MG tablet Take 2 mg by mouth 2 (two) times daily.    Yes [provider]  midodrine (PROAMATINE) 5 MG tablet Take 15 mg by mouth 3 (three) times daily with meals.  06/02/16  Yes [provider]  sevelamer carbonate (RENVELA) 800 MG tablet Take 800 mg by mouth 3 (three) times daily with meals.    Yes [provider]  traMADol (ULTRAM) 50 MG tablet Take 50 mg by mouth as needed for pain. 11/08/17  Yes [provider]  levothyroxine (SYNTHROID, LEVOTHROID) 200 MCG tablet Take 1 tablet (200 mcg total) by mouth every morning. Patient not taking: Reported on 10/30/2018 01/10/16   Brunetta Jeans, PA-C   Current Facility-Administered Medications  Medication Dose Route Frequency Provider Last Rate Last Dose  . acetaminophen (TYLENOL) tablet 650 mg  650 mg Oral Q6H PRN Elwyn Reach,  MD       Or  . acetaminophen (TYLENOL) suppository 650 mg  650 mg Rectal Q6H PRN Elwyn Reach, MD      . Derrill Memo ON 11/26/2018] ceFEPIme (MAXIPIME) 2 g in sodium chloride 0.9 % 100 mL IVPB  2 g Intravenous Q T,Th,Sat-1800 Kyle, Tyrone A, DO      . heparin injection 5,000 Units  5,000 Units Subcutaneous Q8H Elwyn Reach, MD   5,000 Units at 11/25/18 1323  . insulin aspart (novoLOG) injection 0-9 Units  0-9 Units Subcutaneous TID WC Kyle, Tyrone A, DO      . insulin glargine (LANTUS) injection 20 Units  20 Units Subcutaneous QHS Kyle, Tyrone A, DO      . midodrine (PROAMATINE) tablet 15 mg  15 mg Oral TID WC Marylyn Ishihara,  Tyrone A, DO   15 mg at 11/25/18 1459  . ondansetron (ZOFRAN) tablet 4 mg  4 mg Oral Q6H PRN Elwyn Reach, MD       Or  . ondansetron (ZOFRAN) injection 4 mg  4 mg Intravenous Q6H PRN Elwyn Reach, MD   4 mg at 11/25/18 0553  . sevelamer carbonate (RENVELA) tablet 800 mg  800 mg Oral TID WC Kyle, Tyrone A, DO      . [START ON 11/26/2018] vancomycin (VANCOCIN) IVPB 1000 mg/200 mL premix  1,000 mg Intravenous Q T,Th,Sa-HD Bell, Michelle T, RPH         ROS: As per HPI otherwise negative.  Physical Exam: Vitals:   11/25/18 0730 11/25/18 0851 11/25/18 1100 11/25/18 1323  BP: 94/63 (!) 73/47 (!) 86/60 (!) 81/55  Pulse:  88 81 82  Resp:  '18 18 18  ' Temp:  98 F (36.7 C) 98.3 F (36.8 C) 97.8 F (36.6 C)  TempSrc:   Oral Oral  SpO2:  97% 100% 100%  Weight:         General: Obese male lying flat in bed NAD  Head: NCAT sclera not icteric  Neck: JVD not appreciated  Lungs: CTA bilaterally without wheezes, rales, or rhonchi. Using nasal O2  Heart: RRR with S1 S2 Abdomen:  soft NT + BS Lower extremities: L AKA. Venous skin changes. No sig LE edema.  Neuro: A & O  X 3. Moves all extremities spontaneously. Psych:  Flat affect  Dialysis Access: RUE AVF aneurysmal, pulsatile bruit   Labs: Basic Metabolic Panel: Recent Labs  Lab 11/20/2018 1722 11/25/18 0634  NA  134* 132*  K 4.2 4.5  CL 96* 95*  CO2 26 22  GLUCOSE 162* 133*  BUN 32* 41*  CREATININE 3.11* 3.34*  CALCIUM 6.9* 7.0*   Liver Function Tests: Recent Labs  Lab 11/08/2018 1722 11/25/18 0634  AST 12* 12*  ALT 10 11  ALKPHOS 198* 206*  BILITOT 1.1 1.2  PROT 6.2* 6.4*  ALBUMIN 2.2* 2.3*   No results for input(s): LIPASE, AMYLASE in the last 168 hours. No results for input(s): AMMONIA in the last 168 hours. CBC: Recent Labs  Lab 11/16/2018 1722 11/25/18 0634  WBC 10.9* 11.2*  NEUTROABS 9.6*  --   HGB 9.8* 10.3*  HCT 35.4* 38.1*  MCV 91.0 92.3  PLT 152 133*   Cardiac Enzymes: No results for input(s): CKTOTAL, CKMB, CKMBINDEX, TROPONINI in the last 168 hours. CBG: No results for input(s): GLUCAP in the last 168 hours. Iron Studies: No results for input(s): IRON, TIBC, TRANSFERRIN, FERRITIN in the last 72 hours. Studies/Results: Dg Chest Port 1 View  Result Date: 11/05/2018 CLINICAL DATA:  Hypoxia. EXAM: PORTABLE CHEST 1 VIEW COMPARISON:  Multiple chest x-rays since March 30, 2018 FINDINGS: No pneumothorax. Stable cardiomegaly. Opacity in the left base is more pronounced in the interval and somewhat platelike. No overt edema. No other acute abnormalities. IMPRESSION: There is opacity in the left base which is more pronounced in the interval. The somewhat platelike appearance suggests the possibility of atelectasis rather than pneumonia. Recommend clinical correlation and short-term follow-up to ensure resolution. Electronically Signed   By: Dorise Bullion III M.D   On: 11/14/2018 15:38    Dialysis Orders:  [facility] on [address]  TTS 3h 80mn EDW 250 lbs (113kg)  2K/2.5Ca Heparin none Access R AVF No VDRA Mircera 200 last 8/27   Assessment/Plan: 1. Stage IV sacral decubitus ulcer - Limited mobility. Mostly  wheelchair/bedbound. s/p bedside debridement per surgery this am. On Vanc, cefepime - per primary.  2. ESRD -  HD TTS. Continue on schedule. Next HD 9/29   3. Hypotension/Volume On midodrine for BP support. At EDW by weights here. UF 1-2L as tolerated 4. Anemia  - Hgb 10.3. On ESA as outpatient  5. Metabolic bone disease -  Corr Ca 8.4. No Vit D as outpatient. Continue binders.  6. Nutrition - Prot supp for low albumin  7. DM -insulin per primary  8. S/p cystectomy with urostomy   Lynnda Child PA-C [place] Pager [phone] 11/25/2018, 3:31 PM

## 2018-11-25 NOTE — Consult Note (Addendum)
[facility] Nurse ostomy consult note Pt is being followed by the surgical team for assessment and plan of care to the sacrum wound.   Stoma type/location: Requested to assess for ostomy supplies.  Pt is familiar to [facility] team from previous visits and urostomy has  Been in place for several years.  He is well-informed regarding pouching routines and states he or his daughter performs application and emptying when at home; his daughter is at the bedside and agrees. He states he has a chronic urine infection which turns the barrier ring and urine a green tint, and has been on antibiotics recently for this problem.  He says the pouch was changed on Sat and declines my offer to change today.  He requests it remain in place and it has a good seal, and he usually has 4 days of wear time.  Mod amt green urine in pouch.  Pt uses a one piece urostomy pouch and declines use of a bedside drainage bag.  2 sets of barrier rings and urostomy pouches left at the bedside for patient use and he denies need for further assistance at this time. Please re-consult if further assistance is needed.  Thank-you,  Julien Girt MSN, [place], [place], [place], [place]

## 2018-11-26 ENCOUNTER — Inpatient Hospital Stay (HOSPITAL_COMMUNITY): Payer: Medicare Other

## 2018-11-26 DIAGNOSIS — Z6833 Body mass index (BMI) 33.0-33.9, adult: Secondary | ICD-10-CM

## 2018-11-26 DIAGNOSIS — Z89612 Acquired absence of left leg above knee: Secondary | ICD-10-CM

## 2018-11-26 DIAGNOSIS — E669 Obesity, unspecified: Secondary | ICD-10-CM

## 2018-11-26 DIAGNOSIS — L89310 Pressure ulcer of right buttock, unstageable: Secondary | ICD-10-CM

## 2018-11-26 LAB — CBC WITH DIFFERENTIAL/PLATELET
Abs Immature Granulocytes: 0.25 10*3/uL — ABNORMAL HIGH (ref 0.00–0.07)
Basophils Absolute: 0 10*3/uL (ref 0.0–0.1)
Basophils Relative: 0 %
Eosinophils Absolute: 0.1 10*3/uL (ref 0.0–0.5)
Eosinophils Relative: 1 %
HCT: 33.7 % — ABNORMAL LOW (ref 39.0–52.0)
Hemoglobin: 9.7 g/dL — ABNORMAL LOW (ref 13.0–17.0)
Immature Granulocytes: 3 %
Lymphocytes Relative: 7 %
Lymphs Abs: 0.7 10*3/uL (ref 0.7–4.0)
MCH: 25.6 pg — ABNORMAL LOW (ref 26.0–34.0)
MCHC: 28.8 g/dL — ABNORMAL LOW (ref 30.0–36.0)
MCV: 88.9 fL (ref 80.0–100.0)
Monocytes Absolute: 0.4 10*3/uL (ref 0.1–1.0)
Monocytes Relative: 4 %
Neutro Abs: 8.6 10*3/uL — ABNORMAL HIGH (ref 1.7–7.7)
Neutrophils Relative %: 85 %
Platelets: 127 10*3/uL — ABNORMAL LOW (ref 150–400)
RBC: 3.79 MIL/uL — ABNORMAL LOW (ref 4.22–5.81)
RDW: 17 % — ABNORMAL HIGH (ref 11.5–15.5)
WBC: 10.1 10*3/uL (ref 4.0–10.5)
nRBC: 0.4 % — ABNORMAL HIGH (ref 0.0–0.2)

## 2018-11-26 LAB — RENAL FUNCTION PANEL
Albumin: 1.8 g/dL — ABNORMAL LOW (ref 3.5–5.0)
Anion gap: 15 (ref 5–15)
BUN: 49 mg/dL — ABNORMAL HIGH (ref 8–23)
CO2: 22 mmol/L (ref 22–32)
Calcium: 6.7 mg/dL — ABNORMAL LOW (ref 8.9–10.3)
Chloride: 95 mmol/L — ABNORMAL LOW (ref 98–111)
Creatinine, Ser: 4.02 mg/dL — ABNORMAL HIGH (ref 0.61–1.24)
GFR calc Af Amer: 17 mL/min — ABNORMAL LOW (ref >60)
GFR calc non Af Amer: 15 mL/min — ABNORMAL LOW (ref >60)
Glucose, Bld: 141 mg/dL — ABNORMAL HIGH (ref 70–99)
Phosphorus: 3.2 mg/dL (ref 2.5–4.6)
Potassium: 5.1 mmol/L (ref 3.5–5.1)
Sodium: 132 mmol/L — ABNORMAL LOW (ref 135–145)

## 2018-11-26 LAB — GLUCOSE, CAPILLARY
Glucose-Capillary: 148 mg/dL — ABNORMAL HIGH (ref 70–99)
Glucose-Capillary: 211 mg/dL — ABNORMAL HIGH (ref 70–99)
Glucose-Capillary: 232 mg/dL — ABNORMAL HIGH (ref 70–99)

## 2018-11-26 LAB — MAGNESIUM: Magnesium: 1.5 mg/dL — ABNORMAL LOW (ref 1.7–2.4)

## 2018-11-26 LAB — HIV ANTIBODY (ROUTINE TESTING W REFLEX): HIV Screen 4th Generation wRfx: NONREACTIVE

## 2018-11-26 MED ORDER — VANCOMYCIN HCL IN DEXTROSE 1-5 GM/200ML-% IV SOLN
INTRAVENOUS | Status: AC
  Filled 2018-11-26: qty 200

## 2018-11-26 MED ORDER — MORPHINE SULFATE (PF) 2 MG/ML IV SOLN
1.0000 mg | INTRAVENOUS | Status: DC | PRN
  Administered 2018-11-27: 2 mg via INTRAVENOUS
  Filled 2018-11-26 (×2): qty 1

## 2018-11-26 MED ORDER — OXYCODONE HCL 5 MG PO TABS
5.0000 mg | ORAL_TABLET | ORAL | Status: DC | PRN
  Administered 2018-11-27 – 2018-12-04 (×8): 5 mg via ORAL
  Filled 2018-11-26 (×9): qty 1

## 2018-11-26 MED ORDER — COLLAGENASE 250 UNIT/GM EX OINT
TOPICAL_OINTMENT | Freq: Every day | CUTANEOUS | Status: DC
  Administered 2018-11-27 – 2018-12-04 (×6): via TOPICAL
  Filled 2018-11-26 (×2): qty 30

## 2018-11-26 MED ORDER — LEVOTHYROXINE SODIUM 125 MCG PO TABS
125.0000 ug | ORAL_TABLET | Freq: Every day | ORAL | Status: DC
  Administered 2018-11-27 – 2018-12-04 (×8): 125 ug via ORAL
  Filled 2018-11-26 (×8): qty 1

## 2018-11-26 MED ORDER — PRO-STAT SUGAR FREE PO LIQD
30.0000 mL | Freq: Two times a day (BID) | ORAL | Status: DC
  Administered 2018-11-26 – 2018-12-04 (×16): 30 mL via ORAL
  Filled 2018-11-26 (×19): qty 30

## 2018-11-26 MED ORDER — MIDODRINE HCL 5 MG PO TABS
ORAL_TABLET | ORAL | Status: AC
  Filled 2018-11-26: qty 3

## 2018-11-26 NOTE — Progress Notes (Signed)
PROGRESS NOTE    [name]  [id number] DOB: 05-19-1955 DOA: 11/04/2018 PCP: [facility]   Brief Narrative: [name] is a 63 y.o. malewith medical history significant ofend-stage renal disease on hemodialysis TTS, peripheral vascular disease status post left AKA, right femoral fracture, diabetes, morbid obesity, hypertension, hyperlipidemia. Patient presented secondary to sacral ulcer.   Assessment & Plan:   Active Problems:   Type 2 diabetes mellitus with diabetic nephropathy, with long-term current use of insulin (HCC)   OBESITY   Essential hypertension   End stage renal disease (HCC)   Chronic venous insufficiency   Unstageable pressure ulcer of right buttock (HCC)   Closed fracture of distal end of right femur (HCC)   S/P AKA (above knee amputation) unilateral, left (HCC)   Decubitus ulcer of sacral region, stage 4 (HCC)   Sacral decubitus ulcer Stage IV and infected. S/p beside debridement on 9/28 by general surgery. Started on empiric Vancomycin and cefepime. No cultures obtained. Patient afebrile with only mildly elevated WBC.  -Continue Vancomycin/cefepime for now -General surgery recommendations: Hydrotherapy; possible need for surgical debridement -Will obtain plain film x-ray of sacrum to assess for osteomyelitis  Abdominal pain Patient recently in the hospital and had a perihepatic abscess requiring drain placement and antibiotics. -Will obtain CT abd/pelvis  ESRD on HD Nephrology on board for HD  Diabetes mellitus, type 2 -Continue Lantus and SSI  Chronic hypotension -Continue midodrine  Hypothyroidism -Continue Synthroid  Hyperlipidemia -Continue Lipitor  Neuropathy -Continue gabapentin  Obesity Body mass index is 33.91 kg/m. Patient is s/p AKA so BMI is underestimated   DVT prophylaxis: Heparin Code Status:   Code Status: Full Code Family Communication: Wife at bedside Disposition Plan: Discharge likely in  several days/weeks pending improvement of decubitus ulcer   Consultants:   General surgery  Nephrology  Procedures:   9/28: Bedside debridement  Hydrotherapy  Antimicrobials:  Vancomycin  Cefepime    Subjective: No significant issues overnight.   Objective: Vitals:   11/25/18 2150 11/26/18 0054 11/26/18 0454 11/26/18 0626  BP: 110/65 93/64 (!) 83/65   Pulse:  79 79   Resp:  20 20   Temp:  97.8 F (36.6 C) (!) 97.5 F (36.4 C)   TempSrc:  Oral Oral   SpO2:  100% 99% 100%  Weight:        Intake/Output Summary (Last 24 hours) at 11/26/2018 1523 Last data filed at 11/25/2018 1730 Gross per 24 hour  Intake 120 ml  Output 50 ml  Net 70 ml   Filed Weights   11/09/2018 1905  Weight: 113.4 kg    Examination:  General exam: Appears calm and comfortable Respiratory system: Clear to auscultation. Respiratory effort normal. Cardiovascular system: S1 & S2 heard, RRR. No murmurs, rubs, gallops or clicks. Gastrointestinal system: Abdomen is obese, soft and tender with guarding present. Ostomy. Normal bowel sounds heard. Central nervous system: Alert and oriented. No focal neurological deficits. Extremities: No edema. No calf tenderness. Left AKA Skin: No cyanosis. No rashes Psychiatry: Judgement and insight appear normal. Mood & affect appropriate.     Data Reviewed: I have personally reviewed following labs and imaging studies  CBC: Recent Labs  Lab 11/11/2018 1722 11/25/18 0634 11/26/18 0236  WBC 10.9* 11.2* 10.1  NEUTROABS 9.6*  --  8.6*  HGB 9.8* 10.3* 9.7*  HCT 35.4* 38.1* 33.7*  MCV 91.0 92.3 88.9  PLT 152 133* [id number]*   Basic Metabolic Panel: Recent Labs  Lab 11/09/2018 1722 11/25/18  [id number] 11/26/18 0236  NA 134* 132* 132*  K 4.2 4.5 5.1  CL 96* 95* 95*  CO2 26 22 22   GLUCOSE 162* 133* 141*  BUN 32* 41* 49*  CREATININE 3.11* 3.34* 4.02*  CALCIUM 6.9* 7.0* 6.7*  MG  --   --  1.5*  PHOS  --   --  3.2   GFR: CrCl cannot be calculated (Unknown  ideal weight.). Liver Function Tests: Recent Labs  Lab 10/30/2018 1722 11/25/18 0634 11/26/18 0236  AST 12* 12*  --   ALT 10 11  --   ALKPHOS 198* 206*  --   BILITOT 1.1 1.2  --   PROT 6.2* 6.4*  --   ALBUMIN 2.2* 2.3* 1.8*   No results for input(s): LIPASE, AMYLASE in the last 168 hours. No results for input(s): AMMONIA in the last 168 hours. Coagulation Profile: Recent Labs  Lab 10/31/2018 1722  INR 1.2   Cardiac Enzymes: No results for input(s): CKTOTAL, CKMB, CKMBINDEX, TROPONINI in the last 168 hours. BNP (last 3 results) No results for input(s): PROBNP in the last 8760 hours. HbA1C: No results for input(s): HGBA1C in the last 72 hours. CBG: Recent Labs  Lab 11/25/18 1636 11/25/18 2208 11/26/18 0816 11/26/18 1222  GLUCAP 209* 152* 148* 211*   Lipid Profile: No results for input(s): CHOL, HDL, LDLCALC, TRIG, CHOLHDL, LDLDIRECT in the last 72 hours. Thyroid Function Tests: No results for input(s): TSH, T4TOTAL, FREET4, T3FREE, THYROIDAB in the last 72 hours. Anemia Panel: No results for input(s): VITAMINB12, FOLATE, FERRITIN, TIBC, IRON, RETICCTPCT in the last 72 hours. Sepsis Labs: Recent Labs  Lab 10/29/2018 1723 11/25/18 0634  LATICACIDVEN 2.5* 2.3*    Recent Results (from the past 240 hour(s))  SARS CORONAVIRUS 2 (TAT 6-24 HRS) Nasopharyngeal Nasopharyngeal Swab     Status: None   Collection Time: 11/06/2018  3:10 PM   Specimen: Nasopharyngeal Swab  Result Value Ref Range Status   SARS Coronavirus 2 NEGATIVE NEGATIVE Final    Comment: (NOTE) SARS-CoV-2 target nucleic acids are NOT DETECTED. The SARS-CoV-2 RNA is generally detectable in upper and lower respiratory specimens during the acute phase of infection. Negative results do not preclude SARS-CoV-2 infection, do not rule out co-infections with other pathogens, and should not be used as the sole basis for treatment or other patient management decisions. Negative results must be combined with clinical  observations, patient history, and epidemiological information. The expected result is Negative. Fact Sheet for Patients: SugarRoll.be Fact Sheet for Healthcare Providers: https://www.woods-mathews.com/ This test is not yet approved or cleared by the [place] FDA and  has been authorized for detection and/or diagnosis of SARS-CoV-2 by FDA under an Emergency Use Authorization (EUA). This EUA will remain  in effect (meaning this test can be used) for the duration of the COVID-19 declaration under Section 56 4(b)(1) of the Act, 21 U.S.C. section 360bbb-3(b)(1), unless the authorization is terminated or revoked sooner. Performed at [facility], [place] [address], [place], [place] [address]          Radiology Studies: No results found.      Scheduled Meds: . collagenase   Topical Daily  . feeding supplement (PRO-STAT SUGAR FREE 64)  30 mL Oral BID  . gabapentin  100 mg Oral TID  . heparin  5,000 Units Subcutaneous Q8H  . insulin aspart  0-9 Units Subcutaneous TID WC  . insulin glargine  20 Units Subcutaneous QHS  . midodrine  15 mg Oral TID WC  .  sevelamer carbonate  800 mg Oral TID WC   Continuous Infusions: . ceFEPime (MAXIPIME) IV    . vancomycin       LOS: 2 days     Cordelia Poche, MD [facility] 11/26/2018, 3:23 PM  If 7PM-7AM, please contact night-coverage [email or web address]

## 2018-11-26 NOTE — Progress Notes (Signed)
This RN offered x 3 times to get pt dressing to sacral change but pt refused. Pt just wanted to be left alone, pt stated "Im just too tired"

## 2018-11-26 NOTE — Progress Notes (Addendum)
Assessed patient but patient refused to turn to assess back and sacral area, stating they will do the treatment later today. Will monitor

## 2018-11-26 NOTE — Progress Notes (Addendum)
  [facility] Progress Note   Subjective:  Seen in room, No complaints this am. Ate breakfast. Denies CP, SOB. For HD today   Objective Vitals:   11/25/18 2150 11/26/18 0054 11/26/18 0454 11/26/18 0626  BP: 110/65 93/64 (!) 83/65   Pulse:  79 79   Resp:  20 20   Temp:  97.8 F (36.6 C) (!) 97.5 F (36.4 C)   TempSrc:  Oral Oral   SpO2:  100% 99% 100%  Weight:        Physical Exam General: Obese male NAD, alert  Heart: RRR  Lungs: CTAB  Abdomen: soft NT +BS Extremities: L AKA trace stump edema  Dialysis Access: RUE AVF aneurysmal, pulsatile bruit    Weight change:    Additional Objective Labs: Basic Metabolic Panel: Recent Labs  Lab 11/23/2018 1722 11/25/18 0634 11/26/18 0236  NA 134* 132* 132*  K 4.2 4.5 5.1  CL 96* 95* 95*  CO2 26 22 22   GLUCOSE 162* 133* 141*  BUN 32* 41* 49*  CREATININE 3.11* 3.34* 4.02*  CALCIUM 6.9* 7.0* 6.7*  PHOS  --   --  3.2   CBC: Recent Labs  Lab 11/18/2018 1722 11/25/18 0634 11/26/18 0236  WBC 10.9* 11.2* 10.1  NEUTROABS 9.6*  --  8.6*  HGB 9.8* 10.3* 9.7*  HCT 35.4* 38.1* 33.7*  MCV 91.0 92.3 88.9  PLT 152 133* 127*   Blood Culture    Component Value Date/Time   SDES BLOOD LEFT ARM 12/28/2016 0305   SPECREQUEST IN PEDIATRIC BOTTLE Blood Culture adequate volume 12/28/2016 0305   CULT NO GROWTH 5 DAYS 12/28/2016 0305   REPTSTATUS 01/02/2017 FINAL 12/28/2016 0305     Medications: . ceFEPime (MAXIPIME) IV    . vancomycin     . collagenase   Topical Daily  . gabapentin  100 mg Oral TID  . heparin  5,000 Units Subcutaneous Q8H  . insulin aspart  0-9 Units Subcutaneous TID WC  . insulin glargine  20 Units Subcutaneous QHS  . midodrine  15 mg Oral TID WC  . sevelamer carbonate  800 mg Oral TID WC    Dialysis Orders:  [facility] on [address]  TTS 3h 22min EDW 250 lbs (113kg)  2K/2.5Ca Heparin none Access R AVF No VDRA Mircera 200 last 8/27   Assessment/Plan: 1. Stage IV sacral decubitus ulcer  - Limited mobility. Mostly wheelchair/bedbound. s/p bedside debridement per surgery 9/28. On Vanc, cefepime - per primary.  2. ESRD -  HD TTS. Continue on schedule. Next HD 9/29.  3. Hypotension/Volume On midodrine for BP support. At EDW by weights here. UF 1-2L as tolerated 4. Anemia  - Hgb 10.1. On ESA as outpatient  5. Metabolic bone disease -  Corr Ca 8.4. No Vit D as outpatient. Continue binders.  6. Nutrition - Prot supp for low albumin  7. DM -insulin per primary  8. S/p cystectomy with urostomy   Lynnda Child PA-C [place] Pager [phone] 11/26/2018,12:01 PM  LOS: 2 days   Pt seen, examined and agree w A/P as above.  Kelly Splinter  MD 11/26/2018, 1:11 PM

## 2018-11-26 NOTE — Plan of Care (Signed)
?  Problem: Elimination: ?Goal: Will not experience complications related to bowel motility ?Outcome: Progressing ?  ?Problem: Pain Managment: ?Goal: General experience of comfort will improve ?Outcome: Progressing ?  ?Problem: Safety: ?Goal: Ability to remain free from injury will improve ?Outcome: Progressing ?  ?

## 2018-11-26 NOTE — Progress Notes (Signed)
Physical Therapy Wound Treatment Patient Details  Name: [name] MRN: [id number] Date of Birth: 08/07/1955  Today's Date: 11/26/2018 Time:  -     Subjective  Subjective: Pt anxious and reports he is not looking forward to hydrotherapy. Wife entered about 1/2 way through session. Patient and Family Stated Goals: None stated.  Date of Onset: (Unknown) Prior Treatments: Bedside debridement 9/28  Pain Score:  Pt reporting significant pain throughout session, however faces pain scale was 4/10. Discussed premedication tomorrow with PA.   Wound Assessment  Wound Image   11/26/18 1327  Dressing Type ABD;Barrier Film (skin prep);Gauze (Comment);Moist to dry 11/26/18 1327  Dressing Changed Changed 11/26/18 1327  Dressing Status Clean;Dry;Intact 11/26/18 1327  Dressing Change Frequency Daily 11/26/18 1327  Site / Wound Assessment Black 11/26/18 1327  % Wound base Red or Granulating 5% 11/26/18 1327  % Wound base Yellow/Fibrinous Exudate 0% 11/26/18 1327  % Wound base Black/Eschar 95% 11/26/18 1327  % Wound base Other/Granulation Tissue (Comment) 0% 11/26/18 1327  Peri-wound Assessment Intact;Pink 11/26/18 1327  Wound Length (cm) 15 cm 11/26/18 1000  Wound Width (cm) 10 cm 11/26/18 1000  Wound Depth (cm) 3 cm 11/26/18 1000  Wound Volume (cm^3) 450 cm^3 11/26/18 1000  Wound Surface Area (cm^2) 150 cm^2 11/26/18 1000  Undermining (cm) 2.8 cm from about 6:00-12:00 11/26/18 1000  Margins Unattached edges (unapproximated) 11/26/18 1327  Closure None 11/26/18 1327  Drainage Amount Moderate 11/26/18 1327  Drainage Description Purulent 11/26/18 1327  Treatment Debridement (Selective);Hydrotherapy (Pulse lavage);Packing (Saline gauze) 11/26/18 1327      Hydrotherapy Pulsed lavage therapy - wound location: Sacrum Pulsed Lavage with Suction (psi): 8 psi Pulsed Lavage with Suction - Normal Saline Used: 1000 mL Pulsed Lavage Tip: Tip with splash shield Selective Debridement Selective  Debridement - Location: Sacrum Selective Debridement - Tools Used: Forceps;Scalpel Selective Debridement - Tissue Removed: black unviable tissue   Wound Assessment and Plan  Wound Therapy - Assess/Plan/Recommendations Wound Therapy - Clinical Statement: Pt presents to hydrotherapy s/p bedside debridement by surgical team 9/28. Pt anxious, complained of pain, and difficulty breathing throughout session. Discussed with Brooke, PA after session for addition of pain medication and Santyl for next session. Emphasized with pt the importance of letting nursing staff change his sacral dressing between hydrotherapy sessions, and set the expectation that hydrotherapy will be present 6 days a week for treatment. This patient will benefit from continued pulsed lavage and selective debridement of necrotic tissue to decrease bioburden and promote wound bed healing.  Wound Therapy - Functional Problem List: Decreased mobility, AKA, global weakness.  Factors Delaying/Impairing Wound Healing: Diabetes Mellitus;Immobility;Multiple medical problems Hydrotherapy Plan: Debridement;Dressing change;Patient/family education;Pulsatile lavage with suction Wound Therapy - Frequency: 6X / week Wound Therapy - Follow Up Recommendations: Skilled nursing facility Wound Plan: See above  Wound Therapy Goals- Improve the function of patient's integumentary system by progressing the wound(s) through the phases of wound healing (inflammation - proliferation - remodeling) by: Decrease Necrotic Tissue to: 20% Decrease Necrotic Tissue - Progress: Goal set today Increase Granulation Tissue to: 80% Increase Granulation Tissue - Progress: Goal set today Goals/treatment plan/discharge plan were made with and agreed upon by patient/family: Yes Time For Goal Achievement: 7 days Wound Therapy - Potential for Goals: Fair  Goals will be updated until maximal potential achieved or discharge criteria met.  Discharge criteria: when goals  achieved, discharge from hospital, MD decision/surgical intervention, no progress towards goals, refusal/missing three consecutive treatments without notification or medical reason.  GP       Thelma Comp 11/26/2018, 1:37 PM   Rolinda Roan, PT, DPT Acute Rehabilitation Services Pager: [phone] Office: [phone]

## 2018-11-27 ENCOUNTER — Ambulatory Visit: Payer: Medicare Other | Admitting: Surgery

## 2018-11-27 ENCOUNTER — Encounter (HOSPITAL_COMMUNITY): Payer: Self-pay | Admitting: Radiology

## 2018-11-27 ENCOUNTER — Inpatient Hospital Stay (HOSPITAL_COMMUNITY): Payer: Medicare Other

## 2018-11-27 DIAGNOSIS — I959 Hypotension, unspecified: Secondary | ICD-10-CM

## 2018-11-27 LAB — GLUCOSE, CAPILLARY
Glucose-Capillary: 155 mg/dL — ABNORMAL HIGH (ref 70–99)
Glucose-Capillary: 192 mg/dL — ABNORMAL HIGH (ref 70–99)
Glucose-Capillary: 136 mg/dL — ABNORMAL HIGH (ref 70–99)
Glucose-Capillary: 119 mg/dL — ABNORMAL HIGH (ref 70–99)

## 2018-11-27 MED ORDER — HYDROMORPHONE HCL 1 MG/ML IJ SOLN
1.0000 mg | Freq: Once | INTRAMUSCULAR | Status: AC
  Administered 2018-11-27: 23:00:00 1 mg via INTRAVENOUS
  Filled 2018-11-27: qty 1

## 2018-11-27 MED ORDER — MAGNESIUM SULFATE 2 GM/50ML IV SOLN
2.0000 g | Freq: Once | INTRAVENOUS | Status: AC
  Administered 2018-11-27: 12:00:00 2 g via INTRAVENOUS
  Filled 2018-11-27: qty 50

## 2018-11-27 MED ORDER — CHLORHEXIDINE GLUCONATE CLOTH 2 % EX PADS
6.0000 | MEDICATED_PAD | Freq: Every day | CUTANEOUS | Status: DC
  Administered 2018-11-28 – 2018-11-29 (×2): 6 via TOPICAL

## 2018-11-27 MED ORDER — IOHEXOL 300 MG/ML  SOLN
100.0000 mL | Freq: Once | INTRAMUSCULAR | Status: AC | PRN
  Administered 2018-11-27: 02:00:00 100 mL via INTRAVENOUS

## 2018-11-27 NOTE — Progress Notes (Signed)
  [name] Progress Note   Subjective: seen in room, c/o back pain  Objective Vitals:   11/26/18 1830 11/26/18 1850 11/26/18 2237 11/27/18 0637  BP: (!) 84/50 (!) 105/49 (!) 83/59 (!) 95/53  Pulse: 72 72 74 78  Resp: 18 18 19 19   Temp:  98 F (36.7 C) 98 F (36.7 C) 98 F (36.7 C)  TempSrc:  Oral Oral Oral  SpO2: 98% 97% 98% 100%  Weight:  112.4 kg      Physical Exam General: Obese male NAD, alert  Heart: RRR  Lungs: CTAB  Abdomen: soft NT +BS Extremities: L AKA trace stump edema  Dialysis Access: RUE AVF aneurysmal, pulsatile bruit    Dialysis Orders:  [facility] on [address]  TTS   3h 35min   113kg (250 lbs)    2/2.5 bath  Heparin none   R AVF No VDRA Mircera 200 last 8/27   Assessment/Plan: 1. Stage IV sacral decubitus ulcer - Limited mobility. Mostly wheelchair/bedbound. S/P bedside debridement per surgery 9/28. On Vanc, cefepime - per primary.  2. ESRD -  HD TTS. Continue on schedule. Next HD 10/1.  3. Hypotension/Volume On midodrine for BP support. At EDW by weights here. UF 1-2L as tolerated 4. Anemia  - Hgb 10.1. On ESA as outpatient  5. Metabolic bone disease -  Corr Ca 8.4. No Vit D as outpatient. Continue binders.  6. Nutrition - Prot supp for low albumin  7. DM -insulin per primary  8. S/p cystectomy with urostomy    Kelly Splinter  MD 11/27/2018, 12:19 PM  Weight change:    Additional Objective Labs: Basic Metabolic Panel: Recent Labs  Lab 11/01/2018 1722 11/25/18 0634 11/26/18 0236  NA 134* 132* 132*  K 4.2 4.5 5.1  CL 96* 95* 95*  CO2 26 22 22   GLUCOSE 162* 133* 141*  BUN 32* 41* 49*  CREATININE 3.11* 3.34* 4.02*  CALCIUM 6.9* 7.0* 6.7*  PHOS  --   --  3.2   CBC: Recent Labs  Lab 11/09/2018 1722 11/25/18 0634 11/26/18 0236  WBC 10.9* 11.2* 10.1  NEUTROABS 9.6*  --  8.6*  HGB 9.8* 10.3* 9.7*  HCT 35.4* 38.1* 33.7*  MCV 91.0 92.3 88.9  PLT 152 133* 127*   Blood Culture    Component Value Date/Time   SDES  BLOOD LEFT ARM 12/28/2016 0305   SPECREQUEST IN PEDIATRIC BOTTLE Blood Culture adequate volume 12/28/2016 0305   CULT NO GROWTH 5 DAYS 12/28/2016 0305   REPTSTATUS 01/02/2017 FINAL 12/28/2016 0305     Medications: . ceFEPime (MAXIPIME) IV    . magnesium sulfate bolus IVPB    . vancomycin 1,000 mg (11/26/18 1702)   . collagenase   Topical Daily  . feeding supplement (PRO-STAT SUGAR FREE 64)  30 mL Oral BID  . gabapentin  100 mg Oral TID  . heparin  5,000 Units Subcutaneous Q8H  . insulin aspart  0-9 Units Subcutaneous TID WC  . insulin glargine  20 Units Subcutaneous QHS  . levothyroxine  125 mcg Oral Q0600  . midodrine  15 mg Oral TID WC  . sevelamer carbonate  800 mg Oral TID WC

## 2018-11-27 NOTE — Progress Notes (Signed)
[facility] Progress Note     Subjective: CC-  PT in room for hydrotherapy. Patient uncomfortable.  Objective: Vital signs in last 24 hours: Temp:  [97.5 F (36.4 C)-98 F (36.7 C)] 97.5 F (36.4 C) (09/30 1505) Pulse Rate:  [70-80] 80 (09/30 1505) Resp:  [18-20] 20 (09/30 1505) BP: (83-105)/(40-67) 92/67 (09/30 1505) SpO2:  [97 %-100 %] 100 % (09/30 1505) Weight:  [112.4 kg] 112.4 kg (09/29 1850) Last BM Date: 11/25/18  Intake/Output from previous day: 09/29 0701 - 09/30 0700 In: 920 [P.O.:720; IV Piggyback:200] Out: 1000  Intake/Output this shift: No intake/output data recorded.  PE: Gen:  Alert, NAD HEENT: EOM's intact, pupils equal and round Pulm:  Rate and effort normal Abd: Soft, NT/ND Ext:  S/p L AKA Psych: A&Ox3  Skin: warm and dry GU:      Lab Results:  Recent Labs    11/25/18 0634 11/26/18 0236  WBC 11.2* 10.1  HGB 10.3* 9.7*  HCT 38.1* 33.7*  PLT 133* 127*   BMET Recent Labs    11/25/18 0634 11/26/18 0236  NA 132* 132*  K 4.5 5.1  CL 95* 95*  CO2 22 22  GLUCOSE 133* 141*  BUN 41* 49*  CREATININE 3.34* 4.02*  CALCIUM 7.0* 6.7*   PT/INR Recent Labs    11/20/2018 1722  LABPROT 15.0  INR 1.2   CMP     Component Value Date/Time   NA 132 (L) 11/26/2018 0236   NA 136 (A) 04/29/2015   NA 140 03/10/2008 1321   K 5.1 11/26/2018 0236   K 4.1 03/10/2008 1321   CL 95 (L) 11/26/2018 0236   CL 99 03/10/2008 1321   CO2 22 11/26/2018 0236   CO2 30 03/10/2008 1321   GLUCOSE 141 (H) 11/26/2018 0236   GLUCOSE 163 (H) 03/10/2008 1321   BUN 49 (H) 11/26/2018 0236   BUN 15 04/29/2015   BUN 14 03/10/2008 1321   CREATININE 4.02 (H) 11/26/2018 0236   CREATININE 5.73 (H) 07/17/2013 1557   CALCIUM 6.7 (L) 11/26/2018 0236   CALCIUM 9.4 03/10/2008 1321   PROT 6.4 (L) 11/25/2018 0634   PROT 6.9 03/10/2008 1321   ALBUMIN 1.8 (L) 11/26/2018 0236   ALBUMIN 3.4 03/10/2008 1321   AST 12 (L) 11/25/2018 0634   AST 21 03/10/2008 1321     ALT 11 11/25/2018 0634   ALT 20 03/10/2008 1321   ALKPHOS 206 (H) 11/25/2018 0634   ALKPHOS 69 03/10/2008 1321   BILITOT 1.2 11/25/2018 0634   BILITOT 0.80 03/10/2008 1321   GFRNONAA 15 (L) 11/26/2018 0236   GFRNONAA 23 (L) 03/07/2013 1719   GFRAA 17 (L) 11/26/2018 0236   GFRAA 26 (L) 03/07/2013 1719   Lipase     Component Value Date/Time   LIPASE 11 10/10/2012 1306       Studies/Results: Dg Sacrum/coccyx  Result Date: 11/26/2018 CLINICAL DATA:  Sacral decubitus ulcer, stage IV, pain about the sacrum. EXAM: SACRUM AND COCCYX - 2+ VIEW COMPARISON:  CT 09/26/2018 FINDINGS: AP radiographs are suboptimal due to patient rotation. Furthermore there is limited evaluation of the sacrum and coccyx due to patient body habitus and diffuse bony demineralization. No gross abnormalities identified though certainly nondisplaced fractures may be present. 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 Vascular calcium is noted in the pelvis. There is soft tissue a ulceration noted posteriorly to the sacrum which appears to extend to the level of the bone. IMPRESSION: Significantly degraded images of the sacrum and coccyx due  to patient body habitus, motion and diffuse bony demineralization. Sacral ulceration extends to the level of the bone however evaluation is markedly limited. Consider contrast-enhanced cross-sectional imaging for further evaluation of the sacral decubitus ulcer. Electronically Signed   By: Lovena Le M.D.   On: 11/26/2018 22:39   Ct Abdomen Pelvis W Contrast  Result Date: 11/27/2018 CLINICAL DATA:  Acute generalized abdominal pain, recent hospitalization with perihepatic abscess requiring drain placement in antibiotic EXAM: CT ABDOMEN AND PELVIS WITH CONTRAST TECHNIQUE: Multidetector CT imaging of the abdomen and pelvis was performed using the standard protocol following bolus administration of intravenous contrast. CONTRAST:  157mL OMNIPAQUE IOHEXOL 300 MG/ML  SOLN COMPARISON:   Numerous priors, most recent comparison 09/26/2018 FINDINGS: Portions of the abdomen are excluded from the level of imaging due to patient's size/body habitus. Lower chest: New consolidation in the left lower lobe with air bronchograms. Persistent atelectatic collapse of the right lower lobe. Moderate right and small left pleural effusions with adjacent passive atelectasis. Redemonstrated cardiomegaly. Extensive coronary artery calcification is again seen. Hepatobiliary: Small portion of the right heart border is beyond the level of collimation. No focal liver lesion is seen. There is been interval removal of the previously demonstrated pigtail pleural drain with a small residual collection along the posterior border of the right lobe liver measuring up to 1.8 x 3.8 x 2.5 cm in size and demonstrating a small amount of peripheral rim enhancement (axial 35, coronal 68). Patient is post cholecystectomy. Extent biliary ductal dilatation is similar to prior exam and likely related to the a combination of age and reservoir effect. Pancreas: Unremarkable. No pancreatic ductal dilatation or surrounding inflammatory changes. Spleen: Borderline splenomegaly, no splenic lesions. Adrenals/Urinary Tract: Adrenal glands are unremarkable. There is severe bilateral renal atrophy with bilateral renal cysts, similar to prior. Nonobstructive nephrolithiasis in the lower pole right kidney is similar to prior. There is persistent periureteral stranding and urothelial thickening similar to slightly increased from comparison exams. Patient is post cystectomy and ileal conduit diversion. Mild stranding is seen adjacent the ileal conduit. Stomach/Bowel: Distal esophagus, stomach and duodenal sweep are unremarkable. No bowel wall thickening or dilatation. No evidence of obstruction. Appendix is present in the right lower quadrant with a stable amount of fluid adjacent likely tracking from the paracolic gutter. No discrete periappendiceal  inflammation. Portion of the colon is partially collimated from view. No visible colonic thickening or dilatation. Scattered colonic diverticula without focal pericolonic inflammation to suggest diverticulitis. The distal sigmoid protrudes into a bowel containing right inguinal hernia. No resulting obstruction. Vascular/Lymphatic: Atherosclerotic plaque within the normal caliber aorta. Reactive nodes are present in the mesentery and retroperitoneum not significantly changed from recent comparison studies. Reproductive: Pelvis is largely obscured by streak artifact from patient's bilateral hip arthroplasties. Few coarse prostatic calcifications are visualized, similar to prior. Other: Extensive abdominal wall laxity. Circumferential body wall edema, similar to prior. Large fat and bowel containing right inguinal hernia. Additional large fat and fluid containing left inguinal hernia as well. Musculoskeletal: Bilateral total hip arthroplasties. Severe degenerative changes in the spine with multilevel compression deformities similar to prior studies no definite acute osseous abnormality is evident. Diffuse atrophy of the abdominal wall and pelvic musculature. IMPRESSION: 1. Portions of the abdomen are excluded from the level of imaging due to patient's size/body habitus. 2. Interval removal of the previously demonstrated pigtail pleural drain with a small residual collection along the posterior border of the right lobe liver measuring up to 1.8 x 3.8 x 2.5 cm  in size, with a small amount of peripheral rim enhancement. Findings are concerning for residual abscess or infection. 3. Persistent periureteral stranding and urothelial thickening, similar to slightly increased from prior exams. Correlate with urinalysis to exclude ascending infection. 4. The distal sigmoid protrudes into a bowel containing right inguinal hernia. No resulting obstruction or CT evidence of vascular compromise. 5. New consolidation in the left  lower lobe with air bronchograms, could reflect pneumonia or sequela of aspiration. 6. Moderate right and small left pleural effusions with adjacent passive atelectasis. 7. Worsening body wall edema, compatible with anasarca. 8. Aortic Atherosclerosis (ICD10-I70.0). Electronically Signed   By: Lovena Le M.D.   On: 11/27/2018 02:05    Anti-infectives: Anti-infectives (From admission, onward)   Start     Dose/Rate Route Frequency Ordered Stop   11/26/18 1800  ceFEPIme (MAXIPIME) 2 g in sodium chloride 0.9 % 100 mL IVPB     2 g 200 mL/hr over 30 Minutes Intravenous Every T-Th-Sa (1800) 11/25/18 0916     11/26/18 1659  vancomycin (VANCOCIN) 1-5 GM/200ML-% IVPB    Note to Pharmacy: Cherylann Banas   : cabinet override      11/26/18 1659 11/26/18 2037   11/26/18 1200  vancomycin (VANCOCIN) IVPB 1000 mg/200 mL premix     1,000 mg 200 mL/hr over 60 Minutes Intravenous Every T-Th-Sa (Hemodialysis) 11/09/2018 1916     11/25/18 0945  ceFEPIme (MAXIPIME) 2 g in sodium chloride 0.9 % 100 mL IVPB     2 g 200 mL/hr over 30 Minutes Intravenous STAT 11/25/18 0915 11/25/18 1039   11/07/2018 1930  vancomycin (VANCOCIN) 2,500 mg in sodium chloride 0.9 % 500 mL IVPB     2,500 mg 250 mL/hr over 120 Minutes Intravenous  Once 11/22/2018 1847 11/16/2018 2342       Assessment/Plan DM ESRD - TTS Hypotension Hypothyroidism HLD S/p Left AKA  Sacral wound - Sacral wound cleaning up slowly. Continue hydrotherapy. Continue santyl and BID wet to to dry dressing changes.  ID - maxipime/vancomycin 9/27>> VTE - SCDs, sq heparin FEN - CM/HH diet Foley - urostomy Follow up - TBD    LOS: 3 days    Wellington Hampshire , [facility] 11/27/2018, 4:17 PM Pager: [phone] Mon-Thurs 7:00 am-4:30 pm Fri 7:00 am -11:30 AM Sat-Sun 7:00 am-11:30 am

## 2018-11-27 NOTE — Progress Notes (Signed)
Physical Therapy Wound Treatment Patient Details  Name: [name] MRN: [id number] Date of Birth: 1955/11/12  Today's Date: 11/27/2018 Time: 1027-1116 Time Calculation (min): 49 min  Subjective  Subjective: Anxious and continuously asking how much longer throughout treatment. Patient and Family Stated Goals: None stated.  Date of Onset: (Unknown) Prior Treatments: Bedside debridement 9/28  Pain Score:  Premedicated with morphine. Complaining of pain everywhere but sacrum.   Wound Assessment  Wound / Incision (Open or Dehisced) 11/25/18 Other (Comment) Sacrum Mid (Active)  Dressing Type Other (Comment);ABD;Barrier Film (skin prep);Gauze (Comment);Moist to dry 11/27/18 1440  Dressing Changed Changed 11/27/18 1440  Dressing Status Clean;Dry;Intact 11/27/18 1440  Dressing Change Frequency Daily 11/27/18 1440  Site / Wound Assessment Black;Red 11/27/18 1440  % Wound base Red or Granulating 10% 11/27/18 1440  % Wound base Yellow/Fibrinous Exudate 0% 11/27/18 1440  % Wound base Black/Eschar 90% 11/27/18 1440  % Wound base Other/Granulation Tissue (Comment) 0% 11/27/18 1440  Peri-wound Assessment Intact;Pink 11/27/18 1440  Wound Length (cm) 15 cm 11/26/18 1000  Wound Width (cm) 10 cm 11/26/18 1000  Wound Depth (cm) 3 cm 11/26/18 1000  Wound Volume (cm^3) 450 cm^3 11/26/18 1000  Wound Surface Area (cm^2) 150 cm^2 11/26/18 1000  Undermining (cm) 2.8 cm from about 6:00-12:00 11/26/18 1000  Margins Unattached edges (unapproximated) 11/27/18 1440  Closure None 11/27/18 1440  Drainage Amount Moderate 11/27/18 1440  Drainage Description Purulent 11/27/18 1440  Treatment Debridement (Selective);Hydrotherapy (Pulse lavage);Packing (Saline gauze) 11/27/18 1440   Santyl applied to wound bed prior to applying dressing.     Hydrotherapy Pulsed lavage therapy - wound location: Sacrum Pulsed Lavage with Suction (psi): 8 psi Pulsed Lavage with Suction - Normal Saline Used: 1000 mL Pulsed  Lavage Tip: Tip with splash shield Selective Debridement Selective Debridement - Location: Sacrum Selective Debridement - Tools Used: Forceps;Scalpel;Scissors Selective Debridement - Tissue Removed: black unviable tissue   Wound Assessment and Plan  Wound Therapy - Assess/Plan/Recommendations Wound Therapy - Clinical Statement: Brooke, PA present to assess wound with PT. A significant amount of debridement was able to be completed today however a significant amount still remains. Santyl added this session. Pt reporting pain in every body part except for sacrum - had complaints about breathing, chest, back, abdomen, arms and legs. Had morphine prior to session beginning. Will benefit from continued hydrotherapy for debridement, to decrease bioburden and promote wound bed healing.  Wound Therapy - Functional Problem List: Decreased mobility, AKA, global weakness.  Factors Delaying/Impairing Wound Healing: Diabetes Mellitus;Immobility;Multiple medical problems Hydrotherapy Plan: Debridement;Dressing change;Patient/family education;Pulsatile lavage with suction Wound Therapy - Frequency: 6X / week Wound Therapy - Follow Up Recommendations: Skilled nursing facility Wound Plan: See above  Wound Therapy Goals- Improve the function of patient's integumentary system by progressing the wound(s) through the phases of wound healing (inflammation - proliferation - remodeling) by: Decrease Necrotic Tissue to: 20% Decrease Necrotic Tissue - Progress: Progressing toward goal Increase Granulation Tissue to: 80% Increase Granulation Tissue - Progress: Progressing toward goal Goals/treatment plan/discharge plan were made with and agreed upon by patient/family: Yes Time For Goal Achievement: 7 days Wound Therapy - Potential for Goals: Fair  Goals will be updated until maximal potential achieved or discharge criteria met.  Discharge criteria: when goals achieved, discharge from hospital, MD decision/surgical  intervention, no progress towards goals, refusal/missing three consecutive treatments without notification or medical reason.  GP     Thelma Comp 11/27/2018, 2:46 PM  Rolinda Roan, PT, DPT Acute Rehabilitation Services Pager: [phone]  Office: [phone]

## 2018-11-27 NOTE — Progress Notes (Signed)
Hydrotherapy done by PT this morning. Pt now on an air mattress and low bed

## 2018-11-27 NOTE — Progress Notes (Signed)
PROGRESS NOTE    [name]  [id number] DOB: Mar 04, 1955 DOA: 11/18/2018 PCP: [facility]   Brief Narrative:  HPI on 11/22/2018 by Dr. Harrold Donath is a 63 y.o. male with medical history significant of end-stage renal disease on hemodialysis TTS, peripheral vascular disease status post left AKA, right femoral fracture, diabetes, morbid obesity, hypertension, hyperlipidemia presenting to the ER with ulcer in his sacral.  Patient is largely wheelchair and bedbound.  He also spends at least 4 hours at dialysis 3 times a week therefore putting a lot of pressure on his sacral area.  He developed a small ulcer apparently about 4 weeks ago which has now progressed to stage IV sacral decubitus ulcer.  He came in with a pain as well as discharge from the area.  Patient was seen in the ER and evaluated and help to have what appears to be infected decubitus ulcer.  He has some eschar over it.  Has not used anything prior to now.  Denied any fever or chills no nausea vomiting or diarrhea.  Patient is being admitted for management of his decubitus ulcer which may require debridement and aggressive wound care.   Interim history  Patient admitted for sacral decub ulcer. Currently on vancomycin and cefepime and undergoing hydrotherapy. General surgery consulted.  Assessment & Plan   Sacral decubitus ulcer -Stage IV and infected, noted on admission -patient mostly wheelchair/bedbound -S/p bedside debridement 11/25/2018 by general surgery -Continue vancomycin and cefepime -Continue hydrotherapy -No cultures obtained -No leukocytosis, patient currently afebrile. However, CRP 21.9 -X-ray of sacrum/coccyx showed diffuse bony demineralization.  Sacral ulceration extends to the level of the bone however evaluation is markedly limited.   Abdominal pain -Recently hospitalized and had perihepatic abscess requiring drain placement and antibiotics -CT abdomen/pelvis small  residual collection along the posterior border of the right liver lobe measuring 1.8 x 3.8 x 2.5 cm in size.  Is concerning for residual abscess or infection.  Persistent periureteral stranding increased from prior exams. Distal sigmoid protrudes into a bowel containing right inguinal hernia, no obstruction. New consolidation LLL.   ESRD on HD -Nephrology consulted and appreciated -Patient dialyzes Tuesday, Thursday, Saturday  Diabetes mellitus, type II -Continue insulin sliding scale, Lantus, CBG monitoring  Chronic hypertension -Continue midodrine  Hypothyroidism -Continue Synthroid  Hyperlipidemia -Continue statin  Neuropathy -Continue gabapentin  Hypomagnesemia -Will continue to monitor and replace as needed  Chronic normocytic anemia -Hemoglobin currently 9.7, appears to be close to baseline  Obesity -BMI 33.91 -Status post AKA, likely BMI underestimated -Need to follow-up with PCP to discuss lifestyle modifications and interventions  DVT Prophylaxis  heparin  Code Status: Full  Family Communication: None at bedside  Disposition Plan: Admitted. Pending improvement of wound with hydrotherapy. Continues to need IV antibiotics.   Consultants General surgery Nephrology   Procedures  Bedside I&D  Antibiotics   Anti-infectives (From admission, onward)   Start     Dose/Rate Route Frequency Ordered Stop   11/26/18 1800  ceFEPIme (MAXIPIME) 2 g in sodium chloride 0.9 % 100 mL IVPB     2 g 200 mL/hr over 30 Minutes Intravenous Every T-Th-Sa (1800) 11/25/18 0916     11/26/18 1659  vancomycin (VANCOCIN) 1-5 GM/200ML-% IVPB    Note to Pharmacy: Cherylann Banas   : cabinet override      11/26/18 1659 11/26/18 2037   11/26/18 1200  vancomycin (VANCOCIN) IVPB 1000 mg/200 mL premix     1,000 mg 200 mL/hr over  60 Minutes Intravenous Every T-Th-Sa (Hemodialysis) 11/22/2018 1916     11/25/18 0945  ceFEPIme (MAXIPIME) 2 g in sodium chloride 0.9 % 100 mL IVPB     2 g 200 mL/hr  over 30 Minutes Intravenous STAT 11/25/18 0915 11/25/18 1039   11/23/2018 1930  vancomycin (VANCOCIN) 2,500 mg in sodium chloride 0.9 % 500 mL IVPB     2,500 mg 250 mL/hr over 120 Minutes Intravenous  Once 11/12/2018 1847 11/08/2018 2342      Subjective:   [name] seen and examined today.  Patient complains of feeling very sleepy this morning.  States he had a CT scan done early this morning.  Denies current chest pain, shortness of breath, abdominal pain.  Does have pain in his bottom.  Does not wish to speak anymore, would like to go back to sleep.  Objective:   Vitals:   11/26/18 1830 11/26/18 1850 11/26/18 2237 11/27/18 0637  BP: (!) 84/50 (!) 105/49 (!) 83/59 (!) 95/53  Pulse: 72 72 74 78  Resp: 18 18 19 19   Temp:  98 F (36.7 C) 98 F (36.7 C) 98 F (36.7 C)  TempSrc:  Oral Oral Oral  SpO2: 98% 97% 98% 100%  Weight:  112.4 kg      Intake/Output Summary (Last 24 hours) at 11/27/2018 0824 Last data filed at 11/27/2018 0300 Gross per 24 hour  Intake 920 ml  Output 1000 ml  Net -80 ml   Filed Weights   11/15/2018 1905 11/26/18 1500 11/26/18 1850  Weight: 113.4 kg 113.4 kg 112.4 kg    Exam  General: Well developed, well nourished, NAD, appears stated age  HEENT: NCAT, mucous membranes moist.   Neck: Supple  Cardiovascular: S1 S2 auscultated, RRR  Respiratory: Clear to auscultation bilaterally with equal chest rise  Abdomen: Soft, obese, nontender, nondistended, + bowel sounds  Extremities: warm dry without cyanosis clubbing or edema of RLE. L AKA with trace stump edema  Neuro: AAOx3, nonfocal  Psych: Normal affect and demeanor with intact judgement and insight   Data Reviewed: I have personally reviewed following labs and imaging studies  CBC: Recent Labs  Lab 11/05/2018 1722 11/25/18 0634 11/26/18 0236  WBC 10.9* 11.2* 10.1  NEUTROABS 9.6*  --  8.6*  HGB 9.8* 10.3* 9.7*  HCT 35.4* 38.1* 33.7*  MCV 91.0 92.3 88.9  PLT 152 133* 127*   Basic  Metabolic Panel: Recent Labs  Lab 11/12/2018 1722 11/25/18 0634 11/26/18 0236  NA 134* 132* 132*  K 4.2 4.5 5.1  CL 96* 95* 95*  CO2 26 22 22   GLUCOSE 162* 133* 141*  BUN 32* 41* 49*  CREATININE 3.11* 3.34* 4.02*  CALCIUM 6.9* 7.0* 6.7*  MG  --   --  1.5*  PHOS  --   --  3.2   GFR: CrCl cannot be calculated (Unknown ideal weight.). Liver Function Tests: Recent Labs  Lab 11/27/2018 1722 11/25/18 0634 11/26/18 0236  AST 12* 12*  --   ALT 10 11  --   ALKPHOS 198* 206*  --   BILITOT 1.1 1.2  --   PROT 6.2* 6.4*  --   ALBUMIN 2.2* 2.3* 1.8*   No results for input(s): LIPASE, AMYLASE in the last 168 hours. No results for input(s): AMMONIA in the last 168 hours. Coagulation Profile: Recent Labs  Lab 11/18/2018 1722  INR 1.2   Cardiac Enzymes: No results for input(s): CKTOTAL, CKMB, CKMBINDEX, TROPONINI in the last 168 hours. BNP (last 3 results) No  results for input(s): PROBNP in the last 8760 hours. HbA1C: No results for input(s): HGBA1C in the last 72 hours. CBG: Recent Labs  Lab 11/25/18 2208 11/26/18 0816 11/26/18 1222 11/26/18 2159 11/27/18 0816  GLUCAP 152* 148* 211* 232* 155*   Lipid Profile: No results for input(s): CHOL, HDL, LDLCALC, TRIG, CHOLHDL, LDLDIRECT in the last 72 hours. Thyroid Function Tests: No results for input(s): TSH, T4TOTAL, FREET4, T3FREE, THYROIDAB in the last 72 hours. Anemia Panel: No results for input(s): VITAMINB12, FOLATE, FERRITIN, TIBC, IRON, RETICCTPCT in the last 72 hours. Urine analysis:    Component Value Date/Time   COLORURINE AMBER (A) 12/27/2016 1601   APPEARANCEUR TURBID (A) 12/27/2016 1601   LABSPEC >1.046 (H) 12/27/2016 1601   PHURINE 5.0 12/27/2016 1601   GLUCOSEU NEGATIVE 12/27/2016 1601   HGBUR NEGATIVE 12/27/2016 1601   BILIRUBINUR NEGATIVE 12/27/2016 1601   BILIRUBINUR neg 08/10/2014 1637   KETONESUR NEGATIVE 12/27/2016 1601   PROTEINUR NEGATIVE 12/27/2016 1601   UROBILINOGEN 1.0 08/10/2014 1637    UROBILINOGEN 0.2 08/03/2014 1225   NITRITE NEGATIVE 12/27/2016 1601   LEUKOCYTESUR TRACE (A) 12/27/2016 1601   Sepsis Labs: @LABRCNTIP (procalcitonin:4,lacticidven:4)  ) Recent Results (from the past 240 hour(s))  SARS CORONAVIRUS 2 (TAT 6-24 HRS) Nasopharyngeal Nasopharyngeal Swab     Status: None   Collection Time: 11/11/2018  3:10 PM   Specimen: Nasopharyngeal Swab  Result Value Ref Range Status   SARS Coronavirus 2 NEGATIVE NEGATIVE Final    Comment: (NOTE) SARS-CoV-2 target nucleic acids are NOT DETECTED. The SARS-CoV-2 RNA is generally detectable in upper and lower respiratory specimens during the acute phase of infection. Negative results do not preclude SARS-CoV-2 infection, do not rule out co-infections with other pathogens, and should not be used as the sole basis for treatment or other patient management decisions. Negative results must be combined with clinical observations, patient history, and epidemiological information. The expected result is Negative. Fact Sheet for Patients: SugarRoll.be Fact Sheet for Healthcare Providers: https://www.woods-mathews.com/ This test is not yet approved or cleared by the [place] FDA and  has been authorized for detection and/or diagnosis of SARS-CoV-2 by FDA under an Emergency Use Authorization (EUA). This EUA will remain  in effect (meaning this test can be used) for the duration of the COVID-19 declaration under Section 56 4(b)(1) of the Act, 21 U.S.C. section 360bbb-3(b)(1), unless the authorization is terminated or revoked sooner. Performed at [facility], [place] [address] [place], [place] [address]       Radiology Studies: Dg Sacrum/coccyx  Result Date: 11/26/2018 CLINICAL DATA:  Sacral decubitus ulcer, stage IV, pain about the sacrum. EXAM: SACRUM AND COCCYX - 2+ VIEW COMPARISON:  CT 09/26/2018 FINDINGS: AP radiographs are suboptimal due to patient rotation. Furthermore  there is limited evaluation of the sacrum and coccyx due to patient body habitus and diffuse bony demineralization. No gross abnormalities identified though certainly nondisplaced fractures may be present. 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 Vascular calcium is noted in the pelvis. There is soft tissue a ulceration noted posteriorly to the sacrum which appears to extend to the level of the bone. IMPRESSION: Significantly degraded images of the sacrum and coccyx due to patient body habitus, motion and diffuse bony demineralization. Sacral ulceration extends to the level of the bone however evaluation is markedly limited. Consider contrast-enhanced cross-sectional imaging for further evaluation of the sacral decubitus ulcer. Electronically Signed   By: Lovena Le M.D.   On: 11/26/2018 22:39   Ct Abdomen Pelvis W Contrast  Result Date: 11/27/2018 CLINICAL DATA:  Acute generalized abdominal pain, recent hospitalization with perihepatic abscess requiring drain placement in antibiotic EXAM: CT ABDOMEN AND PELVIS WITH CONTRAST TECHNIQUE: Multidetector CT imaging of the abdomen and pelvis was performed using the standard protocol following bolus administration of intravenous contrast. CONTRAST:  135mL OMNIPAQUE IOHEXOL 300 MG/ML  SOLN COMPARISON:  Numerous priors, most recent comparison 09/26/2018 FINDINGS: Portions of the abdomen are excluded from the level of imaging due to patient's size/body habitus. Lower chest: New consolidation in the left lower lobe with air bronchograms. Persistent atelectatic collapse of the right lower lobe. Moderate right and small left pleural effusions with adjacent passive atelectasis. Redemonstrated cardiomegaly. Extensive coronary artery calcification is again seen. Hepatobiliary: Small portion of the right heart border is beyond the level of collimation. No focal liver lesion is seen. There is been interval removal of the previously demonstrated pigtail pleural drain with  a small residual collection along the posterior border of the right lobe liver measuring up to 1.8 x 3.8 x 2.5 cm in size and demonstrating a small amount of peripheral rim enhancement (axial 35, coronal 68). Patient is post cholecystectomy. Extent biliary ductal dilatation is similar to prior exam and likely related to the a combination of age and reservoir effect. Pancreas: Unremarkable. No pancreatic ductal dilatation or surrounding inflammatory changes. Spleen: Borderline splenomegaly, no splenic lesions. Adrenals/Urinary Tract: Adrenal glands are unremarkable. There is severe bilateral renal atrophy with bilateral renal cysts, similar to prior. Nonobstructive nephrolithiasis in the lower pole right kidney is similar to prior. There is persistent periureteral stranding and urothelial thickening similar to slightly increased from comparison exams. Patient is post cystectomy and ileal conduit diversion. Mild stranding is seen adjacent the ileal conduit. Stomach/Bowel: Distal esophagus, stomach and duodenal sweep are unremarkable. No bowel wall thickening or dilatation. No evidence of obstruction. Appendix is present in the right lower quadrant with a stable amount of fluid adjacent likely tracking from the paracolic gutter. No discrete periappendiceal inflammation. Portion of the colon is partially collimated from view. No visible colonic thickening or dilatation. Scattered colonic diverticula without focal pericolonic inflammation to suggest diverticulitis. The distal sigmoid protrudes into a bowel containing right inguinal hernia. No resulting obstruction. Vascular/Lymphatic: Atherosclerotic plaque within the normal caliber aorta. Reactive nodes are present in the mesentery and retroperitoneum not significantly changed from recent comparison studies. Reproductive: Pelvis is largely obscured by streak artifact from patient's bilateral hip arthroplasties. Few coarse prostatic calcifications are visualized, similar  to prior. Other: Extensive abdominal wall laxity. Circumferential body wall edema, similar to prior. Large fat and bowel containing right inguinal hernia. Additional large fat and fluid containing left inguinal hernia as well. Musculoskeletal: Bilateral total hip arthroplasties. Severe degenerative changes in the spine with multilevel compression deformities similar to prior studies no definite acute osseous abnormality is evident. Diffuse atrophy of the abdominal wall and pelvic musculature. IMPRESSION: 1. Portions of the abdomen are excluded from the level of imaging due to patient's size/body habitus. 2. Interval removal of the previously demonstrated pigtail pleural drain with a small residual collection along the posterior border of the right lobe liver measuring up to 1.8 x 3.8 x 2.5 cm in size, with a small amount of peripheral rim enhancement. Findings are concerning for residual abscess or infection. 3. Persistent periureteral stranding and urothelial thickening, similar to slightly increased from prior exams. Correlate with urinalysis to exclude ascending infection. 4. The distal sigmoid protrudes into a bowel containing right inguinal hernia. No resulting obstruction or CT evidence  of vascular compromise. 5. New consolidation in the left lower lobe with air bronchograms, could reflect pneumonia or sequela of aspiration. 6. Moderate right and small left pleural effusions with adjacent passive atelectasis. 7. Worsening body wall edema, compatible with anasarca. 8. Aortic Atherosclerosis (ICD10-I70.0). Electronically Signed   By: Lovena Le M.D.   On: 11/27/2018 02:05     Scheduled Meds: . collagenase   Topical Daily  . feeding supplement (PRO-STAT SUGAR FREE 64)  30 mL Oral BID  . gabapentin  100 mg Oral TID  . heparin  5,000 Units Subcutaneous Q8H  . insulin aspart  0-9 Units Subcutaneous TID WC  . insulin glargine  20 Units Subcutaneous QHS  . levothyroxine  125 mcg Oral Q0600  . midodrine   15 mg Oral TID WC  . sevelamer carbonate  800 mg Oral TID WC   Continuous Infusions: . ceFEPime (MAXIPIME) IV    . vancomycin 1,000 mg (11/26/18 1702)     LOS: 3 days   Time Spent in minutes   30 minutes  Temia Debroux D.O. on 11/27/2018 at 8:24 AM  Between 7am to 7pm - Please see pager noted on [email or web address]  After 7pm go to [email or web address]  And look for the night coverage person covering for me after hours  [facility] Office  [phone]

## 2018-11-27 NOTE — Progress Notes (Signed)
Patient refuses to be in low bed, stating he cannot breathe. O2 sat 100% on 4L. Patient yelling to be transferred to regular bed. Explained to patient the importance the air mattress bed and he states he refuses to be in the air mattress bed. Patient transferred x4 assist to regular bed.

## 2018-11-28 LAB — HEPATITIS PANEL, ACUTE
HCV Ab: NONREACTIVE
Hep A IgM: NONREACTIVE
Hep B C IgM: NONREACTIVE
Hepatitis B Surface Ag: NONREACTIVE

## 2018-11-28 LAB — BASIC METABOLIC PANEL
Anion gap: 14 (ref 5–15)
BUN: 48 mg/dL — ABNORMAL HIGH (ref 8–23)
CO2: 23 mmol/L (ref 22–32)
Calcium: 6.9 mg/dL — ABNORMAL LOW (ref 8.9–10.3)
Chloride: 93 mmol/L — ABNORMAL LOW (ref 98–111)
Creatinine, Ser: 3.59 mg/dL — ABNORMAL HIGH (ref 0.61–1.24)
GFR calc Af Amer: 20 mL/min — ABNORMAL LOW (ref >60)
GFR calc non Af Amer: 17 mL/min — ABNORMAL LOW (ref >60)
Glucose, Bld: 147 mg/dL — ABNORMAL HIGH (ref 70–99)
Potassium: 5.1 mmol/L (ref 3.5–5.1)
Sodium: 130 mmol/L — ABNORMAL LOW (ref 135–145)

## 2018-11-28 LAB — CBC
HCT: 33.1 % — ABNORMAL LOW (ref 39.0–52.0)
Hemoglobin: 9.4 g/dL — ABNORMAL LOW (ref 13.0–17.0)
MCH: 25.1 pg — ABNORMAL LOW (ref 26.0–34.0)
MCHC: 28.4 g/dL — ABNORMAL LOW (ref 30.0–36.0)
MCV: 88.5 fL (ref 80.0–100.0)
Platelets: 137 10*3/uL — ABNORMAL LOW (ref 150–400)
RBC: 3.74 MIL/uL — ABNORMAL LOW (ref 4.22–5.81)
RDW: 16.8 % — ABNORMAL HIGH (ref 11.5–15.5)
WBC: 11.7 10*3/uL — ABNORMAL HIGH (ref 4.0–10.5)
nRBC: 0.7 % — ABNORMAL HIGH (ref 0.0–0.2)

## 2018-11-28 LAB — GLUCOSE, CAPILLARY
Glucose-Capillary: 120 mg/dL — ABNORMAL HIGH (ref 70–99)
Glucose-Capillary: 131 mg/dL — ABNORMAL HIGH (ref 70–99)
Glucose-Capillary: 132 mg/dL — ABNORMAL HIGH (ref 70–99)

## 2018-11-28 LAB — HEPATITIS B CORE ANTIBODY, TOTAL: Hep B Core Total Ab: NONREACTIVE

## 2018-11-28 LAB — HEPATITIS B SURFACE ANTIGEN: Hepatitis B Surface Ag: NONREACTIVE

## 2018-11-28 LAB — MAGNESIUM: Magnesium: 1.9 mg/dL (ref 1.7–2.4)

## 2018-11-28 MED ORDER — VANCOMYCIN HCL IN DEXTROSE 1-5 GM/200ML-% IV SOLN
INTRAVENOUS | Status: AC
  Filled 2018-11-28: qty 200

## 2018-11-28 MED ORDER — LIDOCAINE HCL (PF) 1 % IJ SOLN: 5.0000 mL | INTRAMUSCULAR | Status: DC | PRN

## 2018-11-28 MED ORDER — SODIUM CHLORIDE 0.9 % IV SOLN: 100.0000 mL | INTRAVENOUS | Status: DC | PRN

## 2018-11-28 MED ORDER — DARBEPOETIN ALFA 100 MCG/0.5ML IJ SOSY
100.0000 ug | PREFILLED_SYRINGE | INTRAMUSCULAR | Status: DC
  Administered 2018-12-01: 100 ug via INTRAVENOUS
  Filled 2018-11-28: qty 0.5

## 2018-11-28 MED ORDER — HEPARIN SODIUM (PORCINE) 1000 UNIT/ML DIALYSIS: 1000.0000 [IU] | INTRAMUSCULAR | Status: DC | PRN

## 2018-11-28 MED ORDER — HYDROMORPHONE HCL 1 MG/ML IJ SOLN
1.0000 mg | INTRAMUSCULAR | Status: DC | PRN
  Administered 2018-11-28 – 2018-12-04 (×9): 1 mg via INTRAVENOUS
  Filled 2018-11-28 (×9): qty 1

## 2018-11-28 MED ORDER — LIDOCAINE-PRILOCAINE 2.5-2.5 % EX CREA: 1.0000 "application " | TOPICAL_CREAM | CUTANEOUS | Status: DC | PRN

## 2018-11-28 MED ORDER — PENTAFLUOROPROP-TETRAFLUOROETH EX AERO: 1.0000 "application " | INHALATION_SPRAY | CUTANEOUS | Status: DC | PRN

## 2018-11-28 MED ORDER — ALTEPLASE 2 MG IJ SOLR: 2.0000 mg | Freq: Once | INTRAMUSCULAR | Status: DC | PRN

## 2018-11-28 NOTE — Progress Notes (Signed)
Pharmacy Antibiotic Note  [name] is a 63 y.o. male admitted on 11/03/2018 with wound infection /decubitus ulcer.  Pharmacy consulted to dose Vancomycin and Cefepime for sepsis due to stage I sacral decubitus ulcer. ESRD on HD TTS. HD today 10/1 on schedule.  WBC 11.7, afebrile. S/p bedside debridement 11/25/2018 Vanc and cefepime continued.  Plan: Continue Cefepime 2 g IV qHD-TTS. Continue Vancomyin 1000 mg after each HD on TTS F/u HD schedule F/u WBC, temp, clinical course  Weight: 264 lb 12.4 oz (120.1 kg)  Temp (24hrs), Avg:97.7 F (36.5 C), Min:97.5 F (36.4 C), Max:98 F (36.7 C)  Recent Labs  Lab 11/10/2018 1722 10/31/2018 1723 11/25/18 0634 11/26/18 0236 11/28/18 0152  WBC 10.9*  --  11.2* 10.1 11.7*  CREATININE 3.11*  --  3.34* 4.02* 3.59*  LATICACIDVEN  --  2.5* 2.3*  --   --     CrCl cannot be calculated (Unknown ideal weight.).    No Known Allergies  Antimicrobials this admission: 9/27 Vanc>> 9/28 Cefepime>>  Dose adjustments this admission:  Microbiology results: 9/27 COVID : negative  Thank you for allowing pharmacy to be a part of this patient's care.  Nicole Cella, [place] Please check AMION for all [place] phone numbers After 10:00 PM, call [place] [phone] 11/28/2018 3:00 PM

## 2018-11-28 NOTE — Progress Notes (Addendum)
[facility] Progress Note   Subjective: Seen in HD unit, UF goal 1L. C/o pain in buttocks, about the same per patient. Denies CP, SOB.   Objective Vitals:   11/28/18 0723 11/28/18 0730 11/28/18 0800 11/28/18 0830  BP: (!) 88/62 (!) 99/57 (!) 98/52 (!) 88/50  Pulse: 70 72 65 65  Resp:      Temp:      TempSrc:      SpO2:      Weight:        Physical Exam General: Obese male NAD, alert  Heart: RRR  Lungs: CTAB  Abdomen: soft NT +BS Extremities: L AKA trace stump edema  Dialysis Access: RUE AVF aneurysmal, pulsatile bruit    Dialysis Orders:  [facility] on [address]  TTS   3h 23min   113kg (250 lbs)    2/2.5 bath  Heparin none   R AVF No VDRA Mircera 200 last 8/27   Assessment/Plan: 1. Stage IV sacral decubitus ulcer - Limited mobility. Mostly wheelchair/bedbound. S/P bedside debridement per surgery 9/28.  Hydrotherapy,  Vanc, cefepime - per primary.  2. ESRD -  HD TTS. Continue on schedule. HD today.   3. Hypotension/Volume On midodrine for BP support. At EDW by weights here. UF 1-2L as tolerated 4. Anemia  - Hgb 10.1 >9.4. Will give darbe 100 with HD [id number].  5. Metabolic bone disease -  Corr Ca 8.4. No Vit D as outpatient. Continue binders.  6. Nutrition - Prot supp for low albumin  7. DM -insulin per primary  8. S/p cystectomy with urostomy    Lynnda Child PA-C [facility] Pager [phone] 11/28/2018,8:52 AM  Pt seen, examined and agree w A/P as above.  Kelly Splinter  MD 11/28/2018, 1:20 PM    Weight change:    Additional Objective Labs: Basic Metabolic Panel: Recent Labs  Lab 11/25/18 0634 11/26/18 0236 11/28/18 0152  NA 132* 132* 130*  K 4.5 5.1 5.1  CL 95* 95* 93*  CO2 22 22 23   GLUCOSE 133* 141* 147*  BUN 41* 49* 48*  CREATININE 3.34* 4.02* 3.59*  CALCIUM 7.0* 6.7* 6.9*  PHOS  --  3.2  --    CBC: Recent Labs  Lab 11/14/2018 1722 11/25/18 0634 11/26/18 0236 11/28/18 0152  WBC 10.9* 11.2* 10.1 11.7*   NEUTROABS 9.6*  --  8.6*  --   HGB 9.8* 10.3* 9.7* 9.4*  HCT 35.4* 38.1* 33.7* 33.1*  MCV 91.0 92.3 88.9 88.5  PLT 152 133* 127* 137*   Blood Culture    Component Value Date/Time   SDES BLOOD LEFT ARM 12/28/2016 0305   SPECREQUEST IN PEDIATRIC BOTTLE Blood Culture adequate volume 12/28/2016 0305   CULT NO GROWTH 5 DAYS 12/28/2016 0305   REPTSTATUS 01/02/2017 FINAL 12/28/2016 0305     Medications: . [START ON 11/29/2018] sodium chloride    . [START ON 11/29/2018] sodium chloride    . ceFEPime (MAXIPIME) IV    . vancomycin 1,000 mg (11/26/18 1702)   . Chlorhexidine Gluconate Cloth  6 each Topical Q0600  . collagenase   Topical Daily  . feeding supplement (PRO-STAT SUGAR FREE 64)  30 mL Oral BID  . gabapentin  100 mg Oral TID  . heparin  5,000 Units Subcutaneous Q8H  . insulin aspart  0-9 Units Subcutaneous TID WC  . insulin glargine  20 Units Subcutaneous QHS  . levothyroxine  125 mcg Oral Q0600  . midodrine  15 mg Oral TID WC  . sevelamer  carbonate  800 mg Oral TID WC

## 2018-11-28 NOTE — Progress Notes (Signed)
Patient returned to unit from Dialysis.

## 2018-11-28 NOTE — Progress Notes (Signed)
PROGRESS NOTE    [name]  [id number] DOB: 09/02/1955 DOA: 11/16/2018 PCP: [place]   Brief Narrative:  HPI on 10/31/2018 by Dr. Harrold Donath is a 64 y.o. male with medical history significant of end-stage renal disease on hemodialysis TTS, peripheral vascular disease status post left AKA, right femoral fracture, diabetes, morbid obesity, hypertension, hyperlipidemia presenting to the ER with ulcer in his sacral.  Patient is largely wheelchair and bedbound.  He also spends at least 4 hours at dialysis 3 times a week therefore putting a lot of pressure on his sacral area.  He developed a small ulcer apparently about 4 weeks ago which has now progressed to stage IV sacral decubitus ulcer.  He came in with a pain as well as discharge from the area.  Patient was seen in the ER and evaluated and help to have what appears to be infected decubitus ulcer.  He has some eschar over it.  Has not used anything prior to now.  Denied any fever or chills no nausea vomiting or diarrhea.  Patient is being admitted for management of his decubitus ulcer which may require debridement and aggressive wound care.   Interim history  Patient admitted for sacral decub ulcer. Currently on vancomycin and cefepime and undergoing hydrotherapy. General surgery consulted.  Assessment & Plan   Sacral decubitus ulcer -Stage IV and infected, noted on admission -patient mostly wheelchair/bedbound -S/p bedside debridement 11/25/2018 by general surgery -Continue vancomycin and cefepime -Continue hydrotherapy- wound very slowly improving  -No cultures obtained -No leukocytosis, patient currently afebrile. However, CRP 21.9 -X-ray of sacrum/coccyx showed diffuse bony demineralization.  Sacral ulceration extends to the level of the bone however evaluation is markedly limited.  -complains of more sacral pain today and does not feel morphine helps- have added IV dilaudid PRN for severe  pain or hydrotherapy   Abdominal pain -Recently hospitalized and had perihepatic abscess requiring drain placement and antibiotics -CT abdomen/pelvis small residual collection along the posterior border of the right liver lobe measuring 1.8 x 3.8 x 2.5 cm in size.  Is concerning for residual abscess or infection.  Persistent periureteral stranding increased from prior exams. Distal sigmoid protrudes into a bowel containing right inguinal hernia, no obstruction. New consolidation LLL.   ESRD on HD -Nephrology consulted and appreciated -Patient dialyzes Tuesday, Thursday, Saturday  Diabetes mellitus, type II -Continue insulin sliding scale, Lantus, CBG monitoring  Chronic hypertension -Continue midodrine  Hypothyroidism -Continue Synthroid  Hyperlipidemia -Continue statin  Neuropathy -Continue gabapentin  Hypomagnesemia -replaced, continue to monitor   Chronic normocytic anemia -Hemoglobin currently 9.4, appears to be close to baseline  Obesity -BMI 33.91 -Status post AKA, likely BMI underestimated -Need to follow-up with PCP to discuss lifestyle modifications and interventions  DVT Prophylaxis  heparin  Code Status: Full  Family Communication: None at bedside  Disposition Plan: Admitted. Pending improvement of wound with hydrotherapy. Continues to need IV antibiotics.   Consultants General surgery Nephrology   Procedures  Bedside I&D  Antibiotics   Anti-infectives (From admission, onward)   Start     Dose/Rate Route Frequency Ordered Stop   11/26/18 1800  ceFEPIme (MAXIPIME) 2 g in sodium chloride 0.9 % 100 mL IVPB     2 g 200 mL/hr over 30 Minutes Intravenous Every T-Th-Sa (1800) 11/25/18 0916     11/26/18 1659  vancomycin (VANCOCIN) 1-5 GM/200ML-% IVPB    Note to Pharmacy: Cherylann Banas   : cabinet override  11/26/18 1659 11/26/18 2037   11/26/18 1200  vancomycin (VANCOCIN) IVPB 1000 mg/200 mL premix     1,000 mg 200 mL/hr over 60 Minutes Intravenous  Every T-Th-Sa (Hemodialysis) 11/08/2018 1916     11/25/18 0945  ceFEPIme (MAXIPIME) 2 g in sodium chloride 0.9 % 100 mL IVPB     2 g 200 mL/hr over 30 Minutes Intravenous STAT 11/25/18 0915 11/25/18 1039   11/01/2018 1930  vancomycin (VANCOCIN) 2,500 mg in sodium chloride 0.9 % 500 mL IVPB     2,500 mg 250 mL/hr over 120 Minutes Intravenous  Once 11/16/2018 1847 11/23/2018 2342      Subjective:   [name] seen and examined today.  Patient seen in hemodialysis.  Complains of pain in his bottom and states it is very bad this morning.  Denies current chest pain, shortness of breath, abdominal pain, nausea or vomiting, dizziness or headache.    Objective:   Vitals:   11/28/18 0723 11/28/18 0730 11/28/18 0800 11/28/18 0830  BP: (!) 88/62 (!) 99/57 (!) 98/52 (!) 88/50  Pulse: 70 72 65 65  Resp:      Temp:      TempSrc:      SpO2:      Weight:        Intake/Output Summary (Last 24 hours) at 11/28/2018 0832 Last data filed at 11/27/2018 1300 Gross per 24 hour  Intake 240 ml  Output -  Net 240 ml   Filed Weights   11/14/2018 1905 11/26/18 1500 11/26/18 1850  Weight: 113.4 kg 113.4 kg 112.4 kg   Exam  General: Well developed, well nourished, NAD  HEENT: NCAT, mucous membranes moist.   Neck: Supple  Cardiovascular: S1 S2 auscultated, RRR  Respiratory: Clear to auscultation bilaterally  Abdomen: Soft, nontender, nondistended, + bowel sounds  Extremities: warm dry without cyanosis clubbing or edema of RLE. L AKA   Neuro: AAOx3, nonfocal  Psych: Appropriate mood and affect  Data Reviewed: I have personally reviewed following labs and imaging studies  CBC: Recent Labs  Lab 11/04/2018 1722 11/25/18 0634 11/26/18 0236 11/28/18 0152  WBC 10.9* 11.2* 10.1 11.7*  NEUTROABS 9.6*  --  8.6*  --   HGB 9.8* 10.3* 9.7* 9.4*  HCT 35.4* 38.1* 33.7* 33.1*  MCV 91.0 92.3 88.9 88.5  PLT 152 133* 127* 0000000*   Basic Metabolic Panel: Recent Labs  Lab 11/23/2018 1722 11/25/18 0634  11/26/18 0236 11/28/18 0152  NA 134* 132* 132* 130*  K 4.2 4.5 5.1 5.1  CL 96* 95* 95* 93*  CO2 26 22 22 23   GLUCOSE 162* 133* 141* 147*  BUN 32* 41* 49* 48*  CREATININE 3.11* 3.34* 4.02* 3.59*  CALCIUM 6.9* 7.0* 6.7* 6.9*  MG  --   --  1.5* 1.9  PHOS  --   --  3.2  --    GFR: CrCl cannot be calculated (Unknown ideal weight.). Liver Function Tests: Recent Labs  Lab 11/23/2018 1722 11/25/18 0634 11/26/18 0236  AST 12* 12*  --   ALT 10 11  --   ALKPHOS 198* 206*  --   BILITOT 1.1 1.2  --   PROT 6.2* 6.4*  --   ALBUMIN 2.2* 2.3* 1.8*   No results for input(s): LIPASE, AMYLASE in the last 168 hours. No results for input(s): AMMONIA in the last 168 hours. Coagulation Profile: Recent Labs  Lab 11/03/2018 1722  INR 1.2   Cardiac Enzymes: No results for input(s): CKTOTAL, CKMB, CKMBINDEX, TROPONINI in the last  168 hours. BNP (last 3 results) No results for input(s): PROBNP in the last 8760 hours. HbA1C: No results for input(s): HGBA1C in the last 72 hours. CBG: Recent Labs  Lab 11/26/18 2159 11/27/18 0816 11/27/18 1242 11/27/18 1645 11/27/18 2128  GLUCAP 232* 155* 192* 136* 119*   Lipid Profile: No results for input(s): CHOL, HDL, LDLCALC, TRIG, CHOLHDL, LDLDIRECT in the last 72 hours. Thyroid Function Tests: No results for input(s): TSH, T4TOTAL, FREET4, T3FREE, THYROIDAB in the last 72 hours. Anemia Panel: No results for input(s): VITAMINB12, FOLATE, FERRITIN, TIBC, IRON, RETICCTPCT in the last 72 hours. Urine analysis:    Component Value Date/Time   COLORURINE AMBER (A) 12/27/2016 1601   APPEARANCEUR TURBID (A) 12/27/2016 1601   LABSPEC >1.046 (H) 12/27/2016 1601   PHURINE 5.0 12/27/2016 1601   GLUCOSEU NEGATIVE 12/27/2016 1601   HGBUR NEGATIVE 12/27/2016 1601   BILIRUBINUR NEGATIVE 12/27/2016 1601   BILIRUBINUR neg 08/10/2014 1637   KETONESUR NEGATIVE 12/27/2016 1601   PROTEINUR NEGATIVE 12/27/2016 1601   UROBILINOGEN 1.0 08/10/2014 1637   UROBILINOGEN  0.2 08/03/2014 1225   NITRITE NEGATIVE 12/27/2016 1601   LEUKOCYTESUR TRACE (A) 12/27/2016 1601   Sepsis Labs: @LABRCNTIP (procalcitonin:4,lacticidven:4)  ) Recent Results (from the past 240 hour(s))  SARS CORONAVIRUS 2 (TAT 6-24 HRS) Nasopharyngeal Nasopharyngeal Swab     Status: None   Collection Time: 11/10/2018  3:10 PM   Specimen: Nasopharyngeal Swab  Result Value Ref Range Status   SARS Coronavirus 2 NEGATIVE NEGATIVE Final    Comment: (NOTE) SARS-CoV-2 target nucleic acids are NOT DETECTED. The SARS-CoV-2 RNA is generally detectable in upper and lower respiratory specimens during the acute phase of infection. Negative results do not preclude SARS-CoV-2 infection, do not rule out co-infections with other pathogens, and should not be used as the sole basis for treatment or other patient management decisions. Negative results must be combined with clinical observations, patient history, and epidemiological information. The expected result is Negative. Fact Sheet for Patients: SugarRoll.be Fact Sheet for Healthcare Providers: https://www.woods-mathews.com/ This test is not yet approved or cleared by the [place] FDA and  has been authorized for detection and/or diagnosis of SARS-CoV-2 by FDA under an Emergency Use Authorization (EUA). This EUA will remain  in effect (meaning this test can be used) for the duration of the COVID-19 declaration under Section 56 4(b)(1) of the Act, 21 U.S.C. section 360bbb-3(b)(1), unless the authorization is terminated or revoked sooner. Performed at [facility], [place] [address] [place], [place] [address]       Radiology Studies: Dg Sacrum/coccyx  Result Date: 11/26/2018 CLINICAL DATA:  Sacral decubitus ulcer, stage IV, pain about the sacrum. EXAM: SACRUM AND COCCYX - 2+ VIEW COMPARISON:  CT 09/26/2018 FINDINGS: AP radiographs are suboptimal due to patient rotation. Furthermore there is  limited evaluation of the sacrum and coccyx due to patient body habitus and diffuse bony demineralization. No gross abnormalities identified though certainly nondisplaced fractures may be present. 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 Vascular calcium is noted in the pelvis. There is soft tissue a ulceration noted posteriorly to the sacrum which appears to extend to the level of the bone. IMPRESSION: Significantly degraded images of the sacrum and coccyx due to patient body habitus, motion and diffuse bony demineralization. Sacral ulceration extends to the level of the bone however evaluation is markedly limited. Consider contrast-enhanced cross-sectional imaging for further evaluation of the sacral decubitus ulcer. Electronically Signed   By: Lovena Le M.D.   On: 11/26/2018 22:39  Ct Abdomen Pelvis W Contrast  Result Date: 11/27/2018 CLINICAL DATA:  Acute generalized abdominal pain, recent hospitalization with perihepatic abscess requiring drain placement in antibiotic EXAM: CT ABDOMEN AND PELVIS WITH CONTRAST TECHNIQUE: Multidetector CT imaging of the abdomen and pelvis was performed using the standard protocol following bolus administration of intravenous contrast. CONTRAST:  148mL OMNIPAQUE IOHEXOL 300 MG/ML  SOLN COMPARISON:  Numerous priors, most recent comparison 09/26/2018 FINDINGS: Portions of the abdomen are excluded from the level of imaging due to patient's size/body habitus. Lower chest: New consolidation in the left lower lobe with air bronchograms. Persistent atelectatic collapse of the right lower lobe. Moderate right and small left pleural effusions with adjacent passive atelectasis. Redemonstrated cardiomegaly. Extensive coronary artery calcification is again seen. Hepatobiliary: Small portion of the right heart border is beyond the level of collimation. No focal liver lesion is seen. There is been interval removal of the previously demonstrated pigtail pleural drain with a small  residual collection along the posterior border of the right lobe liver measuring up to 1.8 x 3.8 x 2.5 cm in size and demonstrating a small amount of peripheral rim enhancement (axial 35, coronal 68). Patient is post cholecystectomy. Extent biliary ductal dilatation is similar to prior exam and likely related to the a combination of age and reservoir effect. Pancreas: Unremarkable. No pancreatic ductal dilatation or surrounding inflammatory changes. Spleen: Borderline splenomegaly, no splenic lesions. Adrenals/Urinary Tract: Adrenal glands are unremarkable. There is severe bilateral renal atrophy with bilateral renal cysts, similar to prior. Nonobstructive nephrolithiasis in the lower pole right kidney is similar to prior. There is persistent periureteral stranding and urothelial thickening similar to slightly increased from comparison exams. Patient is post cystectomy and ileal conduit diversion. Mild stranding is seen adjacent the ileal conduit. Stomach/Bowel: Distal esophagus, stomach and duodenal sweep are unremarkable. No bowel wall thickening or dilatation. No evidence of obstruction. Appendix is present in the right lower quadrant with a stable amount of fluid adjacent likely tracking from the paracolic gutter. No discrete periappendiceal inflammation. Portion of the colon is partially collimated from view. No visible colonic thickening or dilatation. Scattered colonic diverticula without focal pericolonic inflammation to suggest diverticulitis. The distal sigmoid protrudes into a bowel containing right inguinal hernia. No resulting obstruction. Vascular/Lymphatic: Atherosclerotic plaque within the normal caliber aorta. Reactive nodes are present in the mesentery and retroperitoneum not significantly changed from recent comparison studies. Reproductive: Pelvis is largely obscured by streak artifact from patient's bilateral hip arthroplasties. Few coarse prostatic calcifications are visualized, similar to  prior. Other: Extensive abdominal wall laxity. Circumferential body wall edema, similar to prior. Large fat and bowel containing right inguinal hernia. Additional large fat and fluid containing left inguinal hernia as well. Musculoskeletal: Bilateral total hip arthroplasties. Severe degenerative changes in the spine with multilevel compression deformities similar to prior studies no definite acute osseous abnormality is evident. Diffuse atrophy of the abdominal wall and pelvic musculature. IMPRESSION: 1. Portions of the abdomen are excluded from the level of imaging due to patient's size/body habitus. 2. Interval removal of the previously demonstrated pigtail pleural drain with a small residual collection along the posterior border of the right lobe liver measuring up to 1.8 x 3.8 x 2.5 cm in size, with a small amount of peripheral rim enhancement. Findings are concerning for residual abscess or infection. 3. Persistent periureteral stranding and urothelial thickening, similar to slightly increased from prior exams. Correlate with urinalysis to exclude ascending infection. 4. The distal sigmoid protrudes into a bowel containing right inguinal hernia.  No resulting obstruction or CT evidence of vascular compromise. 5. New consolidation in the left lower lobe with air bronchograms, could reflect pneumonia or sequela of aspiration. 6. Moderate right and small left pleural effusions with adjacent passive atelectasis. 7. Worsening body wall edema, compatible with anasarca. 8. Aortic Atherosclerosis (ICD10-I70.0). Electronically Signed   By: Lovena Le M.D.   On: 11/27/2018 02:05     Scheduled Meds: . Chlorhexidine Gluconate Cloth  6 each Topical Q0600  . collagenase   Topical Daily  . feeding supplement (PRO-STAT SUGAR FREE 64)  30 mL Oral BID  . gabapentin  100 mg Oral TID  . heparin  5,000 Units Subcutaneous Q8H  . insulin aspart  0-9 Units Subcutaneous TID WC  . insulin glargine  20 Units Subcutaneous QHS   . levothyroxine  125 mcg Oral Q0600  . midodrine  15 mg Oral TID WC  . sevelamer carbonate  800 mg Oral TID WC   Continuous Infusions: . [START ON 11/29/2018] sodium chloride    . [START ON 11/29/2018] sodium chloride    . ceFEPime (MAXIPIME) IV    . vancomycin 1,000 mg (11/26/18 1702)     LOS: 4 days   Time Spent in minutes   30 minutes  Eshika Reckart D.O. on 11/28/2018 at 8:32 AM  Between 7am to 7pm - Please see pager noted on [email or web address]  After 7pm go to [email or web address]  And look for the night coverage person covering for me after hours  [facility] Office  [phone]

## 2018-11-28 NOTE — Progress Notes (Signed)
2230 last night, patient calling out in pain.Complaing of pain in sacral area. Patient crying . Offered patient Morphine and assistance turning and repositioning, he refused these interventions. Patient stated that morphine didn't do him any good. He said Dilaudid helps him. Paged [place] [place] call clinician for [facility]. One time dose of Dilaudid ordered and administered. Pt rested comfortably after Dilaudid given and allowed his dressing to be changed later.

## 2018-11-28 NOTE — Progress Notes (Signed)
Physical Therapy Wound Treatment Patient Details  Name: HAJIME ASFAW MRN: [id number] Date of Birth: October 03, 1955  Today's Date: 11/28/2018 Time: 8546-2703 Time Calculation (min): 32 min  Subjective  Subjective: Anxious and constantly stating nothing helps. Patient and Family Stated Goals: None stated.  Date of Onset: (Unknown) Prior Treatments: Bedside debridement 9/28  Pain Score:  Pt complains of pain throughout in multiple areas. Doesn't seem to react specifically to sacral treatment. Premedicated.   Wound Assessment     Wound / Incision (Open or Dehisced) 11/25/18 Other (Comment) Sacrum Mid (Active)  Dressing Type Other (Comment);ABD;Barrier Film (skin prep);Gauze (Comment);Moist to dry 11/28/18 1406  Dressing Changed Changed 11/28/18 1406  Dressing Status Clean;Dry;Intact 11/28/18 1406  Dressing Change Frequency Daily 11/28/18 1406  Site / Wound Assessment Black;Red;Yellow 11/28/18 1406  % Wound base Red or Granulating 10% 11/28/18 1406  % Wound base Yellow/Fibrinous Exudate 40% 11/28/18 1406  % Wound base Black/Eschar 50% 11/28/18 1406  % Wound base Other/Granulation Tissue (Comment) 0% 11/28/18 1406  Peri-wound Assessment Intact;Pink 11/28/18 1406  Wound Length (cm) 15 cm 11/26/18 1000  Wound Width (cm) 10 cm 11/26/18 1000  Wound Depth (cm) 3 cm 11/26/18 1000  Wound Volume (cm^3) 450 cm^3 11/26/18 1000  Wound Surface Area (cm^2) 150 cm^2 11/26/18 1000  Undermining (cm) 2.8 cm from about 6:00-12:00 11/26/18 1000  Margins Unattached edges (unapproximated) 11/28/18 1406  Closure None 11/28/18 1406  Drainage Amount Moderate 11/28/18 1406  Drainage Description Purulent 11/28/18 1406  Treatment Debridement (Selective);Hydrotherapy (Pulse lavage);Packing (Saline gauze) 11/28/18 1406   Santyl applied to wound bed prior to applying dressing.    Hydrotherapy Pulsed lavage therapy - wound location: Sacrum Pulsed Lavage with Suction (psi): 12 psi Pulsed Lavage with Suction -  Normal Saline Used: 1000 mL Pulsed Lavage Tip: Tip with splash shield Selective Debridement Selective Debridement - Location: Sacrum Selective Debridement - Tools Used: Forceps;Scissors Selective Debridement - Tissue Removed: Yellow and black necrotic tissue   Wound Assessment and Plan  Wound Therapy - Assess/Plan/Recommendations Wound Therapy - Clinical Statement: Continue to remove necrotic tissue with pulsatile lavage and selective debridement. Pt appears generally miserable and anxious even prior to any treatment. Recommend  palliative care consult and possible anti-anxiety meds. Air bed was ordered for pt and nursing transferred pt to bed only to insist nursing transfer him back to regular bed.  Wound Therapy - Functional Problem List: Decreased mobility, AKA, global weakness.  Factors Delaying/Impairing Wound Healing: Diabetes Mellitus;Immobility;Multiple medical problems Hydrotherapy Plan: Debridement;Dressing change;Patient/family education;Pulsatile lavage with suction Wound Therapy - Frequency: 6X / week Wound Therapy - Follow Up Recommendations: Skilled nursing facility Wound Plan: See above  Wound Therapy Goals- Improve the function of patient's integumentary system by progressing the wound(s) through the phases of wound healing (inflammation - proliferation - remodeling) by: Decrease Necrotic Tissue to: 20% Decrease Necrotic Tissue - Progress: Progressing toward goal Increase Granulation Tissue to: 80% Increase Granulation Tissue - Progress: Progressing toward goal  Goals will be updated until maximal potential achieved or discharge criteria met.  Discharge criteria: when goals achieved, discharge from hospital, MD decision/surgical intervention, no progress towards goals, refusal/missing three consecutive treatments without notification or medical reason.  GP     Shary Decamp [place] 11/28/2018, 3:04 PM Lilyonna Steidle [place] Pager [phone] Office  [phone]

## 2018-11-28 DEATH — deceased

## 2018-11-29 ENCOUNTER — Encounter (HOSPITAL_COMMUNITY): Payer: Self-pay | Admitting: Nephrology

## 2018-11-29 DIAGNOSIS — J9611 Chronic respiratory failure with hypoxia: Secondary | ICD-10-CM

## 2018-11-29 DIAGNOSIS — Z7189 Other specified counseling: Secondary | ICD-10-CM

## 2018-11-29 LAB — RENAL FUNCTION PANEL
Albumin: 1.8 g/dL — ABNORMAL LOW (ref 3.5–5.0)
Anion gap: 13 (ref 5–15)
BUN: 34 mg/dL — ABNORMAL HIGH (ref 8–23)
CO2: 23 mmol/L (ref 22–32)
Calcium: 6.9 mg/dL — ABNORMAL LOW (ref 8.9–10.3)
Chloride: 95 mmol/L — ABNORMAL LOW (ref 98–111)
Creatinine, Ser: 2.69 mg/dL — ABNORMAL HIGH (ref 0.61–1.24)
GFR calc Af Amer: 28 mL/min — ABNORMAL LOW (ref >60)
GFR calc non Af Amer: 24 mL/min — ABNORMAL LOW (ref >60)
Glucose, Bld: 132 mg/dL — ABNORMAL HIGH (ref 70–99)
Phosphorus: 3 mg/dL (ref 2.5–4.6)
Potassium: 4.6 mmol/L (ref 3.5–5.1)
Sodium: 131 mmol/L — ABNORMAL LOW (ref 135–145)

## 2018-11-29 LAB — CBC
HCT: 33 % — ABNORMAL LOW (ref 39.0–52.0)
Hemoglobin: 9.3 g/dL — ABNORMAL LOW (ref 13.0–17.0)
MCH: 25.5 pg — ABNORMAL LOW (ref 26.0–34.0)
MCHC: 28.2 g/dL — ABNORMAL LOW (ref 30.0–36.0)
MCV: 90.4 fL (ref 80.0–100.0)
Platelets: 111 10*3/uL — ABNORMAL LOW (ref 150–400)
RBC: 3.65 MIL/uL — ABNORMAL LOW (ref 4.22–5.81)
RDW: 17.1 % — ABNORMAL HIGH (ref 11.5–15.5)
WBC: 11.3 10*3/uL — ABNORMAL HIGH (ref 4.0–10.5)
nRBC: 0.6 % — ABNORMAL HIGH (ref 0.0–0.2)

## 2018-11-29 LAB — GLUCOSE, CAPILLARY
Glucose-Capillary: 117 mg/dL — ABNORMAL HIGH (ref 70–99)
Glucose-Capillary: 158 mg/dL — ABNORMAL HIGH (ref 70–99)
Glucose-Capillary: 154 mg/dL — ABNORMAL HIGH (ref 70–99)
Glucose-Capillary: 173 mg/dL — ABNORMAL HIGH (ref 70–99)

## 2018-11-29 LAB — HEPATITIS B SURFACE ANTIBODY, QUANTITATIVE: Hep B S AB Quant (Post): 7.2 m[IU]/mL — ABNORMAL LOW (ref >9.9)

## 2018-11-29 LAB — HEPATITIS B E ANTIGEN: Hep B E Ag: NEGATIVE

## 2018-11-29 LAB — MAGNESIUM: Magnesium: 1.8 mg/dL (ref 1.7–2.4)

## 2018-11-29 MED ORDER — CHLORHEXIDINE GLUCONATE CLOTH 2 % EX PADS
6.0000 | MEDICATED_PAD | Freq: Every day | CUTANEOUS | Status: DC
  Administered 2018-11-29 – 2018-12-02 (×4): 6 via TOPICAL

## 2018-11-29 NOTE — Progress Notes (Signed)
PROGRESS NOTE    [name]  [id number] DOB: 1955/06/10 DOA: 11/01/2018 PCP: [place]   Brief Narrative:  HPI on 11/27/2018 by Dr. Harrold Donath is a 63 y.o. male with medical history significant of end-stage renal disease on hemodialysis TTS, peripheral vascular disease status post left AKA, right femoral fracture, diabetes, morbid obesity, hypertension, hyperlipidemia presenting to the ER with ulcer in his sacral.  Patient is largely wheelchair and bedbound.  He also spends at least 4 hours at dialysis 3 times a week therefore putting a lot of pressure on his sacral area.  He developed a small ulcer apparently about 4 weeks ago which has now progressed to stage IV sacral decubitus ulcer.  He came in with a pain as well as discharge from the area.  Patient was seen in the ER and evaluated and help to have what appears to be infected decubitus ulcer.  He has some eschar over it.  Has not used anything prior to now.  Denied any fever or chills no nausea vomiting or diarrhea.  Patient is being admitted for management of his decubitus ulcer which may require debridement and aggressive wound care.   Interim history  Patient admitted for sacral decub ulcer. Currently on vancomycin and cefepime and undergoing hydrotherapy. General surgery consulted.  Assessment & Plan   Sacral decubitus ulcer -Stage IV and infected, noted on admission -patient mostly wheelchair/bedbound -S/p bedside debridement 11/25/2018 by general surgery -Continue vancomycin and cefepime -Continue hydrotherapy -No cultures obtained -No leukocytosis, patient currently afebrile. However, CRP 21.9 -X-ray of sacrum/coccyx showed diffuse bony demineralization.  Sacral ulceration extends to the level of the bone however evaluation is markedly limited.  -complains of more sacral pain today and does not feel morphine helps- have added IV dilaudid PRN for severe pain or hydrotherapy  -will  consult palliative care   Abdominal pain -Recently hospitalized and had perihepatic abscess requiring drain placement and antibiotics -CT abdomen/pelvis small residual collection along the posterior border of the right liver lobe measuring 1.8 x 3.8 x 2.5 cm in size.  Is concerning for residual abscess or infection.  Persistent periureteral stranding increased from prior exams. Distal sigmoid protrudes into a bowel containing right inguinal hernia, no obstruction. New consolidation LLL.   ESRD on HD -Nephrology consulted and appreciated -Patient dialyzes Tuesday, Thursday, Saturday  Diabetes mellitus, type II -Continue insulin sliding scale, Lantus, CBG monitoring  Chronic hypertension -Continue midodrine  Hypothyroidism -Continue Synthroid  Hyperlipidemia -Continue statin  Neuropathy -Continue gabapentin  Hypomagnesemia -replaced, continue to monitor   Chronic normocytic anemia -Hemoglobin currently 9.3, appears to be close to baseline  Obesity -BMI 33.91 -Status post AKA, likely BMI underestimated -Need to follow-up with PCP to discuss lifestyle modifications and interventions  Goals of care -will consult palliative care for symptom management as well as GOC  DVT Prophylaxis  heparin  Code Status: Full  Family Communication: None at bedside  Disposition Plan: Admitted. Pending improvement of wound with hydrotherapy. Continues to need IV antibiotics.   Consultants General surgery Nephrology   Procedures  Bedside I&D  Antibiotics   Anti-infectives (From admission, onward)   Start     Dose/Rate Route Frequency Ordered Stop   11/28/18 1003  vancomycin (VANCOCIN) 1-5 GM/200ML-% IVPB    Note to Pharmacy: Tawanna Solo   : cabinet override      11/28/18 1003 11/28/18 2214   11/26/18 1800  ceFEPIme (MAXIPIME) 2 g in sodium chloride 0.9 % 100 mL IVPB  2 g 200 mL/hr over 30 Minutes Intravenous Every T-Th-Sa (1800) 11/25/18 0916     11/26/18 1659  vancomycin  (VANCOCIN) 1-5 GM/200ML-% IVPB    Note to Pharmacy: Cherylann Banas   : cabinet override      11/26/18 1659 11/26/18 2037   11/26/18 1200  vancomycin (VANCOCIN) IVPB 1000 mg/200 mL premix     1,000 mg 200 mL/hr over 60 Minutes Intravenous Every T-Th-Sa (Hemodialysis) 11/23/2018 1916     11/25/18 0945  ceFEPIme (MAXIPIME) 2 g in sodium chloride 0.9 % 100 mL IVPB     2 g 200 mL/hr over 30 Minutes Intravenous STAT 11/25/18 0915 11/25/18 1039   11/23/2018 1930  vancomycin (VANCOCIN) 2,500 mg in sodium chloride 0.9 % 500 mL IVPB     2,500 mg 250 mL/hr over 120 Minutes Intravenous  Once 11/20/2018 1847 11/19/2018 2342      Subjective:   [name] seen and examined today.  Complains of pain on his "bottom". Denies chest pain, shortness of breath, abdominal pain, N/V, dizziness, headache.   Objective:   Vitals:   11/28/18 1629 11/28/18 2105 11/28/18 2135 11/29/18 0404  BP: (!) 82/60 (!) 86/55 (!) 95/57 91/61  Pulse: 81 79  70  Resp: 19     Temp: 97.6 F (36.4 C) (!) 97.5 F (36.4 C)  (!) 97.5 F (36.4 C)  TempSrc: Oral Oral  Oral  SpO2: 100% 100%  97%  Weight:    121.5 kg    Intake/Output Summary (Last 24 hours) at 11/29/2018 0902 Last data filed at 11/29/2018 0553 Gross per 24 hour  Intake 937 ml  Output 600 ml  Net 337 ml   Filed Weights   11/28/18 0710 11/28/18 1112 11/29/18 0404  Weight: 120.6 kg 120.1 kg 121.5 kg   Exam  General: Well developed, chronically ill appearing, NAD  HEENT: NCAT, mucous membranes moist.   Cardiovascular: S1 S2 auscultated, RRR  Respiratory: Clear to auscultation bilaterally  Abdomen: Soft, obese, nontender, nondistended, + bowel sounds, ostomy  Extremities: warm dry without cyanosis clubbing or edema of RLE. L AKA  Neuro: AAOx3, nonfocal  Psych: Appropriate mood and affect   Data Reviewed: I have personally reviewed following labs and imaging studies  CBC: Recent Labs  Lab 11/11/2018 1722 11/25/18 0634 11/26/18 0236 11/28/18 0152  11/29/18 0538  WBC 10.9* 11.2* 10.1 11.7* 11.3*  NEUTROABS 9.6*  --  8.6*  --   --   HGB 9.8* 10.3* 9.7* 9.4* 9.3*  HCT 35.4* 38.1* 33.7* 33.1* 33.0*  MCV 91.0 92.3 88.9 88.5 90.4  PLT 152 133* 127* 137* [id number]*   Basic Metabolic Panel: Recent Labs  Lab 11/02/2018 1722 11/25/18 0634 11/26/18 0236 11/28/18 0152 11/29/18 0538  NA 134* 132* 132* 130* 131*  K 4.2 4.5 5.1 5.1 4.6  CL 96* 95* 95* 93* 95*  CO2 26 22 22 23 23   GLUCOSE 162* 133* 141* 147* 132*  BUN 32* 41* 49* 48* 34*  CREATININE 3.11* 3.34* 4.02* 3.59* 2.69*  CALCIUM 6.9* 7.0* 6.7* 6.9* 6.9*  MG  --   --  1.5* 1.9 1.8  PHOS  --   --  3.2  --  3.0   GFR: CrCl cannot be calculated (Unknown ideal weight.). Liver Function Tests: Recent Labs  Lab 11/08/2018 1722 11/25/18 0634 11/26/18 0236 11/29/18 0538  AST 12* 12*  --   --   ALT 10 11  --   --   ALKPHOS 198* 206*  --   --  BILITOT 1.1 1.2  --   --   PROT 6.2* 6.4*  --   --   ALBUMIN 2.2* 2.3* 1.8* 1.8*   No results for input(s): LIPASE, AMYLASE in the last 168 hours. No results for input(s): AMMONIA in the last 168 hours. Coagulation Profile: Recent Labs  Lab 11/17/2018 1722  INR 1.2   Cardiac Enzymes: No results for input(s): CKTOTAL, CKMB, CKMBINDEX, TROPONINI in the last 168 hours. BNP (last 3 results) No results for input(s): PROBNP in the last 8760 hours. HbA1C: No results for input(s): HGBA1C in the last 72 hours. CBG: Recent Labs  Lab 11/27/18 2128 11/28/18 1214 11/28/18 1627 11/28/18 2130 11/29/18 0742  GLUCAP 119* 120* 131* 132* 117*   Lipid Profile: No results for input(s): CHOL, HDL, LDLCALC, TRIG, CHOLHDL, LDLDIRECT in the last 72 hours. Thyroid Function Tests: No results for input(s): TSH, T4TOTAL, FREET4, T3FREE, THYROIDAB in the last 72 hours. Anemia Panel: No results for input(s): VITAMINB12, FOLATE, FERRITIN, TIBC, IRON, RETICCTPCT in the last 72 hours. Urine analysis:    Component Value Date/Time   COLORURINE AMBER (A)  12/27/2016 1601   APPEARANCEUR TURBID (A) 12/27/2016 1601   LABSPEC >1.046 (H) 12/27/2016 1601   PHURINE 5.0 12/27/2016 1601   GLUCOSEU NEGATIVE 12/27/2016 1601   HGBUR NEGATIVE 12/27/2016 1601   BILIRUBINUR NEGATIVE 12/27/2016 1601   BILIRUBINUR neg 08/10/2014 1637   KETONESUR NEGATIVE 12/27/2016 1601   PROTEINUR NEGATIVE 12/27/2016 1601   UROBILINOGEN 1.0 08/10/2014 1637   UROBILINOGEN 0.2 08/03/2014 1225   NITRITE NEGATIVE 12/27/2016 1601   LEUKOCYTESUR TRACE (A) 12/27/2016 1601   Sepsis Labs: @LABRCNTIP (procalcitonin:4,lacticidven:4)  ) Recent Results (from the past 240 hour(s))  SARS CORONAVIRUS 2 (TAT 6-24 HRS) Nasopharyngeal Nasopharyngeal Swab     Status: None   Collection Time: 11/23/2018  3:10 PM   Specimen: Nasopharyngeal Swab  Result Value Ref Range Status   SARS Coronavirus 2 NEGATIVE NEGATIVE Final    Comment: (NOTE) SARS-CoV-2 target nucleic acids are NOT DETECTED. The SARS-CoV-2 RNA is generally detectable in upper and lower respiratory specimens during the acute phase of infection. Negative results do not preclude SARS-CoV-2 infection, do not rule out co-infections with other pathogens, and should not be used as the sole basis for treatment or other patient management decisions. Negative results must be combined with clinical observations, patient history, and epidemiological information. The expected result is Negative. Fact Sheet for Patients: SugarRoll.be Fact Sheet for Healthcare Providers: https://www.woods-mathews.com/ This test is not yet approved or cleared by the [place] FDA and  has been authorized for detection and/or diagnosis of SARS-CoV-2 by FDA under an Emergency Use Authorization (EUA). This EUA will remain  in effect (meaning this test can be used) for the duration of the COVID-19 declaration under Section 56 4(b)(1) of the Act, 21 U.S.C. section 360bbb-3(b)(1), unless the authorization is  terminated or revoked sooner. Performed at [facility], [place] [address] [place], [place] [address]       Radiology Studies: No results found.   Scheduled Meds: . Chlorhexidine Gluconate Cloth  6 each Topical Q0600  . collagenase   Topical Daily  . [START ON 11/30/2018] darbepoetin (ARANESP) injection - DIALYSIS  100 mcg Intravenous Q Sat-HD  . feeding supplement (PRO-STAT SUGAR FREE 64)  30 mL Oral BID  . gabapentin  100 mg Oral TID  . heparin  5,000 Units Subcutaneous Q8H  . insulin aspart  0-9 Units Subcutaneous TID WC  . insulin glargine  20 Units Subcutaneous QHS  .  levothyroxine  125 mcg Oral Q0600  . midodrine  15 mg Oral TID WC  . sevelamer carbonate  800 mg Oral TID WC   Continuous Infusions: . ceFEPime (MAXIPIME) IV 2 g (11/28/18 1832)  . vancomycin Stopped (11/28/18 1208)     LOS: 5 days   Time Spent in minutes   30 minutes  Lennex Pietila D.O. on 11/29/2018 at 9:02 AM  Between 7am to 7pm - Please see pager noted on [email or web address]  After 7pm go to [email or web address]  And look for the night coverage person covering for me after hours  [facility] Office  [phone]

## 2018-11-29 NOTE — Progress Notes (Signed)
[facility] Progress Note   Subjective: Seen in room, lethargic, in pain.   Objective Vitals:   11/28/18 1629 11/28/18 2105 11/28/18 2135 11/29/18 0404  BP: (!) 82/60 (!) 86/55 (!) 95/57 91/61  Pulse: 81 79  70  Resp: 19     Temp: 97.6 F (36.4 C) (!) 97.5 F (36.4 C)  (!) 97.5 F (36.4 C)  TempSrc: Oral Oral  Oral  SpO2: 100% 100%  97%  Weight:    121.5 kg    Physical Exam General: Obese male NAD, alert  Heart: RRR  Lungs: CTAB  Abdomen: soft NT +BS Extremities: L AKA trace stump edema  Dialysis Access: RUE AVF aneurysmal, pulsatile bruit    Dialysis Orders:  [facility] on [address]  TTS   3h 45min   113kg (250 lbs)    2/2.5 bath  Heparin none   R AVF No VDRA Mircera 200 last 8/27   Assessment/Plan: 1. Stage IV sacral decubitus ulcer - Limited mobility. Mostly wheelchair/bedbound. S/P bedside debridement per surgery 9/28.  Hydrotherapy,  Vanc, cefepime - per primary.  Poor prognosis.  2. ESRD -  HD TTS. Continue on schedule. HD Sat.  3. Hypotension/volume: On midodrine for BP support. Doubt wt's accurate. Some extra vol on exam.  4. Anemia  - Hgb 10.1 >9.4. Will give darbe 100 with HD [id number].  5. Metabolic bone disease -  Corr Ca 8.4. No Vit D as outpatient. Continue binders.  6. Nutrition - Prot supp for low albumin  7. DM -insulin per primary  8. S/p cystectomy with urostomy    Kelly Splinter  MD 11/29/2018, 1:27 PM    Weight change:    Additional Objective Labs: Basic Metabolic Panel: Recent Labs  Lab 11/26/18 0236 11/28/18 0152 11/29/18 0538  NA 132* 130* 131*  K 5.1 5.1 4.6  CL 95* 93* 95*  CO2 22 23 23   GLUCOSE 141* 147* 132*  BUN 49* 48* 34*  CREATININE 4.02* 3.59* 2.69*  CALCIUM 6.7* 6.9* 6.9*  PHOS 3.2  --  3.0   CBC: Recent Labs  Lab 11/22/2018 1722 11/25/18 0634 11/26/18 0236 11/28/18 0152 11/29/18 0538  WBC 10.9* 11.2* 10.1 11.7* 11.3*  NEUTROABS 9.6*  --  8.6*  --   --   HGB 9.8* 10.3* 9.7* 9.4* 9.3*  HCT 35.4*  38.1* 33.7* 33.1* 33.0*  MCV 91.0 92.3 88.9 88.5 90.4  PLT 152 133* 127* 137* 111*   Blood Culture    Component Value Date/Time   SDES BLOOD LEFT ARM 12/28/2016 0305   SPECREQUEST IN PEDIATRIC BOTTLE Blood Culture adequate volume 12/28/2016 0305   CULT NO GROWTH 5 DAYS 12/28/2016 0305   REPTSTATUS 01/02/2017 FINAL 12/28/2016 0305     Medications: . ceFEPime (MAXIPIME) IV 2 g (11/28/18 1832)  . vancomycin Stopped (11/28/18 1208)   . Chlorhexidine Gluconate Cloth  6 each Topical Q0600  . Chlorhexidine Gluconate Cloth  6 each Topical Q0600  . collagenase   Topical Daily  . [START ON 11/30/2018] darbepoetin (ARANESP) injection - DIALYSIS  100 mcg Intravenous Q Sat-HD  . feeding supplement (PRO-STAT SUGAR FREE 64)  30 mL Oral BID  . gabapentin  100 mg Oral TID  . heparin  5,000 Units Subcutaneous Q8H  . insulin aspart  0-9 Units Subcutaneous TID WC  . insulin glargine  20 Units Subcutaneous QHS  . levothyroxine  125 mcg Oral Q0600  . midodrine  15 mg Oral TID WC  . sevelamer carbonate  800 mg Oral  TID WC

## 2018-11-29 NOTE — Consult Note (Signed)
Consultation Note Date: 11/29/2018   Patient Name: [name]  DOB: [birth year]  MRN: [id number]  Age / Sex: 63 y.o., male  PCP: [name] Referring Physician: Cristal Ford, DO  Reason for Consultation: Establishing goals of care, Pain control and Psychosocial/spiritual support  HPI/Patient Profile: 63 y.o. male  with past medical history of ESRD on HD, PVD s/p L AKA, DVT, anxiety, urostomy, DM, and a recent admission at [facility] with a liver abscess that required drainage and antibiotics who was admitted on 11/03/2018 with an infected stage 4 sacral decubitus ulceration.  He underwent debridement on 9/28.  He has had difficulty this hospitalization with hypotension, pain, and lethargy.  Imaging shows he still has a fluid collection in his liver that is 3.8 x 2.5 cm.  Clinical Assessment and Goals of Care:  I have reviewed medical records including EPIC notes, labs and imaging, received report from [name], assessed the patient and then met at the bedside along with his wife [name]  to discuss diagnosis prognosis, [place], EOL wishes, disposition and options.  I introduced Palliative Medicine as specialized medical care for people living with serious illness. It focuses on providing relief from the symptoms and stress of a serious illness. The goal is to improve quality of life for both the patient and the family.  We discussed a brief life review of the patient.  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  He was laid off.  He majored in Scientist, physiological at [employer].  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  He loved that job. He has been on HD for the last 4 years.  [name] describes the numerous health issues he has had to bare.  [name] has no children, siblings or parents.  Everyone  else in his family has passed away.  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    Per [name] loves sports (all sports), and he studied music in college and loves music.  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  [name] says that [name] really started to decline two years ago after he fractured his leg.  He hasn't been the same since.  Prior to that he could do some things for himself - even grocery shop.  [name] is hopeful [name] will heal and they can get some sort of "normal" life back.  I expressed a concern that [name] would not be able to sit in a chair for hemodialysis.  [name] immediately said well when he was at [facility] in the past for wound care they did hemodialysis in the bed.  [name] said [place] did well at [facility] and would be willing to return there.  He would not be happy at SNF.  He was at [facility] and stated that he would  rather die than return there.  We talked about code status.  [name] states that [place] would want full code.  He's been thru a lot and he has always come thru it.  He would not want to give up.  I committed to [place] and [place] that I would follow up with them tomorrow.  Primary Decision Maker:  PATIENT.   His wife [name] is his default HCPOA.    SUMMARY OF RECOMMENDATIONS    PMT introductory meeting completed.   PMT will follow up again tomorrow - may attempt to complete a Living Will. Nothing to add at this point with regard to pain control.  This is a difficult situation given his hypotension.  He is currently on 1 mg dilaudid PRN. May need LTAC given severity of his wounds and need for HD. Would recommend Palliative to follow him outpatient where ever he goes.  Code Status/Advance Care Planning:  Full code   Symptom Management:   Per primary team.  Primarily complaining about abdominal pain.   Psycho-social/Spiritual:   Desire for further Chaplaincy support:  Welcomed.  Prognosis:   Unable to determine.  Less than 1 year would not be surprising.  Discharge Planning: To Be Determined      Primary Diagnoses: Present on Admission: . Decubitus ulcer of sacral region, stage 4 ([place]) . Unstageable pressure ulcer of right buttock ([place]) . Chronic venous insufficiency . Essential hypertension . OBESITY . End stage renal disease ([place]) . Closed fracture of distal end of right femur ([place])   I have reviewed the medical record, interviewed the patient and family, and examined the patient. The following aspects are pertinent.  Past Medical History:  Diagnosis Date  . Anemia   . Anxiety   . AR (allergic rhinitis)   . Arthritis    osteoarthritis  . Blood transfusion   . BPH (benign prostatic hypertrophy)   . Cellulitis of left leg 08/14/2012  . Cholelithiasis   . Chronic kidney disease    hx of kidney stones  . Chronic pain   . Diabetes mellitus    insulin dependent  . Displacement of lumbar intervertebral disc without myelopathy    L4-L5  . Diverticulosis of colon   . DVT (deep venous thrombosis) (HCC)    Lower Extremity  . Embolism and thrombosis of unspecified artery ([place])   . ESRD (end stage renal disease) ([place])   . Gallstone   . History of nephrolithiasis    Bilateral  . Hyperlipidemia   . Hypertension   . Hypertrophy of prostate   . Hypothyroidism   . Impotence of organic origin   . Morbid obesity ([place])   . Nephrolithiasis   . Non-pressure chronic ulcer of left calf, limited to breakdown of skin ([place]) 02/07/2016  . Osteomyelitis ([place])    left tibia and left knee  . Other lymphedema   . Pancreatitis, acute   . Peripheral vascular disease ([place])   . Personal history of arthritis    Osteoarthritis  . Pyelonephritis 10/29/2012  . Pyogenic arthritis of left knee joint ([place])   . S/P BKA (below knee amputation) unilateral ([place]) 01/22/2015  . Sleep apnea    uses cpap  . Subacute osteomyelitis of left tibia ([place]) 03/05/2017  .  Traumatic closed nondisplaced fracture of neck of femur, left, sequela 03/05/2017  . Unspecified septicemia(038.9) ([place])    Social History   Socioeconomic History  . Marital status: Married    Spouse name: Not on file  . Number of children:  Not on file  . Years of education: Not on file  . Highest education level: Not on file  Occupational History  . Not on file  Social Needs  . Financial resource strain: Not on file  . Food insecurity    Worry: Not on file    Inability: Not on file  . Transportation needs    Medical: Not on file    Non-medical: Not on file  Tobacco Use  . Smoking status: Never Smoker  . Smokeless tobacco: Never Used  Substance and Sexual Activity  . Alcohol use: No  . Drug use: No  . Sexual activity: Not Currently  Lifestyle  . Physical activity    Days per week: Not on file    Minutes per session: Not on file  . Stress: Not on file  Relationships  . Social Herbalist on phone: Not on file    Gets together: Not on file    Attends religious service: Not on file    Active member of club or organization: Not on file    Attends meetings of clubs or organizations: Not on file    Relationship status: Not on file  Other Topics Concern  . Not on file  Social History Narrative   Lives with his wife and uses a wheelchair for transfers.     Family History  Problem Relation Age of Onset  . Diabetes Mother   . Heart attack Mother   . Stroke Mother   . Colon cancer Father   . Dementia Father   . Heart attack Father   . Arthritis Paternal Grandmother   . Hypertension Other   . Hypothyroidism Other    Scheduled Meds: . Chlorhexidine Gluconate Cloth  6 each Topical Q0600  . Chlorhexidine Gluconate Cloth  6 each Topical Q0600  . collagenase   Topical Daily  . [START ON 11/30/2018] darbepoetin (ARANESP) injection - DIALYSIS  100 mcg Intravenous Q Sat-HD  . feeding supplement (PRO-STAT SUGAR FREE 64)  30 mL Oral BID  . gabapentin  100 mg Oral TID   . heparin  5,000 Units Subcutaneous Q8H  . insulin aspart  0-9 Units Subcutaneous TID WC  . insulin glargine  20 Units Subcutaneous QHS  . levothyroxine  125 mcg Oral Q0600  . midodrine  15 mg Oral TID WC  . sevelamer carbonate  800 mg Oral TID WC   Continuous Infusions: . ceFEPime (MAXIPIME) IV 2 g (11/28/18 1832)  . vancomycin Stopped (11/28/18 1208)   PRN Meds:.acetaminophen **OR** acetaminophen, HYDROmorphone (DILAUDID) injection, loperamide HCl, ondansetron **OR** ondansetron (ZOFRAN) IV, oxyCODONE No Known Allergies Review of Systems complains of stomach pain  Physical Exam  Well developed ill appearing male, awake, alert, wife at bedside CV rrr resp no distress Abdomen obese soft, tender to palpation, tender to even light palpation. Lower extremity - reddened, in boot.  Vital Signs: BP (!) 89/59 (BP Location: Left Wrist)   Pulse 73   Temp (!) 97.5 F (36.4 C) (Oral)   Resp 20   Wt 121.5 kg   SpO2 100%   BMI 36.33 kg/m  Pain Scale: 0-10 POSS *See Group Information*: 1-Acceptable,Awake and alert Pain Score: 4    SpO2: SpO2: 100 % O2 Device:SpO2: 100 % O2 Flow Rate: .O2 Flow Rate (L/min): 4 L/min  IO: Intake/output summary:   Intake/Output Summary (Last 24 hours) at 11/29/2018 2007 Last data filed at 11/29/2018 0553 Gross per 24 hour  Intake 400 ml  Output 100 ml  Net 300 ml    LBM: Last BM Date: 11/29/18 Baseline Weight: Weight: 113.4 kg Most recent weight: Weight: 121.5 kg     Palliative Assessment/Data: 30%     Time In: 5:00 Time Out: 5:50 Time Total: 50 min Visit consisted of counseling and education dealing with the complex and emotionally intense issues surrounding the need for palliative care and symptom management in the setting of serious and potentially life-threatening illness. Greater than 50%  of this time was spent counseling and coordinating care related to the above assessment and plan.  Signed by: Florentina Jenny, PA-C Palliative  Medicine Pager: [phone]  Please contact Palliative Medicine Team phone at [phone] for questions and concerns.  For individual provider: See Shea Evans

## 2018-11-29 NOTE — Progress Notes (Signed)
Physical Therapy Wound Treatment Patient Details  Name: [name] MRN: [id number] Date of Birth: 1955-07-27  Today's Date: 11/29/2018 Time: 1950-9326 Time Calculation (min): 38 min  Subjective  Subjective: Pt remains anxious and repeatedly talks about recent infection in his stomach Patient and Family Stated Goals: None stated.  Date of Onset: (Unknown) Prior Treatments: Bedside debridement 9/28  Pain Score:  Severe pain in sacral area. Premedicated with IV meds  Wound Assessment        Wound / Incision (Open or Dehisced) 11/25/18 Other (Comment) Sacrum Mid (Active)  Dressing Type Other (Comment);ABD;Barrier Film (skin prep);Gauze (Comment);Moist to dry 11/29/18 1036  Dressing Changed Changed 11/29/18 1036  Dressing Status Clean;Dry;Intact 11/29/18 1036  Dressing Change Frequency Daily 11/29/18 1036  Site / Wound Assessment Black;Red;Brown 11/29/18 1036  % Wound base Red or Granulating 10% 11/29/18 1036  % Wound base Yellow/Fibrinous Exudate 0% 11/29/18 1036  % Wound base Black/Eschar 90% 11/29/18 1036  % Wound base Other/Granulation Tissue (Comment) 0% 11/29/18 1036  Peri-wound Assessment Intact;Pink 11/29/18 1036  Wound Length (cm) 15 cm 11/26/18 1000  Wound Width (cm) 10 cm 11/26/18 1000  Wound Depth (cm) 3 cm 11/26/18 1000  Wound Volume (cm^3) 450 cm^3 11/26/18 1000  Wound Surface Area (cm^2) 150 cm^2 11/26/18 1000  Undermining (cm) 2.8 cm from about 6:00-12:00 11/26/18 1000  Margins Unattached edges (unapproximated) 11/29/18 1036  Closure None 11/29/18 1036  Drainage Amount Moderate 11/29/18 1036  Drainage Description Purulent 11/29/18 1036  Treatment Debridement (Selective);Hydrotherapy (Pulse lavage);Packing (Saline gauze) 11/29/18 1036   Santyl applied to wound bed prior to applying dressing.    Hydrotherapy Pulsed lavage therapy - wound location: Sacrum Pulsed Lavage with Suction (psi): 12 psi Pulsed Lavage with Suction - Normal Saline Used: 1000 mL Pulsed  Lavage Tip: Tip with splash shield Selective Debridement Selective Debridement - Location: Sacrum Selective Debridement - Tools Used: Forceps;Scalpel Selective Debridement - Tissue Removed: black necrotic tissue   Wound Assessment and Plan  Wound Therapy - Assess/Plan/Recommendations Wound Therapy - Clinical Statement: Continue to remove necrotic tissue with pulsatile lavage and selective debridement. Continues to have pain and cannot tolerate head of bed up >20 degrees after treatment due to sacral pain. Pt is HD patient and doubt he will tolerate sitting in recliner for OP HD any time soon. Pt to get palliative consult.  Wound Therapy - Functional Problem List: Decreased mobility, AKA, global weakness.  Factors Delaying/Impairing Wound Healing: Diabetes Mellitus;Immobility;Multiple medical problems Hydrotherapy Plan: Debridement;Dressing change;Patient/family education;Pulsatile lavage with suction Wound Therapy - Frequency: 6X / week Wound Therapy - Follow Up Recommendations: Skilled nursing facility Wound Plan: See above  Wound Therapy Goals- Improve the function of patient's integumentary system by progressing the wound(s) through the phases of wound healing (inflammation - proliferation - remodeling) by: Decrease Necrotic Tissue to: 20% Decrease Necrotic Tissue - Progress: Progressing toward goal Increase Granulation Tissue to: 80% Increase Granulation Tissue - Progress: Progressing toward goal  Goals will be updated until maximal potential achieved or discharge criteria met.  Discharge criteria: when goals achieved, discharge from hospital, MD decision/surgical intervention, no progress towards goals, refusal/missing three consecutive treatments without notification or medical reason.  GP     Shary Decamp [place]cok 11/29/2018, 10:41 AM [place] Pager [phone] Office [phone]

## 2018-11-29 NOTE — Care Management Important Message (Signed)
Important Message  Patient Details  Name: [name] MRN: [id number] Date of Birth: November 07, 1955   Medicare Important Message Given:  Yes     Memory Argue 11/29/2018, 3:01 PM    SIGNED BY PATIENT WIFE

## 2018-11-29 NOTE — Progress Notes (Signed)
Patient ID: [name], male   DOB: February 06, 1956, 63 y.o.   MRN: [id number]       Subjective: C/o pain at his wound site  Objective: Vital signs in last 24 hours: Temp:  [97.5 F (36.4 C)-97.6 F (36.4 C)] 97.5 F (36.4 C) (10/02 0404) Pulse Rate:  [69-81] 70 (10/02 0404) Resp:  [18-19] 19 (10/01 1629) BP: (82-107)/(46-61) 91/61 (10/02 0404) SpO2:  [97 %-100 %] 97 % (10/02 0404) Weight:  [120.1 kg-121.5 kg] 121.5 kg (10/02 0404) Last BM Date: 11/28/18  Intake/Output from previous day: 10/01 0701 - 10/02 0700 In: 937 [P.O.:537; IV Piggyback:400] Out: 600 [Urine:100] Intake/Output this shift: No intake/output data recorded.  PE: Skin:  Some granulation tissue visible through necrotic tissue.  The middle base of this wound is his sacrum and this is just necrotic myofascial covering that will likely not be debrided off.     Lab Results:  Recent Labs    11/28/18 0152  WBC 11.7*  HGB 9.4*  HCT 33.1*  PLT 137*   BMET Recent Labs    11/28/18 0152 11/29/18 0538  NA 130* 131*  K 5.1 4.6  CL 93* 95*  CO2 23 23  GLUCOSE 147* 132*  BUN 48* 34*  CREATININE 3.59* 2.69*  CALCIUM 6.9* 6.9*   PT/INR No results for input(s): LABPROT, INR in the last 72 hours. CMP     Component Value Date/Time   NA 131 (L) 11/29/2018 0538   NA 136 (A) 04/29/2015   NA 140 03/10/2008 1321   K 4.6 11/29/2018 0538   K 4.1 03/10/2008 1321   CL 95 (L) 11/29/2018 0538   CL 99 03/10/2008 1321   CO2 23 11/29/2018 0538   CO2 30 03/10/2008 1321   GLUCOSE 132 (H) 11/29/2018 0538   GLUCOSE 163 (H) 03/10/2008 1321   BUN 34 (H) 11/29/2018 0538   BUN 15 04/29/2015   BUN 14 03/10/2008 1321   CREATININE 2.69 (H) 11/29/2018 0538   CREATININE 5.73 (H) 07/17/2013 1557   CALCIUM 6.9 (L) 11/29/2018 0538   CALCIUM 9.4 03/10/2008 1321   PROT 6.4 (L) 11/25/2018 0634   PROT 6.9 03/10/2008 1321   ALBUMIN 1.8 (L) 11/29/2018 0538   ALBUMIN 3.4 03/10/2008 1321   AST 12 (L) 11/25/2018 0634   AST 21  03/10/2008 1321   ALT 11 11/25/2018 0634   ALT 20 03/10/2008 1321   ALKPHOS 206 (H) 11/25/2018 0634   ALKPHOS 69 03/10/2008 1321   BILITOT 1.2 11/25/2018 0634   BILITOT 0.80 03/10/2008 1321   GFRNONAA 24 (L) 11/29/2018 0538   GFRNONAA 23 (L) 03/07/2013 1719   GFRAA 28 (L) 11/29/2018 0538   GFRAA 26 (L) 03/07/2013 1719   Lipase     Component Value Date/Time   LIPASE 11 10/10/2012 1306       Studies/Results: No results found.  Anti-infectives: Anti-infectives (From admission, onward)   Start     Dose/Rate Route Frequency Ordered Stop   11/28/18 1003  vancomycin (VANCOCIN) 1-5 GM/200ML-% IVPB    Note to Pharmacy: Tawanna Solo   : cabinet override      11/28/18 1003 11/28/18 2214   11/26/18 1800  ceFEPIme (MAXIPIME) 2 g in sodium chloride 0.9 % 100 mL IVPB     2 g 200 mL/hr over 30 Minutes Intravenous Every T-Th-Sa (1800) 11/25/18 0916     11/26/18 1659  vancomycin (VANCOCIN) 1-5 GM/200ML-% IVPB    Note to Pharmacy: Cherylann Banas   : cabinet override  11/26/18 1659 11/26/18 2037   11/26/18 1200  vancomycin (VANCOCIN) IVPB 1000 mg/200 mL premix     1,000 mg 200 mL/hr over 60 Minutes Intravenous Every T-Th-Sa (Hemodialysis) 11/16/2018 1916     11/25/18 0945  ceFEPIme (MAXIPIME) 2 g in sodium chloride 0.9 % 100 mL IVPB     2 g 200 mL/hr over 30 Minutes Intravenous STAT 11/25/18 0915 11/25/18 1039   11/17/2018 1930  vancomycin (VANCOCIN) 2,500 mg in sodium chloride 0.9 % 500 mL IVPB     2,500 mg 250 mL/hr over 120 Minutes Intravenous  Once 11/27/2018 1847 11/04/2018 2342       Assessment/Plan DM ESRD - TTS Hypotension Hypothyroidism HLD S/p Left AKA  Sacral wound - cleaning up some.  Down to his bone. -cont hydrotherapy.  No further surgical intervention at this time -will see a couple times next week to follow progress  ID - maxipime/vancomycin 9/27>> VTE - SCDs, sq heparin FEN - CM/HH diet Foley - urostomy Follow up - outpatient wound care center   LOS: 5  days    Henreitta Cea , [facility] 11/29/2018, 8:50 AM Pager: [phone]

## 2018-11-30 LAB — GLUCOSE, CAPILLARY
Glucose-Capillary: 100 mg/dL — ABNORMAL HIGH (ref 70–99)
Glucose-Capillary: 126 mg/dL — ABNORMAL HIGH (ref 70–99)
Glucose-Capillary: 116 mg/dL — ABNORMAL HIGH (ref 70–99)
Glucose-Capillary: 130 mg/dL — ABNORMAL HIGH (ref 70–99)

## 2018-11-30 LAB — RENAL FUNCTION PANEL
Albumin: 2.1 g/dL — ABNORMAL LOW (ref 3.5–5.0)
Anion gap: 11 (ref 5–15)
BUN: 51 mg/dL — ABNORMAL HIGH (ref 8–23)
CO2: 25 mmol/L (ref 22–32)
Calcium: 7.1 mg/dL — ABNORMAL LOW (ref 8.9–10.3)
Chloride: 94 mmol/L — ABNORMAL LOW (ref 98–111)
Creatinine, Ser: 3.27 mg/dL — ABNORMAL HIGH (ref 0.61–1.24)
GFR calc Af Amer: 22 mL/min — ABNORMAL LOW (ref >60)
GFR calc non Af Amer: 19 mL/min — ABNORMAL LOW (ref >60)
Glucose, Bld: 113 mg/dL — ABNORMAL HIGH (ref 70–99)
Phosphorus: 3.4 mg/dL (ref 2.5–4.6)
Potassium: 5.5 mmol/L — ABNORMAL HIGH (ref 3.5–5.1)
Sodium: 130 mmol/L — ABNORMAL LOW (ref 135–145)

## 2018-11-30 LAB — CBC
HCT: 34.7 % — ABNORMAL LOW (ref 39.0–52.0)
Hemoglobin: 9.8 g/dL — ABNORMAL LOW (ref 13.0–17.0)
MCH: 25.3 pg — ABNORMAL LOW (ref 26.0–34.0)
MCHC: 28.2 g/dL — ABNORMAL LOW (ref 30.0–36.0)
MCV: 89.7 fL (ref 80.0–100.0)
Platelets: 116 10*3/uL — ABNORMAL LOW (ref 150–400)
RBC: 3.87 MIL/uL — ABNORMAL LOW (ref 4.22–5.81)
RDW: 17.2 % — ABNORMAL HIGH (ref 11.5–15.5)
WBC: 13.1 10*3/uL — ABNORMAL HIGH (ref 4.0–10.5)
nRBC: 0.6 % — ABNORMAL HIGH (ref 0.0–0.2)

## 2018-11-30 MED ORDER — DARBEPOETIN ALFA 100 MCG/0.5ML IJ SOSY: 100.0000 ug | PREFILLED_SYRINGE | Freq: Once | INTRAMUSCULAR | Status: DC

## 2018-11-30 MED ORDER — VANCOMYCIN HCL IN DEXTROSE 1-5 GM/200ML-% IV SOLN
1000.0000 mg | Freq: Once | INTRAVENOUS | Status: DC
  Filled 2018-11-30: qty 200

## 2018-11-30 NOTE — Progress Notes (Signed)
Advised floor RN Andre Lefort the plan to do patient hemodialysis treatment on 10/4.

## 2018-11-30 NOTE — Progress Notes (Signed)
PROGRESS NOTE    [name]  [id number] DOB: 1955-03-02 DOA: 11/11/2018 PCP: [place]   Brief Narrative:  HPI on 11/23/2018 by Dr. Harrold Donath is a 63 y.o. male with medical history significant of end-stage renal disease on hemodialysis TTS, peripheral vascular disease status post left AKA, right femoral fracture, diabetes, morbid obesity, hypertension, hyperlipidemia presenting to the ER with ulcer in his sacral.  Patient is largely wheelchair and bedbound.  He also spends at least 4 hours at dialysis 3 times a week therefore putting a lot of pressure on his sacral area.  He developed a small ulcer apparently about 4 weeks ago which has now progressed to stage IV sacral decubitus ulcer.  He came in with a pain as well as discharge from the area.  Patient was seen in the ER and evaluated and help to have what appears to be infected decubitus ulcer.  He has some eschar over it.  Has not used anything prior to now.  Denied any fever or chills no nausea vomiting or diarrhea.  Patient is being admitted for management of his decubitus ulcer which may require debridement and aggressive wound care.   Interim history  Patient admitted for sacral decub ulcer. Currently on vancomycin and cefepime and undergoing hydrotherapy. General surgery consulted. Palliative care consulted for symptom management and GOC.  Assessment & Plan   Sacral decubitus ulcer -Stage IV and infected, noted on admission -patient mostly wheelchair/bedbound -S/p bedside debridement 11/25/2018 by general surgery -Continue vancomycin and cefepime -Continue hydrotherapy -No cultures obtained -No leukocytosis, patient currently afebrile. However, CRP 21.9 -X-ray of sacrum/coccyx showed diffuse bony demineralization.  Sacral ulceration extends to the level of the bone however evaluation is markedly limited.  -complains of more sacral pain today and does not feel morphine helps- have added IV  dilaudid PRN for severe pain or hydrotherapy  -Palliative care consulted and appreciated   Abdominal pain -Recently hospitalized and had perihepatic abscess requiring drain placement and antibiotics -CT abdomen/pelvis small residual collection along the posterior border of the right liver lobe measuring 1.8 x 3.8 x 2.5 cm in size.  Is concerning for residual abscess or infection.  Persistent periureteral stranding increased from prior exams. Distal sigmoid protrudes into a bowel containing right inguinal hernia, no obstruction. New consolidation LLL.   ESRD on HD -Nephrology consulted and appreciated -Patient dialyzes Tuesday, Thursday, Saturday  Diabetes mellitus, type II -Continue insulin sliding scale, Lantus, CBG monitoring  Chronic hypertension -Continue midodrine  Hypothyroidism -Continue Synthroid  Hyperlipidemia -Continue statin  Neuropathy -Continue gabapentin  Hypomagnesemia -replaced, continue to monitor   Chronic normocytic anemia -Hemoglobin currently 9.8, appears to be close to baseline  Obesity -BMI 33.91 -Status post AKA, likely BMI underestimated -Need to follow-up with PCP to discuss lifestyle modifications and interventions  Goals of care -Palliative care consulted and appreciated, for symptom management as well as GOC  DVT Prophylaxis  heparin  Code Status: Full  Family Communication: None at bedside  Disposition Plan: Admitted. Pending improvement of wound with hydrotherapy. Continues to need IV antibiotics.   Consultants General surgery Nephrology  Palliative Care  Procedures  Bedside I&D  Antibiotics   Anti-infectives (From admission, onward)   Start     Dose/Rate Route Frequency Ordered Stop   11/28/18 1003  vancomycin (VANCOCIN) 1-5 GM/200ML-% IVPB    Note to Pharmacy: Tawanna Solo   : cabinet override      11/28/18 1003 11/28/18 2214   11/26/18 1800  ceFEPIme (MAXIPIME) 2 g in sodium chloride 0.9 % 100 mL IVPB     2 g 200 mL/hr  over 30 Minutes Intravenous Every T-Th-Sa (1800) 11/25/18 0916     11/26/18 1659  vancomycin (VANCOCIN) 1-5 GM/200ML-% IVPB    Note to Pharmacy: Cherylann Banas   : cabinet override      11/26/18 1659 11/26/18 2037   11/26/18 1200  vancomycin (VANCOCIN) IVPB 1000 mg/200 mL premix     1,000 mg 200 mL/hr over 60 Minutes Intravenous Every T-Th-Sa (Hemodialysis) 11/03/2018 1916     11/25/18 0945  ceFEPIme (MAXIPIME) 2 g in sodium chloride 0.9 % 100 mL IVPB     2 g 200 mL/hr over 30 Minutes Intravenous STAT 11/25/18 0915 11/25/18 1039   11/12/2018 1930  vancomycin (VANCOCIN) 2,500 mg in sodium chloride 0.9 % 500 mL IVPB     2,500 mg 250 mL/hr over 120 Minutes Intravenous  Once 11/04/2018 1847 11/27/2018 2342      Subjective:   [name] seen and examined today.  Feels "ok" this morning. Only wanting ice. Denies current pain. Denies chest pain, shortness of breath, abdominal pain, N/V/D/C, dizziness, headache.   Objective:   Vitals:   11/29/18 0404 11/29/18 1625 11/29/18 2028 11/30/18 0420  BP: 91/61 (!) 89/59 90/62 (!) 89/72  Pulse: 70 73 77 78  Resp:  20 18 16   Temp: (!) 97.5 F (36.4 C) (!) 97.5 F (36.4 C) 98.3 F (36.8 C) 98 F (36.7 C)  TempSrc: Oral Oral Oral Oral  SpO2: 97% 100% 100% 95%  Weight: 121.5 kg       Intake/Output Summary (Last 24 hours) at 11/30/2018 0917 Last data filed at 11/30/2018 0400 Gross per 24 hour  Intake 195 ml  Output -  Net 195 ml   Filed Weights   11/28/18 0710 11/28/18 1112 11/29/18 0404  Weight: 120.6 kg 120.1 kg 121.5 kg   Exam  General: Well developed, chronically ill appearing, NAD  HEENT: NCAT, PERRLA, EOMI, Anicteic Sclera, mucous membranes moist.   Neck: Supple, no JVD, no masses  Cardiovascular: S1 S2 auscultated, RRR  Respiratory: Clear to auscultation bilaterally   Abdomen: Soft, nontender, nondistended, + bowel sounds, ostomy  Extremities: warm dry without cyanosis clubbing or edema of RLE. L AKA  Neuro: AAOx3, nonfocal   Psych: Appropriate mood and affect  Data Reviewed: I have personally reviewed following labs and imaging studies  CBC: Recent Labs  Lab 11/12/2018 1722 11/25/18 0634 11/26/18 0236 11/28/18 0152 11/29/18 0538 11/30/18 0705  WBC 10.9* 11.2* 10.1 11.7* 11.3* 13.1*  NEUTROABS 9.6*  --  8.6*  --   --   --   HGB 9.8* 10.3* 9.7* 9.4* 9.3* 9.8*  HCT 35.4* 38.1* 33.7* 33.1* 33.0* 34.7*  MCV 91.0 92.3 88.9 88.5 90.4 89.7  PLT 152 133* 127* 137* 111* [id number]*   Basic Metabolic Panel: Recent Labs  Lab 11/25/18 0634 11/26/18 0236 11/28/18 0152 11/29/18 0538 11/30/18 0705  NA 132* 132* 130* 131* 130*  K 4.5 5.1 5.1 4.6 5.5*  CL 95* 95* 93* 95* 94*  CO2 22 22 23 23 25   GLUCOSE 133* 141* 147* 132* 113*  BUN 41* 49* 48* 34* 51*  CREATININE 3.34* 4.02* 3.59* 2.69* 3.27*  CALCIUM 7.0* 6.7* 6.9* 6.9* 7.1*  MG  --  1.5* 1.9 1.8  --   PHOS  --  3.2  --  3.0 3.4   GFR: CrCl cannot be calculated (Unknown ideal weight.). Liver Function Tests: Recent Labs  Lab 11/11/2018 1722 11/25/18 0634 11/26/18 0236 11/29/18 0538 11/30/18 0705  AST 12* 12*  --   --   --   ALT 10 11  --   --   --   ALKPHOS 198* 206*  --   --   --   BILITOT 1.1 1.2  --   --   --   PROT 6.2* 6.4*  --   --   --   ALBUMIN 2.2* 2.3* 1.8* 1.8* 2.1*   No results for input(s): LIPASE, AMYLASE in the last 168 hours. No results for input(s): AMMONIA in the last 168 hours. Coagulation Profile: Recent Labs  Lab 11/05/2018 1722  INR 1.2   Cardiac Enzymes: No results for input(s): CKTOTAL, CKMB, CKMBINDEX, TROPONINI in the last 168 hours. BNP (last 3 results) No results for input(s): PROBNP in the last 8760 hours. HbA1C: No results for input(s): HGBA1C in the last 72 hours. CBG: Recent Labs  Lab 11/29/18 0742 11/29/18 1221 11/29/18 1837 11/29/18 2023 11/30/18 0815  GLUCAP 117* 158* 154* 173* 100*   Lipid Profile: No results for input(s): CHOL, HDL, LDLCALC, TRIG, CHOLHDL, LDLDIRECT in the last 72 hours. Thyroid  Function Tests: No results for input(s): TSH, T4TOTAL, FREET4, T3FREE, THYROIDAB in the last 72 hours. Anemia Panel: No results for input(s): VITAMINB12, FOLATE, FERRITIN, TIBC, IRON, RETICCTPCT in the last 72 hours. Urine analysis:    Component Value Date/Time   COLORURINE AMBER (A) 12/27/2016 1601   APPEARANCEUR TURBID (A) 12/27/2016 1601   LABSPEC >1.046 (H) 12/27/2016 1601   PHURINE 5.0 12/27/2016 1601   GLUCOSEU NEGATIVE 12/27/2016 1601   HGBUR NEGATIVE 12/27/2016 1601   BILIRUBINUR NEGATIVE 12/27/2016 1601   BILIRUBINUR neg 08/10/2014 1637   KETONESUR NEGATIVE 12/27/2016 1601   PROTEINUR NEGATIVE 12/27/2016 1601   UROBILINOGEN 1.0 08/10/2014 1637   UROBILINOGEN 0.2 08/03/2014 1225   NITRITE NEGATIVE 12/27/2016 1601   LEUKOCYTESUR TRACE (A) 12/27/2016 1601   Sepsis Labs: @LABRCNTIP (procalcitonin:4,lacticidven:4)  ) Recent Results (from the past 240 hour(s))  SARS CORONAVIRUS 2 (TAT 6-24 HRS) Nasopharyngeal Nasopharyngeal Swab     Status: None   Collection Time: 11/17/2018  3:10 PM   Specimen: Nasopharyngeal Swab  Result Value Ref Range Status   SARS Coronavirus 2 NEGATIVE NEGATIVE Final    Comment: (NOTE) SARS-CoV-2 target nucleic acids are NOT DETECTED. The SARS-CoV-2 RNA is generally detectable in upper and lower respiratory specimens during the acute phase of infection. Negative results do not preclude SARS-CoV-2 infection, do not rule out co-infections with other pathogens, and should not be used as the sole basis for treatment or other patient management decisions. Negative results must be combined with clinical observations, patient history, and epidemiological information. The expected result is Negative. Fact Sheet for Patients: SugarRoll.be Fact Sheet for Healthcare Providers: https://www.woods-mathews.com/ This test is not yet approved or cleared by the [place] FDA and  has been authorized for detection and/or  diagnosis of SARS-CoV-2 by FDA under an Emergency Use Authorization (EUA). This EUA will remain  in effect (meaning this test can be used) for the duration of the COVID-19 declaration under Section 56 4(b)(1) of the Act, 21 U.S.C. section 360bbb-3(b)(1), unless the authorization is terminated or revoked sooner. Performed at [facility], [place] [address], [place], [place] [address]       Radiology Studies: No results found.   Scheduled Meds: . Chlorhexidine Gluconate Cloth  6 each Topical Q0600  . Chlorhexidine Gluconate Cloth  6 each Topical Q0600  . collagenase  Topical Daily  . darbepoetin (ARANESP) injection - DIALYSIS  100 mcg Intravenous Q Sat-HD  . feeding supplement (PRO-STAT SUGAR FREE 64)  30 mL Oral BID  . gabapentin  100 mg Oral TID  . heparin  5,000 Units Subcutaneous Q8H  . insulin aspart  0-9 Units Subcutaneous TID WC  . insulin glargine  20 Units Subcutaneous QHS  . levothyroxine  125 mcg Oral Q0600  . midodrine  15 mg Oral TID WC  . sevelamer carbonate  800 mg Oral TID WC   Continuous Infusions: . ceFEPime (MAXIPIME) IV 2 g (11/28/18 1832)  . vancomycin Stopped (11/28/18 1208)     LOS: 6 days   Time Spent in minutes   30 minutes  Kwynn Schlotter D.O. on 11/30/2018 at 9:17 AM  Between 7am to 7pm - Please see pager noted on [email or web address]  After 7pm go to [email or web address]  And look for the night coverage person covering for me after hours  [facility] Office  [phone]

## 2018-11-30 NOTE — Progress Notes (Signed)
[facility] Progress Note   Subjective: Seen in room.  No new c/o. In pain.   Objective Vitals:   11/29/18 1625 11/29/18 2028 11/30/18 0420 11/30/18 1100  BP: (!) 89/59 90/62 (!) 89/72   Pulse: 73 77 78   Resp: 20 18 16    Temp: (!) 97.5 F (36.4 C) 98.3 F (36.8 C) 98 F (36.7 C)   TempSrc: Oral Oral Oral   SpO2: 100% 100% 95%   Weight:      Height:    5\' 5"  (1.651 m)    Physical Exam General: Obese male NAD, alert  Heart: RRR  Lungs: CTAB  Abdomen: soft NT +BS Extremities: L AKA trace stump edema  Dialysis Access: RUE AVF aneurysmal, pulsatile bruit    Dialysis Orders:  [facility] on [address]  TTS   3h 33min   113kg (250 lbs)    2/2.5 bath  Heparin none   R AVF No VDRA Mircera 200 last 8/27   Assessment/Plan: 1. Stage IV sacral decubitus ulcer - Limited mobility. Mostly wheelchair/bedbound. S/P bedside debridement per surgery 9/28.  Hydrotherapy,  Vanc, cefepime - per primary. Per PC notes has been at [facility] in the past and did well there.  2. ESRD -  HD TTS. Continue on schedule. HD today.  3. Hypotension/volume: On midodrine for BP support. No gross vol excess on exam.   4. Anemia  - Hgb 10.1 >9.4. Will give darbe 100 with HD [id number].  5. Metabolic bone disease -  Corr Ca 8.4. No Vit D as outpatient. Continue binders.  6. Nutrition - Prot supp for low albumin  7. DM -insulin per primary  8. S/p cystectomy with urostomy    [name] Splinter  MD 11/30/2018, 1:34 PM    Weight change:    Additional Objective Labs: Basic Metabolic Panel: Recent Labs  Lab 11/26/18 0236 11/28/18 0152 11/29/18 0538 11/30/18 0705  NA 132* 130* 131* 130*  K 5.1 5.1 4.6 5.5*  CL 95* 93* 95* 94*  CO2 22 23 23 25   GLUCOSE 141* 147* 132* 113*  BUN 49* 48* 34* 51*  CREATININE 4.02* 3.59* 2.69* 3.27*  CALCIUM 6.7* 6.9* 6.9* 7.1*  PHOS 3.2  --  3.0 3.4   CBC: Recent Labs  Lab 11/14/2018 1722 11/25/18 0634 11/26/18 0236 11/28/18 0152 11/29/18 0538  11/30/18 0705  WBC 10.9* 11.2* 10.1 11.7* 11.3* 13.1*  NEUTROABS 9.6*  --  8.6*  --   --   --   HGB 9.8* 10.3* 9.7* 9.4* 9.3* 9.8*  HCT 35.4* 38.1* 33.7* 33.1* 33.0* 34.7*  MCV 91.0 92.3 88.9 88.5 90.4 89.7  PLT 152 133* 127* 137* 111* 116*   Blood Culture    Component Value Date/Time   SDES BLOOD LEFT ARM 12/28/2016 0305   SPECREQUEST IN PEDIATRIC BOTTLE Blood Culture adequate volume 12/28/2016 0305   CULT NO GROWTH 5 DAYS 12/28/2016 0305   REPTSTATUS 01/02/2017 FINAL 12/28/2016 0305     Medications: . ceFEPime (MAXIPIME) IV 2 g (11/28/18 1832)  . vancomycin Stopped (11/28/18 1208)   . Chlorhexidine Gluconate Cloth  6 each Topical Q0600  . Chlorhexidine Gluconate Cloth  6 each Topical Q0600  . collagenase   Topical Daily  . darbepoetin (ARANESP) injection - DIALYSIS  100 mcg Intravenous Q Sat-HD  . feeding supplement (PRO-STAT SUGAR FREE 64)  30 mL Oral BID  . gabapentin  100 mg Oral TID  . heparin  5,000 Units Subcutaneous Q8H  . insulin aspart  0-9 Units  Subcutaneous TID WC  . insulin glargine  20 Units Subcutaneous QHS  . levothyroxine  125 mcg Oral Q0600  . midodrine  15 mg Oral TID WC  . sevelamer carbonate  800 mg Oral TID WC

## 2018-11-30 NOTE — Progress Notes (Signed)
Physical Therapy Wound Treatment Patient Details  Name: [name] MRN: [id number] Date of Birth: [birth year]  Today's Date: 11/30/2018 Time: 1133-1215 Time Calculation (min): 42 min  Subjective  Subjective: Pt remains anxious and repeatedly talks about recent infection in his stomach ( rambles) Patient and Family Stated Goals: None stated.  Prior Treatments: Bedside debridement 9/28  Pain Score: Pain Score: 8   Wound Assessment     Wound / Incision (Open or Dehisced) 11/25/18 Other (Comment) Sacrum Mid (Active)  Dressing Type ABD;Tape dressing;Moist to dry;Barrier Film (skin prep) 11/30/18 1558  Dressing Changed Changed 11/30/18 1558  Dressing Status Clean;Dry;Intact 11/30/18 1558  Dressing Change Frequency Twice a day 11/30/18 1558  Site / Wound Assessment Dressing in place / Unable to assess 11/30/18 1558  % Wound base Red or Granulating 10% 11/30/18 1558  % Wound base Yellow/Fibrinous Exudate 0% 11/30/18 1558  % Wound base Black/Eschar 90% 11/30/18 1558  % Wound base Other/Granulation Tissue (Comment) 0% 11/30/18 1558  Peri-wound Assessment Intact;Pink 11/30/18 1558  Wound Length (cm) 15 cm 11/26/18 1000  Wound Width (cm) 10 cm 11/26/18 1000  Wound Depth (cm) 3 cm 11/26/18 1000  Wound Volume (cm^3) 450 cm^3 11/26/18 1000  Wound Surface Area (cm^2) 150 cm^2 11/26/18 1000  Undermining (cm) 2.8 cm from about 6:00-12:00 11/26/18 1000  Margins Unattached edges (unapproximated) 11/30/18 1558  Closure None 11/30/18 1558  Drainage Amount Moderate 11/30/18 1558  Drainage Description Purulent;Odor 11/30/18 1558  Treatment Cleansed;Debridement (Selective);Hydrotherapy (Pulse lavage);Packing (Saline gauze);Tape changed 11/30/18 1558   Santyl applied to wound bed prior to applying dressing.    Hydrotherapy Pulsed lavage therapy - wound location: Sacrum Pulsed Lavage with Suction (psi): 12 psi Pulsed Lavage with Suction - Normal Saline Used: 1000 mL Pulsed Lavage Tip: Tip with  splash shield Selective Debridement Selective Debridement - Location: Sacrum Selective Debridement - Tools Used: Forceps;Scalpel(would benefit from scissors next session) Selective Debridement - Tissue Removed: black necrotic tissue   Wound Assessment and Plan  Wound Therapy - Assess/Plan/Recommendations Wound Therapy - Clinical Statement: Pt continues to benefit from skilled hydrotherapy to remove necrotic tissue and decreased bioburden.  He does not tolerate debridement well due to back pain when placed in sidelying. Wound Therapy - Functional Problem List: Decreased mobility, AKA, global weakness.  Factors Delaying/Impairing Wound Healing: Diabetes Mellitus;Immobility;Multiple medical problems Hydrotherapy Plan: Debridement;Dressing change;Patient/family education;Pulsatile lavage with suction Wound Therapy - Frequency: 6X / week Wound Therapy - Follow Up Recommendations: Skilled nursing facility Wound Plan: See above  Wound Therapy Goals- Improve the function of patient's integumentary system by progressing the wound(s) through the phases of wound healing (inflammation - proliferation - remodeling) by: Decrease Necrotic Tissue to: 20% Decrease Necrotic Tissue - Progress: Progressing toward goal Increase Granulation Tissue to: 80% Increase Granulation Tissue - Progress: Progressing toward goal Goals/treatment plan/discharge plan were made with and agreed upon by patient/family: Yes Time For Goal Achievement: 7 days Wound Therapy - Potential for Goals: Fair  Goals will be updated until maximal potential achieved or discharge criteria met.  Discharge criteria: when goals achieved, discharge from hospital, MD decision/surgical intervention, no progress towards goals, refusal/missing three consecutive treatments without notification or medical reason.  GP     Syvilla Martin Eli Hose 11/30/2018, 4:08 PM Governor Rooks, PTA Acute Rehabilitation Services Pager [phone] Office  [phone]

## 2018-12-01 LAB — RENAL FUNCTION PANEL
Albumin: 1.8 g/dL — ABNORMAL LOW (ref 3.5–5.0)
Anion gap: 13 (ref 5–15)
BUN: 69 mg/dL — ABNORMAL HIGH (ref 8–23)
CO2: 21 mmol/L — ABNORMAL LOW (ref 22–32)
Calcium: 6.7 mg/dL — ABNORMAL LOW (ref 8.9–10.3)
Chloride: 94 mmol/L — ABNORMAL LOW (ref 98–111)
Creatinine, Ser: 3.93 mg/dL — ABNORMAL HIGH (ref 0.61–1.24)
GFR calc Af Amer: 18 mL/min — ABNORMAL LOW (ref >60)
GFR calc non Af Amer: 15 mL/min — ABNORMAL LOW (ref >60)
Glucose, Bld: 89 mg/dL (ref 70–99)
Phosphorus: 3.7 mg/dL (ref 2.5–4.6)
Potassium: 6.1 mmol/L — ABNORMAL HIGH (ref 3.5–5.1)
Sodium: 128 mmol/L — ABNORMAL LOW (ref 135–145)

## 2018-12-01 LAB — CBC
HCT: 30.8 % — ABNORMAL LOW (ref 39.0–52.0)
Hemoglobin: 8.5 g/dL — ABNORMAL LOW (ref 13.0–17.0)
MCH: 24.6 pg — ABNORMAL LOW (ref 26.0–34.0)
MCHC: 27.6 g/dL — ABNORMAL LOW (ref 30.0–36.0)
MCV: 89.3 fL (ref 80.0–100.0)
Platelets: 113 10*3/uL — ABNORMAL LOW (ref 150–400)
RBC: 3.45 MIL/uL — ABNORMAL LOW (ref 4.22–5.81)
RDW: 17.5 % — ABNORMAL HIGH (ref 11.5–15.5)
WBC: 14.8 10*3/uL — ABNORMAL HIGH (ref 4.0–10.5)
nRBC: 0.5 % — ABNORMAL HIGH (ref 0.0–0.2)

## 2018-12-01 LAB — GLUCOSE, CAPILLARY
Glucose-Capillary: 90 mg/dL (ref 70–99)
Glucose-Capillary: 107 mg/dL — ABNORMAL HIGH (ref 70–99)
Glucose-Capillary: 121 mg/dL — ABNORMAL HIGH (ref 70–99)

## 2018-12-01 MED ORDER — DARBEPOETIN ALFA 100 MCG/0.5ML IJ SOSY
PREFILLED_SYRINGE | INTRAMUSCULAR | Status: AC
  Filled 2018-12-01: qty 0.5

## 2018-12-01 MED ORDER — MIDODRINE HCL 5 MG PO TABS
ORAL_TABLET | ORAL | Status: AC
  Administered 2018-12-01: 13:00:00 10 mg
  Filled 2018-12-01: qty 2

## 2018-12-01 MED ORDER — ALBUMIN HUMAN 25 % IV SOLN
INTRAVENOUS | Status: AC
  Filled 2018-12-01: qty 50

## 2018-12-01 MED ORDER — VANCOMYCIN HCL IN DEXTROSE 1-5 GM/200ML-% IV SOLN
INTRAVENOUS | Status: AC
  Filled 2018-12-01: qty 200

## 2018-12-01 MED ORDER — ALBUMIN HUMAN 25 % IV SOLN
12.5000 g | Freq: Once | INTRAVENOUS | Status: AC
  Administered 2018-12-01: 12.5 g via INTRAVENOUS

## 2018-12-01 NOTE — Progress Notes (Signed)
PROGRESS NOTE    [name]  [id number] DOB: 03-20-1955 DOA: 11/09/2018 PCP: [place]   Brief Narrative:  HPI on 11/11/2018 by Dr. Harrold Donath is a 63 y.o. male with medical history significant of end-stage renal disease on hemodialysis TTS, peripheral vascular disease status post left AKA, right femoral fracture, diabetes, morbid obesity, hypertension, hyperlipidemia presenting to the ER with ulcer in his sacral.  Patient is largely wheelchair and bedbound.  He also spends at least 4 hours at dialysis 3 times a week therefore putting a lot of pressure on his sacral area.  He developed a small ulcer apparently about 4 weeks ago which has now progressed to stage IV sacral decubitus ulcer.  He came in with a pain as well as discharge from the area.  Patient was seen in the ER and evaluated and help to have what appears to be infected decubitus ulcer.  He has some eschar over it.  Has not used anything prior to now.  Denied any fever or chills no nausea vomiting or diarrhea.  Patient is being admitted for management of his decubitus ulcer which may require debridement and aggressive wound care.   Interim history  Patient admitted for sacral decub ulcer. Currently on vancomycin and cefepime and undergoing hydrotherapy. General surgery consulted. Palliative care consulted for symptom management and GOC.  Assessment & Plan   Sacral decubitus ulcer -Stage IV and infected, noted on admission -patient mostly wheelchair/bedbound -S/p bedside debridement 11/25/2018 by general surgery -Continue vancomycin and cefepime -Continue hydrotherapy -No cultures were obtained -No leukocytosis, patient currently afebrile. However, CRP 21.9 -X-ray of sacrum/coccyx showed diffuse bony demineralization.  Sacral ulceration extends to the level of the bone however evaluation is markedly limited.  -complains of more sacral pain today and does not feel morphine helps- have  added IV dilaudid PRN for severe pain or hydrotherapy  -Palliative care consulted and appreciated   Abdominal pain -Recently hospitalized and had perihepatic abscess requiring drain placement and antibiotics -CT abdomen/pelvis small residual collection along the posterior border of the right liver lobe measuring 1.8 x 3.8 x 2.5 cm in size.  Is concerning for residual abscess or infection.  Persistent periureteral stranding increased from prior exams. Distal sigmoid protrudes into a bowel containing right inguinal hernia, no obstruction. New consolidation LLL.   ESRD on HD -Nephrology consulted and appreciated -Patient dialyzes Tuesday, Thursday, Saturday -patient will dialyze today, 10/4  Diabetes mellitus, type II -Continue insulin sliding scale, Lantus, CBG monitoring  Chronic hypertension -Continue midodrine  Hypothyroidism -Continue Synthroid  Hyperlipidemia -Continue statin  Neuropathy -Continue gabapentin  Hypomagnesemia -replaced, continue to monitor   Chronic normocytic anemia -Hemoglobin 9.8, appears to be close to baseline  Obesity -BMI 33.91 -Status post AKA, likely BMI underestimated -Need to follow-up with PCP to discuss lifestyle modifications and interventions  Goals of care -Palliative care consulted and appreciated, for symptom management as well as GOC  DVT Prophylaxis  heparin  Code Status: Full  Family Communication: None at bedside  Disposition Plan: Admitted. Pending improvement of wound with hydrotherapy and pain.  Continues to need IV antibiotics.   Consultants General surgery Nephrology  Palliative Care  Procedures  Bedside I&D  Antibiotics   Anti-infectives (From admission, onward)   Start     Dose/Rate Route Frequency Ordered Stop   12/01/18 1200  vancomycin (VANCOCIN) IVPB 1000 mg/200 mL premix     1,000 mg 200 mL/hr over 60 Minutes Intravenous  Once 11/30/18 1553  11/28/18 1003  vancomycin (VANCOCIN) 1-5 GM/200ML-% IVPB      Note to Pharmacy: Tawanna Solo   : cabinet override      11/28/18 1003 11/28/18 2214   11/26/18 1800  ceFEPIme (MAXIPIME) 2 g in sodium chloride 0.9 % 100 mL IVPB     2 g 200 mL/hr over 30 Minutes Intravenous Every T-Th-Sa (1800) 11/25/18 0916     11/26/18 1659  vancomycin (VANCOCIN) 1-5 GM/200ML-% IVPB    Note to Pharmacy: Cherylann Banas   : cabinet override      11/26/18 1659 11/26/18 2037   11/26/18 1200  vancomycin (VANCOCIN) IVPB 1000 mg/200 mL premix     1,000 mg 200 mL/hr over 60 Minutes Intravenous Every T-Th-Sa (Hemodialysis) 11/25/2018 1916     11/25/18 0945  ceFEPIme (MAXIPIME) 2 g in sodium chloride 0.9 % 100 mL IVPB     2 g 200 mL/hr over 30 Minutes Intravenous STAT 11/25/18 0915 11/25/18 1039   11/21/2018 1930  vancomycin (VANCOCIN) 2,500 mg in sodium chloride 0.9 % 500 mL IVPB     2,500 mg 250 mL/hr over 120 Minutes Intravenous  Once 11/13/2018 1847 11/21/2018 2342      Subjective:   [name] seen and examined today.  Patient still has pain this morning but feels it is controlled at this time. Denies current chest pain, shortness of breath, abdominal pain, N/V/D/C.   Objective:   Vitals:   11/30/18 1509 11/30/18 2143 12/01/18 0434 12/01/18 0503  BP: 93/62 100/71 (!) 86/58   Pulse: 71 83 73   Resp: 16 16 16    Temp: 98.3 F (36.8 C) 98.2 F (36.8 C) (!) 95.9 F (35.5 C) 98.4 F (36.9 C)  TempSrc: Oral Oral Axillary Oral  SpO2: 100% 97% 100%   Weight:      Height:        Intake/Output Summary (Last 24 hours) at 12/01/2018 0750 Last data filed at 11/30/2018 [id number] Gross per 24 hour  Intake 240 ml  Output --  Net 240 ml   Filed Weights   11/28/18 0710 11/28/18 1112 11/29/18 0404  Weight: 120.6 kg 120.1 kg 121.5 kg   Exam  General: Well developed, chronically ill appearing, NAD  HEENT: NCAT,  mucous membranes moist.   Cardiovascular: S1 S2 auscultated, RRR  Respiratory: Clear to auscultation bilaterally with equal chest rise  Abdomen: Soft, obese,  nontender, nondistended, + bowel sounds, +ostomy  Extremities: warm dry without cyanosis clubbing or edema of RLE. L AKA  Neuro: AAOx3, nonfocal  Psych: Appropriate mood and affect  Data Reviewed: I have personally reviewed following labs and imaging studies  CBC: Recent Labs  Lab 11/03/2018 1722 11/25/18 0634 11/26/18 0236 11/28/18 0152 11/29/18 0538 11/30/18 0705  WBC 10.9* 11.2* 10.1 11.7* 11.3* 13.1*  NEUTROABS 9.6*  --  8.6*  --   --   --   HGB 9.8* 10.3* 9.7* 9.4* 9.3* 9.8*  HCT 35.4* 38.1* 33.7* 33.1* 33.0* 34.7*  MCV 91.0 92.3 88.9 88.5 90.4 89.7  PLT 152 133* 127* 137* 111* [id number]*   Basic Metabolic Panel: Recent Labs  Lab 11/25/18 0634 11/26/18 0236 11/28/18 0152 11/29/18 0538 11/30/18 0705  NA 132* 132* 130* 131* 130*  K 4.5 5.1 5.1 4.6 5.5*  CL 95* 95* 93* 95* 94*  CO2 22 22 23 23 25   GLUCOSE 133* 141* 147* 132* 113*  BUN 41* 49* 48* 34* 51*  CREATININE 3.34* 4.02* 3.59* 2.69* 3.27*  CALCIUM 7.0* 6.7* 6.9* 6.9* 7.1*  MG  --  1.5* 1.9 1.8  --   PHOS  --  3.2  --  3.0 3.4   GFR: Estimated Creatinine Clearance: 28.7 mL/min (A) (by C-G formula based on SCr of 3.27 mg/dL (H)). Liver Function Tests: Recent Labs  Lab 11/11/2018 1722 11/25/18 0634 11/26/18 0236 11/29/18 0538 11/30/18 0705  AST 12* 12*  --   --   --   ALT 10 11  --   --   --   ALKPHOS 198* 206*  --   --   --   BILITOT 1.1 1.2  --   --   --   PROT 6.2* 6.4*  --   --   --   ALBUMIN 2.2* 2.3* 1.8* 1.8* 2.1*   No results for input(s): LIPASE, AMYLASE in the last 168 hours. No results for input(s): AMMONIA in the last 168 hours. Coagulation Profile: Recent Labs  Lab 11/20/2018 1722  INR 1.2   Cardiac Enzymes: No results for input(s): CKTOTAL, CKMB, CKMBINDEX, TROPONINI in the last 168 hours. BNP (last 3 results) No results for input(s): PROBNP in the last 8760 hours. HbA1C: No results for input(s): HGBA1C in the last 72 hours. CBG: Recent Labs  Lab 11/29/18 2023 11/30/18 0815  11/30/18 1223 11/30/18 1639 11/30/18 2139  GLUCAP 173* 100* 126* 116* 130*   Lipid Profile: No results for input(s): CHOL, HDL, LDLCALC, TRIG, CHOLHDL, LDLDIRECT in the last 72 hours. Thyroid Function Tests: No results for input(s): TSH, T4TOTAL, FREET4, T3FREE, THYROIDAB in the last 72 hours. Anemia Panel: No results for input(s): VITAMINB12, FOLATE, FERRITIN, TIBC, IRON, RETICCTPCT in the last 72 hours. Urine analysis:    Component Value Date/Time   COLORURINE AMBER (A) 12/27/2016 1601   APPEARANCEUR TURBID (A) 12/27/2016 1601   LABSPEC >1.046 (H) 12/27/2016 1601   PHURINE 5.0 12/27/2016 1601   GLUCOSEU NEGATIVE 12/27/2016 1601   HGBUR NEGATIVE 12/27/2016 1601   BILIRUBINUR NEGATIVE 12/27/2016 1601   BILIRUBINUR neg 08/10/2014 1637   KETONESUR NEGATIVE 12/27/2016 1601   PROTEINUR NEGATIVE 12/27/2016 1601   UROBILINOGEN 1.0 08/10/2014 1637   UROBILINOGEN 0.2 08/03/2014 1225   NITRITE NEGATIVE 12/27/2016 1601   LEUKOCYTESUR TRACE (A) 12/27/2016 1601   Sepsis Labs: @LABRCNTIP (procalcitonin:4,lacticidven:4)  ) Recent Results (from the past 240 hour(s))  SARS CORONAVIRUS 2 (TAT 6-24 HRS) Nasopharyngeal Nasopharyngeal Swab     Status: None   Collection Time: 11/14/2018  3:10 PM   Specimen: Nasopharyngeal Swab  Result Value Ref Range Status   SARS Coronavirus 2 NEGATIVE NEGATIVE Final    Comment: (NOTE) SARS-CoV-2 target nucleic acids are NOT DETECTED. The SARS-CoV-2 RNA is generally detectable in upper and lower respiratory specimens during the acute phase of infection. Negative results do not preclude SARS-CoV-2 infection, do not rule out co-infections with other pathogens, and should not be used as the sole basis for treatment or other patient management decisions. Negative results must be combined with clinical observations, patient history, and epidemiological information. The expected result is Negative. Fact Sheet for  Patients: SugarRoll.be Fact Sheet for Healthcare Providers: https://www.woods-mathews.com/ This test is not yet approved or cleared by the [place] FDA and  has been authorized for detection and/or diagnosis of SARS-CoV-2 by FDA under an Emergency Use Authorization (EUA). This EUA will remain  in effect (meaning this test can be used) for the duration of the COVID-19 declaration under Section 56 4(b)(1) of the Act, 21 U.S.C. section 360bbb-3(b)(1), unless the authorization is terminated or revoked sooner. Performed at [facility], [place] [address], [place], [place] [address]       Radiology Studies: No results found.   Scheduled Meds:  Chlorhexidine Gluconate Cloth  6 each Topical Q0600   Chlorhexidine Gluconate Cloth  6 each Topical Q0600   collagenase   Topical Daily   darbepoetin (ARANESP) injection - DIALYSIS  100 mcg Intravenous Q Sat-HD   darbepoetin (ARANESP) injection - DIALYSIS  100 mcg Intravenous Once   feeding supplement (PRO-STAT SUGAR FREE 64)  30 mL Oral BID   gabapentin  100 mg Oral TID   heparin  5,000 Units Subcutaneous Q8H   insulin aspart  0-9 Units Subcutaneous TID WC   insulin glargine  20 Units Subcutaneous QHS   levothyroxine  125 mcg Oral Q0600   midodrine  15 mg Oral TID WC   sevelamer carbonate  800 mg Oral TID WC   Continuous Infusions:  ceFEPime (MAXIPIME) IV 2 g (11/30/18 1803)   vancomycin Stopped (11/28/18 1208)   vancomycin       LOS: 7 days   Time Spent in minutes   30 minutes  Adja Ruff D.O. on 12/01/2018 at 7:50 AM  Between 7am to 7pm - Please see pager noted on [email or web address]  After 7pm go to [email or web address]  And look for the night coverage person covering for me after hours  [facility] Office  [phone]

## 2018-12-01 NOTE — Progress Notes (Addendum)
Subjective:  Co being tired / For HD today off schedule secondary to more emergent cases yesterday   Objective Vital signs in last 24 hours: Vitals:   11/30/18 1509 11/30/18 2143 12/01/18 0434 12/01/18 0503  BP: 93/62 100/71 (!) 86/58   Pulse: 71 83 73   Resp: 16 16 16    Temp: 98.3 F (36.8 C) 98.2 F (36.8 C) (!) 95.9 F (35.5 C) 98.4 F (36.9 C)  TempSrc: Oral Oral Axillary Oral  SpO2: 100% 97% 100%   Weight:      Height:       Weight change:   Physical Exam General: Obese male NAD, alert  Heart: RRR , no mur. , rub/or gallop  Lungs: CTAB anter. non labored  Abdomen: obese ,soft NT +BS Extremities: L AKA trace stump edema  Dialysis Access: RUE AVF aneurysmal, pulsatile bruit   Dialysis Orders: [facility] on [address]  TTS   3h 110min   113kg (250 lbs)    2/2.5 bath  Heparin none   R AVF No VDRA Mircera 200 last 8/27  Assessment/Plan: 1. Stage IV sacral decubitus ulcer - Limited mobility. Mostly wheelchair/bedbound. S/P bedside debridement per surgery 9/28.  Hydrotherapy,  Vanc, cefepime - per primary.Per PMD notes has been at [place] [facility] in the past.  2. ESRD -HD TTS. Continue on schedule. HD today off schedule  3. Hypotension/volume: On midodrine for BP support.No gross vol excess on exam.   4. Anemia - Hgb 10.1 >9.4>8.5 to get  Aranesp  100 with HD 10/04  Then weekly next Tuesday  5. Metabolic bone disease -Corr Ca 9.0 /alb 1.8 ,  No Vit D as outpatient. Continue binders. 6. Nutrition -Prot supp for low albumin  7. DM -insulin per primary  8. S/p cystectomy with urostomy  Ernest Haber, PA-C [facility] [phone] 12/01/2018,9:40 AM  LOS: 7 days   Pt seen, examined and agree w A/P as above.  Kelly Splinter  MD 12/01/2018, 4:40 PM    Labs: Basic Metabolic Panel: Recent Labs  Lab 11/29/18 0538 11/30/18 0705 12/01/18 0809  NA 131* 130* 128*  K 4.6 5.5* 6.1*  CL 95* 94* 94*  CO2 23 25 21*  GLUCOSE 132* 113* 89  BUN 34* 51*  69*  CREATININE 2.69* 3.27* 3.93*  CALCIUM 6.9* 7.1* 6.7*  PHOS 3.0 3.4 3.7   Liver Function Tests: Recent Labs  Lab 11/01/2018 1722 11/25/18 0634  11/29/18 0538 11/30/18 0705 12/01/18 0809  AST 12* 12*  --   --   --   --   ALT 10 11  --   --   --   --   ALKPHOS 198* 206*  --   --   --   --   BILITOT 1.1 1.2  --   --   --   --   PROT 6.2* 6.4*  --   --   --   --   ALBUMIN 2.2* 2.3*   < > 1.8* 2.1* 1.8*   < > = values in this interval not displayed.   No results for input(s): LIPASE, AMYLASE in the last 168 hours. No results for input(s): AMMONIA in the last 168 hours. CBC: Recent Labs  Lab 11/16/2018 1722 11/25/18 [id number] 11/26/18 0236 11/28/18 0152 11/29/18 0538 11/30/18 0705  WBC 10.9* 11.2* 10.1 11.7* 11.3* 13.1*  NEUTROABS 9.6*  --  8.6*  --   --   --   HGB 9.8* 10.3* 9.7* 9.4* 9.3* 9.8*  HCT 35.4* 38.1*  33.7* 33.1* 33.0* 34.7*  MCV 91.0 92.3 88.9 88.5 90.4 89.7  PLT 152 133* 127* 137* 111* 116*   Cardiac Enzymes: No results for input(s): CKTOTAL, CKMB, CKMBINDEX, TROPONINI in the last 168 hours. CBG: Recent Labs  Lab 11/30/18 0815 11/30/18 1223 11/30/18 1639 11/30/18 2139 12/01/18 0809  GLUCAP 100* 126* 116* 130* 90    Studies/Results: No results found. Medications: . ceFEPime (MAXIPIME) IV 2 g (11/30/18 1803)  . vancomycin Stopped (11/28/18 1208)  . vancomycin     . Chlorhexidine Gluconate Cloth  6 each Topical Q0600  . Chlorhexidine Gluconate Cloth  6 each Topical Q0600  . collagenase   Topical Daily  . darbepoetin (ARANESP) injection - DIALYSIS  100 mcg Intravenous Q Sat-HD  . darbepoetin (ARANESP) injection - DIALYSIS  100 mcg Intravenous Once  . feeding supplement (PRO-STAT SUGAR FREE 64)  30 mL Oral BID  . gabapentin  100 mg Oral TID  . heparin  5,000 Units Subcutaneous Q8H  . insulin aspart  0-9 Units Subcutaneous TID WC  . insulin glargine  20 Units Subcutaneous QHS  . levothyroxine  125 mcg Oral Q0600  . midodrine  15 mg Oral TID WC  .  sevelamer carbonate  800 mg Oral TID WC

## 2018-12-01 NOTE — Significant Event (Signed)
Rapid Response Event Note  Overview: Time Called: [id number] Arrival Time: 1705 Event Type: Respiratory  Initial Focused Assessment: Patient has just returned from HD.  Per NT O2 sats in the 70s.  O2 increased to 6L [place] O2 sats 99% Lung sounds clear He is awake and alert.  Per staff his color is better now than when he 1st arrived. Skin is cool to touch BP 87/53  HR 89  RR 18  O2 sat 97  Interventions: Repositioned in bed HOB elevated Encouraged deep breathing and cough. He has multiple complaints of feeling SOB, like his blood sugar is "dropping" and his bottom and back  Is hurting.  He states that he wears a "trilogy bipap" at home but "isn't allowed to wear it here because it will drop his blood pressure".    Dr Ree Kida notified of patient status.  Orders received for qhs bipap   Plan of Care (if not transferred):  Event Summary: Name of Physician Notified: Dr Ree Kida at 1715    at    Outcome: Stayed in room and stabalized  Event End Time: [place]  Raliegh Ip

## 2018-12-01 NOTE — Progress Notes (Signed)
Pt returned to 6N27 via bed from dialysis. Received report from [place], Therapist, sports. Will continue to monitor.

## 2018-12-01 NOTE — Progress Notes (Signed)
RT attempted ABG x2 unsuccessful. Second RT attempted with Doppler x2 and was also unsuccessful. RN was made aware and called MD. Patient is stable at this time.

## 2018-12-01 NOTE — Progress Notes (Signed)
Pharmacy Antibiotic Note  [name] is a 63 y.o. male admitted on 11/12/2018 with wound infection /decubitus ulcer.  Pharmacy consulted to dose Vancomycin and Cefepime for sepsis due to stage I sacral decubitus ulcer. ESRD on HD TTS. Pt missed HD on 10/3, re-scheduled for today (10/4).  One-time vancomycin entered for today at 1200 after HD. WBC 13.1, afebrile. S/p bedside debridement 11/25/2018  Plan: Continue Cefepime 2 g IV qHD-TTS. Vancomycin 1000mg  x1 at 1200 (after HD) today then resume Vancomyin 1000 mg after each HD on TTS F/u HD schedule F/u WBC, temp, clinical course  Height: 5\' 5"  (165.1 cm) Weight: 267 lb 13.7 oz (121.5 kg) IBW/kg (Calculated) : 61.5  Temp (24hrs), Avg:97.7 F (36.5 C), Min:95.9 F (35.5 C), Max:98.4 F (36.9 C)  Recent Labs  Lab 10/31/2018 1723 11/25/18 0634 11/26/18 0236 11/28/18 0152 11/29/18 0538 11/30/18 0705  WBC  --  11.2* 10.1 11.7* 11.3* 13.1*  CREATININE  --  3.34* 4.02* 3.59* 2.69* 3.27*  LATICACIDVEN 2.5* 2.3*  --   --   --   --     Estimated Creatinine Clearance: 28.7 mL/min (A) (by C-G formula based on SCr of 3.27 mg/dL (H)).    No Known Allergies  Antimicrobials this admission: 9/27 Vanc>> 9/28 Cefepime>>  Dose adjustments this admission: Rescheduled vanc from 10/3 >> 10/4  Microbiology results: 9/27 COVID : negative   Thank you for the interesting consult and for involving pharmacy in this patient's care.  Tamela Gammon, PharmD 12/01/2018 8:56 AM PGY-2 Pharmacy Administration Resident Direct Phone: [phone] Please check [email or web address] for unit-specific pharmacist phone numbers

## 2018-12-01 NOTE — Progress Notes (Signed)
BiPAP set up at bedside pt unsure of settings.Bipap set to Auto with FFM per home use. Pt states he is not ready to wear yet and I told him to call RT when he is ready to try for tonight.

## 2018-12-01 NOTE — Plan of Care (Signed)
  Problem: Education: Goal: Knowledge of General Education information will improve Description: Including pain rating scale, medication(s)/side effects and non-pharmacologic comfort measures Outcome: Progressing   Problem: Health Behavior/Discharge Planning: Goal: Ability to manage health-related needs will improve Outcome: Progressing   Problem: Clinical Measurements: Goal: Respiratory complications will improve Outcome: Progressing Goal: Cardiovascular complication will be avoided Outcome: Progressing   Problem: Nutrition: Goal: Adequate nutrition will be maintained Outcome: Progressing   Problem: Elimination: Goal: Will not experience complications related to bowel motility Outcome: Progressing Goal: Will not experience complications related to urinary retention Outcome: Progressing   Problem: Pain Managment: Goal: General experience of comfort will improve Outcome: Progressing   Problem: Safety: Goal: Ability to remain free from injury will improve Outcome: Progressing   Problem: Skin Integrity: Goal: Risk for impaired skin integrity will decrease Outcome: Progressing   

## 2018-12-01 NOTE — Progress Notes (Signed)
Daily Progress Note   Patient Name: [name]       Date: 12/01/2018 DOB: [birth year]  Age: 63 y.o. MRN#: [id number] Attending Physician: Cristal Ford, DO Primary Care Physician: Thatcher Date: 11/03/2018  Reason for Consultation/Follow-up: Establishing goals of care and Pain control  Subjective: Patient is in HD so I spoke with his wife for a few minutes.  She seems resigned to taking care of a chronically ill person - that seems normal to her.  We discussed his pain.  Hydrotherapy is significantly painful to him.  Sandy states dilaudid drops his BP.  We discussed switching him to fentanyl.   If PRN fentanyl works well perhaps a fentanyl patch would provide good basal coverage. Lovey Newcomer states that Crane has a patch prescribed to him but it is at the [employer] where she works - she has not given it to him.    I asked Lovey Newcomer about the midodrine as his BP is always low. She commented that it was just recently increased to 15 TID from BID.  I met with Harrie Jeans in HD.  He moaned "oh Lord I'm not feeling well".  I asked him what hurt and he replied that he felt bad all over.  The HD RN turn of his HD machine for a few minutes to give him a break - his systolic BP was 83.  I asked Harrie Jeans about pain medications.  He told me that fentanyl did not work for him and he preferred to stay with dilaudid.  Then he started moaning "Oh Lord".    I asked him about [facility].  He stated he was OK with going back there again.   Assessment: 63 yo admitted with infected sacral decub.  Significant pain from wound.  Albumin 1.8 (poor wound healing).  Systolic consistently in the 80s despite midodrine. On going liver abscess.   I'm concerned that he will not only have great difficulty healing this  wound but that his BP will not tolerate HD long term.   Patient Profile/HPI:  63 y.o. male  with past medical history of ESRD on HD, PVD s/p L AKA, DVT, anxiety, urostomy, DM, and a recent admission at [facility] with a liver abscess that required drainage and antibiotics who was admitted on 11/07/2018 with an infected stage  4 sacral decubitus ulceration.  He underwent debridement on 9/28.  He has had difficulty this hospitalization with hypotension, pain, and lethargy.  Imaging shows he still has a fluid collection in his liver that is 3.8 x 2.5 cm.    Length of Stay: 7  Current Medications: Scheduled Meds:   Chlorhexidine Gluconate Cloth  6 each Topical Q0600   Chlorhexidine Gluconate Cloth  6 each Topical Q0600   collagenase   Topical Daily   darbepoetin (ARANESP) injection - DIALYSIS  100 mcg Intravenous Q Sat-HD   darbepoetin (ARANESP) injection - DIALYSIS  100 mcg Intravenous Once   feeding supplement (PRO-STAT SUGAR FREE 64)  30 mL Oral BID   gabapentin  100 mg Oral TID   heparin  5,000 Units Subcutaneous Q8H   insulin aspart  0-9 Units Subcutaneous TID WC   insulin glargine  20 Units Subcutaneous QHS   levothyroxine  125 mcg Oral Q0600   midodrine  15 mg Oral TID WC   sevelamer carbonate  800 mg Oral TID WC    Continuous Infusions:  ceFEPime (MAXIPIME) IV 2 g (11/30/18 1803)   vancomycin Stopped (11/28/18 1208)   vancomycin      PRN Meds: acetaminophen **OR** acetaminophen, HYDROmorphone (DILAUDID) injection, loperamide HCl, ondansetron **OR** ondansetron (ZOFRAN) IV, oxyCODONE  Physical Exam       Obese chronically ill appearing male, awake, responsive but moaning in discomfort on HD CV rrr resp no distress Abdomen obese, nt, nd  Vital Signs: BP (!) 89/61    Pulse 75    Temp 97.9 F (36.6 C) (Oral)    Resp 18    Ht '5\' 5"'  (1.651 m)    Wt 122 kg    SpO2 100%    BMI 44.76 kg/m  SpO2: SpO2: 100 % O2 Device: O2 Device: Nasal Cannula O2 Flow  Rate: O2 Flow Rate (L/min): 4 L/min  Intake/output summary: No intake or output data in the 24 hours ending 12/01/18 1348 LBM: Last BM Date: 11/30/18 Baseline Weight: Weight: 113.4 kg Most recent weight: Weight: 122 kg       Palliative Assessment/Data: 30%      Patient Active Problem List   Diagnosis Date Noted   Palliative care encounter    Chronic respiratory failure with hypoxia ([facility])    Decubitus ulcer of sacral region, stage 4 ([place]) 11/18/2018   Sleep apnea    Nephrolithiasis    Impaired mobility and activities of daily living 09/27/2018   History of noncompliance with medical treatment, presenting hazards to health 09/24/2018   Acute on chronic respiratory failure with hypoxia and hypercapnia (HCC) 09/23/2018   History of gastric polyp 02/13/2018   Sedentary lifestyle 06/13/2017   Pressure injury of skin 03/12/2017   S/P AKA (above knee amputation) unilateral, left ([place]) 03/09/2017   DDD (degenerative disc disease), lumbosacral    Thrombocytopenia (HCC)    Idiopathic chronic venous hypertension of right lower extremity with inflammation    Lower back pain 12/27/2016   UTI (urinary tract infection) 12/27/2016   Anemia, chronic disease 09/05/2016   Closed fracture of distal end of right femur ([place]) 09/05/2016   Hemodialysis-associated hypotension 11/24/2015   Left knee pain 11/24/2015   Hypotension 11/24/2015   Unstageable pressure ulcer of right buttock ([place]) 10/05/2014   Chronic venous insufficiency 09/08/2014   ESRD on dialysis ([place]) 09/08/2014   Diabetic neuropathy, type II diabetes mellitus ([place]) 09/08/2014   Hyperlipidemia 12/23/2013   Diabetic foot ([place]) 11/20/2013   Class 3 severe obesity  with body mass index (BMI) of 40.0 to 44.9 in adult [facility]) 07/04/2013   Chronic pain 05/21/2013   Chronic interstitial cystitis 02/06/2013   UI (urinary incontinence) 10/29/2012   End stage renal disease ([place]) 08/18/2012   Acute  respiratory failure with hypoxia ([place]) 08/14/2012   Hyperkalemia 08/04/2012   Hypothyroidism, acquired 08/04/2012   OBESITY 09/21/2009   Essential hypertension 09/21/2009   DIVERTICULOSIS OF COLON 09/21/2009   Type 2 diabetes mellitus with diabetic nephropathy, with long-term current use of insulin ([place]) 10/01/2007   DVT 10/01/2007    Palliative Care Plan    Recommendations/Plan:  Very concerned that his prognosis is becoming very poor.  He and his wife do not understand his prognosis as he has been very sick for a long time - this has become their normal.  Full code, full scope.  Will not change pain medications for now.    Consider fentanyl as it may have less of an impact on the BP and a transdermal patch may provide basal coverage.    PMT will follow intermittently.  Please call us if we are needed urgently.  Goals of Care and Additional Recommendations:  Limitations on Scope of Treatment: Full Scope Treatment  Code Status:  Full code  Prognosis:   Unable to determine   Discharge Planning:  Would likely be best served at [facility] for HD and wound care.  Has been to [facility] in the past and done well there.  Care plan was discussed with Renal PA, patient and wife.  Thank you for allowing the Palliative Medicine Team to assist in the care of this patient.  Total time spent:  35 min.     Greater than 50%  of this time was spent counseling and coordinating care related to the above assessment and plan.  Florentina Jenny, PA-C Palliative Medicine  Please contact Palliative MedicineTeam phone at [phone] for questions and concerns between 7 am - 7 pm.   Please see AMION for individual provider pager numbers.

## 2018-12-01 NOTE — Progress Notes (Signed)
Patient refused to have his dressing change done multiple times last night. Will endorse to the oncoming shift.

## 2018-12-02 LAB — GLUCOSE, CAPILLARY
Glucose-Capillary: 89 mg/dL (ref 70–99)
Glucose-Capillary: 117 mg/dL — ABNORMAL HIGH (ref 70–99)
Glucose-Capillary: 97 mg/dL (ref 70–99)
Glucose-Capillary: 122 mg/dL — ABNORMAL HIGH (ref 70–99)

## 2018-12-02 LAB — RENAL FUNCTION PANEL
Albumin: 2 g/dL — ABNORMAL LOW (ref 3.5–5.0)
Anion gap: 12 (ref 5–15)
BUN: 39 mg/dL — ABNORMAL HIGH (ref 8–23)
CO2: 23 mmol/L (ref 22–32)
Calcium: 6.8 mg/dL — ABNORMAL LOW (ref 8.9–10.3)
Chloride: 95 mmol/L — ABNORMAL LOW (ref 98–111)
Creatinine, Ser: 2.73 mg/dL — ABNORMAL HIGH (ref 0.61–1.24)
GFR calc Af Amer: 28 mL/min — ABNORMAL LOW (ref >60)
GFR calc non Af Amer: 24 mL/min — ABNORMAL LOW (ref >60)
Glucose, Bld: 95 mg/dL (ref 70–99)
Phosphorus: 2.9 mg/dL (ref 2.5–4.6)
Potassium: 4.9 mmol/L (ref 3.5–5.1)
Sodium: 130 mmol/L — ABNORMAL LOW (ref 135–145)

## 2018-12-02 LAB — MAGNESIUM: Magnesium: 1.8 mg/dL (ref 1.7–2.4)

## 2018-12-02 MED ORDER — CHLORHEXIDINE GLUCONATE CLOTH 2 % EX PADS
6.0000 | MEDICATED_PAD | Freq: Every day | CUTANEOUS | Status: DC
  Administered 2018-12-03 – 2018-12-05 (×3): 6 via TOPICAL

## 2018-12-02 MED ORDER — DAKINS (1/2 STRENGTH) 0.25 % EX SOLN
CUTANEOUS | Status: DC | PRN
  Administered 2018-12-07 – 2018-12-08 (×2)
  Filled 2018-12-02 (×3): qty 473

## 2018-12-02 NOTE — TOC Progression Note (Signed)
Transition of Care [facility]) - Progression Note    Patient Details  Name: [name] MRN: [id number] Date of Birth: 12-12-1955  Transition of Care [facility] CM/SW Contact  Jacalyn Lefevre Edson Snowball, RN Phone Number: 12/02/2018, 4:13 PM  Clinical Narrative:     Patient from home with wife. Consult for LTAC . Discussed LTAC with patient , patient agreeable for referrals to [facility] and [facility] . Called both awaiting call back.  Expected Discharge Plan: [place] (LTAC)    Expected Discharge Plan and Services Expected Discharge Plan: Long Term Acute Care (LTAC)   Discharge Planning Services: CM Consult   Living arrangements for the past 2 months: Single Family Home Expected Discharge Date: 12/02/18               DME Arranged: N/A         HH Arranged: NA           Social Determinants of Health (SDOH) Interventions    Readmission Risk Interventions No flowsheet data found.

## 2018-12-02 NOTE — Progress Notes (Signed)
Subjective:  Patient had HD on 10/4 off schedule due to staffing.  Had 400 mL UF.     Review of systems:  Denies overt shortness of breath  reports on oxygen at home 4 liters  Denies n/v No cp   Objective Vital signs in last 24 hours: Vitals:   12/01/18 1624 12/01/18 1711 12/02/18 0437 12/02/18 1216  BP: (!) 88/59 (!) 87/53 (!) 87/60 (!) 99/59  Pulse: 80 89 70 73  Resp: 18 18  20   Temp: 98 F (36.7 C) 98 F (36.7 C) 98.9 F (37.2 C) 97.7 F (36.5 C)  TempSrc: Oral Oral  Oral  SpO2: 100% 97% (!) 87% 100%  Weight: 121.7 kg     Height:       Weight change:   Physical Exam General: Obese male NAD, alert   Heart: RRR , no rub Lungs: Clear but reduced anteriorly; non labored  Abdomen: obese ,soft NT +BS Extremities: L AKA edema; 1+ edema residual limb and LE Dialysis Access: RUE AVF aneurysmal, pulsatile bruit   Dialysis Orders: [facility] on [address]  TTS   3h 70min   113kg (250 lbs)    2/2.5 bath  Heparin none   R AVF No VDRA Mircera 200 last 8/27  Assessment/Plan: 1. Stage IV sacral decubitus ulcer - Limited mobility. Mostly wheelchair/bedbound. S/P bedside debridement per surgery 9/28.  Hydrotherapy,  Vanc, cefepime - per primary.Per PMD notes has been at [facility] in the past.   2. ESRD -HD per TTS schedule  3. Hyperkalemia - improved with HD  4. Hypotension/volume: On midodrine for BP support.No gross vol excess on exam.    5. Anemia - Hgb 10.1 >9.4>8.5 to get  Aranesp  100 with HD 10/04.  Then weekly    6. Metabolic bone disease - corrected calcium 8.4. adjust Ca in bath. No Vit D as outpatient. Continue binders.   7. DM -insulin per primary   8. S/p cystectomy with urostomy   Labs: Basic Metabolic Panel: Recent Labs  Lab 11/30/18 0705 12/01/18 0809 12/02/18 1106  NA 130* 128* 130*  K 5.5* 6.1* 4.9  CL 94* 94* 95*  CO2 25 21* 23  GLUCOSE 113* 89 95  BUN 51* 69* 39*  CREATININE 3.27* 3.93* 2.73*  CALCIUM 7.1* 6.7* 6.8*  PHOS 3.4 3.7  2.9   Liver Function Tests: Recent Labs  Lab 11/30/18 0705 12/01/18 0809 12/02/18 1106  ALBUMIN 2.1* 1.8* 2.0*   No results for input(s): LIPASE, AMYLASE in the last 168 hours. No results for input(s): AMMONIA in the last 168 hours. CBC: Recent Labs  Lab 11/26/18 0236 11/28/18 0152 11/29/18 0538 11/30/18 0705 12/01/18 0917  WBC 10.1 11.7* 11.3* 13.1* 14.8*  NEUTROABS 8.6*  --   --   --   --   HGB 9.7* 9.4* 9.3* 9.8* 8.5*  HCT 33.7* 33.1* 33.0* 34.7* 30.8*  MCV 88.9 88.5 90.4 89.7 89.3  PLT 127* 137* 111* 116* 113*   Cardiac Enzymes: No results for input(s): CKTOTAL, CKMB, CKMBINDEX, TROPONINI in the last 168 hours. CBG: Recent Labs  Lab 12/01/18 0809 12/01/18 1705 12/01/18 2107 12/02/18 0818 12/02/18 1213  GLUCAP 90 107* 121* 89 117*    Studies/Results: No results found. Medications: . ceFEPime (MAXIPIME) IV 2 g (12/01/18 1536)  . vancomycin 1,000 mg (12/01/18 1432)  . vancomycin     . Chlorhexidine Gluconate Cloth  6 each Topical Q0600  . collagenase   Topical Daily  . darbepoetin (ARANESP) injection - DIALYSIS  100 mcg Intravenous Q Sat-HD  . feeding supplement (PRO-STAT SUGAR FREE 64)  30 mL Oral BID  . gabapentin  100 mg Oral TID  . heparin  5,000 Units Subcutaneous Q8H  . insulin aspart  0-9 Units Subcutaneous TID WC  . insulin glargine  20 Units Subcutaneous QHS  . levothyroxine  125 mcg Oral Q0600  . midodrine  15 mg Oral TID WC  . sevelamer carbonate  800 mg Oral TID WC

## 2018-12-02 NOTE — Progress Notes (Signed)
[facility]uma Progress Note      Assessment/Plan DM ESRD - TTS Hypotension Hypothyroidism HLD S/p Left AKA  Sacral wound - cleaning up some.  - cont hydrotherapy and add dakins for likely pseudomonas.  No further surgical intervention at this time - will see a couple times a week to follow progress  ID -maxipime/vancomycin 9/27>> VTE -SCDs, sq heparin FEN -CM/HH diet Foley -urostomy Follow up -TBD    LOS: 8 days    Subjective: CC: pain of sacral wound  Pt is undergoing hydrotherapy and he states it is painful. He states he is mainly wheelchair bound.  Objective: Vital signs in last 24 hours: Temp:  [97.9 F (36.6 C)-98.9 F (37.2 C)] 98.9 F (37.2 C) (10/05 0437) Pulse Rate:  [58-89] 70 (10/05 0437) Resp:  [18] 18 (10/04 1711) BP: (79-89)/(43-61) 87/60 (10/05 0437) SpO2:  [87 %-100 %] 87 % (10/05 0437) Weight:  [121.7 kg-122 kg] 121.7 kg (10/04 1624) Last BM Date: 11/30/18  Intake/Output from previous day: 10/04 0701 - 10/05 0700 In: -  Out: 484 [Urine:50] Intake/Output this shift: Total I/O In: 360 [P.O.:360] Out: -   PE: Gen:  Alert, NAD Pulm:  Rate and effort normal Skin: no rashes noted, warm and dry, sacral wound see photo below. Sweet smelling odor of wound       Anti-infectives: Anti-infectives (From admission, onward)   Start     Dose/Rate Route Frequency Ordered Stop   12/01/18 1425  vancomycin (VANCOCIN) 1-5 GM/200ML-% IVPB    Note to Pharmacy: Tawanna Solo   : cabinet override      12/01/18 1425 12/02/18 0229   12/01/18 1200  vancomycin (VANCOCIN) IVPB 1000 mg/200 mL premix     1,000 mg 200 mL/hr over 60 Minutes Intravenous  Once 11/30/18 1553     11/28/18 1003  vancomycin (VANCOCIN) 1-5 GM/200ML-% IVPB    Note to Pharmacy: Tawanna Solo   : cabinet override      11/28/18 1003 11/28/18 2214   11/26/18 1800  ceFEPIme (MAXIPIME) 2 g in sodium chloride 0.9 % 100 mL IVPB     2 g 200 mL/hr over 30 Minutes  Intravenous Every T-Th-Sa (1800) 11/25/18 0916     11/26/18 1659  vancomycin (VANCOCIN) 1-5 GM/200ML-% IVPB    Note to Pharmacy: Cherylann Banas   : cabinet override      11/26/18 1659 11/26/18 2037   11/26/18 1200  vancomycin (VANCOCIN) IVPB 1000 mg/200 mL premix     1,000 mg 200 mL/hr over 60 Minutes Intravenous Every T-Th-Sa (Hemodialysis) 11/23/2018 1916     11/25/18 0945  ceFEPIme (MAXIPIME) 2 g in sodium chloride 0.9 % 100 mL IVPB     2 g 200 mL/hr over 30 Minutes Intravenous STAT 11/25/18 0915 11/25/18 1039   11/25/2018 1930  vancomycin (VANCOCIN) 2,500 mg in sodium chloride 0.9 % 500 mL IVPB     2,500 mg 250 mL/hr over 120 Minutes Intravenous  Once 11/11/2018 1847 11/15/2018 2342      Lab Results:  Recent Labs    11/30/18 0705 12/01/18 0917  WBC 13.1* 14.8*  HGB 9.8* 8.5*  HCT 34.7* 30.8*  PLT 116* 113*   BMET Recent Labs    11/30/18 0705 12/01/18 0809  NA 130* 128*  K 5.5* 6.1*  CL 94* 94*  CO2 25 21*  GLUCOSE 113* 89  BUN 51* 69*  CREATININE 3.27* 3.93*  CALCIUM 7.1* 6.7*   PT/INR No results for input(s): LABPROT, INR in the last  72 hours. CMP     Component Value Date/Time   NA 128 (L) 12/01/2018 0809   NA 136 (A) 04/29/2015   NA 140 03/10/2008 1321   K 6.1 (H) 12/01/2018 0809   K 4.1 03/10/2008 1321   CL 94 (L) 12/01/2018 0809   CL 99 03/10/2008 1321   CO2 21 (L) 12/01/2018 0809   CO2 30 03/10/2008 1321   GLUCOSE 89 12/01/2018 0809   GLUCOSE 163 (H) 03/10/2008 1321   BUN 69 (H) 12/01/2018 0809   BUN 15 04/29/2015   BUN 14 03/10/2008 1321   CREATININE 3.93 (H) 12/01/2018 0809   CREATININE 5.73 (H) 07/17/2013 1557   CALCIUM 6.7 (L) 12/01/2018 0809   CALCIUM 9.4 03/10/2008 1321   PROT 6.4 (L) 11/25/2018 0634   PROT 6.9 03/10/2008 1321   ALBUMIN 1.8 (L) 12/01/2018 0809   ALBUMIN 3.4 03/10/2008 1321   AST 12 (L) 11/25/2018 0634   AST 21 03/10/2008 1321   ALT 11 11/25/2018 0634   ALT 20 03/10/2008 1321   ALKPHOS 206 (H) 11/25/2018 0634   ALKPHOS 69  03/10/2008 1321   BILITOT 1.2 11/25/2018 0634   BILITOT 0.80 03/10/2008 1321   GFRNONAA 15 (L) 12/01/2018 0809   GFRNONAA 23 (L) 03/07/2013 1719   GFRAA 18 (L) 12/01/2018 0809   GFRAA 26 (L) 03/07/2013 1719   Lipase     Component Value Date/Time   LIPASE 11 10/10/2012 1306    Studies/Results: No results found.   Kalman Drape, [facility] Surgery Pager [phone] Cristine Polio, & Friday 7:00am - 4:30pm Thursdays 7:00am -11:30am

## 2018-12-02 NOTE — Progress Notes (Signed)
Patient refused dressing change multiple times during my shift. Educated patient on importance of dressing changed for healing process and he stated he just didn't feel like doing it and he just wanted to be left alone.

## 2018-12-02 NOTE — Plan of Care (Signed)
  Problem: Education: Goal: Knowledge of General Education information will improve Description: Including pain rating scale, medication(s)/side effects and non-pharmacologic comfort measures Outcome: Progressing   Problem: Health Behavior/Discharge Planning: Goal: Ability to manage health-related needs will improve Outcome: Progressing   Problem: Clinical Measurements: Goal: Respiratory complications will improve Outcome: Progressing Goal: Cardiovascular complication will be avoided Outcome: Progressing   Problem: Nutrition: Goal: Adequate nutrition will be maintained Outcome: Progressing   Problem: Elimination: Goal: Will not experience complications related to bowel motility Outcome: Progressing Goal: Will not experience complications related to urinary retention Outcome: Progressing   Problem: Pain Managment: Goal: General experience of comfort will improve Outcome: Progressing   Problem: Safety: Goal: Ability to remain free from injury will improve Outcome: Progressing   Problem: Skin Integrity: Goal: Risk for impaired skin integrity will decrease Outcome: Progressing   

## 2018-12-02 NOTE — Progress Notes (Signed)
[facility] Progress Note   Subjective: Seen post wound care. Crying, "it hurts so bad".     Objective Vitals:   12/01/18 1600 12/01/18 1624 12/01/18 1711 12/02/18 0437  BP: (!) 83/48 (!) 88/59 (!) 87/53 (!) 87/60  Pulse: 64 80 89 70  Resp:  18 18   Temp:  98 F (36.7 C) 98 F (36.7 C) 98.9 F (37.2 C)  TempSrc:  Oral Oral   SpO2:  100% 97% (!) 87%  Weight:  121.7 kg    Height:       Physical Exam General: Obese, chronically ill appearing male in NAD Heart: S1,S2 RRR Lungs: CTAB A/P Abdomen: Active BS, obese, NT Extremities: L AKA with 1+ stump edema. LLE trace edema. He Dialysis Access: R AVF + bruit    Additional Objective Labs: Basic Metabolic Panel: Recent Labs  Lab 11/29/18 0538 11/30/18 0705 12/01/18 0809  NA 131* 130* 128*  K 4.6 5.5* 6.1*  CL 95* 94* 94*  CO2 23 25 21*  GLUCOSE 132* 113* 89  BUN 34* 51* 69*  CREATININE 2.69* 3.27* 3.93*  CALCIUM 6.9* 7.1* 6.7*  PHOS 3.0 3.4 3.7   Liver Function Tests: Recent Labs  Lab 11/29/18 0538 11/30/18 0705 12/01/18 0809  ALBUMIN 1.8* 2.1* 1.8*   No results for input(s): LIPASE, AMYLASE in the last 168 hours. CBC: Recent Labs  Lab 11/26/18 0236 11/28/18 0152 11/29/18 0538 11/30/18 0705 12/01/18 0917  WBC 10.1 11.7* 11.3* 13.1* 14.8*  NEUTROABS 8.6*  --   --   --   --   HGB 9.7* 9.4* 9.3* 9.8* 8.5*  HCT 33.7* 33.1* 33.0* 34.7* 30.8*  MCV 88.9 88.5 90.4 89.7 89.3  PLT 127* 137* 111* 116* 113*   Blood Culture    Component Value Date/Time   SDES BLOOD LEFT ARM 12/28/2016 0305   SPECREQUEST IN PEDIATRIC BOTTLE Blood Culture adequate volume 12/28/2016 0305   CULT NO GROWTH 5 DAYS 12/28/2016 0305   REPTSTATUS 01/02/2017 FINAL 12/28/2016 0305    Cardiac Enzymes: No results for input(s): CKTOTAL, CKMB, CKMBINDEX, TROPONINI in the last 168 hours. CBG: Recent Labs  Lab 11/30/18 2139 12/01/18 0809 12/01/18 1705 12/01/18 2107 12/02/18 0818  GLUCAP 130* 90 107* 121* 89   Iron  Studies: No results for input(s): IRON, TIBC, TRANSFERRIN, FERRITIN in the last 72 hours. @lablastinr3 @ Studies/Results: No results found. Medications: . ceFEPime (MAXIPIME) IV 2 g (12/01/18 1536)  . vancomycin 1,000 mg (12/01/18 1432)  . vancomycin     . Chlorhexidine Gluconate Cloth  6 each Topical Q0600  . Chlorhexidine Gluconate Cloth  6 each Topical Q0600  . collagenase   Topical Daily  . darbepoetin (ARANESP) injection - DIALYSIS  100 mcg Intravenous Q Sat-HD  . darbepoetin (ARANESP) injection - DIALYSIS  100 mcg Intravenous Once  . feeding supplement (PRO-STAT SUGAR FREE 64)  30 mL Oral BID  . gabapentin  100 mg Oral TID  . heparin  5,000 Units Subcutaneous Q8H  . insulin aspart  0-9 Units Subcutaneous TID WC  . insulin glargine  20 Units Subcutaneous QHS  . levothyroxine  125 mcg Oral Q0600  . midodrine  15 mg Oral TID WC  . sevelamer carbonate  800 mg Oral TID WC     Dialysis Orders: [facility] on [address] TTS  3h 53min 113kg (250 lbs) 2/2.5 bath  R AVF -No Heparin  -Mircera 200 mcg IV q 2 weeks last dose 8/27  Assessment/Plan: 1. Stage IV sacral decubitus ulcer -  Limited mobility. Mostly wheelchair/bedbound. S/P bedside debridement per surgery 9/28. Hydrotherapy, Vanc, cefepime - per primary.Per PMD notes has been at [place] in the past. 2. Abdominal Pain: CT shows small residual collection posterior border of lived concerning for abscess. On Vanc/Cefepime.  3. ESRD -HD TTS. Continue on schedule. Next HD 10/06 on schedule.  4. Hypotension/volume: On midodrine for BP support.Has BLE edema. HD 10/04 Pre wt 122 Net UF only 434 Post wt 121.7. Very much above OP EDW. SBP in 80s during HD SBP in 80s now. Continue Volume removal. Give extra dose midodrine with HD tomorrow.  5. Anemia - Hgb 8.5 Rec'd Aranesp 100 mcg IV 10/04. Follow HGB.  6. Metabolic bone disease -Phos/ C Ca at goal.  No Vit D as outpatient. Continue binders. 7. Nutrition -Albumin  1.9. Prot supp for low albumin  8. DM -insulin per primary  9. S/p cystectomy with urostomy  Roux Brandy H. Silvano Garofano NP-C 12/02/2018, 11:47 AM  [employer] [phone]

## 2018-12-02 NOTE — Progress Notes (Signed)
PROGRESS NOTE    [name]  [id number] DOB: 1955/06/13 DOA: 11/22/2018 PCP: [facility]   Brief Narrative:  HPI on 11/18/2018 by Dr. Harrold Donath is a 63 y.o. male with medical history significant of end-stage renal disease on hemodialysis TTS, peripheral vascular disease status post left AKA, right femoral fracture, diabetes, morbid obesity, hypertension, hyperlipidemia presenting to the ER with ulcer in his sacral.  Patient is largely wheelchair and bedbound.  He also spends at least 4 hours at dialysis 3 times a week therefore putting a lot of pressure on his sacral area.  He developed a small ulcer apparently about 4 weeks ago which has now progressed to stage IV sacral decubitus ulcer.  He came in with a pain as well as discharge from the area.  Patient was seen in the ER and evaluated and help to have what appears to be infected decubitus ulcer.  He has some eschar over it.  Has not used anything prior to now.  Denied any fever or chills no nausea vomiting or diarrhea.  Patient is being admitted for management of his decubitus ulcer which may require debridement and aggressive wound care.   Interim history  Patient admitted for sacral decub ulcer. Currently on vancomycin and cefepime and undergoing hydrotherapy. General surgery consulted. Palliative care consulted for symptom management and GOC.  Assessment & Plan   Sacral decubitus ulcer -Stage IV and infected, noted on admission -patient mostly wheelchair/bedbound -S/p bedside debridement 11/25/2018 by general surgery -Continue vancomycin and cefepime -Continue hydrotherapy -No cultures were obtained -No leukocytosis, patient currently afebrile. However, CRP 21.9 -X-ray of sacrum/coccyx showed diffuse bony demineralization.  Sacral ulceration extends to the level of the bone however evaluation is markedly limited.  -complains of more sacral pain today and does not feel morphine helps- have  added IV dilaudid PRN for severe pain or hydrotherapy  -Palliative care consulted and appreciated   Abdominal pain -Recently hospitalized and had perihepatic abscess requiring drain placement and antibiotics -CT abdomen/pelvis small residual collection along the posterior border of the right liver lobe measuring 1.8 x 3.8 x 2.5 cm in size.  Is concerning for residual abscess or infection.  Persistent periureteral stranding increased from prior exams. Distal sigmoid protrudes into a bowel containing right inguinal hernia, no obstruction. New consolidation LLL.   ESRD on HD -Nephrology consulted and appreciated -Patient dialyzes Tuesday, Thursday, Saturday -last dialysis 10/4  Diabetes mellitus, type II -Continue insulin sliding scale, Lantus, CBG monitoring  Chronic hypertension -Continue midodrine  Hypothyroidism -Continue Synthroid  Hyperlipidemia -Continue statin  Neuropathy -Continue gabapentin  Hypomagnesemia -replaced, continue to monitor   Chronic normocytic anemia -Hemoglobin 8.5, appears to be close to baseline  Obesity -BMI 33.91 -Status post AKA, likely BMI underestimated -Need to follow-up with PCP to discuss lifestyle modifications and interventions  Acute on chronic hypoxia and hypercapnia/ OSA -Patient has been on BiPAP at night at home- Bipap reordered -He has been on oxygen since August 2020- 4L at baseline and follows with pulmonologist at [facility] -Patient was noted to be hypoxic after HD on 10/4, oxygen was increased to 6L and ABG was ordered but unable to obtain. Today, oxygenation has improved, back to baseline of supplemental 4L   Goals of care -Palliative care consulted and appreciated, for symptom management as well as GOC -Palliative care discussed pain control with patient, offered fentanyl, however patient stated that it did not work. Seems that both patient and wife are used to him  being "chronically ill". Continues to be full code  DVT  Prophylaxis  heparin  Code Status: Full  Family Communication: None at bedside  Disposition Plan: Admitted. Pending improvement of wound with hydrotherapy and pain.  Continues to need IV antibiotics. Will look into LTAC as patient needs continued hydrotherapy/wound care and dialysis.   Consultants General surgery Nephrology  Palliative Care  Procedures  Bedside I&D  Antibiotics   Anti-infectives (From admission, onward)   Start     Dose/Rate Route Frequency Ordered Stop   12/01/18 1425  vancomycin (VANCOCIN) 1-5 GM/200ML-% IVPB    Note to Pharmacy: Tawanna Solo   : cabinet override      12/01/18 1425 12/02/18 0229   12/01/18 1200  vancomycin (VANCOCIN) IVPB 1000 mg/200 mL premix     1,000 mg 200 mL/hr over 60 Minutes Intravenous  Once 11/30/18 1553     11/28/18 1003  vancomycin (VANCOCIN) 1-5 GM/200ML-% IVPB    Note to Pharmacy: Tawanna Solo   : cabinet override      11/28/18 1003 11/28/18 2214   11/26/18 1800  ceFEPIme (MAXIPIME) 2 g in sodium chloride 0.9 % 100 mL IVPB     2 g 200 mL/hr over 30 Minutes Intravenous Every T-Th-Sa (1800) 11/25/18 0916     11/26/18 1659  vancomycin (VANCOCIN) 1-5 GM/200ML-% IVPB    Note to Pharmacy: Cherylann Banas   : cabinet override      11/26/18 1659 11/26/18 2037   11/26/18 1200  vancomycin (VANCOCIN) IVPB 1000 mg/200 mL premix     1,000 mg 200 mL/hr over 60 Minutes Intravenous Every T-Th-Sa (Hemodialysis) 11/11/2018 1916     11/25/18 0945  ceFEPIme (MAXIPIME) 2 g in sodium chloride 0.9 % 100 mL IVPB     2 g 200 mL/hr over 30 Minutes Intravenous STAT 11/25/18 0915 11/25/18 1039   11/13/2018 1930  vancomycin (VANCOCIN) 2,500 mg in sodium chloride 0.9 % 500 mL IVPB     2,500 mg 250 mL/hr over 120 Minutes Intravenous  Once 11/13/2018 1847 11/06/2018 2342      Subjective:   [name] seen and examined today.  Feels pain is controlled and okay at this time.  Feels his breathing has also improved.  Has no complaints this morning.  Denies current  chest pain, shortness of breath, abdominal pain, nausea or vomiting, diarrhea constipation. Objective:   Vitals:   12/01/18 1600 12/01/18 1624 12/01/18 1711 12/02/18 0437  BP: (!) 83/48 (!) 88/59 (!) 87/53 (!) 87/60  Pulse: 64 80 89 70  Resp:  18 18   Temp:  98 F (36.7 C) 98 F (36.7 C) 98.9 F (37.2 C)  TempSrc:  Oral Oral   SpO2:  100% 97% (!) 87%  Weight:  121.7 kg    Height:        Intake/Output Summary (Last 24 hours) at 12/02/2018 0910 Last data filed at 12/02/2018 0900 Gross per 24 hour  Intake 360 ml  Output 484 ml  Net -124 ml   Filed Weights   11/29/18 0404 12/01/18 1210 12/01/18 1624  Weight: 121.5 kg 122 kg 121.7 kg   Exam  General: Well developed, chronically ill appears, NAD  HEENT: NCAT, mucous membranes moist.   Cardiovascular: S1 S2 auscultated, RRR  Respiratory: Clear to auscultation bilaterally anteriorly  Abdomen: Soft, obese, nontender, nondistended, + bowel sounds, +ostomy  Extremities: warm dry without cyanosis clubbing or edema of RLE. L AKA  Neuro: AAOx3, nonfocal  Psych: Appropriate mood and affect  Data Reviewed:  I have personally reviewed following labs and imaging studies  CBC: Recent Labs  Lab 11/26/18 0236 11/28/18 0152 11/29/18 0538 11/30/18 0705 12/01/18 0917  WBC 10.1 11.7* 11.3* 13.1* 14.8*  NEUTROABS 8.6*  --   --   --   --   HGB 9.7* 9.4* 9.3* 9.8* 8.5*  HCT 33.7* 33.1* 33.0* 34.7* 30.8*  MCV 88.9 88.5 90.4 89.7 89.3  PLT 127* 137* 111* 116* 123456*   Basic Metabolic Panel: Recent Labs  Lab 11/26/18 0236 11/28/18 0152 11/29/18 0538 11/30/18 0705 12/01/18 0809 12/02/18 0441  NA 132* 130* 131* 130* 128*  --   K 5.1 5.1 4.6 5.5* 6.1*  --   CL 95* 93* 95* 94* 94*  --   CO2 22 23 23 25  21*  --   GLUCOSE 141* 147* 132* 113* 89  --   BUN 49* 48* 34* 51* 69*  --   CREATININE 4.02* 3.59* 2.69* 3.27* 3.93*  --   CALCIUM 6.7* 6.9* 6.9* 7.1* 6.7*  --   MG 1.5* 1.9 1.8  --   --  1.8  PHOS 3.2  --  3.0 3.4 3.7  --     GFR: Estimated Creatinine Clearance: 23.9 mL/min (A) (by C-G formula based on SCr of 3.93 mg/dL (H)). Liver Function Tests: Recent Labs  Lab 11/26/18 0236 11/29/18 0538 11/30/18 0705 12/01/18 0809  ALBUMIN 1.8* 1.8* 2.1* 1.8*   No results for input(s): LIPASE, AMYLASE in the last 168 hours. No results for input(s): AMMONIA in the last 168 hours. Coagulation Profile: No results for input(s): INR, PROTIME in the last 168 hours. Cardiac Enzymes: No results for input(s): CKTOTAL, CKMB, CKMBINDEX, TROPONINI in the last 168 hours. BNP (last 3 results) No results for input(s): PROBNP in the last 8760 hours. HbA1C: No results for input(s): HGBA1C in the last 72 hours. CBG: Recent Labs  Lab 11/30/18 2139 12/01/18 0809 12/01/18 1705 12/01/18 2107 12/02/18 0818  GLUCAP 130* 90 107* 121* 89   Lipid Profile: No results for input(s): CHOL, HDL, LDLCALC, TRIG, CHOLHDL, LDLDIRECT in the last 72 hours. Thyroid Function Tests: No results for input(s): TSH, T4TOTAL, FREET4, T3FREE, THYROIDAB in the last 72 hours. Anemia Panel: No results for input(s): VITAMINB12, FOLATE, FERRITIN, TIBC, IRON, RETICCTPCT in the last 72 hours. Urine analysis:    Component Value Date/Time   COLORURINE AMBER (A) 12/27/2016 1601   APPEARANCEUR TURBID (A) 12/27/2016 1601   LABSPEC >1.046 (H) 12/27/2016 1601   PHURINE 5.0 12/27/2016 1601   GLUCOSEU NEGATIVE 12/27/2016 1601   HGBUR NEGATIVE 12/27/2016 1601   BILIRUBINUR NEGATIVE 12/27/2016 1601   BILIRUBINUR neg 08/10/2014 1637   KETONESUR NEGATIVE 12/27/2016 1601   PROTEINUR NEGATIVE 12/27/2016 1601   UROBILINOGEN 1.0 08/10/2014 1637   UROBILINOGEN 0.2 08/03/2014 1225   NITRITE NEGATIVE 12/27/2016 1601   LEUKOCYTESUR TRACE (A) 12/27/2016 1601   Sepsis Labs: @LABRCNTIP (procalcitonin:4,lacticidven:4)  ) Recent Results (from the past 240 hour(s))  SARS CORONAVIRUS 2 (TAT 6-24 HRS) Nasopharyngeal Nasopharyngeal Swab     Status: None   Collection  Time: 10/29/2018  3:10 PM   Specimen: Nasopharyngeal Swab  Result Value Ref Range Status   SARS Coronavirus 2 NEGATIVE NEGATIVE Final    Comment: (NOTE) SARS-CoV-2 target nucleic acids are NOT DETECTED. The SARS-CoV-2 RNA is generally detectable in upper and lower respiratory specimens during the acute phase of infection. Negative results do not preclude SARS-CoV-2 infection, do not rule out co-infections with other pathogens, and should not be used as the sole basis  for treatment or other patient management decisions. Negative results must be combined with clinical observations, patient history, and epidemiological information. The expected result is Negative. Fact Sheet for Patients: SugarRoll.be Fact Sheet for Healthcare Providers: https://www.woods-mathews.com/ This test is not yet approved or cleared by the [place] FDA and  has been authorized for detection and/or diagnosis of SARS-CoV-2 by FDA under an Emergency Use Authorization (EUA). This EUA will remain  in effect (meaning this test can be used) for the duration of the COVID-19 declaration under Section 56 4(b)(1) of the Act, 21 U.S.C. section 360bbb-3(b)(1), unless the authorization is terminated or revoked sooner. Performed at [facility], [place] [address] [place], [place] [address]       Radiology Studies: No results found.   Scheduled Meds: . Chlorhexidine Gluconate Cloth  6 each Topical Q0600  . Chlorhexidine Gluconate Cloth  6 each Topical Q0600  . collagenase   Topical Daily  . darbepoetin (ARANESP) injection - DIALYSIS  100 mcg Intravenous Q Sat-HD  . darbepoetin (ARANESP) injection - DIALYSIS  100 mcg Intravenous Once  . feeding supplement (PRO-STAT SUGAR FREE 64)  30 mL Oral BID  . gabapentin  100 mg Oral TID  . heparin  5,000 Units Subcutaneous Q8H  . insulin aspart  0-9 Units Subcutaneous TID WC  . insulin glargine  20 Units Subcutaneous QHS  .  levothyroxine  125 mcg Oral Q0600  . midodrine  15 mg Oral TID WC  . sevelamer carbonate  800 mg Oral TID WC   Continuous Infusions: . ceFEPime (MAXIPIME) IV 2 g (12/01/18 1536)  . vancomycin 1,000 mg (12/01/18 1432)  . vancomycin       LOS: 8 days   Time Spent in minutes   30 minutes  Anurag Scarfo D.O. on 12/02/2018 at 9:10 AM  Between 7am to 7pm - Please see pager noted on [email or web address]  After 7pm go to [email or web address]  And look for the night coverage person covering for me after hours  [facility] Office  [phone]

## 2018-12-02 NOTE — Progress Notes (Signed)
Physical Therapy Wound Treatment Patient Details  Name: CRESTON KLAS MRN: [id number] Date of Birth: 1955/07/27  Today's Date: 12/02/2018 Time: 1110-1159 Time Calculation (min): 49 min  Subjective  Subjective: Pt remains anxious Patient and Family Stated Goals: None stated.  Date of Onset: (Unknown) Prior Treatments: Bedside debridement 9/28  Pain Score:   Premedicated with IV pain meds. Reports pain throughout (stomach, breathing, arms, legs), but appeared to tolerate the actual hydrotherapy treatment session well with mild sacral pain.  Wound Assessment  Wound / Incision (Open or Dehisced) 11/25/18 Other (Comment) Sacrum Mid (Active)  Wound Image   12/02/18 1245  Dressing Type ABD;Moist to dry;Barrier Film (skin prep);Gauze (Comment) 12/02/18 1245  Dressing Changed Changed 12/02/18 1245  Dressing Status Clean;Dry;Intact 12/02/18 1245  Dressing Change Frequency Daily 12/02/18 1245  Site / Wound Assessment Red;Yellow;Black 12/02/18 1245  % Wound base Red or Granulating 25% 12/02/18 1245  % Wound base Yellow/Fibrinous Exudate 50% 12/02/18 1245  % Wound base Black/Eschar 25% 12/02/18 1245  % Wound base Other/Granulation Tissue (Comment) 0% 12/02/18 1245  Peri-wound Assessment Intact;Pink 12/02/18 1245  Wound Length (cm) 15 cm 11/26/18 1000  Wound Width (cm) 10 cm 11/26/18 1000  Wound Depth (cm) 3 cm 11/26/18 1000  Wound Volume (cm^3) 450 cm^3 11/26/18 1000  Wound Surface Area (cm^2) 150 cm^2 11/26/18 1000  Undermining (cm) 2.8 cm from about 6:00-12:00 11/26/18 1000  Margins Unattached edges (unapproximated) 12/02/18 1245  Closure None 12/02/18 1245  Drainage Amount Moderate 12/02/18 1245  Drainage Description Purulent;Odor;Green 12/02/18 1245  Treatment Debridement (Selective);Hydrotherapy (Pulse lavage);Packing (Saline gauze) 12/02/18 1245   Santyl applied to wound bed prior to applying dressing.    Hydrotherapy Pulsed lavage therapy - wound location: Sacrum Pulsed Lavage  with Suction (psi): 12 psi Pulsed Lavage with Suction - Normal Saline Used: 1000 mL Pulsed Lavage Tip: Tip with splash shield Selective Debridement Selective Debridement - Location: Sacrum Selective Debridement - Tools Used: Forceps;Scalpel;Scissors Selective Debridement - Tissue Removed: black necrotic tissue   Wound Assessment and Plan  Wound Therapy - Assess/Plan/Recommendations Wound Therapy - Clinical Statement: Progressing with debridement. Noted dressing was soaked with blue/green drainage this session and discussed addition of Dakin's for next session with surgical PA, Janett Billow. Will continue to follow for selective removal of unviable tissue, to decrease bioburden and promote wound bed healing.  Wound Therapy - Functional Problem List: Decreased mobility, AKA, global weakness.  Factors Delaying/Impairing Wound Healing: Diabetes Mellitus;Immobility;Multiple medical problems Hydrotherapy Plan: Debridement;Dressing change;Patient/family education;Pulsatile lavage with suction Wound Therapy - Frequency: 6X / week Wound Therapy - Follow Up Recommendations: Skilled nursing facility Wound Plan: See above  Wound Therapy Goals- Improve the function of patient's integumentary system by progressing the wound(s) through the phases of wound healing (inflammation - proliferation - remodeling) by: Decrease Necrotic Tissue to: 20% Decrease Necrotic Tissue - Progress: Progressing toward goal Increase Granulation Tissue to: 80% Increase Granulation Tissue - Progress: Progressing toward goal Goals/treatment plan/discharge plan were made with and agreed upon by patient/family: Yes Time For Goal Achievement: 7 days Wound Therapy - Potential for Goals: Fair  Goals will be updated until maximal potential achieved or discharge criteria met.  Discharge criteria: when goals achieved, discharge from hospital, MD decision/surgical intervention, no progress towards goals, refusal/missing three consecutive  treatments without notification or medical reason.  GP     Thelma Comp 12/02/2018, 1:02 PM   Rolinda Roan, PT, DPT Acute Rehabilitation Services Pager: [phone] Office: [phone]

## 2018-12-03 ENCOUNTER — Inpatient Hospital Stay (HOSPITAL_COMMUNITY): Payer: Medicare Other

## 2018-12-03 LAB — CBC
HCT: 30.5 % — ABNORMAL LOW (ref 39.0–52.0)
Hemoglobin: 8.4 g/dL — ABNORMAL LOW (ref 13.0–17.0)
MCH: 24.5 pg — ABNORMAL LOW (ref 26.0–34.0)
MCHC: 27.5 g/dL — ABNORMAL LOW (ref 30.0–36.0)
MCV: 88.9 fL (ref 80.0–100.0)
Platelets: 149 10*3/uL — ABNORMAL LOW (ref 150–400)
RBC: 3.43 MIL/uL — ABNORMAL LOW (ref 4.22–5.81)
RDW: 17.6 % — ABNORMAL HIGH (ref 11.5–15.5)
WBC: 19.8 10*3/uL — ABNORMAL HIGH (ref 4.0–10.5)
nRBC: 0.6 % — ABNORMAL HIGH (ref 0.0–0.2)

## 2018-12-03 LAB — RENAL FUNCTION PANEL
Albumin: 2 g/dL — ABNORMAL LOW (ref 3.5–5.0)
Anion gap: 13 (ref 5–15)
BUN: 48 mg/dL — ABNORMAL HIGH (ref 8–23)
CO2: 24 mmol/L (ref 22–32)
Calcium: 7 mg/dL — ABNORMAL LOW (ref 8.9–10.3)
Chloride: 91 mmol/L — ABNORMAL LOW (ref 98–111)
Creatinine, Ser: 3.21 mg/dL — ABNORMAL HIGH (ref 0.61–1.24)
GFR calc Af Amer: 23 mL/min — ABNORMAL LOW (ref >60)
GFR calc non Af Amer: 20 mL/min — ABNORMAL LOW (ref >60)
Glucose, Bld: 149 mg/dL — ABNORMAL HIGH (ref 70–99)
Phosphorus: 3 mg/dL (ref 2.5–4.6)
Potassium: 5 mmol/L (ref 3.5–5.1)
Sodium: 128 mmol/L — ABNORMAL LOW (ref 135–145)

## 2018-12-03 LAB — BLOOD GAS, ARTERIAL
Acid-Base Excess: 3.7 mmol/L — ABNORMAL HIGH (ref 0.0–2.0)
Bicarbonate: 28.8 mmol/L — ABNORMAL HIGH (ref 20.0–28.0)
Delivery systems: POSITIVE
FIO2: 0.36
O2 Saturation: 90 %
Patient temperature: 98.6
pCO2 arterial: 52.4 mmHg — ABNORMAL HIGH (ref 32.0–48.0)
pH, Arterial: 7.359 (ref 7.350–7.450)
pO2, Arterial: 60 mmHg — ABNORMAL LOW (ref 83.0–108.0)

## 2018-12-03 LAB — GLUCOSE, CAPILLARY
Glucose-Capillary: 130 mg/dL — ABNORMAL HIGH (ref 70–99)
Glucose-Capillary: 162 mg/dL — ABNORMAL HIGH (ref 70–99)

## 2018-12-03 MED ORDER — SODIUM CHLORIDE 0.9 % IV SOLN: 100.0000 mL | INTRAVENOUS | Status: DC | PRN

## 2018-12-03 MED ORDER — LIDOCAINE HCL (PF) 1 % IJ SOLN: 5.0000 mL | INTRAMUSCULAR | Status: DC | PRN

## 2018-12-03 MED ORDER — VANCOMYCIN HCL IN DEXTROSE 1-5 GM/200ML-% IV SOLN
INTRAVENOUS | Status: AC
  Filled 2018-12-03: qty 200

## 2018-12-03 MED ORDER — ALBUMIN HUMAN 25 % IV SOLN
INTRAVENOUS | Status: AC
  Administered 2018-12-03: 12.5 g
  Filled 2018-12-03: qty 100

## 2018-12-03 MED ORDER — PENTAFLUOROPROP-TETRAFLUOROETH EX AERO: 1.0000 "application " | INHALATION_SPRAY | CUTANEOUS | Status: DC | PRN

## 2018-12-03 MED ORDER — MIDODRINE HCL 5 MG PO TABS
ORAL_TABLET | ORAL | Status: AC
  Filled 2018-12-03: qty 3

## 2018-12-03 MED ORDER — LIDOCAINE-PRILOCAINE 2.5-2.5 % EX CREA: 1.0000 "application " | TOPICAL_CREAM | CUTANEOUS | Status: DC | PRN

## 2018-12-03 NOTE — Progress Notes (Signed)
Pt adamantly refusing wound care.

## 2018-12-03 NOTE — Progress Notes (Signed)
PT Cancellation Note  Patient Details Name: [name] MRN: [id number] DOB: 22-Mar-1955   Cancelled Treatment:    Reason Eval/Treat Not Completed: Medical issues which prohibited therapy. Discussed pt case with RN who states pt is now requiring CPAP due to declined respiratory status. Will hold hydrotherapy this date and check back tomorrow for medical readiness to participate in treatment.   Thelma Comp 12/03/2018, 1:12 PM   Rolinda Roan, PT, DPT Acute Rehabilitation Services Pager: [phone] Office: [phone]

## 2018-12-03 NOTE — Progress Notes (Signed)
Patient requiring CPAP, refusing dressing change today.

## 2018-12-03 NOTE — Progress Notes (Signed)
Rapid Response Event Note  Overview: Called by RN for pt with c/o SOB and spO2 76% on CPAP. Pt has been c/o of this since HD earlier. Abg obtained earlier today (7.35/52/60/28). Instructed RN to call provider along with RT if pt desating and I would come as soon as I could.  On arrival to room, pt sleeping comfortably on CPAP with spO2 96%.   Md had already placed an order for Bipap and transfer to PCU. Unsure if anxiety may be playing a role.   Plan of care: Continue to monitor respiratory status. Keep pt comfortable. RN instructed to call with any changes or concerns.   Called at: 1528 Event ended at: 1600

## 2018-12-03 NOTE — Procedures (Signed)
Patient seen and examined on Hemodialysis. BP (!) 75/47   Pulse 61   Temp (!) 97.4 F (36.3 C) (Oral)   Resp 15   Ht 5\' 5"  (1.651 m)   Wt 124.2 kg   SpO2 97%   BMI 45.56 kg/m   QB 400 mL/ min via AVF, UF goal 2L  Lowering machine temp to hopefully help augment BP.  Has already received albumin.  Tolerating treatment without complaints at this time.   Madelon Lips MD [facility] pgr [phone] 9:25 AM

## 2018-12-03 NOTE — Progress Notes (Signed)
Pt placed on auto-titrate BiPAP per order through [employer].  o2 is being titrated between 4 and 6 l/m as needed.  Will continue to monitor patient's progress.

## 2018-12-03 NOTE — Progress Notes (Signed)
Called to patient's room by RN because pt is c/o dyspnea.  On assessment, pt appears to be resting comfortably with o2 saturaton 99% on 6 l/m [place].  Pt's breath sounds are essentially clear with diminished BS in the bases bilaterally.  Pt requested to be placed on his CPAP.  Pt placed on CPAP with o2 at 4 l/m bled into the circuit.  Pt continues to appear to be resting comfortably at this time.  An ABG has been ordered which will be obtained in a few minutes.

## 2018-12-03 NOTE — Progress Notes (Addendum)
Patient continues to call out stating he cannot breathe despite being on CPAP with 6L O2. MD notified and placed transfer order to progressive care to be placed on Bipap.   1754 Patient transferred to 2W. Report called to [place], RN  Attempted to call wife x2. Did not leave voicemail.

## 2018-12-03 NOTE — Progress Notes (Signed)
[facility] Progress Note   Subjective: Seen on dialysis- feeling poorly.    Objective Vitals:   12/03/18 0731 12/03/18 0800 12/03/18 0830 12/03/18 0900  BP: (!) 86/44 (!) 82/51 (!) 83/35 (!) 87/52  Pulse: 81 87 66 61  Resp:      Temp:      TempSrc:      SpO2:      Weight:      Height:       Physical Exam General: Obese, chronically ill appearing male in NAD Heart: S1,S2 RRR Lungs: CTAB A/P Abdomen: Active BS, obese, NT Extremities: L AKA with 1+ stump edema. LLE 1+ edema. Dialysis Access: R AVF + bruit, accessed, aneurysmal    Additional Objective Labs: Basic Metabolic Panel: Recent Labs  Lab 12/01/18 0809 12/02/18 1106 12/03/18 0423  NA 128* 130* 128*  K 6.1* 4.9 5.0  CL 94* 95* 91*  CO2 21* 23 24  GLUCOSE 89 95 149*  BUN 69* 39* 48*  CREATININE 3.93* 2.73* 3.21*  CALCIUM 6.7* 6.8* 7.0*  PHOS 3.7 2.9 3.0   Liver Function Tests: Recent Labs  Lab 12/01/18 0809 12/02/18 1106 12/03/18 0423  ALBUMIN 1.8* 2.0* 2.0*   No results for input(s): LIPASE, AMYLASE in the last 168 hours. CBC: Recent Labs  Lab 11/28/18 0152 11/29/18 0538 11/30/18 0705 12/01/18 0917 12/03/18 0423  WBC 11.7* 11.3* 13.1* 14.8* 19.8*  HGB 9.4* 9.3* 9.8* 8.5* 8.4*  HCT 33.1* 33.0* 34.7* 30.8* 30.5*  MCV 88.5 90.4 89.7 89.3 88.9  PLT 137* 111* 116* 113* 149*   Blood Culture    Component Value Date/Time   SDES BLOOD LEFT ARM 12/28/2016 0305   SPECREQUEST IN PEDIATRIC BOTTLE Blood Culture adequate volume 12/28/2016 0305   CULT NO GROWTH 5 DAYS 12/28/2016 0305   REPTSTATUS 01/02/2017 FINAL 12/28/2016 0305    Cardiac Enzymes: No results for input(s): CKTOTAL, CKMB, CKMBINDEX, TROPONINI in the last 168 hours. CBG: Recent Labs  Lab 12/01/18 2107 12/02/18 0818 12/02/18 1213 12/02/18 1709 12/02/18 1940  GLUCAP 121* 89 117* 97 122*   Iron Studies: No results for input(s): IRON, TIBC, TRANSFERRIN, FERRITIN in the last 72  hours. @lablastinr3 @ Studies/Results: No results found. Medications: . sodium chloride    . sodium chloride    . ceFEPime (MAXIPIME) IV 2 g (12/01/18 1536)  . vancomycin 1,000 mg (12/01/18 1432)  . vancomycin     . Chlorhexidine Gluconate Cloth  6 each Topical Q0600  . Chlorhexidine Gluconate Cloth  6 each Topical Q0600  . collagenase   Topical Daily  . darbepoetin (ARANESP) injection - DIALYSIS  100 mcg Intravenous Q Sat-HD  . feeding supplement (PRO-STAT SUGAR FREE 64)  30 mL Oral BID  . gabapentin  100 mg Oral TID  . heparin  5,000 Units Subcutaneous Q8H  . insulin aspart  0-9 Units Subcutaneous TID WC  . insulin glargine  20 Units Subcutaneous QHS  . levothyroxine  125 mcg Oral Q0600  . midodrine      . midodrine  15 mg Oral TID WC  . sevelamer carbonate  800 mg Oral TID WC     Dialysis Orders: [place] [facility] [address] TTS  3h 24min 113kg (250 lbs) 2/2.5 bath  R AVF -No Heparin  -Mircera 200 mcg IV q 2 weeks last dose 8/27  Assessment/Plan: 1. Stage IV sacral decubitus ulcer - Limited mobility. Mostly wheelchair/bedbound. S/P bedside debridement per surgery 9/28. Hydrotherapy, Vanc, cefepime - per primary.Per PMD notes has been at [facility] in  the past. 2. Abdominal Pain: CT shows small residual collection posterion schedule. r border of lived concerning for abscess. On Vanc/Cefepime.  3. ESRD -HD TTS. Continue on schedule. Next HD 10/06.  Low temp, got albumin.  Na 128 which notes vol overload, will do extra rx tomorrow. 4. Hypotension/volume: On midodrine for BP support.Has BLE edema. HD 10/04 Pre wt 122 Net UF only 434 Post wt 121.7. Very much above OP EDW. SBP in 80s during HD. Continue Volume removal as tolerated with SBP > 85 5. Anemia - Hgb 8.5 Rec'd Aranesp 100 mcg IV 10/04. Follow HGB.  6. Metabolic bone disease -Phos/ C Ca at goal.  No Vit D as outpatient. Continue binders. 7. Nutrition -Albumin 1.9. Prot supp for low albumin  8. DM  -insulin per primary  9. S/p cystectomy with urostomy 10. 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

## 2018-12-03 NOTE — Progress Notes (Signed)
PROGRESS NOTE    [name]  [id number] DOB: 08-29-1955 DOA: 11/20/2018 PCP: [place]   Brief Narrative:  HPI on 11/13/2018 by Dr. Harrold Donath is a 63 y.o. male with medical history significant of end-stage renal disease on hemodialysis TTS, peripheral vascular disease status post left AKA, right femoral fracture, diabetes, morbid obesity, hypertension, hyperlipidemia presenting to the ER with ulcer in his sacral.  Patient is largely wheelchair and bedbound.  He also spends at least 4 hours at dialysis 3 times a week therefore putting a lot of pressure on his sacral area.  He developed a small ulcer apparently about 4 weeks ago which has now progressed to stage IV sacral decubitus ulcer.  He came in with a pain as well as discharge from the area.  Patient was seen in the ER and evaluated and help to have what appears to be infected decubitus ulcer.  He has some eschar over it.  Has not used anything prior to now.  Denied any fever or chills no nausea vomiting or diarrhea.  Patient is being admitted for management of his decubitus ulcer which may require debridement and aggressive wound care.   Interim history  Patient admitted for sacral decub ulcer. Currently on vancomycin and cefepime and undergoing hydrotherapy. General surgery consulted. Palliative care consulted for symptom management and GOC.  Assessment & Plan   Sacral decubitus ulcer -Stage IV and infected, noted on admission -patient mostly wheelchair/bedbound -S/p bedside debridement 11/25/2018 by general surgery -Continue vancomycin and cefepime -Continue hydrotherapy -No cultures were obtained -No leukocytosis, patient currently afebrile. However, CRP 21.9 -X-ray of sacrum/coccyx showed diffuse bony demineralization.  Sacral ulceration extends to the level of the bone however evaluation is markedly limited.  -complains of more sacral pain today and does not feel morphine helps- have  added IV dilaudid PRN for severe pain or hydrotherapy  -Palliative care consulted and appreciated  -Case management consulted for LTAC placement  Abdominal pain -No further complaints of abdominal pain -Recently hospitalized and had perihepatic abscess requiring drain placement and antibiotics -CT abdomen/pelvis small residual collection along the posterior border of the right liver lobe measuring 1.8 x 3.8 x 2.5 cm in size.  Is concerning for residual abscess or infection.  Persistent periureteral stranding increased from prior exams. Distal sigmoid protrudes into a bowel containing right inguinal hernia, no obstruction. New consolidation LLL.   ESRD on HD -Nephrology consulted and appreciated -Patient dialyzes Tuesday, Thursday, Saturday -Currently in diaslysis  Diabetes mellitus, type II -Continue insulin sliding scale, Lantus, CBG monitoring  Chronic hypertension -Continue midodrine  Hypothyroidism -Continue Synthroid  Hyperlipidemia -Continue statin  Neuropathy -Continue gabapentin  Hypomagnesemia -replaced, continue to monitor   Chronic normocytic anemia -Hemoglobin 8.4, appears to be close to baseline  Obesity -BMI 33.91 -Status post AKA, likely BMI underestimated -Need to follow-up with PCP to discuss lifestyle modifications and interventions  Acute on chronic hypoxia and hypercapnia/ OSA -Patient has been on BiPAP at night at home- Bipap reordered -He has been on oxygen since August 2020- 4L at baseline and follows with pulmonologist at [facility] -Patient was noted to be hypoxic after HD on 10/4, oxygen was increased to 6L and ABG was ordered but unable to obtain.  -oxygenation has been stable, currently on 4L  Goals of care -Palliative care consulted and appreciated, for symptom management as well as GOC -Palliative care discussed pain control with patient, offered fentanyl, however patient stated that it did not work. Seems  that both patient and wife are  used to him being "chronically ill". Continues to be full code  DVT Prophylaxis  heparin  Code Status: Full  Family Communication: None at bedside  Disposition Plan: Admitted. Pending improvement of wound with hydrotherapy and pain.  Continues to need IV antibiotics and IV pain control. Will look into LTAC as patient needs continued hydrotherapy/wound care and dialysis.   Consultants General surgery Nephrology  Palliative Care  Procedures  Bedside I&D  Antibiotics   Anti-infectives (From admission, onward)   Start     Dose/Rate Route Frequency Ordered Stop   12/01/18 1425  vancomycin (VANCOCIN) 1-5 GM/200ML-% IVPB    Note to Pharmacy: Tawanna Solo   : cabinet override      12/01/18 1425 12/02/18 0229   12/01/18 1200  vancomycin (VANCOCIN) IVPB 1000 mg/200 mL premix     1,000 mg 200 mL/hr over 60 Minutes Intravenous  Once 11/30/18 1553     11/28/18 1003  vancomycin (VANCOCIN) 1-5 GM/200ML-% IVPB    Note to Pharmacy: Tawanna Solo   : cabinet override      11/28/18 1003 11/28/18 2214   11/26/18 1800  ceFEPIme (MAXIPIME) 2 g in sodium chloride 0.9 % 100 mL IVPB     2 g 200 mL/hr over 30 Minutes Intravenous Every T-Th-Sa (1800) 11/25/18 0916     11/26/18 1659  vancomycin (VANCOCIN) 1-5 GM/200ML-% IVPB    Note to Pharmacy: Cherylann Banas   : cabinet override      11/26/18 1659 11/26/18 2037   11/26/18 1200  vancomycin (VANCOCIN) IVPB 1000 mg/200 mL premix     1,000 mg 200 mL/hr over 60 Minutes Intravenous Every T-Th-Sa (Hemodialysis) 11/16/2018 1916     11/25/18 0945  ceFEPIme (MAXIPIME) 2 g in sodium chloride 0.9 % 100 mL IVPB     2 g 200 mL/hr over 30 Minutes Intravenous STAT 11/25/18 0915 11/25/18 1039   11/02/2018 1930  vancomycin (VANCOCIN) 2,500 mg in sodium chloride 0.9 % 500 mL IVPB     2,500 mg 250 mL/hr over 120 Minutes Intravenous  Once 11/03/2018 1847 11/17/2018 2342      Subjective:   [name] seen and examined today.  Seen in dialysis.  Feels pain is controlled  today.  Has no complaints of chest pain or shortness of breath this morning no complaints of abdominal pain, nausea or vomiting, diarrhea or constipation. Objective:   Vitals:   12/03/18 0722 12/03/18 0731 12/03/18 0800 12/03/18 0830  BP: 95/60 (!) 86/44 (!) 82/51 (!) 83/35  Pulse: 82 81 87 66  Resp:      Temp: (!) 97.4 F (36.3 C)     TempSrc: Oral     SpO2: 97%     Weight:      Height:        Intake/Output Summary (Last 24 hours) at 12/03/2018 0852 Last data filed at 12/02/2018 1714 Gross per 24 hour  Intake 600 ml  Output 25 ml  Net 575 ml   Filed Weights   11/29/18 0404 12/01/18 1210 12/01/18 1624  Weight: 121.5 kg 122 kg 121.7 kg   Exam  General: Well developed, chronically ill-appearing, NAD  HEENT: NCAT, mucous membranes moist.   Cardiovascular: S1 S2 auscultated, RRR  Respiratory: Clear to auscultation bilaterally, anteriorly  Abdomen: Soft, obese, nontender, nondistended, + bowel sounds, + ostomy  Extremities: Left AKA, with mild edema.  Mild RLE edema.  Neuro: AAOx3, nonfocal  Psych: Appropriate mood and affect  Data Reviewed: I  have personally reviewed following labs and imaging studies  CBC: Recent Labs  Lab 11/28/18 0152 11/29/18 0538 11/30/18 0705 12/01/18 0917 12/03/18 0423  WBC 11.7* 11.3* 13.1* 14.8* 19.8*  HGB 9.4* 9.3* 9.8* 8.5* 8.4*  HCT 33.1* 33.0* 34.7* 30.8* 30.5*  MCV 88.5 90.4 89.7 89.3 88.9  PLT 137* 111* 116* 113* 123456*   Basic Metabolic Panel: Recent Labs  Lab 11/28/18 0152 11/29/18 0538 11/30/18 0705 12/01/18 0809 12/02/18 0441 12/02/18 1106 12/03/18 0423  NA 130* 131* 130* 128*  --  130* 128*  K 5.1 4.6 5.5* 6.1*  --  4.9 5.0  CL 93* 95* 94* 94*  --  95* 91*  CO2 23 23 25  21*  --  23 24  GLUCOSE 147* 132* 113* 89  --  95 149*  BUN 48* 34* 51* 69*  --  39* 48*  CREATININE 3.59* 2.69* 3.27* 3.93*  --  2.73* 3.21*  CALCIUM 6.9* 6.9* 7.1* 6.7*  --  6.8* 7.0*  MG 1.9 1.8  --   --  1.8  --   --   PHOS  --  3.0 3.4  3.7  --  2.9 3.0   GFR: Estimated Creatinine Clearance: 29.3 mL/min (A) (by C-G formula based on SCr of 3.21 mg/dL (H)). Liver Function Tests: Recent Labs  Lab 11/29/18 0538 11/30/18 0705 12/01/18 0809 12/02/18 1106 12/03/18 0423  ALBUMIN 1.8* 2.1* 1.8* 2.0* 2.0*   No results for input(s): LIPASE, AMYLASE in the last 168 hours. No results for input(s): AMMONIA in the last 168 hours. Coagulation Profile: No results for input(s): INR, PROTIME in the last 168 hours. Cardiac Enzymes: No results for input(s): CKTOTAL, CKMB, CKMBINDEX, TROPONINI in the last 168 hours. BNP (last 3 results) No results for input(s): PROBNP in the last 8760 hours. HbA1C: No results for input(s): HGBA1C in the last 72 hours. CBG: Recent Labs  Lab 12/01/18 2107 12/02/18 0818 12/02/18 1213 12/02/18 1709 12/02/18 1940  GLUCAP 121* 89 117* 97 122*   Lipid Profile: No results for input(s): CHOL, HDL, LDLCALC, TRIG, CHOLHDL, LDLDIRECT in the last 72 hours. Thyroid Function Tests: No results for input(s): TSH, T4TOTAL, FREET4, T3FREE, THYROIDAB in the last 72 hours. Anemia Panel: No results for input(s): VITAMINB12, FOLATE, FERRITIN, TIBC, IRON, RETICCTPCT in the last 72 hours. Urine analysis:    Component Value Date/Time   COLORURINE AMBER (A) 12/27/2016 1601   APPEARANCEUR TURBID (A) 12/27/2016 1601   LABSPEC >1.046 (H) 12/27/2016 1601   PHURINE 5.0 12/27/2016 1601   GLUCOSEU NEGATIVE 12/27/2016 1601   HGBUR NEGATIVE 12/27/2016 1601   BILIRUBINUR NEGATIVE 12/27/2016 1601   BILIRUBINUR neg 08/10/2014 1637   KETONESUR NEGATIVE 12/27/2016 1601   PROTEINUR NEGATIVE 12/27/2016 1601   UROBILINOGEN 1.0 08/10/2014 1637   UROBILINOGEN 0.2 08/03/2014 1225   NITRITE NEGATIVE 12/27/2016 1601   LEUKOCYTESUR TRACE (A) 12/27/2016 1601   Sepsis Labs: @LABRCNTIP (procalcitonin:4,lacticidven:4)  ) Recent Results (from the past 240 hour(s))  SARS CORONAVIRUS 2 (TAT 6-24 HRS) Nasopharyngeal Nasopharyngeal  Swab     Status: None   Collection Time: 11/27/2018  3:10 PM   Specimen: Nasopharyngeal Swab  Result Value Ref Range Status   SARS Coronavirus 2 NEGATIVE NEGATIVE Final    Comment: (NOTE) SARS-CoV-2 target nucleic acids are NOT DETECTED. The SARS-CoV-2 RNA is generally detectable in upper and lower respiratory specimens during the acute phase of infection. Negative results do not preclude SARS-CoV-2 infection, do not rule out co-infections with other pathogens, and should not be used as  the sole basis for treatment or other patient management decisions. Negative results must be combined with clinical observations, patient history, and epidemiological information. The expected result is Negative. Fact Sheet for Patients: SugarRoll.be Fact Sheet for Healthcare Providers: https://www.woods-mathews.com/ This test is not yet approved or cleared by the [place] FDA and  has been authorized for detection and/or diagnosis of SARS-CoV-2 by FDA under an Emergency Use Authorization (EUA). This EUA will remain  in effect (meaning this test can be used) for the duration of the COVID-19 declaration under Section 56 4(b)(1) of the Act, 21 U.S.C. section 360bbb-3(b)(1), unless the authorization is terminated or revoked sooner. Performed at [facility], [place] [address], [place], [place] [address]       Radiology Studies: No results found.   Scheduled Meds: . Chlorhexidine Gluconate Cloth  6 each Topical Q0600  . Chlorhexidine Gluconate Cloth  6 each Topical Q0600  . collagenase   Topical Daily  . darbepoetin (ARANESP) injection - DIALYSIS  100 mcg Intravenous Q Sat-HD  . feeding supplement (PRO-STAT SUGAR FREE 64)  30 mL Oral BID  . gabapentin  100 mg Oral TID  . heparin  5,000 Units Subcutaneous Q8H  . insulin aspart  0-9 Units Subcutaneous TID WC  . insulin glargine  20 Units Subcutaneous QHS  . levothyroxine  125 mcg Oral Q0600  .  midodrine      . midodrine  15 mg Oral TID WC  . sevelamer carbonate  800 mg Oral TID WC   Continuous Infusions: . sodium chloride    . sodium chloride    . ceFEPime (MAXIPIME) IV 2 g (12/01/18 1536)  . vancomycin 1,000 mg (12/01/18 1432)  . vancomycin       LOS: 9 days   Time Spent in minutes   30 minutes  Hibah Odonnell D.O. on 12/03/2018 at 8:52 AM  Between 7am to 7pm - Please see pager noted on [email or web address]  After 7pm go to [email or web address]  And look for the night coverage person covering for me after hours  [facility] Office  [phone]

## 2018-12-03 NOTE — TOC Progression Note (Signed)
Transition of Care [facility]) - Progression Note    Patient Details  Name: [name] MRN: [id number] Date of Birth: 1956/02/02  [facility] of [facility]) CM/SW Contact  Jacalyn Lefevre Edson Snowball, RN Phone Number: 12/03/2018, 9:53 AM  Clinical Narrative:     Referred [facility] to [facility] and [facility]. Patient does not met insurance criteria for LTAC due to no progressive or ICU stay. Also, neither  LTAC had HD bed.  Expected Discharge Plan: [place] (LTAC)    Expected Discharge Plan and Services Expected Discharge Plan: Long Term Acute Care (LTAC)   Discharge Planning Services: CM Consult   Living arrangements for the past 2 months: Single Family Home Expected Discharge Date: 12/02/18               DME Arranged: N/A         HH Arranged: NA           Social Determinants of Health (SDOH) Interventions    Readmission Risk Interventions No flowsheet data found.

## 2018-12-03 NOTE — Progress Notes (Signed)
Patient called RN to bedside stating he cannot breathe. Patient on 4L [place]-87-97%, he is requesting his CPAP be placed.  Patient bumped patient up to 6L [place]- 02 99%. Patient still requesting CPAP to be placed. Respiratory paged, MD notified and Rapid response notified. ABGs ordered.

## 2018-12-04 DIAGNOSIS — L89104 Pressure ulcer of unspecified part of back, stage 4: Secondary | ICD-10-CM

## 2018-12-04 DIAGNOSIS — R627 Adult failure to thrive: Secondary | ICD-10-CM

## 2018-12-04 LAB — CBC
HCT: 30.7 % — ABNORMAL LOW (ref 39.0–52.0)
Hemoglobin: 8.5 g/dL — ABNORMAL LOW (ref 13.0–17.0)
MCH: 24.9 pg — ABNORMAL LOW (ref 26.0–34.0)
MCHC: 27.7 g/dL — ABNORMAL LOW (ref 30.0–36.0)
MCV: 90 fL (ref 80.0–100.0)
Platelets: 126 10*3/uL — ABNORMAL LOW (ref 150–400)
RBC: 3.41 MIL/uL — ABNORMAL LOW (ref 4.22–5.81)
RDW: 17.3 % — ABNORMAL HIGH (ref 11.5–15.5)
WBC: 17.8 10*3/uL — ABNORMAL HIGH (ref 4.0–10.5)
nRBC: 0.6 % — ABNORMAL HIGH (ref 0.0–0.2)

## 2018-12-04 LAB — RENAL FUNCTION PANEL
Albumin: 2.2 g/dL — ABNORMAL LOW (ref 3.5–5.0)
Anion gap: 11 (ref 5–15)
BUN: 33 mg/dL — ABNORMAL HIGH (ref 8–23)
CO2: 26 mmol/L (ref 22–32)
Calcium: 7.7 mg/dL — ABNORMAL LOW (ref 8.9–10.3)
Chloride: 94 mmol/L — ABNORMAL LOW (ref 98–111)
Creatinine, Ser: 2.44 mg/dL — ABNORMAL HIGH (ref 0.61–1.24)
GFR calc Af Amer: 32 mL/min — ABNORMAL LOW (ref >60)
GFR calc non Af Amer: 28 mL/min — ABNORMAL LOW (ref >60)
Glucose, Bld: 146 mg/dL — ABNORMAL HIGH (ref 70–99)
Phosphorus: 2.7 mg/dL (ref 2.5–4.6)
Potassium: 4.3 mmol/L (ref 3.5–5.1)
Sodium: 131 mmol/L — ABNORMAL LOW (ref 135–145)

## 2018-12-04 LAB — GLUCOSE, CAPILLARY
Glucose-Capillary: 170 mg/dL — ABNORMAL HIGH (ref 70–99)
Glucose-Capillary: 134 mg/dL — ABNORMAL HIGH (ref 70–99)
Glucose-Capillary: 141 mg/dL — ABNORMAL HIGH (ref 70–99)
Glucose-Capillary: 123 mg/dL — ABNORMAL HIGH (ref 70–99)
Glucose-Capillary: 130 mg/dL — ABNORMAL HIGH (ref 70–99)

## 2018-12-04 NOTE — Progress Notes (Addendum)
    [id number] decubitus  Subjective: Very anxious, refusing a lot of treatments, everything the staff does from air mattress to hydrotherapy causes what he perceives as SOB.  Objective: Vital signs in last 24 hours: Temp:  [97.5 F (36.4 C)-98.4 F (36.9 C)] 97.9 F (36.6 C) (10/07 0722) Pulse Rate:  [66-92] 81 (10/07 0900) Resp:  [10-28] 28 (10/07 0900) BP: (81-99)/(51-68) 87/62 (10/07 0956) SpO2:  [93 %-100 %] 94 % (10/07 0900) FiO2 (%):  [36 %] 36 % (10/06 1559) Weight:  [124.2 kg] 124.2 kg (10/06 1128) Last BM Date: 12/02/18  Intake/Output from previous day: 10/06 0701 - 10/07 0700 In: 520 [P.O.:520] Out: 213  Intake/Output this shift: No intake/output data recorded.  General appearance: alert and severly anxious, and does not want therapy for sacarl wound Picture below.  Site smells bad, with green drainage on dressing, some stool soiling also.      Lab Results:  Recent Labs    12/03/18 0423 12/04/18 0332  WBC 19.8* 17.8*  HGB 8.4* 8.5*  HCT 30.5* 30.7*  PLT 149* 126*    BMET Recent Labs    12/03/18 0423 12/04/18 0332  NA 128* 131*  K 5.0 4.3  CL 91* 94*  CO2 24 26  GLUCOSE 149* 146*  BUN 48* 33*  CREATININE 3.21* 2.44*  CALCIUM 7.0* 7.7*   PT/INR No results for input(s): LABPROT, INR in the last 72 hours.  Recent Labs  Lab 11/30/18 0705 12/01/18 0809 12/02/18 1106 12/03/18 0423 12/04/18 0332  ALBUMIN 2.1* 1.8* 2.0* 2.0* 2.2*     Lipase     Component Value Date/Time   LIPASE 11 10/10/2012 1306     Medications: . Chlorhexidine Gluconate Cloth  6 each Topical Q0600  . Chlorhexidine Gluconate Cloth  6 each Topical Q0600  . collagenase   Topical Daily  . darbepoetin (ARANESP) injection - DIALYSIS  100 mcg Intravenous Q Sat-HD  . feeding supplement (PRO-STAT SUGAR FREE 64)  30 mL Oral BID  . gabapentin  100 mg Oral TID  . heparin  5,000 Units Subcutaneous Q8H  . insulin aspart  0-9 Units Subcutaneous TID WC  . insulin glargine   20 Units Subcutaneous QHS  . levothyroxine  125 mcg Oral Q0600  . midodrine  15 mg Oral TID WC  . sevelamer carbonate  800 mg Oral TID WC    Assessment/Plan DM ESRD - TTS Hypotension Hypothyroidism HLD S/p Left AKA  Sacral wound -cleaning up some.  - cont hydrotherapy and add dakins for likely pseudomonas. No further surgical intervention at this time - will see a couple times a week to follow progress  - malodorous wound he is hesitant to have any therapy  ID -maxipime/vancomycin 9/27>> VTE -SCDs, sq heparin FEN -CM/HH diet Foley -urostomy Follow up -TBD  Plan:  Ongoing hydrotherapy, I tried to encourage him to have the dressing changes BID and hydrotherapy. I do not think there is any more we can do from a surgical standpoint.  He is refusing air bed.  Continue Dakin's also.      LOS: 10 days    Renne Platts 12/04/2018 [phone]

## 2018-12-04 NOTE — Progress Notes (Signed)
Physical Therapy Wound Treatment Patient Details  Name: [name] MRN: [id number] Date of Birth: 05/19/1955  Today's Date: 12/04/2018 Time: 1100-1200 Time Calculation (min): 60 min  Subjective  Subjective: Pt remains anxious Patient and Family Stated Goals: None stated.  Date of Onset: (Unknown) Prior Treatments: Bedside debridement 9/28  Pain Score:  Pt reports severe pain and SOB, however talking almost constantly throughout session and breathing did not seem labored - O2 sats >95% on supplemental O2. Pt's pain was localized to UE with only occasional reports of pain at sacrum.  Wound Assessment  Wound / Incision (Open or Dehisced) 11/25/18 Other (Comment) Sacrum Mid (Active)  Wound Image   12/04/18 1217  Dressing Type ABD;Moist to dry;Barrier Film (skin prep);Gauze (Comment) 12/04/18 1217  Dressing Changed Changed 12/04/18 1217  Dressing Status Clean;Dry;Intact 12/04/18 1217  Dressing Change Frequency Daily 12/04/18 1217  Site / Wound Assessment Pink;Red;Yellow;Brown 12/04/18 1217  % Wound base Red or Granulating 25% 12/04/18 1217  % Wound base Yellow/Fibrinous Exudate 50% 12/04/18 1217  % Wound base Black/Eschar 25% 12/04/18 1217  % Wound base Other/Granulation Tissue (Comment) 0% 12/04/18 1217  Peri-wound Assessment Intact;Pink;Induration 12/04/18 1217  Wound Length (cm) 15 cm 12/04/18 1119  Wound Width (cm) 12 cm 12/04/18 1119  Wound Depth (cm) 4.2 cm 12/04/18 1119  Wound Volume (cm^3) 756 cm^3 12/04/18 1119  Wound Surface Area (cm^2) 180 cm^2 12/04/18 1119  Undermining (cm) 2.8 cm from about 6:00-12:00 11/26/18 1000  Margins Unattached edges (unapproximated) 12/04/18 1217  Closure None 12/04/18 1217  Drainage Amount Moderate 12/04/18 1217  Drainage Description Purulent;Odor;Green 12/04/18 1217  Treatment Debridement (Selective);Hydrotherapy (Pulse lavage);Packing (Saline gauze) 12/04/18 1217   Dakin's applied to gauze prior to dressing placement.    Hydrotherapy  Pulsed lavage therapy - wound location: Sacrum Pulsed Lavage with Suction (psi): 12 psi(8-12) Pulsed Lavage with Suction - Normal Saline Used: 1000 mL Pulsed Lavage Tip: Tip with splash shield Selective Debridement Selective Debridement - Location: Sacrum Selective Debridement - Tools Used: Forceps;Scalpel;Scissors Selective Debridement - Tissue Removed: yellow and brown necrotic tissue   Wound Assessment and Plan  Wound Therapy - Assess/Plan/Recommendations Wound Therapy - Clinical Statement: Progressing with debridement. Dakin's initiated today. Will, PA present to assess wound with PT. Will continue to follow for selective removal of unviable tissue, to decrease bioburden and promote wound bed healing.  Wound Therapy - Functional Problem List: Decreased mobility, AKA, global weakness.  Factors Delaying/Impairing Wound Healing: Diabetes Mellitus;Immobility;Multiple medical problems Hydrotherapy Plan: Debridement;Dressing change;Patient/family education;Pulsatile lavage with suction Wound Therapy - Frequency: 6X / week Wound Therapy - Follow Up Recommendations: Skilled nursing facility Wound Plan: See above  Wound Therapy Goals- Improve the function of patient's integumentary system by progressing the wound(s) through the phases of wound healing (inflammation - proliferation - remodeling) by: Decrease Necrotic Tissue to: 20% Decrease Necrotic Tissue - Progress: Progressing toward goal Increase Granulation Tissue to: 80% Increase Granulation Tissue - Progress: Progressing toward goal Goals/treatment plan/discharge plan were made with and agreed upon by patient/family: Yes Time For Goal Achievement: 7 days Wound Therapy - Potential for Goals: Fair  Goals will be updated until maximal potential achieved or discharge criteria met.  Discharge criteria: when goals achieved, discharge from hospital, MD decision/surgical intervention, no progress towards goals, refusal/missing three  consecutive treatments without notification or medical reason.  GP     Thelma Comp 12/04/2018, 12:35 PM   Rolinda Roan, PT, DPT Acute Rehabilitation Services Pager: [phone] Office: [phone]

## 2018-12-04 NOTE — Progress Notes (Signed)
Patient complaining of SOB and stating that he wants his wife to come "immediately" because "I am dying.  I may be dead before she gets here."  V/S stable at this time.  Wife called and informed of his status and she states she will be here as soon as she can.  She stated to me "he is always saying this."  Will continue to closely monitor and update MD on his status.  Patient appears to calm with emotional support.

## 2018-12-04 NOTE — Progress Notes (Signed)
[facility] Progress Note   Subjective: Extremely anxious and has constant anxiety and SOB.  His UF on dialysis is limited by hypotension for which he is getting midodrine.    CXR yesterday with small R pleural effusion and atalectasis.  He really needs volume off and I've scheduled an extra rx today and he started crying.    Objective Vitals:   12/04/18 0722 12/04/18 0800 12/04/18 0900 12/04/18 0956  BP: (!) 92/57 (!) 89/51 (!) 84/55 (!) 87/62  Pulse: 79 77 81   Resp: 20 (!) 21 (!) 28   Temp: 97.9 F (36.6 C)     TempSrc: Oral     SpO2: 99% 99% 94%   Weight:      Height:       Physical Exam General: Obese, chronically ill appearing male in NAD Heart: S1,S2 RRR Lungs: tachypneic, clear anteriorly Abdomen: Active BS, obese, NT, subcutaneous bruising  Extremities: L AKA with 1+ stump edema. LLE 1+ edema. Dialysis Access: R AVF + bruit, aneurysmal SKIN: large stage IV sacral decub pics reviewed in Epic    Additional Objective Labs: Basic Metabolic Panel: Recent Labs  Lab 12/02/18 1106 12/03/18 0423 12/04/18 0332  NA 130* 128* 131*  K 4.9 5.0 4.3  CL 95* 91* 94*  CO2 23 24 26   GLUCOSE 95 149* 146*  BUN 39* 48* 33*  CREATININE 2.73* 3.21* 2.44*  CALCIUM 6.8* 7.0* 7.7*  PHOS 2.9 3.0 2.7   Liver Function Tests: Recent Labs  Lab 12/02/18 1106 12/03/18 0423 12/04/18 0332  ALBUMIN 2.0* 2.0* 2.2*   No results for input(s): LIPASE, AMYLASE in the last 168 hours. CBC: Recent Labs  Lab 11/29/18 0538 11/30/18 0705 12/01/18 0917 12/03/18 0423 12/04/18 0332  WBC 11.3* 13.1* 14.8* 19.8* 17.8*  HGB 9.3* 9.8* 8.5* 8.4* 8.5*  HCT 33.0* 34.7* 30.8* 30.5* 30.7*  MCV 90.4 89.7 89.3 88.9 90.0  PLT 111* 116* 113* 149* 126*   Blood Culture    Component Value Date/Time   SDES BLOOD LEFT ARM 12/28/2016 0305   SPECREQUEST IN PEDIATRIC BOTTLE Blood Culture adequate volume 12/28/2016 0305   CULT NO GROWTH 5 DAYS 12/28/2016 0305   REPTSTATUS 01/02/2017  FINAL 12/28/2016 0305    Cardiac Enzymes: No results for input(s): CKTOTAL, CKMB, CKMBINDEX, TROPONINI in the last 168 hours. CBG: Recent Labs  Lab 12/02/18 1940 12/03/18 1211 12/03/18 1838 12/03/18 2135 12/04/18 0719  GLUCAP 122* 130* 170* 162* 134*   Iron Studies: No results for input(s): IRON, TIBC, TRANSFERRIN, FERRITIN in the last 72 hours. @lablastinr3 @ Studies/Results: Dg Chest Port 1 View  Result Date: 12/03/2018 CLINICAL DATA:  63 year old with acute chest pain and shortness of breath. Current history of diabetes and hypertension. Current history of end-stage renal disease. EXAM: PORTABLE CHEST 1 VIEW COMPARISON:  11/19/2018 and earlier. FINDINGS: Suboptimal inspiration accounts for crowded bronchovascular markings, especially in the bases, and accentuates the cardiac silhouette. Taking this into account, cardiac silhouette moderately enlarged. Pulmonary venous hypertension without overt edema currently. Atelectasis in the LEFT lung base. Small RIGHT pleural effusions suspected with passive atelectasis in the RIGHT LOWER LOBE. Widening of the superior mediastinum, not significantly changed since July, 2020 but increased since February, 2020. IMPRESSION: 1. Suboptimal inspiration accounts for atelectasis in the LEFT lung base. 2. Small RIGHT pleural effusion suspected with passive atelectasis in the RIGHT LOWER LOBE. 3. Stable cardiomegaly without pulmonary edema. 4. Widening of the superior mediastinum, unchanged since July, 2020 but increased since February, 2020. When the patient is  clinically stable, CT of the chest with contrast is recommended in further evaluation. Electronically Signed   By: Evangeline Dakin M.D.   On: 12/03/2018 19:54   Medications: . ceFEPime (MAXIPIME) IV 2 g (12/01/18 1536)  . vancomycin Stopped (12/03/18 1146)  . vancomycin     . Chlorhexidine Gluconate Cloth  6 each Topical Q0600  . Chlorhexidine Gluconate Cloth  6 each Topical Q0600  . collagenase    Topical Daily  . darbepoetin (ARANESP) injection - DIALYSIS  100 mcg Intravenous Q Sat-HD  . feeding supplement (PRO-STAT SUGAR FREE 64)  30 mL Oral BID  . gabapentin  100 mg Oral TID  . heparin  5,000 Units Subcutaneous Q8H  . insulin aspart  0-9 Units Subcutaneous TID WC  . insulin glargine  20 Units Subcutaneous QHS  . levothyroxine  125 mcg Oral Q0600  . midodrine  15 mg Oral TID WC  . sevelamer carbonate  800 mg Oral TID WC     Dialysis Orders: [facility] on [place] [facility]  3h 2min 113kg (250 lbs) 2/2.5 bath  R AVF -No Heparin  -Mircera 200 mcg IV q 2 weeks last dose 8/27  Assessment/Plan: 1. Stage IV sacral decubitus ulcer - Limited mobility. Mostly wheelchair/bedbound. S/P bedside debridement per surgery 9/28. Hydrotherapy, Vanc, cefepime - per primary.Per PMD notes has been at [facility] in the past. 2. Abdominal Pain: CT shows small residual collection posterion schedule. r border of lived concerning for abscess. On Vanc/Cefepime.  3. ESRD -HD TTS. Continue on schedule. Had HD 10/06.  Low temp, got albumin.  Na 128 which notes vol overload, will do extra rx today 10/7 4. Hypotension/volume: On midodrine for BP support.Has BLE edema. HD 10/04 Pre wt 122 Net UF only 434 Post wt 121.7. Very much above OP EDW. SBP in 80s during HD. Continue Volume removal as tolerated with SBP > 85 5. Anemia - Hgb 8.5 Rec'd Aranesp 100 mcg IV 10/04. Follow HGB.  6. Metabolic bone disease -Phos/ C Ca at goal.  No Vit D as outpatient. Continue binders. 7. Nutrition -Albumin 1.9. Prot supp for low albumin  8. DM -insulin per primary  9. S/p cystectomy with urostomy 10. Dispo: possible LTACH--> he appears to be declining and I think his prognosis is extremely poor.  He has hypotension and volume overload which puts him in a difficult position for dialysis.  He is a very poor candidate for CRRT and intermittent dialysis on a pressor would possibly fix the volume problem transiently  but wouldn't translate to the outpatient setting.  The other option would be 4x a week dialysis which would negatively impact his QOL.  He has constant anxiety and SOB.  Palliative is following intermittently- but now that he's not able to achieve good UF with 3x weekly dialysis I would support them being called back to regroup re: [place].      Madelon Lips MD 12/04/2018, 12:06 PM  [facility] [phone]

## 2018-12-04 NOTE — Progress Notes (Signed)
Report given to [facility] in HD

## 2018-12-04 NOTE — Progress Notes (Signed)
Patient ID: [name], male   DOB: [birth year], 62 y.o.   MRN: [id number]  This NP spoke with patient's wife by telephone as f/u for palliative medicine needs and emotional support   9-year male with history of end-stage renal disease on dialysis, peripheral vascular disease status post left AKA, right femur fracture, diabetes, morbid obesity, hypertension, hyperlipidemia, infected sacral ulcer.    Patient is bed bound with difficult transfer to wheelchair at times.  He lives at home with his wife, who is the main caregiver.  She acknowledges continued physical and functional decline over the past few years, "since his leg fracture"  Patient is overall failing to thrive, his long term prognosis is poor.  Patient and his family face treatment option decisions, advanced directive decisions and anticipatory care needs.  Values and goals of care important to patient and family were attempted to be elicited.  Created space and opportunity for wife to explore her thoughts and feelings regarding current medical situation.  "I'm just not ready to say stop dialysis"   I raised awareness to the concepts of human morality and the limitations of medical interventions to prolong quality of life.  She speaks to her husbands faith and that "he knows where he is going"   Open to spiritual care consult  "We just need more time"  Recommended DNR/DNI status knowing poor outcomes in similar patients.  Mrs Boroughs tells me "not yet"  Emotional support offered.  Discussed with wife the importance of continued conversation with patient and  the  medical providers regarding overall plan of care and treatment options,  ensuring decisions are within the context of the patients values and GOCs   Questions and concerns addressed   Discussed with Dr Tawanna Solo  Total time spent on the unit was 35 minutes  Greater than 50% of the time was spent in counseling and coordination of care  Wadie Lessen NP   Palliative Medicine Team Team Phone # [phone] Pager [phone]

## 2018-12-04 NOTE — Progress Notes (Signed)
PROGRESS NOTE    [name]  [id number] DOB: [birth year] DOA: 11/20/2018 PCP: [facility]   Brief Narrative:  Patient is a 63-year male with history of end-stage renal disease on dialysis, peripheral vascular disease status post left AKA, right femur fracture, diabetes, morbid obesity, hypertension, hyperlipidemia who presented with infected sacral ulcer.  Patient is largely bedbound and sometimes ambulates with a wheelchair.  He reported developing a small ulcer about 4 weeks ago which has now progressed to stage IV sacral decubitus ulcer.  Reported pain and discharge from the ulcer.  There was no report of fever or chills at home.  Patient was admitted for the management of decubitus ulcer.  General surgery consulted and  Following.Palliative   Care consulted for goals of care discussion.  Assessment & Plan:   Active Problems:   Type 2 diabetes mellitus with diabetic nephropathy, with long-term current use of insulin (HCC)   OBESITY   Essential hypertension   End stage renal disease (HCC)   Chronic venous insufficiency   Unstageable pressure ulcer of right buttock (HCC)   Closed fracture of distal end of right femur (HCC)   S/P AKA (above knee amputation) unilateral, left (HCC)   Decubitus ulcer of sacral region, stage 4 ([place])   Palliative care encounter   Chronic respiratory failure with hypoxia ([place])   Stage IV sacral decubitus ulcer: Infected .  Underwent bedside debridement on 11/17/2018 by general surgery.  Currently on broad-spectrum antibiotics.  On hydrotherapy.  No cultures were obtained.  No leukocytosis or fever.  X-ray of sacrum/coccyx shows diffuse bony demineralization sacral ulcer extends to the level of bone.  Complains of severe back pain continue pain management.  Due to the nature of the ulcer and poor prognosis palliative  care consulted for goals of care discussion.  Acute on chronic hypoxic respiratory failure: On BiPAP at night.  Has been  on oxygen since August 2020 at 4 L at baseline, follows with pulmonology at [facility].  Chronic respiratory failure most likely secondary to OSA, pulmonary hypertension, CHF secondary to cardiorenal syndrome.  Continue oxygen supplementation.  Abdominal pain: Does not complain of abdomen pain today.  Recently hospitalized and had perihepatic abscess requiring drain and antibiotics.  CT abdomen/pelvis showed a small residual collection along the posterior border of the right liver lobe concerning for residual abscess/infection.  General surgery following. Has history of cystectomy and is a status post urostomy.  There is a green discharge from the urostomy bag.  ESRD on hemodialysis: Dialyzed on TTS.  Nephrology following and is on dialysis.  Dialysis being difficult due to hypotension.  Diabetes type 2: Continue current insulin regimen  Chronic hypotension: Continue midodrine  Hypothyroidism: Continue Synthyroid  Hyperlipidemia: Continue statin  Chronic normocytic anemia: Associated with CKD.  Currently H&H stable.  Goals of care: Palliative care was consulted earlier.  Palliative care discussed with family.  Wife wanted to continue full scope of treatment.  I also discussed with her today and she wants to continue current treatment.   I updated her about the current situation and prognosis and answered her questions.  At this point she said she wants to give him some time anticipating improvement.Remains full code           DVT prophylaxis: Heparin [place] Code Status: Full Family Communication: Wife present at the bedside Disposition Plan: Undetermined at this point.  Probably LTAC as patient continues to need hydrotherapy, wound care and dialysis.  Patient is aware  Consultants: Surgery, nephrology  Procedures: Debridement,  dialysis  Antimicrobials:  Anti-infectives (From admission, onward)   Start     Dose/Rate Route Frequency Ordered Stop   12/03/18 1044  vancomycin  (VANCOCIN) 1-5 GM/200ML-% IVPB    Note to Pharmacy: Wallace Cullens   : cabinet override      12/03/18 1044 12/03/18 2259   12/01/18 1425  vancomycin (VANCOCIN) 1-5 GM/200ML-% IVPB    Note to Pharmacy: Tawanna Solo   : cabinet override      12/01/18 1425 12/02/18 0229   12/01/18 1200  vancomycin (VANCOCIN) IVPB 1000 mg/200 mL premix     1,000 mg 200 mL/hr over 60 Minutes Intravenous  Once 11/30/18 1553     11/28/18 1003  vancomycin (VANCOCIN) 1-5 GM/200ML-% IVPB    Note to Pharmacy: Tawanna Solo   : cabinet override      11/28/18 1003 11/28/18 2214   11/26/18 1800  ceFEPIme (MAXIPIME) 2 g in sodium chloride 0.9 % 100 mL IVPB     2 g 200 mL/hr over 30 Minutes Intravenous Every T-Th-Sa (1800) 11/25/18 0916     11/26/18 1659  vancomycin (VANCOCIN) 1-5 GM/200ML-% IVPB    Note to Pharmacy: Cherylann Banas   : cabinet override      11/26/18 1659 11/26/18 2037   11/26/18 1200  vancomycin (VANCOCIN) IVPB 1000 mg/200 mL premix     1,000 mg 200 mL/hr over 60 Minutes Intravenous Every T-Th-Sa (Hemodialysis) 11/17/2018 1916     11/25/18 0945  ceFEPIme (MAXIPIME) 2 g in sodium chloride 0.9 % 100 mL IVPB     2 g 200 mL/hr over 30 Minutes Intravenous STAT 11/25/18 0915 11/25/18 1039   10/29/2018 1930  vancomycin (VANCOCIN) 2,500 mg in sodium chloride 0.9 % 500 mL IVPB     2,500 mg 250 mL/hr over 120 Minutes Intravenous  Once 11/10/2018 1847 10/29/2018 2342      Subjective:  Patient seen and examined at bedside this afternoon.  Currently hemodynamically stable.  Complains of severe pain on the sacral region from the ulcer.  Objective: Vitals:   12/04/18 1100 12/04/18 1200 12/04/18 1209 12/04/18 1300  BP: 101/62 101/61 102/63 100/61  Pulse: 79 77 76 79  Resp: (!) 21 (!) 21 19 17   Temp:   98.3 F (36.8 C)   TempSrc:   Oral   SpO2: 95% 97% 100% 100%  Weight:      Height:        Intake/Output Summary (Last 24 hours) at 12/04/2018 1332 Last data filed at 12/04/2018 0547 Gross per 24 hour  Intake  520 ml  Output --  Net 520 ml   Filed Weights   12/01/18 1624 12/03/18 0722 12/03/18 1128  Weight: 121.7 kg 124.2 kg 124.2 kg    Examination:  General exam: Chronically looking, morbidly obese, extremely deconditioned/debilitated  HEENT: Ear/Nose normal on gross exam Respiratory system: Bilateral decreased air entry in the bases Cardiovascular system: S1 & S2 heard, RRR. No JVD, murmurs, rubs, gallops or clicks. No pedal edema. Gastrointestinal system: Abdomen is distended, soft and nontender. Ostomy with green discharge Central nervous system: Alert and oriented. Extremities: Right AKA. Skin: Stage 4 sacral ulcer GU: Scrotal edema      Data Reviewed: I have personally reviewed following labs and imaging studies  CBC: Recent Labs  Lab 11/29/18 0538 11/30/18 0705 12/01/18 0917 12/03/18 0423 12/04/18 0332  WBC 11.3* 13.1* 14.8* 19.8* 17.8*  HGB 9.3* 9.8* 8.5* 8.4* 8.5*  HCT 33.0* 34.7* 30.8* 30.5* 30.7*  MCV 90.4 89.7 89.3 88.9 90.0  PLT 111* 116* 113* 149* 123XX123*   Basic Metabolic Panel: Recent Labs  Lab 11/28/18 0152  11/29/18 0538 11/30/18 0705 12/01/18 0809 12/02/18 0441 12/02/18 1106 12/03/18 0423 12/04/18 0332  NA 130*  --  131* 130* 128*  --  130* 128* 131*  K 5.1  --  4.6 5.5* 6.1*  --  4.9 5.0 4.3  CL 93*  --  95* 94* 94*  --  95* 91* 94*  CO2 23  --  23 25 21*  --  23 24 26   GLUCOSE 147*  --  132* 113* 89  --  95 149* 146*  BUN 48*  --  34* 51* 69*  --  39* 48* 33*  CREATININE 3.59*  --  2.69* 3.27* 3.93*  --  2.73* 3.21* 2.44*  CALCIUM 6.9*  --  6.9* 7.1* 6.7*  --  6.8* 7.0* 7.7*  MG 1.9  --  1.8  --   --  1.8  --   --   --   PHOS  --    < > 3.0 3.4 3.7  --  2.9 3.0 2.7   < > = values in this interval not displayed.   GFR: Estimated Creatinine Clearance: 38.9 mL/min (A) (by C-G formula based on SCr of 2.44 mg/dL (H)). Liver Function Tests: Recent Labs  Lab 11/30/18 0705 12/01/18 0809 12/02/18 1106 12/03/18 0423 12/04/18 0332  ALBUMIN  2.1* 1.8* 2.0* 2.0* 2.2*   No results for input(s): LIPASE, AMYLASE in the last 168 hours. No results for input(s): AMMONIA in the last 168 hours. Coagulation Profile: No results for input(s): INR, PROTIME in the last 168 hours. Cardiac Enzymes: No results for input(s): CKTOTAL, CKMB, CKMBINDEX, TROPONINI in the last 168 hours. BNP (last 3 results) No results for input(s): PROBNP in the last 8760 hours. HbA1C: No results for input(s): HGBA1C in the last 72 hours. CBG: Recent Labs  Lab 12/03/18 1211 12/03/18 1838 12/03/18 2135 12/04/18 0719 12/04/18 1205  GLUCAP 130* 170* 162* 134* 141*   Lipid Profile: No results for input(s): CHOL, HDL, LDLCALC, TRIG, CHOLHDL, LDLDIRECT in the last 72 hours. Thyroid Function Tests: No results for input(s): TSH, T4TOTAL, FREET4, T3FREE, THYROIDAB in the last 72 hours. Anemia Panel: No results for input(s): VITAMINB12, FOLATE, FERRITIN, TIBC, IRON, RETICCTPCT in the last 72 hours. Sepsis Labs: No results for input(s): PROCALCITON, LATICACIDVEN in the last 168 hours.  Recent Results (from the past 240 hour(s))  SARS CORONAVIRUS 2 (TAT 6-24 HRS) Nasopharyngeal Nasopharyngeal Swab     Status: None   Collection Time: 11/27/2018  3:10 PM   Specimen: Nasopharyngeal Swab  Result Value Ref Range Status   SARS Coronavirus 2 NEGATIVE NEGATIVE Final    Comment: (NOTE) SARS-CoV-2 target nucleic acids are NOT DETECTED. The SARS-CoV-2 RNA is generally detectable in upper and lower respiratory specimens during the acute phase of infection. Negative results do not preclude SARS-CoV-2 infection, do not rule out co-infections with other pathogens, and should not be used as the sole basis for treatment or other patient management decisions. Negative results must be combined with clinical observations, patient history, and epidemiological information. The expected result is Negative. Fact Sheet for Patients: SugarRoll.be Fact  Sheet for Healthcare Providers: https://www.woods-mathews.com/ This test is not yet approved or cleared by the [place] FDA and  has been authorized for detection and/or diagnosis of SARS-CoV-2 by FDA under an Emergency Use Authorization (EUA). This EUA will remain  in effect (  meaning this test can be used) for the duration of the COVID-19 declaration under Section 56 4(b)(1) of the Act, 21 U.S.C. section 360bbb-3(b)(1), unless the authorization is terminated or revoked sooner. Performed at [facility], [place] [address], [place], [place] [address]          Radiology Studies: Dg Chest Port 1 View  Result Date: 12/03/2018 CLINICAL DATA:  63 year old with acute chest pain and shortness of breath. Current history of diabetes and hypertension. Current history of end-stage renal disease. EXAM: PORTABLE CHEST 1 VIEW COMPARISON:  11/15/2018 and earlier. FINDINGS: Suboptimal inspiration accounts for crowded bronchovascular markings, especially in the bases, and accentuates the cardiac silhouette. Taking this into account, cardiac silhouette moderately enlarged. Pulmonary venous hypertension without overt edema currently. Atelectasis in the LEFT lung base. Small RIGHT pleural effusions suspected with passive atelectasis in the RIGHT LOWER LOBE. Widening of the superior mediastinum, not significantly changed since July, 2020 but increased since February, 2020. IMPRESSION: 1. Suboptimal inspiration accounts for atelectasis in the LEFT lung base. 2. Small RIGHT pleural effusion suspected with passive atelectasis in the RIGHT LOWER LOBE. 3. Stable cardiomegaly without pulmonary edema. 4. Widening of the superior mediastinum, unchanged since July, 2020 but increased since February, 2020. When the patient is clinically stable, CT of the chest with contrast is recommended in further evaluation. Electronically Signed   By: Evangeline Dakin M.D.   On: 12/03/2018 19:54        Scheduled  Meds:  Chlorhexidine Gluconate Cloth  6 each Topical Q0600   Chlorhexidine Gluconate Cloth  6 each Topical Q0600   collagenase   Topical Daily   darbepoetin (ARANESP) injection - DIALYSIS  100 mcg Intravenous Q Sat-HD   feeding supplement (PRO-STAT SUGAR FREE 64)  30 mL Oral BID   gabapentin  100 mg Oral TID   heparin  5,000 Units Subcutaneous Q8H   insulin aspart  0-9 Units Subcutaneous TID WC   insulin glargine  20 Units Subcutaneous QHS   levothyroxine  125 mcg Oral Q0600   midodrine  15 mg Oral TID WC   sevelamer carbonate  800 mg Oral TID WC   Continuous Infusions:  ceFEPime (MAXIPIME) IV 2 g (12/01/18 1536)   vancomycin Stopped (12/03/18 1146)   vancomycin       LOS: 10 days    Time spent: 35 mins.More than 50% of that time was spent in counseling and/or coordination of care.      Shelly Coss, MD [facility] Pager [phone]  If 7PM-7AM, please contact night-coverage [email or web address] Password [email or web address] 12/04/2018, 1:32 PM

## 2018-12-04 NOTE — Progress Notes (Signed)
Pharmacy Antibiotic Note  [name] is a 63 y.o. male admitted on 11/22/2018 with wound infection /decubitus ulcer.  Pharmacy consulted to dose Vancomycin and Cefepime for sepsis due to stage I sacral decubitus ulcer. ESRD on HD TTS. HD 10/1 on schedule.  WBC 17.8, afebrile. S/p bedside debridement 11/25/2018 Vanc and cefepime continued.  Height: 5\' 5"  (165.1 cm) Weight: 273 lb 13 oz (124.2 kg) IBW/kg (Calculated) : 61.5  Temp (24hrs), Avg:98 F (36.7 C), Min:97.5 F (36.4 C), Max:98.4 F (36.9 C)  Recent Labs  Lab 11/29/18 0538 11/30/18 0705 12/01/18 0809 12/01/18 0917 12/02/18 1106 12/03/18 0423 12/04/18 0332  WBC 11.3* 13.1*  --  14.8*  --  19.8* 17.8*  CREATININE 2.69* 3.27* 3.93*  --  2.73* 3.21* 2.44*    Estimated Creatinine Clearance: 38.9 mL/min (A) (by C-G formula based on SCr of 2.44 mg/dL (H)).    No Known Allergies  Antimicrobials this admission: 9/27 Vanc>> 9/28 Cefepime>>  Dose adjustments this admission:  Microbiology results: 9/27 COVID : negative . Plan: -Continue Cefepime 2 g IV qHD-TTS. -Continue Vancomyin 1000 mg after each HD on TTS - Patients HD schedule appears to be consistent  - Will plan to obtain vancomycin level with AM labs (10/8) prior to HD on Thursday  -Continue to monitor WBC and HD sessions   Thank you for allowing pharmacy to be a part of this patient's care.  Duanne Limerick PharmD. BCPS  [id number] Please check AMION for all [place] phone numbers After 10:00 PM, call [place] [phone] 12/04/2018 9:35 AM

## 2018-12-05 ENCOUNTER — Inpatient Hospital Stay (HOSPITAL_COMMUNITY): Payer: Medicare Other | Admitting: Certified Registered Nurse Anesthetist

## 2018-12-05 ENCOUNTER — Inpatient Hospital Stay (HOSPITAL_COMMUNITY): Payer: Medicare Other

## 2018-12-05 DIAGNOSIS — I5033 Acute on chronic diastolic (congestive) heart failure: Secondary | ICD-10-CM

## 2018-12-05 LAB — POCT I-STAT 7, (LYTES, BLD GAS, ICA,H+H)
Acid-Base Excess: 3 mmol/L — ABNORMAL HIGH (ref 0.0–2.0)
Bicarbonate: 30.2 mmol/L — ABNORMAL HIGH (ref 20.0–28.0)
Calcium, Ion: 1.28 mmol/L (ref 1.15–1.40)
HCT: 30 % — ABNORMAL LOW (ref 39.0–52.0)
Hemoglobin: 10.2 g/dL — ABNORMAL LOW (ref 13.0–17.0)
O2 Saturation: 98 %
Patient temperature: 96.3
Potassium: 3.9 mmol/L (ref 3.5–5.1)
Sodium: 136 mmol/L (ref 135–145)
TCO2: 32 mmol/L (ref 22–32)
pCO2 arterial: 57.4 mmHg — ABNORMAL HIGH (ref 32.0–48.0)
pH, Arterial: 7.322 — ABNORMAL LOW (ref 7.350–7.450)
pO2, Arterial: 107 mmHg (ref 83.0–108.0)

## 2018-12-05 LAB — ECHOCARDIOGRAM COMPLETE
Height: 65 in
Weight: 4349.23 oz

## 2018-12-05 LAB — CBC WITH DIFFERENTIAL/PLATELET
Abs Immature Granulocytes: 0.59 10*3/uL — ABNORMAL HIGH (ref 0.00–0.07)
Basophils Absolute: 0.1 10*3/uL (ref 0.0–0.1)
Basophils Relative: 0 %
Eosinophils Absolute: 0 10*3/uL (ref 0.0–0.5)
Eosinophils Relative: 0 %
HCT: 32.3 % — ABNORMAL LOW (ref 39.0–52.0)
Hemoglobin: 8.8 g/dL — ABNORMAL LOW (ref 13.0–17.0)
Immature Granulocytes: 3 %
Lymphocytes Relative: 2 %
Lymphs Abs: 0.5 10*3/uL — ABNORMAL LOW (ref 0.7–4.0)
MCH: 25.1 pg — ABNORMAL LOW (ref 26.0–34.0)
MCHC: 27.2 g/dL — ABNORMAL LOW (ref 30.0–36.0)
MCV: 92 fL (ref 80.0–100.0)
Monocytes Absolute: 0.9 10*3/uL (ref 0.1–1.0)
Monocytes Relative: 4 %
Neutro Abs: 21.1 10*3/uL — ABNORMAL HIGH (ref 1.7–7.7)
Neutrophils Relative %: 91 %
Platelets: 135 10*3/uL — ABNORMAL LOW (ref 150–400)
RBC: 3.51 MIL/uL — ABNORMAL LOW (ref 4.22–5.81)
RDW: 17.9 % — ABNORMAL HIGH (ref 11.5–15.5)
WBC: 23.2 10*3/uL — ABNORMAL HIGH (ref 4.0–10.5)
nRBC: 0.6 % — ABNORMAL HIGH (ref 0.0–0.2)

## 2018-12-05 LAB — RENAL FUNCTION PANEL
Albumin: 2.6 g/dL — ABNORMAL LOW (ref 3.5–5.0)
Anion gap: 8 (ref 5–15)
BUN: 21 mg/dL (ref 8–23)
CO2: 27 mmol/L (ref 22–32)
Calcium: 8.3 mg/dL — ABNORMAL LOW (ref 8.9–10.3)
Chloride: 99 mmol/L (ref 98–111)
Creatinine, Ser: 2 mg/dL — ABNORMAL HIGH (ref 0.61–1.24)
GFR calc Af Amer: 41 mL/min — ABNORMAL LOW (ref >60)
GFR calc non Af Amer: 35 mL/min — ABNORMAL LOW (ref >60)
Glucose, Bld: 113 mg/dL — ABNORMAL HIGH (ref 70–99)
Phosphorus: 2.9 mg/dL (ref 2.5–4.6)
Potassium: 4.7 mmol/L (ref 3.5–5.1)
Sodium: 134 mmol/L — ABNORMAL LOW (ref 135–145)

## 2018-12-05 LAB — GLUCOSE, CAPILLARY
Glucose-Capillary: 112 mg/dL — ABNORMAL HIGH (ref 70–99)
Glucose-Capillary: 121 mg/dL — ABNORMAL HIGH (ref 70–99)
Glucose-Capillary: 122 mg/dL — ABNORMAL HIGH (ref 70–99)
Glucose-Capillary: 138 mg/dL — ABNORMAL HIGH (ref 70–99)
Glucose-Capillary: 151 mg/dL — ABNORMAL HIGH (ref 70–99)

## 2018-12-05 LAB — BASIC METABOLIC PANEL
Anion gap: 13 (ref 5–15)
BUN: 20 mg/dL (ref 8–23)
CO2: 24 mmol/L (ref 22–32)
Calcium: 8.6 mg/dL — ABNORMAL LOW (ref 8.9–10.3)
Chloride: 98 mmol/L (ref 98–111)
Creatinine, Ser: 1.84 mg/dL — ABNORMAL HIGH (ref 0.61–1.24)
GFR calc Af Amer: 45 mL/min — ABNORMAL LOW (ref >60)
GFR calc non Af Amer: 39 mL/min — ABNORMAL LOW (ref >60)
Glucose, Bld: 123 mg/dL — ABNORMAL HIGH (ref 70–99)
Potassium: 3.6 mmol/L (ref 3.5–5.1)
Sodium: 135 mmol/L (ref 135–145)

## 2018-12-05 LAB — BRAIN NATRIURETIC PEPTIDE: B Natriuretic Peptide: 1322.9 pg/mL — ABNORMAL HIGH (ref 0.0–100.0)

## 2018-12-05 LAB — VANCOMYCIN, RANDOM: Vancomycin Rm: 14

## 2018-12-05 LAB — PROCALCITONIN: Procalcitonin: 5.17 ng/mL

## 2018-12-05 LAB — MAGNESIUM: Magnesium: 1.9 mg/dL (ref 1.7–2.4)

## 2018-12-05 MED ORDER — PHENYLEPHRINE HCL (PRESSORS) 10 MG/ML IV SOLN
INTRAVENOUS | Status: DC | PRN
  Administered 2018-12-05 (×2): 120 ug via INTRAVENOUS

## 2018-12-05 MED ORDER — ALBUMIN HUMAN 25 % IV SOLN
25.0000 g | Freq: Once | INTRAVENOUS | Status: AC
  Administered 2018-12-05: 01:00:00 25 g via INTRAVENOUS

## 2018-12-05 MED ORDER — LEVOTHYROXINE SODIUM 125 MCG PO TABS
125.0000 ug | ORAL_TABLET | Freq: Every day | ORAL | Status: DC
  Administered 2018-12-06 – 2018-12-08 (×3): 125 ug via NASOGASTRIC
  Filled 2018-12-05 (×3): qty 1

## 2018-12-05 MED ORDER — CHLORHEXIDINE GLUCONATE 0.12% ORAL RINSE (MEDLINE KIT)
15.0000 mL | Freq: Two times a day (BID) | OROMUCOSAL | Status: DC
  Administered 2018-12-05 – 2018-12-08 (×6): 15 mL via OROMUCOSAL

## 2018-12-05 MED ORDER — SODIUM CHLORIDE 0.9 % IV SOLN
1.0000 g | INTRAVENOUS | Status: DC
  Administered 2018-12-05: 1 g via INTRAVENOUS
  Filled 2018-12-05 (×2): qty 1

## 2018-12-05 MED ORDER — LIDOCAINE HCL (CARDIAC) PF 100 MG/5ML IV SOSY
PREFILLED_SYRINGE | INTRAVENOUS | Status: DC | PRN
  Administered 2018-12-05: 60 mg via INTRAVENOUS

## 2018-12-05 MED ORDER — INSULIN ASPART 100 UNIT/ML ~~LOC~~ SOLN
0.0000 [IU] | Freq: Four times a day (QID) | SUBCUTANEOUS | Status: DC
  Administered 2018-12-05 (×2): 1 [IU] via SUBCUTANEOUS

## 2018-12-05 MED ORDER — FENTANYL BOLUS VIA INFUSION
50.0000 ug | INTRAVENOUS | Status: DC | PRN
  Administered 2018-12-06 – 2018-12-07 (×7): 50 ug via INTRAVENOUS
  Filled 2018-12-05: qty 50

## 2018-12-05 MED ORDER — IOHEXOL 350 MG/ML SOLN
80.0000 mL | Freq: Once | INTRAVENOUS | Status: AC | PRN
  Administered 2018-12-05: 13:00:00 80 mL via INTRAVENOUS

## 2018-12-05 MED ORDER — FENTANYL 2500MCG IN NS 250ML (10MCG/ML) PREMIX INFUSION
50.0000 ug/h | INTRAVENOUS | Status: DC
  Administered 2018-12-05: 25 ug/h via INTRAVENOUS
  Administered 2018-12-05: 50 ug/h via INTRAVENOUS
  Administered 2018-12-06: 100 ug/h via INTRAVENOUS
  Administered 2018-12-07: 200 ug/h via INTRAVENOUS
  Filled 2018-12-05 (×3): qty 250

## 2018-12-05 MED ORDER — ALBUMIN HUMAN 25 % IV SOLN
INTRAVENOUS | Status: AC
  Administered 2018-12-05: 25 g via INTRAVENOUS
  Filled 2018-12-05: qty 100

## 2018-12-05 MED ORDER — PERFLUTREN LIPID MICROSPHERE
1.0000 mL | INTRAVENOUS | Status: AC | PRN
  Administered 2018-12-05: 11:00:00 2 mL via INTRAVENOUS
  Filled 2018-12-05: qty 10

## 2018-12-05 MED ORDER — LACTATED RINGERS IV BOLUS
500.0000 mL | Freq: Once | INTRAVENOUS | Status: AC
  Administered 2018-12-05: 500 mL via INTRAVENOUS

## 2018-12-05 MED ORDER — ORAL CARE MOUTH RINSE
15.0000 mL | OROMUCOSAL | Status: DC
  Administered 2018-12-05 – 2018-12-08 (×27): 15 mL via OROMUCOSAL

## 2018-12-05 MED ORDER — INSULIN ASPART 100 UNIT/ML ~~LOC~~ SOLN
0.0000 [IU] | SUBCUTANEOUS | Status: DC
  Administered 2018-12-05 – 2018-12-06 (×4): 2 [IU] via SUBCUTANEOUS
  Administered 2018-12-06: 1 [IU] via SUBCUTANEOUS
  Administered 2018-12-06: 08:00:00 2 [IU] via SUBCUTANEOUS
  Administered 2018-12-06: 5 [IU] via SUBCUTANEOUS
  Administered 2018-12-07 (×2): 1 [IU] via SUBCUTANEOUS
  Administered 2018-12-07: 04:00:00 2 [IU] via SUBCUTANEOUS
  Administered 2018-12-07: 08:00:00 1 [IU] via SUBCUTANEOUS
  Administered 2018-12-07 – 2018-12-08 (×8): 2 [IU] via SUBCUTANEOUS
  Administered 2018-12-09: 08:00:00 1 [IU] via SUBCUTANEOUS
  Administered 2018-12-09 – 2018-12-10 (×7): 2 [IU] via SUBCUTANEOUS
  Administered 2018-12-10 (×2): 3 [IU] via SUBCUTANEOUS
  Administered 2018-12-10: 16:00:00 1 [IU] via SUBCUTANEOUS
  Administered 2018-12-11 (×2): 2 [IU] via SUBCUTANEOUS

## 2018-12-05 MED ORDER — NOREPINEPHRINE 4 MG/250ML-% IV SOLN
0.0000 ug/min | INTRAVENOUS | Status: DC
  Administered 2018-12-05: 6 ug/min via INTRAVENOUS
  Administered 2018-12-06: 5 ug/min via INTRAVENOUS
  Administered 2018-12-06: 6 ug/min via INTRAVENOUS
  Administered 2018-12-07: 4 ug/min via INTRAVENOUS
  Administered 2018-12-07: 18:00:00 6 ug/min via INTRAVENOUS
  Administered 2018-12-08 (×4): 40 ug/min via INTRAVENOUS
  Administered 2018-12-08: 02:00:00 9 ug/min via INTRAVENOUS
  Filled 2018-12-05 (×11): qty 250

## 2018-12-05 MED ORDER — PRO-STAT SUGAR FREE PO LIQD
60.0000 mL | Freq: Three times a day (TID) | ORAL | Status: DC
  Administered 2018-12-05 – 2018-12-08 (×9): 60 mL
  Filled 2018-12-05 (×9): qty 60

## 2018-12-05 MED ORDER — VITAL 1.5 CAL PO LIQD
1000.0000 mL | ORAL | Status: DC
  Administered 2018-12-05 – 2018-12-08 (×4): 1000 mL
  Filled 2018-12-05 (×4): qty 1000

## 2018-12-05 MED ORDER — FENTANYL CITRATE (PF) 100 MCG/2ML IJ SOLN
50.0000 ug | INTRAMUSCULAR | Status: DC | PRN
  Administered 2018-12-05: 50 ug via INTRAVENOUS

## 2018-12-05 MED ORDER — SEVELAMER CARBONATE 800 MG PO TABS
800.0000 mg | ORAL_TABLET | Freq: Three times a day (TID) | ORAL | Status: DC
  Administered 2018-12-05 – 2018-12-06 (×3): 800 mg via NASOGASTRIC
  Filled 2018-12-05 (×4): qty 1

## 2018-12-05 MED ORDER — SODIUM CHLORIDE 0.9 % IV BOLUS
500.0000 mL | Freq: Once | INTRAVENOUS | Status: AC
  Administered 2018-12-05: 500 mL via INTRAVENOUS

## 2018-12-05 MED ORDER — ALBUMIN HUMAN 5 % IV SOLN
INTRAVENOUS | Status: AC
  Administered 2018-12-05: 12.5 g via INTRAVENOUS
  Filled 2018-12-05: qty 250

## 2018-12-05 MED ORDER — MIDODRINE HCL 5 MG PO TABS
15.0000 mg | ORAL_TABLET | Freq: Three times a day (TID) | ORAL | Status: DC
  Administered 2018-12-05 – 2018-12-08 (×9): 15 mg via NASOGASTRIC
  Filled 2018-12-05 (×9): qty 3

## 2018-12-05 MED ORDER — FENTANYL CITRATE (PF) 100 MCG/2ML IJ SOLN
50.0000 ug | INTRAMUSCULAR | Status: DC | PRN
  Filled 2018-12-05: qty 2

## 2018-12-05 MED ORDER — FENTANYL CITRATE (PF) 100 MCG/2ML IJ SOLN
50.0000 ug | Freq: Once | INTRAMUSCULAR | Status: AC
  Administered 2018-12-05: 50 ug via INTRAVENOUS

## 2018-12-05 MED ORDER — PANTOPRAZOLE SODIUM 40 MG PO PACK
40.0000 mg | PACK | Freq: Every day | ORAL | Status: DC
  Administered 2018-12-05 – 2018-12-07 (×3): 40 mg
  Filled 2018-12-05 (×4): qty 20

## 2018-12-05 MED ORDER — ETOMIDATE 2 MG/ML IV SOLN
INTRAVENOUS | Status: DC | PRN
  Administered 2018-12-05: 10 mg via INTRAVENOUS

## 2018-12-05 MED ORDER — ALBUMIN HUMAN 5 % IV SOLN
12.5000 g | Freq: Once | INTRAVENOUS | Status: AC
  Administered 2018-12-05: 17:00:00 12.5 g via INTRAVENOUS

## 2018-12-05 MED ORDER — SUCCINYLCHOLINE CHLORIDE 20 MG/ML IJ SOLN
INTRAMUSCULAR | Status: DC | PRN
Start: 2018-12-05 — End: 2018-12-05
  Administered 2018-12-05: 120 mg via INTRAVENOUS

## 2018-12-05 NOTE — Progress Notes (Signed)
Pharmacy Antibiotic Note  [name] is a 63 y.o. male admitted on 11/13/2018 with wound infection /decubitus ulcer.  Pharmacy consulted to dose Vancomycin and Cefepime for sepsis due to stage I sacral decubitus ulcer.  WBC increased at 23.2 today from 17.8. He remains afebrile. Has ESRD and has been receiving HD. He is usually on a TTS schedule but is currently off schedule given his worsening clinical status. He did not receive HD today but went to HD on Tuesday and Wednesday this week.   Random vancomycin level this AM post-HD was 14. Will continue giving Vancomycin 1000 mg IV post each HD session. Given his unpredictable HD schedule, will change cefepime to a daily frequency.    Height: 5\' 5"  (165.1 cm) Weight: 271 lb 13.2 oz (123.3 kg) IBW/kg (Calculated) : 61.5  Temp (24hrs), Avg:97.5 F (36.4 C), Min:95.2 F (35.1 C), Max:98.8 F (37.1 C)  Recent Labs  Lab 11/30/18 0705  12/01/18 0917 12/02/18 1106 12/03/18 0423 12/04/18 0332 12/05/18 0705 12/05/18 1118  WBC 13.1*  --  14.8*  --  19.8* 17.8* 23.2*  --   CREATININE 3.27*   < >  --  2.73* 3.21* 2.44* 1.84* 2.00*  VANCORANDOM  --   --   --   --   --   --  14  --    < > = values in this interval not displayed.    Estimated Creatinine Clearance: 47.3 mL/min (A) (by C-G formula based on SCr of 2 mg/dL (H)).    No Known Allergies  Antimicrobials this admission: 9/27 Vanc >> 9/28 Cefepime >>  Microbiology results: 9/27 COVID: negative 10/8 BCx: sent . Plan: - Decrease Cefepime to 1 g IV Q24 hrs - Continue Vancomycin 1000 mg after each HD session  - F/u HD plans - Continue to monitor cultures and sensitivities and clinical progression  Richardine Service, PharmD PGY1 Pharmacy Resident Phone: [phone] 12/05/2018  4:23 PM  Please check [email or web address] for unit-specific pharmacy phone numbers.

## 2018-12-05 NOTE — Progress Notes (Signed)
PROGRESS NOTE    [name]  [id number] DOB: 03-12-1955 DOA: 11/01/2018 PCP: [place]   Brief Narrative:  Patient is a 63-year male with history of end-stage renal disease on dialysis, peripheral vascular disease status post left AKA, right femur fracture, diabetes, morbid obesity, hypertension, hyperlipidemia who presented with infected sacral ulcer.  Patient is largely bedbound and sometimes ambulates with a wheelchair.  He reported developing a small ulcer about 4 weeks ago which has now progressed to stage IV sacral decubitus ulcer.  Reported pain and discharge from the ulcer.  There was no report of fever or chills at home.  Patient was admitted for the management of decubitus ulcer.  General surgery consulted and  Following.Palliative   Care consulted for goals of care discussion.  12/05/18: Patient became hypoxic requiring high flow oxygen, hypothermic, lethargic and hypotensive this morning.  I again discussed with wife about goals of care and she wants full scope of treatment at present.  Patient evaluated stat, his saturation was around 84% on BiPAP and he was found to be very lethargic.  I called anesthesia for intubation. I talked to PCCM and transfer the patient.  Assessment & Plan:   Active Problems:   Type 2 diabetes mellitus with diabetic nephropathy, with long-term current use of insulin (HCC)   OBESITY   Essential hypertension   End stage renal disease (HCC)   Chronic venous insufficiency   Unstageable pressure ulcer of right buttock (HCC)   Closed fracture of distal end of right femur (HCC)   S/P AKA (above knee amputation) unilateral, left (HCC)   Decubitus ulcer of sacral region, stage 4 ([place])   DNR (do not resuscitate) discussion   Chronic respiratory failure with hypoxia ([place])   Adult failure to thrive     Acute on chronic hypoxic respiratory failure: Hypoxic at bipap this morning.  Has been on oxygen since August 2020 at 4 L at  baseline, follows with pulmonology at [facility].  Chronic respiratory failure most likely secondary to OSA, pulmonary hypertension, CHF secondary to cardiorenal syndrome. Intubated at bedside by anesthesia.  Sepsis/septic shock: Most likely from infected sacral decubitus ulcer.  Also has abdominal abscess for which general surgery is following.  His urostomy was also draining greenish fluid.  Patient became hypothermic today.  Hypotensive.  Continue broad-spectrum antibiotics  Stage IV sacral decubitus ulcer: Infected .  Underwent bedside debridement on 11/17/2018 by general surgery.  Currently on broad-spectrum antibiotics.  On hydrotherapy.  No cultures were obtained.  No leukocytosis or fever.  X-ray of sacrum/coccyx shows diffuse bony demineralization sacral ulcer extends to the level of bone.  Complains of severe back pain continue pain management.  Due to the nature of the ulcer and poor prognosis palliative  care consulted for goals of care discussion.  Abdominal pain:   Recently hospitalized and had perihepatic abscess requiring drain and antibiotics.  CT abdomen/pelvis showed a small residual collection along the posterior border of the right liver lobe concerning for residual abscess/infection.  General surgery following. Has history of cystectomy and is a status post urostomy.  There is a green discharge from the urostomy bag.  ESRD on hemodialysis: Dialyzed on TTS.  Nephrology following and is on dialysis.  Dialysis being difficult due to hypotension.  Diabetes type 2: Continue current insulin regimen  Chronic hypotension: Continue midodrine  Hypothyroidism: Continue Synthyroid  Hyperlipidemia: Continue statin  Chronic normocytic anemia: Associated with CKD.  Currently H&H stable.  Goals of care: Palliative  care was following.  Palliative care discussed with family.  Wife wanted to continue full scope of treatment.  I also discussed with her today and she wants to continue current  treatment.   I updated her about the current situation and prognosis and answered her questions. .Remains full code           DVT prophylaxis: Heparin [place] Code Status: Full Family Communication: Wife on phone Disposition Plan: Undetermined at this point.   Consultants: Surgery, nephrology  Procedures: Debridement,  dialysis  Antimicrobials:  Anti-infectives (From admission, onward)   Start     Dose/Rate Route Frequency Ordered Stop   12/03/18 1044  vancomycin (VANCOCIN) 1-5 GM/200ML-% IVPB    Note to Pharmacy: Wallace Cullens   : cabinet override      12/03/18 1044 12/03/18 2259   12/01/18 1425  vancomycin (VANCOCIN) 1-5 GM/200ML-% IVPB    Note to Pharmacy: Tawanna Solo   : cabinet override      12/01/18 1425 12/02/18 0229   12/01/18 1200  vancomycin (VANCOCIN) IVPB 1000 mg/200 mL premix  Status:  Discontinued     1,000 mg 200 mL/hr over 60 Minutes Intravenous  Once 11/30/18 1553 12/05/18 1110   11/28/18 1003  vancomycin (VANCOCIN) 1-5 GM/200ML-% IVPB    Note to Pharmacy: Tawanna Solo   : cabinet override      11/28/18 1003 11/28/18 2214   11/26/18 1800  ceFEPIme (MAXIPIME) 2 g in sodium chloride 0.9 % 100 mL IVPB     2 g 200 mL/hr over 30 Minutes Intravenous Every T-Th-Sa (1800) 11/25/18 0916     11/26/18 1659  vancomycin (VANCOCIN) 1-5 GM/200ML-% IVPB    Note to Pharmacy: Cherylann Banas   : cabinet override      11/26/18 1659 11/26/18 2037   11/26/18 1200  vancomycin (VANCOCIN) IVPB 1000 mg/200 mL premix     1,000 mg 200 mL/hr over 60 Minutes Intravenous Every T-Th-Sa (Hemodialysis) 11/23/2018 1916     11/25/18 0945  ceFEPIme (MAXIPIME) 2 g in sodium chloride 0.9 % 100 mL IVPB     2 g 200 mL/hr over 30 Minutes Intravenous STAT 11/25/18 0915 11/25/18 1039   11/12/2018 1930  vancomycin (VANCOCIN) 2,500 mg in sodium chloride 0.9 % 500 mL IVPB     2,500 mg 250 mL/hr over 120 Minutes Intravenous  Once 11/04/2018 1847 11/26/2018 2342       Objective: Vitals:   12/05/18 0844  12/05/18 0900 12/05/18 0914 12/05/18 0958  BP:  (!) 70/51 (!) 73/52 122/79  Pulse:  78 73 73  Resp:  15 16 16   Temp:      TempSrc:      SpO2: 90% (!) 86% (!) 84%   Weight:      Height:        Intake/Output Summary (Last 24 hours) at 12/05/2018 1119 Last data filed at 12/05/2018 0421 Gross per 24 hour  Intake 330 ml  Output 935 ml  Net -605 ml   Filed Weights   12/03/18 1128 12/05/18 0040 12/05/18 0421  Weight: 124.2 kg 124.2 kg 123.3 kg    Examination:  General exam:Lethargic, Chronically looking, morbidly obese, extremely deconditioned/debilitated  HEENT: Ear/Nose normal on gross exam Respiratory system: Bilateral decreased bilaterally, BiPAP Cardiovascular system: Hypotension, normal sinus rhythm Gastrointestinal system: Abdomen is distended, soft and nontender. Ostomy with green discharge Central nervous system: Very lethargic. Extremities: Right AKA. Skin: Stage 4 sacral ulcer GU: Scrotal edema      Data Reviewed: I have personally  reviewed following labs and imaging studies  CBC: Recent Labs  Lab 11/30/18 0705 12/01/18 0917 12/03/18 0423 12/04/18 0332 12/05/18 0705  WBC 13.1* 14.8* 19.8* 17.8* 23.2*  NEUTROABS  --   --   --   --  21.1*  HGB 9.8* 8.5* 8.4* 8.5* 8.8*  HCT 34.7* 30.8* 30.5* 30.7* 32.3*  MCV 89.7 89.3 88.9 90.0 92.0  PLT 116* 113* 149* 126* [id number]*   Basic Metabolic Panel: Recent Labs  Lab 11/29/18 0538 11/30/18 0705 12/01/18 0809 12/02/18 0441 12/02/18 1106 12/03/18 0423 12/04/18 0332 12/05/18 0705  NA 131* 130* 128*  --  130* 128* 131* 135  K 4.6 5.5* 6.1*  --  4.9 5.0 4.3 3.6  CL 95* 94* 94*  --  95* 91* 94* 98  CO2 23 25 21*  --  23 24 26 24   GLUCOSE 132* 113* 89  --  95 149* 146* 123*  BUN 34* 51* 69*  --  39* 48* 33* 20  CREATININE 2.69* 3.27* 3.93*  --  2.73* 3.21* 2.44* 1.84*  CALCIUM 6.9* 7.1* 6.7*  --  6.8* 7.0* 7.7* 8.6*  MG 1.8  --   --  1.8  --   --   --   --   PHOS 3.0 3.4 3.7  --  2.9 3.0 2.7  --     GFR: Estimated Creatinine Clearance: 51.4 mL/min (A) (by C-G formula based on SCr of 1.84 mg/dL (H)). Liver Function Tests: Recent Labs  Lab 11/30/18 0705 12/01/18 0809 12/02/18 1106 12/03/18 0423 12/04/18 0332  ALBUMIN 2.1* 1.8* 2.0* 2.0* 2.2*   No results for input(s): LIPASE, AMYLASE in the last 168 hours. No results for input(s): AMMONIA in the last 168 hours. Coagulation Profile: No results for input(s): INR, PROTIME in the last 168 hours. Cardiac Enzymes: No results for input(s): CKTOTAL, CKMB, CKMBINDEX, TROPONINI in the last 168 hours. BNP (last 3 results) No results for input(s): PROBNP in the last 8760 hours. HbA1C: No results for input(s): HGBA1C in the last 72 hours. CBG: Recent Labs  Lab 12/04/18 1205 12/04/18 1611 12/04/18 2108 12/05/18 0748 12/05/18 0939  GLUCAP 141* 123* 130* 112* 122*   Lipid Profile: No results for input(s): CHOL, HDL, LDLCALC, TRIG, CHOLHDL, LDLDIRECT in the last 72 hours. Thyroid Function Tests: No results for input(s): TSH, T4TOTAL, FREET4, T3FREE, THYROIDAB in the last 72 hours. Anemia Panel: No results for input(s): VITAMINB12, FOLATE, FERRITIN, TIBC, IRON, RETICCTPCT in the last 72 hours. Sepsis Labs: No results for input(s): PROCALCITON, LATICACIDVEN in the last 168 hours.  No results found for this or any previous visit (from the past 240 hour(s)).       Radiology Studies: Dg Chest 1 View  Result Date: 12/05/2018 CLINICAL DATA:  Shortness of breath, intubation EXAM: CHEST  1 VIEW COMPARISON:  12/03/2018 FINDINGS: Interval endotracheal intubation, tip positioned over the mid trachea. Low volume examination with extensive, new perihilar heterogeneous airspace opacity of the right lung and an unchanged focal opacity of the peripheral left lower lung. Cardiomegaly, this appearance likely exaggerated by low volume technique. IMPRESSION: 1. Interval endotracheal intubation, tip positioned over the mid trachea. 2. Low volume  examination with extensive, new perihilar heterogeneous airspace opacity of the right lung, which may reflect asymmetric edema or infection, and an unchanged focal opacity of the peripheral left lower lung. 3. Cardiomegaly, this appearance likely exaggerated by low volume technique. Electronically Signed   By: Eddie Candle M.D.   On: 12/05/2018 10:10   Dg Chest  Port 1 View  Result Date: 12/03/2018 CLINICAL DATA:  63 year old with acute chest pain and shortness of breath. Current history of diabetes and hypertension. Current history of end-stage renal disease. EXAM: PORTABLE CHEST 1 VIEW COMPARISON:  11/10/2018 and earlier. FINDINGS: Suboptimal inspiration accounts for crowded bronchovascular markings, especially in the bases, and accentuates the cardiac silhouette. Taking this into account, cardiac silhouette moderately enlarged. Pulmonary venous hypertension without overt edema currently. Atelectasis in the LEFT lung base. Small RIGHT pleural effusions suspected with passive atelectasis in the RIGHT LOWER LOBE. Widening of the superior mediastinum, not significantly changed since July, 2020 but increased since February, 2020. IMPRESSION: 1. Suboptimal inspiration accounts for atelectasis in the LEFT lung base. 2. Small RIGHT pleural effusion suspected with passive atelectasis in the RIGHT LOWER LOBE. 3. Stable cardiomegaly without pulmonary edema. 4. Widening of the superior mediastinum, unchanged since July, 2020 but increased since February, 2020. When the patient is clinically stable, CT of the chest with contrast is recommended in further evaluation. Electronically Signed   By: Evangeline Dakin M.D.   On: 12/03/2018 19:54        Scheduled Meds:  Chlorhexidine Gluconate Cloth  6 each Topical Q0600   Chlorhexidine Gluconate Cloth  6 each Topical Q0600   collagenase   Topical Daily   darbepoetin (ARANESP) injection - DIALYSIS  100 mcg Intravenous Q Sat-HD   feeding supplement (PRO-STAT SUGAR  FREE 64)  30 mL Oral BID   heparin  5,000 Units Subcutaneous Q8H   insulin aspart  0-9 Units Subcutaneous Q6H   insulin glargine  20 Units Subcutaneous QHS   [START ON 12/06/2018] levothyroxine  125 mcg Per NG tube Q0600   midodrine  15 mg Per NG tube TID WC   pantoprazole sodium  40 mg Per Tube Daily   sevelamer carbonate  800 mg Per NG tube TID WC   Continuous Infusions:  ceFEPime (MAXIPIME) IV 2 g (12/01/18 1536)   fentaNYL infusion INTRAVENOUS 25 mcg/hr (12/05/18 1102)   vancomycin Stopped (12/03/18 1146)     LOS: 11 days    Time spent: 35 mins.More than 50% of that time was spent in counseling and/or coordination of care.      Shelly Coss, MD [facility] Pager [phone]  If 7PM-7AM, please contact night-coverage [email or web address] Password [email or web address] 12/05/2018, 11:19 AM

## 2018-12-05 NOTE — Anesthesia Procedure Notes (Signed)
Procedure Name: Intubation Date/Time: 12/05/2018 9:34 AM Performed by: Larene Beach, CRNA Pre-anesthesia Checklist: Patient identified, Emergency Drugs available, Suction available and Patient being monitored Patient Re-evaluated:Patient Re-evaluated prior to induction Oxygen Delivery Method: Circle system utilized Preoxygenation: Pre-oxygenation with 100% oxygen Induction Type: IV induction Ventilation: Mask ventilation without difficulty and Two handed mask ventilation required Laryngoscope Size: Glidescope and 4 Grade View: Grade I Tube type: Oral Tube size: 7.5 mm Number of attempts: 1 Airway Equipment and Method: Video-laryngoscopy and Rigid stylet Placement Confirmation: ETT inserted through vocal cords under direct vision,  positive ETCO2,  breath sounds checked- equal and bilateral and CO2 detector Secured at: 23 cm Tube secured with: Tape Dental Injury: Teeth and Oropharynx as per pre-operative assessment

## 2018-12-05 NOTE — Significant Event (Signed)
Rapid Response Event Note  Overview: Time Called: 0909 Arrival Time: 0912 Event Type: Respiratory  Initial Focused Assessment: Patient minimally responsive on bipap  Interventions: Anesthesia at bedside to intubate patient. Patient intubated and transported to [place] (if not transferred):  Event Summary: Name of Physician Notified: Shelly Coss prior to my arrival   Name of Consulting Physician Notified: CCM,  NP at bedside at    Outcome: Transferred (Comment)  Event End Time: [id number]  Raliegh Ip

## 2018-12-05 NOTE — Progress Notes (Signed)
Patient wife and staff stopped me in hall and asked if I would pray for wife and husband. I prayed . The I visited with patient at bedside .Stood over patient and prayed for him.  Chaplain available as needed.  Jaclynn Major, [place], [facility], Pager [phone]

## 2018-12-05 NOTE — Progress Notes (Signed)
MD at bedside. Rapid and respiratory at bedside. Anesthesia team called and intubated patient at bedside. Report called to 34M RN. Patient transferred to ICU. Family informed by primary MD. Patients belongs including cell phone and eye glasses transferred with patient.

## 2018-12-05 NOTE — Consult Note (Addendum)
NAME:  [name], MRN:  [id number], DOB:  07-10-1955, LOS: 7 ADMISSION DATE:  11/11/2018, CONSULTATION DATE:  12/05/2018 REFERRING MD:  Hospitalist CHIEF COMPLAINT:  Respiratory failure   Brief History   63yo M ESRD, Chronic hypoxic resp failure on 4lpm at home since 8/202 and BiPAP HS,PVD s/p L AKA, R femur fx, DM, morbid obesity, HTN now with chronic hypotension on midodrine, HLD admitted 11/10/2018 with infected sacral wound. On 10/8 he became hypotensive post HD, hypoxic and lethargic, RRT called, intubated by anesthesia. CCM consulted for further management.   History of present illness   63yo M ESRD TThS HD, Chronic hypoxic resp failure on 4lpm at home since 8/202 and BiPAP HS, PVD s/p L AKA, R femur fx, DM, morbid obesity, HTN now with chronic hypotension on midodrine, HLD, essentially wheelchair bound, admitted 11/25/2018 with infected sacral wound.Per EMR he developed a small ulcer on sacrum approx 4 weeks prior to admission that progressed to Stage IV. Surgery consulted on admit, underwent debridement of same on 9/28. Since, he has had hydrotherapy, dakins, abx and dressing changes, but has refused some care including BID dressing changes and air mattress. He has been receiving HD per his normal schedule and does not usually tolerate volume removal d/t hypotension. An extra session of HD was ordered for 10/7 to attempt volume removal (reported 800cc removed) which he received early am 10/8. He has been off and on BiPAP/CPAP and supplemental O2 with escalating FiO2 requirements. Palliative care has been consulted with pt and wife reluctant to pursue DNR/DNI. On 10/8 he became hypotensive post HD, hypoxic and lethargic, RRT called, intubated by anesthesia. Wife was contacted by Dr Tawanna Solo prior who requested to continue FULL CODE.  CCM consulted for further management.   Past Medical History  ESRD TThS HD PVD s/p L AKA R femur fx DM Morbid obesity HTN now with chronic hypotension on  midodrine [place] [facility] Events   10/4 RRT for hypoxia 10/6 RRT for SOB and hypoxia  10/8 RRT, ETI  Consults:  Neph Surgery PCCM  Palliative care   Procedures:  10/7: Aspiration of the left hip joint-yields a 3 ml dark serosanguinous fluid. 10/8 ETI  Significant Diagnostic Tests:  9/29 Sacrum and coccyx XR Significantly degraded images of the sacrum and coccyx due to patient body habitus, motion and diffuse bony demineralization. Sacral ulceration extends to the level of the bone however evaluation is markedly limited. Consider contrast-enhanced cross-sectional imaging for further evaluation of the sacral decubitus ulcer.  9/30 CT ABD/Pelvis  IMPRESSION: 1. Portions of the abdomen are excluded from the level of imaging due to patient's size/body habitus. 2. Interval removal of the previously demonstrated pigtail pleural drain with a small residual collection along the posterior border of the right lobe liver measuring up to 1.8 x 3.8 x 2.5 cm in size, with a small amount of peripheral rim enhancement. Findings are concerning for residual abscess or infection. 3. Persistent periureteral stranding and urothelial thickening, similar to slightly increased from prior exams. Correlate with urinalysis to exclude ascending infection. 4. The distal sigmoid protrudes into a bowel containing right inguinal hernia. No resulting obstruction or CT evidence of vascular compromise. 5. New consolidation in the left lower lobe with air bronchograms, could reflect pneumonia or sequela of aspiration. 6. Moderate right and small left pleural effusions with adjacent passive atelectasis. 7. Worsening body wall edema, compatible with anasarca. 8. Aortic Atherosclerosis (ICD10-I70.0).  10/8 CXR  1. Interval endotracheal intubation, tip positioned over  the mid trachea.  2. Low volume examination with extensive, new perihilar heterogeneous airspace opacity of the right lung,  which may reflect asymmetric edema or infection, and an unchanged focal opacity of the peripheral left lower lung.  3. Cardiomegaly, this appearance likely exaggerated by low volume technique.  Micro Data:  SARS Coronavirus 2 negative 10/8 Blood>>  Antimicrobials:  Cefepime 9/28>> Vancomycin 9/27>>   Interim history/subjective:  Intubated on PCU, transferred to [facility] ICU.   Objective   Blood pressure (!) 73/52, pulse 73, temperature (!) 95.2 F (35.1 C), temperature source Rectal, resp. rate 16, height 5\' 5"  (1.651 m), weight 123.3 kg, SpO2 (!) 84 %.        Intake/Output Summary (Last 24 hours) at 12/05/2018 0936 Last data filed at 12/05/2018 0421 Gross per 24 hour  Intake 330 ml  Output 935 ml  Net -605 ml   Filed Weights   12/03/18 1128 12/05/18 0040 12/05/18 0421  Weight: 124.2 kg 124.2 kg 123.3 kg    Examination: General:acutely and chronically ill appearing, morbidly obese  HENT: Normocephalic, PERRL. Moist mucus membranes.  Neck: No JVD. Trachea midline. No thyromegaly, no lymphadenopathy. Large neck girth  CV: RRR. S1S2. No MRG. +1 radial pulses. + bruit/thrill RUE AV fistula. Doppler pulse only on R DP Lungs: BBS present, coarse rhonchi throughout, FNL, symmetrical. Vent supported  ABD: + colostomy with tan drainage, beefy stoma. +BS x4. SNT/ND. Multiple pani.  No masses, guarding or rigidity GU: No Foley. Scrotal edema noted EXT: L AKA. RLE chronic venous stasis changes Skin: Large sacral wound dressing in place. Image reviewed in EMR. Old healed sacral wound also visualized. Otherwise skin pale, cool, dry Neuro: intubated, sedated. He was lethargic and alert to verbal and intermittently followed commands prior to intubation.   Resolved Hospital Problem list   NA   Assessment & Plan:  Acute on chronic hypoxic respiratory failure s/p ETI requiring mechanical ventilation  Plan Continue ventilator support to prevent eminent deterioration and further organ  dysfunction from hypoxemia and hypercarbia.   Patient is at risk for sudden hypoxia, barotrauma and hemodynamic compromise.   Maintain SpO2 greater than or equal to 90%. Head of bed elevated 30 degrees. Plateau pressures less than 30 cm H20.  Follow chest x-ray, ABG.   SAT/SBT as tolerated. Bronchial hygiene. RT/bronchodilator protocol. Advance ETT 1cm  Analgesia/sedation for CPOT and goal RASS 0 to -1   ESRD with chronic hypotension. Poor IV access. WBC elevated. Afebrile.  Plan HD per neph Continue midodrine IV team to attempt to place additional PIV Albumin/crystalloids judiciously  May need CVC Peripheral pressors if needed Check procal. Doubt septic as he has been on abx since admission-continue for now Blood cx Follow fever curve/WBC   Renal panel now  Last echo 2017. Reviewed. Will order updated in setting of ongoing hypotension and probable PH in ESRD. Check BNP.   Stage IV Sacral wound POA Plan Surgery following-care per same   [place] Palliative care is following.  Multiple providers have discussed realistic GOC with pt and wife.  Will f/u with both PC and wife when she arrives.   Best practice:  Diet: NPO. Consult nutrition for TF  Pain/Anxiety/Delirium protocol (if indicated): yes  VAP protocol (if indicated): yes  DVT prophylaxis: heparin GI prophylaxis: PPI  Glucose control: SSI and lantus Mobility: bed rest  Code Status: FULL  Family Communication: will update  Disposition: Admit ICU   Labs   CBC: Recent Labs  Lab 11/30/18 0705 12/01/18 0917 12/03/18  0423 12/04/18 0332 12/05/18 0705  WBC 13.1* 14.8* 19.8* 17.8* 23.2*  NEUTROABS  --   --   --   --  21.1*  HGB 9.8* 8.5* 8.4* 8.5* 8.8*  HCT 34.7* 30.8* 30.5* 30.7* 32.3*  MCV 89.7 89.3 88.9 90.0 92.0  PLT 116* 113* 149* 126* 135*    Basic Metabolic Panel: Recent Labs  Lab 11/29/18 0538 11/30/18 0705 12/01/18 0809 12/02/18 0441 12/02/18 1106 12/03/18 0423 12/04/18 0332 12/05/18 0705   NA 131* 130* 128*  --  130* 128* 131* 135  K 4.6 5.5* 6.1*  --  4.9 5.0 4.3 3.6  CL 95* 94* 94*  --  95* 91* 94* 98  CO2 23 25 21*  --  23 24 26 24   GLUCOSE 132* 113* 89  --  95 149* 146* 123*  BUN 34* 51* 69*  --  39* 48* 33* 20  CREATININE 2.69* 3.27* 3.93*  --  2.73* 3.21* 2.44* 1.84*  CALCIUM 6.9* 7.1* 6.7*  --  6.8* 7.0* 7.7* 8.6*  MG 1.8  --   --  1.8  --   --   --   --   PHOS 3.0 3.4 3.7  --  2.9 3.0 2.7  --    GFR: Estimated Creatinine Clearance: 51.4 mL/min (A) (by C-G formula based on SCr of 1.84 mg/dL (H)). Recent Labs  Lab 12/01/18 0917 12/03/18 0423 12/04/18 0332 12/05/18 0705  WBC 14.8* 19.8* 17.8* 23.2*    Liver Function Tests: Recent Labs  Lab 11/30/18 0705 12/01/18 0809 12/02/18 1106 12/03/18 0423 12/04/18 0332  ALBUMIN 2.1* 1.8* 2.0* 2.0* 2.2*   No results for input(s): LIPASE, AMYLASE in the last 168 hours. No results for input(s): AMMONIA in the last 168 hours.  ABG    Component Value Date/Time   PHART 7.359 12/03/2018 1335   PCO2ART 52.4 (H) 12/03/2018 1335   PO2ART 60.0 (L) 12/03/2018 1335   HCO3 28.8 (H) 12/03/2018 1335   TCO2 21 (L) 11/21/2017 1105   ACIDBASEDEF 4.0 (H) 08/14/2012 1431   O2SAT 90.0 12/03/2018 1335     Coagulation Profile: No results for input(s): INR, PROTIME in the last 168 hours.  Cardiac Enzymes: No results for input(s): CKTOTAL, CKMB, CKMBINDEX, TROPONINI in the last 168 hours.  HbA1C: Hemoglobin A1C  Date/Time Value Ref Range Status  04/29/2015 5.3  Final   Hgb A1c MFr Bld  Date/Time Value Ref Range Status  03/09/2017 06:50 AM 5.1 4.8 - 5.6 % Final    Comment:    (NOTE) Pre diabetes:          5.7%-6.4% Diabetes:              >6.4% Glycemic control for   <7.0% adults with diabetes   03/20/2016 11:31 AM 5.3 4.6 - 6.5 % Final    Comment:    Glycemic Control Guidelines for People with Diabetes:Non Diabetic:  <6%Goal of Therapy: <7%Additional Action Suggested:  >8%     CBG: Recent Labs  Lab 12/04/18  0719 12/04/18 1205 12/04/18 1611 12/04/18 2108 12/05/18 0748  GLUCAP 134* 141* 123* 130* 112*    Review of Systems:   UTO as pt is intubated and sedated   Past Medical History  He,  has a past medical history of Anemia, Anxiety, AR (allergic rhinitis), Arthritis, Blood transfusion, BPH (benign prostatic hypertrophy), Cellulitis of left leg (08/14/2012), Cholelithiasis, Chronic kidney disease, Chronic pain, Diabetes mellitus, Displacement of lumbar intervertebral disc without myelopathy, Diverticulosis of colon, DVT (deep venous thrombosis) (  [place]), Embolism and thrombosis of unspecified artery ([place]), ESRD (end stage renal disease) ([place]), Gallstone, History of nephrolithiasis, Hyperlipidemia, Hypertension, Hypertrophy of prostate, Hypothyroidism, Impotence of organic origin, Morbid obesity ([place]), Nephrolithiasis, Non-pressure chronic ulcer of left calf, limited to breakdown of skin ([place]) (02/07/2016), Osteomyelitis ([place]), Other lymphedema, Pancreatitis, acute, Peripheral vascular disease ([place]), Personal history of arthritis, Pyelonephritis (10/29/2012), Pyogenic arthritis of left knee joint ([place]), S/P BKA (below knee amputation) unilateral ([place]) (01/22/2015), Sleep apnea, Subacute osteomyelitis of left tibia ([place]) (03/05/2017), Traumatic closed nondisplaced fracture of neck of femur, left, sequela (03/05/2017), and Unspecified septicemia(038.9) ([place]).   Surgical History    Past Surgical History:  Procedure Laterality Date  . AMPUTATION  02/07/2011   Procedure: AMPUTATION BELOW KNEE;  Surgeon: Newt Minion, MD;  Location: [facility];  Service: Orthopedics;  Laterality: Left;  Left Below Knee Amputation  . AMPUTATION Left 03/09/2017   Procedure: LEFT ABOVE KNEE AMPUTATION;  Surgeon: Newt Minion, MD;  Location: [facility];  Service: Orthopedics;  Laterality: Left;  . bilteral hip     replacement & revison  's   . CYSTOSCOPY W/ RETROGRADES Bilateral 10/24/2012   Procedure: CYSTOSCOPY WITH RETROGRADE PYELOGRAM  Procedure: Cystoscopy, Bilateral Retrogarde Pyelograms, Bladder Biopsy, Hydrodistension;  Surgeon: Franchot Gallo, MD;  Location: [facility];  Service: Urology;  Laterality: Bilateral;  . KIDNEY SURGERY    . KNEE SURGERY     left knee    . LEG AMPUTATION BELOW KNEE    . REPLACEMENT TOTAL KNEE    . TONSILLECTOMY       Social History   reports that he has never smoked. He has never used smokeless tobacco. He reports that he does not drink alcohol or use drugs.   Family History   His family history includes Arthritis in his paternal grandmother; Colon cancer in his father; Dementia in his father; Diabetes in his mother; Heart attack in his father and mother; Hypertension in an other family member; Hypothyroidism in an other family member; Stroke in his mother.   Allergies No Known Allergies   Home Medications  Prior to Admission medications   Medication Sig Start Date End Date Taking? Authorizing Provider  acetaminophen (TYLENOL) 500 MG tablet Take 500 mg by mouth every 6 (six) hours as needed for moderate pain.   Yes [provider]  atorvastatin (LIPITOR) 40 MG tablet TAKE 1 TABLET (40 MG TOTAL) BY MOUTH DAILY. Patient taking differently: Take 40 mg by mouth daily.  09/13/15  Yes Brunetta Jeans, PA-C  gabapentin (NEURONTIN) 100 MG capsule TAKE 1 CAPSULE (100 MG TOTAL) BY MOUTH 3 (THREE) TIMES DAILY. Patient taking differently: Take 100 mg by mouth 3 (three) times daily.  12/26/15  Yes Brunetta Jeans, PA-C  HYDROcodone-acetaminophen (NORCO/VICODIN) 5-325 MG tablet Take 1 tablet by mouth every 6 (six) hours as needed (pain). 10/21/18  Yes Newt Minion, MD  insulin aspart (NOVOLOG) 100 UNIT/ML injection Inject 3-10 Units as directed 2 (two) times daily. Sliding scale 100-150 3 units 151-200 4 units 201-250 6 units 251-300 8 units 301-350 10 units 09/27/16  Yes [provider]  Insulin Glargine (BASAGLAR KWIKPEN) 100 UNIT/ML SOPN Inject 30 Units into the skin daily.     Yes [provider]  levothyroxine (SYNTHROID) 125 MCG tablet Take by mouth. 07/05/18  Yes [provider]  loperamide (IMODIUM A-D) 2 MG tablet Take 2 mg by mouth 2 (two) times daily.    Yes [provider]  midodrine (PROAMATINE) 5 MG tablet Take  15 mg by mouth 3 (three) times daily with meals.  06/02/16  Yes [provider]  sevelamer carbonate (RENVELA) 800 MG tablet Take 800 mg by mouth 3 (three) times daily with meals.    Yes [provider]  traMADol (ULTRAM) 50 MG tablet Take 50 mg by mouth as needed for pain. 11/08/17  Yes [provider]  levothyroxine (SYNTHROID, LEVOTHROID) 200 MCG tablet Take 1 tablet (200 mcg total) by mouth every morning. Patient not taking: Reported on 10/31/2018 01/10/16   Brunetta Jeans, PA-C      The patient is critically ill with respiratory failure. He requires ICU for high complexity decision making, titration of high alert medications, ventilator management, titration of oxygen and interpretation of advanced monitoring.    I personally spent 60 minutes providing critical care services including personally reviewing test results, discussing care with nursing staff/other physicians and completing orders pertaining to this patient.  Time was exclusive to the patient and does not include time spent teaching or in procedures.  Voice recognition software was used in the production of this record.  Errors in interpretation may have been inadvertently missed during review.  Francine Graven, MSN, AGACNP  Pager [phone] or if no answer [phone] [facility] Pulmonary & Critical Care

## 2018-12-05 NOTE — Progress Notes (Signed)
Responded to palliative spiritual care consult. Pt was resting and in and out in conversation. Wife and family were at bedside. [name] (wife) asked [name] about his care in the event of health change. I noticed he affirmed questions she asked him. Juniper requested prayer by nodding his head. They are strong believers in their faith and stated that they have there [place] praying for them.   I offered spiritual care with words of comfort, Compassionated listening, ministry of presence and prayer. [name] was thankful for the staff care received.   Palliative care Resident  Chaplain Fidel Levy [phone]

## 2018-12-05 NOTE — Progress Notes (Signed)
PCCM INTERVAL PROGRESS NOTE   Called to bedside to evaluate patient in the setting of hypotension. He is chronically hypotensive. Current BP goals are MAP > 60 or SBP > 80. Upon my arrival to bedside IVF bolus had been initiated and SBP in upper 60s. Chart reviewed. He is felt to be volume overloaded, and had one extra HD treatment yesterday. HD withheld today due to decompensation this morning. Have discussed with wife who would want aggressive care and is OK with CVL vs Trialysis cath placement if necessary.   Plan: He has responded somewhat to 500cc bolus, will give one additonal 500cc bolus LR Albumin 5% as I suspect he may be intravasculalry dry.  Norepi as ordered by attending MD for MAP goal 28mmHg.  Should his norepi requirement be more than 5-39mcg will place trialysis catheter.    Georgann Housekeeper, AGACNP-BC [place] Pager [phone] or [phone]  12/05/2018 5:15 PM

## 2018-12-05 NOTE — Progress Notes (Signed)
PT Cancellation Note  Patient Details Name: [name] MRN: [id number] DOB: 1956-01-21   Cancelled Treatment:    Reason Eval/Treat Not Completed: Checked on patient x2 this date. Upon first check-in pt had just moved to 56M and getting IV placed. Discussed with RN to check back after lunch for appropriateness for hydrotherapy. Upon second check-in, pt off unit for CT. Will likely not be able to return again this afternoon but will attempt hydrotherapy again tomorrow.    Thelma Comp 12/05/2018, 1:20 PM   Rolinda Roan, PT, DPT Acute Rehabilitation Services Pager: [phone] Office: [phone]

## 2018-12-05 NOTE — Progress Notes (Signed)
[facility] Progress Note   Subjective: Had extra HD yesterday and became hypotensive, lethargic, hypoxic a couple hours afterward.  Intubated in the room and went to ICU.  Getting TTE now.     Objective Vitals:   12/05/18 0844 12/05/18 0900 12/05/18 0914 12/05/18 0958  BP:  (!) 70/51 (!) 73/52 122/79  Pulse:  78 73 73  Resp:  15 16 16   Temp:      TempSrc:      SpO2: 90% (!) 86% (!) 84%   Weight:      Height:       Physical Exam General: Obese, chronically ill appearing male, intubated Heart: S1,S2 RRR Lungs: mechanical breath sounds bilaterally Abdomen: Active BS, obese, NT, subcutaneous bruising  Extremities: L AKA with 1+ stump edema. LLE 1+ edema. Dialysis Access: R AVF + bruit, aneurysmal SKIN: large stage IV sacral decub pics reviewed in Epic    Additional Objective Labs: Basic Metabolic Panel: Recent Labs  Lab 12/02/18 1106 12/03/18 0423 12/04/18 0332 12/05/18 0705  NA 130* 128* 131* 135  K 4.9 5.0 4.3 3.6  CL 95* 91* 94* 98  CO2 23 24 26 24   GLUCOSE 95 149* 146* 123*  BUN 39* 48* 33* 20  CREATININE 2.73* 3.21* 2.44* 1.84*  CALCIUM 6.8* 7.0* 7.7* 8.6*  PHOS 2.9 3.0 2.7  --    Liver Function Tests: Recent Labs  Lab 12/02/18 1106 12/03/18 0423 12/04/18 0332  ALBUMIN 2.0* 2.0* 2.2*   No results for input(s): LIPASE, AMYLASE in the last 168 hours. CBC: Recent Labs  Lab 11/30/18 0705 12/01/18 0917 12/03/18 0423 12/04/18 0332 12/05/18 0705  WBC 13.1* 14.8* 19.8* 17.8* 23.2*  NEUTROABS  --   --   --   --  21.1*  HGB 9.8* 8.5* 8.4* 8.5* 8.8*  HCT 34.7* 30.8* 30.5* 30.7* 32.3*  MCV 89.7 89.3 88.9 90.0 92.0  PLT 116* 113* 149* 126* 135*   Blood Culture    Component Value Date/Time   SDES BLOOD LEFT ARM 12/28/2016 0305   SPECREQUEST IN PEDIATRIC BOTTLE Blood Culture adequate volume 12/28/2016 0305   CULT NO GROWTH 5 DAYS 12/28/2016 0305   REPTSTATUS 01/02/2017 FINAL 12/28/2016 0305    Cardiac Enzymes: No results for  input(s): CKTOTAL, CKMB, CKMBINDEX, TROPONINI in the last 168 hours. CBG: Recent Labs  Lab 12/04/18 1205 12/04/18 1611 12/04/18 2108 12/05/18 0748 12/05/18 0939  GLUCAP 141* 123* 130* 112* 122*   Iron Studies: No results for input(s): IRON, TIBC, TRANSFERRIN, FERRITIN in the last 72 hours. @lablastinr3 @ Studies/Results: Dg Chest 1 View  Result Date: 12/05/2018 CLINICAL DATA:  Shortness of breath, intubation EXAM: CHEST  1 VIEW COMPARISON:  12/03/2018 FINDINGS: Interval endotracheal intubation, tip positioned over the mid trachea. Low volume examination with extensive, new perihilar heterogeneous airspace opacity of the right lung and an unchanged focal opacity of the peripheral left lower lung. Cardiomegaly, this appearance likely exaggerated by low volume technique. IMPRESSION: 1. Interval endotracheal intubation, tip positioned over the mid trachea. 2. Low volume examination with extensive, new perihilar heterogeneous airspace opacity of the right lung, which may reflect asymmetric edema or infection, and an unchanged focal opacity of the peripheral left lower lung. 3. Cardiomegaly, this appearance likely exaggerated by low volume technique. Electronically Signed   By: Eddie Candle M.D.   On: 12/05/2018 10:10   Dg Chest Port 1 View  Result Date: 12/03/2018 CLINICAL DATA:  63 year old with acute chest pain and shortness of breath. Current history of diabetes and  hypertension. Current history of end-stage renal disease. EXAM: PORTABLE CHEST 1 VIEW COMPARISON:  11/05/2018 and earlier. FINDINGS: Suboptimal inspiration accounts for crowded bronchovascular markings, especially in the bases, and accentuates the cardiac silhouette. Taking this into account, cardiac silhouette moderately enlarged. Pulmonary venous hypertension without overt edema currently. Atelectasis in the LEFT lung base. Small RIGHT pleural effusions suspected with passive atelectasis in the RIGHT LOWER LOBE. Widening of the  superior mediastinum, not significantly changed since July, 2020 but increased since February, 2020. IMPRESSION: 1. Suboptimal inspiration accounts for atelectasis in the LEFT lung base. 2. Small RIGHT pleural effusion suspected with passive atelectasis in the RIGHT LOWER LOBE. 3. Stable cardiomegaly without pulmonary edema. 4. Widening of the superior mediastinum, unchanged since July, 2020 but increased since February, 2020. When the patient is clinically stable, CT of the chest with contrast is recommended in further evaluation. Electronically Signed   By: Evangeline Dakin M.D.   On: 12/03/2018 19:54   Medications: . ceFEPime (MAXIPIME) IV 2 g (12/01/18 1536)  . fentaNYL infusion INTRAVENOUS 25 mcg/hr (12/05/18 1102)  . vancomycin Stopped (12/03/18 1146)   . Chlorhexidine Gluconate Cloth  6 each Topical Q0600  . Chlorhexidine Gluconate Cloth  6 each Topical Q0600  . collagenase   Topical Daily  . darbepoetin (ARANESP) injection - DIALYSIS  100 mcg Intravenous Q Sat-HD  . feeding supplement (PRO-STAT SUGAR FREE 64)  30 mL Oral BID  . heparin  5,000 Units Subcutaneous Q8H  . insulin aspart  0-9 Units Subcutaneous Q6H  . insulin glargine  20 Units Subcutaneous QHS  . [START ON 12/06/2018] levothyroxine  125 mcg Per NG tube Q0600  . midodrine  15 mg Per NG tube TID WC  . pantoprazole sodium  40 mg Per Tube Daily  . sevelamer carbonate  800 mg Per NG tube TID WC     Dialysis Orders: [facility] [address] [facility]  3h 58min 113kg (250 lbs) 2/2.5 bath  R AVF -No Heparin  -Mircera 200 mcg IV q 2 weeks last dose 8/27  Assessment/Plan:  1. Acute hypoxic RF: intubated in the room.  PCCM on the case now.  Poor prognosis.   2. Stage IV sacral decubitus ulcer - Limited mobility. Mostly wheelchair/bedbound. S/P bedside debridement per surgery 9/28. Hydrotherapy, Vanc, cefepime - per primary.Per PMD notes has been at [facility] in the past. 3. Abdominal Pain: CT shows small residual  collection posterion schedule. r border of lived concerning for abscess. On Vanc/Cefepime.  4. ESRD -HD TTS. Continue on schedule. Had HD 10/06.  Low temp, got albumin.  Na 128 which notes vol overload, extra rx 10/7.  He was supposed to get HD again today 10/8--> given turn of events I will hold dialytic intervention right now until he can get stabilized/ further discussions had with family.   5. Hypotension/volume: On midodrine for BP support.Has BLE edema. HD 10/04 Pre wt 122 Net UF only 434 Post wt 121.7. Very much above OP EDW. SBP in 80s during HD. Continue Volume removal as tolerated with SBP > 85 6. Anemia - Hgb 8.5 Rec'd Aranesp 100 mcg IV 10/04. Follow HGB.  7. Metabolic bone disease -Phos/ C Ca at goal.  No Vit D as outpatient. Continue binders. 8. Nutrition -Albumin 1.9. Prot supp for low albumin  9. DM -insulin per primary  10. S/p cystectomy with urostomy 11. Dispo: possible LTACH--> He is declining and his prognosis is extremely poor.  He has hypotension and volume overload which puts him in a  difficult position for dialysis.  He is a very poor candidate for CRRT and intermittent dialysis on a pressor would possibly fix the volume problem transiently but wouldn't translate to the outpatient setting.  The other option would be 4x a week dialysis which would negatively impact his QOL.  He has constant anxiety and SOB.  Palliative is following intermittently- but now that he's not able to achieve good UF with 3x weekly dialysis and now with intubation we need a regroup.  I appreciate their assistance.      Madelon Lips MD 12/05/2018, 11:19 AM  [facility] [phone]

## 2018-12-05 NOTE — Transfer of Care (Signed)
Immediate Anesthesia Transfer of Care Note  Patient: [name]  Procedure(s) Performed: AN AD HOC INTUBATION  Patient Location: [address]  Anesthesia Type:Intubation  Level of Consciousness: sedated and Patient remains intubated per anesthesia plan  Airway & Oxygen Therapy: Patient remains intubated per anesthesia plan and Respiratory ventilating the patient via AMBU bag 100% O2 for transfer to ICU  Post-op Assessment: Report given to RN and Post -op Vital signs reviewed and stable  Post vital signs: Reviewed and stable  Last Vitals:  Vitals Value Taken Time  BP 122/79 12/05/18 0953  Temp    Pulse 81 12/05/18 0956  Resp 18 12/05/18 0956  SpO2 91 % 12/05/18 0956  Vitals shown include unvalidated device data.  Last Pain:  Vitals:   12/05/18 0841  TempSrc: Rectal  PainSc:       Patients Stated Pain Goal: 2 ([id number])  Complications: No apparent anesthesia complications

## 2018-12-05 NOTE — Progress Notes (Signed)
HD was called to ask for labs to be drawn; HD stated that the fistula was already closed. Phlebotomist for this zone was called and notified that they needed to draw his labs.

## 2018-12-05 NOTE — Progress Notes (Signed)
Note-pt has h/o cystectomy with urostomy-not a colostomy, as inadvertently documented on exam.   Francine Graven, MSN, AGACNP  [facility]

## 2018-12-05 NOTE — Progress Notes (Signed)
Orders from [facility] NP to keep MAP > 60 or SBP > 80. Patient began having readings of 60's/40's with MAP ~55.   I paged Dr. Chase Caller and notified him of this. Orders received to give 549mL bolus of 0.9% and start norepinephrine drip. Hoffman NP to bedside to evaluate  [name], BSN, RN 6101M/101M MICU

## 2018-12-05 NOTE — Progress Notes (Addendum)
Initial Nutrition Assessment  DOCUMENTATION CODES:   Morbid obesity  INTERVENTION:    Vital 1.5 at 30 ml/h (720 ml per day)   Pro-stat 60 ml TID   Provides 1680 kcal, 139 gm protein, 550 ml free water daily  NUTRITION DIAGNOSIS:   Increased nutrient needs related to chronic illness, wound healing(ESRD on HD) as evidenced by estimated needs.  GOAL:   Provide needs based on ASPEN/SCCM guidelines  MONITOR:   Vent status, TF tolerance, Labs, Skin, I & O's  REASON FOR ASSESSMENT:   Ventilator, Consult Enteral/tube feeding initiation and management  ASSESSMENT:   63 yo male admitted with infected sacral ulcer on 9/27; S/P debridement on 9/28. Patient became hypoxic, hypothermic, lethargic, and hypotensive after HD on 10/8, requiring intubation and transfer to the ICU. PMH includes ESRD-HD, PVD s/p L AKA, R femur fx, DM-2, morbid obesity, HLD, HTN.   Prior to today, patient was on a CHO modified diet, consuming 80-100% of meals. Poor intake documented by RN yesterday.  Received MD Consult for TF initiation and management. Abdominal x-ray results pending for NG tube placement.  Patient is currently intubated on ventilator support MV: 8.2 L/min Temp (24hrs), Avg:97.7 F (36.5 C), Min:95.2 F (35.1 C), Max:98.8 F (37.1 C)   Labs reviewed. Creatinine 1.84 (H) CBG's: 112-122  Medications reviewed and include novolog, lantus, renvela, synthroid.  I/O +1.9 L  Per Nephrology, patient is a poor CRRT candidate. Progressive inability to achieve good UF with HD 3x/week as an outpatient. Had HDx2 yesterday.   Palliative Care team is following. Wife wants to continue full support at this time, despite poor prognosis.   NUTRITION - FOCUSED PHYSICAL EXAM:  unable to complete  Diet Order:   Diet Order            Diet NPO time specified  Diet effective now              EDUCATION NEEDS:   Not appropriate for education at this time  Skin:  Skin Assessment: Skin  Integrity Issues: Skin Integrity Issues:: Stage IV Stage IV: sacrum  Last BM:  10/7  Height:   Ht Readings from Last 1 Encounters:  11/30/18 5\' 5"  (1.651 m)    Weight:   Wt Readings from Last 1 Encounters:  12/05/18 123.3 kg    Ideal Body Weight:  56.9 kg  BMI:  Adjusted body mass index is 49.2 kg/m.  Estimated Nutritional Needs:   Kcal:  [id number]  Protein:  120-140 gm  Fluid:  1 L + UOP    Molli Barrows, RD, LDN, [place] Pager [phone] After Hours Pager [phone]

## 2018-12-05 NOTE — Progress Notes (Addendum)
Received in report that pt barely ate during AM shift. Pt ate a few bites of chicken and stated that he couldn't but he tried. Pt experienced severe pain with patient care; see eMar for administration. Pt lethargic but interactive - prior to HD, pt not as interactive. RT was called during this shift as pt would desat - pt not perfusing well, mottling and edema in extremities - o2 sensor placed on forehead, and sats were in mid to high 90's. RT had placed pt on CPAP after return from HD. Pt was very responsive and vocal during sacral wound dressing change at end of shift, but otherwise fatigued and not interactive.

## 2018-12-05 NOTE — Progress Notes (Signed)
Called by CCMD that leads were off and O2 in the 30's. RN responded to see patient pulled HFNC off and leads as well. O2 was 26% and HR was in the 40's. Alder put back on along with telemetry leads placed back. HFNC bumped up to 10 to allow him to recover and vitals stabilize. New vitals-see flowsheet. Will inform MD, and respiratory, rapid response. RN will  Monitor closely.

## 2018-12-05 NOTE — Progress Notes (Signed)
  Echocardiogram 2D Echocardiogram has been performed.  Orvil Faraone G Price [place] 12/05/2018, 11:10 AM

## 2018-12-06 ENCOUNTER — Inpatient Hospital Stay (HOSPITAL_COMMUNITY): Payer: Medicare Other

## 2018-12-06 ENCOUNTER — Other Ambulatory Visit (HOSPITAL_COMMUNITY): Payer: Medicare Other

## 2018-12-06 DIAGNOSIS — Z515 Encounter for palliative care: Secondary | ICD-10-CM

## 2018-12-06 LAB — RENAL FUNCTION PANEL
Albumin: 2.3 g/dL — ABNORMAL LOW (ref 3.5–5.0)
Albumin: 2.5 g/dL — ABNORMAL LOW (ref 3.5–5.0)
Anion gap: 11 (ref 5–15)
Anion gap: 14 (ref 5–15)
BUN: 29 mg/dL — ABNORMAL HIGH (ref 8–23)
BUN: 32 mg/dL — ABNORMAL HIGH (ref 8–23)
CO2: 24 mmol/L (ref 22–32)
CO2: 20 mmol/L — ABNORMAL LOW (ref 22–32)
Calcium: 8.1 mg/dL — ABNORMAL LOW (ref 8.9–10.3)
Calcium: 8 mg/dL — ABNORMAL LOW (ref 8.9–10.3)
Chloride: 100 mmol/L (ref 98–111)
Chloride: 101 mmol/L (ref 98–111)
Creatinine, Ser: 2.43 mg/dL — ABNORMAL HIGH (ref 0.61–1.24)
Creatinine, Ser: 2.44 mg/dL — ABNORMAL HIGH (ref 0.61–1.24)
GFR calc Af Amer: 32 mL/min — ABNORMAL LOW (ref >60)
GFR calc Af Amer: 32 mL/min — ABNORMAL LOW (ref >60)
GFR calc non Af Amer: 28 mL/min — ABNORMAL LOW (ref >60)
GFR calc non Af Amer: 28 mL/min — ABNORMAL LOW (ref >60)
Glucose, Bld: 139 mg/dL — ABNORMAL HIGH (ref 70–99)
Glucose, Bld: 191 mg/dL — ABNORMAL HIGH (ref 70–99)
Phosphorus: 1.3 mg/dL — ABNORMAL LOW (ref 2.5–4.6)
Phosphorus: 1.8 mg/dL — ABNORMAL LOW (ref 2.5–4.6)
Potassium: 3.7 mmol/L (ref 3.5–5.1)
Potassium: 4 mmol/L (ref 3.5–5.1)
Sodium: 135 mmol/L (ref 135–145)
Sodium: 135 mmol/L (ref 135–145)

## 2018-12-06 LAB — CBC
HCT: 28 % — ABNORMAL LOW (ref 39.0–52.0)
HCT: 28.3 % — ABNORMAL LOW (ref 39.0–52.0)
Hemoglobin: 7.7 g/dL — ABNORMAL LOW (ref 13.0–17.0)
Hemoglobin: 8.1 g/dL — ABNORMAL LOW (ref 13.0–17.0)
MCH: 24.9 pg — ABNORMAL LOW (ref 26.0–34.0)
MCH: 25.4 pg — ABNORMAL LOW (ref 26.0–34.0)
MCHC: 27.5 g/dL — ABNORMAL LOW (ref 30.0–36.0)
MCHC: 28.6 g/dL — ABNORMAL LOW (ref 30.0–36.0)
MCV: 90.6 fL (ref 80.0–100.0)
MCV: 88.7 fL (ref 80.0–100.0)
Platelets: 148 10*3/uL — ABNORMAL LOW (ref 150–400)
Platelets: 141 10*3/uL — ABNORMAL LOW (ref 150–400)
RBC: 3.09 MIL/uL — ABNORMAL LOW (ref 4.22–5.81)
RBC: 3.19 MIL/uL — ABNORMAL LOW (ref 4.22–5.81)
RDW: 18 % — ABNORMAL HIGH (ref 11.5–15.5)
RDW: 18 % — ABNORMAL HIGH (ref 11.5–15.5)
WBC: 20.6 10*3/uL — ABNORMAL HIGH (ref 4.0–10.5)
WBC: 19.3 10*3/uL — ABNORMAL HIGH (ref 4.0–10.5)
nRBC: 1 % — ABNORMAL HIGH (ref 0.0–0.2)
nRBC: 0.7 % — ABNORMAL HIGH (ref 0.0–0.2)

## 2018-12-06 LAB — GRAM STAIN

## 2018-12-06 LAB — GLUCOSE, CAPILLARY
Glucose-Capillary: 150 mg/dL — ABNORMAL HIGH (ref 70–99)
Glucose-Capillary: 157 mg/dL — ABNORMAL HIGH (ref 70–99)
Glucose-Capillary: 158 mg/dL — ABNORMAL HIGH (ref 70–99)
Glucose-Capillary: 160 mg/dL — ABNORMAL HIGH (ref 70–99)
Glucose-Capillary: 170 mg/dL — ABNORMAL HIGH (ref 70–99)
Glucose-Capillary: 188 mg/dL — ABNORMAL HIGH (ref 70–99)

## 2018-12-06 LAB — BODY FLUID CELL COUNT WITH DIFFERENTIAL
Eos, Fluid: 0 %
Lymphs, Fluid: 19 %
Monocyte-Macrophage-Serous Fluid: 50 % (ref 50–90)
Neutrophil Count, Fluid: 31 % — ABNORMAL HIGH (ref 0–25)
Other Cells, Fluid: 0 %
Total Nucleated Cell Count, Fluid: 318 cu mm (ref 0–1000)

## 2018-12-06 LAB — PROTEIN, PLEURAL OR PERITONEAL FLUID: Total protein, fluid: 3 g/dL

## 2018-12-06 LAB — GLUCOSE, PLEURAL OR PERITONEAL FLUID: Glucose, Fluid: 198 mg/dL

## 2018-12-06 LAB — ALBUMIN, PLEURAL OR PERITONEAL FLUID: Albumin, Fluid: 1.4 g/dL

## 2018-12-06 LAB — PROCALCITONIN: Procalcitonin: 5.53 ng/mL

## 2018-12-06 LAB — AMYLASE, PLEURAL OR PERITONEAL FLUID: Amylase, Fluid: 21 U/L

## 2018-12-06 LAB — LACTATE DEHYDROGENASE, PLEURAL OR PERITONEAL FLUID: LD, Fluid: 65 U/L — ABNORMAL HIGH (ref 3–23)

## 2018-12-06 MED ORDER — PRISMASOL BGK 4/2.5 32-4-2.5 MEQ/L REPLACEMENT SOLN
Status: DC
  Administered 2018-12-06 – 2018-12-11 (×5): via INTRAVENOUS_CENTRAL
  Filled 2018-12-06 (×7): qty 5000

## 2018-12-06 MED ORDER — VANCOMYCIN HCL IN DEXTROSE 1-5 GM/200ML-% IV SOLN
1000.0000 mg | Freq: Every day | INTRAVENOUS | Status: DC
  Administered 2018-12-07: 13:00:00 1000 mg via INTRAVENOUS
  Filled 2018-12-06 (×2): qty 200

## 2018-12-06 MED ORDER — SODIUM PHOSPHATES 45 MMOLE/15ML IV SOLN
20.0000 mmol | Freq: Once | INTRAVENOUS | Status: AC
  Administered 2018-12-06: 20 mmol via INTRAVENOUS
  Filled 2018-12-06: qty 6.67

## 2018-12-06 MED ORDER — CHLORHEXIDINE GLUCONATE CLOTH 2 % EX PADS: 6.0000 | MEDICATED_PAD | Freq: Every day | CUTANEOUS | Status: DC

## 2018-12-06 MED ORDER — MIDAZOLAM HCL 2 MG/2ML IJ SOLN
INTRAMUSCULAR | Status: AC
  Filled 2018-12-06: qty 2

## 2018-12-06 MED ORDER — MIDAZOLAM HCL 2 MG/2ML IJ SOLN
2.0000 mg | Freq: Once | INTRAMUSCULAR | Status: AC
  Administered 2018-12-06: 2 mg via INTRAVENOUS

## 2018-12-06 MED ORDER — DARBEPOETIN ALFA 200 MCG/0.4ML IJ SOSY
200.0000 ug | PREFILLED_SYRINGE | INTRAMUSCULAR | Status: DC
  Administered 2018-12-07: 13:00:00 200 ug via INTRAVENOUS
  Filled 2018-12-06: qty 0.4

## 2018-12-06 MED ORDER — CHLORHEXIDINE GLUCONATE CLOTH 2 % EX PADS
6.0000 | MEDICATED_PAD | Freq: Every day | CUTANEOUS | Status: DC
  Administered 2018-12-06 – 2018-12-12 (×7): 6 via TOPICAL

## 2018-12-06 MED ORDER — SODIUM CHLORIDE 0.9 % IV SOLN
2.0000 g | Freq: Two times a day (BID) | INTRAVENOUS | Status: DC
  Administered 2018-12-06 – 2018-12-10 (×8): 2 g via INTRAVENOUS
  Filled 2018-12-06 (×8): qty 2

## 2018-12-06 MED ORDER — LIDOCAINE HCL (PF) 1 % IJ SOLN
INTRAMUSCULAR | Status: AC
  Administered 2018-12-06: 16:00:00
  Filled 2018-12-06: qty 30

## 2018-12-06 MED ORDER — HEPARIN SODIUM (PORCINE) 1000 UNIT/ML DIALYSIS
1000.0000 [IU] | INTRAMUSCULAR | Status: DC | PRN
  Filled 2018-12-06: qty 6

## 2018-12-06 MED ORDER — PRISMASOL BGK 4/2.5 32-4-2.5 MEQ/L IV SOLN
INTRAVENOUS | Status: DC
  Administered 2018-12-06 – 2018-12-11 (×38): via INTRAVENOUS_CENTRAL
  Filled 2018-12-06 (×56): qty 5000

## 2018-12-06 MED ORDER — SODIUM CHLORIDE 0.9 % FOR CRRT
INTRAVENOUS_CENTRAL | Status: DC | PRN
  Filled 2018-12-06 (×2): qty 1000

## 2018-12-06 MED ORDER — PRISMASOL BGK 4/2.5 32-4-2.5 MEQ/L REPLACEMENT SOLN
Status: DC
  Administered 2018-12-06 – 2018-12-11 (×11): via INTRAVENOUS_CENTRAL
  Filled 2018-12-06 (×15): qty 5000

## 2018-12-06 NOTE — Progress Notes (Signed)
Physical Therapy Wound Treatment Patient Details  Name: [name] MRN: [id number] Date of Birth: [birth year]  Today's Date: 12/06/2018 Time: 1315-1420 Time Calculation (min): 65 min  Subjective  Subjective: Pt remains anxious. Asking for water and upset when RN told him he could not drink anything while on the vent Patient and Family Stated Goals: None stated.  Date of Onset: (Unknown) Prior Treatments: Bedside debridement 9/28  Pain Score:  Pt did not appear to be in significant pain during treatment. Per RN was on Fentanyl drip.  Wound Assessment  Wound / Incision (Open or Dehisced) 11/25/18 Other (Comment) Sacrum Mid (Active)  Wound Image   12/06/18 1440  Dressing Type ABD;Moist to dry;Gauze (Comment);Barrier Film (skin prep) 12/06/18 1440  Dressing Changed Changed 12/06/18 1440  Dressing Status Clean;Dry;Intact 12/06/18 1440  Dressing Change Frequency Daily 12/06/18 1440  Site / Wound Assessment Pink;Red;Yellow;Brown 12/06/18 1440  % Wound base Red or Granulating 30% 12/06/18 1440  % Wound base Yellow/Fibrinous Exudate 50% 12/06/18 1440  % Wound base Black/Eschar 20% 12/06/18 1440  % Wound base Other/Granulation Tissue (Comment) 0% 12/06/18 1440  Peri-wound Assessment Intact;Pink;Induration 12/06/18 1440  Wound Length (cm) 15 cm 12/04/18 1119  Wound Width (cm) 12 cm 12/04/18 1119  Wound Depth (cm) 5 cm 12/05/18 0600  Wound Volume (cm^3) 756 cm^3 12/04/18 1119  Wound Surface Area (cm^2) 180 cm^2 12/04/18 1119  Undermining (cm) 2.8 cm from about 6:00-12:00 11/26/18 1000  Margins Unattached edges (unapproximated) 12/06/18 1440  Closure None 12/06/18 1440  Drainage Amount Copious (Unsure of amount as pad and bed were soaked with drainage. Unsure of timeline as pt did not allow dressing change yesterday per chart review) 12/06/18 1440  Drainage Description Purulent;Odor;Green;Serosanguineous 12/06/18 1440  Treatment Debridement (Selective);Hydrotherapy (Pulse lavage);Packing  (Saline gauze) 12/06/18 1440   Dakin's Solution applied to gauze prior to dressing placement.    Hydrotherapy Pulsed lavage therapy - wound location: Sacrum Pulsed Lavage with Suction (psi): 12 psi(8-12) Pulsed Lavage with Suction - Normal Saline Used: 1000 mL Pulsed Lavage Tip: Tip with splash shield Selective Debridement Selective Debridement - Location: Sacrum Selective Debridement - Tools Used: Forceps;Scalpel;Scissors Selective Debridement - Tissue Removed: yellow and brown necrotic tissue   Wound Assessment and Plan  Wound Therapy - Assess/Plan/Recommendations Wound Therapy - Clinical Statement: Progressing with debridement. Janett Billow, PA present to assess wound with PT. Will continue to follow for selective removal of unviable tissue, to decrease bioburden and promote wound bed healing.  Wound Therapy - Functional Problem List: Decreased mobility, AKA, global weakness.  Factors Delaying/Impairing Wound Healing: Diabetes Mellitus;Immobility;Multiple medical problems Hydrotherapy Plan: Debridement;Dressing change;Patient/family education;Pulsatile lavage with suction Wound Therapy - Frequency: 6X / week Wound Therapy - Follow Up Recommendations: Skilled nursing facility Wound Plan: See above  Wound Therapy Goals- Improve the function of patient's integumentary system by progressing the wound(s) through the phases of wound healing (inflammation - proliferation - remodeling) by: Decrease Necrotic Tissue to: 20% Decrease Necrotic Tissue - Progress: Progressing toward goal Increase Granulation Tissue to: 80% Increase Granulation Tissue - Progress: Progressing toward goal Goals/treatment plan/discharge plan were made with and agreed upon by patient/family: Yes Time For Goal Achievement: 7 days Wound Therapy - Potential for Goals: Fair  Goals will be updated until maximal potential achieved or discharge criteria met.  Discharge criteria: when goals achieved, discharge from hospital, MD  decision/surgical intervention, no progress towards goals, refusal/missing three consecutive treatments without notification or medical reason.  GP     Thelma Comp 12/06/2018, 2:56 PM  Rolinda Roan, PT, DPT Acute Rehabilitation Services Pager: [phone] Office: [phone]

## 2018-12-06 NOTE — Progress Notes (Signed)
Procedure finished, RT requested pt be placed back on vent wean. Pt on vent wean and comfortable. RT will continue to monitor.

## 2018-12-06 NOTE — Procedures (Signed)
PROCEDURE SUMMARY:  Successful US guided right thoracentesis. Yielded 580 mL of clear yellow fluid. Pt tolerated procedure well. No immediate complications.  Specimen was sent for labs. CXR ordered.  EBL < 5 mL  Ascencion Dike PA-C 12/06/2018 4:18 PM

## 2018-12-06 NOTE — Progress Notes (Signed)
ATTESTATION & SIGNATURE   STAFF NOTE: I, Dr Ann Lions have personally reviewed patient's available data, including medical history, events of note, physical examination and test results as part of my evaluation. I have discussed with resident/NP and other care providers such as pharmacist, RN and RRT.  In addition,  I personally evaluated patient and elicited key findings of   S: Date of admit 11/02/2018 with LOS 12 for today 12/06/2018 : [name] is  - remains on ventilator. Per RN got tachypenic and tachycardic. On 40% fio2. Per RN - also finds changing position  Painful due to sacral decub and refuses. Wife insisting on full code - Dr Hollie Salk of renal says CRRT unlikely to benefit but at this poitn will have to offer to help patient go into negative balance and help extubate. CTA with R > L pleural effuson and LLL consolidation - on abx. PE negative.   On levophed. Per Dr Hollie Salk sbp > 90 and MAP > 60 is adequate  Low phos being repleted  O:  Blood pressure (!) 96/55, pulse (!) 44, temperature 99.9 F (37.7 C), temperature source Axillary, resp. rate 18, height 5\' 5"  (1.651 m), weight 123 kg, SpO2 99 %.   Obese On fent gtt, RASS -1 Left AKA Sync with vent    LABS    PULMONARY Recent Labs  Lab 12/03/18 1335 12/05/18 1156  PHART 7.359 7.322*  PCO2ART 52.4* 57.4*  PO2ART 60.0* 107.0  HCO3 28.8* 30.2*  TCO2  --  32  O2SAT 90.0 98.0    CBC Recent Labs  Lab 12/04/18 0332 12/05/18 0705 12/05/18 1156 12/06/18 0252  HGB 8.5* 8.8* 10.2* 7.7*  HCT 30.7* 32.3* 30.0* 28.0*  WBC 17.8* 23.2*  --  20.6*  PLT 126* 135*  --  148*    COAGULATION No results for input(s): INR in the last 168 hours.  CARDIAC  No results for input(s): TROPONINI in the last 168 hours. No results for input(s): PROBNP in the last 168 hours.   CHEMISTRY Recent Labs  Lab 12/02/18 0441 12/02/18 1106 12/03/18 0423 12/04/18 0332 12/05/18 0705 12/05/18 1118 12/05/18 1156 12/06/18 0252   NA  --  130* 128* 131* 135 134* 136 135  K  --  4.9 5.0 4.3 3.6 4.7 3.9 3.7  CL  --  95* 91* 94* 98 99  --  100  CO2  --  23 24 26 24 27   --  24  GLUCOSE  --  95 149* 146* 123* 113*  --  139*  BUN  --  39* 48* 33* 20 21  --  29*  CREATININE  --  2.73* 3.21* 2.44* 1.84* 2.00*  --  2.43*  CALCIUM  --  6.8* 7.0* 7.7* 8.6* 8.3*  --  8.1*  MG 1.8  --   --   --   --  1.9  --   --   PHOS  --  2.9 3.0 2.7  --  2.9  --  1.3*   Estimated Creatinine Clearance: 38.9 mL/min (A) (by C-G formula based on SCr of 2.43 mg/dL (H)).   LIVER Recent Labs  Lab 12/02/18 1106 12/03/18 0423 12/04/18 0332 12/05/18 1118 12/06/18 0252  ALBUMIN 2.0* 2.0* 2.2* 2.6* 2.3*     INFECTIOUS Recent Labs  Lab 12/05/18 1118 12/06/18 0252  PROCALCITON 5.17 5.53     ENDOCRINE CBG (last 3)  Recent Labs    12/05/18 2337 12/06/18 0353 12/06/18 0706  GLUCAP 151* 158* 157*  IMAGING x24h  - image(s) personally visualized  -   highlighted in bold Dg Abd 1 View  Result Date: 12/05/2018 CLINICAL DATA:  NG tube placement. EXAM: ABDOMEN - 1 VIEW COMPARISON:  CT scan 11/27/2018 FINDINGS: There is an NG tube coursing down the esophagus and into the stomach. The tip is in the body region. The endotracheal tube is in good position, 3.7 cm above the carina. Persistent bilateral infiltrates. IMPRESSION: NG tube tip is in the body region of the stomach. Electronically Signed   By: Marijo Sanes M.D.   On: 12/05/2018 12:49   Ct Angio Chest Pe W Or Wo Contrast  Result Date: 12/05/2018 CLINICAL DATA:  63 year old male with recent cardiac arrest. Dialysis. EXAM: CT ANGIOGRAPHY CHEST WITH CONTRAST TECHNIQUE: Multidetector CT imaging of the chest was performed using the standard protocol during bolus administration of intravenous contrast. Multiplanar CT image reconstructions and MIPs were obtained to evaluate the vascular anatomy. CONTRAST:  39mL OMNIPAQUE IOHEXOL 350 MG/ML SOLN COMPARISON:  Portable chest today.  CTA chest 11/15/2007. CT Abdomen and Pelvis 11/27/2018. FINDINGS: Cardiovascular: Good contrast bolus timing in the pulmonary arterial tree. No focal filling defect identified in the pulmonary arteries to suggest acute pulmonary embolism. Calcified coronary artery atherosclerosis. Little contrast in the aorta today. Mild cardiomegaly without pericardial effusion. Mediastinum/Nodes: Enteric tube courses in the esophagus to the abdomen. No lymphadenopathy. Lungs/Pleura: Intubated, ET tube terminates above the carina. Atelectatic changes on the carina and other major airways which do remain patent. Increased bilateral pleural effusions, with a mix of layering and sub pulmonic fluid. Chronic right lower lobe consolidation. New left lower lobe consolidation. There is confluent peribronchial opacity in the anterior right upper lobe, with superimposed posterior right upper lobe consolidation. The left upper lobe is relatively spared but there is patchy perihilar opacity on the left. Linear opacity in the lingula more resembles atelectasis. The right middle lobe is relatively spared. Upper Abdomen: Enteric tube terminates in the proximal gastric body. There is a small 9 millimeter square or round object adjacent to the tube which seems to be an ingested foreign body on series 5, image 110. Otherwise stable visible upper abdomen from the recent CT Abdomen and Pelvis on 11/27/2018. Musculoskeletal: Osteopenia and widespread ankylosis in the spine. Scoliosis. Partially visible right upper extremity vascular stent or graft. No acute osseous abnormality identified. Review of the MIP images confirms the above findings. IMPRESSION: 1. No evidence of acute pulmonary embolus. 2. Increased bilateral pulmonary consolidation and pleural effusions since 11/27/2018. Bilateral Pneumonia strongly suspected, although some of the affected lung might be simply atelectatic. 3. Enteric tube courses to the stomach and there appears to be a small  ingested 9 mm foreign body adjacent to the tube on series 5, image 110. 4. ET tube terminates above the carina with atelectatic changes to the more distal airways. Electronically Signed   By: Genevie Ann M.D.   On: 12/05/2018 13:32      A:  Acute resp failure due obesity, volume overload in setting of ESRD with R > L effusio and LLL consolidation  - unable to wean  STage 4 sacral decub   P: RT thora - therapeutic and diagnostic  CRRT Hopefully above can help liberate off ventilator so family can continue with goals of care discusssion  D/w Dr Hollie Salk at bedside   Anti-infectives (From admission, onward)   Start     Dose/Rate Route Frequency Ordered Stop   12/05/18 1600  ceFEPIme (MAXIPIME) 1 g in sodium  chloride 0.9 % 100 mL IVPB     1 g 200 mL/hr over 30 Minutes Intravenous Every 24 hours 12/05/18 1450     12/03/18 1044  vancomycin (VANCOCIN) 1-5 GM/200ML-% IVPB    Note to Pharmacy: Wallace Cullens   : cabinet override      12/03/18 1044 12/03/18 2259   12/01/18 1425  vancomycin (VANCOCIN) 1-5 GM/200ML-% IVPB    Note to Pharmacy: Tawanna Solo   : cabinet override      12/01/18 1425 12/02/18 0229   12/01/18 1200  vancomycin (VANCOCIN) IVPB 1000 mg/200 mL premix  Status:  Discontinued     1,000 mg 200 mL/hr over 60 Minutes Intravenous  Once 11/30/18 1553 12/05/18 1110   11/28/18 1003  vancomycin (VANCOCIN) 1-5 GM/200ML-% IVPB    Note to Pharmacy: Tawanna Solo   : cabinet override      11/28/18 1003 11/28/18 2214   11/26/18 1800  ceFEPIme (MAXIPIME) 2 g in sodium chloride 0.9 % 100 mL IVPB  Status:  Discontinued     2 g 200 mL/hr over 30 Minutes Intravenous Every T-Th-Sa (1800) 11/25/18 0916 12/05/18 1450   11/26/18 1659  vancomycin (VANCOCIN) 1-5 GM/200ML-% IVPB    Note to Pharmacy: Cherylann Banas   : cabinet override      11/26/18 1659 11/26/18 2037   11/26/18 1200  vancomycin (VANCOCIN) IVPB 1000 mg/200 mL premix     1,000 mg 200 mL/hr over 60 Minutes Intravenous Every T-Th-Sa  (Hemodialysis) 11/23/2018 1916     11/25/18 0945  ceFEPIme (MAXIPIME) 2 g in sodium chloride 0.9 % 100 mL IVPB     2 g 200 mL/hr over 30 Minutes Intravenous STAT 11/25/18 0915 11/25/18 1039   11/10/2018 1930  vancomycin (VANCOCIN) 2,500 mg in sodium chloride 0.9 % 500 mL IVPB     2,500 mg 250 mL/hr over 120 Minutes Intravenous  Once 10/31/2018 1847 11/21/2018 2342         The patient is critically ill with multiple organ systems failure and requires high complexity decision making for assessment and support, frequent evaluation and titration of therapies, application of advanced monitoring technologies and extensive interpretation of multiple databases.   Critical Care Time devoted to patient care services described in this note is  30  Minutes. This time reflects time of care of this signee Dr Brand Males. This critical care time does not reflect procedure time, or teaching time or supervisory time of PA/NP/Med student/Med Resident etc but could involve care discussion time     Dr. Brand Males, M.D., [facility].C.P Pulmonary and Critical Care Medicine Staff Physician [facility] Pager: [phone], If no answer or between  15:00h - 7:00h: call [phone]  12/06/2018 9:56 AM

## 2018-12-06 NOTE — Progress Notes (Addendum)
Patient is alert and able to communicate needs. Patient adamantly refusing dressing change on multiple occasions as well as turning schedule. Patient's bed placed on rotation. VS stable with low-dose levophed on board as well as fentanyl drip for pain. Will pass on to day shift and continue to monitor.

## 2018-12-06 NOTE — Progress Notes (Signed)
Pharmacy Antibiotic Note  [name] is a 63 y.o. male admitted on 11/27/2018 with wound infection /decubitus ulcer.  Pharmacy consulted to dose Vancomycin and Cefepime for sepsis due to stage I sacral decubitus ulcer.  WBC down slightly to 20.6. He remains afebrile but trending up. Has ESRD and was receiving HD but will now be starting CRRT. Last HD session was early morning of 10/8. On 10/8, he received post-HD vancomycin dose and 1g dose of cefepime.  CRRT was started around 1200 on 10/9. Will adjust timing and doses of antibiotics accordingly.   Height: 5\' 5"  (165.1 cm) Weight: 271 lb 2.7 oz (123 kg) IBW/kg (Calculated) : 61.5  Temp (24hrs), Avg:97.9 F (36.6 C), Min:95.5 F (35.3 C), Max:99.9 F (37.7 C)  Recent Labs  Lab 12/01/18 0917  12/03/18 0423 12/04/18 0332 12/05/18 0705 12/05/18 1118 12/06/18 0252  WBC 14.8*  --  19.8* 17.8* 23.2*  --  20.6*  CREATININE  --    < > 3.21* 2.44* 1.84* 2.00* 2.43*  VANCORANDOM  --   --   --   --  14  --   --    < > = values in this interval not displayed.    Estimated Creatinine Clearance: 38.9 mL/min (A) (by C-G formula based on SCr of 2.43 mg/dL (H)).    No Known Allergies  Antimicrobials this admission: 9/27 Vanc >> 9/28 Cefepime >>  Microbiology results: 9/27 COVID: negative 10/8 BCx: ngtd . Plan: - Change Vancomycin to 1250 mg Q24 hrs  - Change Cefepime to 2 g Q12 hrs - Monitor CRRT duration  - Continue to monitor cultures and sensitivities and clinical progression  Richardine Service, PharmD PGY1 Pharmacy Resident Phone: [phone] 12/06/2018  11:05 AM  Please check [email or web address] for unit-specific pharmacy phone numbers.

## 2018-12-06 NOTE — Progress Notes (Addendum)
[facility]/Trauma Progress Note      Assessment/Plan DM ESRD - TTS Hypotension Hypothyroidism HLD S/p Left AKA  Sacral wound -wound looks improved.  - cont hydrotherapyand add dakins for likely pseudomonas. No surgical intervention indicated at this time - will sign off. Please page [place] for any further needs for this pt.   ID -maxipime/vancomycin 9/27>> VTE -SCDs, sq heparin FEN -NPO, pt on Vent Foley -urostomy Follow up -TBD  Plan:  continue hydrotherapy. Wound is cleaning up.      LOS: 12 days    Subjective/CC:  Pt on vent. Wife at bedside. About to undergo hydrotherapy.  Objective: Vital signs in last 24 hours: Temp:  [95.5 F (35.3 C)-100.3 F (37.9 C)] 100.3 F (37.9 C) (10/09 1100) Pulse Rate:  [38-128] 104 (10/09 1230) Resp:  [0-21] 7 (10/09 1230) BP: (65-128)/(46-76) 93/53 (10/09 1230) SpO2:  [95 %-100 %] 99 % (10/09 1230) FiO2 (%):  [40 %-60 %] 40 % (10/09 1148) Weight:  RG:[id number] kg] 123 kg (10/09 0421) Last BM Date: 12/04/18  Intake/Output from previous day: 10/08 0701 - 10/09 0700 In: 2820.3 [I.V.:350.7; NG/GT:510; IV Piggyback:1959.7] Out: 50 [Urine:50] Intake/Output this shift: Total I/O In: 335.7 [I.V.:155.2; NG/GT:180; IV Piggyback:0.5] Out: 45 [Other:45]  PE: Gen:  Alert, NAD, on vent Skin: sacral wound see photo below, no fluctuance or purulent drainage noted.        Anti-infectives: Anti-infectives (From admission, onward)   Start     Dose/Rate Route Frequency Ordered Stop   12/07/18 1200  vancomycin (VANCOCIN) IVPB 1000 mg/200 mL premix     1,000 mg 200 mL/hr over 60 Minutes Intravenous Daily 12/06/18 1322     12/06/18 1800  ceFEPIme (MAXIPIME) 2 g in sodium chloride 0.9 % 100 mL IVPB     2 g 200 mL/hr over 30 Minutes Intravenous Every 12 hours 12/06/18 1322     12/05/18 1600  ceFEPIme (MAXIPIME) 1 g in sodium chloride 0.9 % 100 mL IVPB  Status:  Discontinued     1 g 200 mL/hr over 30 Minutes Intravenous  Every 24 hours 12/05/18 1450 12/06/18 1322   12/03/18 1044  vancomycin (VANCOCIN) 1-5 GM/200ML-% IVPB    Note to Pharmacy: Wallace Cullens   : cabinet override      12/03/18 1044 12/03/18 2259   12/01/18 1425  vancomycin (VANCOCIN) 1-5 GM/200ML-% IVPB    Note to Pharmacy: Tawanna Solo   : cabinet override      12/01/18 1425 12/02/18 0229   12/01/18 1200  vancomycin (VANCOCIN) IVPB 1000 mg/200 mL premix  Status:  Discontinued     1,000 mg 200 mL/hr over 60 Minutes Intravenous  Once 11/30/18 1553 12/05/18 1110   11/28/18 1003  vancomycin (VANCOCIN) 1-5 GM/200ML-% IVPB    Note to Pharmacy: Tawanna Solo   : cabinet override      11/28/18 1003 11/28/18 2214   11/26/18 1800  ceFEPIme (MAXIPIME) 2 g in sodium chloride 0.9 % 100 mL IVPB  Status:  Discontinued     2 g 200 mL/hr over 30 Minutes Intravenous Every T-Th-Sa (1800) 11/25/18 0916 12/05/18 1450   11/26/18 1659  vancomycin (VANCOCIN) 1-5 GM/200ML-% IVPB    Note to Pharmacy: Cherylann Banas   : cabinet override      11/26/18 1659 11/26/18 2037   11/26/18 1200  vancomycin (VANCOCIN) IVPB 1000 mg/200 mL premix  Status:  Discontinued     1,000 mg 200 mL/hr over 60 Minutes Intravenous Every T-Th-Sa (Hemodialysis) 11/01/2018 1916 12/06/18 1322  11/25/18 0945  ceFEPIme (MAXIPIME) 2 g in sodium chloride 0.9 % 100 mL IVPB     2 g 200 mL/hr over 30 Minutes Intravenous STAT 11/25/18 0915 11/25/18 1039   11/08/2018 1930  vancomycin (VANCOCIN) 2,500 mg in sodium chloride 0.9 % 500 mL IVPB     2,500 mg 250 mL/hr over 120 Minutes Intravenous  Once 10/29/2018 1847 11/04/2018 2342      Lab Results:  Recent Labs    12/06/18 0252 12/06/18 1255  WBC 20.6* 19.3*  HGB 7.7* 8.1*  HCT 28.0* 28.3*  PLT 148* 141*   BMET Recent Labs    12/05/18 1118 12/05/18 1156 12/06/18 0252  NA 134* 136 135  K 4.7 3.9 3.7  CL 99  --  100  CO2 27  --  24  GLUCOSE 113*  --  139*  BUN 21  --  29*  CREATININE 2.00*  --  2.43*  CALCIUM 8.3*  --  8.1*   PT/INR No  results for input(s): LABPROT, INR in the last 72 hours. CMP     Component Value Date/Time   NA 135 12/06/2018 0252   NA 136 (A) 04/29/2015   NA 140 03/10/2008 1321   K 3.7 12/06/2018 0252   K 4.1 03/10/2008 1321   CL 100 12/06/2018 0252   CL 99 03/10/2008 1321   CO2 24 12/06/2018 0252   CO2 30 03/10/2008 1321   GLUCOSE 139 (H) 12/06/2018 0252   GLUCOSE 163 (H) 03/10/2008 1321   BUN 29 (H) 12/06/2018 0252   BUN 15 04/29/2015   BUN 14 03/10/2008 1321   CREATININE 2.43 (H) 12/06/2018 0252   CREATININE 5.73 (H) 07/17/2013 1557   CALCIUM 8.1 (L) 12/06/2018 0252   CALCIUM 9.4 03/10/2008 1321   PROT 6.4 (L) 11/25/2018 0634   PROT 6.9 03/10/2008 1321   ALBUMIN 2.3 (L) 12/06/2018 0252   ALBUMIN 3.4 03/10/2008 1321   AST 12 (L) 11/25/2018 0634   AST 21 03/10/2008 1321   ALT 11 11/25/2018 0634   ALT 20 03/10/2008 1321   ALKPHOS 206 (H) 11/25/2018 0634   ALKPHOS 69 03/10/2008 1321   BILITOT 1.2 11/25/2018 0634   BILITOT 0.80 03/10/2008 1321   GFRNONAA 28 (L) 12/06/2018 0252   GFRNONAA 23 (L) 03/07/2013 1719   GFRAA 32 (L) 12/06/2018 0252   GFRAA 26 (L) 03/07/2013 1719   Lipase     Component Value Date/Time   LIPASE 11 10/10/2012 1306    Studies/Results: Dg Chest 1 View  Result Date: 12/06/2018 CLINICAL DATA:  63 year old male central line placement. EXAM: CHEST  1 VIEW COMPARISON:  12/05/2018 and earlier. FINDINGS: Portable AP semi upright view at 1150 hours. The patient is mildly rotated to the right. Right IJ approach catheter tip projects at the level of the carina, SVC. ET tube in good position just below the clavicles. Enteric tube courses to the left upper quadrant and is likely stable from yesterday. Confluent right greater than left lung base opacity has probably not significantly changed from the CTA yesterday. Stable cardiac size and mediastinal contours. IMPRESSION: 1. Right IJ approach venous catheter tip at the level of the SVC. No pneumothorax. 2. Otherwise stable  chest from the CT yesterday with right greater than left lung base opacification. Electronically Signed   By: Genevie Ann M.D.   On: 12/06/2018 12:19   Dg Chest 1 View  Result Date: 12/05/2018 CLINICAL DATA:  Shortness of breath, intubation EXAM: CHEST  1 VIEW COMPARISON:  12/03/2018  FINDINGS: Interval endotracheal intubation, tip positioned over the mid trachea. Low volume examination with extensive, new perihilar heterogeneous airspace opacity of the right lung and an unchanged focal opacity of the peripheral left lower lung. Cardiomegaly, this appearance likely exaggerated by low volume technique. IMPRESSION: 1. Interval endotracheal intubation, tip positioned over the mid trachea. 2. Low volume examination with extensive, new perihilar heterogeneous airspace opacity of the right lung, which may reflect asymmetric edema or infection, and an unchanged focal opacity of the peripheral left lower lung. 3. Cardiomegaly, this appearance likely exaggerated by low volume technique. Electronically Signed   By: Eddie Candle M.D.   On: 12/05/2018 10:10   Dg Abd 1 View  Result Date: 12/05/2018 CLINICAL DATA:  NG tube placement. EXAM: ABDOMEN - 1 VIEW COMPARISON:  CT scan 11/27/2018 FINDINGS: There is an NG tube coursing down the esophagus and into the stomach. The tip is in the body region. The endotracheal tube is in good position, 3.7 cm above the carina. Persistent bilateral infiltrates. IMPRESSION: NG tube tip is in the body region of the stomach. Electronically Signed   By: Marijo Sanes M.D.   On: 12/05/2018 12:49   Ct Angio Chest Pe W Or Wo Contrast  Result Date: 12/05/2018 CLINICAL DATA:  63 year old male with recent cardiac arrest. Dialysis. EXAM: CT ANGIOGRAPHY CHEST WITH CONTRAST TECHNIQUE: Multidetector CT imaging of the chest was performed using the standard protocol during bolus administration of intravenous contrast. Multiplanar CT image reconstructions and MIPs were obtained to evaluate the vascular  anatomy. CONTRAST:  28mL OMNIPAQUE IOHEXOL 350 MG/ML SOLN COMPARISON:  Portable chest today. CTA chest 11/15/2007. CT Abdomen and Pelvis 11/27/2018. FINDINGS: Cardiovascular: Good contrast bolus timing in the pulmonary arterial tree. No focal filling defect identified in the pulmonary arteries to suggest acute pulmonary embolism. Calcified coronary artery atherosclerosis. Little contrast in the aorta today. Mild cardiomegaly without pericardial effusion. Mediastinum/Nodes: Enteric tube courses in the esophagus to the abdomen. No lymphadenopathy. Lungs/Pleura: Intubated, ET tube terminates above the carina. Atelectatic changes on the carina and other major airways which do remain patent. Increased bilateral pleural effusions, with a mix of layering and sub pulmonic fluid. Chronic right lower lobe consolidation. New left lower lobe consolidation. There is confluent peribronchial opacity in the anterior right upper lobe, with superimposed posterior right upper lobe consolidation. The left upper lobe is relatively spared but there is patchy perihilar opacity on the left. Linear opacity in the lingula more resembles atelectasis. The right middle lobe is relatively spared. Upper Abdomen: Enteric tube terminates in the proximal gastric body. There is a small 9 millimeter square or round object adjacent to the tube which seems to be an ingested foreign body on series 5, image 110. Otherwise stable visible upper abdomen from the recent CT Abdomen and Pelvis on 11/27/2018. Musculoskeletal: Osteopenia and widespread ankylosis in the spine. Scoliosis. Partially visible right upper extremity vascular stent or graft. No acute osseous abnormality identified. Review of the MIP images confirms the above findings. IMPRESSION: 1. No evidence of acute pulmonary embolus. 2. Increased bilateral pulmonary consolidation and pleural effusions since 11/27/2018. Bilateral Pneumonia strongly suspected, although some of the affected lung might  be simply atelectatic. 3. Enteric tube courses to the stomach and there appears to be a small ingested 9 mm foreign body adjacent to the tube on series 5, image 110. 4. ET tube terminates above the carina with atelectatic changes to the more distal airways. Electronically Signed   By: Genevie Ann M.D.   On:  12/05/2018 13:32     Kalman Drape, [facility] Pager [phone] Cristine Polio, & Friday 7:00am - 4:30pm Thursdays 7:00am -11:30am

## 2018-12-06 NOTE — Procedures (Signed)
Central Venous Catheter Insertion Procedure Note WOODWARD TEPLY [id number] 08-Aug-1955  Procedure: Insertion of Central Venous Catheter Indications: CRRT  Procedure Details Consent: Risks of procedure as well as the alternatives and risks of each were explained to the (patient/caregiver).  Consent for procedure obtained. Time Out: Verified patient identification, verified procedure, site/side was marked, verified correct patient position, special equipment/implants available, medications/allergies/relevent history reviewed, required imaging and test results available.  Performed  Maximum sterile technique was used including antiseptics, cap, gloves, gown, hand hygiene, mask and sheet. Skin prep: Chlorhexidine x3; local anesthetic administered A antimicrobial bonded/coated triple lumen catheter was placed in the right internal jugular vein using the Seldinger technique. Catheter placed to 15 cm. Blood aspirated via all 3 ports and then flushed x 3. Line sutured x 2 and dressing applied.  Ultrasound guidance used.Yes.    Evaluation Blood flow good Complications: No apparent complications Patient did tolerate procedure well. Chest X-ray ordered to verify placement.  CXR: pending.  Doristine Mango, DO PGY-2 Family medicince

## 2018-12-06 NOTE — Progress Notes (Signed)
[facility] Progress Note   Subjective: Intubated, appears uncomfortable.  Mult conversations held yesterday.  Full scope of care still.  CT chest with effusions/ fluid.  Objective Vitals:   12/06/18 0915 12/06/18 0930 12/06/18 0945 12/06/18 1000  BP: 96/60 (!) 118/54 (!) 95/56 (!) 90/55  Pulse: (!) 113 (!) 119 (!) 116 (!) 43  Resp: (!) 0 (!) _0 Temp:      TempSrc:      SpO2: 97% 98% 98% 100%  Weight:      Height:       Physical Exam General: Obese, chronically ill appearing male, intubated Heart: S1,S2 RRR Lungs: mechanical breath sounds bilaterally Abdomen: Active BS, obese, NT, subcutaneous bruising  Extremities: L AKA with 2+ stump edema. LLE 1+ edema. Dialysis Access: R AVF + bruit, aneurysmal SKIN: large stage IV sacral decub pics reviewed in Epic    Additional Objective Labs: Basic Metabolic Panel: Recent Labs  Lab 12/04/18 0332 12/05/18 0705 12/05/18 1118 12/05/18 1156 12/06/18 0252  NA 131* 135 134* 136 135  K 4.3 3.6 4.7 3.9 3.7  CL 94* 98 99  --  100  CO2 _1 --  24  GLUCOSE 146* 123* 113*  --  139*  BUN 33* 20 21  --  29*  CREATININE 2.44* 1.84* 2.00*  --  2.43*  CALCIUM 7.7* 8.6* 8.3*  --  8.1*  PHOS 2.7  --  2.9  --  1.3*   Liver Function Tests: Recent Labs  Lab 12/04/18 0332 12/05/18 1118 12/06/18 0252  ALBUMIN 2.2* 2.6* 2.3*   No results for input(s): LIPASE, AMYLASE in the last 168 hours. CBC: Recent Labs  Lab 12/01/18 0917 12/03/18 0423 12/04/18 0332 12/05/18 0705 12/05/18 1156 12/06/18 0252  WBC 14.8* 19.8* 17.8* 23.2*  --  20.6*  NEUTROABS  --   --   --  21.1*  --   --   HGB 8.5* 8.4* 8.5* 8.8* 10.2* 7.7*  HCT 30.8* 30.5* 30.7* 32.3* 30.0* 28.0*  MCV 89.3 88.9 90.0 92.0  --  90.6  PLT 113* 149* 126* 135*  --  148*   Blood Culture    Component Value Date/Time   SDES BLOOD LEFT ARM 12/05/2018 1140   SPECREQUEST  12/05/2018 1140    BOTTLES DRAWN AEROBIC ONLY Blood Culture results may not be  optimal due to an inadequate volume of blood received in culture bottles   CULT  12/05/2018 1140    NO GROWTH < 24 HOURS Performed at [facility] [address], [place], [place] [address]    REPTSTATUS PENDING 12/05/2018 1140    Cardiac Enzymes: No results for input(s): CKTOTAL, CKMB, CKMBINDEX, TROPONINI in the last 168 hours. CBG: Recent Labs  Lab 12/05/18 1338 12/05/18 1941 12/05/18 2337 12/06/18 0353 12/06/18 0706  GLUCAP 121* 138* 151* 158* 157*   Iron Studies: No results for input(s): IRON, TIBC, TRANSFERRIN, FERRITIN in the last 72 hours. _2 @ Studies/Results: Dg Chest 1 View  Result Date: 12/05/2018 CLINICAL DATA:  Shortness of breath, intubation EXAM: CHEST  1 VIEW COMPARISON:  12/03/2018 FINDINGS: Interval endotracheal intubation, tip positioned over the mid trachea. Low volume examination with extensive, new perihilar heterogeneous airspace opacity of the right lung and an unchanged focal opacity of the peripheral left lower lung. Cardiomegaly, this appearance likely exaggerated by low volume technique. IMPRESSION: 1. Interval endotracheal intubation, tip positioned over the mid trachea. 2. Low volume examination with extensive, new perihilar heterogeneous airspace opacity  of the right lung, which may reflect asymmetric edema or infection, and an unchanged focal opacity of the peripheral left lower lung. 3. Cardiomegaly, this appearance likely exaggerated by low volume technique. Electronically Signed   By: Eddie Candle M.D.   On: 12/05/2018 10:10   Dg Abd 1 View  Result Date: 12/05/2018 CLINICAL DATA:  NG tube placement. EXAM: ABDOMEN - 1 VIEW COMPARISON:  CT scan 11/27/2018 FINDINGS: There is an NG tube coursing down the esophagus and into the stomach. The tip is in the body region. The endotracheal tube is in good position, 3.7 cm above the carina. Persistent bilateral infiltrates. IMPRESSION: NG tube tip is in the body region of the stomach.  Electronically Signed   By: Marijo Sanes M.D.   On: 12/05/2018 12:49   Ct Angio Chest Pe W Or Wo Contrast  Result Date: 12/05/2018 CLINICAL DATA:  63 year old male with recent cardiac arrest. Dialysis. EXAM: CT ANGIOGRAPHY CHEST WITH CONTRAST TECHNIQUE: Multidetector CT imaging of the chest was performed using the standard protocol during bolus administration of intravenous contrast. Multiplanar CT image reconstructions and MIPs were obtained to evaluate the vascular anatomy. CONTRAST:  1m OMNIPAQUE IOHEXOL 350 MG/ML SOLN COMPARISON:  Portable chest today. CTA chest 11/15/2007. CT Abdomen and Pelvis 11/27/2018. FINDINGS: Cardiovascular: Good contrast bolus timing in the pulmonary arterial tree. No focal filling defect identified in the pulmonary arteries to suggest acute pulmonary embolism. Calcified coronary artery atherosclerosis. Little contrast in the aorta today. Mild cardiomegaly without pericardial effusion. Mediastinum/Nodes: Enteric tube courses in the esophagus to the abdomen. No lymphadenopathy. Lungs/Pleura: Intubated, ET tube terminates above the carina. Atelectatic changes on the carina and other major airways which do remain patent. Increased bilateral pleural effusions, with a mix of layering and sub pulmonic fluid. Chronic right lower lobe consolidation. New left lower lobe consolidation. There is confluent peribronchial opacity in the anterior right upper lobe, with superimposed posterior right upper lobe consolidation. The left upper lobe is relatively spared but there is patchy perihilar opacity on the left. Linear opacity in the lingula more resembles atelectasis. The right middle lobe is relatively spared. Upper Abdomen: Enteric tube terminates in the proximal gastric body. There is a small 9 millimeter square or round object adjacent to the tube which seems to be an ingested foreign body on series 5, image 110. Otherwise stable visible upper abdomen from the recent CT Abdomen and Pelvis  on 11/27/2018. Musculoskeletal: Osteopenia and widespread ankylosis in the spine. Scoliosis. Partially visible right upper extremity vascular stent or graft. No acute osseous abnormality identified. Review of the MIP images confirms the above findings. IMPRESSION: 1. No evidence of acute pulmonary embolus. 2. Increased bilateral pulmonary consolidation and pleural effusions since 11/27/2018. Bilateral Pneumonia strongly suspected, although some of the affected lung might be simply atelectatic. 3. Enteric tube courses to the stomach and there appears to be a small ingested 9 mm foreign body adjacent to the tube on series 5, image 110. 4. ET tube terminates above the carina with atelectatic changes to the more distal airways. Electronically Signed   By: HGenevie AnnM.D.   On: 12/05/2018 13:32   Medications: . ceFEPime (MAXIPIME) IV Stopped (12/05/18 2040)  . fentaNYL infusion INTRAVENOUS 50 mcg/hr (12/06/18 0600)  . norepinephrine (LEVOPHED) Adult infusion 6 mcg/min (12/06/18 06712  . sodium phosphate  Dextrose 5% IVPB    . vancomycin Stopped (12/05/18 1503)   . midazolam      . chlorhexidine gluconate (MEDLINE KIT)  15 mL Mouth Rinse  BID  . Chlorhexidine Gluconate Cloth  6 each Topical Q0600  . collagenase   Topical Daily  . [START ON 12/07/2018] darbepoetin (ARANESP) injection - DIALYSIS  200 mcg Intravenous Q Sat-HD  . feeding supplement (PRO-STAT SUGAR FREE 64)  60 mL Per Tube TID  . feeding supplement (VITAL 1.5 CAL)  1,000 mL Per Tube Q24H  . heparin  5,000 Units Subcutaneous Q8H  . insulin aspart  0-9 Units Subcutaneous Q4H  . insulin glargine  20 Units Subcutaneous QHS  . levothyroxine  125 mcg Per NG tube Q0600  . mouth rinse  15 mL Mouth Rinse 10 times per day  . midodrine  15 mg Per NG tube TID WC  . pantoprazole sodium  40 mg Per Tube Daily  . sevelamer carbonate  800 mg Per NG tube TID WC     Dialysis Orders: [facility] [address]  3h 70mn 113kg (250 lbs) 2/2.5  bath  R AVF -No Heparin  -Mircera 200 mcg IV q 2 weeks last dose 8/27  Assessment/Plan:  1. Acute hypoxic RF: intubated in the room.  PCCM on the case now.  Poor prognosis.   2. Stage IV sacral decubitus ulcer - Limited mobility. Mostly wheelchair/bedbound. S/P bedside debridement per surgery 9/28. Hydrotherapy, Vanc, cefepime - per primary.[place]  Refusing positioning and dressing changes now. 3. Abdominal Pain: CT shows small residual collection posterion schedule. r border of lived concerning for abscess. On Vanc/Cefepime.  4. ESRD -HD TTS. Continue on schedule. Had HD 10/06.  Low temp, got albumin.  Na 128 which notes vol overload, extra rx 10/7.  He was supposed to get HD again 10/8--> fam discussions had, still full scope care, now on pressors, will need CRRT and have discussed with PCCM.  Appreciate assistance   5. Hypotension/volume: vol overloaded and hypotensive, on pressors and midodrine. 6. Anemia - Hgb 8.5 Rec'd Aranesp 100 mcg IV Follow HGB--> Increase to 200 q Saturday for 10/10  7. Metabolic bone disease -Phos low, stop renvela, getting Na phos today 8. Nutrition -Albumin 1.9. Prot supp for low albumin  9. DM -insulin per primary  10. S/p cystectomy with urostomy 11. Dispo:  Appreciate PCCM and palliative.  His prognosis is very poor.  Full scope care for now; CRRT unlikely to ultimately correct problem of inability to UF as outpatient.   EMadelon LipsMD 12/06/2018, 10:45 AM  [facility] [phone]

## 2018-12-06 NOTE — Progress Notes (Signed)
Daily Progress Note   Patient Name: [name]       Date: 12/06/2018 DOB: 01-16-1956  Age: 63 y.o. MRN#: [id number] Attending Physician: Brand Males, MD Primary Care Physician: [place] Date: 11/26/2018  Reason for Consultation/Follow-up: Establishing goals of care and Psychosocial/spiritual support  Subjective: Spoke with ICU RN.  Patient is still refusing dressing changes.  He is in a sterile procedure having access for CRRT placed.  I spoke with [facility] in the waiting area.  Sandy states "I know he does not have long left, but I just don't think he's ready yet".  She has seen him come back from the brink many times in the past.    I talked with her about surrounding herself with support to be prepared.  She has family locally but has not gone to them for support.  I talked with her about CRRT will likely make him better temporarily but it will not FIX anything.  He is unable to sit for hemodialysis with his wound.  Sandy replied "but the wound is getting better"   Assessment: 63 yo obese chronically ill fellow.  Hypotensive on HD.  Unable to sit for outpatient HD due to significant stage 4 sacral wound.  Patient frequently refusing dressing changes.  Very nice wife who is always here during visiting hours.  She's a bit closed off. (protective)   Patient Profile/HPI:  63 y.o.malewith past medical history of ESRD on HD, PVD s/p L AKA, DVT, anxiety, urostomy, DM, and a recent admission at [facility] with a liver abscess that required drainage and antibioticswho was admitted on 9/27/2020with an infected stage 4 sacral decubitus ulceration. He underwent debridement on 9/28.He has had difficulty this hospitalization with hypotension, pain, and  lethargy. Imaging shows he still has a fluid collection in his liver that is 3.8 x 2.5 cm.  Length of Stay: 12  Current Medications: Scheduled Meds:  . lidocaine (PF)      . chlorhexidine gluconate (MEDLINE KIT)  15 mL Mouth Rinse BID  . Chlorhexidine Gluconate Cloth  6 each Topical Q0600  . collagenase   Topical Daily  . [START ON 12/07/2018] darbepoetin (ARANESP) injection - DIALYSIS  200 mcg Intravenous Q Sat-HD  . feeding supplement (PRO-STAT SUGAR FREE 64)  60 mL Per Tube TID  .  feeding supplement (VITAL 1.5 CAL)  1,000 mL Per Tube Q24H  . heparin  5,000 Units Subcutaneous Q8H  . insulin aspart  0-9 Units Subcutaneous Q4H  . insulin glargine  20 Units Subcutaneous QHS  . levothyroxine  125 mcg Per NG tube Q0600  . mouth rinse  15 mL Mouth Rinse 10 times per day  . midodrine  15 mg Per NG tube TID WC  . pantoprazole sodium  40 mg Per Tube Daily    Continuous Infusions: .  prismasol BGK 4/2.5 500 mL/hr at 12/06/18 1205  .  prismasol BGK 4/2.5 200 mL/hr at 12/06/18 1211  . ceFEPime (MAXIPIME) IV    . fentaNYL infusion INTRAVENOUS 100 mcg/hr (12/06/18 1500)  . norepinephrine (LEVOPHED) Adult infusion 7 mcg/min (12/06/18 1500)  . prismasol BGK 4/2.5 2,000 mL/hr at 12/06/18 1209  . sodium phosphate  Dextrose 5% IVPB 43 mL/hr at 12/06/18 1500  . [START ON 12/07/2018] vancomycin      PRN Meds: acetaminophen **OR** acetaminophen, fentaNYL, heparin, ondansetron **OR** ondansetron (ZOFRAN) IV, sodium chloride, sodium hypochlorite  Physical Exam       Exam not performed.  Patient in procedure  Vital Signs: BP 93/68   Pulse (!) 114   Temp 98.1 F (36.7 C) (Axillary)   Resp (!) 21   Ht '5\' 5"'  (1.651 m)   Wt 123 kg   SpO2 100%   BMI 45.12 kg/m  SpO2: SpO2: 100 % O2 Device: O2 Device: Ventilator O2 Flow Rate: O2 Flow Rate (L/min): 10 L/min  Intake/output summary:   Intake/Output Summary (Last 24 hours) at 12/06/2018 1534 Last data filed at 12/06/2018 1500 Gross per 24 hour   Intake 3324.97 ml  Output 223 ml  Net 3101.97 ml   LBM: Last BM Date: 12/04/18 Baseline Weight: Weight: 113.4 kg Most recent weight: Weight: 123 kg       Palliative Assessment/Data: 20%    Flowsheet Rows     Most Recent Value  Intake Tab  Referral Department  Hospitalist  Unit at Time of Referral  Med/Surg Unit  Date Notified  11/29/18  Palliative Care Type  New Palliative care  Reason for referral  Clarify Goals of Care, Non-pain Symptom  Date of Admission  10/30/2018  Date first seen by Palliative Care  11/29/18  # of days Palliative referral response time  0 Day(s)  # of days IP prior to Palliative referral  5  Clinical Assessment  Psychosocial & Spiritual Assessment  Palliative Care Outcomes      Patient Active Problem List   Diagnosis Date Noted  . Adult failure to thrive   . DNR (do not resuscitate) discussion   . Chronic respiratory failure with hypoxia ([place])   . Decubitus ulcer of sacral region, stage 4 ([place]) 11/23/2018  . Sleep apnea   . Nephrolithiasis   . Impaired mobility and activities of daily living 09/27/2018  . History of noncompliance with medical treatment, presenting hazards to health 09/24/2018  . Acute on chronic respiratory failure with hypoxia and hypercapnia ([place]) 09/23/2018  . History of gastric polyp 02/13/2018  . Sedentary lifestyle 06/13/2017  . Pressure injury of skin 03/12/2017  . S/P AKA (above knee amputation) unilateral, left ([place]) 03/09/2017  . DDD (degenerative disc disease), lumbosacral   . Thrombocytopenia ([place])   . Idiopathic chronic venous hypertension of right lower extremity with inflammation   . Lower back pain 12/27/2016  . UTI (urinary tract infection) 12/27/2016  . Anemia, chronic disease 09/05/2016  . Closed fracture  of distal end of right femur ([place]) 09/05/2016  . Hemodialysis-associated hypotension 11/24/2015  . Left knee pain 11/24/2015  . Hypotension 11/24/2015  . Unstageable pressure ulcer of right buttock  ([place]) 10/05/2014  . Chronic venous insufficiency 09/08/2014  . ESRD on dialysis ([place]) 09/08/2014  . Diabetic neuropathy, type II diabetes mellitus ([place]) 09/08/2014  . Hyperlipidemia 12/23/2013  . Diabetic foot ([place]) 11/20/2013  . Class 3 severe obesity with body mass index (BMI) of 40.0 to 44.9 in adult ([place]) 07/04/2013  . Chronic pain 05/21/2013  . Chronic interstitial cystitis 02/06/2013  . UI (urinary incontinence) 10/29/2012  . End stage renal disease ([place]) 08/18/2012  . Acute respiratory failure with hypoxia ([place]) 08/14/2012  . Hyperkalemia 08/04/2012  . Hypothyroidism, acquired 08/04/2012  . OBESITY 09/21/2009  . Essential hypertension 09/21/2009  . DIVERTICULOSIS OF COLON 09/21/2009  . Type 2 diabetes mellitus with diabetic nephropathy, with long-term current use of insulin ([place]) 10/01/2007  . DVT 10/01/2007    Palliative Care Plan    Recommendations/Plan:  Is the liver abscess still an issue?  Receiving hydrotherapy for sacral wound.  Unlikely to be able to sit for HD  Patient still full code / full scope.  Would like to speak with patient directly when he is able to represent himself.  Patient and spouse need support.  Goals of Care and Additional Recommendations:  Limitations on Scope of Treatment: Full Scope Treatment  Code Status:  Full code  Prognosis:   at this point is very concerning.  Days to weeks.    Discharge Planning:  To Be Determined.  Possibly LTAC  Care plan was discussed with Lovey Newcomer, Wife  Thank you for allowing the Palliative Medicine Team to assist in the care of this patient.  Total time spent:  35 min.     Greater than 50%  of this time was spent counseling and coordinating care related to the above assessment and plan.  Florentina Jenny, PA-C Palliative Medicine  Please contact Palliative MedicineTeam phone at [phone] for questions and concerns between 7 am - 7 pm.   Please see AMION for individual provider pager  numbers.

## 2018-12-06 NOTE — Progress Notes (Signed)
NAME:  SHAWNEE PETZOLD, MRN:  [id number], DOB:  [birth year], LOS: 66 ADMISSION DATE:  11/09/2018, CONSULTATION DATE:  10/8 REFERRING MD:  Dr, Tawanna Solo, Amrit , CHIEF COMPLAINT:  Acute on chronic respiratory failure   Brief History   Patient is a 63-year male with history of ESRD on HD, peripheral vascular disease s/p left AKA, R femur fx, diabetes, morbid obesity, HTN, HLD who presented with infected sacral ulcer.  Patient is largely bedbound and sometimes ambulates with a wheelchair. He reported developing a small ulcer about 4 weeks ago which has now progressed to stage IV sacral decubitus ulcer. There was no report of fever or chills at home.  Patient was admitted for the management of decubitus ulcer.  History of present illness   10/8 patient had increasing hypoxia and lethargic requiring intubation. Unable to tolerate scheduled HD 2/2 hypotension. PCCM was consulted for management.   Past Medical History  Type 2 diabetes mellitus with diabetic nephropathy, with long-term current use of insulin (HCC)   OBESITY   Essential hypertension   End stage renal disease (HCC)   Chronic venous insufficiency   Unstageable pressure ulcer of right buttock (HCC)   Closed fracture of distal end of right femur (HCC)   S/P AKA (above knee amputation) unilateral, left ([place])   Decubitus ulcer of sacral region, stage 4 ([place])   DNR (do not resuscitate) discussion   Chronic respiratory failure with hypoxia ([place])   Adult failure to thrive  Significant Hospital Events   10/8 intubated  Consults:  PCCM Ortho surgery General surgery Palliative care nephrology  Procedures:  Dialysis Echo  Debridement   Significant Diagnostic Tests:  10/8- CTA chest 9/30-CT  Micro Data:  10/8 Blood Cx  Antimicrobials:  9/27 vancomycin 9/27 cefepime  Interim history/subjective:  Patient appears comfortable. He endorses discomfort with his breathing tube and desire to remove.   Objective   Blood pressure  (!) 98/58, pulse (!) 116, temperature 99.9 F (37.7 C), temperature source Axillary, resp. rate 16, height 5\' 5"  (1.651 m), weight 123 kg, SpO2 98 %.    Vent Mode: PRVC FiO2 (%):  [40 %-100 %] 40 % Set Rate:  [16 bmp] 16 bmp Vt Set:  [490 mL] 490 mL PEEP:  [8 cmH20] 8 cmH20 Plateau Pressure:  [18 cmH20-27 cmH20] 20 cmH20   Intake/Output Summary (Last 24 hours) at 12/06/2018 0753 Last data filed at 12/06/2018 0600 Gross per 24 hour  Intake 2753.15 ml  Output 50 ml  Net 2703.15 ml   Filed Weights   12/05/18 0040 12/05/18 0421 12/06/18 0421  Weight: 124.2 kg 123.3 kg 123 kg    Examination: General: obese male resting comfortable HENT: ETT and OG in place Lungs: breath sounds to bases bilaterally.  Cardiovascular: distant heart sounds, no murmur appreciated. tachycardic Abdomen: pbese, soft, non-tender to palpation Extremities: L AKA, R foot with significant ecchymosis in boot and 2+ edema Neuro: awake and alert. Able to respond to commands. No focal deficits noted Skin: no rashes anterior. Ecchymosis to [facility] Problem list     Assessment & Plan:  Mr. Legates is a 63yo male presenting to PCCM for management of intubation and vent settings.   Acute on chronic hypoxic respiratory failure: patient remains on vent at 40% FiO2 and 98% O2 sats. Baseline is 4L [place]. H/o OSA, pulm HTN, HFrEF, cardiorenal syndrome. CT 9/30 revealed new consolidation of LLL with air bronchograms with moderate right and small left pleural effusions and atelectasis. CTA  chest 10/8 revealed no PE. still significant pleural effusion R>L - continue vent per RT - consulting IR for bedside thoracentesis to improve respiratory function  Sepsis/septic shock:  Source likely sacral ulcers and/or abdominal abscess as seen on CT 9/30 and/or urostomy. Blood cultures 10/8 pending. Deescalate treatment as able. Patient currently requiring levophed at 61mcg to maintain systolic [id number]. Patient was hypotensive  overnight and received 1L IV hydration as well as albumin. patiet has chronic hypotension on midodrine. Patient remains tachycardic this am. Fluid resuscitation cautiously with patient's fragile fluid status.  - Consult to pharm for correct renal adjustment review of meds. - continue cefepime - continue vancomycin - f/u gen surg consult - Continue midodrine TID  Stage IV sacral decubitus ulcer: Underwent bedside debridement on 11/17/2018 by general surgery.   - continue antibiotics - continue wound care including hydrotherapy - continue pain management - PT/OT if appropriate  ESRD on hemodialysis: Dialyzed on TTS.  Nephrology following and is on dialysis.  Dialysis being difficult due to hypotension. 50cc UOP yesterday.  Diabetes type 2: stable. glucose ranged from 138-158 overnight. Received 20u lantus, 10u novolog on sliding scale. Recommend against tight control of blood sugars to avoid hypoglycemia.  - sSSI - 20u lantus daily.  Hypothyroidism: chronic. - Continue Synthyroid  Chronic normocytic anemia: Associated with ESRD. Hgb decreased from 10.2 yesterday to 7.7 today. Patient remains on heparin [place] ppx without clear signs of bleeding. - continue aranesp per nephro - CBC repeat today 1300  Goals of care: Palliative care was following.  Palliative care discussed with family. Wife wanted to continue full scope of treatment. Remains full code  Best practice:  Diet: OG 40/hr Pain/Anxiety/Delirium protocol (if indicated): fentanyl VAP protocol (if indicated): in place DVT prophylaxis: hep [place] GI prophylaxis: PPI, zofran Glucose control: 20u lantus + sSSI Mobility: bed rest Code Status: full Family Communication: wife at bedside Disposition: ICU  Labs   CBC: Recent Labs  Lab 12/01/18 0917 12/03/18 0423 12/04/18 0332 12/05/18 0705 12/05/18 1156 12/06/18 0252  WBC 14.8* 19.8* 17.8* 23.2*  --  20.6*  NEUTROABS  --   --   --  21.1*  --   --   HGB 8.5* 8.4* 8.5* 8.8*  10.2* 7.7*  HCT 30.8* 30.5* 30.7* 32.3* 30.0* 28.0*  MCV 89.3 88.9 90.0 92.0  --  90.6  PLT 113* 149* 126* 135*  --  148*    Basic Metabolic Panel: Recent Labs  Lab 12/02/18 0441 12/02/18 1106 12/03/18 0423 12/04/18 0332 12/05/18 0705 12/05/18 1118 12/05/18 1156 12/06/18 0252  NA  --  130* 128* 131* 135 134* 136 135  K  --  4.9 5.0 4.3 3.6 4.7 3.9 3.7  CL  --  95* 91* 94* 98 99  --  100  CO2  --  23 24 26 24 27   --  24  GLUCOSE  --  95 149* 146* 123* 113*  --  139*  BUN  --  39* 48* 33* 20 21  --  29*  CREATININE  --  2.73* 3.21* 2.44* 1.84* 2.00*  --  2.43*  CALCIUM  --  6.8* 7.0* 7.7* 8.6* 8.3*  --  8.1*  MG 1.8  --   --   --   --  1.9  --   --   PHOS  --  2.9 3.0 2.7  --  2.9  --  1.3*   GFR: Estimated Creatinine Clearance: 38.9 mL/min (A) (by C-G formula based on SCr of 2.43  mg/dL (H)). Recent Labs  Lab 12/03/18 0423 12/04/18 0332 12/05/18 0705 12/05/18 1118 12/06/18 0252  PROCALCITON  --   --   --  5.17 5.53  WBC 19.8* 17.8* 23.2*  --  20.6*    Liver Function Tests: Recent Labs  Lab 12/02/18 1106 12/03/18 0423 12/04/18 0332 12/05/18 1118 12/06/18 0252  ALBUMIN 2.0* 2.0* 2.2* 2.6* 2.3*   ABG    Component Value Date/Time   PHART 7.322 (L) 12/05/2018 1156   PCO2ART 57.4 (H) 12/05/2018 1156   PO2ART 107.0 12/05/2018 1156   HCO3 30.2 (H) 12/05/2018 1156   TCO2 32 12/05/2018 1156   ACIDBASEDEF 4.0 (H) 08/14/2012 1431   O2SAT 98.0 12/05/2018 1156    HbA1C: Hemoglobin A1C  Date/Time Value Ref Range Status  04/29/2015 5.3  Final   Hgb A1c MFr Bld  Date/Time Value Ref Range Status  03/09/2017 06:50 AM 5.1 4.8 - 5.6 % Final    Comment:    (NOTE) Pre diabetes:          5.7%-6.4% Diabetes:              >6.4% Glycemic control for   <7.0% adults with diabetes   03/20/2016 11:31 AM 5.3 4.6 - 6.5 % Final    Comment:    Glycemic Control Guidelines for People with Diabetes:Non Diabetic:  <6%Goal of Therapy: <7%Additional Action Suggested:  >8%      CBG: Recent Labs  Lab 12/05/18 1338 12/05/18 1941 12/05/18 2337 12/06/18 0353 12/06/18 0706  GLUCAP 121* 138* 151* 158* 157*    Review of Systems:   Unable to obtain  Past Medical History  He,  has a past medical history of Anemia, Anxiety, AR (allergic rhinitis), Arthritis, Blood transfusion, BPH (benign prostatic hypertrophy), Cellulitis of left leg (08/14/2012), Cholelithiasis, Chronic kidney disease, Chronic pain, Diabetes mellitus, Displacement of lumbar intervertebral disc without myelopathy, Diverticulosis of colon, DVT (deep venous thrombosis) ([place]), Embolism and thrombosis of unspecified artery ([place]), ESRD (end stage renal disease) ([place]), Gallstone, History of nephrolithiasis, Hyperlipidemia, Hypertension, Hypertrophy of prostate, Hypothyroidism, Impotence of organic origin, Morbid obesity ([place]), Nephrolithiasis, Non-pressure chronic ulcer of left calf, limited to breakdown of skin ([place]) (02/07/2016), Osteomyelitis ([place]), Other lymphedema, Pancreatitis, acute, Peripheral vascular disease ([place]), Personal history of arthritis, Pyelonephritis (10/29/2012), Pyogenic arthritis of left knee joint ([place]), S/P BKA (below knee amputation) unilateral ([place]) (01/22/2015), Sleep apnea, Subacute osteomyelitis of left tibia ([place]) (03/05/2017), Traumatic closed nondisplaced fracture of neck of femur, left, sequela (03/05/2017), and Unspecified septicemia(038.9) ([place]).   Surgical History    Past Surgical History:  Procedure Laterality Date  . AMPUTATION  02/07/2011   Procedure: AMPUTATION BELOW KNEE;  Surgeon: Newt Minion, MD;  Location: [facility];  Service: Orthopedics;  Laterality: Left;  Left Below Knee Amputation  . AMPUTATION Left 03/09/2017   Procedure: LEFT ABOVE KNEE AMPUTATION;  Surgeon: Newt Minion, MD;  Location: [facility];  Service: Orthopedics;  Laterality: Left;  . bilteral hip     replacement & revison  's   . CYSTOSCOPY W/ RETROGRADES Bilateral 10/24/2012   Procedure: CYSTOSCOPY WITH  RETROGRADE PYELOGRAM Procedure: Cystoscopy, Bilateral [place] Pyelograms, Bladder Biopsy, Hydrodistension;  Surgeon: Franchot Gallo, MD;  Location: [facility];  Service: Urology;  Laterality: Bilateral;  . KIDNEY SURGERY    . KNEE SURGERY     left knee    . LEG AMPUTATION BELOW KNEE    . REPLACEMENT TOTAL KNEE    . TONSILLECTOMY       Social History  reports that he has never smoked. He has never used smokeless tobacco. He reports that he does not drink alcohol or use drugs.   Family History   His family history includes Arthritis in his paternal grandmother; Colon cancer in his father; Dementia in his father; Diabetes in his mother; Heart attack in his father and mother; Hypertension in an other family member; Hypothyroidism in an other family member; Stroke in his mother.   Allergies No Known Allergies   Home Medications  Prior to Admission medications   Medication Sig Start Date End Date Taking? Authorizing Provider  acetaminophen (TYLENOL) 500 MG tablet Take 500 mg by mouth every 6 (six) hours as needed for moderate pain.   Yes [provider]  atorvastatin (LIPITOR) 40 MG tablet TAKE 1 TABLET (40 MG TOTAL) BY MOUTH DAILY. Patient taking differently: Take 40 mg by mouth daily.  09/13/15  Yes Brunetta Jeans, PA-C  gabapentin (NEURONTIN) 100 MG capsule TAKE 1 CAPSULE (100 MG TOTAL) BY MOUTH 3 (THREE) TIMES DAILY. Patient taking differently: Take 100 mg by mouth 3 (three) times daily.  12/26/15  Yes Brunetta Jeans, PA-C  HYDROcodone-acetaminophen (NORCO/VICODIN) 5-325 MG tablet Take 1 tablet by mouth every 6 (six) hours as needed (pain). 10/21/18  Yes Newt Minion, MD  insulin aspart (NOVOLOG) 100 UNIT/ML injection Inject 3-10 Units as directed 2 (two) times daily. Sliding scale 100-150 3 units 151-200 4 units 201-250 6 units 251-300 8 units 301-350 10 units 09/27/16  Yes [provider]  Insulin Glargine (BASAGLAR KWIKPEN) 100 UNIT/ML SOPN Inject 30 Units  into the skin daily.    Yes [provider]  levothyroxine (SYNTHROID) 125 MCG tablet Take by mouth. 07/05/18  Yes [provider]  loperamide (IMODIUM A-D) 2 MG tablet Take 2 mg by mouth 2 (two) times daily.    Yes [provider]  midodrine (PROAMATINE) 5 MG tablet Take 15 mg by mouth 3 (three) times daily with meals.  06/02/16  Yes [provider]  sevelamer carbonate (RENVELA) 800 MG tablet Take 800 mg by mouth 3 (three) times daily with meals.    Yes [provider]  traMADol (ULTRAM) 50 MG tablet Take 50 mg by mouth as needed for pain. 11/08/17  Yes [provider]  levothyroxine (SYNTHROID, LEVOTHROID) 200 MCG tablet Take 1 tablet (200 mcg total) by mouth every morning. Patient not taking: Reported on 11/26/2018 01/10/16   Brunetta Jeans, PA-C     Critical care time:      Doristine Mango, DO Family medicine PGY-2

## 2018-12-07 ENCOUNTER — Inpatient Hospital Stay (HOSPITAL_COMMUNITY): Payer: Medicare Other

## 2018-12-07 DIAGNOSIS — J9611 Chronic respiratory failure with hypoxia: Secondary | ICD-10-CM

## 2018-12-07 DIAGNOSIS — R627 Adult failure to thrive: Secondary | ICD-10-CM

## 2018-12-07 LAB — RENAL FUNCTION PANEL
Albumin: 2.4 g/dL — ABNORMAL LOW (ref 3.5–5.0)
Albumin: 2.5 g/dL — ABNORMAL LOW (ref 3.5–5.0)
Anion gap: 11 (ref 5–15)
Anion gap: 10 (ref 5–15)
BUN: 26 mg/dL — ABNORMAL HIGH (ref 8–23)
BUN: 19 mg/dL (ref 8–23)
CO2: 24 mmol/L (ref 22–32)
CO2: 24 mmol/L (ref 22–32)
Calcium: 7.8 mg/dL — ABNORMAL LOW (ref 8.9–10.3)
Calcium: 7.7 mg/dL — ABNORMAL LOW (ref 8.9–10.3)
Chloride: 100 mmol/L (ref 98–111)
Chloride: 102 mmol/L (ref 98–111)
Creatinine, Ser: 1.76 mg/dL — ABNORMAL HIGH (ref 0.61–1.24)
Creatinine, Ser: 1.22 mg/dL (ref 0.61–1.24)
GFR calc Af Amer: 47 mL/min — ABNORMAL LOW (ref >60)
GFR calc Af Amer: >60 mL/min (ref >60)
GFR calc non Af Amer: 41 mL/min — ABNORMAL LOW (ref >60)
GFR calc non Af Amer: >60 mL/min (ref >60)
Glucose, Bld: 176 mg/dL — ABNORMAL HIGH (ref 70–99)
Glucose, Bld: 149 mg/dL — ABNORMAL HIGH (ref 70–99)
Phosphorus: 1.4 mg/dL — ABNORMAL LOW (ref 2.5–4.6)
Phosphorus: 1.2 mg/dL — ABNORMAL LOW (ref 2.5–4.6)
Potassium: 4 mmol/L (ref 3.5–5.1)
Potassium: 4 mmol/L (ref 3.5–5.1)
Sodium: 135 mmol/L (ref 135–145)
Sodium: 136 mmol/L (ref 135–145)

## 2018-12-07 LAB — CBC
HCT: 29.6 % — ABNORMAL LOW (ref 39.0–52.0)
Hemoglobin: 8.2 g/dL — ABNORMAL LOW (ref 13.0–17.0)
MCH: 25.2 pg — ABNORMAL LOW (ref 26.0–34.0)
MCHC: 27.7 g/dL — ABNORMAL LOW (ref 30.0–36.0)
MCV: 90.8 fL (ref 80.0–100.0)
Platelets: 133 10*3/uL — ABNORMAL LOW (ref 150–400)
RBC: 3.26 MIL/uL — ABNORMAL LOW (ref 4.22–5.81)
RDW: 17.9 % — ABNORMAL HIGH (ref 11.5–15.5)
WBC: 18.6 10*3/uL — ABNORMAL HIGH (ref 4.0–10.5)
nRBC: 0.8 % — ABNORMAL HIGH (ref 0.0–0.2)

## 2018-12-07 LAB — MAGNESIUM: Magnesium: 2 mg/dL (ref 1.7–2.4)

## 2018-12-07 LAB — GLUCOSE, CAPILLARY
Glucose-Capillary: 139 mg/dL — ABNORMAL HIGH (ref 70–99)
Glucose-Capillary: 144 mg/dL — ABNORMAL HIGH (ref 70–99)
Glucose-Capillary: 146 mg/dL — ABNORMAL HIGH (ref 70–99)
Glucose-Capillary: 155 mg/dL — ABNORMAL HIGH (ref 70–99)
Glucose-Capillary: 160 mg/dL — ABNORMAL HIGH (ref 70–99)
Glucose-Capillary: 168 mg/dL — ABNORMAL HIGH (ref 70–99)

## 2018-12-07 LAB — PROCALCITONIN: Procalcitonin: 4.06 ng/mL

## 2018-12-07 MED ORDER — SODIUM PHOSPHATES 45 MMOLE/15ML IV SOLN
10.0000 mmol | Freq: Once | INTRAVENOUS | Status: AC
  Administered 2018-12-07: 10:00:00 10 mmol via INTRAVENOUS
  Filled 2018-12-07: qty 3.33

## 2018-12-07 MED ORDER — FENTANYL 40 MCG/ML IV SOLN
INTRAVENOUS | Status: DC
  Administered 2018-12-07: 12:00:00 via INTRAVENOUS
  Filled 2018-12-07 (×2): qty 25

## 2018-12-07 MED ORDER — SODIUM CHLORIDE 0.9 % IV SOLN
1.0000 mg/h | INTRAVENOUS | Status: DC
  Administered 2018-12-07: 1 mg/h via INTRAVENOUS
  Filled 2018-12-07: qty 5

## 2018-12-07 MED ORDER — SODIUM CHLORIDE 0.9% FLUSH: 9.0000 mL | INTRAVENOUS | Status: DC | PRN

## 2018-12-07 MED ORDER — DIPHENHYDRAMINE HCL 12.5 MG/5ML PO ELIX: 12.5000 mg | ORAL_SOLUTION | Freq: Four times a day (QID) | ORAL | Status: DC | PRN

## 2018-12-07 MED ORDER — NALOXONE HCL 0.4 MG/ML IJ SOLN: 0.4000 mg | INTRAMUSCULAR | Status: DC | PRN

## 2018-12-07 MED ORDER — ONDANSETRON HCL 4 MG/2ML IJ SOLN: 4.0000 mg | Freq: Four times a day (QID) | INTRAMUSCULAR | Status: DC | PRN

## 2018-12-07 MED ORDER — HYDROMORPHONE BOLUS VIA INFUSION
0.5000 mg | INTRAVENOUS | Status: DC | PRN
  Filled 2018-12-07: qty 1

## 2018-12-07 MED ORDER — DIPHENHYDRAMINE HCL 50 MG/ML IJ SOLN: 12.5000 mg | Freq: Four times a day (QID) | INTRAMUSCULAR | Status: DC | PRN

## 2018-12-07 MED ORDER — CHLORHEXIDINE GLUCONATE 0.12 % MT SOLN
OROMUCOSAL | Status: AC
  Filled 2018-12-07: qty 15

## 2018-12-07 NOTE — Progress Notes (Signed)
NAME:  [name], MRN:  [id number], DOB:  08-05-1955, LOS: 57 ADMISSION DATE:  11/10/2018, CONSULTATION DATE:  10/8 REFERRING MD:  Dr, Tawanna Solo, Amrit , CHIEF COMPLAINT:  Acute on chronic respiratory failure   Brief History   Patient is a 20-year male with history of ESRD on HD, peripheral vascular disease s/p left AKA, R femur fx, diabetes, morbid obesity, HTN, HLD who presented with infected sacral ulcer.  Patient is largely bedbound and sometimes ambulates with a wheelchair. He reported developing a small ulcer about 4 weeks ago which has now progressed to stage IV sacral decubitus ulcer. There was no report of fever or chills at home.  Patient was admitted for the management of decubitus ulcer.   Past Medical History  Type 2 diabetes mellitus with diabetic nephropathy, with long-term current use of insulin (HCC)   OBESITY   Essential hypertension   End stage renal disease (HCC)   Chronic venous insufficiency   Unstageable pressure ulcer of right buttock (HCC)   Closed fracture of distal end of right femur (HCC)   S/P AKA (above knee amputation) unilateral, left ([place])   Decubitus ulcer of sacral region, stage 4 ([place])   DNR (do not resuscitate) discussion   Chronic respiratory failure with hypoxia ([place])   Adult failure to thrive  Significant Hospital Events   10/8 intubated 10/9 - HD cath Rt IJ and started CRRT. S/p R Thora 580 cc fluid - transudate with LDH 65 in pleural fluid. Full codfe. Wife wanting aggressive care  Consults:  PCCM Ortho surgery General surgery Palliative care nephrology  Procedures:  Dialysis Echo  Debridement   Significant Diagnostic Tests:  10/8- CTA chest 9/30-CT  Micro Data:  10/8 Blood Cx  Antimicrobials:  9/27 vancomycin 9/27 cefepime     Interim history/subjective:    10/10 - lart stage 4 sacral decub continues. On fent gtt 163mcg and still not controlling pain fully. Needing prn. No benzo gtt. No precedex gtt. No diprivan  gtt. On Vent - doing PSV. No delirium per RN. On CRRT + since yesterday and s/p thora 580cc transudate  . On levophed 62mcg. C/o significant pain in sacral area  Chart reviw shows RLE chronic venous ulceration followed by Dr Sharol Given   Objective   Blood pressure (!) 84/57, pulse 95, temperature 97.6 F (36.4 C), temperature source Axillary, resp. rate 13, height 5\' 5"  (1.651 m), weight 124.5 kg, SpO2 100 %.    Vent Mode: PSV;CPAP FiO2 (%):  [40 %] 40 % Set Rate:  [16 bmp] 16 bmp Vt Set:  [490 mL] 490 mL PEEP:  [5 cmH20-8 cmH20] 5 cmH20 Pressure Support:  [10 cmH20] 10 cmH20 Plateau Pressure:  [19 cmH20-31 cmH20] 31 cmH20   Intake/Output Summary (Last 24 hours) at 12/07/2018 0839 Last data filed at 12/07/2018 0800 Gross per 24 hour  Intake 1828.83 ml  Output 3108 ml  Net -1279.17 ml   Filed Weights   12/05/18 0421 12/06/18 0421 12/07/18 0359  Weight: 123.3 kg 123 kg 124.5 kg   General Appearance:  Looks criticall ill OBESE - + Head:  Normocephalic, without obvious abnormality, atraumatic Eyes:  PERRL - yes, conjunctiva/corneas - muddy     Ears:  Normal external ear canals, both ears Nose:  G tube - no Throat:  ETT TUBE - yes , OG tube - yes Neck:  Supple,  No enlargement/tenderness/nodules Lungs: Clear to auscultation bilaterally, Ventilator   Synchrony - yes Heart:  S1 and S2 normal, no murmur, CVP -  x.  Pressors - 17mcg + Abdomen:  Soft, no masses, no organomegaly Genitalia / Rectal:  Not done Extremities:  Extremities- Left AKA, RLE - chronic ischemia with venous ulceration  Skin:  ntact in exposed areas . Sacral area - NOT EXAMINED BUT HAS STAGE 4 sacral decub Neurologic:  Sedation - fent gg -> RASS - +1 . Moves all 4s - yes. CAM-ICU - neg . Orientation - follows commands      Resolved Hospital Problem list     Assessment & Plan:  [name] is a 63yo male presenting to PCCM for management of intubation and vent settings.    Baseline is 4L [place]. H/o OSA, pulm HTN,  HFrEF, cardiorenal syndrome. C Acute on chronic hypoxic respiratory failure due to pain meds and R > L effusion and LLL consolidation - intubaed 12/05/2018   12/07/2018 - doing SBT for acute resp failure but does not meet extubation criteria due to shock, fent gtt needs due tp pain. S/p thora yesterday and fluid c/w transudate   PLAN  - PRVC  - No extubation 12/07/2018  Chronic pain and sedation needs on ventilator  12/07/2018 =- hobbled by chronic pain in sacrum and requesting siginficant fentanyl. Not in delirium  Plan - attempt to try PCA and wean off fent gtt    Sepsis/septic shock: - Source likely sacral ulcers and/or abdominal abscess as seen on CT 9/30 and/or urostomy and LL consolidation.   12/07/2018  - levophed 50mcg  plan - continue cefepime - continue vancomycin - Continue midodrine TID  Stage IV sacral decubitus ulcer: Underwent bedside debridement on 11/17/2018 by general surgery.   - continue antibiotics - continue wound care including hydrotherapy - continue CCS - continue pain management - PT/OT if appropriate  RLE chronic venous ulceration - followed by Dr Ian Bushman  12/07/2018 - appears chronic  Plan  - ? Needs amputation - consult ortho during week  ESRD on hemodialysis: Dialyzed on TTS.  Nephrology following and is on dialysis. Chronic  Dialysis being difficult due to hypotension and CRRT probably of value only as time limited trial per renal  12/07/2018 - on CRRT since 12/06/2018   Plan - continue CRRT - per renal   Diabetes type 2:   PLNA - sSSI - 20u lantus daily.  Hypothyroidism: chronic. - Continue Synthyroid  Chronic normocytic anemia and anemia of renal and critical illness: Associated with ESRD.    12/07/2018 - no obvious bleeding  Plan - - PRBC for hgb </= 6.9gm%    - exceptions are   -  if ACS susepcted/confirmed then transfuse for hgb </= 8.0gm%,  or    -  active bleeding with hemodynamic instability, then  transfuse regardless of hemoglobin value   At at all times try to transfuse 1 unit prbc as possible with exception of active hemorrhage  - continue aranesp per nephro   Goals of care: Palliative care was following.  Palliative care discussed with family. Wife wanted to continue full scope of treatment. Remains full code.   Best practice:  Diet: OG 40/hr Pain/Anxiety/Delirium protocol (if indicated): fentanyl VAP protocol (if indicated): in place DVT prophylaxis: hep [place] GI prophylaxis: PPI, zofran Glucose control: 20u lantus + sSSI Mobility: bed rest Code Status: full Family Communication: wife at bedside on 12/06/2018. None at bedside 12/07/2018 Disposition: ICU (might be suitable for LTAC depending on course)     ATTESTATION & SIGNATURE   The patient SUDAIS WIDMEYER is critically ill with multiple organ  systems failure and requires high complexity decision making for assessment and support, frequent evaluation and titration of therapies, application of advanced monitoring technologies and extensive interpretation of multiple databases.   Critical Care Time devoted to patient care services described in this note is  30  Minutes. This time reflects time of care of this signee Dr Brand Males. This critical care time does not reflect procedure time, or teaching time or supervisory time of PA/NP/Med student/Med Resident etc but could involve care discussion time     Dr. Brand Males, M.D., [facility].C.P Pulmonary and Critical Care Medicine Staff Physician [facility] Pager: [phone], If no answer or between  15:00h - 7:00h: call [phone]  12/07/2018 8:40 AM    LABS    PULMONARY Recent Labs  Lab 12/03/18 1335 12/05/18 1156  PHART 7.359 7.322*  PCO2ART 52.4* 57.4*  PO2ART 60.0* 107.0  HCO3 28.8* 30.2*  TCO2  --  32  O2SAT 90.0 98.0    CBC Recent Labs  Lab 12/06/18 0252 12/06/18 1255 12/07/18 0352  HGB  7.7* 8.1* 8.2*  HCT 28.0* 28.3* 29.6*  WBC 20.6* 19.3* 18.6*  PLT 148* 141* 133*    COAGULATION No results for input(s): INR in the last 168 hours.  CARDIAC  No results for input(s): TROPONINI in the last 168 hours. No results for input(s): PROBNP in the last 168 hours.   CHEMISTRY Recent Labs  Lab 12/02/18 0441  12/04/18 0332 12/05/18 0705 12/05/18 1118 12/05/18 1156 12/06/18 0252 12/06/18 1634 12/07/18 0352  NA  --    < > 131* 135 134* 136 135 135 135  K  --    < > 4.3 3.6 4.7 3.9 3.7 4.0 4.0  CL  --    < > 94* 98 99  --  100 101 100  CO2  --    < > 26 24 27   --  24 20* 24  GLUCOSE  --    < > 146* 123* 113*  --  139* 191* 176*  BUN  --    < > 33* 20 21  --  29* 32* 26*  CREATININE  --    < > 2.44* 1.84* 2.00*  --  2.43* 2.44* 1.76*  CALCIUM  --    < > 7.7* 8.6* 8.3*  --  8.1* 8.0* 7.8*  MG 1.8  --   --   --  1.9  --   --   --  2.0  PHOS  --    < > 2.7  --  2.9  --  1.3* 1.8* 1.4*   < > = values in this interval not displayed.   Estimated Creatinine Clearance: 54.1 mL/min (A) (by C-G formula based on SCr of 1.76 mg/dL (H)).   LIVER Recent Labs  Lab 12/04/18 0332 12/05/18 1118 12/06/18 0252 12/06/18 1634 12/07/18 0352  ALBUMIN 2.2* 2.6* 2.3* 2.5* 2.4*     INFECTIOUS Recent Labs  Lab 12/05/18 1118 12/06/18 0252 12/07/18 0352  PROCALCITON 5.17 5.53 4.06     ENDOCRINE CBG (last 3)  Recent Labs    12/06/18 2342 12/07/18 0325 12/07/18 0713  GLUCAP 150* 160* 146*         IMAGING x48h  - image(s) personally visualized  -   highlighted in bold Dg Chest 1 View  Result Date: 12/06/2018 CLINICAL DATA:  63 year old male central line placement. EXAM: CHEST  1 VIEW COMPARISON:  12/05/2018 and earlier.  FINDINGS: Portable AP semi upright view at 1150 hours. The patient is mildly rotated to the right. Right IJ approach catheter tip projects at the level of the carina, SVC. ET tube in good position just below the clavicles. Enteric tube courses to the  left upper quadrant and is likely stable from yesterday. Confluent right greater than left lung base opacity has probably not significantly changed from the CTA yesterday. Stable cardiac size and mediastinal contours. IMPRESSION: 1. Right IJ approach venous catheter tip at the level of the SVC. No pneumothorax. 2. Otherwise stable chest from the CT yesterday with right greater than left lung base opacification. Electronically Signed   By: Genevie Ann M.D.   On: 12/06/2018 12:19   Dg Chest 1 View  Result Date: 12/05/2018 CLINICAL DATA:  Shortness of breath, intubation EXAM: CHEST  1 VIEW COMPARISON:  12/03/2018 FINDINGS: Interval endotracheal intubation, tip positioned over the mid trachea. Low volume examination with extensive, new perihilar heterogeneous airspace opacity of the right lung and an unchanged focal opacity of the peripheral left lower lung. Cardiomegaly, this appearance likely exaggerated by low volume technique. IMPRESSION: 1. Interval endotracheal intubation, tip positioned over the mid trachea. 2. Low volume examination with extensive, new perihilar heterogeneous airspace opacity of the right lung, which may reflect asymmetric edema or infection, and an unchanged focal opacity of the peripheral left lower lung. 3. Cardiomegaly, this appearance likely exaggerated by low volume technique. Electronically Signed   By: Eddie Candle M.D.   On: 12/05/2018 10:10   Dg Abd 1 View  Result Date: 12/05/2018 CLINICAL DATA:  NG tube placement. EXAM: ABDOMEN - 1 VIEW COMPARISON:  CT scan 11/27/2018 FINDINGS: There is an NG tube coursing down the esophagus and into the stomach. The tip is in the body region. The endotracheal tube is in good position, 3.7 cm above the carina. Persistent bilateral infiltrates. IMPRESSION: NG tube tip is in the body region of the stomach. Electronically Signed   By: Marijo Sanes M.D.   On: 12/05/2018 12:49   Ct Angio Chest Pe W Or Wo Contrast  Result Date: 12/05/2018 CLINICAL  DATA:  63 year old male with recent cardiac arrest. Dialysis. EXAM: CT ANGIOGRAPHY CHEST WITH CONTRAST TECHNIQUE: Multidetector CT imaging of the chest was performed using the standard protocol during bolus administration of intravenous contrast. Multiplanar CT image reconstructions and MIPs were obtained to evaluate the vascular anatomy. CONTRAST:  59mL OMNIPAQUE IOHEXOL 350 MG/ML SOLN COMPARISON:  Portable chest today. CTA chest 11/15/2007. CT Abdomen and Pelvis 11/27/2018. FINDINGS: Cardiovascular: Good contrast bolus timing in the pulmonary arterial tree. No focal filling defect identified in the pulmonary arteries to suggest acute pulmonary embolism. Calcified coronary artery atherosclerosis. Little contrast in the aorta today. Mild cardiomegaly without pericardial effusion. Mediastinum/Nodes: Enteric tube courses in the esophagus to the abdomen. No lymphadenopathy. Lungs/Pleura: Intubated, ET tube terminates above the carina. Atelectatic changes on the carina and other major airways which do remain patent. Increased bilateral pleural effusions, with a mix of layering and sub pulmonic fluid. Chronic right lower lobe consolidation. New left lower lobe consolidation. There is confluent peribronchial opacity in the anterior right upper lobe, with superimposed posterior right upper lobe consolidation. The left upper lobe is relatively spared but there is patchy perihilar opacity on the left. Linear opacity in the lingula more resembles atelectasis. The right middle lobe is relatively spared. Upper Abdomen: Enteric tube terminates in the proximal gastric body. There is a small 9 millimeter square or round object adjacent to the  tube which seems to be an ingested foreign body on series 5, image 110. Otherwise stable visible upper abdomen from the recent CT Abdomen and Pelvis on 11/27/2018. Musculoskeletal: Osteopenia and widespread ankylosis in the spine. Scoliosis. Partially visible right upper extremity vascular  stent or graft. No acute osseous abnormality identified. Review of the MIP images confirms the above findings. IMPRESSION: 1. No evidence of acute pulmonary embolus. 2. Increased bilateral pulmonary consolidation and pleural effusions since 11/27/2018. Bilateral Pneumonia strongly suspected, although some of the affected lung might be simply atelectatic. 3. Enteric tube courses to the stomach and there appears to be a small ingested 9 mm foreign body adjacent to the tube on series 5, image 110. 4. ET tube terminates above the carina with atelectatic changes to the more distal airways. Electronically Signed   By: Genevie Ann M.D.   On: 12/05/2018 13:32   Dg Chest Port 1 View  Result Date: 12/06/2018 CLINICAL DATA:  Status post right thoracentesis today. EXAM: PORTABLE CHEST 1 VIEW COMPARISON:  Single-view of the chest earlier today. FINDINGS: Support tubes and lines are unchanged. Right pleural effusion is markedly decreased after thoracentesis. Negative for pneumothorax. Subsegmental atelectasis in the bases noted. There is cardiomegaly. IMPRESSION: Marked decrease in right pleural effusion after thoracentesis. Negative for pneumothorax. Bibasilar subsegmental atelectasis. Electronically Signed   By: Inge Rise M.D.   On: 12/06/2018 17:28   US Thoracentesis Asp Pleural Space W/img Guide  Result Date: 12/06/2018 INDICATION: Respiratory failure. Right-sided pleural effusion. Request for diagnostic and therapeutic thoracentesis. EXAM: ULTRASOUND GUIDED RIGHT THORACENTESIS MEDICATIONS: None. COMPLICATIONS: None immediate. PROCEDURE: An ultrasound guided thoracentesis was thoroughly discussed with the patient and questions answered. The benefits, risks, alternatives and complications were also discussed. The patient understands and wishes to proceed with the procedure. Written consent was obtained. Ultrasound was performed to localize and mark an adequate pocket of fluid in the right chest. The area was then  prepped and draped in the normal sterile fashion. 1% Lidocaine was used for local anesthesia. Under ultrasound guidance a 6 Fr Safe-T-Centesis catheter was introduced. Thoracentesis was performed. The catheter was removed and a dressing applied. FINDINGS: A total of approximately 580 mL of clear yellow fluid was removed. Samples were sent to the laboratory as requested by the clinical team. IMPRESSION: Successful ultrasound guided right thoracentesis yielding 580 mL of pleural fluid. Read by: Ascencion Dike PA-C Electronically Signed   By: Corrie Mckusick D.O.   On: 12/06/2018 16:31

## 2018-12-07 NOTE — Progress Notes (Signed)
Assessed LUE for PIV per consult request. LUE reddened, edematous, weeping, bruised.  Not suitable for PIV in LUE.  RUE restricted for CKD.  Saralyn Pilar RN at bedside aware.  Ray Lynnae Prude RN IV Team also at bedside, assessed LUE, in agreement unsuitable for veins.

## 2018-12-07 NOTE — Progress Notes (Signed)
Wasted 25 mL of fentanyl is narcotic waste bin from PCA pump attempt.  Pharmacy is aware that we were unable to initiate.  Wasted fentanyl with charge nurse, Daron Offer RN.

## 2018-12-07 NOTE — Progress Notes (Signed)
Physical Therapy Wound Treatment Patient Details  Name: [name] MRN: [id number] Date of Birth: 1956/02/15  Today's Date: 12/07/2018 Time: 0905-0950 Time Calculation (min): 45 min  Subjective  Subjective: Pt initially writing for PT to "leave," then writes "funny, haha." Once educated, then mouths "okay." Patient and Family Stated Goals: None stated.  Date of Onset: (Unknown) Prior Treatments: Bedside debridement 9/28  Pain Score: On Fentanyl drip  Wound Assessment  Wound / Incision (Open or Dehisced) 11/25/18 Other (Comment) Sacrum Mid (Active)  Dressing Type ABD;Moist to dry;Gauze (Comment);Barrier Film (skin prep) 12/07/18 1233  Dressing Changed Changed 12/07/18 0400  Dressing Status Clean;Dry;Intact 12/07/18 1233  Dressing Change Frequency Daily 12/07/18 1233  Site / Wound Assessment Pink;Red;Yellow;Brown 12/07/18 1233  % Wound base Red or Granulating 30% 12/07/18 1233  % Wound base Yellow/Fibrinous Exudate 50% 12/07/18 1233  % Wound base Black/Eschar 20% 12/07/18 1233  % Wound base Other/Granulation Tissue (Comment) 0% 12/07/18 1233  Peri-wound Assessment Intact;Pink;Induration 12/07/18 1233  Wound Length (cm) 15 cm 12/04/18 1119  Wound Width (cm) 12 cm 12/04/18 1119  Wound Depth (cm) 5 cm 12/05/18 0600  Wound Volume (cm^3) 756 cm^3 12/04/18 1119  Wound Surface Area (cm^2) 180 cm^2 12/04/18 1119  Undermining (cm) 2.8 cm from about 6:00-12:00 11/26/18 1000  Margins Unattached edges (unapproximated) 12/07/18 1233  Closure None 12/07/18 1233  Drainage Amount Copious 12/07/18 1233  Drainage Description Purulent;Odor;Serosanguineous 12/07/18 1233  Treatment Debridement (Selective);Hydrotherapy (Pulse lavage);Packing (Saline gauze) 12/07/18 1233   Hydrotherapy Pulsed lavage therapy - wound location: Sacrum Pulsed Lavage with Suction (psi): 12 psi(8-12) Pulsed Lavage with Suction - Normal Saline Used: 1000 mL Pulsed Lavage Tip: Tip with splash shield Selective  Debridement Selective Debridement - Location: Sacrum Selective Debridement - Tools Used: Forceps;Scalpel;Scissors Selective Debridement - Tissue Removed: yellow and brown necrotic tissue   Wound Assessment and Plan  Wound Therapy - Assess/Plan/Recommendations Wound Therapy - Clinical Statement: Decreased green drainage noted. Will continue to follow for selective removal of unviable tissue, to decrease bioburden and promote wound bed healing.  Wound Therapy - Functional Problem List: Decreased mobility, AKA, global weakness.  Factors Delaying/Impairing Wound Healing: Diabetes Mellitus;Immobility;Multiple medical problems Hydrotherapy Plan: Debridement;Dressing change;Patient/family education;Pulsatile lavage with suction Wound Therapy - Frequency: 6X / week Wound Therapy - Follow Up Recommendations: Skilled nursing facility Wound Plan: See above  Wound Therapy Goals- Improve the function of patient's integumentary system by progressing the wound(s) through the phases of wound healing (inflammation - proliferation - remodeling) by: Decrease Necrotic Tissue to: 20% Decrease Necrotic Tissue - Progress: Progressing toward goal Increase Granulation Tissue to: 80% Increase Granulation Tissue - Progress: Progressing toward goal Goals/treatment plan/discharge plan were made with and agreed upon by patient/family: Yes Time For Goal Achievement: 7 days Wound Therapy - Potential for Goals: Fair  Goals will be updated until maximal potential achieved or discharge criteria met.  Discharge criteria: when goals achieved, discharge from hospital, MD decision/surgical intervention, no progress towards goals, refusal/missing three consecutive treatments without notification or medical reason.  GP     Ellamae Sia, PT, DPT Acute Rehabilitation Services Pager [phone] Office [phone]  Willy Eddy 12/07/2018, 12:37 PM

## 2018-12-07 NOTE — Progress Notes (Signed)
RT note- Placed back to full support for hydrotherapy, continue to monitor and wean as tolerated.

## 2018-12-07 NOTE — Progress Notes (Signed)
[facility] Progress Note   Subjective: s/p thora, transudative effusion.  On CRRT pulling about 100 mL/ hr.  S/p hydrotherapy this AM.    Objective Vitals:   12/07/18 0932 12/07/18 1000 12/07/18 1019 12/07/18 1100  BP:  92/74  (!) 80/65  Pulse:  86 96 89  Resp:  _0 Temp:      TempSrc:      SpO2: (!) 75% 100% 98% 100%  Weight:      Height:       Physical Exam General: Obese, chronically ill appearing male, intubated, alert Heart: S1,S2 RRR Lungs: mechanical breath sounds bilaterally Abdomen: Active BS, obese, NT, subcutaneous bruising  Extremities: L AKA with 2+ stump edema. LLE 1+ edema. Dialysis Access: R AVF + bruit, aneurysmal SKIN: large stage IV sacral decub, just finishing up hydrotherapy    Additional Objective Labs: Basic Metabolic Panel: Recent Labs  Lab 12/06/18 0252 12/06/18 1634 12/07/18 0352  NA 135 135 135  K 3.7 4.0 4.0  CL 100 101 100  CO2 24 20* 24  GLUCOSE 139* 191* 176*  BUN 29* 32* 26*  CREATININE 2.43* 2.44* 1.76*  CALCIUM 8.1* 8.0* 7.8*  PHOS 1.3* 1.8* 1.4*   Liver Function Tests: Recent Labs  Lab 12/06/18 0252 12/06/18 1634 12/07/18 0352  ALBUMIN 2.3* 2.5* 2.4*   No results for input(s): LIPASE, AMYLASE in the last 168 hours. CBC: Recent Labs  Lab 12/04/18 0332 12/05/18 0705  12/06/18 0252 12/06/18 1255 12/07/18 0352  WBC 17.8* 23.2*  --  20.6* 19.3* 18.6*  NEUTROABS  --  21.1*  --   --   --   --   HGB 8.5* 8.8*   < > 7.7* 8.1* 8.2*  HCT 30.7* 32.3*   < > 28.0* 28.3* 29.6*  MCV 90.0 92.0  --  90.6 88.7 90.8  PLT 126* 135*  --  148* 141* 133*   < > = values in this interval not displayed.   Blood Culture    Component Value Date/Time   SDES FLUID PLEURAL 12/06/2018 1619   SPECREQUEST NONE 12/06/2018 1619   CULT  12/05/2018 1140    NO GROWTH < 24 HOURS Performed at [facility], [place] [address], [place], [place] [address]    REPTSTATUS 12/06/2018 FINAL 12/06/2018 1619    Cardiac  Enzymes: No results for input(s): CKTOTAL, CKMB, CKMBINDEX, TROPONINI in the last 168 hours. CBG: Recent Labs  Lab 12/06/18 1523 12/06/18 1908 12/06/18 2342 12/07/18 0325 12/07/18 0713  GLUCAP 188* 170* 150* 160* 146*   Iron Studies: No results for input(s): IRON, TIBC, TRANSFERRIN, FERRITIN in the last 72 hours. _1 @ Studies/Results: Dg Chest 1 View  Result Date: 12/06/2018 CLINICAL DATA:  63 year old male central line placement. EXAM: CHEST  1 VIEW COMPARISON:  12/05/2018 and earlier. FINDINGS: Portable AP semi upright view at 1150 hours. The patient is mildly rotated to the right. Right IJ approach catheter tip projects at the level of the carina, SVC. ET tube in good position just below the clavicles. Enteric tube courses to the left upper quadrant and is likely stable from yesterday. Confluent right greater than left lung base opacity has probably not significantly changed from the CTA yesterday. Stable cardiac size and mediastinal contours. IMPRESSION: 1. Right IJ approach venous catheter tip at the level of the SVC. No pneumothorax. 2. Otherwise stable chest from the CT yesterday with right greater than left lung base opacification. Electronically Signed   By: Herminio Heads.D.  On: 12/06/2018 12:19   Dg Abd 1 View  Result Date: 12/05/2018 CLINICAL DATA:  NG tube placement. EXAM: ABDOMEN - 1 VIEW COMPARISON:  CT scan 11/27/2018 FINDINGS: There is an NG tube coursing down the esophagus and into the stomach. The tip is in the body region. The endotracheal tube is in good position, 3.7 cm above the carina. Persistent bilateral infiltrates. IMPRESSION: NG tube tip is in the body region of the stomach. Electronically Signed   By: Marijo Sanes M.D.   On: 12/05/2018 12:49   Ct Angio Chest Pe W Or Wo Contrast  Result Date: 12/05/2018 CLINICAL DATA:  63 year old male with recent cardiac arrest. Dialysis. EXAM: CT ANGIOGRAPHY CHEST WITH CONTRAST TECHNIQUE: Multidetector CT imaging of the  chest was performed using the standard protocol during bolus administration of intravenous contrast. Multiplanar CT image reconstructions and MIPs were obtained to evaluate the vascular anatomy. CONTRAST:  66m OMNIPAQUE IOHEXOL 350 MG/ML SOLN COMPARISON:  Portable chest today. CTA chest 11/15/2007. CT Abdomen and Pelvis 11/27/2018. FINDINGS: Cardiovascular: Good contrast bolus timing in the pulmonary arterial tree. No focal filling defect identified in the pulmonary arteries to suggest acute pulmonary embolism. Calcified coronary artery atherosclerosis. Little contrast in the aorta today. Mild cardiomegaly without pericardial effusion. Mediastinum/Nodes: Enteric tube courses in the esophagus to the abdomen. No lymphadenopathy. Lungs/Pleura: Intubated, ET tube terminates above the carina. Atelectatic changes on the carina and other major airways which do remain patent. Increased bilateral pleural effusions, with a mix of layering and sub pulmonic fluid. Chronic right lower lobe consolidation. New left lower lobe consolidation. There is confluent peribronchial opacity in the anterior right upper lobe, with superimposed posterior right upper lobe consolidation. The left upper lobe is relatively spared but there is patchy perihilar opacity on the left. Linear opacity in the lingula more resembles atelectasis. The right middle lobe is relatively spared. Upper Abdomen: Enteric tube terminates in the proximal gastric body. There is a small 9 millimeter square or round object adjacent to the tube which seems to be an ingested foreign body on series 5, image 110. Otherwise stable visible upper abdomen from the recent CT Abdomen and Pelvis on 11/27/2018. Musculoskeletal: Osteopenia and widespread ankylosis in the spine. Scoliosis. Partially visible right upper extremity vascular stent or graft. No acute osseous abnormality identified. Review of the MIP images confirms the above findings. IMPRESSION: 1. No evidence of acute  pulmonary embolus. 2. Increased bilateral pulmonary consolidation and pleural effusions since 11/27/2018. Bilateral Pneumonia strongly suspected, although some of the affected lung might be simply atelectatic. 3. Enteric tube courses to the stomach and there appears to be a small ingested 9 mm foreign body adjacent to the tube on series 5, image 110. 4. ET tube terminates above the carina with atelectatic changes to the more distal airways. Electronically Signed   By: HGenevie AnnM.D.   On: 12/05/2018 13:32   Dg Chest Port 1 View  Result Date: 12/06/2018 CLINICAL DATA:  Status post right thoracentesis today. EXAM: PORTABLE CHEST 1 VIEW COMPARISON:  Single-view of the chest earlier today. FINDINGS: Support tubes and lines are unchanged. Right pleural effusion is markedly decreased after thoracentesis. Negative for pneumothorax. Subsegmental atelectasis in the bases noted. There is cardiomegaly. IMPRESSION: Marked decrease in right pleural effusion after thoracentesis. Negative for pneumothorax. Bibasilar subsegmental atelectasis. Electronically Signed   By: TInge RiseM.D.   On: 12/06/2018 17:28   UKoreaThoracentesis Asp Pleural Space W/img Guide  Result Date: 12/06/2018 INDICATION: Respiratory failure. Right-sided pleural effusion.  Request for diagnostic and therapeutic thoracentesis. EXAM: ULTRASOUND GUIDED RIGHT THORACENTESIS MEDICATIONS: None. COMPLICATIONS: None immediate. PROCEDURE: An ultrasound guided thoracentesis was thoroughly discussed with the patient and questions answered. The benefits, risks, alternatives and complications were also discussed. The patient understands and wishes to proceed with the procedure. Written consent was obtained. Ultrasound was performed to localize and mark an adequate pocket of fluid in the right chest. The area was then prepped and draped in the normal sterile fashion. 1% Lidocaine was used for local anesthesia. Under ultrasound guidance a 6 Fr Safe-T-Centesis  catheter was introduced. Thoracentesis was performed. The catheter was removed and a dressing applied. FINDINGS: A total of approximately 580 mL of clear yellow fluid was removed. Samples were sent to the laboratory as requested by the clinical team. IMPRESSION: Successful ultrasound guided right thoracentesis yielding 580 mL of pleural fluid. Read by: Ascencion Dike PA-C Electronically Signed   By: Corrie Mckusick D.O.   On: 12/06/2018 16:31   Medications: .  prismasol BGK 4/2.5 500 mL/hr at 12/06/18 2314  .  prismasol BGK 4/2.5 200 mL/hr at 12/06/18 1211  . ceFEPime (MAXIPIME) IV 2 g (12/07/18 0506)  . fentaNYL infusion INTRAVENOUS 150 mcg/hr (12/07/18 1100)  . norepinephrine (LEVOPHED) Adult infusion 4 mcg/min (12/07/18 1100)  . prismasol BGK 4/2.5 2,000 mL/hr at 12/07/18 1020  . sodium phosphate  Dextrose 5% IVPB 42 mL/hr at 12/07/18 1100  . vancomycin     . chlorhexidine gluconate (MEDLINE KIT)  15 mL Mouth Rinse BID  . Chlorhexidine Gluconate Cloth  6 each Topical Q0600  . collagenase   Topical Daily  . darbepoetin (ARANESP) injection - DIALYSIS  200 mcg Intravenous Q Sat-HD  . feeding supplement (PRO-STAT SUGAR FREE 64)  60 mL Per Tube TID  . feeding supplement (VITAL 1.5 CAL)  1,000 mL Per Tube Q24H  . fentaNYL   Intravenous Q4H  . heparin  5,000 Units Subcutaneous Q8H  . insulin aspart  0-9 Units Subcutaneous Q4H  . insulin glargine  20 Units Subcutaneous QHS  . levothyroxine  125 mcg Per NG tube Q0600  . mouth rinse  15 mL Mouth Rinse 10 times per day  . midodrine  15 mg Per NG tube TID WC  . pantoprazole sodium  40 mg Per Tube Daily     Dialysis Orders: [facility] [place] [facility]  3h 74mn 113kg (250 lbs) 2/2.5 bath  R AVF -No Heparin  -Mircera 200 mcg IV q 2 weeks last dose 8/27  Assessment/Plan:  1. Acute hypoxic RF: intubated, in ICU.  S/p thora with transudate.  Per PCCM 2. Stage IV sacral decubitus ulcer - Limited mobility. Mostly wheelchair/bedbound.  S/P bedside debridement per surgery 9/28. Hydrotherapy, Vanc, cefepime - per primary.[facility]  Refused dressing changes/ hydrotherapy, now back to getting it. 3. Abdominal Pain: CT shows small residual collection posterion schedule. r border of lived concerning for abscess. On Vanc/Cefepime.  4. ESRD -HD TTS. Continue on schedule. Had HD 10/06.  Low temp, got albumin.  Na 128 which notes vol overload, extra rx 10/7. On CRRT now for volume removal 5. Hypotension/volume: vol overloaded and hypotensive, on pressors and midodrine. 6. Anemia - Hgb 8.5 Rec'd Aranesp 100 mcg IV Follow HGB--> Increase to 200 q Saturday for 10/10  7. Metabolic bone disease -Phos low, stop renvela, getting Na phos today 8. Nutrition -Albumin 1.9. Prot supp for low albumin  9. DM -insulin per primary  10. S/p  cystectomy with urostomy Dispo:  Appreciate PCCM and palliative.  Full scope care.  Hopeful for d/c to [place] MD 12/07/2018, 11:17 AM  [place]

## 2018-12-07 NOTE — Progress Notes (Addendum)
NAME:  [name], MRN:  [id number], DOB:  1955/07/10, LOS: 58 ADMISSION DATE:  11/23/2018, CONSULTATION DATE:  10/8 REFERRING MD:  Dr, Tawanna Solo, Amrit , CHIEF COMPLAINT:  Acute on chronic respiratory failure   Brief History   Patient is a 71-year male with history of ESRD on HD, peripheral vascular disease s/p left AKA, R femur fx, diabetes, morbid obesity, HTN, HLD who presented with infected sacral ulcer.  Patient is largely bedbound and sometimes ambulates with a wheelchair. He reported developing a small ulcer about 4 weeks ago which has now progressed to stage IV sacral decubitus ulcer. There was no report of fever or chills at home.  Patient was admitted for the management of decubitus ulcer.  History of present illness   10/8 patient had increasing hypoxia and lethargic requiring intubation. Unable to tolerate scheduled HD 2/2 hypotension. PCCM was consulted for management.   Past Medical History  Type 2 diabetes mellitus with diabetic nephropathy, with long-term current use of insulin (HCC)   OBESITY   Essential hypertension   End stage renal disease (HCC)   Chronic venous insufficiency   Unstageable pressure ulcer of right buttock (HCC)   Closed fracture of distal end of right femur (HCC)   S/P AKA (above knee amputation) unilateral, left ([place])   Decubitus ulcer of sacral region, stage 4 ([place])   DNR (do not resuscitate) discussion   Chronic respiratory failure with hypoxia ([place])   Adult failure to thrive  Significant Hospital Events   10/8 intubated  Consults:  PCCM Ortho surgery General surgery Palliative care nephrology  Procedures:  Dialysis Echo  Debridement   Significant Diagnostic Tests:  10/8- CTA chest 9/30-CT  Micro Data:  10/8 Blood Cx>> 12/06/2018 right pleural fluid>>  Antimicrobials:  9/27 vancomycin>> 9/27 cefepime>>  Interim history/subjective:  Awake alert no acute distress on 10 waiting protocol.  Objective   Blood pressure (!)  96/58, pulse 93, temperature 97.6 F (36.4 C), temperature source Axillary, resp. rate 18, height 5\' 5"  (1.651 m), weight 124.5 kg, SpO2 100 %.    Vent Mode: PRVC FiO2 (%):  [40 %] 40 % Set Rate:  [16 bmp] 16 bmp Vt Set:  [490 mL] 490 mL PEEP:  [5 cmH20-8 cmH20] 5 cmH20 Pressure Support:  [10 cmH20] 10 cmH20 Plateau Pressure:  [19 cmH20-31 cmH20] 31 cmH20   Intake/Output Summary (Last 24 hours) at 12/07/2018 0738 Last data filed at 12/07/2018 0700 Gross per 24 hour  Intake 1826.39 ml  Output 2948 ml  Net -1121.61 ml   Filed Weights   12/05/18 0421 12/06/18 0421 12/07/18 0359  Weight: 123.3 kg 123 kg 124.5 kg    Examination: General: Ill-appearing male who is awake alert follows commands HEENT: Endotracheal tube in place gastric tube in place Neuro: Alert and orientated follows commands moves all extremities x3  CV: Heart sounds are distant PULM: Diminished throughout GI: soft, bsx4 active, obese Extremities: Left AKA with edema, right lower extremity with ischemic changes Skin: Warm      Resolved Hospital Problem list     Assessment & Plan:  [name] is a 63yo male presenting to PCCM for management of intubation and vent settings.   Acute on chronic hypoxic respiratory failure: patient remains on vent at 40% FiO2 and 98% O2 sats. Baseline is 4L [place]. H/o OSA, pulm HTN, HFrEF, cardiorenal syndrome. CT 9/30 revealed new consolidation of LLL with air bronchograms with moderate right and small left pleural effusions and atelectasis. CTA chest 10/8  revealed no PE. still significant pleural effusion R>L -Placed on weaning protocol 12/07/2018 -Status post right thoracentesis with over 300 cc obtained  Sepsis/septic shock:  Source likely sacral ulcers and/or abdominal abscess as seen on CT 9/30 and/or urostomy. Blood cultures 10/8 pending. Deescalate treatment as able. Patient currently requiring levophed at 80mcg to maintain systolic [id number]. Patient was hypotensive overnight and  received 1L IV hydration as well as albumin. patiet has chronic hypotension on midodrine. Patient remains tachycardic this am. Fluid resuscitation cautiously with patient's fragile fluid status.  -Continue antimicrobial therapy -Surgery is following -I suspect this wound will never heal  Stage IV sacral decubitus ulcer: Underwent bedside debridement on 11/17/2018 by general surgery.   -Continue vancomycin and cefepime - c wound care -Pain management   ESRD on hemodialysis: Dialyzed on TTS.  Nephrology following and is on dialysis.  Dialysis being difficult due to hypotension. 50cc UOP yesterday. -Per nephrology  Diabetes type 2 CBG (last 3)  Recent Labs    12/06/18 2342 12/07/18 0325 12/07/18 0713  GLUCAP 150* 160* 146*   -Sliding scale insulin -Titrating Lantus to achieve goal  Hypothyroidism: chronic. -Continue Synthroid  Chronic normocytic anemia: Recent Labs    12/06/18 1255 12/07/18 0352  HGB 8.1* 8.2*     Associated with ESRD. Hgb decreased from 10.2 yesterday to 7.7 today. Patient remains on heparin [place] ppx without clear signs of bleeding. -Continue Ortho-Cept per nephrology -Continue to monitor CBC  Goals of care: Palliative care was following.  Palliative care discussed with family. Wife wanted to continue full scope of treatment. Remains full code  Best practice:  Diet: OG 40/hr Pain/Anxiety/Delirium protocol (if indicated): fentanyl VAP protocol (if indicated): in place DVT prophylaxis: hep [place] GI prophylaxis: PPI, zofran Glucose control:  lantus + sSSI Mobility: bed rest Code Status: full Family Communication: 12/07/2018 patient updated at bedside Disposition: ICU  Labs   CBC: Recent Labs  Lab 12/04/18 0332 12/05/18 0705 12/05/18 1156 12/06/18 0252 12/06/18 1255 12/07/18 0352  WBC 17.8* 23.2*  --  20.6* 19.3* 18.6*  NEUTROABS  --  21.1*  --   --   --   --   HGB 8.5* 8.8* 10.2* 7.7* 8.1* 8.2*  HCT 30.7* 32.3* 30.0* 28.0* 28.3* 29.6*   MCV 90.0 92.0  --  90.6 88.7 90.8  PLT 126* 135*  --  148* 141* 133*    Basic Metabolic Panel: Recent Labs  Lab 12/02/18 0441  12/04/18 0332 12/05/18 0705 12/05/18 1118 12/05/18 1156 12/06/18 0252 12/06/18 1634 12/07/18 0352  NA  --    < > 131* 135 134* 136 135 135 135  K  --    < > 4.3 3.6 4.7 3.9 3.7 4.0 4.0  CL  --    < > 94* 98 99  --  100 101 100  CO2  --    < > 26 24 27   --  24 20* 24  GLUCOSE  --    < > 146* 123* 113*  --  139* 191* 176*  BUN  --    < > 33* 20 21  --  29* 32* 26*  CREATININE  --    < > 2.44* 1.84* 2.00*  --  2.43* 2.44* 1.76*  CALCIUM  --    < > 7.7* 8.6* 8.3*  --  8.1* 8.0* 7.8*  MG 1.8  --   --   --  1.9  --   --   --  2.0  PHOS  --    < >  2.7  --  2.9  --  1.3* 1.8* 1.4*   < > = values in this interval not displayed.   GFR: Estimated Creatinine Clearance: 54.1 mL/min (A) (by C-G formula based on SCr of 1.76 mg/dL (H)). Recent Labs  Lab 12/05/18 0705 12/05/18 1118 12/06/18 0252 12/06/18 1255 12/07/18 0352  PROCALCITON  --  5.17 5.53  --  4.06  WBC 23.2*  --  20.6* 19.3* 18.6*    Liver Function Tests: Recent Labs  Lab 12/04/18 0332 12/05/18 1118 12/06/18 0252 12/06/18 1634 12/07/18 0352  ALBUMIN 2.2* 2.6* 2.3* 2.5* 2.4*   ABG    Component Value Date/Time   PHART 7.322 (L) 12/05/2018 1156   PCO2ART 57.4 (H) 12/05/2018 1156   PO2ART 107.0 12/05/2018 1156   HCO3 30.2 (H) 12/05/2018 1156   TCO2 32 12/05/2018 1156   ACIDBASEDEF 4.0 (H) 08/14/2012 1431   O2SAT 98.0 12/05/2018 1156    HbA1C: Hemoglobin A1C  Date/Time Value Ref Range Status  04/29/2015 5.3  Final   Hgb A1c MFr Bld  Date/Time Value Ref Range Status  03/09/2017 06:50 AM 5.1 4.8 - 5.6 % Final    Comment:    (NOTE) Pre diabetes:          5.7%-6.4% Diabetes:              >6.4% Glycemic control for   <7.0% adults with diabetes   03/20/2016 11:31 AM 5.3 4.6 - 6.5 % Final    Comment:    Glycemic Control Guidelines for People with Diabetes:Non Diabetic:  <6%Goal  of Therapy: <7%Additional Action Suggested:  >8%     CBG: Recent Labs  Lab 12/06/18 1523 12/06/18 1908 12/06/18 2342 12/07/18 0325 12/07/18 0713  GLUCAP 188* 170* 150* 160* 146*      App Critical care time: 34 min     [name] Minor ACNP Maryanna Shape PCCM Pager [phone] till 1 pm If no answer page [phone] 12/08/2018, 7:50 AM

## 2018-12-08 ENCOUNTER — Inpatient Hospital Stay (HOSPITAL_COMMUNITY): Payer: Medicare Other

## 2018-12-08 DIAGNOSIS — J9601 Acute respiratory failure with hypoxia: Secondary | ICD-10-CM

## 2018-12-08 DIAGNOSIS — Z66 Do not resuscitate: Secondary | ICD-10-CM

## 2018-12-08 DIAGNOSIS — J9621 Acute and chronic respiratory failure with hypoxia: Secondary | ICD-10-CM

## 2018-12-08 LAB — CBC WITH DIFFERENTIAL/PLATELET
Abs Immature Granulocytes: 0.67 10*3/uL — ABNORMAL HIGH (ref 0.00–0.07)
Basophils Absolute: 0.1 10*3/uL (ref 0.0–0.1)
Basophils Relative: 0 %
Eosinophils Absolute: 0.1 10*3/uL (ref 0.0–0.5)
Eosinophils Relative: 0 %
HCT: 29.3 % — ABNORMAL LOW (ref 39.0–52.0)
Hemoglobin: 7.6 g/dL — ABNORMAL LOW (ref 13.0–17.0)
Immature Granulocytes: 3 %
Lymphocytes Relative: 2 %
Lymphs Abs: 0.6 10*3/uL — ABNORMAL LOW (ref 0.7–4.0)
MCH: 24.8 pg — ABNORMAL LOW (ref 26.0–34.0)
MCHC: 25.9 g/dL — ABNORMAL LOW (ref 30.0–36.0)
MCV: 95.8 fL (ref 80.0–100.0)
Monocytes Absolute: 1.1 10*3/uL — ABNORMAL HIGH (ref 0.1–1.0)
Monocytes Relative: 4 %
Neutro Abs: 23.5 10*3/uL — ABNORMAL HIGH (ref 1.7–7.7)
Neutrophils Relative %: 91 %
Platelets: 88 10*3/uL — ABNORMAL LOW (ref 150–400)
RBC: 3.06 MIL/uL — ABNORMAL LOW (ref 4.22–5.81)
RDW: 18.7 % — ABNORMAL HIGH (ref 11.5–15.5)
WBC: 26.1 10*3/uL — ABNORMAL HIGH (ref 4.0–10.5)
nRBC: 0.7 % — ABNORMAL HIGH (ref 0.0–0.2)

## 2018-12-08 LAB — POCT I-STAT 7, (LYTES, BLD GAS, ICA,H+H)
Acid-base deficit: 5 mmol/L — ABNORMAL HIGH (ref 0.0–2.0)
Bicarbonate: 21.7 mmol/L (ref 20.0–28.0)
Calcium, Ion: 1.1 mmol/L — ABNORMAL LOW (ref 1.15–1.40)
HCT: 30 % — ABNORMAL LOW (ref 39.0–52.0)
Hemoglobin: 10.2 g/dL — ABNORMAL LOW (ref 13.0–17.0)
O2 Saturation: 95 %
Patient temperature: 98
Potassium: 3.7 mmol/L (ref 3.5–5.1)
Sodium: 137 mmol/L (ref 135–145)
TCO2: 23 mmol/L (ref 22–32)
pCO2 arterial: 47.7 mmHg (ref 32.0–48.0)
pH, Arterial: 7.266 — ABNORMAL LOW (ref 7.350–7.450)
pO2, Arterial: 87 mmHg (ref 83.0–108.0)

## 2018-12-08 LAB — RENAL FUNCTION PANEL
Albumin: 2.2 g/dL — ABNORMAL LOW (ref 3.5–5.0)
Albumin: 2.5 g/dL — ABNORMAL LOW (ref 3.5–5.0)
Anion gap: 12 (ref 5–15)
Anion gap: 13 (ref 5–15)
BUN: 20 mg/dL (ref 8–23)
BUN: 13 mg/dL (ref 8–23)
CO2: 22 mmol/L (ref 22–32)
CO2: 22 mmol/L (ref 22–32)
Calcium: 7.4 mg/dL — ABNORMAL LOW (ref 8.9–10.3)
Calcium: 7.5 mg/dL — ABNORMAL LOW (ref 8.9–10.3)
Chloride: 102 mmol/L (ref 98–111)
Chloride: 100 mmol/L (ref 98–111)
Creatinine, Ser: 1.24 mg/dL (ref 0.61–1.24)
Creatinine, Ser: 0.73 mg/dL (ref 0.61–1.24)
GFR calc Af Amer: >60 mL/min (ref >60)
GFR calc Af Amer: >60 mL/min (ref >60)
GFR calc non Af Amer: >60 mL/min (ref >60)
GFR calc non Af Amer: >60 mL/min (ref >60)
Glucose, Bld: 217 mg/dL — ABNORMAL HIGH (ref 70–99)
Glucose, Bld: 169 mg/dL — ABNORMAL HIGH (ref 70–99)
Phosphorus: 2.6 mg/dL (ref 2.5–4.6)
Phosphorus: 1.2 mg/dL — ABNORMAL LOW (ref 2.5–4.6)
Potassium: 4.2 mmol/L (ref 3.5–5.1)
Potassium: 3.8 mmol/L (ref 3.5–5.1)
Sodium: 136 mmol/L (ref 135–145)
Sodium: 135 mmol/L (ref 135–145)

## 2018-12-08 LAB — POCT ACTIVATED CLOTTING TIME
Activated Clotting Time: 120 seconds
Activated Clotting Time: 147 seconds
Activated Clotting Time: 153 seconds
Activated Clotting Time: 158 seconds
Activated Clotting Time: 158 seconds
Activated Clotting Time: 158 seconds
Activated Clotting Time: 169 seconds

## 2018-12-08 LAB — GLUCOSE, CAPILLARY
Glucose-Capillary: 160 mg/dL — ABNORMAL HIGH (ref 70–99)
Glucose-Capillary: 160 mg/dL — ABNORMAL HIGH (ref 70–99)
Glucose-Capillary: 165 mg/dL — ABNORMAL HIGH (ref 70–99)
Glucose-Capillary: 168 mg/dL — ABNORMAL HIGH (ref 70–99)
Glucose-Capillary: 179 mg/dL — ABNORMAL HIGH (ref 70–99)
Glucose-Capillary: 183 mg/dL — ABNORMAL HIGH (ref 70–99)

## 2018-12-08 MED ORDER — AMIODARONE LOAD VIA INFUSION
150.0000 mg | Freq: Once | INTRAVENOUS | Status: AC
  Administered 2018-12-08: 14:00:00 150 mg via INTRAVENOUS
  Filled 2018-12-08: qty 83.34

## 2018-12-08 MED ORDER — DOCUSATE SODIUM 50 MG/5ML PO LIQD: 100.0000 mg | Freq: Two times a day (BID) | ORAL | Status: DC | PRN

## 2018-12-08 MED ORDER — MIDAZOLAM HCL 2 MG/2ML IJ SOLN
INTRAMUSCULAR | Status: AC
  Filled 2018-12-08: qty 2

## 2018-12-08 MED ORDER — DOCUSATE SODIUM 50 MG/5ML PO LIQD
50.0000 mg | Freq: Every day | ORAL | Status: DC
  Administered 2018-12-08: 50 mg
  Filled 2018-12-08: qty 10

## 2018-12-08 MED ORDER — VASOPRESSIN 20 UNIT/ML IV SOLN
0.0400 [IU]/min | INTRAVENOUS | Status: DC
  Administered 2018-12-08: 0.03 [IU]/min via INTRAVENOUS
  Administered 2018-12-09 – 2018-12-11 (×4): 0.04 [IU]/min via INTRAVENOUS
  Filled 2018-12-08 (×6): qty 2

## 2018-12-08 MED ORDER — FENTANYL 2500MCG IN NS 250ML (10MCG/ML) PREMIX INFUSION
50.0000 ug/h | INTRAVENOUS | Status: DC
  Administered 2018-12-09: 50 ug/h via INTRAVENOUS
  Administered 2018-12-09: 20:00:00 175 ug/h via INTRAVENOUS
  Administered 2018-12-10 – 2018-12-11 (×4): 200 ug/h via INTRAVENOUS
  Filled 2018-12-08 (×7): qty 250

## 2018-12-08 MED ORDER — FENTANYL CITRATE (PF) 100 MCG/2ML IJ SOLN
100.0000 ug | Freq: Once | INTRAMUSCULAR | Status: AC
  Administered 2018-12-08: 13:00:00 100 ug via INTRAVENOUS

## 2018-12-08 MED ORDER — BISACODYL 10 MG RE SUPP: 10.0000 mg | Freq: Every day | RECTAL | Status: DC | PRN

## 2018-12-08 MED ORDER — INSULIN GLARGINE 100 UNIT/ML ~~LOC~~ SOLN
23.0000 [IU] | Freq: Every day | SUBCUTANEOUS | Status: DC
  Administered 2018-12-08 – 2018-12-10 (×3): 23 [IU] via SUBCUTANEOUS
  Filled 2018-12-08 (×4): qty 0.23

## 2018-12-08 MED ORDER — CHLORHEXIDINE GLUCONATE 0.12% ORAL RINSE (MEDLINE KIT)
15.0000 mL | Freq: Two times a day (BID) | OROMUCOSAL | Status: DC
  Administered 2018-12-08 – 2018-12-12 (×7): 15 mL via OROMUCOSAL

## 2018-12-08 MED ORDER — MIDODRINE HCL 5 MG PO TABS
15.0000 mg | ORAL_TABLET | Freq: Three times a day (TID) | ORAL | Status: DC
  Administered 2018-12-08 – 2018-12-11 (×9): 15 mg via ORAL
  Filled 2018-12-08 (×9): qty 3

## 2018-12-08 MED ORDER — SODIUM CHLORIDE 0.9 % IV SOLN
250.0000 [IU]/h | INTRAVENOUS | Status: DC
  Administered 2018-12-08: 500 [IU]/h via INTRAVENOUS_CENTRAL
  Administered 2018-12-09: 01:00:00 1400 [IU]/h via INTRAVENOUS_CENTRAL
  Administered 2018-12-09: 21:00:00 1850 [IU]/h via INTRAVENOUS_CENTRAL
  Administered 2018-12-09: 1600 [IU]/h via INTRAVENOUS_CENTRAL
  Administered 2018-12-09: 13:00:00 1650 [IU]/h via INTRAVENOUS_CENTRAL
  Administered 2018-12-10 – 2018-12-11 (×6): 1950 [IU]/h via INTRAVENOUS_CENTRAL
  Filled 2018-12-08 (×14): qty 2

## 2018-12-08 MED ORDER — FENTANYL BOLUS VIA INFUSION
50.0000 ug | INTRAVENOUS | Status: DC | PRN
  Administered 2018-12-11 – 2018-12-12 (×3): 50 ug via INTRAVENOUS
  Filled 2018-12-08: qty 50

## 2018-12-08 MED ORDER — ORAL CARE MOUTH RINSE
15.0000 mL | OROMUCOSAL | Status: DC
  Administered 2018-12-08 – 2018-12-12 (×38): 15 mL via OROMUCOSAL

## 2018-12-08 MED ORDER — FENTANYL CITRATE (PF) 100 MCG/2ML IJ SOLN
50.0000 ug | INTRAMUSCULAR | Status: DC | PRN
  Administered 2018-12-08 – 2018-12-10 (×2): 50 ug via INTRAVENOUS
  Filled 2018-12-08: qty 2

## 2018-12-08 MED ORDER — FENTANYL CITRATE (PF) 100 MCG/2ML IJ SOLN
25.0000 ug | INTRAMUSCULAR | Status: DC | PRN
  Administered 2018-12-08 (×2): 100 ug via INTRAVENOUS
  Administered 2018-12-08 – 2018-12-11 (×2): 50 ug via INTRAVENOUS
  Filled 2018-12-08 (×3): qty 2

## 2018-12-08 MED ORDER — AMIODARONE HCL IN DEXTROSE 360-4.14 MG/200ML-% IV SOLN
30.0000 mg/h | INTRAVENOUS | Status: DC
  Administered 2018-12-09 – 2018-12-11 (×5): 30 mg/h via INTRAVENOUS
  Filled 2018-12-08 (×6): qty 200

## 2018-12-08 MED ORDER — AMIODARONE HCL IN DEXTROSE 360-4.14 MG/200ML-% IV SOLN
60.0000 mg/h | INTRAVENOUS | Status: AC
  Administered 2018-12-08 (×2): 60 mg/h via INTRAVENOUS
  Filled 2018-12-08: qty 200

## 2018-12-08 MED ORDER — MIDAZOLAM HCL 2 MG/2ML IJ SOLN
2.0000 mg | INTRAMUSCULAR | Status: DC | PRN
  Administered 2018-12-10: 2 mg via INTRAVENOUS
  Filled 2018-12-08 (×3): qty 2

## 2018-12-08 MED ORDER — DOCUSATE SODIUM 50 MG/5ML PO LIQD: 50.0000 mg | Freq: Every day | ORAL | Status: DC

## 2018-12-08 MED ORDER — FENTANYL CITRATE (PF) 100 MCG/2ML IJ SOLN: 50.0000 ug | Freq: Once | INTRAMUSCULAR | Status: DC

## 2018-12-08 MED ORDER — ONDANSETRON HCL 4 MG/2ML IJ SOLN
4.0000 mg | Freq: Four times a day (QID) | INTRAMUSCULAR | Status: DC | PRN
  Administered 2018-12-12: 4 mg via INTRAVENOUS
  Filled 2018-12-08: qty 2

## 2018-12-08 MED ORDER — JEVITY 1.2 CAL PO LIQD
1000.0000 mL | ORAL | Status: DC
  Filled 2018-12-08 (×2): qty 1000

## 2018-12-08 MED ORDER — OXYCODONE HCL 5 MG/5ML PO SOLN
7.5000 mg | ORAL | Status: DC | PRN
  Administered 2018-12-08: 7.5 mg via ORAL
  Filled 2018-12-08: qty 10

## 2018-12-08 MED ORDER — NOREPINEPHRINE 4 MG/250ML-% IV SOLN
INTRAVENOUS | Status: AC
  Filled 2018-12-08: qty 250

## 2018-12-08 MED ORDER — NOREPINEPHRINE 16 MG/250ML-% IV SOLN
0.0000 ug/min | INTRAVENOUS | Status: DC
  Administered 2018-12-08: 2 ug/min via INTRAVENOUS
  Administered 2018-12-09: 38 ug/min via INTRAVENOUS
  Administered 2018-12-09 (×2): 40 ug/min via INTRAVENOUS
  Administered 2018-12-10: 15:00:00 26 ug/min via INTRAVENOUS
  Administered 2018-12-10: 04:00:00 38 ug/min via INTRAVENOUS
  Administered 2018-12-11: 24 ug/min via INTRAVENOUS
  Administered 2018-12-11: 25 ug/min via INTRAVENOUS
  Administered 2018-12-12: 20 ug/min via INTRAVENOUS
  Filled 2018-12-08 (×9): qty 250

## 2018-12-08 MED ORDER — LEVOTHYROXINE SODIUM 100 MCG/5ML IV SOLN
62.5000 ug | Freq: Every day | INTRAVENOUS | Status: DC
  Administered 2018-12-08 – 2018-12-11 (×4): 62.5 ug via INTRAVENOUS
  Filled 2018-12-08 (×4): qty 5

## 2018-12-08 MED ORDER — HEPARIN BOLUS VIA INFUSION (CRRT)
1000.0000 [IU] | INTRAVENOUS | Status: DC | PRN
  Filled 2018-12-08: qty 1000

## 2018-12-08 MED ORDER — ROCURONIUM BROMIDE 50 MG/5ML IV SOLN
60.0000 mg | Freq: Once | INTRAVENOUS | Status: AC
  Administered 2018-12-08: 60 mg via INTRAVENOUS
  Filled 2018-12-08: qty 6

## 2018-12-08 MED ORDER — PANTOPRAZOLE SODIUM 40 MG IV SOLR
40.0000 mg | INTRAVENOUS | Status: DC
  Administered 2018-12-08: 40 mg via INTRAVENOUS
  Filled 2018-12-08: qty 40

## 2018-12-08 MED ORDER — SODIUM PHOSPHATES 45 MMOLE/15ML IV SOLN
30.0000 mmol | Freq: Once | INTRAVENOUS | Status: AC
  Administered 2018-12-08: 30 mmol via INTRAVENOUS
  Filled 2018-12-08: qty 10

## 2018-12-08 MED ORDER — MIDAZOLAM HCL 2 MG/2ML IJ SOLN
2.0000 mg | Freq: Once | INTRAMUSCULAR | Status: AC
  Administered 2018-12-08: 2 mg via INTRAVENOUS

## 2018-12-08 MED ORDER — ETOMIDATE 2 MG/ML IV SOLN
20.0000 mg | Freq: Once | INTRAVENOUS | Status: AC
  Administered 2018-12-08: 13:00:00 20 mg via INTRAVENOUS

## 2018-12-08 NOTE — Progress Notes (Signed)
Called regarding quick clotting of CRRT- have added heparin to system  Louis Meckel

## 2018-12-08 NOTE — Progress Notes (Signed)
NAME:  [name], MRN:  [id number], DOB:  11-Oct-1955, LOS: 89 ADMISSION DATE:  10/29/2018, CONSULTATION DATE:  10/8 REFERRING MD:  Dr, Tawanna Solo, Amrit , CHIEF COMPLAINT:  Acute on chronic respiratory failure   Brief History   Patient is a 47-year male with history of ESRD on HD, peripheral vascular disease s/p left AKA, R femur fx, diabetes, morbid obesity, HTN, HLD who presented with infected sacral ulcer.  Patient is largely bedbound and sometimes ambulates with a wheelchair. He reported developing a small ulcer about 4 weeks ago which has now progressed to stage IV sacral decubitus ulcer. There was no report of fever or chills at home.  Patient was admitted for the management of decubitus ulcer.  History of present illness   10/8 patient had increasing hypoxia and lethargic requiring intubation. Unable to tolerate scheduled HD 2/2 hypotension. PCCM was consulted for management.   Past Medical History  Type 2 diabetes mellitus with diabetic nephropathy, with long-term current use of insulin (HCC)   OBESITY   Essential hypertension   End stage renal disease (HCC)   Chronic venous insufficiency   Unstageable pressure ulcer of right buttock (HCC)   Closed fracture of distal end of right femur (HCC)   S/P AKA (above knee amputation) unilateral, left ([place])   Decubitus ulcer of sacral region, stage 4 ([place])   DNR (do not resuscitate) discussion   Chronic respiratory failure with hypoxia ([place])   Adult failure to thrive  Significant Hospital Events   10/8 intubated  Consults:  PCCM Ortho surgery General surgery Palliative care nephrology  Procedures:  Dialysis Echo  Debridement   Significant Diagnostic Tests:  10/8- CTA chest 9/30-CT  Micro Data:  10/8 Blood Cx>> 12/06/2018 right pleural fluid>>  Antimicrobials:  9/27 vancomycin>> 9/27 cefepime>>  Interim history/subjective:  Awake alert no acute distress on 10 waiting protocol.  Objective   Blood pressure (!)  81/59, pulse (!) 106, temperature (!) 97.4 F (36.3 C), temperature source Oral, resp. rate 20, height 5\' 5"  (1.651 m), weight 121.3 kg, SpO2 98 %.    Vent Mode: PRVC FiO2 (%):  [40 %] 40 % Set Rate:  [16 bmp] 16 bmp Vt Set:  [490 mL] 490 mL PEEP:  [5 cmH20] 5 cmH20 Plateau Pressure:  [25 cmH20-28 cmH20] 25 cmH20   Intake/Output Summary (Last 24 hours) at 12/08/2018 0752 Last data filed at 12/08/2018 0700 Gross per 24 hour  Intake 2281.32 ml  Output 3875 ml  Net -1593.68 ml   Filed Weights   12/06/18 0421 12/07/18 0359 12/08/18 0258  Weight: 123 kg 124.5 kg 121.3 kg    Examination: General: Frail elderly male chronically ill-appearing HEENT: Right HD catheter in the internal jugular vein.  Endotracheal tube is in place.  Feeding tube is in place Neuro: Grossly intact follows commands moves all extremities x3 CV: Heart sounds are distant PULM: Coarse rhonchi GI: Abdomen is tense slightly tender right lower quadrant Extremities: Left BKA, right lower extremity with vascular ischemia, left upper extremity edematous with new breakdown Skin: Warm        Resolved Hospital Problem list     Assessment & Plan:  Mr. [name] is a 63yo male presenting to PCCM for management of intubation and vent settings.   Acute on chronic hypoxic respiratory failure: patient remains on vent at 40% FiO2 and 98% O2 sats. Baseline is 4L [place]. H/o OSA, pulm HTN, HFrEF, cardiorenal syndrome. CT 9/30 revealed new consolidation of LLL with air bronchograms with  moderate right and small left pleural effusions and atelectasis. CTA chest 10/8 revealed no PE. still significant pleural effusion R>L  Daily weaning Status post thoracentesis 12/06/2018 Monitor culture data Pleural effusion  Sepsis/septic shock:  Source likely sacral ulcers and/or abdominal abscess as seen on CT 9/30 and/or urostomy. Blood cultures 10/8 pending. Deescalate treatment as able. Patient currently requiring levophed at 69mcg to  maintain systolic [id number]. Patient was hypotensive overnight and received 1L IV hydration as well as albumin. patiet has chronic hypotension on midodrine. Patient remains tachycardic this am. Fluid resuscitation cautiously with patient's fragile fluid status.  Currently on antimicrobial therapy low threshold to discontinue Continue midodrine Wean pressors as able I suspect this wound will never heal.  Stage IV sacral decubitus ulcer: Underwent bedside debridement on 11/17/2018 by general surgery.   Being followed by general surgery Continue antimicrobial therapy currently is at 2 weeks.  If no positive culture data will be of antimicrobial holiday Pain management he became delirious with Dilaudid.  We will restart his home oxycodone continue low-dose fentanyl intravenously. Other alternative pain management to be investigated.   ESRD on hemodialysis: Dialyzed on TTS.  Nephrology following and is on dialysis.  Dialysis being difficult due to hypotension.  Currently on CRRT Per nephrology Repleted phosphorus 04/09/2018  Diabetes type 2 CBG (last 3)  Recent Labs    12/07/18 2348 12/08/18 0100 12/08/18 0347  GLUCAP 168* 160* 179*   Plan scale insulin protocol Titrating Lantus to 23 units nightly  Hypothyroidism: chronic. Continue Synthroid  Chronic normocytic anemia: Recent Labs    12/06/18 1255 12/07/18 0352  HGB 8.1* 8.2*    Transfuse per protocol  Constipation 12/08/2018 placed on bowel regimen has not had a BM since 12/04/2018  Goals of care: Palliative care was following.  Palliative care discussed with family. Wife wanted to continue full scope of treatment. Remains full code  Best practice:  Diet: OG 40/hr Pain/Anxiety/Delirium protocol (if indicated): fentanyl VAP protocol (if indicated): in place DVT prophylaxis: hep [place] GI prophylaxis: PPI, zofran Glucose control:  lantus + sSSI Mobility: bed rest Code Status: full Family Communication: 11/28/2018 patient  updated at bedside Disposition: ICU  Labs   CBC: Recent Labs  Lab 12/04/18 0332 12/05/18 0705 12/05/18 1156 12/06/18 0252 12/06/18 1255 12/07/18 0352  WBC 17.8* 23.2*  --  20.6* 19.3* 18.6*  NEUTROABS  --  21.1*  --   --   --   --   HGB 8.5* 8.8* 10.2* 7.7* 8.1* 8.2*  HCT 30.7* 32.3* 30.0* 28.0* 28.3* 29.6*  MCV 90.0 92.0  --  90.6 88.7 90.8  PLT 126* 135*  --  148* 141* 133*    Basic Metabolic Panel: Recent Labs  Lab 12/02/18 0441  12/05/18 1118 12/05/18 1156 12/06/18 0252 12/06/18 1634 12/07/18 0352 12/07/18 1808  NA  --    < > 134* 136 135 135 135 136  K  --    < > 4.7 3.9 3.7 4.0 4.0 4.0  CL  --    < > 99  --  100 101 100 102  CO2  --    < > 27  --  24 20* 24 24  GLUCOSE  --    < > 113*  --  139* 191* 176* 149*  BUN  --    < > 21  --  29* 32* 26* 19  CREATININE  --    < > 2.00*  --  2.43* 2.44* 1.76* 1.22  CALCIUM  --    < >  8.3*  --  8.1* 8.0* 7.8* 7.7*  MG 1.8  --  1.9  --   --   --  2.0  --   PHOS  --    < > 2.9  --  1.3* 1.8* 1.4* 1.2*   < > = values in this interval not displayed.   GFR: Estimated Creatinine Clearance: 76.8 mL/min (by C-G formula based on SCr of 1.22 mg/dL). Recent Labs  Lab 12/05/18 0705 12/05/18 1118 12/06/18 0252 12/06/18 1255 12/07/18 0352  PROCALCITON  --  5.17 5.53  --  4.06  WBC 23.2*  --  20.6* 19.3* 18.6*    Liver Function Tests: Recent Labs  Lab 12/05/18 1118 12/06/18 0252 12/06/18 1634 12/07/18 0352 12/07/18 1808  ALBUMIN 2.6* 2.3* 2.5* 2.4* 2.5*   ABG    Component Value Date/Time   PHART 7.322 (L) 12/05/2018 1156   PCO2ART 57.4 (H) 12/05/2018 1156   PO2ART 107.0 12/05/2018 1156   HCO3 30.2 (H) 12/05/2018 1156   TCO2 32 12/05/2018 1156   ACIDBASEDEF 4.0 (H) 08/14/2012 1431   O2SAT 98.0 12/05/2018 1156    HbA1C: Hemoglobin A1C  Date/Time Value Ref Range Status  04/29/2015 5.3  Final   Hgb A1c MFr Bld  Date/Time Value Ref Range Status  03/09/2017 06:50 AM 5.1 4.8 - 5.6 % Final    Comment:     (NOTE) Pre diabetes:          5.7%-6.4% Diabetes:              >6.4% Glycemic control for   <7.0% adults with diabetes   03/20/2016 11:31 AM 5.3 4.6 - 6.5 % Final    Comment:    Glycemic Control Guidelines for People with Diabetes:Non Diabetic:  <6%Goal of Therapy: <7%Additional Action Suggested:  >8%     CBG: Recent Labs  Lab 12/07/18 1522 12/07/18 1954 12/07/18 2348 12/08/18 0100 12/08/18 0347  GLUCAP 144* 139* 168* 160* 179*      App Critical care time: 34 min     Steve Husain Costabile ACNP Maryanna Shape PCCM Pager [phone] till 1 pm If no answer page [phone] 12/08/2018, 7:52 AM

## 2018-12-08 NOTE — Progress Notes (Signed)
eLink Physician-Brief Progress Note Patient Name: [name] DOB: 1955-08-15 MRN: [id number]   Date of Service  12/08/2018  HPI/Events of Note  Notified of limited IV access. IV team will attempt placing a line in Left UE. Has a triple lumen HD cath that is currently being used for CRRT and all infusions  eICU Interventions  Rn notes central line not urgently needed Will notify day team that a central line may be needed if unable to place left arm IV         [name] [name] 12/08/2018, 5:16 AM

## 2018-12-08 NOTE — Progress Notes (Signed)
Developed A fib with RVR.  Will start amiodarone.  Continue levophed and add vasopressin for hypotension.  Chesley Mires, MD [facility] Pulmonary/Critical Care 12/08/2018, 2:01 PM

## 2018-12-08 NOTE — Progress Notes (Signed)
Events of today (extubation, BiPAP, re-intubation, Afib RVR, requiring pressors) noted.  Discussed events of today with ICU RN Saralyn Pilar and Lovey Newcomer patient's wife.  Also discussed CRRT status, wound status and concern that even if he makes it thru this hospitalization we are concerned he will not be able to have outpatient hemodialysis.  Pine indicated understanding.  I explained that we are doing everything possible for him right now but it may not be enough to keep him alive.  She understood. Presented her with Hard Choices booklet and asked her about the tracheostomy decision.  She commented that if he was suffering and unable to interact with her as he is today then she would not want to move forward with trach.  At RN Patrick's request we re-entered to room to speak with Lovey Newcomer again and discussed code status.  Saralyn Pilar spoke gently but frankly with Lovey Newcomer and explained why [name] may have cardiac arrest today.  We discussed our concern over what his mental status may be after such an event.  After this discussion I asked Lovey Newcomer if [name] arrests would she want [place] to attempt resuscitation and she replied, "no".     Lovey Newcomer does want [place] to continue all current care including vent support, pressors and CRRT, but she would not want [place] to resuscitate him if he arrested.  Florentina Jenny, PA-C Palliative Medicine Pager: [phone]  Time 25 min.  Greater than 50%  of this time was spent counseling and coordinating care related to the above assessment and plan

## 2018-12-08 NOTE — Procedures (Signed)
Extubation Procedure Note  Patient Details:   Name: [name] DOB: [birth year] MRN: [id number]   Airway Documentation:    Vent end date: 12/08/18 Vent end time: 1006   Evaluation  O2 sats: stable throughout Complications: No apparent complications Patient did tolerate procedure well. Bilateral Breath Sounds: Rhonchi   Yes  4l/min [place] placed, sp02 97% Incentive spirometer instructed and 537ml achieved.  Revonda Standard 12/08/2018, 10:06 AM

## 2018-12-08 NOTE — Progress Notes (Addendum)
[facility] Progress Note   Subjective: on CRRT, net neg, pulling 150 mL/ hr.  Pressors increased.  RLE dusky and L arm with + weeping.  Still intubated.    Objective Vitals:   12/08/18 0645 12/08/18 0700 12/08/18 0800 12/08/18 0846  BP: (!) 79/61 (!) 81/59 (!) 89/58   Pulse: (!) 103 (!) 106 (!) 115   Resp: 18 20 (!) 21   Temp:   98 F (36.7 C)   TempSrc:   Oral   SpO2: 100% 98% 98% 100%  Weight:      Height:       Physical Exam General: Obese, chronically ill appearing male, intubated, alert Heart: S1,S2 RRR Lungs: mechanical breath sounds bilaterally Abdomen: Active BS, obese, NT, subcutaneous bruising  Extremities: L AKA with 2+ stump edema. RLE 1+ edema, cool leg, mottling, dusky in appearance. Dialysis Access: R AVF + bruit, aneurysmal SKIN: large stage IV sacral decub, , L arm with + redness and weeping and several ulcerations of skin.      Additional Objective Labs: Basic Metabolic Panel: Recent Labs  Lab 12/06/18 1634 12/07/18 0352 12/07/18 1808  NA 135 135 136  K 4.0 4.0 4.0  CL 101 100 102  CO2 20* 24 24  GLUCOSE 191* 176* 149*  BUN 32* 26* 19  CREATININE 2.44* 1.76* 1.22  CALCIUM 8.0* 7.8* 7.7*  PHOS 1.8* 1.4* 1.2*   Liver Function Tests: Recent Labs  Lab 12/06/18 1634 12/07/18 0352 12/07/18 1808  ALBUMIN 2.5* 2.4* 2.5*   No results for input(s): LIPASE, AMYLASE in the last 168 hours. CBC: Recent Labs  Lab 12/04/18 0332 12/05/18 0705  12/06/18 0252 12/06/18 1255 12/07/18 0352  WBC 17.8* 23.2*  --  20.6* 19.3* 18.6*  NEUTROABS  --  21.1*  --   --   --   --   HGB 8.5* 8.8*   < > 7.7* 8.1* 8.2*  HCT 30.7* 32.3*   < > 28.0* 28.3* 29.6*  MCV 90.0 92.0  --  90.6 88.7 90.8  PLT 126* 135*  --  148* 141* 133*   < > = values in this interval not displayed.   Blood Culture    Component Value Date/Time   SDES FLUID PLEURAL 12/06/2018 1619   SDES FLUID PLEURAL 12/06/2018 1619   SPECREQUEST NONE 12/06/2018 1619   SPECREQUEST  NONE 12/06/2018 1619   CULT  12/06/2018 1619    NO GROWTH < 24 HOURS Performed at [facility], [place] [address], [place], [place] [address]    REPTSTATUS 12/06/2018 FINAL 12/06/2018 1619   REPTSTATUS PENDING 12/06/2018 1619    Cardiac Enzymes: No results for input(s): CKTOTAL, CKMB, CKMBINDEX, TROPONINI in the last 168 hours. CBG: Recent Labs  Lab 12/07/18 1954 12/07/18 2348 12/08/18 0100 12/08/18 0347 12/08/18 0811  GLUCAP 139* 168* 160* 179* 183*   Iron Studies: No results for input(s): IRON, TIBC, TRANSFERRIN, FERRITIN in the last 72 hours. '@[email or web address]' @ Studies/Results: Dg Chest 1 View  Result Date: 12/07/2018 CLINICAL DATA:  Check ET tube/ OG tube EXAM: CHEST  1 VIEW COMPARISON:  Chest radiograph 12/06/2018, 12/05/2018 FINDINGS: Stable enlarged cardiomediastinal contours. Endotracheal tube tip projects to 20 in the thoracic inlet and carina. Right central venous catheter tip projects over the distal SVC. Nasogastric courses below the diaphragm out of field of view. There are scattered bilateral linear opacities favored to represent atelectasis. No new focal infiltrate. No pneumothorax or large pleural effusion. IMPRESSION: 1.  Stable support apparatus. 2. Scattered bilateral  atelectasis. No significant pleural effusion. Electronically Signed   By: Audie Pinto M.D.   On: 12/07/2018 19:54   Dg Chest 1 View  Result Date: 12/06/2018 CLINICAL DATA:  63 year old male central line placement. EXAM: CHEST  1 VIEW COMPARISON:  12/05/2018 and earlier. FINDINGS: Portable AP semi upright view at 1150 hours. The patient is mildly rotated to the right. Right IJ approach catheter tip projects at the level of the carina, SVC. ET tube in good position just below the clavicles. Enteric tube courses to the left upper quadrant and is likely stable from yesterday. Confluent right greater than left lung base opacity has probably not significantly changed from the CTA yesterday. Stable cardiac  size and mediastinal contours. IMPRESSION: 1. Right IJ approach venous catheter tip at the level of the SVC. No pneumothorax. 2. Otherwise stable chest from the CT yesterday with right greater than left lung base opacification. Electronically Signed   By: Genevie Ann M.D.   On: 12/06/2018 12:19   Dg Chest Port 1 View  Result Date: 12/08/2018 CLINICAL DATA:  Respiratory failure. EXAM: PORTABLE CHEST 1 VIEW COMPARISON:  December 07, 2018. FINDINGS: Stable cardiomegaly. Endotracheal and nasogastric tubes are unchanged in position. Right internal jugular catheter is unchanged. No pneumothorax or significant pleural effusion is noted. Minimal bibasilar subsegmental atelectasis is noted. Bony thorax is unremarkable. IMPRESSION: Stable support apparatus. Minimal bibasilar subsegmental atelectasis. Electronically Signed   By: Marijo Conception M.D.   On: 12/08/2018 08:16   Dg Chest Port 1 View  Result Date: 12/06/2018 CLINICAL DATA:  Status post right thoracentesis today. EXAM: PORTABLE CHEST 1 VIEW COMPARISON:  Single-view of the chest earlier today. FINDINGS: Support tubes and lines are unchanged. Right pleural effusion is markedly decreased after thoracentesis. Negative for pneumothorax. Subsegmental atelectasis in the bases noted. There is cardiomegaly. IMPRESSION: Marked decrease in right pleural effusion after thoracentesis. Negative for pneumothorax. Bibasilar subsegmental atelectasis. Electronically Signed   By: Inge Rise M.D.   On: 12/06/2018 17:28   US Thoracentesis Asp Pleural Space W/img Guide  Result Date: 12/06/2018 INDICATION: Respiratory failure. Right-sided pleural effusion. Request for diagnostic and therapeutic thoracentesis. EXAM: ULTRASOUND GUIDED RIGHT THORACENTESIS MEDICATIONS: None. COMPLICATIONS: None immediate. PROCEDURE: An ultrasound guided thoracentesis was thoroughly discussed with the patient and questions answered. The benefits, risks, alternatives and complications were also  discussed. The patient understands and wishes to proceed with the procedure. Written consent was obtained. Ultrasound was performed to localize and mark an adequate pocket of fluid in the right chest. The area was then prepped and draped in the normal sterile fashion. 1% Lidocaine was used for local anesthesia. Under ultrasound guidance a 6 Fr Safe-T-Centesis catheter was introduced. Thoracentesis was performed. The catheter was removed and a dressing applied. FINDINGS: A total of approximately 580 mL of clear yellow fluid was removed. Samples were sent to the laboratory as requested by the clinical team. IMPRESSION: Successful ultrasound guided right thoracentesis yielding 580 mL of pleural fluid. Read by: Ascencion Dike PA-C Electronically Signed   By: Corrie Mckusick D.O.   On: 12/06/2018 16:31   Medications: .  prismasol BGK 4/2.5 500 mL/hr at 12/08/18 0636  .  prismasol BGK 4/2.5 200 mL/hr at 12/07/18 1457  . ceFEPime (MAXIPIME) IV Stopped (12/08/18 9030)  . fentaNYL infusion INTRAVENOUS 50 mcg/hr (12/08/18 0900)  . norepinephrine (LEVOPHED) Adult infusion 3.4667 mcg/min (12/08/18 0900)  . prismasol BGK 4/2.5 2,000 mL/hr at 12/08/18 0859  . sodium phosphate  Dextrose 5% IVPB 43 mL/hr at 12/08/18  0900  . vancomycin Stopped (12/07/18 1409)   . chlorhexidine gluconate (MEDLINE KIT)  15 mL Mouth Rinse BID  . Chlorhexidine Gluconate Cloth  6 each Topical Q0600  . collagenase   Topical Daily  . darbepoetin (ARANESP) injection - DIALYSIS  200 mcg Intravenous Q Sat-HD  . docusate  50 mg Per Tube Daily  . feeding supplement (PRO-STAT SUGAR FREE 64)  60 mL Per Tube TID  . feeding supplement (VITAL 1.5 CAL)  1,000 mL Per Tube Q24H  . heparin  5,000 Units Subcutaneous Q8H  . insulin aspart  0-9 Units Subcutaneous Q4H  . insulin glargine  23 Units Subcutaneous QHS  . levothyroxine  125 mcg Per NG tube Q0600  . mouth rinse  15 mL Mouth Rinse 10 times per day  . midodrine  15 mg Per NG tube TID WC  .  pantoprazole sodium  40 mg Per Tube Daily     Dialysis Orders: [place] [facility] [address] [facility]  3h 80mn 113kg (250 lbs) 2/2.5 bath  R AVF -No Heparin  -Mircera 200 mcg IV q 2 weeks last dose 8/27  Assessment/Plan:  1. Acute hypoxic RF: intubated, in ICU.  S/p thora with transudate.  Per PCCM.  Weaning on vent as tolerated- trying for more UF to aid in extubation 2. Stage IV sacral decubitus ulcer - Limited mobility. Mostly wheelchair/bedbound. S/P bedside debridement per surgery 9/28. Hydrotherapy, Vanc, cefepime - per primary.[facility] [place]  Refused dressing changes/ hydrotherapy, now back to getting it. 3. Abdominal Pain: CT shows small residual collection posterion schedule. r border of lived concerning for abscess. On Vanc/Cefepime.  4. ESRD -HD TTS.  Had HD 10/06.  Low temp, got albumin.  Na 128 which notes vol overload, extra rx 10/7--> ultimately failed effective vol removal on HD d/t hypotension although on midodrine. Now on CRRT for volume removal, started 10/8- 5. Hypotension/volume: vol overloaded and hypotensive, on pressors and midodrine. 6. Anemia - Hgb 8.5 Rec'd Aranesp 100 mcg IV Follow HGB--> Increase to 200 q Saturday for 10/10  7. Metabolic bone disease -Phos low, stop renvela, getting Na phos today 8. Nutrition -Albumin 1.9. Prot supp for low albumin  9. DM -insulin per primary  10. S/p cystectomy with urostomy 11. RLE duskiness/ coolness- had been seen by Dr. DSharol Given  Ortho c/s requested per primary. 12. Dispo:  Appreciate PCCM and palliative.  Full scope care.  Hopeful for [facility]/c to [facility] 12/08/2018, 9:34 AM  [facility]

## 2018-12-08 NOTE — Progress Notes (Signed)
SLP Cancellation Note  Patient Details Name: [name] MRN: [id number] DOB: 03/20/1955   Cancelled treatment:       Reason Eval/Treat Not Completed: Patient not medically ready  Patient was extubated this AM after a 3 day intubation.  Unfortunately he is currently on continuous BiPAP and confused per nursing.  ST will follow up to assess for readiness for clinical swallowing evaluation.   Shelly Flatten, MA, CCC-SLP Acute Rehab SLP [phone]  [name] 12/08/2018, 12:32 PM

## 2018-12-08 NOTE — Progress Notes (Signed)
Wasted 75 mL of dilaudid in narcotic waste bin with Loletha Grayer, RN, as a witness. Patient was not tolerating medication.

## 2018-12-08 NOTE — Progress Notes (Signed)
Extubated earlier today.  Few hours after, developed hypoxia and altered mental status.  Tried on Bipap w/o improvement.  Pt wife updated at bedside.  She agreed to proceed with reintubation, and she would be agreeable to tracheostomy if needed.  Chesley Mires, MD [facility] Pulmonary/Critical Care 12/08/2018, 1:30 PM

## 2018-12-08 NOTE — Progress Notes (Signed)
IV team reconsulted this am for CVL access for lab draw per Dr. Jeannene Patella.

## 2018-12-08 NOTE — Progress Notes (Signed)
  Amiodarone Drug - Drug Interaction Consult Note  Recommendations: There are no major DD interactions currently on the patients medication list. Ondansetron is a prn medication that can prolong the qt so if the patient requires a lot of ondansetron will have to monitor closer. For now continue to monitor for DD interactions   Amiodarone is metabolized by the cytochrome P450 system and therefore has the potential to cause many drug interactions. Amiodarone has an average plasma half-life of 50 days (range 20 to 100 days).   There is potential for drug interactions to occur several weeks or months after stopping treatment and the onset of drug interactions may be slow after initiating amiodarone.   []  Statins: Increased risk of myopathy. Simvastatin- restrict dose to 20mg  daily. Other statins: counsel patients to report any muscle pain or weakness immediately.  []  Anticoagulants: Amiodarone can increase anticoagulant effect. Consider warfarin dose reduction. Patients should be monitored closely and the dose of anticoagulant altered accordingly, remembering that amiodarone levels take several weeks to stabilize.  []  Antiepileptics: Amiodarone can increase plasma concentration of phenytoin, the dose should be reduced. Note that small changes in phenytoin dose can result in large changes in levels. Monitor patient and counsel on signs of toxicity.  []  Beta blockers: increased risk of bradycardia, AV block and myocardial depression. Sotalol - avoid concomitant use.  []   Calcium channel blockers (diltiazem and verapamil): increased risk of bradycardia, AV block and myocardial depression.  []   Cyclosporine: Amiodarone increases levels of cyclosporine. Reduced dose of cyclosporine is recommended.  []  Digoxin dose should be halved when amiodarone is started.  []  Diuretics: increased risk of cardiotoxicity if hypokalemia occurs.  []  Oral hypoglycemic agents (glyburide, glipizide, glimepiride):  increased risk of hypoglycemia. Patient's glucose levels should be monitored closely when initiating amiodarone therapy.   []  Drugs that prolong the QT interval:  Torsades de pointes risk may be increased with concurrent use - avoid if possible.  Monitor QTc, also keep magnesium/potassium WNL if concurrent therapy can't be avoided. Marland Kitchen Antibiotics: e.g. fluoroquinolones, erythromycin. . Antiarrhythmics: e.g. quinidine, procainamide, disopyramide, sotalol. . Antipsychotics: e.g. phenothiazines, haloperidol.  . Lithium, tricyclic antidepressants, and methadone. Thank You,  Phillis Haggis  12/08/2018 1:58 PM

## 2018-12-08 NOTE — Procedures (Signed)
Intubation Procedure Note [name] [id number] [birth year]  Procedure: Intubation Indications: Respiratory insufficiency  Procedure Details Consent: Risks of procedure as well as the alternatives and risks of each were explained to the (patient/caregiver).  Consent for procedure obtained. Time Out: Verified patient identification, verified procedure, site/side was marked, verified correct patient position, special equipment/implants available, medications/allergies/relevent history reviewed, required imaging and test results available.  Performed  MAC and 3 Medications:  Fentanyl 100 mcg iv Etomidate 20 mg iv Versed 2 mg iv NMB rocuronium 6 mg iv    Evaluation Hemodynamic Status: BP stable throughout; O2 sats: stable throughout Patient's Current Condition: stable Complications: No apparent complications Patient did tolerate procedure well. Chest X-ray ordered to verify placement.  CXR: pending.   [name] Minor ACNP Maryanna Shape PCCM Pager [phone] till 3 pm If no answer page [phone] 12/08/2018, 1:05 PM

## 2018-12-08 NOTE — Progress Notes (Signed)
RT note-Patient intubated for respiratory insufficiency after being on NIV. Placed on full support at previous settings.

## 2018-12-09 ENCOUNTER — Inpatient Hospital Stay (HOSPITAL_COMMUNITY): Payer: Medicare Other

## 2018-12-09 DIAGNOSIS — N186 End stage renal disease: Secondary | ICD-10-CM

## 2018-12-09 DIAGNOSIS — L89154 Pressure ulcer of sacral region, stage 4: Principal | ICD-10-CM

## 2018-12-09 LAB — POCT ACTIVATED CLOTTING TIME
Activated Clotting Time: 125 seconds
Activated Clotting Time: 169 seconds
Activated Clotting Time: 169 seconds
Activated Clotting Time: 169 seconds
Activated Clotting Time: 180 seconds
Activated Clotting Time: 186 seconds
Activated Clotting Time: 186 seconds
Activated Clotting Time: 186 seconds
Activated Clotting Time: 186 seconds
Activated Clotting Time: 186 seconds
Activated Clotting Time: 186 seconds
Activated Clotting Time: 191 seconds
Activated Clotting Time: 191 seconds
Activated Clotting Time: 191 seconds
Activated Clotting Time: 197 seconds
Activated Clotting Time: 197 seconds
Activated Clotting Time: 197 seconds
Activated Clotting Time: 197 seconds

## 2018-12-09 LAB — RENAL FUNCTION PANEL
Albumin: 2.2 g/dL — ABNORMAL LOW (ref 3.5–5.0)
Albumin: 2.2 g/dL — ABNORMAL LOW (ref 3.5–5.0)
Anion gap: 11 (ref 5–15)
Anion gap: 13 (ref 5–15)
BUN: 17 mg/dL (ref 8–23)
BUN: 16 mg/dL (ref 8–23)
CO2: 21 mmol/L — ABNORMAL LOW (ref 22–32)
CO2: 21 mmol/L — ABNORMAL LOW (ref 22–32)
Calcium: 7 mg/dL — ABNORMAL LOW (ref 8.9–10.3)
Calcium: 7.2 mg/dL — ABNORMAL LOW (ref 8.9–10.3)
Chloride: 104 mmol/L (ref 98–111)
Chloride: 102 mmol/L (ref 98–111)
Creatinine, Ser: 1.06 mg/dL (ref 0.61–1.24)
Creatinine, Ser: 1.02 mg/dL (ref 0.61–1.24)
GFR calc Af Amer: >60 mL/min (ref >60)
GFR calc Af Amer: >60 mL/min (ref >60)
GFR calc non Af Amer: >60 mL/min (ref >60)
GFR calc non Af Amer: >60 mL/min (ref >60)
Glucose, Bld: 194 mg/dL — ABNORMAL HIGH (ref 70–99)
Glucose, Bld: 185 mg/dL — ABNORMAL HIGH (ref 70–99)
Phosphorus: 1.2 mg/dL — ABNORMAL LOW (ref 2.5–4.6)
Phosphorus: 2 mg/dL — ABNORMAL LOW (ref 2.5–4.6)
Potassium: 4.4 mmol/L (ref 3.5–5.1)
Potassium: 4.2 mmol/L (ref 3.5–5.1)
Sodium: 136 mmol/L (ref 135–145)
Sodium: 136 mmol/L (ref 135–145)

## 2018-12-09 LAB — CBC WITH DIFFERENTIAL/PLATELET
Abs Immature Granulocytes: 0.5 10*3/uL — ABNORMAL HIGH (ref 0.00–0.07)
Basophils Absolute: 0.1 10*3/uL (ref 0.0–0.1)
Basophils Relative: 0 %
Eosinophils Absolute: 0.1 10*3/uL (ref 0.0–0.5)
Eosinophils Relative: 0 %
HCT: 30.6 % — ABNORMAL LOW (ref 39.0–52.0)
Hemoglobin: 8.3 g/dL — ABNORMAL LOW (ref 13.0–17.0)
Immature Granulocytes: 1 %
Lymphocytes Relative: 3 %
Lymphs Abs: 1.1 10*3/uL (ref 0.7–4.0)
MCH: 24.9 pg — ABNORMAL LOW (ref 26.0–34.0)
MCHC: 27.1 g/dL — ABNORMAL LOW (ref 30.0–36.0)
MCV: 91.9 fL (ref 80.0–100.0)
Monocytes Absolute: 1.6 10*3/uL — ABNORMAL HIGH (ref 0.1–1.0)
Monocytes Relative: 4 %
Neutro Abs: 33.2 10*3/uL — ABNORMAL HIGH (ref 1.7–7.7)
Neutrophils Relative %: 92 %
Platelets: 100 10*3/uL — ABNORMAL LOW (ref 150–400)
RBC: 3.33 MIL/uL — ABNORMAL LOW (ref 4.22–5.81)
RDW: 19.7 % — ABNORMAL HIGH (ref 11.5–15.5)
WBC: 36.5 10*3/uL — ABNORMAL HIGH (ref 4.0–10.5)
nRBC: 0.7 % — ABNORMAL HIGH (ref 0.0–0.2)

## 2018-12-09 LAB — APTT: aPTT: 117 seconds — ABNORMAL HIGH (ref 24–36)

## 2018-12-09 LAB — GLUCOSE, CAPILLARY
Glucose-Capillary: 158 mg/dL — ABNORMAL HIGH (ref 70–99)
Glucose-Capillary: 149 mg/dL — ABNORMAL HIGH (ref 70–99)
Glucose-Capillary: 166 mg/dL — ABNORMAL HIGH (ref 70–99)
Glucose-Capillary: 170 mg/dL — ABNORMAL HIGH (ref 70–99)
Glucose-Capillary: 173 mg/dL — ABNORMAL HIGH (ref 70–99)

## 2018-12-09 LAB — MAGNESIUM: Magnesium: 2.1 mg/dL (ref 1.7–2.4)

## 2018-12-09 LAB — CYTOLOGY - NON PAP

## 2018-12-09 MED ORDER — VITAL 1.5 CAL PO LIQD
1000.0000 mL | ORAL | Status: DC
  Administered 2018-12-09: 1000 mL
  Administered 2018-12-10: 16:00:00
  Filled 2018-12-09 (×3): qty 1000

## 2018-12-09 MED ORDER — CALCIUM GLUCONATE-NACL 1-0.675 GM/50ML-% IV SOLN
1.0000 g | Freq: Once | INTRAVENOUS | Status: AC
  Administered 2018-12-09: 08:00:00 1000 mg via INTRAVENOUS
  Filled 2018-12-09: qty 50

## 2018-12-09 MED ORDER — PRO-STAT SUGAR FREE PO LIQD
60.0000 mL | Freq: Three times a day (TID) | ORAL | Status: DC
  Administered 2018-12-09 – 2018-12-11 (×7): 60 mL
  Filled 2018-12-09 (×7): qty 60

## 2018-12-09 MED ORDER — SENNOSIDES 8.8 MG/5ML PO SYRP
10.0000 mL | ORAL_SOLUTION | Freq: Two times a day (BID) | ORAL | Status: DC
  Administered 2018-12-09 – 2018-12-11 (×5): 10 mL
  Filled 2018-12-09 (×5): qty 10

## 2018-12-09 MED ORDER — SODIUM PHOSPHATES 45 MMOLE/15ML IV SOLN
30.0000 mmol | Freq: Once | INTRAVENOUS | Status: AC
  Administered 2018-12-09: 30 mmol via INTRAVENOUS
  Filled 2018-12-09: qty 10

## 2018-12-09 MED ORDER — PANTOPRAZOLE SODIUM 40 MG PO PACK
40.0000 mg | PACK | Freq: Every day | ORAL | Status: DC
  Administered 2018-12-09 – 2018-12-11 (×3): 40 mg
  Filled 2018-12-09 (×3): qty 20

## 2018-12-09 MED ORDER — DOCUSATE SODIUM 50 MG/5ML PO LIQD
50.0000 mg | Freq: Every day | ORAL | Status: DC
  Administered 2018-12-09 – 2018-12-11 (×3): 50 mg
  Filled 2018-12-09 (×3): qty 10

## 2018-12-09 MED FILL — Fentanyl Citrate Preservative Free (PF) Inj 1000 MCG/20ML: INTRAMUSCULAR | Qty: 20 | Status: AC

## 2018-12-09 MED FILL — Sodium Chloride IV Soln 0.9%: INTRAVENOUS | Qty: 25 | Status: AC

## 2018-12-09 NOTE — Progress Notes (Addendum)
NAME:  FALCON CUCINELLA, MRN:  [id number], DOB:  May 11, 1955, LOS: 89 ADMISSION DATE:  11/05/2018, CONSULTATION DATE:  10/8 REFERRING MD:  Dr, Tawanna Solo, Amrit , CHIEF COMPLAINT:  Acute on chronic respiratory failure   Brief History   Patient is a 63-year male with history of ESRD on HD, peripheral vascular disease s/p left AKA, R femur fx, diabetes, morbid obesity, HTN, HLD who presented with infected sacral ulcer.  Patient is largely bedbound and sometimes ambulates with a wheelchair. He reported developing a small ulcer about 4 weeks ago which has now progressed to stage IV sacral decubitus ulcer. There was no report of fever or chills at home.  Patient was admitted for the management of decubitus ulcer.  Intubated, extubated and reintubated.   History of present illness   10/8 patient had increasing hypoxia and lethargic requiring intubation. Unable to tolerate scheduled HD 2/2 hypotension. PCCM was consulted for management.   Past Medical History  Type 2 diabetes mellitus with diabetic nephropathy, with long-term current use of insulin (HCC)   OBESITY   Essential hypertension   End stage renal disease (HCC)   Chronic venous insufficiency   Unstageable pressure ulcer of right buttock (HCC)   Closed fracture of distal end of right femur (HCC)   S/P AKA (above knee amputation) unilateral, left ([place])   Decubitus ulcer of sacral region, stage 4 ([place])   DNR (do not resuscitate) discussion   Chronic respiratory failure with hypoxia ([place])   Adult failure to thrive  Significant Hospital Events   10/8 intubated, extubated 10/11 10/11 Reintubated  Consults:  PCCM Ortho surgery General surgery Palliative care nephrology  Procedures:  Dialysis Echo  Debridement   Significant Diagnostic Tests:  10/8- CTA chest 9/30-CT  Micro Data:  10/8 Blood Cx>>NGTD 12/06/2018 right pleural fluid>>NGTD 10/11 tracheal aspirate>>GNR. GPC, GPR, yeast   Antimicrobials:  9/27 vancomycin>> 9/27  cefepime>>  Interim history/subjective:  Extubated and reintubated yesterday. AF with RVR, on amio and heparin gtt.  On 2 pressors. Remains on CRRT.  Wife made pt DNR.  WBCs up to 36.5  Objective   Blood pressure (!) 94/50, pulse (!) 119, temperature 98.1 F (36.7 C), temperature source Oral, resp. rate 20, height 5\' 5"  (1.651 m), weight 122.1 kg, SpO2 100 %.    Vent Mode: PRVC FiO2 (%):  [40 %-60 %] 40 % Set Rate:  [15 bmp-16 bmp] 16 bmp Vt Set:  [490 mL] 490 mL PEEP:  [5 cmH20-6 cmH20] 5 cmH20 Pressure Support:  [10 cmH20] 10 cmH20 Plateau Pressure:  [12 cmH20-22 cmH20] 12 cmH20   Intake/Output Summary (Last 24 hours) at 12/09/2018 0736 Last data filed at 12/09/2018 0700 Gross per 24 hour  Intake 3737.52 ml  Output 4226 ml  Net -488.48 ml   Filed Weights   12/07/18 0359 12/08/18 0258 12/09/18 0352  Weight: 124.5 kg 121.3 kg 122.1 kg    Examination: General: Alert, able to communicate with nods, attempting to talk with ETT in place, frail elderly male chronically ill-appearing. Appears older than chronological age  87: Normocephalic. PERRL. Poor dentition. Right HD catheter in the internal jugular vein.  Endotracheal tube is in place.  Feeding tube is in place Neuro: A&O. Follows commands. Communicates with nods, attempts to speak. Grossly intact follows commands moves all extremities x3 CV: IRRR. S1S2. Heart sounds are distant PULM: FNL, symmetrical. Coarse rhonchi. Vent supported GI: hypoactive BS x4. SNT/ND. Urostomy pouch in place.  Extremities: Left BKA, right lower extremity with vascular ischemia,  B/L upper extremity edema.  Skin: Thin, fragile skin. Warm. LUE breakdown noted with dressing in place.  Did not visualize sacral wound.   Resolved Hospital Problem list     Assessment & Plan:  Mr. Minion is a 63yo male presenting to PCCM for management of intubation and vent settings.   Acute on chronic hypoxic respiratory failure: s/p extubation and  reintubation. Remains on vent at 40% FiO2 and 98% O2 sats. Baseline is 4L [place]. H/o OSA, pulm HTN, HFrEF, cardiorenal syndrome. CT 9/30 revealed new consolidation of LLL with air bronchograms with moderate right and small left pleural effusions and atelectasis. CTA chest 10/8 revealed no PE. Still significant pleural effusion R>L Plan: Continue ventilator support to prevent eminent deterioration and further organ dysfunction from hypoxemia and hypercarbia.   Patient is at risk for sudden hypoxia, barotrauma and hemodynamic compromise.   Maintain SpO2 greater than or equal to 90%. Head of bed elevated 30 degrees. Plateau pressures less than 30 cm H20.  Follow chest x-ray, ABG prn.   SAT/SBT as tolerated. Bronchial hygiene. RT/bronchodilator protocol.   Sepsis/septic shock:  Source likely sacral ulcers and/or abdominal abscess as seen on CT 9/30 and/or urostomy. Blood cultures 10/8 NGTD. Has been on antimicrobial therapy since admission, now with rising WBC, afebrile, no diarrhea. Sputum mixed GN/GP organisms. Requiring 2 pressors. Also had recent hepatic abscess s/p drain.  CT ABD/Pelvis 9/30-Interval removal of the previously demonstrated pigtail pleural drain with a small residual collection along the posterior border of the right lobe liver measuring up to 1.8 x 3.8 x 2.5 cm in size, with a small amount of peripheral rim enhancement. Findings are concerning for residual abscess or infection. Plan:  Increase vasopressin to 0.04units/hr.  Wean levophed for MAP goal 60 and/or SBP >/=85 Continue midodrine Follow WBC, fever curve. F/U sputum speciation/sens. Will need repeat imaging of ABD for hepatic abscess as possible source. Will need to coordinate timing with CRRT.  See below.     Stage IV sacral decubitus ulcer: Underwent bedside debridement on 11/17/2018 by general surgery.  Plan  Followed by general surgery Dakins, hydrotherapy.  No other surgical interventions planned. Wound  will likely never heal.  Antimicrobial therapy currently is at 2 weeks.  Favor abx holiday starting 10/13 if WBC downtrend.  Pain management-he became delirious with Dilaudid.   Continue home oxycodone Continue low-dose fentanyl intravenously.    ESRD on hemodialysis: Normal TTS HD schedule. F/b/ Nephrology following and is on dialysis.  Dialysis being difficult due to hypotension.  Currently on CRRT Per nephrology  Diabetes type 2 CBG (last 3)  Recent Labs    12/08/18 1933 12/08/18 2342 12/09/18 0349  GLUCAP 160* 165* 158*   Sliding scale insulin protocol Lantus 23 units nightly  Hypothyroidism: chronic. Continue Synthroid  Chronic normocytic anemia: Recent Labs    12/08/18 1405 12/09/18 0344  HGB 10.2* 8.3*    Transfuse per protocol  Constipation 12/08/2018 placed on bowel regimen has not had a BM since 12/04/2018  Atrial fibrillation-new onset Plan Continue amiodarone He is on heparin gtt for CRRT clotting   Goals of care: Palliative care was following.  Note reviewed. DNR. Wife "commented that if he was suffering and unable to interact with her as he is today then she would not want to move forward with trach."  Best practice:  Diet: TF per recs  Pain/Anxiety/Delirium protocol (if indicated): fentanyl VAP protocol (if indicated): in place DVT prophylaxis: heparin gtt  GI prophylaxis: PPI Glucose control:  lantus +  sSSI Mobility: bed rest Code Status: DNR Family Communication: will update when she arrives today  Disposition: ICU  Labs   CBC: Recent Labs  Lab 12/05/18 0705  12/06/18 0252 12/06/18 1255 12/07/18 0352 12/08/18 1136 12/08/18 1405 12/09/18 0344  WBC 23.2*  --  20.6* 19.3* 18.6* 26.1*  --  36.5*  NEUTROABS 21.1*  --   --   --   --  23.5*  --  33.2*  HGB 8.8*   < > 7.7* 8.1* 8.2* 7.6* 10.2* 8.3*  HCT 32.3*   < > 28.0* 28.3* 29.6* 29.3* 30.0* 30.6*  MCV 92.0  --  90.6 88.7 90.8 95.8  --  91.9  PLT 135*  --  148* 141* 133* 88*  --   100*   < > = values in this interval not displayed.    Basic Metabolic Panel: Recent Labs  Lab 12/05/18 1118  12/07/18 0352 12/07/18 1808 12/08/18 1137 12/08/18 1405 12/08/18 1802 12/09/18 0344  NA 134*   < > 135 136 136 137 135 136  K 4.7   < > 4.0 4.0 4.2 3.7 3.8 4.4  CL 99   < > 100 102 102  --  100 104  CO2 27   < > 24 24 22   --  22 21*  GLUCOSE 113*   < > 176* 149* 217*  --  169* 194*  BUN 21   < > 26* 19 20  --  13 17  CREATININE 2.00*   < > 1.76* 1.22 1.24  --  0.73 1.06  CALCIUM 8.3*   < > 7.8* 7.7* 7.4*  --  7.5* 7.0*  MG 1.9  --  2.0  --   --   --   --  2.1  PHOS 2.9   < > 1.4* 1.2* 2.6  --  1.2* 1.2*   < > = values in this interval not displayed.   GFR: Estimated Creatinine Clearance: 88.7 mL/min (by C-G formula based on SCr of 1.06 mg/dL). Recent Labs  Lab 12/05/18 1118 12/06/18 0252 12/06/18 1255 12/07/18 0352 12/08/18 1136 12/09/18 0344  PROCALCITON 5.17 5.53  --  4.06  --   --   WBC  --  20.6* 19.3* 18.6* 26.1* 36.5*    Liver Function Tests: Recent Labs  Lab 12/07/18 0352 12/07/18 1808 12/08/18 1137 12/08/18 1802 12/09/18 0344  ALBUMIN 2.4* 2.5* 2.2* 2.5* 2.2*   ABG    Component Value Date/Time   PHART 7.266 (L) 12/08/2018 1405   PCO2ART 47.7 12/08/2018 1405   PO2ART 87.0 12/08/2018 1405   HCO3 21.7 12/08/2018 1405   TCO2 23 12/08/2018 1405   ACIDBASEDEF 5.0 (H) 12/08/2018 1405   O2SAT 95.0 12/08/2018 1405   CBG: Recent Labs  Lab 12/08/18 0811 12/08/18 1401 12/08/18 1933 12/08/18 2342 12/09/18 0349  GLUCAP 183* 168* 160* 165* 158*      The patient is critically ill with respiratory failure and shock. He requires ongoing ICU for high complexity decision making, titration of high alert medications, ventilator management, titration of oxygen and interpretation of advanced monitoring.    I personally spent 45 minutes providing critical care services including personally reviewing test results, discussing care with nursing  staff/other physicians and completing orders pertaining to this patient.  Time was exclusive to the patient and does not include time spent teaching or in procedures.  Voice recognition software was used in the production of this record.  Errors in interpretation may have been inadvertently missed during review.  Francine Graven, MSN, AGACNP  Pager [phone] or if no answer [phone] [facility]  Attending Note:  63 year old male with ESRD on HD who presents with septic shock and respiratory failure on CRRT and pressors now.  No events overnight, remains on the vent with coarse BS on exam.  I reviewed CXR myself, ETT is in a good position and infiltrate noted.  Discussed with PCCM-NP.  Continue CRRT with negative 100 ml/hr to assist with weaning.  Will begin PS trials but no extubation given mental status.  Minimize sedation as able.  DNR status for now and as will meet with family regarding deterioration if occurs.  The patient is critically ill with multiple organ systems failure and requires high complexity decision making for assessment and support, frequent evaluation and titration of therapies, application of advanced monitoring technologies and extensive interpretation of multiple databases.   Critical Care Time devoted to patient care services described in this note is  31  Minutes. This time reflects time of care of this signee Dr Jennet Maduro. This critical care time does not reflect procedure time, or teaching time or supervisory time of PA/NP/Med student/Med Resident etc but could involve care discussion time.  Rush Farmer, M.D. [facility] Pulmonary/Critical Care Medicine. Pager: [phone]. After hours pager: [phone].

## 2018-12-09 NOTE — Progress Notes (Signed)
[facility] ROUNDING NOTE   Subjective:   This is a 63 year old gentleman with end-stage renal disease usually Tuesday Thursday Saturday dialysis.  He has a history of peripheral vascular status post left AKA right femoral fracture diabetes, morbid obesity hypertension hyperlipidemia.  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  It appears that he has chronic hypotension and has been on midodrine.  His home dialysis unit is [facility].  He has limited functional status is mostly wheelchair/bedbound.  He has a history of a cystectomy and urostomy in the past.  His hospital course has been complicated by respiratory failure, this was thought to be secondary to volume overload in setting of ESRD and hypertension he underwent a right thoracentesis and required intubation on 12/05/2018.  It appears that he was extubated on 12/08/2018, he failed extubation and is now reintubated..  Due to his declining status he was placed on CRRT.  There have been ongoing discussions with the family in terms of withdrawal of dialytic support.  Blood pressure 87/26 pulse 121 temperature 98.1 O2 sats 100% FiO2 40%  IV amiodarone IV vasopressin IV norepinephrine  Sodium 136 potassium 4.4 chloride 104 CO2 21 BUN 17 creatinine 1.06 glucose 194 calcium 7.0 phosphorus 1.2 magnesium 2.1 albumin 2.2.  WBC 36.5 hemoglobin 8.3 platelets 100  Darbepoetin 200 mcg last dose 12/07/2018, Lantus 23 units daily subcu, levothyroxine 62.5 mcg daily, midodrine 15 mg 3 times daily, Protonix 40 mg daily    Objective:  Vital signs in last 24 hours:  Temp:  [97 F (36.1 C)-98.1 F (36.7 C)] 98.1 F (36.7 C) (10/12 0351) Pulse Rate:  [66-139] 119 (10/12 0700) Resp:  [15-24] 20 (10/12 0700) BP: (40-121)/(16-96) 94/50 (10/12 0700) SpO2:  [94 %-100 %] 100 % (10/12 0700) FiO2 (%):  [40 %-60 %] 40 % (10/12 0600) Weight:  [122.1 kg] 122.1 kg (10/12  0352)  Weight change: 0.8 kg Filed Weights   12/07/18 0359 12/08/18 0258 12/09/18 0352  Weight: 124.5 kg 121.3 kg 122.1 kg    Intake/Output: I/O last 3 completed shifts: In: 4697.6 [I.V.:3271.2; Other:80; NG/GT:873.3; IV Piggyback:473.1] Out: 1856 [Other:6048]   Intake/Output this shift:  No intake/output data recorded.  General: Obese, chronically ill appearing male  Heart: S1,S2 RRR Lungs:   sounds bilaterally Abdomen: Active BS, obese, NT, subcutaneous bruising  Extremities: L AKA with 2+ stump edema. RLE 1+ edema, cool leg, mottling, dusky in appearance. Dialysis Access: R AVF + bruit, aneurysmal SKIN: large stage IV sacral decub, , L arm with + redness and weeping and several ulcerations of skin.     Basic Metabolic Panel: Recent Labs  Lab 12/05/18 1118  12/07/18 0352 12/07/18 1808 12/08/18 1137 12/08/18 1405 12/08/18 1802 12/09/18 0344  NA 134*   < > 135 136 136 137 135 136  K 4.7   < > 4.0 4.0 4.2 3.7 3.8 4.4  CL 99   < > 100 102 102  --  100 104  CO2 27   < > _0 --  22 21*  GLUCOSE 113*   < > 176* 149* 217*  --  169* 194*  BUN 21   < > 26* 19 20  --  13 17  CREATININE 2.00*   < > 1.76* 1.22 1.24  --  0.73 1.06  CALCIUM 8.3*   < > 7.8* 7.7* 7.4*  --  7.5* 7.0*  MG 1.9  --  2.0  --   --   --   --  2.1  PHOS 2.9   < > 1.4* 1.2* 2.6  --  1.2* 1.2*   < > = values in this interval not displayed.    Liver Function Tests: Recent Labs  Lab 12/07/18 0352 12/07/18 1808 12/08/18 1137 12/08/18 1802 12/09/18 0344  ALBUMIN 2.4* 2.5* 2.2* 2.5* 2.2*   No results for input(s): LIPASE, AMYLASE in the last 168 hours. No results for input(s): AMMONIA in the last 168 hours.  CBC: Recent Labs  Lab 12/05/18 0705  12/06/18 0252 12/06/18 1255 12/07/18 0352 12/08/18 1136 12/08/18 1405 12/09/18 0344  WBC 23.2*  --  20.6* 19.3* 18.6* 26.1*  --  36.5*  NEUTROABS 21.1*  --   --   --   --  23.5*  --  33.2*  HGB 8.8*   < > 7.7* 8.1* 8.2* 7.6* 10.2* 8.3*  HCT  32.3*   < > 28.0* 28.3* 29.6* 29.3* 30.0* 30.6*  MCV 92.0  --  90.6 88.7 90.8 95.8  --  91.9  PLT 135*  --  148* 141* 133* 88*  --  100*   < > = values in this interval not displayed.    Cardiac Enzymes: No results for input(s): CKTOTAL, CKMB, CKMBINDEX, TROPONINI in the last 168 hours.  BNP: Invalid input(s): POCBNP  CBG: Recent Labs  Lab 12/08/18 0811 12/08/18 1401 12/08/18 1933 12/08/18 2342 12/09/18 0349  GLUCAP 183* 168* 160* 165* 158*    Microbiology: Results for orders placed or performed during the hospital encounter of 11/11/2018  SARS CORONAVIRUS 2 (TAT 6-24 HRS) Nasopharyngeal Nasopharyngeal Swab     Status: None   Collection Time: 11/10/2018  3:10 PM   Specimen: Nasopharyngeal Swab  Result Value Ref Range Status   SARS Coronavirus 2 NEGATIVE NEGATIVE Final    Comment: (NOTE) SARS-CoV-2 target nucleic acids are NOT DETECTED. The SARS-CoV-2 RNA is generally detectable in upper and lower respiratory specimens during the acute phase of infection. Negative results do not preclude SARS-CoV-2 infection, do not rule out co-infections with other pathogens, and should not be used as the sole basis for treatment or other patient management decisions. Negative results must be combined with clinical observations, patient history, and epidemiological information. The expected result is Negative. Fact Sheet for Patients: SugarRoll.be Fact Sheet for Healthcare Providers: https://www.woods-mathews.com/ This test is not yet approved or cleared by the [place] FDA and  has been authorized for detection and/or diagnosis of SARS-CoV-2 by FDA under an Emergency Use Authorization (EUA). This EUA will remain  in effect (meaning this test can be used) for the duration of the COVID-19 declaration under Section 56 4(b)(1) of the Act, 21 U.S.C. section 360bbb-3(b)(1), unless the authorization is terminated or revoked sooner. Performed at  [facility], [place] [address] [place], [place] [address]   Culture, blood (routine x 2)     Status: None (Preliminary result)   Collection Time: 12/05/18 11:20 AM   Specimen: BLOOD LEFT HAND  Result Value Ref Range Status   Specimen Description BLOOD LEFT HAND  Final   Special Requests   Final    BOTTLES DRAWN AEROBIC ONLY Blood Culture results may not be optimal due to an inadequate volume of blood received in culture bottles   Culture   Final    NO GROWTH 3 DAYS Performed at [facility], [place] [address], [place], [place] [address]    Report Status PENDING  Incomplete  Culture, blood (routine x 2)     Status: None (Preliminary result)   Collection Time: 12/05/18 11:40 AM   Specimen: BLOOD LEFT ARM  Result Value Ref Range Status   Specimen Description BLOOD LEFT ARM  Final   Special Requests   Final    BOTTLES DRAWN AEROBIC ONLY Blood Culture results may not be optimal due to an inadequate volume of blood received in culture bottles   Culture   Final    NO GROWTH 3 DAYS Performed at [facility], [place] [address] [place], [place] [address]    Report Status PENDING  Incomplete  Gram stain     Status: None   Collection Time: 12/06/18  4:19 PM   Specimen: PATH Cytology Pleural fluid  Result Value Ref Range Status   Specimen Description FLUID PLEURAL  Final   Special Requests NONE  Final   Gram Stain   Final    WBC PRESENT,BOTH PMN AND MONONUCLEAR NO ORGANISMS SEEN CYTOSPIN SMEAR Performed at [facility], [address], [place], [place] [address]    Report Status 12/06/2018 FINAL  Final  Culture, body fluid-bottle     Status: None (Preliminary result)   Collection Time: 12/06/18  4:19 PM   Specimen: Fluid  Result Value Ref Range Status   Specimen Description FLUID PLEURAL  Final   Special Requests NONE  Final   Culture   Final    NO GROWTH 2 DAYS Performed at [place] [address] [place], [place] [address]    Report Status PENDING   Incomplete  Culture, respiratory (non-expectorated)     Status: None (Preliminary result)   Collection Time: 12/08/18  8:00 AM   Specimen: Tracheal Aspirate; Respiratory  Result Value Ref Range Status   Specimen Description TRACHEAL ASPIRATE  Final   Special Requests NONE  Final   Gram Stain   Final    ABUNDANT WBC PRESENT, PREDOMINANTLY PMN ABUNDANT GRAM NEGATIVE RODS ABUNDANT GRAM POSITIVE COCCI IN CLUSTERS FEW YEAST FEW GRAM POSITIVE RODS Performed at [facility], [place] [address] [place], [place] [address]    Culture PENDING  Incomplete   Report Status PENDING  Incomplete    Coagulation Studies: No results for input(s): LABPROT, INR in the last 72 hours.  Urinalysis: No results for input(s): COLORURINE, LABSPEC, PHURINE, GLUCOSEU, HGBUR, BILIRUBINUR, KETONESUR, PROTEINUR, UROBILINOGEN, NITRITE, LEUKOCYTESUR in the last 72 hours.  Invalid input(s): APPERANCEUR    Imaging: Dg Chest 1 View  Result Date: 12/07/2018 CLINICAL DATA:  Check ET tube/ OG tube EXAM: CHEST  1 VIEW COMPARISON:  Chest radiograph 12/06/2018, 12/05/2018 FINDINGS: Stable enlarged cardiomediastinal contours. Endotracheal tube tip projects to 20 in the thoracic inlet and carina. Right central venous catheter tip projects over the distal SVC. Nasogastric courses below the diaphragm out of field of view. There are scattered bilateral linear opacities favored to represent atelectasis. No new focal infiltrate. No pneumothorax or large pleural effusion. IMPRESSION: 1.  Stable support apparatus. 2. Scattered bilateral atelectasis. No significant pleural effusion. Electronically Signed   By: Audie Pinto M.D.   On: 12/07/2018 19:54   Dg Chest Port 1 View  Result Date: 12/08/2018 CLINICAL DATA:  Intubation. EXAM: PORTABLE CHEST 1 VIEW COMPARISON:  Same day. FINDINGS: Stable cardiomegaly. Endotracheal and nasogastric tubes are unchanged in position. Right internal jugular catheter is unchanged. No  pneumothorax is noted. Increased bilateral perihilar and basilar opacities are noted concerning for worsening edema or atelectasis. Small right pleural  effusion cannot be excluded. Bony thorax is unremarkable. IMPRESSION: Stable support apparatus. Increased bilateral perihilar and basilar opacities are noted concerning for worsening edema or atelectasis. Electronically Signed   By: Marijo Conception M.D.   On: 12/08/2018 14:11   Dg Chest Port 1 View  Result Date: 12/08/2018 CLINICAL DATA:  Respiratory failure. EXAM: PORTABLE CHEST 1 VIEW COMPARISON:  December 07, 2018. FINDINGS: Stable cardiomegaly. Endotracheal and nasogastric tubes are unchanged in position. Right internal jugular catheter is unchanged. No pneumothorax or significant pleural effusion is noted. Minimal bibasilar subsegmental atelectasis is noted. Bony thorax is unremarkable. IMPRESSION: Stable support apparatus. Minimal bibasilar subsegmental atelectasis. Electronically Signed   By: Marijo Conception M.D.   On: 12/08/2018 08:16     Medications:   .  prismasol BGK 4/2.5 500 mL/hr at 12/09/18 0253  .  prismasol BGK 4/2.5 200 mL/hr at 12/09/18 0331  . amiodarone 30 mg/hr (12/09/18 0700)  . ceFEPime (MAXIPIME) IV Stopped (12/09/18 0102)  . feeding supplement (JEVITY 1.2 CAL) 40 mL/hr at 12/09/18 0700  . fentaNYL infusion INTRAVENOUS 110 mcg/hr (12/09/18 0700)  . heparin 10,000 units/ 20 mL infusion syringe 1,600 Units/hr (12/09/18 0645)  . norepinephrine (LEVOPHED) Adult infusion 40 mcg/min (12/09/18 0700)  . prismasol BGK 4/2.5 2,000 mL/hr at 12/09/18 0545  . vasopressin (PITRESSIN) infusion - *FOR SHOCK* 0.03 Units/min (12/09/18 0700)   . chlorhexidine gluconate (MEDLINE KIT)  15 mL Mouth Rinse BID  . Chlorhexidine Gluconate Cloth  6 each Topical Q0600  . collagenase   Topical Daily  . darbepoetin (ARANESP) injection - DIALYSIS  200 mcg Intravenous Q Sat-HD  . docusate  50 mg Oral Daily  . fentaNYL (SUBLIMAZE) injection  50 mcg  Intravenous Once  . heparin  5,000 Units Subcutaneous Q8H  . insulin aspart  0-9 Units Subcutaneous Q4H  . insulin glargine  23 Units Subcutaneous QHS  . levothyroxine  62.5 mcg Intravenous Daily  . mouth rinse  15 mL Mouth Rinse 10 times per day  . midodrine  15 mg Oral TID WC  . pantoprazole (PROTONIX) IV  40 mg Intravenous Q24H   acetaminophen **OR** acetaminophen, bisacodyl, docusate, fentaNYL, fentaNYL (SUBLIMAZE) injection, fentaNYL (SUBLIMAZE) injection, heparin, heparin, midazolam, ondansetron (ZOFRAN) IV, oxyCODONE, sodium chloride, sodium hypochlorite  Assessment/ Plan:   ESRD hemodialysis Tuesday Thursday Saturday last dialysis was completed 12/03/2018 has now been changed transition to CRRT for volume removal.  He is getting heparin through CRRT due to clotting of filter.  Hypoxic respiratory failure intubated status post thoracentesis with transudate.  Failed extubation 12/08/2018  Stage IV decubitus limited mobility wheelchair/bedbound status post bedside debridement hydrotherapy vancomycin and cefepime per primary.  Anemia Aranesp is being increased to 200 mcg 12/07/2018  Hypophosphatemia continue to replete with sodium phosphate  Nutrition Albumin low protein support  Diabetes insulin per primary  Status post cystectomy and urostomy  Disposition appreciate palliative medicine  Hypotension not really tolerating much in terms of fluid removal despite being on CRRT we will continue to follow     LOS: [place] _0 _1 :25 AM

## 2018-12-09 NOTE — Progress Notes (Signed)
Nutrition Follow-up / Consult  DOCUMENTATION CODES:   Morbid obesity  INTERVENTION:    Change TF to Vital 1.5 at 30 ml/h (720 ml per day)   Add pro-stat 60 ml TID   Provides 1680 kcal, 139 gm protein, 550 ml free water daily  NUTRITION DIAGNOSIS:   Increased nutrient needs related to chronic illness, wound healing(ESRD on HD) as evidenced by estimated needs.  GOAL:   Provide needs based on ASPEN/SCCM guidelines  MONITOR:   Vent status, TF tolerance, Labs, Skin, I & O's  REASON FOR ASSESSMENT:   Consult Enteral/tube feeding initiation and management  ASSESSMENT:   63 yo male admitted with infected sacral ulcer on 9/27; S/P debridement on 9/28. Patient became hypoxic, hypothermic, lethargic, and hypotensive after HD on 10/8, requiring intubation and transfer to the ICU. PMH includes ESRD-HD, PVD s/p L AKA, R femur fx, DM-2, morbid obesity, HLD, HTN.   Extubated 10/11, had an episode of A fib with RVR, required re-intubation. Remains on CRRT for volume removal.  Palliative Care team is following; patient is now a DNR, but continuing current care.   Received MD Consult for TF initiation and management. OG tube now in place. Receiving Jevity 1.2 at 50 ml/h.   Patient is currently intubated on ventilator support, requiring 2 pressors. MV: 10.7 L/min Temp (24hrs), Avg:97.6 F (36.4 C), Min:97 F (36.1 C), Max:98.1 F (36.7 C)   Labs reviewed. Phosphorus 1.2 (L), BUN WDL, Creatinine WDL CBG's: 158-149  Medications reviewed and include levophed, vasopressin, aranesp, colace, novolog, lantus, senokot, synthroid.  I/O +1.6 L since admission. Weight has increased 8.7 kg since admission.  NUTRITION - FOCUSED PHYSICAL EXAM:  unable to complete  Diet Order:   Diet Order            Diet NPO time specified  Diet effective now              EDUCATION NEEDS:   Not appropriate for education at this time  Skin:  Skin Assessment: Skin Integrity Issues: Skin  Integrity Issues:: Other (Comment) Stage IV: sacrum Other: weeping cellulitis L leg  Last BM:  10/12 type 5  Height:   Ht Readings from Last 1 Encounters:  11/30/18 5\' 5"  (1.651 m)   11/16/2018  113.4 kg (admission weight)  Weight:   Wt Readings from Last 1 Encounters:  12/09/18 122.1 kg    Ideal Body Weight:  56.9 kg  BMI:  Adjusted body mass index is 49.2 kg/m.  Estimated Nutritional Needs:   Kcal:  [id number]  Protein:  120-140 gm  Fluid:  1 L + UOP    Molli Barrows, RD, LDN, [place] Pager [phone] After Hours Pager [phone]

## 2018-12-09 NOTE — Progress Notes (Signed)
Followed up with spiritual support for Mr. Hallberg and wife. Nurse stated that Wife was present all night and went home for the day. She said she might return later in the evening. Also, Nurse said that someone came earlier for follow up support. I entered Mr. Vanderheide room and offered ministry of presence. Chaplain available as needed.   Palliative care Chaplain Resident Fidel Levy [phone]

## 2018-12-09 NOTE — TOC Progression Note (Signed)
Transition of Care [facility]) - Progression Note    Patient Details  Name: [name] MRN: [id number] Date of Birth: 04/01/1955  [facility] CM/SW Contact  [name], RN Phone Number: 12/09/2018, 8:29 AM  Clinical Narrative:  Pt transferred to ICU late last week and now on vent , pressors and sedation.  Pt also now on CRRT.  Pt previously recommended LTACH however it wad delined due to lack of ICU days - TOC will continue to follow      Expected Discharge Plan: [place] (LTAC)    Expected Discharge Plan and Services Expected Discharge Plan: Long Term Acute Care (LTAC)   Discharge Planning Services: CM Consult   Living arrangements for the past 2 months: Single Family Home Expected Discharge Date: 12/02/18               DME Arranged: N/A         HH Arranged: NA           Social Determinants of Health (SDOH) Interventions    Readmission Risk Interventions No flowsheet data found.

## 2018-12-09 NOTE — Progress Notes (Signed)
Patient transported to CT & back on vent with no complications.  Ashley Mariner RRT

## 2018-12-09 NOTE — Progress Notes (Signed)
PT Cancellation Note  Patient Details Name: [name] MRN: [id number] DOB: Dec 24, 1955   Cancelled Treatment:    Reason Eval/Treat Not Completed: Medical issues which prohibited therapy. Pt with tenuous medical status.    [place] 12/09/2018, 3:01 PM [address] Pager [phone] Office [phone]

## 2018-12-10 ENCOUNTER — Inpatient Hospital Stay (HOSPITAL_COMMUNITY): Payer: Medicare Other

## 2018-12-10 DIAGNOSIS — R6521 Severe sepsis with septic shock: Secondary | ICD-10-CM

## 2018-12-10 DIAGNOSIS — A419 Sepsis, unspecified organism: Secondary | ICD-10-CM

## 2018-12-10 LAB — RENAL FUNCTION PANEL
Albumin: 2.3 g/dL — ABNORMAL LOW (ref 3.5–5.0)
Albumin: 2.2 g/dL — ABNORMAL LOW (ref 3.5–5.0)
Anion gap: 13 (ref 5–15)
Anion gap: 10 (ref 5–15)
BUN: 18 mg/dL (ref 8–23)
BUN: 19 mg/dL (ref 8–23)
CO2: 24 mmol/L (ref 22–32)
CO2: 23 mmol/L (ref 22–32)
Calcium: 7.5 mg/dL — ABNORMAL LOW (ref 8.9–10.3)
Calcium: 7.2 mg/dL — ABNORMAL LOW (ref 8.9–10.3)
Chloride: 99 mmol/L (ref 98–111)
Chloride: 103 mmol/L (ref 98–111)
Creatinine, Ser: 0.9 mg/dL (ref 0.61–1.24)
Creatinine, Ser: 0.81 mg/dL (ref 0.61–1.24)
GFR calc Af Amer: >60 mL/min (ref >60)
GFR calc Af Amer: >60 mL/min (ref >60)
GFR calc non Af Amer: >60 mL/min (ref >60)
GFR calc non Af Amer: >60 mL/min (ref >60)
Glucose, Bld: 206 mg/dL — ABNORMAL HIGH (ref 70–99)
Glucose, Bld: 152 mg/dL — ABNORMAL HIGH (ref 70–99)
Phosphorus: 1.6 mg/dL — ABNORMAL LOW (ref 2.5–4.6)
Phosphorus: 2.3 mg/dL — ABNORMAL LOW (ref 2.5–4.6)
Potassium: 4.5 mmol/L (ref 3.5–5.1)
Potassium: 4.4 mmol/L (ref 3.5–5.1)
Sodium: 136 mmol/L (ref 135–145)
Sodium: 136 mmol/L (ref 135–145)

## 2018-12-10 LAB — CULTURE, BLOOD (ROUTINE X 2)
Culture: NO GROWTH
Culture: NO GROWTH

## 2018-12-10 LAB — GLUCOSE, CAPILLARY
Glucose-Capillary: 114 mg/dL — ABNORMAL HIGH (ref 70–99)
Glucose-Capillary: 148 mg/dL — ABNORMAL HIGH (ref 70–99)
Glucose-Capillary: 156 mg/dL — ABNORMAL HIGH (ref 70–99)
Glucose-Capillary: 162 mg/dL — ABNORMAL HIGH (ref 70–99)
Glucose-Capillary: 183 mg/dL — ABNORMAL HIGH (ref 70–99)
Glucose-Capillary: 206 mg/dL — ABNORMAL HIGH (ref 70–99)
Glucose-Capillary: 209 mg/dL — ABNORMAL HIGH (ref 70–99)

## 2018-12-10 LAB — CBC
HCT: 31.6 % — ABNORMAL LOW (ref 39.0–52.0)
Hemoglobin: 8.7 g/dL — ABNORMAL LOW (ref 13.0–17.0)
MCH: 25.4 pg — ABNORMAL LOW (ref 26.0–34.0)
MCHC: 27.5 g/dL — ABNORMAL LOW (ref 30.0–36.0)
MCV: 92.4 fL (ref 80.0–100.0)
Platelets: 84 10*3/uL — ABNORMAL LOW (ref 150–400)
RBC: 3.42 MIL/uL — ABNORMAL LOW (ref 4.22–5.81)
RDW: 20.6 % — ABNORMAL HIGH (ref 11.5–15.5)
WBC: 22.7 10*3/uL — ABNORMAL HIGH (ref 4.0–10.5)
nRBC: 0.6 % — ABNORMAL HIGH (ref 0.0–0.2)

## 2018-12-10 LAB — POCT ACTIVATED CLOTTING TIME
Activated Clotting Time: 197 seconds
Activated Clotting Time: 197 seconds
Activated Clotting Time: 197 seconds
Activated Clotting Time: 202 seconds
Activated Clotting Time: 208 seconds
Activated Clotting Time: 208 seconds
Activated Clotting Time: 219 seconds

## 2018-12-10 LAB — APTT: aPTT: 94 seconds — ABNORMAL HIGH (ref 24–36)

## 2018-12-10 LAB — MAGNESIUM: Magnesium: 2.2 mg/dL (ref 1.7–2.4)

## 2018-12-10 MED ORDER — DEXMEDETOMIDINE HCL IN NACL 400 MCG/100ML IV SOLN
0.4000 ug/kg/h | INTRAVENOUS | Status: DC
  Administered 2018-12-10 (×2): 0.4 ug/kg/h via INTRAVENOUS
  Administered 2018-12-10 – 2018-12-11 (×2): 0.5 ug/kg/h via INTRAVENOUS
  Administered 2018-12-11: 23:00:00 1 ug/kg/h via INTRAVENOUS
  Administered 2018-12-11 (×2): 0.6 ug/kg/h via INTRAVENOUS
  Administered 2018-12-11 – 2018-12-12 (×2): 1 ug/kg/h via INTRAVENOUS
  Administered 2018-12-12: 1.1 ug/kg/h via INTRAVENOUS
  Administered 2018-12-12 (×2): 1.2 ug/kg/h via INTRAVENOUS
  Filled 2018-12-10 (×13): qty 100

## 2018-12-10 MED ORDER — INSULIN ASPART 100 UNIT/ML ~~LOC~~ SOLN
4.0000 [IU] | SUBCUTANEOUS | Status: DC
  Administered 2018-12-10 – 2018-12-11 (×7): 4 [IU] via SUBCUTANEOUS

## 2018-12-10 MED ORDER — SODIUM PHOSPHATES 45 MMOLE/15ML IV SOLN
10.0000 mmol | Freq: Once | INTRAVENOUS | Status: DC
  Filled 2018-12-10: qty 3.33

## 2018-12-10 MED ORDER — K PHOS MONO-SOD PHOS DI & MONO 155-852-130 MG PO TABS
500.0000 mg | ORAL_TABLET | Freq: Three times a day (TID) | ORAL | Status: DC
  Administered 2018-12-10: 500 mg via NASOGASTRIC
  Filled 2018-12-10: qty 2

## 2018-12-10 MED ORDER — CALCIUM GLUCONATE-NACL 1-0.675 GM/50ML-% IV SOLN
1.0000 g | Freq: Once | INTRAVENOUS | Status: AC
  Administered 2018-12-10: 08:00:00 1000 mg via INTRAVENOUS
  Filled 2018-12-10: qty 50

## 2018-12-10 MED ORDER — SODIUM PHOSPHATES 45 MMOLE/15ML IV SOLN
30.0000 mmol | Freq: Once | INTRAVENOUS | Status: AC
  Administered 2018-12-10: 09:00:00 30 mmol via INTRAVENOUS
  Filled 2018-12-10: qty 10

## 2018-12-10 NOTE — Progress Notes (Signed)
[facility] ROUNDING NOTE   Subjective:   This is a 63 year old gentleman with end-stage renal disease usually Tuesday Thursday Saturday dialysis.  He has a history of peripheral vascular status post left AKA right femoral fracture diabetes, morbid obesity hypertension hyperlipidemia.  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  It appears that he has chronic hypotension and has been on midodrine.  His home dialysis unit is [place] [facility].  He has limited functional status is mostly wheelchair/bedbound.  He has a history of a cystectomy and urostomy in the past.  His hospital course has been complicated by respiratory failure, this was thought to be secondary to volume overload in setting of ESRD and hypertension he underwent a right thoracentesis and required intubation on 12/05/2018.  It appears that he was extubated on 12/08/2018, he failed extubation and is now reintubated..  Due to his declining status he was placed on CRRT.  There have been ongoing discussions with the family in terms of withdrawal of dialytic support.  Blood pressure 97/36 pulse 99 temperature 98.1 O2 sats 100% 40% FiO2  Sodium 136 potassium 4.5 chloride 99 CO2 24 BUN 18 creatinine 0.9 glucose 206 calcium 7.5 phosphorus 1.6 magnesium 2.2 albumin 2.3 hemoglobin 8.7 WBC 22.7 platelets 84  IV amiodarone IV vasopressin IV norepinephrine  Darbepoetin 200 mcg last dose 12/07/2018, Lantus 23 units daily subcu, levothyroxine 62.5 mcg daily, midodrine 15 mg 3 times daily, Protonix 40 mg daily K-Phos Neutral 500 mg 3 times daily  Repeat CT abdomen 12/09/2018 no substantial interval change.   Objective:  Vital signs in last 24 hours:  Temp:  [98 F (36.7 C)-98.5 F (36.9 C)] 98.1 F (36.7 C) (10/13 0700) Pulse Rate:  [88-124] 103 (10/13 0500) Resp:  [17-27] 21 (10/13 0600) BP: (63-125)/(19-79) 114/19 (10/13 0600) SpO2:  [99 %-100 %] 100 % (10/13  0750) FiO2 (%):  [40 %] 40 % (10/13 0750) Weight:  [118.1 kg] 118.1 kg (10/13 0305)  Weight change: -4 kg Filed Weights   12/08/18 0258 12/09/18 0352 12/10/18 0305  Weight: 121.3 kg 122.1 kg 118.1 kg    Intake/Output: I/O last 3 completed shifts: In: 5174.9 [I.V.:3472.6; NG/GT:1295.8; IV Piggyback:406.5] Out: 7585 [Other:7585]   Intake/Output this shift:  Total I/O In: 183.9 [I.V.:78.9; NG/GT:105] Out: 245 [Other:245]  General: Obese, chronically ill appearing male  Heart: S1,S2 RRR Lungs:   sounds bilaterally Abdomen: Active BS, obese, NT, subcutaneous bruising  Extremities: L AKA with 2+ stump edema. RLE 1+ edema, cool leg, mottling, dusky in appearance. Dialysis Access: R AVF + bruit, aneurysmal SKIN: large stage IV sacral decub, , L arm with + redness and weeping and several ulcerations of skin.     Basic Metabolic Panel: Recent Labs  Lab 12/05/18 1118  12/07/18 0352  12/08/18 1137 12/08/18 1405 12/08/18 1802 12/09/18 0344 12/09/18 1723 12/10/18 0611  NA 134*   < > 135   < > 136 137 135 136 136 136  K 4.7   < > 4.0   < > 4.2 3.7 3.8 4.4 4.2 4.5  CL 99   < > 100   < > 102  --  100 104 102 99  CO2 27   < > 24   < > 22  --  22 21* 21* 24  GLUCOSE 113*   < > 176*   < > 217*  --  169* 194* 185* 206*  BUN 21   < >  26*   < > 20  --  _0 CREATININE 2.00*   < > 1.76*   < > 1.24  --  0.73 1.06 1.02 0.90  CALCIUM 8.3*   < > 7.8*   < > 7.4*  --  7.5* 7.0* 7.2* 7.5*  MG 1.9  --  2.0  --   --   --   --  2.1  --  2.2  PHOS 2.9   < > 1.4*   < > 2.6  --  1.2* 1.2* 2.0* 1.6*   < > = values in this interval not displayed.    Liver Function Tests: Recent Labs  Lab 12/08/18 1137 12/08/18 1802 12/09/18 0344 12/09/18 1723 12/10/18 0611  ALBUMIN 2.2* 2.5* 2.2* 2.2* 2.3*   No results for input(s): LIPASE, AMYLASE in the last 168 hours. No results for input(s): AMMONIA in the last 168 hours.  CBC: Recent Labs  Lab 12/05/18 0705  12/06/18 1255 12/07/18 0352  12/08/18 1136 12/08/18 1405 12/09/18 0344 12/10/18 0611  WBC 23.2*   < > 19.3* 18.6* 26.1*  --  36.5* 22.7*  NEUTROABS 21.1*  --   --   --  23.5*  --  33.2*  --   HGB 8.8*   < > 8.1* 8.2* 7.6* 10.2* 8.3* 8.7*  HCT 32.3*   < > 28.3* 29.6* 29.3* 30.0* 30.6* 31.6*  MCV 92.0   < > 88.7 90.8 95.8  --  91.9 92.4  PLT 135*   < > 141* 133* 88*  --  100* 84*   < > = values in this interval not displayed.    Cardiac Enzymes: No results for input(s): CKTOTAL, CKMB, CKMBINDEX, TROPONINI in the last 168 hours.  BNP: Invalid input(s): POCBNP  CBG: Recent Labs  Lab 12/09/18 1504 12/09/18 1945 12/10/18 0018 12/10/18 0408 12/10/18 0724  GLUCAP 170* 173* 206* 183* 209*    Microbiology: Results for orders placed or performed during the hospital encounter of 11/11/2018  SARS CORONAVIRUS 2 (TAT 6-24 HRS) Nasopharyngeal Nasopharyngeal Swab     Status: None   Collection Time: 11/10/2018  3:10 PM   Specimen: Nasopharyngeal Swab  Result Value Ref Range Status   SARS Coronavirus 2 NEGATIVE NEGATIVE Final    Comment: (NOTE) SARS-CoV-2 target nucleic acids are NOT DETECTED. The SARS-CoV-2 RNA is generally detectable in upper and lower respiratory specimens during the acute phase of infection. Negative results do not preclude SARS-CoV-2 infection, do not rule out co-infections with other pathogens, and should not be used as the sole basis for treatment or other patient management decisions. Negative results must be combined with clinical observations, patient history, and epidemiological information. The expected result is Negative. Fact Sheet for Patients: SugarRoll.be Fact Sheet for Healthcare Providers: https://www.woods-mathews.com/ This test is not yet approved or cleared by the [place] FDA and  has been authorized for detection and/or diagnosis of SARS-CoV-2 by FDA under an Emergency Use Authorization (EUA). This EUA will remain  in effect  (meaning this test can be used) for the duration of the COVID-19 declaration under Section 56 4(b)(1) of the Act, 21 U.S.C. section 360bbb-3(b)(1), unless the authorization is terminated or revoked sooner. Performed at [facility], [place] [address], [place], [place] [address]   Culture, blood (routine x 2)     Status: None (Preliminary result)   Collection Time: 12/05/18 11:20 AM   Specimen: BLOOD LEFT HAND  Result Value Ref Range Status   Specimen Description  BLOOD LEFT HAND  Final   Special Requests   Final    BOTTLES DRAWN AEROBIC ONLY Blood Culture results may not be optimal due to an inadequate volume of blood received in culture bottles   Culture   Final    NO GROWTH 4 DAYS Performed at [facility], [place] [address] [place], [place] [address]    Report Status PENDING  Incomplete  Culture, blood (routine x 2)     Status: None (Preliminary result)   Collection Time: 12/05/18 11:40 AM   Specimen: BLOOD LEFT ARM  Result Value Ref Range Status   Specimen Description BLOOD LEFT ARM  Final   Special Requests   Final    BOTTLES DRAWN AEROBIC ONLY Blood Culture results may not be optimal due to an inadequate volume of blood received in culture bottles   Culture   Final    NO GROWTH 4 DAYS Performed at [facility], [place] [address], [place], [place] [address]    Report Status PENDING  Incomplete  Gram stain     Status: None   Collection Time: 12/06/18  4:19 PM   Specimen: PATH Cytology Pleural fluid  Result Value Ref Range Status   Specimen Description FLUID PLEURAL  Final   Special Requests NONE  Final   Gram Stain   Final    WBC PRESENT,BOTH PMN AND MONONUCLEAR NO ORGANISMS SEEN CYTOSPIN SMEAR Performed at [facility], [address] [place], [place] [address]    Report Status 12/06/2018 FINAL  Final  Culture, body fluid-bottle     Status: None (Preliminary result)   Collection Time: 12/06/18  4:19 PM   Specimen: Fluid  Result Value Ref Range  Status   Specimen Description FLUID PLEURAL  Final   Special Requests NONE  Final   Culture   Final    NO GROWTH 3 DAYS Performed at [place] [address], [place], [place] [address]    Report Status PENDING  Incomplete  Culture, respiratory (non-expectorated)     Status: None (Preliminary result)   Collection Time: 12/08/18  8:00 AM   Specimen: Tracheal Aspirate; Respiratory  Result Value Ref Range Status   Specimen Description TRACHEAL ASPIRATE  Final   Special Requests NONE  Final   Gram Stain   Final    ABUNDANT WBC PRESENT, PREDOMINANTLY PMN ABUNDANT GRAM NEGATIVE RODS ABUNDANT GRAM POSITIVE COCCI IN CLUSTERS FEW YEAST FEW GRAM POSITIVE RODS    Culture   Final    CULTURE REINCUBATED FOR BETTER GROWTH Performed at [facility], [place] [address] [place], [place] [address]    Report Status PENDING  Incomplete    Coagulation Studies: No results for input(s): LABPROT, INR in the last 72 hours.  Urinalysis: No results for input(s): COLORURINE, LABSPEC, PHURINE, GLUCOSEU, HGBUR, BILIRUBINUR, KETONESUR, PROTEINUR, UROBILINOGEN, NITRITE, LEUKOCYTESUR in the last 72 hours.  Invalid input(s): APPERANCEUR    Imaging: Ct Abdomen Pelvis Wo Contrast  Result Date: 12/09/2018 CLINICAL DATA:  Status post hepatic abscess drain. Sepsis. EXAM: CT ABDOMEN AND PELVIS WITHOUT CONTRAST TECHNIQUE: Multidetector CT imaging of the abdomen and pelvis was performed following the standard protocol without IV contrast. COMPARISON:  11/27/2018 FINDINGS: Lower chest: Bibasilar collapse/consolidation noted, right greater than left with some improvement in lingular aeration since prior. Bilateral pleural effusions, right greater than left, again noted. Hepatobiliary: The small fluid collection identified previously adjacent to the posterior right liver is stable to minimally smaller, measuring 1.9 x  3.3 cm today on axial imaging which compares to 1.8 x 3.8 cm previously. Gallbladder  surgically absent. No intrahepatic or extrahepatic biliary dilation. Pancreas: No focal mass lesion. No dilatation of the main duct. No intraparenchymal cyst. No peripancreatic edema. Spleen: No splenomegaly. No focal mass lesion. Adrenals/Urinary Tract: No adrenal nodule or mass. Right renal parenchyma markedly atrophic with multiple renal cysts and/or dilated calices. Lower pole right renal stone is similar to prior. Similar appearance of marked left renal atrophy with cystic change. Ureters remain prominent with periureteric ill-defined soft tissue potentially related to edema or inflammation. Patient is status post cystectomy with right lower quadrant ileal diversion. Stomach/Bowel: NG tube tip is in the gastric antrum. Stomach largely decompressed. Duodenum is normally positioned as is the ligament of Treitz. Distal duodenal diverticulum evident. No small bowel wall thickening. No small bowel dilatation. The terminal ileum is normal. Appendix similar to prior without substantial edema or inflammation. Left colonic diverticulosis noted. The sigmoid colon is elongated and redundant and extends out into a right groin hernia. No colonic dilatation proximal to this hernia to suggest obstruction. There is some fluid in the hernia sac. Much of the inferior pelvis is obscured by beam hardening artifact from bilateral hip replacement. Vascular/Lymphatic: There is abdominal aortic atherosclerosis without aneurysm. Mild hepato duodenal ligament lymphadenopathy is stable. Mild para-aortic abdominal lymphadenopathy is stable. Index aortocaval node on 38/9 measures 1.3 cm short axis. Left para-aortic node measures 1.2 cm short axis on 36/9. There is mild common iliac lymphadenopathy and pelvic sidewall lymphadenopathy bilaterally, similar to prior. Although partially obscured pine a right common iliac node measures 2 cm short axis on 83/9, similar to prior. Lymphadenopathy in the groin regions bilaterally is similar to  prior. Reproductive: Obscured by beam hardening artifact. Other: No intraperitoneal free fluid. Musculoskeletal: Left groin hernia contains only fat. Diffuse body wall edema noted. Bilateral hip replacement. Bones are diffusely demineralized. Midline wound noted lower anterior abdominal wall. IMPRESSION: 1. No substantial interval change in exam. The small fluid collection adjacent to the posterior right liver is stable to minimally smaller in the interval. 2. Stable bilateral pleural effusions with bibasilar collapse/consolidation, right greater than left. 3. 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 4. Persistent periureteric soft tissue potentially related to edema or inflammation without overt hydroureteronephrosis. Patient is status post cystectomy with ileal diversion. 5. Right groin hernia contains sigmoid colon with some fluid in the hernia sac. Imaging features are similar to prior and there is no colonic dilatation proximal to the hernia to suggest obstruction. 6. Left groin hernia contains only fat. 7. Abdominal aortic atherosclerosis. 8. Diffuse body wall edema. Aortic Atherosclerosis (ICD10-I70.0). Electronically Signed   By: Misty Stanley M.D.   On: 12/09/2018 16:56   Dg Chest Port 1 View  Result Date: 12/10/2018 CLINICAL DATA:  Pleural effusion EXAM: PORTABLE CHEST 1 VIEW COMPARISON:  Abdominal CT from yesterday FINDINGS: Endotracheal tube tip at the clavicular heads. Right IJ line with tip at the upper SVC. The orogastric tube reaches the distal stomach. Cardiomegaly with hazy opacity at the right more than left lung base. This is pneumonia and atelectasis with trace pleural effusions by recent CT. IMPRESSION: 1. Stable hardware positioning. 2. Stable bilateral pulmonary opacification with atelectasis, pneumonia, and trace effusions by recent CT. Electronically Signed   By: Monte Fantasia M.D.   On: 12/10/2018 07:48   Dg Chest Port 1 View  Result Date:  12/08/2018 CLINICAL DATA:  Intubation. EXAM: PORTABLE CHEST 1 VIEW COMPARISON:  Same day. FINDINGS: Stable cardiomegaly. Endotracheal and nasogastric tubes are unchanged in position. Right internal jugular catheter is unchanged. No pneumothorax is noted. Increased bilateral perihilar and basilar opacities are noted concerning for worsening edema or atelectasis. Small right pleural effusion cannot be excluded. Bony thorax is unremarkable. IMPRESSION: Stable support apparatus. Increased bilateral perihilar and basilar opacities are noted concerning for worsening edema or atelectasis. Electronically Signed   By: Marijo Conception M.D.   On: 12/08/2018 14:11     Medications:   .  prismasol BGK 4/2.5 500 mL/hr at 12/10/18 0026  .  prismasol BGK 4/2.5 200 mL/hr at 12/10/18 0625  . amiodarone 30 mg/hr (12/10/18 0800)  . calcium gluconate 1,000 mg (12/10/18 0810)  . dexmedetomidine (PRECEDEX) IV infusion 0.4 mcg/kg/hr (12/10/18 0800)  . fentaNYL infusion INTRAVENOUS 200 mcg/hr (12/10/18 0800)  . heparin 10,000 units/ 20 mL infusion syringe 1,950 Units/hr (12/10/18 0619)  . norepinephrine (LEVOPHED) Adult infusion 28 mcg/min (12/10/18 0800)  . prismasol BGK 4/2.5 2,000 mL/hr at 12/10/18 0505  . sodium phosphate  Dextrose 5% IVPB    . vasopressin (PITRESSIN) infusion - *FOR SHOCK* 0.04 Units/min (12/10/18 0800)   . chlorhexidine gluconate (MEDLINE KIT)  15 mL Mouth Rinse BID  . Chlorhexidine Gluconate Cloth  6 each Topical Q0600  . collagenase   Topical Daily  . darbepoetin (ARANESP) injection - DIALYSIS  200 mcg Intravenous Q Sat-HD  . docusate  50 mg Per Tube Daily  . feeding supplement (PRO-STAT SUGAR FREE 64)  60 mL Per Tube TID  . feeding supplement (VITAL 1.5 CAL)  1,000 mL Per Tube Q24H  . fentaNYL (SUBLIMAZE) injection  50 mcg Intravenous Once  . insulin aspart  0-9 Units Subcutaneous Q4H  . insulin aspart  4 Units Subcutaneous Q4H  . insulin glargine  23 Units Subcutaneous QHS  .  levothyroxine  62.5 mcg Intravenous Daily  . mouth rinse  15 mL Mouth Rinse 10 times per day  . midodrine  15 mg Oral TID WC  . pantoprazole sodium  40 mg Per Tube Daily  . phosphorus  500 mg Per NG tube TID  . sennosides  10 mL Per Tube BID   acetaminophen **OR** acetaminophen, bisacodyl, fentaNYL, fentaNYL (SUBLIMAZE) injection, fentaNYL (SUBLIMAZE) injection, heparin, heparin, midazolam, ondansetron (ZOFRAN) IV, oxyCODONE, sodium chloride, sodium hypochlorite  Assessment/ Plan:   ESRD hemodialysis Tuesday Thursday Saturday last dialysis was completed 12/03/2018 has now been changed transition to CRRT 12/06/2018 for volume removal.  He is getting heparin through CRRT due to clotting of filter.  Hypoxic respiratory failure intubated status post thoracentesis with transudate.  Failed extubation 12/08/2018  Stage IV decubitus limited mobility wheelchair/bedbound status post bedside debridement hydrotherapy vancomycin and cefepime per primary.  Anemia Aranesp is being increased to 200 mcg 12/07/2018  Shock with hemodynamic instability requiring pressors vasopressin and Levophed  Hypophosphatemia continue to replete with sodium phosphate per primary team now on oral phosphorus.  Nutrition Albumin low protein support  Diabetes insulin per primary  Status post cystectomy and urostomy   Disposition appreciate palliative medicine  Hypotension not really tolerating much in terms of fluid removal despite being on CRRT we will continue to follow     LOS: [place] _0 _1 :11 AM

## 2018-12-10 NOTE — Progress Notes (Signed)
Patient ID: [name], male   DOB: Sep 04, 1955, 63 y.o.   MRN: [id number]  This NP spoke with patient's wife by telephone as f/u for palliative medicine needs and emotional support   17-year male with history of end-stage renal disease on dialysis, peripheral vascular disease status post left AKA, right femur fracture, diabetes, morbid obesity, hypertension, hyperlipidemia, infected sacral ulcer.    He is now intubated, and continues to decliene  Patient is overall failing to thrive, his long term prognosis is poor.  Wife continues to face treatment option decisions, advanced directive decisions and anticipatory care needs.  Created space and opportunity for wife to explore her thoughts and feelings regarding current medical situation.  Values and goals of care important to patient and family were attempted to be elicited.  Wife verbalizes that this is "not how her husband would want to live."  I raised awareness to the concepts of human morality and the limitations of medical interventions to prolong quality of life.  Again today she verbalizes understanding of the seriousness of the medical situation , "I hear what everyone is saying" but is unable to de-escalate interventions.  "I need more time"  Emotional support offered.  Discussed again  with wife the importance of continued conversation with the  medical providers regarding overall plan of care and treatment options,  ensuring decisions are within the context of the patients values and GOCs and his best interest.  Questions and concerns addressed     Total time spent on the unit was 25 minutes  Greater than 50% of the time was spent in counseling and coordination of care  Wadie Lessen NP  Palliative Medicine Team Team Phone # [phone] Pager [phone]

## 2018-12-10 NOTE — Progress Notes (Signed)
PT Cancellation Note  Patient Details Name: [name] MRN: [id number] DOB: June 03, 1955   Cancelled Treatment:    Reason Eval/Treat Not Completed: Other (comment). On going discussions with wife about plan for possible one way extubation. From wound standpoint it is extremely unlikely that the wound would ever heal and doubt pt would tolerate HD sitting in chair which would be required for pt to ever leave a hospital or LTACH setting. Will continue to monitor pt status and decisions.    Shary Decamp [facility] 12/10/2018, 8:53 AM [place] Pager [phone] Office [phone]

## 2018-12-10 NOTE — Progress Notes (Signed)
Met with pt's wife at bedside. We discussed pt's continued need for vent, need for pressors, unable to tolerate hydrotherapy, unable to tolerate iHD, ongoing pain control issues and unable to tolerate position changes.  We discussed tracheostomy if unable to wean from vent and need for facility that could manage trach and iHD but pt has to tolerate iHD to even get to that point. She states "if we can just wean him off the medicine to keep his blood pressure up." I explained we are unable to do this and he cannot tolerate iHD. She states "if we can get him to sit in a chair or sit up." I explained he is unable to do so d/t wound and that this will likely never heal. I explained that we are at a point where we are asking her husband's body to do things it can no longer do on its own and that he is on maximum life support, has been on antibiotics for over 2 weeks and he is not getting better.  I explained that we need her input and decision making on how to proceed with continued care.  She states ideally she would like to get the breathing tube out to be able to ask the patient what he would like to do.  I explained to her that regardless we would need a plan if this were to fail and her wanting him to be reintubated and tracheostomy which puts [place] back in the same discussion as previously noted.  She verbalized understanding of this.  After extensive discussion she then asked if she were to decide to make the patient comfortable with family be able to see him.  I advised that they would.  Offered support which was well received.  I advised her that we will have ongoing discussions.  Will ask palliative care to follow-up with the patient and family as well.  I spent 25 minutes in direct family counseling care including reviewing clinical data with her. Time is exclusive to this patient and does not include procedures.

## 2018-12-10 NOTE — Progress Notes (Addendum)
NAME:  [name], MRN:  [id number], DOB:  09-Mar-1955, LOS: 10 ADMISSION DATE:  11/10/2018, CONSULTATION DATE:  10/8 REFERRING MD:  Dr, Tawanna Solo, Amrit , CHIEF COMPLAINT:  Acute on chronic respiratory failure   Brief History   Patient is a 3-year male with history of ESRD on HD, peripheral vascular disease s/p left AKA, R femur fx, diabetes, morbid obesity, HTN, HLD who presented with infected sacral ulcer.  Patient is largely bedbound and sometimes ambulates with a wheelchair. He reported developing a small ulcer about 4 weeks ago which has now progressed to stage IV sacral decubitus ulcer. There was no report of fever or chills at home.  Patient was admitted for the management of decubitus ulcer.  Intubated, extubated and reintubated.   History of present illness   10/8 patient had increasing hypoxia and lethargic requiring intubation. Unable to tolerate scheduled HD 2/2 hypotension. PCCM was consulted for management.   Past Medical History  Type 2 diabetes mellitus with diabetic nephropathy, with long-term current use of insulin (HCC)   OBESITY   Essential hypertension   End stage renal disease (HCC)   Chronic venous insufficiency   Unstageable pressure ulcer of right buttock (HCC)   Closed fracture of distal end of right femur (HCC)   S/P AKA (above knee amputation) unilateral, left ([place])   Decubitus ulcer of sacral region, stage 4 ([place])   DNR (do not resuscitate) discussion   Chronic respiratory failure with hypoxia ([place])   Adult failure to thrive  Significant Hospital Events   10/8 intubated, extubated 10/11 10/11 Reintubated  Consults:  PCCM Ortho surgery General surgery Palliative care Nephrology  Procedures:  Dialysis Echo  Debridement   Significant Diagnostic Tests:  10/8- CTA chest 9/30-CT [id number] removal of the previously demonstrated pigtail pleural drain with a small residual collection along the posterior border of the right lobe liver  measuring up to 1.8 x 3.8 x 2.5 cm in size, with a small amount of peripheral rim enhancement. Findings are concerning for residual abscess or infection.  10/12 CT AP: 1. No substantial interval change in exam. The small fluid collection adjacent to the posterior right liver is stable to minimally smaller in the interval. 2. Stable bilateral pleural effusions with bibasilar collapse/consolidation, right greater than left. 3. 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 4. Persistent periureteric soft tissue potentially related to edema or inflammation without overt hydroureteronephrosis. Patient is status post cystectomy with ileal diversion. 5. Right groin hernia contains sigmoid colon with some fluid in the hernia sac. Imaging features are similar to prior and there is no colonic dilatation proximal to the hernia to suggest obstruction. 6. Left groin hernia contains only fat. 7. Abdominal aortic atherosclerosis. 8. Diffuse body wall edema.  Micro Data:  10/8 Blood Cx>>NGTD 12/06/2018 right pleural fluid>>NGTD 10/11 tracheal aspirate>>GNR. GPC, GPR, yeast   Antimicrobials:  9/27 vancomycin>> 9/27 cefepime>>  Interim history/subjective:  Extremely anxious, wants ETT out. Remains on 2 pressors. Remains on CRRT.  WBCs down: 36.5->22.7. Afebrile.   Objective   Blood pressure (!) 114/19, pulse (!) 103, temperature 98.1 F (36.7 C), temperature source Axillary, resp. rate (!) 21, height 5\' 5"  (1.651 m), weight 118.1 kg, SpO2 100 %.    Vent Mode: PRVC FiO2 (%):  [40 %] 40 % Set Rate:  [16 bmp] 16 bmp Vt Set:  [490 mL] 490 mL PEEP:  [5 cmH20] 5 cmH20 Pressure Support:  [10 cmH20] 10 cmH20 Plateau Pressure:  [20 cmH20-22 cmH20]  22 cmH20   Intake/Output Summary (Last 24 hours) at 12/10/2018 0741 Last data filed at 12/10/2018 0700 Gross per 24 hour  Intake 3257.09 ml  Output 5045 ml  Net -1787.91 ml   Filed Weights   12/08/18 0258 12/09/18 0352  12/10/18 0305  Weight: 121.3 kg 122.1 kg 118.1 kg    Examination: General: Alert, anxious, pointing to ETT, wants it out, wants to be suctioned-no significant sputum oral or ETT HEENT: Normocephalic. PERRL. Neuro: A&O. Follows commands. Communicates with nods, pointing CV: IRRR. S1S2.  PULM: FNL, symmetrical. Coarse rhonchi. Vent supported GI: hypoactive BS x4. SNT/ND. Urostomy pouch in place.  Extremities: Left BKA, right lower extremity with vascular ischemia, B/L upper extremity edema.  Skin: Thin, fragile skin. Warm. LUE breakdown noted with dressing in place.  Did not visualize sacral wound.   Resolved Hospital Problem list     Assessment & Plan:  [name] is a 63yo male presenting to PCCM for management of intubation and vent settings.   Acute on chronic hypoxic respiratory failure: s/p extubation and reintubation. Remains on vent at 40% FiO2 and 98% O2 sats. Baseline is 4L [place]. H/o OSA, pulm HTN, HFrEF, cardiorenal syndrome. CT 9/30 revealed new consolidation of LLL with air bronchograms with moderate right and small left pleural effusions and atelectasis. CTA chest 10/8 revealed no PE. Today's CXR still significant pleural effusion R>L with atelectasis.  Extremely anxious this am Plan: He wants ETT out. Will d/w he and wife both as this will be one-way extubation and would recommend comfort measures.  Add precedex for now.  For now continue ventilator support to prevent eminent deterioration and further organ dysfunction from hypoxemia and hypercarbia.   Patient is at risk for sudden hypoxia, barotrauma and hemodynamic compromise.   Maintain SpO2 greater than or equal to 90%. Head of bed elevated 30 degrees. Plateau pressures less than 30 cm H20.  Follow chest x-ray, ABG prn.   SAT/SBT as tolerated. Bronchial hygiene. RT/bronchodilator protocol.   Sepsis/septic shock:  Source likely sacral ulcers and/or abdominal abscess as seen on CT 9/30 and/or urostomy. Blood  cultures 10/8 NGTD. Has been on antimicrobial therapy since admission. WBC were up trending, now decreasing. Remains afebrile, no diarrhea. Sputum mixed GN/GP organisms. Continues to require 2 pressors. Repeat CT AP yesterday unrevealing.  Plan:  Continue vasopressin 0.04units/hr.  Wean levophed for MAP goal 60 and/or SBP >/=85 Continue midodrine Follow WBC, fever curve. F/U sputum speciation/sens. Will give abx holiday   Stage IV sacral decubitus ulcer: Underwent bedside debridement on 11/17/2018 by general surgery.  Plan  Followed by general surgery [place], hydrotherapy.  No other surgical interventions planned. Wound will likely never heal.  D/c abx for holiday starting today Pain management-he became delirious with Dilaudid.   Continue home oxycodone Continue fentanyl intravenously.    ESRD on hemodialysis: Normal TTS HD schedule. F/b/ Nephrology following and is on dialysis.  Dialysis being difficult due to hypotension.  Currently on CRRT as he is unable to tolerate iHD nor can he tolerate sitting in a chi Per nephrology  Diabetes type 2 CBG (last 3)  Recent Labs    12/10/18 0018 12/10/18 0408 12/10/18 0724  GLUCAP 206* 183* 209*   Sliding scale insulin protocol Add novolog 4U scheduled Q4H Lantus 23 units nightly  Hypothyroidism: chronic. Continue Synthroid  Chronic normocytic anemia: Recent Labs    12/09/18 0344 12/10/18 0611  HGB 8.3* 8.7*    Transfuse prn  Constipation 12/08/2018 placed on bowel regimen has not  had a BM since 12/04/2018  Atrial fibrillation-new onset Plan Continue amiodarone He is on heparin gtt for CRRT clotting   Goals of care: Palliative care following.  Pt is anxious to be extubated. If he meets readiness criteria will be one-way extubation and recommend comfort measures.   Best practice:  Diet: TF per recs  Pain/Anxiety/Delirium protocol (if indicated): fentanyl, precedex  VAP protocol (if indicated): in place DVT  prophylaxis: heparin gtt  GI prophylaxis: PPI Glucose control:  Lantus, scheduled Novolog and SSI Mobility: bed rest Code Status: DNR Family Communication: will update when she arrives today  Disposition: ICU  Labs   CBC: Recent Labs  Lab 12/05/18 0705  12/06/18 1255 12/07/18 0352 12/08/18 1136 12/08/18 1405 12/09/18 0344 12/10/18 0611  WBC 23.2*   < > 19.3* 18.6* 26.1*  --  36.5* 22.7*  NEUTROABS 21.1*  --   --   --  23.5*  --  33.2*  --   HGB 8.8*   < > 8.1* 8.2* 7.6* 10.2* 8.3* 8.7*  HCT 32.3*   < > 28.3* 29.6* 29.3* 30.0* 30.6* 31.6*  MCV 92.0   < > 88.7 90.8 95.8  --  91.9 92.4  PLT 135*   < > 141* 133* 88*  --  100* 84*   < > = values in this interval not displayed.    Basic Metabolic Panel: Recent Labs  Lab 12/05/18 1118  12/07/18 0352  12/08/18 1137 12/08/18 1405 12/08/18 1802 12/09/18 0344 12/09/18 1723 12/10/18 0611  NA 134*   < > 135   < > 136 137 135 136 136 136  K 4.7   < > 4.0   < > 4.2 3.7 3.8 4.4 4.2 4.5  CL 99   < > 100   < > 102  --  100 104 102 99  CO2 27   < > 24   < > 22  --  22 21* 21* 24  GLUCOSE 113*   < > 176*   < > 217*  --  169* 194* 185* 206*  BUN 21   < > 26*   < > 20  --  13 17 16 18   CREATININE 2.00*   < > 1.76*   < > 1.24  --  0.73 1.06 1.02 0.90  CALCIUM 8.3*   < > 7.8*   < > 7.4*  --  7.5* 7.0* 7.2* 7.5*  MG 1.9  --  2.0  --   --   --   --  2.1  --  2.2  PHOS 2.9   < > 1.4*   < > 2.6  --  1.2* 1.2* 2.0* 1.6*   < > = values in this interval not displayed.   GFR: Estimated Creatinine Clearance: 102.5 mL/min (by C-G formula based on SCr of 0.9 mg/dL). Recent Labs  Lab 12/05/18 1118 12/06/18 0252  12/07/18 0352 12/08/18 1136 12/09/18 0344 12/10/18 0611  PROCALCITON 5.17 5.53  --  4.06  --   --   --   WBC  --  20.6*   < > 18.6* 26.1* 36.5* 22.7*   < > = values in this interval not displayed.    Liver Function Tests: Recent Labs  Lab 12/08/18 1137 12/08/18 1802 12/09/18 0344 12/09/18 1723 12/10/18 0611  ALBUMIN 2.2*  2.5* 2.2* 2.2* 2.3*   ABG    Component Value Date/Time   PHART 7.266 (L) 12/08/2018 1405   PCO2ART 47.7 12/08/2018 1405   PO2ART 87.0  12/08/2018 1405   HCO3 21.7 12/08/2018 1405   TCO2 23 12/08/2018 1405   ACIDBASEDEF 5.0 (H) 12/08/2018 1405   O2SAT 95.0 12/08/2018 1405   CBG: Recent Labs  Lab 12/09/18 1504 12/09/18 1945 12/10/18 0018 12/10/18 0408 12/10/18 0724  GLUCAP 170* 173* 206* 183* 209*    The patient is critically ill with respiratory failure and shock. He requires ongoing ICU for high complexity decision making, titration of high alert medications, ventilator management, titration of oxygen and interpretation of advanced monitoring.    I personally spent 35 minutes providing critical care services including personally reviewing test results, discussing care with nursing staff/other physicians and completing orders pertaining to this patient.  Time was exclusive to the patient and does not include time spent teaching or in procedures.  Voice recognition software was used in the production of this record.  Errors in interpretation may have been inadvertently missed during review.  Francine Graven, MSN, AGACNP  [facility]  Attending Note:  63 year old male with extensive PMH to include ESRD-HD and severe PVD s/p left leg amputation who presents to PCCM with respiratory failure, septic shock and requiring CRRT.  Patient is now off pressors but remains very agitated on exam with coarse BS diffusely.  I reviewed CXR myself, ETT is in a good position.  Atelectasis noted.  Discussed with PCCM-NP.  Patient is a full DNR at this point.  I do not believe he will make any further progress at this point and tracheostomy would not be of benefit on anything but prolonging suffering.  Wife is to arrive to the ICU at 10 AM and will have discussions regarding plan of care, will recommend one way extubation and once deteriorates focus more on comfort.  Will involve  palliative care as well for symptom management.  The patient is critically ill with multiple organ systems failure and requires high complexity decision making for assessment and support, frequent evaluation and titration of therapies, application of advanced monitoring technologies and extensive interpretation of multiple databases.   Critical Care Time devoted to patient care services described in this note is  32  Minutes. This time reflects time of care of this signee Dr Jennet Maduro. This critical care time does not reflect procedure time, or teaching time or supervisory time of PA/NP/Med student/Med Resident etc but could involve care discussion time.  Rush Farmer, M.D. [facility]/Critical Care Medicine. Pager: [phone]. After hours pager: [phone].

## 2018-12-11 DIAGNOSIS — Z7189 Other specified counseling: Secondary | ICD-10-CM

## 2018-12-11 DIAGNOSIS — Z515 Encounter for palliative care: Secondary | ICD-10-CM

## 2018-12-11 DIAGNOSIS — I872 Venous insufficiency (chronic) (peripheral): Secondary | ICD-10-CM

## 2018-12-11 LAB — CBC
HCT: 30.3 % — ABNORMAL LOW (ref 39.0–52.0)
Hemoglobin: 8 g/dL — ABNORMAL LOW (ref 13.0–17.0)
MCH: 24.9 pg — ABNORMAL LOW (ref 26.0–34.0)
MCHC: 26.4 g/dL — ABNORMAL LOW (ref 30.0–36.0)
MCV: 94.4 fL (ref 80.0–100.0)
Platelets: 102 10*3/uL — ABNORMAL LOW (ref 150–400)
RBC: 3.21 MIL/uL — ABNORMAL LOW (ref 4.22–5.81)
RDW: 21.1 % — ABNORMAL HIGH (ref 11.5–15.5)
WBC: 20.6 10*3/uL — ABNORMAL HIGH (ref 4.0–10.5)
nRBC: 0.6 % — ABNORMAL HIGH (ref 0.0–0.2)

## 2018-12-11 LAB — RENAL FUNCTION PANEL
Albumin: 2.3 g/dL — ABNORMAL LOW (ref 3.5–5.0)
Anion gap: 15 (ref 5–15)
BUN: 17 mg/dL (ref 8–23)
CO2: 20 mmol/L — ABNORMAL LOW (ref 22–32)
Calcium: 7.4 mg/dL — ABNORMAL LOW (ref 8.9–10.3)
Chloride: 101 mmol/L (ref 98–111)
Creatinine, Ser: 0.72 mg/dL (ref 0.61–1.24)
GFR calc Af Amer: >60 mL/min (ref >60)
GFR calc non Af Amer: >60 mL/min (ref >60)
Glucose, Bld: 193 mg/dL — ABNORMAL HIGH (ref 70–99)
Phosphorus: 1.8 mg/dL — ABNORMAL LOW (ref 2.5–4.6)
Potassium: 4.4 mmol/L (ref 3.5–5.1)
Sodium: 136 mmol/L (ref 135–145)

## 2018-12-11 LAB — GLUCOSE, CAPILLARY
Glucose-Capillary: 174 mg/dL — ABNORMAL HIGH (ref 70–99)
Glucose-Capillary: 191 mg/dL — ABNORMAL HIGH (ref 70–99)
Glucose-Capillary: 211 mg/dL — ABNORMAL HIGH (ref 70–99)

## 2018-12-11 LAB — MAGNESIUM: Magnesium: 2.2 mg/dL (ref 1.7–2.4)

## 2018-12-11 LAB — APTT: aPTT: 108 seconds — ABNORMAL HIGH (ref 24–36)

## 2018-12-11 MED ORDER — SODIUM PHOSPHATES 45 MMOLE/15ML IV SOLN
30.0000 mmol | Freq: Once | INTRAVENOUS | Status: DC
  Administered 2018-12-11: 08:00:00 30 mmol via INTRAVENOUS
  Filled 2018-12-11: qty 10

## 2018-12-11 MED ORDER — MORPHINE SULFATE (PF) 2 MG/ML IV SOLN
1.0000 mg | INTRAVENOUS | Status: DC | PRN
  Administered 2018-12-11 – 2018-12-12 (×2): 1 mg via INTRAVENOUS
  Filled 2018-12-11 (×2): qty 1

## 2018-12-11 MED FILL — Fentanyl Citrate Preservative Free (PF) Inj 2500 MCG/50ML: INTRAMUSCULAR | Qty: 50 | Status: AC

## 2018-12-11 MED FILL — Sodium Chloride IV Soln 0.9%: INTRAVENOUS | Qty: 250 | Status: AC

## 2018-12-11 NOTE — Progress Notes (Addendum)
PT/Hydrotherapy Cancellation Note  Patient Details Name: [name] MRN: [id number] DOB: 05-22-1955   Cancelled Treatment:    Reason Eval/Treat Not Completed: Medical issues which prohibited therapy. Pt remains medically tenuous and with poor prognosis. Will again defer hydrotherapy and nursing to change dressing.  Addendum 1214: Pt to transition to comfort care. Will sign off.   Shary Decamp [facility] 12/11/2018, 9:11 AM [name] Pager [phone] Office [phone]

## 2018-12-11 NOTE — Progress Notes (Signed)
Palliative Medicine RN Note: Mrs Lipper called. Plan to extubate tomorrow. Discussed the visitation policy & reinforced that only 4 visitors are allowed per hospital policy.  Marjie Skiff Ulric Salzman, RN, BSN, [facility] Team 12/11/2018 12:59 PM Office [phone]

## 2018-12-11 NOTE — Progress Notes (Addendum)
NAME:  [name], MRN:  [id number], DOB:  12/29/1955, LOS: 30 ADMISSION DATE:  11/03/2018, CONSULTATION DATE:  10/8 REFERRING MD:  Dr, Tawanna Solo, Amrit , CHIEF COMPLAINT:  Acute on chronic respiratory failure   Brief History   Patient is a 89-year male with history of ESRD on HD, peripheral vascular disease s/p left AKA, R femur fx, diabetes, morbid obesity, HTN, HLD who presented with infected sacral ulcer.  Patient is largely bedbound and sometimes ambulates with a wheelchair. He reported developing a small ulcer about 4 weeks ago which has now progressed to stage IV sacral decubitus ulcer. There was no report of fever or chills at home.  Patient was admitted for the management of decubitus ulcer.  Intubated, extubated and reintubated.   History of present illness   10/8 patient had increasing hypoxia and lethargic requiring intubation. Unable to tolerate scheduled HD 2/2 hypotension. PCCM was consulted for management.   Past Medical History  Type 2 diabetes mellitus with diabetic nephropathy, with long-term current use of insulin (HCC)   OBESITY   Essential hypertension   End stage renal disease (HCC)   Chronic venous insufficiency   Unstageable pressure ulcer of right buttock (HCC)   Closed fracture of distal end of right femur (HCC)   S/P AKA (above knee amputation) unilateral, left ([place])   Decubitus ulcer of sacral region, stage 4 ([place])   DNR (do not resuscitate) discussion   Chronic respiratory failure with hypoxia ([place])   Adult failure to thrive  Significant Hospital Events   10/8 intubated, extubated 10/11 10/11 Reintubated  Consults:  PCCM Ortho surgery General surgery Palliative care Nephrology  Procedures:  Dialysis Echo  Debridement   Significant Diagnostic Tests:  10/8- CTA chest 9/30-CT [id number] removal of the previously demonstrated pigtail pleural drain with a small residual collection along the posterior border of the right lobe liver  measuring up to 1.8 x 3.8 x 2.5 cm in size, with a small amount of peripheral rim enhancement. Findings are concerning for residual abscess or infection.  10/12 CT AP: 1. No substantial interval change in exam. The small fluid collection adjacent to the posterior right liver is stable to minimally smaller in the interval. 2. Stable bilateral pleural effusions with bibasilar collapse/consolidation, right greater than left. 3. 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 4. Persistent periureteric soft tissue potentially related to edema or inflammation without overt hydroureteronephrosis. Patient is status post cystectomy with ileal diversion. 5. Right groin hernia contains sigmoid colon with some fluid in the hernia sac. Imaging features are similar to prior and there is no colonic dilatation proximal to the hernia to suggest obstruction. 6. Left groin hernia contains only fat. 7. Abdominal aortic atherosclerosis. 8. Diffuse body wall edema.  Micro Data:  10/8 Blood Cx>>NGTD 12/06/2018 right pleural fluid>>NGTD 10/11 tracheal aspirate>>GNR. GPC, GPR, yeast   Antimicrobials:  9/27 vancomycin>>10/10 9/27 cefepime>>10/13  Interim history/subjective:  No significant changes.  Remains agitated, requiring low dose precedex.  Remains on 2 pressors. Remains on CRRT.  WBCs down: 36.5->22.7->20.6 Remains afebrile.   Objective   Blood pressure (!) 80/66, pulse 88, temperature (!) 96.5 F (35.8 C), temperature source Axillary, resp. rate (!) 23, height 5\' 5"  (1.651 m), weight 116.5 kg, SpO2 98 %.    Vent Mode: PRVC FiO2 (%):  [30 %-40 %] 30 % Set Rate:  [16 bmp] 16 bmp Vt Set:  [490 mL] 490 mL PEEP:  [5 cmH20] 5 cmH20 Pressure Support:  [10 cmH20]  [place] Pressure:  [14 cmH20-21 cmH20] 20 cmH20   Intake/Output Summary (Last 24 hours) at 12/11/2018 0720 Last data filed at 12/11/2018 0600 Gross per 24 hour  Intake 3263.82 ml  Output 5801 ml  Net  -2537.18 ml   Filed Weights   12/09/18 0352 12/10/18 0305 12/11/18 0440  Weight: 122.1 kg 118.1 kg 116.5 kg    Examination: General: chronically and acutely ill appearing, resting with eyes closed. NAD  HEENT: Normocephalic. PERRL. Neuro: Opens eyes with stimulation. Follows commands. Communicates with nods. CV: IRRR. S1S2.  PULM: FNL, symmetrical. Coarse rhonchi. Vent supported Minimal secretions suctioned via ETT GI: hypoactive BS x4. SNT/ND. Urostomy pouch in place.  Extremities: Left BKA, right lower extremity with vascular ischemia, B/L upper extremity edema.  Skin: Thin, fragile skin. Warm. LUE breakdown noted with dressing in place.  Did not visualize sacral wound.   Resolved Hospital Problem list     Assessment & Plan:  [name] is a 63yo male presenting to PCCM for management of intubation and vent settings.   Acute on chronic hypoxic respiratory failure: s/p extubation and reintubation. Remains on vent at 40% FiO2. Baseline is 4L [place]. H/o OSA, pulm HTN, HFrEF, cardiorenal syndrome. CT 9/30 revealed new consolidation of LLL with air bronchograms with moderate right and small left pleural effusions and atelectasis. CTA chest 10/8 revealed no PE.   He would likely immediately fail extubation d/t a/c resp failure, deconditioning, recurrent pleural effusions as we are unable to remove volume via CRRT d/t hypotension. Plan: Continue precedex for goal RASS 0 to -1.  Continue ventilator support to prevent eminent deterioration and further organ dysfunction from hypoxemia and hypercarbia.   Patient is at risk for sudden hypoxia, barotrauma and hemodynamic compromise.   Maintain SpO2 greater than or equal to 90%. Head of bed elevated 30 degrees. Plateau pressures less than 30 cm H20.  Follow chest x-ray, ABG prn.   SAT/SBT as tolerated, see above. Bronchial hygiene. RT/bronchodilator protocol. He is not really a trach candidate given his multiple co-morbidities. Could not  realistically consider unless able to tolerate iHD which would give realistic disposition plan to get out of hospital which is unlikely at this point.    Sepsis/septic shock:  Source likely sacral ulcers and/or abdominal abscess as seen on CT 9/30 and/or urostomy. Blood cultures 10/8 NGTD. Has been on antimicrobial therapy since admission. WBC were up trending, now continuing to decrease. Remains afebrile, no diarrhea. Sputum mixed GN/GP organisms. Continues to require 2 pressors. Repeat CT AP unrevealing.  Plan:  Continue vasopressin 0.04units/hr.  Wean levophed for MAP goal 60 and/or SBP >/=85 Continue midodrine Follow WBC, fever curve. F/U sputum speciation/sens. Will continue to observe off abx for now.    Stage IV sacral decubitus ulcer: Underwent bedside debridement on 11/17/2018 by general surgery.  Plan  Followed by general surgery, now intermittently.  [place], hydrotherapy.  No other surgical interventions planned. Wound will likely never heal.  Pain management-he became delirious with Dilaudid.   Continue home oxycodone Continue fentanyl intravenously.    ESRD on hemodialysis: Normal TTS HD schedule. F/b Nephrology following and is on dialysis.  Dialysis being difficult due to hypotension.  Currently on CRRT as he is unable to tolerate iHD nor can he tolerate sitting in a chair. CRRT Per nephrology  Diabetes type 2 CBG (last 3)  Recent Labs    12/10/18 1957 12/10/18 2326 12/11/18 0434  GLUCAP 114* 156* 174*   Sliding scale insulin protocol Novolog 4U scheduled  Q4H Lantus 23 units nightly  Hypothyroidism: chronic. Continue Synthroid  Chronic normocytic anemia: Thrombocytopenia Recent Labs    12/10/18 0611 12/11/18 0343  HGB 8.7* 8.0*  Results for DREAN, REMME (MRN [id number]) as of 12/11/2018 07:17  Ref. Range 12/08/2018 11:36 12/08/2018 14:05 12/09/2018 03:44 12/10/2018 06:11 12/11/2018 03:43  Platelets Latest Ref Range: 150 - 400 K/uL 88 (L)  100  (L) 84 (L) 102 (L)  Monitor Transfuse prn  Constipation 12/08/2018 placed on bowel regimen has not had a BM since 12/04/2018, smear only 10/13  Atrial fibrillation-new onset Plan Continue amiodarone He is on heparin gtt for CRRT clotting   Goals of care: Palliative care following.  Ongoing GOC d/w wife. She has unrealistic expectations for recovery. Unfortunately, we are near the point of futile care as Mr Schuller shows no signs of recovery from MSOF.  Best practice:  Diet: TF per recs  Pain/Anxiety/Delirium protocol (if indicated): fentanyl, precedex  VAP protocol (if indicated): in place DVT prophylaxis: heparin gtt  GI prophylaxis: PPI Glucose control:  Lantus, scheduled Novolog and SSI Mobility: bed rest Code Status: DNR Family Communication: will update when wife arrives today  Disposition: ICU  Labs   CBC: Recent Labs  Lab 12/05/18 0705  12/07/18 0352 12/08/18 1136 12/08/18 1405 12/09/18 0344 12/10/18 0611 12/11/18 0343  WBC 23.2*   < > 18.6* 26.1*  --  36.5* 22.7* 20.6*  NEUTROABS 21.1*  --   --  23.5*  --  33.2*  --   --   HGB 8.8*   < > 8.2* 7.6* 10.2* 8.3* 8.7* 8.0*  HCT 32.3*   < > 29.6* 29.3* 30.0* 30.6* 31.6* 30.3*  MCV 92.0   < > 90.8 95.8  --  91.9 92.4 94.4  PLT 135*   < > 133* 88*  --  100* 84* 102*   < > = values in this interval not displayed.    Basic Metabolic Panel: Recent Labs  Lab 12/05/18 1118  12/07/18 0352  12/09/18 0344 12/09/18 1723 12/10/18 0611 12/10/18 1706 12/11/18 0343  NA 134*   < > 135   < > 136 136 136 136 136  K 4.7   < > 4.0   < > 4.4 4.2 4.5 4.4 4.4  CL 99   < > 100   < > 104 102 99 103 101  CO2 27   < > 24   < > 21* 21* 24 23 20*  GLUCOSE 113*   < > 176*   < > 194* 185* 206* 152* 193*  BUN 21   < > 26*   < > 17 16 18 19 17   CREATININE 2.00*   < > 1.76*   < > 1.06 1.02 0.90 0.81 0.72  CALCIUM 8.3*   < > 7.8*   < > 7.0* 7.2* 7.5* 7.2* 7.4*  MG 1.9  --  2.0  --  2.1  --  2.2  --  2.2  PHOS 2.9   < > 1.4*   < > 1.2*  2.0* 1.6* 2.3* 1.8*   < > = values in this interval not displayed.   GFR: Estimated Creatinine Clearance: 114.5 mL/min (by C-G formula based on SCr of 0.72 mg/dL). Recent Labs  Lab 12/05/18 1118 12/06/18 0252  12/07/18 0352 12/08/18 1136 12/09/18 0344 12/10/18 0611 12/11/18 0343  PROCALCITON 5.17 5.53  --  4.06  --   --   --   --   WBC  --  20.6*   < >  18.6* 26.1* 36.5* 22.7* 20.6*   < > = values in this interval not displayed.    Liver Function Tests: Recent Labs  Lab 12/09/18 0344 12/09/18 1723 12/10/18 0611 12/10/18 1706 12/11/18 0343  ALBUMIN 2.2* 2.2* 2.3* 2.2* 2.3*   ABG    Component Value Date/Time   PHART 7.266 (L) 12/08/2018 1405   PCO2ART 47.7 12/08/2018 1405   PO2ART 87.0 12/08/2018 1405   HCO3 21.7 12/08/2018 1405   TCO2 23 12/08/2018 1405   ACIDBASEDEF 5.0 (H) 12/08/2018 1405   O2SAT 95.0 12/08/2018 1405   CBG: Recent Labs  Lab 12/10/18 1118 12/10/18 1516 12/10/18 1957 12/10/18 2326 12/11/18 0434  GLUCAP 162* 148* 114* 156* 174*    The patient is critically ill with respiratory failure and shock. He requires ongoing ICU for high complexity decision making, titration of high alert medications, ventilator management, titration of oxygen and interpretation of advanced monitoring.    I personally spent 35 minutes providing critical care services including personally reviewing test results, discussing care with nursing staff/other physicians and completing orders pertaining to this patient.  Time was exclusive to the patient and does not include time spent teaching or in procedures.  Voice recognition software was used in the production of this record.  Errors in interpretation may have been inadvertently missed during review.  Francine Graven, MSN, AGACNP  [facility]  Attending Note:  63 year old male with extensive PMH who remains on CRRT and high dose pressors with no signs of improvement.  Overnight, remains on high dose  pressors but somewhat arousable.  On exam, patient is arousable but not following commands.  I reviewed CXR myself, ETT is in a good position.  Discussed with PCCM-NP, renal MD and family.  Will d/c CRRT.  Start PRN morphine.  No further escalation of care at this point.  Plan to progress to comfort care in AM when family is more available.  Discussed with wife.  The patient is critically ill with multiple organ systems failure and requires high complexity decision making for assessment and support, frequent evaluation and titration of therapies, application of advanced monitoring technologies and extensive interpretation of multiple databases.   Critical Care Time devoted to patient care services described in this note is  32  Minutes. This time reflects time of care of this signee Dr Jennet Maduro. This critical care time does not reflect procedure time, or teaching time or supervisory time of PA/NP/Med student/Med Resident etc but could involve care discussion time.  Rush Farmer, M.D. [facility]. Pager: [phone]. After hours pager: [phone].

## 2018-12-11 NOTE — Progress Notes (Addendum)
[facility] ROUNDING NOTE   Subjective:   This is a 63 year old gentleman with end-stage renal disease usually Tuesday Thursday Saturday dialysis.  He has a history of peripheral vascular status post left AKA right femoral fracture diabetes, morbid obesity hypertension hyperlipidemia.  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  It appears that he has chronic hypotension and has been on midodrine.  His home dialysis unit is [facility]  He has limited functional status is mostly wheelchair/bedbound.  He has a history of a cystectomy and urostomy in the past.  His hospital course has been complicated by respiratory failure, this was thought to be secondary to volume overload in setting of ESRD and hypertension he underwent a right thoracentesis and required intubation on 12/05/2018.  It appears that he was extubated on 12/08/2018, he failed extubation and is now reintubated..  Due to his declining status he was placed on CRRT.  There have been ongoing discussions with the family in terms of withdrawal of dialytic support.  Blood pressure 106/36 pulse 90 temperature 96.5 O2 sats 100% 30 % FiO2  Sodium 136 potassium 4.4 chloride 101 CO2 20 BUN 17 creatinine 0.72 glucose 193 calcium 7.4 albumin 2.3 phosphorus 1.8 WBC 20.6 hemoglobin 8.0 platelets 102  IV amiodarone IV vasopressin IV norepinephrine  Darbepoetin 200 mcg last dose 12/07/2018, Lantus 23 units daily subcu, levothyroxine 62.5 mcg daily, midodrine 15 mg 3 times daily, Protonix 40 mg daily   Repeat CT abdomen 12/09/2018 no substantial interval change.   Objective:  Vital signs in last 24 hours:  Temp:  [96 F (35.6 C)-97.4 F (36.3 C)] 96.5 F (35.8 C) (10/14 0436) Pulse Rate:  [83-104] 88 (10/14 0600) Resp:  [12-26] 23 (10/14 0600) BP: (68-128)/(20-97) 80/66 (10/14 0600) SpO2:  [91 %-100 %] 98 % (10/14 0600) FiO2 (%):  [30 %-40 %] 30 % (10/14  0600) Weight:  [116.5 kg] 116.5 kg (10/14 0440)  Weight change: -1.6 kg Filed Weights   12/09/18 0352 12/10/18 0305 12/11/18 0440  Weight: 122.1 kg 118.1 kg 116.5 kg    Intake/Output: I/O last 3 completed shifts: In: 4735.5 [I.V.:3000.4; NG/GT:1325; IV Piggyback:410.1] Out: 8727 [AOZHY:8657]   Intake/Output this shift:  No intake/output data recorded.  General: Obese, chronically ill appearing male  Heart: S1,S2 RRR Lungs:   sounds bilaterally Abdomen: Active BS, obese, NT, subcutaneous bruising  Extremities: L AKA with 2+ stump edema. RLE 1+ edema, cool leg, mottling, dusky in appearance. Dialysis Access: R AVF + bruit, aneurysmal SKIN: large stage IV sacral decub, , L arm with + redness and weeping and several ulcerations of skin.     Basic Metabolic Panel: Recent Labs  Lab 12/05/18 1118  12/07/18 0352  12/09/18 0344 12/09/18 1723 12/10/18 0611 12/10/18 1706 12/11/18 0343  NA 134*   < > 135   < > 136 136 136 136 136  K 4.7   < > 4.0   < > 4.4 4.2 4.5 4.4 4.4  CL 99   < > 100   < > 104 102 99 103 101  CO2 27   < > 24   < > 21* 21* 24 23 20*  GLUCOSE 113*   < > 176*   < > 194* 185* 206* 152* 193*  BUN 21   < > 26*   < > '17 16 18 19 17  ' CREATININE 2.00*   < > 1.76*   < > 1.06  1.02 0.90 0.81 0.72  CALCIUM 8.3*   < > 7.8*   < > 7.0* 7.2* 7.5* 7.2* 7.4*  MG 1.9  --  2.0  --  2.1  --  2.2  --  2.2  PHOS 2.9   < > 1.4*   < > 1.2* 2.0* 1.6* 2.3* 1.8*   < > = values in this interval not displayed.    Liver Function Tests: Recent Labs  Lab 12/09/18 0344 12/09/18 1723 12/10/18 0611 12/10/18 1706 12/11/18 0343  ALBUMIN 2.2* 2.2* 2.3* 2.2* 2.3*   No results for input(s): LIPASE, AMYLASE in the last 168 hours. No results for input(s): AMMONIA in the last 168 hours.  CBC: Recent Labs  Lab 12/05/18 0705  12/07/18 0352 12/08/18 1136 12/08/18 1405 12/09/18 0344 12/10/18 0611 12/11/18 0343  WBC 23.2*   < > 18.6* 26.1*  --  36.5* 22.7* 20.6*  NEUTROABS 21.1*  --    --  23.5*  --  33.2*  --   --   HGB 8.8*   < > 8.2* 7.6* 10.2* 8.3* 8.7* 8.0*  HCT 32.3*   < > 29.6* 29.3* 30.0* 30.6* 31.6* 30.3*  MCV 92.0   < > 90.8 95.8  --  91.9 92.4 94.4  PLT 135*   < > 133* 88*  --  100* 84* 102*   < > = values in this interval not displayed.    Cardiac Enzymes: No results for input(s): CKTOTAL, CKMB, CKMBINDEX, TROPONINI in the last 168 hours.  BNP: Invalid input(s): POCBNP  CBG: Recent Labs  Lab 12/10/18 1118 12/10/18 1516 12/10/18 1957 12/10/18 2326 12/11/18 0434  GLUCAP 162* 148* 114* 156* 174*    Microbiology: Results for orders placed or performed during the hospital encounter of 11/25/2018  SARS CORONAVIRUS 2 (TAT 6-24 HRS) Nasopharyngeal Nasopharyngeal Swab     Status: None   Collection Time: 10/29/2018  3:10 PM   Specimen: Nasopharyngeal Swab  Result Value Ref Range Status   SARS Coronavirus 2 NEGATIVE NEGATIVE Final    Comment: (NOTE) SARS-CoV-2 target nucleic acids are NOT DETECTED. The SARS-CoV-2 RNA is generally detectable in upper and lower respiratory specimens during the acute phase of infection. Negative results do not preclude SARS-CoV-2 infection, do not rule out co-infections with other pathogens, and should not be used as the sole basis for treatment or other patient management decisions. Negative results must be combined with clinical observations, patient history, and epidemiological information. The expected result is Negative. Fact Sheet for Patients: SugarRoll.be Fact Sheet for Healthcare Providers: https://www.woods-mathews.com/ This test is not yet approved or cleared by the [place] FDA and  has been authorized for detection and/or diagnosis of SARS-CoV-2 by FDA under an Emergency Use Authorization (EUA). This EUA will remain  in effect (meaning this test can be used) for the duration of the COVID-19 declaration under Section 56 4(b)(1) of the Act, 21 U.S.C. section  360bbb-3(b)(1), unless the authorization is terminated or revoked sooner. Performed at [facility], [place] [address], [place], [place] [address]   Culture, blood (routine x 2)     Status: None   Collection Time: 12/05/18 11:20 AM   Specimen: BLOOD LEFT HAND  Result Value Ref Range Status   Specimen Description BLOOD LEFT HAND  Final   Special Requests   Final    BOTTLES DRAWN AEROBIC ONLY Blood Culture results may not be optimal due to an inadequate volume of blood received in culture bottles   Culture  Final    NO GROWTH 5 DAYS Performed at [facility], [place] [address] [place], [place] [address]    Report Status 12/10/2018 FINAL  Final  Culture, blood (routine x 2)     Status: None   Collection Time: 12/05/18 11:40 AM   Specimen: BLOOD LEFT ARM  Result Value Ref Range Status   Specimen Description BLOOD LEFT ARM  Final   Special Requests   Final    BOTTLES DRAWN AEROBIC ONLY Blood Culture results may not be optimal due to an inadequate volume of blood received in culture bottles   Culture   Final    NO GROWTH 5 DAYS Performed at [facility], [place] [address], [place], [place] [address]    Report Status 12/10/2018 FINAL  Final  Gram stain     Status: None   Collection Time: 12/06/18  4:19 PM   Specimen: PATH Cytology Pleural fluid  Result Value Ref Range Status   Specimen Description FLUID PLEURAL  Final   Special Requests NONE  Final   Gram Stain   Final    WBC PRESENT,BOTH PMN AND MONONUCLEAR NO ORGANISMS SEEN CYTOSPIN SMEAR Performed at [facility], [address] [place], [place] [address]    Report Status 12/06/2018 FINAL  Final  Culture, body fluid-bottle     Status: None (Preliminary result)   Collection Time: 12/06/18  4:19 PM   Specimen: Fluid  Result Value Ref Range Status   Specimen Description FLUID PLEURAL  Final   Special Requests NONE  Final   Culture   Final    NO GROWTH 4 DAYS Performed at [facility]  [address] [place], [place] [address]    Report Status PENDING  Incomplete  Culture, respiratory (non-expectorated)     Status: None (Preliminary result)   Collection Time: 12/08/18  8:00 AM   Specimen: Tracheal Aspirate; Respiratory  Result Value Ref Range Status   Specimen Description TRACHEAL ASPIRATE  Final   Special Requests NONE  Final   Gram Stain   Final    ABUNDANT WBC PRESENT, PREDOMINANTLY PMN ABUNDANT GRAM NEGATIVE RODS ABUNDANT GRAM POSITIVE COCCI IN CLUSTERS FEW YEAST FEW GRAM POSITIVE RODS    Culture   Final    CULTURE REINCUBATED FOR BETTER GROWTH Performed at [facility], [place] [address], [place], [place] [address]    Report Status PENDING  Incomplete    Coagulation Studies: No results for input(s): LABPROT, INR in the last 72 hours.  Urinalysis: No results for input(s): COLORURINE, LABSPEC, PHURINE, GLUCOSEU, HGBUR, BILIRUBINUR, KETONESUR, PROTEINUR, UROBILINOGEN, NITRITE, LEUKOCYTESUR in the last 72 hours.  Invalid input(s): APPERANCEUR    Imaging: Ct Abdomen Pelvis Wo Contrast  Result Date: 12/09/2018 CLINICAL DATA:  Status post hepatic abscess drain. Sepsis. EXAM: CT ABDOMEN AND PELVIS WITHOUT CONTRAST TECHNIQUE: Multidetector CT imaging of the abdomen and pelvis was performed following the standard protocol without IV contrast. COMPARISON:  11/27/2018 FINDINGS: Lower chest: Bibasilar collapse/consolidation noted, right greater than left with some improvement in lingular aeration since prior. Bilateral pleural effusions, right greater than left, again noted. Hepatobiliary: The small fluid collection identified previously adjacent to the posterior right liver is stable to minimally smaller, measuring 1.9 x 3.3 cm today on axial imaging which compares to 1.8 x 3.8 cm previously. Gallbladder surgically absent. No intrahepatic or extrahepatic biliary dilation. Pancreas: No focal mass lesion. No dilatation of the main duct. No intraparenchymal cyst. No peripancreatic  edema. Spleen:  No splenomegaly. No focal mass lesion. Adrenals/Urinary Tract: No adrenal nodule or mass. Right renal parenchyma markedly atrophic with multiple renal cysts and/or dilated calices. Lower pole right renal stone is similar to prior. Similar appearance of marked left renal atrophy with cystic change. Ureters remain prominent with periureteric ill-defined soft tissue potentially related to edema or inflammation. Patient is status post cystectomy with right lower quadrant ileal diversion. Stomach/Bowel: NG tube tip is in the gastric antrum. Stomach largely decompressed. Duodenum is normally positioned as is the ligament of Treitz. Distal duodenal diverticulum evident. No small bowel wall thickening. No small bowel dilatation. The terminal ileum is normal. Appendix similar to prior without substantial edema or inflammation. Left colonic diverticulosis noted. The sigmoid colon is elongated and redundant and extends out into a right groin hernia. No colonic dilatation proximal to this hernia to suggest obstruction. There is some fluid in the hernia sac. Much of the inferior pelvis is obscured by beam hardening artifact from bilateral hip replacement. Vascular/Lymphatic: There is abdominal aortic atherosclerosis without aneurysm. Mild hepato duodenal ligament lymphadenopathy is stable. Mild para-aortic abdominal lymphadenopathy is stable. Index aortocaval node on 38/9 measures 1.3 cm short axis. Left para-aortic node measures 1.2 cm short axis on 36/9. There is mild common iliac lymphadenopathy and pelvic sidewall lymphadenopathy bilaterally, similar to prior. Although partially obscured pine a right common iliac node measures 2 cm short axis on 83/9, similar to prior. Lymphadenopathy in the groin regions bilaterally is similar to prior. Reproductive: Obscured by beam hardening artifact. Other: No intraperitoneal free fluid. Musculoskeletal: Left groin hernia contains only fat. Diffuse body wall edema noted.  Bilateral hip replacement. Bones are diffusely demineralized. Midline wound noted lower anterior abdominal wall. IMPRESSION: 1. No substantial interval change in exam. The small fluid collection adjacent to the posterior right liver is stable to minimally smaller in the interval. 2. Stable bilateral pleural effusions with bibasilar collapse/consolidation, right greater than left. 3. 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 4. Persistent periureteric soft tissue potentially related to edema or inflammation without overt hydroureteronephrosis. Patient is status post cystectomy with ileal diversion. 5. Right groin hernia contains sigmoid colon with some fluid in the hernia sac. Imaging features are similar to prior and there is no colonic dilatation proximal to the hernia to suggest obstruction. 6. Left groin hernia contains only fat. 7. Abdominal aortic atherosclerosis. 8. Diffuse body wall edema. Aortic Atherosclerosis (ICD10-I70.0). Electronically Signed   By: Misty Stanley M.D.   On: 12/09/2018 16:56   Dg Chest Port 1 View  Result Date: 12/10/2018 CLINICAL DATA:  Pleural effusion EXAM: PORTABLE CHEST 1 VIEW COMPARISON:  Abdominal CT from yesterday FINDINGS: Endotracheal tube tip at the clavicular heads. Right IJ line with tip at the upper SVC. The orogastric tube reaches the distal stomach. Cardiomegaly with hazy opacity at the right more than left lung base. This is pneumonia and atelectasis with trace pleural effusions by recent CT. IMPRESSION: 1. Stable hardware positioning. 2. Stable bilateral pulmonary opacification with atelectasis, pneumonia, and trace effusions by recent CT. Electronically Signed   By: Monte Fantasia M.D.   On: 12/10/2018 07:48     Medications:   .  prismasol BGK 4/2.5 500 mL/hr at 12/11/18 4709  .  prismasol BGK 4/2.5 200 mL/hr at 12/11/18 0700  . amiodarone 30 mg/hr (12/11/18 0600)  . dexmedetomidine (PRECEDEX) IV infusion 0.5 mcg/kg/hr (12/11/18  6283)  . fentaNYL infusion INTRAVENOUS 200 mcg/hr (12/11/18 0600)  . heparin 10,000 units/ 20 mL infusion syringe  1,900 Units/hr (12/11/18 0532)  . norepinephrine (LEVOPHED) Adult infusion 22 mcg/min (12/11/18 0600)  . prismasol BGK 4/2.5 2,000 mL/hr at 12/11/18 0534  . vasopressin (PITRESSIN) infusion - *FOR SHOCK* 0.04 Units/min (12/11/18 0600)   . chlorhexidine gluconate (MEDLINE KIT)  15 mL Mouth Rinse BID  . Chlorhexidine Gluconate Cloth  6 each Topical Q0600  . collagenase   Topical Daily  . darbepoetin (ARANESP) injection - DIALYSIS  200 mcg Intravenous Q Sat-HD  . docusate  50 mg Per Tube Daily  . feeding supplement (PRO-STAT SUGAR FREE 64)  60 mL Per Tube TID  . feeding supplement (VITAL 1.5 CAL)  1,000 mL Per Tube Q24H  . fentaNYL (SUBLIMAZE) injection  50 mcg Intravenous Once  . insulin aspart  0-9 Units Subcutaneous Q4H  . insulin aspart  4 Units Subcutaneous Q4H  . insulin glargine  23 Units Subcutaneous QHS  . levothyroxine  62.5 mcg Intravenous Daily  . mouth rinse  15 mL Mouth Rinse 10 times per day  . midodrine  15 mg Oral TID WC  . pantoprazole sodium  40 mg Per Tube Daily  . sennosides  10 mL Per Tube BID   acetaminophen **OR** acetaminophen, bisacodyl, fentaNYL, fentaNYL (SUBLIMAZE) injection, fentaNYL (SUBLIMAZE) injection, heparin, heparin, midazolam, ondansetron (ZOFRAN) IV, oxyCODONE, sodium chloride, sodium hypochlorite  Assessment/ Plan:   ESRD hemodialysis Tuesday Thursday Saturday last dialysis was completed 12/03/2018 has now been changed transition to CRRT 12/06/2018 for volume removal.  He is getting heparin through CRRT due to clotting of filter.  Hypoxic respiratory failure intubated status post thoracentesis with transudate.  Failed extubation 12/08/2018  Stage IV decubitus limited mobility wheelchair/bedbound status post bedside debridement hydrotherapy antibiotics discontinued 12/10/2018  Anemia Aranesp is being increased to 200 mcg  12/07/2018  Shock with hemodynamic instability requiring pressors vasopressin and Levophed  Hypophosphatemia 1.8 will replete with IV phosphorus  Atrial fibrillation with rapid rate IV amiodarone  Nutrition Albumin low protein support  Diabetes insulin per primary  Status post cystectomy and urostomy   Disposition unfortunate situation with very little chance of recovery will aim to discontinue CRRT in a.m.  I support decision of Dr. Nelda Marseille  Hypotension not really tolerating much in terms of fluid removal despite being on CRRT we will continue to follow     LOS: Elizabeth '@TODAY' '@7' :04 AM

## 2018-12-11 NOTE — Progress Notes (Signed)
Responded to Spiritual Consult.  Mrs. Hilmer at Mr. Leccese's bedside.  Mr. Dwinell intubated and unable to respond other than with his imploring eyes.  Mrs. Vanderhoef reluctant to talk openly and did not want to leave Mr. Dirosa.  Offered prayer at bedside.  [place] Chaplain Resident Pager:  [phone]

## 2018-12-12 LAB — CULTURE, BODY FLUID W GRAM STAIN -BOTTLE: Culture: NO GROWTH

## 2018-12-12 LAB — GLUCOSE, CAPILLARY
Glucose-Capillary: 238 mg/dL — ABNORMAL HIGH (ref 70–99)
Glucose-Capillary: 231 mg/dL — ABNORMAL HIGH (ref 70–99)
Glucose-Capillary: 244 mg/dL — ABNORMAL HIGH (ref 70–99)

## 2018-12-12 LAB — POCT ACTIVATED CLOTTING TIME
Activated Clotting Time: 224 seconds
Activated Clotting Time: 219 seconds
Activated Clotting Time: 208 seconds

## 2018-12-12 MED ORDER — ACETAMINOPHEN 650 MG RE SUPP: 650.0000 mg | Freq: Four times a day (QID) | RECTAL | Status: DC | PRN

## 2018-12-12 MED ORDER — MORPHINE BOLUS VIA INFUSION
5.0000 mg | INTRAVENOUS | Status: DC | PRN
  Administered 2018-12-12 (×2): 5 mg via INTRAVENOUS
  Filled 2018-12-12: qty 5

## 2018-12-12 MED ORDER — DIPHENHYDRAMINE HCL 50 MG/ML IJ SOLN: 25.0000 mg | INTRAMUSCULAR | Status: DC | PRN

## 2018-12-12 MED ORDER — MORPHINE 100MG IN NS 100ML (1MG/ML) PREMIX INFUSION
0.0000 mg/h | INTRAVENOUS | Status: DC
  Administered 2018-12-12: 2 mg/h via INTRAVENOUS
  Filled 2018-12-12: qty 100

## 2018-12-12 MED ORDER — GLYCOPYRROLATE 0.2 MG/ML IJ SOLN
0.2000 mg | INTRAMUSCULAR | Status: DC | PRN
  Filled 2018-12-12: qty 1

## 2018-12-12 MED ORDER — MORPHINE SULFATE (PF) 2 MG/ML IV SOLN
2.0000 mg | INTRAVENOUS | Status: AC | PRN
  Administered 2018-12-12 (×3): 2 mg via INTRAVENOUS

## 2018-12-12 MED ORDER — POLYVINYL ALCOHOL 1.4 % OP SOLN
1.0000 [drp] | Freq: Four times a day (QID) | OPHTHALMIC | Status: DC | PRN
  Filled 2018-12-12: qty 15

## 2018-12-12 MED ORDER — ACETAMINOPHEN 325 MG PO TABS: 650.0000 mg | ORAL_TABLET | Freq: Four times a day (QID) | ORAL | Status: DC | PRN

## 2018-12-12 MED ORDER — GLYCOPYRROLATE 1 MG PO TABS: 1.0000 mg | ORAL_TABLET | ORAL | Status: DC | PRN

## 2018-12-12 MED ORDER — GLYCOPYRROLATE 0.2 MG/ML IJ SOLN: 0.2000 mg | INTRAMUSCULAR | Status: DC | PRN

## 2018-12-12 MED ORDER — DEXTROSE 5 % IV SOLN: INTRAVENOUS | Status: DC

## 2018-12-17 LAB — CULTURE, RESPIRATORY W GRAM STAIN

## 2018-12-25 ENCOUNTER — Telehealth: Payer: Self-pay

## 2018-12-25 NOTE — Telephone Encounter (Signed)
Received signed dc back from Doctor Nelda Marseille. I called the funeral home to let them know the dc is ready and they are going to come by and pickup the dc.

## 2018-12-29 NOTE — Progress Notes (Signed)
Renal Navigator provided update to patient's OP HD clinic/[facility] [address] regarding transition to comfort care today.  Alphonzo Cruise, [place] Renal Navigator [phone]

## 2018-12-29 NOTE — Progress Notes (Addendum)
NAME:  [name], MRN:  [id number], DOB:  10-01-1955, LOS: 68 ADMISSION DATE:  11/18/2018, CONSULTATION DATE:  10/8 REFERRING MD:  Dr, Tawanna Solo, Amrit , CHIEF COMPLAINT:  Acute on chronic respiratory failure   Brief History   Patient is a 63-year male with history of ESRD on HD, peripheral vascular disease s/p left AKA, R femur fx, diabetes, morbid obesity, HTN, HLD who presented with infected sacral ulcer.  Patient is largely bedbound and sometimes ambulates with a wheelchair. He reported developing a small ulcer about 4 weeks ago which has now progressed to stage IV sacral decubitus ulcer. There was no report of fever or chills at home.  Patient was admitted for the management of decubitus ulcer.  Intubated, extubated and reintubated.   History of present illness   10/8 patient had increasing hypoxia and lethargic requiring intubation. Unable to tolerate scheduled HD 2/2 hypotension. PCCM was consulted for management.   Past Medical History  Type 2 diabetes mellitus with diabetic nephropathy, with long-term current use of insulin (HCC)   Obesity   Essential hypertension   End stage renal disease (HCC)   Chronic venous insufficiency   Unstageable pressure ulcer of right buttock (HCC)   Closed fracture of distal end of right femur (HCC)   S/P AKA (above knee amputation) unilateral, left ([place])   Decubitus ulcer of sacral region, stage 4 ([place])   DNR (do not resuscitate) discussion   Chronic respiratory failure with hypoxia ([place])   Adult failure to thrive  Significant Hospital Events   10/8 intubated, extubated 10/11 10/11 Reintubated  Consults:  PCCM Ortho surgery General surgery Palliative care Nephrology  Procedures:  Dialysis Echo  Debridement   Significant Diagnostic Tests:  10/8- CTA chest 9/30-CT [id number] removal of the previously demonstrated pigtail pleural drain with a small residual collection along the posterior border of the right lobe liver  measuring up to 1.8 x 3.8 x 2.5 cm in size, with a small amount of peripheral rim enhancement. Findings are concerning for residual abscess or infection.  10/12 CT AP: 1. No substantial interval change in exam. The small fluid collection adjacent to the posterior right liver is stable to minimally smaller in the interval. 2. Stable bilateral pleural effusions with bibasilar collapse/consolidation, right greater than left. 3. 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 4. Persistent periureteric soft tissue potentially related to edema or inflammation without overt hydroureteronephrosis. Patient is status post cystectomy with ileal diversion. 5. Right groin hernia contains sigmoid colon with some fluid in the hernia sac. Imaging features are similar to prior and there is no colonic dilatation proximal to the hernia to suggest obstruction. 6. Left groin hernia contains only fat. 7. Abdominal aortic atherosclerosis. 8. Diffuse body wall edema.  Micro Data:  10/8 Blood Cx>>NGTD 12/06/2018 right pleural fluid>>NGTD 10/11 tracheal aspirate>>GNR. GPC, GPR, yeast   Antimicrobials:  9/27 vancomycin>>10/10 9/27 cefepime>>10/13  Interim history/subjective:  -Elevated temperature to 37.8  -patient refusing dressing changes overnight  -Agitated overnight per RN  -Off CRRT   Objective   Blood pressure (!) 123/24, pulse 95, temperature 100 F (37.8 C), temperature source Oral, resp. rate 20, height 5\' 5"  (1.651 m), weight 117 kg, SpO2 98 %.    Vent Mode: PRVC FiO2 (%):  [30 %-40 %] 30 % Set Rate:  [16 bmp] 16 bmp Vt Set:  [490 mL] 490 mL PEEP:  [5 cmH20] 5 cmH20 Plateau Pressure:  [20 cmH20-21 cmH20] 20 cmH20   Intake/Output Summary (Last  24 hours) at 31-Dec-2018 0723 Last data filed at 12/31/18 0600 Gross per 24 hour  Intake 2417.15 ml  Output 768 ml  Net 1649.15 ml   Filed Weights   12/10/18 0305 12/11/18 0440 12/31/2018 0346  Weight: 118.1 kg 116.5 kg  117 kg    Examination: General: acute and chronically ill appearing, obese adult M, intubated in NAD  HEENT: NCAT. Trachea midline. ETT secure.  Neuro: Opens eyes spontaneously. PERRL. Following commands.  CV: s1s2 no rgm. Cap refill < 3 seconds  PULM: Symmetrical chest expansion. No accessory muscle use. Copious secretions.  GI: Soft round. Urostomy pouch. Hypoactive x4.  Extremities: L BKA. RLE with chronic cutaneous changes and is dusky in appearance. BUE edema  Skin: very thin and friable tissue. Warm to touch. Skin breakdown on LUE is dressed appropriately. Patient refusing turn for sacral visualization    Resolved Hospital Problem list     Assessment & Plan:    Acute on chronic hypoxic respiratory failure: -s/p reintubation  -hx OSA pulm HTN HFrEF cardiorenal syndrome  -baseline 4LNC  -CT 9/30 revealed new consolidation of LLL with air bronchograms with moderate right and small left pleural effusions and atelectasis.  -CTA chest 10/8 revealed no PE.   -He would likely immediately fail extubation d/t a/c resp failure, deconditioning, recurrent pleural effusions as we are unable to remove volume via CRRT d/t hypotension. -not candidate for trach Plan: -plan for 1-way extubation today -no SBT this morning; patient is comfortable appearing at present and prefer to prioritize comfort  -no further imaging   Septic shock -etiology likely sacral ulcers and/or abd abscess seen on CT, and or urostomy  Plan:  -Anticipating 1-way extubation 10/15 with likely EOL  -Given this, continue off abx  -Continue pressors at present  -no further labs  Stg IV sacral decubitus ulcer  Underwent bedside debridement on 11/17/2018 by general surgery.  Refusing dressing changes  Not candidate for further interventions, hydrotherapy Plan  -1-way extubation as above, patient with likely rapid decline following this; anticipate will reach EOL following extubation -Pain management for  comfort  ESRD on dialysis  Normal TTS HD schedule. Has been unable to tolerate iHD due to shock; unable to remove significant fluid on CRRT due to shock  Plan: -Nephrology to dc CRRT 10/15 given compassionate 1-way extubation  Diabetes type 2 Plan -no further glucose correction with expected EOL following 1-way extubation   Hypothyroidism Home synthroid Plan -no further synthroid with expected EOL following 1-way extubation   Chronic normocytic anemia: Thrombocytopenia Plan -no further monitoring due to 1-way extubation and likely EOL   Constipation 12/08/2018 placed on bowel regimen has not had a BM since 12/04/2018, smear only 10/13 Plan -Bowel regimen if constipation has associated discomfort   Atrial fibrillation Plan -no further chemical intervention for Afib with pending 1-way extubation   Goals of care:  -Palliative care following.  -Wife has held unrealistic expectations for recovery. -Unfortunately, we are near the point of futile care as Mr Hegge shows no signs of recovery from MSOF. -Per palliative care note 10/14, plan for 1-way extubation 10/15   Best practice:  Diet: npo Pain/Anxiety/Delirium protocol (if indicated): fentanyl, precedex, morphine  VAP protocol (if indicated): in place DVT prophylaxis: heparin through crrt circuit; will be discontinued  GI prophylaxis: PPI Glucose control:  Lantus, scheduled Novolog and SSI Mobility: bed rest Code Status: DNR Family Communication: pending wife arrival 10/15  Disposition: ICU  Labs   CBC: Recent Labs  Lab 12/07/18 [phone]  12/08/18 1136 12/08/18 1405 12/09/18 0344 12/10/18 0611 12/11/18 0343  WBC 18.6* 26.1*  --  36.5* 22.7* 20.6*  NEUTROABS  --  23.5*  --  33.2*  --   --   HGB 8.2* 7.6* 10.2* 8.3* 8.7* 8.0*  HCT 29.6* 29.3* 30.0* 30.6* 31.6* 30.3*  MCV 90.8 95.8  --  91.9 92.4 94.4  PLT 133* 88*  --  100* 84* 102*    Basic Metabolic Panel: Recent Labs  Lab 12/05/18 1118  12/07/18 0352   12/09/18 0344 12/09/18 1723 12/10/18 0611 12/10/18 1706 12/11/18 0343  NA 134*   < > 135   < > 136 136 136 136 136  K 4.7   < > 4.0   < > 4.4 4.2 4.5 4.4 4.4  CL 99   < > 100   < > 104 102 99 103 101  CO2 27   < > 24   < > 21* 21* 24 23 20*  GLUCOSE 113*   < > 176*   < > 194* 185* 206* 152* 193*  BUN 21   < > 26*   < > 17 16 18 19 17   CREATININE 2.00*   < > 1.76*   < > 1.06 1.02 0.90 0.81 0.72  CALCIUM 8.3*   < > 7.8*   < > 7.0* 7.2* 7.5* 7.2* 7.4*  MG 1.9  --  2.0  --  2.1  --  2.2  --  2.2  PHOS 2.9   < > 1.4*   < > 1.2* 2.0* 1.6* 2.3* 1.8*   < > = values in this interval not displayed.   GFR: Estimated Creatinine Clearance: 114.8 mL/min (by C-G formula based on SCr of 0.72 mg/dL). Recent Labs  Lab 12/05/18 1118 12/06/18 0252  12/07/18 0352 12/08/18 1136 12/09/18 0344 12/10/18 0611 12/11/18 0343  PROCALCITON 5.17 5.53  --  4.06  --   --   --   --   WBC  --  20.6*   < > 18.6* 26.1* 36.5* 22.7* 20.6*   < > = values in this interval not displayed.    Liver Function Tests: Recent Labs  Lab 12/09/18 0344 12/09/18 1723 12/10/18 0611 12/10/18 1706 12/11/18 0343  ALBUMIN 2.2* 2.2* 2.3* 2.2* 2.3*   ABG    Component Value Date/Time   PHART 7.266 (L) 12/08/2018 1405   PCO2ART 47.7 12/08/2018 1405   PO2ART 87.0 12/08/2018 1405   HCO3 21.7 12/08/2018 1405   TCO2 23 12/08/2018 1405   ACIDBASEDEF 5.0 (H) 12/08/2018 1405   O2SAT 95.0 12/08/2018 1405   CBG: Recent Labs  Lab 12/10/18 2326 12/11/18 0434 12/11/18 0720 12/11/18 1111 January 06, 2019 0341  GLUCAP 156* 174* 191* 211* 238*    CRITICAL CARE Critical care time: 31 minutes  Critical care time was exclusive of separately billable procedures and treating other patients. Critical care was necessary to treat or prevent imminent or life-threatening deterioration.  Critical care was time spent personally by me on the following activities: development of treatment plan with patient and/or surrogate as well as nursing,  discussions with consultants, evaluation of patient's response to treatment, examination of patient, obtaining history from patient or surrogate, ordering and performing treatments and interventions, ordering and review of laboratory studies, ordering and review of radiographic studies, pulse oximetry and re-evaluation of patient's condition.  Eliseo Gum MSN, AGACNP-BC [id number] If no answer, [id number] 01-06-19, 7:24 AM  Attending Note:  63 year old male with multiorgan  failure who presents with respiratory failure, renal failure, septic shock.  Overnight, no events.  On exam, patient is awake and interactive, states is ready to die and ready for comfort with coarse BS diffusely and high dose pressors.  I reviewed CXR myself, ETT is in a good position and infiltrate noted.  Discussed with PCCM-NP.  Spoke with wife.  She is prepared for withdrawal at this point but waiting for additional family to arrive.  Will place withdrawal orders signed and held and RN is to release when family is ready.  PCCM will continue to manage.  The patient is critically ill with multiple organ systems failure and requires high complexity decision making for assessment and support, frequent evaluation and titration of therapies, application of advanced monitoring technologies and extensive interpretation of multiple databases.   Critical Care Time devoted to patient care services described in this note is  36  Minutes. This time reflects time of care of this signee Dr Jennet Maduro. This critical care time does not reflect procedure time, or teaching time or supervisory time of PA/NP/Med student/Med Resident etc but could involve care discussion time.  Rush Farmer, M.D. [facility] Pulmonary/Critical Care Medicine. Pager: [phone]. After hours pager: [phone].

## 2018-12-29 NOTE — Progress Notes (Signed)
PCCM Brief Communication and plan update  I arrived at the bedside to speak with the patient's wife. I offered emotional support for this challenging situation.  We discussed the patient's pending liberation from the ventilator with transition to comfort care. Wife has requested that BiPAP remain available for comfort purposes to which PCCM MD has previously agreed. I clarified the wife's desire for BiPAP, inquiring if her desire is for the patient to be extubated directly to BiPAP or allowed time on room air without further ventilatory devices to be with wife. I discussed with the wife that the patient may experience respiratory failure with or without BiPAP. This is also likely to occur if patient is extubated to room air and later placed on BiPAP. Wife verbalizes understanding.   Wife has requested that we extubate patient to room air, but if she requests to please place pt on BiPAP for comfort.  -Continue comfort care measures to include pain management, secretion management, anxiolysis, ease of nausea and air hunger/dyspnea  -Extubate to room air -I have placed order for PRN BiPAP following extubation -No SpO2 goal on BiPAP-- this device would aid in comfort only -If the patient is placed on BiPAP but appears uncomfortable, BiPAP is to be discontinued.  -RN and RRT were present for this conversation and understand this plan     Eliseo Gum MSN, AGACNP-BC [id number] If no answer, [phone]/01/04, 3:31 PM

## 2018-12-29 NOTE — Progress Notes (Signed)
[facility] ROUNDING NOTE   Subjective:   This is a 63 year old gentleman with end-stage renal disease usually Tuesday Thursday Saturday dialysis.  He has a history of peripheral vascular status post left AKA right femoral fracture diabetes, morbid obesity hypertension hyperlipidemia.  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  It appears that he has chronic hypotension and has been on midodrine.  His home dialysis unit is [place] [facility].  He has limited functional status is mostly wheelchair/bedbound.  He has a history of a cystectomy and urostomy in the past.  His hospital course has been complicated by respiratory failure, this was thought to be secondary to volume overload in setting of ESRD and hypertension he underwent a right thoracentesis and required intubation on 12/05/2018.  It appears that he was extubated on 12/08/2018, he failed extubation and is now reintubated..  Due to his declining status he was placed on CRRT.  There have been ongoing discussions with the family in terms of withdrawal of dialytic support, it has been decided to extubate to terminal wean and DC CRRT today.  Blood pressure 124/35 pulse 93 temperature 100.3 O2 sats 98% FiO2 30  Sodium 136 potassium 4.4 chloride 101 CO2 20 BUN 17 creatinine 0.72 glucose 193 calcium 7.4 albumin 2.3 phosphorus 1.8 WBC 20.6 hemoglobin 8.0 platelets 102  IV vasopressin IV norepinephrine  Darbepoetin 200 mcg last dose 12/07/2018, Lantus 23 units daily subcu,  Repeat CT abdomen 12/09/2018 no substantial interval change.   Objective:  Vital signs in last 24 hours:  Temp:  [96.4 F (35.8 C)-100.3 F (37.9 C)] 100.3 F (37.9 C) (10/15 0726) Pulse Rate:  [84-117] 95 (10/15 0600) Resp:  [14-24] 20 (10/15 0600) BP: (85-129)/(10-41) 123/24 (10/15 0600) SpO2:  [93 %-100 %] 98 % (10/15 0600) FiO2 (%):  [30 %-40 %] 30 % (10/15 0600) Weight:  [150 kg] 117 kg  (10/15 0346)  Weight change: 0.5 kg Filed Weights   12/10/18 0305 12/11/18 0440 01/08/19 0346  Weight: 118.1 kg 116.5 kg 117 kg    Intake/Output: I/O last 3 completed shifts: In: 3801.9 [I.V.:3055; NG/GT:670; IV Piggyback:76.9] Out: 5697 [Other:3539]   Intake/Output this shift:  No intake/output data recorded.  General: Obese, chronically ill appearing male  Heart: S1,S2 RRR Lungs:   sounds bilaterally Abdomen: Active BS, obese, NT, subcutaneous bruising  Extremities: L AKA with 2+ stump edema. RLE 1+ edema, cool leg, mottling, dusky in appearance. Dialysis Access: R AVF + bruit, aneurysmal SKIN: large stage IV sacral decub, , L arm with + redness and weeping and several ulcerations of skin.     Basic Metabolic Panel: Recent Labs  Lab 12/05/18 1118  12/07/18 0352  12/09/18 0344 12/09/18 1723 12/10/18 0611 12/10/18 1706 12/11/18 0343  NA 134*   < > 135   < > 136 136 136 136 136  K 4.7   < > 4.0   < > 4.4 4.2 4.5 4.4 4.4  CL 99   < > 100   < > 104 102 99 103 101  CO2 27   < > 24   < > 21* 21* 24 23 20*  GLUCOSE 113*   < > 176*   < > 194* 185* 206* 152* 193*  BUN 21   < > 26*   < > _0 CREATININE 2.00*   < > 1.76*   < > 1.06 1.02 0.90 0.81 0.72  CALCIUM 8.3*   < > 7.8*   < > 7.0* 7.2* 7.5* 7.2* 7.4*  MG 1.9  --  2.0  --  2.1  --  2.2  --  2.2  PHOS 2.9   < > 1.4*   < > 1.2* 2.0* 1.6* 2.3* 1.8*   < > = values in this interval not displayed.    Liver Function Tests: Recent Labs  Lab 12/09/18 0344 12/09/18 1723 12/10/18 0611 12/10/18 1706 12/11/18 0343  ALBUMIN 2.2* 2.2* 2.3* 2.2* 2.3*   No results for input(s): LIPASE, AMYLASE in the last 168 hours. No results for input(s): AMMONIA in the last 168 hours.  CBC: Recent Labs  Lab 12/07/18 0352 12/08/18 1136 12/08/18 1405 12/09/18 0344 12/10/18 0611 12/11/18 0343  WBC 18.6* 26.1*  --  36.5* 22.7* 20.6*  NEUTROABS  --  23.5*  --  33.2*  --   --   HGB 8.2* 7.6* 10.2* 8.3* 8.7* 8.0*  HCT 29.6*  29.3* 30.0* 30.6* 31.6* 30.3*  MCV 90.8 95.8  --  91.9 92.4 94.4  PLT 133* 88*  --  100* 84* 102*    Cardiac Enzymes: No results for input(s): CKTOTAL, CKMB, CKMBINDEX, TROPONINI in the last 168 hours.  BNP: Invalid input(s): POCBNP  CBG: Recent Labs  Lab 12/11/18 0434 12/11/18 0720 12/11/18 1111 2018/12/26 0341 December 26, 2018 0724  GLUCAP 174* 191* 211* 238* 231*    Microbiology: Results for orders placed or performed during the hospital encounter of 11/23/2018  SARS CORONAVIRUS 2 (TAT 6-24 HRS) Nasopharyngeal Nasopharyngeal Swab     Status: None   Collection Time: 11/12/2018  3:10 PM   Specimen: Nasopharyngeal Swab  Result Value Ref Range Status   SARS Coronavirus 2 NEGATIVE NEGATIVE Final    Comment: (NOTE) SARS-CoV-2 target nucleic acids are NOT DETECTED. The SARS-CoV-2 RNA is generally detectable in upper and lower respiratory specimens during the acute phase of infection. Negative results do not preclude SARS-CoV-2 infection, do not rule out co-infections with other pathogens, and should not be used as the sole basis for treatment or other patient management decisions. Negative results must be combined with clinical observations, patient history, and epidemiological information. The expected result is Negative. Fact Sheet for Patients: SugarRoll.be Fact Sheet for Healthcare Providers: https://www.woods-mathews.com/ This test is not yet approved or cleared by the [place] FDA and  has been authorized for detection and/or diagnosis of SARS-CoV-2 by FDA under an Emergency Use Authorization (EUA). This EUA will remain  in effect (meaning this test can be used) for the duration of the COVID-19 declaration under Section 56 4(b)(1) of the Act, 21 U.S.C. section 360bbb-3(b)(1), unless the authorization is terminated or revoked sooner. Performed at [facility], [place] [address], [place], [place] [address]   Culture, blood  (routine x 2)     Status: None   Collection Time: 12/05/18 11:20 AM   Specimen: BLOOD LEFT HAND  Result Value Ref Range Status   Specimen Description BLOOD LEFT HAND  Final   Special Requests   Final    BOTTLES DRAWN AEROBIC ONLY Blood Culture results may not be optimal due to an inadequate volume of blood received in culture bottles   Culture   Final    NO GROWTH 5 DAYS Performed at [facility], [place] [address], [place], [place] [address]    Report Status 12/10/2018 FINAL  Final  Culture, blood (routine x 2)     Status: None   Collection Time: 12/05/18 11:40  AM   Specimen: BLOOD LEFT ARM  Result Value Ref Range Status   Specimen Description BLOOD LEFT ARM  Final   Special Requests   Final    BOTTLES DRAWN AEROBIC ONLY Blood Culture results may not be optimal due to an inadequate volume of blood received in culture bottles   Culture   Final    NO GROWTH 5 DAYS Performed at [facility], [place] [address], [place], [place] [address]    Report Status 12/10/2018 FINAL  Final  Gram stain     Status: None   Collection Time: 12/06/18  4:19 PM   Specimen: PATH Cytology Pleural fluid  Result Value Ref Range Status   Specimen Description FLUID PLEURAL  Final   Special Requests NONE  Final   Gram Stain   Final    WBC PRESENT,BOTH PMN AND MONONUCLEAR NO ORGANISMS SEEN CYTOSPIN SMEAR Performed at [facility], [address], [place], [place] [address]    Report Status 12/06/2018 FINAL  Final  Culture, body fluid-bottle     Status: None (Preliminary result)   Collection Time: 12/06/18  4:19 PM   Specimen: Fluid  Result Value Ref Range Status   Specimen Description FLUID PLEURAL  Final   Special Requests NONE  Final   Culture   Final    NO GROWTH 4 DAYS Performed at [place] [address], [place], [place] [address]    Report Status PENDING  Incomplete  Culture, respiratory (non-expectorated)     Status: None (Preliminary result)   Collection Time:  12/08/18  8:00 AM   Specimen: Tracheal Aspirate; Respiratory  Result Value Ref Range Status   Specimen Description TRACHEAL ASPIRATE  Final   Special Requests NONE  Final   Gram Stain   Final    ABUNDANT WBC PRESENT, PREDOMINANTLY PMN ABUNDANT GRAM NEGATIVE RODS ABUNDANT GRAM POSITIVE COCCI IN CLUSTERS FEW YEAST FEW GRAM POSITIVE RODS    Culture   Final    CULTURE REINCUBATED FOR BETTER GROWTH Performed at [facility], [place] [address], [place], [place] [address]    Report Status PENDING  Incomplete    Coagulation Studies: No results for input(s): LABPROT, INR in the last 72 hours.  Urinalysis: No results for input(s): COLORURINE, LABSPEC, PHURINE, GLUCOSEU, HGBUR, BILIRUBINUR, KETONESUR, PROTEINUR, UROBILINOGEN, NITRITE, LEUKOCYTESUR in the last 72 hours.  Invalid input(s): APPERANCEUR    Imaging: No results found.   Medications:   . dexmedetomidine (PRECEDEX) IV infusion 1.1 mcg/kg/hr (2018-12-13 0600)  . fentaNYL infusion INTRAVENOUS 200 mcg/hr (12/13/2018 0600)  . norepinephrine (LEVOPHED) Adult infusion 20 mcg/min (2018/12/13 0600)  . vasopressin (PITRESSIN) infusion - *FOR SHOCK* 0.04 Units/min (Dec 13, 2018 0600)   . chlorhexidine gluconate (MEDLINE KIT)  15 mL Mouth Rinse BID  . Chlorhexidine Gluconate Cloth  6 each Topical Q0600  . collagenase   Topical Daily  . mouth rinse  15 mL Mouth Rinse 10 times per day   acetaminophen **OR** acetaminophen, fentaNYL, midazolam, morphine injection, ondansetron (ZOFRAN) IV, oxyCODONE  Assessment/ Plan:   ESRD hemodialysis Tuesday Thursday Saturday last dialysis was completed 12/03/2018 has now been changed transition to CRRT 12/06/2018 for volume removal.  He is getting heparin through CRRT due to clotting of filter.  Hypoxic respiratory failure intubated status post thoracentesis with transudate.  Failed extubation 12/08/2018  Stage IV decubitus limited mobility wheelchair/bedbound status post bedside debridement  hydrotherapy antibiotics discontinued 12/10/2018  Anemia Aranesp is being increased to 200 mcg 12/07/2018  Shock  with hemodynamic instability requiring pressors vasopressin and Levophed  Hypophosphatemia 1.8 will replete with IV phosphorus  Atrial fibrillation with rapid rate IV amiodarone  Nutrition Albumin low protein support  Diabetes insulin per primary  Status post cystectomy and urostomy   Disposition unfortunate situation with very little chance of recovery will aim to discontinue CRRT in a.m.  I support decision of Dr. Nelda Marseille  Hypotension not really tolerating much in terms of fluid removal despite being on CRRT we will continue to follow     LOS: [place] _0 _1 :34 AM

## 2018-12-29 NOTE — Death Summary Note (Signed)
DEATH SUMMARY   Patient Details  Name: [name] MRN: [id number] DOB: [birth year]  Admission/Discharge Information   Admit Date:  12/10/18  Date of Death: Date of Death: 2018-12-28  Time of Death: Time of Death: 1640/06/07  Length of Stay: May 31, 2022  Referring Physician: [place]   Reason(s) for Hospitalization  Acute hypoxemic respiratory failure  Diagnoses  Preliminary cause of death:   Acute hypoxemic respiratory failure  Secondary Diagnoses (including complications and co-morbidities):  Active Problems:   Type 2 diabetes mellitus with diabetic nephropathy, with long-term current use of insulin (HCC)   OBESITY   Essential hypertension   End stage renal disease (HCC)   Chronic venous insufficiency   Unstageable pressure ulcer of right buttock (HCC)   Closed fracture of distal end of right femur (HCC)   S/P AKA (above knee amputation) unilateral, left (HCC)   Decubitus ulcer of sacral region, stage 4 ([place])   DNR (do not resuscitate) discussion   Chronic respiratory failure with hypoxia ([place])   Adult failure to thrive syndrome   Palliative care encounter   Brief Hospital Course (including significant findings, care, treatment, and services provided and events leading to death)  Patient is a 63-year male with history of ESRD on HD, peripheral vascular disease s/p left AKA, R femur fx, diabetes, morbid obesity, HTN, HLD who presented with infected sacral ulcer. Patient is largely bedbound and sometimes ambulates with a wheelchair. He reported developing a small ulcer about 4 weeks ago which has now progressed to stage IV sacral decubitus ulcer. There was no report of fever or chills at home. Patient was admitted for the management of decubitus ulcer.  PCCM was consulted for repeated respiratory failure and MODS  Spoke with wife.  She is prepared for withdrawal at this point but waiting for additional family to arrive.  Will place withdrawal orders signed and held  and RN is to release when family is ready.  Wife arrived, Eliseo Gum, NP spoke with the wife.  She is ready for withdrawal and the orders were released by bedside RN and patient was extubated to expire shortly thereafter comfortable with the family bedside.    Pertinent Labs and Studies  Significant Diagnostic Studies Ct Abdomen Pelvis Wo Contrast  Result Date: 12/09/2018 CLINICAL DATA:  Status post hepatic abscess drain. Sepsis. EXAM: CT ABDOMEN AND PELVIS WITHOUT CONTRAST TECHNIQUE: Multidetector CT imaging of the abdomen and pelvis was performed following the standard protocol without IV contrast. COMPARISON:  11/27/2018 FINDINGS: Lower chest: Bibasilar collapse/consolidation noted, right greater than left with some improvement in lingular aeration since prior. Bilateral pleural effusions, right greater than left, again noted. Hepatobiliary: The small fluid collection identified previously adjacent to the posterior right liver is stable to minimally smaller, measuring 1.9 x 3.3 cm today on axial imaging which compares to 1.8 x 3.8 cm previously. Gallbladder surgically absent. No intrahepatic or extrahepatic biliary dilation. Pancreas: No focal mass lesion. No dilatation of the main duct. No intraparenchymal cyst. No peripancreatic edema. Spleen: No splenomegaly. No focal mass lesion. Adrenals/Urinary Tract: No adrenal nodule or mass. Right renal parenchyma markedly atrophic with multiple renal cysts and/or dilated calices. Lower pole right renal stone is similar to prior. Similar appearance of marked left renal atrophy with cystic change. Ureters remain prominent with periureteric ill-defined soft tissue potentially related to edema or inflammation. Patient is status post cystectomy with right lower quadrant ileal diversion. Stomach/Bowel: NG tube tip is in the gastric antrum. Stomach largely decompressed. Duodenum is  normally positioned as is the ligament of Treitz. Distal duodenal diverticulum  evident. No small bowel wall thickening. No small bowel dilatation. The terminal ileum is normal. Appendix similar to prior without substantial edema or inflammation. Left colonic diverticulosis noted. The sigmoid colon is elongated and redundant and extends out into a right groin hernia. No colonic dilatation proximal to this hernia to suggest obstruction. There is some fluid in the hernia sac. Much of the inferior pelvis is obscured by beam hardening artifact from bilateral hip replacement. Vascular/Lymphatic: There is abdominal aortic atherosclerosis without aneurysm. Mild hepato duodenal ligament lymphadenopathy is stable. Mild para-aortic abdominal lymphadenopathy is stable. Index aortocaval node on 38/9 measures 1.3 cm short axis. Left para-aortic node measures 1.2 cm short axis on 36/9. There is mild common iliac lymphadenopathy and pelvic sidewall lymphadenopathy bilaterally, similar to prior. Although partially obscured pine a right common iliac node measures 2 cm short axis on 83/9, similar to prior. Lymphadenopathy in the groin regions bilaterally is similar to prior. Reproductive: Obscured by beam hardening artifact. Other: No intraperitoneal free fluid. Musculoskeletal: Left groin hernia contains only fat. Diffuse body wall edema noted. Bilateral hip replacement. Bones are diffusely demineralized. Midline wound noted lower anterior abdominal wall. IMPRESSION: 1. No substantial interval change in exam. The small fluid collection adjacent to the posterior right liver is stable to minimally smaller in the interval. 2. Stable bilateral pleural effusions with bibasilar collapse/consolidation, right greater than left. 3. 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 4. Persistent periureteric soft tissue potentially related to edema or inflammation without overt hydroureteronephrosis. Patient is status post cystectomy with ileal diversion. 5. Right groin hernia contains sigmoid colon with  some fluid in the hernia sac. Imaging features are similar to prior and there is no colonic dilatation proximal to the hernia to suggest obstruction. 6. Left groin hernia contains only fat. 7. Abdominal aortic atherosclerosis. 8. Diffuse body wall edema. Aortic Atherosclerosis (ICD10-I70.0). Electronically Signed   By: Misty Stanley M.D.   On: 12/09/2018 16:56   Dg Chest 1 View  Result Date: 12/07/2018 CLINICAL DATA:  Check ET tube/ OG tube EXAM: CHEST  1 VIEW COMPARISON:  Chest radiograph 12/06/2018, 12/05/2018 FINDINGS: Stable enlarged cardiomediastinal contours. Endotracheal tube tip projects to 20 in the thoracic inlet and carina. Right central venous catheter tip projects over the distal SVC. Nasogastric courses below the diaphragm out of field of view. There are scattered bilateral linear opacities favored to represent atelectasis. No new focal infiltrate. No pneumothorax or large pleural effusion. IMPRESSION: 1.  Stable support apparatus. 2. Scattered bilateral atelectasis. No significant pleural effusion. Electronically Signed   By: Audie Pinto M.D.   On: 12/07/2018 19:54   Dg Chest 1 View  Result Date: 12/06/2018 CLINICAL DATA:  63 year old male central line placement. EXAM: CHEST  1 VIEW COMPARISON:  12/05/2018 and earlier. FINDINGS: Portable AP semi upright view at 1150 hours. The patient is mildly rotated to the right. Right IJ approach catheter tip projects at the level of the carina, SVC. ET tube in good position just below the clavicles. Enteric tube courses to the left upper quadrant and is likely stable from yesterday. Confluent right greater than left lung base opacity has probably not significantly changed from the CTA yesterday. Stable cardiac size and mediastinal contours. IMPRESSION: 1. Right IJ approach venous catheter tip at the level of the SVC. No pneumothorax. 2. Otherwise stable chest from the CT yesterday with right greater than left lung base opacification. Electronically  Signed  By: Genevie Ann M.D.   On: 12/06/2018 12:19   Dg Chest 1 View  Result Date: 12/05/2018 CLINICAL DATA:  Shortness of breath, intubation EXAM: CHEST  1 VIEW COMPARISON:  12/03/2018 FINDINGS: Interval endotracheal intubation, tip positioned over the mid trachea. Low volume examination with extensive, new perihilar heterogeneous airspace opacity of the right lung and an unchanged focal opacity of the peripheral left lower lung. Cardiomegaly, this appearance likely exaggerated by low volume technique. IMPRESSION: 1. Interval endotracheal intubation, tip positioned over the mid trachea. 2. Low volume examination with extensive, new perihilar heterogeneous airspace opacity of the right lung, which may reflect asymmetric edema or infection, and an unchanged focal opacity of the peripheral left lower lung. 3. Cardiomegaly, this appearance likely exaggerated by low volume technique. Electronically Signed   By: Eddie Candle M.D.   On: 12/05/2018 10:10   Dg Sacrum/coccyx  Result Date: 11/26/2018 CLINICAL DATA:  Sacral decubitus ulcer, stage IV, pain about the sacrum. EXAM: SACRUM AND COCCYX - 2+ VIEW COMPARISON:  CT 09/26/2018 FINDINGS: AP radiographs are suboptimal due to patient rotation. Furthermore there is limited evaluation of the sacrum and coccyx due to patient body habitus and diffuse bony demineralization. No gross abnormalities identified though certainly nondisplaced fractures may be present. 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 Vascular calcium is noted in the pelvis. There is soft tissue a ulceration noted posteriorly to the sacrum which appears to extend to the level of the bone. IMPRESSION: Significantly degraded images of the sacrum and coccyx due to patient body habitus, motion and diffuse bony demineralization. Sacral ulceration extends to the level of the bone however evaluation is markedly limited. Consider contrast-enhanced cross-sectional imaging for further evaluation of the  sacral decubitus ulcer. Electronically Signed   By: Lovena Le M.D.   On: 11/26/2018 22:39   Dg Abd 1 View  Result Date: 12/05/2018 CLINICAL DATA:  NG tube placement. EXAM: ABDOMEN - 1 VIEW COMPARISON:  CT scan 11/27/2018 FINDINGS: There is an NG tube coursing down the esophagus and into the stomach. The tip is in the body region. The endotracheal tube is in good position, 3.7 cm above the carina. Persistent bilateral infiltrates. IMPRESSION: NG tube tip is in the body region of the stomach. Electronically Signed   By: Marijo Sanes M.D.   On: 12/05/2018 12:49   Ct Angio Chest Pe W Or Wo Contrast  Result Date: 12/05/2018 CLINICAL DATA:  63 year old male with recent cardiac arrest. Dialysis. EXAM: CT ANGIOGRAPHY CHEST WITH CONTRAST TECHNIQUE: Multidetector CT imaging of the chest was performed using the standard protocol during bolus administration of intravenous contrast. Multiplanar CT image reconstructions and MIPs were obtained to evaluate the vascular anatomy. CONTRAST:  50mL OMNIPAQUE IOHEXOL 350 MG/ML SOLN COMPARISON:  Portable chest today. CTA chest 11/15/2007. CT Abdomen and Pelvis 11/27/2018. FINDINGS: Cardiovascular: Good contrast bolus timing in the pulmonary arterial tree. No focal filling defect identified in the pulmonary arteries to suggest acute pulmonary embolism. Calcified coronary artery atherosclerosis. Little contrast in the aorta today. Mild cardiomegaly without pericardial effusion. Mediastinum/Nodes: Enteric tube courses in the esophagus to the abdomen. No lymphadenopathy. Lungs/Pleura: Intubated, ET tube terminates above the carina. Atelectatic changes on the carina and other major airways which do remain patent. Increased bilateral pleural effusions, with a mix of layering and sub pulmonic fluid. Chronic right lower lobe consolidation. New left lower lobe consolidation. There is confluent peribronchial opacity in the anterior right upper lobe, with superimposed posterior right  upper lobe consolidation. The left upper  lobe is relatively spared but there is patchy perihilar opacity on the left. Linear opacity in the lingula more resembles atelectasis. The right middle lobe is relatively spared. Upper Abdomen: Enteric tube terminates in the proximal gastric body. There is a small 9 millimeter square or round object adjacent to the tube which seems to be an ingested foreign body on series 5, image 110. Otherwise stable visible upper abdomen from the recent CT Abdomen and Pelvis on 11/27/2018. Musculoskeletal: Osteopenia and widespread ankylosis in the spine. Scoliosis. Partially visible right upper extremity vascular stent or graft. No acute osseous abnormality identified. Review of the MIP images confirms the above findings. IMPRESSION: 1. No evidence of acute pulmonary embolus. 2. Increased bilateral pulmonary consolidation and pleural effusions since 11/27/2018. Bilateral Pneumonia strongly suspected, although some of the affected lung might be simply atelectatic. 3. Enteric tube courses to the stomach and there appears to be a small ingested 9 mm foreign body adjacent to the tube on series 5, image 110. 4. ET tube terminates above the carina with atelectatic changes to the more distal airways. Electronically Signed   By: Genevie Ann M.D.   On: 12/05/2018 13:32   Ct Abdomen Pelvis W Contrast  Result Date: 11/27/2018 CLINICAL DATA:  Acute generalized abdominal pain, recent hospitalization with perihepatic abscess requiring drain placement in antibiotic EXAM: CT ABDOMEN AND PELVIS WITH CONTRAST TECHNIQUE: Multidetector CT imaging of the abdomen and pelvis was performed using the standard protocol following bolus administration of intravenous contrast. CONTRAST:  123mL OMNIPAQUE IOHEXOL 300 MG/ML  SOLN COMPARISON:  Numerous priors, most recent comparison 09/26/2018 FINDINGS: Portions of the abdomen are excluded from the level of imaging due to patient's size/body habitus. Lower chest: New  consolidation in the left lower lobe with air bronchograms. Persistent atelectatic collapse of the right lower lobe. Moderate right and small left pleural effusions with adjacent passive atelectasis. Redemonstrated cardiomegaly. Extensive coronary artery calcification is again seen. Hepatobiliary: Small portion of the right heart border is beyond the level of collimation. No focal liver lesion is seen. There is been interval removal of the previously demonstrated pigtail pleural drain with a small residual collection along the posterior border of the right lobe liver measuring up to 1.8 x 3.8 x 2.5 cm in size and demonstrating a small amount of peripheral rim enhancement (axial 35, coronal 68). Patient is post cholecystectomy. Extent biliary ductal dilatation is similar to prior exam and likely related to the a combination of age and reservoir effect. Pancreas: Unremarkable. No pancreatic ductal dilatation or surrounding inflammatory changes. Spleen: Borderline splenomegaly, no splenic lesions. Adrenals/Urinary Tract: Adrenal glands are unremarkable. There is severe bilateral renal atrophy with bilateral renal cysts, similar to prior. Nonobstructive nephrolithiasis in the lower pole right kidney is similar to prior. There is persistent periureteral stranding and urothelial thickening similar to slightly increased from comparison exams. Patient is post cystectomy and ileal conduit diversion. Mild stranding is seen adjacent the ileal conduit. Stomach/Bowel: Distal esophagus, stomach and duodenal sweep are unremarkable. No bowel wall thickening or dilatation. No evidence of obstruction. Appendix is present in the right lower quadrant with a stable amount of fluid adjacent likely tracking from the paracolic gutter. No discrete periappendiceal inflammation. Portion of the colon is partially collimated from view. No visible colonic thickening or dilatation. Scattered colonic diverticula without focal pericolonic  inflammation to suggest diverticulitis. The distal sigmoid protrudes into a bowel containing right inguinal hernia. No resulting obstruction. Vascular/Lymphatic: Atherosclerotic plaque within the normal caliber aorta. Reactive nodes are present  in the mesentery and retroperitoneum not significantly changed from recent comparison studies. Reproductive: Pelvis is largely obscured by streak artifact from patient's bilateral hip arthroplasties. Few coarse prostatic calcifications are visualized, similar to prior. Other: Extensive abdominal wall laxity. Circumferential body wall edema, similar to prior. Large fat and bowel containing right inguinal hernia. Additional large fat and fluid containing left inguinal hernia as well. Musculoskeletal: Bilateral total hip arthroplasties. Severe degenerative changes in the spine with multilevel compression deformities similar to prior studies no definite acute osseous abnormality is evident. Diffuse atrophy of the abdominal wall and pelvic musculature. IMPRESSION: 1. Portions of the abdomen are excluded from the level of imaging due to patient's size/body habitus. 2. Interval removal of the previously demonstrated pigtail pleural drain with a small residual collection along the posterior border of the right lobe liver measuring up to 1.8 x 3.8 x 2.5 cm in size, with a small amount of peripheral rim enhancement. Findings are concerning for residual abscess or infection. 3. Persistent periureteral stranding and urothelial thickening, similar to slightly increased from prior exams. Correlate with urinalysis to exclude ascending infection. 4. The distal sigmoid protrudes into a bowel containing right inguinal hernia. No resulting obstruction or CT evidence of vascular compromise. 5. New consolidation in the left lower lobe with air bronchograms, could reflect pneumonia or sequela of aspiration. 6. Moderate right and small left pleural effusions with adjacent passive atelectasis. 7.  Worsening body wall edema, compatible with anasarca. 8. Aortic Atherosclerosis (ICD10-I70.0). Electronically Signed   By: Lovena Le M.D.   On: 11/27/2018 02:05   Dg Chest Port 1 View  Result Date: 12/10/2018 CLINICAL DATA:  Pleural effusion EXAM: PORTABLE CHEST 1 VIEW COMPARISON:  Abdominal CT from yesterday FINDINGS: Endotracheal tube tip at the clavicular heads. Right IJ line with tip at the upper SVC. The orogastric tube reaches the distal stomach. Cardiomegaly with hazy opacity at the right more than left lung base. This is pneumonia and atelectasis with trace pleural effusions by recent CT. IMPRESSION: 1. Stable hardware positioning. 2. Stable bilateral pulmonary opacification with atelectasis, pneumonia, and trace effusions by recent CT. Electronically Signed   By: Monte Fantasia M.D.   On: 12/10/2018 07:48   Dg Chest Port 1 View  Result Date: 12/08/2018 CLINICAL DATA:  Intubation. EXAM: PORTABLE CHEST 1 VIEW COMPARISON:  Same day. FINDINGS: Stable cardiomegaly. Endotracheal and nasogastric tubes are unchanged in position. Right internal jugular catheter is unchanged. No pneumothorax is noted. Increased bilateral perihilar and basilar opacities are noted concerning for worsening edema or atelectasis. Small right pleural effusion cannot be excluded. Bony thorax is unremarkable. IMPRESSION: Stable support apparatus. Increased bilateral perihilar and basilar opacities are noted concerning for worsening edema or atelectasis. Electronically Signed   By: Marijo Conception M.D.   On: 12/08/2018 14:11   Dg Chest Port 1 View  Result Date: 12/08/2018 CLINICAL DATA:  Respiratory failure. EXAM: PORTABLE CHEST 1 VIEW COMPARISON:  December 07, 2018. FINDINGS: Stable cardiomegaly. Endotracheal and nasogastric tubes are unchanged in position. Right internal jugular catheter is unchanged. No pneumothorax or significant pleural effusion is noted. Minimal bibasilar subsegmental atelectasis is noted. Bony thorax  is unremarkable. IMPRESSION: Stable support apparatus. Minimal bibasilar subsegmental atelectasis. Electronically Signed   By: Marijo Conception M.D.   On: 12/08/2018 08:16   Dg Chest Port 1 View  Result Date: 12/06/2018 CLINICAL DATA:  Status post right thoracentesis today. EXAM: PORTABLE CHEST 1 VIEW COMPARISON:  Single-view of the chest earlier today. FINDINGS: Support tubes and  lines are unchanged. Right pleural effusion is markedly decreased after thoracentesis. Negative for pneumothorax. Subsegmental atelectasis in the bases noted. There is cardiomegaly. IMPRESSION: Marked decrease in right pleural effusion after thoracentesis. Negative for pneumothorax. Bibasilar subsegmental atelectasis. Electronically Signed   By: Inge Rise M.D.   On: 12/06/2018 17:28   Dg Chest Port 1 View  Result Date: 12/03/2018 CLINICAL DATA:  63 year old with acute chest pain and shortness of breath. Current history of diabetes and hypertension. Current history of end-stage renal disease. EXAM: PORTABLE CHEST 1 VIEW COMPARISON:  11/11/2018 and earlier. FINDINGS: Suboptimal inspiration accounts for crowded bronchovascular markings, especially in the bases, and accentuates the cardiac silhouette. Taking this into account, cardiac silhouette moderately enlarged. Pulmonary venous hypertension without overt edema currently. Atelectasis in the LEFT lung base. Small RIGHT pleural effusions suspected with passive atelectasis in the RIGHT LOWER LOBE. Widening of the superior mediastinum, not significantly changed since July, 2020 but increased since February, 2020. IMPRESSION: 1. Suboptimal inspiration accounts for atelectasis in the LEFT lung base. 2. Small RIGHT pleural effusion suspected with passive atelectasis in the RIGHT LOWER LOBE. 3. Stable cardiomegaly without pulmonary edema. 4. Widening of the superior mediastinum, unchanged since July, 2020 but increased since February, 2020. When the patient is clinically stable, CT  of the chest with contrast is recommended in further evaluation. Electronically Signed   By: Evangeline Dakin M.D.   On: 12/03/2018 19:54   Dg Chest Port 1 View  Result Date: 10/29/2018 CLINICAL DATA:  Hypoxia. EXAM: PORTABLE CHEST 1 VIEW COMPARISON:  Multiple chest x-rays since March 30, 2018 FINDINGS: No pneumothorax. Stable cardiomegaly. Opacity in the left base is more pronounced in the interval and somewhat platelike. No overt edema. No other acute abnormalities. IMPRESSION: There is opacity in the left base which is more pronounced in the interval. The somewhat platelike appearance suggests the possibility of atelectasis rather than pneumonia. Recommend clinical correlation and short-term follow-up to ensure resolution. Electronically Signed   By: Dorise Bullion III M.D   On: 11/21/2018 15:38   US Thoracentesis Asp Pleural Space W/img Guide  Result Date: 12/06/2018 INDICATION: Respiratory failure. Right-sided pleural effusion. Request for diagnostic and therapeutic thoracentesis. EXAM: ULTRASOUND GUIDED RIGHT THORACENTESIS MEDICATIONS: None. COMPLICATIONS: None immediate. PROCEDURE: An ultrasound guided thoracentesis was thoroughly discussed with the patient and questions answered. The benefits, risks, alternatives and complications were also discussed. The patient understands and wishes to proceed with the procedure. Written consent was obtained. Ultrasound was performed to localize and mark an adequate pocket of fluid in the right chest. The area was then prepped and draped in the normal sterile fashion. 1% Lidocaine was used for local anesthesia. Under ultrasound guidance a 6 Fr Safe-T-Centesis catheter was introduced. Thoracentesis was performed. The catheter was removed and a dressing applied. FINDINGS: A total of approximately 580 mL of clear yellow fluid was removed. Samples were sent to the laboratory as requested by the clinical team. IMPRESSION: Successful ultrasound guided right  thoracentesis yielding 580 mL of pleural fluid. Read by: Ascencion Dike PA-C Electronically Signed   By: Corrie Mckusick D.O.   On: 12/06/2018 16:31    Microbiology Recent Results (from the past 240 hour(s))  Culture, respiratory (non-expectorated)     Status: None (Preliminary result)   Collection Time: 12/08/18  8:00 AM   Specimen: Tracheal Aspirate; Respiratory  Result Value Ref Range Status   Specimen Description TRACHEAL ASPIRATE  Final   Special Requests NONE  Final   Gram Stain   Final  ABUNDANT WBC PRESENT, PREDOMINANTLY PMN ABUNDANT GRAM NEGATIVE RODS ABUNDANT GRAM POSITIVE COCCI IN CLUSTERS FEW YEAST FEW GRAM POSITIVE RODS    Culture   Final    MODERATE KLEBSIELLA PNEUMONIAE SUSCEPTIBILITIES TO FOLLOW Performed at [facility], [place] [address], [place], [place] [address]    Report Status PENDING  Incomplete    Lab Basic Metabolic Panel: Recent Labs  Lab 12/10/18 1706 12/11/18 0343  NA 136 136  K 4.4 4.4  CL 103 101  CO2 23 20*  GLUCOSE 152* 193*  BUN 19 17  CREATININE 0.81 0.72  CALCIUM 7.2* 7.4*  MG  --  2.2  PHOS 2.3* 1.8*   Liver Function Tests: Recent Labs  Lab 12/10/18 1706 12/11/18 0343  ALBUMIN 2.2* 2.3*   No results for input(s): LIPASE, AMYLASE in the last 168 hours. No results for input(s): AMMONIA in the last 168 hours. CBC: Recent Labs  Lab 12/11/18 0343  WBC 20.6*  HGB 8.0*  HCT 30.3*  MCV 94.4  PLT 102*   Cardiac Enzymes: No results for input(s): CKTOTAL, CKMB, CKMBINDEX, TROPONINI in the last 168 hours. Sepsis Labs: Recent Labs  Lab 12/11/18 0343  WBC 20.6*    Procedures/Operations     Jennet Maduro 12/17/2018, 10:43 AM

## 2018-12-29 NOTE — Progress Notes (Addendum)
Patient refused sacral dressing change. Became agitated and resistant to be turned. Patient educated.

## 2018-12-29 NOTE — Procedures (Signed)
Extubation Procedure Note  Patient Details:   Name: [name] DOB: 03/03/1955 MRN: [id number]   Airway Documentation:    Vent end date: 12-28-2018 Vent end time: 1530   Evaluation  O2 sats: currently acceptable no sat goal, pt initially extubated to room air.   Complications: No apparent complications Patient did tolerate procedure well. Bilateral Breath Sounds: Diminished, Clear   All questions answered prior to extubation.   Yes pt able to speak and nod head to answer questions. Pt appeared comfortable & not distress noted s/p extubation, but pt nodded head "yes" when wife asked if he felt bad, wife then asked for bipap to be placed to help with comfort.    Pt appears to be tol bipap well currently.  Per discussion w/ CCM- bipap can be on 21% fio2 & no sat goal.  RN at bedside t/o procedure.   Lenna Sciara 28-Dec-2018, 3:46 PM

## 2018-12-29 DEATH — deceased

## 2019-01-03 DIAGNOSIS — Z66 Do not resuscitate: Secondary | ICD-10-CM
